# Patient Record
Sex: Female | Born: 1955 | Race: Black or African American | Hispanic: No | Smoking: Former smoker
Health system: Southern US, Community
[De-identification: ages and names within clinical notes are randomized; demographics above are authoritative.]

## PROBLEM LIST (undated history)

## (undated) DIAGNOSIS — G4733 Obstructive sleep apnea (adult) (pediatric): Secondary | ICD-10-CM

## (undated) DIAGNOSIS — R0600 Dyspnea, unspecified: Secondary | ICD-10-CM

## (undated) DIAGNOSIS — I5022 Chronic systolic (congestive) heart failure: Secondary | ICD-10-CM

## (undated) DIAGNOSIS — E079 Disorder of thyroid, unspecified: Secondary | ICD-10-CM

## (undated) DIAGNOSIS — E119 Type 2 diabetes mellitus without complications: Secondary | ICD-10-CM

## (undated) DIAGNOSIS — I10 Essential (primary) hypertension: Secondary | ICD-10-CM

## (undated) DIAGNOSIS — K1379 Other lesions of oral mucosa: Secondary | ICD-10-CM

## (undated) DIAGNOSIS — M1990 Unspecified osteoarthritis, unspecified site: Secondary | ICD-10-CM

## (undated) DIAGNOSIS — I447 Left bundle-branch block, unspecified: Secondary | ICD-10-CM

## (undated) DIAGNOSIS — I2510 Atherosclerotic heart disease of native coronary artery without angina pectoris: Secondary | ICD-10-CM

## (undated) HISTORY — DX: Type 2 diabetes mellitus without complications: E11.9

## (undated) HISTORY — PX: THYROID SURGERY: SHX805

## (undated) HISTORY — PX: ABDOMINAL HYSTERECTOMY: SHX81

---

## 1999-08-06 ENCOUNTER — Emergency Department (HOSPITAL_COMMUNITY): Admission: EM | Admit: 1999-08-06 | Discharge: 1999-08-06 | Payer: Self-pay | Admitting: Emergency Medicine

## 1999-08-21 ENCOUNTER — Encounter: Payer: Self-pay | Admitting: Emergency Medicine

## 1999-08-21 ENCOUNTER — Emergency Department (HOSPITAL_COMMUNITY): Admission: EM | Admit: 1999-08-21 | Discharge: 1999-08-21 | Payer: Self-pay | Admitting: Emergency Medicine

## 1999-10-07 ENCOUNTER — Emergency Department (HOSPITAL_COMMUNITY): Admission: EM | Admit: 1999-10-07 | Discharge: 1999-10-07 | Payer: Self-pay | Admitting: *Deleted

## 1999-10-27 ENCOUNTER — Encounter: Admission: RE | Admit: 1999-10-27 | Discharge: 1999-10-27 | Payer: Self-pay | Admitting: Obstetrics

## 1999-10-27 ENCOUNTER — Other Ambulatory Visit: Admission: RE | Admit: 1999-10-27 | Discharge: 1999-10-27 | Payer: Self-pay | Admitting: *Deleted

## 1999-10-27 ENCOUNTER — Encounter (INDEPENDENT_AMBULATORY_CARE_PROVIDER_SITE_OTHER): Payer: Self-pay | Admitting: Infectious Diseases

## 1999-11-09 ENCOUNTER — Encounter (INDEPENDENT_AMBULATORY_CARE_PROVIDER_SITE_OTHER): Payer: Self-pay | Admitting: Infectious Diseases

## 1999-11-09 LAB — CONVERTED CEMR LAB: Pap Smear: NORMAL

## 2000-01-10 ENCOUNTER — Encounter: Payer: Self-pay | Admitting: Emergency Medicine

## 2000-01-10 ENCOUNTER — Emergency Department (HOSPITAL_COMMUNITY): Admission: EM | Admit: 2000-01-10 | Discharge: 2000-01-10 | Payer: Self-pay | Admitting: Emergency Medicine

## 2000-04-12 ENCOUNTER — Encounter: Admission: RE | Admit: 2000-04-12 | Discharge: 2000-04-12 | Payer: Self-pay | Admitting: Internal Medicine

## 2000-04-15 ENCOUNTER — Emergency Department (HOSPITAL_COMMUNITY): Admission: EM | Admit: 2000-04-15 | Discharge: 2000-04-15 | Payer: Self-pay | Admitting: Emergency Medicine

## 2000-05-17 ENCOUNTER — Encounter: Admission: RE | Admit: 2000-05-17 | Discharge: 2000-05-17 | Payer: Self-pay | Admitting: Internal Medicine

## 2000-06-22 ENCOUNTER — Encounter: Admission: RE | Admit: 2000-06-22 | Discharge: 2000-06-22 | Payer: Self-pay | Admitting: Internal Medicine

## 2000-07-10 ENCOUNTER — Encounter: Admission: RE | Admit: 2000-07-10 | Discharge: 2000-07-10 | Payer: Self-pay | Admitting: *Deleted

## 2000-08-03 ENCOUNTER — Emergency Department (HOSPITAL_COMMUNITY): Admission: EM | Admit: 2000-08-03 | Discharge: 2000-08-03 | Payer: Self-pay | Admitting: *Deleted

## 2000-08-04 ENCOUNTER — Encounter: Payer: Self-pay | Admitting: Emergency Medicine

## 2000-08-09 ENCOUNTER — Encounter: Admission: RE | Admit: 2000-08-09 | Discharge: 2000-08-09 | Payer: Self-pay | Admitting: Hematology and Oncology

## 2000-10-25 ENCOUNTER — Encounter: Payer: Self-pay | Admitting: Emergency Medicine

## 2000-10-25 ENCOUNTER — Emergency Department (HOSPITAL_COMMUNITY): Admission: EM | Admit: 2000-10-25 | Discharge: 2000-10-25 | Payer: Self-pay

## 2000-11-23 ENCOUNTER — Encounter: Admission: RE | Admit: 2000-11-23 | Discharge: 2000-11-23 | Payer: Self-pay | Admitting: Internal Medicine

## 2001-02-15 ENCOUNTER — Encounter: Admission: RE | Admit: 2001-02-15 | Discharge: 2001-02-15 | Payer: Self-pay | Admitting: Internal Medicine

## 2001-04-03 ENCOUNTER — Encounter: Payer: Self-pay | Admitting: Emergency Medicine

## 2001-04-03 ENCOUNTER — Emergency Department (HOSPITAL_COMMUNITY): Admission: EM | Admit: 2001-04-03 | Discharge: 2001-04-03 | Payer: Self-pay | Admitting: Emergency Medicine

## 2001-05-01 ENCOUNTER — Encounter: Admission: RE | Admit: 2001-05-01 | Discharge: 2001-05-01 | Payer: Self-pay | Admitting: Internal Medicine

## 2001-05-20 ENCOUNTER — Encounter: Admission: RE | Admit: 2001-05-20 | Discharge: 2001-05-20 | Payer: Self-pay | Admitting: Internal Medicine

## 2001-05-23 ENCOUNTER — Encounter: Admission: RE | Admit: 2001-05-23 | Discharge: 2001-05-23 | Payer: Self-pay | Admitting: Internal Medicine

## 2001-05-30 ENCOUNTER — Ambulatory Visit (HOSPITAL_COMMUNITY): Admission: RE | Admit: 2001-05-30 | Discharge: 2001-05-30 | Payer: Self-pay | Admitting: *Deleted

## 2001-06-05 ENCOUNTER — Encounter: Admission: RE | Admit: 2001-06-05 | Discharge: 2001-06-05 | Payer: Self-pay | Admitting: Internal Medicine

## 2001-06-25 ENCOUNTER — Encounter: Admission: RE | Admit: 2001-06-25 | Discharge: 2001-06-25 | Payer: Self-pay | Admitting: Internal Medicine

## 2001-08-09 ENCOUNTER — Encounter: Payer: Self-pay | Admitting: Emergency Medicine

## 2001-08-09 ENCOUNTER — Emergency Department (HOSPITAL_COMMUNITY): Admission: EM | Admit: 2001-08-09 | Discharge: 2001-08-09 | Payer: Self-pay | Admitting: Emergency Medicine

## 2001-09-06 ENCOUNTER — Encounter: Admission: RE | Admit: 2001-09-06 | Discharge: 2001-09-06 | Payer: Self-pay | Admitting: Internal Medicine

## 2001-11-30 ENCOUNTER — Emergency Department (HOSPITAL_COMMUNITY): Admission: EM | Admit: 2001-11-30 | Discharge: 2001-12-01 | Payer: Self-pay | Admitting: Emergency Medicine

## 2001-12-05 ENCOUNTER — Encounter: Admission: RE | Admit: 2001-12-05 | Discharge: 2001-12-05 | Payer: Self-pay

## 2002-01-07 ENCOUNTER — Encounter: Admission: RE | Admit: 2002-01-07 | Discharge: 2002-01-07 | Payer: Self-pay | Admitting: Internal Medicine

## 2002-01-15 ENCOUNTER — Encounter: Payer: Self-pay | Admitting: General Surgery

## 2002-01-15 ENCOUNTER — Ambulatory Visit (HOSPITAL_COMMUNITY): Admission: RE | Admit: 2002-01-15 | Discharge: 2002-01-15 | Payer: Self-pay | Admitting: General Surgery

## 2002-02-14 ENCOUNTER — Encounter: Admission: RE | Admit: 2002-02-14 | Discharge: 2002-02-14 | Payer: Self-pay

## 2002-03-19 ENCOUNTER — Ambulatory Visit (HOSPITAL_COMMUNITY): Admission: RE | Admit: 2002-03-19 | Discharge: 2002-03-21 | Payer: Self-pay | Admitting: General Surgery

## 2002-03-19 ENCOUNTER — Encounter (INDEPENDENT_AMBULATORY_CARE_PROVIDER_SITE_OTHER): Payer: Self-pay | Admitting: Specialist

## 2002-05-14 ENCOUNTER — Encounter: Admission: RE | Admit: 2002-05-14 | Discharge: 2002-05-14 | Payer: Self-pay | Admitting: Internal Medicine

## 2002-06-24 ENCOUNTER — Encounter: Admission: RE | Admit: 2002-06-24 | Discharge: 2002-06-24 | Payer: Self-pay | Admitting: Internal Medicine

## 2002-07-04 ENCOUNTER — Ambulatory Visit (HOSPITAL_COMMUNITY): Admission: RE | Admit: 2002-07-04 | Discharge: 2002-07-04 | Payer: Self-pay | Admitting: Internal Medicine

## 2002-09-02 ENCOUNTER — Encounter: Admission: RE | Admit: 2002-09-02 | Discharge: 2002-09-02 | Payer: Self-pay | Admitting: Internal Medicine

## 2002-11-02 ENCOUNTER — Encounter: Payer: Self-pay | Admitting: Emergency Medicine

## 2002-11-02 ENCOUNTER — Emergency Department (HOSPITAL_COMMUNITY): Admission: EM | Admit: 2002-11-02 | Discharge: 2002-11-02 | Payer: Self-pay | Admitting: Emergency Medicine

## 2002-11-03 ENCOUNTER — Encounter: Payer: Self-pay | Admitting: Emergency Medicine

## 2002-11-03 ENCOUNTER — Inpatient Hospital Stay (HOSPITAL_COMMUNITY): Admission: EM | Admit: 2002-11-03 | Discharge: 2002-11-05 | Payer: Self-pay | Admitting: Emergency Medicine

## 2002-11-04 ENCOUNTER — Encounter: Payer: Self-pay | Admitting: Infectious Diseases

## 2002-11-05 ENCOUNTER — Encounter: Payer: Self-pay | Admitting: Infectious Diseases

## 2002-11-12 ENCOUNTER — Encounter: Admission: RE | Admit: 2002-11-12 | Discharge: 2002-11-12 | Payer: Self-pay | Admitting: Internal Medicine

## 2003-02-02 ENCOUNTER — Encounter: Admission: RE | Admit: 2003-02-02 | Discharge: 2003-02-02 | Payer: Self-pay | Admitting: Internal Medicine

## 2003-07-16 ENCOUNTER — Encounter: Admission: RE | Admit: 2003-07-16 | Discharge: 2003-07-16 | Payer: Self-pay | Admitting: Internal Medicine

## 2003-07-22 ENCOUNTER — Encounter: Admission: RE | Admit: 2003-07-22 | Discharge: 2003-07-22 | Payer: Self-pay | Admitting: Infectious Diseases

## 2003-07-31 ENCOUNTER — Ambulatory Visit (HOSPITAL_COMMUNITY): Admission: RE | Admit: 2003-07-31 | Discharge: 2003-07-31 | Payer: Self-pay | Admitting: Internal Medicine

## 2003-08-03 ENCOUNTER — Ambulatory Visit (HOSPITAL_BASED_OUTPATIENT_CLINIC_OR_DEPARTMENT_OTHER): Admission: RE | Admit: 2003-08-03 | Discharge: 2003-08-03 | Payer: Self-pay | Admitting: Internal Medicine

## 2003-08-04 ENCOUNTER — Emergency Department (HOSPITAL_COMMUNITY): Admission: EM | Admit: 2003-08-04 | Discharge: 2003-08-04 | Payer: Self-pay | Admitting: Emergency Medicine

## 2003-09-04 ENCOUNTER — Encounter: Admission: RE | Admit: 2003-09-04 | Discharge: 2003-09-04 | Payer: Self-pay | Admitting: Internal Medicine

## 2003-09-04 ENCOUNTER — Encounter: Payer: Self-pay | Admitting: Internal Medicine

## 2003-09-04 ENCOUNTER — Ambulatory Visit (HOSPITAL_COMMUNITY): Admission: RE | Admit: 2003-09-04 | Discharge: 2003-09-04 | Payer: Self-pay | Admitting: Internal Medicine

## 2003-11-23 ENCOUNTER — Encounter: Admission: RE | Admit: 2003-11-23 | Discharge: 2003-11-23 | Payer: Self-pay | Admitting: Internal Medicine

## 2003-11-28 ENCOUNTER — Ambulatory Visit (HOSPITAL_BASED_OUTPATIENT_CLINIC_OR_DEPARTMENT_OTHER): Admission: RE | Admit: 2003-11-28 | Discharge: 2003-11-28 | Payer: Self-pay | Admitting: Internal Medicine

## 2003-12-16 ENCOUNTER — Encounter: Admission: RE | Admit: 2003-12-16 | Discharge: 2003-12-16 | Payer: Self-pay | Admitting: Internal Medicine

## 2003-12-16 ENCOUNTER — Ambulatory Visit (HOSPITAL_COMMUNITY): Admission: RE | Admit: 2003-12-16 | Discharge: 2003-12-16 | Payer: Self-pay | Admitting: Internal Medicine

## 2004-01-06 ENCOUNTER — Emergency Department (HOSPITAL_COMMUNITY): Admission: EM | Admit: 2004-01-06 | Discharge: 2004-01-07 | Payer: Self-pay | Admitting: Emergency Medicine

## 2004-03-10 ENCOUNTER — Encounter: Admission: RE | Admit: 2004-03-10 | Discharge: 2004-03-10 | Payer: Self-pay | Admitting: Internal Medicine

## 2004-03-17 ENCOUNTER — Encounter: Admission: RE | Admit: 2004-03-17 | Discharge: 2004-06-15 | Payer: Self-pay | Admitting: Internal Medicine

## 2004-04-21 ENCOUNTER — Encounter: Admission: RE | Admit: 2004-04-21 | Discharge: 2004-04-21 | Payer: Self-pay | Admitting: Internal Medicine

## 2004-08-08 ENCOUNTER — Encounter: Admission: RE | Admit: 2004-08-08 | Discharge: 2004-08-08 | Payer: Self-pay | Admitting: Internal Medicine

## 2004-08-08 ENCOUNTER — Ambulatory Visit (HOSPITAL_COMMUNITY): Admission: RE | Admit: 2004-08-08 | Discharge: 2004-08-08 | Payer: Self-pay | Admitting: Internal Medicine

## 2004-09-01 ENCOUNTER — Emergency Department (HOSPITAL_COMMUNITY): Admission: EM | Admit: 2004-09-01 | Discharge: 2004-09-02 | Payer: Self-pay | Admitting: Emergency Medicine

## 2004-12-06 ENCOUNTER — Ambulatory Visit: Payer: Self-pay | Admitting: Internal Medicine

## 2004-12-10 ENCOUNTER — Emergency Department (HOSPITAL_COMMUNITY): Admission: EM | Admit: 2004-12-10 | Discharge: 2004-12-10 | Payer: Self-pay | Admitting: Emergency Medicine

## 2004-12-20 ENCOUNTER — Ambulatory Visit: Payer: Self-pay | Admitting: Internal Medicine

## 2005-04-14 ENCOUNTER — Ambulatory Visit: Payer: Self-pay | Admitting: Internal Medicine

## 2005-04-20 ENCOUNTER — Encounter: Admission: RE | Admit: 2005-04-20 | Discharge: 2005-05-05 | Payer: Self-pay | Admitting: Internal Medicine

## 2005-04-20 ENCOUNTER — Ambulatory Visit (HOSPITAL_COMMUNITY): Admission: RE | Admit: 2005-04-20 | Discharge: 2005-04-20 | Payer: Self-pay | Admitting: Internal Medicine

## 2005-05-31 ENCOUNTER — Ambulatory Visit: Payer: Self-pay | Admitting: Internal Medicine

## 2005-06-02 ENCOUNTER — Encounter: Admission: RE | Admit: 2005-06-02 | Discharge: 2005-06-02 | Payer: Self-pay | Admitting: Internal Medicine

## 2005-06-02 ENCOUNTER — Encounter (INDEPENDENT_AMBULATORY_CARE_PROVIDER_SITE_OTHER): Payer: Self-pay | Admitting: Infectious Diseases

## 2005-06-07 ENCOUNTER — Encounter: Admission: RE | Admit: 2005-06-07 | Discharge: 2005-06-07 | Payer: Self-pay | Admitting: Gastroenterology

## 2005-06-11 ENCOUNTER — Emergency Department (HOSPITAL_COMMUNITY): Admission: EM | Admit: 2005-06-11 | Discharge: 2005-06-11 | Payer: Self-pay | Admitting: Emergency Medicine

## 2005-06-13 ENCOUNTER — Emergency Department (HOSPITAL_COMMUNITY): Admission: EM | Admit: 2005-06-13 | Discharge: 2005-06-13 | Payer: Self-pay | Admitting: Emergency Medicine

## 2005-06-14 ENCOUNTER — Ambulatory Visit: Payer: Self-pay | Admitting: Internal Medicine

## 2005-07-02 ENCOUNTER — Emergency Department (HOSPITAL_COMMUNITY): Admission: EM | Admit: 2005-07-02 | Discharge: 2005-07-02 | Payer: Self-pay | Admitting: Emergency Medicine

## 2005-08-15 ENCOUNTER — Ambulatory Visit (HOSPITAL_COMMUNITY): Admission: RE | Admit: 2005-08-15 | Discharge: 2005-08-15 | Payer: Self-pay | Admitting: Gastroenterology

## 2005-09-02 ENCOUNTER — Emergency Department (HOSPITAL_COMMUNITY): Admission: EM | Admit: 2005-09-02 | Discharge: 2005-09-03 | Payer: Self-pay | Admitting: Emergency Medicine

## 2005-09-26 ENCOUNTER — Ambulatory Visit: Payer: Self-pay | Admitting: Internal Medicine

## 2005-09-26 ENCOUNTER — Encounter (INDEPENDENT_AMBULATORY_CARE_PROVIDER_SITE_OTHER): Payer: Self-pay | Admitting: Infectious Diseases

## 2005-10-25 ENCOUNTER — Encounter (INDEPENDENT_AMBULATORY_CARE_PROVIDER_SITE_OTHER): Payer: Self-pay | Admitting: Specialist

## 2005-10-25 ENCOUNTER — Ambulatory Visit (HOSPITAL_COMMUNITY): Admission: RE | Admit: 2005-10-25 | Discharge: 2005-10-25 | Payer: Self-pay | Admitting: Gastroenterology

## 2005-12-06 ENCOUNTER — Ambulatory Visit (HOSPITAL_COMMUNITY): Admission: RE | Admit: 2005-12-06 | Discharge: 2005-12-06 | Payer: Self-pay | Admitting: Internal Medicine

## 2005-12-06 ENCOUNTER — Ambulatory Visit: Payer: Self-pay | Admitting: Internal Medicine

## 2006-01-09 ENCOUNTER — Ambulatory Visit: Payer: Self-pay | Admitting: Hospitalist

## 2006-01-13 ENCOUNTER — Inpatient Hospital Stay (HOSPITAL_COMMUNITY): Admission: AD | Admit: 2006-01-13 | Discharge: 2006-01-13 | Payer: Self-pay | Admitting: Obstetrics & Gynecology

## 2006-01-19 ENCOUNTER — Ambulatory Visit: Payer: Self-pay | Admitting: Internal Medicine

## 2006-02-02 ENCOUNTER — Emergency Department (HOSPITAL_COMMUNITY): Admission: EM | Admit: 2006-02-02 | Discharge: 2006-02-02 | Payer: Self-pay | Admitting: Emergency Medicine

## 2006-02-05 ENCOUNTER — Ambulatory Visit: Payer: Self-pay | Admitting: Internal Medicine

## 2006-05-17 ENCOUNTER — Emergency Department (HOSPITAL_COMMUNITY): Admission: EM | Admit: 2006-05-17 | Discharge: 2006-05-17 | Payer: Self-pay | Admitting: *Deleted

## 2006-05-19 ENCOUNTER — Emergency Department (HOSPITAL_COMMUNITY): Admission: EM | Admit: 2006-05-19 | Discharge: 2006-05-19 | Payer: Self-pay | Admitting: Family Medicine

## 2006-10-04 ENCOUNTER — Encounter (INDEPENDENT_AMBULATORY_CARE_PROVIDER_SITE_OTHER): Payer: Self-pay | Admitting: Infectious Diseases

## 2006-10-04 DIAGNOSIS — N951 Menopausal and female climacteric states: Secondary | ICD-10-CM | POA: Insufficient documentation

## 2006-10-04 DIAGNOSIS — I10 Essential (primary) hypertension: Secondary | ICD-10-CM | POA: Insufficient documentation

## 2006-10-04 DIAGNOSIS — E039 Hypothyroidism, unspecified: Secondary | ICD-10-CM | POA: Insufficient documentation

## 2006-10-04 DIAGNOSIS — G5600 Carpal tunnel syndrome, unspecified upper limb: Secondary | ICD-10-CM | POA: Insufficient documentation

## 2006-10-04 DIAGNOSIS — G4730 Sleep apnea, unspecified: Secondary | ICD-10-CM | POA: Insufficient documentation

## 2006-10-04 DIAGNOSIS — L919 Hypertrophic disorder of the skin, unspecified: Secondary | ICD-10-CM

## 2006-10-04 DIAGNOSIS — F17200 Nicotine dependence, unspecified, uncomplicated: Secondary | ICD-10-CM | POA: Insufficient documentation

## 2006-10-04 DIAGNOSIS — M25569 Pain in unspecified knee: Secondary | ICD-10-CM | POA: Insufficient documentation

## 2006-10-04 DIAGNOSIS — E669 Obesity, unspecified: Secondary | ICD-10-CM | POA: Insufficient documentation

## 2006-10-04 DIAGNOSIS — K219 Gastro-esophageal reflux disease without esophagitis: Secondary | ICD-10-CM | POA: Insufficient documentation

## 2006-10-04 DIAGNOSIS — R609 Edema, unspecified: Secondary | ICD-10-CM | POA: Insufficient documentation

## 2006-10-04 DIAGNOSIS — L909 Atrophic disorder of skin, unspecified: Secondary | ICD-10-CM | POA: Insufficient documentation

## 2006-12-09 ENCOUNTER — Emergency Department (HOSPITAL_COMMUNITY): Admission: EM | Admit: 2006-12-09 | Discharge: 2006-12-09 | Payer: Self-pay | Admitting: Family Medicine

## 2006-12-13 DIAGNOSIS — Z9089 Acquired absence of other organs: Secondary | ICD-10-CM | POA: Insufficient documentation

## 2006-12-13 DIAGNOSIS — Z9889 Other specified postprocedural states: Secondary | ICD-10-CM | POA: Insufficient documentation

## 2006-12-13 DIAGNOSIS — K2990 Gastroduodenitis, unspecified, without bleeding: Secondary | ICD-10-CM

## 2006-12-13 DIAGNOSIS — Z8719 Personal history of other diseases of the digestive system: Secondary | ICD-10-CM | POA: Insufficient documentation

## 2006-12-13 DIAGNOSIS — K2970 Gastritis, unspecified, without bleeding: Secondary | ICD-10-CM | POA: Insufficient documentation

## 2007-03-05 ENCOUNTER — Emergency Department (HOSPITAL_COMMUNITY): Admission: EM | Admit: 2007-03-05 | Discharge: 2007-03-05 | Payer: Self-pay | Admitting: Emergency Medicine

## 2007-03-15 ENCOUNTER — Ambulatory Visit: Payer: Self-pay | Admitting: Cardiology

## 2008-03-22 ENCOUNTER — Emergency Department (HOSPITAL_COMMUNITY): Admission: EM | Admit: 2008-03-22 | Discharge: 2008-03-22 | Payer: Self-pay | Admitting: Emergency Medicine

## 2008-09-26 ENCOUNTER — Emergency Department (HOSPITAL_COMMUNITY): Admission: EM | Admit: 2008-09-26 | Discharge: 2008-09-26 | Payer: Self-pay | Admitting: Emergency Medicine

## 2008-12-17 ENCOUNTER — Emergency Department (HOSPITAL_COMMUNITY): Admission: EM | Admit: 2008-12-17 | Discharge: 2008-12-17 | Payer: Self-pay | Admitting: Family Medicine

## 2009-01-17 ENCOUNTER — Emergency Department (HOSPITAL_COMMUNITY): Admission: EM | Admit: 2009-01-17 | Discharge: 2009-01-17 | Payer: Self-pay | Admitting: Emergency Medicine

## 2009-05-04 ENCOUNTER — Emergency Department (HOSPITAL_COMMUNITY): Admission: EM | Admit: 2009-05-04 | Discharge: 2009-05-04 | Payer: Self-pay | Admitting: Family Medicine

## 2009-05-07 ENCOUNTER — Emergency Department (HOSPITAL_COMMUNITY): Admission: EM | Admit: 2009-05-07 | Discharge: 2009-05-07 | Payer: Self-pay | Admitting: Emergency Medicine

## 2009-10-06 ENCOUNTER — Emergency Department (HOSPITAL_COMMUNITY): Admission: EM | Admit: 2009-10-06 | Discharge: 2009-10-06 | Payer: Self-pay | Admitting: Family Medicine

## 2009-11-30 ENCOUNTER — Encounter: Admission: RE | Admit: 2009-11-30 | Discharge: 2009-11-30 | Payer: Self-pay | Admitting: Family Medicine

## 2010-03-08 ENCOUNTER — Encounter: Admission: RE | Admit: 2010-03-08 | Discharge: 2010-03-08 | Payer: Self-pay | Admitting: Family Medicine

## 2011-01-01 ENCOUNTER — Encounter: Payer: Self-pay | Admitting: Gastroenterology

## 2011-01-01 ENCOUNTER — Encounter: Payer: Self-pay | Admitting: Family Medicine

## 2011-02-28 ENCOUNTER — Emergency Department (HOSPITAL_COMMUNITY)
Admission: EM | Admit: 2011-02-28 | Discharge: 2011-02-28 | Disposition: A | Discharge status: Home / Self Care | Payer: Medicare Other | Attending: Emergency Medicine | Admitting: Emergency Medicine

## 2011-02-28 DIAGNOSIS — R7309 Other abnormal glucose: Secondary | ICD-10-CM | POA: Insufficient documentation

## 2011-02-28 DIAGNOSIS — R319 Hematuria, unspecified: Secondary | ICD-10-CM | POA: Insufficient documentation

## 2011-02-28 DIAGNOSIS — R350 Frequency of micturition: Secondary | ICD-10-CM | POA: Insufficient documentation

## 2011-02-28 DIAGNOSIS — K219 Gastro-esophageal reflux disease without esophagitis: Secondary | ICD-10-CM | POA: Insufficient documentation

## 2011-02-28 DIAGNOSIS — R300 Dysuria: Secondary | ICD-10-CM | POA: Insufficient documentation

## 2011-02-28 DIAGNOSIS — R3915 Urgency of urination: Secondary | ICD-10-CM | POA: Insufficient documentation

## 2011-02-28 DIAGNOSIS — I10 Essential (primary) hypertension: Secondary | ICD-10-CM | POA: Insufficient documentation

## 2011-02-28 DIAGNOSIS — Z79899 Other long term (current) drug therapy: Secondary | ICD-10-CM | POA: Insufficient documentation

## 2011-02-28 DIAGNOSIS — E039 Hypothyroidism, unspecified: Secondary | ICD-10-CM | POA: Insufficient documentation

## 2011-02-28 LAB — URINALYSIS, ROUTINE W REFLEX MICROSCOPIC
Glucose, UA: 500 mg/dL — AB
Hgb urine dipstick: NEGATIVE
Nitrite: NEGATIVE
Specific Gravity, Urine: 1.02 (ref 1.005–1.030)
pH: 6.5 (ref 5.0–8.0)

## 2011-03-01 LAB — URINE CULTURE: Culture  Setup Time: 201203202147

## 2011-03-21 LAB — URINALYSIS, ROUTINE W REFLEX MICROSCOPIC
Bilirubin Urine: NEGATIVE
Ketones, ur: NEGATIVE mg/dL
pH: 6 (ref 5.0–8.0)

## 2011-04-28 NOTE — Op Note (Signed)
NAMEEVOLEHT, KEILLOR NO.:  [id number]   MEDICAL RECORD NO.:  [id number]          PATIENT TYPE:  AMB   LOCATION:  ENDO                         FACILITY:  [facility]   PHYSICIAN:  Wonda Horner, M.D.   DATE OF BIRTH:  1956/10/31   DATE OF PROCEDURE:  10/25/2005  DATE OF DISCHARGE:                                 OPERATIVE REPORT   PROCEDURE:  Esophagogastroduodenoscopy with biopsy.   INDICATION:  The patient complains of symptoms of reflux.  A prior barium  swallow showed a small outpouching of contrast along the posterior margin of  the distal esophagus which might represent an esophageal ulcer.  Endoscopy  is being done to further evaluate.   PREMEDICATION:  Fentanyl 70 mcg IV, Versed 5 mg IV.   PROCEDURE:  With the patient in the left lateral decubitus position, the  Olympus gastroscope was inserted into the oropharynx and passed into the  esophagus.  It was advanced down the esophagus then into the stomach and  into the duodenum.  The second portion and bulb of the duodenum looked  normal.  The stomach showed a diffuse erythematous appearance to the mucosa  compatible with gastritis, and biopsies were obtained for histology and to  look for evidence of Helicobacter pylori.  The fundus and cardia was  unremarkable except for gastritis.  There was a small hiatal hernia.  The  esophagogastric junction was at 38 cm.  The esophagus looked normal.  I saw  no evidence of ulcer or other abnormality.  She tolerated the procedure well  without complications.   IMPRESSION:  1.  Gastritis.  2.  Hiatal hernia.   PLAN:  The biopsies will be checked.  The patient will remain on a proton  pump inhibitor.  I think that her gastroesophageal reflux might improve if  she loses weight.  She is 305 pounds.  The patient will follow-up with Dr.  Merton Border.           ______________________________  Wonda Horner, M.D.     SFG/MEDQ  D:  10/25/2005  T:  10/25/2005  Job:   [id number]   cc:   Logan Bores, M.D.  Fax: [phone]

## 2011-04-28 NOTE — Discharge Summary (Signed)
NAMECELESE, UNDERKOFFLER NO.:  [id number]   MEDICAL RECORD NO.:  [id number]                   PATIENT TYPE:  INP   LOCATION:  5702                                 FACILITY:  [facility]   PHYSICIAN:  Sharlet Salina, M.D.                DATE OF BIRTH:  03/03/1956   DATE OF ADMISSION:  11/03/2002  DATE OF DISCHARGE:  11/05/2002                                 DISCHARGE SUMMARY   PRIMARY CARE PHYSICIAN:  Outpatient [facility] at [facility], Dr.  Theodis Sato   DISCHARGE DIAGNOSES:  1. Febrile illness.  2. Right lung pneumonia.  3. Hypothyroidism.  4. On-going tobacco abuse.  5. Postmenopausal, status post total abdominal hysterectomy in 1996.  6. Obesity.   DISCHARGE MEDICATIONS:  1. Synthroid 150 mcg p.o. q.d.  2. Albuterol MDI 2 puffs q.4h. p.r.n.  3. Doxycycline 100 mg p.o. b.i.d. x10d.  4. Tylenol as needed for fever.  5. Ibuprofen as needed for aches.  6. Tussionex 5 mL  p.o. b.i.d. p.r.n.   DISPOSITION:  The patient was discharged home with condition improved.   DISCHARGE INSTRUCTIONS:  The patient was instructed to use medications as  prescribed above.   FOLLOW UP:  The patient was instructed to follow up the outpatient clinic on  Wednesday, December 4.   REASON FOR ADMISSION:  The patient is a 55 year old African-American woman  who was in a normal state of health until one day prior to admission.  She  complained of aching all over in all of her muscles, headache, dyspnea,  fever, chills, and anorexia.  She was seen in the ED one day prior to  admission and diagnosed with bronchitis and discharged on albuterol.  In the  emergency department on the day of admission her temperature was 103.9.  She  was sating 91% on 2 liters of oxygen.  Heart rate was 137.  Lungs showed  poor air movement but were without rhonchi or wheeze.  The rest of the exam  was unremarkable.  Given the patient's persistent fever and complaint of  dyspnea, she was  admitted to the hospital for further workup and management  of her febrile illness.   HOSPITAL COURSE:  The patient's hospital course was unremarkable.  She  continued to spike fevers for the first 24 hours after admission.  Cultures  were sent and nasal washings sent for influenza A and B as well as the RSV  virus.  The patient stated that she had not received influenza virus nor had  she received a pneumococcal virus.  Both of these tests came back negative.  It is presumed that patient had a viral illness and we continued to treat  her symptomatically with Tylenol for pain, Tussionex for cough, albuterol  and Atrovent inhalers p.r.n. for dyspnea.  The patient was placed on  respiratory isolation for duration of her inpatient stay on the presumption  that her infection was likely spread by respiratory means.  On the day of  discharge the patient stated that she felt better.  Exam was unremarkable  except for a few scattered wheezes in the right lung field.  A chest x-ray  at this time showed the possibility of a developing infiltrate in the right  lobe.  The patient had been afebrile for 24 hours prior to discharge.  The  patient was discharged after being afebrile for 24 hours, reporting feeling  better, sating greater than 94% consistently on room air, tolerating p.o.  intake, and ambulating without desaturation.  She was sent home on a 10-day  course of Doxycycline in the case that she was developing a right lobe  pneumonia.  She was instructed to follow up in the outpatient clinic in one  week.   CONDITION UPON DISCHARGE:  Improved, stable.   ATTENDING PHYSICIAN:  Alison Murray, M.D.   RESIDENT PHYSICIAN:  Sharlet Salina, M.D.   ACTING INTERN:  Burney Gauze, MS4     Burney Gauze, MS4, [facility]          Sharlet Salina, M.D.    AS/MEDQ  D:  11/07/2002  T:  11/07/2002  Job:  [id number]   cc:   Idamae Schuller, M.D.  [facility] Resident - Internal Med.  [place],  [place] [phone]  Fax: [phone]

## 2011-04-28 NOTE — Op Note (Signed)
[place]. [facility]  Patient:    DARSHELL, ORIO Visit Number: [id number] MRN: [id number]          Service Type: DSU Location: [facility] 2899 21 Attending Physician:  Marlan Palau Dictated by:   Darene Lamer. Hoxworth, M.D. Proc. Date: 03/19/02 Admit Date:  03/19/2002                             Operative Report  PREOPERATIVE DIAGNOSIS:  Multinodular goiter.  POSTOPERATIVE DIAGNOSIS:  Multinodular goiter.  OPERATION PERFORMED:  Total thyroidectomy.  SURGEON:  Darene Lamer. Hoxworth, M.D.  ASSISTANT:  Autumn Messing, M.D.  ANESTHESIA:  General.  INDICATIONS FOR PROCEDURE:  The patient is a 55 year old black female who presents with a large symptomatic goiter with complaints of neck pressure and difficulty swallowing.  She has been adequate thyroid suppression with follow-up ultrasound showing enlargement of the goiter and she is increasingly symptomatic.  Total thyroidectomy has been recommended and accepted.  The nature of the procedure, its indications and risks of bleeding, infection, permanent hypocalcemia and recurrent laryngeal nerve injury with permanent hoarseness have been discussed and understood preoperatively.  She is now brought to the operating room for this procedure.  DESCRIPTION OF PROCEDURE:  The patient was brought to the operating room and placed in supine position on the operating table and general endotracheal anesthesia was induced.  She was carefully positioned with her neck extended and the neck and chest were sterilely prepped and draped.  A curvilinear incision was made in a skin crease halfway between the sternal notch and thyroid cartilage.  Dissection was carried down through the subcutaneous tissues on the platysma using cautery.  The subplatysmal flaps were raised superiorly to the thyroid cartilage, inferior to the sternal notch and laterally out to the sternocleidomastoid muscles.  The Horn retractor was placed.   The strap muscles were then separated in the midline with cautery. Beginning with the left lobe, the anterior surface of the thyroid was exposed dissecting the strap muscles up off of the gland with blunt dissection and cautery.  The gland was firm with multiple small nodules and markedly enlarged.  The gland was mobilized with blunt dissection.  The inferior pole was very large.  Areolar attachments along the  inferior pole were divided with cautery and with careful blunt dissection was further mobilized up out of the neck.  Medially well away from the laryngeal nerve, small vascular attachments here were divided between clips.  After mobilizing the anterior pole, dissection progressed superiorly in the middle thyroid vein to identify, isolate it and divide between clips.  The superior pole was isolated with blunt dissection and the superior thyroid vessels skeletonized and isolated and 2-0 silk tie encircled around the vessels and they were divided between clamps and additionally tied with 2-0 silk ties.  The superior pole was further mobilized medially with cautery.  The inferior thyroid artery pedicle was identified and the gland mobilized around this.  Blunt dissection was used in the tracheoesophageal groove and the recurrent laryngeal nerve here was felt to be identified posterior to the plane of dissection.  Also identified clear superior and inferior parathyroid glands which were protected. Staying very close to the gland, individual small branches of the inferior thyroid artery were divided between clips.  As dissection progressed posteriorly near the recurrent laryngeal nerve, dissection was taken inside the capsule of the thyroid and a small amount of the thyroid was left behind  in the tracheoesophageal groove.  However, it was unable to be mobilized up medially and the suspensory ligament of Berry directly over the trachea was divided with the cautery fully mobilizing the lobe.   Dissection was then shifted to the right side.  Findings were identical and dissection progressed essentially identically here.  I was really not able to identify a definite parathyroid tissue on the right although the dissection was carried out on the capsule of the gland.  The recurrent laryngeal nerve was well identified and dissection again carried anterior to this and within the capsule near the nerve leaving a small amount of thyroid tissue here.  There was a pyramidal lobe that was mobilized up off the midline of the trachea with cautery and taken with the specimen and finally attachments along the trachea anteriorly were divided with the cautery and the specimen removed.  The neck was irrigated and inspected for hemostasis which appeared relatively complete.  Surgicel patches were placed bilaterally and dry gauze pressure applied and again inspected and there was complete hemostasis.  Following this, the strap muscles were closed in the midline with interrupted 3-0 Vicryl.  The platysma was closed with interrupted Vicryl.  The skin was closed with alternating staples and half inch Steri-Strips.  With the patient breathing spontaneously, the endotracheal tube was removed with laryngoscopic visualization the cords and appeared to be moving bilaterally.  Dry sterile dressing applied.  The patient was transferred to the recovery room in good condition having tolerated the procedure well. Dictated by:   Darene Lamer. Hoxworth, M.D. Attending Physician:  Marlan Palau DD:  03/19/02 TD:  03/19/02 Job: [id number]

## 2011-05-18 ENCOUNTER — Inpatient Hospital Stay (HOSPITAL_COMMUNITY)
Admission: AD | Admit: 2011-05-18 | Discharge: 2011-05-18 | Disposition: A | Discharge status: Home / Self Care | Payer: Medicare Other | Source: Ambulatory Visit | Attending: Obstetrics & Gynecology | Admitting: Obstetrics & Gynecology

## 2011-05-18 DIAGNOSIS — L0501 Pilonidal cyst with abscess: Secondary | ICD-10-CM | POA: Insufficient documentation

## 2011-05-18 DIAGNOSIS — K625 Hemorrhage of anus and rectum: Secondary | ICD-10-CM | POA: Insufficient documentation

## 2011-05-18 LAB — URINALYSIS, ROUTINE W REFLEX MICROSCOPIC
Glucose, UA: NEGATIVE mg/dL
Leukocytes, UA: NEGATIVE
Nitrite: NEGATIVE
Protein, ur: 30 mg/dL — AB
Urobilinogen, UA: 0.2 mg/dL (ref 0.0–1.0)
pH: 5 (ref 5.0–8.0)

## 2011-05-18 LAB — URINE MICROSCOPIC-ADD ON

## 2011-09-12 LAB — POCT URINALYSIS DIP (DEVICE)
Operator id: 235561
Specific Gravity, Urine: 1.02
Urobilinogen, UA: 0.2
pH: 5.5

## 2012-01-20 ENCOUNTER — Encounter (HOSPITAL_COMMUNITY): Payer: Self-pay

## 2012-01-20 ENCOUNTER — Emergency Department (HOSPITAL_COMMUNITY)
Admission: EM | Admit: 2012-01-20 | Discharge: 2012-01-20 | Disposition: A | Discharge status: Home / Self Care | Payer: Medicare Other | Attending: Emergency Medicine | Admitting: Emergency Medicine

## 2012-01-20 DIAGNOSIS — R05 Cough: Secondary | ICD-10-CM | POA: Insufficient documentation

## 2012-01-20 DIAGNOSIS — E119 Type 2 diabetes mellitus without complications: Secondary | ICD-10-CM | POA: Insufficient documentation

## 2012-01-20 DIAGNOSIS — R059 Cough, unspecified: Secondary | ICD-10-CM | POA: Insufficient documentation

## 2012-01-20 DIAGNOSIS — Z760 Encounter for issue of repeat prescription: Secondary | ICD-10-CM | POA: Diagnosis not present

## 2012-01-20 DIAGNOSIS — J069 Acute upper respiratory infection, unspecified: Secondary | ICD-10-CM

## 2012-01-20 DIAGNOSIS — Z79899 Other long term (current) drug therapy: Secondary | ICD-10-CM | POA: Insufficient documentation

## 2012-01-20 DIAGNOSIS — R220 Localized swelling, mass and lump, head: Secondary | ICD-10-CM | POA: Insufficient documentation

## 2012-01-20 DIAGNOSIS — I10 Essential (primary) hypertension: Secondary | ICD-10-CM | POA: Diagnosis not present

## 2012-01-20 DIAGNOSIS — J3489 Other specified disorders of nose and nasal sinuses: Secondary | ICD-10-CM | POA: Insufficient documentation

## 2012-01-20 DIAGNOSIS — E079 Disorder of thyroid, unspecified: Secondary | ICD-10-CM | POA: Insufficient documentation

## 2012-01-20 HISTORY — DX: Disorder of thyroid, unspecified: E07.9

## 2012-01-20 HISTORY — DX: Essential (primary) hypertension: I10

## 2012-01-20 MED ORDER — METOPROLOL TARTRATE 50 MG PO TABS: 50.0000 mg | ORAL_TABLET | Freq: Two times a day (BID) | ORAL | Status: DC

## 2012-01-20 MED ORDER — CETIRIZINE HCL 10 MG PO TABS: 10.0000 mg | ORAL_TABLET | Freq: Every day | ORAL | Status: DC

## 2012-01-20 NOTE — ED Provider Notes (Signed)
History     CSN: [id number]  Arrival date & time 01/20/12  [id number]   First MD Initiated Contact with Patient 01/20/12 [phone]      Chief Complaint  Patient presents with  . URI    (Consider location/radiation/quality/duration/timing/severity/associated sxs/prior treatment) HPI Comments: Patient presents with URI type symptoms including a nonproductive cough, pressure in her bilateral ears along with sore throat nasal congestion and clear nasal discharge. Symptoms began 3 days ago. She denies fevers and chills, no shortness of breath or chest pain.she also expresses concern over not currently have been a primary physician and is almost out of her metoprolol and would like a refill.  Patient is a 56 y.o. female presenting with URI. The history is provided by the patient.  URI The primary symptoms include sore throat and cough. Primary symptoms do not include fever, headaches, ear pain, abdominal pain, nausea, arthralgias or rash. This is a new problem. The problem has been gradually worsening.  Symptoms associated with the illness include congestion and rhinorrhea. The illness is not associated with facial pain or sinus pressure.    Past Medical History  Diagnosis Date  . Hypertension   . Glaucoma   . Thyroid disease   . Diabetes mellitus     Past Surgical History  Procedure Date  . Abdominal hysterectomy   . Thyroid surgery     No family history on file.  History  Substance Use Topics  . Smoking status: Never Smoker   . Smokeless tobacco: Not on file  . Alcohol Use: No    OB History    Grav Para Term Preterm Abortions TAB SAB Ect Mult Living                  Review of Systems  Constitutional: Negative for fever.  HENT: Positive for congestion, sore throat and rhinorrhea. Negative for ear pain, neck pain and sinus pressure.   Eyes: Negative.   Respiratory: Positive for cough. Negative for chest tightness and shortness of breath.   Cardiovascular: Negative for chest  pain.  Gastrointestinal: Negative for nausea and abdominal pain.  Genitourinary: Negative.   Musculoskeletal: Negative for joint swelling and arthralgias.  Skin: Negative.  Negative for rash and wound.  Neurological: Negative for dizziness, weakness, light-headedness, numbness and headaches.  Hematological: Negative.   Psychiatric/Behavioral: Negative.     Allergies  Aspirin  Home Medications   Current Outpatient Rx  Name Route Sig Dispense Refill  . LATANOPROST 0.005 % OP SOLN Both Eyes Place 1 drop into both eyes 2 (two) times daily.    Marland Kitchen LEVOTHYROXINE SODIUM 150 MCG PO TABS Oral Take 150 mcg by mouth daily.    Marland Kitchen METFORMIN HCL 500 MG PO TABS Oral Take 500 mg by mouth 2 (two) times daily with a meal.    . METOPROLOL TARTRATE 50 MG PO TABS Oral Take 50 mg by mouth daily.    Marland Kitchen TIMOLOL HEMIHYDRATE 0.5 % OP SOLN Both Eyes Place 1 drop into both eyes 2 (two) times daily.    Marland Kitchen CETIRIZINE HCL 10 MG PO TABS Oral Take 1 tablet (10 mg total) by mouth daily. For nasal drainage,  Itchy and watery eyes. 20 tablet 0  . METOPROLOL TARTRATE 50 MG PO TABS Oral Take 1 tablet (50 mg total) by mouth 2 (two) times daily. 60 tablet 0    BP 172/89  Pulse 86  Temp(Src) 98.5 F (36.9 C) (Oral)  Resp 19  Ht 5\' 5"  (1.651 m)  Wt 320  lb (145.151 kg)  BMI 53.25 kg/m2  SpO2 96%  Physical Exam  Nursing note and vitals reviewed. Constitutional: She is oriented to person, place, and time. She appears well-developed and well-nourished.  HENT:  Head: Normocephalic and atraumatic.  Right Ear: External ear normal.  Left Ear: External ear normal.  Nose: Mucosal edema and rhinorrhea present. Right sinus exhibits no maxillary sinus tenderness and no frontal sinus tenderness. Left sinus exhibits no maxillary sinus tenderness and no frontal sinus tenderness.  Mouth/Throat: Uvula is midline, oropharynx is clear and moist and mucous membranes are normal. Mucous membranes are not dry. No oropharyngeal exudate,  posterior oropharyngeal edema or posterior oropharyngeal erythema.  Eyes: Conjunctivae are normal.  Neck: Normal range of motion.  Cardiovascular: Normal rate, regular rhythm, normal heart sounds and intact distal pulses.   Pulmonary/Chest: Effort normal and breath sounds normal. She has no wheezes.  Abdominal: Soft. Bowel sounds are normal. There is no tenderness.  Musculoskeletal: Normal range of motion.  Neurological: She is alert and oriented to person, place, and time.  Skin: Skin is warm and dry.  Psychiatric: She has a normal mood and affect.    ED Course  Procedures (including critical care time)  Labs Reviewed - No data to display No results found.   1. Upper respiratory tract infection   2. Medication refill   3. Hypertension       MDM  Patient first describes Zyrtec for nasal congestion and discharge. Refill of metoprolol. Referrals given to establish local primary care.        Fulton Reek, [place] 01/20/12 1657

## 2012-01-20 NOTE — ED Notes (Signed)
Pt c/o nonproductive cough, bilateral ear ache, scratchy throat, headache, and runny nose x 2 or 3 days.

## 2012-01-20 NOTE — ED Notes (Signed)
Patient with no complaints at this time. Respirations even and unlabored. Skin warm/dry. Discharge instructions reviewed with patient at this time. Patient given opportunity to voice concerns/ask questions. Patient discharged at this time and left Emergency Department with steady gait.   

## 2012-01-20 NOTE — ED Notes (Signed)
Evalee Jefferson, PA at bedside for patient evaluation.

## 2012-01-21 NOTE — ED Provider Notes (Signed)
Medical screening examination/treatment/procedure(s) were performed by non-physician practitioner and as supervising physician I was immediately available for consultation/collaboration.   Maudry Diego, MD 01/21/12 [phone]

## 2012-05-04 ENCOUNTER — Encounter (HOSPITAL_COMMUNITY): Payer: Self-pay | Admitting: *Deleted

## 2012-05-04 ENCOUNTER — Emergency Department (HOSPITAL_COMMUNITY)
Admission: EM | Admit: 2012-05-04 | Discharge: 2012-05-04 | Disposition: A | Discharge status: Home / Self Care | Payer: Medicare Other | Attending: Emergency Medicine | Admitting: Emergency Medicine

## 2012-05-04 DIAGNOSIS — Z760 Encounter for issue of repeat prescription: Secondary | ICD-10-CM | POA: Diagnosis not present

## 2012-05-04 DIAGNOSIS — Z79899 Other long term (current) drug therapy: Secondary | ICD-10-CM | POA: Insufficient documentation

## 2012-05-04 DIAGNOSIS — B3749 Other urogenital candidiasis: Secondary | ICD-10-CM | POA: Diagnosis not present

## 2012-05-04 DIAGNOSIS — N390 Urinary tract infection, site not specified: Secondary | ICD-10-CM | POA: Diagnosis not present

## 2012-05-04 DIAGNOSIS — E079 Disorder of thyroid, unspecified: Secondary | ICD-10-CM | POA: Insufficient documentation

## 2012-05-04 DIAGNOSIS — I10 Essential (primary) hypertension: Secondary | ICD-10-CM | POA: Insufficient documentation

## 2012-05-04 DIAGNOSIS — N898 Other specified noninflammatory disorders of vagina: Secondary | ICD-10-CM | POA: Insufficient documentation

## 2012-05-04 DIAGNOSIS — B373 Candidiasis of vulva and vagina: Secondary | ICD-10-CM

## 2012-05-04 DIAGNOSIS — E119 Type 2 diabetes mellitus without complications: Secondary | ICD-10-CM | POA: Insufficient documentation

## 2012-05-04 LAB — GLUCOSE, CAPILLARY: Glucose-Capillary: 327 mg/dL — ABNORMAL HIGH (ref 70–99)

## 2012-05-04 LAB — WET PREP, GENITAL: Clue Cells Wet Prep HPF POC: NONE SEEN

## 2012-05-04 LAB — URINALYSIS, ROUTINE W REFLEX MICROSCOPIC
Ketones, ur: NEGATIVE mg/dL
Nitrite: NEGATIVE
Protein, ur: 30 mg/dL — AB
Urobilinogen, UA: 0.2 mg/dL (ref 0.0–1.0)
pH: 6 (ref 5.0–8.0)

## 2012-05-04 MED ORDER — LEVOTHYROXINE SODIUM 150 MCG PO TABS: ORAL_TABLET | ORAL | Status: DC

## 2012-05-04 MED ORDER — FLUCONAZOLE 100 MG PO TABS
150.0000 mg | ORAL_TABLET | Freq: Once | ORAL | Status: AC
  Administered 2012-05-04: 150 mg via ORAL
  Filled 2012-05-04: qty 2

## 2012-05-04 MED ORDER — NITROFURANTOIN MONOHYD MACRO 100 MG PO CAPS: 100.0000 mg | ORAL_CAPSULE | Freq: Two times a day (BID) | ORAL | Status: AC

## 2012-05-04 MED ORDER — FLUCONAZOLE 150 MG PO TABS: ORAL_TABLET | ORAL | Status: DC

## 2012-05-04 MED ORDER — METOPROLOL TARTRATE 50 MG PO TABS: 50.0000 mg | ORAL_TABLET | Freq: Two times a day (BID) | ORAL | Status: DC

## 2012-05-04 NOTE — ED Notes (Signed)
Patient states she does not need anything at this time. 

## 2012-05-04 NOTE — ED Provider Notes (Signed)
History     CSN: [id number]  Arrival date & time 05/04/12  0909   First MD Initiated Contact with Patient 05/04/12 1016      Chief Complaint  Patient presents with  . Vaginal Discharge    (Consider location/radiation/quality/duration/timing/severity/associated sxs/prior treatment) HPI Comments: Patient c/o white, thick vaginal discharge for several days.  Also c/o itching and urinary frequency.  She denies fever, abd pain, vaginal bleeding or back pain.  Patient also states that she is out of her thyroid and blood pressure medications and has not been able to see her PMD because she owes them money.    Patient is a 56 y.o. female presenting with vaginal discharge. The history is provided by the patient.  Vaginal Discharge This is a new problem. The current episode started in the past 7 days. The problem occurs constantly. The problem has been unchanged. Associated symptoms include urinary symptoms. Pertinent negatives include no abdominal pain, chest pain, chills, fever, nausea, rash, swollen glands or vomiting. Exacerbated by: urination. She has tried nothing for the symptoms. The treatment provided mild relief.    Past Medical History  Diagnosis Date  . Hypertension   . Glaucoma   . Thyroid disease   . Diabetes mellitus     Past Surgical History  Procedure Date  . Abdominal hysterectomy   . Thyroid surgery     History reviewed. No pertinent family history.  History  Substance Use Topics  . Smoking status: Never Smoker   . Smokeless tobacco: Not on file  . Alcohol Use: No    OB History    Grav Para Term Preterm Abortions TAB SAB Ect Mult Living                  Review of Systems  Constitutional: Negative for fever and chills.  Respiratory: Negative for shortness of breath.   Cardiovascular: Negative for chest pain.  Gastrointestinal: Negative for nausea, vomiting, abdominal pain and abdominal distention.  Genitourinary: Positive for frequency and vaginal  discharge. Negative for hematuria, flank pain, vaginal bleeding, difficulty urinating, vaginal pain and pelvic pain.  Musculoskeletal: Negative for back pain.  Skin: Negative for rash.  All other systems reviewed and are negative.    Allergies  Aspirin  Home Medications   Current Outpatient Rx  Name Route Sig Dispense Refill  . CETIRIZINE HCL 10 MG PO TABS Oral Take 1 tablet (10 mg total) by mouth daily. For nasal drainage,  Itchy and watery eyes. 20 tablet 0  . LATANOPROST 0.005 % OP SOLN Both Eyes Place 1 drop into both eyes 2 (two) times daily.    Marland Kitchen LEVOTHYROXINE SODIUM 150 MCG PO TABS Oral Take 150 mcg by mouth daily.    Marland Kitchen METFORMIN HCL 500 MG PO TABS Oral Take 500 mg by mouth 2 (two) times daily with a meal.    . METOPROLOL TARTRATE 50 MG PO TABS Oral Take 50 mg by mouth daily.    Marland Kitchen METOPROLOL TARTRATE 50 MG PO TABS Oral Take 1 tablet (50 mg total) by mouth 2 (two) times daily. 60 tablet 0  . TIMOLOL HEMIHYDRATE 0.5 % OP SOLN Both Eyes Place 1 drop into both eyes 2 (two) times daily.      BP 159/87  Pulse 78  Temp(Src) 97.6 F (36.4 C) (Oral)  Resp 22  Ht 5\' 5"  (1.651 m)  Wt 320 lb (145.151 kg)  BMI 53.25 kg/m2  SpO2 93%  Physical Exam  Nursing note and vitals reviewed. Constitutional: She  is oriented to person, place, and time. She appears well-developed and well-nourished. No distress.       Morbidly obese  HENT:  Head: Normocephalic and atraumatic.  Mouth/Throat: Oropharynx is clear and moist.  Cardiovascular: Normal rate, regular rhythm and normal heart sounds.   Pulmonary/Chest: Effort normal and breath sounds normal. No respiratory distress.  Abdominal: Soft. She exhibits no distension and no mass. There is no tenderness. There is no rebound.  Genitourinary: No tenderness or bleeding around the vagina. No foreign body around the vagina. No signs of injury around the vagina. Vaginal discharge found.       Thick, white discharge is present in the vaginal vault.   Patient is s/p hysterectomy.    Musculoskeletal: Normal range of motion. She exhibits no tenderness.  Neurological: She is alert and oriented to person, place, and time. She exhibits normal muscle tone. Coordination normal.  Skin: Skin is warm and dry.    ED Course  Procedures (including critical care time)  Labs Reviewed  GLUCOSE, CAPILLARY - Abnormal; Notable for the following:    Glucose-Capillary 327 (*)    All other components within normal limits  URINALYSIS, ROUTINE W REFLEX MICROSCOPIC - Abnormal; Notable for the following:    APPearance CLOUDY (*)    Specific Gravity, Urine >1.030 (*)    Glucose, UA 100 (*)    Hgb urine dipstick TRACE (*)    Protein, ur 30 (*)    Leukocytes, UA MODERATE (*)    All other components within normal limits  URINE MICROSCOPIC-ADD ON - Abnormal; Notable for the following:    Squamous Epithelial / LPF FEW (*)    Bacteria, UA MANY (*)    All other components within normal limits  WET PREP, GENITAL - Abnormal; Notable for the following:    Yeast Wet Prep HPF POC MODERATE (*)    WBC, Wet Prep HPF POC FEW (*)    All other components within normal limits  URINE CULTURE    Urine culture is pending.   MDM    Patient is alert. Vital signs are stable. She is nontoxic appearing. Abdomen remains soft and nontender. Patient's symptoms today are likely related to vaginal candidiasis and  UTI. I will treat her with Diflucan and Macrobid. She agrees to close followup with her primary care physician. She is also out of her thyroid medication and her metoprolol so I will write refills for those medications. She verbalized understanding to me that she will need close followup with her primary care physician.  The patient appears reasonably screened and/or stabilized for discharge and I doubt any other medical condition or other [facility] requiring further screening, evaluation, or treatment in the ED at this time prior to discharge.       Trestan Vahle L. [place],  [place] 05/05/12 [phone]

## 2012-05-04 NOTE — Discharge Instructions (Signed)
Candidal Vulvovaginitis Candidal vulvovaginitis is an infection of the vagina and vulva. The vulva is the skin around the opening of the vagina. This may cause itching and discomfort in and around the vagina.  HOME CARE  Only take medicine as told by your doctor.   Do not have sex (intercourse) until the infection is healed or as told by your doctor.   Practice safe sex.   Tell your sex partner about your infection.   Do not douche or use tampons.   Wear cotton underwear. Do not wear tight pants or panty hose.   Eat yogurt. This may help treat and prevent yeast infections.  GET HELP RIGHT AWAY IF:   You have a fever.   Your problems get worse during treatment or do not get better in 3 days.   You have discomfort, irritation, or itching in your vagina or vulva area.   You have pain after sex.   You start to get belly (abdominal) pain.  MAKE SURE YOU:  Understand these instructions.   Will watch your condition.   Will get help right away if you are not doing well or get worse.  Document Released: 02/23/2009 Document Revised: 11/16/2011 Document Reviewed: 02/23/2009 [facility] Patient Information 2012 [place].Medication Refill, Emergency Department We have refilled your medication today as a courtesy to you. It is best for your medical care, however, to take care of getting refills done through your primary caregiver's office. They have your records and can do a better job of follow-up than we can in the emergency department. On maintenance medications, we often only prescribe enough medications to get you by until you are able to see your regular caregiver. This is a more expensive way to refill medications. In the future, please plan for refills so that you will not have to use the emergency department for this. Thank you for your help. Your help allows [place] to better take care of the daily emergencies that enter our department. Document Released: 03/15/2004 Document  Revised: 11/16/2011 Document Reviewed: 11/27/2005 [facility] Patient Information 2012 [place].Urinary Tract Infection A urinary tract infection (UTI) is often caused by a germ (bacteria). A UTI is usually helped with medicine (antibiotics) that kills germs. Take all the medicine until it is gone. Do this even if you are feeling better. You are usually better in 7 to 10 days. HOME CARE   Drink enough water and fluids to keep your pee (urine) clear or pale yellow. Drink:   Cranberry juice.   Water.   Avoid:   Caffeine.   Tea.   Bubbly (carbonated) drinks.   Alcohol.   Only take medicine as told by your doctor.   To prevent further infections:   Pee often.   After pooping (bowel movement), women should wipe from front to back. Use each tissue only once.   Pee before and after having sex (intercourse).  Ask your doctor when your test results will be ready. Make sure you follow up and get your test results.  GET HELP RIGHT AWAY IF:   There is very bad back pain or lower belly (abdominal) pain.   You get the chills.   You have a fever.   Your baby is older than 3 months with a rectal temperature of 102 F (38.9 C) or higher.   Your baby is 78 months old or younger with a rectal temperature of 100.4 F (38 C) or higher.   You feel sick to your stomach (nauseous) or throw  up (vomit).   There is continued burning with peeing.   Your problems are not better in 3 days. Return sooner if you are getting worse.  MAKE SURE YOU:   Understand these instructions.   Will watch your condition.   Will get help right away if you are not doing well or get worse.  Document Released: 05/15/2008 Document Revised: 11/16/2011 Document Reviewed: 05/15/2008 [facility] Patient Information 2012 [place].

## 2012-05-04 NOTE — ED Notes (Signed)
Pt a/ox4. Resp even and unlabored. NAD at this time. D/ C instructions reviewed with pt. Pt verbalized understanding. Pt ambulated to d/c desk with steady gate.  

## 2012-05-04 NOTE — ED Notes (Signed)
Pt c/o vaginal itching and white discharge since Monday. Denies urinary symptoms.

## 2012-05-04 NOTE — ED Notes (Signed)
Patient is comfortable sitting up in a chair at this time. Patient is just waiting on her results at this time.

## 2012-05-04 NOTE — ED Notes (Signed)
Pt medicated per orders. NAD at this time. Pt sitting up in chair and waiting on D/C paperwork.

## 2012-05-05 NOTE — ED Provider Notes (Signed)
Medical screening examination/treatment/procedure(s) were performed by non-physician practitioner and as supervising physician I was immediately available for consultation/collaboration.   Maudry Diego, MD 05/05/12 [phone]

## 2012-05-06 LAB — URINE CULTURE: Culture  Setup Time: 201305252140

## 2012-11-26 ENCOUNTER — Emergency Department (HOSPITAL_COMMUNITY)
Admission: EM | Admit: 2012-11-26 | Discharge: 2012-11-26 | Disposition: A | Discharge status: Home / Self Care | Payer: Medicare Other | Attending: Emergency Medicine | Admitting: Emergency Medicine

## 2012-11-26 ENCOUNTER — Encounter (HOSPITAL_COMMUNITY): Payer: Self-pay

## 2012-11-26 DIAGNOSIS — E119 Type 2 diabetes mellitus without complications: Secondary | ICD-10-CM | POA: Insufficient documentation

## 2012-11-26 DIAGNOSIS — Z79899 Other long term (current) drug therapy: Secondary | ICD-10-CM | POA: Insufficient documentation

## 2012-11-26 DIAGNOSIS — E079 Disorder of thyroid, unspecified: Secondary | ICD-10-CM | POA: Diagnosis not present

## 2012-11-26 DIAGNOSIS — Z760 Encounter for issue of repeat prescription: Secondary | ICD-10-CM

## 2012-11-26 DIAGNOSIS — L0231 Cutaneous abscess of buttock: Secondary | ICD-10-CM | POA: Insufficient documentation

## 2012-11-26 DIAGNOSIS — I10 Essential (primary) hypertension: Secondary | ICD-10-CM | POA: Diagnosis not present

## 2012-11-26 DIAGNOSIS — L989 Disorder of the skin and subcutaneous tissue, unspecified: Secondary | ICD-10-CM | POA: Diagnosis not present

## 2012-11-26 DIAGNOSIS — H409 Unspecified glaucoma: Secondary | ICD-10-CM | POA: Diagnosis not present

## 2012-11-26 MED ORDER — TRIAMCINOLONE ACETONIDE 0.1 % EX CREA: TOPICAL_CREAM | Freq: Three times a day (TID) | CUTANEOUS | Status: DC

## 2012-11-26 MED ORDER — DOXYCYCLINE HYCLATE 100 MG PO CAPS: 100.0000 mg | ORAL_CAPSULE | Freq: Two times a day (BID) | ORAL | Status: DC

## 2012-11-26 MED ORDER — HYDROCORTISONE 1 % EX CREA
TOPICAL_CREAM | Freq: Once | CUTANEOUS | Status: AC
  Administered 2012-11-26: 1 via TOPICAL
  Filled 2012-11-26: qty 1.5

## 2012-11-26 MED ORDER — DOXYCYCLINE HYCLATE 100 MG PO TABS
100.0000 mg | ORAL_TABLET | Freq: Once | ORAL | Status: AC
  Administered 2012-11-26: 100 mg via ORAL
  Filled 2012-11-26: qty 1

## 2012-11-26 MED ORDER — LEVOTHYROXINE SODIUM 150 MCG PO TABS: 150.0000 ug | ORAL_TABLET | Freq: Every day | ORAL | Status: DC

## 2012-11-26 MED ORDER — METOPROLOL TARTRATE 50 MG PO TABS: 100.0000 mg | ORAL_TABLET | Freq: Two times a day (BID) | ORAL | Status: DC

## 2012-11-26 MED ORDER — METOPROLOL TARTRATE 50 MG PO TABS
50.0000 mg | ORAL_TABLET | Freq: Once | ORAL | Status: AC
  Administered 2012-11-26: 50 mg via ORAL
  Filled 2012-11-26: qty 1

## 2012-11-26 NOTE — ED Notes (Signed)
Abscess on buttocks...

## 2012-11-26 NOTE — ED Provider Notes (Signed)
History     CSN: [id number]  Arrival date & time 11/26/12  [id number]   First MD Initiated Contact with Patient 11/26/12 [phone]      Chief Complaint  Patient presents with  . Abscess    (Consider location/radiation/quality/duration/timing/severity/associated sxs/prior treatment) HPI Comments: Patient c/o pain, itching and drainage of two small abscess to the upper buttocks for several days.  States she has hx of same but symptoms reoccur.  She also states that she has noticed intermittent bleeding from the same areas.  She denies fever, vomiting or chills or pain with defecation.  Patient also requests refills of her BP medication and her thyroid medication.  States she is in process of switching doctors and has ran out of her medications.  She denies symptoms at this time  Patient is a 56 y.o. female presenting with abscess. The history is provided by the patient.  Abscess  This is a recurrent problem. The current episode started less than one week ago. The onset was gradual. The problem occurs continuously. The problem has been unchanged. Affected Location: upper buttocks. The problem is mild. The abscess is characterized by itchiness, painfulness and draining (bleeding). It is unknown what she was exposed to. The abscess first occurred at home. Pertinent negatives include no decrease in physical activity, no fever, no diarrhea, no vomiting and no decreased responsiveness. Past medical history comments: hx of same. There were no sick contacts. She has received no recent medical care.    Past Medical History  Diagnosis Date  . Hypertension   . Glaucoma(365)   . Thyroid disease   . Diabetes mellitus     Past Surgical History  Procedure Date  . Abdominal hysterectomy   . Thyroid surgery     No family history on file.  History  Substance Use Topics  . Smoking status: Never Smoker   . Smokeless tobacco: Not on file  . Alcohol Use: No    OB History    Grav Para Term Preterm  Abortions TAB SAB Ect Mult Living                  Review of Systems  Constitutional: Negative for fever, chills and decreased responsiveness.  Gastrointestinal: Negative for nausea, vomiting and diarrhea.  Musculoskeletal: Negative for back pain, joint swelling and arthralgias.  Skin: Positive for color change and wound.       Abscess   Hematological: Negative for adenopathy.  All other systems reviewed and are negative.    Allergies  Aspirin  Home Medications   Current Outpatient Rx  Name  Route  Sig  Dispense  Refill  . CETIRIZINE HCL 10 MG PO TABS   Oral   Take 1 tablet (10 mg total) by mouth daily. For nasal drainage,  Itchy and watery eyes.   20 tablet   0   . FLUCONAZOLE 150 MG PO TABS      Take tablet as a single dose   1 tablet   0   . LATANOPROST 0.005 % OP SOLN   Both Eyes   Place 1 drop into both eyes 2 (two) times daily.         Marland Kitchen LEVOTHYROXINE SODIUM 150 MCG PO TABS   Oral   Take 150 mcg by mouth daily.         Marland Kitchen LEVOTHYROXINE SODIUM 150 MCG PO TABS      Take one tablet po qd   15 tablet   0   . METFORMIN HCL  500 MG PO TABS   Oral   Take 500 mg by mouth 2 (two) times daily with a meal.         . METOPROLOL TARTRATE 50 MG PO TABS   Oral   Take 50 mg by mouth daily.         Marland Kitchen METOPROLOL TARTRATE 50 MG PO TABS   Oral   Take 1 tablet (50 mg total) by mouth 2 (two) times daily.   60 tablet   0   . METOPROLOL TARTRATE 50 MG PO TABS   Oral   Take 1 tablet (50 mg total) by mouth 2 (two) times daily.   30 tablet   0   . TIMOLOL HEMIHYDRATE 0.5 % OP SOLN   Both Eyes   Place 1 drop into both eyes 2 (two) times daily.           BP 206/106  Pulse 88  Temp 97.5 F (36.4 C) (Oral)  Resp 18  Ht 5\' 5"  (1.651 m)  Wt 330 lb (149.687 kg)  BMI 54.91 kg/m2  SpO2 98%  Physical Exam  Nursing note and vitals reviewed. Constitutional: She is oriented to person, place, and time. She appears well-developed and well-nourished. No  distress.  HENT:  Head: Normocephalic and atraumatic.  Cardiovascular: Normal rate, regular rhythm and normal heart sounds.   Pulmonary/Chest: Effort normal and breath sounds normal.  Neurological: She is alert and oriented to person, place, and time. She exhibits normal muscle tone. Coordination normal.  Skin: Skin is warm. There is erythema.          Two < 1 cm slightly raised pustules to the left upper gluteal fold. Mild yellow drainage present. No erythema or induration. 1 cm skin tag also noted to same area with area that appears abraded.  No active bleeding    ED Course  Procedures (including critical care time)  Labs Reviewed - No data to display No results found.     MDM    Patient has two small pustules that are currently draining.  I&D not indicated at this time.  Pt is hypertensive, but denies headaches, dizziness, CP or dyspnea.  Has not take her BP medication for several days.    Will refill her metoprolol and synthroid and prescribe doxy and triamcinolone cream.  Given referral for PMD and general surgery      Reymond Maynez L. [place], [place] 11/27/12 2129

## 2012-11-28 NOTE — ED Provider Notes (Signed)
Medical screening examination/treatment/procedure(s) were performed by non-physician practitioner and as supervising physician I was immediately available for consultation/collaboration.  Nat Christen, MD 11/28/12 [phone]

## 2013-01-28 ENCOUNTER — Emergency Department (HOSPITAL_COMMUNITY)
Admission: EM | Admit: 2013-01-28 | Discharge: 2013-01-28 | Disposition: A | Discharge status: Home / Self Care | Payer: Medicare Other | Attending: Emergency Medicine | Admitting: Emergency Medicine

## 2013-01-28 ENCOUNTER — Encounter (HOSPITAL_COMMUNITY): Payer: Self-pay | Admitting: *Deleted

## 2013-01-28 DIAGNOSIS — J40 Bronchitis, not specified as acute or chronic: Secondary | ICD-10-CM

## 2013-01-28 DIAGNOSIS — R059 Cough, unspecified: Secondary | ICD-10-CM | POA: Diagnosis not present

## 2013-01-28 DIAGNOSIS — R51 Headache: Secondary | ICD-10-CM | POA: Diagnosis not present

## 2013-01-28 DIAGNOSIS — R062 Wheezing: Secondary | ICD-10-CM | POA: Diagnosis not present

## 2013-01-28 DIAGNOSIS — E079 Disorder of thyroid, unspecified: Secondary | ICD-10-CM | POA: Diagnosis not present

## 2013-01-28 DIAGNOSIS — Z79899 Other long term (current) drug therapy: Secondary | ICD-10-CM | POA: Diagnosis not present

## 2013-01-28 DIAGNOSIS — E119 Type 2 diabetes mellitus without complications: Secondary | ICD-10-CM | POA: Insufficient documentation

## 2013-01-28 DIAGNOSIS — H409 Unspecified glaucoma: Secondary | ICD-10-CM | POA: Insufficient documentation

## 2013-01-28 DIAGNOSIS — J3489 Other specified disorders of nose and nasal sinuses: Secondary | ICD-10-CM | POA: Insufficient documentation

## 2013-01-28 DIAGNOSIS — J209 Acute bronchitis, unspecified: Secondary | ICD-10-CM | POA: Insufficient documentation

## 2013-01-28 DIAGNOSIS — R05 Cough: Secondary | ICD-10-CM | POA: Insufficient documentation

## 2013-01-28 DIAGNOSIS — R519 Headache, unspecified: Secondary | ICD-10-CM

## 2013-01-28 DIAGNOSIS — I10 Essential (primary) hypertension: Secondary | ICD-10-CM | POA: Diagnosis not present

## 2013-01-28 MED ORDER — LEVOTHYROXINE SODIUM 150 MCG PO TABS: 150.0000 ug | ORAL_TABLET | Freq: Every day | ORAL | Status: DC

## 2013-01-28 MED ORDER — GUAIFENESIN-CODEINE 100-10 MG/5ML PO SYRP: 5.0000 mL | ORAL_SOLUTION | Freq: Three times a day (TID) | ORAL | Status: DC | PRN

## 2013-01-28 MED ORDER — AZITHROMYCIN 250 MG PO TABS: ORAL_TABLET | ORAL | Status: DC

## 2013-01-28 MED ORDER — METOPROLOL TARTRATE 50 MG PO TABS: 50.0000 mg | ORAL_TABLET | Freq: Two times a day (BID) | ORAL | Status: DC

## 2013-01-28 NOTE — ED Notes (Signed)
Patient does not need anything at this time. 

## 2013-01-28 NOTE — ED Notes (Signed)
Pt states that her last dose of blood pressure medication was yesterday am, is suppose to take it bid,

## 2013-01-28 NOTE — ED Provider Notes (Signed)
History     CSN: [id number]  Arrival date & time 01/28/13  0903   First MD Initiated Contact with Patient 01/28/13 [phone]      Chief Complaint  Patient presents with  . Headache  . Cough   HPI  Maureen Jackson is a 57 y.o. female who presents to the ED with cough. The cough started yesterday. She describes it as productive with clear sputum. Associated symptoms include sinus pressure, headache and congestion. Thinks she has a sinus infection. She also reports being out of her BP medication and thyroid medicine. Took last dose yesterday. She denies nausea, vomiting, fever or chills.    Past Medical History  Diagnosis Date  . Hypertension   . Glaucoma(365)   . Thyroid disease   . Diabetes mellitus     Past Surgical History  Procedure Laterality Date  . Abdominal hysterectomy    . Thyroid surgery      No family history on file.  History  Substance Use Topics  . Smoking status: Never Smoker   . Smokeless tobacco: Not on file  . Alcohol Use: No    OB History   Grav Para Term Preterm Abortions TAB SAB Ect Mult Living                  Review of Systems  Constitutional: Negative for fever, chills, diaphoresis and fatigue.  HENT: Positive for sinus pressure. Negative for ear pain, congestion, sore throat, facial swelling, neck pain, neck stiffness and dental problem.   Eyes: Negative for photophobia, pain and discharge.  Respiratory: Positive for cough and wheezing. Negative for chest tightness.   Cardiovascular: Negative for chest pain and palpitations.  Gastrointestinal: Negative for nausea, vomiting, abdominal pain, diarrhea, constipation and abdominal distention.  Genitourinary: Negative for dysuria, frequency, flank pain and difficulty urinating.  Musculoskeletal: Negative for myalgias, back pain and gait problem.  Skin: Negative for color change and rash.  Neurological: Positive for headaches. Negative for dizziness, speech difficulty, weakness, light-headedness and  numbness.  Psychiatric/Behavioral: Negative for confusion and agitation. The patient is not nervous/anxious.     Allergies  Aspirin  Home Medications   Current Outpatient Rx  Name  Route  Sig  Dispense  Refill  . latanoprost (XALATAN) 0.005 % ophthalmic solution   Both Eyes   Place 1 drop into both eyes 2 (two) times daily.         Marland Kitchen levothyroxine (SYNTHROID, LEVOTHROID) 150 MCG tablet   Oral   Take 1 tablet (150 mcg total) by mouth daily.   30 tablet   0   . metFORMIN (GLUCOPHAGE) 500 MG tablet   Oral   Take 500 mg by mouth 2 (two) times daily with a meal.         . metoprolol (LOPRESSOR) 50 MG tablet   Oral   Take 2 tablets (100 mg total) by mouth 2 (two) times daily.   30 tablet   0   . timolol (BETIMOL) 0.5 % ophthalmic solution   Both Eyes   Place 1 drop into both eyes 2 (two) times daily.           BP 204/120  Pulse 87  Temp(Src) 98.2 F (36.8 C)  Resp 18  Ht 5\' 5"  (1.651 m)  Wt 330 lb (149.687 kg)  BMI 54.91 kg/m2  SpO2 98%  Physical Exam  Nursing note and vitals reviewed. Constitutional: She is oriented to person, place, and time. She appears well-developed and well-nourished. No distress.  HENT:  Head: Normocephalic and atraumatic.  Right Ear: Tympanic membrane normal.  Left Ear: Tympanic membrane normal.  Nose: Mucosal edema present. Right sinus exhibits frontal sinus tenderness. Left sinus exhibits frontal sinus tenderness.  Mouth/Throat: Uvula is midline, oropharynx is clear and moist and mucous membranes are normal.  Eyes: EOM are normal. Pupils are equal, round, and reactive to light.  Neck: Neck supple.  Cardiovascular: Normal rate and regular rhythm.   Pulmonary/Chest: Effort normal. No respiratory distress. She has no wheezes. She has no rales.  Abdominal: Soft. There is no tenderness.  Musculoskeletal: Normal range of motion. She exhibits no edema.  Neurological: She is alert and oriented to person, place, and time. No cranial  nerve deficit.  Skin: Skin is warm and dry.  Psychiatric: She has a normal mood and affect. Her behavior is normal. Judgment and thought content normal.   Procedures  RECHECK BP 175/84 10:15 am  Assessment: 57 y.o. female with cough and congestion   Sinus headache   Bronchitis   Medication refill  Plan:  Treat symptoms  I have given the patient Rx for her Blood pressure and thyroid medication until she can follow up with her PCP for her April appointment.  Discussed with the patient and all questioned fully answered. She will return if any problems arise.    Medication List    TAKE these medications       azithromycin 250 MG tablet  Commonly known as:  ZITHROMAX Z-PAK  Take 2 tablets today with food and then one tablet daily     guaiFENesin-codeine 100-10 MG/5ML syrup  Commonly known as:  ROBITUSSIN AC  Take 5 mLs by mouth 3 (three) times daily as needed for cough.     levothyroxine 150 MCG tablet  Commonly known as:  SYNTHROID  Take 1 tablet (150 mcg total) by mouth daily.     metoprolol 50 MG tablet  Commonly known as:  LOPRESSOR  Take 1 tablet (50 mg total) by mouth 2 (two) times daily.      ASK your doctor about these medications       latanoprost 0.005 % ophthalmic solution  Commonly known as:  XALATAN  Place 1 drop into both eyes 2 (two) times daily.     levothyroxine 150 MCG tablet  Commonly known as:  SYNTHROID, LEVOTHROID  Take 1 tablet (150 mcg total) by mouth daily.     metFORMIN 500 MG tablet  Commonly known as:  GLUCOPHAGE  Take 500 mg by mouth 2 (two) times daily with a meal.     metoprolol 50 MG tablet  Commonly known as:  LOPRESSOR  Take 2 tablets (100 mg total) by mouth 2 (two) times daily.     timolol 0.5 % ophthalmic solution  Commonly known as:  BETIMOL  Place 1 drop into both eyes 2 (two) times daily.          I  Have    Ashley Murrain, NP 01/28/13 1035

## 2013-01-28 NOTE — ED Notes (Signed)
Pt c/o headache, sinus pain, cough that is productive with clear sputum that started Sunday, pt also concerned because she is out of her blood pressure medication and thyroid medication. Last dose was yesterday,  does not have appointment with pcp until April,

## 2013-01-29 NOTE — ED Provider Notes (Signed)
Medical screening examination/treatment/procedure(s) were performed by non-physician practitioner and as supervising physician I was immediately available for consultation/collaboration.  Nat Christen, MD 01/29/13 [phone]

## 2013-01-30 ENCOUNTER — Encounter (HOSPITAL_COMMUNITY): Payer: Self-pay | Admitting: Emergency Medicine

## 2013-01-30 ENCOUNTER — Emergency Department (HOSPITAL_COMMUNITY)
Admission: EM | Admit: 2013-01-30 | Discharge: 2013-01-31 | Disposition: A | Discharge status: Home / Self Care | Payer: Medicare Other | Attending: Emergency Medicine | Admitting: Emergency Medicine

## 2013-01-30 DIAGNOSIS — Z79899 Other long term (current) drug therapy: Secondary | ICD-10-CM | POA: Insufficient documentation

## 2013-01-30 DIAGNOSIS — I10 Essential (primary) hypertension: Secondary | ICD-10-CM | POA: Diagnosis not present

## 2013-01-30 DIAGNOSIS — H409 Unspecified glaucoma: Secondary | ICD-10-CM | POA: Diagnosis not present

## 2013-01-30 DIAGNOSIS — E079 Disorder of thyroid, unspecified: Secondary | ICD-10-CM | POA: Insufficient documentation

## 2013-01-30 DIAGNOSIS — R000 Tachycardia, unspecified: Secondary | ICD-10-CM | POA: Insufficient documentation

## 2013-01-30 DIAGNOSIS — R739 Hyperglycemia, unspecified: Secondary | ICD-10-CM

## 2013-01-30 DIAGNOSIS — E119 Type 2 diabetes mellitus without complications: Secondary | ICD-10-CM | POA: Insufficient documentation

## 2013-01-30 DIAGNOSIS — R7309 Other abnormal glucose: Secondary | ICD-10-CM | POA: Insufficient documentation

## 2013-01-30 DIAGNOSIS — F411 Generalized anxiety disorder: Secondary | ICD-10-CM | POA: Insufficient documentation

## 2013-01-30 DIAGNOSIS — R002 Palpitations: Secondary | ICD-10-CM | POA: Diagnosis not present

## 2013-01-30 DIAGNOSIS — F419 Anxiety disorder, unspecified: Secondary | ICD-10-CM

## 2013-01-30 LAB — CBC WITH DIFFERENTIAL/PLATELET
Eosinophils Relative: 8 % — ABNORMAL HIGH (ref 0–5)
HCT: 38.7 % (ref 36.0–46.0)
Lymphocytes Relative: 45 % (ref 12–46)
MCHC: 33.6 g/dL (ref 30.0–36.0)
MCV: 87.2 fL (ref 78.0–100.0)
Monocytes Absolute: 0.3 10*3/uL (ref 0.1–1.0)
Monocytes Relative: 4 % (ref 3–12)
RDW: 13.4 % (ref 11.5–15.5)

## 2013-01-30 LAB — URINALYSIS, ROUTINE W REFLEX MICROSCOPIC
Nitrite: NEGATIVE
Specific Gravity, Urine: 1.025 (ref 1.005–1.030)
Urobilinogen, UA: 0.2 mg/dL (ref 0.0–1.0)
pH: 7 (ref 5.0–8.0)

## 2013-01-30 LAB — GLUCOSE, CAPILLARY: Glucose-Capillary: 350 mg/dL — ABNORMAL HIGH (ref 70–99)

## 2013-01-30 MED ORDER — SODIUM CHLORIDE 0.9 % IV BOLUS (SEPSIS)
1000.0000 mL | Freq: Once | INTRAVENOUS | Status: AC
  Administered 2013-01-30: 1000 mL via INTRAVENOUS

## 2013-01-30 MED ORDER — LORAZEPAM 2 MG/ML IJ SOLN
1.0000 mg | Freq: Once | INTRAMUSCULAR | Status: AC
  Administered 2013-01-30: 1 mg via INTRAVENOUS
  Filled 2013-01-30: qty 1

## 2013-01-30 NOTE — ED Provider Notes (Signed)
History     CSN: [id number]  Arrival date & time 01/30/13  2257   First MD Initiated Contact with Patient 01/30/13 2310      Chief Complaint  Patient presents with  . Tachycardia  . Chest Pain    (Consider location/radiation/quality/duration/timing/severity/associated sxs/prior treatment) HPI Maureen Jackson is a 57 y.o. female who presents to the Emergency Department complaining of feeling hot, nervous and her heart was racing. Felt badly.   Past Medical History  Diagnosis Date  . Hypertension   . Glaucoma(365)   . Thyroid disease   . Diabetes mellitus     Past Surgical History  Procedure Laterality Date  . Abdominal hysterectomy    . Thyroid surgery      History reviewed. No pertinent family history.  History  Substance Use Topics  . Smoking status: Never Smoker   . Smokeless tobacco: Not on file  . Alcohol Use: No    OB History   Grav Para Term Preterm Abortions TAB SAB Ect Mult Living                  Review of Systems  Constitutional: Negative for fever.       10 Systems reviewed and are negative for acute change except as noted in the HPI.  HENT: Negative for congestion.   Eyes: Negative for discharge and redness.  Respiratory: Negative for cough and shortness of breath.   Cardiovascular: Positive for palpitations. Negative for chest pain.  Gastrointestinal: Negative for vomiting and abdominal pain.  Musculoskeletal: Negative for back pain.  Skin: Negative for rash.  Neurological: Negative for syncope, numbness and headaches.  Psychiatric/Behavioral:       No behavior change.    Allergies  Aspirin  Home Medications   Current Outpatient Rx  Name  Route  Sig  Dispense  Refill  . azithromycin (ZITHROMAX Z-PAK) 250 MG tablet      Take 2 tablets today with food and then one tablet daily   6 tablet   0   . guaiFENesin-codeine (ROBITUSSIN AC) 100-10 MG/5ML syrup   Oral   Take 5 mLs by mouth 3 (three) times daily as needed for cough.   120  mL   0   . latanoprost (XALATAN) 0.005 % ophthalmic solution   Both Eyes   Place 1 drop into both eyes 2 (two) times daily.         Marland Kitchen levothyroxine (SYNTHROID) 150 MCG tablet   Oral   Take 1 tablet (150 mcg total) by mouth daily.   30 tablet   1   . levothyroxine (SYNTHROID, LEVOTHROID) 150 MCG tablet   Oral   Take 1 tablet (150 mcg total) by mouth daily.   30 tablet   0   . metFORMIN (GLUCOPHAGE) 500 MG tablet   Oral   Take 500 mg by mouth 2 (two) times daily with a meal.         . metoprolol (LOPRESSOR) 50 MG tablet   Oral   Take 2 tablets (100 mg total) by mouth 2 (two) times daily.   30 tablet   0   . metoprolol (LOPRESSOR) 50 MG tablet   Oral   Take 1 tablet (50 mg total) by mouth 2 (two) times daily.   60 tablet   1   . timolol (BETIMOL) 0.5 % ophthalmic solution   Both Eyes   Place 1 drop into both eyes 2 (two) times daily.  BP 229/119  Pulse 87  Temp(Src) 98 F (36.7 C) (Oral)  Resp 20  Ht 5\' 5"  (1.651 m)  Wt 320 lb (145.151 kg)  BMI 53.25 kg/m2  SpO2 100%  Physical Exam  Nursing note and vitals reviewed. Constitutional:  Awake, alert, nontoxic appearance.  HENT:  Head: Atraumatic.  Eyes: Right eye exhibits no discharge. Left eye exhibits no discharge.  Neck: Neck supple.  Cardiovascular: Normal rate and normal heart sounds.   Pulmonary/Chest: Effort normal and breath sounds normal. She exhibits no tenderness.  Abdominal: Soft. Bowel sounds are normal. There is no tenderness. There is no rebound.  Musculoskeletal: She exhibits no tenderness.  Baseline ROM, no obvious new focal weakness.  Neurological:  Mental status and motor strength appears baseline for patient and situation.  Skin: No rash noted.  Psychiatric: She has a normal mood and affect.    ED Course  Procedures (including critical care time) Results for orders placed during the hospital encounter of 01/30/13  CBC WITH DIFFERENTIAL      Result Value Range   WBC  8.0  4.0 - 10.5 K/uL   RBC 4.44  3.87 - 5.11 MIL/uL   Hemoglobin 13.0  12.0 - 15.0 g/dL   HCT 38.7  36.0 - 46.0 %   MCV 87.2  78.0 - 100.0 fL   MCH 29.3  26.0 - 34.0 pg   MCHC 33.6  30.0 - 36.0 g/dL   RDW 13.4  11.5 - 15.5 %   Platelets 284  150 - 400 K/uL   Neutrophils Relative 43  43 - 77 %   Neutro Abs 3.5  1.7 - 7.7 K/uL   Lymphocytes Relative 45  12 - 46 %   Lymphs Abs 3.6  0.7 - 4.0 K/uL   Monocytes Relative 4  3 - 12 %   Monocytes Absolute 0.3  0.1 - 1.0 K/uL   Eosinophils Relative 8 (*) 0 - 5 %   Eosinophils Absolute 0.7  0.0 - 0.7 K/uL   Basophils Relative 0  0 - 1 %   Basophils Absolute 0.0  0.0 - 0.1 K/uL  BASIC METABOLIC PANEL      Result Value Range   Sodium 136  135 - 145 mEq/L   Potassium 3.8  3.5 - 5.1 mEq/L   Chloride 98  96 - 112 mEq/L   CO2 27  19 - 32 mEq/L   Glucose, Bld 402 (*) 70 - 99 mg/dL   BUN 12  6 - 23 mg/dL   Creatinine, Ser 0.76  0.50 - 1.10 mg/dL   Calcium 9.2  8.4 - 10.5 mg/dL   GFR calc non Af Amer >90  >90 mL/min   GFR calc Af Amer >90  >90 mL/min  URINALYSIS, ROUTINE W REFLEX MICROSCOPIC      Result Value Range   Color, Urine YELLOW  YELLOW   APPearance CLEAR  CLEAR   Specific Gravity, Urine 1.025  1.005 - 1.030   pH 7.0  5.0 - 8.0   Glucose, UA 500 (*) NEGATIVE mg/dL   Hgb urine dipstick TRACE (*) NEGATIVE   Bilirubin Urine NEGATIVE  NEGATIVE   Ketones, ur NEGATIVE  NEGATIVE mg/dL   Protein, ur 100 (*) NEGATIVE mg/dL   Urobilinogen, UA 0.2  0.0 - 1.0 mg/dL   Nitrite NEGATIVE  NEGATIVE   Leukocytes, UA NEGATIVE  NEGATIVE  TROPONIN I      Result Value Range   Troponin I <0.30  <0.30 ng/mL  GLUCOSE, CAPILLARY  Result Value Range   Glucose-Capillary 350 (*) 70 - 99 mg/dL  URINE MICROSCOPIC-ADD ON      Result Value Range   WBC, UA 0-2  <3 WBC/hpf   RBC / HPF 0-2  <3 RBC/hpf  GLUCOSE, CAPILLARY      Result Value Range   Glucose-Capillary 335 (*) 70 - 99 mg/dL  GLUCOSE, CAPILLARY      Result Value Range   Glucose-Capillary  278 (*) 70 - 99 mg/dL     Date: 01/30/2013    2312  Rate: 83  Rhythm: normal sinus rhythm  QRS Axis: normal  Intervals: normal  ST/T Wave abnormalities: normal  Conduction Disutrbances:none  Narrative Interpretation: septal infarct, lateral infarct, ages undetermined  Old EKG Reviewed: unchanged c/w 09/02/2005   MDM  Patient here with anxiety, palpitations, feeling hot. Sugar was high. Given IVF and insulin. Labs are unremarkable. Given ativan and zofran. Reviewed information with patient. Pt stable in ED with no significant deterioration in condition.The patient appears reasonably screened and/or stabilized for discharge and I doubt any other medical condition or other [facility] requiring further screening, evaluation, or treatment in the ED at this time prior to discharge.  MDM Reviewed: nursing note and vitals Interpretation: labs and ECG           Gypsy Balsam. Olin Hauser, MD 01/31/13 [phone]

## 2013-01-30 NOTE — ED Notes (Signed)
Pt c/o "hurting all over" and "my heart is racing. States the "racing heart" is off and on. Pt also c/o feeling "hot all over".

## 2013-01-30 NOTE — ED Notes (Signed)
Patient states "My heart is racing and I feel hot all over."

## 2013-01-31 LAB — BASIC METABOLIC PANEL
Chloride: 98 mEq/L (ref 96–112)
GFR calc Af Amer: >90 mL/min (ref >90)
GFR calc non Af Amer: >90 mL/min (ref >90)
Glucose, Bld: 402 mg/dL — ABNORMAL HIGH (ref 70–99)
Potassium: 3.8 mEq/L (ref 3.5–5.1)
Sodium: 136 mEq/L (ref 135–145)

## 2013-01-31 LAB — TROPONIN I: Troponin I: <0.3 ng/mL (ref <0.30)

## 2013-01-31 MED ORDER — ONDANSETRON HCL 4 MG/2ML IJ SOLN
4.0000 mg | Freq: Once | INTRAMUSCULAR | Status: AC
  Administered 2013-01-31: 4 mg via INTRAVENOUS
  Filled 2013-01-31: qty 2

## 2013-01-31 MED ORDER — INSULIN REGULAR HUMAN 100 UNIT/ML IJ SOLN
12.0000 [IU] | Freq: Once | INTRAMUSCULAR | Status: AC
  Administered 2013-01-31: 12 [IU] via INTRAVENOUS
  Filled 2013-01-31: qty 0.12

## 2013-01-31 NOTE — ED Notes (Signed)
CBG=278 

## 2013-01-31 NOTE — ED Notes (Signed)
CBG 335  

## 2013-01-31 NOTE — ED Notes (Signed)
Pt c/o nausea. Dr. Olin Hauser notified.

## 2013-03-04 ENCOUNTER — Observation Stay (HOSPITAL_COMMUNITY)
Admission: EM | Admit: 2013-03-04 | Discharge: 2013-03-05 | Disposition: A | Discharge status: Home / Self Care | Payer: Medicare Other | Attending: Internal Medicine | Admitting: Internal Medicine

## 2013-03-04 ENCOUNTER — Encounter (HOSPITAL_COMMUNITY): Payer: Self-pay | Admitting: *Deleted

## 2013-03-04 DIAGNOSIS — Z9889 Other specified postprocedural states: Secondary | ICD-10-CM

## 2013-03-04 DIAGNOSIS — R5381 Other malaise: Secondary | ICD-10-CM | POA: Diagnosis not present

## 2013-03-04 DIAGNOSIS — E039 Hypothyroidism, unspecified: Secondary | ICD-10-CM | POA: Diagnosis not present

## 2013-03-04 DIAGNOSIS — R42 Dizziness and giddiness: Secondary | ICD-10-CM | POA: Insufficient documentation

## 2013-03-04 DIAGNOSIS — I10 Essential (primary) hypertension: Secondary | ICD-10-CM | POA: Diagnosis present

## 2013-03-04 DIAGNOSIS — IMO0001 Reserved for inherently not codable concepts without codable children: Secondary | ICD-10-CM

## 2013-03-04 DIAGNOSIS — R404 Transient alteration of awareness: Secondary | ICD-10-CM | POA: Diagnosis not present

## 2013-03-04 DIAGNOSIS — IMO0002 Reserved for concepts with insufficient information to code with codable children: Secondary | ICD-10-CM

## 2013-03-04 DIAGNOSIS — K219 Gastro-esophageal reflux disease without esophagitis: Secondary | ICD-10-CM | POA: Diagnosis present

## 2013-03-04 DIAGNOSIS — F17200 Nicotine dependence, unspecified, uncomplicated: Secondary | ICD-10-CM | POA: Diagnosis not present

## 2013-03-04 DIAGNOSIS — G4730 Sleep apnea, unspecified: Secondary | ICD-10-CM | POA: Diagnosis not present

## 2013-03-04 DIAGNOSIS — E1165 Type 2 diabetes mellitus with hyperglycemia: Secondary | ICD-10-CM | POA: Diagnosis present

## 2013-03-04 DIAGNOSIS — H8309 Labyrinthitis, unspecified ear: Secondary | ICD-10-CM | POA: Diagnosis not present

## 2013-03-04 DIAGNOSIS — E118 Type 2 diabetes mellitus with unspecified complications: Secondary | ICD-10-CM | POA: Diagnosis present

## 2013-03-04 DIAGNOSIS — E1159 Type 2 diabetes mellitus with other circulatory complications: Secondary | ICD-10-CM | POA: Diagnosis present

## 2013-03-04 DIAGNOSIS — R51 Headache: Secondary | ICD-10-CM | POA: Diagnosis not present

## 2013-03-04 DIAGNOSIS — R5383 Other fatigue: Secondary | ICD-10-CM | POA: Diagnosis not present

## 2013-03-04 DIAGNOSIS — Z9089 Acquired absence of other organs: Secondary | ICD-10-CM

## 2013-03-04 DIAGNOSIS — R110 Nausea: Secondary | ICD-10-CM | POA: Diagnosis not present

## 2013-03-04 HISTORY — DX: Obstructive sleep apnea (adult) (pediatric): G47.33

## 2013-03-04 HISTORY — DX: Morbid (severe) obesity due to excess calories: E66.01

## 2013-03-04 LAB — CBC WITH DIFFERENTIAL/PLATELET
Basophils Relative: 0 % (ref 0–1)
Eosinophils Absolute: 0.4 10*3/uL (ref 0.0–0.7)
Eosinophils Relative: 7 % — ABNORMAL HIGH (ref 0–5)
HCT: 39.7 % (ref 36.0–46.0)
Hemoglobin: 12.9 g/dL (ref 12.0–15.0)
MCH: 27.9 pg (ref 26.0–34.0)
MCHC: 32.5 g/dL (ref 30.0–36.0)
Monocytes Absolute: 0.3 10*3/uL (ref 0.1–1.0)
Monocytes Relative: 5 % (ref 3–12)

## 2013-03-04 LAB — URINALYSIS, ROUTINE W REFLEX MICROSCOPIC
Glucose, UA: NEGATIVE mg/dL
Leukocytes, UA: NEGATIVE
Protein, ur: NEGATIVE mg/dL
Specific Gravity, Urine: 1.025 (ref 1.005–1.030)
Urobilinogen, UA: 0.2 mg/dL (ref 0.0–1.0)

## 2013-03-04 LAB — BASIC METABOLIC PANEL
BUN: 9 mg/dL (ref 6–23)
Creatinine, Ser: 0.77 mg/dL (ref 0.50–1.10)
GFR calc Af Amer: >90 mL/min (ref >90)
GFR calc non Af Amer: >90 mL/min (ref >90)

## 2013-03-04 MED ORDER — FLEET ENEMA 7-19 GM/118ML RE ENEM: 1.0000 | ENEMA | Freq: Once | RECTAL | Status: AC | PRN

## 2013-03-04 MED ORDER — LORAZEPAM 1 MG PO TABS
1.0000 mg | ORAL_TABLET | Freq: Once | ORAL | Status: AC
  Administered 2013-03-04: 1 mg via ORAL
  Filled 2013-03-04: qty 1

## 2013-03-04 MED ORDER — ONDANSETRON 8 MG PO TBDP
8.0000 mg | ORAL_TABLET | Freq: Once | ORAL | Status: AC
  Administered 2013-03-04: 8 mg via ORAL
  Filled 2013-03-04: qty 1

## 2013-03-04 MED ORDER — LEVOTHYROXINE SODIUM 75 MCG PO TABS
150.0000 ug | ORAL_TABLET | Freq: Every day | ORAL | Status: DC
  Administered 2013-03-05: 150 ug via ORAL
  Filled 2013-03-04: qty 2

## 2013-03-04 MED ORDER — ACETAMINOPHEN 325 MG PO TABS
650.0000 mg | ORAL_TABLET | ORAL | Status: DC | PRN
  Administered 2013-03-05 (×2): 650 mg via ORAL
  Filled 2013-03-04 (×2): qty 2

## 2013-03-04 MED ORDER — ENOXAPARIN SODIUM 40 MG/0.4ML ~~LOC~~ SOLN
40.0000 mg | SUBCUTANEOUS | Status: DC
  Administered 2013-03-05: 40 mg via SUBCUTANEOUS
  Filled 2013-03-04: qty 0.4

## 2013-03-04 MED ORDER — METOPROLOL TARTRATE 50 MG PO TABS
50.0000 mg | ORAL_TABLET | Freq: Two times a day (BID) | ORAL | Status: DC
Start: 2013-03-04 — End: 2013-03-05
  Administered 2013-03-05 (×2): 50 mg via ORAL
  Filled 2013-03-04 (×2): qty 1

## 2013-03-04 MED ORDER — METFORMIN HCL 500 MG PO TABS
500.0000 mg | ORAL_TABLET | Freq: Two times a day (BID) | ORAL | Status: DC
  Filled 2013-03-04: qty 1

## 2013-03-04 MED ORDER — CHLORHEXIDINE GLUCONATE 0.12 % MT SOLN
15.0000 mL | Freq: Two times a day (BID) | OROMUCOSAL | Status: DC
  Administered 2013-03-05: 15 mL via OROMUCOSAL
  Filled 2013-03-04: qty 15

## 2013-03-04 MED ORDER — SORBITOL 70 % SOLN: 30.0000 mL | Freq: Every day | Status: DC | PRN

## 2013-03-04 MED ORDER — POTASSIUM CHLORIDE IN NACL 20-0.9 MEQ/L-% IV SOLN
INTRAVENOUS | Status: DC
  Administered 2013-03-05: via INTRAVENOUS

## 2013-03-04 MED ORDER — LORAZEPAM 2 MG/ML IJ SOLN: 1.0000 mg | INTRAMUSCULAR | Status: DC | PRN

## 2013-03-04 MED ORDER — INSULIN ASPART 100 UNIT/ML ~~LOC~~ SOLN
0.0000 [IU] | Freq: Three times a day (TID) | SUBCUTANEOUS | Status: DC
  Administered 2013-03-05: 8 [IU] via SUBCUTANEOUS
  Administered 2013-03-05: 3 [IU] via SUBCUTANEOUS

## 2013-03-04 MED ORDER — ONDANSETRON HCL 4 MG/2ML IJ SOLN: 4.0000 mg | Freq: Three times a day (TID) | INTRAMUSCULAR | Status: DC | PRN

## 2013-03-04 MED ORDER — SODIUM CHLORIDE 0.9 % IV SOLN: INTRAVENOUS | Status: DC

## 2013-03-04 MED ORDER — TIMOLOL MALEATE 0.5 % OP SOLN
1.0000 [drp] | Freq: Two times a day (BID) | OPHTHALMIC | Status: DC
  Administered 2013-03-05 (×2): 1 [drp] via OPHTHALMIC
  Filled 2013-03-04: qty 5

## 2013-03-04 MED ORDER — ONDANSETRON HCL 4 MG/2ML IJ SOLN: 4.0000 mg | INTRAMUSCULAR | Status: DC | PRN

## 2013-03-04 MED ORDER — LATANOPROST 0.005 % OP SOLN
1.0000 [drp] | Freq: Two times a day (BID) | OPHTHALMIC | Status: DC
  Filled 2013-03-04 (×2): qty 2.5

## 2013-03-04 MED ORDER — INSULIN ASPART 100 UNIT/ML ~~LOC~~ SOLN
0.0000 [IU] | Freq: Every day | SUBCUTANEOUS | Status: DC
  Administered 2013-03-05: 2 [IU] via SUBCUTANEOUS

## 2013-03-04 MED ORDER — BIOTENE DRY MOUTH MT LIQD
15.0000 mL | Freq: Two times a day (BID) | OROMUCOSAL | Status: DC
  Administered 2013-03-05: 15 mL via OROMUCOSAL

## 2013-03-04 MED ORDER — ONDANSETRON HCL 4 MG/2ML IJ SOLN
4.0000 mg | Freq: Once | INTRAMUSCULAR | Status: AC
  Administered 2013-03-04: 4 mg via INTRAVENOUS
  Filled 2013-03-04: qty 2

## 2013-03-04 NOTE — ED Provider Notes (Signed)
History  This chart was scribed for Richarda Blade, MD by Jenne Campus, ED Scribe. This patient was seen in room APA11/APA11 and the patient's care was started at 2:23 PM.  CSN: [id number]  Arrival date & time 03/04/13  1332   First MD Initiated Contact with Patient 03/04/13 1423      Chief Complaint  Patient presents with  . Dizziness    The history is provided by the patient. No language interpreter was used.    Maureen Jackson is a 57 y.o. female brought in by ambulance, who presents to the Emergency Department complaining of sudden onset, non-changing, constant dizziness described as the room spinning with associated HA and nausea. The symptoms are worse with sitting up and improved with laying down. She states that she has decreased ambulation due to fear of falling. She reports that she ate prior to the symptoms starting and denies being hungry currently. She reports prior episodes of the same with hyperglycemia. Per EMS, CBG was 210. She denies emesis, diarrhea and CP as associated symptoms. She has daily medications for HTN, DM and glaucoma and reports that she had her medications filled in the ED.  Pt denies having a PCP currently  Past Medical History  Diagnosis Date  . Hypertension   . Glaucoma(365)   . Thyroid disease   . Diabetes mellitus     Past Surgical History  Procedure Laterality Date  . Abdominal hysterectomy    . Thyroid surgery      No family history on file.  History  Substance Use Topics  . Smoking status: Never Smoker   . Smokeless tobacco: Not on file  . Alcohol Use: No  lives with daughter Is currently unemployed   No OB history provided.  Review of Systems  Cardiovascular: Negative for chest pain.  Gastrointestinal: Positive for nausea. Negative for vomiting, abdominal pain and diarrhea.  Neurological: Positive for dizziness and headaches. Negative for weakness.  All other systems reviewed and are negative.    Allergies   Aspirin  Home Medications   Current Outpatient Rx  Name  Route  Sig  Dispense  Refill  . latanoprost (XALATAN) 0.005 % ophthalmic solution   Both Eyes   Place 1 drop into both eyes 2 (two) times daily.         Marland Kitchen levothyroxine (SYNTHROID) 150 MCG tablet   Oral   Take 1 tablet (150 mcg total) by mouth daily.   30 tablet   1   . metFORMIN (GLUCOPHAGE) 500 MG tablet   Oral   Take 500 mg by mouth 2 (two) times daily with a meal.         . metoprolol (LOPRESSOR) 50 MG tablet   Oral   Take 1 tablet (50 mg total) by mouth 2 (two) times daily.   60 tablet   1   . timolol (BETIMOL) 0.5 % ophthalmic solution   Both Eyes   Place 1 drop into both eyes 2 (two) times daily.         Marland Kitchen triamcinolone cream (KENALOG) 0.1 %   Topical   Apply 1 application topically daily as needed (Boil).           Triage Vitals: BP 190/96  Pulse 70  Temp(Src) 98.1 F (36.7 C) (Oral)  Ht 5\' 4"  (1.626 m)  Wt 325 lb (147.419 kg)  BMI 55.76 kg/m2  SpO2 99%  Physical Exam  Nursing note and vitals reviewed. Constitutional: She is oriented to person, place,  and time. She appears well-developed and well-nourished.  HENT:  Head: Normocephalic and atraumatic.  TMs are normal bilaterally  Eyes: Conjunctivae and EOM are normal. Pupils are equal, round, and reactive to light.  Neck: Normal range of motion and phonation normal. Neck supple.  Cardiovascular: Normal rate, regular rhythm and intact distal pulses.   Pulmonary/Chest: Effort normal and breath sounds normal. She exhibits no tenderness.  Abdominal: Soft. She exhibits no distension. There is no tenderness. There is no guarding.  Musculoskeletal: Normal range of motion.  Neurological: She is alert and oriented to person, place, and time. She has normal strength. She exhibits normal muscle tone.  horizontal nystagmus with fast twitch to the right, positive head impulse testing, negative test of skew, no pronator drift, normal heel to shin   Skin: Skin is warm and dry.  Psychiatric: She has a normal mood and affect. Her behavior is normal. Judgment and thought content normal.    ED Course  Procedures (including critical care time)  DIAGNOSTIC STUDIES: Oxygen Saturation is 99% on room air, normal by my interpretation.    COORDINATION OF CARE: 2:37 PM-Advised pt that her symptoms are most likely an inner ear problem. Discussed treatment plan which includes medications with pt at bedside and pt agreed to plan. Will contact social work.  2:45 PM-Ordered 1 mg Ativan tablet and 8 mg Zofran tablet   Medications  LORazepam (ATIVAN) tablet 1 mg (1 mg Oral Given 03/04/13 1458)  ondansetron (ZOFRAN-ODT) disintegrating tablet 8 mg (8 mg Oral Given 03/04/13 1457)  ondansetron (ZOFRAN) injection 4 mg (4 mg Intravenous Given 03/04/13 1643)    Reevaluation: 18:30- she states she has had no relief. She does not feel like she can manage himself, at home.  Labs Reviewed  CBC WITH DIFFERENTIAL - Abnormal; Notable for the following:    Eosinophils Relative 7 (*)    All other components within normal limits  BASIC METABOLIC PANEL - Abnormal; Notable for the following:    Glucose, Bld 213 (*)    All other components within normal limits   No results found.   1. Labyrinthitis, unspecified laterality       MDM  Vertigo, consistent with labyrinthitis. HINTS exam is negative. She did not improve with usual treatment. She'll be admitted for observation and stabilization. Doubt CEA, ACS, meningitis, sinusitis.    [facility] to [place] the patient for further treatment  I personally performed the services described in this documentation, which was scribed in my presence. The recorded information has been reviewed and is accurate.         Richarda Blade, MD 03/04/13 2027

## 2013-03-04 NOTE — ED Notes (Signed)
Patient had sudden onset dizziness, nausea.  States she has had this before when blood glucose high.  CBG in route 210.

## 2013-03-04 NOTE — H&P (Signed)
[facility] History and Physical  Maureen Jackson  [id number]  DOB: 1956/10/20   DOA: 03/04/2013   PCP:   Neale Burly, MD   Chief Complaint:  Spinning of the rooms since midday  HPI: Maureen Jackson is an 57 y.o. female.  Morbidly obese African American lady, history of obstructive sleep apnea, diabetes, no primary care followup, has been getting her care through the emergency room, presents with sudden onset of severe vertigo, feeling the room spinning around her, associated with nausea and vomiting, since 12 noon today. She had a similar episode not so violent about a month ago, and at that time her blood sugar was found to be 350, and felt to be the cause. Today her blood sugar was around 200 per EMS, and she remains vertiginous antiemetics and Ativan in the emergency room. He was noted to have horizontal nystagmus. After 6 hours of failing to settle in the emergency room the hospitalist service was called to assist with management.  Emergency room physician reports that the nystagmus and vertigo is aggravated by movements of her head.  She denies tinnitus, but reports that sometimes she seems to lose her hearing during these episodes.  She reports severe claustrophobia and is resistant to an MRI.  She gives an unclear history of having been given a machine to sleep with at bedtime for her CPAP, but that the machine needs to be replaced, and she cannot replace it because of insurance issues.  Her insurance issues seem to be related to her being unable to afford the deductible, both low insurance kicks in.  Rewiew of Systems:   All systems negative except as marked bold or noted in the HPI;  Constitutional:    malaise, fever and chills. ;  Eyes:   eye pain, redness and discharge. ;  ENMT:   ear pain, hoarseness, nasal congestion, sinus pressure and sore throat. ;  Cardiovascular:    chest pain, palpitations, diaphoresis, dyspnea and peripheral edema.  Respiratory:   cough,  hemoptysis, occasional wheezing and stridor. ;  Gastrointestinal:  nausea, vomiting, diarrhea, constipation, abdominal pain, melena, blood in stool, hematemesis, jaundice and rectal bleeding. unusual weight loss..   Genitourinary:    frequency, dysuria, incontinence,flank pain and hematuria; Musculoskeletal:   back pain and neck pain.  swelling and trauma.;  Skin: .  pruritus, rash, abrasions, bruising and skin lesion.; ulcerations Neuro:    headache, lightheadedness and neck stiffness.  weakness, altered level of consciousness, altered mental status, extremity weakness, burning feet, involuntary movement, seizure and syncope.  Psych:    anxiety, depression, insomnia, tearfulness, panic attacks, hallucinations, paranoia, suicidal or homicidal ideation    Past Medical History  Diagnosis Date  . Hypertension   . Glaucoma(365)   . Thyroid disease   . Diabetes mellitus   . Morbid obesity   . Obstructive sleep apnea     Past Surgical History  Procedure Laterality Date  . Abdominal hysterectomy    . Thyroid surgery      Medications:  HOME MEDS: Prior to Admission medications   Medication Sig Start Date End Date Taking? Authorizing Provider  latanoprost (XALATAN) 0.005 % ophthalmic solution Place 1 drop into both eyes 2 (two) times daily.   Yes Historical Provider, MD  levothyroxine (SYNTHROID) 150 MCG tablet Take 1 tablet (150 mcg total) by mouth daily. 01/28/13  Yes Hope Bunnie Pion, NP  metFORMIN (GLUCOPHAGE) 500 MG tablet Take 500 mg by mouth 2 (two) times daily with a meal.  Yes Historical Provider, MD  metoprolol (LOPRESSOR) 50 MG tablet Take 1 tablet (50 mg total) by mouth 2 (two) times daily. 01/28/13  Yes Hope Bunnie Pion, NP  timolol (BETIMOL) 0.5 % ophthalmic solution Place 1 drop into both eyes 2 (two) times daily.   Yes Historical Provider, MD     Allergies:  Allergies  Allergen Reactions  . Aspirin Nausea Only    Social History:   reports that she has never smoked. She does  not have any smokeless tobacco history on file. She reports that she does not drink alcohol or use illicit drugs.  Family History: Family History  Problem Relation Age of Onset  . Diabetes Mother   . Hypertension Mother   . Cancer Mother     pancreas  . Hypertension Sister      Physical Exam: Filed Vitals:   03/04/13 1815 03/04/13 2002 03/04/13 2142 03/04/13 2226  BP: 154/90 162/79  148/89  Pulse: 69 64  68  Temp:    98.2 F (36.8 C)  TempSrc:    Oral  Resp: 18 18  20   Height:    5\' 5"  (1.651 m)  Weight:   147.4 kg (324 lb 15.3 oz) 147.4 kg (324 lb 15.3 oz)  SpO2: 98% 98%  93%   Blood pressure 148/89, pulse 68, temperature 98.2 F (36.8 C), temperature source Oral, resp. rate 20, height 5\' 5"  (1.651 m), weight 147.4 kg (324 lb 15.3 oz), SpO2 93.00%.  GEN:  Pleasant obese African American lady lying bed; seems quite apprehensive and concerned about her condition; reports her symptoms are now quite mild; cooperative with exam; able to move around for the exam without getting dizzy PSYCH:  alert and oriented x4; both anxious or depressed; affect is appropriate. HEENT: Mucous membranes pink and anicteric; PERRLA; EOM intact; no cervical lymphadenopathy nor thyromegaly or carotid bruit; no JVD; marked hirsutism Breasts:: Not examined CHEST WALL: No tenderness CHEST: Normal respiration, clear to auscultation bilaterally HEART: Regular rate and rhythm; no murmurs rubs or gallops BACK: No kyphosis no scoliosis; no CVA tenderness ABDOMEN: Obese, soft non-tender; no masses, no organomegaly, normal abdominal bowel sounds; moderate pannus; no intertriginous candida. Rectal Exam: Not done EXTREMITIES: ; age-appropriate arthropathy of the hands and knees; hairy extremities no edema; no ulcerations. Genitalia: not examined PULSES: 2+ and symmetric SKIN: Normal hydration; acantholsis nigricans, neck and axilla no  ulceration CNS: Cranial nerves 2-12 grossly intact no focal lateralizing  neurologic deficit   Labs on Admission:  Basic Metabolic Panel:  Recent Labs Lab 03/04/13 1720  NA 139  K 4.1  CL 98  CO2 32  GLUCOSE 213*  BUN 9  CREATININE 0.77  CALCIUM 9.3   Liver Function Tests: No results found for this basename: AST, ALT, ALKPHOS, BILITOT, PROT, ALBUMIN,  in the last 168 hours No results found for this basename: LIPASE, AMYLASE,  in the last 168 hours No results found for this basename: AMMONIA,  in the last 168 hours CBC:  Recent Labs Lab 03/04/13 1720  WBC 6.6  NEUTROABS 3.7  HGB 12.9  HCT 39.7  MCV 85.7  PLT 275   Cardiac Enzymes: No results found for this basename: CKTOTAL, CKMB, CKMBINDEX, TROPONINI,  in the last 168 hours BNP: No components found with this basename: POCBNP,  D-dimer: No components found with this basename: D-DIMER,  CBG:  Recent Labs Lab 03/04/13 [place]*    Radiological Exams on Admission: No results found.    Assessment/Plan Present on  Admission:   vertigo probably due to inner ear disorder/ labyrinthitis; seems to have been helped by intravenous Ativan. Possibility also of TIA . Diabetes type 2, uncontrolled . SLEEP APNEA,  Noncompliant with CPAP . Morbid obesity . HYPOTHYROIDISM . HYPERTENSION . GERD   PLAN: Observation admission Continue bed rest and when necessary Ativan, when necessary antiemetic; DVT prophylaxis Continue metformin and sliding scale insulin Social work consult to sort out and explain insurance issues  Other plans as per orders.  Code Status:FULL CODE  Family Communication: Explained to patient and daughter at bedside, outline of plans Disposition Plan: Home in 24 hours if stable; needs primary care physician; possible outpatient open MRI.   Analea Muller Nocturnist [facility] Pager [phone]   03/04/2013, 11:41 PM

## 2013-03-04 NOTE — ED Notes (Addendum)
Pt c/o continued dizziness and nausea. Pt vomited 430ml. edp notified.

## 2013-03-04 NOTE — Progress Notes (Signed)
Spoke with patient about wearing a BIPAP unit tonight;she refused based on the fact she doesn't like the mask on her face and she was very fearful;she felt the mask smothers her in sleep. Patient is currently on 2 LPM [place] and will be montiored through the night for any changes in her status.

## 2013-03-05 DIAGNOSIS — I10 Essential (primary) hypertension: Secondary | ICD-10-CM | POA: Diagnosis not present

## 2013-03-05 DIAGNOSIS — IMO0001 Reserved for inherently not codable concepts without codable children: Secondary | ICD-10-CM | POA: Diagnosis not present

## 2013-03-05 DIAGNOSIS — H8309 Labyrinthitis, unspecified ear: Secondary | ICD-10-CM | POA: Diagnosis not present

## 2013-03-05 DIAGNOSIS — E039 Hypothyroidism, unspecified: Secondary | ICD-10-CM

## 2013-03-05 LAB — HEPATIC FUNCTION PANEL
ALT: 25 U/L (ref 0–35)
AST: 20 U/L (ref 0–37)
Albumin: 3.6 g/dL (ref 3.5–5.2)
Total Bilirubin: 0.7 mg/dL (ref 0.3–1.2)

## 2013-03-05 LAB — BASIC METABOLIC PANEL
Calcium: 8.7 mg/dL (ref 8.4–10.5)
GFR calc non Af Amer: 82 mL/min — ABNORMAL LOW (ref >90)
Potassium: 3.5 mEq/L (ref 3.5–5.1)
Sodium: 139 mEq/L (ref 135–145)

## 2013-03-05 LAB — GLUCOSE, CAPILLARY
Glucose-Capillary: 189 mg/dL — ABNORMAL HIGH (ref 70–99)
Glucose-Capillary: 256 mg/dL — ABNORMAL HIGH (ref 70–99)

## 2013-03-05 LAB — CBC
Platelets: 248 10*3/uL (ref 150–400)
RBC: 4.28 MIL/uL (ref 3.87–5.11)
WBC: 6.7 10*3/uL (ref 4.0–10.5)

## 2013-03-05 LAB — HEMOGLOBIN A1C: Hgb A1c MFr Bld: 10.1 % — ABNORMAL HIGH (ref <5.7)

## 2013-03-05 LAB — TSH: TSH: 16.106 u[IU]/mL — ABNORMAL HIGH (ref 0.350–4.500)

## 2013-03-05 MED ORDER — ACETAMINOPHEN 325 MG PO TABS: 650.0000 mg | ORAL_TABLET | ORAL | Status: DC | PRN

## 2013-03-05 MED ORDER — METFORMIN HCL 1000 MG PO TABS: 1000.0000 mg | ORAL_TABLET | Freq: Two times a day (BID) | ORAL | Status: DC

## 2013-03-05 MED ORDER — LEVOTHYROXINE SODIUM 150 MCG PO TABS: 150.0000 ug | ORAL_TABLET | Freq: Every day | ORAL | Status: DC

## 2013-03-05 MED ORDER — MECLIZINE HCL 32 MG PO TABS: 32.0000 mg | ORAL_TABLET | Freq: Three times a day (TID) | ORAL | Status: DC | PRN

## 2013-03-05 MED ORDER — METOPROLOL TARTRATE 50 MG PO TABS: 50.0000 mg | ORAL_TABLET | Freq: Two times a day (BID) | ORAL | Status: DC

## 2013-03-05 NOTE — Progress Notes (Signed)
Saline lock removed. No complaints of any distress. Discharge instructions given. Prescriptions given. Pt verbalized understanding of instructions. Left floor via wheelchair with family member and nursing staff. Discharged home.

## 2013-03-05 NOTE — Evaluation (Signed)
Physical Therapy Evaluation Patient Details Name: Maureen Jackson MRN: [id number] DOB: 03-12-1956 Today's Date: 03/05/2013 Time: 0930-1010 PT Time Calculation (min): 40 min  PT Assessment / Plan / Recommendation Clinical Impression  Pt was seen for evaluation.  She reports her dizziness to be very mild and it had no relationship to the position of her head.  I saw no nystagmus with position of gaze.  Her major c/o was of knee pain, R>L, wheich limits her mobility at home.  She also c/o DOE.  Her quadriceps strength on the R was ony 4-/5 and combined with her obesity puts her knee at risk of buckling.  I instructed her in gait with a walker and this enabled her to walk more easily.  She did have DOE, but O2 sat was 95% after gait (on RA).  At the end of my visit, she had no dizziness.  I do not see any need for further PT.  (She does also c/o hearing loss...does she have any wax buildup in her ears?).          PT Assessment  Patent does not need any further PT services    Follow Up Recommendations       Does the patient have the potential to tolerate intense rehabilitation      Barriers to Discharge        Equipment Recommendations  Rolling walker with 5" wheels    Recommendations for Other Services     Frequency      Precautions / Restrictions Precautions Precautions: Fall Restrictions Weight Bearing Restrictions: No   Pertinent Vitals/Pain       Mobility  Bed Mobility Bed Mobility: Supine to Sit;Sit to Supine Supine to Sit: 6: Modified independent (Device/Increase time);HOB flat Sit to Supine: 6: Modified independent (Device/Increase time);HOB flat Transfers Transfers: Sit to Stand;Stand to Sit Sit to Stand: 6: Modified independent (Device/Increase time);From bed;From toilet;With upper extremity assist Stand to Sit: 6: Modified independent (Device/Increase time);With upper extremity assist;To toilet;To bed Details for Transfer Assistance: pt was initially very afraid to  stand for fear of dizziness leading to a fall...she discovered that she did not have any significant dizziness with stance and thereafter her transfers became much easier Ambulation/Gait Ambulation/Gait Assistance: 5: Supervision Ambulation Distance (Feet): 80 Feet Assistive device: Rolling walker (walker given to ease knee stress) Gait Pattern: Within Functional Limits General Gait Details: instruction given to keep head erect using teach back method Stairs: No Wheelchair Mobility Wheelchair Mobility: No    Exercises     PT Diagnosis:    PT Problem List:   PT Treatment Interventions:     PT Goals    Visit Information  Last PT Received On: 03/05/13    Subjective Data  Subjective: dizziness is almost gone...knees have been hurting and giving way Patient Stated Goal: none stated   Prior Functioning  Home Living Lives With: Alone (daughter with her 50% of the time) Available Help at Discharge: Available PRN/intermittently Type of Home: Apartment Home Access: Level entry Home Layout: One level Bathroom Shower/Tub: Chiropodist: Standard Home Adaptive Equipment: None Prior Function Level of Independence: Independent Able to Take Stairs?: No Driving: No Vocation: On disability Communication Communication: No difficulties    Cognition  Cognition Overall Cognitive Status: Appears within functional limits for tasks assessed/performed Arousal/Alertness: Awake/alert Orientation Level: Appears intact for tasks assessed Behavior During Session: [facility] for tasks performed    Extremity/Trunk Assessment Right Lower Extremity Assessment RLE ROM/Strength/Tone: [facility] for tasks assessed;Deficits RLE ROM/Strength/Tone  Deficits: quadriceps strength is 4-/5 RLE Sensation: WFL - Light Touch RLE Coordination: WFL - gross motor Left Lower Extremity Assessment LLE ROM/Strength/Tone: WFL for tasks assessed LLE Sensation: WFL - Light Touch LLE Coordination: WFL - gross  motor Trunk Assessment Trunk Assessment: Normal   Balance Balance Balance Assessed: Yes (WNL by functional observation)  End of Session PT - End of Session Equipment Utilized During Treatment: Gait belt Activity Tolerance: Patient tolerated treatment well Patient left: in bed;with call bell/phone within reach;with bed alarm set Nurse Communication: Mobility status  GP Functional Assessment Tool Used: clinical judgement Functional Limitation: Mobility: Walking and moving around Mobility: Walking and Moving Around Current Status [id number]): At least 1 percent but less than 20 percent impaired, limited or restricted Mobility: Walking and Moving Around Goal Status [phone]): At least 1 percent but less than 20 percent impaired, limited or restricted Mobility: Walking and Moving Around Discharge Status [id number]): At least 1 percent but less than 20 percent impaired, limited or restricted   Maureen Jackson 03/05/2013, 10:24 AM

## 2013-03-05 NOTE — Progress Notes (Signed)
UR Chart Review Completed  

## 2013-03-05 NOTE — Discharge Summary (Signed)
Physician Discharge Summary  Patient ID: Maureen Jackson MRN: [id number] DOB/AGE: May 01, 1956 57 y.o.  Admit date: 03/04/2013 Discharge date: 03/05/2013  Discharge Diagnoses:  Principal Problem:   Labyrinthitis Active Problems:   HYPERTENSION   Diabetes type 2, uncontrolled   HYPOTHYROIDISM   GERD   SLEEP APNEA   THYROIDECTOMY, HX OF   Morbid obesity     Medication List    STOP taking these medications       triamcinolone cream 0.1 %  Commonly known as:  KENALOG      TAKE these medications       acetaminophen 325 MG tablet  Commonly known as:  TYLENOL  Take 2 tablets (650 mg total) by mouth every 4 (four) hours as needed.     latanoprost 0.005 % ophthalmic solution  Commonly known as:  XALATAN  Place 1 drop into both eyes 2 (two) times daily.     levothyroxine 150 MCG tablet  Commonly known as:  SYNTHROID  Take 1 tablet (150 mcg total) by mouth daily.     meclizine 32 MG tablet  Commonly known as:  ANTIVERT  Take 1 tablet (32 mg total) by mouth 3 (three) times daily as needed for dizziness or nausea.     metFORMIN 1000 MG tablet  Commonly known as:  GLUCOPHAGE  Take 1 tablet (1,000 mg total) by mouth 2 (two) times daily with a meal.     metoprolol 50 MG tablet  Commonly known as:  LOPRESSOR  Take 1 tablet (50 mg total) by mouth 2 (two) times daily.     timolol 0.5 % ophthalmic solution  Commonly known as:  BETIMOL  Place 1 drop into both eyes 2 (two) times daily.            Discharge Orders   Future Orders Complete By Expires     Activity as tolerated - No restrictions  As directed     Diet - low sodium heart healthy  As directed     Diet Carb Modified  As directed        Follow-up Information   Follow up with HASANAJ,XAJE A, MD In 2 weeks. (or alternate primary care physician)       Disposition: 01-Home or Self Care  Discharged Condition: stable  Consults:  none  Labs:   Results for orders placed during the hospital encounter of  03/04/13 (from the past 48 hour(s))  CBC WITH DIFFERENTIAL     Status: Abnormal   Collection Time    03/04/13  5:20 PM      Result Value Range   WBC 6.6  4.0 - 10.5 K/uL   RBC 4.63  3.87 - 5.11 MIL/uL   Hemoglobin 12.9  12.0 - 15.0 g/dL   HCT 39.7  36.0 - 46.0 %   MCV 85.7  78.0 - 100.0 fL   MCH 27.9  26.0 - 34.0 pg   MCHC 32.5  30.0 - 36.0 g/dL   RDW 13.2  11.5 - 15.5 %   Platelets 275  150 - 400 K/uL   Neutrophils Relative 57  43 - 77 %   Neutro Abs 3.7  1.7 - 7.7 K/uL   Lymphocytes Relative 32  12 - 46 %   Lymphs Abs 2.1  0.7 - 4.0 K/uL   Monocytes Relative 5  3 - 12 %   Monocytes Absolute 0.3  0.1 - 1.0 K/uL   Eosinophils Relative 7 (*) 0 - 5 %   Eosinophils Absolute 0.4  0.0 - 0.7 K/uL   Basophils Relative 0  0 - 1 %   Basophils Absolute 0.0  0.0 - 0.1 K/uL  BASIC METABOLIC PANEL     Status: Abnormal   Collection Time    03/04/13  5:20 PM      Result Value Range   Sodium 139  135 - 145 mEq/L   Potassium 4.1  3.5 - 5.1 mEq/L   Chloride 98  96 - 112 mEq/L   CO2 32  19 - 32 mEq/L   Glucose, Bld 213 (*) 70 - 99 mg/dL   BUN 9  6 - 23 mg/dL   Creatinine, Ser 0.77  0.50 - 1.10 mg/dL   Calcium 9.3  8.4 - 10.5 mg/dL   GFR calc non Af Amer >90  >90 mL/min   GFR calc Af Amer >90  >90 mL/min   Comment:            The eGFR has been calculated     using the CKD EPI equation.     This calculation has not been     validated in all clinical     situations.     eGFR's persistently     <90 mL/min signify     possible Chronic Kidney Disease.  GLUCOSE, CAPILLARY     Status: Abnormal   Collection Time    03/04/13 10:25 PM      Result Value Range   Glucose-Capillary 205 (*) 70 - 99 mg/dL   Comment 1 Notify RN     Comment 2 Documented in Chart    URINALYSIS, ROUTINE W REFLEX MICROSCOPIC     Status: None   Collection Time    03/04/13 11:40 PM      Result Value Range   Color, Urine YELLOW  YELLOW   APPearance CLEAR  CLEAR   Specific Gravity, Urine 1.025  1.005 - 1.030   pH 6.0   5.0 - 8.0   Glucose, UA NEGATIVE  NEGATIVE mg/dL   Hgb urine dipstick NEGATIVE  NEGATIVE   Bilirubin Urine NEGATIVE  NEGATIVE   Ketones, ur NEGATIVE  NEGATIVE mg/dL   Protein, ur NEGATIVE  NEGATIVE mg/dL   Urobilinogen, UA 0.2  0.0 - 1.0 mg/dL   Nitrite NEGATIVE  NEGATIVE   Leukocytes, UA NEGATIVE  NEGATIVE   Comment: MICROSCOPIC NOT DONE ON URINES WITH NEGATIVE PROTEIN, BLOOD, LEUKOCYTES, NITRITE, OR GLUCOSE <1000 mg/dL.  HEPATIC FUNCTION PANEL     Status: Abnormal   Collection Time    03/04/13 11:49 PM      Result Value Range   Total Protein 7.9  6.0 - 8.3 g/dL   Albumin 3.6  3.5 - 5.2 g/dL   AST 20  0 - 37 U/L   ALT 25  0 - 35 U/L   Alkaline Phosphatase 182 (*) 39 - 117 U/L   Total Bilirubin 0.7  0.3 - 1.2 mg/dL   Bilirubin, Direct 0.1  0.0 - 0.3 mg/dL   Indirect Bilirubin 0.6  0.3 - 0.9 mg/dL  MAGNESIUM     Status: None   Collection Time    03/04/13 11:49 PM      Result Value Range   Magnesium 2.0  1.5 - 2.5 mg/dL  BASIC METABOLIC PANEL     Status: Abnormal   Collection Time    03/05/13  5:34 AM      Result Value Range   Sodium 139  135 - 145 mEq/L   Potassium 3.5  3.5 - 5.1  mEq/L   Chloride 103  96 - 112 mEq/L   CO2 30  19 - 32 mEq/L   Glucose, Bld 222 (*) 70 - 99 mg/dL   BUN 10  6 - 23 mg/dL   Creatinine, Ser 0.80  0.50 - 1.10 mg/dL   Calcium 8.7  8.4 - 10.5 mg/dL   GFR calc non Af Amer 82 (*) >90 mL/min   GFR calc Af Amer >90  >90 mL/min   Comment:            The eGFR has been calculated     using the CKD EPI equation.     This calculation has not been     validated in all clinical     situations.     eGFR's persistently     <90 mL/min signify     possible Chronic Kidney Disease.  CBC     Status: None   Collection Time    03/05/13  5:34 AM      Result Value Range   WBC 6.7  4.0 - 10.5 K/uL   RBC 4.28  3.87 - 5.11 MIL/uL   Hemoglobin 12.2  12.0 - 15.0 g/dL   HCT 37.1  36.0 - 46.0 %   MCV 86.7  78.0 - 100.0 fL   MCH 28.5  26.0 - 34.0 pg   MCHC 32.9   30.0 - 36.0 g/dL   RDW 13.4  11.5 - 15.5 %   Platelets 248  150 - 400 K/uL  GLUCOSE, CAPILLARY     Status: Abnormal   Collection Time    03/05/13  8:06 AM      Result Value Range   Glucose-Capillary 189 (*) 70 - 99 mg/dL   Comment 1 Notify RN    GLUCOSE, CAPILLARY     Status: Abnormal   Collection Time    03/05/13 11:15 AM      Result Value Range   Glucose-Capillary 256 (*) 70 - 99 mg/dL   Comment 1 Notify RN     Comment 2 Documented in Chart      Diagnostics:  No results found.  Full Code   Hospital Course: See H&P for complete admission details. The patient is a 57 year old black female who presented with vertigo. She was resistant to the MRI do to claustrophobia. She improved with Ativan in the emergency room but continued to have difficulty ambulating and was therefore placed on observation. She was given supportive care, Ativan as needed. She is currently feeling much better, able to ambulate to the bathroom. No evidence of stroke symptoms. Stable for discharge.  Discharge Exam:  Blood pressure 110/64, pulse 63, temperature 98.4 F (36.9 C), temperature source Oral, resp. rate 20, height 5\' 5"  (1.651 m), weight 147.3 kg (324 lb 11.8 oz), SpO2 98.00%.  General: Comfortable. Eating lunch. Lungs clear to auscultation bilaterally without wheeze rhonchi or rales Cardiovascular regular rate rhythm without murmurs gallops or rubs Abdomen soft nontender Extremities no clubbing cyanosis or edema Neurologic cranial nerves sensory motor exam intact.  SignedDelfina Jackson 03/05/2013, 11:22 AM

## 2013-10-15 ENCOUNTER — Emergency Department (HOSPITAL_COMMUNITY)
Admission: EM | Admit: 2013-10-15 | Discharge: 2013-10-15 | Disposition: A | Discharge status: Home / Self Care | Payer: Medicare Other | Attending: Emergency Medicine | Admitting: Emergency Medicine

## 2013-10-15 ENCOUNTER — Encounter (HOSPITAL_COMMUNITY): Payer: Self-pay | Admitting: Emergency Medicine

## 2013-10-15 DIAGNOSIS — Z79899 Other long term (current) drug therapy: Secondary | ICD-10-CM | POA: Insufficient documentation

## 2013-10-15 DIAGNOSIS — Z760 Encounter for issue of repeat prescription: Secondary | ICD-10-CM | POA: Diagnosis not present

## 2013-10-15 DIAGNOSIS — E119 Type 2 diabetes mellitus without complications: Secondary | ICD-10-CM | POA: Diagnosis not present

## 2013-10-15 DIAGNOSIS — E039 Hypothyroidism, unspecified: Secondary | ICD-10-CM | POA: Diagnosis not present

## 2013-10-15 DIAGNOSIS — R5381 Other malaise: Secondary | ICD-10-CM | POA: Diagnosis not present

## 2013-10-15 DIAGNOSIS — G43909 Migraine, unspecified, not intractable, without status migrainosus: Secondary | ICD-10-CM | POA: Insufficient documentation

## 2013-10-15 DIAGNOSIS — I10 Essential (primary) hypertension: Secondary | ICD-10-CM | POA: Diagnosis not present

## 2013-10-15 DIAGNOSIS — R05 Cough: Secondary | ICD-10-CM | POA: Insufficient documentation

## 2013-10-15 DIAGNOSIS — H409 Unspecified glaucoma: Secondary | ICD-10-CM | POA: Insufficient documentation

## 2013-10-15 DIAGNOSIS — R059 Cough, unspecified: Secondary | ICD-10-CM | POA: Diagnosis not present

## 2013-10-15 MED ORDER — METOPROLOL TARTRATE 50 MG PO TABS: 50.0000 mg | ORAL_TABLET | Freq: Two times a day (BID) | ORAL | Status: DC

## 2013-10-15 MED ORDER — LEVOTHYROXINE SODIUM 150 MCG PO TABS: 150.0000 ug | ORAL_TABLET | Freq: Two times a day (BID) | ORAL | Status: DC

## 2013-10-15 MED ORDER — METOPROLOL TARTRATE 50 MG PO TABS
50.0000 mg | ORAL_TABLET | Freq: Once | ORAL | Status: AC
  Administered 2013-10-15: 50 mg via ORAL
  Filled 2013-10-15: qty 1

## 2013-10-15 MED ORDER — LEVOTHYROXINE SODIUM 150 MCG PO TABS
150.0000 ug | ORAL_TABLET | Freq: Every day | ORAL | Status: DC
  Filled 2013-10-15 (×2): qty 1

## 2013-10-15 MED ORDER — LEVOTHYROXINE SODIUM 150 MCG PO TABS
ORAL_TABLET | ORAL | Status: AC
  Filled 2013-10-15: qty 1

## 2013-10-15 NOTE — ED Notes (Signed)
Pt alert & oriented x4, stable gait. Patient given discharge instructions, paperwork & prescription(s). Patient  instructed to stop at the registration desk to finish any additional paperwork. Patient verbalized understanding. Pt left department w/ no further questions. 

## 2013-10-15 NOTE — ED Notes (Addendum)
Pt reports being sick w/ body aches, weakness and cough that started today. Hard to catch her breath at times. Sob when she walks and her bs has been high for several days.  Out of her bp and thyroid meds.

## 2013-10-15 NOTE — ED Provider Notes (Signed)
CSN: [id number]     Arrival date & time 10/15/13  1621 History   First MD Initiated Contact with Patient 10/15/13 1703     Chief Complaint  Patient presents with  . Generalized Body Aches  . Weakness  . Nausea  . Cough  . Medication Refill  . Hyperglycemia   (Consider location/radiation/quality/duration/timing/severity/associated sxs/prior Treatment) HPI.... generalized weakness, achiness, migraine headache for several days.  Patient has been not taking her thyroid medication blood pressure medication. She is morbidly obese. Severity is mild to moderate. Nothing makes symptoms better or worse. No chest pain, dyspnea neurological deficits, dysuria, stiff neck  Past Medical History  Diagnosis Date  . Hypertension   . Glaucoma   . Thyroid disease   . Diabetes mellitus   . Morbid obesity   . Obstructive sleep apnea    Past Surgical History  Procedure Laterality Date  . Abdominal hysterectomy    . Thyroid surgery     Family History  Problem Relation Age of Onset  . Diabetes Mother   . Hypertension Mother   . Cancer Mother     pancreas  . Hypertension Sister    History  Substance Use Topics  . Smoking status: Never Smoker   . Smokeless tobacco: Never Used  . Alcohol Use: No   OB History   Grav Para Term Preterm Abortions TAB SAB Ect Mult Living                 Review of Systems  All other systems reviewed and are negative.    Allergies  Aspirin  Home Medications   Current Outpatient Rx  Name  Route  Sig  Dispense  Refill  . acetaminophen (TYLENOL) 325 MG tablet   Oral   Take 650 mg by mouth every 4 (four) hours as needed for mild pain.         Marland Kitchen latanoprost (XALATAN) 0.005 % ophthalmic solution   Both Eyes   Place 1 drop into both eyes 2 (two) times daily.         Marland Kitchen levothyroxine (SYNTHROID) 150 MCG tablet   Oral   Take 1 tablet (150 mcg total) by mouth daily.   30 tablet   0   . metFORMIN (GLUCOPHAGE) 1000 MG tablet   Oral   Take 1 tablet  (1,000 mg total) by mouth 2 (two) times daily with a meal.   60 tablet   0   . metoprolol (LOPRESSOR) 50 MG tablet   Oral   Take 1 tablet (50 mg total) by mouth 2 (two) times daily.   60 tablet   0   . timolol (BETIMOL) 0.5 % ophthalmic solution   Both Eyes   Place 1 drop into both eyes 2 (two) times daily.         Marland Kitchen levothyroxine (SYNTHROID) 150 MCG tablet   Oral   Take 1 tablet (150 mcg total) by mouth 2 (two) times daily.   60 tablet   1   . meclizine (ANTIVERT) 32 MG tablet   Oral   Take 1 tablet (32 mg total) by mouth 3 (three) times daily as needed for dizziness or nausea.   20 tablet   0   . metoprolol (LOPRESSOR) 50 MG tablet   Oral   Take 1 tablet (50 mg total) by mouth 2 (two) times daily.   60 tablet   1    BP 169/108  Pulse 85  Temp(Src) 98.1 F (36.7 C) (Oral)  Resp 24  Ht 5\' 5"  (1.651 m)  Wt 330 lb (149.687 kg)  BMI 54.91 kg/m2  SpO2 96% Physical Exam  Nursing note and vitals reviewed. Constitutional: She is oriented to person, place, and time. She appears well-developed and well-nourished.  Morbidly obese  HENT:  Head: Normocephalic and atraumatic.  Eyes: Conjunctivae and EOM are normal. Pupils are equal, round, and reactive to light.  Neck: Normal range of motion. Neck supple.  Cardiovascular: Normal rate, regular rhythm and normal heart sounds.   Pulmonary/Chest: Effort normal and breath sounds normal.  Abdominal: Soft. Bowel sounds are normal.  Musculoskeletal: Normal range of motion.  Neurological: She is alert and oriented to person, place, and time.  Skin: Skin is warm and dry.  Psychiatric: She has a normal mood and affect.    ED Course  Procedures (including critical care time) Labs Review Labs Reviewed  GLUCOSE, CAPILLARY - Abnormal; Notable for the following:    Glucose-Capillary 174 (*)    All other components within normal limits   Imaging Review No results found.  EKG Interpretation   None       MDM   1.  Hypothyroidism   2. Hypertension   3. Morbid obesity    Patient has been noncompliant with her medications.   Will refill her Synthroid and atenolol.  She has primary care followup soon.  Patient has comfortable with this plan   Nat Christen, MD 10/15/13 [phone]

## 2014-04-20 ENCOUNTER — Encounter (HOSPITAL_COMMUNITY): Payer: Self-pay | Admitting: Emergency Medicine

## 2014-04-20 ENCOUNTER — Emergency Department (INDEPENDENT_AMBULATORY_CARE_PROVIDER_SITE_OTHER)
Admission: EM | Admit: 2014-04-20 | Discharge: 2014-04-20 | Disposition: A | Discharge status: Home / Self Care | Payer: Medicare Other | Source: Home / Self Care | Attending: Family Medicine | Admitting: Family Medicine

## 2014-04-20 DIAGNOSIS — L0390 Cellulitis, unspecified: Secondary | ICD-10-CM | POA: Diagnosis not present

## 2014-04-20 DIAGNOSIS — L0291 Cutaneous abscess, unspecified: Secondary | ICD-10-CM | POA: Diagnosis not present

## 2014-04-20 DIAGNOSIS — IMO0002 Reserved for concepts with insufficient information to code with codable children: Secondary | ICD-10-CM

## 2014-04-20 MED ORDER — LEVOTHYROXINE SODIUM 150 MCG PO TABS: 150.0000 ug | ORAL_TABLET | Freq: Every day | ORAL | Status: DC

## 2014-04-20 MED ORDER — KETOROLAC TROMETHAMINE 60 MG/2ML IM SOLN: 60.0000 mg | Freq: Once | INTRAMUSCULAR | Status: DC

## 2014-04-20 MED ORDER — METHYLPREDNISOLONE ACETATE 40 MG/ML IJ SUSP: 80.0000 mg | Freq: Once | INTRAMUSCULAR | Status: DC

## 2014-04-20 MED ORDER — METOPROLOL TARTRATE 50 MG PO TABS: 50.0000 mg | ORAL_TABLET | Freq: Two times a day (BID) | ORAL | Status: DC

## 2014-04-20 MED ORDER — SULFAMETHOXAZOLE-TMP DS 800-160 MG PO TABS: 2.0000 | ORAL_TABLET | Freq: Two times a day (BID) | ORAL | Status: DC

## 2014-04-20 MED ORDER — TRIAMCINOLONE ACETONIDE 40 MG/ML IJ SUSP: 40.0000 mg | Freq: Once | INTRAMUSCULAR | Status: DC

## 2014-04-20 NOTE — Discharge Instructions (Signed)
Paronychia Paronychia is an inflammatory reaction involving the folds of the skin surrounding the fingernail. This is commonly caused by an infection in the skin around a nail. The most common cause of paronychia is frequent wetting of the hands (as seen with bartenders, food servers, nurses or others who wet their hands). This makes the skin around the fingernail susceptible to infection by bacteria (germs) or fungus. Other predisposing factors are:  Aggressive manicuring.  Nail biting.  Thumb sucking. The most common cause is a staphylococcal (a type of germ) infection, or a fungal (Candida) infection. When caused by a germ, it usually comes on suddenly with redness, swelling, pus and is often painful. It may get under the nail and form an abscess (collection of pus), or form an abscess around the nail. If the nail itself is infected with a fungus, the treatment is usually prolonged and may require oral medicine for up to one year. Your caregiver will determine the length of time treatment is required. The paronychia caused by bacteria (germs) may largely be avoided by not pulling on hangnails or picking at cuticles. When the infection occurs at the tips of the finger it is called felon. When the cause of paronychia is from the herpes simplex virus (HSV) it is called herpetic whitlow. TREATMENT  When an abscess is present treatment is often incision and drainage. This means that the abscess must be cut open so the pus can get out. When this is done, the following home care instructions should be followed. HOME CARE INSTRUCTIONS   It is important to keep the affected fingers very dry. Rubber or plastic gloves over cotton gloves should be used whenever the hand must be placed in water.  Keep wound clean, dry and dressed as suggested by your caregiver between warm soaks or warm compresses.  Soak in warm water for fifteen to twenty minutes three to four times per day for bacterial infections. Fungal  infections are very difficult to treat, so often require treatment for long periods of time.  For bacterial (germ) infections take antibiotics (medicine which kill germs) as directed and finish the prescription, even if the problem appears to be solved before the medicine is gone.  Only take over-the-counter or prescription medicines for pain, discomfort, or fever as directed by your caregiver. SEEK IMMEDIATE MEDICAL CARE IF:  You have redness, swelling, or increasing pain in the wound.  You notice pus coming from the wound.  You have a fever.  You notice a bad smell coming from the wound or dressing. Document Released: 05/23/2001 Document Revised: 02/19/2012 Document Reviewed: 01/22/2009 [facility] Patient Information 2014 [place].  Cellulitis Cellulitis is an infection of the skin and the tissue beneath it. The infected area is usually red and tender. Cellulitis occurs most often in the arms and lower legs.  CAUSES  Cellulitis is caused by bacteria that enter the skin through cracks or cuts in the skin. The most common types of bacteria that cause cellulitis are Staphylococcus and Streptococcus. SYMPTOMS   Redness and warmth.  Swelling.  Tenderness or pain.  Fever. DIAGNOSIS  Your caregiver can usually determine what is wrong based on a physical exam. Blood tests may also be done. TREATMENT  Treatment usually involves taking an antibiotic medicine. HOME CARE INSTRUCTIONS   Take your antibiotics as directed. Finish them even if you start to feel better.  Keep the infected arm or leg elevated to reduce swelling.  Apply a warm cloth to the affected area up to  4 times per day to relieve pain.  Only take over-the-counter or prescription medicines for pain, discomfort, or fever as directed by your caregiver.  Keep all follow-up appointments as directed by your caregiver. SEEK MEDICAL CARE IF:   You notice red streaks coming from the infected area.  Your red area  gets larger or turns dark in color.  Your bone or joint underneath the infected area becomes painful after the skin has healed.  Your infection returns in the same area or another area.  You notice a swollen bump in the infected area.  You develop new symptoms. SEEK IMMEDIATE MEDICAL CARE IF:   You have a fever.  You feel very sleepy.  You develop vomiting or diarrhea.  You have a general ill feeling (malaise) with muscle aches and pains. MAKE SURE YOU:   Understand these instructions.  Will watch your condition.  Will get help right away if you are not doing well or get worse. Document Released: 09/06/2005 Document Revised: 05/28/2012 Document Reviewed: 02/12/2012 [facility] Patient Information 2014 [place].

## 2014-04-20 NOTE — ED Notes (Signed)
Reports pain and swelling of right middle finger.  Noticed yesterday.  Having drainage that started about 10 min. Ago.  Took tylenol for pain with no relief.  Pt also needing refill on medication. Been with out meds since Saturday.

## 2014-04-20 NOTE — ED Provider Notes (Signed)
CSN: [id number]     Arrival date & time 04/20/14  1846 History   First MD Initiated Contact with Patient 04/20/14 2049     Chief Complaint  Patient presents with  . Hand Problem  . Medication Refill   (Consider location/radiation/quality/duration/timing/severity/associated sxs/prior Treatment) HPI Comments: 58 year old female presents complaining of pain in her right middle finger with redness, swelling, and some purulent drainage. This started as pain and swelling in the lateral nail fold, and since then her entire of her finger has become extremely and painful and swollen. It is constantly throbbing. The pain kept her awake last night because it was so bad. She denies any systemic symptoms at this time. She also needs a refill of her antihypertensive and thyroid medications. She takes metoprolol 50 mg twice a day, and she says she takes Synthroid 150 mcg twice daily.   Past Medical History  Diagnosis Date  . Hypertension   . Glaucoma   . Thyroid disease   . Diabetes mellitus   . Morbid obesity   . Obstructive sleep apnea    Past Surgical History  Procedure Laterality Date  . Abdominal hysterectomy    . Thyroid surgery     Family History  Problem Relation Age of Onset  . Diabetes Mother   . Hypertension Mother   . Cancer Mother     pancreas  . Hypertension Sister    History  Substance Use Topics  . Smoking status: Never Smoker   . Smokeless tobacco: Never Used  . Alcohol Use: No   OB History   Grav Para Term Preterm Abortions TAB SAB Ect Mult Living                 Review of Systems  Musculoskeletal:       See hpi  Skin: Positive for wound.  All other systems reviewed and are negative.   Allergies  Aspirin  Home Medications   Prior to Admission medications   Medication Sig Start Date End Date Taking? Authorizing Provider  levothyroxine (SYNTHROID) 150 MCG tablet Take 1 tablet (150 mcg total) by mouth daily. 03/05/13  Yes Delfina Redwood, MD  metFORMIN  (GLUCOPHAGE) 1000 MG tablet Take 1 tablet (1,000 mg total) by mouth 2 (two) times daily with a meal. 03/05/13  Yes Delfina Redwood, MD  metoprolol (LOPRESSOR) 50 MG tablet Take 1 tablet (50 mg total) by mouth 2 (two) times daily. 03/05/13  Yes Delfina Redwood, MD  acetaminophen (TYLENOL) 325 MG tablet Take 650 mg by mouth every 4 (four) hours as needed for mild pain.    Historical Provider, MD  latanoprost (XALATAN) 0.005 % ophthalmic solution Place 1 drop into both eyes 2 (two) times daily.    Historical Provider, MD  levothyroxine (SYNTHROID) 150 MCG tablet Take 1 tablet (150 mcg total) by mouth 2 (two) times daily. 10/15/13   Nat Christen, MD  meclizine (ANTIVERT) 32 MG tablet Take 1 tablet (32 mg total) by mouth 3 (three) times daily as needed for dizziness or nausea. 03/05/13   Delfina Redwood, MD  metoprolol (LOPRESSOR) 50 MG tablet Take 1 tablet (50 mg total) by mouth 2 (two) times daily. 10/15/13   Nat Christen, MD  timolol (BETIMOL) 0.5 % ophthalmic solution Place 1 drop into both eyes 2 (two) times daily.    Historical Provider, MD   BP 208/96  Pulse 73  Temp(Src) 98.3 F (36.8 C) (Oral)  Resp 16  SpO2 100% Physical Exam  Nursing note and  vitals reviewed. Constitutional: She is oriented to person, place, and time. Vital signs are normal. She appears well-developed and well-nourished. No distress.  HENT:  Head: Normocephalic and atraumatic.  Pulmonary/Chest: Effort normal. No respiratory distress.  Musculoskeletal:       Right hand: She exhibits tenderness. She exhibits normal capillary refill. Normal sensation noted.  Paronychia of the lateral nail fold of the right middle finger. Swelling, erythema, tenderness of the pad of the distal right middle finger  Neurological: She is alert and oriented to person, place, and time. She has normal strength. Coordination normal.  Skin: Skin is warm and dry. No rash noted. She is not diaphoretic.  Psychiatric: She has a normal mood and  affect. Judgment normal.    ED Course  Drain paronychia Date/Time: 04/21/2014 8:19 AM Performed by: Allena Katz, H Authorized by: Allena Katz, H Consent: Verbal consent obtained. Risks and benefits: risks, benefits and alternatives were discussed Consent given by: patient Patient identity confirmed: verbally with patient Time out: Immediately prior to procedure a "time out" was called to verify the correct patient, procedure, equipment, support staff and site/side marked as required. Preparation: Patient was prepped and draped in the usual sterile fashion. Local anesthesia used: yes Anesthesia: digital block Local anesthetic: lidocaine 2% without epinephrine Anesthetic total: 4 ml Patient sedated: no Patient tolerance: Patient tolerated the procedure well with no immediate complications. Comments: The incision into the paronychia was extended into the fat pad of the finger and a small pulp abscess was also expressed. Culture was sent   (including critical care time) Labs Review Labs Reviewed  CULTURE, ROUTINE-ABSCESS    Imaging Review No results found.   MDM   1. Paronychia   2. Cellulitis    Paronychia with cellulitis and a minimal pulp abscess. Incised and drained and placed on antibiotics. Cultures sent. Followup when necessary, if worsening go to ED  Discharge Medication List as of 04/20/2014  9:55 PM    START taking these medications   Details  !! levothyroxine (SYNTHROID) 150 MCG tablet Take 1 tablet (150 mcg total) by mouth daily before breakfast., Starting 04/20/2014, Until Discontinued, Print    !! metoprolol (LOPRESSOR) 50 MG tablet Take 1 tablet (50 mg total) by mouth 2 (two) times daily., Starting 04/20/2014, Until Discontinued, Print    sulfamethoxazole-trimethoprim (BACTRIM DS) 800-160 MG per tablet Take 2 tablets by mouth 2 (two) times daily., Starting 04/20/2014, Until Discontinued, Print     !! - Potential duplicate medications found. Please discuss  with provider.        Liam Graham, PA-C 04/21/14 [phone]

## 2014-04-21 NOTE — ED Provider Notes (Signed)
Medical screening examination/treatment/procedure(s) were performed by resident physician or non-physician practitioner and as supervising physician I was immediately available for consultation/collaboration.   Pauline Good MD.   Billy Fischer, MD 04/21/14 [phone]

## 2014-04-23 LAB — CULTURE, ROUTINE-ABSCESS: Gram Stain: NONE SEEN

## 2014-04-23 NOTE — ED Notes (Signed)
Abscess culture:  Mod. Proteus Mirabilis.  Pt. adequately treated with I and D and Bactrim DS. Hanley Seamen [facility] 04/23/2014

## 2014-08-21 ENCOUNTER — Encounter (HOSPITAL_COMMUNITY): Payer: Self-pay | Admitting: Emergency Medicine

## 2014-08-21 ENCOUNTER — Emergency Department (HOSPITAL_COMMUNITY)
Admission: EM | Admit: 2014-08-21 | Discharge: 2014-08-21 | Disposition: A | Discharge status: Home / Self Care | Payer: Medicare Other | Attending: Emergency Medicine | Admitting: Emergency Medicine

## 2014-08-21 DIAGNOSIS — Z8669 Personal history of other diseases of the nervous system and sense organs: Secondary | ICD-10-CM | POA: Diagnosis not present

## 2014-08-21 DIAGNOSIS — I10 Essential (primary) hypertension: Secondary | ICD-10-CM | POA: Diagnosis not present

## 2014-08-21 DIAGNOSIS — Z9889 Other specified postprocedural states: Secondary | ICD-10-CM | POA: Diagnosis not present

## 2014-08-21 DIAGNOSIS — Z79899 Other long term (current) drug therapy: Secondary | ICD-10-CM | POA: Diagnosis not present

## 2014-08-21 DIAGNOSIS — H409 Unspecified glaucoma: Secondary | ICD-10-CM | POA: Insufficient documentation

## 2014-08-21 DIAGNOSIS — R51 Headache: Secondary | ICD-10-CM | POA: Diagnosis not present

## 2014-08-21 DIAGNOSIS — E119 Type 2 diabetes mellitus without complications: Secondary | ICD-10-CM | POA: Insufficient documentation

## 2014-08-21 DIAGNOSIS — J3489 Other specified disorders of nose and nasal sinuses: Secondary | ICD-10-CM | POA: Insufficient documentation

## 2014-08-21 DIAGNOSIS — Z760 Encounter for issue of repeat prescription: Secondary | ICD-10-CM

## 2014-08-21 DIAGNOSIS — E079 Disorder of thyroid, unspecified: Secondary | ICD-10-CM | POA: Diagnosis not present

## 2014-08-21 DIAGNOSIS — R519 Headache, unspecified: Secondary | ICD-10-CM

## 2014-08-21 MED ORDER — CETIRIZINE-PSEUDOEPHEDRINE ER 5-120 MG PO TB12: 1.0000 | ORAL_TABLET | Freq: Every day | ORAL | Status: DC

## 2014-08-21 MED ORDER — METOPROLOL TARTRATE 50 MG PO TABS: 50.0000 mg | ORAL_TABLET | Freq: Two times a day (BID) | ORAL | Status: DC

## 2014-08-21 MED ORDER — LEVOTHYROXINE SODIUM 150 MCG PO TABS: 150.0000 ug | ORAL_TABLET | Freq: Every day | ORAL | Status: DC

## 2014-08-21 NOTE — ED Provider Notes (Signed)
CSN: [id number]     Arrival date & time 08/21/14  0946 History   First MD Initiated Contact with Patient 08/21/14 1132    This chart was scribed for Fredia Sorrow, MD by Edison Simon, ED Scribe. This patient was seen in room APA11/APA11 and the patient's care was started at 12:00 PM.    Chief Complaint  Patient presents with  . Headache   Patient is a 58 y.o. female presenting with headaches. The history is provided by the patient. No language interpreter was used.  Headache Location: forehead and nose area. Radiates to:  Does not radiate Duration:  2 days Timing:  Constant Chronicity:  New Similar to prior headaches: yes   Relieved by:  Nothing Worsened by:  Nothing tried Ineffective treatments:  Acetaminophen Associated symptoms: congestion   Associated symptoms: no abdominal pain, no back pain, no cough, no diarrhea, no fever, no nausea, no neck pain, no sore throat and no vomiting     HPI Comments: ADRIEANNA Jackson is a 58 y.o. female who presents to the Emergency Department for med refill and complaining of headache to her forehead and nose area with onset 2 days ago. She reports associated congestion, sneezing, and rhinorrhea. She report previous sinus problems. She denies back pain. She states she has been out of her thyroid and blood pressure medications for the past 2 days. She states her PCP will not see her until she pays her deductible. She states she does have her Metformin. She reports taking Tylenol only without remission of symptoms. She reports history of allergies but states she has not had symptoms recently.  Past Medical History  Diagnosis Date  . Hypertension   . Glaucoma   . Thyroid disease   . Diabetes mellitus   . Morbid obesity   . Obstructive sleep apnea    Past Surgical History  Procedure Laterality Date  . Abdominal hysterectomy    . Thyroid surgery     Family History  Problem Relation Age of Onset  . Diabetes Mother   . Hypertension Mother   .  Cancer Mother     pancreas  . Hypertension Sister    History  Substance Use Topics  . Smoking status: Never Smoker   . Smokeless tobacco: Never Used  . Alcohol Use: No   OB History   Grav Para Term Preterm Abortions TAB SAB Ect Mult Living                 Review of Systems  Constitutional: Negative for fever and chills.  HENT: Positive for congestion, rhinorrhea and sneezing. Negative for sore throat.   Eyes: Negative for visual disturbance.  Respiratory: Negative for cough and shortness of breath.   Cardiovascular: Negative for chest pain and leg swelling.  Gastrointestinal: Negative for nausea, vomiting, abdominal pain and diarrhea.  Genitourinary: Negative for dysuria.  Musculoskeletal: Negative for back pain and neck pain.  Skin: Negative for rash.  Neurological: Positive for headaches.  Hematological: Does not bruise/bleed easily.  Psychiatric/Behavioral: Negative for confusion.      Allergies  Aspirin  Home Medications   Prior to Admission medications   Medication Sig Start Date End Date Taking? Authorizing Provider  acetaminophen (TYLENOL) 325 MG tablet Take 650 mg by mouth every 4 (four) hours as needed for mild pain.   Yes Historical Provider, MD  latanoprost (XALATAN) 0.005 % ophthalmic solution Place 1 drop into both eyes 2 (two) times daily.   Yes Historical Provider, MD  metFORMIN (GLUCOPHAGE)  1000 MG tablet Take 1 tablet (1,000 mg total) by mouth 2 (two) times daily with a meal. 03/05/13  Yes Delfina Redwood, MD  metoprolol (LOPRESSOR) 50 MG tablet Take 1 tablet (50 mg total) by mouth 2 (two) times daily. 04/20/14  Yes Freeman Caldron Baker, PA-C  timolol (BETIMOL) 0.5 % ophthalmic solution Place 1 drop into both eyes 2 (two) times daily.   Yes Historical Provider, MD  cetirizine-pseudoephedrine (ZYRTEC-D) 5-120 MG per tablet Take 1 tablet by mouth daily. 08/21/14   Fredia Sorrow, MD  levothyroxine (SYNTHROID) 150 MCG tablet Take 1 tablet (150 mcg total) by  mouth daily before breakfast. 04/20/14   Liam Graham, PA-C  levothyroxine (SYNTHROID, LEVOTHROID) 150 MCG tablet Take 1 tablet (150 mcg total) by mouth daily before breakfast. 08/21/14   Fredia Sorrow, MD  metoprolol (LOPRESSOR) 50 MG tablet Take 1 tablet (50 mg total) by mouth 2 (two) times daily. 08/21/14   Fredia Sorrow, MD   BP 197/114  Pulse 82  Temp(Src) 98.8 F (37.1 C) (Oral)  Resp 18  SpO2 97% Physical Exam  Nursing note and vitals reviewed. Constitutional: She is oriented to person, place, and time. She appears well-developed and well-nourished.  HENT:  Head: Normocephalic and atraumatic.  Eyes: Conjunctivae and EOM are normal.  Neck: Normal range of motion. Neck supple.  Cardiovascular: Normal rate, regular rhythm and normal heart sounds.   No murmur heard. Pulmonary/Chest: Effort normal and breath sounds normal. No respiratory distress. She has no wheezes. She has no rales.  Abdominal: Soft. Bowel sounds are normal. There is no tenderness.  Musculoskeletal: Normal range of motion. She exhibits no edema.  Neurological: She is alert and oriented to person, place, and time. No cranial nerve deficit. She exhibits normal muscle tone. Coordination normal.  Skin: Skin is warm and dry.  Psychiatric: She has a normal mood and affect.    ED Course  Procedures (including critical care time) Labs Review Labs Reviewed - No data to display  Imaging Review No results found.   EKG Interpretation None     DIAGNOSTIC STUDIES: Oxygen Saturation is 99% on room air, normal by my interpretation.    COORDINATION OF CARE:    MDM   Final diagnoses:  Sinus headache  Encounter for medication refill    Patient nontoxic no acute distress. Clinically has some sinusitis. No palpable tenderness to the frontal sinus or maxillary sinuses though. Patient also needs renewal of her Synthroid and her hypertensive meds both have been renewed. Patient restarted on Zyrtec. Symptoms have  been for the last few weeks do sound as if there is an allergic component patient has history of seasonal allergies. Patient has been out of her blood pressure medicine for the past 2 days. Patient without severe headache stroke symptoms chest pain or shortness of breath.   I personally performed the services described in this documentation, which was scribed in my presence. The recorded information has been reviewed and is accurate.     Fredia Sorrow, MD 08/21/14 1245

## 2014-08-21 NOTE — ED Notes (Signed)
Patient c/o frontal HA, pressure in character.  Denies fevers, sore throat.  Some mild rhinorrhea and R ear pains.  TMs intact, w/out bulging,  + light reflex bilaterally. Slight tenderness on palpation of frontal sinuses.

## 2014-08-21 NOTE — ED Notes (Addendum)
Pt states has had headache x 2 days, also co head congestion, denies n/v. BP elevated in triage, pt has not taken BP meds "in awhile because my doctor won't see me to fill my prescriptions unless i pay my deductible".

## 2014-08-21 NOTE — ED Notes (Signed)
MD at bedside. 

## 2014-08-21 NOTE — ED Notes (Signed)
Having headache since Sunday.  Having right ear ache, runny nose, sneezing, watery and itching eyes.  Rates pain 5.  Took tylenol with mild relief.

## 2014-08-21 NOTE — ED Notes (Signed)
Patient with no complaints at this time. Respirations even and unlabored. Skin warm/dry. Discharge instructions reviewed with patient at this time. Patient given opportunity to voice concerns/ask questions. Patient discharged at this time and left Emergency Department with steady gait.   

## 2014-08-21 NOTE — Discharge Instructions (Signed)
Take the Zyrtec as needed for the sinus problems. We have renewed her Synthroid and your Lopressor. Resource guide provided below to help you find [facility] care Dr. Otherwise continue seeing your Dr. In [place].    Emergency Department Resource Guide 1) Find a Doctor and Pay Out of Pocket Although you won't have to find out who is covered by your insurance plan, it is a good idea to ask around and get recommendations. You will then need to call the office and see if the doctor you have chosen will accept you as a new patient and what types of options they offer for patients who are self-pay. Some doctors offer discounts or will set up payment plans for their patients who do not have insurance, but you will need to ask so you aren't surprised when you get to your appointment.  2) Contact Your Local Health Department Not all health departments have doctors that can see patients for sick visits, but many do, so it is worth a call to see if yours does. If you don't know where your local health department is, you can check in your phone book. The CDC also has a tool to help you locate your state's health department, and many state websites also have listings of all of their local health departments.  3) Find a [facility] If your illness is not likely to be very severe or complicated, you may want to try a walk in clinic. These are popping up all over the country in pharmacies, drugstores, and shopping centers. They're usually staffed by nurse practitioners or physician assistants that have been trained to treat common illnesses and complaints. They're usually fairly quick and inexpensive. However, if you have serious medical issues or chronic medical problems, these are probably not your best option.  No Primary Care Doctor: - Call Health Connect at  [phone] - they can help you locate a primary care doctor that  accepts your insurance, provides certain services, etc. - Physician Referral Service-  [phone]  Chronic Pain Problems: Organization         Address  Phone   Notes  [facility]  [phone] Patients need to be referred by their primary care doctor.   Medication Assistance: Organization         Address  Phone   Notes  [facility] Medication [facility] [address] [place], [place] [address] [phone] --Must be a resident of [facility] -- Must have NO insurance coverage whatsoever (no Medicaid/ Medicare, etc.) -- The pt. MUST have a primary care doctor that directs their care regularly and follows them in the community   MedAssist  [phone]   [employer]  [phone]    Agencies that provide inexpensive medical care: Organization         Address  Phone   Notes  [place]  [phone]   [facility]    [phone]   [facility] [address] [place], [place] [address] [phone]   [place] [address], [place] [phone]   Planned Parenthood    [phone]   [facility]    [phone]   [place] and [place] [address], [place] Phone:  [phone], Fax:  [phone] Hours of Operation:  9 am - 6 pm, M-F.  Also accepts Medicaid/Medicare and self-pay.  [place]  Center for [place], [address], [place] Phone: [phone], Fax: [phone]. Hours of Operation:  8:30 am - 5:30 pm, M-F.  Also accepts Medicaid and self-pay.  [facility] [place] [address] [place] Phone: [phone]   [place], [place], [place] [phone], Ext. [phone] Mondays & Thursdays: 7-9 AM.  First 15 patients are seen on a first come, first serve basis.    [place] Providers:  Organization         Address  Phone   Notes  [facility] [address],  [place] [phone] Also accepts self-pay patients.  [facility] [id number] [place], [place]  [phone]   [place], [address], [place] [phone]   [facility] [address] [place] [phone]   Lucianne Lei [address], [place]   [phone] Only accepts [place] Access [place] patients after they have their name applied to their card.   Self-Pay (no insurance) in [facility]:  Organization         Address  Phone   Notes  Sickle Cell Patients, [facility] [place] [phone]   [facility] Urgent Care [place] [phone]   [facility] [place]  [place], [place], [place] [phone]   [facility]/Dr. Osei-Bonsu  [address], [place] or [address], [place] [phone] Phone number for both [place] and [place] locations is the same.  [facility] [address] [place] [phone]   [facility] [address], [place] or [address] [phone]   [facility] [address], [place] [phone], phone; [phone], fax Sees patients 1st and 3rd Saturday of every month.  Must not qualify for public or private insurance (i.e. Medicaid, Medicare, [facility] Health Choice, Veterans' Benefits)  Household income should be no more than 200% of the poverty level The clinic cannot treat you if you are pregnant or think you are pregnant  Sexually transmitted diseases are not treated at the clinic.    Dental Care: Organization         Address  Phone  Notes  [facility] Department of [facility] [place] [phone] Accepts children up to age 102 who are enrolled in  [place] or [place]; pregnant women with a Medicaid card; and children who have applied for Medicaid or [place] Health Choice, but were declined, whose parents can pay a reduced fee at time of service.  [facility] Department of [facility]  [address], [place] [phone] Accepts children up to age 74 who are enrolled in [place] or [place]; pregnant women with a Medicaid card; and children who have applied for Medicaid or [place] Health Choice, but were declined, whose parents can pay a reduced fee at time of service.  [facility] Adult [facility] Access PRO[facility]  [place] [phone] Patients are seen by appointment only. Walk-ins are not accepted. [place] will see patients 40 years of age and older. Monday - Tuesday (8am-5pm) Most Wednesdays (8:30-5pm) $30 per visit, cash  only  [facility] Adult [facility] Access PROGRAM  [address], [facility] [phone] Patients are seen by appointment only. Walk-ins are not accepted. [place] will see patients 14 years of age and older. One Wednesday Evening (Monthly: Volunteer Based).  $30 per visit, cash only  [place]  [phone] for adults; Children under age 48, call Graduate Pediatric Dentistry at [phone]. Children aged 39-14, please call [phone] to request a pediatric application.  Dental services are provided in all areas of dental care including fillings, crowns and bridges, complete and partial dentures, implants, gum treatment, root canals, and extractions. Preventive care is also provided. Treatment is provided to both adults and children. Patients are selected via a lottery and there is often a waiting list.   [facility] [address] [place]  [phone] [email or web address]   [facility] [address] [place], [place] [phone], Ext. [phone] Second and Fourth Thursday of each month, opens at 6:30  AM; Clinic ends at 9 AM.  Patients are seen on a first-come first-served basis, and a limited number are seen during each clinic.   [facility]  [address] Hillard Danker [place], [place] [phone]   Eligibility Requirements You must have lived in [place], [place], or [place] counties for at least the last three months.   You cannot be eligible for state or federal sponsored [employer], including [employer], [place], or [employer].   You generally cannot be eligible for healthcare insurance through your employer.    How to apply: Eligibility screenings are held every Tuesday and Wednesday afternoon from 1:00 pm until 4:00 pm. You do not need an appointment for the interview!  [facility] [address], [place], [place]   [place]  [place] Department  [place]  [phone]    Behavioral Health Resources in the Community: Intensive Outpatient Programs Organization         Address  Phone  Notes  [place]. [address], [place], [place] [phone]   [facility] Outpatient [address] [place], [place]   ADS: Alcohol & Drug Svcs [address], [place], [place]   [place] [address],  [place], [place] or [phone]   Substance Abuse Resources Organization         Address  Phone  Notes  Alcohol and Drug Services  [phone]   [place]  [phone]   The [place]   Chinita Pester  [phone]   Residential & Outpatient Substance Abuse Program  [phone]   Psychological Services Organization         Address  Phone  Notes  [facility] [place]  [place]  [phone]   [place] [address], [place] or  [phone]    Mobile Crisis Teams Organization         Address  Phone  Notes  [facility], Mobile Crisis Care Unit  [phone]   [facility]ive Psychotherapeutic Services  [address] [place], [place]   [place] Levels [address] [place] [phone]    Self-Help/Support Groups Organization         Address  Phone  Notes  Mental Health Assoc. of [place] - variety of support groups  [place] Call for more information  Narcotics Anonymous (NA), [facility] [address], [employer] [place]  2 meetings at this location   Special educational needs teacher         Address  Phone  Notes  ASAP Residential Treatment [place],    [place]  [phone]   [facility]  [address], [place] [id number], [place], [place]   [place], [place] [phone] Admissions: 8am-3pm M-F  Incentives Substance [place] [address],    [place], [place] [phone]   [facility] [address] [place], [place], [place]   [facility] [address]  [place], [place]   [facility] Programs - Intensive Outpatient [address], [place], [place]   [facility] ([place].,  [place], [place] [phone] or [phone]   [facility]idential Treatment Services (RTS) [address], [place], [place] Accepts Medicaid  [place] [address],  [place] [phone] Substance Abuse/Addiction Treatment   [facility] Organization         Address  Phone  Notes  [facility]  [phone]   Domenic Schwab, PhD [address] Arlis Port[place], [place]   [phone] or [phone]   [place], [place] [phone]   [facility] [address],  [place], [place] [phone] Insurance/Medicaid/sponsorship through [facility] and Families [address], [address]                                    [place], [place] [phone] [place] [address], [place] [phone]    Dr. Adele Schilder  [phone]   [facility] Dept. 1) [address] [place] 2) [place] 3)  [place] [address], [place] [phone]  [phone]   [place] [phone] or [phone] (After Hours)

## 2015-08-05 ENCOUNTER — Emergency Department (HOSPITAL_COMMUNITY): Payer: Medicare Other

## 2015-08-05 ENCOUNTER — Emergency Department (HOSPITAL_COMMUNITY)
Admission: EM | Admit: 2015-08-05 | Discharge: 2015-08-05 | Disposition: A | Discharge status: Home / Self Care | Payer: Medicare Other | Attending: Emergency Medicine | Admitting: Emergency Medicine

## 2015-08-05 ENCOUNTER — Encounter (HOSPITAL_COMMUNITY): Payer: Self-pay | Admitting: Emergency Medicine

## 2015-08-05 DIAGNOSIS — Z760 Encounter for issue of repeat prescription: Secondary | ICD-10-CM | POA: Insufficient documentation

## 2015-08-05 DIAGNOSIS — M179 Osteoarthritis of knee, unspecified: Secondary | ICD-10-CM | POA: Diagnosis not present

## 2015-08-05 DIAGNOSIS — Z79899 Other long term (current) drug therapy: Secondary | ICD-10-CM | POA: Diagnosis not present

## 2015-08-05 DIAGNOSIS — E1165 Type 2 diabetes mellitus with hyperglycemia: Secondary | ICD-10-CM | POA: Insufficient documentation

## 2015-08-05 DIAGNOSIS — Z8669 Personal history of other diseases of the nervous system and sense organs: Secondary | ICD-10-CM | POA: Insufficient documentation

## 2015-08-05 DIAGNOSIS — H409 Unspecified glaucoma: Secondary | ICD-10-CM | POA: Insufficient documentation

## 2015-08-05 DIAGNOSIS — E038 Other specified hypothyroidism: Secondary | ICD-10-CM | POA: Insufficient documentation

## 2015-08-05 DIAGNOSIS — R739 Hyperglycemia, unspecified: Secondary | ICD-10-CM

## 2015-08-05 DIAGNOSIS — M1711 Unilateral primary osteoarthritis, right knee: Secondary | ICD-10-CM | POA: Diagnosis not present

## 2015-08-05 DIAGNOSIS — I10 Essential (primary) hypertension: Secondary | ICD-10-CM | POA: Diagnosis not present

## 2015-08-05 DIAGNOSIS — M25561 Pain in right knee: Secondary | ICD-10-CM | POA: Diagnosis present

## 2015-08-05 LAB — CBG MONITORING, ED: GLUCOSE-CAPILLARY: 352 mg/dL — AB (ref 65–99)

## 2015-08-05 LAB — I-STAT CHEM 8, ED
BUN: 13 mg/dL (ref 6–20)
CREATININE: 0.9 mg/dL (ref 0.44–1.00)
Calcium, Ion: 1.09 mmol/L — ABNORMAL LOW (ref 1.12–1.23)
Chloride: 98 mmol/L — ABNORMAL LOW (ref 101–111)
Glucose, Bld: 421 mg/dL — ABNORMAL HIGH (ref 65–99)
HEMATOCRIT: 43 % (ref 36.0–46.0)
Hemoglobin: 14.6 g/dL (ref 12.0–15.0)
POTASSIUM: 4.2 mmol/L (ref 3.5–5.1)
SODIUM: 139 mmol/L (ref 135–145)
TCO2: 29 mmol/L (ref 0–100)

## 2015-08-05 LAB — TSH: TSH: 84.024 u[IU]/mL — ABNORMAL HIGH (ref 0.350–4.500)

## 2015-08-05 MED ORDER — LEVOTHYROXINE SODIUM 150 MCG PO TABS: 150.0000 ug | ORAL_TABLET | Freq: Every day | ORAL | Status: DC

## 2015-08-05 MED ORDER — ONDANSETRON HCL 4 MG/2ML IJ SOLN
4.0000 mg | Freq: Once | INTRAMUSCULAR | Status: AC
  Administered 2015-08-05: 4 mg via INTRAVENOUS
  Filled 2015-08-05: qty 2

## 2015-08-05 MED ORDER — SODIUM CHLORIDE 0.9 % IV BOLUS (SEPSIS)
1000.0000 mL | Freq: Once | INTRAVENOUS | Status: AC
  Administered 2015-08-05: 1000 mL via INTRAVENOUS

## 2015-08-05 MED ORDER — METOPROLOL TARTRATE 50 MG PO TABS: 50.0000 mg | ORAL_TABLET | Freq: Two times a day (BID) | ORAL | Status: DC

## 2015-08-05 NOTE — ED Notes (Signed)
PA at bedside.

## 2015-08-05 NOTE — ED Provider Notes (Signed)
CSN: [id number]     Arrival date & time 08/05/15  0845 History   First MD Initiated Contact with Patient 08/05/15 [phone]     Chief Complaint  Patient presents with  . Knee Pain    right     (Consider location/radiation/quality/duration/timing/severity/associated sxs/prior Treatment) HPI Comments: Pt comes in with c/o right knee pain that has worsened in the last week. She feel getting off the bus in December and the pain has been worsening. Denies numbness or weakness. She states that she is also out of her blood pressure and thyroid medication for the last 2 weeks.   The history is provided by the patient.    Past Medical History  Diagnosis Date  . Hypertension   . Glaucoma   . Thyroid disease   . Diabetes mellitus   . Morbid obesity   . Obstructive sleep apnea    Past Surgical History  Procedure Laterality Date  . Abdominal hysterectomy    . Thyroid surgery     Family History  Problem Relation Age of Onset  . Diabetes Mother   . Hypertension Mother   . Cancer Mother     pancreas  . Hypertension Sister    Social History  Substance Use Topics  . Smoking status: Never Smoker   . Smokeless tobacco: Never Used  . Alcohol Use: No   OB History    No data available     Review of Systems  All other systems reviewed and are negative.     Allergies  Aspirin  Home Medications   Prior to Admission medications   Medication Sig Start Date End Date Taking? Authorizing Provider  acetaminophen (TYLENOL) 325 MG tablet Take 650 mg by mouth every 4 (four) hours as needed for mild pain.    Historical Provider, MD  cetirizine-pseudoephedrine (ZYRTEC-D) 5-120 MG per tablet Take 1 tablet by mouth daily. 08/21/14   Fredia Sorrow, MD  latanoprost (XALATAN) 0.005 % ophthalmic solution Place 1 drop into both eyes 2 (two) times daily.    Historical Provider, MD  levothyroxine (SYNTHROID) 150 MCG tablet Take 1 tablet (150 mcg total) by mouth daily before breakfast. 04/20/14   Liam Graham, PA-C  levothyroxine (SYNTHROID, LEVOTHROID) 150 MCG tablet Take 1 tablet (150 mcg total) by mouth daily before breakfast. 08/21/14   Fredia Sorrow, MD  metFORMIN (GLUCOPHAGE) 1000 MG tablet Take 1 tablet (1,000 mg total) by mouth 2 (two) times daily with a meal. 03/05/13   Delfina Redwood, MD  metoprolol (LOPRESSOR) 50 MG tablet Take 1 tablet (50 mg total) by mouth 2 (two) times daily. 04/20/14   Liam Graham, PA-C  metoprolol (LOPRESSOR) 50 MG tablet Take 1 tablet (50 mg total) by mouth 2 (two) times daily. 08/21/14   Fredia Sorrow, MD  timolol (BETIMOL) 0.5 % ophthalmic solution Place 1 drop into both eyes 2 (two) times daily.    Historical Provider, MD   BP 180/106 mmHg  Pulse 73  Temp(Src) 97.9 F (36.6 C) (Oral)  Resp 14  Ht 5\' 7"  (1.702 m)  Wt 350 lb (158.759 kg)  BMI 54.80 kg/m2  SpO2 96% Physical Exam  Constitutional: She is oriented to person, place, and time. She appears well-developed and well-nourished.  Cardiovascular: Normal rate and regular rhythm.   Pulmonary/Chest: Effort normal and breath sounds normal.  Musculoskeletal: Normal range of motion.  No gross deformity or swelling noted to the right knee. Pt has full rom. No redness noted. Pulses intact  Neurological: She  is alert and oriented to person, place, and time.  Skin: Skin is dry.  Psychiatric: She has a normal mood and affect.  Nursing note and vitals reviewed.   ED Course  Procedures (including critical care time) Labs Review Labs Reviewed  TSH - Abnormal; Notable for the following:    TSH 84.024 (*)    All other components within normal limits  I-STAT CHEM 8, ED - Abnormal; Notable for the following:    Chloride 98 (*)    Glucose, Bld 421 (*)    Calcium, Ion 1.09 (*)    All other components within normal limits  CBG MONITORING, ED - Abnormal; Notable for the following:    Glucose-Capillary 352 (*)    All other components within normal limits    Imaging Review Dg Knee Complete 4  Views Right  08/05/2015   CLINICAL DATA:  Chronic right knee pain, increasing over the last week.  EXAM: RIGHT KNEE - COMPLETE 4+ VIEW  COMPARISON:  None.  FINDINGS: There is no evidence of fracture, subluxation or dislocation.  Degenerative changes are noted, moderate in the patellofemoral compartment and very mild mild in the medial compartment.  No focal bony lesions or joint effusion identified.  IMPRESSION: No evidence of acute abnormality.  Degenerative changes, moderate in the patellofemoral compartment and very mild in the medial compartment.   Electronically Signed   By: Margarette Canada M.D.   On: 08/05/2015 10:03   I have personally reviewed and evaluated these images and lab results as part of my medical decision-making.   EKG Interpretation None      MDM   Final diagnoses:  Essential hypertension  Medication refill  Osteoarthritis of right knee, unspecified osteoarthritis type  Hyperglycemia  Other specified hypothyroidism    Discussed with pt the importance of medication compliance. Given resources. Refilled bp and thyroid medication. Pt states that she has her metformin. Discussed the elevated thyroid although with her not taking unsure of accuracy    Glendell Docker, NP 08/05/15 1312  Davonna Belling, MD 08/05/15 [phone]

## 2015-08-05 NOTE — Discharge Instructions (Signed)
You need to have your thyroid checked again in 1 month. You need to try and find a primary care doctor as you need to be having labs checked more frequently. You can take over the counter arthritis medications for your knee Hypertension Hypertension is another name for high blood pressure. High blood pressure forces your heart to work harder to pump blood. A blood pressure reading has two numbers, which includes a higher number over a lower number (example: 110/72). HOME CARE   Have your blood pressure rechecked by your doctor.  Only take medicine as told by your doctor. Follow the directions carefully. The medicine does not work as well if you skip doses. Skipping doses also puts you at risk for problems.  Do not smoke.  Monitor your blood pressure at home as told by your doctor. GET HELP IF:  You think you are having a reaction to the medicine you are taking.  You have repeat headaches or feel dizzy.  You have puffiness (swelling) in your ankles.  You have trouble with your vision. GET HELP RIGHT AWAY IF:   You get a very bad headache and are confused.  You feel weak, numb, or faint.  You get chest or belly (abdominal) pain.  You throw up (vomit).  You cannot breathe very well. MAKE SURE YOU:   Understand these instructions.  Will watch your condition.  Will get help right away if you are not doing well or get worse. Document Released: 05/15/2008 Document Revised: 12/02/2013 Document Reviewed: 09/19/2013 [facility] Patient Information 2015 [place], [place]. This information is not intended to replace advice given to you by your health care provider. Make sure you discuss any questions you have with your health care provider.  Hypothyroidism The thyroid is a large gland located in the lower front of your neck. The thyroid gland helps control metabolism. Metabolism is how your body handles food. It controls metabolism with the hormone thyroxine. When this gland is underactive  (hypothyroid), it produces too little hormone.  CAUSES These include:   Absence or destruction of thyroid tissue.  Goiter due to iodine deficiency.  Goiter due to medications.  Congenital defects (since birth).  Problems with the pituitary. This causes a lack of TSH (thyroid stimulating hormone). This hormone tells the thyroid to turn out more hormone. SYMPTOMS  Lethargy (feeling as though you have no energy)  Cold intolerance  Weight gain (in spite of normal food intake)  Dry skin  Coarse hair  Menstrual irregularity (if severe, may lead to infertility)  Slowing of thought processes Cardiac problems are also caused by insufficient amounts of thyroid hormone. Hypothyroidism in the newborn is cretinism, and is an extreme form. It is important that this form be treated adequately and immediately or it will lead rapidly to retarded physical and mental development. DIAGNOSIS  To prove hypothyroidism, your caregiver may do blood tests and ultrasound tests. Sometimes the signs are hidden. It may be necessary for your caregiver to watch this illness with blood tests either before or after diagnosis and treatment. TREATMENT  Low levels of thyroid hormone are increased by using synthetic thyroid hormone. This is a safe, effective treatment. It usually takes about four weeks to gain the full effects of the medication. After you have the full effect of the medication, it will generally take another four weeks for problems to leave. Your caregiver may start you on low doses. If you have had heart problems the dose may be gradually increased. It is generally not  an emergency to get rapidly to normal. [place]   Take your medications as your caregiver suggests. Let your caregiver know of any medications you are taking or start taking. Your caregiver will help you with dosage schedules.  As your condition improves, your dosage needs may increase. It will be necessary to have  continuing blood tests as suggested by your caregiver.  Report all suspected medication side effects to your caregiver. SEEK MEDICAL CARE IF: Seek medical care if you develop:  Sweating.  Tremulousness (tremors).  Anxiety.  Rapid weight loss.  Heat intolerance.  Emotional swings.  Diarrhea.  Weakness. SEEK IMMEDIATE MEDICAL CARE IF:  You develop chest pain, an irregular heart beat (palpitations), or a rapid heart beat. MAKE SURE YOU:   Understand these instructions.  Will watch your condition.  Will get help right away if you are not doing well or get worse. Document Released: 11/27/2005 Document Revised: 02/19/2012 Document Reviewed: 07/17/2008 [facility] Patient Information 2015 [place], [place]. This information is not intended to replace advice given to you by your health care provider. Make sure you discuss any questions you have with your health care provider.

## 2015-08-05 NOTE — ED Notes (Addendum)
Pt reports falling back in Dec 2015. Pt has been having right knee pain since then but the pain has been progressively getting worse in the last week. Pt also reports being out of her blood pressure and thyroid medications x 2 weeks. Pt is in the process of finding new PCP due to her insurance.
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
## 2015-10-02 ENCOUNTER — Emergency Department (HOSPITAL_COMMUNITY)
Admission: EM | Admit: 2015-10-02 | Discharge: 2015-10-03 | Disposition: A | Discharge status: Home / Self Care | Payer: Medicare Other | Attending: Emergency Medicine | Admitting: Emergency Medicine

## 2015-10-02 ENCOUNTER — Encounter (HOSPITAL_COMMUNITY): Payer: Self-pay | Admitting: *Deleted

## 2015-10-02 ENCOUNTER — Other Ambulatory Visit: Payer: Self-pay

## 2015-10-02 DIAGNOSIS — E1165 Type 2 diabetes mellitus with hyperglycemia: Secondary | ICD-10-CM | POA: Insufficient documentation

## 2015-10-02 DIAGNOSIS — R61 Generalized hyperhidrosis: Secondary | ICD-10-CM | POA: Diagnosis not present

## 2015-10-02 DIAGNOSIS — H409 Unspecified glaucoma: Secondary | ICD-10-CM | POA: Insufficient documentation

## 2015-10-02 DIAGNOSIS — R6883 Chills (without fever): Secondary | ICD-10-CM | POA: Insufficient documentation

## 2015-10-02 DIAGNOSIS — I10 Essential (primary) hypertension: Secondary | ICD-10-CM | POA: Insufficient documentation

## 2015-10-02 DIAGNOSIS — R358 Other polyuria: Secondary | ICD-10-CM | POA: Insufficient documentation

## 2015-10-02 DIAGNOSIS — Z8669 Personal history of other diseases of the nervous system and sense organs: Secondary | ICD-10-CM | POA: Insufficient documentation

## 2015-10-02 DIAGNOSIS — R404 Transient alteration of awareness: Secondary | ICD-10-CM | POA: Diagnosis not present

## 2015-10-02 DIAGNOSIS — R531 Weakness: Secondary | ICD-10-CM | POA: Diagnosis not present

## 2015-10-02 DIAGNOSIS — R110 Nausea: Secondary | ICD-10-CM | POA: Insufficient documentation

## 2015-10-02 DIAGNOSIS — E079 Disorder of thyroid, unspecified: Secondary | ICD-10-CM | POA: Insufficient documentation

## 2015-10-02 DIAGNOSIS — R739 Hyperglycemia, unspecified: Secondary | ICD-10-CM

## 2015-10-02 DIAGNOSIS — Z79899 Other long term (current) drug therapy: Secondary | ICD-10-CM | POA: Diagnosis not present

## 2015-10-02 LAB — URINALYSIS, ROUTINE W REFLEX MICROSCOPIC
Bilirubin Urine: NEGATIVE
GLUCOSE, UA: 100 mg/dL — AB
HGB URINE DIPSTICK: NEGATIVE
Ketones, ur: NEGATIVE mg/dL
LEUKOCYTES UA: NEGATIVE
Nitrite: NEGATIVE
PROTEIN: NEGATIVE mg/dL
SPECIFIC GRAVITY, URINE: 1.015 (ref 1.005–1.030)
Urobilinogen, UA: 0.2 mg/dL (ref 0.0–1.0)
pH: 6.5 (ref 5.0–8.0)

## 2015-10-02 LAB — CBG MONITORING, ED: Glucose-Capillary: 253 mg/dL — ABNORMAL HIGH (ref 65–99)

## 2015-10-02 NOTE — ED Notes (Signed)
Pt c/o elevated blood sugar, elevated blood pressure, pt state that she was in [employer] earlier today when she started feeling bad, blood sugar tonight was 343 with ems, pt reports that she has taken her last doses of all her medications today,

## 2015-10-02 NOTE — ED Provider Notes (Signed)
TIME SEEN: 11:05 PM   CHIEF COMPLAINT: Hyperglycemia   HPI:  Maureen Jackson is a 59 y.o. female with a PMH of HTN and an insulin-dependent DM who presents to the Emergency Department complaining of hyperglycemia, nausea, diaphoresis, and chills while at [employer] 4:30 pm this afternnoon. She reports associated polyuria. She denies any fever, cough, vomiting, pain or discomfort, CP, or SOB. She reports that she is currently on Metformin and checks her blood sugar daily. She adds that it usually runs around 200s. She states that she took her last medication today and will not be able to see Dr. Sherrie Sport in [place] until the first week of November and will need refills until then. Patient reports she feels better now is back to her baseline. Blood glucose in the ED is 253.  No PCP  ROS: See HPI Constitutional: no fever  Eyes: no drainage  ENT: no runny nose   Cardiovascular:  no chest pain  Resp: no SOB  GI: no vomiting GU: no dysuria Integumentary: no rash  Allergy: no hives  Musculoskeletal: no leg swelling  Neurological: no slurred speech ROS otherwise negative  PAST MEDICAL HISTORY/PAST SURGICAL HISTORY:  Past Medical History  Diagnosis Date  . Hypertension   . Glaucoma   . Thyroid disease   . Diabetes mellitus   . Morbid obesity ([place])   . Obstructive sleep apnea     MEDICATIONS:  Prior to Admission medications   Medication Sig Start Date End Date Taking? Authorizing Provider  levothyroxine (SYNTHROID, LEVOTHROID) 150 MCG tablet Take 1 tablet (150 mcg total) by mouth daily before breakfast. 08/05/15  Yes Glendell Docker, NP  metFORMIN (GLUCOPHAGE) 1000 MG tablet Take 1 tablet (1,000 mg total) by mouth 2 (two) times daily with a meal. 03/05/13  Yes Delfina Redwood, MD  metoprolol (LOPRESSOR) 50 MG tablet Take 1 tablet (50 mg total) by mouth 2 (two) times daily. 08/05/15  Yes Glendell Docker, NP  timolol (BETIMOL) 0.5 % ophthalmic solution Place 1 drop into both eyes 2 (two) times  daily.   Yes Historical Provider, MD  levothyroxine (SYNTHROID) 150 MCG tablet Take 1 tablet (150 mcg total) by mouth daily before breakfast. 04/20/14   Liam Graham, PA-C  metoprolol (LOPRESSOR) 50 MG tablet Take 1 tablet (50 mg total) by mouth 2 (two) times daily. 04/20/14   Liam Graham, PA-C    ALLERGIES:  Allergies  Allergen Reactions  . Aspirin Nausea Only    SOCIAL HISTORY:  Social History  Substance Use Topics  . Smoking status: Never Smoker   . Smokeless tobacco: Never Used  . Alcohol Use: No    FAMILY HISTORY: Family History  Problem Relation Age of Onset  . Diabetes Mother   . Hypertension Mother   . Cancer Mother     pancreas  . Hypertension Sister     EXAM: BP 185/96 mmHg  Pulse 62  Temp(Src) 98.2 F (36.8 C) (Oral)  Resp 20  Ht 5\' 6"  (1.676 m)  Wt 350 lb (158.759 kg)  BMI 56.52 kg/m2  SpO2 96% CONSTITUTIONAL: Alert and oriented and responds appropriately to questions. Well-appearing; well-nourished, obese, in no distress HEAD: Normocephalic EYES: Conjunctivae clear, PERRL ENT: normal nose; no rhinorrhea; moist mucous membranes; pharynx without lesions noted NECK: Supple, no meningismus, no LAD  CARD: RRR; S1 and S2 appreciated; no murmurs, no clicks, no rubs, no gallops RESP: Normal chest excursion without splinting or tachypnea; breath sounds clear and equal bilaterally; no wheezes, no rhonchi, no  rales, no hypoxia or respiratory distress, speaking full sentences ABD/GI: Normal bowel sounds; non-distended; soft, non-tender, no rebound, no guarding, no peritoneal signs BACK:  The back appears normal and is non-tender to palpation, there is no CVA tenderness EXT: Normal ROM in all joints; non-tender to palpation; no edema; normal capillary refill; no cyanosis, no calf tenderness or swelling    SKIN: Normal color for age and race; warm NEURO: Moves all extremities equally, sensation to light touch intact diffusely, cranial nerves II through XII  intact PSYCH: The patient's mood and manner are appropriate. Grooming and personal hygiene are appropriate.  MEDICAL DECISION MAKING: Patient here with episode of nausea, diaphoresis at 4:30 today. He states later that afternoon she had chills but no fever. Is now back to baseline with no complaints. Denies any episodes of chest pain or shortness of breath today. Blood glucose was elevated with EMS and 300s but is now 253. Discussed with patient that she may be coming down with a viral illness. Doubt ACS given no chest pain or shortness of breath but will check troponin, EKG. We'll check urine to see if there is a UTI even her complaints of urinary frequency. We'll check basic labs to evaluate for other causes such as anemia, electrolyte abnormality.  ED PROGRESS: Patient's labs are unremarkable including negative troponin. Given episode occurred more than 6 hours ago I do not feel she needs a second troponin. She is still asymptomatic. Urine shows no sign of infection. I feel she is safe to be discharged home. She is medically stable. Discussed return precautions. She has follow-up scheduled with her PCP first week of November. She verbalizes understanding and is comfortable with this plan.      Date: 10/02/2015 23:28  Rate: 63  Rhythm: normal sinus rhythm  QRS Axis: normal  Intervals: normal  ST/T Wave abnormalities: normal  Conduction Disutrbances: none  Narrative Interpretation: Q waves in anterior leads which are old, no significant changes compared to prior EKGs      I personally performed the services described in this documentation, which was scribed in my presence. The recorded information has been reviewed and is accurate.   [place], DO 10/03/15 0430

## 2015-10-03 LAB — TROPONIN I: Troponin I: <0.03 ng/mL (ref <0.031)

## 2015-10-03 LAB — COMPREHENSIVE METABOLIC PANEL
ALBUMIN: 3.4 g/dL — AB (ref 3.5–5.0)
ALT: 35 U/L (ref 14–54)
ANION GAP: 10 (ref 5–15)
AST: 38 U/L (ref 15–41)
Alkaline Phosphatase: 189 U/L — ABNORMAL HIGH (ref 38–126)
BUN: 13 mg/dL (ref 6–20)
CO2: 28 mmol/L (ref 22–32)
Calcium: 9.1 mg/dL (ref 8.9–10.3)
Chloride: 103 mmol/L (ref 101–111)
Creatinine, Ser: 0.91 mg/dL (ref 0.44–1.00)
GFR calc Af Amer: >60 mL/min (ref >60)
GFR calc non Af Amer: >60 mL/min (ref >60)
GLUCOSE: 263 mg/dL — AB (ref 65–99)
POTASSIUM: 3.6 mmol/L (ref 3.5–5.1)
SODIUM: 141 mmol/L (ref 135–145)
TOTAL PROTEIN: 7.7 g/dL (ref 6.5–8.1)
Total Bilirubin: 0.9 mg/dL (ref 0.3–1.2)

## 2015-10-03 LAB — CBC WITH DIFFERENTIAL/PLATELET
BASOS PCT: 0 %
Basophils Absolute: 0 10*3/uL (ref 0.0–0.1)
EOS ABS: 0.7 10*3/uL (ref 0.0–0.7)
Eosinophils Relative: 9 %
HCT: 37.8 % (ref 36.0–46.0)
Hemoglobin: 12.7 g/dL (ref 12.0–15.0)
LYMPHS PCT: 42 %
Lymphs Abs: 3.3 10*3/uL (ref 0.7–4.0)
MCH: 29 pg (ref 26.0–34.0)
MCHC: 33.6 g/dL (ref 30.0–36.0)
MCV: 86.3 fL (ref 78.0–100.0)
MONO ABS: 0.3 10*3/uL (ref 0.1–1.0)
MONOS PCT: 4 %
NEUTROS ABS: 3.5 10*3/uL (ref 1.7–7.7)
Neutrophils Relative %: 45 %
PLATELETS: 224 10*3/uL (ref 150–400)
RBC: 4.38 MIL/uL (ref 3.87–5.11)
RDW: 13.1 % (ref 11.5–15.5)
WBC: 7.7 10*3/uL (ref 4.0–10.5)

## 2015-10-03 MED ORDER — METFORMIN HCL 1000 MG PO TABS: 1000.0000 mg | ORAL_TABLET | Freq: Two times a day (BID) | ORAL | Status: DC

## 2015-10-03 MED ORDER — LEVOTHYROXINE SODIUM 150 MCG PO TABS: 150.0000 ug | ORAL_TABLET | Freq: Every day | ORAL | Status: DC

## 2015-10-03 MED ORDER — TIMOLOL HEMIHYDRATE 0.5 % OP SOLN: 1.0000 [drp] | Freq: Two times a day (BID) | OPHTHALMIC | Status: DC

## 2015-10-03 MED ORDER — METOPROLOL TARTRATE 50 MG PO TABS
50.0000 mg | ORAL_TABLET | Freq: Two times a day (BID) | ORAL | Status: DC
Start: 2015-10-03 — End: 2017-09-05

## 2015-10-03 NOTE — Discharge Instructions (Signed)
Hyperglycemia °Hyperglycemia occurs when the glucose (sugar) in your blood is too high. Hyperglycemia can happen for many reasons, but it most often happens to people who do not know they have diabetes or are not managing their diabetes properly.  °CAUSES  °Whether you have diabetes or not, there are other causes of hyperglycemia. Hyperglycemia can occur when you have diabetes, but it can also occur in other situations that you might not be as aware of, such as: °Diabetes °· If you have diabetes and are having problems controlling your blood glucose, hyperglycemia could occur because of some of the following reasons: °¨ Not following your meal plan. °¨ Not taking your diabetes medications or not taking it properly. °¨ Exercising less or doing less activity than you normally do. °¨ Being sick. °Pre-diabetes °· This cannot be ignored. Before people develop Type 2 diabetes, they almost always have "pre-diabetes." This is when your blood glucose levels are higher than normal, but not yet high enough to be diagnosed as diabetes. Research has shown that some long-term damage to the body, especially the heart and circulatory system, may already be occurring during pre-diabetes. If you take action to manage your blood glucose when you have pre-diabetes, you may delay or prevent Type 2 diabetes from developing. °Stress °· If you have diabetes, you may be "diet" controlled or on oral medications or insulin to control your diabetes. However, you may find that your blood glucose is higher than usual in the hospital whether you have diabetes or not. This is often referred to as "stress hyperglycemia." Stress can elevate your blood glucose. This happens because of hormones put out by the body during times of stress. If stress has been the cause of your high blood glucose, it can be followed regularly by your caregiver. That way he/she can make sure your hyperglycemia does not continue to get worse or progress to  diabetes. °Steroids °· Steroids are medications that act on the infection fighting system (immune system) to block inflammation or infection. One side effect can be a rise in blood glucose. Most people can produce enough extra insulin to allow for this rise, but for those who cannot, steroids make blood glucose levels go even higher. It is not unusual for steroid treatments to "uncover" diabetes that is developing. It is not always possible to determine if the hyperglycemia will go away after the steroids are stopped. A special blood test called an A1c is sometimes done to determine if your blood glucose was elevated before the steroids were started. °SYMPTOMS °· Thirsty. °· Frequent urination. °· Dry mouth. °· Blurred vision. °· Tired or fatigue. °· Weakness. °· Sleepy. °· Tingling in feet or leg. °DIAGNOSIS  °Diagnosis is made by monitoring blood glucose in one or all of the following ways: °· A1c test. This is a chemical found in your blood. °· Fingerstick blood glucose monitoring. °· Laboratory results. °TREATMENT  °First, knowing the cause of the hyperglycemia is important before the hyperglycemia can be treated. Treatment may include, but is not be limited to: °· Education. °· Change or adjustment in medications. °· Change or adjustment in meal plan. °· Treatment for an illness, infection, etc. °· More frequent blood glucose monitoring. °· Change in exercise plan. °· Decreasing or stopping steroids. °· Lifestyle changes. °HOME CARE INSTRUCTIONS  °· Test your blood glucose as directed. °· Exercise regularly. Your caregiver will give you instructions about exercise. Pre-diabetes or diabetes which comes on with stress is helped by exercising. °· Eat wholesome,   balanced meals. Eat often and at regular, fixed times. Your caregiver or nutritionist will give you a meal plan to guide your sugar intake. °· Being at an ideal weight is important. If needed, losing as little as 10 to 15 pounds may help improve blood  glucose levels. °SEEK MEDICAL CARE IF:  °· You have questions about medicine, activity, or diet. °· You continue to have symptoms (problems such as increased thirst, urination, or weight gain). °SEEK IMMEDIATE MEDICAL CARE IF:  °· You are vomiting or have diarrhea. °· Your breath smells fruity. °· You are breathing faster or slower. °· You are very sleepy or incoherent. °· You have numbness, tingling, or pain in your feet or hands. °· You have chest pain. °· Your symptoms get worse even though you have been following your caregiver's orders. °· If you have any other questions or concerns. °  °This information is not intended to replace advice given to you by your health care provider. Make sure you discuss any questions you have with your health care provider. °  °Document Released: 05/23/2001 Document Revised: 02/19/2012 Document Reviewed: 08/03/2015 °Elsevier Interactive Patient Education ©2016 Elsevier Inc. ° °

## 2015-10-03 NOTE — ED Notes (Signed)
Pt states understanding of care given and follow up instructions.  Pt ambulated from ED on cell phone calling family for a ride

## 2015-10-03 NOTE — ED Notes (Signed)
Patient ambulated to restroom.

## 2015-10-05 DIAGNOSIS — M25561 Pain in right knee: Secondary | ICD-10-CM | POA: Diagnosis not present

## 2015-10-05 DIAGNOSIS — E1165 Type 2 diabetes mellitus with hyperglycemia: Secondary | ICD-10-CM | POA: Diagnosis not present

## 2015-10-05 DIAGNOSIS — I10 Essential (primary) hypertension: Secondary | ICD-10-CM | POA: Diagnosis not present

## 2015-10-05 DIAGNOSIS — E038 Other specified hypothyroidism: Secondary | ICD-10-CM | POA: Diagnosis not present

## 2016-02-14 ENCOUNTER — Emergency Department (INDEPENDENT_AMBULATORY_CARE_PROVIDER_SITE_OTHER)
Admission: EM | Admit: 2016-02-14 | Discharge: 2016-02-14 | Disposition: A | Discharge status: Home / Self Care | Payer: Medicare Other | Source: Home / Self Care | Attending: Family Medicine | Admitting: Family Medicine

## 2016-02-14 ENCOUNTER — Encounter (HOSPITAL_COMMUNITY): Payer: Self-pay | Admitting: Emergency Medicine

## 2016-02-14 ENCOUNTER — Emergency Department (INDEPENDENT_AMBULATORY_CARE_PROVIDER_SITE_OTHER): Payer: Medicare Other

## 2016-02-14 DIAGNOSIS — R05 Cough: Secondary | ICD-10-CM | POA: Diagnosis not present

## 2016-02-14 DIAGNOSIS — M25561 Pain in right knee: Secondary | ICD-10-CM

## 2016-02-14 DIAGNOSIS — R059 Cough, unspecified: Secondary | ICD-10-CM

## 2016-02-14 MED ORDER — AMOXICILLIN 500 MG PO CAPS: 500.0000 mg | ORAL_CAPSULE | Freq: Three times a day (TID) | ORAL | Status: DC
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
## 2016-02-14 NOTE — Discharge Instructions (Signed)
Cough, Adult A cough helps to clear your throat and lungs. A cough may last only 2-3 weeks (acute), or it may last longer than 8 weeks (chronic). Many different things can cause a cough. A cough may be a sign of an illness or another medical condition. HOME CARE  Pay attention to any changes in your cough.  Take medicines only as told by your doctor.  If you were prescribed an antibiotic medicine, take it as told by your doctor. Do not stop taking it even if you start to feel better.  Talk with your doctor before you try using a cough medicine.  Drink enough fluid to keep your pee (urine) clear or pale yellow.  If the air is dry, use a cold steam vaporizer or humidifier in your home.  Stay away from things that make you cough at work or at home.  If your cough is worse at night, try using extra pillows to raise your head up higher while you sleep.  Do not smoke, and try not to be around smoke. If you need help quitting, ask your doctor.  Do not have caffeine.  Do not drink alcohol.  Rest as needed. GET HELP IF:  You have new problems (symptoms).  You cough up yellow fluid (pus).  Your cough does not get better after 2-3 weeks, or your cough gets worse.  Medicine does not help your cough and you are not sleeping well.  You have pain that gets worse or pain that is not helped with medicine.  You have a fever.  You are losing weight and you do not know why.  You have night sweats. GET HELP RIGHT AWAY IF:  You cough up blood.  You have trouble breathing.  Your heartbeat is very fast.   This information is not intended to replace advice given to you by your health care provider. Make sure you discuss any questions you have with your health care provider.   Document Released: 08/10/2011 Document Revised: 08/18/2015 Document Reviewed: 02/03/2015 Elsevier Interactive Patient Education 2016 [place] therapy can help ease sore, stiff, injured,  and tight muscles and joints. Heat relaxes your muscles, which may help ease your pain. Heat therapy should only be used on old, pre-existing, or long-lasting (chronic) injuries. Do not use heat therapy unless told by your doctor. HOW TO USE HEAT THERAPY There are several different kinds of heat therapy, including:  Moist heat pack.  Warm water bath.  Hot water bottle.  Electric heating pad.  Heated gel pack.  Heated wrap.  Electric heating pad. GENERAL HEAT THERAPY RECOMMENDATIONS   Do not sleep while using heat therapy. Only use heat therapy while you are awake.  Your skin may turn pink while using heat therapy. Do not use heat therapy if your skin turns red.  Do not use heat therapy if you have new pain.  High heat or long exposure to heat can cause burns. Be careful when using heat therapy to avoid burning your skin.  Do not use heat therapy on areas of your skin that are already irritated, such as with a rash or sunburn. GET HELP IF:   You have blisters, redness, swelling (puffiness), or numbness.  You have new pain.  Your pain is worse. MAKE SURE YOU:  Understand these instructions.  Will watch your condition.  Will get help right away if you are not doing well or get worse.   This information is not intended to replace advice  given to you by your health care provider. Make sure you discuss any questions you have with your health care provider.   Document Released: 02/19/2012 Document Revised: 12/18/2014 Document Reviewed: 01/20/2014 Elsevier Interactive Patient Education [employer].

## 2016-04-06 DIAGNOSIS — M25562 Pain in left knee: Secondary | ICD-10-CM | POA: Diagnosis not present

## 2016-04-06 DIAGNOSIS — I10 Essential (primary) hypertension: Secondary | ICD-10-CM | POA: Diagnosis not present

## 2016-04-06 DIAGNOSIS — E1165 Type 2 diabetes mellitus with hyperglycemia: Secondary | ICD-10-CM | POA: Diagnosis not present

## 2016-04-06 DIAGNOSIS — M25561 Pain in right knee: Secondary | ICD-10-CM | POA: Diagnosis not present

## 2016-04-06 DIAGNOSIS — E038 Other specified hypothyroidism: Secondary | ICD-10-CM | POA: Diagnosis not present

## 2017-05-11 DIAGNOSIS — R42 Dizziness and giddiness: Secondary | ICD-10-CM | POA: Diagnosis not present

## 2017-05-11 DIAGNOSIS — Z6841 Body Mass Index (BMI) 40.0 and over, adult: Secondary | ICD-10-CM | POA: Diagnosis not present

## 2017-05-11 DIAGNOSIS — E119 Type 2 diabetes mellitus without complications: Secondary | ICD-10-CM | POA: Diagnosis not present

## 2017-05-11 DIAGNOSIS — E784 Other hyperlipidemia: Secondary | ICD-10-CM | POA: Diagnosis not present

## 2017-05-11 DIAGNOSIS — J9811 Atelectasis: Secondary | ICD-10-CM | POA: Diagnosis not present

## 2017-05-11 DIAGNOSIS — E039 Hypothyroidism, unspecified: Secondary | ICD-10-CM | POA: Diagnosis not present

## 2017-05-11 DIAGNOSIS — E038 Other specified hypothyroidism: Secondary | ICD-10-CM | POA: Diagnosis not present

## 2017-05-11 DIAGNOSIS — R531 Weakness: Secondary | ICD-10-CM | POA: Diagnosis not present

## 2017-05-11 DIAGNOSIS — M170 Bilateral primary osteoarthritis of knee: Secondary | ICD-10-CM | POA: Diagnosis not present

## 2017-05-11 DIAGNOSIS — E785 Hyperlipidemia, unspecified: Secondary | ICD-10-CM | POA: Diagnosis not present

## 2017-05-11 DIAGNOSIS — I160 Hypertensive urgency: Secondary | ICD-10-CM | POA: Diagnosis not present

## 2017-05-11 DIAGNOSIS — R51 Headache: Secondary | ICD-10-CM | POA: Diagnosis not present

## 2017-05-12 DIAGNOSIS — M170 Bilateral primary osteoarthritis of knee: Secondary | ICD-10-CM | POA: Diagnosis not present

## 2017-05-12 DIAGNOSIS — E119 Type 2 diabetes mellitus without complications: Secondary | ICD-10-CM | POA: Diagnosis not present

## 2017-05-12 DIAGNOSIS — E784 Other hyperlipidemia: Secondary | ICD-10-CM | POA: Diagnosis not present

## 2017-05-12 DIAGNOSIS — E038 Other specified hypothyroidism: Secondary | ICD-10-CM | POA: Diagnosis not present

## 2017-05-12 DIAGNOSIS — I160 Hypertensive urgency: Secondary | ICD-10-CM | POA: Diagnosis not present

## 2017-05-13 DIAGNOSIS — E119 Type 2 diabetes mellitus without complications: Secondary | ICD-10-CM | POA: Diagnosis not present

## 2017-05-13 DIAGNOSIS — E784 Other hyperlipidemia: Secondary | ICD-10-CM | POA: Diagnosis not present

## 2017-05-13 DIAGNOSIS — E038 Other specified hypothyroidism: Secondary | ICD-10-CM | POA: Diagnosis not present

## 2017-05-13 DIAGNOSIS — M170 Bilateral primary osteoarthritis of knee: Secondary | ICD-10-CM | POA: Diagnosis not present

## 2017-05-13 DIAGNOSIS — I160 Hypertensive urgency: Secondary | ICD-10-CM | POA: Diagnosis not present

## 2017-05-16 DIAGNOSIS — R69 Illness, unspecified: Secondary | ICD-10-CM | POA: Diagnosis not present

## 2017-05-17 DIAGNOSIS — R69 Illness, unspecified: Secondary | ICD-10-CM | POA: Diagnosis not present

## 2017-05-22 DIAGNOSIS — E1165 Type 2 diabetes mellitus with hyperglycemia: Secondary | ICD-10-CM | POA: Diagnosis not present

## 2017-05-22 DIAGNOSIS — E038 Other specified hypothyroidism: Secondary | ICD-10-CM | POA: Diagnosis not present

## 2017-05-22 DIAGNOSIS — H35033 Hypertensive retinopathy, bilateral: Secondary | ICD-10-CM | POA: Diagnosis not present

## 2017-09-05 ENCOUNTER — Encounter (HOSPITAL_COMMUNITY): Payer: Self-pay | Admitting: Emergency Medicine

## 2017-09-05 ENCOUNTER — Inpatient Hospital Stay (HOSPITAL_COMMUNITY)
Admission: EM | Admit: 2017-09-05 | Discharge: 2017-09-14 | DRG: 247 | Disposition: A | Discharge status: Home / Self Care | Payer: Medicare HMO | Attending: Internal Medicine | Admitting: Internal Medicine

## 2017-09-05 ENCOUNTER — Emergency Department (HOSPITAL_COMMUNITY): Payer: Medicare HMO

## 2017-09-05 DIAGNOSIS — I25110 Atherosclerotic heart disease of native coronary artery with unstable angina pectoris: Secondary | ICD-10-CM | POA: Diagnosis not present

## 2017-09-05 DIAGNOSIS — E038 Other specified hypothyroidism: Secondary | ICD-10-CM | POA: Diagnosis not present

## 2017-09-05 DIAGNOSIS — I5041 Acute combined systolic (congestive) and diastolic (congestive) heart failure: Secondary | ICD-10-CM | POA: Diagnosis present

## 2017-09-05 DIAGNOSIS — E119 Type 2 diabetes mellitus without complications: Secondary | ICD-10-CM | POA: Diagnosis present

## 2017-09-05 DIAGNOSIS — E785 Hyperlipidemia, unspecified: Secondary | ICD-10-CM | POA: Diagnosis present

## 2017-09-05 DIAGNOSIS — R7989 Other specified abnormal findings of blood chemistry: Secondary | ICD-10-CM | POA: Diagnosis present

## 2017-09-05 DIAGNOSIS — Z833 Family history of diabetes mellitus: Secondary | ICD-10-CM

## 2017-09-05 DIAGNOSIS — E118 Type 2 diabetes mellitus with unspecified complications: Secondary | ICD-10-CM

## 2017-09-05 DIAGNOSIS — Z9119 Patient's noncompliance with other medical treatment and regimen: Secondary | ICD-10-CM

## 2017-09-05 DIAGNOSIS — J449 Chronic obstructive pulmonary disease, unspecified: Secondary | ICD-10-CM | POA: Diagnosis present

## 2017-09-05 DIAGNOSIS — G4733 Obstructive sleep apnea (adult) (pediatric): Secondary | ICD-10-CM | POA: Diagnosis present

## 2017-09-05 DIAGNOSIS — Z7984 Long term (current) use of oral hypoglycemic drugs: Secondary | ICD-10-CM

## 2017-09-05 DIAGNOSIS — R05 Cough: Secondary | ICD-10-CM | POA: Diagnosis not present

## 2017-09-05 DIAGNOSIS — E1165 Type 2 diabetes mellitus with hyperglycemia: Secondary | ICD-10-CM | POA: Diagnosis present

## 2017-09-05 DIAGNOSIS — I313 Pericardial effusion (noninflammatory): Secondary | ICD-10-CM | POA: Diagnosis present

## 2017-09-05 DIAGNOSIS — R748 Abnormal levels of other serum enzymes: Secondary | ICD-10-CM | POA: Diagnosis not present

## 2017-09-05 DIAGNOSIS — I248 Other forms of acute ischemic heart disease: Secondary | ICD-10-CM | POA: Diagnosis present

## 2017-09-05 DIAGNOSIS — E1159 Type 2 diabetes mellitus with other circulatory complications: Secondary | ICD-10-CM | POA: Diagnosis present

## 2017-09-05 DIAGNOSIS — I3139 Other pericardial effusion (noninflammatory): Secondary | ICD-10-CM | POA: Diagnosis present

## 2017-09-05 DIAGNOSIS — E039 Hypothyroidism, unspecified: Secondary | ICD-10-CM | POA: Diagnosis not present

## 2017-09-05 DIAGNOSIS — Z886 Allergy status to analgesic agent status: Secondary | ICD-10-CM

## 2017-09-05 DIAGNOSIS — I517 Cardiomegaly: Secondary | ICD-10-CM | POA: Insufficient documentation

## 2017-09-05 DIAGNOSIS — I5043 Acute on chronic combined systolic (congestive) and diastolic (congestive) heart failure: Secondary | ICD-10-CM | POA: Diagnosis present

## 2017-09-05 DIAGNOSIS — Z8249 Family history of ischemic heart disease and other diseases of the circulatory system: Secondary | ICD-10-CM

## 2017-09-05 DIAGNOSIS — I447 Left bundle-branch block, unspecified: Secondary | ICD-10-CM | POA: Diagnosis present

## 2017-09-05 DIAGNOSIS — Z955 Presence of coronary angioplasty implant and graft: Secondary | ICD-10-CM | POA: Diagnosis not present

## 2017-09-05 DIAGNOSIS — Z6841 Body Mass Index (BMI) 40.0 and over, adult: Secondary | ICD-10-CM

## 2017-09-05 DIAGNOSIS — N179 Acute kidney failure, unspecified: Secondary | ICD-10-CM | POA: Diagnosis not present

## 2017-09-05 DIAGNOSIS — I255 Ischemic cardiomyopathy: Secondary | ICD-10-CM | POA: Diagnosis present

## 2017-09-05 DIAGNOSIS — H409 Unspecified glaucoma: Secondary | ICD-10-CM | POA: Diagnosis present

## 2017-09-05 DIAGNOSIS — E890 Postprocedural hypothyroidism: Secondary | ICD-10-CM | POA: Diagnosis present

## 2017-09-05 DIAGNOSIS — I110 Hypertensive heart disease with heart failure: Principal | ICD-10-CM | POA: Diagnosis present

## 2017-09-05 DIAGNOSIS — Z90710 Acquired absence of both cervix and uterus: Secondary | ICD-10-CM

## 2017-09-05 DIAGNOSIS — I2510 Atherosclerotic heart disease of native coronary artery without angina pectoris: Secondary | ICD-10-CM

## 2017-09-05 DIAGNOSIS — I10 Essential (primary) hypertension: Secondary | ICD-10-CM | POA: Diagnosis not present

## 2017-09-05 DIAGNOSIS — G4730 Sleep apnea, unspecified: Secondary | ICD-10-CM | POA: Diagnosis present

## 2017-09-05 DIAGNOSIS — I509 Heart failure, unspecified: Secondary | ICD-10-CM

## 2017-09-05 DIAGNOSIS — Z800 Family history of malignant neoplasm of digestive organs: Secondary | ICD-10-CM

## 2017-09-05 DIAGNOSIS — K5900 Constipation, unspecified: Secondary | ICD-10-CM | POA: Diagnosis not present

## 2017-09-05 DIAGNOSIS — IMO0002 Reserved for concepts with insufficient information to code with codable children: Secondary | ICD-10-CM | POA: Diagnosis present

## 2017-09-05 DIAGNOSIS — K219 Gastro-esophageal reflux disease without esophagitis: Secondary | ICD-10-CM | POA: Diagnosis present

## 2017-09-05 DIAGNOSIS — R778 Other specified abnormalities of plasma proteins: Secondary | ICD-10-CM | POA: Diagnosis present

## 2017-09-05 DIAGNOSIS — I5021 Acute systolic (congestive) heart failure: Secondary | ICD-10-CM | POA: Diagnosis not present

## 2017-09-05 HISTORY — DX: Atherosclerotic heart disease of native coronary artery without angina pectoris: I25.10

## 2017-09-05 HISTORY — DX: Dyspnea, unspecified: R06.00

## 2017-09-05 HISTORY — DX: Unspecified osteoarthritis, unspecified site: M19.90

## 2017-09-05 LAB — COMPREHENSIVE METABOLIC PANEL
ALBUMIN: 3.7 g/dL (ref 3.5–5.0)
ALT: 34 U/L (ref 14–54)
ANION GAP: 10 (ref 5–15)
AST: 36 U/L (ref 15–41)
Alkaline Phosphatase: 151 U/L — ABNORMAL HIGH (ref 38–126)
BILIRUBIN TOTAL: 0.9 mg/dL (ref 0.3–1.2)
BUN: 16 mg/dL (ref 6–20)
CO2: 25 mmol/L (ref 22–32)
Calcium: 8.8 mg/dL — ABNORMAL LOW (ref 8.9–10.3)
Chloride: 103 mmol/L (ref 101–111)
Creatinine, Ser: 1.09 mg/dL — ABNORMAL HIGH (ref 0.44–1.00)
GFR calc Af Amer: >60 mL/min (ref >60)
GFR calc non Af Amer: 54 mL/min — ABNORMAL LOW (ref >60)
GLUCOSE: 154 mg/dL — AB (ref 65–99)
POTASSIUM: 3.9 mmol/L (ref 3.5–5.1)
SODIUM: 138 mmol/L (ref 135–145)
TOTAL PROTEIN: 8.3 g/dL — AB (ref 6.5–8.1)

## 2017-09-05 LAB — CBC WITH DIFFERENTIAL/PLATELET
BASOS ABS: 0 10*3/uL (ref 0.0–0.1)
BASOS PCT: 0 %
EOS ABS: 0.8 10*3/uL — AB (ref 0.0–0.7)
Eosinophils Relative: 11 %
HEMATOCRIT: 38.7 % (ref 36.0–46.0)
HEMOGLOBIN: 12.3 g/dL (ref 12.0–15.0)
Lymphocytes Relative: 34 %
Lymphs Abs: 2.4 10*3/uL (ref 0.7–4.0)
MCH: 26.1 pg (ref 26.0–34.0)
MCHC: 31.8 g/dL (ref 30.0–36.0)
MCV: 82 fL (ref 78.0–100.0)
Monocytes Absolute: 0.3 10*3/uL (ref 0.1–1.0)
Monocytes Relative: 4 %
NEUTROS ABS: 3.5 10*3/uL (ref 1.7–7.7)
Neutrophils Relative %: 51 %
Platelets: 229 10*3/uL (ref 150–400)
RBC: 4.72 MIL/uL (ref 3.87–5.11)
RDW: 14 % (ref 11.5–15.5)
WBC: 7.1 10*3/uL (ref 4.0–10.5)

## 2017-09-05 LAB — D-DIMER, QUANTITATIVE: D-Dimer, Quant: 0.37 ug/mL-FEU (ref 0.00–0.50)

## 2017-09-05 LAB — GLUCOSE, CAPILLARY: GLUCOSE-CAPILLARY: 184 mg/dL — AB (ref 65–99)

## 2017-09-05 LAB — TROPONIN I: Troponin I: 0.05 ng/mL (ref <0.03)

## 2017-09-05 LAB — TSH: TSH: 14.361 u[IU]/mL — AB (ref 0.350–4.500)

## 2017-09-05 LAB — BRAIN NATRIURETIC PEPTIDE: B Natriuretic Peptide: 386 pg/mL — ABNORMAL HIGH (ref 0.0–100.0)

## 2017-09-05 MED ORDER — INSULIN ASPART 100 UNIT/ML ~~LOC~~ SOLN
0.0000 [IU] | Freq: Three times a day (TID) | SUBCUTANEOUS | Status: DC
  Administered 2017-09-06: 4 [IU] via SUBCUTANEOUS
  Administered 2017-09-06 (×2): 3 [IU] via SUBCUTANEOUS
  Administered 2017-09-07: 4 [IU] via SUBCUTANEOUS
  Administered 2017-09-07: 7 [IU] via SUBCUTANEOUS
  Administered 2017-09-07: 3 [IU] via SUBCUTANEOUS
  Administered 2017-09-08: 4 [IU] via SUBCUTANEOUS
  Administered 2017-09-08 (×2): 3 [IU] via SUBCUTANEOUS
  Administered 2017-09-09 (×2): 4 [IU] via SUBCUTANEOUS
  Administered 2017-09-10: 3 [IU] via SUBCUTANEOUS
  Administered 2017-09-10 – 2017-09-11 (×3): 4 [IU] via SUBCUTANEOUS
  Administered 2017-09-12 – 2017-09-13 (×2): 3 [IU] via SUBCUTANEOUS
  Administered 2017-09-13 (×2): 4 [IU] via SUBCUTANEOUS
  Administered 2017-09-14: 7 [IU] via SUBCUTANEOUS

## 2017-09-05 MED ORDER — SODIUM CHLORIDE 0.9% FLUSH: 3.0000 mL | INTRAVENOUS | Status: DC | PRN

## 2017-09-05 MED ORDER — FUROSEMIDE 10 MG/ML IJ SOLN
40.0000 mg | Freq: Once | INTRAMUSCULAR | Status: AC
  Administered 2017-09-05: 40 mg via INTRAVENOUS
  Filled 2017-09-05: qty 4

## 2017-09-05 MED ORDER — INSULIN ASPART 100 UNIT/ML ~~LOC~~ SOLN: 0.0000 [IU] | Freq: Every day | SUBCUTANEOUS | Status: DC

## 2017-09-05 MED ORDER — SODIUM CHLORIDE 0.9 % IV SOLN: 250.0000 mL | INTRAVENOUS | Status: DC | PRN

## 2017-09-05 MED ORDER — HYDROCHLOROTHIAZIDE 25 MG PO TABS
25.0000 mg | ORAL_TABLET | Freq: Every day | ORAL | Status: DC
  Administered 2017-09-06: 25 mg via ORAL
  Filled 2017-09-05: qty 1

## 2017-09-05 MED ORDER — LEVOTHYROXINE SODIUM 100 MCG PO TABS
200.0000 ug | ORAL_TABLET | Freq: Every day | ORAL | Status: DC
  Administered 2017-09-06 – 2017-09-07 (×2): 200 ug via ORAL
  Filled 2017-09-05 (×2): qty 2

## 2017-09-05 MED ORDER — ONDANSETRON HCL 4 MG/2ML IJ SOLN
4.0000 mg | Freq: Four times a day (QID) | INTRAMUSCULAR | Status: DC | PRN
  Administered 2017-09-06 – 2017-09-11 (×4): 4 mg via INTRAVENOUS
  Filled 2017-09-05 (×5): qty 2

## 2017-09-05 MED ORDER — LOSARTAN POTASSIUM 50 MG PO TABS
100.0000 mg | ORAL_TABLET | Freq: Every day | ORAL | Status: DC
  Administered 2017-09-06 – 2017-09-14 (×9): 100 mg via ORAL
  Filled 2017-09-05 (×9): qty 2

## 2017-09-05 MED ORDER — HYDRALAZINE HCL 20 MG/ML IJ SOLN
5.0000 mg | INTRAMUSCULAR | Status: DC | PRN
  Administered 2017-09-05 – 2017-09-10 (×2): 5 mg via INTRAVENOUS
  Filled 2017-09-05 (×2): qty 1

## 2017-09-05 MED ORDER — LABETALOL HCL 200 MG PO TABS
300.0000 mg | ORAL_TABLET | Freq: Two times a day (BID) | ORAL | Status: DC
  Administered 2017-09-05 – 2017-09-06 (×3): 300 mg via ORAL
  Filled 2017-09-05 (×4): qty 2

## 2017-09-05 MED ORDER — IPRATROPIUM-ALBUTEROL 0.5-2.5 (3) MG/3ML IN SOLN
3.0000 mL | Freq: Once | RESPIRATORY_TRACT | Status: AC
  Administered 2017-09-05: 3 mL via RESPIRATORY_TRACT
  Filled 2017-09-05: qty 3

## 2017-09-05 MED ORDER — FUROSEMIDE 10 MG/ML IJ SOLN
40.0000 mg | Freq: Two times a day (BID) | INTRAMUSCULAR | Status: DC
  Administered 2017-09-06 – 2017-09-07 (×3): 40 mg via INTRAVENOUS
  Filled 2017-09-05 (×3): qty 4

## 2017-09-05 MED ORDER — SODIUM CHLORIDE 0.9% FLUSH
3.0000 mL | Freq: Two times a day (BID) | INTRAVENOUS | Status: DC
  Administered 2017-09-06 – 2017-09-11 (×11): 3 mL via INTRAVENOUS

## 2017-09-05 MED ORDER — ENOXAPARIN SODIUM 40 MG/0.4ML ~~LOC~~ SOLN
40.0000 mg | SUBCUTANEOUS | Status: DC
  Administered 2017-09-05 – 2017-09-09 (×5): 40 mg via SUBCUTANEOUS
  Filled 2017-09-05 (×5): qty 0.4

## 2017-09-05 MED ORDER — ACETAMINOPHEN 325 MG PO TABS
650.0000 mg | ORAL_TABLET | ORAL | Status: DC | PRN
  Administered 2017-09-06 – 2017-09-13 (×6): 650 mg via ORAL
  Filled 2017-09-05 (×6): qty 2

## 2017-09-05 MED ORDER — SIMVASTATIN 10 MG PO TABS
10.0000 mg | ORAL_TABLET | Freq: Every evening | ORAL | Status: DC
  Administered 2017-09-06 – 2017-09-09 (×4): 10 mg via ORAL
  Filled 2017-09-05 (×4): qty 1

## 2017-09-05 MED ORDER — LOSARTAN POTASSIUM-HCTZ 100-25 MG PO TABS: 1.0000 | ORAL_TABLET | Freq: Every day | ORAL | Status: DC

## 2017-09-05 NOTE — ED Triage Notes (Signed)
Pt c/o increased sob with chills that started this evening. Pt states she does not feel good.

## 2017-09-05 NOTE — ED Notes (Signed)
Date and time results received: 09/05/17 2130 (use smartphrase ".now" to insert current time)  Test: troponin Critical Value: 0.05  Name of Provider Notified: Dr. Lacinda Axon  Orders Received? Or Actions Taken?: no/na

## 2017-09-05 NOTE — H&P (Signed)
History and Physical    EREKA BRAU Jackson:[id number] DOB: 08-May-1956 DOA: 09/05/2017  PCP: Sherrie Sport Consultants:  None Patient coming from: Home - lives alone; NOK: Daughter, [phone]  Chief Complaint: SOB  HPI: Maureen Jackson is a 61 y.o. female with medical history significant of morbid obesity, hypothyroidism, OSA not on CPAP, HTN, DM, and glaucoma presenting with SOB.  Patient was in [employer] today.  She walked into the store and got short-winded and broke into hot sweats.  She had to sit down, panting for breath.  She has been wheezing since last week.  +/- unilateral LE edema.  She has been short-winded - would go to the bathroom and by the time she got back to her chair she would have to sit down to catch her breath.  She noticed it last week but it got bad today.  No CP.  Slight dry cough.  No fevers.   ED Course: CXR with pulmonary edema and cardiomegaly.  Minimal troponin elevation.  Lasix ordered.  Review of Systems: As per HPI; otherwise review of systems reviewed and negative.   Ambulatory Status:   Ambulates without assistance but thinks she needs a cane when trying to step up onto a curb  Past Medical History:  Diagnosis Date  . Diabetes mellitus   . Glaucoma   . Hypertension   . Morbid obesity ([place])   . Obstructive sleep apnea    does not wear CPAP  . Thyroid disease     Past Surgical History:  Procedure Laterality Date  . ABDOMINAL HYSTERECTOMY    . THYROID SURGERY      Social History   Social History  . Marital status: Widowed    Spouse name: N/A  . Number of children: N/A  . Years of education: N/A   Occupational History  . 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 Smoking status: Never Smoker  . Smokeless tobacco: Never Used  . Alcohol use No  . Drug use: No  . Sexual activity: No   Other Topics Concern  . Not on file   Social History Narrative  . No narrative on file    Allergies  Allergen  Reactions  . Aspirin Nausea Only    Family History  Problem Relation Age of Onset  . Diabetes Mother   . Hypertension Mother   . Cancer Mother        pancreas  . Hypertension Sister     Prior to Admission medications   Medication Sig Start Date End Date Taking? Authorizing Provider  amoxicillin (AMOXIL) 500 MG capsule Take 1 capsule (500 mg total) by mouth 3 (three) times daily. 02/14/16   Konrad Felix, PA  levothyroxine (SYNTHROID, LEVOTHROID) 150 MCG tablet Take 1 tablet (150 mcg total) by mouth daily before breakfast. 10/03/15   Ward, Delice Bison, DO  metFORMIN (GLUCOPHAGE) 1000 MG tablet Take 1 tablet (1,000 mg total) by mouth 2 (two) times daily with a meal. 10/03/15   Ward, Delice Bison, DO  metoprolol (LOPRESSOR) 50 MG tablet Take 1 tablet (50 mg total) by mouth 2 (two) times daily. 10/03/15   Ward, Delice Bison, DO  timolol (BETIMOL) 0.5 % ophthalmic solution Place 1 drop into both eyes 2 (two) times daily. 10/03/15   Ward, Delice Bison, DO    Physical Exam: Vitals:   09/05/17 2024 09/05/17 2130 09/05/17 2215 09/05/17 2216  BP:  (!) 194/95 (!) 225/99 (!) 226/104  Pulse:  Marland Kitchen)  101 (!) 104 (!) 101  Resp:  (!) 22 20 (!) 21  Temp:      TempSrc:      SpO2: 96% 96% 96% 97%  Weight:      Height:         General:  Appears calm and comfortable and is NAD.   She is very obese and appears very sedentary. Eyes:  PERRL, EOMI, normal lids, iris ENT:  grossly normal hearing, lips & tongue, mmm Neck:  no LAD, masses or thyromegaly; no carotid bruits Cardiovascular:  RRR, no m/r/g. No LE edema.  Respiratory:   CTA bilaterally with no wheezes/rales/rhonchi.  Mildly increased respiratory effort. Abdomen:  soft, NT, ND, NABS, morbidly obese Back:   normal alignment, no CVAT Skin:  no rash or induration seen on limited exam; she has many skin tags Musculoskeletal:  grossly normal tone BUE/BLE, good ROM, no bony abnormality Psychiatric:  grossly normal mood and affect, speech fluent and  appropriate, AOx3 Neurologic:  CN 2-12 grossly intact, moves all extremities in coordinated fashion, sensation intact    Radiological Exams on Admission: Dg Chest Port 1 View  Result Date: 09/05/2017 CLINICAL DATA:  One-week history of cough. Acute onset of shortness of breath and chills that began this evening. Current history of diabetes and hypertension. EXAM: PORTABLE CHEST 1 VIEW COMPARISON:  03/08/2010, 05/07/2009 and earlier. FINDINGS: Massive cardiac enlargement, significantly increased in size since 2011. Pulmonary venous hypertension and mild diffuse interstitial pulmonary edema. No visible pleural effusions. IMPRESSION: Mild CHF, with massive cardiomegaly and mild diffuse interstitial pulmonary edema. The heart has significantly increased in size since 2011. Electronically Signed   By: Evangeline Dakin M.D.   On: 09/05/2017 20:31    EKG: Independently reviewed.  Sinus tachycardia with rate 97; nonspecific ST changes that are more pronounced from prior study in 2016  Labs on Admission: I have personally reviewed the available labs and imaging studies at the time of the admission.  Pertinent labs:   Glucose 154 BUN 16/Creatinine 1.09/GFR >60 Troponin 0.05 D-dimer 0.37  Assessment/Plan Principal Problem:   CHF (congestive heart failure) (HCC) Active Problems:   Hypothyroidism   Essential hypertension   Sleep apnea   Diabetes type 2, uncontrolled ([place])   Cardiomegaly   CHF with "massive" cardiomegaly -Patient without prior h/o CHF presenting with worsening SOB  -Patient without smoking history or prior h/o respiratory failure  -CXR with "massive" cardiomegaly which has worsened significantly since the last study and pulmonary edema -Normal WBC count, no fever; will not give antibiotics at this time -BNP pending -Will place in observation status with telemetry -Will request echocardiogram -Will request cardiology consult -Cardiomegaly is likely related to morbid obesity  as well as long-standing poorly controlled HTN -Will not start ASA due to reported nausea as side effect -Will continue Hyzaar for now - but with addition of Lasix, the HCTZ component may need to be discontinued -Continue Labetalol - although with her very high BP and tachycardia, compliance may also be in question here -CHF order set utilized; may need CHF team consult but will hold until Echo results are available -Was given Lasix 40 mg x 1 in ER and will repeat with 40 mg IV BID -prn [place] O2 for now -Normal kidney function at this time, will follow -Repeat EKG in AM -Will r/o with serial troponins although doubt ACS based on symptoms; suspect that mild elevation is due to demand ischemia since the patient denies chest pain.  HTN -Poor control in the  ER -Will give today's home medications PO and also cover with prn IV hydralazine.  DM -Poor control with last A1c of 10.1 in 2014 -Recheck A1c -Hold PO meds and cover with resistant-scale SSI  Hypothyroidism -Last recorded TSH prior to today was 84.024 -Today's is much improved from that, at 14.361 -However, this is still very poor control -Will order free T4 -She is reportedly taking 200 mcg daily of Synthroid now -Will continue this for now as we await free T4 result and also query the patient carefully about compliance -Based on current TSH, it appears that her dose will need to be increased with close outpatient f/u  Morbid obesity -Desperately needs weight loss but also medication compliance in order to have a chance at improving her health  OSA -Does not wear CPAP  DVT prophylaxis: Lovenox  Code Status:  Full - confirmed with patient Family Communication: None present Disposition Plan:  Home once clinically improved Consults called: Cardiology  Admission status: It is my clinical opinion that referral for OBSERVATION is reasonable and necessary in this patient based on the above information provided. The aforementioned taken  together are felt to place the patient at high risk for further clinical deterioration. However it is anticipated that the patient may be medically stable for discharge from the hospital within 24 to 48 hours.    Karmen Bongo MD [facility]  If note is complete, please contact covering daytime or nighttime physician. [email or web address] Password [facility]  09/05/2017, 10:26 PM

## 2017-09-05 NOTE — ED Provider Notes (Signed)
[facility] DEPT Provider Note   CSN: [id number] Arrival date & time: 09/05/17  1945     History   Chief Complaint Chief Complaint  Patient presents with  . Shortness of Breath    HPI Maureen Jackson is a 61 y.o. female.  Abrupt onset of dyspnea this evening with associated minimal chest pain. No diaphoresis or nausea. Past medical history includes obesity, diabetes, sleep apnea,hypothyroidism, many others. Severity is moderate. Exertion makes symptoms worse.      Past Medical History:  Diagnosis Date  . Diabetes mellitus   . Glaucoma   . Hypertension   . Morbid obesity ([place])   . Obstructive sleep apnea   . Thyroid disease     Patient Active Problem List   Diagnosis Date Noted  . Morbid obesity ([place]) 03/04/2013  . Labyrinthitis 03/04/2013  . Diabetes type 2, uncontrolled ([place]) 03/04/2013  . GASTRITIS 12/13/2006  . HIATAL HERNIA, HX OF 12/13/2006  . THYROIDECTOMY, HX OF 12/13/2006  . HYPOTHYROIDISM 10/04/2006  . OBESITY 10/04/2006  . TOBACCO ABUSE 10/04/2006  . CARPAL TUNNEL SYNDROME 10/04/2006  . HYPERTENSION 10/04/2006  . GERD 10/04/2006  . POSTMENOPAUSAL STATUS 10/04/2006  . SKIN TAG 10/04/2006  . KNEE PAIN, LEFT 10/04/2006  . SLEEP APNEA 10/04/2006  . LEG EDEMA 10/04/2006    Past Surgical History:  Procedure Laterality Date  . ABDOMINAL HYSTERECTOMY    . THYROID SURGERY      OB History    No data available       Home Medications    Prior to Admission medications   Medication Sig Start Date End Date Taking? Authorizing Provider  amoxicillin (AMOXIL) 500 MG capsule Take 1 capsule (500 mg total) by mouth 3 (three) times daily. 02/14/16   Konrad Felix, PA  levothyroxine (SYNTHROID, LEVOTHROID) 150 MCG tablet Take 1 tablet (150 mcg total) by mouth daily before breakfast. 10/03/15   Ward, Delice Bison, DO  metFORMIN (GLUCOPHAGE) 1000 MG tablet Take 1 tablet (1,000 mg total) by mouth 2 (two) times daily with a meal. 10/03/15   Ward, Delice Bison, DO   metoprolol (LOPRESSOR) 50 MG tablet Take 1 tablet (50 mg total) by mouth 2 (two) times daily. 10/03/15   Ward, Delice Bison, DO  timolol (BETIMOL) 0.5 % ophthalmic solution Place 1 drop into both eyes 2 (two) times daily. 10/03/15   Ward, Delice Bison, DO    Family History Family History  Problem Relation Age of Onset  . Diabetes Mother   . Hypertension Mother   . Cancer Mother        pancreas  . Hypertension Sister     Social History Social History  Substance Use Topics  . Smoking status: Never Smoker  . Smokeless tobacco: Never Used  . Alcohol use No     Allergies   Aspirin   Review of Systems Review of Systems  All other systems reviewed and are negative.    Physical Exam Updated Vital Signs BP (!) 194/95   Pulse (!) 101   Temp 98.3 F (36.8 C) (Oral)   Resp (!) 22   Ht 5\' 5"  (1.651 m)   Wt (!) 141.5 kg (312 lb)   SpO2 96%   BMI 51.92 kg/m   Physical Exam  Constitutional: She is oriented to person, place, and time.  Obese, dyspneic  HENT:  Head: Normocephalic and atraumatic.  Eyes: Conjunctivae are normal.  Neck: Neck supple.  Cardiovascular: Normal rate and regular rhythm.   Pulmonary/Chest: Effort normal and breath  sounds normal.  Abdominal: Soft. Bowel sounds are normal.  Musculoskeletal: Normal range of motion.  Neurological: She is alert and oriented to person, place, and time.  Skin: Skin is warm and dry.  Psychiatric: She has a normal mood and affect. Her behavior is normal.  Nursing note and vitals reviewed.    ED Treatments / Results  Labs (all labs ordered are listed, but only abnormal results are displayed) Labs Reviewed  CBC WITH DIFFERENTIAL/PLATELET - Abnormal; Notable for the following:       Result Value   Eosinophils Absolute 0.8 (*)    All other components within normal limits  COMPREHENSIVE METABOLIC PANEL - Abnormal; Notable for the following:    Glucose, Bld 154 (*)    Creatinine, Ser 1.09 (*)    Calcium 8.8 (*)    Total  Protein 8.3 (*)    Alkaline Phosphatase 151 (*)    GFR calc non Af Amer 54 (*)    All other components within normal limits  TROPONIN I - Abnormal; Notable for the following:    Troponin I 0.05 (*)    All other components within normal limits  D-DIMER, QUANTITATIVE (NOT AT [facility])    EKG  EKG Interpretation None       Radiology Dg Chest Port 1 View  Result Date: 09/05/2017 CLINICAL DATA:  One-week history of cough. Acute onset of shortness of breath and chills that began this evening. Current history of diabetes and hypertension. EXAM: PORTABLE CHEST 1 VIEW COMPARISON:  03/08/2010, 05/07/2009 and earlier. FINDINGS: Massive cardiac enlargement, significantly increased in size since 2011. Pulmonary venous hypertension and mild diffuse interstitial pulmonary edema. No visible pleural effusions. IMPRESSION: Mild CHF, with massive cardiomegaly and mild diffuse interstitial pulmonary edema. The heart has significantly increased in size since 2011. Electronically Signed   By: Evangeline Dakin M.D.   On: 09/05/2017 20:31    Procedures Procedures (including critical care time)  Medications Ordered in ED Medications  furosemide (LASIX) injection 40 mg (not administered)  ipratropium-albuterol (DUONEB) 0.5-2.5 (3) MG/3ML nebulizer solution 3 mL (3 mLs Nebulization Given 09/05/17 2024)     Initial Impression / Assessment and Plan / ED Course  I have reviewed the triage vital signs and the nursing notes.  Pertinent labs & imaging results that were available during my care of the patient were reviewed by me and considered in my medical decision making (see chart for details).     Patient presents with abrupt onset of dyspnea.Chest x-ray suggests pulmonary edema with cardiomegaly.  Troponin minimally elevated. IV Lasix ordered. She is hemodynamically stable. Will admit to general medicine.  Final Clinical Impressions(s) / ED Diagnoses   Final diagnoses:  Acute congestive heart failure,  unspecified heart failure type [facility])  Cardiomegaly    New Prescriptions New Prescriptions   No medications on file     Nat Christen, MD 09/05/17 2153

## 2017-09-06 ENCOUNTER — Observation Stay (HOSPITAL_BASED_OUTPATIENT_CLINIC_OR_DEPARTMENT_OTHER): Payer: Medicare HMO

## 2017-09-06 DIAGNOSIS — E039 Hypothyroidism, unspecified: Secondary | ICD-10-CM

## 2017-09-06 DIAGNOSIS — I509 Heart failure, unspecified: Secondary | ICD-10-CM | POA: Diagnosis not present

## 2017-09-06 DIAGNOSIS — I5043 Acute on chronic combined systolic (congestive) and diastolic (congestive) heart failure: Secondary | ICD-10-CM | POA: Diagnosis not present

## 2017-09-06 DIAGNOSIS — H409 Unspecified glaucoma: Secondary | ICD-10-CM | POA: Diagnosis present

## 2017-09-06 DIAGNOSIS — I447 Left bundle-branch block, unspecified: Secondary | ICD-10-CM | POA: Diagnosis present

## 2017-09-06 DIAGNOSIS — I2510 Atherosclerotic heart disease of native coronary artery without angina pectoris: Secondary | ICD-10-CM | POA: Diagnosis not present

## 2017-09-06 DIAGNOSIS — E118 Type 2 diabetes mellitus with unspecified complications: Secondary | ICD-10-CM | POA: Diagnosis not present

## 2017-09-06 DIAGNOSIS — Z800 Family history of malignant neoplasm of digestive organs: Secondary | ICD-10-CM | POA: Diagnosis not present

## 2017-09-06 DIAGNOSIS — I5033 Acute on chronic diastolic (congestive) heart failure: Secondary | ICD-10-CM | POA: Diagnosis not present

## 2017-09-06 DIAGNOSIS — Z8249 Family history of ischemic heart disease and other diseases of the circulatory system: Secondary | ICD-10-CM | POA: Diagnosis not present

## 2017-09-06 DIAGNOSIS — I10 Essential (primary) hypertension: Secondary | ICD-10-CM

## 2017-09-06 DIAGNOSIS — I248 Other forms of acute ischemic heart disease: Secondary | ICD-10-CM | POA: Diagnosis not present

## 2017-09-06 DIAGNOSIS — Z886 Allergy status to analgesic agent status: Secondary | ICD-10-CM | POA: Diagnosis not present

## 2017-09-06 DIAGNOSIS — E038 Other specified hypothyroidism: Secondary | ICD-10-CM | POA: Diagnosis not present

## 2017-09-06 DIAGNOSIS — N179 Acute kidney failure, unspecified: Secondary | ICD-10-CM | POA: Diagnosis not present

## 2017-09-06 DIAGNOSIS — I25110 Atherosclerotic heart disease of native coronary artery with unstable angina pectoris: Secondary | ICD-10-CM | POA: Diagnosis not present

## 2017-09-06 DIAGNOSIS — Z90710 Acquired absence of both cervix and uterus: Secondary | ICD-10-CM | POA: Diagnosis not present

## 2017-09-06 DIAGNOSIS — I5041 Acute combined systolic (congestive) and diastolic (congestive) heart failure: Secondary | ICD-10-CM

## 2017-09-06 DIAGNOSIS — Z833 Family history of diabetes mellitus: Secondary | ICD-10-CM | POA: Diagnosis not present

## 2017-09-06 DIAGNOSIS — I517 Cardiomegaly: Secondary | ICD-10-CM | POA: Diagnosis not present

## 2017-09-06 DIAGNOSIS — I5042 Chronic combined systolic (congestive) and diastolic (congestive) heart failure: Secondary | ICD-10-CM | POA: Diagnosis not present

## 2017-09-06 DIAGNOSIS — Z9119 Patient's noncompliance with other medical treatment and regimen: Secondary | ICD-10-CM | POA: Diagnosis not present

## 2017-09-06 DIAGNOSIS — Z6841 Body Mass Index (BMI) 40.0 and over, adult: Secondary | ICD-10-CM | POA: Diagnosis not present

## 2017-09-06 DIAGNOSIS — K5900 Constipation, unspecified: Secondary | ICD-10-CM | POA: Diagnosis not present

## 2017-09-06 DIAGNOSIS — E890 Postprocedural hypothyroidism: Secondary | ICD-10-CM | POA: Diagnosis not present

## 2017-09-06 DIAGNOSIS — I313 Pericardial effusion (noninflammatory): Secondary | ICD-10-CM | POA: Diagnosis not present

## 2017-09-06 DIAGNOSIS — J449 Chronic obstructive pulmonary disease, unspecified: Secondary | ICD-10-CM | POA: Diagnosis present

## 2017-09-06 DIAGNOSIS — I110 Hypertensive heart disease with heart failure: Secondary | ICD-10-CM | POA: Diagnosis not present

## 2017-09-06 DIAGNOSIS — E119 Type 2 diabetes mellitus without complications: Secondary | ICD-10-CM | POA: Diagnosis present

## 2017-09-06 DIAGNOSIS — I5021 Acute systolic (congestive) heart failure: Secondary | ICD-10-CM | POA: Diagnosis not present

## 2017-09-06 DIAGNOSIS — Z7984 Long term (current) use of oral hypoglycemic drugs: Secondary | ICD-10-CM | POA: Diagnosis not present

## 2017-09-06 DIAGNOSIS — R748 Abnormal levels of other serum enzymes: Secondary | ICD-10-CM | POA: Diagnosis not present

## 2017-09-06 DIAGNOSIS — G4733 Obstructive sleep apnea (adult) (pediatric): Secondary | ICD-10-CM | POA: Diagnosis not present

## 2017-09-06 DIAGNOSIS — Z955 Presence of coronary angioplasty implant and graft: Secondary | ICD-10-CM | POA: Diagnosis not present

## 2017-09-06 DIAGNOSIS — E1165 Type 2 diabetes mellitus with hyperglycemia: Secondary | ICD-10-CM | POA: Diagnosis not present

## 2017-09-06 DIAGNOSIS — K219 Gastro-esophageal reflux disease without esophagitis: Secondary | ICD-10-CM | POA: Diagnosis not present

## 2017-09-06 DIAGNOSIS — I255 Ischemic cardiomyopathy: Secondary | ICD-10-CM | POA: Diagnosis present

## 2017-09-06 LAB — ECHOCARDIOGRAM COMPLETE
AVLVOTPG: 2 mmHg
CHL CUP MV DEC (S): 211
CHL CUP STROKE VOLUME: 44 mL
EWDT: 211 ms
FS: 20 % — AB (ref 28–44)
HEIGHTINCHES: 65 in
IV/PV OW: 1.18
LA ID, A-P, ES: 45 mm
LA diam end sys: 45 mm
LA diam index: 1.72 cm/m2
LAVOL: 79.2 mL
LAVOLA4C: 74.5 mL
LAVOLIN: 30.2 mL/m2
LV dias vol index: 49 mL/m2
LVDIAVOL: 128 mL — AB (ref 46–106)
LVOT SV: 41 mL
LVOT VTI: 14.5 cm
LVOT area: 2.84 cm2
LVOT diameter: 19 mm
LVOT peak vel: 72.1 cm/s
LVSYSVOL: 84 mL — AB (ref 14–42)
LVSYSVOLIN: 32 mL/m2
MV Peak grad: 3 mmHg
MV pk A vel: 85 m/s
MV pk E vel: 82.3 m/s
PW: 11.9 mm — AB (ref 0.6–1.1)
RV LATERAL S' VELOCITY: 11.6 cm/s
Simpson's disk: 35
TAPSE: 23.2 mm
Weight: 4932.8 oz

## 2017-09-06 LAB — TROPONIN I
TROPONIN I: 0.06 ng/mL — AB (ref <0.03)
Troponin I: 0.08 ng/mL (ref <0.03)
Troponin I: 0.07 ng/mL (ref <0.03)

## 2017-09-06 LAB — BASIC METABOLIC PANEL
ANION GAP: 10 (ref 5–15)
BUN: 17 mg/dL (ref 6–20)
CO2: 26 mmol/L (ref 22–32)
Calcium: 8.5 mg/dL — ABNORMAL LOW (ref 8.9–10.3)
Chloride: 102 mmol/L (ref 101–111)
Creatinine, Ser: 1.15 mg/dL — ABNORMAL HIGH (ref 0.44–1.00)
GFR calc Af Amer: 59 mL/min — ABNORMAL LOW (ref >60)
GFR, EST NON AFRICAN AMERICAN: 51 mL/min — AB (ref >60)
GLUCOSE: 192 mg/dL — AB (ref 65–99)
POTASSIUM: 3.5 mmol/L (ref 3.5–5.1)
Sodium: 138 mmol/L (ref 135–145)

## 2017-09-06 LAB — GLUCOSE, CAPILLARY
GLUCOSE-CAPILLARY: 137 mg/dL — AB (ref 65–99)
Glucose-Capillary: 148 mg/dL — ABNORMAL HIGH (ref 65–99)
Glucose-Capillary: 168 mg/dL — ABNORMAL HIGH (ref 65–99)
Glucose-Capillary: 129 mg/dL — ABNORMAL HIGH (ref 65–99)

## 2017-09-06 LAB — CBC WITH DIFFERENTIAL/PLATELET
Basophils Absolute: 0 10*3/uL (ref 0.0–0.1)
Basophils Relative: 0 %
Eosinophils Absolute: 0.8 10*3/uL — ABNORMAL HIGH (ref 0.0–0.7)
Eosinophils Relative: 10 %
HEMATOCRIT: 36 % (ref 36.0–46.0)
HEMOGLOBIN: 11.8 g/dL — AB (ref 12.0–15.0)
LYMPHS ABS: 2.6 10*3/uL (ref 0.7–4.0)
LYMPHS PCT: 34 %
MCH: 26.9 pg (ref 26.0–34.0)
MCHC: 32.8 g/dL (ref 30.0–36.0)
MCV: 82.2 fL (ref 78.0–100.0)
MONOS PCT: 3 %
Monocytes Absolute: 0.3 10*3/uL (ref 0.1–1.0)
NEUTROS ABS: 4 10*3/uL (ref 1.7–7.7)
NEUTROS PCT: 53 %
Platelets: 223 10*3/uL (ref 150–400)
RBC: 4.38 MIL/uL (ref 3.87–5.11)
RDW: 14 % (ref 11.5–15.5)
WBC: 7.6 10*3/uL (ref 4.0–10.5)

## 2017-09-06 LAB — HEMOGLOBIN A1C
Hgb A1c MFr Bld: 7.1 % — ABNORMAL HIGH (ref 4.8–5.6)
Mean Plasma Glucose: 157.07 mg/dL

## 2017-09-06 LAB — T4, FREE: Free T4: 0.82 ng/dL (ref 0.61–1.12)

## 2017-09-06 MED ORDER — ORAL CARE MOUTH RINSE
15.0000 mL | Freq: Two times a day (BID) | OROMUCOSAL | Status: DC
  Administered 2017-09-08 – 2017-09-11 (×8): 15 mL via OROMUCOSAL

## 2017-09-06 MED ORDER — LORAZEPAM 1 MG PO TABS
1.0000 mg | ORAL_TABLET | Freq: Once | ORAL | Status: AC
  Administered 2017-09-06: 1 mg via ORAL
  Filled 2017-09-06: qty 1

## 2017-09-06 MED ORDER — POLYETHYLENE GLYCOL 3350 17 G PO PACK
17.0000 g | PACK | Freq: Every day | ORAL | Status: DC
  Administered 2017-09-08 – 2017-09-13 (×3): 17 g via ORAL
  Filled 2017-09-06 (×6): qty 1

## 2017-09-06 MED ORDER — MAGNESIUM HYDROXIDE 400 MG/5ML PO SUSP
30.0000 mL | Freq: Every day | ORAL | Status: DC | PRN
  Administered 2017-09-06: 30 mL via ORAL
  Filled 2017-09-06 (×2): qty 30

## 2017-09-06 MED ORDER — PERFLUTREN LIPID MICROSPHERE
1.0000 mL | INTRAVENOUS | Status: AC | PRN
  Administered 2017-09-06: 2 mL via INTRAVENOUS
  Administered 2017-09-06: 1 mL via INTRAVENOUS
  Filled 2017-09-06: qty 10

## 2017-09-06 NOTE — Progress Notes (Signed)
Ciritical troponin 0.08. MD made aware. No new orders at this time. Will continue to monitor patient.

## 2017-09-06 NOTE — Progress Notes (Signed)
PT Cancellation Note  Patient Details Name: Maureen Jackson MRN: [id number] DOB: September 02, 1956   Cancelled Treatment:    Reason Eval/Treat Not Completed: Patient at procedure or test/unavailable (Treatment attempted. Echo in room for procedure. Will attempt again at later date/time as available. )  10:29 AM, 09/06/17 Etta Grandchild, PT, DPT Physical Therapist - [place] [phone] (Office)    Massiel Stipp C 09/06/2017, 10:29 AM

## 2017-09-06 NOTE — Plan of Care (Signed)
Problem: Food- and Nutrition-Related Knowledge Deficit (NB-1.1) Goal: Nutrition education Formal process to instruct or train a patient/client in a skill or to impart knowledge to help patients/clients voluntarily manage or modify food choices and eating behavior to maintain or improve health. Outcome: Adequate for Discharge Nutrition Education Note  RD consulted for nutrition education regarding acute onset CHF. The patient is surprised that RD is here to educate for heart failure. She says this is news to her.   RD provided "Heart Failure Nutrition Therapy" handout from the Academy of [employer].   Reviewed patient's dietary recall.   Breakfast: instant oatmeal (1 pk) 8 oz pepsi (pt says I know I shouldn't be drinking those)  Lunch: likes to cook chicken and noodles (homemade). Emphasized limiting added salt and encouraged her to try some herbs to add flavor instead of using the salt shaker. 8 oz pepsi  Evening: is usually a snack such as cheese or peanut butter and townhouse crackers. She also likes to eat Fritos or other chips + water. Encouraged her to limit or avoid cheese, chips and purchase low sodium crackers.   Provided examples on ways to decrease sodium intake in diet. Discouraged intake of processed foods and use of salt shaker. Encouraged fresh fruits and vegetables as well as whole grain sources of carbohydrates to maximize fiber intake.   RD discussed why it is important for patient to adhere to diet recommendations, and emphasized the role of fluids, foods to avoid, and importance of weighing self daily. Teach back method used.   Expect fair compliance. Patient unable to state daily sodium intake goal or what qualifies a food as high sodium. Reviewed again with her a second time.  Body mass index is 51.3 kg/m. Pt meets criteria for obesity class III based on current BMI.  Current diet order is Heart healthy/ CHO modified diet, patient is consuming  approximately >75% of meals at this time. Labs and medications reviewed. No further nutrition interventions warranted at this time.  Recommend she follow up with outpatient with RD for additional nutrition counseling for wt loss and to assist with making significant dietary pattern changes that will help her manage her heart failure and potentially reduce readmission risk.   Colman Cater MS,RD,CSG,LDN Office: [phone] Pager: [phone]

## 2017-09-06 NOTE — Progress Notes (Signed)
PROGRESS NOTE  Maureen Jackson:[id number] DOB: 22-Feb-1956 DOA: 09/05/2017 PCP: Patient, No Pcp Per  HPI/Recap of past 28 hours:  61 year old female with past medical history of morbid obesity, hypertension and hypothyroidism admitted on 9/26 for shortness of breath with wheezing and found to be in acute congestive heart failure. BNP only mildly elevated at 386. New diagnosis for patient. Patient started on IV Lasix, cardiology consulted and echocardiogram ordered.  This morning, patient feeling a little bit better. Still some wheezing although she says her breathing is easier. Denies any chest pain. Became tearful when asked about stressful situations at home-some family stress with her daughter. No other complaints.  Assessment/Plan: Principal Problem:   Acute systolic/diastolic CHF (congestive heart failure) ([place]): BNP falsely low secondary to morbid obesity. Echocardiogram done this morning notes grade 1 diastolic dysfunction, findings consistent with left bundle branch block, moderate size pericardial effusion and EF of 30-35%. On IV Lasix. His so far has diuresed over 2 L. Given echo findings, suspect she will likely need ischemic workup and possible cardiac catheterization. Suspect underlying cause is poorly controlled hypertension, hypothyroidism and sleep apnea. See below. Active Problems:   Hypothyroidism: On Synthroid. Patient was unable to afford many of her medications and only consistently got back on them for months ago. Her previous TSH was at 84 2 years ago. Her current TSH is at 14. She has been on her current dose of Synthroid, 200 g, for the last 4 months consistently. We'll wait for free T4 to come back. If it is within normal levels, would not change Synthroid dose. Elevated TSH may be strictly from heart failure. If it is low, would slightly increase her Synthroid dose.    Essential hypertension: Home meds resumed. Expect her blood pressure to improve when she is better  diuresed. Already on ARB and beta blocker and diuretic.    Sleep apnea: Patient would certainly benefit from being on CPAP. We'll try to encourage utilization here in the hospital.   Diabetes type 2, without long-term use of insulin, controlled with complication: N5A at 7.1. Suspect she's has some acute elevated blood sugars in the setting of acute heart failure. However, she should likely discontinue her metformin now with confirmed history of heart failure.  Morbid obesity: Patient meets criteria with BMI greater than 40 Elevated troponin: Enzymes on admission only high-end of normal. Suspect the setting of heart failure and not acute MI  Code Status: Full code   Family Communication: Multiple family members at bedside including sister and cousin's   Disposition Plan: Continue inpatient. If she needs cardiac catheterization, she will need transfer [facility]    Consultants:  Cardiology   Procedures:  Echocardiogram noting ejection fraction 30-35 percent, grade 1 diastolic dysfunction, dyssynergy in the septum consistent with left bundle branch block   Antimicrobials:  None   DVT prophylaxis:  Lovenox   Objective: Vitals:   09/05/17 2321 09/06/17 0630 09/06/17 0924 09/06/17 1047  BP: (!) 183/89 123/72 (!) 146/78 (!) 160/84  Pulse: 90 70 79 79  Resp: 20 20 (!) 21 20  Temp: 98.5 F (36.9 C) 98.4 F (36.9 C) 98.4 F (36.9 C)   TempSrc: Oral Oral Oral   SpO2: 95% 97% 97% 99%  Weight: (!) 140.2 kg (309 lb 1.6 oz) (!) 139.8 kg (308 lb 4.8 oz)    Height: 5\' 5"  (1.651 m)       Intake/Output Summary (Last 24 hours) at 09/06/17 1402 Last data filed at 09/06/17 [phone]  Gross per 24 hour  Intake                0 ml  Output             2200 ml  Net            -2200 ml   Filed Weights   09/05/17 2216 09/05/17 2321 09/06/17 0630  Weight: (!) 140.2 kg (309 lb 1.6 oz) (!) 140.2 kg (309 lb 1.6 oz) (!) 139.8 kg (308 lb 4.8 oz)    Exam:   General:  Alert and oriented 3,  mild distress and tearful when she started talking about her daughter who causes her stress  HEENT: Normocephalic/atraumatic, narrow airway  Neck: Thick, no visible JVD  Cardiovascular: Soft, regular rate and rhythm, T6-R4, 2/6 systolic ejection murmur   Respiratory: Decreased breath sounds in part to body habitus, more so at bases   Abdomen: Soft, obese, nontender, positive bowel sounds   Musculoskeletal: No clubbing or cyanosis, 1+ pitting edema from the knees down  Neuro: No focal deficits   Skin: No skin breaks, tears or lesions  Psychiatry: Patient is appropriate, no evidence of psychoses    Data Reviewed: CBC:  Recent Labs Lab 09/05/17 2012 09/06/17 0201  WBC 7.1 7.6  NEUTROABS 3.5 4.0  HGB 12.3 11.8*  HCT 38.7 36.0  MCV 82.0 82.2  PLT 229 431   Basic Metabolic Panel:  Recent Labs Lab 09/05/17 2012 09/06/17 0201  NA 138 138  K 3.9 3.5  CL 103 102  CO2 25 26  GLUCOSE 154* 192*  BUN 16 17  CREATININE 1.09* 1.15*  CALCIUM 8.8* 8.5*   GFR: Estimated Creatinine Clearance: 74.9 mL/min (A) (by C-G formula based on SCr of 1.15 mg/dL (H)). Liver Function Tests:  Recent Labs Lab 09/05/17 2012  AST 36  ALT 34  ALKPHOS 151*  BILITOT 0.9  PROT 8.3*  ALBUMIN 3.7   No results for input(s): LIPASE, AMYLASE in the last 168 hours. No results for input(s): AMMONIA in the last 168 hours. Coagulation Profile: No results for input(s): INR, PROTIME in the last 168 hours. Cardiac Enzymes:  Recent Labs Lab 09/05/17 2012 09/06/17 0201 09/06/17 0841 09/06/17 1116  TROPONINI 0.05* 0.08* 0.07* 0.06*   BNP (last 3 results) No results for input(s): PROBNP in the last 8760 hours. HbA1C:  Recent Labs  09/06/17 0201  HGBA1C 7.1*   CBG:  Recent Labs Lab 09/05/17 2327 09/06/17 0739 09/06/17 1211  GLUCAP 184* 137* 148*   Lipid Profile: No results for input(s): CHOL, HDL, LDLCALC, TRIG, CHOLHDL, LDLDIRECT in the last 72 hours. Thyroid Function  Tests:  Recent Labs  09/05/17 2043  TSH 14.361*   Anemia Panel: No results for input(s): VITAMINB12, FOLATE, FERRITIN, TIBC, IRON, RETICCTPCT in the last 72 hours. Urine analysis:    Component Value Date/Time   COLORURINE YELLOW 10/02/2015 [place] 10/02/2015 2320   LABSPEC 1.015 10/02/2015 2320   PHURINE 6.5 10/02/2015 2320   GLUCOSEU 100 (A) 10/02/2015 2320   HGBUR NEGATIVE 10/02/2015 2320   BILIRUBINUR NEGATIVE 10/02/2015 2320   KETONESUR NEGATIVE 10/02/2015 2320   PROTEINUR NEGATIVE 10/02/2015 2320   UROBILINOGEN 0.2 10/02/2015 2320   NITRITE NEGATIVE 10/02/2015 2320   LEUKOCYTESUR NEGATIVE 10/02/2015 2320   Sepsis Labs: @LABRCNTIP (procalcitonin:4,lacticidven:4)  )No results found for this or any previous visit (from the past 240 hour(s)).    Studies: Dg Chest Port 1 View  Result Date: 09/05/2017 CLINICAL DATA:  One-week history  of cough. Acute onset of shortness of breath and chills that began this evening. Current history of diabetes and hypertension. EXAM: PORTABLE CHEST 1 VIEW COMPARISON:  03/08/2010, 05/07/2009 and earlier. FINDINGS: Massive cardiac enlargement, significantly increased in size since 2011. Pulmonary venous hypertension and mild diffuse interstitial pulmonary edema. No visible pleural effusions. IMPRESSION: Mild CHF, with massive cardiomegaly and mild diffuse interstitial pulmonary edema. The heart has significantly increased in size since 2011. Electronically Signed   By: Evangeline Dakin M.D.   On: 09/05/2017 20:31    Scheduled Meds: . enoxaparin (LOVENOX) injection  40 mg Subcutaneous Q24H  . furosemide  40 mg Intravenous Q12H  . insulin aspart  0-20 Units Subcutaneous TID WC  . insulin aspart  0-5 Units Subcutaneous QHS  . labetalol  300 mg Oral BID  . levothyroxine  200 mcg Oral QAC breakfast  . losartan  100 mg Oral Daily  . mouth rinse  15 mL Mouth Rinse BID  . simvastatin  10 mg Oral QPM  . sodium chloride flush  3 mL  Intravenous Q12H    Continuous Infusions: . sodium chloride       LOS: 0 days     Annita Brod, MD [facility] Pager [phone]  If 7PM-7AM, please contact night-coverage [email or web address] Password [email or web address] 09/06/2017, 2:02 PM

## 2017-09-06 NOTE — Progress Notes (Signed)
*  PRELIMINARY RESULTS* Echocardiogram 2D Echocardiogram has been performed with Definity.  Samuel Germany 09/06/2017, 11:51 AM

## 2017-09-06 NOTE — Care Management Note (Signed)
Case Management Note  Patient Details  Name: Maureen Jackson MRN: [id number] Date of Birth: Apr 02, 1956  Subjective/Objective:   Adm with new CHF. From home alone. No DME or HH PTA. She does not have weighing scales, CM will provide if scales are available on unit. Dietician consulted for diet teaching. Patient has PCP, Dr. Sherrie Sport in [place]. She would like a cane, PT eval pending.                 Action/Plan: CM following for needs.   Expected Discharge Date:       09/08/2017           Expected Discharge Plan:  [place]  In-House Referral:     Discharge planning Services  CM Consult  Post Acute Care Choice:  Durable Medical Equipment, Home Health Choice offered to:  Patient  DME Arranged:    DME Agency:     HH Arranged:    [place] Agency:     Status of Service:  In process, will continue to follow  If discussed at Long Length of Stay Meetings, dates discussed:    Additional Comments:  Jonisha Kindig, Chauncey [place], RN 09/06/2017, 9:46 AM

## 2017-09-06 NOTE — Care Management Obs Status (Signed)
[place] NOTIFICATION   Patient Details  Name: Maureen Jackson MRN: [id number] Date of Birth: [birth year]   Medicare Observation Status Notification Given:  Yes    Shawan Tosh, Chauncey Reading, RN 09/06/2017, 9:02 AM

## 2017-09-06 NOTE — Consult Note (Signed)
Primary cardiologist: n/a Consulting cardiologist: Dr Maureen Jackson Requesting physician: Dr Maureen Jackson Indication: SOB  Clinical Summary Maureen Jackson is a 61 y.o.female history of OSA not on cpap, obestiry, HTN, DM2, hypothyroidism admitted with SOB. Reports progressing symptoms over the last week. +orthopnea. + LE edema. +palpitaitons. She denies any chest pain. Reports severe DOE just walking to her bathroom at home.    WBC 7.1 Hgb 12.3 Plt 229 K 3.9 Cr 1.09, D-dimer 0.37, TSH 14, BNP 386,  Trop 0.05-->0.08--> CXR mild pulm edema, cardiomegaly EKG SR, LBBB (new since 09/2015) Echo pending   Allergies  Allergen Reactions  . Aspirin Nausea Only    Medications Scheduled Medications: . enoxaparin (LOVENOX) injection  40 mg Subcutaneous Q24H  . furosemide  40 mg Intravenous Q12H  . losartan  100 mg Oral Daily   And  . hydrochlorothiazide  25 mg Oral Daily  . insulin aspart  0-20 Units Subcutaneous TID WC  . insulin aspart  0-5 Units Subcutaneous QHS  . labetalol  300 mg Oral BID  . levothyroxine  200 mcg Oral QAC breakfast  . mouth rinse  15 mL Mouth Rinse BID  . simvastatin  10 mg Oral QPM  . sodium chloride flush  3 mL Intravenous Q12H     Infusions: . sodium chloride       PRN Medications:  sodium chloride, acetaminophen, hydrALAZINE, ondansetron (ZOFRAN) IV, sodium chloride flush   Past Medical History:  Diagnosis Date  . Diabetes mellitus   . Glaucoma   . Hypertension   . Morbid obesity ([place])   . Obstructive sleep apnea    does not wear CPAP  . Thyroid disease     Past Surgical History:  Procedure Laterality Date  . ABDOMINAL HYSTERECTOMY    . THYROID SURGERY      Family History  Problem Relation Age of Onset  . Diabetes Maureen Jackson   . Hypertension Maureen Jackson   . Cancer Maureen Jackson        pancreas  . Hypertension Maureen Jackson     Social History Maureen Jackson reports that she has never smoked. She has never used smokeless tobacco. Maureen Jackson reports  that she does not drink alcohol.  Review of Systems CONSTITUTIONAL: +fatigue HEENT: Eyes: No visual loss, blurred vision, double vision or yellow sclerae. No hearing loss, sneezing, congestion, runny nose or sore throat.  SKIN: No rash or itching.  CARDIOVASCULAR: per hpi RESPIRATORY: per HPI  GASTROINTESTINAL: No anorexia, nausea, vomiting or diarrhea. No abdominal pain or blood.  GENITOURINARY: no polyuria, no dysuria NEUROLOGICAL: No headache, dizziness, syncope, paralysis, ataxia, numbness or tingling in the extremities. No change in bowel or bladder control.  MUSCULOSKELETAL: No muscle, back pain, joint pain or stiffness.  HEMATOLOGIC: No anemia, bleeding or bruising.  LYMPHATICS: No enlarged nodes. No history of splenectomy.  PSYCHIATRIC: No history of depression or anxiety.      Physical Examination Blood pressure 123/72, pulse 70, temperature 98.4 F (36.9 C), temperature source Oral, resp. rate 20, height 5\' 5"  (1.651 m), weight (!) 308 lb 4.8 oz (139.8 kg), SpO2 97 %.  Intake/Output Summary (Last 24 hours) at 09/06/17 0848 Last data filed at 09/06/17 0000  Gross per 24 hour  Intake                0 ml  Output             1300 ml  Net            -1300 ml  HEENT: sclera clear, throat clear  Cardiovascular: RRR, no m/r,g no jvd  Respiratory: mild crackles bilateral  GI: abdomen soft, NT, nD  MSK: trace bilateral edema  Neuro: no focal deficits  Psych: appropriate affect   Lab Results  Basic Metabolic Panel:  Recent Labs Lab 09/05/17 2012 09/06/17 0201  NA 138 138  K 3.9 3.5  CL 103 102  CO2 25 26  GLUCOSE 154* 192*  BUN 16 17  CREATININE 1.09* 1.15*  CALCIUM 8.8* 8.5*    Liver Function Tests:  Recent Labs Lab 09/05/17 2012  AST 36  ALT 34  ALKPHOS 151*  BILITOT 0.9  PROT 8.3*  ALBUMIN 3.7    CBC:  Recent Labs Lab 09/05/17 2012 09/06/17 0201  WBC 7.1 7.6  NEUTROABS 3.5 4.0  HGB 12.3 11.8*  HCT 38.7 36.0  MCV 82.0 82.2    PLT 229 223    Cardiac Enzymes:  Recent Labs Lab 09/05/17 2012 09/06/17 0201  TROPONINI 0.05* 0.08*    BNP: Invalid input(s): POCBNP    Impression/Recommendations 1. Acute CHF - clinically presents with acute CHF, exact type is unknown at this time, echo is pending. With DM2, HTN, and LBBB possible ICM is high on the differential. Appears to be significantly hypothyroid which could also play a role(TSH as high as 84 2 years ago) - negative 1.3 liters since admit, mild uptrend in Cr. She is on lasix 40mg  IV bid. Will d/c HCTZ since on IV lasix - pending LVEF eval may need adjustments in her oral medications  2. Elevated troponin - fairly mild and flat thus far in setting of CHF, fairly nonspecific pattern thus far. Additional trop pending this AM - EKG shows new LBBB since 09/2015, do not suspect acute LBBB/AMI at this time given her presentation - f/u echo, pending LV function and wall motion may warrant ischemic testing tomorrow.    Maureen Jackson, M.D.

## 2017-09-06 NOTE — Evaluation (Signed)
Occupational Therapy Evaluation Patient Details Name: Maureen Jackson MRN: [id number] DOB: 03/15/1956 Today's Date: 09/06/2017    History of Present Illness Maureen Jackson is a 61 y.o. female with medical history significant of morbid obesity, hypothyroidism, OSA not on CPAP, HTN, DM, and glaucoma presenting with SOB.  Patient was in [place] today.  She walked into the store and got short-winded and broke into hot sweats.  She had to sit down, panting for breath.  She has been wheezing since last week.  +/- unilateral LE edema.  She has been short-winded - would go to the bathroom and by the time she got back to her chair she would have to sit down to catch her breath.  She noticed it last week but it got bad today.  No CP.  Slight dry cough.  No fevers.   Clinical Impression   Pt received supine in bed, agreeable to OT evaluation. Pt reports her heart has been racing occasionally this am, no pain or SOB, reports it races for a short time then is "normal." PTA pt independent in ADL completion, has been getting up overnight to use the [facility], PureWick placed overnight due to increased frequency of urination. Pt is at baseline with ADL completion, education completed on energy conservation strategies and information packet provided. Pt verbalizes understanding of strategies. Pt would benefit from tub/shower seat on discharge as she is easily fatigued and experiences SOB when showering. No further OT services required at this time.     Follow Up Recommendations  No OT follow up    Equipment Recommendations  Tub/shower seat       Precautions / Restrictions Precautions Precautions: None Restrictions Weight Bearing Restrictions: No      Mobility Bed Mobility Overal bed mobility: Modified Independent                Transfers                 General transfer comment: Not tested         ADL either performed or assessed with clinical judgement   ADL Overall ADL's : Modified  independent;At baseline Eating/Feeding: Modified independent;Bed level   Grooming: Modified independent;Sitting;Bed level               Lower Body Dressing: Modified independent;Sitting/lateral leans   Toilet Transfer: Modified Independent;BSC                   Vision Baseline Vision/History: No visual deficits Patient Visual Report: No change from baseline Vision Assessment?: No apparent visual deficits            Pertinent Vitals/Pain Pain Assessment: No/denies pain     Hand Dominance Right   Extremity/Trunk Assessment Upper Extremity Assessment Upper Extremity Assessment: Overall WFL for tasks assessed   Lower Extremity Assessment Lower Extremity Assessment: Defer to PT evaluation   Cervical / Trunk Assessment Cervical / Trunk Assessment: Normal   Communication Communication Communication: No difficulties   Cognition Arousal/Alertness: Awake/alert Behavior During Therapy: WFL for tasks assessed/performed Overall Cognitive Status: Within Functional Limits for tasks assessed                                                Home Living Family/patient expects to be discharged to:: Private residence Living Arrangements: Alone Available Help at Discharge: Family;Friend(s);Available PRN/intermittently Type of Home:  Apartment Home Access: Level entry     Home Layout: One level     Bathroom Shower/Tub: Teacher, early years/pre: Standard     Home Equipment: Grab bars - tub/shower;Grab bars - toilet          Prior Functioning/Environment Level of Independence: Independent        Comments: Pt is independent in B/ADL completion, meal preparation, medication management; does not drive.         OT Problem List: Cardiopulmonary status limiting activity    AM-PAC PT "6 Clicks" Daily Activity     Outcome Measure Help from another person eating meals?: None Help from another person taking care of personal grooming?:  None Help from another person toileting, which includes using toliet, bedpan, or urinal?: None Help from another person bathing (including washing, rinsing, drying)?: None Help from another person to put on and taking off regular upper body clothing?: None Help from another person to put on and taking off regular lower body clothing?: None 6 Click Score: 24   End of Session Equipment Utilized During Treatment: Oxygen  Activity Tolerance: Patient tolerated treatment well Patient left: in bed;with call bell/phone within reach  OT Visit Diagnosis: Muscle weakness (generalized) (M62.81)                Time: 2376-2831 OT Time Calculation (min): 29 min Charges:  OT General Charges $OT Visit: 1 Visit OT Evaluation $OT Eval Low Complexity: 1 Low OT Treatments $Self Care/Home Management : 8-22 mins G-Codes: OT G-codes **NOT FOR INPATIENT CLASS** Functional Assessment Tool Used: AM-PAC 6 Clicks Daily Activity Functional Limitation: Self care Self Care Current Status (D1761): 0 percent impaired, limited or restricted Self Care Goal Status (Y0737): 0 percent impaired, limited or restricted Self Care Discharge Status (T0626): 0 percent impaired, limited or restricted    Guadelupe Sabin, OTR/L  [phone] 09/06/2017, 8:12 AM

## 2017-09-07 DIAGNOSIS — I3139 Other pericardial effusion (noninflammatory): Secondary | ICD-10-CM | POA: Diagnosis present

## 2017-09-07 DIAGNOSIS — E118 Type 2 diabetes mellitus with unspecified complications: Secondary | ICD-10-CM

## 2017-09-07 DIAGNOSIS — N179 Acute kidney failure, unspecified: Secondary | ICD-10-CM | POA: Diagnosis not present

## 2017-09-07 DIAGNOSIS — R778 Other specified abnormalities of plasma proteins: Secondary | ICD-10-CM | POA: Diagnosis present

## 2017-09-07 DIAGNOSIS — R748 Abnormal levels of other serum enzymes: Secondary | ICD-10-CM

## 2017-09-07 DIAGNOSIS — E1165 Type 2 diabetes mellitus with hyperglycemia: Secondary | ICD-10-CM

## 2017-09-07 DIAGNOSIS — E038 Other specified hypothyroidism: Secondary | ICD-10-CM

## 2017-09-07 DIAGNOSIS — I313 Pericardial effusion (noninflammatory): Secondary | ICD-10-CM

## 2017-09-07 DIAGNOSIS — R7989 Other specified abnormal findings of blood chemistry: Secondary | ICD-10-CM | POA: Diagnosis present

## 2017-09-07 LAB — BASIC METABOLIC PANEL
ANION GAP: 11 (ref 5–15)
BUN: 34 mg/dL — ABNORMAL HIGH (ref 6–20)
CALCIUM: 8.6 mg/dL — AB (ref 8.9–10.3)
CO2: 29 mmol/L (ref 22–32)
CREATININE: 1.57 mg/dL — AB (ref 0.44–1.00)
Chloride: 101 mmol/L (ref 101–111)
GFR, EST AFRICAN AMERICAN: 41 mL/min — AB (ref >60)
GFR, EST NON AFRICAN AMERICAN: 35 mL/min — AB (ref >60)
Glucose, Bld: 154 mg/dL — ABNORMAL HIGH (ref 65–99)
Potassium: 4.1 mmol/L (ref 3.5–5.1)
Sodium: 141 mmol/L (ref 135–145)

## 2017-09-07 LAB — GLUCOSE, CAPILLARY
GLUCOSE-CAPILLARY: 151 mg/dL — AB (ref 65–99)
Glucose-Capillary: 171 mg/dL — ABNORMAL HIGH (ref 65–99)
Glucose-Capillary: 210 mg/dL — ABNORMAL HIGH (ref 65–99)
Glucose-Capillary: 130 mg/dL — ABNORMAL HIGH (ref 65–99)

## 2017-09-07 LAB — SEDIMENTATION RATE: SED RATE: 35 mm/h — AB (ref 0–22)

## 2017-09-07 LAB — HIV ANTIBODY (ROUTINE TESTING W REFLEX): HIV Screen 4th Generation wRfx: NONREACTIVE

## 2017-09-07 MED ORDER — SENNOSIDES-DOCUSATE SODIUM 8.6-50 MG PO TABS
1.0000 | ORAL_TABLET | Freq: Every day | ORAL | Status: DC
  Administered 2017-09-07 – 2017-09-12 (×5): 1 via ORAL
  Filled 2017-09-07 (×7): qty 1

## 2017-09-07 MED ORDER — LEVOTHYROXINE SODIUM 75 MCG PO TABS
225.0000 ug | ORAL_TABLET | Freq: Every day | ORAL | Status: DC
  Administered 2017-09-08 – 2017-09-14 (×6): 225 ug via ORAL
  Filled 2017-09-07 (×7): qty 3

## 2017-09-07 MED ORDER — CARVEDILOL 6.25 MG PO TABS
6.2500 mg | ORAL_TABLET | Freq: Two times a day (BID) | ORAL | Status: DC
  Administered 2017-09-07 – 2017-09-09 (×5): 6.25 mg via ORAL
  Filled 2017-09-07: qty 2
  Filled 2017-09-07 (×3): qty 1

## 2017-09-07 NOTE — Progress Notes (Signed)
Progress Note  Patient Name: Maureen Jackson Date of Encounter: 09/07/2017   Subjective   SOB improving but not resolved  Inpatient Medications    Scheduled Meds: . enoxaparin (LOVENOX) injection  40 mg Subcutaneous Q24H  . furosemide  40 mg Intravenous Q12H  . insulin aspart  0-20 Units Subcutaneous TID WC  . insulin aspart  0-5 Units Subcutaneous QHS  . labetalol  300 mg Oral BID  . levothyroxine  200 mcg Oral QAC breakfast  . losartan  100 mg Oral Daily  . mouth rinse  15 mL Mouth Rinse BID  . polyethylene glycol  17 g Oral Daily  . simvastatin  10 mg Oral QPM  . sodium chloride flush  3 mL Intravenous Q12H   Continuous Infusions: . sodium chloride     PRN Meds: sodium chloride, acetaminophen, hydrALAZINE, magnesium hydroxide, ondansetron (ZOFRAN) IV, sodium chloride flush   Vital Signs    Vitals:   09/06/17 1047 09/06/17 1548 09/06/17 2100 09/07/17 0531  BP: (!) 160/84 123/70 102/67 121/64  Pulse: 79 75 78 73  Resp: _0 Temp:  98.7 F (37.1 C) 98.8 F (37.1 C) 97.7 F (36.5 C)  TempSrc:  Oral Oral Oral  SpO2: 99% 98% 97% 100%  Weight:    (!) 310 lb 11.2 oz (140.9 kg)  Height:        Intake/Output Summary (Last 24 hours) at 09/07/17 0839 Last data filed at 09/06/17 2133  Gross per 24 hour  Intake              240 ml  Output             1600 ml  Net            -1360 ml   Filed Weights   09/05/17 2321 09/06/17 0630 09/07/17 0531  Weight: (!) 309 lb 1.6 oz (140.2 kg) (!) 308 lb 4.8 oz (139.8 kg) (!) 310 lb 11.2 oz (140.9 kg)    Telemetry    SR LBBB - Personally Reviewed  ECG     Physical Exam   GEN: No acute distress.   Neck: No JVD Cardiac: RRR, no murmurs, rubs, or gallops.  Respiratory: Clear to auscultation bilaterally. GI: Soft, nontender, non-distended  MS: No edema; No deformity. Neuro:  Nonfocal  Psych: Normal affect   Labs    Chemistry Recent Labs Lab 09/05/17 2012 09/06/17 0201 09/07/17 0633  NA 138 138 141    K 3.9 3.5 4.1  CL 103 102 101  CO2 _1 GLUCOSE 154* 192* 154*  BUN 16 17 34*  CREATININE 1.09* 1.15* 1.57*  CALCIUM 8.8* 8.5* 8.6*  PROT 8.3*  --   --   ALBUMIN 3.7  --   --   AST 36  --   --   ALT 34  --   --   ALKPHOS 151*  --   --   BILITOT 0.9  --   --   GFRNONAA 54* 51* 35*  GFRAA >60 59* 41*  ANIONGAP _2 Hematology Recent Labs Lab 09/05/17 2012 09/06/17 0201  WBC 7.1 7.6  RBC 4.72 4.38  HGB 12.3 11.8*  HCT 38.7 36.0  MCV 82.0 82.2  MCH 26.1 26.9  MCHC 31.8 32.8  RDW 14.0 14.0  PLT 229 223    Cardiac Enzymes Recent Labs Lab 09/05/17 2012 09/06/17 0201 09/06/17 0841 09/06/17 1116  TROPONINI 0.05* 0.08* 0.07* 0.06*  No results for input(s): TROPIPOC in the last 168 hours.   BNP Recent Labs Lab 09/05/17 2225  BNP 386.0*     DDimer  Recent Labs Lab 09/05/17 2012  DDIMER 0.37     Radiology    Dg Chest Port 1 View  Result Date: 09/05/2017 CLINICAL DATA:  One-week history of cough. Acute onset of shortness of breath and chills that began this evening. Current history of diabetes and hypertension. EXAM: PORTABLE CHEST 1 VIEW COMPARISON:  03/08/2010, 05/07/2009 and earlier. FINDINGS: Massive cardiac enlargement, significantly increased in size since 2011. Pulmonary venous hypertension and mild diffuse interstitial pulmonary edema. No visible pleural effusions. IMPRESSION: Mild CHF, with massive cardiomegaly and mild diffuse interstitial pulmonary edema. The heart has significantly increased in size since 2011. Electronically Signed   By: Evangeline Dakin M.D.   On: 09/05/2017 20:31    Cardiac Studies     Patient Profile     Maureen Jackson is a 61 y.o.female history of OSA not on cpap, obestiry, HTN, DM2, hypothyroidism admitted with SOB. Reports progressing symptoms over the last week. +orthopnea. + LE edema. +palpitaitons. She denies any chest pain. Reports severe DOE just walking to her bathroom at home.   Assessment & Plan     1. Acute systolic HF - new diagnosis of systolic HF this admission, LVEF 30-35%. Presented with fluid overload - negative 1.3 liters yesterday, negative 2.6 liters since admission. Significant uptrend in Cr, hold diuretics today - In setting of systolic dysfunction change labetalol to coreg 6.31m bid. Continue losartan.  - she will need RHC/LHC once renal function improves. With CAD risk factors and LBBB possible ICM is considered. Her hypothyroid may also be playing some role  2. Moderate pericardial effusion - will need f/u limited study.  - could be related to her hypothyroid. We will check ANA, ESR as well   3. OSA - noncompliant with home cpap   4. LBBB - new since 09/2015. Clinical setting not consistent with AMI/acute LBBB. Does raise concern for possible chronic CAD and ICM.   5. Hypothyroidism - per primary team   [facility], cardiology will follow and round over the weekend. Plan for RHC/LHC on Monday pending renal function.   For questions or updates, please contact CR[place]inePlease consult [email or web address] for contact info under Cardiology/STEMI.      SMerrily Pew MD  09/07/2017, 8:39 AM

## 2017-09-07 NOTE — Progress Notes (Signed)
PT Cancellation Note  Patient Details Name: Maureen Jackson MRN: [id number] DOB: [birth year]   Cancelled Treatment:    Reason Eval/Treat Not Completed: Medical issues which prohibited therapy.  [facility] transferring [facility] secondary to medical complications.   3:25 PM, 09/07/17 Lonell Grandchild, MPT Physical Therapist with [facility] [phone] office [phone] mobile phone

## 2017-09-07 NOTE — Progress Notes (Signed)
PROGRESS NOTE    Maureen Jackson  MWU:[id number] DOB: Jan 15, 1956 DOA: 09/05/2017 PCP: Patient, No Pcp Per    Brief Narrative:  Patient is a 60 year old woman with a history of OSA-noncompliant to CPAP, obesity, type 2 DM, and hypertension, who presented to the ED on 09/05/2017 with a complaint of shortness of breath. In the ED, she was afebrile and hemodynamically stable but hypertensive and oxygenating 90% on room air. Her lab data were significant for troponin I of 0.05, BNP of 386, and a chest x-ray that showed CHF with massive cardiomegaly and mild diffuse interstitial pulmonary edema. She was admitted for further evaluation and management.  Patient was found to have an EF of 30-35% and a moderate pericardial effusion. Cardiology recommended transfer to [facility] for a definitive diagnosis with RHC/LHC which will be tentatively scheduled for 09/10/17. Patient will be transferred to the hospitalist service at [facility] where cardiology will consult.    Assessment & Plan:   Principal Problem:   Acute combined systolic (congestive) and diastolic (congestive) heart failure (HCC) Active Problems:   Pericardial effusion   Elevated troponin I level   Hypothyroidism   Essential hypertension   Sleep apnea   Morbid obesity ([place])   Diabetes mellitus type 2 with complications, uncontrolled ([place])   AKI (acute kidney injury) ([place])   1. Newly diagnosed acute systolic CHF. Patient was started on IV Lasix. Hyzaar and labetalol were continued for treatment of hypertension and mild tachycardia. -2-D echocardiogram was ordered for evaluation as her EF was unknown. -The echo revealed an EF of 72-53%; grade 1 diastolic dysfunction; septal motion with dyssynergy, and LBBB; and a moderate sized pericardial effusion. -Cardiology was consulted. Dr. Harl Bowie changed to labetalol to Coreg and discontinued HCTZ as the patient's creatinine was trending up. -Due to the findings on the echo, Dr. Harl Bowie recommended transfer to  [facility] for definitive diagnosis- RHC/LHC once her kidney function improves. He went on to say that with her CAD risk factors and LBBB, possible ICM should be considered. -Patient is diuresed -2.6 L so far on IV Lasix 40 mg twice a day. -Due to her worsening renal function, Dr. Harl Bowie has ordered a hold on IV Lasix for today.  Elevated troponin I. The the patient's troponin I was modestly elevated on admission. It has remained flat. -The elevation does not appear to be consistent with ACS.  Moderate pericardial effusion. -This was noted on the 2-D echocardiogram. -Etiology is unclear, but could be related to her chronic hypothyroidism. -ANA and ESR ordered and is pending. -No rubs heard on exam.  Hypothyroidism:  Patient is treated chronically with Synthroid. She was unable to afford many of her medications and only consistently got back on them for months ago. Her previous TSH was at 84 two years ago. She has been on her current dose of Synthroid 200 g for the past 4 months consistently. -Her current TSH is at 14. Free T4 is 0.82. -There is room to increase her Synthroid, so we'll increase it to 225 g daily. Recommend follow-up TSH and free T4 in 3 months.  Hypertension. Patient is treated chronically with losartan/HCTZ, furosemide, and labetalol. -HCTZ was discontinued in the setting of IV Lasix and increase in creatinine. -Losartan and labetalol were continued.  AKI The patient's renal function/creatinine was 1.09 on admission. It has steadily increased to 1.57 today. -This is likely from prerenal azotemia from IV diuretics. -Per cardiology, Lasix will be held today. Continue to monitor.  Type 2 diabetes mellitus. Patient is  treated chronically with Amaryl and metformin. Both were withheld. -Sliding-scale NovoLog ordered. Her CBGs have been reasonable. Her hemoglobin A1c is 7.1. -Her blood pressure which have been elevated on admission has normalized. Continue to  monitor.  OSA. Patient is noncompliant with CPAP.  Constipation. -We'll order soapsuds enema and continue MiraLAX. Will add Senokot-S daily at bedtime.    DVT prophylaxis: Lovenox Code Status: Full code Family Communication: Discussed with cousin Disposition Plan: Transferred to [facility] hospitalist service with cardiology to consult; plan cardiac cath 09/10/17   Consultants:   Cardiology  Procedures:  2-D echo 09/06/17:  Study Conclusions - Procedure narrative: Transthoracic echocardiography. Image   quality was fair. The study was technically difficult, as a   result of poor sound wave transmission. Intravenous contrast   (Definity) was administered. - Left ventricle: The cavity size was mildly dilated. Systolic   function was moderately to severely reduced. The estimated   ejection fraction was in the range of 30% to 35%. Diffuse   hypokinesis. Doppler parameters are consistent with abnormal left   ventricular relaxation (grade 1 diastolic dysfunction). Mild   concentric and moderate focal basal septal hypertrophy. - Ventricular septum: Septal motion showed abnormal function and   dyssynergy. These changes are consistent with a left bundle   branch block. - Aortic valve: Mildly calcified annulus. - Systemic veins: IVC is dilated with normal respiratory variation.   It was suboptimally visualized. Estimated right atrial pressure:   8 mmHg. - Pericardium, extracardiac: Moderate size pericardial effusion.   There is some right atrial inversion noted, but no frank    tamponade physiology.  Antimicrobials:  None    Subjective: Patient says "I feel miserable" as she reports not having a bowel movement for more than one week. She denies abdominal pain nausea/vomiting. Nursing all for her an enema yesterday, but she refused. She denies chest pain or shortness of breath.  Objective: Vitals:   09/06/17 1047 09/06/17 1548 09/06/17 2100 09/07/17 0531  BP: (!) 160/84 123/70  102/67 121/64  Pulse: 79 75 78 73  Resp: _0 Temp:  98.7 F (37.1 C) 98.8 F (37.1 C) 97.7 F (36.5 C)  TempSrc:  Oral Oral Oral  SpO2: 99% 98% 97% 100%  Weight:    (!) 140.9 kg (310 lb 11.2 oz)  Height:        Intake/Output Summary (Last 24 hours) at 09/07/17 8676 Last data filed at 09/07/17 0900  Gross per 24 hour  Intake              480 ml  Output              700 ml  Net             -220 ml   Filed Weights   09/05/17 2321 09/06/17 0630 09/07/17 0531  Weight: (!) 140.2 kg (309 lb 1.6 oz) (!) 139.8 kg (308 lb 4.8 oz) (!) 140.9 kg (310 lb 11.2 oz)    Examination:  General exam: Obese African-American woman who appears uncomfortable (she reports constipation 1 week).  Respiratory system: Clear to auscultation. Respiratory effort normal. Cardiovascular system: S1 & S2 with a soft systolic murmur. No rub auscultated. Trace bilateral lower extremity pedal edema. Gastrointestinal system: Abdomen is obese, nondistended, soft and nontender. No organomegaly or masses felt. Normal bowel sounds heard. Central nervous system: Alert and oriented. No focal neurological deficits. Extremities: Symmetric 5 x 5 power. Skin: No rashes, lesions or ulcers Psychiatry: Judgement and insight  appear normal. Mood & affect appropriate.     Data Reviewed: I have personally reviewed following labs and imaging studies  CBC:  Recent Labs Lab 09/05/17 2012 09/06/17 0201  WBC 7.1 7.6  NEUTROABS 3.5 4.0  HGB 12.3 11.8*  HCT 38.7 36.0  MCV 82.0 82.2  PLT 229 696   Basic Metabolic Panel:  Recent Labs Lab 09/05/17 2012 09/06/17 0201 09/07/17 0633  NA 138 138 141  K 3.9 3.5 4.1  CL 103 102 101  CO2 _0 GLUCOSE 154* 192* 154*  BUN 16 17 34*  CREATININE 1.09* 1.15* 1.57*  CALCIUM 8.8* 8.5* 8.6*   GFR: Estimated Creatinine Clearance: 55.2 mL/min (A) (by C-G formula based on SCr of 1.57 mg/dL (H)). Liver Function Tests:  Recent Labs Lab 09/05/17 2012  AST 36  ALT  34  ALKPHOS 151*  BILITOT 0.9  PROT 8.3*  ALBUMIN 3.7   No results for input(s): LIPASE, AMYLASE in the last 168 hours. No results for input(s): AMMONIA in the last 168 hours. Coagulation Profile: No results for input(s): INR, PROTIME in the last 168 hours. Cardiac Enzymes:  Recent Labs Lab 09/05/17 2012 09/06/17 0201 09/06/17 0841 09/06/17 1116  TROPONINI 0.05* 0.08* 0.07* 0.06*   BNP (last 3 results) No results for input(s): PROBNP in the last 8760 hours. HbA1C:  Recent Labs  09/06/17 0201  HGBA1C 7.1*   CBG:  Recent Labs Lab 09/06/17 0739 09/06/17 1211 09/06/17 1658 09/06/17 2054 09/07/17 0733  GLUCAP 137* 148* 168* 129* 171*   Lipid Profile: No results for input(s): CHOL, HDL, LDLCALC, TRIG, CHOLHDL, LDLDIRECT in the last 72 hours. Thyroid Function Tests:  Recent Labs  09/05/17 2043 09/06/17 0201  TSH 14.361*  --   FREET4  --  0.82   Anemia Panel: No results for input(s): VITAMINB12, FOLATE, FERRITIN, TIBC, IRON, RETICCTPCT in the last 72 hours. Sepsis Labs: No results for input(s): PROCALCITON, LATICACIDVEN in the last 168 hours.  No results found for this or any previous visit (from the past 240 hour(s)).       Radiology Studies: Dg Chest Port 1 View  Result Date: 09/05/2017 CLINICAL DATA:  One-week history of cough. Acute onset of shortness of breath and chills that began this evening. Current history of diabetes and hypertension. EXAM: PORTABLE CHEST 1 VIEW COMPARISON:  03/08/2010, 05/07/2009 and earlier. FINDINGS: Massive cardiac enlargement, significantly increased in size since 2011. Pulmonary venous hypertension and mild diffuse interstitial pulmonary edema. No visible pleural effusions. IMPRESSION: Mild CHF, with massive cardiomegaly and mild diffuse interstitial pulmonary edema. The heart has significantly increased in size since 2011. Electronically Signed   By: Evangeline Dakin M.D.   On: 09/05/2017 20:31        Scheduled  Meds: . carvedilol  6.25 mg Oral BID WC  . enoxaparin (LOVENOX) injection  40 mg Subcutaneous Q24H  . insulin aspart  0-20 Units Subcutaneous TID WC  . insulin aspart  0-5 Units Subcutaneous QHS  . levothyroxine  200 mcg Oral QAC breakfast  . losartan  100 mg Oral Daily  . mouth rinse  15 mL Mouth Rinse BID  . polyethylene glycol  17 g Oral Daily  . simvastatin  10 mg Oral QPM  . sodium chloride flush  3 mL Intravenous Q12H   Continuous Infusions: . sodium chloride       LOS: 1 day    Time spent: 35 minutes    Rexene Alberts, MD [facility] Pager [phone]  If 7PM-7AM, please  contact night-coverage [email or web address] Password [facility] 09/07/2017, 9:28 AM

## 2017-09-08 DIAGNOSIS — I5033 Acute on chronic diastolic (congestive) heart failure: Secondary | ICD-10-CM

## 2017-09-08 LAB — GLUCOSE, CAPILLARY
GLUCOSE-CAPILLARY: 167 mg/dL — AB (ref 65–99)
Glucose-Capillary: 150 mg/dL — ABNORMAL HIGH (ref 65–99)
Glucose-Capillary: 149 mg/dL — ABNORMAL HIGH (ref 65–99)
Glucose-Capillary: 172 mg/dL — ABNORMAL HIGH (ref 65–99)

## 2017-09-08 LAB — ANA W/REFLEX IF POSITIVE: Anti Nuclear Antibody(ANA): NEGATIVE

## 2017-09-08 MED ORDER — ALUM & MAG HYDROXIDE-SIMETH 200-200-20 MG/5ML PO SUSP
30.0000 mL | Freq: Four times a day (QID) | ORAL | Status: DC | PRN
  Administered 2017-09-08 – 2017-09-09 (×4): 30 mL via ORAL
  Filled 2017-09-08 (×4): qty 30

## 2017-09-08 NOTE — Progress Notes (Signed)
Progress Note  Patient Name: Maureen Jackson Date of Encounter: 09/08/2017   Subjective   SOB improving.   Inpatient Medications    Scheduled Meds: . carvedilol  6.25 mg Oral BID WC  . enoxaparin (LOVENOX) injection  40 mg Subcutaneous Q24H  . insulin aspart  0-20 Units Subcutaneous TID WC  . insulin aspart  0-5 Units Subcutaneous QHS  . levothyroxine  225 mcg Oral QAC breakfast  . losartan  100 mg Oral Daily  . mouth rinse  15 mL Mouth Rinse BID  . polyethylene glycol  17 g Oral Daily  . senna-docusate  1 tablet Oral QHS  . simvastatin  10 mg Oral QPM  . sodium chloride flush  3 mL Intravenous Q12H   Continuous Infusions: . sodium chloride     PRN Meds: sodium chloride, acetaminophen, alum & mag hydroxide-simeth, hydrALAZINE, magnesium hydroxide, ondansetron (ZOFRAN) IV, sodium chloride flush   Vital Signs    Vitals:   09/07/17 1500 09/07/17 2022 09/08/17 0104 09/08/17 0657  BP: 137/75 116/62 (!) 142/69 131/83  Pulse: 75 83 79 81  Resp: 18 18 18 18   Temp: 98.7 F (37.1 C) 98.4 F (36.9 C) 98.1 F (36.7 C) 98.2 F (36.8 C)  TempSrc: Oral Oral Oral Oral  SpO2: 100% 95% 95% 94%  Weight:   (!) 311 lb 9.6 oz (141.3 kg)   Height:   5' 5"  (1.651 m)     Intake/Output Summary (Last 24 hours) at 09/08/17 1010 Last data filed at 09/08/17 0932  Gross per 24 hour  Intake              720 ml  Output                0 ml  Net              720 ml   Filed Weights   09/06/17 0630 09/07/17 0531 09/08/17 0104  Weight: (!) 308 lb 4.8 oz (139.8 kg) (!) 310 lb 11.2 oz (140.9 kg) (!) 311 lb 9.6 oz (141.3 kg)    Telemetry    SR LBBB - Personally Reviewed  ECG    n/a  Physical Exam   GEN: No acute distress.   Neck: No JVD Cardiac: RRR, no murmurs, rubs, or gallops.  Respiratory: Clear to auscultation bilaterally. GI: Soft, nontender, non-distended  MS: No edema; No deformity. Neuro:  Nonfocal  Psych: Normal affect   Labs    Chemistry Recent Labs Lab  09/05/17 2012 09/06/17 0201 09/07/17 0633  NA 138 138 141  K 3.9 3.5 4.1  CL 103 102 101  CO2 25 26 29   GLUCOSE 154* 192* 154*  BUN 16 17 34*  CREATININE 1.09* 1.15* 1.57*  CALCIUM 8.8* 8.5* 8.6*  PROT 8.3*  --   --   ALBUMIN 3.7  --   --   AST 36  --   --   ALT 34  --   --   ALKPHOS 151*  --   --   BILITOT 0.9  --   --   GFRNONAA 54* 51* 35*  GFRAA >60 59* 41*  ANIONGAP 10 10 11      Hematology Recent Labs Lab 09/05/17 2012 09/06/17 0201  WBC 7.1 7.6  RBC 4.72 4.38  HGB 12.3 11.8*  HCT 38.7 36.0  MCV 82.0 82.2  MCH 26.1 26.9  MCHC 31.8 32.8  RDW 14.0 14.0  PLT 229 223    Cardiac Enzymes Recent Labs Lab 09/05/17  2012 09/06/17 0201 09/06/17 0841 09/06/17 1116  TROPONINI 0.05* 0.08* 0.07* 0.06*   No results for input(s): TROPIPOC in the last 168 hours.   BNP Recent Labs Lab 09/05/17 2225  BNP 386.0*     DDimer  Recent Labs Lab 09/05/17 2012  DDIMER 0.37     Radiology    No results found.  Cardiac Studies     Patient Profile     Maureen Jackson a 61 y.o.femalehistory of OSA not on cpap, obestiry, HTN, DM2, hypothyroidism admitted with SOB. Reports progressing symptoms over the last week. +orthopnea. + LE edema. +palpitaitons. She denies any chest pain. Reports severe DOE just walking to her bathroom at home.   Assessment & Plan    1. Acute systolic HF - new diagnosis of systolic HF this admission, LVEF 30-35%. Presented with fluid overload -I/Os are incomplete from yesterday. She received lasix 6m IV x 1 yesterday, further diuretics held due to elevation of Cr. Labs are pending this AM, continue to hold diuretics until resulted. Limited exam due to body habitus, her edema has significantly improved. Filling pressures by RHC will help clarify volume status - medical therapy with coreg 6.260mbid and losartan 10013maily currently. Just changed to coreg, likely titrate tomorrow pending vitals.  - plan for RHC/LHC on Monday pending renal  function. - With CAD risk factors and LBBB possible ICM is considered. Her hypothyroid may also be playing some role  2. Moderate pericardial effusion - repeat limited echo Monday - may be related to hypothyroid - ANA is pending, ESR is elevated  3. OSA - will need to be setup for repeat evaluation as outpatient. She reports prior diagnosis but does not have cpap  4. LBBB - new since 09/2015. Clinical setting not consistent with AMI/acute LBBB. Does raise concern for possible chronic CAD and ICM.   5. Hypothyroidism - per primary team, synthroid was increased to 225 this admit   For questions or updates, please contact [place] consult [email or web address] for contact info under Cardiology/STEMI.      SigMerrily PewD  09/08/2017, 10:10 AM

## 2017-09-08 NOTE — Progress Notes (Signed)
PROGRESS NOTE    Maureen Jackson  [id number]:[id number] DOB: Aug 08, 1956 DOA: 09/05/2017 PCP: Patient, No Pcp Per    Brief Narrative:  61 year old female who presented with dyspnea. Patient does have a significant past medical history of morbid obesity, hypothyroidism, ulcers, hypertension, and diabetes mellitus type 2. Patient presents with worsening dyspnea over last 7 days, dyspneic with minimal efforts, associated with lower extremity edema. On initial physical examination blood pressure 194/95, 226/104, heart rate 104, respiratory 22, oxygen saturation 96%. Lungs with decreased breath sounds bilaterally, increased respiratory effort, heart S1-S2 present and rhythmic, no gallops, rubs or murmurs, the abdomen, protuberant, nontender, nondistended, lower extremities with no edema. Sodium 138, potassium 3.9, chloride 103, bicarbonate 25, glucose 154, BUN 16, creatinine 1.09, troponin 0.05, white count 7.1, hemoglobin 12.3, hematocrit 30.7, platelets 229. Chest x-ray with increased lung markings bilaterally, cardiomegaly. EKG sinus rhythm, left bundle branch block, septal Q waves, V1, V2 and V3.   Patient was admitted with a working diagnosis of decompensated heart failure. Further workup with echocardiography showed ejection fraction of 30-35% with a moderate pericardial effusion.   Assessment & Plan:   Principal Problem:   Acute combined systolic (congestive) and diastolic (congestive) heart failure (HCC) Active Problems:   Hypothyroidism   Essential hypertension   Sleep apnea   Morbid obesity ([place])   Diabetes mellitus type 2 with complications, uncontrolled (HCC)   Pericardial effusion   AKI (acute kidney injury) ([place])   Elevated troponin I level   1. Decompensated heart failure, systolic, acute on chronic. Continue heart failure regimen with carvedilol at 25 mg po bid, 100 mg of losartan and statin therapy. Patient off diuretics at this point, with plans for cardiac catheterization on 10/01.  Out of bed as tolerated and physical therapy evaluation.   2. Hypothyroidism. Continue levothyroxine, per home regimen  3. Hypertension. Continue blood pressure control with carvedilol  4. Acute kidney injury. Renal function with serum creatinine up to 1,57 with serum K at 4,1 and bicarbonate at 29, suspected contraction alkalosis. Will continue to hold on diuretics for now. Avoid hypotension, and will follow renal panel in a m.   5. Type 2 diabetes mellitus. Will continue glucose cover and monitoring with sliding scale, patient tolerating po well. Capillary glucose 210, 130, 151, 150, 167.   6. Sleep apnea and morbid obesity. Will need sleep study and program of weight reduction as outpatient.   DVT prophylaxis: enoxaparin  Code Status: full code Family Communication:  Disposition Plan:    Consultants:   Cardiology  Procedures:     Antimicrobials:       Subjective: Patient complains of wheezing and generalized weakness, no chest pain or frank dyspnea, improved lower extremity edema.  Objective: Vitals:   09/07/17 1500 09/07/17 2022 09/08/17 0104 09/08/17 0657  BP: 137/75 116/62 (!) 142/69 131/83  Pulse: 75 83 79 81  Resp: 18 18 18 18   Temp: 98.7 F (37.1 C) 98.4 F (36.9 C) 98.1 F (36.7 C) 98.2 F (36.8 C)  TempSrc: Oral Oral Oral Oral  SpO2: 100% 95% 95% 94%  Weight:   (!) 141.3 kg (311 lb 9.6 oz)   Height:   5\' 5"  (1.651 m)     Intake/Output Summary (Last 24 hours) at 09/08/17 0923 Last data filed at 09/08/17 0533  Gross per 24 hour  Intake              480 ml  Output  0 ml  Net              480 ml   Filed Weights   09/06/17 0630 09/07/17 0531 09/08/17 0104  Weight: (!) 139.8 kg (308 lb 4.8 oz) (!) 140.9 kg (310 lb 11.2 oz) (!) 141.3 kg (311 lb 9.6 oz)    Examination:  General: Not in pain or dyspnea, deconditioned Neurology: Awake and alert, non focal  E ENT: no pallor, no icterus, oral mucosa moist Cardiovascular: No JVD. S1-S2  present, rhythmic, no gallops, rubs, or murmurs. Trace lower extremity edema. Pulmonary: vesicular breath sounds bilaterally at bases, adequate air movement, no wheezing, rhonchi or rales. Gastrointestinal. Abdomen protuberant, no organomegaly, non tender, no rebound or guarding Skin. No rashes Musculoskeletal: no joint deformities     Data Reviewed: I have personally reviewed following labs and imaging studies  CBC:  Recent Labs Lab 09/05/17 2012 09/06/17 0201  WBC 7.1 7.6  NEUTROABS 3.5 4.0  HGB 12.3 11.8*  HCT 38.7 36.0  MCV 82.0 82.2  PLT 229 712   Basic Metabolic Panel:  Recent Labs Lab 09/05/17 2012 09/06/17 0201 09/07/17 0633  NA 138 138 141  K 3.9 3.5 4.1  CL 103 102 101  CO2 25 26 29   GLUCOSE 154* 192* 154*  BUN 16 17 34*  CREATININE 1.09* 1.15* 1.57*  CALCIUM 8.8* 8.5* 8.6*   GFR: Estimated Creatinine Clearance: 55.2 mL/min (A) (by C-G formula based on SCr of 1.57 mg/dL (H)). Liver Function Tests:  Recent Labs Lab 09/05/17 2012  AST 36  ALT 34  ALKPHOS 151*  BILITOT 0.9  PROT 8.3*  ALBUMIN 3.7   No results for input(s): LIPASE, AMYLASE in the last 168 hours. No results for input(s): AMMONIA in the last 168 hours. Coagulation Profile: No results for input(s): INR, PROTIME in the last 168 hours. Cardiac Enzymes:  Recent Labs Lab 09/05/17 2012 09/06/17 0201 09/06/17 0841 09/06/17 1116  TROPONINI 0.05* 0.08* 0.07* 0.06*   BNP (last 3 results) No results for input(s): PROBNP in the last 8760 hours. HbA1C:  Recent Labs  09/06/17 0201  HGBA1C 7.1*   CBG:  Recent Labs Lab 09/07/17 0733 09/07/17 1119 09/07/17 1625 09/07/17 2021 09/08/17 0733  GLUCAP 171* 210* 130* 151* 150*   Lipid Profile: No results for input(s): CHOL, HDL, LDLCALC, TRIG, CHOLHDL, LDLDIRECT in the last 72 hours. Thyroid Function Tests:  Recent Labs  09/05/17 2043 09/06/17 0201  TSH 14.361*  --   FREET4  --  0.82   Anemia Panel: No results for  input(s): VITAMINB12, FOLATE, FERRITIN, TIBC, IRON, RETICCTPCT in the last 72 hours.    Radiology Studies: I have reviewed all of the imaging during this hospital visit personally     Scheduled Meds: . carvedilol  6.25 mg Oral BID WC  . enoxaparin (LOVENOX) injection  40 mg Subcutaneous Q24H  . insulin aspart  0-20 Units Subcutaneous TID WC  . insulin aspart  0-5 Units Subcutaneous QHS  . levothyroxine  225 mcg Oral QAC breakfast  . losartan  100 mg Oral Daily  . mouth rinse  15 mL Mouth Rinse BID  . polyethylene glycol  17 g Oral Daily  . senna-docusate  1 tablet Oral QHS  . simvastatin  10 mg Oral QPM  . sodium chloride flush  3 mL Intravenous Q12H   Continuous Infusions: . sodium chloride       LOS: 2 days        [place] Gerome Apley, MD [facility]  Pager [phone]

## 2017-09-09 DIAGNOSIS — I509 Heart failure, unspecified: Secondary | ICD-10-CM

## 2017-09-09 LAB — GLUCOSE, CAPILLARY
GLUCOSE-CAPILLARY: 128 mg/dL — AB (ref 65–99)
Glucose-Capillary: 188 mg/dL — ABNORMAL HIGH (ref 65–99)
Glucose-Capillary: 197 mg/dL — ABNORMAL HIGH (ref 65–99)
Glucose-Capillary: 120 mg/dL — ABNORMAL HIGH (ref 65–99)

## 2017-09-09 LAB — LIPID PANEL
CHOL/HDL RATIO: 3.9 ratio
CHOLESTEROL: 124 mg/dL (ref 0–200)
HDL: 32 mg/dL — AB (ref >40)
LDL CALC: 57 mg/dL (ref 0–99)
TRIGLYCERIDES: 177 mg/dL — AB (ref <150)
VLDL: 35 mg/dL (ref 0–40)

## 2017-09-09 LAB — BASIC METABOLIC PANEL
ANION GAP: 6 (ref 5–15)
BUN: 34 mg/dL — ABNORMAL HIGH (ref 6–20)
CHLORIDE: 100 mmol/L — AB (ref 101–111)
CO2: 31 mmol/L (ref 22–32)
CREATININE: 1.49 mg/dL — AB (ref 0.44–1.00)
Calcium: 8.6 mg/dL — ABNORMAL LOW (ref 8.9–10.3)
GFR calc non Af Amer: 37 mL/min — ABNORMAL LOW (ref >60)
GFR, EST AFRICAN AMERICAN: 43 mL/min — AB (ref >60)
Glucose, Bld: 161 mg/dL — ABNORMAL HIGH (ref 65–99)
POTASSIUM: 4.1 mmol/L (ref 3.5–5.1)
SODIUM: 137 mmol/L (ref 135–145)

## 2017-09-09 MED ORDER — CARVEDILOL 6.25 MG PO TABS
6.2500 mg | ORAL_TABLET | Freq: Two times a day (BID) | ORAL | Status: DC
  Administered 2017-09-09 – 2017-09-14 (×10): 6.25 mg via ORAL
  Filled 2017-09-09 (×10): qty 1

## 2017-09-09 MED ORDER — CARVEDILOL 12.5 MG PO TABS: 12.5000 mg | ORAL_TABLET | Freq: Two times a day (BID) | ORAL | Status: DC

## 2017-09-09 NOTE — Progress Notes (Signed)
Progress Note  Patient Name: Maureen Jackson Date of Encounter: 09/09/2017   Subjective   Generalized fatigue this morning.   Inpatient Medications    Scheduled Meds: . carvedilol  6.25 mg Oral BID WC  . enoxaparin (LOVENOX) injection  40 mg Subcutaneous Q24H  . insulin aspart  0-20 Units Subcutaneous TID WC  . insulin aspart  0-5 Units Subcutaneous QHS  . levothyroxine  225 mcg Oral QAC breakfast  . losartan  100 mg Oral Daily  . mouth rinse  15 mL Mouth Rinse BID  . polyethylene glycol  17 g Oral Daily  . senna-docusate  1 tablet Oral QHS  . simvastatin  10 mg Oral QPM  . sodium chloride flush  3 mL Intravenous Q12H   Continuous Infusions: . sodium chloride     PRN Meds: sodium chloride, acetaminophen, alum & mag hydroxide-simeth, hydrALAZINE, magnesium hydroxide, ondansetron (ZOFRAN) IV, sodium chloride flush   Vital Signs    Vitals:   09/08/17 1626 09/08/17 2048 09/09/17 0035 09/09/17 0606  BP: (!) 129/57 (!) 143/83 (!) 122/53 132/81  Pulse: 77 87 78 80  Resp: 18 18  18   Temp: 98 F (36.7 C) 99.2 F (37.3 C)  98.2 F (36.8 C)  TempSrc: Oral Oral  Oral  SpO2: 94% 93% 97% 98%  Weight:    (!) 313 lb (142 kg)  Height:        Intake/Output Summary (Last 24 hours) at 09/09/17 1115 Last data filed at 09/09/17 0607  Gross per 24 hour  Intake              480 ml  Output             2000 ml  Net            -1520 ml   Filed Weights   09/07/17 0531 09/08/17 0104 09/09/17 0606  Weight: (!) 310 lb 11.2 oz (140.9 kg) (!) 311 lb 9.6 oz (141.3 kg) (!) 313 lb (142 kg)    Telemetry    SR - Personally Reviewed  ECG     Physical Exam   GEN: No acute distress.   Neck: No JVD Cardiac: RRR, no murmurs, rubs, or gallops.  Respiratory: Clear to auscultation bilaterally. GI: Soft, nontender, non-distended  MS: No edema; No deformity. Neuro:  Nonfocal  Psych: Normal affect   Labs    Chemistry Recent Labs Lab 09/05/17 2012 09/06/17 0201 09/07/17 0633  09/09/17 0239  NA 138 138 141 137  K 3.9 3.5 4.1 4.1  CL 103 102 101 100*  CO2 25 26 29 31   GLUCOSE 154* 192* 154* 161*  BUN 16 17 34* 34*  CREATININE 1.09* 1.15* 1.57* 1.49*  CALCIUM 8.8* 8.5* 8.6* 8.6*  PROT 8.3*  --   --   --   ALBUMIN 3.7  --   --   --   AST 36  --   --   --   ALT 34  --   --   --   ALKPHOS 151*  --   --   --   BILITOT 0.9  --   --   --   GFRNONAA 54* 51* 35* 37*  GFRAA >60 59* 41* 43*  ANIONGAP 10 10 11 6      Hematology Recent Labs Lab 09/05/17 2012 09/06/17 0201  WBC 7.1 7.6  RBC 4.72 4.38  HGB 12.3 11.8*  HCT 38.7 36.0  MCV 82.0 82.2  MCH 26.1 26.9  MCHC 31.8  32.8  RDW 14.0 14.0  PLT 229 223    Cardiac Enzymes Recent Labs Lab 09/05/17 2012 09/06/17 0201 09/06/17 0841 09/06/17 1116  TROPONINI 0.05* 0.08* 0.07* 0.06*   No results for input(s): TROPIPOC in the last 168 hours.   BNP Recent Labs Lab 09/05/17 2225  BNP 386.0*     DDimer  Recent Labs Lab 09/05/17 2012  DDIMER 0.37     Radiology    No results found.  Cardiac Studies    Patient Profile  [name] a 61 y.o.femalehistory of OSA not on cpap, obestiry, HTN, DM2, hypothyroidism admitted with SOB. Reports progressing symptoms over the last week. +orthopnea. + LE edema. +palpitaitons. She denies any chest pain. Reports severe DOE just walking to her bathroom at home.   Assessment & Plan    1. Acute systolic HF - new diagnosis of systolic HF this admission, LVEF 30-35%. Presented with fluid overload - diuretics on hold due to Cr elvatin, continue to hold today. Filling pressures by RHC tomorrow will help clarify volume status - medical therapy with coreg 6.42m bid and losartan 1054mdaily currently. Some nonspecific fatigue today, will not titrate beta blocker, follow symptoms may need to lower dose.  - plan for RHC/LHC on Monday  - With CAD risk factors and LBBB possible ICM is considered. Her hypothyroid may also be playing some role  2. Moderate  pericardial effusion - repeat limited echo today - may be related to hypothyroid - ANA is neg, ESR is elevated  3. OSA - will need to be setup for repeat evaluation as outpatient. She reports prior diagnosis but does not have cpap  4. LBBB - new since 09/2015. Clinical setting not consistent with AMI/acute LBBB. Does raise concern for possible chronic CAD and ICM.   5. Hypothyroidism - per primary team, synthroid was increased to 225 this admit   I have reviewed the risks, indications, and alternatives to cardiac catheterization, possible angioplasty, and stenting with the patient. Risks include but are not limited to bleeding, infection, vascular injury, stroke, myocardial infection, arrhythmia, kidney injury, radiation-related injury in the case of prolonged fluoroscopy use, emergency cardiac surgery, and death. The patient understands the risks of serious complication is 1-2 in 105927ith diagnostic cardiac cath and 1-2% or less with angioplasty/stenting.     For questions or updates, please contact [facility] consult [email or web address] for contact info under Cardiology/STEMI.      SiMerrily PewMD  09/09/2017, 11:15 AM

## 2017-09-09 NOTE — Progress Notes (Signed)
PROGRESS NOTE    Maureen Jackson  GQQ:[id number] DOB: February 13, 1956 DOA: 09/05/2017 PCP: Patient, No Pcp Per    Brief Narrative:  61 year old female who presented with dyspnea. Patient does have a significant past medical history of morbid obesity, hypothyroidism, ulcers, hypertension, and diabetes mellitus type 2. Patient presents with worsening dyspnea over last 7 days, dyspneic with minimal efforts, associated with lower extremity edema. On initial physical examination blood pressure 194/95, 226/104, heart rate 104, respiratory 22, oxygen saturation 96%. Lungs with decreased breath sounds bilaterally, increased respiratory effort, heart S1-S2 present and rhythmic, no gallops, rubs or murmurs, the abdomen, protuberant, nontender, nondistended, lower extremities with no edema. Sodium 138, potassium 3.9, chloride 103, bicarbonate 25, glucose 154, BUN 16, creatinine 1.09, troponin 0.05, white count 7.1, hemoglobin 12.3, hematocrit 30.7, platelets 229. Chest x-ray with increased lung markings bilaterally, cardiomegaly. EKG sinus rhythm, left bundle branch block, septal Q waves, V1, V2 and V3.   Patient was admitted with a working diagnosis of decompensated heart failure. Further workup with echocardiography showed ejection fraction of 30-35% with a moderate pericardial effusion.    Assessment & Plan:   Principal Problem:   Acute combined systolic (congestive) and diastolic (congestive) heart failure (HCC) Active Problems:   Hypothyroidism   Essential hypertension   Sleep apnea   Morbid obesity ([place])   Diabetes mellitus type 2 with complications, uncontrolled (HCC)   Pericardial effusion   AKI (acute kidney injury) ([place])   Elevated troponin I level  1. Decompensated heart failure, systolic, acute on chronic. Heart failure regimen with carvedilol at 25 mg po bid, plus 100 mg of losartan and statin therapy. Plan for cardiac catheterization on 10/01. Out of bed as tolerated and physical therapy  evaluation. Volume status improved, will continue to hold on diuretic therapy for now.   2. Hypothyroidism. On levothyroxine, per home regimen  3. Hypertension. Continue  Carvedilol, for blood pressure control, systolic 671 to 245 mmHg.   4. Acute kidney injury. Renal function with serum creatinine down to 1,49 from 1,57 with serum K at 4,1 and bicarbonate at 31, cw contraction alkalosis. Continue to avoid nephrotoxic medications or hypotension.   5. Type 2 diabetes mellitus. Glucose cover and monitoring with sliding scale, capillary glucose 167, 149, 172, 188, 197.   6. Sleep apnea and morbid obesity. Continue supplemental -02 per [place] at night. Out of bed as tolerated, physical therapy.   DVT prophylaxis: enoxaparin  Code Status: full code Family Communication:  Disposition Plan:    Consultants:   Cardiology  Procedures:     Antimicrobials:   Subjective: No chest pain, dyspnea continue to improve, no nausea or vomiting, no abdominal pain. Reports urine with odor. Feeling very weak and deconditioned.   Objective: Vitals:   09/08/17 1626 09/08/17 2048 09/09/17 0035 09/09/17 0606  BP: (!) 129/57 (!) 143/83 (!) 122/53 132/81  Pulse: 77 87 78 80  Resp: 18 18  18   Temp: 98 F (36.7 C) 99.2 F (37.3 C)  98.2 F (36.8 C)  TempSrc: Oral Oral  Oral  SpO2: 94% 93% 97% 98%  Weight:    (!) 142 kg (313 lb)  Height:        Intake/Output Summary (Last 24 hours) at 09/09/17 0833 Last data filed at 09/09/17 8099  Gross per 24 hour  Intake              942 ml  Output             2000 ml  Net            -1058 ml   Filed Weights   09/07/17 0531 09/08/17 0104 09/09/17 0606  Weight: (!) 140.9 kg (310 lb 11.2 oz) (!) 141.3 kg (311 lb 9.6 oz) (!) 142 kg (313 lb)    Examination:  General: Not in pain or dyspnea, deconditioned Neurology: Awake and alert, non focal  E ENT: mild pallor, no icterus, oral mucosa moist Cardiovascular: No JVD. S1-S2 present, rhythmic, no  gallops, rubs, or murmurs. lower extremity edema +/++ non pitting. Pulmonary: vesicular breath sounds bilaterally, adequate air movement, no wheezing, rhonchi or rales/ limited due to body habitus.  Gastrointestinal. Abdomen protuberant, no organomegaly, non tender, no rebound or guarding Skin. No rashes Musculoskeletal: no joint deformities     Data Reviewed: I have personally reviewed following labs and imaging studies  CBC:  Recent Labs Lab 09/05/17 2012 09/06/17 0201  WBC 7.1 7.6  NEUTROABS 3.5 4.0  HGB 12.3 11.8*  HCT 38.7 36.0  MCV 82.0 82.2  PLT 229 527   Basic Metabolic Panel:  Recent Labs Lab 09/05/17 2012 09/06/17 0201 09/07/17 0633 09/09/17 0239  NA 138 138 141 137  K 3.9 3.5 4.1 4.1  CL 103 102 101 100*  CO2 25 26 29 31   GLUCOSE 154* 192* 154* 161*  BUN 16 17 34* 34*  CREATININE 1.09* 1.15* 1.57* 1.49*  CALCIUM 8.8* 8.5* 8.6* 8.6*   GFR: Estimated Creatinine Clearance: 58.4 mL/min (A) (by C-G formula based on SCr of 1.49 mg/dL (H)). Liver Function Tests:  Recent Labs Lab 09/05/17 2012  AST 36  ALT 34  ALKPHOS 151*  BILITOT 0.9  PROT 8.3*  ALBUMIN 3.7   No results for input(s): LIPASE, AMYLASE in the last 168 hours. No results for input(s): AMMONIA in the last 168 hours. Coagulation Profile: No results for input(s): INR, PROTIME in the last 168 hours. Cardiac Enzymes:  Recent Labs Lab 09/05/17 2012 09/06/17 0201 09/06/17 0841 09/06/17 1116  TROPONINI 0.05* 0.08* 0.07* 0.06*   BNP (last 3 results) No results for input(s): PROBNP in the last 8760 hours. HbA1C: No results for input(s): HGBA1C in the last 72 hours. CBG:  Recent Labs Lab 09/08/17 0733 09/08/17 1221 09/08/17 1626 09/08/17 2122 09/09/17 0721  GLUCAP 150* 167* 149* 172* 188*   Lipid Profile:  Recent Labs  09/09/17 0239  CHOL 124  HDL 32*  LDLCALC 57  TRIG 177*  CHOLHDL 3.9   Thyroid Function Tests: No results for input(s): TSH, T4TOTAL, FREET4, T3FREE,  THYROIDAB in the last 72 hours. Anemia Panel: No results for input(s): VITAMINB12, FOLATE, FERRITIN, TIBC, IRON, RETICCTPCT in the last 72 hours.    Radiology Studies: I have reviewed all of the imaging during this hospital visit personally     Scheduled Meds: . carvedilol  6.25 mg Oral BID WC  . enoxaparin (LOVENOX) injection  40 mg Subcutaneous Q24H  . insulin aspart  0-20 Units Subcutaneous TID WC  . insulin aspart  0-5 Units Subcutaneous QHS  . levothyroxine  225 mcg Oral QAC breakfast  . losartan  100 mg Oral Daily  . mouth rinse  15 mL Mouth Rinse BID  . polyethylene glycol  17 g Oral Daily  . senna-docusate  1 tablet Oral QHS  . simvastatin  10 mg Oral QPM  . sodium chloride flush  3 mL Intravenous Q12H   Continuous Infusions: . sodium chloride       LOS: 3 days  Maur[place] Gerome Apley, MD [facility] Pager [phone]

## 2017-09-09 NOTE — Evaluation (Signed)
Physical Therapy Evaluation Patient Details Name: Maureen Jackson MRN: [id number] DOB: 1956-10-04 Today's Date: 09/09/2017   History of Present Illness  Pt is a 61 y.o. female with history of OSA not on cpap, obestiry, HTN, DM2, and hypothyroidism. She was admitted with SOB and acute systolic heart failure.  Clinical Impression  Pt admitted with above diagnosis. Pt currently with functional limitations due to the deficits listed below (see PT Problem List). On eval, pt demo modified independence with bed mobility. Supervision provided for transfers and ambulation in room. Pt declining hallway ambulation this session. Pt education on importance of mobility and need to remain active during hospitalization. Pt agreeable to sitting in recliner. Pt will benefit from skilled PT to increase their independence and safety with mobility to allow discharge to the venue listed below.  PT to follow acutely. No follow up services indicated.     Follow Up Recommendations No PT follow up;Supervision - Intermittent    Equipment Recommendations  None recommended by PT    Recommendations for Other Services       Precautions / Restrictions Precautions Precautions: None Restrictions Weight Bearing Restrictions: No      Mobility  Bed Mobility Overal bed mobility: Modified Independent             General bed mobility comments: +rail, increased time and effort  Transfers Overall transfer level: Needs assistance Equipment used: None Transfers: Sit to/from Stand;Stand Pivot Transfers Sit to Stand: Supervision Stand pivot transfers: Supervision       General transfer comment: supervision for safety, no physical assist  Ambulation/Gait Ambulation/Gait assistance: Supervision Ambulation Distance (Feet): 50 Feet Assistive device: None Gait Pattern/deviations: Step-through pattern;Wide base of support;Decreased stride length Gait velocity: decreased   General Gait Details: 2/4 DOE with  ambulation in room  Stairs            Wheelchair Mobility    Modified Rankin (Stroke Patients Only)       Balance Overall balance assessment: No apparent balance deficits (not formally assessed)                                           Pertinent Vitals/Pain Pain Assessment: No/denies pain    Home Living Family/patient expects to be discharged to:: Private residence Living Arrangements: Alone Available Help at Discharge: Family;Friend(s);Available PRN/intermittently Type of Home: Apartment Home Access: Level entry     Home Layout: One level Home Equipment: Grab bars - tub/shower;Grab bars - toilet      Prior Function Level of Independence: Independent         Comments: Pt is independent in B/ADL completion, meal preparation, medication management; does not drive.      Hand Dominance   Dominant Hand: Right    Extremity/Trunk Assessment   Upper Extremity Assessment Upper Extremity Assessment: Overall WFL for tasks assessed    Lower Extremity Assessment Lower Extremity Assessment: Overall WFL for tasks assessed    Cervical / Trunk Assessment Cervical / Trunk Assessment: Normal  Communication   Communication: No difficulties  Cognition Arousal/Alertness: Awake/alert Behavior During Therapy: WFL for tasks assessed/performed Overall Cognitive Status: Within Functional Limits for tasks assessed                                        General Comments  Exercises     Assessment/Plan    PT Assessment Patient needs continued PT services  PT Problem List Decreased activity tolerance;Decreased mobility;Cardiopulmonary status limiting activity       PT Treatment Interventions Gait training;Stair training;Functional mobility training;Therapeutic activities;Therapeutic exercise;Patient/family education    PT Goals (Current goals can be found in the Care Plan section)  Acute Rehab PT Goals Patient Stated Goal:  home PT Goal Formulation: With patient Time For Goal Achievement: 09/23/17 Potential to Achieve Goals: Good    Frequency Min 3X/week   Barriers to discharge        Co-evaluation               AM-PAC PT "6 Clicks" Daily Activity  Outcome Measure Difficulty turning over in bed (including adjusting bedclothes, sheets and blankets)?: A Little Difficulty moving from lying on back to sitting on the side of the bed? : A Little Difficulty sitting down on and standing up from a chair with arms (e.g., wheelchair, bedside commode, etc,.)?: None Help needed moving to and from a bed to chair (including a wheelchair)?: None Help needed walking in hospital room?: None Help needed climbing 3-5 steps with a railing? : A Little 6 Click Score: 21    End of Session   Activity Tolerance: Patient tolerated treatment well Patient left: in chair;with call bell/phone within reach Nurse Communication: Mobility status PT Visit Diagnosis: Difficulty in walking, not elsewhere classified (R26.2)    Time: 3614-4315 PT Time Calculation (min) (ACUTE ONLY): 14 min   Charges:   PT Evaluation $PT Eval Low Complexity: 1 Low     PT G Codes:        Maureen Jackson, PT  Office # [phone] Pager [phone]   Maureen Jackson 09/09/2017, 2:22 PM

## 2017-09-10 ENCOUNTER — Inpatient Hospital Stay (HOSPITAL_COMMUNITY): Payer: Medicare HMO

## 2017-09-10 ENCOUNTER — Encounter (HOSPITAL_COMMUNITY)
Admission: EM | Disposition: A | Discharge status: Home / Self Care | Payer: Self-pay | Source: Home / Self Care | Attending: Internal Medicine

## 2017-09-10 DIAGNOSIS — I2510 Atherosclerotic heart disease of native coronary artery without angina pectoris: Secondary | ICD-10-CM

## 2017-09-10 DIAGNOSIS — I313 Pericardial effusion (noninflammatory): Secondary | ICD-10-CM

## 2017-09-10 DIAGNOSIS — I517 Cardiomegaly: Secondary | ICD-10-CM

## 2017-09-10 DIAGNOSIS — I25110 Atherosclerotic heart disease of native coronary artery with unstable angina pectoris: Secondary | ICD-10-CM

## 2017-09-10 HISTORY — DX: Atherosclerotic heart disease of native coronary artery without angina pectoris: I25.10

## 2017-09-10 HISTORY — PX: RIGHT/LEFT HEART CATH AND CORONARY ANGIOGRAPHY: CATH118266

## 2017-09-10 LAB — BASIC METABOLIC PANEL
Anion gap: 10 (ref 5–15)
BUN: 35 mg/dL — AB (ref 6–20)
CO2: 25 mmol/L (ref 22–32)
CREATININE: 1.24 mg/dL — AB (ref 0.44–1.00)
Calcium: 8.5 mg/dL — ABNORMAL LOW (ref 8.9–10.3)
Chloride: 100 mmol/L — ABNORMAL LOW (ref 101–111)
GFR calc Af Amer: 54 mL/min — ABNORMAL LOW (ref >60)
GFR, EST NON AFRICAN AMERICAN: 47 mL/min — AB (ref >60)
GLUCOSE: 180 mg/dL — AB (ref 65–99)
Potassium: 4.3 mmol/L (ref 3.5–5.1)
SODIUM: 135 mmol/L (ref 135–145)

## 2017-09-10 LAB — CBC
HCT: 34.3 % — ABNORMAL LOW (ref 36.0–46.0)
HEMATOCRIT: 35.4 % — AB (ref 36.0–46.0)
Hemoglobin: 10.6 g/dL — ABNORMAL LOW (ref 12.0–15.0)
Hemoglobin: 10.8 g/dL — ABNORMAL LOW (ref 12.0–15.0)
MCH: 26 pg (ref 26.0–34.0)
MCH: 25.5 pg — AB (ref 26.0–34.0)
MCHC: 30.9 g/dL (ref 30.0–36.0)
MCHC: 30.5 g/dL (ref 30.0–36.0)
MCV: 84.1 fL (ref 78.0–100.0)
MCV: 83.7 fL (ref 78.0–100.0)
PLATELETS: 218 10*3/uL (ref 150–400)
PLATELETS: 226 10*3/uL (ref 150–400)
RBC: 4.08 MIL/uL (ref 3.87–5.11)
RBC: 4.23 MIL/uL (ref 3.87–5.11)
RDW: 14.6 % (ref 11.5–15.5)
RDW: 14.1 % (ref 11.5–15.5)
WBC: 6.7 10*3/uL (ref 4.0–10.5)
WBC: 6.6 10*3/uL (ref 4.0–10.5)

## 2017-09-10 LAB — GLUCOSE, CAPILLARY
GLUCOSE-CAPILLARY: 180 mg/dL — AB (ref 65–99)
GLUCOSE-CAPILLARY: 142 mg/dL — AB (ref 65–99)
Glucose-Capillary: 195 mg/dL — ABNORMAL HIGH (ref 65–99)
Glucose-Capillary: 164 mg/dL — ABNORMAL HIGH (ref 65–99)

## 2017-09-10 LAB — CREATININE, SERUM
Creatinine, Ser: 1.25 mg/dL — ABNORMAL HIGH (ref 0.44–1.00)
GFR calc non Af Amer: 46 mL/min — ABNORMAL LOW (ref >60)
GFR, EST AFRICAN AMERICAN: 53 mL/min — AB (ref >60)

## 2017-09-10 LAB — POCT I-STAT 3, VENOUS BLOOD GAS (G3P V)
Acid-Base Excess: 5 mmol/L — ABNORMAL HIGH (ref 0.0–2.0)
Bicarbonate: 32 mmol/L — ABNORMAL HIGH (ref 20.0–28.0)
O2 Saturation: 65 %
PCO2 VEN: 57.6 mmHg (ref 44.0–60.0)
PO2 VEN: 37 mmHg (ref 32.0–45.0)
TCO2: 34 mmol/L — AB (ref 22–32)
pH, Ven: 7.352 (ref 7.250–7.430)

## 2017-09-10 LAB — POCT I-STAT 3, ART BLOOD GAS (G3+)
Acid-Base Excess: 5 mmol/L — ABNORMAL HIGH (ref 0.0–2.0)
BICARBONATE: 31 mmol/L — AB (ref 20.0–28.0)
O2 Saturation: 99 %
PCO2 ART: 52 mmHg — AB (ref 32.0–48.0)
PH ART: 7.384 (ref 7.350–7.450)
TCO2: 33 mmol/L — ABNORMAL HIGH (ref 22–32)
pO2, Arterial: 122 mmHg — ABNORMAL HIGH (ref 83.0–108.0)

## 2017-09-10 LAB — ECHOCARDIOGRAM LIMITED
Height: 65 in
WEIGHTICAEL: 5025.6 [oz_av]

## 2017-09-10 LAB — PROTIME-INR
INR: 0.98
INR: 0.99
Prothrombin Time: 12.9 seconds (ref 11.4–15.2)
Prothrombin Time: 13 seconds (ref 11.4–15.2)

## 2017-09-10 SURGERY — RIGHT/LEFT HEART CATH AND CORONARY ANGIOGRAPHY
Anesthesia: LOCAL

## 2017-09-10 MED ORDER — MIDAZOLAM HCL 2 MG/2ML IJ SOLN
INTRAMUSCULAR | Status: AC
  Filled 2017-09-10: qty 2

## 2017-09-10 MED ORDER — ISOSORBIDE MONONITRATE ER 30 MG PO TB24
30.0000 mg | ORAL_TABLET | Freq: Every day | ORAL | Status: DC
  Administered 2017-09-10 – 2017-09-14 (×5): 30 mg via ORAL
  Filled 2017-09-10 (×5): qty 1

## 2017-09-10 MED ORDER — SODIUM CHLORIDE 0.9% FLUSH: 3.0000 mL | Freq: Two times a day (BID) | INTRAVENOUS | Status: DC

## 2017-09-10 MED ORDER — SODIUM CHLORIDE 0.9 % IV SOLN: 250.0000 mL | INTRAVENOUS | Status: DC | PRN

## 2017-09-10 MED ORDER — SODIUM CHLORIDE 0.9 % IV SOLN
INTRAVENOUS | Status: AC
  Administered 2017-09-10: 16:00:00 via INTRAVENOUS

## 2017-09-10 MED ORDER — ATORVASTATIN CALCIUM 80 MG PO TABS
80.0000 mg | ORAL_TABLET | Freq: Every day | ORAL | Status: DC
  Administered 2017-09-10 – 2017-09-13 (×3): 80 mg via ORAL
  Filled 2017-09-10 (×5): qty 1

## 2017-09-10 MED ORDER — HEPARIN (PORCINE) IN NACL 2-0.9 UNIT/ML-% IJ SOLN
INTRAMUSCULAR | Status: AC
  Filled 2017-09-10: qty 500

## 2017-09-10 MED ORDER — HEPARIN SODIUM (PORCINE) 1000 UNIT/ML IJ SOLN
INTRAMUSCULAR | Status: AC
  Filled 2017-09-10: qty 1

## 2017-09-10 MED ORDER — HEPARIN SODIUM (PORCINE) 1000 UNIT/ML IJ SOLN
INTRAMUSCULAR | Status: DC | PRN
  Administered 2017-09-10: 7000 [IU] via INTRAVENOUS

## 2017-09-10 MED ORDER — FENTANYL CITRATE (PF) 100 MCG/2ML IJ SOLN
INTRAMUSCULAR | Status: DC | PRN
  Administered 2017-09-10: 25 ug via INTRAVENOUS

## 2017-09-10 MED ORDER — ACETAMINOPHEN 325 MG PO TABS: 650.0000 mg | ORAL_TABLET | ORAL | Status: DC | PRN

## 2017-09-10 MED ORDER — SODIUM CHLORIDE 0.9 % IV SOLN
INTRAVENOUS | Status: DC
  Administered 2017-09-10: 07:00:00 via INTRAVENOUS

## 2017-09-10 MED ORDER — SODIUM CHLORIDE 0.9 % IV SOLN
INTRAVENOUS | Status: DC
  Administered 2017-09-10: 12:00:00 via INTRAVENOUS

## 2017-09-10 MED ORDER — LIDOCAINE HCL 2 % IJ SOLN
INTRAMUSCULAR | Status: AC
  Filled 2017-09-10: qty 10

## 2017-09-10 MED ORDER — HEPARIN (PORCINE) IN NACL 2-0.9 UNIT/ML-% IJ SOLN
INTRAMUSCULAR | Status: AC
  Filled 2017-09-10: qty 1000

## 2017-09-10 MED ORDER — FENTANYL CITRATE (PF) 100 MCG/2ML IJ SOLN
INTRAMUSCULAR | Status: AC
  Filled 2017-09-10: qty 2

## 2017-09-10 MED ORDER — SODIUM CHLORIDE 0.9% FLUSH
3.0000 mL | Freq: Two times a day (BID) | INTRAVENOUS | Status: DC
  Administered 2017-09-10 – 2017-09-12 (×4): 3 mL via INTRAVENOUS

## 2017-09-10 MED ORDER — SODIUM CHLORIDE 0.9% FLUSH: 3.0000 mL | INTRAVENOUS | Status: DC | PRN

## 2017-09-10 MED ORDER — ENOXAPARIN SODIUM 40 MG/0.4ML ~~LOC~~ SOLN
40.0000 mg | SUBCUTANEOUS | Status: DC
  Administered 2017-09-11 – 2017-09-14 (×3): 40 mg via SUBCUTANEOUS
  Filled 2017-09-10 (×3): qty 0.4

## 2017-09-10 MED ORDER — ASPIRIN 81 MG PO CHEW
81.0000 mg | CHEWABLE_TABLET | ORAL | Status: AC
  Administered 2017-09-10: 81 mg via ORAL
  Filled 2017-09-10: qty 1

## 2017-09-10 MED ORDER — IOPAMIDOL (ISOVUE-370) INJECTION 76%
INTRAVENOUS | Status: AC
  Filled 2017-09-10: qty 100

## 2017-09-10 MED ORDER — VERAPAMIL HCL 2.5 MG/ML IV SOLN
INTRAVENOUS | Status: DC | PRN
  Administered 2017-09-10: 10 mL via INTRA_ARTERIAL

## 2017-09-10 MED ORDER — VERAPAMIL HCL 2.5 MG/ML IV SOLN
INTRAVENOUS | Status: AC
  Filled 2017-09-10: qty 2

## 2017-09-10 MED ORDER — ONDANSETRON HCL 4 MG/2ML IJ SOLN: 4.0000 mg | Freq: Four times a day (QID) | INTRAMUSCULAR | Status: DC | PRN

## 2017-09-10 MED ORDER — ASPIRIN 81 MG PO CHEW
81.0000 mg | CHEWABLE_TABLET | Freq: Every day | ORAL | Status: DC
  Administered 2017-09-11 – 2017-09-14 (×3): 81 mg via ORAL
  Filled 2017-09-10 (×4): qty 1

## 2017-09-10 MED ORDER — HEPARIN (PORCINE) IN NACL 2-0.9 UNIT/ML-% IJ SOLN
INTRAMUSCULAR | Status: AC | PRN
  Administered 2017-09-10: 1000 mL

## 2017-09-10 MED ORDER — IOPAMIDOL (ISOVUE-370) INJECTION 76%
INTRAVENOUS | Status: DC | PRN
  Administered 2017-09-10: 70 mL via INTRAVENOUS

## 2017-09-10 MED ORDER — MIDAZOLAM HCL 2 MG/2ML IJ SOLN
INTRAMUSCULAR | Status: DC | PRN
  Administered 2017-09-10: 1 mg via INTRAVENOUS

## 2017-09-10 MED ORDER — LIDOCAINE HCL (PF) 1 % IJ SOLN
INTRAMUSCULAR | Status: DC | PRN
  Administered 2017-09-10 (×2): 2 mL

## 2017-09-10 MED ORDER — AMLODIPINE BESYLATE 5 MG PO TABS
5.0000 mg | ORAL_TABLET | Freq: Every day | ORAL | Status: DC
  Administered 2017-09-10 – 2017-09-14 (×5): 5 mg via ORAL
  Filled 2017-09-10 (×5): qty 1

## 2017-09-10 MED ORDER — SODIUM CHLORIDE 0.9 % IV SOLN: INTRAVENOUS | Status: DC

## 2017-09-10 MED ORDER — HYDRALAZINE HCL 20 MG/ML IJ SOLN
INTRAMUSCULAR | Status: DC | PRN
  Administered 2017-09-10: 10 mg via INTRAVENOUS

## 2017-09-10 SURGICAL SUPPLY — 15 items
CATH BALLN WEDGE 5F 110CM (CATHETERS) ×1 IMPLANT
CATH INFINITI 5FR ANG PIGTAIL (CATHETERS) ×1 IMPLANT
CATH OPTITORQUE TIG 4.0 5F (CATHETERS) ×1 IMPLANT
COVER PRB 48X5XTLSCP FOLD TPE (BAG) IMPLANT
COVER PROBE 5X48 (BAG) ×2
DEVICE RAD COMP TR BAND LRG (VASCULAR PRODUCTS) ×1 IMPLANT
GLIDESHEATH SLEND SS 6F .021 (SHEATH) ×1 IMPLANT
GUIDEWIRE INQWIRE 1.5J.035X260 (WIRE) IMPLANT
HOVERMATT SINGLE USE (MISCELLANEOUS) ×1 IMPLANT
INQWIRE 1.5J .035X260CM (WIRE) ×2
KIT HEART LEFT (KITS) ×2 IMPLANT
PACK CARDIAC CATHETERIZATION (CUSTOM PROCEDURE TRAY) ×2 IMPLANT
SHEATH GLIDE SLENDER 4/5FR (SHEATH) ×1 IMPLANT
TRANSDUCER W/STOPCOCK (MISCELLANEOUS) ×2 IMPLANT
WIRE MICROINTRODUCER 60CM (WIRE) ×1 IMPLANT

## 2017-09-10 NOTE — Progress Notes (Signed)
Progress Note  Patient Name: Maureen Jackson Date of Encounter: 09/10/2017  Primary Cardiologist: Dr. Harl Bowie  Subjective   Pt denies chest pain and SOB. Resting on side of bed.  Inpatient Medications    Scheduled Meds: . carvedilol  6.25 mg Oral BID WC  . enoxaparin (LOVENOX) injection  40 mg Subcutaneous Q24H  . insulin aspart  0-20 Units Subcutaneous TID WC  . insulin aspart  0-5 Units Subcutaneous QHS  . levothyroxine  225 mcg Oral QAC breakfast  . losartan  100 mg Oral Daily  . mouth rinse  15 mL Mouth Rinse BID  . polyethylene glycol  17 g Oral Daily  . senna-docusate  1 tablet Oral QHS  . simvastatin  10 mg Oral QPM  . sodium chloride flush  3 mL Intravenous Q12H  . sodium chloride flush  3 mL Intravenous Q12H   Continuous Infusions: . sodium chloride    . sodium chloride    . sodium chloride 10 mL/hr at 09/10/17 4098  . sodium chloride     PRN Meds: sodium chloride, sodium chloride, acetaminophen, alum & mag hydroxide-simeth, hydrALAZINE, magnesium hydroxide, ondansetron (ZOFRAN) IV, sodium chloride flush, sodium chloride flush   Vital Signs    Vitals:   09/09/17 1330 09/09/17 2033 09/10/17 0613 09/10/17 0815  BP: (!) 111/44 (!) 141/87 114/63 114/61  Pulse: 74 77 78 78  Resp: 18 18 18    Temp: 98.7 F (37.1 C) 99 F (37.2 C) 98.3 F (36.8 C) 98.4 F (36.9 C)  TempSrc: Oral Oral Oral Oral  SpO2: 96% 98% 94% 98%  Weight:   (!) 314 lb 1.6 oz (142.5 kg)   Height:        Intake/Output Summary (Last 24 hours) at 09/10/17 0932 Last data filed at 09/10/17 1191  Gross per 24 hour  Intake              480 ml  Output             3000 ml  Net            -2520 ml   Filed Weights   09/08/17 0104 09/09/17 0606 09/10/17 0613  Weight: (!) 311 lb 9.6 oz (141.3 kg) (!) 313 lb (142 kg) (!) 314 lb 1.6 oz (142.5 kg)     Physical Exam   General: Well developed, morbidly obese female appearing in no acute distress. Head: Normocephalic, atraumatic.  Neck: Supple  without bruits, no JVD Lungs:  Resp regular and unlabored, CTA but diminished throughout Heart: RRR, S1, S2, no S3, S4, or murmur; no rub. Heart sounds distant Abdomen: Soft, non-tender, non-distended with normoactive bowel sounds. No hepatomegaly. No rebound/guarding. No obvious abdominal masses. Extremities: No clubbing, cyanosis, 1+ B LE edema. Distal pedal pulses are 2+ bilaterally. Neuro: Alert and oriented X 3. Moves all extremities spontaneously. Psych: Normal affect.  Labs    Chemistry Recent Labs Lab 09/05/17 2012  09/07/17 4782 09/09/17 0239 09/10/17 0516  NA 138  < > 141 137 135  K 3.9  < > 4.1 4.1 4.3  CL 103  < > 101 100* 100*  CO2 25  < > 29 31 25   GLUCOSE 154*  < > 154* 161* 180*  BUN 16  < > 34* 34* 35*  CREATININE 1.09*  < > 1.57* 1.49* 1.24*  CALCIUM 8.8*  < > 8.6* 8.6* 8.5*  PROT 8.3*  --   --   --   --   ALBUMIN 3.7  --   --   --   --  AST 36  --   --   --   --   ALT 34  --   --   --   --   ALKPHOS 151*  --   --   --   --   BILITOT 0.9  --   --   --   --   GFRNONAA 54*  < > 35* 37* 47*  GFRAA >60  < > 41* 43* 54*  ANIONGAP 10  < > 11 6 10   < > = values in this interval not displayed.   Hematology Recent Labs Lab 09/05/17 2012 09/06/17 0201 09/10/17 0516  WBC 7.1 7.6 6.7  RBC 4.72 4.38 4.08  HGB 12.3 11.8* 10.6*  HCT 38.7 36.0 34.3*  MCV 82.0 82.2 84.1  MCH 26.1 26.9 26.0  MCHC 31.8 32.8 30.9  RDW 14.0 14.0 14.6  PLT 229 223 218    Cardiac Enzymes Recent Labs Lab 09/05/17 2012 09/06/17 0201 09/06/17 0841 09/06/17 1116  TROPONINI 0.05* 0.08* 0.07* 0.06*   No results for input(s): TROPIPOC in the last 168 hours.   BNP Recent Labs Lab 09/05/17 2225  BNP 386.0*     DDimer  Recent Labs Lab 09/05/17 2012  DDIMER 0.37     Radiology    No results found.   Telemetry    Sinus  - Personally Reviewed  ECG    No new tracings - Personally Reviewed   Cardiac Studies   Echo limited 09/09/17: pending read  R/L heart cath  09/10/17: pending  Echocardiogram 09/06/17 Study Conclusions - Procedure narrative: Transthoracic echocardiography. Image   quality was fair. The study was technically difficult, as a   result of poor sound wave transmission. Intravenous contrast   (Definity) was administered. - Left ventricle: The cavity size was mildly dilated. Systolic   function was moderately to severely reduced. The estimated   ejection fraction was in the range of 30% to 35%. Diffuse   hypokinesis. Doppler parameters are consistent with abnormal left   ventricular relaxation (grade 1 diastolic dysfunction). Mild   concentric and moderate focal basal septal hypertrophy. - Ventricular septum: Septal motion showed abnormal function and   dyssynergy. These changes are consistent with a left bundle   branch block. - Aortic valve: Mildly calcified annulus. - Systemic veins: IVC is dilated with normal respiratory variation.   It was suboptimally visualized. Estimated right atrial pressure:   8 mmHg. - Pericardium, extracardiac: Moderate size pericardial effusion.   There is some right atrial inversion noted, but no frank   tamponade physiology.   Patient Profile     61 y.o. female history of OSA not on cpap, obesity, HTN, DM2, hypothyroidism admitted with SOB. She reports progressing symptoms over the last week. +orthopnea. + LE edema. +palpitaitons. She denies any chest pain. Reports severe DOE just walking to her bathroom at home.   Assessment & Plan    1. Acute systolic and diastolic heart failure - new diagnosis of systolic HF this admission, LVEF 30-35%, grade 1 DD - she presented to [facility] with hypervolemia - diuretics held over the weekend for increasing creatinine - continue ASA, coreg, losartan, and zocor - Plan for right and left heart catheterization today  2. AKI - sCr 1.24 (1.49) - hold lasix today - plan to restart tomorrow, pending labs  3. Moderate pericardial effusion - repeat limited echo  09/09/17  - read pending - Hb has drifted downward: 12.3 --> 11.8 --> 10.6 - HDS - will discuss with  attending, ? Hold ASA, PLT WNL  4. LBBB - new since 09/2015 - question chronic CAD and ICM  5. Hypothyroidism - per primary team - synthroid increased this admission   Signed, Ledora Bottcher , PA-C 9:32 AM 09/10/2017 Pager: [phone]

## 2017-09-10 NOTE — Progress Notes (Signed)
Day of Surgery Procedure(s) (LRB): RIGHT/LEFT HEART CATH AND CORONARY ANGIOGRAPHY (N/A) Subjective: Patient examined, coronary angiograms and echocardiogram images personally reviewed and discussed with patient and family.  61 year old morbidly obese diabetic female smoker  BMI  54 with sleep apnea COPD history of DVT and pulmonary emboli recently admitted with heart failure symptoms. Echocardiogram shows EF of 30%. Cardiac catheterization performed by Dr. Georgina Peer demonstrated severe three-vessel coronary artery disease in a diabetic pattern with diffuse and distal high-grade stenoses with targets not adequate for CABG. PA pressures are 45/25 with LVEDP of 33, CVP 15. There is a small pericardial effusion but no evidence of tamponade by echo or by right heart cath. Because of her severe three-vessel CAD a CT surgical evaluation was requested. The patient is currently comfortable lying supine in her telemetry room.  Objective: Vital signs in last 24 hours: Temp:  [98.3 F (36.8 C)-99 F (37.2 C)] 98.6 F (37 C) (10/01 1225) Pulse Rate:  [0-83] 0 (10/01 1410) Cardiac Rhythm: Normal sinus rhythm;Bundle branch block (10/01 1021) Resp:  [14-25] 14 (10/01 1410) BP: (114-182)/(61-107) 128/65 (10/01 1840) SpO2:  [0 %-99 %] 0 % (10/01 1410) Weight:  [314 lb 1.6 oz (142.5 kg)] 314 lb 1.6 oz (142.5 kg) (10/01 6712)  Hemodynamic parameters for last 24 hours:    Intake/Output from previous day: 09/30 0701 - 10/01 0700 In: 480 [P.O.:480] Out: 2400 [Urine:2400] Intake/Output this shift: No intake/output data recorded.       Exam    General- alert and comfortable, morbidly obese   Lungs-distant breath sounds few scattered rhonchi   Cor- regular rate and rhythm, no murmur , gallop   Abdomen- soft, obese, non-tender, positive bowel sounds   Extremities - warm, non-tender, minimal edema, distal pulses 2+   Neuro- oriented, appropriate, no focal weakness, moves all extremities   Lab  Results:  Recent Labs  09/10/17 0516 09/10/17 0757  WBC 6.7 6.6  HGB 10.6* 10.8*  HCT 34.3* 35.4*  PLT 218 226   BMET:  Recent Labs  09/09/17 0239 09/10/17 0516 09/10/17 0757  NA 137 135  --   K 4.1 4.3  --   CL 100* 100*  --   CO2 31 25  --   GLUCOSE 161* 180*  --   BUN 34* 35*  --   CREATININE 1.49* 1.24* 1.25*  CALCIUM 8.6* 8.5*  --     PT/INR:  Recent Labs  09/10/17 0757  LABPROT 13.0  INR 0.99   ABG    Component Value Date/Time   PHART 7.384 09/10/2017 1345   HCO3 31.0 (H) 09/10/2017 1345   TCO2 33 (H) 09/10/2017 1345   O2SAT 99.0 09/10/2017 1345   CBG (last 3)   Recent Labs  09/10/17 0735 09/10/17 1200 09/10/17 1726  GLUCAP 180* 195* 142*    Assessment/Plan: Acute systolic and diastolic heart failure Moderate to severe LV dysfunction with LVH and cardiomegaly Severe three-vessel coronary disease in a diabetic pattern with significant distal stenoses not amenable to CABG Sleep apnea, COPD, and pulmonary hypertension  Recommend consideration of PCI of LAD versus medical therapy for her CAD.  LOS: 4 days    Tharon Aquas Trigt III 09/10/2017

## 2017-09-10 NOTE — Interval H&P Note (Signed)
Cath Lab Visit (complete for each Cath Lab visit)  Clinical Evaluation Leading to the Procedure:   ACS: No.  Non-ACS:    Anginal Classification: CCS III  Anti-ischemic medical therapy: Minimal Therapy (1 class of medications)  Non-Invasive Test Results: No non-invasive testing performed  Prior CABG: No previous CABG      History and Physical Interval Note:  09/10/2017 1:03 PM  Maureen Jackson  has presented today for surgery, with the diagnosis of cp  The various methods of treatment have been discussed with the patient and family. After consideration of risks, benefits and other options for treatment, the patient has consented to  Procedure(s): RIGHT/LEFT HEART CATH AND CORONARY ANGIOGRAPHY (N/A) as a surgical intervention .  The patient's history has been reviewed, patient examined, no change in status, stable for surgery.  I have reviewed the patient's chart and labs.  Questions were answered to the patient's satisfaction.     Shelva Majestic

## 2017-09-10 NOTE — Progress Notes (Signed)
Gabriel Cirri RN will attempt the 2nd IV, if unsuccessful the IV team will be contacted. Catalina Pizza

## 2017-09-10 NOTE — H&P (View-Only) (Signed)
Progress Note  Patient Name: Maureen Jackson Date of Encounter: 09/10/2017  Primary Cardiologist: Dr. Harl Bowie  Subjective   Pt denies chest pain and SOB. Resting on side of bed.  Inpatient Medications    Scheduled Meds: . carvedilol  6.25 mg Oral BID WC  . enoxaparin (LOVENOX) injection  40 mg Subcutaneous Q24H  . insulin aspart  0-20 Units Subcutaneous TID WC  . insulin aspart  0-5 Units Subcutaneous QHS  . levothyroxine  225 mcg Oral QAC breakfast  . losartan  100 mg Oral Daily  . mouth rinse  15 mL Mouth Rinse BID  . polyethylene glycol  17 g Oral Daily  . senna-docusate  1 tablet Oral QHS  . simvastatin  10 mg Oral QPM  . sodium chloride flush  3 mL Intravenous Q12H  . sodium chloride flush  3 mL Intravenous Q12H   Continuous Infusions: . sodium chloride    . sodium chloride    . sodium chloride 10 mL/hr at 09/10/17 6269  . sodium chloride     PRN Meds: sodium chloride, sodium chloride, acetaminophen, alum & mag hydroxide-simeth, hydrALAZINE, magnesium hydroxide, ondansetron (ZOFRAN) IV, sodium chloride flush, sodium chloride flush   Vital Signs    Vitals:   09/09/17 1330 09/09/17 2033 09/10/17 0613 09/10/17 0815  BP: (!) 111/44 (!) 141/87 114/63 114/61  Pulse: 74 77 78 78  Resp: 18 18 18    Temp: 98.7 F (37.1 C) 99 F (37.2 C) 98.3 F (36.8 C) 98.4 F (36.9 C)  TempSrc: Oral Oral Oral Oral  SpO2: 96% 98% 94% 98%  Weight:   (!) 314 lb 1.6 oz (142.5 kg)   Height:        Intake/Output Summary (Last 24 hours) at 09/10/17 0932 Last data filed at 09/10/17 4854  Gross per 24 hour  Intake              480 ml  Output             3000 ml  Net            -2520 ml   Filed Weights   09/08/17 0104 09/09/17 0606 09/10/17 0613  Weight: (!) 311 lb 9.6 oz (141.3 kg) (!) 313 lb (142 kg) (!) 314 lb 1.6 oz (142.5 kg)     Physical Exam   General: Well developed, morbidly obese female appearing in no acute distress. Head: Normocephalic, atraumatic.  Neck: Supple  without bruits, no JVD Lungs:  Resp regular and unlabored, CTA but diminished throughout Heart: RRR, S1, S2, no S3, S4, or murmur; no rub. Heart sounds distant Abdomen: Soft, non-tender, non-distended with normoactive bowel sounds. No hepatomegaly. No rebound/guarding. No obvious abdominal masses. Extremities: No clubbing, cyanosis, 1+ B LE edema. Distal pedal pulses are 2+ bilaterally. Neuro: Alert and oriented X 3. Moves all extremities spontaneously. Psych: Normal affect.  Labs    Chemistry Recent Labs Lab 09/05/17 2012  09/07/17 6270 09/09/17 0239 09/10/17 0516  NA 138  < > 141 137 135  K 3.9  < > 4.1 4.1 4.3  CL 103  < > 101 100* 100*  CO2 25  < > 29 31 25   GLUCOSE 154*  < > 154* 161* 180*  BUN 16  < > 34* 34* 35*  CREATININE 1.09*  < > 1.57* 1.49* 1.24*  CALCIUM 8.8*  < > 8.6* 8.6* 8.5*  PROT 8.3*  --   --   --   --   ALBUMIN 3.7  --   --   --   --  AST 36  --   --   --   --   ALT 34  --   --   --   --   ALKPHOS 151*  --   --   --   --   BILITOT 0.9  --   --   --   --   GFRNONAA 54*  < > 35* 37* 47*  GFRAA >60  < > 41* 43* 54*  ANIONGAP 10  < > 11 6 10   < > = values in this interval not displayed.   Hematology Recent Labs Lab 09/05/17 2012 09/06/17 0201 09/10/17 0516  WBC 7.1 7.6 6.7  RBC 4.72 4.38 4.08  HGB 12.3 11.8* 10.6*  HCT 38.7 36.0 34.3*  MCV 82.0 82.2 84.1  MCH 26.1 26.9 26.0  MCHC 31.8 32.8 30.9  RDW 14.0 14.0 14.6  PLT 229 223 218    Cardiac Enzymes Recent Labs Lab 09/05/17 2012 09/06/17 0201 09/06/17 0841 09/06/17 1116  TROPONINI 0.05* 0.08* 0.07* 0.06*   No results for input(s): TROPIPOC in the last 168 hours.   BNP Recent Labs Lab 09/05/17 2225  BNP 386.0*     DDimer  Recent Labs Lab 09/05/17 2012  DDIMER 0.37     Radiology    No results found.   Telemetry    Sinus  - Personally Reviewed  ECG    No new tracings - Personally Reviewed   Cardiac Studies   Echo limited 09/09/17: pending read  R/L heart cath  09/10/17: pending  Echocardiogram 09/06/17 Study Conclusions - Procedure narrative: Transthoracic echocardiography. Image   quality was fair. The study was technically difficult, as a   result of poor sound wave transmission. Intravenous contrast   (Definity) was administered. - Left ventricle: The cavity size was mildly dilated. Systolic   function was moderately to severely reduced. The estimated   ejection fraction was in the range of 30% to 35%. Diffuse   hypokinesis. Doppler parameters are consistent with abnormal left   ventricular relaxation (grade 1 diastolic dysfunction). Mild   concentric and moderate focal basal septal hypertrophy. - Ventricular septum: Septal motion showed abnormal function and   dyssynergy. These changes are consistent with a left bundle   branch block. - Aortic valve: Mildly calcified annulus. - Systemic veins: IVC is dilated with normal respiratory variation.   It was suboptimally visualized. Estimated right atrial pressure:   8 mmHg. - Pericardium, extracardiac: Moderate size pericardial effusion.   There is some right atrial inversion noted, but no frank   tamponade physiology.   Patient Profile     61 y.o. female history of OSA not on cpap, obesity, HTN, DM2, hypothyroidism admitted with SOB. She reports progressing symptoms over the last week. +orthopnea. + LE edema. +palpitaitons. She denies any chest pain. Reports severe DOE just walking to her bathroom at home.   Assessment & Plan    1. Acute systolic and diastolic heart failure - new diagnosis of systolic HF this admission, LVEF 30-35%, grade 1 DD - she presented to [facility] [place] with hypervolemia - diuretics held over the weekend for increasing creatinine - continue ASA, coreg, losartan, and zocor - Plan for right and left heart catheterization today  2. AKI - sCr 1.24 (1.49) - hold lasix today - plan to restart tomorrow, pending labs  3. Moderate pericardial effusion - repeat limited echo  09/09/17  - read pending - Hb has drifted downward: 12.3 --> 11.8 --> 10.6 - HDS - will discuss with  attending, ? Hold ASA, PLT WNL  4. LBBB - new since 09/2015 - question chronic CAD and ICM  5. Hypothyroidism - per primary team - synthroid increased this admission   Signed, Ledora Bottcher , PA-C 9:32 AM 09/10/2017 Pager: [phone]

## 2017-09-10 NOTE — Progress Notes (Signed)
PROGRESS NOTE    Maureen Jackson  EYC:[id number] DOB: 03-22-1956 DOA: 09/05/2017 PCP: Patient, No Pcp Per    Brief Narrative:  61 year old female who presented with dyspnea. Patient does have a significant past medical history of morbid obesity, hypothyroidism, ulcers, hypertension, and diabetes mellitus type 2. Patient presents with worsening dyspnea over last 7 days, dyspneic with minimal efforts, associated with lower extremity edema. On initial physical examination blood pressure 194/95, 226/104, heart rate 104, respiratory 22, oxygen saturation 96%. Lungs with decreased breath sounds bilaterally, increased respiratory effort, heart S1-S2 present and rhythmic, no gallops, rubs or murmurs, the abdomen, protuberant, nontender, nondistended, lower extremities with no edema. Sodium 138, potassium 3.9, chloride 103, bicarbonate 25, glucose 154, BUN 16, creatinine 1.09, troponin 0.05, white count 7.1, hemoglobin 12.3, hematocrit 30.7, platelets 229. Chest x-ray with increased lung markings bilaterally, cardiomegaly. EKG sinus rhythm, left bundle branch block, septal Q waves, V1, V2 and V3.   Patient was admitted with a working diagnosis of decompensated heart failure. Further workup with echocardiography showed ejection fraction of 30-35% with a moderate pericardial effusion.    Assessment & Plan:   Principal Problem:   Acute combined systolic (congestive) and diastolic (congestive) heart failure (HCC) Active Problems:   Hypothyroidism   Essential hypertension   Sleep apnea   Morbid obesity ([place])   Diabetes mellitus type 2 with complications, uncontrolled (HCC)   Pericardial effusion   AKI (acute kidney injury) ([place])   Elevated troponin I level  1. Decompensated heart failure, systolic, acute on chronic. Continue with heart failure regimen with carvedilol at 25 mg po bid, plus 100 mg of losartan, isosorbide and statin therapy. Improved exertion capacity, for cardiac catheterization today.  Holding on ace inh due to risk of worsening renal function.   2. Hypothyroidism. Continue levothyroxine.  3. Hypertension. Blood pressure controlled with Carvedilol and amlodipine/ isosorbide.   4. Acute kidney injury. Renal function with serum creatinine down to 1,2 from 1,4, will add slow rate of saline before contrast exposure to prevent contrast induced cardiomyopathy, will use slower rate than recommended to prevent volume overload in the setting of heart failure, will follow on renal panel in am.     5. Type 2 diabetes mellitus. Continue glucose cover and monitoring with sliding scale, capillary glucose 197, 120, 128, 180, 195.   6. Sleep apnea and morbid obesity. Supplemental -02 per [place] at night. Will need close follow up as outpatient.   DVT prophylaxis:enoxaparin Code Status:full code Family Communication: Disposition Plan:   Consultants:  Cardiology  Procedures:    Antimicrobials:   Subjective: Dyspnea has improved, able to ambulate in her room, no nausea or vomiting, no chest pain or palpitations.   Objective: Vitals:   09/09/17 1330 09/09/17 2033 09/10/17 0613 09/10/17 0815  BP: (!) 111/44 (!) 141/87 114/63 114/61  Pulse: 74 77 78 78  Resp: 18 18 18    Temp: 98.7 F (37.1 C) 99 F (37.2 C) 98.3 F (36.8 C) 98.4 F (36.9 C)  TempSrc: Oral Oral Oral Oral  SpO2: 96% 98% 94% 98%  Weight:   (!) 142.5 kg (314 lb 1.6 oz)   Height:        Intake/Output Summary (Last 24 hours) at 09/10/17 0949 Last data filed at 09/10/17 0906  Gross per 24 hour  Intake              480 ml  Output             3000 ml  Net            -  2520 ml   Filed Weights   09/08/17 0104 09/09/17 0606 09/10/17 0613  Weight: (!) 141.3 kg (311 lb 9.6 oz) (!) 142 kg (313 lb) (!) 142.5 kg (314 lb 1.6 oz)    Examination:  General: Not in pain or dyspnea, deconditioned Neurology: Awake and alert, non focal  E ENT: no pallor, no icterus, oral mucosa moist Cardiovascular:  No JVD. S1-S2 present, rhythmic, no gallops, rubs, or murmurs. Trace lower extremity edema. Pulmonary: vesicular breath sounds bilaterally, adequate air movement, no wheezing, rhonchi or rales. Limited exam.  Gastrointestinal. Abdomen protuberant, no organomegaly, non tender, no rebound or guarding Skin. No rashes Musculoskeletal: no joint deformities     Data Reviewed: I have personally reviewed following labs and imaging studies  CBC:  Recent Labs Lab 09/05/17 2012 09/06/17 0201 09/10/17 0516  WBC 7.1 7.6 6.7  NEUTROABS 3.5 4.0  --   HGB 12.3 11.8* 10.6*  HCT 38.7 36.0 34.3*  MCV 82.0 82.2 84.1  PLT 229 223 119   Basic Metabolic Panel:  Recent Labs Lab 09/05/17 2012 09/06/17 0201 09/07/17 0633 09/09/17 0239 09/10/17 0516  NA 138 138 141 137 135  K 3.9 3.5 4.1 4.1 4.3  CL 103 102 101 100* 100*  CO2 25 26 29 31 25   GLUCOSE 154* 192* 154* 161* 180*  BUN 16 17 34* 34* 35*  CREATININE 1.09* 1.15* 1.57* 1.49* 1.24*  CALCIUM 8.8* 8.5* 8.6* 8.6* 8.5*   GFR: Estimated Creatinine Clearance: 70.3 mL/min (A) (by C-G formula based on SCr of 1.24 mg/dL (H)). Liver Function Tests:  Recent Labs Lab 09/05/17 2012  AST 36  ALT 34  ALKPHOS 151*  BILITOT 0.9  PROT 8.3*  ALBUMIN 3.7   No results for input(s): LIPASE, AMYLASE in the last 168 hours. No results for input(s): AMMONIA in the last 168 hours. Coagulation Profile:  Recent Labs Lab 09/10/17 0516 09/10/17 0757  INR 0.98 0.99   Cardiac Enzymes:  Recent Labs Lab 09/05/17 2012 09/06/17 0201 09/06/17 0841 09/06/17 1116  TROPONINI 0.05* 0.08* 0.07* 0.06*   BNP (last 3 results) No results for input(s): PROBNP in the last 8760 hours. HbA1C: No results for input(s): HGBA1C in the last 72 hours. CBG:  Recent Labs Lab 09/09/17 0721 09/09/17 1108 09/09/17 1637 09/09/17 2101 09/10/17 0735  GLUCAP 188* 197* 120* 128* 180*   Lipid Profile:  Recent Labs  09/09/17 0239  CHOL 124  HDL 32*  LDLCALC  57  TRIG 177*  CHOLHDL 3.9   Thyroid Function Tests: No results for input(s): TSH, T4TOTAL, FREET4, T3FREE, THYROIDAB in the last 72 hours. Anemia Panel: No results for input(s): VITAMINB12, FOLATE, FERRITIN, TIBC, IRON, RETICCTPCT in the last 72 hours.    Radiology Studies: I have reviewed all of the imaging during this hospital visit personally     Scheduled Meds: . carvedilol  6.25 mg Oral BID WC  . enoxaparin (LOVENOX) injection  40 mg Subcutaneous Q24H  . insulin aspart  0-20 Units Subcutaneous TID WC  . insulin aspart  0-5 Units Subcutaneous QHS  . levothyroxine  225 mcg Oral QAC breakfast  . losartan  100 mg Oral Daily  . mouth rinse  15 mL Mouth Rinse BID  . polyethylene glycol  17 g Oral Daily  . senna-docusate  1 tablet Oral QHS  . simvastatin  10 mg Oral QPM  . sodium chloride flush  3 mL Intravenous Q12H  . sodium chloride flush  3 mL Intravenous Q12H   Continuous Infusions: .  sodium chloride    . sodium chloride    . sodium chloride 10 mL/hr at 09/10/17 5992  . sodium chloride       LOS: 4 days        Tawni Millers, MD [facility] Pager [phone]

## 2017-09-10 NOTE — Care Management Important Message (Signed)
Important Message  Patient Details  Name: [name] MRN: [id number] Date of Birth: 03-Jul-1956   Medicare Important Message Given:  Yes    Ameris Akamine 09/10/2017, 1:17 PM

## 2017-09-10 NOTE — Progress Notes (Signed)
  Echocardiogram 2D Echocardiogram has been performed.  Jennette Dubin 09/10/2017, 11:58 AM

## 2017-09-11 ENCOUNTER — Encounter (HOSPITAL_COMMUNITY): Payer: Self-pay | Admitting: Cardiovascular Disease

## 2017-09-11 DIAGNOSIS — I2510 Atherosclerotic heart disease of native coronary artery without angina pectoris: Secondary | ICD-10-CM

## 2017-09-11 DIAGNOSIS — I5021 Acute systolic (congestive) heart failure: Secondary | ICD-10-CM

## 2017-09-11 LAB — CBC
HEMATOCRIT: 33.7 % — AB (ref 36.0–46.0)
Hemoglobin: 10.5 g/dL — ABNORMAL LOW (ref 12.0–15.0)
MCH: 25.9 pg — AB (ref 26.0–34.0)
MCHC: 31.2 g/dL (ref 30.0–36.0)
MCV: 83.2 fL (ref 78.0–100.0)
PLATELETS: 214 10*3/uL (ref 150–400)
RBC: 4.05 MIL/uL (ref 3.87–5.11)
RDW: 14.3 % (ref 11.5–15.5)
WBC: 7.3 10*3/uL (ref 4.0–10.5)

## 2017-09-11 LAB — GLUCOSE, CAPILLARY
GLUCOSE-CAPILLARY: 118 mg/dL — AB (ref 65–99)
GLUCOSE-CAPILLARY: 165 mg/dL — AB (ref 65–99)
Glucose-Capillary: 199 mg/dL — ABNORMAL HIGH (ref 65–99)
Glucose-Capillary: 138 mg/dL — ABNORMAL HIGH (ref 65–99)

## 2017-09-11 LAB — BASIC METABOLIC PANEL
Anion gap: 7 (ref 5–15)
BUN: 30 mg/dL — AB (ref 6–20)
CHLORIDE: 101 mmol/L (ref 101–111)
CO2: 28 mmol/L (ref 22–32)
CREATININE: 1.28 mg/dL — AB (ref 0.44–1.00)
Calcium: 8.5 mg/dL — ABNORMAL LOW (ref 8.9–10.3)
GFR calc Af Amer: 52 mL/min — ABNORMAL LOW (ref >60)
GFR calc non Af Amer: 45 mL/min — ABNORMAL LOW (ref >60)
Glucose, Bld: 114 mg/dL — ABNORMAL HIGH (ref 65–99)
POTASSIUM: 4.8 mmol/L (ref 3.5–5.1)
Sodium: 136 mmol/L (ref 135–145)

## 2017-09-11 MED ORDER — SODIUM CHLORIDE 0.9 % IV SOLN
INTRAVENOUS | Status: DC
  Administered 2017-09-12: 07:00:00 via INTRAVENOUS

## 2017-09-11 MED ORDER — SODIUM CHLORIDE 0.9 % IV SOLN
INTRAVENOUS | Status: AC
  Administered 2017-09-11: 14:00:00 via INTRAVENOUS

## 2017-09-11 MED ORDER — SODIUM CHLORIDE 0.9 % IV SOLN
250.0000 mL | INTRAVENOUS | Status: DC | PRN
Start: 2017-09-11 — End: 2017-09-12

## 2017-09-11 MED ORDER — SODIUM CHLORIDE 0.9% FLUSH: 3.0000 mL | INTRAVENOUS | Status: DC | PRN

## 2017-09-11 MED ORDER — CLOPIDOGREL BISULFATE 75 MG PO TABS
300.0000 mg | ORAL_TABLET | Freq: Once | ORAL | Status: AC
  Administered 2017-09-12: 300 mg via ORAL
  Filled 2017-09-11: qty 4

## 2017-09-11 MED ORDER — SODIUM CHLORIDE 0.9% FLUSH
3.0000 mL | Freq: Two times a day (BID) | INTRAVENOUS | Status: DC
  Administered 2017-09-11 (×2): 3 mL via INTRAVENOUS

## 2017-09-11 MED ORDER — ASPIRIN 81 MG PO CHEW
81.0000 mg | CHEWABLE_TABLET | ORAL | Status: AC
  Administered 2017-09-12: 81 mg via ORAL
  Filled 2017-09-11: qty 1

## 2017-09-11 MED FILL — Lidocaine HCl Local Inj 2%: INTRAMUSCULAR | Qty: 10 | Status: AC

## 2017-09-11 NOTE — Progress Notes (Signed)
Progress Note  Patient Name: Maureen Jackson Date of Encounter: 09/11/2017  Primary Cardiologist: Dr. Harl Bowie  Subjective   Pt denies further chest pain. Pt states she needs another day in the hospital.  Inpatient Medications    Scheduled Meds: . amLODipine  5 mg Oral Daily  . aspirin  81 mg Oral Daily  . atorvastatin  80 mg Oral q1800  . carvedilol  6.25 mg Oral BID WC  . enoxaparin (LOVENOX) injection  40 mg Subcutaneous Q24H  . insulin aspart  0-20 Units Subcutaneous TID WC  . insulin aspart  0-5 Units Subcutaneous QHS  . isosorbide mononitrate  30 mg Oral Daily  . levothyroxine  225 mcg Oral QAC breakfast  . losartan  100 mg Oral Daily  . mouth rinse  15 mL Mouth Rinse BID  . polyethylene glycol  17 g Oral Daily  . senna-docusate  1 tablet Oral QHS  . sodium chloride flush  3 mL Intravenous Q12H  . sodium chloride flush  3 mL Intravenous Q12H   Continuous Infusions: . sodium chloride    . sodium chloride    . sodium chloride     PRN Meds: sodium chloride, sodium chloride, acetaminophen, alum & mag hydroxide-simeth, hydrALAZINE, magnesium hydroxide, ondansetron (ZOFRAN) IV, sodium chloride flush, sodium chloride flush   Vital Signs    Vitals:   09/10/17 2031 09/11/17 0000 09/11/17 0200 09/11/17 0325  BP: 120/60 120/70 120/60 (!) 128/59  Pulse: 82 82 82 88  Resp:    18  Temp:    99.4 F (37.4 C)  TempSrc:    Oral  SpO2: 98% 98% 98% 98%  Weight:    (!) 312 lb 4.8 oz (141.7 kg)  Height:        Intake/Output Summary (Last 24 hours) at 09/11/17 0916 Last data filed at 09/11/17 0747  Gross per 24 hour  Intake              700 ml  Output             2700 ml  Net            -2000 ml   Filed Weights   09/09/17 0606 09/10/17 0613 09/11/17 0325  Weight: (!) 313 lb (142 kg) (!) 314 lb 1.6 oz (142.5 kg) (!) 312 lb 4.8 oz (141.7 kg)     Physical Exam   General: Well developed, morbidly obese female appearing in no acute distress. Head: Normocephalic,  atraumatic.  Neck: Supple without bruits, no JVD, exam difficult Lungs:  Resp regular and unlabored, CTA. Heart: RRR, S1, S2, no murmur Abdomen: Soft, non-tender, non-distended with normoactive bowel sounds. No hepatomegaly. No rebound/guarding. No obvious abdominal masses. Extremities: No clubbing, cyanosis, trace edema. Distal pedal pulses are 1+ bilaterally. Neuro: Alert and oriented X 3. Moves all extremities spontaneously. Psych: Normal affect.  Labs    Chemistry Recent Labs Lab 09/05/17 2012  09/09/17 0239 09/10/17 0516 09/10/17 0757 09/11/17 0549  NA 138  < > 137 135  --  136  K 3.9  < > 4.1 4.3  --  4.8  CL 103  < > 100* 100*  --  101  CO2 25  < > 31 25  --  28  GLUCOSE 154*  < > 161* 180*  --  114*  BUN 16  < > 34* 35*  --  30*  CREATININE 1.09*  < > 1.49* 1.24* 1.25* 1.28*  CALCIUM 8.8*  < > 8.6* 8.5*  --  8.5*  PROT 8.3*  --   --   --   --   --   ALBUMIN 3.7  --   --   --   --   --   AST 36  --   --   --   --   --   ALT 34  --   --   --   --   --   ALKPHOS 151*  --   --   --   --   --   BILITOT 0.9  --   --   --   --   --   GFRNONAA 54*  < > 37* 47* 46* 45*  GFRAA >60  < > 43* 54* 53* 52*  ANIONGAP 10  < > 6 10  --  7  < > = values in this interval not displayed.   Hematology Recent Labs Lab 09/10/17 0516 09/10/17 0757 09/11/17 0549  WBC 6.7 6.6 7.3  RBC 4.08 4.23 4.05  HGB 10.6* 10.8* 10.5*  HCT 34.3* 35.4* 33.7*  MCV 84.1 83.7 83.2  MCH 26.0 25.5* 25.9*  MCHC 30.9 30.5 31.2  RDW 14.6 14.1 14.3  PLT 218 226 214    Cardiac Enzymes Recent Labs Lab 09/05/17 2012 09/06/17 0201 09/06/17 0841 09/06/17 1116  TROPONINI 0.05* 0.08* 0.07* 0.06*   No results for input(s): TROPIPOC in the last 168 hours.   BNP Recent Labs Lab 09/05/17 2225  BNP 386.0*     DDimer  Recent Labs Lab 09/05/17 2012  DDIMER 0.37     Radiology    No results found.   Telemetry    Sinus with PVCs - Personally Reviewed  ECG    No new tracings - Personally  Reviewed   Cardiac Studies   Right and left heart cath 09/10/17:  Prox RCA lesion, 30 %stenosed.  Prox RCA to Mid RCA lesion, 70 %stenosed.  Mid RCA lesion, 90 %stenosed.  RPDA lesion, 80 %stenosed.  Prox Cx lesion, 20 %stenosed.  Ost 1st Mrg lesion, 80 %stenosed.  1st Mrg-2 lesion, 80 %stenosed.  1st Mrg-1 lesion, 90 %stenosed.  Mid Cx-2 lesion, 50 %stenosed.  Mid Cx-1 lesion, 60 %stenosed.  Prox LAD lesion, 60 %stenosed.  Mid LAD lesion, 85 %stenosed.   Severe multivessel CAD with 60% proximal LAD stenosis and 85% mid LAD stenosis; 80, 90 and 80%  OM1 stenosis of the left circumflex with 60% mid AV groove and 50% mid distal AV groove stenosis; dominant RCA with 30% proximal followed by 70% proximal stenosis, 90% mid stenosis, and diffuse mid distal PDA stenosis of at least 80%.  LVEDP 33 mmHg;  EF by previous echo 30-35%; left ventriculography not performed.  Moderate right heart pressure elevation with mild/moderate pulmonary hypertension.  RECOMMENDATION: In this diabetic female with significant multivessel CAD, recommend surgical consultation for consideration for CABG revascularization.    Echocardiogram 09/10/17: Impressions: - This was a limited study for pericardial effusion evaluation.   When compared to the prior study from 09/06/2017 there is   unchanged amount of moderate circumferential pericardial effusion   - 2 cm around the right ventricle and posteriorly.   There is right atrial invagination and but complete RV filling in   diastole. IVC is collapsing.   No signs of tamponade.   A follow up study in 1 week is recommended.   Echocardiogram 09/06/17: Study Conclusions - Procedure narrative: Transthoracic echocardiography. Image   quality was fair. The study was technically difficult, as a  result of poor sound wave transmission. Intravenous contrast   (Definity) was administered. - Left ventricle: The cavity size was mildly dilated.  Systolic   function was moderately to severely reduced. The estimated   ejection fraction was in the range of 30% to 35%. Diffuse   hypokinesis. Doppler parameters are consistent with abnormal left   ventricular relaxation (grade 1 diastolic dysfunction). Mild   concentric and moderate focal basal septal hypertrophy. - Ventricular septum: Septal motion showed abnormal function and   dyssynergy. These changes are consistent with a left bundle   branch block. - Aortic valve: Mildly calcified annulus. - Systemic veins: IVC is dilated with normal respiratory variation.   It was suboptimally visualized. Estimated right atrial pressure:   8 mmHg. - Pericardium, extracardiac: Moderate size pericardial effusion.   There is some right atrial inversion noted, but no frank   tamponade physiology.   Patient Profile     61 y.o. female with a history of OSA not on cpap, obesity, HTN, DM2, hypothyroidism admitted with SOB. She reports progressing symptoms over the last week. +orthopnea. + LE edema. +palpitaitons. She denies any chest pain. Reports severe DOE just walking to her bathroom at home. She underwent heart cath with multi-vessel disease. She was evaluated by [facility] and was not a candidate for CABG.  Assessment & Plan    1. Chest pain, multi-vessel disease by heart cath - pt was evaluated by [facility] yesterday and has poor targets not amenable to CABG - will discuss medical management +/- utility of staged PCI with attending and interventionalist - pt denies further chest pain, can consider starting ranexa - continue ASA, lipitor  2. AKI - sCr stable 1.28 (1.25) - making adequate urine - per primary team  3. Moderate pericardial effusion - limited echo yesterday with moderate circumferential pericardial effusion, without tamponade physiology - right ventricle is filling in diastole and vena cava is collapsing - HDS  4. Chronic systolic and diastolic heart failure - LVEF 30-35% and grade 1  DD - continue norvasc, coreg, imdur, losartan - will hold off on lasix today to give her kidneys one more day, consider starting tomorrow - this may also help her pericardial effusion  5. HTN - pressures well-controlled on current regimen   Signed, Ledora Bottcher , PA-C 9:16 AM 09/11/2017 Pager: [phone]

## 2017-09-11 NOTE — Progress Notes (Signed)
PROGRESS NOTE    Maureen Jackson  WUX:[id number] DOB: October 26, 1956 DOA: 09/05/2017 PCP: Patient, No Pcp Per    Brief Narrative:  61 year old female who presented with dyspnea. Patient does have a significant past medical history of morbid obesity, hypothyroidism, ulcers, hypertension, and diabetes mellitus type 2. Patient presents with worsening dyspnea over last 7 days, dyspneic with minimal efforts, associated with lower extremity edema. On initial physical examination blood pressure 194/95, 226/104, heart rate 104, respiratory 22, oxygen saturation 96%. Lungs with decreased breath sounds bilaterally, increased respiratory effort, heart S1-S2 present and rhythmic, no gallops, rubs or murmurs, the abdomen, protuberant, nontender, nondistended, lower extremities with no edema. Sodium 138, potassium 3.9, chloride 103, bicarbonate 25, glucose 154, BUN 16, creatinine 1.09, troponin 0.05, white count 7.1, hemoglobin 12.3, hematocrit 30.7, platelets 229. Chest x-ray with increased lung markings bilaterally, cardiomegaly. EKG sinus rhythm, left bundle branch block, septal Q waves, V1, V2 and V3.   Patient was admitted with a working diagnosis of decompensated heart failure. Further workup with echocardiography showed ejection fraction of 30-35% with a moderate pericardial effusion.     Assessment & Plan:   Principal Problem:   Acute combined systolic (congestive) and diastolic (congestive) heart failure (HCC) Active Problems:   Hypothyroidism   Essential hypertension   Sleep apnea   Morbid obesity (Maureen Jackson)   Diabetes mellitus type 2 with complications, uncontrolled (HCC)   Pericardial effusion   AKI (acute kidney injury) ([place])   Elevated troponin I level   CAD, multiple vessel   1. Decompensated heart failure, systolic ef 30 to 25%, acute on chronic. On carvedilol at 25 mg po bid, 100 mg of losartan, isosorbide and statin therapy for heart failure management, clinically euvolemic.   2. Coronary  artery disease.  Cardiac catheterization showed severe multivessel cad with 60% proximal LAD stenosis and 85% mid LAD stenosis, not candidate for coronary artery bypass surgery, will follow recommendations from interventional cardiology. Patient remains chest pain free. Will continue b blockade, asa and clopidogrel. High potency high does statin plus imdur.   2. Hypothyroidism. On levothyroxine.  3. Hypertension. Continue blood pressure control with carvedilol, amlodipine, losartan and  isosorbide.   4. Acute kidney injury. Stable renal function with serum cr at 1,28, patient received isotonic saline peri procedure. K at 4,8 and serum bicarbonate at 28, will follow on renal function in am, will continue to hold on furosemide for now.   5. Type 2 diabetes mellitus. Glucose cover and monitoring with sliding scale, capillary glucose 164, 118, 165  6. Sleep apnea and morbid obesity BMI 51. Continue supplemental -02 per Uncertain at night. Will need close follow up as outpatient. Life style modification.   DVT prophylaxis:enoxaparin Code Status:full code Family Communication: Disposition Plan:   Consultants:  Cardiology  Procedures:    Antimicrobials   Subjective: Patient feeling better, no nausea or vomiting, no chest pain, positive dyspnea on exertion that has been improving.   Objective: Vitals:   09/10/17 2031 09/11/17 0000 09/11/17 0200 09/11/17 0325  BP: 120/60 120/70 120/60 (!) 128/59  Pulse: 82 82 82 88  Resp:    18  Temp:    99.4 F (37.4 C)  TempSrc:    Oral  SpO2: 98% 98% 98% 98%  Weight:    (!) 141.7 kg (312 lb 4.8 oz)  Height:        Intake/Output Summary (Last 24 hours) at 09/11/17 1234 Last data filed at 09/11/17 0900  Gross per 24 hour  Intake  940 ml  Output             2700 ml  Net            -1760 ml   Filed Weights   09/09/17 0606 09/10/17 0613 09/11/17 0325  Weight: (!) 142 kg (313 lb) (!) 142.5 kg (314 lb 1.6 oz) (!)  141.7 kg (312 lb 4.8 oz)    Examination:  General: Not in pain or dyspnea, deconditioned Neurology: Awake and alert, non focal  E ENT: mild pallor, no icterus, oral mucosa moist Cardiovascular: No JVD. S1-S2 present, rhythmic, no gallops, rubs, or murmurs. Trace lower extremity edema. Pulmonary: vesicular breath sounds bilaterally, adequate air movement, no wheezing, rhonchi or rales. Gastrointestinal. Abdomen flat, no organomegaly, non tender, no rebound or guarding Skin. No rashes Musculoskeletal: no joint deformities     Data Reviewed: I have personally reviewed following labs and imaging studies  CBC:  Recent Labs Lab 09/05/17 2012 09/06/17 0201 09/10/17 0516 09/10/17 0757 09/11/17 0549  WBC 7.1 7.6 6.7 6.6 7.3  NEUTROABS 3.5 4.0  --   --   --   HGB 12.3 11.8* 10.6* 10.8* 10.5*  HCT 38.7 36.0 34.3* 35.4* 33.7*  MCV 82.0 82.2 84.1 83.7 83.2  PLT 229 223 218 226 782   Basic Metabolic Panel:  Recent Labs Lab 09/06/17 0201 09/07/17 0633 09/09/17 0239 09/10/17 0516 09/10/17 0757 09/11/17 0549  NA 138 141 137 135  --  136  K 3.5 4.1 4.1 4.3  --  4.8  CL 102 101 100* 100*  --  101  CO2 26 29 31 25   --  28  GLUCOSE 192* 154* 161* 180*  --  114*  BUN 17 34* 34* 35*  --  30*  CREATININE 1.15* 1.57* 1.49* 1.24* 1.25* 1.28*  CALCIUM 8.5* 8.6* 8.6* 8.5*  --  8.5*   GFR: Estimated Creatinine Clearance: 67.9 mL/min (A) (by C-G formula based on SCr of 1.28 mg/dL (H)). Liver Function Tests:  Recent Labs Lab 09/05/17 2012  AST 36  ALT 34  ALKPHOS 151*  BILITOT 0.9  PROT 8.3*  ALBUMIN 3.7   No results for input(s): LIPASE, AMYLASE in the last 168 hours. No results for input(s): AMMONIA in the last 168 hours. Coagulation Profile:  Recent Labs Lab 09/10/17 0516 09/10/17 0757  INR 0.98 0.99   Cardiac Enzymes:  Recent Labs Lab 09/05/17 2012 09/06/17 0201 09/06/17 0841 09/06/17 1116  TROPONINI 0.05* 0.08* 0.07* 0.06*   BNP (last 3 results) No  results for input(s): PROBNP in the last 8760 hours. HbA1C: No results for input(s): HGBA1C in the last 72 hours. CBG:  Recent Labs Lab 09/10/17 1200 09/10/17 1726 09/10/17 2112 09/11/17 0750 09/11/17 1143  GLUCAP 195* 142* 164* 118* 165*   Lipid Profile:  Recent Labs  09/09/17 0239  CHOL 124  HDL 32*  LDLCALC 57  TRIG 177*  CHOLHDL 3.9   Thyroid Function Tests: No results for input(s): TSH, T4TOTAL, FREET4, T3FREE, THYROIDAB in the last 72 hours. Anemia Panel: No results for input(s): VITAMINB12, FOLATE, FERRITIN, TIBC, IRON, RETICCTPCT in the last 72 hours.    Radiology Studies: I have reviewed all of the imaging during this hospital visit personally     Scheduled Meds: . amLODipine  5 mg Oral Daily  . aspirin  81 mg Oral Daily  . atorvastatin  80 mg Oral q1800  . carvedilol  6.25 mg Oral BID WC  . enoxaparin (LOVENOX) injection  40 mg Subcutaneous Q24H  .  insulin aspart  0-20 Units Subcutaneous TID WC  . insulin aspart  0-5 Units Subcutaneous QHS  . isosorbide mononitrate  30 mg Oral Daily  . levothyroxine  225 mcg Oral QAC breakfast  . losartan  100 mg Oral Daily  . mouth rinse  15 mL Mouth Rinse BID  . polyethylene glycol  17 g Oral Daily  . senna-docusate  1 tablet Oral QHS  . sodium chloride flush  3 mL Intravenous Q12H  . sodium chloride flush  3 mL Intravenous Q12H   Continuous Infusions: . sodium chloride    . sodium chloride    . sodium chloride       LOS: 5 days        Tawni Millers, MD [facility] Pager [phone]

## 2017-09-11 NOTE — Progress Notes (Addendum)
Vitals stable, pt did not complain any pain but this am pt felt like nauseous, so Inj Zofran IV given, Morning EKG done, Pt's right radial site and right brachial site is clean, 0 level, no any bleeding and complications noted.will continue to monitor

## 2017-09-11 NOTE — Progress Notes (Signed)
Physical Therapy Treatment Patient Details Name: Maureen Jackson MRN: [id number] DOB: 04/18/1956 Today's Date: 09/11/2017    History of Present Illness Pt is a 61 y.o. female with history of OSA not on cpap, obestiry, HTN, DM2, and hypothyroidism. She was admitted with SOB and acute systolic heart failure.    PT Comments    Patient tolerated an increase in gait distance this session. Pt with 2/4 DOE and burning sensation in feet with ambulation. Pt will continue to benefit from further skilled PT while in acute setting to maximize independence and safety with mobility. Try rollator next session for energy conservation.     Follow Up Recommendations  No PT follow up;Supervision - Intermittent     Equipment Recommendations  None recommended by PT    Recommendations for Other Services       Precautions / Restrictions Precautions Precautions: None Restrictions Weight Bearing Restrictions: No    Mobility  Bed Mobility               General bed mobility comments: pt OOB in chair upon arrival  Transfers Overall transfer level: Modified independent Equipment used: None Transfers: Sit to/from [employer] transfer comment: increased effort  Ambulation/Gait Ambulation/Gait assistance: Supervision Ambulation Distance (Feet):  (26ft X2) Assistive device: None Gait Pattern/deviations: Step-through pattern;Wide base of support;Decreased stride length Gait velocity: decreased   General Gait Details: 2/4 DOE; one seated rest break due to fatigue and burning in feet   Stairs            Wheelchair Mobility    Modified Rankin (Stroke Patients Only)       Balance Overall balance assessment: No apparent balance deficits (not formally assessed)                                          Cognition Arousal/Alertness: Awake/alert Behavior During Therapy: WFL for tasks assessed/performed Overall Cognitive Status:  Within Functional Limits for tasks assessed                                        Exercises      General Comments        Pertinent Vitals/Pain Pain Assessment: Faces Faces Pain Scale: Hurts little more Pain Location: feet (L > R) Pain Descriptors / Indicators: Burning Pain Intervention(s): Limited activity within patient's tolerance;Monitored during session;Repositioned    Home Living                      Prior Function            PT Goals (current goals can now be found in the care plan section) Acute Rehab PT Goals Patient Stated Goal: home PT Goal Formulation: With patient Time For Goal Achievement: 09/23/17 Potential to Achieve Goals: Good Progress towards PT goals: Progressing toward goals    Frequency    Min 3X/week      PT Plan Current plan remains appropriate    Co-evaluation              AM-PAC PT "6 Clicks" Daily Activity  Outcome Measure  Difficulty turning over in bed (including adjusting bedclothes, sheets and blankets)?: A Little Difficulty moving from lying on back to sitting on the  side of the bed? : A Little Difficulty sitting down on and standing up from a chair with arms (e.g., wheelchair, bedside commode, etc,.)?: None Help needed moving to and from a bed to chair (including a wheelchair)?: None Help needed walking in hospital room?: None Help needed climbing 3-5 steps with a railing? : A Little 6 Click Score: 21    End of Session Equipment Utilized During Treatment: Gait belt Activity Tolerance: Patient tolerated treatment well Patient left: in chair;with call bell/phone within reach Nurse Communication: Mobility status PT Visit Diagnosis: Difficulty in walking, not elsewhere classified (R26.2)     Time: 6060-0459 PT Time Calculation (min) (ACUTE ONLY): 21 min  Charges:  $Gait Training: 8-22 mins                    G Codes:       Maureen Jackson, Maureen Jackson Pager: [phone]     Maureen Jackson 09/11/2017, 11:50 AM

## 2017-09-12 ENCOUNTER — Encounter (HOSPITAL_COMMUNITY)
Admission: EM | Disposition: A | Discharge status: Home / Self Care | Payer: Self-pay | Source: Home / Self Care | Attending: Internal Medicine

## 2017-09-12 ENCOUNTER — Encounter (HOSPITAL_COMMUNITY): Payer: Self-pay | Admitting: General Practice

## 2017-09-12 DIAGNOSIS — I2510 Atherosclerotic heart disease of native coronary artery without angina pectoris: Secondary | ICD-10-CM

## 2017-09-12 HISTORY — PX: CORONARY STENT INTERVENTION: CATH118234

## 2017-09-12 HISTORY — PX: LEFT HEART CATH AND CORONARY ANGIOGRAPHY: CATH118249

## 2017-09-12 HISTORY — PX: CARDIAC CATHETERIZATION: SHX172

## 2017-09-12 LAB — URINALYSIS, ROUTINE W REFLEX MICROSCOPIC
BILIRUBIN URINE: NEGATIVE
Glucose, UA: NEGATIVE mg/dL
Hgb urine dipstick: NEGATIVE
KETONES UR: NEGATIVE mg/dL
LEUKOCYTES UA: NEGATIVE
NITRITE: NEGATIVE
PH: 6 (ref 5.0–8.0)
PROTEIN: NEGATIVE mg/dL
Specific Gravity, Urine: 1.006 (ref 1.005–1.030)

## 2017-09-12 LAB — GLUCOSE, CAPILLARY
GLUCOSE-CAPILLARY: 160 mg/dL — AB (ref 65–99)
GLUCOSE-CAPILLARY: 137 mg/dL — AB (ref 65–99)
Glucose-Capillary: 165 mg/dL — ABNORMAL HIGH (ref 65–99)

## 2017-09-12 LAB — CBC
HCT: 32.1 % — ABNORMAL LOW (ref 36.0–46.0)
Hemoglobin: 10.1 g/dL — ABNORMAL LOW (ref 12.0–15.0)
MCH: 26.2 pg (ref 26.0–34.0)
MCHC: 31.5 g/dL (ref 30.0–36.0)
MCV: 83.4 fL (ref 78.0–100.0)
PLATELETS: 203 10*3/uL (ref 150–400)
RBC: 3.85 MIL/uL — AB (ref 3.87–5.11)
RDW: 14.6 % (ref 11.5–15.5)
WBC: 8.1 10*3/uL (ref 4.0–10.5)

## 2017-09-12 LAB — BASIC METABOLIC PANEL
Anion gap: 8 (ref 5–15)
BUN: 30 mg/dL — AB (ref 6–20)
CHLORIDE: 99 mmol/L — AB (ref 101–111)
CO2: 28 mmol/L (ref 22–32)
Calcium: 8.5 mg/dL — ABNORMAL LOW (ref 8.9–10.3)
Creatinine, Ser: 1.28 mg/dL — ABNORMAL HIGH (ref 0.44–1.00)
GFR calc Af Amer: 52 mL/min — ABNORMAL LOW (ref >60)
GFR calc non Af Amer: 45 mL/min — ABNORMAL LOW (ref >60)
GLUCOSE: 138 mg/dL — AB (ref 65–99)
POTASSIUM: 4.9 mmol/L (ref 3.5–5.1)
Sodium: 135 mmol/L (ref 135–145)

## 2017-09-12 LAB — PROTIME-INR
INR: 1.08
PROTHROMBIN TIME: 13.9 s (ref 11.4–15.2)

## 2017-09-12 LAB — POCT ACTIVATED CLOTTING TIME
ACTIVATED CLOTTING TIME: 246 s
ACTIVATED CLOTTING TIME: 224 s
ACTIVATED CLOTTING TIME: 307 s
ACTIVATED CLOTTING TIME: 307 s
Activated Clotting Time: 224 seconds
Activated Clotting Time: 208 seconds

## 2017-09-12 SURGERY — CORONARY STENT INTERVENTION
Anesthesia: LOCAL

## 2017-09-12 MED ORDER — SODIUM CHLORIDE 0.9 % IV SOLN: 250.0000 mL | INTRAVENOUS | Status: DC | PRN

## 2017-09-12 MED ORDER — MIDAZOLAM HCL 2 MG/2ML IJ SOLN
INTRAMUSCULAR | Status: AC
  Filled 2017-09-12: qty 2

## 2017-09-12 MED ORDER — VERAPAMIL HCL 2.5 MG/ML IV SOLN
INTRAVENOUS | Status: DC | PRN
  Administered 2017-09-12: 10 mL via INTRA_ARTERIAL

## 2017-09-12 MED ORDER — IOPAMIDOL (ISOVUE-370) INJECTION 76%
INTRAVENOUS | Status: AC
  Filled 2017-09-12: qty 100

## 2017-09-12 MED ORDER — HEPARIN SODIUM (PORCINE) 1000 UNIT/ML IJ SOLN
INTRAMUSCULAR | Status: AC
  Filled 2017-09-12: qty 1

## 2017-09-12 MED ORDER — MIDAZOLAM HCL 2 MG/2ML IJ SOLN
INTRAMUSCULAR | Status: DC | PRN
  Administered 2017-09-12 (×3): 1 mg via INTRAVENOUS

## 2017-09-12 MED ORDER — VERAPAMIL HCL 2.5 MG/ML IV SOLN
INTRAVENOUS | Status: AC
  Filled 2017-09-12: qty 2

## 2017-09-12 MED ORDER — SODIUM CHLORIDE 0.9% FLUSH
3.0000 mL | Freq: Two times a day (BID) | INTRAVENOUS | Status: DC
  Administered 2017-09-12 – 2017-09-13 (×2): 3 mL via INTRAVENOUS

## 2017-09-12 MED ORDER — HEPARIN (PORCINE) IN NACL 2-0.9 UNIT/ML-% IJ SOLN
INTRAMUSCULAR | Status: DC | PRN
  Administered 2017-09-12: 1500 mL

## 2017-09-12 MED ORDER — HEPARIN (PORCINE) IN NACL 2-0.9 UNIT/ML-% IJ SOLN
INTRAMUSCULAR | Status: AC
  Filled 2017-09-12: qty 500

## 2017-09-12 MED ORDER — LABETALOL HCL 5 MG/ML IV SOLN: 10.0000 mg | INTRAVENOUS | Status: AC | PRN

## 2017-09-12 MED ORDER — LIDOCAINE HCL (PF) 1 % IJ SOLN
INTRAMUSCULAR | Status: DC | PRN
  Administered 2017-09-12: 2 mL via SUBCUTANEOUS

## 2017-09-12 MED ORDER — HEPARIN SODIUM (PORCINE) 1000 UNIT/ML IJ SOLN
INTRAMUSCULAR | Status: DC | PRN
  Administered 2017-09-12: 4000 [IU] via INTRAVENOUS
  Administered 2017-09-12: 5000 [IU] via INTRAVENOUS
  Administered 2017-09-12: 4000 [IU] via INTRAVENOUS
  Administered 2017-09-12: 10000 [IU] via INTRAVENOUS

## 2017-09-12 MED ORDER — FENTANYL CITRATE (PF) 100 MCG/2ML IJ SOLN
INTRAMUSCULAR | Status: DC | PRN
  Administered 2017-09-12 (×3): 25 ug via INTRAVENOUS

## 2017-09-12 MED ORDER — HEPARIN (PORCINE) IN NACL 2-0.9 UNIT/ML-% IJ SOLN
INTRAMUSCULAR | Status: AC
  Filled 2017-09-12: qty 1000

## 2017-09-12 MED ORDER — IOPAMIDOL (ISOVUE-370) INJECTION 76%
INTRAVENOUS | Status: AC
  Filled 2017-09-12: qty 125

## 2017-09-12 MED ORDER — IOPAMIDOL (ISOVUE-370) INJECTION 76%
INTRAVENOUS | Status: DC | PRN
  Administered 2017-09-12: 185 mL via INTRAVENOUS

## 2017-09-12 MED ORDER — CLOPIDOGREL BISULFATE 75 MG PO TABS
75.0000 mg | ORAL_TABLET | Freq: Every day | ORAL | Status: DC
  Administered 2017-09-13 – 2017-09-14 (×2): 75 mg via ORAL
  Filled 2017-09-12 (×2): qty 1

## 2017-09-12 MED ORDER — NITROGLYCERIN 1 MG/10 ML FOR IR/CATH LAB
INTRA_ARTERIAL | Status: AC
  Filled 2017-09-12: qty 10

## 2017-09-12 MED ORDER — SODIUM CHLORIDE 0.9 % IV SOLN: INTRAVENOUS | Status: AC

## 2017-09-12 MED ORDER — SODIUM CHLORIDE 0.9% FLUSH: 3.0000 mL | INTRAVENOUS | Status: DC | PRN

## 2017-09-12 MED ORDER — FENTANYL CITRATE (PF) 100 MCG/2ML IJ SOLN
INTRAMUSCULAR | Status: AC
  Filled 2017-09-12: qty 2

## 2017-09-12 SURGICAL SUPPLY — 28 items
BALLN EUPHORA RX 2.5X20 (BALLOONS) ×2
BALLN ~~LOC~~ EUPHORA RX 3.0X15 (BALLOONS) ×2
BALLN ~~LOC~~ EUPHORA RX 3.25X27 (BALLOONS) ×2
BALLN ~~LOC~~ EUPHORA RX 3.5X15 (BALLOONS) ×4
BALLOON EUPHORA RX 2.5X20 (BALLOONS) IMPLANT
BALLOON ~~LOC~~ EUPHORA RX 3.0X15 (BALLOONS) IMPLANT
BALLOON ~~LOC~~ EUPHORA RX 3.25X27 (BALLOONS) IMPLANT
BALLOON ~~LOC~~ EUPHORA RX 3.5X15 (BALLOONS) IMPLANT
CATH HEARTRAIL 6F IR1.5 (CATHETERS) ×1 IMPLANT
CATH INFINITI JR4 5F (CATHETERS) ×1 IMPLANT
CATH VISTA GUIDE 6FR XBLAD3.5 (CATHETERS) ×1 IMPLANT
COVER PRB 48X5XTLSCP FOLD TPE (BAG) IMPLANT
COVER PROBE 5X48 (BAG) ×2
DEVICE RAD COMP TR BAND LRG (VASCULAR PRODUCTS) ×1 IMPLANT
GLIDESHEATH SLEND A-KIT 6F 22G (SHEATH) ×1 IMPLANT
GLIDESHEATH SLEND SS 6F .021 (SHEATH) ×1 IMPLANT
GUIDEWIRE INQWIRE 1.5J.035X260 (WIRE) IMPLANT
INQWIRE 1.5J .035X260CM (WIRE) ×2
KIT ENCORE 26 ADVANTAGE (KITS) ×3 IMPLANT
KIT HEART LEFT (KITS) ×2 IMPLANT
PACK CARDIAC CATHETERIZATION (CUSTOM PROCEDURE TRAY) ×2 IMPLANT
STENT RESOLUTE ONYX 2.75X26 (Permanent Stent) ×1 IMPLANT
STENT RESOLUTE ONYX 2.75X38 (Permanent Stent) ×2 IMPLANT
TRANSDUCER W/STOPCOCK (MISCELLANEOUS) ×2 IMPLANT
TUBING CIL FLEX 10 FLL-RA (TUBING) ×2 IMPLANT
VALVE GUARDIAN II ~~LOC~~ HEMO (MISCELLANEOUS) ×1 IMPLANT
WIRE ASAHI PROWATER 180CM (WIRE) ×1 IMPLANT
WIRE MARVEL STR TIP 190CM (WIRE) ×1 IMPLANT

## 2017-09-12 NOTE — Progress Notes (Signed)
TR BAND REMOVAL  LOCATION:    right radial  DEFLATED PER PROTOCOL:    Yes.    TIME BAND OFF / DRESSING APPLIED:    1630   SITE UPON ARRIVAL:    Level 0  SITE AFTER BAND REMOVAL:    Level 0  CIRCULATION SENSATION AND MOVEMENT:    Within Normal Limits   Yes.    COMMENTS:   TOLERATED PROCEDURE WELL

## 2017-09-12 NOTE — Care Management Note (Addendum)
Case Management Note  Patient Details  Name: Maureen Jackson MRN: [id number] Date of Birth: 14-Dec-1955  Subjective/Objective:   Transfer from [facility] for heart cath, s/p coronary stent intervention, will be on plavix and asa.  Patient is form home alone , has no DME,per previous NCM note from [facility], patient would like a cane and she does not have a scale to weigh on. Patient may benefit from pt/ot eval.   The heart failure clinic will order a scale for patient and call her at home to have someone pick it up when it comes in.  RN will get order for cane for patient.                 Action/Plan: NCM will follow for dc needs.   Expected Discharge Date:                  Expected Discharge Plan:  Maureen Jackson  In-House Referral:     Discharge planning Services  CM Consult  Post Acute Care Choice:  Durable Medical Equipment, Home Health Choice offered to:  Patient  DME Arranged:    DME Agency:     HH Arranged:    [place] Agency:     Status of Service:  In process, will continue to follow  If discussed at Long Length of Stay Meetings, dates discussed:    Additional Comments:  Zenon Mayo, RN 09/12/2017, 3:03 PM

## 2017-09-12 NOTE — Progress Notes (Signed)
Patient with no complaints or concerns during 7pm - 7am shift. Slept during the night. Patient has been NPO after midnight for procedure today.  Will continue to monitor.  Sya Nestler, RN

## 2017-09-12 NOTE — Progress Notes (Addendum)
PROGRESS NOTE        PATIENT DETAILS Name: Maureen Jackson Age: 61 y.o. Sex: female Date of Birth: 1956/10/02 Admit Date: 09/05/2017 Admitting Physician Karmen Bongo, MD MEB:[id number], No Pcp Per  Brief Narrative: Patient is a 61 y.o. female with history of hypertension, DM-2, hypothyroidism, admitted with new onset systolic heart failure, further evaluation revealed multi vessel CAD, evaluated by cardiothoracic surgery, and deemed not to be a candidate for CABG, underwent PCI on 10/3. Please see below for further details  Subjective: Eating breakfast-no complaints this morning.  Assessment/Plan: Acute systolic and diastolic heart failure: Volume status stable, diuretics held with increasing creatinine and anticipation of PCI-reassess volume status tomorrow, and resume accordingly. Continue Coreg and losartan for now.  Multivessel CAD: Assessed by cardiothoracic surgery-not deemed to be a candidate for CABG, subsequently underwent PCI to LAD and RCA lesion, per cardiology-circumflex has diffuse disease-and plans are to treat this medically. Continue aspirin, Plavix, Coreg, losartan and statin.  Mild acute kidney injury: Creatinine appears stable, and reassess tomorrow  Pericardial effusion: Seen on echo on 10/1-no signs of tamponade, will require a repeat echocardiogram short-term follow-up.  Type II DM: CBGs stable, continue SSI-resume glimepiride and metformin on discharge.  Dyslipidemia: Previously on Zocor-switched to high intensity Lipitor post PCI  Hypothyroidism: Continue with  Morbid obesity: Counseled regarding importance of weight loss  OSA: Noncompliant to CPAP in the outpatient setting  Morning labs/Imaging ordered: yes  DVT Prophylaxis: Prophylactic Lovenox   Code Status: Full code  Family Communication: Family member at bedside  Disposition Plan: Remain inpatient-home soon-once cleared by cards  Antimicrobial  agents: Anti-infectives    None      Procedures: 10/3>>PCI to LAD and RCA  09/10/17>>Right and left heart cath   Prox RCA lesion, 30 %stenosed.  Prox RCA to Mid RCA lesion, 70 %stenosed.  Mid RCA lesion, 90 %stenosed.  RPDA lesion, 80 %stenosed.  Prox Cx lesion, 20 %stenosed.  Ost 1st Mrg lesion, 80 %stenosed.  1st Mrg-2 lesion, 80 %stenosed.  1st Mrg-1 lesion, 90 %stenosed.  Mid Cx-2 lesion, 50 %stenosed.  Mid Cx-1 lesion, 60 %stenosed.  Prox LAD lesion, 60 %stenosed.  10/1>>TEE:   -This was a limited study for pericardial effusion evaluation.   When compared to the prior study from 09/06/2017 there is   unchanged amount of moderate circumferential pericardial effusion   - 2 cm around the right ventricle and posteriorly.   There is right atrial invagination and but complete RV filling in   diastole. IVC is collapsing.   No signs of tamponade.   A follow up study in 1 week is recommended  CONSULTS:  cardiology and CTVS  Time spent: 25- minutes-Greater than 50% of this time was spent in counseling, explanation of diagnosis, planning of further management, and coordination of care.  MEDICATIONS: Scheduled Meds: . amLODipine  5 mg Oral Daily  . aspirin  81 mg Oral Daily  . atorvastatin  80 mg Oral q1800  . carvedilol  6.25 mg Oral BID WC  . [START ON 09/13/2017] clopidogrel  75 mg Oral Q breakfast  . enoxaparin (LOVENOX) injection  40 mg Subcutaneous Q24H  . insulin aspart  0-20 Units Subcutaneous TID WC  . insulin aspart  0-5 Units Subcutaneous QHS  . isosorbide mononitrate  30 mg Oral Daily  . levothyroxine  225 mcg Oral QAC breakfast  .  losartan  100 mg Oral Daily  . mouth rinse  15 mL Mouth Rinse BID  . polyethylene glycol  17 g Oral Daily  . senna-docusate  1 tablet Oral QHS  . sodium chloride flush  3 mL Intravenous Q12H  . sodium chloride flush  3 mL Intravenous Q12H  . sodium chloride flush  3 mL Intravenous Q12H   Continuous Infusions: . sodium  chloride    . sodium chloride    . sodium chloride    . sodium chloride 125 mL/hr at 09/12/17 1315  . sodium chloride     PRN Meds:.sodium chloride, sodium chloride, sodium chloride, acetaminophen, alum & mag hydroxide-simeth, hydrALAZINE, labetalol, magnesium hydroxide, ondansetron (ZOFRAN) IV, sodium chloride flush, sodium chloride flush, sodium chloride flush   PHYSICAL EXAM: Vital signs: Vitals:   09/12/17 1235 09/12/17 1239 09/12/17 1318 09/12/17 1500  BP: (!) 158/85  115/73 107/61  Pulse: 83 (!) 0 79 81  Resp: (!) 22 (!) 0 18 18  Temp:   98.6 F (37 C) 98.7 F (37.1 C)  TempSrc:   Oral Oral  SpO2: 96% (!) 0% 90% 95%  Weight:      Height:       Filed Weights   09/10/17 0613 09/11/17 0325 09/12/17 0555  Weight: (!) 142.5 kg (314 lb 1.6 oz) (!) 141.7 kg (312 lb 4.8 oz) (!) 141.8 kg (312 lb 11.2 oz)   Body mass index is 52.04 kg/m.   General appearance :Awake, alert, not in any distress.  Eyes:, pupils equally reactive to light and accomodation,no scleral icterus.Pink conjunctiva HEENT: Atraumatic and Normocephalic Neck: supple, no JVD. No cervical lymphadenopathy. No thyromegaly Resp:Good air entry bilaterally, no added sounds  CVS: S1 S2 regular, no murmurs.  GI: Bowel sounds present, Non tender and not distended with no gaurding, rigidity or rebound. Extremities: B/L Lower Ext shows no edema, both legs are warm to touch Neurology:  speech clear,Non focal, sensation is grossly intact. Psychiatric: Normal judgment and insight. Alert and oriented x 3.  Musculoskeletal:No digital cyanosis Skin:No Rash, warm and dry Wounds:N/A  I have personally reviewed following labs and imaging studies  LABORATORY DATA: CBC:  Recent Labs Lab 09/05/17 2012 09/06/17 0201 09/10/17 0516 09/10/17 0757 09/11/17 0549 09/12/17 0401  WBC 7.1 7.6 6.7 6.6 7.3 8.1  NEUTROABS 3.5 4.0  --   --   --   --   HGB 12.3 11.8* 10.6* 10.8* 10.5* 10.1*  HCT 38.7 36.0 34.3* 35.4* 33.7* 32.1*    MCV 82.0 82.2 84.1 83.7 83.2 83.4  PLT 229 223 218 226 214 546    Basic Metabolic Panel:  Recent Labs Lab 09/07/17 0633 09/09/17 0239 09/10/17 0516 09/10/17 0757 09/11/17 0549 09/12/17 0401  NA 141 137 135  --  136 135  K 4.1 4.1 4.3  --  4.8 4.9  CL 101 100* 100*  --  101 99*  CO2 29 31 25   --  28 28  GLUCOSE 154* 161* 180*  --  114* 138*  BUN 34* 34* 35*  --  30* 30*  CREATININE 1.57* 1.49* 1.24* 1.25* 1.28* 1.28*  CALCIUM 8.6* 8.6* 8.5*  --  8.5* 8.5*    GFR: Estimated Creatinine Clearance: 67.9 mL/min (A) (by C-G formula based on SCr of 1.28 mg/dL (H)).  Liver Function Tests:  Recent Labs Lab 09/05/17 2012  AST 36  ALT 34  ALKPHOS 151*  BILITOT 0.9  PROT 8.3*  ALBUMIN 3.7   No results for input(s): LIPASE, AMYLASE  in the last 168 hours. No results for input(s): AMMONIA in the last 168 hours.  Coagulation Profile:  Recent Labs Lab 09/10/17 0516 09/10/17 0757 09/12/17 0401  INR 0.98 0.99 1.08    Cardiac Enzymes:  Recent Labs Lab 09/05/17 2012 09/06/17 0201 09/06/17 0841 09/06/17 1116  TROPONINI 0.05* 0.08* 0.07* 0.06*    BNP (last 3 results) No results for input(s): PROBNP in the last 8760 hours.  HbA1C: No results for input(s): HGBA1C in the last 72 hours.  CBG:  Recent Labs Lab 09/11/17 1143 09/11/17 1555 09/11/17 2141 09/12/17 0753 09/12/17 1653  GLUCAP 165* 199* 138* 160* 137*    Lipid Profile: No results for input(s): CHOL, HDL, LDLCALC, TRIG, CHOLHDL, LDLDIRECT in the last 72 hours.  Thyroid Function Tests: No results for input(s): TSH, T4TOTAL, FREET4, T3FREE, THYROIDAB in the last 72 hours.  Anemia Panel: No results for input(s): VITAMINB12, FOLATE, FERRITIN, TIBC, IRON, RETICCTPCT in the last 72 hours.  Urine analysis:    Component Value Date/Time   COLORURINE STRAW (A) 09/12/2017 0959   APPEARANCEUR CLEAR 09/12/2017 0959   LABSPEC 1.006 09/12/2017 0959   PHURINE 6.0 09/12/2017 0959   GLUCOSEU NEGATIVE  09/12/2017 0959   HGBUR NEGATIVE 09/12/2017 0959   BILIRUBINUR NEGATIVE 09/12/2017 0959   KETONESUR NEGATIVE 09/12/2017 0959   PROTEINUR NEGATIVE 09/12/2017 0959   UROBILINOGEN 0.2 10/02/2015 2320   NITRITE NEGATIVE 09/12/2017 0959   LEUKOCYTESUR NEGATIVE 09/12/2017 0959    Sepsis Labs: Lactic Acid, Venous No results found for: LATICACIDVEN  MICROBIOLOGY: No results found for this or any previous visit (from the past 240 hour(s)).  RADIOLOGY STUDIES/RESULTS: Dg Chest Port 1 View  Result Date: 09/05/2017 CLINICAL DATA:  One-week history of cough. Acute onset of shortness of breath and chills that began this evening. Current history of diabetes and hypertension. EXAM: PORTABLE CHEST 1 VIEW COMPARISON:  03/08/2010, 05/07/2009 and earlier. FINDINGS: Massive cardiac enlargement, significantly increased in size since 2011. Pulmonary venous hypertension and mild diffuse interstitial pulmonary edema. No visible pleural effusions. IMPRESSION: Mild CHF, with massive cardiomegaly and mild diffuse interstitial pulmonary edema. The heart has significantly increased in size since 2011. Electronically Signed   By: Evangeline Dakin M.D.   On: 09/05/2017 20:31     LOS: 6 days   Oren Binet, MD  [facility] Pager:[phone]  If 7PM-7AM, please contact night-coverage [email or web address] Password [email or web address] 09/12/2017, 5:16 PM

## 2017-09-12 NOTE — Progress Notes (Signed)
PT Cancellation Note  Patient Details Name: Maureen Jackson MRN: [id number] DOB: 10-14-1956   Cancelled Treatment:    Reason Eval/Treat Not Completed: Patient at procedure or test/unavailable.  Patient in cath lab.   Despina Pole 09/12/2017, 10:17 AM Carita Pian. Sanjuana Kava, [place] Pager [phone]

## 2017-09-12 NOTE — Progress Notes (Signed)
DAILY PROGRESS NOTE   Patient Name: Maureen Jackson Date of Encounter: 09/12/2017  Hospital Problem List   Principal Problem:   Acute combined systolic (congestive) and diastolic (congestive) heart failure (HCC) Active Problems:   Hypothyroidism   Essential hypertension   Sleep apnea   Morbid obesity ([place])   Diabetes mellitus type 2 with complications, uncontrolled (HCC)   Pericardial effusion   AKI (acute kidney injury) ([place])   Elevated troponin I level   CAD, multiple vessel    Chief Complaint   Nausea, fatigue  Subjective   No events overnight. Discussed case with Dr. Ellyn Hack yesterday and we both reviewed her cath films personally. Since she is not a CABG candidate, plan is to pursue PCI today to the RCA and LAD - there is diffuse LCx disease, which will probably be treated medically.  Objective   Vitals:   09/11/17 1234 09/11/17 2144 09/12/17 0555 09/12/17 0758  BP: 138/79 127/66 (!) 141/72 138/71  Pulse: 86 82 82 83  Resp: 18 17 18 20   Temp: 98.4 F (36.9 C) 99 F (37.2 C) 98.7 F (37.1 C) 99.2 F (37.3 C)  TempSrc: Oral Oral Oral Oral  SpO2: 96% 99% 100% 98%  Weight:   (!) 312 lb 11.2 oz (141.8 kg)   Height:        Intake/Output Summary (Last 24 hours) at 09/12/17 0839 Last data filed at 09/12/17 3299  Gross per 24 hour  Intake           745.83 ml  Output             1051 ml  Net          -305.17 ml   Filed Weights   09/10/17 0613 09/11/17 0325 09/12/17 0555  Weight: (!) 314 lb 1.6 oz (142.5 kg) (!) 312 lb 4.8 oz (141.7 kg) (!) 312 lb 11.2 oz (141.8 kg)    Physical Exam   General appearance: alert, no distress and morbidly obese Neck: no carotid bruit, no JVD and thyroid not enlarged, symmetric, no tenderness/mass/nodules Lungs: diminished breath sounds bilaterally Heart: regular rate and rhythm Abdomen: soft, non-tender; bowel sounds normal; no masses,  no organomegaly Extremities: extremities normal, atraumatic, no cyanosis or  edema Pulses: 2+ and symmetric Skin: Skin color, texture, turgor normal. No rashes or lesions Neurologic: Grossly normal Psych: Pleasant  Inpatient Medications    Scheduled Meds: . amLODipine  5 mg Oral Daily  . aspirin  81 mg Oral Daily  . atorvastatin  80 mg Oral q1800  . carvedilol  6.25 mg Oral BID WC  . clopidogrel  300 mg Oral Once  . enoxaparin (LOVENOX) injection  40 mg Subcutaneous Q24H  . insulin aspart  0-20 Units Subcutaneous TID WC  . insulin aspart  0-5 Units Subcutaneous QHS  . isosorbide mononitrate  30 mg Oral Daily  . levothyroxine  225 mcg Oral QAC breakfast  . losartan  100 mg Oral Daily  . mouth rinse  15 mL Mouth Rinse BID  . polyethylene glycol  17 g Oral Daily  . senna-docusate  1 tablet Oral QHS  . sodium chloride flush  3 mL Intravenous Q12H  . sodium chloride flush  3 mL Intravenous Q12H  . sodium chloride flush  3 mL Intravenous Q12H    Continuous Infusions: . sodium chloride    . sodium chloride    . sodium chloride    . sodium chloride    . sodium chloride 10 mL/hr at 09/12/17  0645    PRN Meds: sodium chloride, sodium chloride, sodium chloride, acetaminophen, alum & mag hydroxide-simeth, hydrALAZINE, magnesium hydroxide, ondansetron (ZOFRAN) IV, sodium chloride flush, sodium chloride flush, sodium chloride flush   Labs   Results for orders placed or performed during the hospital encounter of 09/05/17 (from the past 48 hour(s))  Glucose, capillary     Status: Abnormal   Collection Time: 09/10/17 12:00 PM  Result Value Ref Range   Glucose-Capillary 195 (H) 65 - 99 mg/dL  I-STAT 3, venous blood gas (G3P V)     Status: Abnormal   Collection Time: 09/10/17  1:28 PM  Result Value Ref Range   pH, Ven 7.352 7.250 - 7.430   pCO2, Ven 57.6 44.0 - 60.0 mmHg   pO2, Ven 37.0 32.0 - 45.0 mmHg   Bicarbonate 32.0 (H) 20.0 - 28.0 mmol/L   TCO2 34 (H) 22 - 32 mmol/L   O2 Saturation 65.0 %   Acid-Base Excess 5.0 (H) 0.0 - 2.0 mmol/L   Patient  temperature HIDE    Sample type VENOUS    Comment NOTIFIED PHYSICIAN   I-STAT 3, arterial blood gas (G3+)     Status: Abnormal   Collection Time: 09/10/17  1:45 PM  Result Value Ref Range   pH, Arterial 7.384 7.350 - 7.450   pCO2 arterial 52.0 (H) 32.0 - 48.0 mmHg   pO2, Arterial 122.0 (H) 83.0 - 108.0 mmHg   Bicarbonate 31.0 (H) 20.0 - 28.0 mmol/L   TCO2 33 (H) 22 - 32 mmol/L   O2 Saturation 99.0 %   Acid-Base Excess 5.0 (H) 0.0 - 2.0 mmol/L   Patient temperature HIDE    Sample type ARTERIAL   Glucose, capillary     Status: Abnormal   Collection Time: 09/10/17  5:26 PM  Result Value Ref Range   Glucose-Capillary 142 (H) 65 - 99 mg/dL  Glucose, capillary     Status: Abnormal   Collection Time: 09/10/17  9:12 PM  Result Value Ref Range   Glucose-Capillary 164 (H) 65 - 99 mg/dL  Basic metabolic panel     Status: Abnormal   Collection Time: 09/11/17  5:49 AM  Result Value Ref Range   Sodium 136 135 - 145 mmol/L   Potassium 4.8 3.5 - 5.1 mmol/L   Chloride 101 101 - 111 mmol/L   CO2 28 22 - 32 mmol/L   Glucose, Bld 114 (H) 65 - 99 mg/dL   BUN 30 (H) 6 - 20 mg/dL   Creatinine, Ser 1.28 (H) 0.44 - 1.00 mg/dL   Calcium 8.5 (L) 8.9 - 10.3 mg/dL   GFR calc non Af Amer 45 (L) >60 mL/min   GFR calc Af Amer 52 (L) >60 mL/min    Comment: (NOTE) The eGFR has been calculated using the CKD EPI equation. This calculation has not been validated in all clinical situations. eGFR's persistently <60 mL/min signify possible Chronic Kidney Disease.    Anion gap 7 5 - 15  CBC     Status: Abnormal   Collection Time: 09/11/17  5:49 AM  Result Value Ref Range   WBC 7.3 4.0 - 10.5 K/uL   RBC 4.05 3.87 - 5.11 MIL/uL   Hemoglobin 10.5 (L) 12.0 - 15.0 g/dL   HCT 33.7 (L) 36.0 - 46.0 %   MCV 83.2 78.0 - 100.0 fL   MCH 25.9 (L) 26.0 - 34.0 pg   MCHC 31.2 30.0 - 36.0 g/dL   RDW 14.3 11.5 - 15.5 %   Platelets  214 150 - 400 K/uL  Glucose, capillary     Status: Abnormal   Collection Time: 09/11/17   7:50 AM  Result Value Ref Range   Glucose-Capillary 118 (H) 65 - 99 mg/dL   Comment 1 Notify RN    Comment 2 Document in Chart   Glucose, capillary     Status: Abnormal   Collection Time: 09/11/17 11:43 AM  Result Value Ref Range   Glucose-Capillary 165 (H) 65 - 99 mg/dL   Comment 1 Notify RN    Comment 2 Document in Chart   Glucose, capillary     Status: Abnormal   Collection Time: 09/11/17  3:55 PM  Result Value Ref Range   Glucose-Capillary 199 (H) 65 - 99 mg/dL   Comment 1 Notify RN   Glucose, capillary     Status: Abnormal   Collection Time: 09/11/17  9:41 PM  Result Value Ref Range   Glucose-Capillary 138 (H) 65 - 99 mg/dL   Comment 1 Notify RN    Comment 2 Document in Chart   Basic metabolic panel     Status: Abnormal   Collection Time: 09/12/17  4:01 AM  Result Value Ref Range   Sodium 135 135 - 145 mmol/L   Potassium 4.9 3.5 - 5.1 mmol/L   Chloride 99 (L) 101 - 111 mmol/L   CO2 28 22 - 32 mmol/L   Glucose, Bld 138 (H) 65 - 99 mg/dL   BUN 30 (H) 6 - 20 mg/dL   Creatinine, Ser 1.28 (H) 0.44 - 1.00 mg/dL   Calcium 8.5 (L) 8.9 - 10.3 mg/dL   GFR calc non Af Amer 45 (L) >60 mL/min   GFR calc Af Amer 52 (L) >60 mL/min    Comment: (NOTE) The eGFR has been calculated using the CKD EPI equation. This calculation has not been validated in all clinical situations. eGFR's persistently <60 mL/min signify possible Chronic Kidney Disease.    Anion gap 8 5 - 15  Protime-INR     Status: None   Collection Time: 09/12/17  4:01 AM  Result Value Ref Range   Prothrombin Time 13.9 11.4 - 15.2 seconds   INR 1.08   CBC     Status: Abnormal   Collection Time: 09/12/17  4:01 AM  Result Value Ref Range   WBC 8.1 4.0 - 10.5 K/uL   RBC 3.85 (L) 3.87 - 5.11 MIL/uL   Hemoglobin 10.1 (L) 12.0 - 15.0 g/dL   HCT 32.1 (L) 36.0 - 46.0 %   MCV 83.4 78.0 - 100.0 fL   MCH 26.2 26.0 - 34.0 pg   MCHC 31.5 30.0 - 36.0 g/dL   RDW 14.6 11.5 - 15.5 %   Platelets 203 150 - 400 K/uL  Glucose,  capillary     Status: Abnormal   Collection Time: 09/12/17  7:53 AM  Result Value Ref Range   Glucose-Capillary 160 (H) 65 - 99 mg/dL    ECG   N/A  Telemetry   NSR with LBBB - Personally Reviewed  Radiology    No results found.  Cardiac Studies   N/A  Assessment   1. Principal Problem: 2.   Acute combined systolic (congestive) and diastolic (congestive) heart failure ([place]) 3. Active Problems: 4.   Hypothyroidism 5.   Essential hypertension 6.   Sleep apnea 7.   Morbid obesity ([place]) 8.   Diabetes mellitus type 2 with complications, uncontrolled ([place]) 9.   Pericardial effusion 10.   AKI (acute kidney injury) ([place])  11.   Elevated troponin I level 12.   CAD, multiple vessel 13.   Plan   1. Plan for multivessel PCI Today with Dr. Ellyn Hack. No questions about the procedure. Creatinine has been stable.  Time Spent Directly with Patient:  I have spent a total of 15 minutes with the patient reviewing hospital notes, telemetry, EKGs, labs and examining the patient as well as establishing an assessment and plan that was discussed personally with the patient. > 50% of time was spent in direct patient care.  Length of Stay:  LOS: 6 days   Pixie Casino, MD, [place]  Attending Cardiologist  Direct Dial: [phone]  Fax: [phone]  Website:  [email or web address] [email or web address] Dyke Weible 09/12/2017, 8:39 AM

## 2017-09-12 NOTE — Interval H&P Note (Signed)
History and Physical Interval Note:  09/12/2017 10:11 AM  Maureen Jackson  has presented today for surgery, with the diagnosis of Multivessel CAD & Ischemic Cardiomyopathy- turned down for CABG.   The various methods of treatment have been discussed with the patient and family. After consideration of risks, benefits and other options for treatment, the patient has consented to  Procedure(s): CORONARY STENT INTERVENTION (N/A) as a surgical intervention .  The patient's history has been reviewed, patient examined, no change in status, stable for surgery.  I have reviewed the patient's chart and labs.  Questions were answered to the patient's satisfaction.    Cath Lab Visit (complete for each Cath Lab visit)  Clinical Evaluation Leading to the Procedure:   ACS: Yes.   - with acute CHF.  Non-ACS:    Anginal Classification: CCS III  Anti-ischemic medical therapy: Maximal Therapy (2 or more classes of medications)  Non-Invasive Test Results: No non-invasive testing performed  Prior CABG: No previous CABG - turned down for CABG.   Glenetta Hew

## 2017-09-12 NOTE — H&P (View-Only) (Signed)
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 Discussed case with Dr. Ellyn Hack yesterday and we both reviewed her cath films personally. Since she is not a CABG candidate, plan is to pursue PCI today to the RCA and LAD - there is diffuse LCx disease, which will probably be treated medically.  Objective   Vitals:   09/11/17 1234 09/11/17 2144 09/12/17 0555 09/12/17 0758  BP: 138/79 127/66 (!) 141/72 138/71  Pulse: 86 82 82 83  Resp: 18 17 18 20   Temp: 98.4 F (36.9 C) 99 F (37.2 C) 98.7 F (37.1 C) 99.2 F (37.3 C)  TempSrc: Oral Oral Oral Oral  SpO2: 96% 99% 100% 98%  Weight:   (!) 312 lb 11.2 oz (141.8 kg)   Height:        Intake/Output Summary (Last 24 hours) at 09/12/17 0839 Last data filed at 09/12/17 1103  Gross per 24 hour  Intake           745.83 ml  Output             1051 ml  Net          -305.17 ml   Filed Weights   09/10/17 0613 09/11/17 0325 09/12/17 0555  Weight: (!) 314 lb 1.6 oz (142.5 kg) (!) 312 lb 4.8 oz (141.7 kg) (!) 312 lb 11.2 oz (141.8 kg)    Physical Exam   General appearance: alert, no distress and morbidly obese Neck: no carotid bruit, no JVD and thyroid not enlarged, symmetric, no tenderness/mass/nodules Lungs: diminished breath sounds bilaterally Heart: regular rate and rhythm Abdomen: soft, non-tender; bowel sounds normal; no masses,  no organomegaly Extremities: extremities normal, atraumatic, no cyanosis or  edema Pulses: 2+ and symmetric Skin: Skin color, texture, turgor normal. No rashes or lesions Neurologic: Grossly normal Psych: Pleasant  Inpatient Medications    Scheduled Meds: . amLODipine  5 mg Oral Daily  . aspirin  81 mg Oral Daily  . atorvastatin  80 mg Oral q1800  . carvedilol  6.25 mg Oral BID WC  . clopidogrel  300 mg Oral Once  . enoxaparin (LOVENOX) injection  40 mg Subcutaneous Q24H  . insulin aspart  0-20 Units Subcutaneous TID WC  . insulin aspart  0-5 Units Subcutaneous QHS  . isosorbide mononitrate  30 mg Oral Daily  . levothyroxine  225 mcg Oral QAC breakfast  . losartan  100 mg Oral Daily  . mouth rinse  15 mL Mouth Rinse BID  . polyethylene glycol  17 g Oral Daily  . senna-docusate  1 tablet Oral QHS  . sodium chloride flush  3 mL Intravenous Q12H  . sodium chloride flush  3 mL Intravenous Q12H  . sodium chloride flush  3 mL Intravenous Q12H    Continuous Infusions: . sodium chloride    . sodium chloride    . sodium chloride    . sodium chloride    . sodium chloride 10 mL/hr at 09/12/17  0645    PRN Meds: sodium chloride, sodium chloride, sodium chloride, acetaminophen, alum & mag hydroxide-simeth, hydrALAZINE, magnesium hydroxide, ondansetron (ZOFRAN) IV, sodium chloride flush, sodium chloride flush, sodium chloride flush   Labs   Results for orders placed or performed during the hospital encounter of 09/05/17 (from the past 48 hour(s))  Glucose, capillary     Status: Abnormal   Collection Time: 09/10/17 12:00 PM  Result Value Ref Range   Glucose-Capillary 195 (H) 65 - 99 mg/dL  I-STAT 3, venous blood gas (G3P V)     Status: Abnormal   Collection Time: 09/10/17  1:28 PM  Result Value Ref Range   pH, Ven 7.352 7.250 - 7.430   pCO2, Ven 57.6 44.0 - 60.0 mmHg   pO2, Ven 37.0 32.0 - 45.0 mmHg   Bicarbonate 32.0 (H) 20.0 - 28.0 mmol/L   TCO2 34 (H) 22 - 32 mmol/L   O2 Saturation 65.0 %   Acid-Base Excess 5.0 (H) 0.0 - 2.0 mmol/L   Patient  temperature HIDE    Sample type VENOUS    Comment NOTIFIED PHYSICIAN   I-STAT 3, arterial blood gas (G3+)     Status: Abnormal   Collection Time: 09/10/17  1:45 PM  Result Value Ref Range   pH, Arterial 7.384 7.350 - 7.450   pCO2 arterial 52.0 (H) 32.0 - 48.0 mmHg   pO2, Arterial 122.0 (H) 83.0 - 108.0 mmHg   Bicarbonate 31.0 (H) 20.0 - 28.0 mmol/L   TCO2 33 (H) 22 - 32 mmol/L   O2 Saturation 99.0 %   Acid-Base Excess 5.0 (H) 0.0 - 2.0 mmol/L   Patient temperature HIDE    Sample type ARTERIAL   Glucose, capillary     Status: Abnormal   Collection Time: 09/10/17  5:26 PM  Result Value Ref Range   Glucose-Capillary 142 (H) 65 - 99 mg/dL  Glucose, capillary     Status: Abnormal   Collection Time: 09/10/17  9:12 PM  Result Value Ref Range   Glucose-Capillary 164 (H) 65 - 99 mg/dL  Basic metabolic panel     Status: Abnormal   Collection Time: 09/11/17  5:49 AM  Result Value Ref Range   Sodium 136 135 - 145 mmol/L   Potassium 4.8 3.5 - 5.1 mmol/L   Chloride 101 101 - 111 mmol/L   CO2 28 22 - 32 mmol/L   Glucose, Bld 114 (H) 65 - 99 mg/dL   BUN 30 (H) 6 - 20 mg/dL   Creatinine, Ser 1.28 (H) 0.44 - 1.00 mg/dL   Calcium 8.5 (L) 8.9 - 10.3 mg/dL   GFR calc non Af Amer 45 (L) >60 mL/min   GFR calc Af Amer 52 (L) >60 mL/min    Comment: (NOTE) The eGFR has been calculated using the CKD EPI equation. This calculation has not been validated in all clinical situations. eGFR's persistently <60 mL/min signify possible Chronic Kidney Disease.    Anion gap 7 5 - 15  CBC     Status: Abnormal   Collection Time: 09/11/17  5:49 AM  Result Value Ref Range   WBC 7.3 4.0 - 10.5 K/uL   RBC 4.05 3.87 - 5.11 MIL/uL   Hemoglobin 10.5 (L) 12.0 - 15.0 g/dL   HCT 33.7 (L) 36.0 - 46.0 %   MCV 83.2 78.0 - 100.0 fL   MCH 25.9 (L) 26.0 - 34.0 pg   MCHC 31.2 30.0 - 36.0 g/dL   RDW 14.3 11.5 - 15.5 %   Platelets  214 150 - 400 K/uL  Glucose, capillary     Status: Abnormal   Collection Time: 09/11/17   7:50 AM  Result Value Ref Range   Glucose-Capillary 118 (H) 65 - 99 mg/dL   Comment 1 Notify RN    Comment 2 Document in Chart   Glucose, capillary     Status: Abnormal   Collection Time: 09/11/17 11:43 AM  Result Value Ref Range   Glucose-Capillary 165 (H) 65 - 99 mg/dL   Comment 1 Notify RN    Comment 2 Document in Chart   Glucose, capillary     Status: Abnormal   Collection Time: 09/11/17  3:55 PM  Result Value Ref Range   Glucose-Capillary 199 (H) 65 - 99 mg/dL   Comment 1 Notify RN   Glucose, capillary     Status: Abnormal   Collection Time: 09/11/17  9:41 PM  Result Value Ref Range   Glucose-Capillary 138 (H) 65 - 99 mg/dL   Comment 1 Notify RN    Comment 2 Document in Chart   Basic metabolic panel     Status: Abnormal   Collection Time: 09/12/17  4:01 AM  Result Value Ref Range   Sodium 135 135 - 145 mmol/L   Potassium 4.9 3.5 - 5.1 mmol/L   Chloride 99 (L) 101 - 111 mmol/L   CO2 28 22 - 32 mmol/L   Glucose, Bld 138 (H) 65 - 99 mg/dL   BUN 30 (H) 6 - 20 mg/dL   Creatinine, Ser 1.28 (H) 0.44 - 1.00 mg/dL   Calcium 8.5 (L) 8.9 - 10.3 mg/dL   GFR calc non Af Amer 45 (L) >60 mL/min   GFR calc Af Amer 52 (L) >60 mL/min    Comment: (NOTE) The eGFR has been calculated using the CKD EPI equation. This calculation has not been validated in all clinical situations. eGFR's persistently <60 mL/min signify possible Chronic Kidney Disease.    Anion gap 8 5 - 15  Protime-INR     Status: None   Collection Time: 09/12/17  4:01 AM  Result Value Ref Range   Prothrombin Time 13.9 11.4 - 15.2 seconds   INR 1.08   CBC     Status: Abnormal   Collection Time: 09/12/17  4:01 AM  Result Value Ref Range   WBC 8.1 4.0 - 10.5 K/uL   RBC 3.85 (L) 3.87 - 5.11 MIL/uL   Hemoglobin 10.1 (L) 12.0 - 15.0 g/dL   HCT 32.1 (L) 36.0 - 46.0 %   MCV 83.4 78.0 - 100.0 fL   MCH 26.2 26.0 - 34.0 pg   MCHC 31.5 30.0 - 36.0 g/dL   RDW 14.6 11.5 - 15.5 %   Platelets 203 150 - 400 K/uL  Glucose,  capillary     Status: Abnormal   Collection Time: 09/12/17  7:53 AM  Result Value Ref Range   Glucose-Capillary 160 (H) 65 - 99 mg/dL    ECG   N/A  Telemetry   NSR with LBBB - Personally Reviewed  Radiology    No results found.  Cardiac Studies   N/A  Assessment   1. Principal Problem: 2.   Acute combined systolic (congestive) and diastolic (congestive) heart failure ([place]) 3. Active Problems: 4.   Hypothyroidism 5.   Essential hypertension 6.   Sleep apnea 7.   Morbid obesity ([place]) 8.   Diabetes mellitus type 2 with complications, uncontrolled ([place]) 9.   Pericardial effusion 10.   AKI (acute kidney injury) ([place])  11.   Elevated troponin I level 12.   CAD, multiple vessel 13.   Plan   1. Plan for multivessel PCI Today with Dr. Ellyn Hack. No questions about the procedure. Creatinine has been stable.  Time Spent Directly with Patient:  I have spent a total of 15 minutes with the patient reviewing hospital notes, telemetry, EKGs, labs and examining the patient as well as establishing an assessment and plan that was discussed personally with the patient. > 50% of time was spent in direct patient care.  Length of Stay:  LOS: 6 days   Pixie Casino, MD, [place]  Attending Cardiologist  Direct Dial: [phone]  Fax: [phone]  Website:  [email or web address] 09/12/2017, 8:39 AM

## 2017-09-13 ENCOUNTER — Other Ambulatory Visit: Payer: Self-pay | Admitting: Physician Assistant

## 2017-09-13 DIAGNOSIS — I313 Pericardial effusion (noninflammatory): Secondary | ICD-10-CM

## 2017-09-13 DIAGNOSIS — I3139 Other pericardial effusion (noninflammatory): Secondary | ICD-10-CM

## 2017-09-13 DIAGNOSIS — Z955 Presence of coronary angioplasty implant and graft: Secondary | ICD-10-CM

## 2017-09-13 LAB — BASIC METABOLIC PANEL
Anion gap: 8 (ref 5–15)
BUN: 30 mg/dL — AB (ref 6–20)
CHLORIDE: 100 mmol/L — AB (ref 101–111)
CO2: 27 mmol/L (ref 22–32)
Calcium: 8.1 mg/dL — ABNORMAL LOW (ref 8.9–10.3)
Creatinine, Ser: 1.39 mg/dL — ABNORMAL HIGH (ref 0.44–1.00)
GFR calc Af Amer: 47 mL/min — ABNORMAL LOW (ref >60)
GFR calc non Af Amer: 41 mL/min — ABNORMAL LOW (ref >60)
GLUCOSE: 208 mg/dL — AB (ref 65–99)
POTASSIUM: 4.7 mmol/L (ref 3.5–5.1)
SODIUM: 135 mmol/L (ref 135–145)

## 2017-09-13 LAB — GLUCOSE, CAPILLARY
GLUCOSE-CAPILLARY: 173 mg/dL — AB (ref 65–99)
Glucose-Capillary: 173 mg/dL — ABNORMAL HIGH (ref 65–99)
Glucose-Capillary: 121 mg/dL — ABNORMAL HIGH (ref 65–99)
Glucose-Capillary: 160 mg/dL — ABNORMAL HIGH (ref 65–99)

## 2017-09-13 LAB — CBC
HEMATOCRIT: 31.4 % — AB (ref 36.0–46.0)
HEMOGLOBIN: 9.6 g/dL — AB (ref 12.0–15.0)
MCH: 25.5 pg — ABNORMAL LOW (ref 26.0–34.0)
MCHC: 30.6 g/dL (ref 30.0–36.0)
MCV: 83.3 fL (ref 78.0–100.0)
Platelets: 184 10*3/uL (ref 150–400)
RBC: 3.77 MIL/uL — ABNORMAL LOW (ref 3.87–5.11)
RDW: 14.2 % (ref 11.5–15.5)
WBC: 7.5 10*3/uL (ref 4.0–10.5)

## 2017-09-13 MED ORDER — METFORMIN HCL 1000 MG PO TABS: 1000.0000 mg | ORAL_TABLET | Freq: Two times a day (BID) | ORAL | 0 refills | Status: DC

## 2017-09-13 MED ORDER — ANGIOPLASTY BOOK
Freq: Once | Status: DC
  Filled 2017-09-13: qty 1

## 2017-09-13 MED ORDER — LOSARTAN POTASSIUM 100 MG PO TABS
100.0000 mg | ORAL_TABLET | Freq: Every day | ORAL | 0 refills | Status: DC
Start: 2017-09-14 — End: 2017-11-07

## 2017-09-13 MED ORDER — AMLODIPINE BESYLATE 5 MG PO TABS: 5.0000 mg | ORAL_TABLET | Freq: Every day | ORAL | 0 refills | Status: DC

## 2017-09-13 MED ORDER — ASPIRIN 81 MG PO CHEW: 81.0000 mg | CHEWABLE_TABLET | Freq: Every day | ORAL | 0 refills | Status: DC

## 2017-09-13 MED ORDER — CLOPIDOGREL BISULFATE 75 MG PO TABS: 75.0000 mg | ORAL_TABLET | Freq: Every day | ORAL | 0 refills | Status: DC

## 2017-09-13 MED ORDER — ATORVASTATIN CALCIUM 80 MG PO TABS: 80.0000 mg | ORAL_TABLET | Freq: Every day | ORAL | 0 refills | Status: DC

## 2017-09-13 MED ORDER — ISOSORBIDE MONONITRATE ER 30 MG PO TB24: 30.0000 mg | ORAL_TABLET | Freq: Every day | ORAL | 0 refills | Status: DC

## 2017-09-13 MED ORDER — CARVEDILOL 6.25 MG PO TABS: 6.2500 mg | ORAL_TABLET | Freq: Two times a day (BID) | ORAL | 0 refills | Status: DC

## 2017-09-13 NOTE — Progress Notes (Signed)
Pt declined ambulation, I could not talk her into it. She sts that she is "just so tired" from lack of sleep. Advised her that PT would be coming later and highly recommended that she walks with them. She agreed.   Discussed stent, restrictions, Plavix, HF, low sodium diet, daily wts, DM, walking daily and CRPII. Will refer to [place]. Pt voiced understanding. She has been drinking regular sodas but sts she wants to make change. Gave her HF video to watch. I will bring back HF book as this unit does not have any. [place] CES, ACSM 9:49 AM 09/13/2017

## 2017-09-13 NOTE — Discharge Summary (Addendum)
PATIENT DETAILS Name: Maureen Jackson Age: 61 y.o. Sex: female Date of Birth: 06-12-1956 MRN: [id number]. Admitting Physician: Karmen Bongo, MD SNK:[id number], No Pcp Per  Admit Date: 09/05/2017 Discharge date: 09/13/2017  Recommendations for Outpatient Follow-up:  1. Follow up with PCP in 1-2 weeks 2. Please obtain BMP/CBC in one week 3. Please ensure follow up with cardiology in 1 week. 4. Repeat Echo in 1 week-re assess pericardial effusion  Admitted From:  Home  Disposition: [place]: No  Equipment/Devices: None  Discharge Condition: Stable  CODE STATUS: FULL CODE  Diet recommendation:  Heart Healthy / Carb Modified   Brief Summary: See H&P, Labs, Consult and Test reports for all details in brief,Patient is a 61 y.o. female with history of hypertension, DM-2, hypothyroidism, admitted with new onset systolic heart failure, further evaluation revealed multi vessel CAD, evaluated by cardiothoracic surgery, and deemed not to be a candidate for CABG, underwent PCI on 10/3. Please see below for further details  Brief Hospital Course: Acute systolic and diastolic heart failure: Volume status stable, diuretics held with increasing creatinine and anticipation of PCI-continues to be stable-will resume usual dosing of Lasix on discharge. Continue Coreg and losartan.  Multivessel CAD: Assessed by cardiothoracic surgery-not deemed to be a candidate for CABG, subsequently underwent PCI to LAD and RCA lesion ON 10/3, per cardiology-circumflex has diffuse disease-and plans are to treat this medically. Continue aspirin, Plavix, Coreg, losartan and statin.Spoke with cards-Dr Hilty-ok to d/c today, cardiology will arrange for outpatient follow up in 1 week. Ambulated with PT in the hallway-no recommendations for outpatient PT.   Mild acute kidney injury: Creatinine appears stable and close to usual baseline. Please follow lytes in the outpatient setting-suggest repeat in 1  week.  Pericardial effusion: Seen on echo on 10/1-no signs of tamponade, will require a repeat echocardiogram short-term follow-up.Spoke with Dr Gerri Lins will arrange at next follow up. BP currently stable.   Type II DM: CBGs stable, continue SSI-resume glimepiride and metformin on discharge.  Dyslipidemia: Previously on Zocor-switched to high intensity Lipitor post PCI  Hypothyroidism: Continue with synthroid  Morbid obesity: Counseled regarding importance of weight loss  OSA: Noncompliant to CPAP in the outpatient setting-suggest re-assess in the outpatient setting.   Procedures/Studies: 10/3>>PCI to LAD and RCA  09/10/17>>Right and left heart cath   Prox RCA lesion, 30 %stenosed.  Prox RCA to Mid RCA lesion, 70 %stenosed.  Mid RCA lesion, 90 %stenosed.  RPDA lesion, 80 %stenosed.  Prox Cx lesion, 20 %stenosed.  Ost 1st Mrg lesion, 80 %stenosed.  1st Mrg-2 lesion, 80 %stenosed.  1st Mrg-1 lesion, 90 %stenosed.  Mid Cx-2 lesion, 50 %stenosed.  Mid Cx-1 lesion, 60 %stenosed.  Prox LAD lesion, 60 %stenosed.  10/1>>TEE:   -This was a limited study for pericardial effusion evaluation. When compared to the prior study from 09/06/2017 there is unchanged amount of moderate circumferential pericardial effusion - 2 cm around the right ventricle and posteriorly. There is right atrial invagination and but complete RV filling in diastole. IVC is collapsing. No signs of tamponade. A follow up study in 1 week is recommended   Discharge Diagnoses:  Principal Problem:   Acute combined systolic (congestive) and diastolic (congestive) heart failure (HCC) Active Problems:   Hypothyroidism   Essential hypertension   Sleep apnea   Morbid obesity ([place])   Diabetes mellitus type 2 with complications, uncontrolled (HCC)   Pericardial effusion   AKI (acute kidney injury) ([place])   Elevated troponin I level  CAD, multiple vessel   Discharge  Instructions:  Activity:  As tolerated with Full fall precautions use walker/cane & assistance as needed  Discharge Instructions    (HEART FAILURE PATIENTS) Call MD:  Anytime you have any of the following symptoms: 1) 3 pound weight gain in 24 hours or 5 pounds in 1 week 2) shortness of breath, with or without a dry hacking cough 3) swelling in the hands, feet or stomach 4) if you have to sleep on extra pillows at night in order to breathe.    Complete by:  As directed    Amb Referral to Cardiac Rehabilitation    Complete by:  As directed    Diagnosis:   Heart Failure (see criteria below if ordering Phase II) Coronary Stents PTCA     Heart Failure Type:  Chronic Systolic & Diastolic   Diet - low sodium heart healthy    Complete by:  As directed    Diet Carb Modified    Complete by:  As directed    Increase activity slowly    Complete by:  As directed      Allergies as of 09/13/2017      Reactions   Aspirin Nausea Only      Medication List    STOP taking these medications   labetalol 300 MG tablet Commonly known as:  NORMODYNE   losartan-hydrochlorothiazide 100-25 MG tablet Commonly known as:  HYZAAR   simvastatin 10 MG tablet Commonly known as:  ZOCOR     TAKE these medications   amLODipine 5 MG tablet Commonly known as:  NORVASC Take 1 tablet (5 mg total) by mouth daily.   aspirin 81 MG chewable tablet Chew 1 tablet (81 mg total) by mouth daily.   atorvastatin 80 MG tablet Commonly known as:  LIPITOR Take 1 tablet (80 mg total) by mouth daily at 6 PM.   carvedilol 6.25 MG tablet Commonly known as:  COREG Take 1 tablet (6.25 mg total) by mouth 2 (two) times daily with a meal.   clopidogrel 75 MG tablet Commonly known as:  PLAVIX Take 1 tablet (75 mg total) by mouth daily with breakfast.   furosemide 40 MG tablet Commonly known as:  LASIX Take 40 mg by mouth daily.   glimepiride 2 MG tablet Commonly known as:  AMARYL Take 2 mg by mouth daily.     isosorbide mononitrate 30 MG 24 hr tablet Commonly known as:  IMDUR Take 1 tablet (30 mg total) by mouth daily.   levothyroxine 200 MCG tablet Commonly known as:  SYNTHROID, LEVOTHROID Take 200 mcg by mouth daily.   losartan 100 MG tablet Commonly known as:  COZAAR Take 1 tablet (100 mg total) by mouth daily.   metFORMIN 1000 MG tablet Commonly known as:  GLUCOPHAGE Take 1 tablet (1,000 mg total) by mouth 2 (two) times daily with a meal.   VENTOLIN HFA 108 (90 Base) MCG/ACT inhaler Generic drug:  albuterol Inhale 1-2 puffs into the lungs every 6 (six) hours as needed for wheezing or shortness of breath.            Durable Medical Equipment        Start     Ordered   09/13/17 1408  For home use only DME Cane  Once     09/13/17 1407   09/13/17 1331  For home use only DME Walker rolling  Once    Question:  Patient needs a walker to treat with the following condition  Answer:  Physical deconditioning   09/13/17 1331     Follow-up Information    Neale Burly, MD. Schedule an appointment as soon as possible for a visit in 1 week(s).   Specialty:  Internal Medicine Contact information: [place] [phone]        [name], [place] Follow up on 09/19/2017.   Specialty:  Cardiology Why:  11:30 am for hospital follow up Contact information: [place] [id number] [phone]        [facility] Follow up on 09/17/2017.   Why:  11:15 for limited echocardiogram Contact information: [address] [phone]         Allergies  Allergen Reactions  . Aspirin Nausea Only    Consultations:   cardiology  CTVS   Other Procedures/Studies: Dg Chest Port 1 View  Result Date: 09/05/2017 CLINICAL DATA:  One-week history of cough. Acute onset of shortness of breath and chills that began this evening. Current history of diabetes and hypertension. EXAM: PORTABLE  CHEST 1 VIEW COMPARISON:  03/08/2010, 05/07/2009 and earlier. FINDINGS: Massive cardiac enlargement, significantly increased in size since 2011. Pulmonary venous hypertension and mild diffuse interstitial pulmonary edema. No visible pleural effusions. IMPRESSION: Mild CHF, with massive cardiomegaly and mild diffuse interstitial pulmonary edema. The heart has significantly increased in size since 2011. Electronically Signed   By: Evangeline Dakin M.D.   On: 09/05/2017 20:31     TODAY-DAY OF DISCHARGE:  Subjective:   Adabella Petre today has no headache,no chest abdominal pain,no new weakness tingling or numbness, feels much better wants to go home today.   Objective:   Blood pressure 137/65, pulse 72, temperature 98.6 F (37 C), temperature source Oral, resp. rate 14, height 5\' 5"  (1.651 m), weight (!) 141.8 kg (312 lb 11.2 oz), SpO2 93 %.  Intake/Output Summary (Last 24 hours) at 09/13/17 1640 Last data filed at 09/13/17 1100  Gross per 24 hour  Intake              243 ml  Output             1350 ml  Net            -1107 ml   Filed Weights   09/10/17 0613 09/11/17 0325 09/12/17 0555  Weight: (!) 142.5 kg (314 lb 1.6 oz) (!) 141.7 kg (312 lb 4.8 oz) (!) 141.8 kg (312 lb 11.2 oz)    Exam: Awake Alert, Oriented *3, No new F.N deficits, Normal affect [place].AT,PERRAL Supple Neck,No JVD, No cervical lymphadenopathy appriciated.  Symmetrical Chest wall movement, Good air movement bilaterally, CTAB RRR,No Gallops,Rubs or new Murmurs, No Parasternal Heave +ve B.Sounds, Abd Soft, Non tender, No organomegaly appriciated, No rebound -guarding or rigidity. No Cyanosis, Clubbing or edema, No new Rash or bruise   PERTINENT RADIOLOGIC STUDIES: Dg Chest Port 1 View  Result Date: 09/05/2017 CLINICAL DATA:  One-week history of cough. Acute onset of shortness of breath and chills that began this evening. Current history of diabetes and hypertension. EXAM: PORTABLE CHEST 1 VIEW COMPARISON:   03/08/2010, 05/07/2009 and earlier. FINDINGS: Massive cardiac enlargement, significantly increased in size since 2011. Pulmonary venous hypertension and mild diffuse interstitial pulmonary edema. No visible pleural effusions. IMPRESSION: Mild CHF, with massive cardiomegaly and mild diffuse interstitial pulmonary edema. The heart has significantly increased in size since 2011. Electronically Signed   By: Evangeline Dakin M.D.   On:  09/05/2017 20:31     PERTINENT LAB RESULTS: CBC:  Recent Labs  09/12/17 0401 09/13/17 0321  WBC 8.1 7.5  HGB 10.1* 9.6*  HCT 32.1* 31.4*  PLT 203 184   CMET CMP     Component Value Date/Time   NA 135 09/13/2017 0321   K 4.7 09/13/2017 0321   CL 100 (L) 09/13/2017 0321   CO2 27 09/13/2017 0321   GLUCOSE 208 (H) 09/13/2017 0321   BUN 30 (H) 09/13/2017 0321   CREATININE 1.39 (H) 09/13/2017 0321   CALCIUM 8.1 (L) 09/13/2017 0321   PROT 8.3 (H) 09/05/2017 2012   ALBUMIN 3.7 09/05/2017 2012   AST 36 09/05/2017 2012   ALT 34 09/05/2017 2012   ALKPHOS 151 (H) 09/05/2017 2012   BILITOT 0.9 09/05/2017 2012   GFRNONAA 41 (L) 09/13/2017 0321   GFRAA 47 (L) 09/13/2017 0321    GFR Estimated Creatinine Clearance: 62.5 mL/min (A) (by C-G formula based on SCr of 1.39 mg/dL (H)). No results for input(s): LIPASE, AMYLASE in the last 72 hours. No results for input(s): CKTOTAL, CKMB, CKMBINDEX, TROPONINI in the last 72 hours. Invalid input(s): POCBNP No results for input(s): DDIMER in the last 72 hours. No results for input(s): HGBA1C in the last 72 hours. No results for input(s): CHOL, HDL, LDLCALC, TRIG, CHOLHDL, LDLDIRECT in the last 72 hours. No results for input(s): TSH, T4TOTAL, T3FREE, THYROIDAB in the last 72 hours.  Invalid input(s): FREET3 No results for input(s): VITAMINB12, FOLATE, FERRITIN, TIBC, IRON, RETICCTPCT in the last 72 hours. Coags:  Recent Labs  09/12/17 0401  INR 1.08   Microbiology: No results found for this or any previous  visit (from the past 240 hour(s)).  FURTHER DISCHARGE INSTRUCTIONS:  Get Medicines reviewed and adjusted: Please take all your medications with you for your next visit with your Primary MD  Laboratory/radiological data: Please request your Primary MD to go over all hospital tests and procedure/radiological results at the follow up, please ask your Primary MD to get all Hospital records sent to his/her office.  In some cases, they will be blood work, cultures and biopsy results pending at the time of your discharge. Please request that your primary care M.D. goes through all the records of your hospital data and follows up on these results.  Also Note the following: If you experience worsening of your admission symptoms, develop shortness of breath, life threatening emergency, suicidal or homicidal thoughts you must seek medical attention immediately by calling 911 or calling your MD immediately  if symptoms less severe.  You must read complete instructions/literature along with all the possible adverse reactions/side effects for all the Medicines you take and that have been prescribed to you. Take any new Medicines after you have completely understood and accpet all the possible adverse reactions/side effects.   Do not drive when taking Pain medications or sleeping medications (Benzodaizepines)  Do not take more than prescribed Pain, Sleep and Anxiety Medications. It is not advisable to combine anxiety,sleep and pain medications without talking with your primary care practitioner  Special Instructions: If you have smoked or chewed Tobacco  in the last 2 yrs please stop smoking, stop any regular Alcohol  and or any Recreational drug use.  Wear Seat belts while driving.  Please note: You were cared for by a hospitalist during your hospital stay. Once you are discharged, your primary care physician will handle any further medical issues. Please note that NO REFILLS for any discharge  medications will be authorized  once you are discharged, as it is imperative that you return to your primary care physician (or establish a relationship with a primary care physician if you do not have one) for your post hospital discharge needs so that they can reassess your need for medications and monitor your lab values.  Total Time spent coordinating discharge including counseling, education and face to face time equals 45 minutes.  Signed: Ahnika Hannibal 09/13/2017 4:40 PM

## 2017-09-13 NOTE — Progress Notes (Signed)
Patient seen  by Dr Sloan Leiter with discharge order in place. .Patient 's sister at bedside, upset, & strongly disagree  with discharge plan..   Stated ,as  per conversation  with MD yesterday , patient is staying today and made  All  her  arrangement for  transportation and home assistance for tomorrow. AC  Ron notified , seen patient and family.Patient to stay and dischage inAM per latter [facility].

## 2017-09-13 NOTE — Progress Notes (Signed)
Physical Therapy Treatment Patient Details Name: Maureen Jackson MRN: [id number] DOB: 1956-11-22 Today's Date: 09/13/2017    History of Present Illness Pt is a 61 y.o. female with history of OSA not on cpap, obesity, HTN, DM2, and hypothyroidism. She was admitted with SOB and acute systolic heart failure with CAD s/p cath 10/3 with PCI     PT Comments    Pt pleasant and willing to ambulate with encouragement. Pt lives alone and would like to return to shopping on her own and educated for need to increase ambulation and exercise acutely to make that happen. Pt verbalized understanding. Will continue to follow and encouraged further ambulation with nursing.   HR 76-85 SPO2 93% on Ra  Follow Up Recommendations  No PT follow up;Supervision - Intermittent     Equipment Recommendations  Rolling walker with 5" wheels    Recommendations for Other Services       Precautions / Restrictions Precautions Precautions: None    Mobility  Bed Mobility               General bed mobility comments: pt OOB in chair upon arrival  Transfers Overall transfer level: Modified independent                  Ambulation/Gait Ambulation/Gait assistance: Supervision Ambulation Distance (Feet): 200 Feet Assistive device: Rolling walker (2 wheeled) Gait Pattern/deviations: Step-through pattern;Decreased stride length   Gait velocity interpretation: Below normal speed for age/gender General Gait Details: cues for position in RW and encouragement to maximize distance. x 2 trials with seated rest between   Stairs            Wheelchair Mobility    Modified Rankin (Stroke Patients Only)       Balance Overall balance assessment: No apparent balance deficits (not formally assessed)                                          Cognition Arousal/Alertness: Awake/alert Behavior During Therapy: Flat affect Overall Cognitive Status: Within Functional Limits for tasks  assessed                                        Exercises General Exercises - Lower Extremity Long Arc Quad: AROM;Both;Seated;15 reps Hip Flexion/Marching: AROM;Both;Seated;15 reps Toe Raises: AROM;Both;Seated;15 reps Heel Raises: AROM;15 reps;Both;Seated    General Comments        Pertinent Vitals/Pain Pain Assessment: No/denies pain    Home Living                      Prior Function            PT Goals (current goals can now be found in the care plan section) Progress towards PT goals: Progressing toward goals    Frequency    Min 3X/week      PT Plan Current plan remains appropriate    Co-evaluation              AM-PAC PT "6 Clicks" Daily Activity  Outcome Measure  Difficulty turning over in bed (including adjusting bedclothes, sheets and blankets)?: A Little Difficulty moving from lying on back to sitting on the side of the bed? : A Little Difficulty sitting down on and standing up from a chair with arms (e.g.,  wheelchair, bedside commode, etc,.)?: None Help needed moving to and from a bed to chair (including a wheelchair)?: None Help needed walking in hospital room?: A Little Help needed climbing 3-5 steps with a railing? : A Little 6 Click Score: 20    End of Session Equipment Utilized During Treatment: Gait belt Activity Tolerance: Patient tolerated treatment well Patient left: in chair;with call bell/phone within reach Nurse Communication: Mobility status PT Visit Diagnosis: Difficulty in walking, not elsewhere classified (R26.2)     Time: 5830-9407 PT Time Calculation (min) (ACUTE ONLY): 26 min  Charges:  $Gait Training: 8-22 mins $Therapeutic Exercise: 8-22 mins                    G Codes:       Elwyn Reach, [place]   [place] 09/13/2017, 12:44 PM

## 2017-09-13 NOTE — Progress Notes (Signed)
DAILY PROGRESS NOTE   Patient Name: Maureen Jackson Date of Encounter: 09/13/2017  Hospital Problem List   Principal Problem:   Acute combined systolic (congestive) and diastolic (congestive) heart failure (HCC) Active Problems:   Hypothyroidism   Essential hypertension   Sleep apnea   Morbid obesity ([place])   Diabetes mellitus type 2 with complications, uncontrolled (HCC)   Pericardial effusion   AKI (acute kidney injury) ([place])   Elevated troponin I level   CAD, multiple vessel    Chief Complaint   Tired  Subjective   No events overnight. Successful PCI to the RCA and LAD with 38 mm DES stents. Excellent result. No complaints of chest pain today. Says she is very fatigued. Not sleeping well. Declined participation in cardiac rehab today. Still has a presumed moderate pericardial effusion. Will need follow-up early next week with a repeat echo.  Objective   Vitals:   09/12/17 2144 09/13/17 0100 09/13/17 0410 09/13/17 0805  BP: 98/62 (!) 105/58 116/60 (!) 147/95  Pulse: 77 73 74 72  Resp: (!) 22 18 15 14   Temp:  98.7 F (37.1 C) 98.5 F (36.9 C) 99.1 F (37.3 C)  TempSrc:  Oral Oral Oral  SpO2: 94% 95% 95% 96%  Weight:      Height:        Intake/Output Summary (Last 24 hours) at 09/13/17 1148 Last data filed at 09/13/17 0355  Gross per 24 hour  Intake              243 ml  Output             1050 ml  Net             -807 ml   Filed Weights   09/10/17 0613 09/11/17 0325 09/12/17 0555  Weight: (!) 314 lb 1.6 oz (142.5 kg) (!) 312 lb 4.8 oz (141.7 kg) (!) 312 lb 11.2 oz (141.8 kg)    Physical Exam   General appearance: alert, no distress and morbidly obese Neck: no carotid bruit, no JVD and thyroid not enlarged, symmetric, no tenderness/mass/nodules Lungs: diminished breath sounds bilaterally Heart: regular rate and rhythm Abdomen: soft, non-tender; bowel sounds normal; no masses,  no organomegaly Extremities: extremities normal, atraumatic, no cyanosis or  edema Pulses: 2+ and symmetric Skin: Skin color, texture, turgor normal. No rashes or lesions or no ecchymosis or bruit at the right radial cath site Neurologic: Grossly normal Psych: Pleasant  Inpatient Medications    Scheduled Meds: . amLODipine  5 mg Oral Daily  . angioplasty book   Does not apply Once  . aspirin  81 mg Oral Daily  . atorvastatin  80 mg Oral q1800  . carvedilol  6.25 mg Oral BID WC  . clopidogrel  75 mg Oral Q breakfast  . enoxaparin (LOVENOX) injection  40 mg Subcutaneous Q24H  . insulin aspart  0-20 Units Subcutaneous TID WC  . insulin aspart  0-5 Units Subcutaneous QHS  . isosorbide mononitrate  30 mg Oral Daily  . levothyroxine  225 mcg Oral QAC breakfast  . losartan  100 mg Oral Daily  . mouth rinse  15 mL Mouth Rinse BID  . polyethylene glycol  17 g Oral Daily  . senna-docusate  1 tablet Oral QHS  . sodium chloride flush  3 mL Intravenous Q12H  . sodium chloride flush  3 mL Intravenous Q12H  . sodium chloride flush  3 mL Intravenous Q12H    Continuous Infusions: . sodium chloride    .  sodium chloride    . sodium chloride    . sodium chloride      PRN Meds: sodium chloride, sodium chloride, sodium chloride, acetaminophen, alum & mag hydroxide-simeth, hydrALAZINE, magnesium hydroxide, ondansetron (ZOFRAN) IV, sodium chloride flush, sodium chloride flush, sodium chloride flush   Labs   Results for orders placed or performed during the hospital encounter of 09/05/17 (from the past 48 hour(s))  Glucose, capillary     Status: Abnormal   Collection Time: 09/11/17  3:55 PM  Result Value Ref Range   Glucose-Capillary 199 (H) 65 - 99 mg/dL   Comment 1 Notify RN   Glucose, capillary     Status: Abnormal   Collection Time: 09/11/17  9:41 PM  Result Value Ref Range   Glucose-Capillary 138 (H) 65 - 99 mg/dL   Comment 1 Notify RN    Comment 2 Document in Chart   Basic metabolic panel     Status: Abnormal   Collection Time: 09/12/17  4:01 AM  Result  Value Ref Range   Sodium 135 135 - 145 mmol/L   Potassium 4.9 3.5 - 5.1 mmol/L   Chloride 99 (L) 101 - 111 mmol/L   CO2 28 22 - 32 mmol/L   Glucose, Bld 138 (H) 65 - 99 mg/dL   BUN 30 (H) 6 - 20 mg/dL   Creatinine, Ser 1.28 (H) 0.44 - 1.00 mg/dL   Calcium 8.5 (L) 8.9 - 10.3 mg/dL   GFR calc non Af Amer 45 (L) >60 mL/min   GFR calc Af Amer 52 (L) >60 mL/min    Comment: (NOTE) The eGFR has been calculated using the CKD EPI equation. This calculation has not been validated in all clinical situations. eGFR's persistently <60 mL/min signify possible Chronic Kidney Disease.    Anion gap 8 5 - 15  Protime-INR     Status: None   Collection Time: 09/12/17  4:01 AM  Result Value Ref Range   Prothrombin Time 13.9 11.4 - 15.2 seconds   INR 1.08   CBC     Status: Abnormal   Collection Time: 09/12/17  4:01 AM  Result Value Ref Range   WBC 8.1 4.0 - 10.5 K/uL   RBC 3.85 (L) 3.87 - 5.11 MIL/uL   Hemoglobin 10.1 (L) 12.0 - 15.0 g/dL   HCT 32.1 (L) 36.0 - 46.0 %   MCV 83.4 78.0 - 100.0 fL   MCH 26.2 26.0 - 34.0 pg   MCHC 31.5 30.0 - 36.0 g/dL   RDW 14.6 11.5 - 15.5 %   Platelets 203 150 - 400 K/uL  Glucose, capillary     Status: Abnormal   Collection Time: 09/12/17  7:53 AM  Result Value Ref Range   Glucose-Capillary 160 (H) 65 - 99 mg/dL  Urinalysis, Routine w reflex microscopic     Status: Abnormal   Collection Time: 09/12/17  9:59 AM  Result Value Ref Range   Color, Urine STRAW (A) YELLOW   APPearance CLEAR CLEAR   Specific Gravity, Urine 1.006 1.005 - 1.030   pH 6.0 5.0 - 8.0   Glucose, UA NEGATIVE NEGATIVE mg/dL   Hgb urine dipstick NEGATIVE NEGATIVE   Bilirubin Urine NEGATIVE NEGATIVE   Ketones, ur NEGATIVE NEGATIVE mg/dL   Protein, ur NEGATIVE NEGATIVE mg/dL   Nitrite NEGATIVE NEGATIVE   Leukocytes, UA NEGATIVE NEGATIVE  POCT Activated clotting time     Status: None   Collection Time: 09/12/17 11:01 AM  Result Value Ref Range   Activated Clotting Time  224 seconds  POCT  Activated clotting time     Status: None   Collection Time: 09/12/17 11:13 AM  Result Value Ref Range   Activated Clotting Time 246 seconds  POCT Activated clotting time     Status: None   Collection Time: 09/12/17 11:32 AM  Result Value Ref Range   Activated Clotting Time 307 seconds  POCT Activated clotting time     Status: None   Collection Time: 09/12/17 12:06 PM  Result Value Ref Range   Activated Clotting Time 224 seconds  POCT Activated clotting time     Status: None   Collection Time: 09/12/17 12:12 PM  Result Value Ref Range   Activated Clotting Time 307 seconds  POCT Activated clotting time     Status: None   Collection Time: 09/12/17 12:32 PM  Result Value Ref Range   Activated Clotting Time 208 seconds  Glucose, capillary     Status: Abnormal   Collection Time: 09/12/17  4:53 PM  Result Value Ref Range   Glucose-Capillary 137 (H) 65 - 99 mg/dL   Comment 1 Notify RN    Comment 2 Document in Chart   Glucose, capillary     Status: Abnormal   Collection Time: 09/12/17  9:26 PM  Result Value Ref Range   Glucose-Capillary 165 (H) 65 - 99 mg/dL   Comment 1 Notify RN    Comment 2 Document in Chart   Basic metabolic panel     Status: Abnormal   Collection Time: 09/13/17  3:21 AM  Result Value Ref Range   Sodium 135 135 - 145 mmol/L   Potassium 4.7 3.5 - 5.1 mmol/L   Chloride 100 (L) 101 - 111 mmol/L   CO2 27 22 - 32 mmol/L   Glucose, Bld 208 (H) 65 - 99 mg/dL   BUN 30 (H) 6 - 20 mg/dL   Creatinine, Ser 1.39 (H) 0.44 - 1.00 mg/dL   Calcium 8.1 (L) 8.9 - 10.3 mg/dL   GFR calc non Af Amer 41 (L) >60 mL/min   GFR calc Af Amer 47 (L) >60 mL/min    Comment: (NOTE) The eGFR has been calculated using the CKD EPI equation. This calculation has not been validated in all clinical situations. eGFR's persistently <60 mL/min signify possible Chronic Kidney Disease.    Anion gap 8 5 - 15  CBC     Status: Abnormal   Collection Time: 09/13/17  3:21 AM  Result Value Ref Range     WBC 7.5 4.0 - 10.5 K/uL   RBC 3.77 (L) 3.87 - 5.11 MIL/uL   Hemoglobin 9.6 (L) 12.0 - 15.0 g/dL   HCT 31.4 (L) 36.0 - 46.0 %   MCV 83.3 78.0 - 100.0 fL   MCH 25.5 (L) 26.0 - 34.0 pg   MCHC 30.6 30.0 - 36.0 g/dL   RDW 14.2 11.5 - 15.5 %   Platelets 184 150 - 400 K/uL  Glucose, capillary     Status: Abnormal   Collection Time: 09/13/17  6:26 AM  Result Value Ref Range   Glucose-Capillary 173 (H) 65 - 99 mg/dL  Glucose, capillary     Status: Abnormal   Collection Time: 09/13/17 11:37 AM  Result Value Ref Range   Glucose-Capillary 173 (H) 65 - 99 mg/dL   Comment 1 Notify RN    Comment 2 Document in Chart     ECG   N/A  Telemetry   NSR with LBBB - Personally Reviewed  Radiology  No results found.  Cardiac Studies   N/A  Assessment   Principal Problem:   Acute combined systolic (congestive) and diastolic (congestive) heart failure (HCC) Active Problems:   Hypothyroidism   Essential hypertension   Sleep apnea   Morbid obesity ([place])   Diabetes mellitus type 2 with complications, uncontrolled (HCC)   Pericardial effusion   AKI (acute kidney injury) ([place])   Elevated troponin I level   CAD, multiple vessel   Plan   1. Successful 2 vessel PCI yesterday - she still c/o fatigue and weakness. Did not walk with rehab today. May need CIR evaluation. She remains negative 1.4L yesterday without diuretics. Will probably need maintenance diuretic at discharge.   Time Spent Directly with Patient:  I have spent a total of 15 minutes with the patient reviewing hospital notes, telemetry, EKGs, labs and examining the patient as well as establishing an assessment and plan that was discussed personally with the patient. > 50% of time was spent in direct patient care.  Length of Stay:  LOS: 7 days   Pixie Casino, MD, [place]  Attending Cardiologist  Direct Dial: [phone]  Fax: [phone]  Website:  [email or web address]  Queenie Aufiero 09/13/2017, 11:48 AM

## 2017-09-14 LAB — GLUCOSE, CAPILLARY: GLUCOSE-CAPILLARY: 222 mg/dL — AB (ref 65–99)

## 2017-09-14 MED ORDER — VENTOLIN HFA 108 (90 BASE) MCG/ACT IN AERS: 1.0000 | INHALATION_SPRAY | Freq: Four times a day (QID) | RESPIRATORY_TRACT | 0 refills | Status: DC | PRN

## 2017-09-14 MED ORDER — FUROSEMIDE 40 MG PO TABS: 40.0000 mg | ORAL_TABLET | Freq: Every day | ORAL | 0 refills | Status: DC

## 2017-09-14 NOTE — Progress Notes (Addendum)
Seen and examined Vital signs remain stable She denies any chest pain or shortness of breath  Lungs: Clear to auscultation CVS: S1-S2 regular Abdomen: Soft and nontender Extremities: No edema  Impression: Acute systolic and diastolic heart failure: Stable Multivessel CAD-status post PCI  Plan: She was discharged yesterday-however patient is family refused to take her home-she appears to be stable for discharge today. See discharge summary done on 10/4. She is aware that she needs to follow-up with cardiology as instructed.  Total time spent 15 minutes.

## 2017-09-14 NOTE — Progress Notes (Signed)
Pt declined ambulation. Sts she is so tired and awaiting a bath. Encouraged her to walk at home. Discussed HF booklet with pt. She was able to remember 2000 mg restriction of sodium that we discussed yesterday. I reinforced daily wts, walking daily, CRPII, and Plavix. Pt voiced understanding. She seems to struggle with math however.  [place] CES, ACSM 9:12 AM 09/14/2017

## 2017-09-14 NOTE — Progress Notes (Signed)
PT Cancellation Note  Patient Details Name: Maureen Jackson MRN: [id number] DOB: 1956-06-11   Cancelled Treatment:    Reason Eval/Treat Not Completed: Other (comment) (pt in the middle of D/c instructions and declined gait or participation at this time)   Hidaya Daniel B Max Nuno 09/14/2017, 10:21 AM  Elwyn Reach, [place]

## 2017-09-15 DIAGNOSIS — R69 Illness, unspecified: Secondary | ICD-10-CM | POA: Diagnosis not present

## 2017-09-17 ENCOUNTER — Ambulatory Visit (HOSPITAL_COMMUNITY): Payer: Medicare Other

## 2017-09-18 ENCOUNTER — Ambulatory Visit (HOSPITAL_COMMUNITY)
Admission: RE | Admit: 2017-09-18 | Discharge: 2017-09-18 | Disposition: A | Discharge status: Home / Self Care | Payer: Medicare HMO | Source: Ambulatory Visit | Attending: Physician Assistant | Admitting: Physician Assistant

## 2017-09-18 DIAGNOSIS — I313 Pericardial effusion (noninflammatory): Secondary | ICD-10-CM | POA: Insufficient documentation

## 2017-09-18 DIAGNOSIS — I3139 Other pericardial effusion (noninflammatory): Secondary | ICD-10-CM

## 2017-09-18 NOTE — Progress Notes (Signed)
*  PRELIMINARY RESULTS* Echocardiogram Limited 2-D Echocardiogram has been performed.  Maureen Jackson 09/18/2017, 3:29 PM

## 2017-09-18 NOTE — Progress Notes (Deleted)
Cardiology Office Note    Date:  09/18/2017   ID:  Maureen Jackson, DOB [birth year], MRN [id number]  PCP:  Patient, No Pcp Per  Cardiologist:   No chief complaint on file.   History of Present Illness:  Maureen Jackson is a 61 y.o. female with CAD not felt to be candidate for CABG status post successful PCI/DES to RCA and LAD 09/12/17. Also had acute on chronic combined systolic and diastolic CHF, hypertension, morbid obesity, DM, pericardial effusion on echo 09/10/17 without signs of tamponade not..  Past Medical History:  Diagnosis Date  . 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 Coronary artery disease   . Diabetes mellitus   . Dyspnea   . Glaucoma   . Hypertension   . Morbid obesity ([place])   . Obstructive sleep apnea    does not wear CPAP  . Thyroid disease     Past Surgical History:  Procedure Laterality Date  . ABDOMINAL HYSTERECTOMY    . CARDIAC CATHETERIZATION  09/12/2017  . CORONARY STENT INTERVENTION  09/12/2017   STENT RESOLUTE ONYX 2.70J50 drug eluting stent was successfully placed  . CORONARY STENT INTERVENTION N/A 09/12/2017   Procedure: CORONARY STENT INTERVENTION;  Surgeon: Leonie Man, MD;  Location: [facility];  Service: Cardiovascular;  Laterality: N/A;  . LEFT HEART CATH AND CORONARY ANGIOGRAPHY N/A 09/12/2017   Procedure: LEFT HEART CATH AND CORONARY ANGIOGRAPHY;  Surgeon: Leonie Man, MD;  Location: [facility];  Service: Cardiovascular;  Laterality: N/A;  . RIGHT/LEFT HEART CATH AND CORONARY ANGIOGRAPHY N/A 09/10/2017   Procedure: RIGHT/LEFT HEART CATH AND CORONARY ANGIOGRAPHY;  Surgeon: Troy Sine, MD;  Location: [facility];  Service: Cardiovascular;  Laterality: N/A;  . THYROID SURGERY      Current Medications: No outpatient prescriptions have been marked as taking for the 09/19/17 encounter (Appointment) with [name], PA-C.     Allergies:   Aspirin   Social History   Social History  . Marital status:  Widowed    Spouse name: N/A  . Number of children: N/A  . Years of education: N/A   Occupational History  . 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 Smoking status: Former Research scientist (life sciences)  . Smokeless tobacco: Never Used     Comment: QUIT IN 2005  . Alcohol use No  . Drug use: No  . Sexual activity: No   Other Topics Concern  . Not on file   Social History Narrative  . No narrative on file     Family History:  The patient's ***family history includes Cancer in her mother; Diabetes in her mother; Hypertension in her mother and sister.   ROS:   Please see the history of present illness.    ROS All other systems reviewed and are negative.   PHYSICAL EXAM:   VS:  There were no vitals taken for this visit.  Physical Exam  GEN: Well nourished, well developed, in no acute distress  HEENT: normal  Neck: no JVD, carotid bruits, or masses Cardiac:RRR; no murmurs, rubs, or gallops  Respiratory:  clear to auscultation bilaterally, normal work of breathing GI: soft, nontender, nondistended, + BS Ext: without cyanosis, clubbing, or edema, Good distal pulses bilaterally MS: no deformity or atrophy  Skin: warm and dry, no rash Neuro:  Alert and Oriented x 3, Strength and sensation are intact Psych: euthymic mood, full affect  Wt Readings from Last 3 Encounters:  09/12/17 (!) 312 lb 11.2 oz (141.8 kg)  10/02/15 (!) 350 lb (158.8 kg)  08/05/15 (!) 350 lb (158.8 kg)      Studies/Labs Reviewed:   EKG:  EKG is*** ordered today.  The ekg ordered today demonstrates ***  Recent Labs: 09/05/2017: ALT 34; B Natriuretic Peptide 386.0; TSH 14.361 09/13/2017: BUN 30; Creatinine, Ser 1.39; Hemoglobin 9.6; Platelets 184; Potassium 4.7; Sodium 135   Lipid Panel    Component Value Date/Time   CHOL 124 09/09/2017 0239   TRIG 177 (H) 09/09/2017 0239   HDL 32 (L) 09/09/2017 0239   CHOLHDL 3.9 09/09/2017 0239   VLDL 35 09/09/2017 0239   LDLCALC 57 09/09/2017  0239    Additional studies/ records that were reviewed today include:  Procedures/Studies: 10/3>>PCI to LAD and RCA   09/10/17>>Right and left heart cath   Prox RCA lesion, 30 %stenosed.  Prox RCA to Mid RCA lesion, 70 %stenosed.  Mid RCA lesion, 90 %stenosed.  RPDA lesion, 80 %stenosed.  Prox Cx lesion, 20 %stenosed.  Ost 1st Mrg lesion, 80 %stenosed.  1st Mrg-2 lesion, 80 %stenosed.  1st Mrg-1 lesion, 90 %stenosed.  Mid Cx-2 lesion, 50 %stenosed.  Mid Cx-1 lesion, 60 %stenosed.  Prox LAD lesion, 60 %stenosed.   10/1>>TEE:   -This was a limited study for pericardial effusion evaluation.   When compared to the prior study from 09/06/2017 there is   unchanged amount of moderate circumferential pericardial effusion   - 2 cm around the right ventricle and posteriorly.   There is right atrial invagination and but complete RV filling in   diastole. IVC is collapsing.   No signs of tamponade.   A follow up study in 1 week is recommended         ASSESSMENT:    1. CAD, multiple vessel   2. Pericardial effusion   3. Acute combined systolic (congestive) and diastolic (congestive) heart failure ([place])   4. Essential hypertension   5. Morbid obesity (HCC)      PLAN:  In order of problems listed above:  CAD multivessel not a candidate for CABG and underwent DES to the RCA and LAD  Pericardial effusion  Combined systolic and diastolic CHF  Essential hypertension  Morbid obesity    Medication Adjustments/Labs and Tests Ordered: Current medicines are reviewed at length with the patient today.  Concerns regarding medicines are outlined above.  Medication changes, Labs and Tests ordered today are listed in the Patient Instructions below. There are no Patient Instructions on file for this visit.   Signed, Ermalinda Barrios, PA-C  09/18/2017 3:24 PM    [facility] [place], [place],   [address] Phone: [phone]; Fax: [phone]

## 2017-09-19 ENCOUNTER — Ambulatory Visit: Payer: Medicare Other | Admitting: Physician Assistant

## 2017-09-25 NOTE — Progress Notes (Signed)
Cardiology Office Note    Date:  09/26/2017   ID:  Maureen Jackson, DOB 1956-10-07, MRN [id number]  PCP:  Neale Burly, MD  Cardiologist: Dr. Harl Bowie   Chief Complaint  Patient presents with  . Follow-up    History of Present Illness:  Maureen Jackson is a 61 y.o. female who was in the hospital with chest pain and found to have multivessel disease by heart cath and felt to have poor targets not amendable to CABG. She therefore underwent successful PCI/DES to the RCA and LAD with excellent results 09/12/17. 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 Patient also has history of hypertension, type 2 diabetes mellitus, morbid obesity, OSA noncompliant with CPAP,mild acute kidney injury creatinine 1.39 at discharge.  Patient says she has a lot of energy since her stents. Able to walk to bus stop etc without trouble.Denies chest pain or dyspnea. Complains of a dry cough.  Past Medical History:  Diagnosis Date  . 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 Coronary artery disease   . Diabetes mellitus   . Dyspnea   . Glaucoma   . Hypertension   . Morbid obesity ([place])   . Obstructive sleep apnea    does not wear CPAP  . Thyroid disease     Past Surgical History:  Procedure Laterality Date  . ABDOMINAL HYSTERECTOMY    . CARDIAC CATHETERIZATION  09/12/2017  . CORONARY STENT INTERVENTION  09/12/2017   STENT RESOLUTE ONYX 1.85U31 drug eluting stent was successfully placed  . CORONARY STENT INTERVENTION N/A 09/12/2017   Procedure: CORONARY STENT INTERVENTION;  Surgeon: Leonie Man, MD;  Location: [facility];  Service: Cardiovascular;  Laterality: N/A;  . LEFT HEART CATH AND CORONARY ANGIOGRAPHY N/A 09/12/2017   Procedure: LEFT HEART CATH AND CORONARY ANGIOGRAPHY;   Surgeon: Leonie Man, MD;  Location: [facility];  Service: Cardiovascular;  Laterality: N/A;  . RIGHT/LEFT HEART CATH AND CORONARY ANGIOGRAPHY N/A 09/10/2017   Procedure: RIGHT/LEFT HEART CATH AND CORONARY ANGIOGRAPHY;  Surgeon: Troy Sine, MD;  Location: [facility];  Service: Cardiovascular;  Laterality: N/A;  . THYROID SURGERY      Current Medications: Current Meds  Medication Sig  . amLODipine (NORVASC) 5 MG tablet Take 1 tablet (5 mg total) by mouth daily.  Marland Kitchen aspirin 81 MG chewable tablet Chew 1 tablet (81 mg total) by mouth daily.  Marland Kitchen atorvastatin (LIPITOR) 80 MG tablet Take 1 tablet (80 mg total) by mouth daily at 6 PM.  . clopidogrel (PLAVIX) 75 MG tablet Take 1 tablet (75 mg total) by mouth daily with breakfast.  . furosemide (LASIX) 40 MG tablet Take 1 tablet (40 mg total) by mouth daily.  . isosorbide mononitrate (IMDUR) 30 MG 24 hr tablet Take 1 tablet (30 mg total) by mouth daily.  Marland Kitchen levothyroxine (SYNTHROID, LEVOTHROID) 200 MCG tablet Take 200 mcg by mouth daily.  Marland Kitchen losartan (COZAAR) 100 MG tablet Take 1 tablet (100 mg total) by mouth daily.  . metFORMIN (GLUCOPHAGE) 1000 MG tablet Take 1 tablet (1,000 mg total) by mouth 2 (two) times daily with a meal.  . VENTOLIN HFA 108 (90 Base) MCG/ACT inhaler Inhale 1-2 puffs into the lungs every 6 (six) hours as needed for wheezing or shortness  of breath.  . [DISCONTINUED] carvedilol (COREG) 6.25 MG tablet Take 1 tablet (6.25 mg total) by mouth 2 (two) times daily with a meal.     Allergies:   Aspirin   Social History   Social History  . Marital status: Widowed    Spouse name: N/A  . Number of children: N/A  . Years of education: N/A   Occupational History  . 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 Smoking status: Former Research scientist (life sciences)  . Smokeless tobacco: Never Used     Comment: QUIT IN 2005  . Alcohol use No  . Drug use: No  . Sexual activity: No   Other Topics  Concern  . None   Social History Narrative  . None     Family History:  The patient's family history includes Cancer in her mother; Diabetes in her mother; Hypertension in her mother and sister.   ROS:   Please see the history of present illness.    Review of Systems  HENT: Negative.   Eyes: Negative.   Cardiovascular: Positive for dyspnea on exertion.  Respiratory: Negative.   Hematologic/Lymphatic: Negative.   Musculoskeletal: Negative.  Negative for joint pain.  Gastrointestinal: Negative.   Genitourinary: Negative.    All other systems reviewed and are negative.   PHYSICAL EXAM:   VS:  BP 140/88   Pulse 93   Ht 5\' 5"  (1.651 m)   Wt (!) 311 lb (141.1 kg)   SpO2 96%   BMI 51.75 kg/m   Physical Exam  GEN: obese, in no acute distress  Neck: no JVD, carotid bruits, or masses Cardiac:RRR; no murmurs, rubs, or gallops  Respiratory:  clear to auscultation bilaterally, normal work of breathing GI: soft, nontender, nondistended, + BS Ext: right arm at cath site without hematoma or hemorrhage, good radial brachial pulseswithout cyanosis, clubbing, or edema, Good distal pulses bilaterally Neuro:  Alert and Oriented x 3 Psych: euthymic mood, full affect  Wt Readings from Last 3 Encounters:  09/26/17 (!) 311 lb (141.1 kg)  09/12/17 (!) 312 lb 11.2 oz (141.8 kg)  10/02/15 (!) 350 lb (158.8 kg)      Studies/Labs Reviewed:   EKG:  EKG is not ordered today.    Recent Labs: 09/05/2017: ALT 34; B Natriuretic Peptide 386.0; TSH 14.361 09/13/2017: BUN 30; Creatinine, Ser 1.39; Hemoglobin 9.6; Platelets 184; Potassium 4.7; Sodium 135   Lipid Panel    Component Value Date/Time   CHOL 124 09/09/2017 0239   TRIG 177 (H) 09/09/2017 0239   HDL 32 (L) 09/09/2017 0239   CHOLHDL 3.9 09/09/2017 0239   VLDL 35 09/09/2017 0239   LDLCALC 57 09/09/2017 0239    Additional studies/ records that were reviewed today include:  Limited echo 10/9/18Study Conclusions   - Impressions:  Compared to echo 09/10/17   Limited with no color flow and limited Doppler for respiratory   variation   Moderate LVE with diffuse hypokinesis worse in inferior wall EF   30-35%   Moderate pericardial effusion unchanged with no signs of   tamponade   Impressions:   - Compared to echo 09/10/17   Limited with no color flow and limited Doppler for respiratory   variation   Moderate LVE with diffuse hypokinesis worse in inferior wall EF   30-35%   Moderate pericardial effusion unchanged with no signs of   tamponade      2-D echo 9/27/18Study Conclusions   - Procedure narrative: Transthoracic  echocardiography. Image   quality was fair. The study was technically difficult, as a   result of poor sound wave transmission. Intravenous contrast   (Definity) was administered. - Left ventricle: The cavity size was mildly dilated. Systolic   function was moderately to severely reduced. The estimated   ejection fraction was in the range of 30% to 35%. Diffuse   hypokinesis. Doppler parameters are consistent with abnormal left   ventricular relaxation (grade 1 diastolic dysfunction). Mild   concentric and moderate focal basal septal hypertrophy. - Ventricular septum: Septal motion showed abnormal function and   dyssynergy. These changes are consistent with a left bundle   branch block. - Aortic valve: Mildly calcified annulus. - Systemic veins: IVC is dilated with normal respiratory variation.   It was suboptimally visualized. Estimated right atrial pressure:   8 mmHg. - Pericardium, extracardiac: Moderate size pericardial effusion.   There is some right atrial inversion noted, but no frank   tamponade physiology.   Cardiac catheterization 10/1/18Conclusion     Prox RCA lesion, 30 %stenosed.  Prox RCA to Mid RCA lesion, 70 %stenosed.  Mid RCA lesion, 90 %stenosed.  RPDA lesion, 80 %stenosed.  Prox Cx lesion, 20 %stenosed.  Ost 1st Mrg lesion, 80 %stenosed.  1st Mrg-2 lesion,  80 %stenosed.  1st Mrg-1 lesion, 90 %stenosed.  Mid Cx-2 lesion, 50 %stenosed.  Mid Cx-1 lesion, 60 %stenosed.  Prox LAD lesion, 60 %stenosed.  Mid LAD lesion, 85 %stenosed.   Severe multivessel CAD with 60% proximal LAD stenosis and 85% mid LAD stenosis; 80, 90 and 80%  OM1 stenosis of the left circumflex with 60% mid AV groove and 50% mid distal AV groove stenosis; dominant RCA with 30% proximal followed by 70% proximal stenosis, 90% mid stenosis, and diffuse mid distal PDA stenosis of at least 80%.   LVEDP 33 mmHg;  EF by previous echo 30-35%; left ventriculography not performed.   Moderate right heart pressure elevation with mild/moderate pulmonary hypertension.   RECOMMENDATION: In this diabetic female with significant multivessel CAD, recommend surgical consultation for consideration for CABG revascularization.     PCI/DES 09/12/17 CORONARY ANGIOGRAPHY  Conclusion     LESION #1  Prox LAD lesion, 60 %stenosed. Prox LAD to Mid LAD lesion, 65 %stenosed. Mid LAD lesion, 85 %stenosed. Tandem Lesions.  A STENT RESOLUTE ONYX [id number] drug eluting stent was successfully placed. Post-dilated in tapered fashion: 3.6 - 2.9 mm  Post intervention, there is a 0% residual stenosis.  LESION $2  Prox RCA lesion, 45 %stenosed. Prox RCA to Mid RCA lesion, 70 %stenosed. Mid RCA lesion, 90 %stenosed. Tandem Lesions  A STENT RESOLUTE ONYX [id number] drug eluting stent and STENT RESOLUTE ONYX 2.75X26 drug-eluting stent were successfully placed in overlapping fashion.  Post intervention, there is a 0% residual stenosis. Post-dilated in tapered fashion: 3.6 - 3.1 mm  RPDA lesion, 80 %stenosed. Plan Medical management.   Successful 2 vessel PCI with stable circumflex disease. Plan is to treat circumflex flex disease medically for now.     Plan: Return to nursing unit for post PCI care TR band removal. Continue to optimize medical management, but from a post Standpoint she will be able to be  discharged tomorrow. Continue aspirin plus Plavix for minimum one year, but would continue Plavix lifelong         ASSESSMENT:    1. CAD, multiple vessel   2. Pericardial effusion   3. Acute combined systolic (congestive) and diastolic (congestive) heart failure ([place])   4. Essential  hypertension   5. AKI (acute kidney injury) ([place])      PLAN:  In order of problems listed above:  CAD multivessel status post stenting to the RCA and LAD with residual stable circumflex disease 09/12/17. Vessels not amendable to CABG.patient feels much better with less dyspnea on exertion and walking further distances. BP up and heart rate 93 so will increase Coreg to 12.5 mg twice a day. Have her see Dr. Harl Bowie back in 2-3 weeks.  Moderate pericardial effusion without evidence of tamponade, stable on follow-up echo 45/9/97  Chronic Systolic and diastolic CHF EF 74% well compensated  Essential hypertension BP up a little bit today will increase Coreg.  Acute kidney injury in the hospital needs follow-up bmet    Medication Adjustments/Labs and Tests Ordered: Current medicines are reviewed at length with the patient today.  Concerns regarding medicines are outlined above.  Medication changes, Labs and Tests ordered today are listed in the Patient Instructions below. Patient Instructions  Medication Instructions:  Your physician has recommended you make the following change in your medication:  Increase Coreg to 12.5 mg Two Times Daily    Labwork: Your physician recommends that you return for lab work Today    Testing/Procedures: NONE   Follow-Up: Your physician recommends that you schedule a follow-up appointment in: 2-3 Weeks with Dr. Harl Bowie.    Any Other Special Instructions Will Be Listed Below (If Applicable).     If you need a refill on your cardiac medications before your next appointment, please call your pharmacy.  Thank you for choosing [facility]!        Sumner Boast, PA-C  09/26/2017 11:42 AM    [facility] [address], [place], [place]  [address] Phone: [phone]; Fax: [phone]

## 2017-09-26 ENCOUNTER — Encounter: Payer: Self-pay | Admitting: Physician Assistant

## 2017-09-26 ENCOUNTER — Ambulatory Visit (INDEPENDENT_AMBULATORY_CARE_PROVIDER_SITE_OTHER): Payer: Medicare HMO | Admitting: Physician Assistant

## 2017-09-26 ENCOUNTER — Other Ambulatory Visit (HOSPITAL_COMMUNITY)
Admission: RE | Admit: 2017-09-26 | Discharge: 2017-09-26 | Disposition: A | Discharge status: Home / Self Care | Payer: Medicare HMO | Source: Ambulatory Visit | Attending: Physician Assistant | Admitting: Physician Assistant

## 2017-09-26 VITALS — BP 140/88 | HR 93 | Ht 65.0 in | Wt 311.0 lb

## 2017-09-26 DIAGNOSIS — N179 Acute kidney failure, unspecified: Secondary | ICD-10-CM

## 2017-09-26 DIAGNOSIS — I313 Pericardial effusion (noninflammatory): Secondary | ICD-10-CM | POA: Diagnosis not present

## 2017-09-26 DIAGNOSIS — I5041 Acute combined systolic (congestive) and diastolic (congestive) heart failure: Secondary | ICD-10-CM

## 2017-09-26 DIAGNOSIS — I3139 Other pericardial effusion (noninflammatory): Secondary | ICD-10-CM

## 2017-09-26 DIAGNOSIS — I10 Essential (primary) hypertension: Secondary | ICD-10-CM | POA: Diagnosis not present

## 2017-09-26 DIAGNOSIS — I2510 Atherosclerotic heart disease of native coronary artery without angina pectoris: Secondary | ICD-10-CM

## 2017-09-26 LAB — BASIC METABOLIC PANEL
ANION GAP: 9 (ref 5–15)
BUN: 25 mg/dL — ABNORMAL HIGH (ref 6–20)
CO2: 27 mmol/L (ref 22–32)
Calcium: 9 mg/dL (ref 8.9–10.3)
Chloride: 104 mmol/L (ref 101–111)
Creatinine, Ser: 1.04 mg/dL — ABNORMAL HIGH (ref 0.44–1.00)
GFR calc non Af Amer: 58 mL/min — ABNORMAL LOW (ref >60)
Glucose, Bld: 166 mg/dL — ABNORMAL HIGH (ref 65–99)
POTASSIUM: 4.5 mmol/L (ref 3.5–5.1)
SODIUM: 140 mmol/L (ref 135–145)

## 2017-09-26 MED ORDER — CARVEDILOL 12.5 MG PO TABS: 12.5000 mg | ORAL_TABLET | Freq: Two times a day (BID) | ORAL | 3 refills | Status: DC

## 2017-09-26 NOTE — Patient Instructions (Signed)
Medication Instructions:  Your physician has recommended you make the following change in your medication:  Increase Coreg to 12.5 mg Two Times Daily    Labwork: Your physician recommends that you return for lab work Today    Testing/Procedures: NONE   Follow-Up: Your physician recommends that you schedule a follow-up appointment in: 2-3 Weeks with Dr. Harl Bowie.    Any Other Special Instructions Will Be Listed Below (If Applicable).     If you need a refill on your cardiac medications before your next appointment, please call your pharmacy.  Thank you for choosing [facility]!

## 2017-10-09 DIAGNOSIS — I10 Essential (primary) hypertension: Secondary | ICD-10-CM | POA: Diagnosis not present

## 2017-10-09 DIAGNOSIS — Z0000 Encounter for general adult medical examination without abnormal findings: Secondary | ICD-10-CM | POA: Diagnosis not present

## 2017-10-09 DIAGNOSIS — Z79899 Other long term (current) drug therapy: Secondary | ICD-10-CM | POA: Diagnosis not present

## 2017-10-09 DIAGNOSIS — E1165 Type 2 diabetes mellitus with hyperglycemia: Secondary | ICD-10-CM | POA: Diagnosis not present

## 2017-10-09 DIAGNOSIS — E038 Other specified hypothyroidism: Secondary | ICD-10-CM | POA: Diagnosis not present

## 2017-10-09 DIAGNOSIS — H35033 Hypertensive retinopathy, bilateral: Secondary | ICD-10-CM | POA: Diagnosis not present

## 2017-10-09 DIAGNOSIS — Z6841 Body Mass Index (BMI) 40.0 and over, adult: Secondary | ICD-10-CM | POA: Diagnosis not present

## 2017-10-19 ENCOUNTER — Ambulatory Visit (INDEPENDENT_AMBULATORY_CARE_PROVIDER_SITE_OTHER): Payer: Medicare HMO | Admitting: Cardiology

## 2017-10-19 ENCOUNTER — Encounter: Payer: Self-pay | Admitting: Cardiology

## 2017-10-19 VITALS — BP 142/82 | HR 95 | Ht 65.0 in | Wt 308.0 lb

## 2017-10-19 DIAGNOSIS — R002 Palpitations: Secondary | ICD-10-CM | POA: Diagnosis not present

## 2017-10-19 DIAGNOSIS — I2510 Atherosclerotic heart disease of native coronary artery without angina pectoris: Secondary | ICD-10-CM | POA: Diagnosis not present

## 2017-10-19 DIAGNOSIS — I5022 Chronic systolic (congestive) heart failure: Secondary | ICD-10-CM

## 2017-10-19 DIAGNOSIS — I3139 Other pericardial effusion (noninflammatory): Secondary | ICD-10-CM

## 2017-10-19 DIAGNOSIS — I313 Pericardial effusion (noninflammatory): Secondary | ICD-10-CM

## 2017-10-19 MED ORDER — CARVEDILOL 25 MG PO TABS: 25.0000 mg | ORAL_TABLET | Freq: Two times a day (BID) | ORAL | 3 refills | Status: DC

## 2017-10-19 NOTE — Patient Instructions (Signed)
Medication Instructions:  INCREASE COREG TO 25 MG TWO TIMES DAILY  Labwork: BMET MAGNESIUM   Testing/Procedures: NONE  Follow-Up: Your physician recommends that you schedule a follow-up appointment in: 3 WEEKS    Any Other Special Instructions Will Be Listed Below (If Applicable).  DR. Meda Coffee [phone]   If you need a refill on your cardiac medications before your next appointment, please call your pharmacy.

## 2017-10-19 NOTE — Progress Notes (Signed)
Clinical Summary Ms. Werntz is a 61 y.o.female seen today for follow up of the following medical problems.   1. Chronic systolic HF/ICM/CAD - patient admitted with acute onset CHF 08/2017.  - echo 08/2017 LVEF 30-35%, grade I diastoilc dysfunction - 09/2017 cath as reported below, found to have severe multivessel disease of LAD, LCX, and RCA. Mean PA 33, PCWP 25, CI 2.7 - seen by CT surgery, recs were for PCI as opposed to CABG. Received DES to LAD and DES x 2 to RCA, LCX disease managed medically. Recs for lifelong plavix per interventional cards - discharge weight 09/13/17 312 lbs  - denies any significant SOB/DOE since discharge. Home weights trending down - she is compliant with meds, limiting sodium intake.    2. Hypothyroidism - TSH 14 during recent admission - replacement therapy per pcp   3. Pericardial effusion - 09/2017 echo moderate pericardial effusion, stable by serial imaging without evidence of tamponade - patient with severe hypothyroidism, probable cause.     Past Medical History:  Diagnosis Date  . 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 Coronary artery disease   . Diabetes mellitus   . Dyspnea   . Glaucoma   . Hypertension   . Morbid obesity ([place])   . Obstructive sleep apnea    does not wear CPAP  . Thyroid disease      Allergies  Allergen Reactions  . Aspirin Nausea Only     Current Outpatient Medications  Medication Sig Dispense Refill  . amLODipine (NORVASC) 5 MG tablet Take 1 tablet (5 mg total) by mouth daily. 60 tablet 0  . aspirin 81 MG chewable tablet Chew 1 tablet (81 mg total) by mouth daily. 60 tablet 0  . atorvastatin (LIPITOR) 80 MG tablet Take 1 tablet (80 mg total) by mouth daily at 6 PM. 60 tablet 0  . carvedilol (COREG) 12.5 MG tablet Take 1 tablet (12.5 mg total) by mouth 2 (two) times daily. 180 tablet 3  . clopidogrel (PLAVIX) 75 MG tablet Take 1 tablet (75 mg total) by mouth daily with breakfast. 60 tablet 0  . furosemide  (LASIX) 40 MG tablet Take 1 tablet (40 mg total) by mouth daily. 30 tablet 0  . isosorbide mononitrate (IMDUR) 30 MG 24 hr tablet Take 1 tablet (30 mg total) by mouth daily. 60 tablet 0  . levothyroxine (SYNTHROID, LEVOTHROID) 200 MCG tablet Take 200 mcg by mouth daily.    Marland Kitchen losartan (COZAAR) 100 MG tablet Take 1 tablet (100 mg total) by mouth daily. 60 tablet 0  . metFORMIN (GLUCOPHAGE) 1000 MG tablet Take 1 tablet (1,000 mg total) by mouth 2 (two) times daily with a meal. 60 tablet 0  . VENTOLIN HFA 108 (90 Base) MCG/ACT inhaler Inhale 1-2 puffs into the lungs every 6 (six) hours as needed for wheezing or shortness of breath. 1 Inhaler 0   No current facility-administered medications for this visit.      Past Surgical History:  Procedure Laterality Date  . ABDOMINAL HYSTERECTOMY    . CARDIAC CATHETERIZATION  09/12/2017  . CORONARY STENT INTERVENTION  09/12/2017   STENT RESOLUTE ONYX 8.14G81 drug eluting stent was successfully placed  . THYROID SURGERY       Allergies  Allergen Reactions  . Aspirin Nausea Only      Family History  Problem Relation Age of Onset  . Diabetes Mother   . Hypertension Mother   . Cancer Mother  pancreas  . Hypertension Sister      Social History Ms. Couillard reports that she has quit smoking. she has never used smokeless tobacco. Ms. Alcantar reports that she does not drink alcohol.   Review of Systems CONSTITUTIONAL: No weight loss, fever, chills, weakness or fatigue.  HEENT: Eyes: No visual loss, blurred vision, double vision or yellow sclerae.No hearing loss, sneezing, congestion, runny nose or sore throat.  SKIN: No rash or itching.  CARDIOVASCULAR: per hpi RESPIRATORY: No shortness of breath, cough or sputum.  GASTROINTESTINAL: No anorexia, nausea, vomiting or diarrhea. No abdominal pain or blood.  GENITOURINARY: No burning on urination, no polyuria NEUROLOGICAL: No headache, dizziness, syncope, paralysis, ataxia, numbness or  tingling in the extremities. No change in bowel or bladder control.  MUSCULOSKELETAL: No muscle, back pain, joint pain or stiffness.  LYMPHATICS: No enlarged nodes. No history of splenectomy.  PSYCHIATRIC: No history of depression or anxiety.  ENDOCRINOLOGIC: No reports of sweating, cold or heat intolerance. No polyuria or polydipsia.  Marland Kitchen   Physical Examination Vitals:   10/19/17 1045  BP: (!) 142/82  Pulse: 95  SpO2: 94%   Vitals:   10/19/17 1045  Weight: (!) 308 lb (139.7 kg)  Height: 5\' 5"  (1.651 m)    Gen: resting comfortably, no acute distress HEENT: no scleral icterus, pupils equal round and reactive, no palptable cervical adenopathy,  CV: RRR, no m/r/g, no jvd Resp: Clear to auscultation bilaterally GI: abdomen is soft, non-tender, non-distended, normal bowel sounds, no hepatosplenomegaly MSK: extremities are warm, no edema.  Skin: warm, no rash Neuro:  no focal deficits Psych: appropriate affect   Diagnostic Studies 09/2017 diagnostic cath  Prox RCA lesion, 30 %stenosed.  Prox RCA to Mid RCA lesion, 70 %stenosed.  Mid RCA lesion, 90 %stenosed.  RPDA lesion, 80 %stenosed.  Prox Cx lesion, 20 %stenosed.  Ost 1st Mrg lesion, 80 %stenosed.  1st Mrg-2 lesion, 80 %stenosed.  1st Mrg-1 lesion, 90 %stenosed.  Mid Cx-2 lesion, 50 %stenosed.  Mid Cx-1 lesion, 60 %stenosed.  Prox LAD lesion, 60 %stenosed.  Mid LAD lesion, 85 %stenosed.   Severe multivessel CAD with 60% proximal LAD stenosis and 85% mid LAD stenosis; 80, 90 and 80%  OM1 stenosis of the left circumflex with 60% mid AV groove and 50% mid distal AV groove stenosis; dominant RCA with 30% proximal followed by 70% proximal stenosis, 90% mid stenosis, and diffuse mid distal PDA stenosis of at least 80%.  LVEDP 33 mmHg;  EF by previous echo 30-35%; left ventriculography not performed.  Moderate right heart pressure elevation with mild/moderate pulmonary hypertension.  RECOMMENDATION: In this  diabetic female with significant multivessel CAD, recommend surgical consultation for consideration for CABG revascularization.  09/2017 PCI  LESION #1  Prox LAD lesion, 60 %stenosed. Prox LAD to Mid LAD lesion, 65 %stenosed. Mid LAD lesion, 85 %stenosed. Tandem Lesions.  A STENT RESOLUTE ONYX [id number] drug eluting stent was successfully placed. Post-dilated in tapered fashion: 3.6 - 2.9 mm  Post intervention, there is a 0% residual stenosis.  LESION $2  Prox RCA lesion, 45 %stenosed. Prox RCA to Mid RCA lesion, 70 %stenosed. Mid RCA lesion, 90 %stenosed. Tandem Lesions  A STENT RESOLUTE ONYX [id number] drug eluting stent and STENT RESOLUTE ONYX 2.75X26 drug-eluting stent were successfully placed in overlapping fashion.  Post intervention, there is a 0% residual stenosis. Post-dilated in tapered fashion: 3.6 - 3.1 mm  RPDA lesion, 80 %stenosed. Plan Medical management.   Successful 2 vessel PCI with stable circumflex disease.  Plan is to treat circumflex flex disease medically for now.   Plan: Return to nursing unit for post PCI care TR band removal. Continue to optimize medical management, but from a post Standpoint she will be able to be discharged tomorrow. Continue aspirin plus Plavix for minimum one year, but would continue Plavix lifelong     08/2017 echo Study Conclusions  - Procedure narrative: Transthoracic echocardiography. Image   quality was fair. The study was technically difficult, as a   result of poor sound wave transmission. Intravenous contrast   (Definity) was administered. - Left ventricle: The cavity size was mildly dilated. Systolic   function was moderately to severely reduced. The estimated   ejection fraction was in the range of 30% to 35%. Diffuse   hypokinesis. Doppler parameters are consistent with abnormal left   ventricular relaxation (grade 1 diastolic dysfunction). Mild   concentric and moderate focal basal septal hypertrophy. - Ventricular  septum: Septal motion showed abnormal function and   dyssynergy. These changes are consistent with a left bundle   Nuriyah Hanline block. - Aortic valve: Mildly calcified annulus. - Systemic veins: IVC is dilated with normal respiratory variation.   It was suboptimally visualized. Estimated right atrial pressure:   8 mmHg. - Pericardium, extracardiac: Moderate size pericardial effusion.   There is some right atrial inversion noted, but no frank   tamponade physiology.   Assessment and Plan  1. Chronic systolic HF - new diagnosis during recent 08/2017 admission - cath with multivessel disease, s/p stents to LAD and RCA.  - no current symptoms.  - we will increase coreg to 25mg  bid today - recheck BMET/Mg on home diuretics  2. CAD - cath with multivessel disease, s/p stents to LAD and RCA.  - no recent symptoms, continue current meds including DAPT at least 1 year  3. Pericardial effusion - stable by serial imaging, continue to monitor. Potentially related to her severe hypthyroidism   F/u 3 weeks. Would add aldactone next visit pending renal function, K, and bp's. If tolerates, in near future consider change to entresto. Can d/c norvasc if room with bp is needed.     Arnoldo Lenis, M.D.

## 2017-10-24 ENCOUNTER — Encounter: Payer: Self-pay | Admitting: Cardiology

## 2017-10-30 ENCOUNTER — Telehealth: Payer: Self-pay

## 2017-10-30 ENCOUNTER — Other Ambulatory Visit (HOSPITAL_COMMUNITY)
Admission: RE | Admit: 2017-10-30 | Discharge: 2017-10-30 | Disposition: A | Discharge status: Home / Self Care | Payer: Medicare HMO | Source: Ambulatory Visit | Attending: Cardiology | Admitting: Cardiology

## 2017-10-30 DIAGNOSIS — R002 Palpitations: Secondary | ICD-10-CM | POA: Diagnosis not present

## 2017-10-30 LAB — BASIC METABOLIC PANEL
Anion gap: 8 (ref 5–15)
BUN: 25 mg/dL — AB (ref 6–20)
CALCIUM: 8.9 mg/dL (ref 8.9–10.3)
CO2: 26 mmol/L (ref 22–32)
CREATININE: 0.9 mg/dL (ref 0.44–1.00)
Chloride: 105 mmol/L (ref 101–111)
GFR calc Af Amer: >60 mL/min (ref >60)
GFR calc non Af Amer: >60 mL/min (ref >60)
GLUCOSE: 176 mg/dL — AB (ref 65–99)
Potassium: 4.1 mmol/L (ref 3.5–5.1)
SODIUM: 139 mmol/L (ref 135–145)

## 2017-10-30 LAB — MAGNESIUM: MAGNESIUM: 1.8 mg/dL (ref 1.7–2.4)

## 2017-10-30 NOTE — Telephone Encounter (Signed)
-----   Message from Arnoldo Lenis, MD sent at 10/30/2017  1:20 PM EST ----- Labs look good  Zandra Abts MD

## 2017-10-30 NOTE — Telephone Encounter (Signed)
Called pt., no answer. Left message for pt to return call.  

## 2017-10-31 NOTE — Telephone Encounter (Signed)
Returning call for results--can be reached @ [phone]

## 2017-10-31 NOTE — Telephone Encounter (Signed)
Attempted to call back, go answering machine,[email or web address]

## 2017-11-06 ENCOUNTER — Telehealth: Payer: Self-pay | Admitting: Cardiology

## 2017-11-06 DIAGNOSIS — I255 Ischemic cardiomyopathy: Secondary | ICD-10-CM | POA: Insufficient documentation

## 2017-11-06 NOTE — Telephone Encounter (Signed)
[facility] is telling the pt that they're waiting for an approval from [place] to fill her medication, pt lvm did not say which medication

## 2017-11-06 NOTE — Progress Notes (Signed)
Cardiology Office Note    Date:  11/07/2017   ID:  Maureen Jackson, DOB 06/14/1956, MRN [id number]  PCP:  Neale Burly, MD  Cardiologist: Dr. Harl Bowie  Chief Complaint  Patient presents with  . Follow-up    History of Present Illness:  Maureen Jackson is a 61 y.o. female with history of CAD status post DES to the LAD and DES to the RCA x2, circumflex disease managed medically, lifelong Plavix recommended on cath 09/2017, ischemic cardiomyopathy ejection fraction 30-35% with grade 1 DD echo 08/2017, moderate pericardial effusion on echo 09/2017 stable by serial imaging without evidence of tamponade.  Severe hypothyroidism probably the cause.  TSH was 14 being replaced by PCP.  Last saw Dr. Harl Bowie 10/19/17 who increased his Coreg to 25 mg twice daily and recommended adding Aldactone next visit pending renal function and potassium and blood pressures.  In future consider Entresto.  Labs reviewed from 10/30/17 creatinine stable at 0.90, potassium 4.1.  Patient comes in today accompanied by her daughter and sister.  She was not feeling well last night so just went to bed.  This morning she was feeling fine but did not take her medications before she came here.  Her blood pressure is elevated.  She has not eaten anything and wanted to wait till she ate.  She is not taking the proper carvedilol dose.  She is only taken 12.5 mg twice daily rather than 25 mg twice daily.  No significant shortness of breath or edema.  No chest pain.    Past Medical History:  Diagnosis Date  . 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 Coronary artery disease   . Diabetes mellitus   . Dyspnea   . Glaucoma   . Hypertension   . Morbid obesity ([place])   . Obstructive sleep apnea    does not wear CPAP  . Thyroid disease     Past Surgical History:  Procedure Laterality Date  . ABDOMINAL HYSTERECTOMY    . CARDIAC CATHETERIZATION  09/12/2017  . CORONARY STENT INTERVENTION  09/12/2017   STENT RESOLUTE ONYX 4.43X54 drug  eluting stent was successfully placed  . CORONARY STENT INTERVENTION N/A 09/12/2017   Procedure: CORONARY STENT INTERVENTION;  Surgeon: Leonie Man, MD;  Location: [facility];  Service: Cardiovascular;  Laterality: N/A;  . LEFT HEART CATH AND CORONARY ANGIOGRAPHY N/A 09/12/2017   Procedure: LEFT HEART CATH AND CORONARY ANGIOGRAPHY;  Surgeon: Leonie Man, MD;  Location: [facility];  Service: Cardiovascular;  Laterality: N/A;  . RIGHT/LEFT HEART CATH AND CORONARY ANGIOGRAPHY N/A 09/10/2017   Procedure: RIGHT/LEFT HEART CATH AND CORONARY ANGIOGRAPHY;  Surgeon: Troy Sine, MD;  Location: [facility];  Service: Cardiovascular;  Laterality: N/A;  . THYROID SURGERY      Current Medications: Current Meds  Medication Sig  . amLODipine (NORVASC) 5 MG tablet Take 1 tablet (5 mg total) by mouth daily.  Marland Kitchen aspirin 81 MG chewable tablet Chew 1 tablet (81 mg total) by mouth daily.  Marland Kitchen atorvastatin (LIPITOR) 80 MG tablet Take 1 tablet (80 mg total) by mouth daily at 6 PM.  . carvedilol (COREG) 25 MG tablet Take 1 tablet (25 mg total) by mouth 2 (two) times daily.  . clopidogrel (PLAVIX) 75 MG tablet Take 1 tablet (75 mg total) by mouth daily with breakfast.  . furosemide (LASIX) 40 MG tablet Take 1 tablet (40 mg total) by mouth daily.  . isosorbide mononitrate (  IMDUR) 30 MG 24 hr tablet Take 1 tablet (30 mg total) by mouth daily.  Marland Kitchen levothyroxine (SYNTHROID, LEVOTHROID) 200 MCG tablet TK 1 T PO QD  . levothyroxine (SYNTHROID, LEVOTHROID) 50 MCG tablet TK 1 T PO D WITH 200MCG TABLET FOR A TOTAL DAILY DOSE OF 250MCG  . losartan (COZAAR) 100 MG tablet Take 1 tablet (100 mg total) by mouth daily.  . metFORMIN (GLUCOPHAGE) 1000 MG tablet Take 1 tablet (1,000 mg total) by mouth 2 (two) times daily with a meal.  . VENTOLIN HFA 108 (90 Base) MCG/ACT inhaler Inhale 1-2 puffs into the lungs every 6 (six) hours as needed for wheezing or shortness of breath.     Allergies:   Aspirin     Social History   Socioeconomic History  . Marital status: Widowed    Spouse name: None  . Number of children: None  . Years of education: None  . Highest education level: None  Social Needs  . Financial resource strain: None  . Food insecurity - worry: None  . Food insecurity - inability: None  . Transportation needs - medical: None  . Transportation needs - non-medical: None  Occupational History  . 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 Smoking status: Former Research scientist (life sciences)  . Smokeless tobacco: Never Used  . Tobacco comment: QUIT IN 2005  Substance and Sexual Activity  . Alcohol use: No  . Drug use: No  . Sexual activity: No  Other Topics Concern  . None  Social History Narrative  . None     Family History:  The patient's family history includes Cancer in her mother; Diabetes in her mother; Hypertension in her mother and sister.   ROS:   Please see the history of present illness.    Review of Systems  Constitution: Positive for weakness and malaise/fatigue.  HENT: Negative.   Eyes: Negative.   Cardiovascular: Negative.   Respiratory: Negative.   Hematologic/Lymphatic: Negative.   Musculoskeletal: Positive for back pain. Negative for joint pain.  Gastrointestinal: Positive for nausea.  Genitourinary: Negative.    All other systems reviewed and are negative.   PHYSICAL EXAM:   VS:  BP (!) 196/104   Pulse 82   Ht 5\' 5"  (1.651 m)   Wt (!) 304 lb (137.9 kg)   SpO2 97%   BMI 50.59 kg/m   Physical Exam  GEN: Well nourished, well developed, in no acute distress  Neck: no JVD, carotid bruits, or masses Cardiac:RRR; distant heart sounds no murmurs, rubs, or gallops  Respiratory:  clear to auscultation bilaterally, normal work of breathing GI: soft, nontender, nondistended, + BS Ext: without cyanosis, clubbing, or edema, Good distal pulses bilaterally Neuro:  Alert and Oriented x 3 Psych: euthymic mood, full affect  Wt Readings from  Last 3 Encounters:  11/07/17 (!) 304 lb (137.9 kg)  10/19/17 (!) 308 lb (139.7 kg)  09/26/17 (!) 311 lb (141.1 kg)      Studies/Labs Reviewed:   EKG:  EKG is not  ordered today.    Recent Labs: 09/05/2017: ALT 34; B Natriuretic Peptide 386.0; TSH 14.361 09/13/2017: Hemoglobin 9.6; Platelets 184 10/30/2017: BUN 25; Creatinine, Ser 0.90; Magnesium 1.8; Potassium 4.1; Sodium 139   Lipid Panel    Component Value Date/Time   CHOL 124 09/09/2017 0239   TRIG 177 (H) 09/09/2017 0239   HDL 32 (L) 09/09/2017 0239   CHOLHDL 3.9 09/09/2017 0239   VLDL 35 09/09/2017 0239   LDLCALC 57  09/09/2017 0239    Additional studies/ records that were reviewed today include:   09/2017 diagnostic cath  Prox RCA lesion, 30 %stenosed.  Prox RCA to Mid RCA lesion, 70 %stenosed.  Mid RCA lesion, 90 %stenosed.  RPDA lesion, 80 %stenosed.  Prox Cx lesion, 20 %stenosed.  Ost 1st Mrg lesion, 80 %stenosed.  1st Mrg-2 lesion, 80 %stenosed.  1st Mrg-1 lesion, 90 %stenosed.  Mid Cx-2 lesion, 50 %stenosed.  Mid Cx-1 lesion, 60 %stenosed.  Prox LAD lesion, 60 %stenosed.  Mid LAD lesion, 85 %stenosed.   Severe multivessel CAD with 60% proximal LAD stenosis and 85% mid LAD stenosis; 80, 90 and 80%  OM1 stenosis of the left circumflex with 60% mid AV groove and 50% mid distal AV groove stenosis; dominant RCA with 30% proximal followed by 70% proximal stenosis, 90% mid stenosis, and diffuse mid distal PDA stenosis of at least 80%.   LVEDP 33 mmHg;  EF by previous echo 30-35%; left ventriculography not performed.   Moderate right heart pressure elevation with mild/moderate pulmonary hypertension.   RECOMMENDATION: In this diabetic female with significant multivessel CAD, recommend surgical consultation for consideration for CABG revascularization.   09/2017 PCI  LESION #1  Prox LAD lesion, 60 %stenosed. Prox LAD to Mid LAD lesion, 65 %stenosed. Mid LAD lesion, 85 %stenosed. Tandem Lesions.  A  STENT RESOLUTE ONYX [id number] drug eluting stent was successfully placed. Post-dilated in tapered fashion: 3.6 - 2.9 mm  Post intervention, there is a 0% residual stenosis.  LESION $2  Prox RCA lesion, 45 %stenosed. Prox RCA to Mid RCA lesion, 70 %stenosed. Mid RCA lesion, 90 %stenosed. Tandem Lesions  A STENT RESOLUTE ONYX [id number] drug eluting stent and STENT RESOLUTE ONYX 2.75X26 drug-eluting stent were successfully placed in overlapping fashion.  Post intervention, there is a 0% residual stenosis. Post-dilated in tapered fashion: 3.6 - 3.1 mm  RPDA lesion, 80 %stenosed. Plan Medical management.   Successful 2 vessel PCI with stable circumflex disease. Plan is to treat circumflex flex disease medically for now.     Plan: Return to nursing unit for post PCI care TR band removal. Continue to optimize medical management, but from a post Standpoint she will be able to be discharged tomorrow. Continue aspirin plus Plavix for minimum one year, but would continue Plavix lifelong         08/2017 echo Study Conclusions   - Procedure narrative: Transthoracic echocardiography. Image   quality was fair. The study was technically difficult, as a   result of poor sound wave transmission. Intravenous contrast   (Definity) was administered. - Left ventricle: The cavity size was mildly dilated. Systolic   function was moderately to severely reduced. The estimated   ejection fraction was in the range of 30% to 35%. Diffuse   hypokinesis. Doppler parameters are consistent with abnormal left   ventricular relaxation (grade 1 diastolic dysfunction). Mild   concentric and moderate focal basal septal hypertrophy. - Ventricular septum: Septal motion showed abnormal function and   dyssynergy. These changes are consistent with a left bundle   branch block. - Aortic valve: Mildly calcified annulus. - Systemic veins: IVC is dilated with normal respiratory variation.   It was suboptimally visualized.  Estimated right atrial pressure:   8 mmHg. - Pericardium, extracardiac: Moderate size pericardial effusion.   There is some right atrial inversion noted, but no frank   tamponade physiology.     09/2017 diagnostic cath  Prox RCA lesion, 30 %stenosed.  Prox RCA to Mid RCA  lesion, 70 %stenosed.  Mid RCA lesion, 90 %stenosed.  RPDA lesion, 80 %stenosed.  Prox Cx lesion, 20 %stenosed.  Ost 1st Mrg lesion, 80 %stenosed.  1st Mrg-2 lesion, 80 %stenosed.  1st Mrg-1 lesion, 90 %stenosed.  Mid Cx-2 lesion, 50 %stenosed.  Mid Cx-1 lesion, 60 %stenosed.  Prox LAD lesion, 60 %stenosed.  Mid LAD lesion, 85 %stenosed.   Severe multivessel CAD with 60% proximal LAD stenosis and 85% mid LAD stenosis; 80, 90 and 80%  OM1 stenosis of the left circumflex with 60% mid AV groove and 50% mid distal AV groove stenosis; dominant RCA with 30% proximal followed by 70% proximal stenosis, 90% mid stenosis, and diffuse mid distal PDA stenosis of at least 80%.   LVEDP 33 mmHg;  EF by previous echo 30-35%; left ventriculography not performed.   Moderate right heart pressure elevation with mild/moderate pulmonary hypertension.   RECOMMENDATION: In this diabetic female with significant multivessel CAD, recommend surgical consultation for consideration for CABG revascularization.   09/2017 PCI  LESION #1  Prox LAD lesion, 60 %stenosed. Prox LAD to Mid LAD lesion, 65 %stenosed. Mid LAD lesion, 85 %stenosed. Tandem Lesions.  A STENT RESOLUTE ONYX [id number] drug eluting stent was successfully placed. Post-dilated in tapered fashion: 3.6 - 2.9 mm  Post intervention, there is a 0% residual stenosis.  LESION $2  Prox RCA lesion, 45 %stenosed. Prox RCA to Mid RCA lesion, 70 %stenosed. Mid RCA lesion, 90 %stenosed. Tandem Lesions  A STENT RESOLUTE ONYX [id number] drug eluting stent and STENT RESOLUTE ONYX 2.75X26 drug-eluting stent were successfully placed in overlapping fashion.  Post intervention, there  is a 0% residual stenosis. Post-dilated in tapered fashion: 3.6 - 3.1 mm  RPDA lesion, 80 %stenosed. Plan Medical management.   Successful 2 vessel PCI with stable circumflex disease. Plan is to treat circumflex flex disease medically for now.     Plan: Return to nursing unit for post PCI care TR band removal. Continue to optimize medical management, but from a post Standpoint she will be able to be discharged tomorrow. Continue aspirin plus Plavix for minimum one year, but would continue Plavix lifelong         08/2017 echo Study Conclusions   - Procedure narrative: Transthoracic echocardiography. Image   quality was fair. The study was technically difficult, as a   result of poor sound wave transmission. Intravenous contrast   (Definity) was administered. - Left ventricle: The cavity size was mildly dilated. Systolic   function was moderately to severely reduced. The estimated   ejection fraction was in the range of 30% to 35%. Diffuse   hypokinesis. Doppler parameters are consistent with abnormal left   ventricular relaxation (grade 1 diastolic dysfunction). Mild   concentric and moderate focal basal septal hypertrophy. - Ventricular septum: Septal motion showed abnormal function and   dyssynergy. These changes are consistent with a left bundle   branch block. - Aortic valve: Mildly calcified annulus. - Systemic veins: IVC is dilated with normal respiratory variation.   It was suboptimally visualized. Estimated right atrial pressure:   8 mmHg. - Pericardium, extracardiac: Moderate size pericardial effusion.   There is some right atrial inversion noted, but no frank   tamponade physiology.        ASSESSMENT:    1. CAD, multiple vessel   2. Chronic combined systolic and diastolic CHF (congestive heart failure) ([place])   3. Essential hypertension   4. Ischemic cardiomyopathy   5. Pericardial effusion      PLAN:  In order of problems listed above:  CAD status post  DES to the LAD and RCA x2 with residual circumflex disease treated medically.  Continue Plavix lifelong.  No further chest pain.  Chronic combined systolic and diastolic CHF patient's carvedilol was increased by Dr. Harl Bowie but she misunderstood how to take it so we will increase to 25 mg twice daily.  I will see her back next week to see if we can add Aldactone.  She did not take her medications prior to coming here and her blood pressure is quite high.  She says is been running about 130/80 at home.  Heart failure compensated today.  Essential hypertension BP high but she has not taken her medications yet today.  Ischemic cardiomyopathy ejection fraction 30-35% on echo 08/2017.  Renal function normal at 0.90 10/30/17. Dr. Harl Bowie recommends adding Aldactone and consider changing to [facility] in the future.  I am increasing her carvedilol to 25 mg twice daily today.  I will see her back next week to see if we can add Aldactone.    Medication Adjustments/Labs and Tests Ordered: Current medicines are reviewed at length with the patient today.  Concerns regarding medicines are outlined above.  Medication changes, Labs and Tests ordered today are listed in the Patient Instructions below. There are no Patient Instructions on file for this visit.   Sumner Boast, PA-C  11/07/2017 11:58 AM    [facility] [address], [place], [place]  [address] Phone: [phone]; Fax: [phone]

## 2017-11-07 ENCOUNTER — Ambulatory Visit (INDEPENDENT_AMBULATORY_CARE_PROVIDER_SITE_OTHER): Payer: Medicare HMO | Admitting: Physician Assistant

## 2017-11-07 ENCOUNTER — Other Ambulatory Visit: Payer: Self-pay

## 2017-11-07 ENCOUNTER — Encounter: Payer: Self-pay | Admitting: Physician Assistant

## 2017-11-07 VITALS — BP 196/104 | HR 82 | Ht 65.0 in | Wt 304.0 lb

## 2017-11-07 DIAGNOSIS — I255 Ischemic cardiomyopathy: Secondary | ICD-10-CM

## 2017-11-07 DIAGNOSIS — I5042 Chronic combined systolic (congestive) and diastolic (congestive) heart failure: Secondary | ICD-10-CM | POA: Diagnosis not present

## 2017-11-07 DIAGNOSIS — I3139 Other pericardial effusion (noninflammatory): Secondary | ICD-10-CM

## 2017-11-07 DIAGNOSIS — I10 Essential (primary) hypertension: Secondary | ICD-10-CM

## 2017-11-07 DIAGNOSIS — I2510 Atherosclerotic heart disease of native coronary artery without angina pectoris: Secondary | ICD-10-CM

## 2017-11-07 DIAGNOSIS — I313 Pericardial effusion (noninflammatory): Secondary | ICD-10-CM

## 2017-11-07 MED ORDER — CARVEDILOL 25 MG PO TABS: 25.0000 mg | ORAL_TABLET | Freq: Two times a day (BID) | ORAL | 3 refills | Status: DC

## 2017-11-07 MED ORDER — ISOSORBIDE MONONITRATE ER 30 MG PO TB24: 30.0000 mg | ORAL_TABLET | Freq: Every day | ORAL | 3 refills | Status: DC

## 2017-11-07 MED ORDER — CLOPIDOGREL BISULFATE 75 MG PO TABS: 75.0000 mg | ORAL_TABLET | Freq: Every day | ORAL | 3 refills | Status: DC

## 2017-11-07 MED ORDER — AMLODIPINE BESYLATE 5 MG PO TABS: 5.0000 mg | ORAL_TABLET | Freq: Every day | ORAL | 3 refills | Status: DC

## 2017-11-07 MED ORDER — LOSARTAN POTASSIUM 100 MG PO TABS: 100.0000 mg | ORAL_TABLET | Freq: Every day | ORAL | 3 refills | Status: DC

## 2017-11-07 NOTE — Telephone Encounter (Signed)
Called pharmacy. They had faxed the approval to the wrong physician office. I sent new RX for increased dose.

## 2017-11-07 NOTE — Patient Instructions (Signed)
Medication Instructions:  Your physician recommends that you continue on your current medications as directed. Please refer to the Current Medication list given to you today.   Labwork: NONE   Testing/Procedures: NONE   Follow-Up: Your physician recommends that you schedule a follow-up appointment in: 1 Week    Any Other Special Instructions Will Be Listed Below (If Applicable).     If you need a refill on your cardiac medications before your next appointment, please call your pharmacy. Thank you for choosing [facility]!

## 2017-11-08 DIAGNOSIS — R69 Illness, unspecified: Secondary | ICD-10-CM | POA: Diagnosis not present

## 2017-11-09 ENCOUNTER — Telehealth: Payer: Self-pay | Admitting: Physician Assistant

## 2017-11-09 NOTE — Telephone Encounter (Signed)
Patient states that since medication change at [facility] on 11/28 she has been feeling tired and dizzy. / tg

## 2017-11-12 NOTE — Telephone Encounter (Signed)
[id number] 

## 2017-11-13 NOTE — Telephone Encounter (Signed)
Pt states that she is no longer feeling dizzy. She has now adjusted to the [place]. She reports BP yesterday of 153/79. Patient confirmed OV tomorrow.

## 2017-11-13 NOTE — Telephone Encounter (Signed)
[id number] 

## 2017-11-14 ENCOUNTER — Other Ambulatory Visit (HOSPITAL_COMMUNITY)
Admission: RE | Admit: 2017-11-14 | Discharge: 2017-11-14 | Disposition: A | Discharge status: Home / Self Care | Payer: Medicare HMO | Source: Ambulatory Visit | Attending: Physician Assistant | Admitting: Physician Assistant

## 2017-11-14 ENCOUNTER — Ambulatory Visit (INDEPENDENT_AMBULATORY_CARE_PROVIDER_SITE_OTHER): Payer: Medicare HMO | Admitting: Physician Assistant

## 2017-11-14 ENCOUNTER — Encounter: Payer: Self-pay | Admitting: Physician Assistant

## 2017-11-14 VITALS — BP 126/80 | HR 76 | Ht 65.0 in | Wt 306.6 lb

## 2017-11-14 DIAGNOSIS — I10 Essential (primary) hypertension: Secondary | ICD-10-CM | POA: Diagnosis not present

## 2017-11-14 DIAGNOSIS — I5042 Chronic combined systolic (congestive) and diastolic (congestive) heart failure: Secondary | ICD-10-CM | POA: Insufficient documentation

## 2017-11-14 DIAGNOSIS — I255 Ischemic cardiomyopathy: Secondary | ICD-10-CM

## 2017-11-14 DIAGNOSIS — I2510 Atherosclerotic heart disease of native coronary artery without angina pectoris: Secondary | ICD-10-CM | POA: Diagnosis not present

## 2017-11-14 LAB — BASIC METABOLIC PANEL
Anion gap: 8 (ref 5–15)
BUN: 33 mg/dL — AB (ref 6–20)
CALCIUM: 8.9 mg/dL (ref 8.9–10.3)
CHLORIDE: 102 mmol/L (ref 101–111)
CO2: 28 mmol/L (ref 22–32)
Creatinine, Ser: 1.17 mg/dL — ABNORMAL HIGH (ref 0.44–1.00)
GFR calc Af Amer: 58 mL/min — ABNORMAL LOW (ref >60)
GFR, EST NON AFRICAN AMERICAN: 50 mL/min — AB (ref >60)
GLUCOSE: 154 mg/dL — AB (ref 65–99)
POTASSIUM: 4.2 mmol/L (ref 3.5–5.1)
SODIUM: 138 mmol/L (ref 135–145)

## 2017-11-14 MED ORDER — ATORVASTATIN CALCIUM 80 MG PO TABS: 80.0000 mg | ORAL_TABLET | Freq: Every day | ORAL | 3 refills | Status: DC

## 2017-11-14 NOTE — Progress Notes (Signed)
Cardiology Office Note    Date:  11/14/2017   ID:  Maureen Jackson, DOB [birth year], MRN [id number]  PCP:  Neale Burly, MD  Cardiologist: Dr. Harl Bowie  No chief complaint on file.   History of Present Illness:  Maureen Jackson is a 61 y.o. female with history of CAD status post DES to the LAD and DES to the RCA x2, circumflex disease managed medically, lifelong Plavix recommended on cath 09/2017, ischemic cardiomyopathy ejection fraction 30-35% with grade 1 DD echo 08/2017, moderate pericardial effusion on echo 09/2017 stable by serial imaging without evidence of tamponade.  Severe hypothyroidism probably the cause.  TSH was 14 being replaced by PCP.  Last saw Dr. Harl Bowie 10/19/17 who increased his Coreg to 25 mg twice daily and recommended adding Aldactone next visit pending renal function and potassium and blood pressures.  In future consider Entresto.   I saw the patient 11/07/17 and she had not taken any medications that day.  Her blood pressure was elevated.  She was taking carvedilol 12.5 mg twice daily rather than 25 mg twice daily.  She had no chest pain or shortness of breath.  I increased her carvedilol to 25 mg twice daily.  Patient comes in today for follow-up.  She has taken all her medications today.  Her blood pressure is much better today.  Her cuff at home does not calibrate with ours and is reading much higher.  She bought it off of [employer]'s clearing house.  She is not taking atorvastatin but is still on her Zocor 10 mg daily.  She said she was unaware of this change.  She denies any chest pain or shortness of breath.  She is trying to work hard on doing what we have asked her to do.    Past Medical History:  Diagnosis Date  . 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 Coronary artery disease   . Diabetes mellitus   . Dyspnea   . Glaucoma   . Hypertension   . Morbid obesity ([place])   . Obstructive sleep apnea    does not wear CPAP  . Thyroid disease     Past Surgical  History:  Procedure Laterality Date  . ABDOMINAL HYSTERECTOMY    . CARDIAC CATHETERIZATION  09/12/2017  . CORONARY STENT INTERVENTION  09/12/2017   STENT RESOLUTE ONYX 6.96E95 drug eluting stent was successfully placed  . CORONARY STENT INTERVENTION N/A 09/12/2017   Procedure: CORONARY STENT INTERVENTION;  Surgeon: Leonie Man, MD;  Location: [facility];  Service: Cardiovascular;  Laterality: N/A;  . LEFT HEART CATH AND CORONARY ANGIOGRAPHY N/A 09/12/2017   Procedure: LEFT HEART CATH AND CORONARY ANGIOGRAPHY;  Surgeon: Leonie Man, MD;  Location: [facility];  Service: Cardiovascular;  Laterality: N/A;  . RIGHT/LEFT HEART CATH AND CORONARY ANGIOGRAPHY N/A 09/10/2017   Procedure: RIGHT/LEFT HEART CATH AND CORONARY ANGIOGRAPHY;  Surgeon: Troy Sine, MD;  Location: [facility];  Service: Cardiovascular;  Laterality: N/A;  . THYROID SURGERY      Current Medications: Current Meds  Medication Sig  . amLODipine (NORVASC) 5 MG tablet Take 1 tablet (5 mg total) by mouth daily.  Marland Kitchen aspirin 81 MG chewable tablet Chew 1 tablet (81 mg total) by mouth daily.  . carvedilol (COREG) 25 MG tablet Take 1 tablet (25 mg total) by mouth 2 (two) times daily.  . clopidogrel (PLAVIX) 75 MG tablet Take 1 tablet (75 mg total) by mouth  daily with breakfast.  . furosemide (LASIX) 40 MG tablet Take 1 tablet (40 mg total) by mouth daily.  Marland Kitchen glimepiride (AMARYL) 2 MG tablet Take 1 tablet by mouth daily at 12 noon.  . isosorbide mononitrate (IMDUR) 30 MG 24 hr tablet Take 1 tablet (30 mg total) by mouth daily.  Marland Kitchen levothyroxine (SYNTHROID, LEVOTHROID) 200 MCG tablet TK 1 T PO QD  . levothyroxine (SYNTHROID, LEVOTHROID) 50 MCG tablet TK 1 T PO D WITH 200MCG TABLET FOR A TOTAL DAILY DOSE OF 250MCG  . losartan (COZAAR) 100 MG tablet Take 1 tablet (100 mg total) by mouth daily.  . metFORMIN (GLUCOPHAGE) 1000 MG tablet Take 1 tablet (1,000 mg total) by mouth 2 (two) times daily with a meal.  .  simvastatin (ZOCOR) 10 MG tablet Take 1 tablet by mouth daily.  . VENTOLIN HFA 108 (90 Base) MCG/ACT inhaler Inhale 1-2 puffs into the lungs every 6 (six) hours as needed for wheezing or shortness of breath.     Allergies:   Aspirin   Social History   Socioeconomic History  . Marital status: Widowed    Spouse name: None  . Number of children: None  . Years of education: None  . Highest education level: None  Social Needs  . Financial resource strain: None  . Food insecurity - worry: None  . Food insecurity - inability: None  . Transportation needs - medical: None  . Transportation needs - non-medical: None  Occupational History  . 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 Smoking status: Former Research scientist (life sciences)  . Smokeless tobacco: Never Used  . Tobacco comment: QUIT IN 2005  Substance and Sexual Activity  . Alcohol use: No  . Drug use: No  . Sexual activity: No  Other Topics Concern  . None  Social History Narrative  . None     Family History:  The patient's family history includes Cancer in her mother; Diabetes in her mother; Hypertension in her mother and sister.   ROS:   Please see the history of present illness.    Review of Systems  Constitution: Negative.  HENT: Negative.   Eyes: Negative.   Cardiovascular: Negative.   Respiratory: Negative.   Hematologic/Lymphatic: Negative.   Musculoskeletal: Negative.  Negative for joint pain.  Gastrointestinal: Negative.   Genitourinary: Negative.   Neurological: Negative.    All other systems reviewed and are negative.   PHYSICAL EXAM:   VS:  BP 126/80   Pulse 76   Ht 5\' 5"  (1.651 m)   Wt (!) 306 lb 9.6 oz (139.1 kg)   SpO2 95% Comment: on room air  BMI 51.02 kg/m   Physical Exam  GEN: Obese, in no acute distress  Neck: no JVD, carotid bruits, or masses Cardiac:RRR; no murmurs, rubs, or gallops  Respiratory:  clear to auscultation bilaterally, normal work of breathing GI: soft,  nontender, nondistended, + BS Ext: without cyanosis, clubbing, or edema, Good distal pulses bilaterally Neuro:  Alert and Oriented x 3 Psych: euthymic mood, full affect  Wt Readings from Last 3 Encounters:  11/14/17 (!) 306 lb 9.6 oz (139.1 kg)  11/07/17 (!) 304 lb (137.9 kg)  10/19/17 (!) 308 lb (139.7 kg)      Studies/Labs Reviewed:   EKG:  EKG is not ordered today.     Recent Labs: 09/05/2017: ALT 34; B Natriuretic Peptide 386.0; TSH 14.361 09/13/2017: Hemoglobin 9.6; Platelets 184 10/30/2017: BUN 25; Creatinine, Ser 0.90; Magnesium 1.8; Potassium 4.1;  Sodium 139   Lipid Panel    Component Value Date/Time   CHOL 124 09/09/2017 0239   TRIG 177 (H) 09/09/2017 0239   HDL 32 (L) 09/09/2017 0239   CHOLHDL 3.9 09/09/2017 0239   VLDL 35 09/09/2017 0239   LDLCALC 57 09/09/2017 0239    Additional studies/ records that were reviewed today include:  09/2017 diagnostic cath  Prox RCA lesion, 30 %stenosed.  Prox RCA to Mid RCA lesion, 70 %stenosed.  Mid RCA lesion, 90 %stenosed.  RPDA lesion, 80 %stenosed.  Prox Cx lesion, 20 %stenosed.  Ost 1st Mrg lesion, 80 %stenosed.  1st Mrg-2 lesion, 80 %stenosed.  1st Mrg-1 lesion, 90 %stenosed.  Mid Cx-2 lesion, 50 %stenosed.  Mid Cx-1 lesion, 60 %stenosed.  Prox LAD lesion, 60 %stenosed.  Mid LAD lesion, 85 %stenosed.   Severe multivessel CAD with 60% proximal LAD stenosis and 85% mid LAD stenosis; 80, 90 and 80%  OM1 stenosis of the left circumflex with 60% mid AV groove and 50% mid distal AV groove stenosis; dominant RCA with 30% proximal followed by 70% proximal stenosis, 90% mid stenosis, and diffuse mid distal PDA stenosis of at least 80%.   LVEDP 33 mmHg;  EF by previous echo 30-35%; left ventriculography not performed.   Moderate right heart pressure elevation with mild/moderate pulmonary hypertension.   RECOMMENDATION: In this diabetic female with significant multivessel CAD, recommend surgical consultation for  consideration for CABG revascularization.   09/2017 PCI  LESION #1  Prox LAD lesion, 60 %stenosed. Prox LAD to Mid LAD lesion, 65 %stenosed. Mid LAD lesion, 85 %stenosed. Tandem Lesions.  A STENT RESOLUTE ONYX [id number] drug eluting stent was successfully placed. Post-dilated in tapered fashion: 3.6 - 2.9 mm  Post intervention, there is a 0% residual stenosis.  LESION $2  Prox RCA lesion, 45 %stenosed. Prox RCA to Mid RCA lesion, 70 %stenosed. Mid RCA lesion, 90 %stenosed. Tandem Lesions  A STENT RESOLUTE ONYX [id number] drug eluting stent and STENT RESOLUTE ONYX 2.75X26 drug-eluting stent were successfully placed in overlapping fashion.  Post intervention, there is a 0% residual stenosis. Post-dilated in tapered fashion: 3.6 - 3.1 mm  RPDA lesion, 80 %stenosed. Plan Medical management.   Successful 2 vessel PCI with stable circumflex disease. Plan is to treat circumflex flex disease medically for now.     Plan: Return to nursing unit for post PCI care TR band removal. Continue to optimize medical management, but from a post Standpoint she will be able to be discharged tomorrow. Continue aspirin plus Plavix for minimum one year, but would continue Plavix lifelong         08/2017 echo Study Conclusions   - Procedure narrative: Transthoracic echocardiography. Image   quality was fair. The study was technically difficult, as a   result of poor sound wave transmission. Intravenous contrast   (Definity) was administered. - Left ventricle: The cavity size was mildly dilated. Systolic   function was moderately to severely reduced. The estimated   ejection fraction was in the range of 30% to 35%. Diffuse   hypokinesis. Doppler parameters are consistent with abnormal left   ventricular relaxation (grade 1 diastolic dysfunction). Mild   concentric and moderate focal basal septal hypertrophy. - Ventricular septum: Septal motion showed abnormal function and   dyssynergy. These changes  are consistent with a left bundle   branch block. - Aortic valve: Mildly calcified annulus. - Systemic veins: IVC is dilated with normal respiratory variation.   It was suboptimally visualized. Estimated right atrial  pressure:   8 mmHg. - Pericardium, extracardiac: Moderate size pericardial effusion.   There is some right atrial inversion noted, but no frank   tamponade physiology.         ASSESSMENT:    1. Chronic combined systolic and diastolic CHF (congestive heart failure) ([place])   2. Ischemic cardiomyopathy   3. Essential hypertension   4. CAD, multiple vessel      PLAN:  In order of problems listed above:  Chronic combined systolic and diastolic CHF.  I increased her carvedilol to 25 mg twice daily last week.  Dr. Harl Bowie want to add Aldactone and hopefully Entresto in the future.  Patient's blood pressure is much better and she is taken most of her medicines properly except for atorvastatin.  We will check renal function today and if it stable we will add Aldactone 25 mg once daily.  Follow-up with Dr. Harl Bowie February 1.  Ischemic cardiomyopathy ejection fraction 30-35% on echo 08/2017 well compensated today without heart failure on exam.  Essential hypertension blood pressure much better controlled.  Her cuff that she brought in does not coincide with what we get.  Her cuff is reading much higher.  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  I advised her to try to obtain a new cuff from the pharmacy.  Hopefully insurance will pay for this.  CADstatus post DES to the LAD and DES to the RCA x2, circumflex disease managed medically, lifelong Plavix recommended on cath 09/2017, no angina.  Hyperlipidemia start atorvastatin 80 mg once daily in the Zocor will need LFTs and lipid profile in 6 weeks.   Medication Adjustments/Labs and Tests Ordered: Current medicines are reviewed at length with the patient today.  Concerns regarding medicines are outlined above.   Medication changes, Labs and Tests ordered today are listed in the Patient Instructions below. There are no Patient Instructions on file for this visit.   Signed, Ermalinda Barrios, PA-C  11/14/2017 3:02 PM    [facility] [place], [place],   [address] Phone: [phone]; Fax: [phone]

## 2017-11-14 NOTE — Patient Instructions (Signed)
Medication Instructions:  Your physician recommends that you continue on your current medications as directed. Please refer to the Current Medication list given to you today.  Stop taking Simvastatin   Labwork: Your physician recommends that you return for lab work in: Today    Testing/Procedures: NONE  Follow-Up: Your physician recommends that you schedule a follow-up appointment in: February with Dr. Harl Bowie    Any Other Special Instructions Will Be Listed Below (If Applicable).     If you need a refill on your cardiac medications before your next appointment, please call your pharmacy. Thank you for choosing [facility]!

## 2017-11-21 ENCOUNTER — Telehealth: Payer: Self-pay | Admitting: Physician Assistant

## 2017-11-21 NOTE — Telephone Encounter (Signed)
Please call patient with medication instruction clarification. / tg

## 2017-11-21 NOTE — Telephone Encounter (Signed)
Returned pt call. She was confused as to what medications were changed at [facility], I clarified it with her and she voiced understanding.

## 2017-12-24 DIAGNOSIS — R69 Illness, unspecified: Secondary | ICD-10-CM | POA: Diagnosis not present

## 2018-01-05 DIAGNOSIS — R69 Illness, unspecified: Secondary | ICD-10-CM | POA: Diagnosis not present

## 2018-01-15 ENCOUNTER — Ambulatory Visit: Payer: Medicare HMO | Admitting: Cardiology

## 2018-02-03 DIAGNOSIS — Z87891 Personal history of nicotine dependence: Secondary | ICD-10-CM | POA: Diagnosis not present

## 2018-02-03 DIAGNOSIS — M1990 Unspecified osteoarthritis, unspecified site: Secondary | ICD-10-CM | POA: Diagnosis not present

## 2018-02-03 DIAGNOSIS — I10 Essential (primary) hypertension: Secondary | ICD-10-CM | POA: Diagnosis not present

## 2018-02-03 DIAGNOSIS — L299 Pruritus, unspecified: Secondary | ICD-10-CM | POA: Diagnosis not present

## 2018-02-03 DIAGNOSIS — E119 Type 2 diabetes mellitus without complications: Secondary | ICD-10-CM | POA: Diagnosis not present

## 2018-02-03 DIAGNOSIS — R0981 Nasal congestion: Secondary | ICD-10-CM | POA: Diagnosis not present

## 2018-02-03 DIAGNOSIS — R61 Generalized hyperhidrosis: Secondary | ICD-10-CM | POA: Diagnosis not present

## 2018-02-11 ENCOUNTER — Ambulatory Visit (INDEPENDENT_AMBULATORY_CARE_PROVIDER_SITE_OTHER): Payer: Medicare HMO | Admitting: Cardiology

## 2018-02-11 ENCOUNTER — Encounter: Payer: Self-pay | Admitting: Cardiology

## 2018-02-11 VITALS — BP 164/84 | HR 91 | Ht 66.0 in | Wt 302.0 lb

## 2018-02-11 DIAGNOSIS — I313 Pericardial effusion (noninflammatory): Secondary | ICD-10-CM

## 2018-02-11 DIAGNOSIS — I10 Essential (primary) hypertension: Secondary | ICD-10-CM

## 2018-02-11 DIAGNOSIS — I2510 Atherosclerotic heart disease of native coronary artery without angina pectoris: Secondary | ICD-10-CM | POA: Diagnosis not present

## 2018-02-11 DIAGNOSIS — I5022 Chronic systolic (congestive) heart failure: Secondary | ICD-10-CM

## 2018-02-11 DIAGNOSIS — I3139 Other pericardial effusion (noninflammatory): Secondary | ICD-10-CM

## 2018-02-11 MED ORDER — SACUBITRIL-VALSARTAN 49-51 MG PO TABS: 1.0000 | ORAL_TABLET | Freq: Two times a day (BID) | ORAL | 3 refills | Status: DC

## 2018-02-11 NOTE — Progress Notes (Signed)
Clinical Summary Maureen Jackson is a 62 y.o.female seen today for follow up of the following medical problems.   1. Chronic systolic HF/ICM/CAD - patient admitted with acute onset CHF 08/2017.  - echo 08/2017 LVEF 30-35%, grade I diastoilc dysfunction - 09/2017 cath as reported below, found to have severe multivessel disease of LAD, LCX, and RCA. Mean PA 33, PCWP 25, CI 2.7 - seen by CT surgery, recs were for PCI as opposed to CABG. Received DES to LAD and DES x 2 to RCA, LCX disease managed medically. Recs for lifelong plavix per interventional cards - discharge weight 09/13/17 312 lbs   - no recent SOB/DOE. No recent edema. Nop recent chest pain.  - compliant with meds    2. Hypothyroidism - TSH 14 during recent admission - replacement therapy per pcp   3. Pericardial effusion - 09/2017 echo moderate pericardial effusion, stable by serial imaging without evidence of tamponade - patient with severe hypothyroidism, probable cause.   - no recent symptoms   Past Medical History:  Diagnosis Date  . 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 Coronary artery disease   . Diabetes mellitus   . Dyspnea   . Glaucoma   . Hypertension   . Morbid obesity ([place])   . Obstructive sleep apnea    does not wear CPAP  . Thyroid disease      Allergies  Allergen Reactions  . Aspirin Nausea Only     Current Outpatient Medications  Medication Sig Dispense Refill  . amLODipine (NORVASC) 5 MG tablet Take 1 tablet (5 mg total) by mouth daily. 90 tablet 3  . aspirin 81 MG chewable tablet Chew 1 tablet (81 mg total) by mouth daily. 60 tablet 0  . atorvastatin (LIPITOR) 80 MG tablet Take 1 tablet (80 mg total) by mouth daily at 6 PM. 90 tablet 3  . carvedilol (COREG) 25 MG tablet Take 1 tablet (25 mg total) by mouth 2 (two) times daily. 180 tablet 3  . clopidogrel (PLAVIX) 75 MG tablet Take 1 tablet (75 mg total) by mouth daily with breakfast. 90 tablet 3  . furosemide (LASIX) 40 MG tablet Take  1 tablet (40 mg total) by mouth daily. 30 tablet 0  . glimepiride (AMARYL) 2 MG tablet Take 1 tablet by mouth daily at 12 noon.    . isosorbide mononitrate (IMDUR) 30 MG 24 hr tablet Take 1 tablet (30 mg total) by mouth daily. 90 tablet 3  . levothyroxine (SYNTHROID, LEVOTHROID) 200 MCG tablet TK 1 T PO QD  0  . levothyroxine (SYNTHROID, LEVOTHROID) 50 MCG tablet TK 1 T PO D WITH 200MCG TABLET FOR A TOTAL DAILY DOSE OF 250MCG  0  . losartan (COZAAR) 100 MG tablet Take 1 tablet (100 mg total) by mouth daily. 90 tablet 3  . metFORMIN (GLUCOPHAGE) 1000 MG tablet Take 1 tablet (1,000 mg total) by mouth 2 (two) times daily with a meal. 60 tablet 0  . VENTOLIN HFA 108 (90 Base) MCG/ACT inhaler Inhale 1-2 puffs into the lungs every 6 (six) hours as needed for wheezing or shortness of breath. 1 Inhaler 0   No current facility-administered medications for this visit.      Past Surgical History:  Procedure Laterality Date  . ABDOMINAL HYSTERECTOMY    . CARDIAC CATHETERIZATION  09/12/2017  . CORONARY STENT INTERVENTION  09/12/2017   STENT RESOLUTE ONYX 0.48G89 drug eluting stent was successfully placed  . CORONARY STENT INTERVENTION N/A 09/12/2017  Procedure: CORONARY STENT INTERVENTION;  Surgeon: Leonie Man, MD;  Location: [facility];  Service: Cardiovascular;  Laterality: N/A;  . LEFT HEART CATH AND CORONARY ANGIOGRAPHY N/A 09/12/2017   Procedure: LEFT HEART CATH AND CORONARY ANGIOGRAPHY;  Surgeon: Leonie Man, MD;  Location: [facility];  Service: Cardiovascular;  Laterality: N/A;  . RIGHT/LEFT HEART CATH AND CORONARY ANGIOGRAPHY N/A 09/10/2017   Procedure: RIGHT/LEFT HEART CATH AND CORONARY ANGIOGRAPHY;  Surgeon: Troy Sine, MD;  Location: [facility];  Service: Cardiovascular;  Laterality: N/A;  . THYROID SURGERY       Allergies  Allergen Reactions  . Aspirin Nausea Only      Family History  Problem Relation Age of Onset  . Diabetes Mother   .  Hypertension Mother   . Cancer Mother        pancreas  . Hypertension Sister      Social History Ms. Boldin reports that she has quit smoking. she has never used smokeless tobacco. Ms. Shatz reports that she does not drink alcohol.   Review of Systems CONSTITUTIONAL: No weight loss, fever, chills, weakness or fatigue.  HEENT: Eyes: No visual loss, blurred vision, double vision or yellow sclerae.No hearing loss, sneezing, congestion, runny nose or sore throat.  SKIN: No rash or itching.  CARDIOVASCULAR: per hpi RESPIRATORY: No shortness of breath, cough or sputum.  GASTROINTESTINAL: No anorexia, nausea, vomiting or diarrhea. No abdominal pain or blood.  GENITOURINARY: No burning on urination, no polyuria NEUROLOGICAL: No headache, dizziness, syncope, paralysis, ataxia, numbness or tingling in the extremities. No change in bowel or bladder control.  MUSCULOSKELETAL: No muscle, back pain, joint pain or stiffness.  LYMPHATICS: No enlarged nodes. No history of splenectomy.  PSYCHIATRIC: No history of depression or anxiety.  ENDOCRINOLOGIC: No reports of sweating, cold or heat intolerance. No polyuria or polydipsia.  Marland Kitchen   Physical Examination Vitals:   02/11/18 1345  BP: (!) 164/84  Pulse: 91  SpO2: 97%   Vitals:   02/11/18 1345  Weight: (!) 302 lb (137 kg)  Height: 5\' 6"  (1.676 m)    Gen: resting comfortably, no acute distress HEENT: no scleral icterus, pupils equal round and reactive, no palptable cervical adenopathy,  CV: RRR, no mr/g ,no jvd Resp: Clear to auscultation bilaterally GI: abdomen is soft, non-tender, non-distended, normal bowel sounds, no hepatosplenomegaly MSK: extremities are warm, no edema.  Skin: warm, no rash Neuro:  no focal deficits Psych: appropriate affect   Diagnostic Studies 09/2017 diagnostic cath  Prox RCA lesion, 30 %stenosed.  Prox RCA to Mid RCA lesion, 70 %stenosed.  Mid RCA lesion, 90 %stenosed.  RPDA lesion, 80  %stenosed.  Prox Cx lesion, 20 %stenosed.  Ost 1st Mrg lesion, 80 %stenosed.  1st Mrg-2 lesion, 80 %stenosed.  1st Mrg-1 lesion, 90 %stenosed.  Mid Cx-2 lesion, 50 %stenosed.  Mid Cx-1 lesion, 60 %stenosed.  Prox LAD lesion, 60 %stenosed.  Mid LAD lesion, 85 %stenosed.  Severe multivessel CAD with 60% proximal LAD stenosis and 85% mid LAD stenosis; 80, 90 and 80% OM1 stenosis of the left circumflex with 60% mid AV groove and 50% mid distal AV groove stenosis; dominant RCA with 30% proximal followed by 70% proximal stenosis, 90% mid stenosis, and diffuse mid distal PDA stenosis of at least 80%.  LVEDP 33 mmHg; EF by previous echo 30-35%; left ventriculography not performed.  Moderate right heart pressure elevation with mild/moderate pulmonary hypertension.  RECOMMENDATION: In this diabetic female with significant multivessel  CAD, recommend surgical consultation for consideration for CABG revascularization.  09/2017 PCI  LESION #1  Prox LAD lesion, 60 %stenosed. Prox LAD to Mid LAD lesion, 65 %stenosed. Mid LAD lesion, 85 %stenosed. Tandem Lesions.  A STENT RESOLUTE ONYX [id number] drug eluting stent was successfully placed. Post-dilated in tapered fashion: 3.6 - 2.9 mm  Post intervention, there is a 0% residual stenosis.  LESION $2  Prox RCA lesion, 45 %stenosed. Prox RCA to Mid RCA lesion, 70 %stenosed. Mid RCA lesion, 90 %stenosed. Tandem Lesions  A STENT RESOLUTE ONYX [id number] drug eluting stent and STENT RESOLUTE ONYX 2.75X26 drug-eluting stent were successfully placed in overlapping fashion.  Post intervention, there is a 0% residual stenosis. Post-dilated in tapered fashion: 3.6 - 3.1 mm  RPDA lesion, 80 %stenosed. Plan Medical management.  Successful 2 vessel PCI with stable circumflex disease. Plan is to treat circumflex flex disease medically for now.   Plan: Return to nursing unit for post PCI care TR band removal. Continue to optimize medical  management, but from a post Standpoint she will be able to be discharged tomorrow. Continue aspirin plus Plavix for minimum one year, but would continue Plavix lifelong     08/2017 echo Study Conclusions  - Procedure narrative: Transthoracic echocardiography. Image quality was fair. The study was technically difficult, as a result of poor sound wave transmission. Intravenous contrast (Definity) was administered. - Left ventricle: The cavity size was mildly dilated. Systolic function was moderately to severely reduced. The estimated ejection fraction was in the range of 30% to 35%. Diffuse hypokinesis. Doppler parameters are consistent with abnormal left ventricular relaxation (grade 1 diastolic dysfunction). Mild concentric and moderate focal basal septal hypertrophy. - Ventricular septum: Septal motion showed abnormal function and dyssynergy. These changes are consistent with a left bundle Valkyrie Guardiola block. - Aortic valve: Mildly calcified annulus. - Systemic veins: IVC is dilated with normal respiratory variation. It was suboptimally visualized. Estimated right atrial pressure: 8 mmHg. - Pericardium, extracardiac: Moderate size pericardial effusion. There is some right atrial inversion noted, but no frank tamponade physiology.    Assessment and Plan   1. Chronic systolic HF - new diagnosis during recent 08/2017 admission - cath with multivessel disease, s/p stents to LAD and RCA.  - no recent symptmos. Change losartan to entresto  2. CAD - cath with multivessel disease, s/p stents to LAD and RCA.  - continue current meds including DAPT  3. Pericardial effusion - stable by serial imaging. Potentially related to her severe hypthyroidism - continue to monitor at this time  4. HTN - elevated in clinic, follow with change to rentersto.    F/u 2 weeks for nursing visit. Pending vitals increase entresto further. F/u with me 1  monthn    Arnoldo Lenis, M.D.

## 2018-02-11 NOTE — Patient Instructions (Addendum)
Medication Instructions:  STOP LOSARTAN  START ENTRESTO 49/51 - TWO TIMES DAILY   Labwork: NONE  Testing/Procedures: NONE  Follow-Up: Your physician recommends that you schedule a follow-up appointment in: [place] physician recommends that you schedule a follow-up appointment in: 1 MONTH     Any Other Special Instructions Will Be Listed Below (If Applicable). ENTRESTO 49/51 SAMPLES GIVEN LOT # [id number]  EXP MAY 2020    If you need a refill on your cardiac medications before your next appointment, please call your pharmacy.

## 2018-02-14 ENCOUNTER — Ambulatory Visit: Payer: Medicare HMO | Admitting: Cardiology

## 2018-02-18 ENCOUNTER — Encounter: Payer: Self-pay | Admitting: Cardiology

## 2018-02-25 ENCOUNTER — Ambulatory Visit: Payer: Medicare HMO

## 2018-03-11 ENCOUNTER — Telehealth: Payer: Self-pay | Admitting: Cardiovascular Disease

## 2018-03-12 DIAGNOSIS — I5022 Chronic systolic (congestive) heart failure: Secondary | ICD-10-CM | POA: Diagnosis not present

## 2018-03-12 DIAGNOSIS — H35033 Hypertensive retinopathy, bilateral: Secondary | ICD-10-CM | POA: Diagnosis not present

## 2018-03-12 DIAGNOSIS — Z6841 Body Mass Index (BMI) 40.0 and over, adult: Secondary | ICD-10-CM | POA: Diagnosis not present

## 2018-03-12 DIAGNOSIS — Z0000 Encounter for general adult medical examination without abnormal findings: Secondary | ICD-10-CM | POA: Diagnosis not present

## 2018-03-12 DIAGNOSIS — I10 Essential (primary) hypertension: Secondary | ICD-10-CM | POA: Diagnosis not present

## 2018-03-12 DIAGNOSIS — E1165 Type 2 diabetes mellitus with hyperglycemia: Secondary | ICD-10-CM | POA: Diagnosis not present

## 2018-03-12 DIAGNOSIS — E038 Other specified hypothyroidism: Secondary | ICD-10-CM | POA: Diagnosis not present

## 2018-03-14 ENCOUNTER — Emergency Department (HOSPITAL_COMMUNITY): Payer: Medicare HMO

## 2018-03-14 ENCOUNTER — Ambulatory Visit (INDEPENDENT_AMBULATORY_CARE_PROVIDER_SITE_OTHER): Payer: Medicare HMO | Admitting: Cardiology

## 2018-03-14 ENCOUNTER — Observation Stay (HOSPITAL_COMMUNITY)
Admission: EM | Admit: 2018-03-14 | Discharge: 2018-03-15 | Disposition: A | Discharge status: Home / Self Care | Payer: Medicare HMO | Attending: Internal Medicine | Admitting: Internal Medicine

## 2018-03-14 ENCOUNTER — Encounter: Payer: Self-pay | Admitting: Cardiology

## 2018-03-14 ENCOUNTER — Other Ambulatory Visit: Payer: Self-pay

## 2018-03-14 ENCOUNTER — Observation Stay (HOSPITAL_COMMUNITY): Payer: Medicare HMO

## 2018-03-14 ENCOUNTER — Encounter (HOSPITAL_COMMUNITY): Payer: Self-pay

## 2018-03-14 VITALS — BP 160/80 | HR 88 | Ht 67.0 in | Wt 305.0 lb

## 2018-03-14 DIAGNOSIS — I3139 Other pericardial effusion (noninflammatory): Secondary | ICD-10-CM

## 2018-03-14 DIAGNOSIS — G459 Transient cerebral ischemic attack, unspecified: Secondary | ICD-10-CM | POA: Insufficient documentation

## 2018-03-14 DIAGNOSIS — E119 Type 2 diabetes mellitus without complications: Secondary | ICD-10-CM | POA: Diagnosis not present

## 2018-03-14 DIAGNOSIS — I313 Pericardial effusion (noninflammatory): Secondary | ICD-10-CM

## 2018-03-14 DIAGNOSIS — I10 Essential (primary) hypertension: Secondary | ICD-10-CM

## 2018-03-14 DIAGNOSIS — R42 Dizziness and giddiness: Principal | ICD-10-CM

## 2018-03-14 DIAGNOSIS — Z79899 Other long term (current) drug therapy: Secondary | ICD-10-CM | POA: Insufficient documentation

## 2018-03-14 DIAGNOSIS — IMO0002 Reserved for concepts with insufficient information to code with codable children: Secondary | ICD-10-CM | POA: Diagnosis present

## 2018-03-14 DIAGNOSIS — Z87891 Personal history of nicotine dependence: Secondary | ICD-10-CM | POA: Insufficient documentation

## 2018-03-14 DIAGNOSIS — I2510 Atherosclerotic heart disease of native coronary artery without angina pectoris: Secondary | ICD-10-CM | POA: Diagnosis not present

## 2018-03-14 DIAGNOSIS — E039 Hypothyroidism, unspecified: Secondary | ICD-10-CM | POA: Diagnosis present

## 2018-03-14 DIAGNOSIS — I5022 Chronic systolic (congestive) heart failure: Secondary | ICD-10-CM

## 2018-03-14 DIAGNOSIS — G4730 Sleep apnea, unspecified: Secondary | ICD-10-CM | POA: Diagnosis present

## 2018-03-14 DIAGNOSIS — Z7984 Long term (current) use of oral hypoglycemic drugs: Secondary | ICD-10-CM | POA: Diagnosis not present

## 2018-03-14 DIAGNOSIS — I110 Hypertensive heart disease with heart failure: Secondary | ICD-10-CM | POA: Diagnosis not present

## 2018-03-14 DIAGNOSIS — Z7902 Long term (current) use of antithrombotics/antiplatelets: Secondary | ICD-10-CM | POA: Insufficient documentation

## 2018-03-14 DIAGNOSIS — R531 Weakness: Secondary | ICD-10-CM | POA: Diagnosis not present

## 2018-03-14 DIAGNOSIS — R200 Anesthesia of skin: Secondary | ICD-10-CM | POA: Diagnosis not present

## 2018-03-14 DIAGNOSIS — Z7982 Long term (current) use of aspirin: Secondary | ICD-10-CM | POA: Insufficient documentation

## 2018-03-14 DIAGNOSIS — E1165 Type 2 diabetes mellitus with hyperglycemia: Secondary | ICD-10-CM | POA: Diagnosis present

## 2018-03-14 DIAGNOSIS — I5042 Chronic combined systolic (congestive) and diastolic (congestive) heart failure: Secondary | ICD-10-CM | POA: Diagnosis not present

## 2018-03-14 DIAGNOSIS — E118 Type 2 diabetes mellitus with unspecified complications: Secondary | ICD-10-CM

## 2018-03-14 DIAGNOSIS — E1159 Type 2 diabetes mellitus with other circulatory complications: Secondary | ICD-10-CM | POA: Diagnosis present

## 2018-03-14 DIAGNOSIS — I6789 Other cerebrovascular disease: Secondary | ICD-10-CM | POA: Diagnosis not present

## 2018-03-14 DIAGNOSIS — R29818 Other symptoms and signs involving the nervous system: Secondary | ICD-10-CM | POA: Diagnosis not present

## 2018-03-14 LAB — CBC
HCT: 39 % (ref 36.0–46.0)
Hemoglobin: 12.6 g/dL (ref 12.0–15.0)
MCH: 25.8 pg — AB (ref 26.0–34.0)
MCHC: 32.3 g/dL (ref 30.0–36.0)
MCV: 79.8 fL (ref 78.0–100.0)
PLATELETS: 257 10*3/uL (ref 150–400)
RBC: 4.89 MIL/uL (ref 3.87–5.11)
RDW: 13.5 % (ref 11.5–15.5)
WBC: 7.7 10*3/uL (ref 4.0–10.5)

## 2018-03-14 LAB — COMPREHENSIVE METABOLIC PANEL
ALK PHOS: 215 U/L — AB (ref 38–126)
ALT: 33 U/L (ref 14–54)
AST: 33 U/L (ref 15–41)
Albumin: 3.3 g/dL — ABNORMAL LOW (ref 3.5–5.0)
Anion gap: 11 (ref 5–15)
BUN: 24 mg/dL — AB (ref 6–20)
CALCIUM: 9.4 mg/dL (ref 8.9–10.3)
CHLORIDE: 104 mmol/L (ref 101–111)
CO2: 23 mmol/L (ref 22–32)
CREATININE: 0.89 mg/dL (ref 0.44–1.00)
Glucose, Bld: 267 mg/dL — ABNORMAL HIGH (ref 65–99)
Potassium: 4.1 mmol/L (ref 3.5–5.1)
Sodium: 138 mmol/L (ref 135–145)
Total Bilirubin: 0.8 mg/dL (ref 0.3–1.2)
Total Protein: 8.6 g/dL — ABNORMAL HIGH (ref 6.5–8.1)

## 2018-03-14 LAB — I-STAT CHEM 8, ED
BUN: 22 mg/dL — ABNORMAL HIGH (ref 6–20)
Calcium, Ion: 1.14 mmol/L — ABNORMAL LOW (ref 1.15–1.40)
Chloride: 102 mmol/L (ref 101–111)
Creatinine, Ser: 0.8 mg/dL (ref 0.44–1.00)
Glucose, Bld: 258 mg/dL — ABNORMAL HIGH (ref 65–99)
HEMATOCRIT: 38 % (ref 36.0–46.0)
HEMOGLOBIN: 12.9 g/dL (ref 12.0–15.0)
Potassium: 3.9 mmol/L (ref 3.5–5.1)
Sodium: 139 mmol/L (ref 135–145)
TCO2: 24 mmol/L (ref 22–32)

## 2018-03-14 LAB — RAPID URINE DRUG SCREEN, HOSP PERFORMED
AMPHETAMINES: NOT DETECTED
BARBITURATES: NOT DETECTED
BENZODIAZEPINES: NOT DETECTED
Cocaine: NOT DETECTED
Opiates: NOT DETECTED
Tetrahydrocannabinol: NOT DETECTED

## 2018-03-14 LAB — PROTIME-INR
INR: 0.95
PROTHROMBIN TIME: 12.5 s (ref 11.4–15.2)

## 2018-03-14 LAB — ETHANOL: Alcohol, Ethyl (B): <10 mg/dL (ref <10)

## 2018-03-14 LAB — DIFFERENTIAL
BASOS ABS: 0 10*3/uL (ref 0.0–0.1)
Basophils Relative: 0 %
Eosinophils Absolute: 0.6 10*3/uL (ref 0.0–0.7)
Eosinophils Relative: 8 %
LYMPHS ABS: 2.6 10*3/uL (ref 0.7–4.0)
LYMPHS PCT: 34 %
MONO ABS: 0.4 10*3/uL (ref 0.1–1.0)
MONOS PCT: 5 %
NEUTROS ABS: 4 10*3/uL (ref 1.7–7.7)
NEUTROS PCT: 53 %

## 2018-03-14 LAB — APTT: aPTT: 26 seconds (ref 24–36)

## 2018-03-14 LAB — URINALYSIS, ROUTINE W REFLEX MICROSCOPIC
BILIRUBIN URINE: NEGATIVE
Bacteria, UA: NONE SEEN
Ketones, ur: NEGATIVE mg/dL
Leukocytes, UA: NEGATIVE
NITRITE: NEGATIVE
Protein, ur: 30 mg/dL — AB
SPECIFIC GRAVITY, URINE: 1.006 (ref 1.005–1.030)
pH: 8 (ref 5.0–8.0)

## 2018-03-14 LAB — I-STAT TROPONIN, ED: TROPONIN I, POC: 0.01 ng/mL (ref 0.00–0.08)

## 2018-03-14 MED ORDER — CLOPIDOGREL BISULFATE 75 MG PO TABS
75.0000 mg | ORAL_TABLET | Freq: Every day | ORAL | Status: DC
  Administered 2018-03-15: 75 mg via ORAL
  Filled 2018-03-14: qty 1

## 2018-03-14 MED ORDER — ACETAMINOPHEN 650 MG RE SUPP: 650.0000 mg | RECTAL | Status: DC | PRN

## 2018-03-14 MED ORDER — LEVOTHYROXINE SODIUM 100 MCG PO TABS
200.0000 ug | ORAL_TABLET | Freq: Every day | ORAL | Status: DC
  Administered 2018-03-15: 200 ug via ORAL
  Filled 2018-03-14: qty 2

## 2018-03-14 MED ORDER — MECLIZINE HCL 12.5 MG PO TABS
25.0000 mg | ORAL_TABLET | Freq: Once | ORAL | Status: AC
  Administered 2018-03-14: 25 mg via ORAL
  Filled 2018-03-14: qty 2

## 2018-03-14 MED ORDER — ENOXAPARIN SODIUM 40 MG/0.4ML ~~LOC~~ SOLN
40.0000 mg | SUBCUTANEOUS | Status: DC
  Administered 2018-03-14: 40 mg via SUBCUTANEOUS
  Filled 2018-03-14: qty 0.4

## 2018-03-14 MED ORDER — ACETAMINOPHEN 160 MG/5ML PO SOLN: 650.0000 mg | ORAL | Status: DC | PRN

## 2018-03-14 MED ORDER — ACETAMINOPHEN 325 MG PO TABS: 650.0000 mg | ORAL_TABLET | ORAL | Status: DC | PRN

## 2018-03-14 MED ORDER — FUROSEMIDE 40 MG PO TABS
40.0000 mg | ORAL_TABLET | Freq: Every day | ORAL | Status: DC
  Administered 2018-03-15: 40 mg via ORAL
  Filled 2018-03-14: qty 1

## 2018-03-14 MED ORDER — ISOSORBIDE MONONITRATE ER 60 MG PO TB24
30.0000 mg | ORAL_TABLET | Freq: Every day | ORAL | Status: DC
  Administered 2018-03-15: 30 mg via ORAL
  Filled 2018-03-14: qty 1

## 2018-03-14 MED ORDER — MECLIZINE HCL 25 MG PO TABS: 25.0000 mg | ORAL_TABLET | Freq: Three times a day (TID) | ORAL | 6 refills | Status: DC | PRN

## 2018-03-14 MED ORDER — ALBUTEROL SULFATE (2.5 MG/3ML) 0.083% IN NEBU: 3.0000 mL | INHALATION_SOLUTION | RESPIRATORY_TRACT | Status: DC | PRN

## 2018-03-14 MED ORDER — ATORVASTATIN CALCIUM 40 MG PO TABS: 80.0000 mg | ORAL_TABLET | Freq: Every day | ORAL | Status: DC

## 2018-03-14 MED ORDER — MECLIZINE HCL 12.5 MG PO TABS
25.0000 mg | ORAL_TABLET | Freq: Three times a day (TID) | ORAL | Status: DC | PRN
Start: 2018-03-14 — End: 2018-03-15
  Administered 2018-03-15: 25 mg via ORAL
  Filled 2018-03-14: qty 2

## 2018-03-14 MED ORDER — ONDANSETRON HCL 4 MG/2ML IJ SOLN
4.0000 mg | Freq: Four times a day (QID) | INTRAMUSCULAR | Status: DC | PRN
  Administered 2018-03-14 – 2018-03-15 (×2): 4 mg via INTRAVENOUS
  Filled 2018-03-14 (×2): qty 2

## 2018-03-14 MED ORDER — IOPAMIDOL (ISOVUE-370) INJECTION 76%
100.0000 mL | Freq: Once | INTRAVENOUS | Status: AC | PRN
  Administered 2018-03-14: 100 mL via INTRAVENOUS

## 2018-03-14 MED ORDER — INSULIN ASPART 100 UNIT/ML ~~LOC~~ SOLN
0.0000 [IU] | Freq: Three times a day (TID) | SUBCUTANEOUS | Status: DC
  Administered 2018-03-15 (×2): 7 [IU] via SUBCUTANEOUS

## 2018-03-14 MED ORDER — DIAZEPAM 5 MG PO TABS
5.0000 mg | ORAL_TABLET | Freq: Once | ORAL | Status: AC
  Administered 2018-03-14: 5 mg via ORAL
  Filled 2018-03-14: qty 1

## 2018-03-14 MED ORDER — INSULIN ASPART 100 UNIT/ML ~~LOC~~ SOLN: 0.0000 [IU] | Freq: Every day | SUBCUTANEOUS | Status: DC

## 2018-03-14 MED ORDER — SACUBITRIL-VALSARTAN 97-103 MG PO TABS: 1.0000 | ORAL_TABLET | Freq: Two times a day (BID) | ORAL | 6 refills | Status: DC

## 2018-03-14 MED ORDER — STROKE: EARLY STAGES OF RECOVERY BOOK
Freq: Once | Status: AC
  Administered 2018-03-15: 02:00:00

## 2018-03-14 MED ORDER — SODIUM CHLORIDE 0.9 % IV SOLN
INTRAVENOUS | Status: DC
  Administered 2018-03-14: 22:00:00 via INTRAVENOUS

## 2018-03-14 MED ORDER — ASPIRIN 81 MG PO CHEW
81.0000 mg | CHEWABLE_TABLET | Freq: Every day | ORAL | Status: DC
  Administered 2018-03-15: 81 mg via ORAL
  Filled 2018-03-14: qty 1

## 2018-03-14 MED ORDER — SENNOSIDES-DOCUSATE SODIUM 8.6-50 MG PO TABS: 1.0000 | ORAL_TABLET | Freq: Every evening | ORAL | Status: DC | PRN

## 2018-03-14 MED ORDER — ONDANSETRON HCL 4 MG/2ML IJ SOLN
4.0000 mg | INTRAMUSCULAR | Status: DC | PRN
  Administered 2018-03-14: 4 mg via INTRAVENOUS
  Filled 2018-03-14: qty 2

## 2018-03-14 NOTE — ED Provider Notes (Signed)
[facility] EMERGENCY DEPARTMENT Provider Note   CSN: [id number] Arrival date & time: 03/14/18  1635     History   Chief Complaint Chief Complaint  Patient presents with  . Code Stroke    HPI Maureen Jackson is a 62 y.o. female.  HPI  Pt was seen at 1640. Per pt, c/o sudden onset and persistence of constant "dizziness" that began at 1500 today PTA. Has been associated with entire left hand "numbness." Describes the dizziness as "everything is spinning." Pt states she had a similar episode several days ago which resolved. Pt states she was evaluated by her Cards MD today, rx antivert. Pt has not filled the rx yet. Denies CP/palpitations, no SOB/cough, no abd pain, no N/V/D, no visual changes, no focal motor weakness, no ataxia, no slurred speech, no facial droop.    Past Medical History:  Diagnosis Date  . 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 Coronary artery disease   . Diabetes mellitus   . Dyspnea   . Glaucoma   . Hypertension   . Morbid obesity ([place])   . Obstructive sleep apnea    does not wear CPAP  . Thyroid disease     Patient Active Problem List   Diagnosis Date Noted  . Ischemic cardiomyopathy 11/06/2017  . CAD, multiple vessel   . Pericardial effusion 09/07/2017  . AKI (acute kidney injury) ([place]) 09/07/2017  . Elevated troponin I level 09/07/2017  . Acute combined systolic (congestive) and diastolic (congestive) heart failure ([place]) 09/06/2017  . Cardiomegaly 09/05/2017  . Morbid obesity ([place]) 03/04/2013  . Labyrinthitis 03/04/2013  . Diabetes mellitus type 2 with complications, uncontrolled ([place]) 03/04/2013  . GASTRITIS 12/13/2006  . HIATAL HERNIA, HX OF 12/13/2006  . THYROIDECTOMY, HX OF 12/13/2006  . Hypothyroidism 10/04/2006  . OBESITY 10/04/2006  . TOBACCO ABUSE 10/04/2006  . CARPAL TUNNEL SYNDROME 10/04/2006  . Essential hypertension 10/04/2006  . GERD 10/04/2006  . POSTMENOPAUSAL STATUS 10/04/2006  . SKIN TAG 10/04/2006  . KNEE PAIN, LEFT 10/04/2006   . Sleep apnea 10/04/2006  . LEG EDEMA 10/04/2006    Past Surgical History:  Procedure Laterality Date  . ABDOMINAL HYSTERECTOMY    . CARDIAC CATHETERIZATION  09/12/2017  . CORONARY STENT INTERVENTION  09/12/2017   STENT RESOLUTE ONYX 5.36U44 drug eluting stent was successfully placed  . CORONARY STENT INTERVENTION N/A 09/12/2017   Procedure: CORONARY STENT INTERVENTION;  Surgeon: Leonie Man, MD;  Location: [place] [facility];  Service: Cardiovascular;  Laterality: N/A;  . LEFT HEART CATH AND CORONARY ANGIOGRAPHY N/A 09/12/2017   Procedure: LEFT HEART CATH AND CORONARY ANGIOGRAPHY;  Surgeon: Leonie Man, MD;  Location: [place] [facility];  Service: Cardiovascular;  Laterality: N/A;  . RIGHT/LEFT HEART CATH AND CORONARY ANGIOGRAPHY N/A 09/10/2017   Procedure: RIGHT/LEFT HEART CATH AND CORONARY ANGIOGRAPHY;  Surgeon: Troy Sine, MD;  Location: [place] [facility];  Service: Cardiovascular;  Laterality: N/A;  . THYROID SURGERY       OB History   None      Home Medications    Prior to Admission medications   Medication Sig Start Date End Date Taking? Authorizing Provider  amLODipine (NORVASC) 5 MG tablet Take 1 tablet (5 mg total) by mouth daily. 11/07/17   [name], PA-C  aspirin 81 MG chewable tablet Chew 1 tablet (81 mg total) by mouth daily. 09/14/17   Ghimire, Henreitta Leber, MD  atorvastatin (LIPITOR) 80 MG tablet Take 1 tablet (80  mg total) by mouth daily at 6 PM. 11/14/17   [name], PA-C  carvedilol (COREG) 25 MG tablet Take 1 tablet (25 mg total) by mouth 2 (two) times daily. 11/07/17 02/11/18  [name], PA-C  clopidogrel (PLAVIX) 75 MG tablet Take 1 tablet (75 mg total) by mouth daily with breakfast. 11/07/17   [name], PA-C  furosemide (LASIX) 40 MG tablet Take 1 tablet (40 mg total) by mouth daily. 09/14/17   Ghimire, Henreitta Leber, MD  glimepiride (AMARYL) 2 MG tablet Take 1 tablet by mouth daily at 12 noon. 11/05/17   [provider]  isosorbide mononitrate (IMDUR) 30 MG 24 hr tablet Take 1 tablet (30 mg total) by mouth daily. 11/07/17   [name], PA-C  levothyroxine (SYNTHROID, LEVOTHROID) 200 MCG tablet TK 1 T PO QD 10/15/17   [provider]  levothyroxine (SYNTHROID, LEVOTHROID) 50 MCG tablet TK 1 T PO D WITH 200MCG TABLET FOR A TOTAL DAILY DOSE OF 250MCG 10/12/17   [provider]  meclizine (ANTIVERT) 25 MG tablet Take 1 tablet (25 mg total) by mouth 3 (three) times daily as needed for dizziness. 03/14/18   Arnoldo Lenis, MD  metFORMIN (GLUCOPHAGE) 1000 MG tablet Take 1 tablet (1,000 mg total) by mouth 2 (two) times daily with a meal. 09/14/17   Ghimire, Henreitta Leber, MD  sacubitril-valsartan (ENTRESTO) 97-103 MG Take 1 tablet by mouth 2 (two) times daily. 03/14/18   Arnoldo Lenis, MD  VENTOLIN HFA 108 [phone] Base) MCG/ACT inhaler Inhale 1-2 puffs into the lungs every 6 (six) hours as needed for wheezing or shortness of breath. 09/14/17   Ghimire, Henreitta Leber, MD    Family History Family History  Problem Relation Age of Onset  . Diabetes Mother   . Hypertension Mother   . Cancer Mother        pancreas  . Hypertension Sister     Social History Social History   Tobacco Use  . Smoking status: Former Smoker    Last attempt to quit: 12/14/2005    Years since quitting: 12.2  . Smokeless tobacco: Never Used  . Tobacco comment: QUIT IN 2005  Substance Use Topics  . Alcohol use: No  . Drug use: No     Allergies   Aspirin   Review of Systems Review of Systems ROS: Statement: All systems negative except as marked or noted in the HPI; Constitutional: Negative for fever and chills. ; ; Eyes: Negative for eye pain, redness and discharge. ; ; ENMT: Negative for ear pain, hoarseness, nasal congestion, sinus pressure and sore throat. ; ; Cardiovascular: Negative for chest pain, palpitations, diaphoresis, dyspnea and peripheral edema. ; ; Respiratory: Negative for cough, wheezing and stridor. ; ;  Gastrointestinal: Negative for nausea, vomiting, diarrhea, abdominal pain, blood in stool, hematemesis, jaundice and rectal bleeding. . ; ; Genitourinary: Negative for dysuria, flank pain and hematuria. ; ; Musculoskeletal: Negative for back pain and neck pain. Negative for swelling and trauma.; ; Skin: Negative for pruritus, rash, abrasions, blisters, bruising and skin lesion.; ; Neuro: +"spinning," right hand numbness. Negative for headache, lightheadedness and neck stiffness. Negative for weakness, altered level of consciousness, altered mental status, extremity weakness, involuntary movement, seizure and syncope.       Physical Exam Updated Vital Signs BP (!) 146/79   Pulse 86   Temp 97.6 F (36.4 C)   Resp 11   Ht 5\' 7"  (1.702 m)   Wt (!) 138.3 kg (  305 lb)   SpO2 98%   BMI 47.77 kg/m    Patient Vitals for the past 24 hrs:  BP Temp Temp src Pulse Resp SpO2 Height Weight  03/14/18 1800 137/73 - - 85 17 99 % - -  03/14/18 1730 (!) 146/79 - - 86 11 98 % - -  03/14/18 1722 - 97.6 F (36.4 C) - - - - - -  03/14/18 1710 (!) 177/77 97.6 F (36.4 C) Oral 66 (!) 22 100 % - -  03/14/18 1657 (!) 172/84 98 F (36.7 C) Oral - (!) 22 99 % - -  03/14/18 1635 - - - - - - 5\' 7"  (1.702 m) (!) 138.3 kg (305 lb)     Physical Exam 1645: Physical examination:  Nursing notes reviewed; Vital signs and O2 SAT reviewed;  Constitutional: Well developed, Well nourished, Well hydrated, In no acute distress; Head:  Normocephalic, atraumatic; Eyes: EOMI, PERRL, No scleral icterus; ENMT: Mouth and pharynx normal, Mucous membranes moist; Neck: Supple, Full range of motion, No lymphadenopathy; Cardiovascular: Regular rate and rhythm, No gallop; Respiratory: Breath sounds clear & equal bilaterally, No wheezes.  Speaking full sentences with ease, Normal respiratory effort/excursion; Chest: Nontender, Movement normal; Abdomen: Soft, Nontender, Nondistended, Normal bowel sounds; Genitourinary: No CVA tenderness;  Extremities: Peripheral pulses normal, No tenderness, No edema, No calf edema or asymmetry.; Neuro: AA&Ox3, Major CN grossly intact. Speech clear.  No facial droop. +righth horizontal end gaze fatigable nystagmus which reproduces pt's "spinning." Grips equal. Strength 5/5 equal bilat UE's and LE's.  DTR 2/4 equal bilat UE's and LE's.  No gross sensory deficits.  Normal cerebellar testing bilat UE's (finger-nose) and LE's (heel-shin). .; Skin: Color normal, Warm, Dry.   ED Treatments / Results  Labs (all labs ordered are listed, but only abnormal results are displayed)   EKG EKG Interpretation  Date/Time:  Thursday March 14 2018 17:07:34 EDT Ventricular Rate:  89 PR Interval:    QRS Duration: 165 QT Interval:  430 QTC Calculation: 524 R Axis:   39 Text Interpretation:  Sinus rhythm Left bundle branch block When compared with ECG of 09/12/2017 No significant change was found Confirmed by Francine Graven [phone]) on 03/14/2018 5:10:33 PM   Radiology   Procedures Procedures (including critical care time)  Medications Ordered in ED Medications  meclizine (ANTIVERT) tablet 25 mg (has no administration in time range)  ondansetron (ZOFRAN) injection 4 mg (has no administration in time range)     Initial Impression / Assessment and Plan / ED Course  I have reviewed the triage vital signs and the nursing notes.  Pertinent labs & imaging results that were available during my care of the patient were reviewed by me and considered in my medical decision making (see chart for details).  MDM Reviewed: previous chart, nursing note and vitals Reviewed previous: labs and ECG Interpretation: labs, ECG and CT scan Total time providing critical care: 30-74 minutes. This excludes time spent performing separately reportable procedures and services. Consults: neurology and admitting MD   CRITICAL CARE Performed by: Alfonzo Feller Total critical care time: 35 minutes Critical care time was  exclusive of separately billable procedures and treating other patients. Critical care was necessary to treat or prevent imminent or life-threatening deterioration. Critical care was time spent personally by me on the following activities: development of treatment plan with patient and/or surrogate as well as nursing, discussions with consultants, evaluation of patient's response to treatment, examination of patient, obtaining history from patient or surrogate, ordering  and performing treatments and interventions, ordering and review of laboratory studies, ordering and review of radiographic studies, pulse oximetry and re-evaluation of patient's condition.   Results for orders placed or performed during the hospital encounter of 03/14/18  Ethanol  Result Value Ref Range   Alcohol, Ethyl (B) <10 <10 mg/dL  Protime-INR  Result Value Ref Range   Prothrombin Time 12.5 11.4 - 15.2 seconds   INR 0.95   APTT  Result Value Ref Range   aPTT 26 24 - 36 seconds  CBC  Result Value Ref Range   WBC 7.7 4.0 - 10.5 K/uL   RBC 4.89 3.87 - 5.11 MIL/uL   Hemoglobin 12.6 12.0 - 15.0 g/dL   HCT 39.0 36.0 - 46.0 %   MCV 79.8 78.0 - 100.0 fL   MCH 25.8 (L) 26.0 - 34.0 pg   MCHC 32.3 30.0 - 36.0 g/dL   RDW 13.5 11.5 - 15.5 %   Platelets 257 150 - 400 K/uL  Differential  Result Value Ref Range   Neutrophils Relative % 53 %   Neutro Abs 4.0 1.7 - 7.7 K/uL   Lymphocytes Relative 34 %   Lymphs Abs 2.6 0.7 - 4.0 K/uL   Monocytes Relative 5 %   Monocytes Absolute 0.4 0.1 - 1.0 K/uL   Eosinophils Relative 8 %   Eosinophils Absolute 0.6 0.0 - 0.7 K/uL   Basophils Relative 0 %   Basophils Absolute 0.0 0.0 - 0.1 K/uL  Comprehensive metabolic panel  Result Value Ref Range   Sodium 138 135 - 145 mmol/L   Potassium 4.1 3.5 - 5.1 mmol/L   Chloride 104 101 - 111 mmol/L   CO2 23 22 - 32 mmol/L   Glucose, Bld 267 (H) 65 - 99 mg/dL   BUN 24 (H) 6 - 20 mg/dL   Creatinine, Ser 0.89 0.44 - 1.00 mg/dL   Calcium  9.4 8.9 - 10.3 mg/dL   Total Protein 8.6 (H) 6.5 - 8.1 g/dL   Albumin 3.3 (L) 3.5 - 5.0 g/dL   AST 33 15 - 41 U/L   ALT 33 14 - 54 U/L   Alkaline Phosphatase 215 (H) 38 - 126 U/L   Total Bilirubin 0.8 0.3 - 1.2 mg/dL   GFR calc non Af Amer >60 >60 mL/min   GFR calc Af Amer >60 >60 mL/min   Anion gap 11 5 - 15  Urinalysis, Routine w reflex microscopic  Result Value Ref Range   Color, Urine COLORLESS (A) YELLOW   APPearance CLEAR CLEAR   Specific Gravity, Urine 1.006 1.005 - 1.030   pH 8.0 5.0 - 8.0   Glucose, UA >=500 (A) NEGATIVE mg/dL   Hgb urine dipstick SMALL (A) NEGATIVE   Bilirubin Urine NEGATIVE NEGATIVE   Ketones, ur NEGATIVE NEGATIVE mg/dL   Protein, ur 30 (A) NEGATIVE mg/dL   Nitrite NEGATIVE NEGATIVE   Leukocytes, UA NEGATIVE NEGATIVE   RBC / HPF 0-5 0 - 5 RBC/hpf   WBC, UA 0-5 0 - 5 WBC/hpf   Bacteria, UA NONE SEEN NONE SEEN   Squamous Epithelial / LPF 0-5 (A) NONE SEEN  I-Stat Chem 8, ED  (not at [facility])  Result Value Ref Range   Sodium 139 135 - 145 mmol/L   Potassium 3.9 3.5 - 5.1 mmol/L   Chloride 102 101 - 111 mmol/L   BUN 22 (H) 6 - 20 mg/dL   Creatinine, Ser 0.80 0.44 - 1.00 mg/dL   Glucose, Bld 258 (H) 65 -  99 mg/dL   Calcium, Ion 1.14 (L) 1.15 - 1.40 mmol/L   TCO2 24 22 - 32 mmol/L   Hemoglobin 12.9 12.0 - 15.0 g/dL   HCT 38.0 36.0 - 46.0 %   Ct Head Code Stroke Wo Contrast Result Date: 03/14/2018 CLINICAL DATA:  Code stroke. 62 year old female with dizziness and left hand numbness beginning at 1500 hours. EXAM: CT HEAD WITHOUT CONTRAST TECHNIQUE: Contiguous axial images were obtained from the base of the skull through the vertex without intravenous contrast. COMPARISON:  None. FINDINGS: Brain: Cerebral volume is within normal limits for age. No midline shift, ventriculomegaly, mass effect, evidence of mass lesion, intracranial hemorrhage or evidence of cortically based acute infarction. No cortical encephalomalacia. Mild for age cerebral white matter  hypodensity, primarily limited to the left periatrial white matter. Partially empty sella. Vascular: Calcified atherosclerosis at the skull base. No suspicious intracranial vascular hyperdensity. Skull: Negative. Sinuses/Orbits: Clear aside from mucous retention cysts in the right sphenoid sinus. Other: Right forehead benign scalp lipoma incidentally noted (series 3, image 12). No acute orbit or scalp soft tissue findings. ASPECTS [facility] Stroke Program Early CT Score) - Ganglionic level infarction (caudate, lentiform nuclei, internal capsule, insula, M1-M3 cortex): 7 - Supraganglionic infarction (M4-M6 cortex): 3 Total score (0-10 with 10 being normal): 10 IMPRESSION: 1. No acute cortically based infarct or acute intracranial hemorrhage identified. ASPECTS is 10. 2. Mild for age nonspecific white matter disease, most commonly due to chronic small vessel disease. 3. Study discussed by telephone with Dr. Francine Graven on 03/14/2018 at 16:58 . Electronically Signed   By: Genevie Ann M.D.   On: 03/14/2018 16:58    1700:  Pt with sudden onset spinning with left hand numbness: Code Stroke called on pt's arrival. TeleNeuro Dr. Gaye Pollack has evaluated pt: states pt's symptoms are resolving, pt with multiple risk factors for TIA/stroke and this is pt's 2nd episode this week, admit pt for TIA workup.   1800:  T/C returned from [facility] Dr. Lorin Mercy, case discussed, including:  HPI, pertinent PM/SHx, VS/PE, dx testing, ED course and treatment:  Agreeable to admit.     Final Clinical Impressions(s) / ED Diagnoses   Final diagnoses:  None    ED Discharge Orders    None       Francine Graven, DO 03/18/18 0805

## 2018-03-14 NOTE — ED Triage Notes (Addendum)
Patient brought in via EMS from home. Patient c/o dizziness and left hand numbness. Per patient "room feels like it's spinning." Patient states dizziness and numbness started at 3pm. Per EMS patient seen earlier today by cardiologist and placed on meclizine. EDP ion room assessing patient. Code stroke called.

## 2018-03-14 NOTE — Patient Instructions (Signed)
Medication Instructions:  Your physician has recommended you make the following change in your medication:  Start Meclizine 25 mg by Mouth every 8 hours as needed for dizziness  Increase Entresto to 97-103 mg Two Times Daily    Labwork: Your physician recommends that you return for lab work in: 2 Weeks    Testing/Procedures: NONE   Follow-Up: Your physician recommends that you schedule a follow-up appointment in: 2 Weeks for a Blood Pressure Check.   Your physician recommends that you schedule a follow-up appointment in: 1 Month    Any Other Special Instructions Will Be Listed Below (If Applicable).     If you need a refill on your cardiac medications before your next appointment, please call your pharmacy.  Thank you for choosing [facility]!

## 2018-03-14 NOTE — ED Notes (Signed)
Code stroke called out at 1642

## 2018-03-14 NOTE — ED Triage Notes (Signed)
Patient taken to CT at 1645.

## 2018-03-14 NOTE — H&P (Signed)
History and Physical    Maureen Jackson ENI:[id number] DOB: [birth year] DOA: 03/14/2018  PCP: Neale Burly, MD Consultants:  [place] - cardiology Patient coming from:  Home - lives alone; NOK: Daughter, [phone]  Chief Complaint:  Dizziness  HPI: Maureen Jackson is a 62 y.o. female with medical history significant of morbid obesity, hypothyroidism, chronic combined CHF (EF 30-35% with grade 1 diastolic dysfunction in 8/67), OSA not on CPAP, HTN, DM, and glaucoma presenting with dizziness.   Tuesday, she developed dizziness for about an hour.  She saw her heart doctor today and told him about it.  He gave her an rx for meclizine.  When she got home, the room started spinning.  She was scared to stand up to get the pills.  She describes the dizziness as the room going fast, feeling sick on her stomach, and her hands itching.  Dr. Harl Bowie thought it was vertigo and she does have a h/o this about 2 years ago.  She was given Meclizine in the ER and "it ain't fast as it was."  However, "it looks like the hand sanitizer is going up into the ceiling".  She has been feeling well other than the dizziness.      ED Course:  TIA evaluation.  Head CT negative for stroke.  Teleneuro recommends TIA r/o.  Given meclizine.  Review of Systems: As per HPI; otherwise review of systems reviewed and negative.   Ambulatory Status:   Ambulates with a cane or walker at times  Past Medical History:  Diagnosis Date  . 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 Coronary artery disease 09/2017   stents placed  . Diabetes mellitus   . Dyspnea   . Glaucoma   . Hypertension   . Morbid obesity ([place])   . Obstructive sleep apnea    does not wear CPAP  . Thyroid disease     Past Surgical History:  Procedure Laterality Date  . ABDOMINAL HYSTERECTOMY    . CARDIAC CATHETERIZATION  09/12/2017  . CORONARY STENT INTERVENTION  09/12/2017   STENT RESOLUTE ONYX 6.19J09 drug eluting stent was successfully placed  . CORONARY STENT  INTERVENTION N/A 09/12/2017   Procedure: CORONARY STENT INTERVENTION;  Surgeon: Leonie Man, MD;  Location: [facility];  Service: Cardiovascular;  Laterality: N/A;  . LEFT HEART CATH AND CORONARY ANGIOGRAPHY N/A 09/12/2017   Procedure: LEFT HEART CATH AND CORONARY ANGIOGRAPHY;  Surgeon: Leonie Man, MD;  Location: [facility];  Service: Cardiovascular;  Laterality: N/A;  . RIGHT/LEFT HEART CATH AND CORONARY ANGIOGRAPHY N/A 09/10/2017   Procedure: RIGHT/LEFT HEART CATH AND CORONARY ANGIOGRAPHY;  Surgeon: Troy Sine, MD;  Location: [facility];  Service: Cardiovascular;  Laterality: N/A;  . THYROID SURGERY      Social History   Socioeconomic History  . Marital status: Widowed    Spouse name: Not on file  . Number of children: Not on file  . Years of education: Not on file  . Highest education level: Not on file  Occupational History  . 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 Financial resource strain: Not on file  . Food insecurity:    Worry: Not on file    Inability: Not on file  . Transportation needs:    Medical: Not on file    Non-medical: Not on file  Tobacco Use  . Smoking status: Former Smoker    Last attempt  to quit: 12/14/2005    Years since quitting: 12.2  . Smokeless tobacco: Never Used  . Tobacco comment: QUIT IN 2005  Substance and Sexual Activity  . Alcohol use: No  . Drug use: No  . Sexual activity: Never  Lifestyle  . Physical activity:    Days per week: Not on file    Minutes per session: Not on file  . Stress: Not on file  Relationships  . Social connections:    Talks on phone: Not on file    Gets together: Not on file    Attends religious service: Not on file    Active member of club or organization: Not on file    Attends meetings of clubs or organizations: Not on file    Relationship status: Not on file  . Intimate partner violence:    Fear of current or ex partner: Not on file     Emotionally abused: Not on file    Physically abused: Not on file    Forced sexual activity: Not on file  Other Topics Concern  . Not on file  Social History Narrative  . Not on file    Allergies  Allergen Reactions  . Aspirin Nausea Only    Family History  Problem Relation Age of Onset  . Diabetes Mother   . Hypertension Mother   . Cancer Mother        pancreas  . Hypertension Sister     Prior to Admission medications   Medication Sig Start Date End Date Taking? Authorizing Provider  amLODipine (NORVASC) 5 MG tablet Take 1 tablet (5 mg total) by mouth daily. 11/07/17  Yes [name], PA-C  aspirin 81 MG chewable tablet Chew 1 tablet (81 mg total) by mouth daily. 09/14/17  Yes Ghimire, Henreitta Leber, MD  atorvastatin (LIPITOR) 80 MG tablet Take 1 tablet (80 mg total) by mouth daily at 6 PM. 11/14/17  Yes [name], PA-C  clopidogrel (PLAVIX) 75 MG tablet Take 1 tablet (75 mg total) by mouth daily with breakfast. 11/07/17  Yes [name], PA-C  furosemide (LASIX) 40 MG tablet Take 1 tablet (40 mg total) by mouth daily. 09/14/17  Yes Ghimire, Henreitta Leber, MD  isosorbide mononitrate (IMDUR) 30 MG 24 hr tablet Take 1 tablet (30 mg total) by mouth daily. 11/07/17  Yes [name], PA-C  levothyroxine (SYNTHROID, LEVOTHROID) 200 MCG tablet TK 1 T PO QD 10/15/17  Yes [provider]  levothyroxine (SYNTHROID, LEVOTHROID) 50 MCG tablet TK 1 T PO D WITH 200MCG TABLET FOR A TOTAL DAILY DOSE OF 250MCG 10/12/17  Yes [provider]  meclizine (ANTIVERT) 25 MG tablet Take 1 tablet (25 mg total) by mouth 3 (three) times daily as needed for dizziness. 03/14/18  Yes Branch, Alphonse Guild, MD  metFORMIN (GLUCOPHAGE) 1000 MG tablet Take 1 tablet (1,000 mg total) by mouth 2 (two) times daily with a meal. 09/14/17  Yes Ghimire, Henreitta Leber, MD  sacubitril-valsartan (ENTRESTO) 97-103 MG Take 1 tablet by mouth 2 (two) times daily. 03/14/18  Yes Branch, Alphonse Guild, MD  carvedilol  (COREG) 25 MG tablet Take 1 tablet (25 mg total) by mouth 2 (two) times daily. 11/07/17 02/11/18  [name], PA-C  ONETOUCH VERIO test strip USE TO TEST BLOOD SUGAR BID 01/05/18   [provider]  VENTOLIN HFA 108 (90 Base) MCG/ACT inhaler Inhale 1-2 puffs into the lungs every 6 (six) hours as needed for wheezing or shortness of breath. Patient  taking differently: Inhale 1-2 puffs into the lungs as needed for wheezing or shortness of breath.  09/14/17   Jonetta Osgood, MD    Physical Exam: Vitals:   03/14/18 1730 03/14/18 1800 03/14/18 1830 03/14/18 1900  BP: (!) 146/79 137/73 (!) 136/95 (!) 151/80  Pulse: 86 85 86 81  Resp: 11 17 16 19   Temp:      TempSrc:      SpO2: 98% 99% 100% 99%  Weight:      Height:         General:  Appears calm and comfortable and is NAD Eyes:  PERRL, EOMI, normal lids, iris ENT:  grossly normal hearing, lips & tongue, mmm Neck:  no LAD, masses or thyromegaly; no carotid bruits Cardiovascular:  RRR, no m/r/g. No LE edema.  Respiratory:   CTA bilaterally with no wheezes/rales/rhonchi.  Normal respiratory effort. Abdomen:  soft, NT, ND, NABS Skin:  no rash or induration seen on limited exam Musculoskeletal:  grossly normal tone BUE/BLE, good ROM, no bony abnormality Lower extremity:  No LE edema.  Limited foot exam with no ulcerations.  2+ distal pulses. Psychiatric:  grossly normal mood and affect, speech fluent and appropriate, AOx3 Neurologic:  CN 2-12 grossly intact, moves all extremities in coordinated fashion, sensation intact    Radiological Exams on Admission: Ct Head Code Stroke Wo Contrast  Result Date: 03/14/2018 CLINICAL DATA:  Code stroke. 62 year old female with dizziness and left hand numbness beginning at 1500 hours. EXAM: CT HEAD WITHOUT CONTRAST TECHNIQUE: Contiguous axial images were obtained from the base of the skull through the vertex without intravenous contrast. COMPARISON:  None. FINDINGS: Brain: Cerebral volume is  within normal limits for age. No midline shift, ventriculomegaly, mass effect, evidence of mass lesion, intracranial hemorrhage or evidence of cortically based acute infarction. No cortical encephalomalacia. Mild for age cerebral white matter hypodensity, primarily limited to the left periatrial white matter. Partially empty sella. Vascular: Calcified atherosclerosis at the skull base. No suspicious intracranial vascular hyperdensity. Skull: Negative. Sinuses/Orbits: Clear aside from mucous retention cysts in the right sphenoid sinus. Other: Right forehead benign scalp lipoma incidentally noted (series 3, image 12). No acute orbit or scalp soft tissue findings. ASPECTS [facility] Stroke Program Early CT Score) - Ganglionic level infarction (caudate, lentiform nuclei, internal capsule, insula, M1-M3 cortex): 7 - Supraganglionic infarction (M4-M6 cortex): 3 Total score (0-10 with 10 being normal): 10 IMPRESSION: 1. No acute cortically based infarct or acute intracranial hemorrhage identified. ASPECTS is 10. 2. Mild for age nonspecific white matter disease, most commonly due to chronic small vessel disease. 3. Study discussed by telephone with Dr. Francine Graven on 03/14/2018 at 16:58 . Electronically Signed   By: Genevie Ann M.D.   On: 03/14/2018 16:58    EKG: Independently reviewed.  NSR with rate 89; LBBB; NSCSLT   Labs on Admission: I have personally reviewed the available labs and imaging studies at the time of the admission.  Pertinent labs:   Glucose 267 AP 215 Albumin 3.3 Essentially normal CBC ETOH negative  Assessment/Plan Principal Problem:   Dizziness Active Problems:   Hypothyroidism   Essential hypertension   Sleep apnea   Morbid obesity ([place])   Diabetes mellitus type 2 with complications, uncontrolled (H[facility])   Chronic combined systolic and diastolic CHF (congestive heart failure) ([facility])   Dizziness -Patient with transient episode of dizziness 2 days ago, today with persistent  dizziness -Description is most c/w vertigo -Mild improvement with meclizine -Teleneurology was consulted for possible stroke  and recommends observation -Will place in observation status for CVA/TIA evaluation -Telemetry monitoring -Patient refuses MRI; will obtain CTA head/neck -Risk stratification with FLP, A1c -Continue ASA and Plavix daily -PT/OT/ST/Nutrition Consults -Continue Meclizine -Will give Valium 5 mg PO overnight x 1 -Neurology consult in AM -If patient is not improved tomorrow, consider PT vestibular evaluation and/or repeat Echo  HTN -Allow permissive HTN -Treat BP only if >220/120, and then with goal of 15% reduction -Hold CCB, Coreg, and Entresto and plan to restart in 48-72 hours  HLD -Check FLP -Continue Lipitor 80 mg daily  Chronic combined CHF -09/18/17 echo with EF 30-35% and moderate pericardial effusion, stable -multivessel CAD s/p stents -She was seen today by Dr. Harl Bowie, no need for further cardiology inpatient consultation unless new issues arise  DM -A1c was 7.1 in 9/18, will repeat -Suboptimal control today -Hold Metformin -Cover with resistant scale SSI  Morbid obesity -Her weight appears to be 7 pounds less than on 09/13/17 when she was discharged for new CHF diagnosis -Ongoing efforts at weight loss should be encouraged -She appears to be quite sedentary and encouraging mobility is also important  Hypothyroidism -Patient has been taking 250 mcg synthroid but reports that thyroid testing was done by her PCP (not available at this time) and that Synthroid was decreased to 200 mcg daily -Will continue at 200 mcg for now -Can request records from PCP if there are concerns here, or she can f/u as an outpatient for this issue  OSA -She reports not having been prescribed CPAP -Outpatient f/u    DVT prophylaxis: Lovenox  Code Status:  Full - confirmed with patient/friends Family Communication: Friends present throughout  evaluation Disposition Plan:  Home once clinically improved Consults called: Neurology  Admission status: It is my clinical opinion that referral for OBSERVATION is reasonable and necessary in this patient based on the above information provided. The aforementioned taken together are felt to place the patient at high risk for further clinical deterioration. However it is anticipated that the patient may be medically stable for discharge from the hospital within 24 to 48 hours.      Karmen Bongo MD [facility]  If note is complete, please contact covering daytime or nighttime physician. [email or web address] Password [facility]  03/14/2018, 7:32 PM

## 2018-03-14 NOTE — Progress Notes (Signed)
Clinical Summary Maureen Jackson is a 62 y.o.female seen today for follow up of the following medical problems.  1. Chronic systolic HF/ICM/CAD - patient admitted with acute onset CHF 08/2017.  - echo 08/2017 LVEF 30-35%, grade I diastoilc dysfunction - 09/2017 cath as reported below, found to have severe multivessel disease of LAD, LCX, and RCA. Mean PA 33, PCWP 25, CI 2.7 - seen by CT surgery, recs were for PCI as opposed to CABG. Received DES to LAD and DES x 2 to RCA, LCX disease managed medically. Recs for lifelong plavix per interventional cards -discharge weight 09/13/17 312 lbs    - tolerating entersto without troubles.   - no recent SOB/DOE/LE edema     2. Hypothyroidism - TSH 14 during recent admission - replacement therapy per pcp   3. Pericardial effusion - 09/2017 echo moderate pericardial effusion, stable by serial imaging without evidence of tamponade - patient with severe hypothyroidism, probable cause.   4. Vertigo - followed by pcp Past Medical History:  Diagnosis Date  . 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 Coronary artery disease   . Diabetes mellitus   . Dyspnea   . Glaucoma   . Hypertension   . Morbid obesity ([place])   . Obstructive sleep apnea    does not wear CPAP  . Thyroid disease      Allergies  Allergen Reactions  . Aspirin Nausea Only     Current Outpatient Medications  Medication Sig Dispense Refill  . amLODipine (NORVASC) 5 MG tablet Take 1 tablet (5 mg total) by mouth daily. 90 tablet 3  . aspirin 81 MG chewable tablet Chew 1 tablet (81 mg total) by mouth daily. 60 tablet 0  . atorvastatin (LIPITOR) 80 MG tablet Take 1 tablet (80 mg total) by mouth daily at 6 PM. 90 tablet 3  . carvedilol (COREG) 25 MG tablet Take 1 tablet (25 mg total) by mouth 2 (two) times daily. 180 tablet 3  . clopidogrel (PLAVIX) 75 MG tablet Take 1 tablet (75 mg total) by mouth daily with breakfast. 90 tablet 3  . furosemide (LASIX) 40 MG tablet  Take 1 tablet (40 mg total) by mouth daily. 30 tablet 0  . glimepiride (AMARYL) 2 MG tablet Take 1 tablet by mouth daily at 12 noon.    . isosorbide mononitrate (IMDUR) 30 MG 24 hr tablet Take 1 tablet (30 mg total) by mouth daily. 90 tablet 3  . levothyroxine (SYNTHROID, LEVOTHROID) 200 MCG tablet TK 1 T PO QD  0  . levothyroxine (SYNTHROID, LEVOTHROID) 50 MCG tablet TK 1 T PO D WITH 200MCG TABLET FOR A TOTAL DAILY DOSE OF 250MCG  0  . metFORMIN (GLUCOPHAGE) 1000 MG tablet Take 1 tablet (1,000 mg total) by mouth 2 (two) times daily with a meal. 60 tablet 0  . sacubitril-valsartan (ENTRESTO) 49-51 MG Take 1 tablet by mouth 2 (two) times daily. 60 tablet 3  . VENTOLIN HFA 108 (90 Base) MCG/ACT inhaler Inhale 1-2 puffs into the lungs every 6 (six) hours as needed for wheezing or shortness of breath. 1 Inhaler 0   No current facility-administered medications for this visit.      Past Surgical History:  Procedure Laterality Date  . ABDOMINAL HYSTERECTOMY    . CARDIAC CATHETERIZATION  09/12/2017  . CORONARY STENT INTERVENTION  09/12/2017   STENT RESOLUTE ONYX 7.82U23 drug eluting stent was successfully placed  . CORONARY STENT INTERVENTION N/A 09/12/2017   Procedure: CORONARY STENT  INTERVENTION;  Surgeon: Leonie Man, MD;  Location: [facility];  Service: Cardiovascular;  Laterality: N/A;  . LEFT HEART CATH AND CORONARY ANGIOGRAPHY N/A 09/12/2017   Procedure: LEFT HEART CATH AND CORONARY ANGIOGRAPHY;  Surgeon: Leonie Man, MD;  Location: [place] [facility];  Service: Cardiovascular;  Laterality: N/A;  . RIGHT/LEFT HEART CATH AND CORONARY ANGIOGRAPHY N/A 09/10/2017   Procedure: RIGHT/LEFT HEART CATH AND CORONARY ANGIOGRAPHY;  Surgeon: Troy Sine, MD;  Location: [place] [facility];  Service: Cardiovascular;  Laterality: N/A;  . THYROID SURGERY       Allergies  Allergen Reactions  . Aspirin Nausea Only      Family History  Problem Relation Age of Onset  . Diabetes  Mother   . Hypertension Mother   . Cancer Mother        pancreas  . Hypertension Sister      Social History Maureen Jackson reports that she has quit smoking. She has never used smokeless tobacco. Maureen Jackson reports that she does not drink alcohol.   Review of Systems CONSTITUTIONAL: No weight loss, fever, chills, weakness or fatigue.  HEENT: Eyes: No visual loss, blurred vision, double vision or yellow sclerae.No hearing loss, sneezing, congestion, runny nose or sore throat.  SKIN: No rash or itching.  CARDIOVASCULAR: per hpi RESPIRATORY: No shortness of breath, cough or sputum.  GASTROINTESTINAL: No anorexia, nausea, vomiting or diarrhea. No abdominal pain or blood.  GENITOURINARY: No burning on urination, no polyuria NEUROLOGICAL: No headache, dizziness, syncope, paralysis, ataxia, numbness or tingling in the extremities. No change in bowel or bladder control.  MUSCULOSKELETAL: No muscle, back pain, joint pain or stiffness.  LYMPHATICS: No enlarged nodes. No history of splenectomy.  PSYCHIATRIC: No history of depression or anxiety.  ENDOCRINOLOGIC: No reports of sweating, cold or heat intolerance. No polyuria or polydipsia.  Marland Kitchen   Physical Examination   Vitals:   03/14/18 1303  BP: (!) 160/80  Pulse: 88  SpO2: 94%   Vitals:   03/14/18 1303  Weight: (!) 305 lb (138.3 kg)  Height: 5\' 7"  (1.702 m)    Gen: resting comfortably, no acute distress HEENT: no scleral icterus, pupils equal round and reactive, no palptable cervical adenopathy,  CV: RRR, no m/r/g, no jvd Resp: Clear to auscultation bilaterally GI: abdomen is soft, non-tender, non-distended, normal bowel sounds, no hepatosplenomegaly MSK: extremities are warm, no edema.  Skin: warm, no rash Neuro:  no focal deficits Psych: appropriate affect   Diagnostic Studies 10/2018diagnosticcath  Prox RCA lesion, 30 %stenosed.  Prox RCA to Mid RCA lesion, 70 %stenosed.  Mid RCA lesion, 90 %stenosed.  RPDA  lesion, 80 %stenosed.  Prox Cx lesion, 20 %stenosed.  Ost 1st Mrg lesion, 80 %stenosed.  1st Mrg-2 lesion, 80 %stenosed.  1st Mrg-1 lesion, 90 %stenosed.  Mid Cx-2 lesion, 50 %stenosed.  Mid Cx-1 lesion, 60 %stenosed.  Prox LAD lesion, 60 %stenosed.  Mid LAD lesion, 85 %stenosed.  Severe multivessel CAD with 60% proximal LAD stenosis and 85% mid LAD stenosis; 80, 90 and 80% OM1 stenosis of the left circumflex with 60% mid AV groove and 50% mid distal AV groove stenosis; dominant RCA with 30% proximal followed by 70% proximal stenosis, 90% mid stenosis, and diffuse mid distal PDA stenosis of at least 80%.  LVEDP 33 mmHg; EF by previous echo 30-35%; left ventriculography not performed.  Moderate right heart pressure elevation with mild/moderate pulmonary hypertension.  RECOMMENDATION: In this diabetic female with significant multivessel CAD, recommend surgical  consultation for consideration for CABG revascularization.  09/2017 PCI  LESION #1  Prox LAD lesion, 60 %stenosed. Prox LAD to Mid LAD lesion, 65 %stenosed. Mid LAD lesion, 85 %stenosed. Tandem Lesions.  A STENT RESOLUTE ONYX [id number] drug eluting stent was successfully placed. Post-dilated in tapered fashion: 3.6 - 2.9 mm  Post intervention, there is a 0% residual stenosis.  LESION $2  Prox RCA lesion, 45 %stenosed. Prox RCA to Mid RCA lesion, 70 %stenosed. Mid RCA lesion, 90 %stenosed. Tandem Lesions  A STENT RESOLUTE ONYX [id number] drug eluting stent and STENT RESOLUTE ONYX 2.75X26 drug-eluting stent were successfully placed in overlapping fashion.  Post intervention, there is a 0% residual stenosis. Post-dilated in tapered fashion: 3.6 - 3.1 mm  RPDA lesion, 80 %stenosed. Plan Medical management.  Successful 2 vessel PCI with stable circumflex disease. Plan is to treat circumflex flex disease medically for now.   Plan: Return to nursing unit for post PCI care TR band removal. Continue to optimize  medical management, but from a post Standpoint she will be able to be discharged tomorrow. Continue aspirin plus Plavix for minimum one year, but would continue Plavix lifelong     08/2017 echo Study Conclusions  - Procedure narrative: Transthoracic echocardiography. Image quality was fair. The study was technically difficult, as a result of poor sound wave transmission. Intravenous contrast (Definity) was administered. - Left ventricle: The cavity size was mildly dilated. Systolic function was moderately to severely reduced. The estimated ejection fraction was in the range of 30% to 35%. Diffuse hypokinesis. Doppler parameters are consistent with abnormal left ventricular relaxation (grade 1 diastolic dysfunction). Mild concentric and moderate focal basal septal hypertrophy. - Ventricular septum: Septal motion showed abnormal function and dyssynergy. 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 - Aortic valve: Mildly calcified annulus. - Systemic veins: IVC is dilated with normal respiratory variation. It was suboptimally visualized. Estimated right atrial pressure: 8 mmHg. - Pericardium, extracardiac: Moderate size pericardial effusion. There is some right atrial inversion noted, but no frank tamponade physiology.     Assessment and Plan  1. Chronic systolic HF - new diagnosis during recent 08/2017 admission - cath with multivessel disease, s/p stents to LAD and RCA.  - no recent symptoms, we will increase entresto to 97/103mg  bid, repeat BMET in 2 weeks  2. CAD - cath with multivessel disease, s/p stents to LAD and RCA.  - no symptoms, continue current meds  3. Pericardial effusion - stable by serial imaging. Potentially related to her severe hypthyroidism - we will continue to monitor at this time  4. HTN - elevated today in clinic, increasing entresto dose as reported above.    - f/u 2 weeks nursing visit.  Pending vitals and labs likely start aldactone. Due to BMI could consider slightly higher dose coreg as well over time.    F/u 1 month       Arnoldo Lenis, M.D.

## 2018-03-14 NOTE — ED Notes (Signed)
Patient reports numbness in left hand now resolved. EDP aware.

## 2018-03-14 NOTE — Consult Note (Addendum)
TeleNeurology Consult Note  Maureen Jackson     Consulting Physician: Clabe Seal Code Status: Prior Primary Care Physician:  Neale Burly, MD  DOB:  March 03, 1956   Age: 62 y.o.  Date of Service: March 14, 2018 Admit Date:  03/14/2018   Admitting Diagnosis: <principal problem not specified>  Subjective:  Reason for Consultation:STROKE  Chief Complaint: dizziness  History of present illness: 62 y/o woman with h/o DM, HTN, CAD, CHF who presented with dizziness.  Patient states she had a previous episode last week. She states had vertigo and nausea and left hand numbness at 1500.  Symptoms have resolved. Emergent telestroke consult requested. CT head reviewed and case discussed with ED staff. On ASA and Plavix.   Review of Systems  Patient Active Problem List   Diagnosis Date Noted  . Ischemic cardiomyopathy 11/06/2017  . CAD, multiple vessel   . Pericardial effusion 09/07/2017  . AKI (acute kidney injury) ([place]) 09/07/2017  . Elevated troponin I level 09/07/2017  . Acute combined systolic (congestive) and diastolic (congestive) heart failure ([place]) 09/06/2017  . Cardiomegaly 09/05/2017  . Morbid obesity ([place]) 03/04/2013  . Labyrinthitis 03/04/2013  . Diabetes mellitus type 2 with complications, uncontrolled ([place]) 03/04/2013  . GASTRITIS 12/13/2006  . HIATAL HERNIA, HX OF 12/13/2006  . THYROIDECTOMY, HX OF 12/13/2006  . Hypothyroidism 10/04/2006  . OBESITY 10/04/2006  . TOBACCO ABUSE 10/04/2006  . CARPAL TUNNEL SYNDROME 10/04/2006  . Essential hypertension 10/04/2006  . GERD 10/04/2006  . POSTMENOPAUSAL STATUS 10/04/2006  . SKIN TAG 10/04/2006  . KNEE PAIN, LEFT 10/04/2006  . Sleep apnea 10/04/2006  . LEG EDEMA 10/04/2006   Past Medical History:  Diagnosis Date  . 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 Coronary artery disease   . Diabetes mellitus   . Dyspnea   . Glaucoma   . Hypertension   . Morbid obesity ([place])   . Obstructive sleep apnea    does not wear CPAP  .  Thyroid disease    Past Surgical History:  Procedure Laterality Date  . ABDOMINAL HYSTERECTOMY    . CARDIAC CATHETERIZATION  09/12/2017  . CORONARY STENT INTERVENTION  09/12/2017   STENT RESOLUTE ONYX 0.03B04 drug eluting stent was successfully placed  . CORONARY STENT INTERVENTION N/A 09/12/2017   Procedure: CORONARY STENT INTERVENTION;  Surgeon: Leonie Man, MD;  Location: [facility];  Service: Cardiovascular;  Laterality: N/A;  . LEFT HEART CATH AND CORONARY ANGIOGRAPHY N/A 09/12/2017   Procedure: LEFT HEART CATH AND CORONARY ANGIOGRAPHY;  Surgeon: Leonie Man, MD;  Location: [facility];  Service: Cardiovascular;  Laterality: N/A;  . RIGHT/LEFT HEART CATH AND CORONARY ANGIOGRAPHY N/A 09/10/2017   Procedure: RIGHT/LEFT HEART CATH AND CORONARY ANGIOGRAPHY;  Surgeon: Troy Sine, MD;  Location: [facility];  Service: Cardiovascular;  Laterality: N/A;  . THYROID SURGERY     Allergies  Allergen Reactions  . Aspirin Nausea Only    Social History   Socioeconomic History  . Marital status: Widowed    Spouse name: Not on file  . Number of children: Not on file  . Years of education: Not on file  . Highest education level: Not on file  Occupational History  . 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 Financial resource strain: Not on file  . Food insecurity:    Worry: Not on file    Inability: Not on file  . Transportation needs:  Medical: Not on file    Non-medical: Not on file  Tobacco Use  . Smoking status: Former Smoker    Last attempt to quit: 12/14/2005    Years since quitting: 12.2  . Smokeless tobacco: Never Used  . Tobacco comment: QUIT IN 2005  Substance and Sexual Activity  . Alcohol use: No  . Drug use: No  . Sexual activity: Never  Lifestyle  . Physical activity:    Days per week: Not on file    Minutes per session: Not on file  . Stress: Not on file  Relationships  . Social connections:     Talks on phone: Not on file    Gets together: Not on file    Attends religious service: Not on file    Active member of club or organization: Not on file    Attends meetings of clubs or organizations: Not on file    Relationship status: Not on file  . Intimate partner violence:    Fear of current or ex partner: Not on file    Emotionally abused: Not on file    Physically abused: Not on file    Forced sexual activity: Not on file  Other Topics Concern  . Not on file  Social History Narrative  . Not on file   Family History Family History  Problem Relation Age of Onset  . Diabetes Mother   . Hypertension Mother   . Cancer Mother        pancreas  . Hypertension Sister       Objective:  Vital signs in last 24 hours: Pulse Rate:  [88] 88 (04/04 1303) BP: (160)/(80) 160/80 (04/04 1303) SpO2:  [94 %] 94 % (04/04 1303) Weight:  [138.3 kg (305 lb)] 138.3 kg (305 lb) (04/04 1635) No data recorded.    Intake/Output last 3 shifts: No intake/output data recorded.  Intake/Output this shift: No intake/output data recorded.  Physical Exam 1a- LOC: Keenly responsive - 0      1b- LOC questions: Answers both questions correctly - 0     1c- LOC commands- Performs both tasks correctly- 0     2- Gaze: Normal; no gaze paresis or gaze deviation - 0     3- Visual Fields: normal, no Visual field deficit - 0     4- Facial movements: no facial palsy - 0     5a- Right Upper limb motor - no drift -0     5b -Left Upper limb motor - no drift -0     6a- Right Lower limb motor - no drift - 0      6b- Left Lower limb motor - no drift - 0 7- Limb Coordination: absent ataxia - 0      8- Sensory : no sensory loss - 0      9- Language - No aphasia - 0      10- Speech - No dysarthria -0     11- Neglect / Extinction - none found -0     NIHSS score   0  Diagnostic Findings:  Pertinent Labs: No results for input(s): WBC, MCV in the last 168 hours.  Invalid input(s): HEMATOCRIT, HEMOGLOBIN,  PLATELETS No results for input(s): POTASSIUM, CHLORIDE, CALCIUM, BUN, GLUCOSE, CREATININE, CO2 in the last 168 hours.  Invalid input(s): SODIUM, ANION GAP2, GFR MDRD AF AMER No results for input(s): AST, ALT, ALBUMIN in the last 168 hours.  Invalid input(s): ALK PHOS, BILIRUBIN TOTAL, BILIRUBIN DIRECT, PROTEIN TOTAL, GLOBULIN No results  for input(s): TRIG in the last 168 hours.  Invalid input(s): CHLPL, HDL PML, VLDL CALC, LDL CALC, CHOL/HDL RATIO, LDL/HDL RATIO, NON-HDL CHOLESTEROL Invalid input(s): CK TOTAL, TROPONIN I, CK MB No results for input(s): APTT in the last 168 hours. No results for input(s): INR in the last 168 hours.  Invalid input(s): PROTHROMBIN TIME  Extensive review of previous medical records completed.    Assessment: Patient Active Problem List   Diagnosis Date Noted  . Ischemic cardiomyopathy 11/06/2017  . CAD, multiple vessel   . Pericardial effusion 09/07/2017  . AKI (acute kidney injury) ([place]) 09/07/2017  . Elevated troponin I level 09/07/2017  . Acute combined systolic (congestive) and diastolic (congestive) heart failure ([place]) 09/06/2017  . Cardiomegaly 09/05/2017  . Morbid obesity ([place]) 03/04/2013  . Labyrinthitis 03/04/2013  . Diabetes mellitus type 2 with complications, uncontrolled ([place]) 03/04/2013  . GASTRITIS 12/13/2006  . HIATAL HERNIA, HX OF 12/13/2006  . THYROIDECTOMY, HX OF 12/13/2006  . Hypothyroidism 10/04/2006  . OBESITY 10/04/2006  . TOBACCO ABUSE 10/04/2006  . CARPAL TUNNEL SYNDROME 10/04/2006  . Essential hypertension 10/04/2006  . GERD 10/04/2006  . POSTMENOPAUSAL STATUS 10/04/2006  . SKIN TAG 10/04/2006  . KNEE PAIN, LEFT 10/04/2006  . Sleep apnea 10/04/2006  . LEG EDEMA 10/04/2006     Plan: [facility] Tele[facility]urology Consult Services  Impression: TIA     Differential Diagnosis:  1. Cardioembolic stroke  2. Small vessel disease/lacune    3. Thromboembolic, artery-to-artery mechanism 4. Hypercoagulable  state-related infarct  5. Thrombotic mechanism, large artery disease 6. Transient ischemic attack 7. Peripheral vertigo  Comments: Last known well:   1500 Door time:1635 [facility] contacted:   1834 [facility] at bedside:   1649 NIHSS assessment time:1653 CT head reviewed at 1653 Needle time:TPA not given since NIHSS 0 Thrombectomy not considered since large vessel occlusion is not suspected     Discussion:     Our recommendations are outlined below.  Recommendations: ASA, Plavix IV fluids and permissive hypertension (Keep SBP < 220/110) Neurochecks DVT prophylaxis    Follow up with Neurology for further testing and evaluation  Medical Decision Making:  - Extensive number of diagnosis or management options are considered above. - Extensive amount of complex data reviewed.  - High risk of complication and/or morbidity or mortality are associated with differential diagnostic considerations above. - There may be uncertain outcome and increased probability of prolonged functional impairment or high probability of severe prolonged functional impairment associated with some of these differential diagnosis.  Medical Data Reviewed: 1.Data reviewed include clinical labs, radiology, Medical Tests; 2.Tests results discussed w/performing or interpreting physician;    3.Obtaining/reviewing old medical records; 4.Obtaining case history from another source;    5.Independent review of image, tracing or specimen.  Signed: @MECRED @   03/14/2018  4:51 PM

## 2018-03-14 NOTE — ED Notes (Signed)
Patient delayed in CT due to anxiousness-patient now being assessed by tele-neurologist.

## 2018-03-14 NOTE — ED Notes (Addendum)
Patient back from CT. Clarification made in regards to patient's dizziness and symptom onset: patient had intermittent dizziness on Tuesday, seen cardiologist today for routine follow-up and told him about dizziness in which she was given prescription for meclizine. Patient started having dizziness and numbness in left hand when she returned home.

## 2018-03-15 ENCOUNTER — Other Ambulatory Visit: Payer: Self-pay

## 2018-03-15 DIAGNOSIS — I5042 Chronic combined systolic (congestive) and diastolic (congestive) heart failure: Secondary | ICD-10-CM | POA: Diagnosis not present

## 2018-03-15 DIAGNOSIS — E1165 Type 2 diabetes mellitus with hyperglycemia: Secondary | ICD-10-CM | POA: Diagnosis not present

## 2018-03-15 DIAGNOSIS — R42 Dizziness and giddiness: Secondary | ICD-10-CM | POA: Diagnosis not present

## 2018-03-15 DIAGNOSIS — E038 Other specified hypothyroidism: Secondary | ICD-10-CM

## 2018-03-15 DIAGNOSIS — I10 Essential (primary) hypertension: Secondary | ICD-10-CM

## 2018-03-15 DIAGNOSIS — E118 Type 2 diabetes mellitus with unspecified complications: Secondary | ICD-10-CM

## 2018-03-15 DIAGNOSIS — G463 Brain stem stroke syndrome: Secondary | ICD-10-CM | POA: Diagnosis not present

## 2018-03-15 DIAGNOSIS — R270 Ataxia, unspecified: Secondary | ICD-10-CM | POA: Diagnosis not present

## 2018-03-15 LAB — GLUCOSE, CAPILLARY
GLUCOSE-CAPILLARY: 225 mg/dL — AB (ref 65–99)
Glucose-Capillary: 214 mg/dL — ABNORMAL HIGH (ref 65–99)

## 2018-03-15 LAB — LIPID PANEL
CHOLESTEROL: 144 mg/dL (ref 0–200)
HDL: 32 mg/dL — AB (ref >40)
LDL CALC: 80 mg/dL (ref 0–99)
TRIGLYCERIDES: 160 mg/dL — AB (ref <150)
Total CHOL/HDL Ratio: 4.5 RATIO
VLDL: 32 mg/dL (ref 0–40)

## 2018-03-15 LAB — HEMOGLOBIN A1C
HEMOGLOBIN A1C: 9.3 % — AB (ref 4.8–5.6)
MEAN PLASMA GLUCOSE: 220.21 mg/dL

## 2018-03-15 MED ORDER — ENOXAPARIN SODIUM 80 MG/0.8ML ~~LOC~~ SOLN: 70.0000 mg | SUBCUTANEOUS | Status: DC

## 2018-03-15 MED ORDER — ASPIRIN 325 MG PO TABS: 325.0000 mg | ORAL_TABLET | Freq: Every day | ORAL | Status: DC

## 2018-03-15 MED ORDER — ASPIRIN EC 325 MG PO TBEC: 325.0000 mg | DELAYED_RELEASE_TABLET | Freq: Every day | ORAL | 1 refills | Status: DC

## 2018-03-15 NOTE — Plan of Care (Signed)
  Problem: Acute Rehab PT Goals(only PT should resolve) Goal: Patient Will Transfer Sit To/From Stand Outcome: Progressing Flowsheets (Taken 03/15/2018 1133) Patient will transfer sit to/from stand: with supervision Goal: Pt Will Transfer Bed To Chair/Chair To Bed Outcome: Progressing Flowsheets (Taken 03/15/2018 1133) Pt will Transfer Bed to Chair/Chair to Bed: with supervision Goal: Pt Will Ambulate Outcome: Progressing Flowsheets (Taken 03/15/2018 1133) Pt will Ambulate: with supervision;50 feet;with rolling walker   11:34 AM, 03/15/18 Lonell Grandchild, MPT Physical Therapist with [facility] [phone] office [phone] mobile phone

## 2018-03-15 NOTE — Consult Note (Signed)
Alpha A. Merlene Laughter, MD     [email or web address]          Maureen Jackson is an 62 y.o. female.   ASSESSMENT/PLAN: 1. Acute vertiginous symptoms in a female with multiple risk factors for ischemic stroke. I suspect that the patient most likely has had a small vessel ischemic event/stroke not seen on CT. We did suggest MRI imaging but she categorically refuses. Her symptoms have improved and therefore it is okay for her to be discharged. I will recommend that she be placed on a full dose aspirin along with Plavix dual antiplatelet agent.Other risk factor reductions include use of CPAP machine, weight reduction, diabetes control and control her blood pressure.     Patient is a 62 year old female who presents with the acute onset of vertiginous symptoms started on Tuesday of this week. She's had at least 2 episodes lasting for an hour associated with this spinning like sensation. She reports significant nausea but no vomiting. She reported gait instability. She does not report diplopia, dysarthria, dysphagia hemiparesis or hemisensory symptoms. She does not have chest pain or shortness of breath with this current symptoms. She has a long-standing history of coronary artery disease and has had stents placed in October 2018. She has been maintained on dual antiplatelet agents and tells me she has been compliant with this. She was given meclizine and observing the hospital. She reports her symptoms have improved although she still has persistent nausea and a little unsteady when she walks around. Review systems otherwise negative.   GENERAL: This is a pleasant morbidly obese female in no acute distress.  HEENT: She has a large neck and a large tongue with crowded posterior space. There is a lump right frontal region with some bony changes. Seems most consistent with a lipoma. She tells me she's had this for many years.  ABDOMEN: soft  EXTREMITIES: No edema   BACK:  normal  SKIN: Normal by inspection.    MENTAL STATUS: Alert and oriented. Speech, language and cognition are generally intact. Judgment and insight normal.   CRANIAL NERVES: Pupils are equal, round and reactive to light and accomodation; extra ocular movements are full, there is no significant nystagmus; visual fields are full; upper and lower facial muscles are normal in strength and symmetric, there is no flattening of the nasolabial folds; tongue is midline; uvula is midline; shoulder elevation is normal.  MOTOR: Normal tone, bulk and strength; no pronator drift. No upper or lower extremity drift is appreciated.  COORDINATION: Left finger to nose is normal, right finger to nose is normal, No rest tremor; no intention tremor; no postural tremor; no bradykinesia.  REFLEXES: Deep tendon reflexes are symmetrical and normal. Plantar reflexes are flexor bilaterally.   SENSATION: Normal to light touch, temperature, and pain. No extinction appreciated.    NIH stroke scale 0.   Blood pressure 128/61, pulse 75, temperature 98.8 F (37.1 C), temperature source Oral, resp. rate 18, height 5' 7" (1.702 m), weight (!) 303 lb 2.1 oz (137.5 kg), SpO2 97 %.  Past Medical History:  Diagnosis Date  . 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 Coronary artery disease 09/2017   stents placed  . Diabetes mellitus   . Dyspnea   . Glaucoma   . Hypertension   . Morbid obesity ([place])   . Obstructive sleep apnea    does not wear CPAP  . Thyroid disease     Past Surgical History:  Procedure Laterality Date  .  ABDOMINAL HYSTERECTOMY    . CARDIAC CATHETERIZATION  09/12/2017  . CORONARY STENT INTERVENTION  09/12/2017   STENT RESOLUTE ONYX 0.63K16 drug eluting stent was successfully placed  . CORONARY STENT INTERVENTION N/A 09/12/2017   Procedure: CORONARY STENT INTERVENTION;  Surgeon: Leonie Man, MD;  Location: [facility];  Service: Cardiovascular;  Laterality: N/A;  . LEFT HEART CATH AND  CORONARY ANGIOGRAPHY N/A 09/12/2017   Procedure: LEFT HEART CATH AND CORONARY ANGIOGRAPHY;  Surgeon: Leonie Man, MD;  Location: [facility];  Service: Cardiovascular;  Laterality: N/A;  . RIGHT/LEFT HEART CATH AND CORONARY ANGIOGRAPHY N/A 09/10/2017   Procedure: RIGHT/LEFT HEART CATH AND CORONARY ANGIOGRAPHY;  Surgeon: Troy Sine, MD;  Location: [facility];  Service: Cardiovascular;  Laterality: N/A;  . THYROID SURGERY      Family History  Problem Relation Age of Onset  . Diabetes Mother   . Hypertension Mother   . Cancer Mother        pancreas  . Hypertension Sister     Social History:  reports that she quit smoking about 12 years ago. She has never used smokeless tobacco. She reports that she does not drink alcohol or use drugs.  Allergies:  Allergies  Allergen Reactions  . Aspirin Nausea Only    Medications: Prior to Admission medications   Medication Sig Start Date End Date Taking? Authorizing Provider  amLODipine (NORVASC) 5 MG tablet Take 1 tablet (5 mg total) by mouth daily. 11/07/17  Yes [name], PA-C  aspirin 81 MG chewable tablet Chew 1 tablet (81 mg total) by mouth daily. 09/14/17  Yes Ghimire, Henreitta Leber, MD  atorvastatin (LIPITOR) 80 MG tablet Take 1 tablet (80 mg total) by mouth daily at 6 PM. 11/14/17  Yes [name], PA-C  clopidogrel (PLAVIX) 75 MG tablet Take 1 tablet (75 mg total) by mouth daily with breakfast. 11/07/17  Yes [name], PA-C  furosemide (LASIX) 40 MG tablet Take 1 tablet (40 mg total) by mouth daily. 09/14/17  Yes Ghimire, Henreitta Leber, MD  isosorbide mononitrate (IMDUR) 30 MG 24 hr tablet Take 1 tablet (30 mg total) by mouth daily. 11/07/17  Yes [name], PA-C  levothyroxine (SYNTHROID, LEVOTHROID) 200 MCG tablet TK 1 T PO QD 10/15/17  Yes [provider]  meclizine (ANTIVERT) 25 MG tablet Take 1 tablet (25 mg total) by mouth 3 (three) times daily as needed for dizziness. 03/14/18  Yes Arnoldo Lenis, MD  metFORMIN (GLUCOPHAGE) 1000 MG tablet Take 1 tablet (1,000 mg total) by mouth 2 (two) times daily with a meal. 09/14/17  Yes Ghimire, Henreitta Leber, MD  [facility] VERIO test strip USE TO TEST BLOOD SUGAR BID 01/05/18  Yes [provider]  sacubitril-valsartan (ENTRESTO) 97-103 MG Take 1 tablet by mouth 2 (two) times daily. 03/14/18  Yes Branch, Alphonse Guild, MD  carvedilol (COREG) 25 MG tablet Take 1 tablet (25 mg total) by mouth 2 (two) times daily. 11/07/17 02/11/18  [name], PA-C  VENTOLIN HFA 108 (90 Base) MCG/ACT inhaler Inhale 1-2 puffs into the lungs every 6 (six) hours as needed for wheezing or shortness of breath. Patient taking differently: Inhale 1-2 puffs into the lungs as needed for wheezing or shortness of breath.  09/14/17   Ghimire, Henreitta Leber, MD    Scheduled Meds: . aspirin  81 mg Oral Daily  . atorvastatin  80 mg Oral q1800  . clopidogrel  75 mg Oral Q  breakfast  . enoxaparin (LOVENOX) injection  70 mg Subcutaneous Q24H  . furosemide  40 mg Oral Daily  . insulin aspart  0-20 Units Subcutaneous TID WC  . insulin aspart  0-5 Units Subcutaneous QHS  . isosorbide mononitrate  30 mg Oral Daily  . levothyroxine  200 mcg Oral QAC breakfast   Continuous Infusions: . sodium chloride 50 mL/hr at 03/14/18 2137   PRN Meds:.acetaminophen **OR** acetaminophen (TYLENOL) oral liquid 160 mg/5 mL **OR** acetaminophen, albuterol, meclizine, ondansetron (ZOFRAN) IV, senna-docusate     Results for orders placed or performed during the hospital encounter of 03/14/18 (from the past 48 hour(s))  Urine rapid drug screen (hosp performed)not at [facility]     Status: None   Collection Time: 03/14/18  4:45 PM  Result Value Ref Range   Opiates NONE DETECTED NONE DETECTED   Cocaine NONE DETECTED NONE DETECTED   Benzodiazepines NONE DETECTED NONE DETECTED   Amphetamines NONE DETECTED NONE DETECTED   Tetrahydrocannabinol NONE DETECTED NONE DETECTED   Barbiturates NONE DETECTED  NONE DETECTED    Comment: (NOTE) DRUG SCREEN FOR MEDICAL PURPOSES ONLY.  IF CONFIRMATION IS NEEDED FOR ANY PURPOSE, NOTIFY LAB WITHIN 5 DAYS. LOWEST DETECTABLE LIMITS FOR URINE DRUG SCREEN Drug Class                     Cutoff (ng/mL) Amphetamine and metabolites    1000 Barbiturate and metabolites    200 Benzodiazepine                 542 Tricyclics and metabolites     300 Opiates and metabolites        300 Cocaine and metabolites        300 THC                            50 Performed at [facility], [address], [place], [place] [address]   Urinalysis, Routine w reflex microscopic     Status: Abnormal   Collection Time: 03/14/18  4:45 PM  Result Value Ref Range   Color, Urine COLORLESS (A) YELLOW   APPearance CLEAR CLEAR   Specific Gravity, Urine 1.006 1.005 - 1.030   pH 8.0 5.0 - 8.0   Glucose, UA >=500 (A) NEGATIVE mg/dL   Hgb urine dipstick SMALL (A) NEGATIVE   Bilirubin Urine NEGATIVE NEGATIVE   Ketones, ur NEGATIVE NEGATIVE mg/dL   Protein, ur 30 (A) NEGATIVE mg/dL   Nitrite NEGATIVE NEGATIVE   Leukocytes, UA NEGATIVE NEGATIVE   RBC / HPF 0-5 0 - 5 RBC/hpf   WBC, UA 0-5 0 - 5 WBC/hpf   Bacteria, UA NONE SEEN NONE SEEN   Squamous Epithelial / LPF 0-5 (A) NONE SEEN    Comment: Performed at [facility], [address], [place], [place] [address]  Ethanol     Status: None   Collection Time: 03/14/18  5:05 PM  Result Value Ref Range   Alcohol, Ethyl (B) <10 <10 mg/dL    Comment:        LOWEST DETECTABLE LIMIT FOR SERUM ALCOHOL IS 10 mg/dL FOR MEDICAL PURPOSES ONLY Performed at [facility], [address], [place], [place] [address]   Protime-INR     Status: None   Collection Time: 03/14/18  5:05 PM  Result Value Ref Range   Prothrombin Time 12.5 11.4 - 15.2 seconds   INR 0.95     Comment: Performed at [facility],  [address], [place], [place] [address]  APTT     Status: None   Collection Time: 03/14/18  5:05 PM  Result Value Ref Range   aPTT 26 24 - 36  seconds    Comment: Performed at [facility], [address] [place], [place] [address]  CBC     Status: Abnormal   Collection Time: 03/14/18  5:05 PM  Result Value Ref Range   WBC 7.7 4.0 - 10.5 K/uL   RBC 4.89 3.87 - 5.11 MIL/uL   Hemoglobin 12.6 12.0 - 15.0 g/dL   HCT 39.0 36.0 - 46.0 %   MCV 79.8 78.0 - 100.0 fL   MCH 25.8 (L) 26.0 - 34.0 pg   MCHC 32.3 30.0 - 36.0 g/dL   RDW 13.5 11.5 - 15.5 %   Platelets 257 150 - 400 K/uL    Comment: Performed at [facility], [address], [place], [place] [address]  Differential     Status: None   Collection Time: 03/14/18  5:05 PM  Result Value Ref Range   Neutrophils Relative % 53 %   Neutro Abs 4.0 1.7 - 7.7 K/uL   Lymphocytes Relative 34 %   Lymphs Abs 2.6 0.7 - 4.0 K/uL   Monocytes Relative 5 %   Monocytes Absolute 0.4 0.1 - 1.0 K/uL   Eosinophils Relative 8 %   Eosinophils Absolute 0.6 0.0 - 0.7 K/uL   Basophils Relative 0 %   Basophils Absolute 0.0 0.0 - 0.1 K/uL    Comment: Performed at [facility], [address] [place], [place] [address]  Comprehensive metabolic panel     Status: Abnormal   Collection Time: 03/14/18  5:05 PM  Result Value Ref Range   Sodium 138 135 - 145 mmol/L   Potassium 4.1 3.5 - 5.1 mmol/L   Chloride 104 101 - 111 mmol/L   CO2 23 22 - 32 mmol/L   Glucose, Bld 267 (H) 65 - 99 mg/dL   BUN 24 (H) 6 - 20 mg/dL   Creatinine, Ser 0.89 0.44 - 1.00 mg/dL   Calcium 9.4 8.9 - 10.3 mg/dL   Total Protein 8.6 (H) 6.5 - 8.1 g/dL   Albumin 3.3 (L) 3.5 - 5.0 g/dL   AST 33 15 - 41 U/L   ALT 33 14 - 54 U/L   Alkaline Phosphatase 215 (H) 38 - 126 U/L   Total Bilirubin 0.8 0.3 - 1.2 mg/dL   GFR calc non Af Amer >60 >60 mL/min   GFR calc Af Amer >60 >60 mL/min    Comment: (NOTE) The eGFR has been calculated using the CKD EPI equation. This calculation has not been validated in all clinical situations. eGFR's persistently <60 mL/min signify possible Chronic Kidney Disease.    Anion gap 11 5 - 15    Comment:  Performed at [facility], [address], [place], [place] [address]  I-Stat Chem 8, ED  (not at [facility])     Status: Abnormal   Collection Time: 03/14/18  5:39 PM  Result Value Ref Range   Sodium 139 135 - 145 mmol/L   Potassium 3.9 3.5 - 5.1 mmol/L   Chloride 102 101 - 111 mmol/L   BUN 22 (H) 6 - 20 mg/dL   Creatinine, Ser 0.80 0.44 - 1.00 mg/dL   Glucose, Bld 258 (H) 65 - 99 mg/dL   Calcium, Ion 1.14 (L) 1.15 - 1.40 mmol/L   TCO2 24 22 - 32 mmol/L   Hemoglobin 12.9 12.0 -  15.0 g/dL   HCT 38.0 36.0 - 46.0 %  I-stat troponin, ED (not at [facility], [facility])     Status: None   Collection Time: 03/14/18  5:41 PM  Result Value Ref Range   Troponin i, poc 0.01 0.00 - 0.08 ng/mL   Comment 3            Comment: Due to the release kinetics of cTnI, a negative result within the first hours of the onset of symptoms does not rule out myocardial infarction with certainty. If myocardial infarction is still suspected, repeat the test at appropriate intervals.   Hemoglobin A1c     Status: Abnormal   Collection Time: 03/15/18  6:43 AM  Result Value Ref Range   Hgb A1c MFr Bld 9.3 (H) 4.8 - 5.6 %    Comment: (NOTE) Pre diabetes:          5.7%-6.4% Diabetes:              >6.4% Glycemic control for   <7.0% adults with diabetes    Mean Plasma Glucose 220.21 mg/dL    Comment: Performed at [facility] [address], [place], [place] [address]  Lipid panel     Status: Abnormal   Collection Time: 03/15/18  6:43 AM  Result Value Ref Range   Cholesterol 144 0 - 200 mg/dL   Triglycerides 160 (H) <150 mg/dL   HDL 32 (L) >40 mg/dL   Total CHOL/HDL Ratio 4.5 RATIO   VLDL 32 0 - 40 mg/dL   LDL Cholesterol 80 0 - 99 mg/dL    Comment:        Total Cholesterol/HDL:CHD Risk Coronary Heart Disease Risk Table                     Men   Women  1/2 Average Risk   3.4   3.3  Average Risk       5.0   4.4  2 X Average Risk   9.6   7.1  3 X Average Risk  23.4   11.0        Use the calculated Patient  Ratio above and the CHD Risk Table to determine the patient's CHD Risk.        ATP III CLASSIFICATION (LDL):  <100     mg/dL   Optimal  100-129  mg/dL   Near or Above                    Optimal  130-159  mg/dL   Borderline  160-189  mg/dL   High  >190     mg/dL   Very High Performed at [facility], [address], [place], [place] [address]   Glucose, capillary     Status: Abnormal   Collection Time: 03/15/18  7:27 AM  Result Value Ref Range   Glucose-Capillary 214 (H) 65 - 99 mg/dL   Comment 1 Notify RN    Comment 2 Document in Chart   Glucose, capillary     Status: Abnormal   Collection Time: 03/15/18 11:59 AM  Result Value Ref Range   Glucose-Capillary 225 (H) 65 - 99 mg/dL   Comment 1 Notify RN    Comment 2 Document in Chart     Studies/Results:  HEAD NECK CTA FINDINGS: CTA NECK FINDINGS  AORTIC ARCH: There is no calcific atherosclerosis of the aortic arch. There is no aneurysm, dissection or hemodynamically significant stenosis of the visualized ascending aorta and  aortic arch. Conventional 3 vessel aortic branching pattern. The visualized proximal subclavian arteries are widely patent.  RIGHT CAROTID SYSTEM:  --Common carotid artery: Widely patent origin without common carotid artery dissection or aneurysm.  --Internal carotid artery: No dissection, occlusion or aneurysm. No hemodynamically significant stenosis.  --External carotid artery: No acute abnormality.  LEFT CAROTID SYSTEM:  --Common carotid artery: Widely patent origin without common carotid artery dissection or aneurysm.  --Internal carotid artery:No dissection, occlusion or aneurysm. No hemodynamically significant stenosis.  --External carotid artery: No acute abnormality.  VERTEBRAL ARTERIES: Left dominant configuration. Both origins are normal. No dissection, occlusion or flow-limiting stenosis to the vertebrobasilar confluence.  SKELETON: There is no bony spinal canal  stenosis. No lytic or blastic lesion.  OTHER NECK: Normal pharynx, larynx and major salivary glands. No cervical lymphadenopathy. Unremarkable thyroid gland.  UPPER CHEST: No pneumothorax or pleural effusion. No nodules or masses.  CTA HEAD FINDINGS  ANTERIOR CIRCULATION:  --Intracranial internal carotid arteries: Normal.  --Anterior cerebral arteries: Normal. Both A1 segments are present. Patent anterior communicating artery.  --Middle cerebral arteries: Normal.  --Posterior communicating arteries: Present on the left, absent on the right.  POSTERIOR CIRCULATION:  --Basilar artery: Normal.  --Posterior cerebral arteries: Normal.  --Superior cerebellar arteries: Normal.  --Inferior cerebellar arteries: Normal anterior and posterior inferior cerebellar arteries.  VENOUS SINUSES: As permitted by contrast timing, patent.  ANATOMIC VARIANTS: None  DELAYED PHASE: No parenchymal contrast enhancement.  Review of the MIP images confirms the above findings.  IMPRESSION: No aneurysm, occlusion or stenosis of the arteries of the head and Neck.       HEAD CT FINDINGS: Brain: Cerebral volume is within normal limits for age. No midline shift, ventriculomegaly, mass effect, evidence of mass lesion, intracranial hemorrhage or evidence of cortically based acute infarction.  No cortical encephalomalacia. Mild for age cerebral white matter hypodensity, primarily limited to the left periatrial white matter. Partially empty sella.  Vascular: Calcified atherosclerosis at the skull base. No suspicious intracranial vascular hyperdensity.  Skull: Negative.  Sinuses/Orbits: Clear aside from mucous retention cysts in the right sphenoid sinus.  Other: Right forehead benign scalp lipoma incidentally noted (series 3, image 12). No acute orbit or scalp soft tissue findings.  ASPECTS [facility] Stroke Program Early CT Score)  - Ganglionic level  infarction (caudate, lentiform nuclei, internal capsule, insula, M1-M3 cortex): 7  - Supraganglionic infarction (M4-M6 cortex): 3  Total score (0-10 with 10 being normal): 10  IMPRESSION: 1. No acute cortically based infarct or acute intracranial hemorrhage identified. ASPECTS is 10. 2. Mild for age nonspecific white matter disease, most commonly due to chronic small vessel disease.          Kofi A. Merlene Laughter, M.D.  Diplomate, Tax adviser of Psychiatry and Neurology ( Neurology). 03/15/2018, 2:58 PM

## 2018-03-15 NOTE — Discharge Summary (Addendum)
Physician Discharge Summary  SHAYRA ANTON BJY:[id number] DOB: October 01, 1956 DOA: 03/14/2018  PCP: Neale Burly, MD  Admit date: 03/14/2018 Discharge date: 03/15/2018  Time spent: 35 minutes  Recommendations for Outpatient Follow-up:  1. Repeat BMET to follow electrolytes and renal function  2. Reassess resolution of dizziness and improvement of vertigo/nausea.   Discharge Diagnoses:  Principal Problem:   Vertigo Active Problems:   Hypothyroidism   Essential hypertension   Sleep apnea   Morbid obesity ([place])   Diabetes mellitus type 2 with complications, uncontrolled (H[facility])   Chronic combined systolic and diastolic CHF (congestive heart failure) ([place])   Discharge Condition: Stable and improved. No CP, no SOB; patient would go home with [facility] services for vestibular training and instructions to follow up with PCP in 10 days.   Diet recommendation: heart healthy, modified carbohydrates and low calorie diet.  Filed Weights   03/14/18 1635 03/14/18 2029  Weight: (!) 138.3 kg (305 lb) (!) 137.5 kg (303 lb 2.1 oz)    History of present illness:  As per H&P written by Dr. Lorin Mercy on 03/14/18 62 y.o. female with medical history significant of morbid obesity, hypothyroidism, chronic combined CHF (EF 30-35% with grade 1 diastolic dysfunction in 0/86), OSA not on CPAP, HTN, DM, and glaucoma presenting with dizziness.   Tuesday, she developed dizziness for about an hour.  She saw her heart doctor today and told him about it.  He gave her an rx for meclizine.  When she got home, the room started spinning.  She was scared to stand up to get the pills.  She describes the dizziness as the room going fast, feeling sick on her stomach, and her hands itching.  Dr. Harl Bowie thought it was vertigo and she does have a h/o this about 2 years ago.  She was given Meclizine in the ER and "it ain't fast as it was."  However, "it looks like the hand sanitizer is going up into the ceiling"  Hospital Course:   1-Dizziness/vertigo/nausea: -Images and studies demonstrated no acute stroke -Patient presentation and history compatible with medical -Physical therapy has examined the patient and recommended short-term rehabilitation in a skilled nursing facility, which is something the patient has declined. -Patient will be discharged with home health services for physical therapy and also PRN meclizine to assist control in her particular symptoms. -Patient discharged home with instruction to follow-up with PCP in 10 days. -following neurology recommendations, would discharge on full dose aspirin and continue plavix.  2-hypertension -Overall is stable to slightly elevated. -Resume home antihypertensive regimen -Patient advised to follow low-sodium diet.  3-hyperlipidemia -Continue statins.  4-chronic combined diastolic heart failure -Compensated -Advise to follow low-sodium diet and to check it weight on daily basis. -Continue current diuretics regimen and also the use of beta-blocker, Imdur and Entresto  -continue outpatient follow up with cardiology service   5-morbid obesity -Body mass index is 47.48 kg/m. -low calorie diet and increase physical exercise discussed with patient.  6-OSA -continue outpatient follow up -not currently on CPAP  7-type 2 diabetes -resume home hypoglycemic regimen and follow low carb diet.  8-hypothyroidism  -continue synthroid   Procedures:  See below for x-ray reports   Consultations:  Neurology   Discharge Exam: Vitals:   03/15/18 0830 03/15/18 1230  BP: 140/62 128/61  Pulse: 74 75  Resp: 18 18  Temp: 99 F (37.2 C) 98.8 F (37.1 C)  SpO2: 98% 97%    General: Afebrile, denies chest pain and SOB, no palpitations,  no vomiting. Patient reported still feeling slightly dizzy and with some nausea.  Cardiovascular: S1 and S2, no rubs, no gallops Respiratory: CTA bilaterally Abd: obese, NT, ND, positive BS Extremities: no cyanosis, no  clubbing, MS 5/5   Discharge Instructions   Discharge Instructions    (HEART FAILURE PATIENTS) Call MD:  Anytime you have any of the following symptoms: 1) 3 pound weight gain in 24 hours or 5 pounds in 1 week 2) shortness of breath, with or without a dry hacking cough 3) swelling in the hands, feet or stomach 4) if you have to sleep on extra pillows at night in order to breathe.   Complete by:  As directed    Diet - low sodium heart healthy   Complete by:  As directed    Discharge instructions   Complete by:  As directed    Low-sodium diet and check your weight on daily basis Keep yourself adequately hydrated Take medications as prescribed (especially the use of meclizine 25 mg every morning hour as needed for dizziness). Follow recommendations by home health physical therapy regarding vestibular training and conditioning. Arrange follow-up with PCP in 10 days.     Allergies as of 03/15/2018      Reactions   Aspirin Nausea Only      Medication List    STOP taking these medications   aspirin 81 MG chewable tablet Replaced by:  aspirin EC 325 MG tablet     TAKE these medications   amLODipine 5 MG tablet Commonly known as:  NORVASC Take 1 tablet (5 mg total) by mouth daily.   aspirin EC 325 MG tablet Take 1 tablet (325 mg total) by mouth daily. Replaces:  aspirin 81 MG chewable tablet   atorvastatin 80 MG tablet Commonly known as:  LIPITOR Take 1 tablet (80 mg total) by mouth daily at 6 PM.   carvedilol 25 MG tablet Commonly known as:  COREG Take 1 tablet (25 mg total) by mouth 2 (two) times daily.   clopidogrel 75 MG tablet Commonly known as:  PLAVIX Take 1 tablet (75 mg total) by mouth daily with breakfast.   furosemide 40 MG tablet Commonly known as:  LASIX Take 1 tablet (40 mg total) by mouth daily.   isosorbide mononitrate 30 MG 24 hr tablet Commonly known as:  IMDUR Take 1 tablet (30 mg total) by mouth daily.   levothyroxine 200 MCG tablet Commonly  known as:  SYNTHROID, LEVOTHROID TK 1 T PO QD   meclizine 25 MG tablet Commonly known as:  ANTIVERT Take 1 tablet (25 mg total) by mouth 3 (three) times daily as needed for dizziness.   metFORMIN 1000 MG tablet Commonly known as:  GLUCOPHAGE Take 1 tablet (1,000 mg total) by mouth 2 (two) times daily with a meal.   ONETOUCH VERIO test strip Generic drug:  glucose blood USE TO TEST BLOOD SUGAR BID   sacubitril-valsartan 97-103 MG Commonly known as:  ENTRESTO Take 1 tablet by mouth 2 (two) times daily.   VENTOLIN HFA 108 (90 Base) MCG/ACT inhaler Generic drug:  albuterol Inhale 1-2 puffs into the lungs every 6 (six) hours as needed for wheezing or shortness of breath. What changed:  when to take this      Allergies  Allergen Reactions  . Aspirin Nausea Only   Follow-up Information    [facility].   Specialty:  Home Health Services Contact information: [address] [place] [address] [phone]  Neale Burly, MD. Schedule an appointment as soon as possible for a visit in 10 day(s).   Specialty:  Internal Medicine Contact information: [place] [address] 191 [phone]        Arnoldo Lenis, MD .   Specialty:  Cardiology Contact information: [address] [place] [address] [phone]          The results of significant diagnostics from this hospitalization (including imaging, microbiology, ancillary and laboratory) are listed below for reference.    Significant Diagnostic Studies: Ct Angio Head W Or Wo Contrast  Result Date: 03/14/2018 CLINICAL DATA:  Dizziness EXAM: CT ANGIOGRAPHY HEAD AND NECK TECHNIQUE: Multidetector CT imaging of the head and neck was performed using the standard protocol during bolus administration of intravenous contrast. Multiplanar CT image reconstructions and MIPs were obtained to evaluate the vascular anatomy. Carotid stenosis measurements (when  applicable) are obtained utilizing NASCET criteria, using the distal internal carotid diameter as the denominator. CONTRAST:  164mL ISOVUE-370 IOPAMIDOL (ISOVUE-370) INJECTION 76% COMPARISON:  03/14/2018 FINDINGS: CTA NECK FINDINGS AORTIC ARCH: There is no calcific atherosclerosis of the aortic arch. There is no aneurysm, dissection or hemodynamically significant stenosis of the visualized ascending aorta and aortic arch. Conventional 3 vessel aortic branching pattern. The visualized proximal subclavian arteries are widely patent. RIGHT CAROTID SYSTEM: --Common carotid artery: Widely patent origin without common carotid artery dissection or aneurysm. --Internal carotid artery: No dissection, occlusion or aneurysm. No hemodynamically significant stenosis. --External carotid artery: No acute abnormality. LEFT CAROTID SYSTEM: --Common carotid artery: Widely patent origin without common carotid artery dissection or aneurysm. --Internal carotid artery:No dissection, occlusion or aneurysm. No hemodynamically significant stenosis. --External carotid artery: No acute abnormality. VERTEBRAL ARTERIES: Left dominant configuration. Both origins are normal. No dissection, occlusion or flow-limiting stenosis to the vertebrobasilar confluence. SKELETON: There is no bony spinal canal stenosis. No lytic or blastic lesion. OTHER NECK: Normal pharynx, larynx and major salivary glands. No cervical lymphadenopathy. Unremarkable thyroid gland. UPPER CHEST: No pneumothorax or pleural effusion. No nodules or masses. CTA HEAD FINDINGS ANTERIOR CIRCULATION: --Intracranial internal carotid arteries: Normal. --Anterior cerebral arteries: Normal. Both A1 segments are present. Patent anterior communicating artery. --Middle cerebral arteries: Normal. --Posterior communicating arteries: Present on the left, absent on the right. POSTERIOR CIRCULATION: --Basilar artery: Normal. --Posterior cerebral arteries: Normal. --Superior cerebellar arteries:  Normal. --Inferior cerebellar arteries: Normal anterior and posterior inferior cerebellar arteries. VENOUS SINUSES: As permitted by contrast timing, patent. ANATOMIC VARIANTS: None DELAYED PHASE: No parenchymal contrast enhancement. Review of the MIP images confirms the above findings. IMPRESSION: No aneurysm, occlusion or stenosis of the arteries of the head and neck. Electronically Signed   By: Ulyses Jarred M.D.   On: 03/14/2018 22:17   Ct Angio Neck W Or Wo Contrast  Result Date: 03/14/2018 CLINICAL DATA:  Dizziness EXAM: CT ANGIOGRAPHY HEAD AND NECK TECHNIQUE: Multidetector CT imaging of the head and neck was performed using the standard protocol during bolus administration of intravenous contrast. Multiplanar CT image reconstructions and MIPs were obtained to evaluate the vascular anatomy. Carotid stenosis measurements (when applicable) are obtained utilizing NASCET criteria, using the distal internal carotid diameter as the denominator. CONTRAST:  187mL ISOVUE-370 IOPAMIDOL (ISOVUE-370) INJECTION 76% COMPARISON:  03/14/2018 FINDINGS: CTA NECK FINDINGS AORTIC ARCH: There is no calcific atherosclerosis of the aortic arch. There is no aneurysm, dissection or hemodynamically significant stenosis of the visualized ascending aorta and aortic arch. Conventional 3 vessel aortic branching pattern. The visualized proximal subclavian arteries are widely  patent. RIGHT CAROTID SYSTEM: --Common carotid artery: Widely patent origin without common carotid artery dissection or aneurysm. --Internal carotid artery: No dissection, occlusion or aneurysm. No hemodynamically significant stenosis. --External carotid artery: No acute abnormality. LEFT CAROTID SYSTEM: --Common carotid artery: Widely patent origin without common carotid artery dissection or aneurysm. --Internal carotid artery:No dissection, occlusion or aneurysm. No hemodynamically significant stenosis. --External carotid artery: No acute abnormality. VERTEBRAL  ARTERIES: Left dominant configuration. Both origins are normal. No dissection, occlusion or flow-limiting stenosis to the vertebrobasilar confluence. SKELETON: There is no bony spinal canal stenosis. No lytic or blastic lesion. OTHER NECK: Normal pharynx, larynx and major salivary glands. No cervical lymphadenopathy. Unremarkable thyroid gland. UPPER CHEST: No pneumothorax or pleural effusion. No nodules or masses. CTA HEAD FINDINGS ANTERIOR CIRCULATION: --Intracranial internal carotid arteries: Normal. --Anterior cerebral arteries: Normal. Both A1 segments are present. Patent anterior communicating artery. --Middle cerebral arteries: Normal. --Posterior communicating arteries: Present on the left, absent on the right. POSTERIOR CIRCULATION: --Basilar artery: Normal. --Posterior cerebral arteries: Normal. --Superior cerebellar arteries: Normal. --Inferior cerebellar arteries: Normal anterior and posterior inferior cerebellar arteries. VENOUS SINUSES: As permitted by contrast timing, patent. ANATOMIC VARIANTS: None DELAYED PHASE: No parenchymal contrast enhancement. Review of the MIP images confirms the above findings. IMPRESSION: No aneurysm, occlusion or stenosis of the arteries of the head and neck. Electronically Signed   By: Ulyses Jarred M.D.   On: 03/14/2018 22:17   Ct Head Code Stroke Wo Contrast  Result Date: 03/14/2018 CLINICAL DATA:  Code stroke. 62 year old female with dizziness and left hand numbness beginning at 1500 hours. EXAM: CT HEAD WITHOUT CONTRAST TECHNIQUE: Contiguous axial images were obtained from the base of the skull through the vertex without intravenous contrast. COMPARISON:  None. FINDINGS: Brain: Cerebral volume is within normal limits for age. No midline shift, ventriculomegaly, mass effect, evidence of mass lesion, intracranial hemorrhage or evidence of cortically based acute infarction. No cortical encephalomalacia. Mild for age cerebral white matter hypodensity, primarily limited  to the left periatrial white matter. Partially empty sella. Vascular: Calcified atherosclerosis at the skull base. No suspicious intracranial vascular hyperdensity. Skull: Negative. Sinuses/Orbits: Clear aside from mucous retention cysts in the right sphenoid sinus. Other: Right forehead benign scalp lipoma incidentally noted (series 3, image 12). No acute orbit or scalp soft tissue findings. ASPECTS [facility] Stroke Program Early CT Score) - Ganglionic level infarction (caudate, lentiform nuclei, internal capsule, insula, M1-M3 cortex): 7 - Supraganglionic infarction (M4-M6 cortex): 3 Total score (0-10 with 10 being normal): 10 IMPRESSION: 1. No acute cortically based infarct or acute intracranial hemorrhage identified. ASPECTS is 10. 2. Mild for age nonspecific white matter disease, most commonly due to chronic small vessel disease. 3. Study discussed by telephone with Dr. Francine Graven on 03/14/2018 at 16:58 . Electronically Signed   By: Genevie Ann M.D.   On: 03/14/2018 16:58   Labs: Basic Metabolic Panel: Recent Labs  Lab 03/14/18 1705 03/14/18 1739  NA 138 139  K 4.1 3.9  CL 104 102  CO2 23  --   GLUCOSE 267* 258*  BUN 24* 22*  CREATININE 0.89 0.80  CALCIUM 9.4  --    Liver Function Tests: Recent Labs  Lab 03/14/18 1705  AST 33  ALT 33  ALKPHOS 215*  BILITOT 0.8  PROT 8.6*  ALBUMIN 3.3*   CBC: Recent Labs  Lab 03/14/18 1705 03/14/18 1739  WBC 7.7  --   NEUTROABS 4.0  --   HGB 12.6 12.9  HCT 39.0 38.0  MCV 79.8  --  PLT 257  --     BNP (last 3 results) Recent Labs    09/05/17 2225  BNP 386.0*   CBG: Recent Labs  Lab 03/15/18 0727 03/15/18 1159  GLUCAP 214* 225*     Signed:  Barton Dubois MD.  [facility] 03/15/2018, 3:20 PM

## 2018-03-15 NOTE — Evaluation (Signed)
Physical Therapy Evaluation Patient Details Name: Maureen Jackson MRN: [id number] DOB: [birth year] Today's Date: 03/15/2018   History of Present Illness  DARCIA LAMPI is a 62 y.o. female with medical history significant of morbid obesity, hypothyroidism, chronic combined CHF (EF 30-35% with grade 1 diastolic dysfunction in 4/00), OSA not on CPAP, HTN, DM, and glaucoma presenting with dizziness.   Tuesday, she developed dizziness for about an hour.  She saw her heart doctor today and told him about it.  He gave her an rx for meclizine.  When she got home, the room started spinning.  She was scared to stand up to get the pills.  She describes the dizziness as the room going fast, feeling sick on her stomach, and her hands itching.  Dr. Harl Bowie thought it was vertigo and she does have a h/o this about 2 years ago.  She was given Meclizine in the ER and "it ain't fast as it was."  However, "it looks like the hand sanitizer is going up into the ceiling".  She has been feeling well other than the dizziness.      Clinical Impression  Patient states dizziness/vertigo like symptoms slightly improved since in hospital, but continues to c/o dizziness when standing demonstrating unsteady gait with tendency to lean on nearby objects for support, had to use RW and limited to ambulation to doorway before requesting to go back to bedside, tolerated sitting up in chair after therapy and encouraged to stay out of bed as much as possible while in hospital and ask for help when needing to go to bathroom.  Patient will benefit from continued physical therapy in hospital and recommended venue below to increase strength, balance, endurance for safe ADLs and gait.    Follow Up Recommendations SNF;Supervision for mobility/OOB    Equipment Recommendations  None recommended by PT    Recommendations for Other Services       Precautions / Restrictions Precautions Precautions: Fall Precaution Comments: dizziness/vertigo like  symtoms Restrictions Weight Bearing Restrictions: No      Mobility  Bed Mobility Overal bed mobility: Modified Independent             General bed mobility comments: uses siderail to pull self up  Transfers Overall transfer level: Needs assistance Equipment used: None;Rolling walker (2 wheeled) Transfers: Sit to/from [facility] Sit to Stand: Min assist Stand pivot transfers: Min assist       General transfer comment: very unsteady with poor standing balance when not using AD, improved using RW  Ambulation/Gait Ambulation/Gait assistance: Min assist Ambulation Distance (Feet): 15 Feet Assistive device: Rolling walker (2 wheeled) Gait Pattern/deviations: Decreased step length - right;Decreased step length - left;Decreased stride length;Staggering right;Staggering left   Gait velocity interpretation: Below normal speed for age/gender General Gait Details: demonstrates slow labored steps, limited mostly due to c/o dizziness, mild vertigo like symptoms and nausea  Stairs            Wheelchair Mobility    Modified Rankin (Stroke Patients Only)       Balance Overall balance assessment: Needs assistance Sitting-balance support: Feet supported;No upper extremity supported Sitting balance-Leahy Scale: Good     Standing balance support: No upper extremity supported;During functional activity Standing balance-Leahy Scale: Poor Standing balance comment: fair using RW                             Pertinent Vitals/Pain Pain Assessment: No/denies pain  Home Living Family/patient expects to be discharged to:: Private residence Living Arrangements: Alone Available Help at Discharge: Family;Friend(s);Available PRN/intermittently Type of Home: Apartment Home Access: Level entry     Home Layout: One level Home Equipment: Grab bars - tub/shower;Grab bars - toilet;Cane - single point;Walker - 2 wheels;Hand held shower head      Prior  Function Level of Independence: Independent         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     Hand Dominance   Dominant Hand: Right    Extremity/Trunk Assessment   Upper Extremity Assessment Upper Extremity Assessment: Defer to OT evaluation    Lower Extremity Assessment Lower Extremity Assessment: Overall WFL for tasks assessed    Cervical / Trunk Assessment Cervical / Trunk Assessment: Normal  Communication   Communication: No difficulties  Cognition Arousal/Alertness: Awake/alert Behavior During Therapy: WFL for tasks assessed/performed Overall Cognitive Status: Within Functional Limits for tasks assessed                                        General Comments      Exercises     Assessment/Plan    PT Assessment Patient needs continued PT services  PT Problem List Decreased activity tolerance;Decreased balance;Decreased mobility;Other (comment)(dizziness/vertigo like symptoms, nauesa)       PT Treatment Interventions Gait training;Functional mobility training;Therapeutic activities;Therapeutic exercise;Patient/family education;Stair training;Balance training    PT Goals (Current goals can be found in the Care Plan section)  Acute Rehab PT Goals Patient Stated Goal: to return home PT Goal Formulation: With patient Time For Goal Achievement: 03/29/18 Potential to Achieve Goals: Good    Frequency Min 3X/week   Barriers to discharge Decreased caregiver support patient lives alone    Co-evaluation   Reason for Co-Treatment: Complexity of the patient's impairments (multi-system involvement);To address functional/ADL transfers   OT goals addressed during session: ADL's and self-care       AM-PAC PT "6 Clicks" Daily Activity  Outcome Measure Difficulty turning over in bed (including adjusting bedclothes, sheets and blankets)?: None Difficulty moving from lying on back to sitting on the side of the bed? : None Difficulty  sitting down on and standing up from a chair with arms (e.g., wheelchair, bedside commode, etc,.)?: A Jackson Help needed moving to and from a bed to chair (including a wheelchair)?: A Jackson Help needed walking in hospital room?: A Jackson Help needed climbing 3-5 steps with a railing? : A Lot 6 Click Score: 19    End of Session Equipment Utilized During Treatment: Gait belt Activity Tolerance: Patient limited by fatigue(Patient limited by dizziness, vertigo like symptoms) Patient left: in chair;with call bell/phone within reach Nurse Communication: Mobility status PT Visit Diagnosis: Unsteadiness on feet (R26.81);Other abnormalities of gait and mobility (R26.89);Difficulty in walking, not elsewhere classified (R26.2)    Time: 6222-9798 PT Time Calculation (min) (ACUTE ONLY): 28 min   Charges:   PT Evaluation $PT Eval Moderate Complexity: 1 Mod PT Treatments $Therapeutic Activity: 23-37 mins   PT G Codes:        11:31 AM, Mar 25, 2018 Lonell Grandchild, MPT Physical Therapist with [facility] 336 [phone] office [phone] mobile phone

## 2018-03-15 NOTE — Progress Notes (Signed)
Pt discharged home today per Dr. Dyann Kief. Pt's IV site D/C'd and WDL. Pt's VSS. Pt provided with home medication list, discharge instructions and prescriptions. Verbalized understanding. Pt left floor via WC in stable condition accompanied by NS.

## 2018-03-15 NOTE — Progress Notes (Signed)
SLP Cancellation Note  Patient Details Name: Maureen Jackson MRN: [id number] DOB: [birth year]   Cancelled treatment:       Reason Eval/Treat Not Completed: SLP screened, no needs identified, will sign off. Pt is at cognitive baseline  Dakin Madani H. Roddie Mc, CCC-SLP Speech Language Pathologist    Wende Bushy 03/15/2018, 3:10 PM

## 2018-03-15 NOTE — Clinical Social Work Note (Signed)
Patient declines STR at SNF.    LCSW signing off.     Aubriel Khanna, Clydene Pugh, LCSW

## 2018-03-15 NOTE — Progress Notes (Signed)
Initial Nutrition Assessment  DOCUMENTATION CODES:  Morbid Obesity    INTERVENTION:  -Recommend adding CHO modified restrictions in addition to Heart Healthy dietary limitations  -Provided 20 ways to enjoy more fruits and vegetables and Weight Loss Tips and 1500 kcal Sample Meal Plan Handouts    -Recommend one on one nutrition counseling for wt loss [facility] (if pt is ready to make lifestyle changes).   NUTRITION DIAGNOSIS:   Food and nutrition related knowledge deficit related to limited prior education as evidenced by per patient/family report.   GOAL: Pt will pursue further nutrition counseling to support implementation of long term dietary changes that will promote 5-10% wt loss.     MONITOR: Patient readiness to make changes     REASON FOR ASSESSMENT:   Consult    ASSESSMENT:  Obese 62 yo female with hx of poorly controlled diabetes type II, HF, HTN and CAD. Patient presents  with complaint of dizziness.  RD saw pt for HF education back last September. She affirms weighing herself daily and says "I don't mess with salt". Her blood glucose has been poorly controlled CBG 225 mg/dl during RD visit today and pt says her blood sugar is regularly 200 or more. Her PCP has been adjusting her meds per pt.  Provided pt with multiple handouts to assist with weight loss and promote improved dietary quality overall.  She reports usual wt at 297 lb - per hospital records she has been 302-312 lb the past 5-6 months.   Labs: BMP Latest Ref Rng & Units 03/14/2018 03/14/2018 11/14/2017  Glucose 65 - 99 mg/dL 258(H) 267(H) 154(H)  BUN 6 - 20 mg/dL 22(H) 24(H) 33(H)  Creatinine 0.44 - 1.00 mg/dL 0.80 0.89 1.17(H)  Sodium 135 - 145 mmol/L 139 138 138  Potassium 3.5 - 5.1 mmol/L 3.9 4.1 4.2  Chloride 101 - 111 mmol/L 102 104 102  CO2 22 - 32 mmol/L - 23 28  Calcium 8.9 - 10.3 mg/dL - 9.4 8.9     Meds: . aspirin  81 mg Oral Daily  . atorvastatin  80 mg Oral  q1800  . clopidogrel  75 mg Oral Q breakfast  . enoxaparin (LOVENOX) injection  70 mg Subcutaneous Q24H  . furosemide  40 mg Oral Daily  . insulin aspart  0-20 Units Subcutaneous TID WC  . insulin aspart  0-5 Units Subcutaneous QHS  . isosorbide mononitrate  30 mg Oral Daily  . levothyroxine  200 mcg Oral QAC breakfast    NUTRITION - FOCUSED PHYSICAL EXAM:    Most Recent Value  Orbital Region  No depletion  Upper Arm Region  No depletion  Thoracic and Lumbar Region  No depletion  Buccal Region  No depletion  Temple Region  No depletion  Clavicle Bone Region  No depletion  Clavicle and Acromion Bone Region  No depletion  Scapular Bone Region  Unable to assess  Dorsal Hand  No depletion  Patellar Region  No depletion  Anterior Thigh Region  No depletion  Posterior Calf Region  No depletion  Edema (RD Assessment)  None  Hair  Unable to assess  Eyes  Reviewed  Mouth  Reviewed  Skin  Reviewed       Diet Order:  Fall precautions Diet Heart Room service appropriate? Yes; Fluid consistency: Thin  EDUCATION NEEDS:   Education needs have been addressed(see intervention)   Skin:   intact  Last BM:   4/2   Height:   Ht Readings from  Last 1 Encounters:  03/14/18 5\' 7"  (1.702 m)    Weight:   Wt Readings from Last 1 Encounters:  03/14/18 (!) 303 lb 2.1 oz (137.5 kg)    Ideal Body Weight:  61 kg  BMI:  Body mass index is 47.48 kg/m.  Estimated Nutritional Needs:   Kcal:  1800-2000 (~15 kcal/kg)  Protein:  115-128 gr  Fluid:  >2000 ml daily   Colman Cater MS,RD,CSG,LDN Office: [phone] Pager: [phone]

## 2018-03-15 NOTE — Care Management Note (Signed)
Case Management Note  Patient Details  Name: Maureen Jackson MRN: [id number] Date of Birth: Sep 01, 1956  Subjective/Objective:        Admitted with dizziness. Pt from home, lives alone has family support if needed, sister at bedside. She has been struggling with vertigo pta.             Action/Plan: PT recommends SNF. Pt does not want SNF. Wants to return home with [facility] services. Prefers [facility] for vestibular rehab. She is aware they have 48 hrs to make first visit. [facility] rep, aware of referral and will pull pt info   Expected Discharge Date:     03/15/2018             Expected Discharge Plan:  [place]  In-House Referral:  NA  Discharge planning Services  CM Consult  Post Acute Care Choice:  Home Health Choice offered to:  Patient  HH Arranged:  PT [place]:  [place]  Status of Service:  Completed, signed off  If discussed at [place] of Stay Meetings, dates discussed:    Additional Comments:  Sherald Barge, RN 03/15/2018, 12:35 PM

## 2018-03-15 NOTE — Progress Notes (Signed)
q 2 hour Stroke VS

## 2018-03-15 NOTE — Evaluation (Signed)
Occupational Therapy Evaluation Patient Details Name: CORTEZ STEELMAN MRN: [id number] DOB: 02-13-1956 Today's Date: 03/15/2018    History of Present Illness ANVITA HIRATA is a 62 y.o. 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   Tuesday, she developed dizziness for about an hour.  She saw her heart doctor today and told him about it.  He gave her an rx for meclizine.  When she got home, the room started spinning.  She was scared to stand up to get the pills.  She describes the dizziness as the room going fast, feeling sick on her stomach, and her hands itching.  Dr. Harl Bowie thought it was vertigo and she does have a h/o this about 2 years ago.  She was given Meclizine in the ER and "it ain't fast as it was."  However, "it looks like the hand sanitizer is going up into the ceiling".  She has been feeling well other than the dizziness.     Clinical Impression   Pt received sitting up in bed, agreeable to OT evaluation. PT arrived shortly after beginning session and joined evaluation. Pt reports left hand numbness as resolved, dizziness has improved however continues to become lightheaded occasionally. PTA pt independent in all ADLs, does not drive. During session pt is able to complete ADLs, unable to donn right sock due to difference in height of sitting surface in hospital compared to home. Upon sitting at EOB pt reports her head feels "heavy", attempted functional mobility with PT, was able to go outside of doorway before requesting to go sit down. Pt is at her baseline functionally with ADLs, limited due to dizziness/lightheadedness. No further OT needs at this time, recommend supervision for safety on discharge, if pt unable to obtain assistance/supervision may need to look at STR/SNF for safety.     Follow Up Recommendations  No  OT follow up;Supervision/Assistance - 24 hour;Supervision - Intermittent    Equipment Recommendations  Tub/shower seat       Precautions / Restrictions Precautions Precautions: Fall Precaution Comments: dizziness/vertigo Restrictions Weight Bearing Restrictions: No      Mobility Bed Mobility               General bed mobility comments: Defer to PT note  Transfers                 General transfer comment: Defer to PT note        ADL either performed or assessed with clinical judgement   ADL Overall ADL's : Needs assistance/impaired Eating/Feeding: Modified independent;Sitting   Grooming: Modified independent;Sitting Grooming Details (indicate cue type and reason): Pt unable to maintain standing for ADLs due to dizziness. Seated is able to peform with modified independence         Upper Body Dressing : Minimal assistance Upper Body Dressing Details (indicate cue type and reason): Assist for donning gown on back and managing buttons/ties Lower Body Dressing: Moderate assistance;Sitting/lateral leans Lower Body Dressing Details (indicate cue type and reason): Pt able to donn sock on left foot, unable to donn sock on right foot. Pt unable to give explanation other than she can do it at home, thinks her chair is lower than the hospital bed             Functional mobility during ADLs: Min guard;Rolling walker       Vision  Baseline Vision/History: Wears glasses Wears Glasses: At all times Patient Visual Report: No change from baseline Vision Assessment?: No apparent visual deficits            Pertinent Vitals/Pain Pain Assessment: No/denies pain     Hand Dominance Right   Extremity/Trunk Assessment Upper Extremity Assessment Upper Extremity Assessment: Overall WFL for tasks assessed   Lower Extremity Assessment Lower Extremity Assessment: Defer to PT evaluation   Cervical / Trunk Assessment Cervical / Trunk Assessment: Normal    Communication Communication Communication: No difficulties   Cognition Arousal/Alertness: Awake/alert Behavior During Therapy: WFL for tasks assessed/performed Overall Cognitive Status: Within Functional Limits for tasks assessed                                                Home Living Family/patient expects to be discharged to:: Private residence Living Arrangements: Alone Available Help at Discharge: Family;Friend(s);Available PRN/intermittently Type of Home: Apartment Home Access: Level entry     Home Layout: One level     Bathroom Shower/Tub: Teacher, early years/pre: Standard     Home Equipment: Grab bars - tub/shower;Grab bars - toilet;Cane - single point;Walker - 2 wheels;Hand held shower head          Prior Functioning/Environment Level of Independence: Independent        Comments: Pt is independent in B/ADL completion, meal preparation, medication management; does not drive.                  OT Goals(Current goals can be found in the care plan section) Acute Rehab OT Goals Patient Stated Goal: to return home  OT Frequency:             Co-evaluation PT/OT/SLP Co-Evaluation/Treatment: Yes Reason for Co-Treatment: Complexity of the patient's impairments (multi-system involvement);To address functional/ADL transfers   OT goals addressed during session: ADL's and self-care      AM-PAC PT "6 Clicks" Daily Activity     Outcome Measure Help from another person eating meals?: None Help from another person taking care of personal grooming?: None Help from another person toileting, which includes using toliet, bedpan, or urinal?: None Help from another person bathing (including washing, rinsing, drying)?: A Little Help from another person to put on and taking off regular upper body clothing?: A Little Help from another person to put on and taking off regular lower body clothing?: A Little 6 Click Score: 21   End of  Session Equipment Utilized During Treatment: Gait belt;Rolling walker  Activity Tolerance: Patient tolerated treatment well Patient left: in chair;with call bell/phone within reach;with nursing/sitter in room  OT Visit Diagnosis: Dizziness and giddiness (R42)                Time: 0539-7673 OT Time Calculation (min): 21 min Charges:  OT General Charges $OT Visit: 1 Visit OT Evaluation $OT Eval Low Complexity: 1 Low    Guadelupe Sabin, OTR/L  [phone] 03/15/2018, 8:45 AM

## 2018-03-18 ENCOUNTER — Encounter: Payer: Self-pay | Admitting: Cardiology

## 2018-03-18 NOTE — Progress Notes (Signed)
02/11/2018 CODE STROKE  1639 CALL TIME 1642 BEEPER TIME 1646 EXAM STARTED 1647 EXAM FINISHED 1647 IMAGES SENT TO SOC 1650 EXAM COMPLETED IN EPIC 1650 [place] [facility] CALLED

## 2018-03-28 ENCOUNTER — Ambulatory Visit: Payer: Medicare HMO

## 2018-04-01 ENCOUNTER — Ambulatory Visit (INDEPENDENT_AMBULATORY_CARE_PROVIDER_SITE_OTHER): Payer: Medicare HMO

## 2018-04-01 ENCOUNTER — Other Ambulatory Visit (HOSPITAL_COMMUNITY)
Admission: RE | Admit: 2018-04-01 | Discharge: 2018-04-01 | Disposition: A | Discharge status: Home / Self Care | Payer: Medicare HMO | Source: Ambulatory Visit | Attending: Cardiology | Admitting: Cardiology

## 2018-04-01 ENCOUNTER — Telehealth: Payer: Self-pay

## 2018-04-01 VITALS — BP 130/76 | HR 85 | Ht 65.0 in | Wt 301.0 lb

## 2018-04-01 DIAGNOSIS — Z0130 Encounter for examination of blood pressure without abnormal findings: Secondary | ICD-10-CM

## 2018-04-01 DIAGNOSIS — Z79899 Other long term (current) drug therapy: Secondary | ICD-10-CM | POA: Insufficient documentation

## 2018-04-01 LAB — BASIC METABOLIC PANEL
Anion gap: 10 (ref 5–15)
BUN: 22 mg/dL — ABNORMAL HIGH (ref 6–20)
CHLORIDE: 106 mmol/L (ref 101–111)
CO2: 24 mmol/L (ref 22–32)
CREATININE: 1.03 mg/dL — AB (ref 0.44–1.00)
Calcium: 8.8 mg/dL — ABNORMAL LOW (ref 8.9–10.3)
GFR calc non Af Amer: 58 mL/min — ABNORMAL LOW (ref >60)
Glucose, Bld: 244 mg/dL — ABNORMAL HIGH (ref 65–99)
Potassium: 4.2 mmol/L (ref 3.5–5.1)
SODIUM: 140 mmol/L (ref 135–145)

## 2018-04-01 NOTE — Telephone Encounter (Addendum)
-----   Message from Arnoldo Lenis, MD sent at 04/01/2018  4:40 PM EDT -----   ----- Message ----- From: Bernita Raisin, RN Sent: 04/01/2018   2:48 PM To: Arnoldo Lenis, MD  Editor: Arnoldo Lenis, MD (Physician)    Mild change in her kidney function since last check, we will continue current meds, I had considered starting aldactone but need to see where kidney function goes. Repeat BMET/Mg in in 2 week.         LM telling pt I will may her lab slips

## 2018-04-01 NOTE — Patient Instructions (Signed)
Continue medications as planned unless we call you.   We will call you about your lab results      Thank you for choosing [facility] !

## 2018-04-01 NOTE — Progress Notes (Signed)
Mild change in her kidney function since last check, we will continue current meds, I had considered starting aldactone but need to see where kidney function goes. Repeat BMET/Mg in in 2 week.    Carlyle Dolly MD

## 2018-04-01 NOTE — Progress Notes (Signed)
Patient has lost 4 lbs since last visit, feels great BP is 130/76, HR 85   Patient had echo in October and wonders when she needs another     I told her I would f/u with Dr Harl Bowie regarding this

## 2018-04-06 DIAGNOSIS — R69 Illness, unspecified: Secondary | ICD-10-CM | POA: Diagnosis not present

## 2018-04-10 DIAGNOSIS — E785 Hyperlipidemia, unspecified: Secondary | ICD-10-CM | POA: Insufficient documentation

## 2018-04-10 DIAGNOSIS — E782 Mixed hyperlipidemia: Secondary | ICD-10-CM | POA: Insufficient documentation

## 2018-04-10 NOTE — Progress Notes (Signed)
Cardiology Office Note    Date:  04/15/2018   ID:  CYNCERE Jackson, DOB 1956/01/22, MRN [id number]  PCP:  Neale Burly, MD  Cardiologist: Carlyle Dolly, MD  Chief Complaint  Patient presents with  . Follow-up    History of Present Illness:  Maureen Jackson is a 62 y.o. female  with history of CAD status post DES to the LAD and DES to the RCA x2, circumflex disease managed medically, lifelong Plavix recommended on cath 09/2017, ischemic cardiomyopathy ejection fraction 30-35% with grade 1 DD echo 08/2017, moderate pericardial effusion on echo 09/2017 stable by serial imaging without evidence of tamponade.  Severe hypothyroidism probably the cause.  TSH was 14 being replaced by PCP.   Patient last saw Dr. Harl Bowie 03/14/2018 and blood pressure was elevated so Entresto was increased.  Was wanting to start Aldactone but following renal function closely watching trends.  Also consider higher dose Coreg due to BMI in the future.  Patient was then hospitalized 03/15/2018 with vertigo.  Creatinine 04/01/2018 was 1.03.  No changes were made.  Patient comes in today for follow-up.  It is her 62th birthday today.  She says she has not had any problems with heart failure since starting on Entresto.  She is watching her sodium intake carefully.  She denies chest pain, palpitations, dyspnea, dyspnea on exertion, dizziness or presyncope.     Past Medical History:  Diagnosis Date  . 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 Coronary artery disease 09/2017   stents placed  . Diabetes mellitus   . Dyspnea   . Glaucoma   . Hypertension   . Morbid obesity ([place])   . Obstructive sleep apnea    does not wear CPAP  . Thyroid disease     Past Surgical History:  Procedure Laterality Date  . ABDOMINAL HYSTERECTOMY    . CARDIAC CATHETERIZATION  09/12/2017  . CORONARY STENT INTERVENTION  09/12/2017   STENT RESOLUTE ONYX 9.74B63 drug eluting stent was successfully placed  . CORONARY STENT INTERVENTION N/A  09/12/2017   Procedure: CORONARY STENT INTERVENTION;  Surgeon: Leonie Man, MD;  Location: [place];  Service: Cardiovascular;  Laterality: N/A;  . LEFT HEART CATH AND CORONARY ANGIOGRAPHY N/A 09/12/2017   Procedure: LEFT HEART CATH AND CORONARY ANGIOGRAPHY;  Surgeon: Leonie Man, MD;  Location: [place] [facility];  Service: Cardiovascular;  Laterality: N/A;  . RIGHT/LEFT HEART CATH AND CORONARY ANGIOGRAPHY N/A 09/10/2017   Procedure: RIGHT/LEFT HEART CATH AND CORONARY ANGIOGRAPHY;  Surgeon: Troy Sine, MD;  Location: [place] [facility];  Service: Cardiovascular;  Laterality: N/A;  . THYROID SURGERY      Current Medications: Current Meds  Medication Sig  . amLODipine (NORVASC) 5 MG tablet Take 1 tablet (5 mg total) by mouth daily.  Marland Kitchen aspirin EC 325 MG tablet Take 1 tablet (325 mg total) by mouth daily.  . carvedilol (COREG) 25 MG tablet Take 1 tablet (25 mg total) by mouth 2 (two) times daily.  . clopidogrel (PLAVIX) 75 MG tablet Take 1 tablet (75 mg total) by mouth daily with breakfast.  . furosemide (LASIX) 40 MG tablet Take 1 tablet (40 mg total) by mouth daily.  . isosorbide mononitrate (IMDUR) 30 MG 24 hr tablet Take 1 tablet (30 mg total) by mouth daily.  Marland Kitchen JARDIANCE 10 MG TABS tablet Take 10 mg by mouth daily.  Marland Kitchen levothyroxine (SYNTHROID, LEVOTHROID) 200 MCG tablet TK 1 T PO QD  .  meclizine (ANTIVERT) 25 MG tablet Take 1 tablet (25 mg total) by mouth 3 (three) times daily as needed for dizziness.  Glory Rosebush VERIO test strip USE TO TEST BLOOD SUGAR BID  . sacubitril-valsartan (ENTRESTO) 97-103 MG Take 1 tablet by mouth 2 (two) times daily.  . VENTOLIN HFA 108 (90 Base) MCG/ACT inhaler Inhale 1-2 puffs into the lungs every 6 (six) hours as needed for wheezing or shortness of breath. (Patient taking differently: Inhale 1-2 puffs into the lungs as needed for wheezing or shortness of breath. )     Allergies:   Aspirin   Social History   Socioeconomic History  .  Marital status: Widowed    Spouse name: Not on file  . Number of children: Not on file  . Years of education: Not on file  . Highest education level: Not on file  Occupational History  . 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 Financial resource strain: Not on file  . Food insecurity:    Worry: Not on file    Inability: Not on file  . Transportation needs:    Medical: Not on file    Non-medical: Not on file  Tobacco Use  . Smoking status: Former Smoker    Last attempt to quit: 12/14/2005    Years since quitting: 12.3  . Smokeless tobacco: Never Used  . Tobacco comment: QUIT IN 2005  Substance and Sexual Activity  . Alcohol use: No  . Drug use: No  . Sexual activity: Never  Lifestyle  . Physical activity:    Days per week: Not on file    Minutes per session: Not on file  . Stress: Not on file  Relationships  . Social connections:    Talks on phone: Not on file    Gets together: Not on file    Attends religious service: Not on file    Active member of club or organization: Not on file    Attends meetings of clubs or organizations: Not on file    Relationship status: Not on file  Other Topics Concern  . Not on file  Social History Narrative  . Not on file     Family History:  The patient's family history includes Cancer in her mother; Diabetes in her mother; Hypertension in her mother and sister.   ROS:   Please see the history of present illness.    Review of Systems  Constitution: Negative.  HENT: Negative.   Eyes: Negative.   Cardiovascular: Negative.   Respiratory: Negative.   Hematologic/Lymphatic: Negative.   Musculoskeletal: Negative.  Negative for joint pain.  Gastrointestinal: Negative.   Genitourinary: Negative.   Neurological: Negative.    All other systems reviewed and are negative.   PHYSICAL EXAM:   VS:  BP (!) 144/78   Pulse 66   Ht 5\' 5"  (1.651 m)   Wt (!) 302 lb (137 kg)   SpO2 97%   BMI 50.26 kg/m     Physical Exam  GEN: Obese, in no acute distress  Neck: no JVD, carotid bruits, or masses Cardiac:RRR; no murmurs, rubs, or gallops  Respiratory:  clear to auscultation bilaterally, normal work of breathing GI: soft, nontender, nondistended, + BS Ext: without cyanosis, clubbing, or edema, Good distal pulses bilaterally Neuro:  Alert and Oriented x 3 Psych: euthymic mood, full affect  Wt Readings from Last 3 Encounters:  04/15/18 (!) 302 lb (137 kg)  04/01/18 (!) 301 lb (136.5 kg)  03/14/18 Marland Kitchen)  303 lb 2.1 oz (137.5 kg)      Studies/Labs Reviewed:   EKG:  EKG is not ordered today.  Recent Labs: 09/05/2017: B Natriuretic Peptide 386.0; TSH 14.361 10/30/2017: Magnesium 1.8 03/14/2018: ALT 33; Hemoglobin 12.9; Platelets 257 04/01/2018: BUN 22; Creatinine, Ser 1.03; Potassium 4.2; Sodium 140   Lipid Panel    Component Value Date/Time   CHOL 144 03/15/2018 0643   TRIG 160 (H) 03/15/2018 0643   HDL 32 (L) 03/15/2018 0643   CHOLHDL 4.5 03/15/2018 0643   VLDL 32 03/15/2018 0643   LDLCALC 80 03/15/2018 0643    Additional studies/ records that were reviewed today include:  09/2017 diagnostic cath  Prox RCA lesion, 30 %stenosed.  Prox RCA to Mid RCA lesion, 70 %stenosed.  Mid RCA lesion, 90 %stenosed.  RPDA lesion, 80 %stenosed.  Prox Cx lesion, 20 %stenosed.  Ost 1st Mrg lesion, 80 %stenosed.  1st Mrg-2 lesion, 80 %stenosed.  1st Mrg-1 lesion, 90 %stenosed.  Mid Cx-2 lesion, 50 %stenosed.  Mid Cx-1 lesion, 60 %stenosed.  Prox LAD lesion, 60 %stenosed.  Mid LAD lesion, 85 %stenosed.   Severe multivessel CAD with 60% proximal LAD stenosis and 85% mid LAD stenosis; 80, 90 and 80%  OM1 stenosis of the left circumflex with 60% mid AV groove and 50% mid distal AV groove stenosis; dominant RCA with 30% proximal followed by 70% proximal stenosis, 90% mid stenosis, and diffuse mid distal PDA stenosis of at least 80%.   LVEDP 33 mmHg;  EF by previous echo 30-35%; left  ventriculography not performed.   Moderate right heart pressure elevation with mild/moderate pulmonary hypertension.   RECOMMENDATION: In this diabetic female with significant multivessel CAD, recommend surgical consultation for consideration for CABG revascularization.   09/2017 PCI  LESION #1  Prox LAD lesion, 60 %stenosed. Prox LAD to Mid LAD lesion, 65 %stenosed. Mid LAD lesion, 85 %stenosed. Tandem Lesions.  A STENT RESOLUTE ONYX [id number] drug eluting stent was successfully placed. Post-dilated in tapered fashion: 3.6 - 2.9 mm  Post intervention, there is a 0% residual stenosis.  LESION $2  Prox RCA lesion, 45 %stenosed. Prox RCA to Mid RCA lesion, 70 %stenosed. Mid RCA lesion, 90 %stenosed. Tandem Lesions  A STENT RESOLUTE ONYX [id number] drug eluting stent and STENT RESOLUTE ONYX 2.75X26 drug-eluting stent were successfully placed in overlapping fashion.  Post intervention, there is a 0% residual stenosis. Post-dilated in tapered fashion: 3.6 - 3.1 mm  RPDA lesion, 80 %stenosed. Plan Medical management.   Successful 2 vessel PCI with stable circumflex disease. Plan is to treat circumflex flex disease medically for now.     Plan: Return to nursing unit for post PCI care TR band removal. Continue to optimize medical management, but from a post Standpoint she will be able to be discharged tomorrow. Continue aspirin plus Plavix for minimum one year, but would continue Plavix lifelong         08/2017 echo Study Conclusions   - Procedure narrative: Transthoracic echocardiography. Image   quality was fair. The study was technically difficult, as a   result of poor sound wave transmission. Intravenous contrast   (Definity) was administered. - Left ventricle: The cavity size was mildly dilated. Systolic   function was moderately to severely reduced. The estimated   ejection fraction was in the range of 30% to 35%. Diffuse   hypokinesis. Doppler parameters are consistent with  abnormal left   ventricular relaxation (grade 1 diastolic dysfunction). Mild   concentric and moderate focal basal septal  hypertrophy. - Ventricular septum: Septal motion showed abnormal function and   dyssynergy. These changes are consistent with a left bundle   branch block. - Aortic valve: Mildly calcified annulus. - Systemic veins: IVC is dilated with normal respiratory variation.   It was suboptimally visualized. Estimated right atrial pressure:   8 mmHg. - Pericardium, extracardiac: Moderate size pericardial effusion.   There is some right atrial inversion noted, but no frank   tamponade physiology.              ASSESSMENT:    1. Chronic combined systolic and diastolic CHF (congestive heart failure) ([place])   2. Essential hypertension   3. CAD, multiple vessel   4. Ischemic cardiomyopathy   5. Mixed hyperlipidemia   6. Pericardial effusion      PLAN:  In order of problems listed above:  Chronic combined systolic and diastolic CHF.  Well compensated.  On highest dose to [facility].  Dr. Harl Bowie has been wanting to add Aldactone but watching renal trend closely.  Will check renal today.  Follow-up with Dr. Harl Bowie in 2 months.  Ischemic cardiomyopathy ejection fraction 30 to 35% on echo 08/2017.Consider repeat in the future to reevaluate  Essential hypertension blood pressure stable but room to move if need to add Aldactone.  CAD status post DES to the LAD and RCA x2, circumflex disease managed medically.  Lifelong Plavix recommended at cath 09/2017  Hyperlipidemia on atorvastatin LDL 80 lipid panel 03/14/2018  Pericardial effusion on echo 09/2017 felt to be moderate without tamponade.  Felt secondary to severe hypothyroidism.   Medication Adjustments/Labs and Tests Ordered: Current medicines are reviewed at length with the patient today.  Concerns regarding medicines are outlined above.  Medication changes, Labs and Tests ordered today are listed in the Patient  Instructions below. Patient Instructions  Medication Instructions:  Your physician recommends that you continue on your current medications as directed. Please refer to the Current Medication list given to you today.   Labwork: Your physician recommends that you return for lab work Today    Testing/Procedures: NONE   Follow-Up: Your physician recommends that you schedule a follow-up appointment in: August with Dr. Harl Bowie.     Any Other Special Instructions Will Be Listed Below (If Applicable).     If you need a refill on your cardiac medications before your next appointment, please call your pharmacy. Thank you for choosing [facility]!       Sumner Boast, PA-C  04/15/2018 11:47 AM    [facility] [address], [place], [place]  [address] Phone: [phone]; Fax: [phone]

## 2018-04-15 ENCOUNTER — Encounter: Payer: Self-pay | Admitting: Physician Assistant

## 2018-04-15 ENCOUNTER — Ambulatory Visit (INDEPENDENT_AMBULATORY_CARE_PROVIDER_SITE_OTHER): Payer: Medicare HMO | Admitting: Physician Assistant

## 2018-04-15 ENCOUNTER — Other Ambulatory Visit (HOSPITAL_COMMUNITY)
Admission: RE | Admit: 2018-04-15 | Discharge: 2018-04-15 | Disposition: A | Discharge status: Home / Self Care | Payer: Medicare HMO | Source: Ambulatory Visit | Attending: Cardiology | Admitting: Cardiology

## 2018-04-15 VITALS — BP 144/78 | HR 66 | Ht 65.0 in | Wt 302.0 lb

## 2018-04-15 DIAGNOSIS — Z79899 Other long term (current) drug therapy: Secondary | ICD-10-CM | POA: Insufficient documentation

## 2018-04-15 DIAGNOSIS — I5042 Chronic combined systolic (congestive) and diastolic (congestive) heart failure: Secondary | ICD-10-CM | POA: Diagnosis not present

## 2018-04-15 DIAGNOSIS — E782 Mixed hyperlipidemia: Secondary | ICD-10-CM

## 2018-04-15 DIAGNOSIS — I255 Ischemic cardiomyopathy: Secondary | ICD-10-CM | POA: Diagnosis not present

## 2018-04-15 DIAGNOSIS — I10 Essential (primary) hypertension: Secondary | ICD-10-CM

## 2018-04-15 DIAGNOSIS — I2510 Atherosclerotic heart disease of native coronary artery without angina pectoris: Secondary | ICD-10-CM

## 2018-04-15 DIAGNOSIS — I313 Pericardial effusion (noninflammatory): Secondary | ICD-10-CM

## 2018-04-15 DIAGNOSIS — I3139 Other pericardial effusion (noninflammatory): Secondary | ICD-10-CM

## 2018-04-15 LAB — BASIC METABOLIC PANEL
Anion gap: 7 (ref 5–15)
BUN: 20 mg/dL (ref 6–20)
CO2: 27 mmol/L (ref 22–32)
CREATININE: 0.93 mg/dL (ref 0.44–1.00)
Calcium: 8.7 mg/dL — ABNORMAL LOW (ref 8.9–10.3)
Chloride: 106 mmol/L (ref 101–111)
GFR calc Af Amer: >60 mL/min (ref >60)
GFR calc non Af Amer: >60 mL/min (ref >60)
Glucose, Bld: 222 mg/dL — ABNORMAL HIGH (ref 65–99)
Potassium: 4.1 mmol/L (ref 3.5–5.1)
SODIUM: 140 mmol/L (ref 135–145)

## 2018-04-15 LAB — MAGNESIUM: Magnesium: 2.1 mg/dL (ref 1.7–2.4)

## 2018-04-15 NOTE — Patient Instructions (Signed)
Medication Instructions:  Your physician recommends that you continue on your current medications as directed. Please refer to the Current Medication list given to you today.   Labwork: Your physician recommends that you return for lab work Today    Testing/Procedures: NONE   Follow-Up: Your physician recommends that you schedule a follow-up appointment in: August with Dr. Harl Bowie.     Any Other Special Instructions Will Be Listed Below (If Applicable).     If you need a refill on your cardiac medications before your next appointment, please call your pharmacy. Thank you for choosing [facility]!

## 2018-04-18 ENCOUNTER — Telehealth: Payer: Self-pay

## 2018-04-18 DIAGNOSIS — Z79899 Other long term (current) drug therapy: Secondary | ICD-10-CM

## 2018-04-18 MED ORDER — SPIRONOLACTONE 25 MG PO TABS: 12.5000 mg | ORAL_TABLET | Freq: Every day | ORAL | 3 refills | Status: DC

## 2018-04-18 NOTE — Telephone Encounter (Signed)
Called pt, no answer. Left message for pt to return call. Placed orders for aldactone & labs.

## 2018-04-18 NOTE — Telephone Encounter (Signed)
-----   Message from Arnoldo Lenis, MD sent at 04/18/2018 11:05 AM EDT ----- Labs look good, please start aldactone 12.5mg  daily, repeat BMET in 2 weeks   Zandra Abts MD

## 2018-05-21 DIAGNOSIS — R69 Illness, unspecified: Secondary | ICD-10-CM | POA: Diagnosis not present

## 2018-07-16 ENCOUNTER — Other Ambulatory Visit: Payer: Self-pay

## 2018-07-16 ENCOUNTER — Encounter (HOSPITAL_COMMUNITY): Payer: Self-pay

## 2018-07-16 ENCOUNTER — Emergency Department (HOSPITAL_COMMUNITY): Payer: Medicare HMO

## 2018-07-16 ENCOUNTER — Observation Stay (HOSPITAL_COMMUNITY)
Admission: EM | Admit: 2018-07-16 | Discharge: 2018-07-18 | Disposition: A | Discharge status: Home / Self Care | Payer: Medicare HMO | Attending: Internal Medicine | Admitting: Internal Medicine

## 2018-07-16 DIAGNOSIS — E118 Type 2 diabetes mellitus with unspecified complications: Secondary | ICD-10-CM

## 2018-07-16 DIAGNOSIS — I200 Unstable angina: Secondary | ICD-10-CM | POA: Diagnosis not present

## 2018-07-16 DIAGNOSIS — Z886 Allergy status to analgesic agent status: Secondary | ICD-10-CM | POA: Diagnosis not present

## 2018-07-16 DIAGNOSIS — Z833 Family history of diabetes mellitus: Secondary | ICD-10-CM | POA: Insufficient documentation

## 2018-07-16 DIAGNOSIS — G4733 Obstructive sleep apnea (adult) (pediatric): Secondary | ICD-10-CM | POA: Insufficient documentation

## 2018-07-16 DIAGNOSIS — E785 Hyperlipidemia, unspecified: Secondary | ICD-10-CM | POA: Diagnosis not present

## 2018-07-16 DIAGNOSIS — Z8249 Family history of ischemic heart disease and other diseases of the circulatory system: Secondary | ICD-10-CM | POA: Insufficient documentation

## 2018-07-16 DIAGNOSIS — Z7982 Long term (current) use of aspirin: Secondary | ICD-10-CM | POA: Insufficient documentation

## 2018-07-16 DIAGNOSIS — Z79899 Other long term (current) drug therapy: Secondary | ICD-10-CM | POA: Diagnosis not present

## 2018-07-16 DIAGNOSIS — E1165 Type 2 diabetes mellitus with hyperglycemia: Secondary | ICD-10-CM

## 2018-07-16 DIAGNOSIS — I5042 Chronic combined systolic (congestive) and diastolic (congestive) heart failure: Secondary | ICD-10-CM | POA: Diagnosis present

## 2018-07-16 DIAGNOSIS — IMO0002 Reserved for concepts with insufficient information to code with codable children: Secondary | ICD-10-CM

## 2018-07-16 DIAGNOSIS — R079 Chest pain, unspecified: Secondary | ICD-10-CM | POA: Diagnosis present

## 2018-07-16 DIAGNOSIS — I10 Essential (primary) hypertension: Secondary | ICD-10-CM | POA: Diagnosis present

## 2018-07-16 DIAGNOSIS — H409 Unspecified glaucoma: Secondary | ICD-10-CM | POA: Diagnosis not present

## 2018-07-16 DIAGNOSIS — M170 Bilateral primary osteoarthritis of knee: Secondary | ICD-10-CM | POA: Insufficient documentation

## 2018-07-16 DIAGNOSIS — Z955 Presence of coronary angioplasty implant and graft: Secondary | ICD-10-CM | POA: Diagnosis not present

## 2018-07-16 DIAGNOSIS — R0789 Other chest pain: Secondary | ICD-10-CM | POA: Diagnosis present

## 2018-07-16 DIAGNOSIS — I2510 Atherosclerotic heart disease of native coronary artery without angina pectoris: Secondary | ICD-10-CM | POA: Diagnosis not present

## 2018-07-16 DIAGNOSIS — I110 Hypertensive heart disease with heart failure: Secondary | ICD-10-CM | POA: Insufficient documentation

## 2018-07-16 DIAGNOSIS — Z90710 Acquired absence of both cervix and uterus: Secondary | ICD-10-CM | POA: Diagnosis not present

## 2018-07-16 DIAGNOSIS — Z87891 Personal history of nicotine dependence: Secondary | ICD-10-CM | POA: Diagnosis not present

## 2018-07-16 DIAGNOSIS — E1159 Type 2 diabetes mellitus with other circulatory complications: Secondary | ICD-10-CM | POA: Diagnosis present

## 2018-07-16 DIAGNOSIS — Z8673 Personal history of transient ischemic attack (TIA), and cerebral infarction without residual deficits: Secondary | ICD-10-CM | POA: Diagnosis not present

## 2018-07-16 DIAGNOSIS — E039 Hypothyroidism, unspecified: Secondary | ICD-10-CM | POA: Diagnosis not present

## 2018-07-16 DIAGNOSIS — Z7901 Long term (current) use of anticoagulants: Secondary | ICD-10-CM | POA: Insufficient documentation

## 2018-07-16 DIAGNOSIS — E782 Mixed hyperlipidemia: Secondary | ICD-10-CM | POA: Diagnosis present

## 2018-07-16 DIAGNOSIS — G459 Transient cerebral ischemic attack, unspecified: Secondary | ICD-10-CM | POA: Diagnosis present

## 2018-07-16 DIAGNOSIS — Z9889 Other specified postprocedural states: Secondary | ICD-10-CM | POA: Insufficient documentation

## 2018-07-16 LAB — BRAIN NATRIURETIC PEPTIDE: B NATRIURETIC PEPTIDE 5: 50.2 pg/mL (ref 0.0–100.0)

## 2018-07-16 LAB — BASIC METABOLIC PANEL
Anion gap: 10 (ref 5–15)
BUN: 18 mg/dL (ref 6–20)
CALCIUM: 8.5 mg/dL — AB (ref 8.9–10.3)
CO2: 21 mmol/L — ABNORMAL LOW (ref 22–32)
CREATININE: 0.96 mg/dL (ref 0.44–1.00)
Chloride: 108 mmol/L (ref 98–111)
GFR calc Af Amer: >60 mL/min (ref >60)
GFR calc non Af Amer: >60 mL/min (ref >60)
GLUCOSE: 218 mg/dL — AB (ref 70–99)
Potassium: 4.4 mmol/L (ref 3.5–5.1)
Sodium: 139 mmol/L (ref 135–145)

## 2018-07-16 LAB — CBC
HCT: 39.1 % (ref 36.0–46.0)
Hemoglobin: 12.3 g/dL (ref 12.0–15.0)
MCH: 26.7 pg (ref 26.0–34.0)
MCHC: 31.5 g/dL (ref 30.0–36.0)
MCV: 84.8 fL (ref 78.0–100.0)
PLATELETS: 242 10*3/uL (ref 150–400)
RBC: 4.61 MIL/uL (ref 3.87–5.11)
RDW: 14.2 % (ref 11.5–15.5)
WBC: 7.4 10*3/uL (ref 4.0–10.5)

## 2018-07-16 LAB — I-STAT BETA HCG BLOOD, ED (MC, WL, AP ONLY): I-stat hCG, quantitative: <5 m[IU]/mL (ref <5)

## 2018-07-16 LAB — I-STAT TROPONIN, ED
TROPONIN I, POC: 0.02 ng/mL (ref 0.00–0.08)
Troponin i, poc: 0 ng/mL (ref 0.00–0.08)

## 2018-07-16 LAB — TROPONIN I: Troponin I: <0.03 ng/mL (ref <0.03)

## 2018-07-16 LAB — CBG MONITORING, ED: Glucose-Capillary: 247 mg/dL — ABNORMAL HIGH (ref 70–99)

## 2018-07-16 MED ORDER — ATORVASTATIN CALCIUM 80 MG PO TABS
80.0000 mg | ORAL_TABLET | Freq: Every day | ORAL | Status: DC
  Administered 2018-07-17: 80 mg via ORAL
  Filled 2018-07-16: qty 1

## 2018-07-16 MED ORDER — NITROGLYCERIN 0.4 MG SL SUBL: 0.4000 mg | SUBLINGUAL_TABLET | SUBLINGUAL | Status: DC | PRN

## 2018-07-16 MED ORDER — ENOXAPARIN SODIUM 40 MG/0.4ML ~~LOC~~ SOLN
40.0000 mg | SUBCUTANEOUS | Status: DC
  Administered 2018-07-16 – 2018-07-17 (×2): 40 mg via SUBCUTANEOUS
  Filled 2018-07-16 (×2): qty 0.4

## 2018-07-16 MED ORDER — SPIRONOLACTONE 12.5 MG HALF TABLET
12.5000 mg | ORAL_TABLET | Freq: Every day | ORAL | Status: DC
  Administered 2018-07-16 – 2018-07-18 (×3): 12.5 mg via ORAL
  Filled 2018-07-16 (×3): qty 1

## 2018-07-16 MED ORDER — MORPHINE SULFATE (PF) 2 MG/ML IV SOLN
2.0000 mg | INTRAVENOUS | Status: DC | PRN
Start: 2018-07-16 — End: 2018-07-18

## 2018-07-16 MED ORDER — CLOPIDOGREL BISULFATE 75 MG PO TABS
75.0000 mg | ORAL_TABLET | Freq: Every day | ORAL | Status: DC
  Administered 2018-07-17 – 2018-07-18 (×2): 75 mg via ORAL
  Filled 2018-07-16 (×2): qty 1

## 2018-07-16 MED ORDER — HYDRALAZINE HCL 20 MG/ML IJ SOLN: 5.0000 mg | INTRAMUSCULAR | Status: DC | PRN

## 2018-07-16 MED ORDER — ISOSORBIDE MONONITRATE ER 30 MG PO TB24
30.0000 mg | ORAL_TABLET | Freq: Every day | ORAL | Status: DC
  Administered 2018-07-17: 30 mg via ORAL
  Filled 2018-07-16: qty 1

## 2018-07-16 MED ORDER — ZOLPIDEM TARTRATE 5 MG PO TABS: 5.0000 mg | ORAL_TABLET | Freq: Every evening | ORAL | Status: DC | PRN

## 2018-07-16 MED ORDER — LEVOTHYROXINE SODIUM 100 MCG PO TABS
200.0000 ug | ORAL_TABLET | Freq: Every day | ORAL | Status: DC
  Filled 2018-07-16: qty 1

## 2018-07-16 MED ORDER — INSULIN ASPART 100 UNIT/ML ~~LOC~~ SOLN
0.0000 [IU] | Freq: Three times a day (TID) | SUBCUTANEOUS | Status: DC
  Administered 2018-07-17: 3 [IU] via SUBCUTANEOUS
  Administered 2018-07-17: 2 [IU] via SUBCUTANEOUS

## 2018-07-16 MED ORDER — ALPRAZOLAM 0.25 MG PO TABS
0.2500 mg | ORAL_TABLET | Freq: Two times a day (BID) | ORAL | Status: DC | PRN
Start: 2018-07-16 — End: 2018-07-18
  Administered 2018-07-17: 0.25 mg via ORAL
  Filled 2018-07-16: qty 1

## 2018-07-16 MED ORDER — OXYCODONE HCL 5 MG PO TABS
10.0000 mg | ORAL_TABLET | Freq: Once | ORAL | Status: AC
  Administered 2018-07-16: 10 mg via ORAL
  Filled 2018-07-16: qty 2

## 2018-07-16 MED ORDER — ONDANSETRON HCL 4 MG/2ML IJ SOLN
4.0000 mg | Freq: Four times a day (QID) | INTRAMUSCULAR | Status: DC | PRN
  Administered 2018-07-17: 4 mg via INTRAVENOUS
  Filled 2018-07-16: qty 2

## 2018-07-16 MED ORDER — NITROGLYCERIN 2 % TD OINT
1.0000 [in_us] | TOPICAL_OINTMENT | Freq: Once | TRANSDERMAL | Status: AC
Start: 2018-07-16 — End: 2018-07-16
  Administered 2018-07-16: 1 [in_us] via TOPICAL
  Filled 2018-07-16: qty 1

## 2018-07-16 MED ORDER — ASPIRIN 81 MG PO CHEW
324.0000 mg | CHEWABLE_TABLET | Freq: Once | ORAL | Status: AC
  Administered 2018-07-16: 324 mg via ORAL
  Filled 2018-07-16: qty 4

## 2018-07-16 MED ORDER — ASPIRIN EC 325 MG PO TBEC
325.0000 mg | DELAYED_RELEASE_TABLET | Freq: Every day | ORAL | Status: DC
  Administered 2018-07-17 – 2018-07-18 (×2): 325 mg via ORAL
  Filled 2018-07-16 (×2): qty 1

## 2018-07-16 MED ORDER — INSULIN ASPART 100 UNIT/ML ~~LOC~~ SOLN
0.0000 [IU] | Freq: Every day | SUBCUTANEOUS | Status: DC
  Administered 2018-07-16: 2 [IU] via SUBCUTANEOUS
  Filled 2018-07-16: qty 1

## 2018-07-16 MED ORDER — AMLODIPINE BESYLATE 5 MG PO TABS
5.0000 mg | ORAL_TABLET | Freq: Every day | ORAL | Status: DC
  Administered 2018-07-17 – 2018-07-18 (×2): 5 mg via ORAL
  Filled 2018-07-16 (×2): qty 1

## 2018-07-16 MED ORDER — FUROSEMIDE 40 MG PO TABS
40.0000 mg | ORAL_TABLET | Freq: Every day | ORAL | Status: DC
  Administered 2018-07-16 – 2018-07-18 (×3): 40 mg via ORAL
  Filled 2018-07-16: qty 2
  Filled 2018-07-16 (×2): qty 1

## 2018-07-16 MED ORDER — CARVEDILOL 25 MG PO TABS
25.0000 mg | ORAL_TABLET | Freq: Two times a day (BID) | ORAL | Status: DC
  Administered 2018-07-16 – 2018-07-18 (×4): 25 mg via ORAL
  Filled 2018-07-16 (×3): qty 1
  Filled 2018-07-16: qty 2

## 2018-07-16 MED ORDER — SACUBITRIL-VALSARTAN 97-103 MG PO TABS
1.0000 | ORAL_TABLET | Freq: Two times a day (BID) | ORAL | Status: DC
Start: 2018-07-16 — End: 2018-07-18
  Administered 2018-07-16 – 2018-07-18 (×4): 1 via ORAL
  Filled 2018-07-16 (×5): qty 1

## 2018-07-16 MED ORDER — ACETAMINOPHEN 325 MG PO TABS
650.0000 mg | ORAL_TABLET | ORAL | Status: DC | PRN
  Administered 2018-07-18: 650 mg via ORAL
  Filled 2018-07-16: qty 2

## 2018-07-16 MED ORDER — ALBUTEROL SULFATE (2.5 MG/3ML) 0.083% IN NEBU: 3.0000 mL | INHALATION_SOLUTION | RESPIRATORY_TRACT | Status: DC | PRN

## 2018-07-16 NOTE — ED Provider Notes (Signed)
[facility] Emergency Department Provider Note MRN:  [id number]  Arrival date & time: 07/16/18     Chief Complaint   Chest Pain   History of Present Illness   Maureen Jackson is a 62 y.o. year-old female with a history of CAD presenting to the ED with chief complaint of chest pain.  At about 2 PM today, patient was sitting on the couch at rest when she experienced sudden onset lightheadedness, nausea, diaphoresis, followed shortly by pressure-like pain in the center of the chest.  The pain is nonradiating.  Also experienced some paresthesias of the right fingers.  The symptoms subsided after 30 to 45 minutes.  Currently symptomatic.  Denies fevers or cough, no abdominal pain, no dysuria.  Symptoms feel similar to prior cardiac events.  Review of Systems  A complete 10 system review of systems was obtained and all systems are negative except as noted in the HPI and PMH.   Patient's Health History    Past Medical History:  Diagnosis Date  . 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 Coronary artery disease 09/2017   stents placed  . Diabetes mellitus   . Dyspnea   . Glaucoma   . Hypertension   . Morbid obesity ([place])   . Obstructive sleep apnea    does not wear CPAP  . Thyroid disease     Past Surgical History:  Procedure Laterality Date  . ABDOMINAL HYSTERECTOMY    . CARDIAC CATHETERIZATION  09/12/2017  . CORONARY STENT INTERVENTION  09/12/2017   STENT RESOLUTE ONYX 1.02V25 drug eluting stent was successfully placed  . CORONARY STENT INTERVENTION N/A 09/12/2017   Procedure: CORONARY STENT INTERVENTION;  Surgeon: Leonie Man, MD;  Location: [place] [facility];  Service: Cardiovascular;  Laterality: N/A;  . LEFT HEART CATH AND CORONARY ANGIOGRAPHY N/A 09/12/2017   Procedure: LEFT HEART CATH AND CORONARY ANGIOGRAPHY;  Surgeon: Leonie Man, MD;  Location: [place] [facility];  Service: Cardiovascular;  Laterality: N/A;  . RIGHT/LEFT HEART CATH AND CORONARY  ANGIOGRAPHY N/A 09/10/2017   Procedure: RIGHT/LEFT HEART CATH AND CORONARY ANGIOGRAPHY;  Surgeon: Troy Sine, MD;  Location: [place] [facility];  Service: Cardiovascular;  Laterality: N/A;  . THYROID SURGERY      Family History  Problem Relation Age of Onset  . Diabetes Mother   . Hypertension Mother   . Cancer Mother        pancreas  . Hypertension Sister     Social History   Socioeconomic History  . Marital status: Widowed    Spouse name: Not on file  . Number of children: Not on file  . Years of education: Not on file  . Highest education level: Not on file  Occupational History  . 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 Financial resource strain: Not on file  . Food insecurity:    Worry: Not on file    Inability: Not on file  . Transportation needs:    Medical: Not on file    Non-medical: Not on file  Tobacco Use  . Smoking status: Former Smoker    Last attempt to quit: 12/14/2005    Years since quitting: 12.5  . Smokeless tobacco: Never Used  . Tobacco comment: QUIT IN 2005  Substance and Sexual Activity  . Alcohol use: No  . Drug use: No  . Sexual activity: Never  Lifestyle  . Physical activity:  Days per week: Not on file    Minutes per session: Not on file  . Stress: Not on file  Relationships  . Social connections:    Talks on phone: Not on file    Gets together: Not on file    Attends religious service: Not on file    Active member of club or organization: Not on file    Attends meetings of clubs or organizations: Not on file    Relationship status: Not on file  . Intimate partner violence:    Fear of current or ex partner: Not on file    Emotionally abused: Not on file    Physically abused: Not on file    Forced sexual activity: Not on file  Other Topics Concern  . Not on file  Social History Narrative  . Not on file     Physical Exam  Vital Signs and Nursing Notes reviewed Vitals:   07/16/18 2000  07/16/18 2209  BP: 138/88 129/72  Pulse: 62 62  Resp: 19 17  Temp:    SpO2: 98% 98%    CONSTITUTIONAL: Well-appearing, NAD NEURO:  Alert and oriented x 3, no focal deficits EYES:  eyes equal and reactive ENT/NECK:  no LAD, no JVD CARDIO: Regular rate, well-perfused, normal S1 and S2 PULM:  CTAB no wheezing or rhonchi GI/GU:  normal bowel sounds, non-distended, non-tender MSK/SPINE:  No gross deformities, no edema SKIN:  no rash, atraumatic PSYCH:  Appropriate speech and behavior  Diagnostic and Interventional Summary    EKG Interpretation  Date/Time:  Tuesday July 16 2018 17:45:13 EDT Ventricular Rate:  60 PR Interval:  140 QRS Duration: 156 QT Interval:  467 QTC Calculation: 467 R Axis:   76 Text Interpretation:  Sinus rhythm Nonspecific intraventricular conduction delay Probable anteroseptal infarct, recent Confirmed by Gerlene Fee [phone]) on 07/16/2018 5:48:15 PM      Labs Reviewed  BASIC METABOLIC PANEL - Abnormal; Notable for the following components:      Result Value   CO2 21 (*)    Glucose, Bld 218 (*)    Calcium 8.5 (*)    All other components within normal limits  CBG MONITORING, ED - Abnormal; Notable for the following components:   Glucose-Capillary 247 (*)    All other components within normal limits  CBC  RAPID URINE DRUG SCREEN, HOSP PERFORMED  HEMOGLOBIN A1C  LIPID PANEL  TROPONIN I  TROPONIN I  TROPONIN I  TSH  BRAIN NATRIURETIC PEPTIDE  I-STAT TROPONIN, ED  I-STAT BETA HCG BLOOD, ED ([facility], [facility], [facility] ONLY)  I-STAT TROPONIN, ED    DG Chest 2 View  Final Result      Medications  amLODipine (NORVASC) tablet 5 mg (has no administration in time range)  aspirin EC tablet 325 mg (has no administration in time range)  carvedilol (COREG) tablet 25 mg (25 mg Oral Given 07/16/18 2224)  clopidogrel (PLAVIX) tablet 75 mg (has no administration in time range)  furosemide (LASIX) tablet 40 mg (40 mg Oral Given 07/16/18 2054)  isosorbide mononitrate (IMDUR)  24 hr tablet 30 mg (has no administration in time range)  levothyroxine (SYNTHROID, LEVOTHROID) tablet 200 mcg (has no administration in time range)  sacubitril-valsartan (ENTRESTO) 97-103 mg per tablet (1 tablet Oral Given 07/16/18 2225)  spironolactone (ALDACTONE) tablet 12.5 mg (12.5 mg Oral Given 07/16/18 2226)  albuterol (PROVENTIL) (2.5 MG/3ML) 0.083% nebulizer solution 3 mL (has no administration in time range)  atorvastatin (LIPITOR) tablet 80 mg (has no administration in time  range)  nitroGLYCERIN (NITROSTAT) SL tablet 0.4 mg (has no administration in time range)  morphine 2 MG/ML injection 2 mg (has no administration in time range)  acetaminophen (TYLENOL) tablet 650 mg (has no administration in time range)  ondansetron (ZOFRAN) injection 4 mg (has no administration in time range)  enoxaparin (LOVENOX) injection 40 mg (40 mg Subcutaneous Given 07/16/18 2228)  zolpidem (AMBIEN) tablet 5 mg (has no administration in time range)  ALPRAZolam (XANAX) tablet 0.25 mg (has no administration in time range)  hydrALAZINE (APRESOLINE) injection 5 mg (has no administration in time range)  insulin aspart (novoLOG) injection 0-9 Units (has no administration in time range)  insulin aspart (novoLOG) injection 0-5 Units (2 Units Subcutaneous Given 07/16/18 2227)  aspirin chewable tablet 324 mg (324 mg Oral Given 07/16/18 1807)  nitroGLYCERIN (NITROGLYN) 2 % ointment 1 inch (1 inch Topical Given 07/16/18 1814)  oxyCODONE (Oxy IR/ROXICODONE) immediate release tablet 10 mg (10 mg Oral Given 07/16/18 1806)     Procedures Critical Care  ED Course and Medical Decision Making  I have reviewed the triage vital signs and the nursing notes.  Pertinent labs & imaging results that were available during my care of the patient were reviewed by me and considered in my medical decision making (see below for details).    62 year old female history of CAD and stents presenting with typical chest pain at rest.  Concern for  unstable angina.  Vital signs stable, pain was resolved but returned in the ED, repeat EKG with no ischemic changes.  Admitted to hospitalist service for further care and evaluation.  Barth Kirks. [place]onia Small, [place] [email or web address]  Final Clinical Impressions(s) / ED Diagnoses     ICD-10-CM   1. Unstable angina pectoris [facility]) I20.0     ED Discharge Orders    None         Maudie Flakes, MD 07/16/18 2241

## 2018-07-16 NOTE — ED Notes (Signed)
Pt started having chest pain again, repeat EKG done and given to EDP

## 2018-07-16 NOTE — H&P (Signed)
History and Physical    Maureen Jackson SPQ:[id number] DOB: 20-Aug-1956 DOA: 07/16/2018  Referring MD/NP/PA:   PCP: Neale Burly, MD   Patient coming from:  The patient is coming from home.  At baseline, pt is independent for most of ADL.   Chief Complaint: chest pain  HPI: Maureen Jackson is a 62 y.o. female with medical history significant of CHF with EF 30%, hypertension, hyperlipidemia, diabetes mellitus, TIA, hypothyroidism, CAD, stent placement, obesity, OSA not on CPAP, who presents with chest pain.  Patient states that she started having sudden onset of chest pain at about 10 AM.  It was located in the substernal area, initially 5 out of 10 in severity, then subsided after ~30 min, pressure-like, radiating to the right hand, associated with nausea. Also experienced some paresthesias of the right fingers. No shortness of breath.  No recent long distant traveling, no tenderness in the calf area.  Patient has mild dry cough, but no fever or chills.  Patient denies vomiting, diarrhea or abdominal pain pain no symptoms of UTI or unilateral weakness. She had another episode of similar chest pain in the late afternoon.  Currently patient is chest pain-free.  No shortness of breath.  Cardiac  Cath on 09/12/17:  LESION #1  Prox LAD lesion, 60 %stenosed. Prox LAD to Mid LAD lesion, 65 %stenosed. Mid LAD lesion, 85 %stenosed. Tandem Lesions.  A STENT RESOLUTE ONYX [id number] drug eluting stent was successfully placed. Post-dilated in tapered fashion: 3.6 - 2.9 mm  Post intervention, there is a 0% residual stenosis.  LESION $2  Prox RCA lesion, 45 %stenosed. Prox RCA to Mid RCA lesion, 70 %stenosed. Mid RCA lesion, 90 %stenosed. Tandem Lesions  A STENT RESOLUTE ONYX [id number] drug eluting stent and STENT RESOLUTE ONYX [id number] drug-eluting stent were successfully placed in overlapping fashion.  Post intervention, there is a 0% residual stenosis. Post-dilated in tapered fashion: 3.6 - 3.1  mm  RPDA lesion, 80 %stenosed. Plan Medical management.   ED Course: pt was found to have negative troponin, electrolytes renal function okay, temperature normal, no tachycardia, oxygen saturation 96% on room air, chest x-ray showed cardiomegaly and vascular congestion, no obvious pulmonary edema.  Patient is placed on telemetry bed of observation.  Review of Systems:   General: no fevers, chills, no body weight gain, has fatigue HEENT: no blurry vision, hearing changes or sore throat Respiratory: no dyspnea, has coughing, no wheezing CV: has chest pain, no palpitations GI: has nausea, no vomiting, abdominal pain, diarrhea, constipation GU: no dysuria, burning on urination, increased urinary frequency, hematuria  Ext: has mild leg edema Neuro: no unilateral weakness, numbness, or tingling, no vision change or hearing loss. Had tingling in right fingers. Skin: no rash, no skin tear. MSK: No muscle spasm, no deformity, no limitation of range of movement in spin Heme: No easy bruising.  Travel history: No recent long distant travel.  Allergy:  Allergies  Allergen Reactions  . Aspirin Nausea Only    Past Medical History:  Diagnosis Date  . 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 Coronary artery disease 09/2017   stents placed  . Diabetes mellitus   . Dyspnea   . Glaucoma   . Hypertension   . Morbid obesity ([place])   . Obstructive sleep apnea    does not wear CPAP  . Thyroid disease     Past Surgical History:  Procedure Laterality Date  . ABDOMINAL HYSTERECTOMY    . CARDIAC CATHETERIZATION  09/12/2017  . CORONARY STENT INTERVENTION  09/12/2017   STENT RESOLUTE ONYX [id number] drug eluting stent was successfully placed  . CORONARY STENT INTERVENTION N/A 09/12/2017   Procedure: CORONARY STENT INTERVENTION;  Surgeon: Leonie Man, MD;  Location: [facility];  Service: Cardiovascular;  Laterality: N/A;  . LEFT HEART CATH AND CORONARY ANGIOGRAPHY N/A 09/12/2017   Procedure: LEFT  HEART CATH AND CORONARY ANGIOGRAPHY;  Surgeon: Leonie Man, MD;  Location: [facility];  Service: Cardiovascular;  Laterality: N/A;  . RIGHT/LEFT HEART CATH AND CORONARY ANGIOGRAPHY N/A 09/10/2017   Procedure: RIGHT/LEFT HEART CATH AND CORONARY ANGIOGRAPHY;  Surgeon: Troy Sine, MD;  Location: [facility];  Service: Cardiovascular;  Laterality: N/A;  . THYROID SURGERY      Social History:  reports that she quit smoking about 12 years ago. She has never used smokeless tobacco. She reports that she does not drink alcohol or use drugs.  Family History:  Family History  Problem Relation Age of Onset  . Diabetes Mother   . Hypertension Mother   . Cancer Mother        pancreas  . Hypertension Sister      Prior to Admission medications   Medication Sig Start Date End Date Taking? Authorizing Provider  amLODipine (NORVASC) 5 MG tablet Take 1 tablet (5 mg total) by mouth daily. 11/07/17  Yes [name], PA-C  aspirin EC 325 MG tablet Take 1 tablet (325 mg total) by mouth daily. 03/15/18  Yes Barton Dubois, MD  carvedilol (COREG) 25 MG tablet Take 1 tablet (25 mg total) by mouth 2 (two) times daily. 11/07/17 07/16/18 Yes [name], PA-C  clopidogrel (PLAVIX) 75 MG tablet Take 1 tablet (75 mg total) by mouth daily with breakfast. 11/07/17  Yes [name], PA-C  furosemide (LASIX) 40 MG tablet Take 1 tablet (40 mg total) by mouth daily. 09/14/17  Yes Ghimire, Henreitta Leber, MD  isosorbide mononitrate (IMDUR) 30 MG 24 hr tablet Take 1 tablet (30 mg total) by mouth daily. 11/07/17  Yes [name], PA-C  JARDIANCE 10 MG TABS tablet Take 10 mg by mouth daily. 03/15/18  Yes [provider]  levothyroxine (SYNTHROID, LEVOTHROID) 200 MCG tablet TAKE 200MCG BY MOUTH DAILY 10/15/17  Yes [provider]  sacubitril-valsartan (ENTRESTO) 97-103 MG Take 1 tablet by mouth 2 (two) times daily. 03/14/18  Yes BranchAlphonse Guild, MD  spironolactone (ALDACTONE) 25  MG tablet Take 0.5 tablets (12.5 mg total) by mouth daily. 04/18/18  Yes Branch, Alphonse Guild, MD  VENTOLIN HFA 108 [phone] Base) MCG/ACT inhaler Inhale 1-2 puffs into the lungs every 6 (six) hours as needed for wheezing or shortness of breath. Patient taking differently: Inhale 1-2 puffs into the lungs as needed for wheezing or shortness of breath.  09/14/17  Yes Ghimire, Henreitta Leber, MD  atorvastatin (LIPITOR) 80 MG tablet Take 1 tablet (80 mg total) by mouth daily at 6 PM. Patient not taking: Reported on 04/15/2018 11/14/17   [name], PA-C  meclizine (ANTIVERT) 25 MG tablet Take 1 tablet (25 mg total) by mouth 3 (three) times daily as needed for dizziness. Patient not taking: Reported on 07/16/2018 03/14/18   Arnoldo Lenis, MD  [facility] VERIO test strip USE TO TEST BLOOD SUGAR BID 01/05/18   [provider]    Physical Exam: Vitals:   07/16/18 1700 07/16/18 1730 07/16/18 1800 07/16/18 1830  BP: (!) 147/57 136/65 (!) 155/75 (!) 150/83  Pulse: 69 60 69 61  Resp: 20 18 15 20   Temp:      TempSrc:      SpO2: 100% 100% 100% 100%   General: Not in acute distress HEENT:       Eyes: PERRL, EOMI, no scleral icterus.       ENT: No discharge from the ears and nose, no pharynx injection, no tonsillar enlargement.        Neck: No JVD, no bruit, no mass felt. Heme: No neck lymph node enlargement. Cardiac: S1/S2, RRR, No murmurs, No gallops or rubs. Respiratory: No rales, wheezing, rhonchi or rubs. GI: Soft, nondistended, nontender, no rebound pain, no organomegaly, BS present. GU: No hematuria Ext: has trace edema bilaterally. 2+DP/PT pulse bilaterally. Musculoskeletal: No joint deformities, No joint redness or warmth, no limitation of ROM in spin. Skin: No rashes.  Neuro: Alert, oriented X3, cranial nerves II-XII grossly intact, moves all extremities normally.  Psych: Patient is not psychotic, no suicidal or hemocidal ideation.  Labs on Admission: I have personally reviewed following  labs and imaging studies  CBC: Recent Labs  Lab 07/16/18 1442  WBC 7.4  HGB 12.3  HCT 39.1  MCV 84.8  PLT 269   Basic Metabolic Panel: Recent Labs  Lab 07/16/18 1442  NA 139  K 4.4  CL 108  CO2 21*  GLUCOSE 218*  BUN 18  CREATININE 0.96  CALCIUM 8.5*   GFR: CrCl cannot be calculated (Unknown ideal weight.). Liver Function Tests: No results for input(s): AST, ALT, ALKPHOS, BILITOT, PROT, ALBUMIN in the last 168 hours. No results for input(s): LIPASE, AMYLASE in the last 168 hours. No results for input(s): AMMONIA in the last 168 hours. Coagulation Profile: No results for input(s): INR, PROTIME in the last 168 hours. Cardiac Enzymes: No results for input(s): CKTOTAL, CKMB, CKMBINDEX, TROPONINI in the last 168 hours. BNP (last 3 results) No results for input(s): PROBNP in the last 8760 hours. HbA1C: No results for input(s): HGBA1C in the last 72 hours. CBG: No results for input(s): GLUCAP in the last 168 hours. Lipid Profile: No results for input(s): CHOL, HDL, LDLCALC, TRIG, CHOLHDL, LDLDIRECT in the last 72 hours. Thyroid Function Tests: No results for input(s): TSH, T4TOTAL, FREET4, T3FREE, THYROIDAB in the last 72 hours. Anemia Panel: No results for input(s): VITAMINB12, FOLATE, FERRITIN, TIBC, IRON, RETICCTPCT in the last 72 hours. Urine analysis:    Component Value Date/Time   COLORURINE COLORLESS (A) 03/14/2018 [place] 03/14/2018 1645   LABSPEC 1.006 03/14/2018 1645   PHURINE 8.0 03/14/2018 1645   GLUCOSEU >=500 (A) 03/14/2018 1645   HGBUR SMALL (A) 03/14/2018 [place] 03/14/2018 1645   KETONESUR NEGATIVE 03/14/2018 1645   PROTEINUR 30 (A) 03/14/2018 1645   UROBILINOGEN 0.2 10/02/2015 2320   NITRITE NEGATIVE 03/14/2018 1645   LEUKOCYTESUR NEGATIVE 03/14/2018 1645   Sepsis Labs: @LABRCNTIP (procalcitonin:4,lacticidven:4) )No results found for this or any previous visit (from the past 240 hour(s)).   Radiological  Exams on Admission: Dg Chest 2 View  Result Date: 07/16/2018 CLINICAL DATA:  Central chest pain with nausea for 1.5 hours EXAM: CHEST - 2 VIEW COMPARISON:  09/05/2017 FINDINGS: Cardiopericardial enlargement that appears less prominent than before. Generalized prominent vessels. No Kerley lines, effusion, or pneumothorax. Mild linear opacity in the left mid lung from atelectasis or scarring. No acute osseous finding. Thyroidectomy clips. IMPRESSION: Cardiomegaly with borderline vascular congestion. No pulmonary edema. Electronically Signed   By: Neva Seat.D.  On: 07/16/2018 14:59     EKG: Independently reviewed.  Sinus rhythm, QTC 467, low voltage, anteroseptal infarction pattern, ST depression V5-V6, widening QRS which is seen in previous EKG.   Assessment/Plan Principal Problem:   Chest pain Active Problems:   Hypothyroidism   Essential hypertension   Diabetes mellitus type 2 with complications, uncontrolled (HCC)   CAD, multiple vessel   TIA (transient ischemic attack)   Chronic combined systolic and diastolic CHF (congestive heart failure) (HCC)   Hyperlipidemia   Chest pain and hx of CAD: s/p of stent placement. Pt had 2 episodes of chest pain. Trop negative, EKG has no significant change compared with previous EKG.  Patient does not have signs of DVT, no recent long distant traveling, less likely to have PE.  - will place on Tele bed for obs - cycle CE q6 x3 and repeat EKG in the am  - prn Nitroglycerin, Morphine, and aspirin, coreg, imdure - restart lipitor (pt is not taking lipitor at home) - Risk factor stratification: will check FLP,UDS and A1C  - did not order 2d echo--> please reevaluate pt in AM to decide if pt needs 2D echo. - inpt card consult was requested via Epic  Hypothyroidism: Last TSH was 14.3 on 09/05/17, not at goal -Continue home Synthroid -Check TSH  HTN:  -Continue home medications: Entresto, amlodipine, Coreg -Patient is also Lasix,  spironolactone -IV hydralazine prn  Diabetes mellitus type 2 with complications, uncontrolled ([place]): Last A1c 9.3 on 03/15/18, poorly controled. Patient is taking Jardiance at home -SSI  Hx of TIA (transient ischemic attack): -Plavix, ASA and lipitor  Chronic combined systolic and diastolic CHF (congestive heart failure) ([place]): 2D echo on 09/18/2017 showed EF 30 to 35%.  Patient has trace leg edema, no JVD.  No respiratory distress.  No pulmonary edema on chest x-ray.  CHF seems to be compensated. -Continue home dose Lasix, Entresto, spironolactone -check BNP  Hyperlipidemia: pt is supposed to take lipitor, not taking it currently -resume lipitor   DVT ppx:  SQ Lovenox Code Status: Full code Family Communication:  Yes, patient's sister  at bed side Disposition Plan:  Anticipate discharge back to previous home environment Consults called:  none Admission status: Obs / tele    Date of Service 07/16/2018    Ivor Costa [facility] Pager [phone]  If 7PM-7AM, please contact night-coverage [email or web address] Password [facility] 07/16/2018, 7:38 PM

## 2018-07-16 NOTE — ED Notes (Signed)
Given graham crackers and water, ok by edp

## 2018-07-16 NOTE — ED Triage Notes (Signed)
Pt reports she has episodes of diaphoresis, nausea and her chest feels tight.

## 2018-07-16 NOTE — ED Notes (Signed)
ED Provider at bedside. 

## 2018-07-16 NOTE — ED Notes (Signed)
[name] (sister) [phone]

## 2018-07-17 ENCOUNTER — Other Ambulatory Visit: Payer: Self-pay

## 2018-07-17 ENCOUNTER — Ambulatory Visit: Payer: Medicare HMO | Admitting: Cardiology

## 2018-07-17 DIAGNOSIS — E039 Hypothyroidism, unspecified: Secondary | ICD-10-CM | POA: Diagnosis not present

## 2018-07-17 DIAGNOSIS — E785 Hyperlipidemia, unspecified: Secondary | ICD-10-CM | POA: Diagnosis not present

## 2018-07-17 DIAGNOSIS — I10 Essential (primary) hypertension: Secondary | ICD-10-CM | POA: Diagnosis not present

## 2018-07-17 DIAGNOSIS — I5042 Chronic combined systolic (congestive) and diastolic (congestive) heart failure: Secondary | ICD-10-CM | POA: Diagnosis not present

## 2018-07-17 DIAGNOSIS — I2510 Atherosclerotic heart disease of native coronary artery without angina pectoris: Secondary | ICD-10-CM | POA: Diagnosis not present

## 2018-07-17 DIAGNOSIS — E1165 Type 2 diabetes mellitus with hyperglycemia: Secondary | ICD-10-CM | POA: Diagnosis not present

## 2018-07-17 DIAGNOSIS — R079 Chest pain, unspecified: Secondary | ICD-10-CM | POA: Diagnosis not present

## 2018-07-17 DIAGNOSIS — E118 Type 2 diabetes mellitus with unspecified complications: Secondary | ICD-10-CM | POA: Diagnosis not present

## 2018-07-17 LAB — TSH: TSH: 0.181 u[IU]/mL — ABNORMAL LOW (ref 0.350–4.500)

## 2018-07-17 LAB — RAPID URINE DRUG SCREEN, HOSP PERFORMED
AMPHETAMINES: NOT DETECTED
Barbiturates: NOT DETECTED
Benzodiazepines: NOT DETECTED
Cocaine: NOT DETECTED
Opiates: NOT DETECTED
TETRAHYDROCANNABINOL: NOT DETECTED

## 2018-07-17 LAB — TROPONIN I: Troponin I: <0.03 ng/mL (ref <0.03)

## 2018-07-17 LAB — BASIC METABOLIC PANEL
Anion gap: 11 (ref 5–15)
BUN: 21 mg/dL — AB (ref 6–20)
CO2: 23 mmol/L (ref 22–32)
CREATININE: 1.26 mg/dL — AB (ref 0.44–1.00)
Calcium: 8.3 mg/dL — ABNORMAL LOW (ref 8.9–10.3)
Chloride: 106 mmol/L (ref 98–111)
GFR calc Af Amer: 53 mL/min — ABNORMAL LOW (ref >60)
GFR calc non Af Amer: 45 mL/min — ABNORMAL LOW (ref >60)
GLUCOSE: 241 mg/dL — AB (ref 70–99)
Potassium: 4 mmol/L (ref 3.5–5.1)
Sodium: 140 mmol/L (ref 135–145)

## 2018-07-17 LAB — LIPID PANEL
CHOLESTEROL: 147 mg/dL (ref 0–200)
HDL: 31 mg/dL — ABNORMAL LOW (ref >40)
LDL Cholesterol: 83 mg/dL (ref 0–99)
Total CHOL/HDL Ratio: 4.7 RATIO
Triglycerides: 167 mg/dL — ABNORMAL HIGH (ref <150)
VLDL: 33 mg/dL (ref 0–40)

## 2018-07-17 LAB — HEMOGLOBIN A1C
HEMOGLOBIN A1C: 10.4 % — AB (ref 4.8–5.6)
MEAN PLASMA GLUCOSE: 251.78 mg/dL

## 2018-07-17 LAB — GLUCOSE, CAPILLARY
GLUCOSE-CAPILLARY: 256 mg/dL — AB (ref 70–99)
Glucose-Capillary: 234 mg/dL — ABNORMAL HIGH (ref 70–99)
Glucose-Capillary: 156 mg/dL — ABNORMAL HIGH (ref 70–99)
Glucose-Capillary: 229 mg/dL — ABNORMAL HIGH (ref 70–99)

## 2018-07-17 MED ORDER — INSULIN ASPART 100 UNIT/ML ~~LOC~~ SOLN
0.0000 [IU] | Freq: Every day | SUBCUTANEOUS | Status: DC
  Administered 2018-07-17: 2 [IU] via SUBCUTANEOUS

## 2018-07-17 MED ORDER — LEVOTHYROXINE SODIUM 75 MCG PO TABS
175.0000 ug | ORAL_TABLET | Freq: Every day | ORAL | Status: DC
  Administered 2018-07-18: 175 ug via ORAL
  Filled 2018-07-17: qty 1

## 2018-07-17 MED ORDER — LEVOTHYROXINE SODIUM 100 MCG PO TABS
200.0000 ug | ORAL_TABLET | Freq: Every day | ORAL | Status: DC
  Administered 2018-07-17: 200 ug via ORAL
  Filled 2018-07-17: qty 2

## 2018-07-17 MED ORDER — INSULIN GLARGINE 100 UNIT/ML ~~LOC~~ SOLN
16.0000 [IU] | Freq: Every day | SUBCUTANEOUS | Status: DC
  Administered 2018-07-17 – 2018-07-18 (×2): 16 [IU] via SUBCUTANEOUS
  Filled 2018-07-17 (×2): qty 0.16

## 2018-07-17 MED ORDER — ISOSORBIDE MONONITRATE ER 60 MG PO TB24
60.0000 mg | ORAL_TABLET | Freq: Every day | ORAL | Status: DC
  Administered 2018-07-18: 60 mg via ORAL
  Filled 2018-07-17: qty 1

## 2018-07-17 MED ORDER — INSULIN ASPART 100 UNIT/ML ~~LOC~~ SOLN
0.0000 [IU] | Freq: Three times a day (TID) | SUBCUTANEOUS | Status: DC
  Administered 2018-07-17: 8 [IU] via SUBCUTANEOUS
  Administered 2018-07-18: 2 [IU] via SUBCUTANEOUS
  Administered 2018-07-18: 8 [IU] via SUBCUTANEOUS

## 2018-07-17 NOTE — Progress Notes (Signed)
patient food was cold Reordered meal for tonight

## 2018-07-17 NOTE — Progress Notes (Signed)
Pt around midnight, pt complained of some numbness in her left fingers. EKG and vital signs were completed. She also complained of nausea. Zofran was also given. Pt reported afterwards that she felt a general relief and was able to rest. NP on call was informed. Will continue to monitor.

## 2018-07-17 NOTE — Progress Notes (Addendum)
   07/17/18 2213  Vitals  BP (!) 100/44  MAP (mmHg) (!) 61  BP Location Left Arm  BP Method Automatic  Patient Position (if appropriate) Lying  Pulse Rate [place] notified. Awaiting order on  whether to hold or give entresto and coreg for tonight.  Care note received to page cardiology for further intructions Cardiology paged.  Verbal order received from Cardiologist on call to give dose of entresto and coreg. Will continue to monitor patient.

## 2018-07-17 NOTE — Progress Notes (Addendum)
Inpatient Diabetes Program Recommendations  AACE/ADA: New Consensus Statement on Inpatient Glycemic Control (2015)  Target Ranges:  Prepandial:   less than 140 mg/dL      Peak postprandial:   less than 180 mg/dL (1-2 hours)      Critically ill patients:  140 - 180 mg/dL   Lab Results  Component Value Date   GLUCAP 234 (H) 07/17/2018   HGBA1C 10.4 (H) 07/17/2018    Review of Glycemic Control Results for Maureen Jackson, Maureen Jackson (MRN [id number]) as of 07/17/2018 09:44  Ref. Range 03/15/2018 07:27 03/15/2018 11:59 07/16/2018 22:11 07/17/2018 07:46  Glucose-Capillary Latest Ref Range: 70 - 99 mg/dL 214 (H) 225 (H) 247 (H) 234 (H)   Diabetes history: Type 2 DM Outpatient Diabetes medications: Jardiance 18m QD Current orders for Inpatient glycemic control: Novolog 0-9 units TID, Novolog 0-5 units QHS  Inpatient Diabetes Program Recommendations:    Recommending Lantus 16 units QHS. Will plan to see patient today.  Thanks, LBronson Curb MSN, RNC-OB Diabetes Coordinator [phone](8a-5p)  Addendum _0 : Spoke with patient regarding diabetes management outpatient. Patient verified home medication of Jardiance, was taken off of Metformin due to GI side effects.   Reviewed patient's current A1c of 10.4%. Explained what a A1c is and what it measures. Also reviewed goal A1c with patient, importance of good glucose control @ home, and blood sugar goals. Explained patho of DM, need for insulin, vascular changes that occur from poor glycemic control and long term comorbidites.  Patient has a meter and was checking 2-3 times per day, but had "slacked off". Encouraged to begin checking 2-3 times per day and keeping a diary of dietary intake. Informed of ADA recommendations of increasing activity as appropriate. Patient discussed that she enjoys walking and getting out into the community. Feels that she could increase length of time spent walking everyday. Reports, "I feel better after increasing my steps."  Praise for previous work and encouraged to increase. Educated on hormonal benefits and how this will impact BS.  Patient admits to drinking multiple sodas everyday. Discussed modifications and how this would help blood sugar. Briefly, discussed carb counting and amount of carbs per meal. Will place dietitian consult.  Also, needs additional support at discharge. [facility].  Educated patient and sister on insulin pen use at home. Reviewed contents of insulin flexpen starter kit. Reviewed all steps if insulin pen including attachment of needle, 2-unit air shot, dialing up dose, giving injection, removing needle, disposal of sharps, storage of unused insulin, disposal of insulin etc. Patient was unable to provide successful return demonstration. Also reviewed troubleshooting with insulin pen. MD to give patient Rxs for insulin pens and insulin pen needles. Will need additional review in the AM.  Patient plans to follow up with PCP on 07/29/18 per patient. At this time patient will need reinforcement and work with CBGs and self injections.   _1 :Discussed plan of care with Dr VEliseo Squires Patient to be discharged on insulin per insulin pen. Will continue to work towards education, self administration, survival skills and interventions.

## 2018-07-17 NOTE — Consult Note (Addendum)
Cardiology Consultation:   Patient ID: Maureen Jackson; [id number]; 24-Apr-1956   Admit date: 07/16/2018 Date of Consult: 07/17/2018  Primary Care Provider: Neale Burly, MD Primary Cardiologist: Dr. Harl Bowie   Patient Profile:   Maureen Jackson is a 62 y.o. female with a hx of CAD status post DES to the LAD and DES to the RCA x2, circumflex disease managed medically with lifelong Plavix recommended on cath 09/2017, ischemic cardiomyopathy ejection fraction 30-35% with grade1 DD echo 08/2017, moderate pericardial effusion on echo 09/2017 stable by serial imaging andsevere hypothyroidism who is being seen today for the evaluation of chest pain at the request of Dr. Blaine Hamper.  History of Present Illness:   Maureen Jackson is a 62 year old female with a history stated above who presented to [facility] on 07/16/2018 with complaints of sudden onset of substernal pressure-like chest pain rated a 5/10 in severity with radiation to her right hand with paresthesias which started approximately 10 AM on day of admission. She reports that she had associated nausea and diaphoresis however, no vomiting.  She denies shortness of breath, dizziness, palpitations, orthopnea, fatigue, LE swelling or syncope.  She states that the symptoms are similar however to a lesser degree to that which she experienced with her last MI in 2018.  She reports significant recent stress.  She denies daily weights however, she reports medication compliance.  In the ED, troponin levels negative x3 (<0.03, <0.03, <0.03).  CXR with cardiomegaly and borderline vascular congestion with no evidence of pulmonary edema.  An HbA1c noted to be 10.4. TSH markedly low at 0.181 however, this is a chronic issue and she is followed by her PCP per chart review.  BNP normal at 50.2.   Of note, she was last seen in the office on 04/15/2018 for follow-up and doing well without anginal or heart failure symptoms. During her prior office visit on 03/14/2018, her blood pressure  was noted to be elevated therefore, her Delene Loll was increased with close follow up of her renal function.   Cardiology has been asked to evaluate given her prior history of CAD.  Past Medical History:  Diagnosis Date  . 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 Coronary artery disease 09/2017   stents placed  . Diabetes mellitus   . Dyspnea   . Glaucoma   . Hypertension   . Morbid obesity ([place])   . Obstructive sleep apnea    does not wear CPAP  . Thyroid disease     Past Surgical History:  Procedure Laterality Date  . ABDOMINAL HYSTERECTOMY    . CARDIAC CATHETERIZATION  09/12/2017  . CORONARY STENT INTERVENTION  09/12/2017   STENT RESOLUTE ONYX 9.37T02 drug eluting stent was successfully placed  . CORONARY STENT INTERVENTION N/A 09/12/2017   Procedure: CORONARY STENT INTERVENTION;  Surgeon: Leonie Man, MD;  Location: [facility];  Service: Cardiovascular;  Laterality: N/A;  . LEFT HEART CATH AND CORONARY ANGIOGRAPHY N/A 09/12/2017   Procedure: LEFT HEART CATH AND CORONARY ANGIOGRAPHY;  Surgeon: Leonie Man, MD;  Location: [place] [facility];  Service: Cardiovascular;  Laterality: N/A;  . RIGHT/LEFT HEART CATH AND CORONARY ANGIOGRAPHY N/A 09/10/2017   Procedure: RIGHT/LEFT HEART CATH AND CORONARY ANGIOGRAPHY;  Surgeon: Troy Sine, MD;  Location: [facility] [facility];  Service: Cardiovascular;  Laterality: N/A;  . THYROID SURGERY       Prior to Admission medications   Medication Sig Start Date End Date Taking? Authorizing Provider  amLODipine (NORVASC) 5 MG tablet Take 1 tablet (5 mg total) by mouth daily. 11/07/17  Yes [name], PA-C  aspirin EC 325 MG tablet Take 1 tablet (325 mg total) by mouth daily. 03/15/18  Yes Barton Dubois, MD  carvedilol (COREG) 25 MG tablet Take 1 tablet (25 mg total) by mouth 2 (two) times daily. 11/07/17 07/16/18 Yes [name], PA-C  clopidogrel (PLAVIX) 75 MG tablet Take 1 tablet (75 mg total) by mouth daily with breakfast.  11/07/17  Yes [name], PA-C  furosemide (LASIX) 40 MG tablet Take 1 tablet (40 mg total) by mouth daily. 09/14/17  Yes Ghimire, Henreitta Leber, MD  isosorbide mononitrate (IMDUR) 30 MG 24 hr tablet Take 1 tablet (30 mg total) by mouth daily. 11/07/17  Yes [name], PA-C  JARDIANCE 10 MG TABS tablet Take 10 mg by mouth daily. 03/15/18  Yes [provider]  levothyroxine (SYNTHROID, LEVOTHROID) 200 MCG tablet TAKE 200MCG BY MOUTH DAILY 10/15/17  Yes [provider]  sacubitril-valsartan (ENTRESTO) 97-103 MG Take 1 tablet by mouth 2 (two) times daily. 03/14/18  Yes BranchAlphonse Guild, MD  spironolactone (ALDACTONE) 25 MG tablet Take 0.5 tablets (12.5 mg total) by mouth daily. 04/18/18  Yes Branch, Alphonse Guild, MD  VENTOLIN HFA 108 [id number] Base) MCG/ACT inhaler Inhale 1-2 puffs into the lungs every 6 (six) hours as needed for wheezing or shortness of breath. Patient taking differently: Inhale 1-2 puffs into the lungs as needed for wheezing or shortness of breath.  09/14/17  Yes Ghimire, Henreitta Leber, MD  atorvastatin (LIPITOR) 80 MG tablet Take 1 tablet (80 mg total) by mouth daily at 6 PM. Patient not taking: Reported on 04/15/2018 11/14/17   [name], PA-C  meclizine (ANTIVERT) 25 MG tablet Take 1 tablet (25 mg total) by mouth 3 (three) times daily as needed for dizziness. Patient not taking: Reported on 07/16/2018 03/14/18   Arnoldo Lenis, MD  [facility] VERIO test strip USE TO TEST BLOOD SUGAR BID 01/05/18   [provider]    Inpatient Medications: Scheduled Meds: . amLODipine  5 mg Oral Daily  . aspirin EC  325 mg Oral Daily  . atorvastatin  80 mg Oral q1800  . carvedilol  25 mg Oral BID  . clopidogrel  75 mg Oral Q breakfast  . enoxaparin (LOVENOX) injection  40 mg Subcutaneous Q24H  . furosemide  40 mg Oral Daily  . insulin aspart  0-5 Units Subcutaneous QHS  . insulin aspart  0-9 Units Subcutaneous TID WC  . isosorbide mononitrate  30 mg Oral Daily  .  levothyroxine  200 mcg Oral QAC breakfast  . sacubitril-valsartan  1 tablet Oral BID  . spironolactone  12.5 mg Oral Daily   Continuous Infusions:  PRN Meds: acetaminophen, albuterol, ALPRAZolam, hydrALAZINE, morphine injection, nitroGLYCERIN, ondansetron (ZOFRAN) IV, zolpidem  Allergies:    Allergies  Allergen Reactions  . Aspirin Nausea Only    Social History:   Social History   Socioeconomic History  . Marital status: Widowed    Spouse name: Not on file  . Number of children: Not on file  . Years of education: Not on file  . Highest education level: Not on file  Occupational History  . 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 Financial resource strain: Not on file  . Food insecurity:    Worry: Not on file    Inability: Not on file  . Transportation needs:  Medical: Not on file    Non-medical: Not on file  Tobacco Use  . Smoking status: Former Smoker    Last attempt to quit: 12/14/2005    Years since quitting: 12.5  . Smokeless tobacco: Never Used  . Tobacco comment: QUIT IN 2005  Substance and Sexual Activity  . Alcohol use: No  . Drug use: No  . Sexual activity: Never  Lifestyle  . Physical activity:    Days per week: Not on file    Minutes per session: Not on file  . Stress: Not on file  Relationships  . Social connections:    Talks on phone: Not on file    Gets together: Not on file    Attends religious service: Not on file    Active member of club or organization: Not on file    Attends meetings of clubs or organizations: Not on file    Relationship status: Not on file  . Intimate partner violence:    Fear of current or ex partner: Not on file    Emotionally abused: Not on file    Physically abused: Not on file    Forced sexual activity: Not on file  Other Topics Concern  . Not on file  Social History Narrative  . Not on file    Family History:   Family History  Problem Relation Age of Onset  . Diabetes Mother    . Hypertension Mother   . Cancer Mother        pancreas  . Hypertension Sister    Family Status:  Family Status  Relation Name Status  . Mother  Deceased  . Sister  Alive  . Father  Deceased    ROS:  Please see the history of present illness.  All other ROS reviewed and negative.     Physical Exam/Data:   Vitals:   07/17/18 0406 07/17/18 0412 07/17/18 0747 07/17/18 1149  BP:  (!) 110/52 124/62 (!) 128/58  Pulse:  65 60 61  Resp:  18 18 16   Temp:  (!) 97.3 F (36.3 C) 98.2 F (36.8 C) 98 F (36.7 C)  TempSrc:  Oral Oral Oral  SpO2:  98% 99% 99%  Weight: (!) 308 lb 8 oz (139.9 kg)     Height:        Intake/Output Summary (Last 24 hours) at 07/17/2018 1216 Last data filed at 07/17/2018 1050 Gross per 24 hour  Intake 720 ml  Output 2200 ml  Net -1480 ml   Filed Weights   07/17/18 0106 07/17/18 0406  Weight: (!) 307 lb 14.4 oz (139.7 kg) (!) 308 lb 8 oz (139.9 kg)   Body mass index is 51.34 kg/m.   General: Obese, NAD Skin: Warm, dry, intact  Head: Normocephalic, atraumatic, clear, moist mucus membranes. Neck: Negative for carotid bruits. No JVD Lungs:Clear to ausculation bilaterally. No wheezes, rales, or rhonchi. Breathing is unlabored. Cardiovascular: RRR with S1 S2. No murmurs, rubs or gallops Abdomen: Soft, non-tender, non-distended with normoactive bowel sounds. No obvious abdominal masses. MSK: Strength and tone appear normal for age. 5/5 in all extremities Extremities: 1+ LE edema. No clubbing or cyanosis. DP/PT pulses 1+ bilaterally Neuro: Alert and oriented. No focal deficits. No facial asymmetry. MAE spontaneously. Psych: Responds to questions appropriately with normal affect.     EKG:  The EKG was personally reviewed and demonstrates: 07/17/18 NSR with LBBB (old) Telemetry:  Telemetry was personally reviewed and demonstrates:  07/17/18 NSR   Relevant CV Studies:  10/2018diagnosticcath  Prox RCA lesion, 30 %stenosed.  Prox RCA to Mid RCA  lesion, 70 %stenosed.  Mid RCA lesion, 90 %stenosed.  RPDA lesion, 80 %stenosed.  Prox Cx lesion, 20 %stenosed.  Ost 1st Mrg lesion, 80 %stenosed.  1st Mrg-2 lesion, 80 %stenosed.  1st Mrg-1 lesion, 90 %stenosed.  Mid Cx-2 lesion, 50 %stenosed.  Mid Cx-1 lesion, 60 %stenosed.  Prox LAD lesion, 60 %stenosed.  Mid LAD lesion, 85 %stenosed.  Severe multivessel CAD with 60% proximal LAD stenosis and 85% mid LAD stenosis; 80, 90 and 80% OM1 stenosis of the left circumflex with 60% mid AV groove and 50% mid distal AV groove stenosis; dominant RCA with 30% proximal followed by 70% proximal stenosis, 90% mid stenosis, and diffuse mid distal PDA stenosis of at least 80%.  LVEDP 33 mmHg; EF by previous echo 30-35%; left ventriculography not performed.  Moderate right heart pressure elevation with mild/moderate pulmonary hypertension.  RECOMMENDATION: In this diabetic female with significant multivessel CAD, recommend surgical consultation for consideration for CABG revascularization.  09/2017 PCI  LESION #1  Prox LAD lesion, 60 %stenosed. Prox LAD to Mid LAD lesion, 65 %stenosed. Mid LAD lesion, 85 %stenosed. Tandem Lesions.  A STENT RESOLUTE ONYX [id number] drug eluting stent was successfully placed. Post-dilated in tapered fashion: 3.6 - 2.9 mm  Post intervention, there is a 0% residual stenosis.  LESION $2  Prox RCA lesion, 45 %stenosed. Prox RCA to Mid RCA lesion, 70 %stenosed. Mid RCA lesion, 90 %stenosed. Tandem Lesions  A STENT RESOLUTE ONYX [id number] drug eluting stent and STENT RESOLUTE ONYX 2.75X26 drug-eluting stent were successfully placed in overlapping fashion.  Post intervention, there is a 0% residual stenosis. Post-dilated in tapered fashion: 3.6 - 3.1 mm  RPDA lesion, 80 %stenosed. Plan Medical management.  Successful 2 vessel PCI with stable circumflex disease. Plan is to treat circumflex flex disease medically for now.   Plan: Return to nursing  unit for post PCI care TR band removal. Continue to optimize medical management, but from a post Standpoint she will be able to be discharged tomorrow. Continue aspirin plus Plavix for minimum one year, but would continue Plavix lifelong   08/2017 Echocardiogram: Study Conclusions  - Procedure narrative: Transthoracic echocardiography. Image quality was fair. The study was technically difficult, as a result of poor sound wave transmission. Intravenous contrast (Definity) was administered. - Left ventricle: The cavity size was mildly dilated. Systolic function was moderately to severely reduced. The estimated ejection fraction was in the range of 30% to 35%. Diffuse hypokinesis. Doppler parameters are consistent with abnormal left ventricular relaxation (grade 1 diastolic dysfunction). Mild concentric and moderate focal basal septal hypertrophy. - Ventricular septum: Septal motion showed abnormal function and dyssynergy. These changes are consistent with a left bundle branch block. - Aortic valve: Mildly calcified annulus. - Systemic veins: IVC is dilated with normal respiratory variation. It was suboptimally visualized. Estimated right atrial pressure: 8 mmHg. - Pericardium, extracardiac: Moderate size pericardial effusion. There is some right atrial inversion noted, but no frank tamponade physiology.  Laboratory Data:  Chemistry Recent Labs  Lab 07/16/18 1442  NA 139  K 4.4  CL 108  CO2 21*  GLUCOSE 218*  BUN 18  CREATININE 0.96  CALCIUM 8.5*  GFRNONAA >60  GFRAA >60  ANIONGAP 10    Total Protein  Date Value Ref Range Status  03/14/2018 8.6 (H) 6.5 - 8.1 g/dL Final   Albumin  Date Value Ref Range Status  03/14/2018 3.3 (L) 3.5 - 5.0 g/dL Final  AST  Date Value Ref Range Status  03/14/2018 33 15 - 41 U/L Final   ALT  Date Value Ref Range Status  03/14/2018 33 14 - 54 U/L Final   Alkaline Phosphatase  Date Value Ref Range  Status  03/14/2018 215 (H) 38 - 126 U/L Final   Total Bilirubin  Date Value Ref Range Status  03/14/2018 0.8 0.3 - 1.2 mg/dL Final   Hematology Recent Labs  Lab 07/16/18 1442  WBC 7.4  RBC 4.61  HGB 12.3  HCT 39.1  MCV 84.8  MCH 26.7  MCHC 31.5  RDW 14.2  PLT 242   Cardiac Enzymes Recent Labs  Lab 07/16/18 2048 07/17/18 0152 07/17/18 0648  TROPONINI <0.03 <0.03 <0.03    Recent Labs  Lab 07/16/18 1451 07/16/18 1831  TROPIPOC 0.02 0.00    BNP Recent Labs  Lab 07/16/18 2048  BNP 50.2    DDimer No results for input(s): DDIMER in the last 168 hours. TSH:  Lab Results  Component Value Date   TSH 0.181 (L) 07/17/2018   Lipids: Lab Results  Component Value Date   CHOL 147 07/17/2018   HDL 31 (L) 07/17/2018   LDLCALC 83 07/17/2018   TRIG 167 (H) 07/17/2018   CHOLHDL 4.7 07/17/2018   HgbA1c: Lab Results  Component Value Date   HGBA1C 10.4 (H) 07/17/2018    Radiology/Studies:  Dg Chest 2 View  Result Date: 07/16/2018 CLINICAL DATA:  Central chest pain with nausea for 1.5 hours EXAM: CHEST - 2 VIEW COMPARISON:  09/05/2017 FINDINGS: Cardiopericardial enlargement that appears less prominent than before. Generalized prominent vessels. No Kerley lines, effusion, or pneumothorax. Mild linear opacity in the left mid lung from atelectasis or scarring. No acute osseous finding. Thyroidectomy clips. IMPRESSION: Cardiomegaly with borderline vascular congestion. No pulmonary edema. Electronically Signed   By: Monte Fantasia M.D.   On: 07/16/2018 14:59   Assessment and Plan:   1. Chest pain with hx of multivessel CAD s/p DES to the LAD and RCA x2, circumflex medically managed: -Initial cardiac cath on 09/10/2017 with severe multivessel CAD with recommendations for surgical consultation however, not found to be a candidate.  Therefore, patient was taken back to the cath lab on 09/12/2017 for PCI of the LAD and RCA x2.  Recommendations were for lifelong Plavix therapy and  aggressive risk reduction. Also noted to have ischemic cardiomyopathy with an LVEF of 30 to 35% confirmed per echocardiogram. -Trop, <0.03 x3 -EKG with no significant changes from prior tracing with evidence of old LBBB -CXR without pulmonary vascular congestion  -Will obtain echocardiogram for comparison>>if changed can consider further ischemic workup  -Would lean toward aggressive risk reduction strategies with DM2 control and weight reduction and up-titrate medications as tolerated    2. Chronic combined systolic diastolic CHF: -Last echocardiogram with LVEF of 30-35% with diffuse hypokinesis and moiderate pericardial effusion (unchnaged form prior echo) -CXR without pulmonary edema -BNP, within normal limits -Weight, 308lb today, 307lb on admission  -I&O, net negative 1.8L -Continue home dose Lasix, Entresto, spironolactone  3.  Essential hypertension: -Stable, 28/15, 124/62, 110/52 -Continue amlodipine, Coreg, Entresto  4.  Hyperlipidemia: -LDL 80 per lipid panel 03/14/2018 -Continue atorvastatin  5.  Hypothyroidism: -TSH noted to be markedly low at 0.181 -Continue Synthroid 200 mcg  6.  DM2: -Uncontrolled, hemoglobin A1c 10.4 -Would benefit from diabetes coordinator recommendations -Primary team  For questions or updates, please contact [place] Please consult [email or web address] for contact info under Cardiology/STEMI.   Signed, Maureen Drown  NP-C [facility] Pager: [phone] 07/17/2018 12:16 PM  Attending Note:   The patient was seen and examined.  Agree with assessment and plan as noted above.  Changes made to the above note as needed.  Patient seen and independently examined with Maureen Jackson. NP .   We discussed all aspects of the encounter. I agree with the assessment and plan as stated above.  1.  Chest discomfort: I personally reviewed the angiograms from last year.  She has a residual tight circumflex marginal stenosis.  I suspect that this tight OM is  the cause of her current chest pain.  There are no good revascularization options for this obtuse marginal artery.  She is been declined for surgery and the vessel is not a candidate for PCI. Dr. Ellyn Hack was able to revascularize her right coronary artery and left anterior descending artery and got a very good result.  Her troponin levels remain negative.  EKG reviewed reveals no changes.  At this point we need to continue with aggressive medical therapy.  She needs to improve her diabetic control and also continue with current medications.  Hemoglobin A1c is greater than 10.  She is morbidly obese. Stressed to the patient and also her sister who was present in the room with her today the importance of diabetic control and weight loss.  Reviewed the fact that percutaneous intervention of this obtuse marginal artery would likely result in decline in her overall health instead of a benefit.  We will increase the isosorbide to 60 mg a day.  Consider Ranexa 1000 mg twice a day.  She needs significant improvement in her diabetic management.  If she were to have refractory angina or angina associated with EKG changes or positive troponin levels we could consider cath and possible PCI but I certainly would not go in there electively because the risk of acute closure is high.  2.  Chronic combined systolic and diastolic congestive heart failure: Continue current medications.  Continue Entresto, Lasix, spironolactone.  3.  Essential hypertension: Blood pressure is well controlled.   I have spent a total of 40 minutes with patient reviewing hospital  notes , telemetry, EKGs, labs and examining patient as well as establishing an assessment and plan that was discussed with the patient. > 50% of time was spent in direct patient care.   Maureen Jackson, Maureen Jackson., MD, [facility] 07/17/2018, 2:26 PM [address],  [place] Pager [phone]

## 2018-07-17 NOTE — Plan of Care (Signed)
  Problem: Nutrition: Goal: Adequate nutrition will be maintained Outcome: Completed/Met Note:  Diabetes coordinator provided teaching materials to patient

## 2018-07-17 NOTE — Progress Notes (Signed)
Progress Note    MATTELYN IMHOFF  JIR:[id number] DOB: 1956-07-24  DOA: 07/16/2018 PCP: Neale Burly, MD    Brief Narrative:     Medical records reviewed and are as summarized below:  Maureen Jackson is an 62 y.o. female with medical history significant of CHF with EF 30%, hypertension, hyperlipidemia, diabetes mellitus, TIA, hypothyroidism, CAD, stent placement, obesity, OSA not on CPAP, who presents with chest pain.  Patient states that she started having sudden onset of chest pain at about 10 AM.  It was located in the substernal area, initially 5 out of 10 in severity, then subsided after ~30 min, pressure-like, radiating to the right hand, associated with nausea. Also experienced some paresthesias of the right fingers. No shortness of breath.  No recent long distant traveling, no tenderness in the calf area.  Patient has mild dry cough, but no fever or chills.  Patient denies vomiting, diarrhea or abdominal pain pain no symptoms of UTI or unilateral weakness. She had another episode of similar chest pain in the late afternoon.  Currently patient is chest pain-free.  No shortness of breath.    Assessment/Plan:   Principal Problem:   Chest pain Active Problems:   Hypothyroidism   Essential hypertension   Diabetes mellitus type 2 with complications, uncontrolled (HCC)   CAD, multiple vessel   TIA (transient ischemic attack)   Chronic combined systolic and diastolic CHF (congestive heart failure) ([place])   Hyperlipidemia  Diabetes mellitus type 2 with complications, uncontrolled ([place]):  -I suspect this is the main issues -poorly controlled -patient does not follow diet nor check her blood sugar on a regular basis -start long acting insulin-- plan to do teaching as well as outpatient classes -plan to d/c on PEN for easier use -use SSI here for now  Chest pain and hx of CAD: Per cards: no good re-vascularization options for obtuse marginal artery Not a candidate for  CABG -CE negative -EKG unchanged Plan to: increase the isosorbide to 60 mg a day.  Consider Ranexa 1000 mg twice a day   Hypothyroidism: Last TSH was 14.3 on 09/05/17, not at goal -reduce Synthroid -TSH: low  HTN:  -continue home meds   Hx of TIA (transient ischemic attack): -Plavix, ASA and lipitor  Chronic combined systolic and diastolic CHF (congestive heart failure) ([place]): 2D echo on 09/18/2017 showed EF 30 to 35%.  Patient has trace leg edema, no JVD.  No respiratory distress.  No pulmonary edema on chest x-ray.  CHF seems to be compensated. -Continue home dose Lasix, Entresto, spironolactone  Hyperlipidemia:  -resume lipitor (will be sure to send to pharmacy as this has been a barrier in the past)  Morbid obesity Body mass index is 51.34 kg/m.   Family Communication/Anticipated D/C date and plan/Code Status   DVT prophylaxis: Lovenox ordered. Code Status: Full Code.  Family Communication: sister Disposition Plan: home in AM after extensive diabetic teaching   Medical Consultants:    cards.    Subjective:   No further chest pain hungry  Objective:    Vitals:   07/17/18 0406 07/17/18 0412 07/17/18 0747 07/17/18 1149  BP:  (!) 110/52 124/62 (!) 128/58  Pulse:  65 60 61  Resp:  18 18 16   Temp:  (!) 97.3 F (36.3 C) 98.2 F (36.8 C) 98 F (36.7 C)  TempSrc:  Oral Oral Oral  SpO2:  98% 99% 99%  Weight: (!) 139.9 kg (308 lb 8 oz)     Height:  Intake/Output Summary (Last 24 hours) at 07/17/2018 1434 Last data filed at 07/17/2018 1247 Gross per 24 hour  Intake 720 ml  Output 2600 ml  Net -1880 ml   Filed Weights   07/17/18 0106 07/17/18 0406  Weight: (!) 139.7 kg (307 lb 14.4 oz) (!) 139.9 kg (308 lb 8 oz)    Exam: Morbidly obese female No increased work of breathing +BS, soft Alert and oriented, speech some time difficult to follow  Data Reviewed:   I have personally reviewed following labs and imaging studies:  Labs: Labs  show the following:   Basic Metabolic Panel: Recent Labs  Lab 07/16/18 1442  NA 139  K 4.4  CL 108  CO2 21*  GLUCOSE 218*  BUN 18  CREATININE 0.96  CALCIUM 8.5*   GFR Estimated Creatinine Clearance: 88.7 mL/min (by C-G formula based on SCr of 0.96 mg/dL). Liver Function Tests: No results for input(s): AST, ALT, ALKPHOS, BILITOT, PROT, ALBUMIN in the last 168 hours. No results for input(s): LIPASE, AMYLASE in the last 168 hours. No results for input(s): AMMONIA in the last 168 hours. Coagulation profile No results for input(s): INR, PROTIME in the last 168 hours.  CBC: Recent Labs  Lab 07/16/18 1442  WBC 7.4  HGB 12.3  HCT 39.1  MCV 84.8  PLT 242   Cardiac Enzymes: Recent Labs  Lab 07/16/18 2048 07/17/18 0152 07/17/18 0648  TROPONINI <0.03 <0.03 <0.03   BNP (last 3 results) No results for input(s): PROBNP in the last 8760 hours. CBG: Recent Labs  Lab 07/16/18 2211 07/17/18 0746 07/17/18 1148  GLUCAP 247* 234* 156*   D-Dimer: No results for input(s): DDIMER in the last 72 hours. Hgb A1c: Recent Labs    07/17/18 0152  HGBA1C 10.4*   Lipid Profile: Recent Labs    07/17/18 0152  CHOL 147  HDL 31*  LDLCALC 83  TRIG 167*  CHOLHDL 4.7   Thyroid function studies: Recent Labs    07/17/18 0152  TSH 0.181*   Anemia work up: No results for input(s): VITAMINB12, FOLATE, FERRITIN, TIBC, IRON, RETICCTPCT in the last 72 hours. Sepsis Labs: Recent Labs  Lab 07/16/18 1442  WBC 7.4    Microbiology No results found for this or any previous visit (from the past 240 hour(s)).  Procedures and diagnostic studies:  Dg Chest 2 View  Result Date: 07/16/2018 CLINICAL DATA:  Central chest pain with nausea for 1.5 hours EXAM: CHEST - 2 VIEW COMPARISON:  09/05/2017 FINDINGS: Cardiopericardial enlargement that appears less prominent than before. Generalized prominent vessels. No Kerley lines, effusion, or pneumothorax. Mild linear opacity in the left mid lung  from atelectasis or scarring. No acute osseous finding. Thyroidectomy clips. IMPRESSION: Cardiomegaly with borderline vascular congestion. No pulmonary edema. Electronically Signed   By: Monte Fantasia M.D.   On: 07/16/2018 14:59    Medications:   . amLODipine  5 mg Oral Daily  . aspirin EC  325 mg Oral Daily  . atorvastatin  80 mg Oral q1800  . carvedilol  25 mg Oral BID  . clopidogrel  75 mg Oral Q breakfast  . enoxaparin (LOVENOX) injection  40 mg Subcutaneous Q24H  . furosemide  40 mg Oral Daily  . insulin aspart  0-15 Units Subcutaneous TID WC  . insulin aspart  0-5 Units Subcutaneous QHS  . insulin glargine  16 Units Subcutaneous Daily  . [START ON 07/18/2018] isosorbide mononitrate  60 mg Oral Daily  . [START ON 07/18/2018] levothyroxine  175 mcg  Oral QAC breakfast  . sacubitril-valsartan  1 tablet Oral BID  . spironolactone  12.5 mg Oral Daily   Continuous Infusions:   LOS: 0 days   Geradine Girt  [facility]   *Please refer to [email or web address], password [email or web address] to get updated schedule on who will round on this patient, as hospitalists switch teams weekly. If 7PM-7AM, please contact night-coverage at [email or web address], password [email or web address] for any overnight needs.  07/17/2018, 2:34 PM

## 2018-07-18 DIAGNOSIS — E118 Type 2 diabetes mellitus with unspecified complications: Secondary | ICD-10-CM | POA: Diagnosis not present

## 2018-07-18 DIAGNOSIS — I2510 Atherosclerotic heart disease of native coronary artery without angina pectoris: Secondary | ICD-10-CM | POA: Diagnosis not present

## 2018-07-18 DIAGNOSIS — I5042 Chronic combined systolic (congestive) and diastolic (congestive) heart failure: Secondary | ICD-10-CM | POA: Diagnosis not present

## 2018-07-18 DIAGNOSIS — E785 Hyperlipidemia, unspecified: Secondary | ICD-10-CM | POA: Diagnosis not present

## 2018-07-18 DIAGNOSIS — R079 Chest pain, unspecified: Secondary | ICD-10-CM | POA: Diagnosis not present

## 2018-07-18 DIAGNOSIS — E1165 Type 2 diabetes mellitus with hyperglycemia: Secondary | ICD-10-CM | POA: Diagnosis not present

## 2018-07-18 DIAGNOSIS — E039 Hypothyroidism, unspecified: Secondary | ICD-10-CM | POA: Diagnosis not present

## 2018-07-18 DIAGNOSIS — I10 Essential (primary) hypertension: Secondary | ICD-10-CM | POA: Diagnosis not present

## 2018-07-18 LAB — BASIC METABOLIC PANEL
ANION GAP: 9 (ref 5–15)
BUN: 29 mg/dL — ABNORMAL HIGH (ref 6–20)
CALCIUM: 8.4 mg/dL — AB (ref 8.9–10.3)
CO2: 27 mmol/L (ref 22–32)
CREATININE: 1.29 mg/dL — AB (ref 0.44–1.00)
Chloride: 103 mmol/L (ref 98–111)
GFR calc non Af Amer: 44 mL/min — ABNORMAL LOW (ref >60)
GFR, EST AFRICAN AMERICAN: 51 mL/min — AB (ref >60)
Glucose, Bld: 249 mg/dL — ABNORMAL HIGH (ref 70–99)
Potassium: 4.1 mmol/L (ref 3.5–5.1)
SODIUM: 139 mmol/L (ref 135–145)

## 2018-07-18 LAB — CBC
HCT: 36.5 % (ref 36.0–46.0)
HEMOGLOBIN: 11.7 g/dL — AB (ref 12.0–15.0)
MCH: 27.3 pg (ref 26.0–34.0)
MCHC: 32.1 g/dL (ref 30.0–36.0)
MCV: 85.1 fL (ref 78.0–100.0)
Platelets: 210 10*3/uL (ref 150–400)
RBC: 4.29 MIL/uL (ref 3.87–5.11)
RDW: 14.3 % (ref 11.5–15.5)
WBC: 7.1 10*3/uL (ref 4.0–10.5)

## 2018-07-18 LAB — GLUCOSE, CAPILLARY
GLUCOSE-CAPILLARY: 274 mg/dL — AB (ref 70–99)
Glucose-Capillary: 227 mg/dL — ABNORMAL HIGH (ref 70–99)

## 2018-07-18 MED ORDER — LEVOTHYROXINE SODIUM 175 MCG PO TABS: 175.0000 ug | ORAL_TABLET | Freq: Every day | ORAL | 0 refills | Status: DC

## 2018-07-18 MED ORDER — INSULIN GLARGINE 100 UNIT/ML SOLOSTAR PEN: 20.0000 [IU] | PEN_INJECTOR | Freq: Every day | SUBCUTANEOUS | 0 refills | Status: DC

## 2018-07-18 MED ORDER — INSULIN GLARGINE 100 UNIT/ML ~~LOC~~ SOLN: 20.0000 [IU] | Freq: Every day | SUBCUTANEOUS | Status: DC

## 2018-07-18 MED ORDER — INSULIN PEN NEEDLE 32G X 4 MM MISC: 0 refills | Status: DC

## 2018-07-18 MED ORDER — INSULIN DEGLUDEC 100 UNIT/ML ~~LOC~~ SOPN: 16.0000 [IU] | PEN_INJECTOR | Freq: Every day | SUBCUTANEOUS | 0 refills | Status: DC

## 2018-07-18 MED ORDER — ISOSORBIDE MONONITRATE ER 60 MG PO TB24: 60.0000 mg | ORAL_TABLET | Freq: Every day | ORAL | 0 refills | Status: DC

## 2018-07-18 NOTE — Care Management (Signed)
07-18-18  BENEFITS CHECK:   #  7.  S/W SELISA  @ [employer] # [phone] OPT- 2  JARDIANCE  10 MG DAILY COVER- YES CO-PAY- $ 8.50 TIER- NO PRIOR APPROVAL- NO  2. NOVALOG  FLEX PEN  ( no dosage ) COVER- YES CO-PAY- $ 8.50 TIER- NO PRIOR APPROVAL- NO  3. LANTUS  SOLORSTAR PEN ( no dosage ) COVER- NOT COVER PRIOR APPROVAL -YES # [phone]  ALTERNATIVE: 1 TRESIBA  FLEX PEN  COVER- YES CO-PAY- $ 8.50 TIER- NO PRIOR APPROVAL- NO  PREFERRED PHARMACY : YES  [facility]

## 2018-07-18 NOTE — Progress Notes (Signed)
Inpatient Diabetes Program Recommendations  AACE/ADA: New Consensus Statement on Inpatient Glycemic Control (2015)  Target Ranges:  Prepandial:   less than 140 mg/dL      Peak postprandial:   less than 180 mg/dL (1-2 hours)      Critically ill patients:  140 - 180 mg/dL   Lab Results  Component Value Date   GLUCAP 227 (H) 07/18/2018   HGBA1C 10.4 (H) 07/17/2018    Review of Glycemic Control Results for CYNDY, BRAVER (MRN [id number]) as of 07/18/2018 11:17  Ref. Range 07/17/2018 11:48 07/17/2018 16:02 07/17/2018 21:40 07/18/2018 07:43  Glucose-Capillary Latest Ref Range: 70 - 99 mg/dL 156 (H) 256 (H) 229 (H) 227 (H)   Diabetes history: Type 2 DM Outpatient Diabetes medications: Jardiance 13m QD Current orders for Inpatient glycemic control: Novolog 0-9 units TID, Novolog 0-5 units QHS, Lantus 16 units QD  Inpatient Diabetes Program Recommendations:    Spoke with patient again regarding DM. Patient expresses hesitancy with starting insulin because she doesn't want to stick herself.  Reviewed survival skills, interventions, frequency of BS checks, and patho of need for insulin. Encouragement provided on self adminsitration as a means to improve glycemic control. Patient complies.   Educated patient on insulin pen use at home. Reviewed contents of insulin flexpen starter kit. Reviewed all steps if insulin pen including attachment of needle, 2-unit air shot, dialing up dose, giving injection, removing needle, disposal of sharps, storage of unused insulin, disposal of insulin etc. Patient able to provide successful return demonstration. Also, reviewed troubleshooting with insulin pen. MD to give patient Rxs for insulin pens and insulin pen needles.  Spoke with RN to ensure that they are working with patient to self inject insulin while inpatient. Also, encouraged RN and NT to help with CBGs. Patient appears overwhelmed and lacks confidence; will need reinforcement.   Thanks, LBronson Curb MSN,  [facility] Diabetes Coordinator [phone](8a-5p)

## 2018-07-18 NOTE — Care Management Note (Signed)
Case Management Note  Patient Details  Name: Maureen Jackson MRN: [id number] Date of Birth: [birth year]  Subjective/Objective: Chest Pain                  Action/Plan: Patient lives at home; PCP is Dr Rueben Bash; has private insurance with [employer] with prescription drug coverage; [facility]. Her family/ friends takes her to apt. Patient needs ongoing education regarding DM; [place] choice offered, pt chose [facility]; Dan with [facility] called for arrangements.   Expected Discharge Date:  07/18/18               Expected Discharge Plan:  [place]  Discharge planning Services  CM Consult  Choice offered to:  Patient    HH Arranged:  RN, Disease Management [place] Agency:  [place]  Status of Service:  In process, will continue to follow  Sherrilyn Rist [phone] 07/18/2018, 4:28 PM

## 2018-07-18 NOTE — Progress Notes (Addendum)
Progress Note  Patient Name: Maureen Jackson Date of Encounter: 07/18/2018  Primary Cardiologist:  Carlyle Dolly, MD  Subjective   No CP overnight. DOE is chronic. Feels tired and weak all the time.  Inpatient Medications    Scheduled Meds: . amLODipine  5 mg Oral Daily  . aspirin EC  325 mg Oral Daily  . atorvastatin  80 mg Oral q1800  . carvedilol  25 mg Oral BID  . clopidogrel  75 mg Oral Q breakfast  . enoxaparin (LOVENOX) injection  40 mg Subcutaneous Q24H  . furosemide  40 mg Oral Daily  . insulin aspart  0-15 Units Subcutaneous TID WC  . insulin aspart  0-5 Units Subcutaneous QHS  . insulin glargine  16 Units Subcutaneous Daily  . isosorbide mononitrate  60 mg Oral Daily  . levothyroxine  175 mcg Oral QAC breakfast  . sacubitril-valsartan  1 tablet Oral BID  . spironolactone  12.5 mg Oral Daily   Continuous Infusions:  PRN Meds: acetaminophen, albuterol, ALPRAZolam, hydrALAZINE, morphine injection, nitroGLYCERIN, ondansetron (ZOFRAN) IV, zolpidem   Vital Signs    Vitals:   07/17/18 2213 07/18/18 0132 07/18/18 0559 07/18/18 0924  BP: (!) 100/44 131/65 126/68 132/61  Pulse: 62  66 71  Resp:   18 16  Temp:   98.3 F (36.8 C)   TempSrc:   Oral   SpO2:   98% 96%  Weight:   (!) 137.9 kg   Height:        Intake/Output Summary (Last 24 hours) at 07/18/2018 1045 Last data filed at 07/18/2018 1044 Gross per 24 hour  Intake 600 ml  Output 3400 ml  Net -2800 ml   Filed Weights   07/17/18 0106 07/17/18 0406 07/18/18 0559  Weight: (!) 139.7 kg (!) 139.9 kg (!) 137.9 kg   Telemetry    SR - Personally Reviewed  ECG    08/06, SR, LBBB is old - Personally Reviewed  Physical Exam   General:  Morbidly obese middle aged female,   NAD  Head: Normocephalic, atraumatic.  Neck: Supple without bruits, JVD not seen elevated, difficult to assess 2nd body habitus. Lungs:  Resp regular and unlabored, CTA. Heart: RRR, S1, S2, no S3, S4, soft murmur; no  rub. Abdomen: Soft, non-tender,  Obese.   No hepatomegaly. No rebound/guarding. No obvious abdominal masses. Extremities: No clubbing, cyanosis, no edema. Distal pedal pulses are 2+ bilaterally. Neuro: Alert and oriented X 3. Moves all extremities spontaneously. Psych: Normal affect.  Labs    Hematology Recent Labs  Lab 07/16/18 1442 07/18/18 0859  WBC 7.4 7.1  RBC 4.61 4.29  HGB 12.3 11.7*  HCT 39.1 36.5  MCV 84.8 85.1  MCH 26.7 27.3  MCHC 31.5 32.1  RDW 14.2 14.3  PLT 242 210    Chemistry Recent Labs  Lab 07/16/18 1442 07/17/18 0648 07/18/18 0859  NA 139 140 139  K 4.4 4.0 4.1  CL 108 106 103  CO2 21* 23 27  GLUCOSE 218* 241* 249*  BUN 18 21* 29*  CREATININE 0.96 1.26* 1.29*  CALCIUM 8.5* 8.3* 8.4*  GFRNONAA >60 45* 44*  GFRAA >60 53* 51*  ANIONGAP 10 11 9      Cardiac Enzymes Recent Labs  Lab 07/16/18 2048 07/17/18 0152 07/17/18 0648  TROPONINI <0.03 <0.03 <0.03    Recent Labs  Lab 07/16/18 1451 07/16/18 1831  TROPIPOC 0.02 0.00     BNP Recent Labs  Lab 07/16/18 2048  BNP 50.2  Lipid Panel     Component Value Date/Time   CHOL 147 07/17/2018 0152   TRIG 167 (H) 07/17/2018 0152   HDL 31 (L) 07/17/2018 0152   CHOLHDL 4.7 07/17/2018 0152   VLDL 33 07/17/2018 0152   LDLCALC 83 07/17/2018 0152    Radiology    Dg Chest 2 View  Result Date: 07/16/2018 CLINICAL DATA:  Central chest pain with nausea for 1.5 hours EXAM: CHEST - 2 VIEW COMPARISON:  09/05/2017 FINDINGS: Cardiopericardial enlargement that appears less prominent than before. Generalized prominent vessels. No Kerley lines, effusion, or pneumothorax. Mild linear opacity in the left mid lung from atelectasis or scarring. No acute osseous finding. Thyroidectomy clips. IMPRESSION: Cardiomegaly with borderline vascular congestion. No pulmonary edema. Electronically Signed   By: Monte Fantasia M.D.   On: 07/16/2018 14:59     Cardiac Studies   None this admit  Patient Profile      62 y.o. female w/ hx Maureen Jackson is a 62 y.o. female with a hx of CAD status post DES to the LAD and DES to the RCA x2, circumflex disease managed medically with lifelong Plavix recommended on cath 09/2017, ischemic cardiomyopathy ejection fraction 30-35% with grade1 DD echo 08/2017, moderate pericardial effusion on echo 09/2017 stable by serial imaging andsevere hypothyroidism, was admitted 08/06 with chest pain, cards saw 08/07.   Assessment & Plan     Principal Problem: 1.  Chest pain - ez have remained negative, ECG unchanged - Dr Acie Fredrickson reviewed the films, OM may be culprit lesion, but med rx is only option - Imdur increased to 60 mg qd 08/07, seems to be helping. - can add Ranexa 1000 mg bid if sx not controlled.  - consider cath only for elevated ez or ECG changes  Active Problems: 2.  Chronic combined systolic and diastolic CHF (congestive heart failure) (HCC) - volume management ok, continue rx - ambulate pt to make sure O2  sats stay good w/ ambulation  3. Hyperlipidemia - LDL goal < 70 - per pt, [facility] not [facility] rx, she has not been taking anything - resume Lipitor 80 mg - contacted [facility] to figure this out, they did not have rx, called it in.  Otherwise, per IM   Hypothyroidism   Essential hypertension   Diabetes mellitus type 2 with complications, uncontrolled ([place])   CAD, multiple vessel   TIA (transient ischemic attack)   Hyperlipidemia    Signed, Rosaria Ferries , PA-C 10:45 AM 07/18/2018 Pager: [phone]  Attending Note:   The patient was seen and examined.  Agree with assessment and plan as noted above.  Changes made to the above note as needed.  Patient seen and independently examined with Rosaria Ferries, PA .   We discussed all aspects of the encounter. I agree with the assessment and plan as stated above.  1.  Chest discomfort: The patient has known coronary artery disease involving the left circumflex artery.   She is had her RCA and LAD stented. There are no revascularization options for her left circumflex artery.  We have increased her isosorbide and she seems to be feeling better.  I stressed importance of good diet, exercise, weight loss program.  She needs better diabetic control. Can consider Ranexa if she continues to have episodes of chest pain.  He discharged home tonight in satisfactory condition.   I have spent a total of 40 minutes with patient reviewing hospital  notes , telemetry, EKGs, labs and examining  patient as well as establishing an assessment and plan that was discussed with the patient. > 50% of time was spent in direct patient care.    Thayer Headings, Brooke Bonito., MD, [facility] 07/18/2018, 5:15 PM [address]  [place] Pager [phone]

## 2018-07-18 NOTE — Discharge Summary (Signed)
Physician Discharge Summary  Maureen Jackson ZCH:[id number] DOB: 10-Apr-1956 DOA: 07/16/2018  PCP: Neale Burly, MD  Admit date: 07/16/2018 Discharge date: 07/18/2018  Admitted From: home Discharge disposition: home   Recommendations for Outpatient Follow-Up:   1. Started on injectable insulin for HgbA1c> 10-- will need titration and continued educations-- arranged for H/H RN and outpatient diabetic teaching 2. Does not follow a diabetic diet-- drinks soda 3. TSH 4-6 weeks 4. BMP 1 week re Cr   Discharge Diagnosis:   Principal Problem:   Chest pain Active Problems:   Hypothyroidism   Essential hypertension   Diabetes mellitus type 2 with complications, uncontrolled (HCC)   CAD, multiple vessel   TIA (transient ischemic attack)   Chronic combined systolic and diastolic CHF (congestive heart failure) ([place])   Hyperlipidemia    Discharge Condition: Improved.  Diet recommendation: Low sodium, heart healthy.  Carbohydrate-modified. .  Wound care: None.  Code status: Full.   History of Present Illness:   Maureen Jackson is a 62 y.o. female with medical history significant of CHF with EF 30%, hypertension, hyperlipidemia, diabetes mellitus, TIA, hypothyroidism, CAD, stent placement, obesity, OSA not on CPAP, who presents with chest pain.  Patient states that she started having sudden onset of chest pain at about 10 AM.  It was located in the substernal area, initially 5 out of 10 in severity, then subsided after ~30 min, pressure-like, radiating to the right hand, associated with nausea. Also experienced some paresthesias of the right fingers. No shortness of breath.  No recent long distant traveling, no tenderness in the calf area.  Patient has mild dry cough, but no fever or chills.  Patient denies vomiting, diarrhea or abdominal pain pain no symptoms of UTI or unilateral weakness. She had another episode of similar chest pain in the late afternoon.  Currently patient is  chest pain-free.  No shortness of breath.   Hospital Course by Problem:   Diabetes mellitus type 2 with complications, uncontrolled ([place]): -I suspect this is the main issues -poorly controlled -patient does not follow diet nor check her blood sugar on a regular basis -start long acting insulin-- plan to do teaching as well as outpatient classes -plan to d/c on PEN for easier use -insurance covers [place] flex pen the best  Chest painand hx of CAD: Per cards: no good re-vascularization options for obtuse marginal artery Not a candidate for CABG -CE negative -EKG unchanged Plan OI:[facility] the isosorbide to 60 mg a day. Consider Ranexa 1000 mg twice a day as an outpatient   Hypothyroidism: Last TSH was14.3 on 09/05/17, not at goal but this hospitalization shows a low TSH -reduce Synthroid -outpt follow up  HTN:  -continue home meds  Hx ofTIA (transient ischemic attack): -Plavix, ASA and lipitor  Chronic combined systolic and diastolic CHF (congestive heart failure) (HCC):2D echo on 09/18/2017 showed EF 30 to 35%. Patient has trace leg edema, no JVD. No respiratory distress. No pulmonary edema on chest x-ray. CHF seems to be compensated. -Continue home dose Lasix, Entresto, spironolactone  Hyperlipidemia:  -resume lipitor (sent to pharmacy as this has been a barrier in the past)  Morbid obesity Body mass index is 51.34 kg/m.    Medical Consultants:   Cards Diabetic coordinator   Discharge Exam:   Vitals:   07/18/18 0924 07/18/18 1136  BP: 132/61 (!) 121/53  Pulse: 71 65  Resp: 16   Temp:  98.1 F (36.7 C)  SpO2: 96% 98%  Vitals:   07/18/18 0132 07/18/18 0559 07/18/18 0924 07/18/18 1136  BP: 131/65 126/68 132/61 (!) 121/53  Pulse:  66 71 65  Resp:  18 16   Temp:  98.3 F (36.8 C)  98.1 F (36.7 C)  TempSrc:  Oral  Oral  SpO2:  98% 96% 98%  Weight:  (!) 137.9 kg    Height:        General exam: Appears calm and comfortable.      The results of significant diagnostics from this hospitalization (including imaging, microbiology, ancillary and laboratory) are listed below for reference.     Procedures and Diagnostic Studies:   Dg Chest 2 View  Result Date: 07/16/2018 CLINICAL DATA:  Central chest pain with nausea for 1.5 hours EXAM: CHEST - 2 VIEW COMPARISON:  09/05/2017 FINDINGS: Cardiopericardial enlargement that appears less prominent than before. Generalized prominent vessels. No Kerley lines, effusion, or pneumothorax. Mild linear opacity in the left mid lung from atelectasis or scarring. No acute osseous finding. Thyroidectomy clips. IMPRESSION: Cardiomegaly with borderline vascular congestion. No pulmonary edema. Electronically Signed   By: Monte Fantasia M.D.   On: 07/16/2018 14:59     Labs:   Basic Metabolic Panel: Recent Labs  Lab 07/16/18 1442 07/17/18 0648 07/18/18 0859  NA 139 140 139  K 4.4 4.0 4.1  CL 108 106 103  CO2 21* 23 27  GLUCOSE 218* 241* 249*  BUN 18 21* 29*  CREATININE 0.96 1.26* 1.29*  CALCIUM 8.5* 8.3* 8.4*   GFR Estimated Creatinine Clearance: 65.5 mL/min (A) (by C-G formula based on SCr of 1.29 mg/dL (H)). Liver Function Tests: No results for input(s): AST, ALT, ALKPHOS, BILITOT, PROT, ALBUMIN in the last 168 hours. No results for input(s): LIPASE, AMYLASE in the last 168 hours. No results for input(s): AMMONIA in the last 168 hours. Coagulation profile No results for input(s): INR, PROTIME in the last 168 hours.  CBC: Recent Labs  Lab 07/16/18 1442 07/18/18 0859  WBC 7.4 7.1  HGB 12.3 11.7*  HCT 39.1 36.5  MCV 84.8 85.1  PLT 242 210   Cardiac Enzymes: Recent Labs  Lab 07/16/18 2048 07/17/18 0152 07/17/18 0648  TROPONINI <0.03 <0.03 <0.03   BNP: Invalid input(s): POCBNP CBG: Recent Labs  Lab 07/17/18 1148 07/17/18 1602 07/17/18 2140 07/18/18 0743 07/18/18 1133  GLUCAP 156* 256* 229* 227* 274*   D-Dimer No results for input(s): DDIMER in the  last 72 hours. Hgb A1c Recent Labs    07/17/18 0152  HGBA1C 10.4*   Lipid Profile Recent Labs    07/17/18 0152  CHOL 147  HDL 31*  LDLCALC 83  TRIG 167*  CHOLHDL 4.7   Thyroid function studies Recent Labs    07/17/18 0152  TSH 0.181*   Anemia work up No results for input(s): VITAMINB12, FOLATE, FERRITIN, TIBC, IRON, RETICCTPCT in the last 72 hours. Microbiology No results found for this or any previous visit (from the past 240 hour(s)).   Discharge Instructions:   Discharge Instructions    Ambulatory referral to Nutrition and Diabetic Education   Complete by:  As directed    Diet - low sodium heart healthy   Complete by:  As directed    Diet Carb Modified   Complete by:  As directed    Discharge instructions   Complete by:  As directed    Bring log of blood sugar readings to PCP to adjust your insulin   Increase activity slowly   Complete by:  As directed  Allergies as of 07/18/2018      Reactions   Aspirin Nausea Only      Medication List    STOP taking these medications   JARDIANCE 10 MG Tabs tablet Generic drug:  empagliflozin   meclizine 25 MG tablet Commonly known as:  ANTIVERT     TAKE these medications   amLODipine 5 MG tablet Commonly known as:  NORVASC Take 1 tablet (5 mg total) by mouth daily.   aspirin EC 325 MG tablet Take 1 tablet (325 mg total) by mouth daily.   atorvastatin 80 MG tablet Commonly known as:  LIPITOR Take 1 tablet (80 mg total) by mouth daily at 6 PM.   carvedilol 25 MG tablet Commonly known as:  COREG Take 1 tablet (25 mg total) by mouth 2 (two) times daily.   clopidogrel 75 MG tablet Commonly known as:  PLAVIX Take 1 tablet (75 mg total) by mouth daily with breakfast.   furosemide 40 MG tablet Commonly known as:  LASIX Take 1 tablet (40 mg total) by mouth daily.   insulin degludec 100 UNIT/ML Sopn FlexTouch Pen Commonly known as:  TRESIBA Inject 0.16 mLs (16 Units total) into the skin daily. Please  include needles as well for injection   Insulin Pen Needle 32G X 4 MM Misc For use with lantus solostar-- change substitute if needed   isosorbide mononitrate 60 MG 24 hr tablet Commonly known as:  IMDUR Take 1 tablet (60 mg total) by mouth daily. Start taking on:  07/19/2018 What changed:    medication strength  how much to take   levothyroxine 175 MCG tablet Commonly known as:  SYNTHROID, LEVOTHROID Take 1 tablet (175 mcg total) by mouth daily before breakfast. Start taking on:  07/19/2018 What changed:    medication strength  See the new instructions.   ONETOUCH VERIO test strip Generic drug:  glucose blood USE TO TEST BLOOD SUGAR BID   sacubitril-valsartan 97-103 MG Commonly known as:  ENTRESTO Take 1 tablet by mouth 2 (two) times daily.   spironolactone 25 MG tablet Commonly known as:  ALDACTONE Take 0.5 tablets (12.5 mg total) by mouth daily.   VENTOLIN HFA 108 (90 Base) MCG/ACT inhaler Generic drug:  albuterol Inhale 1-2 puffs into the lungs every 6 (six) hours as needed for wheezing or shortness of breath. What changed:  when to take this      Follow-up Information    Neale Burly, MD. Go on 07/29/2018.   Specialty:  Internal Medicine Why:  @2pm  for BMP and bring log of your blood sugars so adjustments can be made Contact information: [place] [address] [phone]        Arnoldo Lenis, MD. Go on 08/20/2018.   Specialty:  Cardiology Why:  @3pm  Contact information: [place] [address] [place] Follow up.   Why:  A home health care nurse will go to your home Contact information: [address] [place] [address] [phone]            Time coordinating discharge: 35 min  Signed:  Geradine Girt  [facility] 07/18/2018, 4:36 PM

## 2018-07-18 NOTE — Plan of Care (Signed)
  RD consulted for nutrition education regarding diabetes.   Lab Results  Component Value Date   HGBA1C 10.4 (H) 07/17/2018   PTA DM medications are 10 mg empagliflozin daily.   Labs reviewed: 227-274 (inpatient orders for glycemic control are 0-9 units insulin aspart TID with meals, 0-5 units insulin aspart q HS, and 20 units insulin glargine daily).    Spoke with pt at bedside; detailed nutrition and DM hx obtained from pt. Pt reports she was diagnosed with DM approximately 1-2 years ago. She was initially started on Metformin, however, transitioned to jardiance due to poor DM management. Pt reports she checks her CBGS daily, however, admits "they are always in the 200's".   Pt was able to recall DM coordinator visits and able to teach back basic DM self-management principles. Pt will likely discharge home on insulin, however, pt is apprehensive about giving herself injections. RD provided encouragement and reinforced importance of rotating injection sites.   Pt states "I know I eat the wrong things". She consumes 3 meals per day (Breakfast: oatmeal and boiled egg OR sausage biscuit; Lunch: sausage biscuit; Dinner: hamburger steak, potatoes, and pinto beans). Pt consumes one 20 ounce Pepsi daily, as well as 3-4 bottles of water. Pt reports she has been thinking about consuming more water and is amenable to substituting Pepsi for diet gingerale (which she has been drinking in the hospital). Discussed ways that pt could choose healthier items at fast food restaurants as well as ways to meal prep to decrease need of picking up food outside of the home. Pt expressed fear of being able to afford healthy foods on limited food stamp budget; RD provided specific examples on budget friendly fruits, vegetables, high fiber foods, and lean proteins.   RD had long discussion with pt regarding the importance of self-management for DM and CHF (eating consistent times per day, going to regular PCP appointments,  checking CBGS, taking medications as prescribed, diet compliance, sodium restriction, and monitoring weights daily) to prevent further complications. Also discussed outpatient DM education, which DM coordinator has already ordered. Encouraged pt to attend education classes to assist with further management. Pt reports "I'll do whatever you tell me to  doso I can get this right".  RD provided "Carbohydrate Counting for People with Diabetes" handout from the Academy of Nutrition and Dietetics. Discussed different food groups and their effects on blood sugar, emphasizing carbohydrate-containing foods. Provided list of carbohydrates and recommended serving sizes of common foods.  Discussed importance of controlled and consistent carbohydrate intake throughout the day. Provided examples of ways to balance meals/snacks and encouraged intake of high-fiber, whole grain complex carbohydrates. Teach back method used.  Expect fair to good compliance.  Body mass index is 50.59 kg/m. Pt meets criteria for extreme obesity, class II based on current BMI.  Current diet order is Heart Healthy/ Carb Modified, patient is consuming approximately 100% of meals at this time. Labs and medications reviewed. No further nutrition interventions warranted at this time. RD contact information provided. If additional nutrition issues arise, please re-consult RD.  Gautham Hewins A. Jimmye Norman, RD, LDN, CDE Pager: [phone] After hours Pager: [phone]

## 2018-07-18 NOTE — Progress Notes (Addendum)
Patient ambulated in hallway for 337ft. Oxygen saturations >92% while ambulating. No SOB or complaints. No assisted device needed.

## 2018-07-18 NOTE — Progress Notes (Signed)
Pt has orders to be discharged. Discharge instructions given and pt has no additional questions at this time. Medication regimen reviewed and pt educated. Pt aware of new medication, tresiba. Pt verbalized understanding and has no additional questions. Telemetry box removed. IV removed and site in good condition. Pt stable and waiting for transportation.

## 2018-07-18 NOTE — Discharge Instructions (Signed)
Bring log of blood sugars to PCP so adjustments can be made to your insulin

## 2018-07-18 NOTE — Progress Notes (Signed)
Benefit check in progress for Jardiance 10mg  NW:MGEEATV 0-9 units TID, Novolog 0-5 units QHS, Lantus 16 units QD; patient has private insurance with [employer] with prescription drug coverage; Aneta Mins [phone]

## 2018-07-22 DIAGNOSIS — R69 Illness, unspecified: Secondary | ICD-10-CM | POA: Diagnosis not present

## 2018-07-23 DIAGNOSIS — I2510 Atherosclerotic heart disease of native coronary artery without angina pectoris: Secondary | ICD-10-CM | POA: Diagnosis not present

## 2018-07-23 DIAGNOSIS — I110 Hypertensive heart disease with heart failure: Secondary | ICD-10-CM | POA: Diagnosis not present

## 2018-07-23 DIAGNOSIS — I5042 Chronic combined systolic (congestive) and diastolic (congestive) heart failure: Secondary | ICD-10-CM | POA: Diagnosis not present

## 2018-07-23 DIAGNOSIS — Z8673 Personal history of transient ischemic attack (TIA), and cerebral infarction without residual deficits: Secondary | ICD-10-CM | POA: Diagnosis not present

## 2018-07-23 DIAGNOSIS — E785 Hyperlipidemia, unspecified: Secondary | ICD-10-CM | POA: Diagnosis not present

## 2018-07-23 DIAGNOSIS — E039 Hypothyroidism, unspecified: Secondary | ICD-10-CM | POA: Diagnosis not present

## 2018-07-23 DIAGNOSIS — Z6841 Body Mass Index (BMI) 40.0 and over, adult: Secondary | ICD-10-CM | POA: Diagnosis not present

## 2018-07-23 DIAGNOSIS — Z87891 Personal history of nicotine dependence: Secondary | ICD-10-CM | POA: Diagnosis not present

## 2018-07-23 DIAGNOSIS — E119 Type 2 diabetes mellitus without complications: Secondary | ICD-10-CM | POA: Diagnosis not present

## 2018-07-26 DIAGNOSIS — E039 Hypothyroidism, unspecified: Secondary | ICD-10-CM | POA: Diagnosis not present

## 2018-07-26 DIAGNOSIS — I5042 Chronic combined systolic (congestive) and diastolic (congestive) heart failure: Secondary | ICD-10-CM | POA: Diagnosis not present

## 2018-07-26 DIAGNOSIS — Z87891 Personal history of nicotine dependence: Secondary | ICD-10-CM | POA: Diagnosis not present

## 2018-07-26 DIAGNOSIS — Z6841 Body Mass Index (BMI) 40.0 and over, adult: Secondary | ICD-10-CM | POA: Diagnosis not present

## 2018-07-26 DIAGNOSIS — I110 Hypertensive heart disease with heart failure: Secondary | ICD-10-CM | POA: Diagnosis not present

## 2018-07-26 DIAGNOSIS — E119 Type 2 diabetes mellitus without complications: Secondary | ICD-10-CM | POA: Diagnosis not present

## 2018-07-26 DIAGNOSIS — I2510 Atherosclerotic heart disease of native coronary artery without angina pectoris: Secondary | ICD-10-CM | POA: Diagnosis not present

## 2018-07-26 DIAGNOSIS — E785 Hyperlipidemia, unspecified: Secondary | ICD-10-CM | POA: Diagnosis not present

## 2018-07-26 DIAGNOSIS — Z8673 Personal history of transient ischemic attack (TIA), and cerebral infarction without residual deficits: Secondary | ICD-10-CM | POA: Diagnosis not present

## 2018-07-30 DIAGNOSIS — Z87891 Personal history of nicotine dependence: Secondary | ICD-10-CM | POA: Diagnosis not present

## 2018-07-30 DIAGNOSIS — E785 Hyperlipidemia, unspecified: Secondary | ICD-10-CM | POA: Diagnosis not present

## 2018-07-30 DIAGNOSIS — I5042 Chronic combined systolic (congestive) and diastolic (congestive) heart failure: Secondary | ICD-10-CM | POA: Diagnosis not present

## 2018-07-30 DIAGNOSIS — Z6841 Body Mass Index (BMI) 40.0 and over, adult: Secondary | ICD-10-CM | POA: Diagnosis not present

## 2018-07-30 DIAGNOSIS — E039 Hypothyroidism, unspecified: Secondary | ICD-10-CM | POA: Diagnosis not present

## 2018-07-30 DIAGNOSIS — I2510 Atherosclerotic heart disease of native coronary artery without angina pectoris: Secondary | ICD-10-CM | POA: Diagnosis not present

## 2018-07-30 DIAGNOSIS — Z8673 Personal history of transient ischemic attack (TIA), and cerebral infarction without residual deficits: Secondary | ICD-10-CM | POA: Diagnosis not present

## 2018-07-30 DIAGNOSIS — I110 Hypertensive heart disease with heart failure: Secondary | ICD-10-CM | POA: Diagnosis not present

## 2018-07-30 DIAGNOSIS — E119 Type 2 diabetes mellitus without complications: Secondary | ICD-10-CM | POA: Diagnosis not present

## 2018-08-14 DIAGNOSIS — Z6841 Body Mass Index (BMI) 40.0 and over, adult: Secondary | ICD-10-CM | POA: Diagnosis not present

## 2018-08-14 DIAGNOSIS — E1165 Type 2 diabetes mellitus with hyperglycemia: Secondary | ICD-10-CM | POA: Diagnosis not present

## 2018-08-14 DIAGNOSIS — E038 Other specified hypothyroidism: Secondary | ICD-10-CM | POA: Diagnosis not present

## 2018-08-14 DIAGNOSIS — I10 Essential (primary) hypertension: Secondary | ICD-10-CM | POA: Diagnosis not present

## 2018-08-14 DIAGNOSIS — I5022 Chronic systolic (congestive) heart failure: Secondary | ICD-10-CM | POA: Diagnosis not present

## 2018-08-14 DIAGNOSIS — H35033 Hypertensive retinopathy, bilateral: Secondary | ICD-10-CM | POA: Diagnosis not present

## 2018-08-19 ENCOUNTER — Other Ambulatory Visit: Payer: Self-pay | Admitting: Student

## 2018-08-19 DIAGNOSIS — Z79899 Other long term (current) drug therapy: Secondary | ICD-10-CM

## 2018-08-19 NOTE — Telephone Encounter (Signed)
Returned pt call. Advised her that we sent in Imdur 60 mg- once daily. She is going to go to pharmacy to discuss.

## 2018-08-19 NOTE — Telephone Encounter (Signed)
Patient states she is supposed to be taking 60mg  of Isosorbide but RX was sent in for 30mg . Wants to know which she is supposed to be taking./ tg

## 2018-08-19 NOTE — Progress Notes (Signed)
Cardiology Office Note    Date:  08/20/2018   ID:  ZAHRAA BHARGAVA, DOB 05/05/1956, MRN [id number]  PCP:  Neale Burly, MD  Cardiologist: Carlyle Dolly, MD    Chief Complaint  Patient presents with  . Hospitalization Follow-up    History of Present Illness:    Maureen Jackson is a 62 y.o. female with past medical history of CAD (s/p DES to LAD and RCA in 09/2017), ischemic cardiomyopathy (EF 30-35% by echo in 09/2017), HTN, HLD, Type 2 DM OSA (intolerant to CPAP), and morbid obesity who presents to the office today for hospital follow-up.  He was recently admitted to [facility] from 07/16/2018 to 07/18/2018 for evaluation of new onset chest discomfort which radiated to her right arm with associated paresthesias. Cyclic troponin values were negative and her EKG showed no acute ischemic changes. Cardiology was consulted during admission and it was thought that her OM might be the culprit based off of her prior catheterization in 09/2017 but medical management had been recommended at that time. Imdur was increased to 60 mg daily during admission and she denied any recurrent chest discomfort after this adjustment.  In talking with the patient today, she reports overall doing well since her recent hospitalization. She denies any repeat episodes of chest pain. Has baseline dyspnea on exertion but denies any acute changes in this. No recent orthopnea, PND, lower extremity edema, or palpitations. Reports weight has overall been stable on her home scales. She has followed up with her PCP since hospital discharge and reports Synthroid dosing was adjusted at that time.  She does follow her blood pressure at home and says this has been well-controlled in the 120's-140's/ 70's-80's within the past few weeks. Reports good compliance with her current medication regimen.    Past Medical History:  Diagnosis Date  . 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 Coronary artery disease 09/2017   a. multivessel CAD by  cath in 09/2017 and not felt to be a CABG candidate --> underwent two-vessel PCI with DES to the LAD and DES to the RCA  . Diabetes mellitus   . Dyspnea   . Glaucoma   . Hypertension   . Morbid obesity ([place])   . Obstructive sleep apnea    does not wear CPAP  . Thyroid disease     Past Surgical History:  Procedure Laterality Date  . ABDOMINAL HYSTERECTOMY    . CARDIAC CATHETERIZATION  09/12/2017  . CORONARY STENT INTERVENTION  09/12/2017   STENT RESOLUTE ONYX 6.27O35 drug eluting stent was successfully placed  . CORONARY STENT INTERVENTION N/A 09/12/2017   Procedure: CORONARY STENT INTERVENTION;  Surgeon: Leonie Man, MD;  Location: [place] [facility];  Service: Cardiovascular;  Laterality: N/A;  . LEFT HEART CATH AND CORONARY ANGIOGRAPHY N/A 09/12/2017   Procedure: LEFT HEART CATH AND CORONARY ANGIOGRAPHY;  Surgeon: Leonie Man, MD;  Location: [place] [facility];  Service: Cardiovascular;  Laterality: N/A;  . RIGHT/LEFT HEART CATH AND CORONARY ANGIOGRAPHY N/A 09/10/2017   Procedure: RIGHT/LEFT HEART CATH AND CORONARY ANGIOGRAPHY;  Surgeon: Troy Sine, MD;  Location: [place] [facility];  Service: Cardiovascular;  Laterality: N/A;  . THYROID SURGERY      Current Medications: Outpatient Medications Prior to Visit  Medication Sig Dispense Refill  . amLODipine (NORVASC) 5 MG tablet Take 1 tablet (5 mg total) by mouth daily. 90 tablet 3  . aspirin EC 325 MG tablet Take 1 tablet (325  mg total) by mouth daily. 30 tablet 1  . furosemide (LASIX) 40 MG tablet Take 1 tablet (40 mg total) by mouth daily. 30 tablet 0  . insulin degludec (TRESIBA) 100 UNIT/ML SOPN FlexTouch Pen Inject 0.16 mLs (16 Units total) into the skin daily. Please include needles as well for injection 2 pen 0  . Insulin Pen Needle 32G X 4 MM MISC For use with lantus solostar-- change substitute if needed 100 each 0  . ONETOUCH VERIO test strip USE TO TEST BLOOD SUGAR BID  2  . VENTOLIN HFA 108 (90 Base) MCG/ACT  inhaler Inhale 1-2 puffs into the lungs every 6 (six) hours as needed for wheezing or shortness of breath. (Patient taking differently: Inhale 1-2 puffs into the lungs as needed for wheezing or shortness of breath. ) 1 Inhaler 0  . atorvastatin (LIPITOR) 80 MG tablet Take 1 tablet (80 mg total) by mouth daily at 6 PM. 90 tablet 3  . carvedilol (COREG) 25 MG tablet Take 1 tablet (25 mg total) by mouth 2 (two) times daily. 180 tablet 3  . clopidogrel (PLAVIX) 75 MG tablet Take 1 tablet (75 mg total) by mouth daily with breakfast. 90 tablet 3  . isosorbide mononitrate (IMDUR) 60 MG 24 hr tablet Take 1 tablet (60 mg total) by mouth daily. 30 tablet 0  . levothyroxine (SYNTHROID, LEVOTHROID) 175 MCG tablet Take 1 tablet (175 mcg total) by mouth daily before breakfast. 30 tablet 0  . sacubitril-valsartan (ENTRESTO) 97-103 MG Take 1 tablet by mouth 2 (two) times daily. 60 tablet 6  . spironolactone (ALDACTONE) 25 MG tablet Take 0.5 tablets (12.5 mg total) by mouth daily. 90 tablet 3  . levothyroxine (SYNTHROID, LEVOTHROID) 125 MCG tablet Take 125 mcg by mouth daily before breakfast.   0   No facility-administered medications prior to visit.      Allergies:   Aspirin   Social History   Socioeconomic History  . Marital status: Widowed    Spouse name: Not on file  . Number of children: Not on file  . Years of education: Not on file  . Highest education level: Not on file  Occupational History  . 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 Financial resource strain: Not on file  . Food insecurity:    Worry: Not on file    Inability: Not on file  . Transportation needs:    Medical: Not on file    Non-medical: Not on file  Tobacco Use  . Smoking status: Former Smoker    Last attempt to quit: 12/14/2005    Years since quitting: 12.6  . Smokeless tobacco: Never Used  . Tobacco comment: QUIT IN 2005  Substance and Sexual Activity  . Alcohol use: No  . Drug use: No    . Sexual activity: Never  Lifestyle  . Physical activity:    Days per week: Not on file    Minutes per session: Not on file  . Stress: Not on file  Relationships  . Social connections:    Talks on phone: Not on file    Gets together: Not on file    Attends religious service: Not on file    Active member of club or organization: Not on file    Attends meetings of clubs or organizations: Not on file    Relationship status: Not on file  Other Topics Concern  . Not on file  Social History Narrative  . Not on file  Family History:  The patient's family history includes Cancer in her mother; Diabetes in her mother; Hypertension in her mother and sister.   Review of Systems:   Please see the history of present illness.     General:  No chills, fever, night sweats or weight changes.  Cardiovascular:  No chest pain, edema, orthopnea, palpitations, paroxysmal nocturnal dyspnea. Positive for dyspnea on exertion (at baseline).  Dermatological: No rash, lesions/masses Respiratory: No cough, dyspnea Urologic: No hematuria, dysuria Abdominal:   No nausea, vomiting, diarrhea, bright red blood per rectum, melena, or hematemesis Neurologic:  No visual changes, wkns, changes in mental status. All other systems reviewed and are otherwise negative except as noted above.   Physical Exam:    VS:  BP 138/80   Pulse 70   Ht 5\' 5"  (1.651 m)   Wt (!) 310 lb (140.6 kg)   SpO2 97%   BMI 51.59 kg/m    General: Well developed, obese African American female appearing in no acute distress. Head: Normocephalic, atraumatic, sclera non-icteric, no xanthomas, nares are without discharge.  Neck: No carotid bruits. JVD difficult to assess secondary to body habitus. Lungs: Respirations regular and unlabored, without wheezes or rales.  Heart: Regular rate and rhythm. No S3 or S4.  No murmur, no rubs, or gallops appreciated. Abdomen: Soft, non-tender, non-distended with normoactive bowel sounds. No  hepatomegaly. No rebound/guarding. No obvious abdominal masses. Msk:  Strength and tone appear normal for age. No joint deformities or effusions. Extremities: No clubbing or cyanosis. No lower extremity edema.  Distal pedal pulses are 2+ bilaterally. Neuro: Alert and oriented X 3. Moves all extremities spontaneously. No focal deficits noted. Psych:  Responds to questions appropriately with a normal affect. Skin: No rashes or lesions noted  Wt Readings from Last 3 Encounters:  08/20/18 (!) 310 lb (140.6 kg)  07/18/18 (!) 304 lb (137.9 kg)  04/15/18 (!) 302 lb (137 kg)     Studies/Labs Reviewed:   EKG:  EKG is not ordered today.   Recent Labs: 03/14/2018: ALT 33 04/15/2018: Magnesium 2.1 07/16/2018: B Natriuretic Peptide 50.2 07/17/2018: TSH 0.181 07/18/2018: BUN 29; Creatinine, Ser 1.29; Hemoglobin 11.7; Platelets 210; Potassium 4.1; Sodium 139   Lipid Panel    Component Value Date/Time   CHOL 147 07/17/2018 0152   TRIG 167 (H) 07/17/2018 0152   HDL 31 (L) 07/17/2018 0152   CHOLHDL 4.7 07/17/2018 0152   VLDL 33 07/17/2018 0152   LDLCALC 83 07/17/2018 0152    Additional studies/ records that were reviewed today include:   Cardiac Catheterization: 09/2017  LESION #1  Prox LAD lesion, 60 %stenosed. Prox LAD to Mid LAD lesion, 65 %stenosed. Mid LAD lesion, 85 %stenosed. Tandem Lesions.  A STENT RESOLUTE ONYX [id number] drug eluting stent was successfully placed. Post-dilated in tapered fashion: 3.6 - 2.9 mm  Post intervention, there is a 0% residual stenosis.  LESION $2  Prox RCA lesion, 45 %stenosed. Prox RCA to Mid RCA lesion, 70 %stenosed. Mid RCA lesion, 90 %stenosed. Tandem Lesions  A STENT RESOLUTE ONYX [id number] drug eluting stent and STENT RESOLUTE ONYX 2.75X26 drug-eluting stent were successfully placed in overlapping fashion.  Post intervention, there is a 0% residual stenosis. Post-dilated in tapered fashion: 3.6 - 3.1 mm  RPDA lesion, 80 %stenosed. Plan Medical  management.   Successful 2 vessel PCI with stable circumflex disease. Plan is to treat circumflex flex disease medically for now.   Plan: Return to nursing unit for post PCI care TR band removal. Continue  to optimize medical management, but from a post Standpoint she will be able to be discharged tomorrow. Continue aspirin plus Plavix for minimum one year, but would continue Plavix lifelong   Limited Echo: 09/2017 Study Conclusions  - Impressions: Compared to echo 09/10/17   Limited with no color flow and limited Doppler for respiratory   variation   Moderate LVE with diffuse hypokinesis worse in inferior wall EF   30-35%   Moderate pericardial effusion unchanged with no signs of   tamponade  Impressions:  - Compared to echo 09/10/17   Limited with no color flow and limited Doppler for respiratory   variation   Moderate LVE with diffuse hypokinesis worse in inferior wall EF   30-35%   Moderate pericardial effusion unchanged with no signs of   tamponade  Assessment:    1. Coronary artery disease involving native coronary artery of native heart without angina pectoris   2. Chronic combined systolic and diastolic CHF (congestive heart failure) ([place])   3. Ischemic cardiomyopathy   4. Essential hypertension   5. Hyperlipidemia LDL goal <70      Plan:   In order of problems listed above:  1. CAD -The patient has known severe multivessel CAD by cardiac catheterization in 09/2017 at which time she was not felt to be a candidate for CABG and underwent two-vessel PCI with DES to the LAD and DES to the RCA with medical management recommended of the obtuse marginal and RPDA.  - She denies any recurrent chest discomfort since her recent hospitalization and the titration of Imdur. - Continue ASA (on 325mg  dosing per Neurology by review of notes in 03/2018), Plavix 75mg  daily, Coreg 25mg  BID, and Imdur 60mg  daily along with statin therapy.   2. Chronic Combined Systolic and  Diastolic CHF/ Ischemic Cardiomyopathy - The patient has a known reduced EF of 30 to 35% by her most recent echocardiogram in 09/2017. It does not appear that she has had repeat imaging since. Will plan for a repeat study to assess EF as she is on appropriate medical therapy. - Continue Carvedilol, Entresto, and Spironolactone. Can further titrate Spironolactone to 25 mg daily or Coreg to 37.5mg  BID (as weight is greater than 85 kg) if EF remains reduced.  3. HTN - BP is well controlled at 138/80 during today's visit. - Continue Amlodipine 5 mg daily, Carvedilol 25 mg twice daily, Imdur 60 mg daily, Entresto 97-103mg  BID, and Spironolactone 12.5mg  daily.   4. HLD - FLP in 07/2018 showed total cholesterol 147, HDL 31, and LDL 83.  She reported at the time of her recent hospitalization that she had not been taking her Lipitor due to the Rx not being sent in. This was restarted at the time of hospital discharge. - continue Lipitor 80mg  daily. Will need repeat FLP and LFT's at the time of her next office visit if not obtained by her PCP in the interim.     Medication Adjustments/Labs and Tests Ordered: Current medicines are reviewed at length with the patient today.  Concerns regarding medicines are outlined above.  Medication changes, Labs and Tests ordered today are listed in the Patient Instructions below. Patient Instructions  Medication Instructions:   REFILLS WERE SENT IN   YOU WERE GIVEN A PRESCRIPTION FOR COMPRESSION STOCKINGS   Labwork: NONE  Testing/Procedures: Your physician has requested that you have an echocardiogram. Echocardiography is a painless test that uses sound waves to create images of your heart. It provides your doctor with information about  the size and shape of your heart and how well your heart's chambers and valves are working. This procedure takes approximately one hour. There are no restrictions for this procedure.  Follow-Up: Your physician recommends that you  schedule a follow-up appointment in: 3-4 MONTHS   Any Other Special Instructions Will Be Listed Below (If Applicable).  If you need a refill on your cardiac medications before your next appointment, please call your pharmacy.    Signed, Erma Heritage, PA-C  08/20/2018 4:25 PM    [facility] [address] [place], [place] [address] Phone: [phone]

## 2018-08-20 ENCOUNTER — Ambulatory Visit (INDEPENDENT_AMBULATORY_CARE_PROVIDER_SITE_OTHER): Payer: Medicare HMO | Admitting: Student

## 2018-08-20 ENCOUNTER — Encounter: Payer: Self-pay | Admitting: Student

## 2018-08-20 VITALS — BP 138/80 | HR 70 | Ht 65.0 in | Wt 310.0 lb

## 2018-08-20 DIAGNOSIS — I255 Ischemic cardiomyopathy: Secondary | ICD-10-CM | POA: Diagnosis not present

## 2018-08-20 DIAGNOSIS — I2510 Atherosclerotic heart disease of native coronary artery without angina pectoris: Secondary | ICD-10-CM | POA: Diagnosis not present

## 2018-08-20 DIAGNOSIS — I5042 Chronic combined systolic (congestive) and diastolic (congestive) heart failure: Secondary | ICD-10-CM

## 2018-08-20 DIAGNOSIS — I10 Essential (primary) hypertension: Secondary | ICD-10-CM | POA: Diagnosis not present

## 2018-08-20 DIAGNOSIS — E785 Hyperlipidemia, unspecified: Secondary | ICD-10-CM

## 2018-08-20 MED ORDER — SPIRONOLACTONE 25 MG PO TABS: 12.5000 mg | ORAL_TABLET | Freq: Every day | ORAL | 3 refills | Status: DC

## 2018-08-20 MED ORDER — SACUBITRIL-VALSARTAN 97-103 MG PO TABS: 1.0000 | ORAL_TABLET | Freq: Two times a day (BID) | ORAL | 3 refills | Status: DC

## 2018-08-20 MED ORDER — CLOPIDOGREL BISULFATE 75 MG PO TABS: 75.0000 mg | ORAL_TABLET | Freq: Every day | ORAL | 3 refills | Status: DC

## 2018-08-20 MED ORDER — CARVEDILOL 25 MG PO TABS: 25.0000 mg | ORAL_TABLET | Freq: Two times a day (BID) | ORAL | 3 refills | Status: DC

## 2018-08-20 MED ORDER — ATORVASTATIN CALCIUM 80 MG PO TABS: 80.0000 mg | ORAL_TABLET | Freq: Every day | ORAL | 3 refills | Status: DC

## 2018-08-20 MED ORDER — ISOSORBIDE MONONITRATE ER 60 MG PO TB24: 60.0000 mg | ORAL_TABLET | Freq: Every day | ORAL | 3 refills | Status: DC

## 2018-08-20 NOTE — Patient Instructions (Signed)
Medication Instructions:   REFILLS WERE SENT IN   YOU WERE GIVEN A PRESCRIPTION FOR COMPRESSION STOCKINGS   Labwork: NONE  Testing/Procedures: Your physician has requested that you have an echocardiogram. Echocardiography is a painless test that uses sound waves to create images of your heart. It provides your doctor with information about the size and shape of your heart and how well your heart's chambers and valves are working. This procedure takes approximately one hour. There are no restrictions for this procedure.    Follow-Up: Your physician recommends that you schedule a follow-up appointment in: 3-4 MONTHS    Any Other Special Instructions Will Be Listed Below (If Applicable).     If you need a refill on your cardiac medications before your next appointment, please call your pharmacy.

## 2018-08-24 ENCOUNTER — Other Ambulatory Visit: Payer: Self-pay

## 2018-08-24 ENCOUNTER — Encounter (HOSPITAL_COMMUNITY): Payer: Self-pay | Admitting: Emergency Medicine

## 2018-08-24 ENCOUNTER — Emergency Department (HOSPITAL_COMMUNITY)
Admission: EM | Admit: 2018-08-24 | Discharge: 2018-08-24 | Disposition: A | Discharge status: Home / Self Care | Payer: Medicare HMO | Attending: Emergency Medicine | Admitting: Emergency Medicine

## 2018-08-24 DIAGNOSIS — Z794 Long term (current) use of insulin: Secondary | ICD-10-CM | POA: Insufficient documentation

## 2018-08-24 DIAGNOSIS — E039 Hypothyroidism, unspecified: Secondary | ICD-10-CM | POA: Insufficient documentation

## 2018-08-24 DIAGNOSIS — I5042 Chronic combined systolic (congestive) and diastolic (congestive) heart failure: Secondary | ICD-10-CM | POA: Diagnosis not present

## 2018-08-24 DIAGNOSIS — Z87891 Personal history of nicotine dependence: Secondary | ICD-10-CM | POA: Insufficient documentation

## 2018-08-24 DIAGNOSIS — Z7982 Long term (current) use of aspirin: Secondary | ICD-10-CM | POA: Insufficient documentation

## 2018-08-24 DIAGNOSIS — K1370 Unspecified lesions of oral mucosa: Secondary | ICD-10-CM | POA: Diagnosis present

## 2018-08-24 DIAGNOSIS — E119 Type 2 diabetes mellitus without complications: Secondary | ICD-10-CM | POA: Insufficient documentation

## 2018-08-24 DIAGNOSIS — K148 Other diseases of tongue: Secondary | ICD-10-CM

## 2018-08-24 DIAGNOSIS — I110 Hypertensive heart disease with heart failure: Secondary | ICD-10-CM | POA: Insufficient documentation

## 2018-08-24 DIAGNOSIS — Z79899 Other long term (current) drug therapy: Secondary | ICD-10-CM | POA: Diagnosis not present

## 2018-08-24 MED ORDER — LIDOCAINE-EPINEPHRINE (PF) 2 %-1:200000 IJ SOLN
5.0000 mL | Freq: Once | INTRAMUSCULAR | Status: AC
  Administered 2018-08-24: 5 mL
  Filled 2018-08-24: qty 20

## 2018-08-24 NOTE — ED Provider Notes (Signed)
[facility] EMERGENCY DEPARTMENT Provider Note   CSN: [id number] Arrival date & time: 08/24/18  1235     History   Chief Complaint Chief Complaint  Patient presents with  . Coagulation Disorder    HPI Maureen Jackson is a 62 y.o. female.  HPI   62 year old female with bleeding from a lesion near the tip of her tongue.  She states that she was eating ice cream a little bit before arrival and noticed blood in it as she was eating it.  It was coming from "a bump" that she first noticed a couple weeks ago.  She denies biting her tongue or other trauma.  This "bump" has not been painful.  No bleeding from it prior to today.  She is on Plavix.  Past Medical History:  Diagnosis Date  . 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 Coronary artery disease 09/2017   a. multivessel CAD by cath in 09/2017 and not felt to be a CABG candidate --> underwent two-vessel PCI with DES to the LAD and DES to the RCA  . Diabetes mellitus   . Dyspnea   . Glaucoma   . Hypertension   . Morbid obesity ([place])   . Obstructive sleep apnea    does not wear CPAP  . Thyroid disease     Patient Active Problem List   Diagnosis Date Noted  . Chest pain 07/16/2018  . Hyperlipidemia 04/10/2018  . TIA (transient ischemic attack) 03/14/2018  . Vertigo 03/14/2018  . Chronic combined systolic and diastolic CHF (congestive heart failure) ([place]) 03/14/2018  . Ischemic cardiomyopathy 11/06/2017  . CAD, multiple vessel   . Pericardial effusion 09/07/2017  . AKI (acute kidney injury) ([place]) 09/07/2017  . Elevated troponin I level 09/07/2017  . Acute combined systolic (congestive) and diastolic (congestive) heart failure ([place]) 09/06/2017  . Cardiomegaly 09/05/2017  . Morbid obesity ([place]) 03/04/2013  . Labyrinthitis 03/04/2013  . Diabetes mellitus type 2 with complications, uncontrolled ([place]) 03/04/2013  . GASTRITIS 12/13/2006  . HIATAL HERNIA, HX OF 12/13/2006  . THYROIDECTOMY, HX OF 12/13/2006  . Hypothyroidism  10/04/2006  . OBESITY 10/04/2006  . TOBACCO ABUSE 10/04/2006  . CARPAL TUNNEL SYNDROME 10/04/2006  . Essential hypertension 10/04/2006  . GERD 10/04/2006  . POSTMENOPAUSAL STATUS 10/04/2006  . SKIN TAG 10/04/2006  . KNEE PAIN, LEFT 10/04/2006  . Sleep apnea 10/04/2006  . LEG EDEMA 10/04/2006    Past Surgical History:  Procedure Laterality Date  . ABDOMINAL HYSTERECTOMY    . CARDIAC CATHETERIZATION  09/12/2017  . CORONARY STENT INTERVENTION  09/12/2017   STENT RESOLUTE ONYX 9.93T70 drug eluting stent was successfully placed  . CORONARY STENT INTERVENTION N/A 09/12/2017   Procedure: CORONARY STENT INTERVENTION;  Surgeon: Leonie Man, MD;  Location: [place] [facility];  Service: Cardiovascular;  Laterality: N/A;  . LEFT HEART CATH AND CORONARY ANGIOGRAPHY N/A 09/12/2017   Procedure: LEFT HEART CATH AND CORONARY ANGIOGRAPHY;  Surgeon: Leonie Man, MD;  Location: [place] [facility];  Service: Cardiovascular;  Laterality: N/A;  . RIGHT/LEFT HEART CATH AND CORONARY ANGIOGRAPHY N/A 09/10/2017   Procedure: RIGHT/LEFT HEART CATH AND CORONARY ANGIOGRAPHY;  Surgeon: Troy Sine, MD;  Location: [place] [facility];  Service: Cardiovascular;  Laterality: N/A;  . THYROID SURGERY       OB History   None      Home Medications    Prior to Admission medications   Medication Sig Start Date End Date Taking? Authorizing Provider  amLODipine (NORVASC) 5 MG tablet Take 1 tablet (5 mg total) by mouth daily. 11/07/17   [name], PA-C  aspirin EC 325 MG tablet Take 1 tablet (325 mg total) by mouth daily. 03/15/18   Barton Dubois, MD  atorvastatin (LIPITOR) 80 MG tablet Take 1 tablet (80 mg total) by mouth daily at 6 PM. 08/20/18   Strader, [place] M, PA-C  carvedilol (COREG) 25 MG tablet Take 1 tablet (25 mg total) by mouth 2 (two) times daily. 08/20/18 11/18/18  Erma Heritage, PA-C  clopidogrel (PLAVIX) 75 MG tablet Take 1 tablet (75 mg total) by mouth daily with breakfast.  08/20/18   Strader, Fransisco Hertz, PA-C  furosemide (LASIX) 40 MG tablet Take 1 tablet (40 mg total) by mouth daily. 09/14/17   Ghimire, Henreitta Leber, MD  insulin degludec (TRESIBA) 100 UNIT/ML SOPN FlexTouch Pen Inject 0.16 mLs (16 Units total) into the skin daily. Please include needles as well for injection 07/18/18   Eulogio Bear U, DO  Insulin Pen Needle 32G X 4 MM MISC For use with lantus solostar-- change substitute if needed 07/18/18   Geradine Girt, DO  isosorbide mononitrate (IMDUR) 60 MG 24 hr tablet Take 1 tablet (60 mg total) by mouth daily. 08/20/18   Strader, Fransisco Hertz, PA-C  levothyroxine (SYNTHROID, LEVOTHROID) 125 MCG tablet Take 125 mcg by mouth daily before breakfast.  08/16/18   [provider]  Glory Rosebush VERIO test strip USE TO TEST BLOOD SUGAR BID 01/05/18   [provider]  sacubitril-valsartan (ENTRESTO) 97-103 MG Take 1 tablet by mouth 2 (two) times daily. 08/20/18   Strader, Fransisco Hertz, PA-C  spironolactone (ALDACTONE) 25 MG tablet Take 0.5 tablets (12.5 mg total) by mouth daily. 08/20/18   Strader, Fransisco Hertz, PA-C  VENTOLIN HFA 108 (90 Base) MCG/ACT inhaler Inhale 1-2 puffs into the lungs every 6 (six) hours as needed for wheezing or shortness of breath. Patient taking differently: Inhale 1-2 puffs into the lungs as needed for wheezing or shortness of breath.  09/14/17   Ghimire, Henreitta Leber, MD    Family History Family History  Problem Relation Age of Onset  . Diabetes Mother   . Hypertension Mother   . Cancer Mother        pancreas  . Hypertension Sister     Social History Social History   Tobacco Use  . Smoking status: Former Smoker    Last attempt to quit: 12/14/2005    Years since quitting: 12.7  . Smokeless tobacco: Never Used  . Tobacco comment: QUIT IN 2005  Substance Use Topics  . Alcohol use: No  . Drug use: No     Allergies   Aspirin   Review of Systems Review of Systems  All systems reviewed and negative, other than as noted in  HPI.  Physical Exam Updated Vital Signs BP (!) 119/102 (BP Location: Right Arm)   Pulse 84   Temp 98 F (36.7 C) (Oral)   Resp 20   Ht 5\' 5"  (1.651 m)   Wt (!) 140.2 kg   SpO2 97%   BMI 51.42 kg/m   Physical Exam  Constitutional: She appears well-developed and well-nourished. No distress.  HENT:  Head: Normocephalic.  Mouth/Throat:    Small area near tip of tongue L of midline. Minimal bleeding and is superficial. Appears like may simply be from mild trauma.   Eyes: Conjunctivae are normal. Right eye exhibits no discharge. Left eye exhibits no discharge.  Neck: Neck supple.  Cardiovascular: Normal rate, regular rhythm and normal heart sounds. Exam reveals no gallop and no friction rub.  No murmur heard. Pulmonary/Chest: Effort normal and breath sounds normal. No respiratory distress.  Abdominal: Soft. She exhibits no distension. There is no tenderness.  Musculoskeletal: She exhibits no edema or tenderness.  Neurological: She is alert.  Skin: Skin is warm and dry.  Psychiatric: She has a normal mood and affect. Her behavior is normal. Thought content normal.  Nursing note and vitals reviewed.    ED Treatments / Results  Labs (all labs ordered are listed, but only abnormal results are displayed) Labs Reviewed - No data to display  EKG None  Radiology No results found.  Procedures Procedures (including critical care time)  Medications Ordered in ED Medications  lidocaine-EPINEPHrine (XYLOCAINE W/EPI) 2 %-1:200000 (PF) injection 5 mL (has no administration in time range)     Initial Impression / Assessment and Plan / ED Course  I have reviewed the triage vital signs and the nursing notes.  Pertinent labs & imaging results that were available during my care of the patient were reviewed by me and considered in my medical decision making (see chart for details).     62 year old female with small amount of bleeding from a tongue. This was controlled.  It's  appearance is that of mild trauma and appears superficial. She denies trauma though and says it has been persistent for a couple weeks.  She had a distant smoking history.  She states she never used chewing tobacco.  Advised ENT follow-up for further evaluation if it persists.   Final Clinical Impressions(s) / ED Diagnoses   Final diagnoses:  Lesion of tongue    ED Discharge Orders    None       Virgel Manifold, MD 08/25/18 1247

## 2018-08-24 NOTE — ED Triage Notes (Signed)
Patient bleeding from tongue. Per patient had a "bump" on tongue that started bleeding while eating. Patient states bleeding x15 minutes with some clots. Patient is on an anticoagulant but unsure of the name. Prior chart indicates Plavix.

## 2018-08-24 NOTE — Discharge Instructions (Signed)
If this area starts bleeding again then apply direct continuous pressure for 15 minutes. Try sucking on ice if the bleeding persists. If it is still bleeding significantly after this then come to the ER. I would like you to follow-up with ENT so they can have a look at it.

## 2018-08-27 ENCOUNTER — Other Ambulatory Visit (HOSPITAL_COMMUNITY): Payer: Medicare HMO

## 2018-09-02 ENCOUNTER — Ambulatory Visit (HOSPITAL_COMMUNITY)
Admission: RE | Admit: 2018-09-02 | Discharge: 2018-09-02 | Disposition: A | Discharge status: Home / Self Care | Payer: Medicare HMO | Source: Ambulatory Visit | Attending: Student | Admitting: Student

## 2018-09-02 DIAGNOSIS — E119 Type 2 diabetes mellitus without complications: Secondary | ICD-10-CM | POA: Diagnosis not present

## 2018-09-02 DIAGNOSIS — I255 Ischemic cardiomyopathy: Secondary | ICD-10-CM | POA: Insufficient documentation

## 2018-09-02 DIAGNOSIS — I5042 Chronic combined systolic (congestive) and diastolic (congestive) heart failure: Secondary | ICD-10-CM | POA: Diagnosis not present

## 2018-09-02 DIAGNOSIS — Z8673 Personal history of transient ischemic attack (TIA), and cerebral infarction without residual deficits: Secondary | ICD-10-CM | POA: Insufficient documentation

## 2018-09-02 DIAGNOSIS — E785 Hyperlipidemia, unspecified: Secondary | ICD-10-CM | POA: Diagnosis not present

## 2018-09-02 DIAGNOSIS — I110 Hypertensive heart disease with heart failure: Secondary | ICD-10-CM | POA: Insufficient documentation

## 2018-09-02 DIAGNOSIS — I313 Pericardial effusion (noninflammatory): Secondary | ICD-10-CM | POA: Insufficient documentation

## 2018-09-02 DIAGNOSIS — I25119 Atherosclerotic heart disease of native coronary artery with unspecified angina pectoris: Secondary | ICD-10-CM | POA: Insufficient documentation

## 2018-09-02 NOTE — Progress Notes (Signed)
*  PRELIMINARY RESULTS* Echocardiogram 2D Echocardiogram has been performed.    I explained to the patient that she needed definity, that her EF may not be accurately assessed and that she had had definity before. She refused and said she didn't remember having it. I clarified with her she may have to come back per the doctor and have a limited with definity. She said that was fine, she didn't want the medicine.     Leavy Cella 09/02/2018, 11:46 AM

## 2018-09-10 ENCOUNTER — Telehealth: Payer: Self-pay | Admitting: *Deleted

## 2018-09-10 MED ORDER — SPIRONOLACTONE 25 MG PO TABS: 25.0000 mg | ORAL_TABLET | Freq: Every day | ORAL | 3 refills | Status: DC

## 2018-09-10 NOTE — Telephone Encounter (Signed)
-----   Message from Erma Heritage, [place] sent at 09/10/2018  7:13 AM EDT ----- Please let the patient know that her most recent echocardiogram shows that EF remains reduced in a range of 30 to 40%. This is similar to echocardiogram imaging in 09/2017 as EF was 30 to 35% at that time. Given that this remains reduced, would recommend increasing Spironolactone to 25mg  daily. Would need a repeat BMET in 2 weeks to reassess kidney function and electrolytes with the dose change. Keep scheduled follow-up with Dr. Harl Bowie. Please forward a copy to Neale Burly, MD.

## 2018-09-10 NOTE — Telephone Encounter (Signed)
-----   Message from Erma Heritage, [place] sent at 09/03/2018 10:55 AM EDT ----- Please let the patient know that her most recent echocardiogram shows that EF remains reduced in a range of 30 to 40%. This is similar to echocardiogram imaging in 09/2017 as EF was 30 to 35% at that time. Given that this remains reduced, would recommend increasing Spironolactone to 25mg  daily. Would need a repeat BMET in 2 weeks to reassess kidney function and electrolytes with the dose change. Keep scheduled follow-up with Dr. Harl Bowie. Please forward a copy to Neale Burly, MD.

## 2018-09-10 NOTE — Telephone Encounter (Signed)
Spoke with pt. Went over results. Made her aware of medication change and to have labs done in 2 weeks. She voiced understanding.

## 2018-09-10 NOTE — Telephone Encounter (Signed)
Called patient with test results. No answer. Left message to call back.  

## 2018-11-11 ENCOUNTER — Ambulatory Visit (INDEPENDENT_AMBULATORY_CARE_PROVIDER_SITE_OTHER): Payer: Medicare HMO | Admitting: Otolaryngology

## 2018-11-11 DIAGNOSIS — D3709 Neoplasm of uncertain behavior of other specified sites of the oral cavity: Secondary | ICD-10-CM

## 2018-11-12 ENCOUNTER — Other Ambulatory Visit: Payer: Self-pay | Admitting: Physician Assistant

## 2018-11-20 ENCOUNTER — Other Ambulatory Visit: Payer: Self-pay | Admitting: Otolaryngology

## 2018-11-21 ENCOUNTER — Other Ambulatory Visit: Payer: Self-pay

## 2018-11-21 MED ORDER — CARVEDILOL 25 MG PO TABS: 25.0000 mg | ORAL_TABLET | Freq: Two times a day (BID) | ORAL | 3 refills | Status: DC

## 2018-11-29 ENCOUNTER — Ambulatory Visit (INDEPENDENT_AMBULATORY_CARE_PROVIDER_SITE_OTHER): Payer: Medicare HMO | Admitting: Cardiology

## 2018-11-29 ENCOUNTER — Encounter: Payer: Self-pay | Admitting: Cardiology

## 2018-11-29 VITALS — BP 174/88 | HR 75 | Ht 65.0 in | Wt 326.0 lb

## 2018-11-29 DIAGNOSIS — I10 Essential (primary) hypertension: Secondary | ICD-10-CM | POA: Diagnosis not present

## 2018-11-29 DIAGNOSIS — I2510 Atherosclerotic heart disease of native coronary artery without angina pectoris: Secondary | ICD-10-CM

## 2018-11-29 DIAGNOSIS — I5022 Chronic systolic (congestive) heart failure: Secondary | ICD-10-CM | POA: Diagnosis not present

## 2018-11-29 NOTE — Progress Notes (Signed)
Clinical Summary Maureen Jackson is a 62 y.o.female seen today for follow up of the following medical problems.     1. Chronic systolic HF/ICM/CAD - patient admitted with acute onset CHF 08/2017.  - echo 08/2017 LVEF 30-35%, grade I diastoilc dysfunction - 09/2017 cath as reported below, found to have severe multivessel disease of LAD, LCX, and RCA. Mean PA 33, PCWP 25, CI 2.7 - seen by CT surgery, recs were for PCI as opposed to CABG. Received DES to LAD and DES x 2 to RCA, LCX disease managed medically. Recs for lifelong plavix per interventional cards. Has been on high dose ASA per neurology.  -discharge weight 09/13/17 312 lbs    08/2018 echo limited visually, LVEF 30-40% - home weights 316 lbs. She is unsure of trend.  - no recent edema. No significant SOB/DOE - compliant with meds.  - no recent chest pain    2. Hypothyroidism - replacement therapy per pcp    Past Medical History:  Diagnosis Date  . 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 Coronary artery disease 09/2017   a. multivessel CAD by cath in 09/2017 and not felt to be a CABG candidate --> underwent two-vessel PCI with DES to the LAD and DES to the RCA  . Diabetes mellitus   . Dyspnea   . Glaucoma   . Hypertension   . Morbid obesity ([place])   . Obstructive sleep apnea    does not wear CPAP  . Thyroid disease      Allergies  Allergen Reactions  . Aspirin Nausea Only     Current Outpatient Medications  Medication Sig Dispense Refill  . amLODipine (NORVASC) 5 MG tablet TAKE 1 TABLET BY MOUTH EVERY DAY 90 tablet 3  . aspirin EC 325 MG tablet Take 1 tablet (325 mg total) by mouth daily. 30 tablet 1  . atorvastatin (LIPITOR) 80 MG tablet Take 1 tablet (80 mg total) by mouth daily at 6 PM. 90 tablet 3  . carvedilol (COREG) 25 MG tablet Take 1 tablet (25 mg total) by mouth 2 (two) times daily. 180 tablet 3  . clopidogrel (PLAVIX) 75 MG tablet Take 1 tablet (75 mg total) by mouth daily with breakfast. 90  tablet 3  . furosemide (LASIX) 40 MG tablet Take 1 tablet (40 mg total) by mouth daily. 30 tablet 0  . insulin aspart (NOVOLOG) 100 UNIT/ML injection Inject 5 Units into the skin 3 (three) times daily before meals.    . insulin degludec (TRESIBA) 100 UNIT/ML SOPN FlexTouch Pen Inject 0.16 mLs (16 Units total) into the skin daily. Please include needles as well for injection 2 pen 0  . Insulin Pen Needle 32G X 4 MM MISC For use with lantus solostar-- change substitute if needed 100 each 0  . isosorbide mononitrate (IMDUR) 60 MG 24 hr tablet Take 1 tablet (60 mg total) by mouth daily. 90 tablet 3  . levothyroxine (SYNTHROID, LEVOTHROID) 125 MCG tablet Take 125 mcg by mouth daily before breakfast.   0  . ONETOUCH VERIO test strip USE TO TEST BLOOD SUGAR BID  2  . sacubitril-valsartan (ENTRESTO) 97-103 MG Take 1 tablet by mouth 2 (two) times daily. 180 tablet 3  . spironolactone (ALDACTONE) 25 MG tablet Take 1 tablet (25 mg total) by mouth daily. 90 tablet 3  . VENTOLIN HFA 108 (90 Base) MCG/ACT inhaler Inhale 1-2 puffs into the lungs every 6 (six) hours as needed for wheezing or shortness of  breath. (Patient taking differently: Inhale 1-2 puffs into the lungs as needed for wheezing or shortness of breath. ) 1 Inhaler 0   No current facility-administered medications for this visit.      Past Surgical History:  Procedure Laterality Date  . ABDOMINAL HYSTERECTOMY    . CARDIAC CATHETERIZATION  09/12/2017  . CORONARY STENT INTERVENTION  09/12/2017   STENT RESOLUTE ONYX [id number] drug eluting stent was successfully placed  . CORONARY STENT INTERVENTION N/A 09/12/2017   Procedure: CORONARY STENT INTERVENTION;  Surgeon: Leonie Man, MD;  Location: [facility];  Service: Cardiovascular;  Laterality: N/A;  . LEFT HEART CATH AND CORONARY ANGIOGRAPHY N/A 09/12/2017   Procedure: LEFT HEART CATH AND CORONARY ANGIOGRAPHY;  Surgeon: Leonie Man, MD;  Location: [facility];  Service:  Cardiovascular;  Laterality: N/A;  . RIGHT/LEFT HEART CATH AND CORONARY ANGIOGRAPHY N/A 09/10/2017   Procedure: RIGHT/LEFT HEART CATH AND CORONARY ANGIOGRAPHY;  Surgeon: Troy Sine, MD;  Location: [facility];  Service: Cardiovascular;  Laterality: N/A;  . THYROID SURGERY       Allergies  Allergen Reactions  . Aspirin Nausea Only      Family History  Problem Relation Age of Onset  . Diabetes Mother   . Hypertension Mother   . Cancer Mother        pancreas  . Hypertension Sister      Social History Maureen Jackson reports that she quit smoking about 12 years ago. She has never used smokeless tobacco. Maureen Jackson reports no history of alcohol use.   Review of Systems CONSTITUTIONAL: No weight loss, fever, chills, weakness or fatigue.  HEENT: Eyes: No visual loss, blurred vision, double vision or yellow sclerae.No hearing loss, sneezing, congestion, runny nose or sore throat.  SKIN: No rash or itching.  CARDIOVASCULAR: per hpi RESPIRATORY: No shortness of breath, cough or sputum.  GASTROINTESTINAL: No anorexia, nausea, vomiting or diarrhea. No abdominal pain or blood.  GENITOURINARY: No burning on urination, no polyuria NEUROLOGICAL: No headache, dizziness, syncope, paralysis, ataxia, numbness or tingling in the extremities. No change in bowel or bladder control.  MUSCULOSKELETAL: No muscle, back pain, joint pain or stiffness.  LYMPHATICS: No enlarged nodes. No history of splenectomy.  PSYCHIATRIC: No history of depression or anxiety.  ENDOCRINOLOGIC: No reports of sweating, cold or heat intolerance. No polyuria or polydipsia.  Marland Kitchen   Physical Examination Vitals:   11/29/18 1255  BP: (!) 174/88  Pulse: 75  SpO2: 98%   Vitals:   11/29/18 1255  Weight: (!) 326 lb (147.9 kg)  Height: 5\' 5"  (1.651 m)    Gen: resting comfortably, no acute distress HEENT: no scleral icterus, pupils equal round and reactive, no palptable cervical adenopathy,  CV: RRR, no m/r/g,  no jvd Resp: Clear to auscultation bilaterally GI: abdomen is soft, non-tender, non-distended, normal bowel sounds, no hepatosplenomegaly MSK: extremities are warm, no edema.  Skin: warm, no rash Neuro:  no focal deficits Psych: appropriate affect   Diagnostic Studies 10/2018diagnosticcath  Prox RCA lesion, 30 %stenosed.  Prox RCA to Mid RCA lesion, 70 %stenosed.  Mid RCA lesion, 90 %stenosed.  RPDA lesion, 80 %stenosed.  Prox Cx lesion, 20 %stenosed.  Ost 1st Mrg lesion, 80 %stenosed.  1st Mrg-2 lesion, 80 %stenosed.  1st Mrg-1 lesion, 90 %stenosed.  Mid Cx-2 lesion, 50 %stenosed.  Mid Cx-1 lesion, 60 %stenosed.  Prox LAD lesion, 60 %stenosed.  Mid LAD lesion, 85 %stenosed.  Severe multivessel CAD with 60% proximal LAD  stenosis and 85% mid LAD stenosis; 80, 90 and 80% OM1 stenosis of the left circumflex with 60% mid AV groove and 50% mid distal AV groove stenosis; dominant RCA with 30% proximal followed by 70% proximal stenosis, 90% mid stenosis, and diffuse mid distal PDA stenosis of at least 80%.  LVEDP 33 mmHg; EF by previous echo 30-35%; left ventriculography not performed.  Moderate right heart pressure elevation with mild/moderate pulmonary hypertension.  RECOMMENDATION: In this diabetic female with significant multivessel CAD, recommend surgical consultation for consideration for CABG revascularization.  09/2017 PCI  LESION #1  Prox LAD lesion, 60 %stenosed. Prox LAD to Mid LAD lesion, 65 %stenosed. Mid LAD lesion, 85 %stenosed. Tandem Lesions.  A STENT RESOLUTE ONYX [id number] drug eluting stent was successfully placed. Post-dilated in tapered fashion: 3.6 - 2.9 mm  Post intervention, there is a 0% residual stenosis.  LESION $2  Prox RCA lesion, 45 %stenosed. Prox RCA to Mid RCA lesion, 70 %stenosed. Mid RCA lesion, 90 %stenosed. Tandem Lesions  A STENT RESOLUTE ONYX [id number] drug eluting stent and STENT RESOLUTE ONYX [id number] drug-eluting stent  were successfully placed in overlapping fashion.  Post intervention, there is a 0% residual stenosis. Post-dilated in tapered fashion: 3.6 - 3.1 mm  RPDA lesion, 80 %stenosed. Plan Medical management.  Successful 2 vessel PCI with stable circumflex disease. Plan is to treat circumflex flex disease medically for now.   Plan: Return to nursing unit for post PCI care TR band removal. Continue to optimize medical management, but from a post Standpoint she will be able to be discharged tomorrow. Continue aspirin plus Plavix for minimum one year, but would continue Plavix lifelong     08/2017 echo Study Conclusions  - Procedure narrative: Transthoracic echocardiography. Image quality was fair. The study was technically difficult, as a result of poor sound wave transmission. Intravenous contrast (Definity) was administered. - Left ventricle: The cavity size was mildly dilated. Systolic function was moderately to severely reduced. The estimated ejection fraction was in the range of 30% to 35%. Diffuse hypokinesis. Doppler parameters are consistent with abnormal left ventricular relaxation (grade 1 diastolic dysfunction). Mild concentric and moderate focal basal septal hypertrophy. - Ventricular septum: Septal motion showed abnormal function and dyssynergy. 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 - Aortic valve: Mildly calcified annulus. - Systemic veins: IVC is dilated with normal respiratory variation. It was suboptimally visualized. Estimated right atrial pressure: 8 mmHg. - Pericardium, extracardiac: Moderate size pericardial effusion. There is some right atrial inversion noted, but no frank tamponade physiology.   08/2018 echo Study Conclusions  - Left ventricle: The cavity size was normal. There was moderate   concentric hypertrophy. Systolic function was reduced. Diffuse   hypokinesis. Doppler parameters are  consistent with abnormal left   ventricular relaxation (grade 1 diastolic dysfunction). Doppler   parameters are consistent with high ventricular filling pressure. - Regional wall motion abnormality: Severe hypokinesis of the basal   inferolateral myocardium. - Pericardium, extracardiac: There was no pericardial effusion. - Impressions: Poor endocardial definition. Left ventricular   systolic function is reduced but I am unable to accurately   comment on LVEF (somewhere in the 30-40% range). I would   recommend a repeat limited study with contrast enhancement.  Impressions:  - Poor endocardial definition. Left ventricular systolic function   is reduced but I am unable to accurately comment on LVEF   (somewhere in the 30-40% range). I would recommend a repeat   limited study with contrast enhancement.  Assessment and Plan   1. Chronic systolic HF - despite her apparent weight gain by our scales, her home weights are overall stable and appears euvolemic by exam - continue current meds - repeat limited echo with contrast, if LVEF <35% will refer to EP to consider ICD  2. CAD - cath with multivessel disease, s/p stents to LAD and RCA. - no recent symptoms. Interventional cardiology had recommended indefinite plavix.    3. HTN - above goal, increase norvasc to 10mg  daily  4. 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 Ok to proceed from cardiac standpoint. May hold plavix starting 5 days prior, if need to hold ASA then hold starting 7 days prior to surgery.    F/u 2 months   Arnoldo Lenis, M.D

## 2018-11-29 NOTE — Patient Instructions (Signed)
Medication Instructions:  Your physician recommends that you continue on your current medications as directed. Please refer to the Current Medication list given to you today.   Labwork: none  Testing/Procedures: Your physician has requested that you have an echocardiogram. Echocardiography is a painless test that uses sound waves to create images of your heart. It provides your doctor with information about the size and shape of your heart and how well your heart's chambers and valves are working. This procedure takes approximately one hour. There are no restrictions for this procedure.    Follow-Up: Your physician recommends that you schedule a follow-up appointment in: 2 months    Any Other Special Instructions Will Be Listed Below (If Applicable).     If you need a refill on your cardiac medications before your next appointment, please call your pharmacy.

## 2018-12-05 ENCOUNTER — Inpatient Hospital Stay (HOSPITAL_COMMUNITY): Admission: RE | Admit: 2018-12-05 | Payer: Medicare HMO | Source: Ambulatory Visit

## 2018-12-10 ENCOUNTER — Encounter (HOSPITAL_COMMUNITY): Payer: Self-pay

## 2018-12-10 NOTE — Progress Notes (Addendum)
Anesthesia PAT Evaluation:  Case:  [id number] Date/Time:  12/18/18 0945   Procedure:  EXCISION TONGUE MASS (N/A )   Anesthesia type:  General   Pre-op diagnosis:  tongue mass   Location:  [facility] OR ROOM 09 / [place]   Surgeon:  Leta Baptist, MD      DISCUSSION: Patient is a 62 year old female scheduled for the above procedure. She noticed a small lesion on her tongue ~ 08/2018, but was not having any issue from it until 08/24/18 when she must have irritated it while chewing. She could not get it to stop bleeding, so she went to the [facility] ED. Bleeding stopped and she was advised to see ENT if lesion did not clear. It has since increased in size.  History includes former smoker (quit '07), HTN, CAD (not felt amendable to CABG, s/p DES LAD, DES RCA, medical therapy for LCX and RPDA 09/12/17, interventional cardiology recommended lifelong Plavix), chronic systolic CHF, left BBB, morbid obesity, DM2, dyspnea, thyroid goiter (s/p total thyroidectomy 03/19/02), post-surgical hypothyroidism, OSA (intolerant to CPAP).   She saw her cardiologist Dr. Harl Bowie on 11/29/18 for follow-up and preoperative evaluation. She had no recent symptoms. Although she had weight gain she appeared euvolemic by exam. He ordered an echo with two month follow-up to re-evaluate her EF and if < 35% would consider EP referral. In regarding to preoperative evaluation, he wrote,  "- plans for surgery to remove tongue mass. Ok to proceed from cardiac standpoint. May hold plavix starting 5 days prior, if need to hold ASA then hold starting 7 days prior to surgery." (Of note, she did not show for her 12/05/18 echo because of transportation issues. Dr. Nelly Laurence note does not specify timing, but appears to want it at least done by her 01/30/19 follow-up visit. She is planning to call and reschedule.)  She denied any chest pain or new/worsening SOB. Last Plavix dose 12/12/18. She was advised to clarify with Dr. Benjamine Mola about ASA although Dr. Harl Bowie gave permission to  hold as well if needed. Her tongue lesion in located at the tip of her tongue--roughly 1 x 2 cm. No bleeding noted. Mallampati III.   Preoperative labs show A1c 11.2 which is up from 10.4 on 07/17/18. She says she was transitioned to insulin ~ September, but isn't noticing any improvement with her home readings--mainly staying in the 200's but typically < 250 in the morning. She thinks her tongue lesion has increased in size since starting insulin. Amber at Dr. Sherrie Sport and Nira Conn at Dr. Deeann Saint office notified of patient's A1c. Patient is being seen by her PCP on 12/13/18 and will hopefully get adjustments in her DM regimen. We discussed that a significantly elevated BP, particularly if > 250 could result in case cancellation.  Reviewed with anesthesiologist Lillia Abed, MD. Timing of follow-up echo not specified, but already given clearance from Dr. Harl Bowie and patient without acute CV/HF symptoms so Dr. Conrad  did not feel echo would necessarily have to be done prior to surgery (known EF ~ 30-40% since 2018). Her DM is not well controlled and even worse since starting insulin. (I believe she may contribute the lesion's growth to her insulin, so I'm not sure if she is being completely compliant with her regimen. Fortunately, she is seeing her PCP on 12/13/18 who can make additional recommendations. Even at her current A1c, she says fasting CBGs are < 250. Her tongue lesion is on the tip of her tongue, so would not anticipate it interfering  with her airway, although she is a potentially difficult airway just based on her size, short neck, low palate. Anesthesiologist to evaluate on the day of surgery.      ADDENDUM 12/17/18 11:31 AM: Follow-up echo done 12/16/18 and LVEF grossly 35-40%. She also saw her PCP as scheduled on 12/13/18 and Humolog increased to 10 units (from 5) before meals and Tresiba to 25 Units (from 16) daily. She will get a fasting CBG on arrival.     VS: BP (!) 182/89   Pulse 67   Temp 36.8 C    Ht 5\' 5"  (1.651 m)   Wt (!) 147.1 kg   SpO2 97%   BMI 53.97 kg/m  Patient is a pleasant black female in NAD. No conversational dyspnea. She denied chest pain and SOB at rest. Reports she is able to clean her home without significant SOB. Denied edema. Reports she can lie flat for short periods of time without difficulty. Heart RRR, no murmur noted. Lungs clear. Airway/tongue lesion as above.   PROVIDERS: Neale Burly, MD is PCP - Carlyle Dolly, MD is cardiologist. Last visit 11/29/18.   - Ivin Poot, MD is CT surgeon. He saw patient on 09/10/17 for evaluation of severe 3V CAD in a diabetic pattern with significant distal stenoses not amenable to CABG. He recommended PCI of LAD versus medical therapy.   LABS: Preoperative labs noted. A1c as discussed above. (all labs ordered are listed, but only abnormal results are displayed)  Labs Reviewed  GLUCOSE, CAPILLARY - Abnormal; Notable for the following components:      Result Value   Glucose-Capillary 250 (*)    All other components within normal limits  BASIC METABOLIC PANEL - Abnormal; Notable for the following components:   Glucose, Bld 278 (*)    Calcium 8.7 (*)    All other components within normal limits  HEMOGLOBIN A1C - Abnormal; Notable for the following components:   Hgb A1c MFr Bld 11.2 (*)    All other components within normal limits  CBC     IMAGES: CXR 07/16/18: IMPRESSION: Cardiomegaly with borderline vascular congestion. No pulmonary edema.  CTA head/neck 03/14/18: IMPRESSION: No aneurysm, occlusion or stenosis of the arteries of the head and neck.   EKG: 07/16/18: NSR, left BBB.    CV: Echo 12/16/18: Study Conclusions - Limited study to evaluate LV function. - Technically difficult study, poor visualization. Technical issues   with attempts at echo contrast injections, inadequate contrast   effect on imaging despite multiple doses. - LVEF appears grossly in the 35-40% range.  Echo 09/02/18: Study  Conclusions - Left ventricle: The cavity size was normal. There was moderate   concentric hypertrophy. Systolic function was reduced. Diffuse   hypokinesis. Doppler parameters are consistent with abnormal left   ventricular relaxation (grade 1 diastolic dysfunction). Doppler   parameters are consistent with high ventricular filling pressure. - Regional wall motion abnormality: Severe hypokinesis of the basal   inferolateral myocardium. - Pericardium, extracardiac: There was no pericardial effusion. - Impressions: Poor endocardial definition. Left ventricular   systolic function is reduced but I am unable to accurately   comment on LVEF (somewhere in the 30-40% range). I would   recommend a repeat limited study with contrast enhancement. Impressions: - Poor endocardial definition. Left ventricular systolic function   is reduced but I am unable to accurately comment on LVEF   (somewhere in the 30-40% range). I would recommend a repeat   limited study with contrast enhancement. (Comparison:  LVEF 30-35% with diffuse inferior hypokinesis, moderate pericardial effusion without tamponade 09/18/17.)  Cardiac cath 09/10/17:  Prox RCA lesion, 30 %stenosed.  Prox RCA to Mid RCA lesion, 70 %stenosed.  Mid RCA lesion, 90 %stenosed.  RPDA lesion, 80 %stenosed.  Prox Cx lesion, 20 %stenosed.  Ost 1st Mrg lesion, 80 %stenosed.  1st Mrg-2 lesion, 80 %stenosed.  1st Mrg-1 lesion, 90 %stenosed.  Mid Cx-2 lesion, 50 %stenosed.  Mid Cx-1 lesion, 60 %stenosed.  Prox LAD lesion, 60 %stenosed.  Mid LAD lesion, 85 %stenosed. - Severe multivessel CAD with 60% proximal LAD stenosis and 85% mid LAD stenosis; 80, 90 and 80%  OM1 stenosis of the left circumflex with 60% mid AV groove and 50% mid distal AV groove stenosis; dominant RCA with 30% proximal followed by 70% proximal stenosis, 90% mid stenosis, and diffuse mid distal PDA stenosis of at least 80%. - LVEDP 33 mmHg;  EF by previous echo 30-35%;  left ventriculography not performed. - Moderate right heart pressure elevation with mild/moderate pulmonary hypertension. RECOMMENDATION: In this diabetic female with significant multivessel CAD, recommend surgical consultation for consideration for CABG revascularization. (CAD not felt amendable to CABG per CT surgery.) PCI 09/12/17:  - Prox LAD lesion, 60%. Prox LAD to Mid LAD lesion, 65%. Mid LAD lesion, 85%. Tandem lesion. S/p DES (Resolute Onyx). Post intervention 0% residual stenosis. - Prox RCA lesion, 45%. Prox RCA to Mid RCA lesion, 70%. Mid RCA lesion, 90%. Tandem lesions. S/p DES (Resolute Onyx) x2 in overlapping fashion. Post intervention 0% residual stenosis. - RPDA lesion, 80%. Plan medical management. IMPRESSION: Successful 2 vessel PCI with stable circumflex disease. Plan is to treat circumflex flex disease medically for now. PLAN: Continue to optimize medical management, but from a post Standpoint she will be able to be discharged tomorrow. Continue aspirin plus Plavix for minimum one year, but would continue Plavix lifelong.   Past Medical History:  Diagnosis Date  . 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 Coronary artery disease 09/2017   a. multivessel CAD by cath in 09/2017 and not felt to be a CABG candidate --> underwent two-vessel PCI with DES to the LAD and DES to the RCA  . Diabetes mellitus   . Dyspnea   . Glaucoma   . Hypertension   . Morbid obesity ([place])   . Obstructive sleep apnea    does not wear CPAP  . Thyroid disease     Past Surgical History:  Procedure Laterality Date  . ABDOMINAL HYSTERECTOMY    . CARDIAC CATHETERIZATION  09/12/2017  . CORONARY STENT INTERVENTION  09/12/2017   STENT RESOLUTE ONYX 1.44R15 drug eluting stent was successfully placed  . CORONARY STENT INTERVENTION N/A 09/12/2017   Procedure: CORONARY STENT INTERVENTION;  Surgeon: Leonie Man, MD;  Location: [facility];  Service: Cardiovascular;  Laterality: N/A;  . LEFT HEART  CATH AND CORONARY ANGIOGRAPHY N/A 09/12/2017   Procedure: LEFT HEART CATH AND CORONARY ANGIOGRAPHY;  Surgeon: Leonie Man, MD;  Location: [facility];  Service: Cardiovascular;  Laterality: N/A;  . RIGHT/LEFT HEART CATH AND CORONARY ANGIOGRAPHY N/A 09/10/2017   Procedure: RIGHT/LEFT HEART CATH AND CORONARY ANGIOGRAPHY;  Surgeon: Troy Sine, MD;  Location: [facility];  Service: Cardiovascular;  Laterality: N/A;  . THYROID SURGERY      MEDICATIONS: . amLODipine (NORVASC) 5 MG tablet  . aspirin EC 325 MG tablet  . atorvastatin (LIPITOR) 80 MG tablet  . carvedilol (COREG) 25 MG tablet  .  clopidogrel (PLAVIX) 75 MG tablet  . furosemide (LASIX) 40 MG tablet  . insulin aspart (NOVOLOG) 100 UNIT/ML injection  . insulin degludec (TRESIBA) 100 UNIT/ML SOPN FlexTouch Pen  . Insulin Pen Needle 32G X 4 MM MISC  . isosorbide mononitrate (IMDUR) 60 MG 24 hr tablet  . levothyroxine (SYNTHROID, LEVOTHROID) 125 MCG tablet  . ONETOUCH VERIO test strip  . sacubitril-valsartan (ENTRESTO) 97-103 MG  . spironolactone (ALDACTONE) 25 MG tablet  . VENTOLIN HFA 108 (90 Base) MCG/ACT inhaler   No current facility-administered medications for this encounter.     Myra Gianotti, PA-C Surgical Short Stay/Anesthesiology [facility] Phone [phone] [facility] Phone [phone] 12/12/2018 5:58 PM

## 2018-12-10 NOTE — Pre-Procedure Instructions (Signed)
Maureen Jackson  12/10/2018      [id number] - [place], [place] [address] Phone: [phone] Fax: [phone]  [facility] [place], [place] [address] Phone: [phone] Fax: [phone]    Your procedure is scheduled on 12/18/18.  Report to [facility] Admitting at 8 A.M.  Call this number if you have problems the morning of surgery:  [phone]   Remember:  Do not eat or drink after midnight.      Take these medicines the morning of surgery with A SIP OF WATER ---norvasc,carvedilol,imdur,synthroid,all inhales    Do not wear jewelry, make-up or nail polish.  Do not wear lotions, powders, or perfumes, or deodorant.  Do not shave 48 hours prior to surgery.  Men may shave face and neck.  Do not bring valuables to the hospital.  [facility] is not responsible for any belongings or valuables.  Contacts, dentures or bridgework may not be worn into surgery.  Leave your suitcase in the car.  After surgery it may be brought to your room.  For patients admitted to the hospital, discharge time will be determined by your treatment team.  Patients discharged the day of surgery will not be allowed to drive home.   Name and phone number of your driver:   Do not take any aspirin,anti-inflammatories,vitamins,or herbal supplements 5-7 days prior to surgery. Special instructions:  [facility] - Preparing for Surgery  Before surgery, you can play an important role.  Because skin is not sterile, your skin needs to be as free of germs as possible.  You can reduce the number of germs on you skin by washing with CHG (chlorahexidine gluconate) soap before surgery.  CHG is an antiseptic cleaner which kills germs and bonds with the skin to continue killing germs even after  washing.  Oral Hygiene is also important in reducing the risk of infection.  Remember to brush your teeth with your regular toothpaste the morning of surgery.  Please DO NOT use if you have an allergy to CHG or antibacterial soaps.  If your skin becomes reddened/irritated stop using the CHG and inform your nurse when you arrive at Short Stay.  Do not shave (including legs and underarms) for at least 48 hours prior to the first CHG shower.  You may shave your face.  Please follow these instructions carefully:   1.  Shower with CHG Soap the night before surgery and the morning of Surgery.  2.  If you choose to wash your hair, wash your hair first as usual with your normal shampoo.  3.  After you shampoo, rinse your hair and body thoroughly to remove the shampoo. 4.  Use CHG as you would any other liquid soap.  You can apply chg directly to the skin and wash gently with a      scrungie or washcloth.           5.  Apply the CHG Soap to your body ONLY FROM THE NECK DOWN.   Do not use on open wounds or open sores. Avoid contact with your eyes, ears, mouth and genitals (private parts).  Wash genitals (private parts) with your normal soap.  6.  Wash thoroughly, paying special attention to the area where  your surgery will be performed.  7.  Thoroughly rinse your body with warm water from the neck down.  8.  DO NOT shower/wash with your normal soap after using and rinsing off the CHG Soap.  9.  Pat yourself dry with a clean towel.            10.  Wear clean pajamas.            11.  Place clean sheets on your bed the night of your first shower and do not sleep with pets.  Day of Surgery  Do not apply any lotions/deoderants the morning of surgery.   Please wear clean clothes to the hospital/surgery center. Remember to brush your teeth with toothpaste.     How to Manage Your Diabetes Before and After Surgery  Why is it important to control my blood sugar before and after surgery? . Improving  blood sugar levels before and after surgery helps healing and can limit problems. . A way of improving blood sugar control is eating a healthy diet by: o  Eating less sugar and carbohydrates o  Increasing activity/exercise o  Talking with your doctor about reaching your blood sugar goals . High blood sugars (greater than 180 mg/dL) can raise your risk of infections and slow your recovery, so you will need to focus on controlling your diabetes during the weeks before surgery. . Make sure that the doctor who takes care of your diabetes knows about your planned surgery including the date and location.  How do I manage my blood sugar before surgery? . Check your blood sugar at least 4 times a day, starting 2 days before surgery, to make sure that the level is not too high or low. o Check your blood sugar the morning of your surgery when you wake up and every 2 hours until you get to the Short Stay unit. . If your blood sugar is less than 70 mg/dL, you will need to treat for low blood sugar: o Do not take insulin. o Treat a low blood sugar (less than 70 mg/dL) with  cup of clear juice (cranberry or apple), 4 glucose tablets, OR glucose gel. Recheck blood sugar in 15 minutes after treatment (to make sure it is greater than 70 mg/dL). If your blood sugar is not greater than 70 mg/dL on recheck, call [phone] o  for further instructions. . Report your blood sugar to the short stay nurse when you get to Short Stay.  . If you are admitted to the hospital after surgery: o Your blood sugar will be checked by the staff and you will probably be given insulin after surgery (instead of oral diabetes medicines) to make sure you have good blood sugar levels. o The goal for blood sugar control after surgery is 80-180 mg/dL.              WHAT DO I DO ABOUT MY DIABETES MEDICATION?   Marland Kitchen Do not take oral diabetes medicines (pills) the morning of surgery.  . THE NIGHT BEFORE SURGERY, take  ___________ units of ___________insulin.       . THE MORNING OF SURGERY, take _____________ units of __________insulin.  . The day of surgery, do not take other diabetes injectables, including Byetta (exenatide), Bydureon (exenatide ER), Victoza (liraglutide), or Trulicity (dulaglutide).  . If your CBG is greater than 220 mg/dL, you may take  of your sliding scale (correction) dose of insulin.  Other Instructions:  Patient Signature:  Date:   Nurse Signature:  Date:   Reviewed and Endorsed by [facility] Patient Education Committee, August 2015  Please read over the following fact sheets that you were given.

## 2018-12-12 ENCOUNTER — Encounter (HOSPITAL_COMMUNITY): Payer: Self-pay

## 2018-12-12 ENCOUNTER — Encounter (HOSPITAL_COMMUNITY)
Admission: RE | Admit: 2018-12-12 | Discharge: 2018-12-12 | Disposition: A | Discharge status: Home / Self Care | Payer: Medicare HMO | Source: Ambulatory Visit | Attending: Otolaryngology | Admitting: Otolaryngology

## 2018-12-12 DIAGNOSIS — Z87891 Personal history of nicotine dependence: Secondary | ICD-10-CM | POA: Diagnosis not present

## 2018-12-12 DIAGNOSIS — M1711 Unilateral primary osteoarthritis, right knee: Secondary | ICD-10-CM | POA: Diagnosis not present

## 2018-12-12 DIAGNOSIS — Z794 Long term (current) use of insulin: Secondary | ICD-10-CM | POA: Insufficient documentation

## 2018-12-12 DIAGNOSIS — Z7982 Long term (current) use of aspirin: Secondary | ICD-10-CM | POA: Insufficient documentation

## 2018-12-12 DIAGNOSIS — Z79890 Hormone replacement therapy (postmenopausal): Secondary | ICD-10-CM | POA: Insufficient documentation

## 2018-12-12 DIAGNOSIS — I5022 Chronic systolic (congestive) heart failure: Secondary | ICD-10-CM | POA: Insufficient documentation

## 2018-12-12 DIAGNOSIS — Z79899 Other long term (current) drug therapy: Secondary | ICD-10-CM | POA: Diagnosis not present

## 2018-12-12 DIAGNOSIS — Z955 Presence of coronary angioplasty implant and graft: Secondary | ICD-10-CM | POA: Diagnosis not present

## 2018-12-12 DIAGNOSIS — G4733 Obstructive sleep apnea (adult) (pediatric): Secondary | ICD-10-CM | POA: Insufficient documentation

## 2018-12-12 DIAGNOSIS — E079 Disorder of thyroid, unspecified: Secondary | ICD-10-CM | POA: Insufficient documentation

## 2018-12-12 DIAGNOSIS — I2510 Atherosclerotic heart disease of native coronary artery without angina pectoris: Secondary | ICD-10-CM | POA: Diagnosis not present

## 2018-12-12 DIAGNOSIS — Z6841 Body Mass Index (BMI) 40.0 and over, adult: Secondary | ICD-10-CM | POA: Insufficient documentation

## 2018-12-12 DIAGNOSIS — E119 Type 2 diabetes mellitus without complications: Secondary | ICD-10-CM | POA: Insufficient documentation

## 2018-12-12 DIAGNOSIS — I110 Hypertensive heart disease with heart failure: Secondary | ICD-10-CM | POA: Insufficient documentation

## 2018-12-12 DIAGNOSIS — Z7902 Long term (current) use of antithrombotics/antiplatelets: Secondary | ICD-10-CM | POA: Insufficient documentation

## 2018-12-12 DIAGNOSIS — Z90710 Acquired absence of both cervix and uterus: Secondary | ICD-10-CM | POA: Diagnosis not present

## 2018-12-12 DIAGNOSIS — Z01812 Encounter for preprocedural laboratory examination: Secondary | ICD-10-CM | POA: Diagnosis not present

## 2018-12-12 HISTORY — DX: Other lesions of oral mucosa: K13.79

## 2018-12-12 HISTORY — DX: Chronic systolic (congestive) heart failure: I50.22

## 2018-12-12 HISTORY — DX: Left bundle-branch block, unspecified: I44.7

## 2018-12-12 LAB — CBC
HCT: 38.5 % (ref 36.0–46.0)
HEMOGLOBIN: 12.1 g/dL (ref 12.0–15.0)
MCH: 26.5 pg (ref 26.0–34.0)
MCHC: 31.4 g/dL (ref 30.0–36.0)
MCV: 84.4 fL (ref 80.0–100.0)
NRBC: 0 % (ref 0.0–0.2)
PLATELETS: 238 10*3/uL (ref 150–400)
RBC: 4.56 MIL/uL (ref 3.87–5.11)
RDW: 13.3 % (ref 11.5–15.5)
WBC: 7.3 10*3/uL (ref 4.0–10.5)

## 2018-12-12 LAB — BASIC METABOLIC PANEL
Anion gap: 7 (ref 5–15)
BUN: 14 mg/dL (ref 6–20)
CHLORIDE: 103 mmol/L (ref 98–111)
CO2: 27 mmol/L (ref 22–32)
CREATININE: 0.9 mg/dL (ref 0.44–1.00)
Calcium: 8.7 mg/dL — ABNORMAL LOW (ref 8.9–10.3)
Glucose, Bld: 278 mg/dL — ABNORMAL HIGH (ref 70–99)
POTASSIUM: 4.3 mmol/L (ref 3.5–5.1)
SODIUM: 137 mmol/L (ref 135–145)

## 2018-12-12 LAB — HEMOGLOBIN A1C
HEMOGLOBIN A1C: 11.2 % — AB (ref 4.8–5.6)
MEAN PLASMA GLUCOSE: 274.74 mg/dL

## 2018-12-12 LAB — GLUCOSE, CAPILLARY: GLUCOSE-CAPILLARY: 250 mg/dL — AB (ref 70–99)

## 2018-12-12 NOTE — Anesthesia Preprocedure Evaluation (Addendum)
Anesthesia Evaluation  Patient identified by MRN, date of birth, ID band Patient awake    Reviewed: Allergy & Precautions, NPO status , Patient's Chart, lab work & pertinent test results  History of Anesthesia Complications Negative for: history of anesthetic complications  Airway Mallampati: III  TM Distance: >3 FB Neck ROM: Full    Dental  (+) Edentulous Upper, Poor Dentition, Dental Advisory Given   Pulmonary sleep apnea , former smoker,    Pulmonary exam normal        Cardiovascular hypertension, Pt. on home beta blockers and Pt. on medications + CAD, + Cardiac Stents and +CHF  Normal cardiovascular exam  Echo 12/16/18: Study Conclusions - Limited study to evaluate LV function. - Technically difficult study, poor visualization. Technical issues with attempts at echo contrast injections, inadequate contrast effect on imaging despite multiple doses. - LVEF appears grossly in the 35-40% range.    Neuro/Psych TIA   GI/Hepatic Neg liver ROS, GERD  ,  Endo/Other  diabetesHypothyroidism Morbid obesity  Renal/GU negative Renal ROS     Musculoskeletal negative musculoskeletal ROS (+)   Abdominal   Peds  Hematology negative hematology ROS (+)   Anesthesia Other Findings Day of surgery medications reviewed with the patient.  Reproductive/Obstetrics                           Anesthesia Physical Anesthesia Plan  ASA: III  Anesthesia Plan: General   Post-op Pain Management:    Induction: Intravenous  PONV Risk Score and Plan: 4 or greater and Ondansetron and Diphenhydramine  Airway Management Planned: LMA  Additional Equipment:   Intra-op Plan:   Post-operative Plan: Post-operative intubation/ventilation  Informed Consent: I have reviewed the patients History and Physical, chart, labs and discussed the procedure including the risks, benefits and alternatives for the proposed  anesthesia with the patient or authorized representative who has indicated his/her understanding and acceptance.   Dental advisory given  Plan Discussed with: CRNA, Anesthesiologist and Surgeon  Anesthesia Plan Comments: (See PAT note written 12/12/2018 by Myra Gianotti, PA-C. )      Anesthesia Quick Evaluation

## 2018-12-12 NOTE — Progress Notes (Signed)
Pt to hold plavix x5days per dr Harl Bowie.She did complain of bleeding of gums and teeth when she ate or brushed teeth. Insulin discussed with pt,she expressed understanding. Will f/u with Ebony Hail PA

## 2018-12-13 ENCOUNTER — Other Ambulatory Visit: Payer: Self-pay

## 2018-12-13 DIAGNOSIS — E038 Other specified hypothyroidism: Secondary | ICD-10-CM | POA: Diagnosis not present

## 2018-12-13 DIAGNOSIS — Z6841 Body Mass Index (BMI) 40.0 and over, adult: Secondary | ICD-10-CM | POA: Diagnosis not present

## 2018-12-13 DIAGNOSIS — I5022 Chronic systolic (congestive) heart failure: Secondary | ICD-10-CM

## 2018-12-13 DIAGNOSIS — H35033 Hypertensive retinopathy, bilateral: Secondary | ICD-10-CM | POA: Diagnosis not present

## 2018-12-13 DIAGNOSIS — I10 Essential (primary) hypertension: Secondary | ICD-10-CM | POA: Diagnosis not present

## 2018-12-13 DIAGNOSIS — E1165 Type 2 diabetes mellitus with hyperglycemia: Secondary | ICD-10-CM | POA: Diagnosis not present

## 2018-12-16 ENCOUNTER — Telehealth: Payer: Self-pay | Admitting: Cardiology

## 2018-12-16 ENCOUNTER — Ambulatory Visit (HOSPITAL_COMMUNITY)
Admission: RE | Admit: 2018-12-16 | Discharge: 2018-12-16 | Disposition: A | Discharge status: Home / Self Care | Payer: Medicare HMO | Source: Ambulatory Visit | Attending: Internal Medicine | Admitting: Internal Medicine

## 2018-12-16 DIAGNOSIS — I5022 Chronic systolic (congestive) heart failure: Secondary | ICD-10-CM | POA: Diagnosis not present

## 2018-12-16 MED ORDER — PERFLUTREN LIPID MICROSPHERE
1.0000 mL | INTRAVENOUS | Status: AC | PRN
  Administered 2018-12-16 (×2): 2 mL via INTRAVENOUS
  Filled 2018-12-16: qty 10

## 2018-12-16 NOTE — Progress Notes (Signed)
I.V. removed at 1650. Site was clean, dry and intact. 2 X 2 and Coflex used for dressing the site.  Alvino Chapel, RCS

## 2018-12-16 NOTE — Telephone Encounter (Signed)
IV # 22 angio left inner wrist started for Definity contrast in echo. Flushed with 10 ml NSS easily, anchored with tegaderm   Unsuccessful IV attempt right hand earlier  Alvino Chapel, echo tech to d/c IV after contrast

## 2018-12-16 NOTE — Progress Notes (Signed)
*  PRELIMINARY RESULTS* Echocardiogram Limited 2-D Echocardiogram has been performed and attempted with Definity. After a total of 4 ml of definity no adequate images could be obtained showing the contrast agent. I.V. checked and flushed with saline several different times without any obvious infiltration of the vein.   Maureen Jackson 12/16/2018, 5:07 PM

## 2018-12-18 ENCOUNTER — Encounter (HOSPITAL_COMMUNITY)
Admission: RE | Disposition: A | Discharge status: Home / Self Care | Payer: Self-pay | Source: Home / Self Care | Attending: Otolaryngology

## 2018-12-18 ENCOUNTER — Ambulatory Visit (HOSPITAL_COMMUNITY)
Admission: RE | Admit: 2018-12-18 | Discharge: 2018-12-18 | Disposition: A | Discharge status: Home / Self Care | Payer: Medicare HMO | Attending: Otolaryngology | Admitting: Otolaryngology

## 2018-12-18 ENCOUNTER — Other Ambulatory Visit: Payer: Self-pay

## 2018-12-18 ENCOUNTER — Ambulatory Visit (HOSPITAL_COMMUNITY): Payer: Medicare HMO | Admitting: Certified Registered Nurse Anesthetist

## 2018-12-18 ENCOUNTER — Ambulatory Visit (HOSPITAL_COMMUNITY): Payer: Medicare HMO | Admitting: Vascular Surgery

## 2018-12-18 ENCOUNTER — Encounter (HOSPITAL_COMMUNITY): Payer: Self-pay

## 2018-12-18 DIAGNOSIS — Z955 Presence of coronary angioplasty implant and graft: Secondary | ICD-10-CM | POA: Insufficient documentation

## 2018-12-18 DIAGNOSIS — Z79890 Hormone replacement therapy (postmenopausal): Secondary | ICD-10-CM | POA: Diagnosis not present

## 2018-12-18 DIAGNOSIS — K134 Granuloma and granuloma-like lesions of oral mucosa: Secondary | ICD-10-CM | POA: Insufficient documentation

## 2018-12-18 DIAGNOSIS — Z6841 Body Mass Index (BMI) 40.0 and over, adult: Secondary | ICD-10-CM | POA: Insufficient documentation

## 2018-12-18 DIAGNOSIS — Z7902 Long term (current) use of antithrombotics/antiplatelets: Secondary | ICD-10-CM | POA: Insufficient documentation

## 2018-12-18 DIAGNOSIS — K148 Other diseases of tongue: Secondary | ICD-10-CM | POA: Diagnosis not present

## 2018-12-18 DIAGNOSIS — K140 Glossitis: Secondary | ICD-10-CM | POA: Diagnosis not present

## 2018-12-18 DIAGNOSIS — Z79899 Other long term (current) drug therapy: Secondary | ICD-10-CM | POA: Diagnosis not present

## 2018-12-18 DIAGNOSIS — E079 Disorder of thyroid, unspecified: Secondary | ICD-10-CM | POA: Diagnosis not present

## 2018-12-18 DIAGNOSIS — E119 Type 2 diabetes mellitus without complications: Secondary | ICD-10-CM | POA: Insufficient documentation

## 2018-12-18 DIAGNOSIS — D3702 Neoplasm of uncertain behavior of tongue: Secondary | ICD-10-CM | POA: Diagnosis not present

## 2018-12-18 DIAGNOSIS — K149 Disease of tongue, unspecified: Secondary | ICD-10-CM | POA: Diagnosis present

## 2018-12-18 DIAGNOSIS — G4733 Obstructive sleep apnea (adult) (pediatric): Secondary | ICD-10-CM | POA: Diagnosis not present

## 2018-12-18 DIAGNOSIS — I110 Hypertensive heart disease with heart failure: Secondary | ICD-10-CM | POA: Insufficient documentation

## 2018-12-18 DIAGNOSIS — I2510 Atherosclerotic heart disease of native coronary artery without angina pectoris: Secondary | ICD-10-CM | POA: Insufficient documentation

## 2018-12-18 DIAGNOSIS — Z794 Long term (current) use of insulin: Secondary | ICD-10-CM | POA: Insufficient documentation

## 2018-12-18 DIAGNOSIS — I509 Heart failure, unspecified: Secondary | ICD-10-CM | POA: Insufficient documentation

## 2018-12-18 DIAGNOSIS — I5043 Acute on chronic combined systolic (congestive) and diastolic (congestive) heart failure: Secondary | ICD-10-CM | POA: Diagnosis not present

## 2018-12-18 HISTORY — PX: MASS EXCISION: SHX2000

## 2018-12-18 LAB — GLUCOSE, CAPILLARY
Glucose-Capillary: 290 mg/dL — ABNORMAL HIGH (ref 70–99)
Glucose-Capillary: 300 mg/dL — ABNORMAL HIGH (ref 70–99)

## 2018-12-18 SURGERY — EXCISION MASS
Anesthesia: General

## 2018-12-18 MED ORDER — FENTANYL CITRATE (PF) 100 MCG/2ML IJ SOLN
INTRAMUSCULAR | Status: AC
  Filled 2018-12-18: qty 2

## 2018-12-18 MED ORDER — MIDAZOLAM HCL 2 MG/2ML IJ SOLN
INTRAMUSCULAR | Status: AC
  Filled 2018-12-18: qty 2

## 2018-12-18 MED ORDER — FENTANYL CITRATE (PF) 100 MCG/2ML IJ SOLN
INTRAMUSCULAR | Status: DC | PRN
  Administered 2018-12-18: 100 ug via INTRAVENOUS

## 2018-12-18 MED ORDER — INSULIN ASPART 100 UNIT/ML ~~LOC~~ SOLN
5.0000 [IU] | Freq: Once | SUBCUTANEOUS | Status: AC
  Administered 2018-12-18: 5 [IU] via SUBCUTANEOUS

## 2018-12-18 MED ORDER — 0.9 % SODIUM CHLORIDE (POUR BTL) OPTIME
TOPICAL | Status: DC | PRN
  Administered 2018-12-18: 500 mL

## 2018-12-18 MED ORDER — FENTANYL CITRATE (PF) 100 MCG/2ML IJ SOLN
25.0000 ug | INTRAMUSCULAR | Status: DC | PRN
  Administered 2018-12-18: 50 ug via INTRAVENOUS

## 2018-12-18 MED ORDER — PROPOFOL 10 MG/ML IV BOLUS
INTRAVENOUS | Status: AC
  Filled 2018-12-18: qty 40

## 2018-12-18 MED ORDER — SODIUM CHLORIDE 0.9 % IV SOLN
INTRAVENOUS | Status: DC | PRN
  Administered 2018-12-18: 50 ug/min via INTRAVENOUS

## 2018-12-18 MED ORDER — LIDOCAINE 2% (20 MG/ML) 5 ML SYRINGE
INTRAMUSCULAR | Status: DC | PRN
  Administered 2018-12-18: 80 mg via INTRAVENOUS

## 2018-12-18 MED ORDER — LIDOCAINE 2% (20 MG/ML) 5 ML SYRINGE
INTRAMUSCULAR | Status: AC
  Filled 2018-12-18: qty 5

## 2018-12-18 MED ORDER — ROCURONIUM BROMIDE 50 MG/5ML IV SOSY
PREFILLED_SYRINGE | INTRAVENOUS | Status: AC
  Filled 2018-12-18: qty 5

## 2018-12-18 MED ORDER — INSULIN ASPART 100 UNIT/ML ~~LOC~~ SOLN
SUBCUTANEOUS | Status: AC
  Filled 2018-12-18: qty 1

## 2018-12-18 MED ORDER — LIDOCAINE-EPINEPHRINE 1 %-1:100000 IJ SOLN
INTRAMUSCULAR | Status: DC | PRN
  Administered 2018-12-18: 3 mL

## 2018-12-18 MED ORDER — FENTANYL CITRATE (PF) 250 MCG/5ML IJ SOLN
INTRAMUSCULAR | Status: AC
  Filled 2018-12-18: qty 5

## 2018-12-18 MED ORDER — PROPOFOL 10 MG/ML IV BOLUS
INTRAVENOUS | Status: DC | PRN
  Administered 2018-12-18: 350 mg via INTRAVENOUS

## 2018-12-18 MED ORDER — DEXAMETHASONE SODIUM PHOSPHATE 10 MG/ML IJ SOLN
INTRAMUSCULAR | Status: AC
  Filled 2018-12-18: qty 1

## 2018-12-18 MED ORDER — PROMETHAZINE HCL 25 MG/ML IJ SOLN: 6.2500 mg | INTRAMUSCULAR | Status: DC | PRN

## 2018-12-18 MED ORDER — MIDAZOLAM HCL 2 MG/2ML IJ SOLN
INTRAMUSCULAR | Status: DC | PRN
  Administered 2018-12-18: 1 mg via INTRAVENOUS

## 2018-12-18 MED ORDER — LACTATED RINGERS IV SOLN
INTRAVENOUS | Status: DC
  Administered 2018-12-18 (×2): via INTRAVENOUS

## 2018-12-18 MED ORDER — BACITRACIN ZINC 500 UNIT/GM EX OINT
TOPICAL_OINTMENT | CUTANEOUS | Status: AC
  Filled 2018-12-18: qty 28.35

## 2018-12-18 MED ORDER — ONDANSETRON HCL 4 MG/2ML IJ SOLN
INTRAMUSCULAR | Status: AC
  Filled 2018-12-18: qty 2

## 2018-12-18 MED ORDER — DEXTROSE 5 % IV SOLN
INTRAVENOUS | Status: DC | PRN
  Administered 2018-12-18: 3 g via INTRAVENOUS

## 2018-12-18 MED ORDER — OXYCODONE-ACETAMINOPHEN 5-325 MG PO TABS: 1.0000 | ORAL_TABLET | ORAL | 0 refills | Status: DC | PRN

## 2018-12-18 MED ORDER — ONDANSETRON HCL 4 MG/2ML IJ SOLN
INTRAMUSCULAR | Status: DC | PRN
  Administered 2018-12-18: 4 mg via INTRAVENOUS

## 2018-12-18 SURGICAL SUPPLY — 61 items
ADH SKN CLS APL DERMABOND .7 (GAUZE/BANDAGES/DRESSINGS)
AIRSTRIP 4 3/4X3 1/4 7185 (GAUZE/BANDAGES/DRESSINGS) IMPLANT
ATTRACTOMAT 16X20 MAGNETIC DRP (DRAPES) IMPLANT
BLADE 10 SAFETY STRL DISP (BLADE) ×1 IMPLANT
BLADE SURG 15 STRL LF DISP TIS (BLADE) IMPLANT
BLADE SURG 15 STRL SS (BLADE) ×2
BNDG CONFORM 2 STRL LF (GAUZE/BANDAGES/DRESSINGS) IMPLANT
BNDG GAUZE ELAST 4 BULKY (GAUZE/BANDAGES/DRESSINGS) IMPLANT
CANISTER SUCT 3000ML PPV (MISCELLANEOUS) ×1 IMPLANT
CATH ROBINSON RED A/P 16FR (CATHETERS) IMPLANT
CLEANER TIP ELECTROSURG 2X2 (MISCELLANEOUS) ×2 IMPLANT
CONT SPEC 4OZ CLIKSEAL STRL BL (MISCELLANEOUS) ×1 IMPLANT
COVER SURGICAL LIGHT HANDLE (MISCELLANEOUS) ×2 IMPLANT
COVER WAND RF STERILE (DRAPES) ×1 IMPLANT
CRADLE DONUT ADULT HEAD (MISCELLANEOUS) ×1 IMPLANT
DECANTER SPIKE VIAL GLASS SM (MISCELLANEOUS) ×1 IMPLANT
DERMABOND ADVANCED (GAUZE/BANDAGES/DRESSINGS)
DERMABOND ADVANCED .7 DNX12 (GAUZE/BANDAGES/DRESSINGS) ×1 IMPLANT
DRAIN PENROSE 1/4X12 LTX STRL (WOUND CARE) IMPLANT
DRAIN SNY 10 ROU (WOUND CARE) IMPLANT
DRAIN SNY 7 FPER (WOUND CARE) IMPLANT
DRAPE HALF SHEET 40X57 (DRAPES) IMPLANT
DRSG EMULSION OIL 3X3 NADH (GAUZE/BANDAGES/DRESSINGS) IMPLANT
ELECT COATED BLADE 2.86 ST (ELECTRODE) ×2 IMPLANT
ELECT NDL TIP 2.8 STRL (NEEDLE) IMPLANT
ELECT NEEDLE TIP 2.8 STRL (NEEDLE) IMPLANT
ELECT REM PT RETURN 9FT ADLT (ELECTROSURGICAL) ×2
ELECTRODE REM PT RTRN 9FT ADLT (ELECTROSURGICAL) ×1 IMPLANT
GAUZE 4X4 16PLY RFD (DISPOSABLE) IMPLANT
GAUZE SPONGE 4X4 12PLY STRL (GAUZE/BANDAGES/DRESSINGS) IMPLANT
GLOVE ECLIPSE 7.5 STRL STRAW (GLOVE) ×2 IMPLANT
GOWN STRL REUS W/ TWL LRG LVL3 (GOWN DISPOSABLE) ×2 IMPLANT
GOWN STRL REUS W/ TWL XL LVL3 (GOWN DISPOSABLE) ×1 IMPLANT
GOWN STRL REUS W/TWL LRG LVL3 (GOWN DISPOSABLE) ×2
GOWN STRL REUS W/TWL XL LVL3 (GOWN DISPOSABLE) ×2
KIT BASIN OR (CUSTOM PROCEDURE TRAY) ×2 IMPLANT
KIT TURNOVER KIT B (KITS) ×2 IMPLANT
NDL 25GX 5/8IN NON SAFETY (NEEDLE) IMPLANT
NDL HYPO 25GX1X1/2 BEV (NEEDLE) IMPLANT
NEEDLE 25GX 5/8IN NON SAFETY (NEEDLE) ×2 IMPLANT
NEEDLE HYPO 25GX1X1/2 BEV (NEEDLE) ×2 IMPLANT
NS IRRIG 1000ML POUR BTL (IV SOLUTION) ×2 IMPLANT
PAD ARMBOARD 7.5X6 YLW CONV (MISCELLANEOUS) ×3 IMPLANT
PENCIL FOOT CONTROL (ELECTRODE) ×1 IMPLANT
POUCH STERILIZING 3 X22 (STERILIZATION PRODUCTS) IMPLANT
SUT CHROMIC 4 0 P 3 18 (SUTURE) ×1 IMPLANT
SUT ETHILON 4 0 PS 2 18 (SUTURE) ×1 IMPLANT
SUT ETHILON 5 0 P 3 18 (SUTURE)
SUT NYLON ETHILON 5-0 P-3 1X18 (SUTURE) ×1 IMPLANT
SUT SILK 4 0 (SUTURE)
SUT SILK 4-0 18XBRD TIE 12 (SUTURE) ×1 IMPLANT
SUT VIC AB 4-0 SH 27 (SUTURE) ×2
SUT VIC AB 4-0 SH 27XBRD (SUTURE) IMPLANT
SWAB COLLECTION DEVICE MRSA (MISCELLANEOUS) IMPLANT
SWAB CULTURE ESWAB REG 1ML (MISCELLANEOUS) IMPLANT
SYR BULB IRRIGATION 50ML (SYRINGE) IMPLANT
SYR TB 1ML LUER SLIP (SYRINGE) IMPLANT
TOWEL OR 17X24 6PK STRL BLUE (TOWEL DISPOSABLE) ×2 IMPLANT
TRAY ENT MC OR (CUSTOM PROCEDURE TRAY) ×2 IMPLANT
WATER STERILE IRR 1000ML POUR (IV SOLUTION) ×2 IMPLANT
YANKAUER SUCT BULB TIP NO VENT (SUCTIONS) ×1 IMPLANT

## 2018-12-18 NOTE — Anesthesia Postprocedure Evaluation (Signed)
Anesthesia Post Note  Patient: Maureen Jackson  Procedure(s) Performed: EXCISION TONGUE MASS (N/A )     Patient location during evaluation: PACU Anesthesia Type: General Level of consciousness: sedated Pain management: pain level controlled Vital Signs Assessment: post-procedure vital signs reviewed and stable Respiratory status: spontaneous breathing and respiratory function stable Cardiovascular status: stable Postop Assessment: no apparent nausea or vomiting Anesthetic complications: no    Last Vitals:  Vitals:   12/18/18 1040 12/18/18 1054  BP: (!) 110/58 125/61  Pulse: 64 64  Resp: 17 20  Temp:  36.6 C  SpO2: 98% 96%    Last Pain:  Vitals:   12/18/18 1110  TempSrc:   PainSc: 5                  Cherine Drumgoole DANIEL

## 2018-12-18 NOTE — Anesthesia Procedure Notes (Signed)
Procedure Name: LMA Insertion Date/Time: 12/18/2018 10:00 AM Performed by: Leonor Liv, CRNA Pre-anesthesia Checklist: Patient identified, Emergency Drugs available, Suction available and Patient being monitored Patient Re-evaluated:Patient Re-evaluated prior to induction Oxygen Delivery Method: Circle System Utilized Preoxygenation: Pre-oxygenation with 100% oxygen Induction Type: IV induction Ventilation: Mask ventilation without difficulty LMA: LMA inserted LMA Size: 4.0 Number of attempts: 1 Airway Equipment and Method: Bite block Placement Confirmation: positive ETCO2 Tube secured with: Tape Dental Injury: Teeth and Oropharynx as per pre-operative assessment

## 2018-12-18 NOTE — Transfer of Care (Signed)
Immediate Anesthesia Transfer of Care Note  Patient: Maureen Jackson  Procedure(s) Performed: EXCISION TONGUE MASS (N/A )  Patient Location: PACU  Anesthesia Type:General  Level of Consciousness: awake, alert  and oriented  Airway & Oxygen Therapy: Patient Spontanous Breathing and Patient connected to nasal cannula oxygen  Post-op Assessment: Report given to RN, Post -op Vital signs reviewed and stable and Patient moving all extremities  Post vital signs: Reviewed and stable  Last Vitals:  Vitals Value Taken Time  BP 110/66 12/18/2018 10:25 AM  Temp 36.4 C 12/18/2018 10:25 AM  Pulse 65 12/18/2018 10:31 AM  Resp 19 12/18/2018 10:31 AM  SpO2 97 % 12/18/2018 10:31 AM  Vitals shown include unvalidated device data.  Last Pain:  Vitals:   12/18/18 1025  PainSc: 0-No pain         Complications: No apparent anesthesia complications

## 2018-12-18 NOTE — Discharge Instructions (Addendum)
Patient may resume all her previous activities and diet. She may resume her Plavix tomorrow. She will follow-up in my office in one week.   Tongue Biopsy, Care After Refer to this sheet in the next few weeks. These instructions provide you with information about caring for yourself after your procedure. Your health care provider may also give you more specific instructions. Your treatment has been planned according to current medical practices, but problems sometimes occur. Call your health care provider if you have any problems or questions after your procedure. What can I expect after the procedure? After the procedure, it is common to have:  Temporary tongue numbness.  Pain and soreness.  Bloody spit.  Swelling. Follow these instructions at home: Mouth Care  Follow instructions from your health care provider about how to take care of your biopsy site.  Rinse your mouth with a cool salt and soda water mixture 3-4 times per day, or as many times as needed. To make a salt and soda water mixture, completely dissolve -1 tsp of salt and 1 tsp of baking soda in 1 cup of warm water. If this causes discomfort, rinse your mouth with water only and keep your mouth clean.  Check your biopsy site every day for signs of infection. Check for: ? More redness, swelling, or pain. ? More fluid or blood. ? Pus or a bad smell. Driving  Do not drive or use heavy machinery while taking prescription pain medicine.  Do not drive for 24 hours if you received a medicine to help you relax (sedative) during your procedure. Activity  Return to your normal activities as told by your health care provider. Ask your health care provider what activities are safe for you.  Raise (elevate) your head above the level of your heart while you are sitting or lying down. General instructions   Do not chew food until your mouth stops being numb. You may need to avoid hot and spicy foods or foods that are difficult to  chew and swallow. Avoid these foods until your health care provider says that you can return to your normal diet.  Follow instructions from your health care provider about other eating or drinking restrictions.  Do not use any tobacco products, such as cigarettes, chewing tobacco, and e-cigarettes. If you need help quitting, ask your health care provider.  Take over-the-counter and prescription medicines only as told by your health care provider.  Keep all follow-up visits as told by your health care provider. This is important. Contact a health care provider if:  You have a fever or chills.  You have more redness, swelling, or pain around your biopsy site.  You have more fluid or blood coming from your biopsy site.  You have pus or a bad smell coming from your biopsy site.  You have pain that does not get better with medicine.  You have tongue pain or swelling that causes you to have trouble eating or drinking. Get help right away if:  You have new bleeding that does not stop after you apply pressure with a gauze pad.  You are unable to swallow.  You have difficulty breathing. This information is not intended to replace advice given to you by your health care provider. Make sure you discuss any questions you have with your health care provider. Document Released: 12/12/2015 Document Revised: 07/31/2016 Document Reviewed: 12/12/2015 Elsevier Interactive Patient Education  2019 [facility] Instructions  Activity: Get plenty of rest  for the remainder of the day. A responsible individual must stay with you for 24 hours following the procedure.  For the next 24 hours, DO NOT: -Drive a car -Paediatric nurse -Drink alcoholic beverages -Take any medication unless instructed by your physician -Make any legal decisions or sign important papers.  Meals: Start with liquid foods such as gelatin or soup. Progress to regular foods as tolerated. Avoid  greasy, spicy, heavy foods. If nausea and/or vomiting occur, drink only clear liquids until the nausea and/or vomiting subsides. Call your physician if vomiting continues.  Special Instructions/Symptoms: Your throat may feel dry or sore from the anesthesia or the breathing tube placed in your throat during surgery. If this causes discomfort, gargle with warm salt water. The discomfort should disappear within 24 hours.  If you had a scopolamine patch placed behind your ear for the management of post- operative nausea and/or vomiting:  1. The medication in the patch is effective for 72 hours, after which it should be removed.  Wrap patch in a tissue and discard in the trash. Wash hands thoroughly with soap and water. 2. You may remove the patch earlier than 72 hours if you experience unpleasant side effects which may include dry mouth, dizziness or visual disturbances. 3. Avoid touching the patch. Wash your hands with soap and water after contact with the patch.

## 2018-12-18 NOTE — H&P (Signed)
Cc: Tongue mass  HPI: The patient is a 63 year old female who presents today for evaluation of her tongue mass and recurrent bleeding.  According to the patient, she has been symptomatic for the past 2 months.  Her symptoms started with intermittent bleeding from her inferior tongue.  She was seen at the [facility].  At that time, she was noted to have a superficial cut on her anterior tongue.  It has since grown into a soft tissue mass.  The patient continues to have 1 to 2 episodes of bleeding from her tongue.  She is currently anticoagulated with Plavix due to her congestive heart failure.  She also has a history of insulin dependent diabetes.  She has no previous history of tobacco use.  She has no dysphagia or odynophagia.  She has no previous ENT surgery.    The patient's review of systems (constitutional, eyes, ENT, cardiovascular, respiratory, GI, musculoskeletal, skin, neurologic, psychiatric, endocrine, hematologic, allergic) is noted in the ROS questionnaire.  It is reviewed with the patient.  Family health history: Diabetes, heart disease, cancer.  Major events: Hysterectomy, thyroid surgery, coronary angioplasty.  Ongoing medical problems: OSA, thyroid disease, hypertension, diabetes.  Social history: The patient is a widow. She denies the use of alcohol or illegal drugs. She is a former smoker.    Exam General: Communicates without difficulty, well nourished, no acute distress. Head: Normocephalic, no evidence injury, no tenderness, facial buttresses intact without stepoff. Face/sinus: No tenderness to palpation and percussion. Facial movement is normal and symmetric. Eyes: PERRL, EOMI. No scleral icterus, conjunctivae clear. Neuro: CN II exam reveals vision grossly intact.  No nystagmus at any point of gaze. Ears: Auricles well formed without lesions.  Ear canals are intact without mass or lesion.  No erythema or edema is appreciated.  The TMs are intact without fluid. Nose: External  evaluation reveals normal support and skin without lesions.  Dorsum is intact.  Anterior rhinoscopy reveals healthy pink mucosa over anterior aspect of inferior turbinates and intact septum.  No purulence noted. Oral:  Oral cavity and oropharynx are intact, symmetric, without erythema or edema.  The patient is noted to have a 1 cm soft tissue mass at the anterior tip of her tongue.  Neck: Full range of motion without pain.  There is no significant lymphadenopathy.  No masses palpable.  Thyroid bed within normal limits to palpation.  Parotid glands and submandibular glands equal bilaterally without mass.  Trachea is midline. Neuro:  CN 2-12 grossly intact. Gait normal.   Assessment 1.  The patient is noted to have a 1 cm soft tissue mass at the anterior tip of her tongue. The appearance is suggestive of a pyogenic granuloma.  The patient has been experiencing intermittent bleeding from the lesion.   Plan 1.  The physical exam findings are reviewed with the patient.   2.  Based on the above findings, the patient will likely need to undergo surgical excision of the mass.  The risks, benefits, and details of the procedure are reviewed with the patient.  3.  The patient will need preoperative cardiac clearance.  The patient will also need to stop her Plavix prior to her surgery.

## 2018-12-18 NOTE — Op Note (Signed)
DATE OF PROCEDURE:  12/18/2018                              OPERATIVE REPORT  SURGEON:  Leta Baptist, MD  PREOPERATIVE DIAGNOSES: 1. Anterior tongue mass  POSTOPERATIVE DIAGNOSES: 1. Anterior tongue mass  PROCEDURE PERFORMED:  Excision of anterior tongue mass with closure  ANESTHESIA:  General endotracheal tube anesthesia.  COMPLICATIONS:  None.  ESTIMATED BLOOD LOSS:  Minimal.  INDICATION FOR PROCEDURE:  Maureen Jackson is a 63 y.o. female with a history of an enlarging anterior tongue mass.  The mass started after she had a cut on her tongue. She has noted intermittent bleeding from the mass. Based on the above findings, the decision was made for the patient to undergo the above-stated procedure. Likelihood of success in reducing symptoms was also discussed.  The risks, benefits, alternatives, and details of the procedure were discussed with the patient.  Questions were invited and answered.  Informed consent was obtained.  DESCRIPTION:  The patient was taken to the operating room and placed supine on the operating table.  General laryngeal mask anesthesia was administered by the anesthesiologist.  The patient was positioned and prepped and draped in a standard fashion for oral surgery.  The patient's anterior tongue mass was visualized. A 2 cm soft tissue mass was noted on the tip of her tongue. 1% lidocaine with 1-100,000 epinephrine was infiltrated at the planned site of incision. An elliptical incision was made around the base of the mass. The entire mass was removed and sent to the pathology department for permanent histologic identification. Hemostasis was achieved with a Bovie electrocautery device. The incision was closed in layers with interrupted 4-0 Vicryl sutures.  The care of the patient was turned over to the anesthesiologist.  The patient was awakened from anesthesia without difficulty.  The patient was extubated and transferred to the recovery room in good condition.  OPERATIVE  FINDINGS:  An anterior tongue mass.  SPECIMEN:  Tongue mass.  FOLLOWUP CARE:  The patient will be discharged home once awake and alert.  The patient will follow up in my office in approximately 1 week.  [name] W [name] 12/18/2018 10:15 AM

## 2018-12-19 ENCOUNTER — Encounter (HOSPITAL_COMMUNITY): Payer: Self-pay | Admitting: Otolaryngology

## 2018-12-20 ENCOUNTER — Telehealth: Payer: Self-pay

## 2018-12-20 NOTE — Telephone Encounter (Signed)
-----   Message from Arnoldo Lenis, MD sent at 12/20/2018  3:00 PM EST ----- Heart function overall stable, just above the range to require a defibrillator at this time   J BrancH MD

## 2018-12-20 NOTE — Telephone Encounter (Signed)
Called pt. No answer. Left message for pt to return call.  

## 2018-12-20 NOTE — Telephone Encounter (Signed)
Called pt. No answer, left message for pt to return call.  

## 2018-12-30 ENCOUNTER — Ambulatory Visit (INDEPENDENT_AMBULATORY_CARE_PROVIDER_SITE_OTHER): Payer: Managed Care, Other (non HMO) | Admitting: Otolaryngology

## 2019-01-30 ENCOUNTER — Ambulatory Visit: Payer: Medicare HMO | Admitting: Cardiology

## 2019-02-22 DIAGNOSIS — R69 Illness, unspecified: Secondary | ICD-10-CM | POA: Diagnosis not present

## 2019-03-10 ENCOUNTER — Ambulatory Visit: Payer: Medicare HMO | Admitting: Cardiology

## 2019-04-23 DIAGNOSIS — I10 Essential (primary) hypertension: Secondary | ICD-10-CM | POA: Diagnosis not present

## 2019-04-23 DIAGNOSIS — Z6841 Body Mass Index (BMI) 40.0 and over, adult: Secondary | ICD-10-CM | POA: Diagnosis not present

## 2019-04-23 DIAGNOSIS — I5022 Chronic systolic (congestive) heart failure: Secondary | ICD-10-CM | POA: Diagnosis not present

## 2019-04-23 DIAGNOSIS — Z1389 Encounter for screening for other disorder: Secondary | ICD-10-CM | POA: Diagnosis not present

## 2019-04-23 DIAGNOSIS — Z0000 Encounter for general adult medical examination without abnormal findings: Secondary | ICD-10-CM | POA: Diagnosis not present

## 2019-04-23 DIAGNOSIS — E038 Other specified hypothyroidism: Secondary | ICD-10-CM | POA: Diagnosis not present

## 2019-04-23 DIAGNOSIS — E1165 Type 2 diabetes mellitus with hyperglycemia: Secondary | ICD-10-CM | POA: Diagnosis not present

## 2019-05-16 ENCOUNTER — Ambulatory Visit: Payer: Medicare HMO | Admitting: Cardiology

## 2019-05-16 DIAGNOSIS — R69 Illness, unspecified: Secondary | ICD-10-CM | POA: Diagnosis not present

## 2019-05-16 DIAGNOSIS — Z0100 Encounter for examination of eyes and vision without abnormal findings: Secondary | ICD-10-CM | POA: Diagnosis not present

## 2019-05-22 ENCOUNTER — Other Ambulatory Visit: Payer: Self-pay

## 2019-05-22 ENCOUNTER — Emergency Department (HOSPITAL_COMMUNITY)
Admission: EM | Admit: 2019-05-22 | Discharge: 2019-05-22 | Disposition: A | Discharge status: Home / Self Care | Payer: Medicare HMO | Attending: Emergency Medicine | Admitting: Emergency Medicine

## 2019-05-22 ENCOUNTER — Encounter (HOSPITAL_COMMUNITY): Payer: Self-pay | Admitting: Emergency Medicine

## 2019-05-22 DIAGNOSIS — G8929 Other chronic pain: Secondary | ICD-10-CM | POA: Diagnosis not present

## 2019-05-22 DIAGNOSIS — Z7982 Long term (current) use of aspirin: Secondary | ICD-10-CM | POA: Diagnosis not present

## 2019-05-22 DIAGNOSIS — I509 Heart failure, unspecified: Secondary | ICD-10-CM | POA: Insufficient documentation

## 2019-05-22 DIAGNOSIS — Z87891 Personal history of nicotine dependence: Secondary | ICD-10-CM | POA: Insufficient documentation

## 2019-05-22 DIAGNOSIS — M5442 Lumbago with sciatica, left side: Secondary | ICD-10-CM | POA: Insufficient documentation

## 2019-05-22 DIAGNOSIS — E785 Hyperlipidemia, unspecified: Secondary | ICD-10-CM | POA: Diagnosis not present

## 2019-05-22 DIAGNOSIS — E119 Type 2 diabetes mellitus without complications: Secondary | ICD-10-CM | POA: Diagnosis not present

## 2019-05-22 DIAGNOSIS — I110 Hypertensive heart disease with heart failure: Secondary | ICD-10-CM | POA: Insufficient documentation

## 2019-05-22 DIAGNOSIS — I2510 Atherosclerotic heart disease of native coronary artery without angina pectoris: Secondary | ICD-10-CM | POA: Insufficient documentation

## 2019-05-22 DIAGNOSIS — Z79899 Other long term (current) drug therapy: Secondary | ICD-10-CM | POA: Insufficient documentation

## 2019-05-22 DIAGNOSIS — M545 Low back pain: Secondary | ICD-10-CM | POA: Diagnosis present

## 2019-05-22 DIAGNOSIS — R609 Edema, unspecified: Secondary | ICD-10-CM | POA: Diagnosis not present

## 2019-05-22 LAB — URINALYSIS, ROUTINE W REFLEX MICROSCOPIC
Bacteria, UA: NONE SEEN
Bilirubin Urine: NEGATIVE
Glucose, UA: 150 mg/dL — AB
Hgb urine dipstick: NEGATIVE
Ketones, ur: NEGATIVE mg/dL
Leukocytes,Ua: NEGATIVE
Nitrite: NEGATIVE
Protein, ur: 30 mg/dL — AB
Specific Gravity, Urine: 1.029 (ref 1.005–1.030)
pH: 5 (ref 5.0–8.0)

## 2019-05-22 MED ORDER — OXYCODONE-ACETAMINOPHEN 5-325 MG PO TABS: 1.0000 | ORAL_TABLET | ORAL | 0 refills | Status: DC | PRN

## 2019-05-22 MED ORDER — OXYCODONE-ACETAMINOPHEN 5-325 MG PO TABS: 2.0000 | ORAL_TABLET | ORAL | 0 refills | Status: DC | PRN

## 2019-05-22 MED ORDER — METHOCARBAMOL 500 MG PO TABS: 500.0000 mg | ORAL_TABLET | Freq: Three times a day (TID) | ORAL | 0 refills | Status: DC

## 2019-05-22 MED ORDER — CYCLOBENZAPRINE HCL 10 MG PO TABS
10.0000 mg | ORAL_TABLET | Freq: Once | ORAL | Status: AC
  Administered 2019-05-22: 10 mg via ORAL
  Filled 2019-05-22: qty 1

## 2019-05-22 NOTE — ED Provider Notes (Signed)
[facility] EMERGENCY DEPARTMENT Provider Note   CSN: [id number] Arrival date & time: 05/22/19  1854     History   Chief Complaint Chief Complaint  Patient presents with  . Back Pain    HPI Maureen Jackson is a 63 y.o. female.     HPI   Maureen Jackson is a 63 y.o. female with hx of peripheral edema, CHF, DM, who presents to the Emergency Department complaining of left lower back pain and swelling of both lower legs and feet.  Symptoms began 4 days ago.  She describes an aching pain to her left lower back  That radiates into her buttocks and hip area.  Pain worse with movement and improves at rest.  She denies known injury, abdominal pain, fever, urine or bowel changes, pain or numbness of the lower extremities.  She also states that she has noticed an increase in her baseline edema of her feet and lower legs.  She takes lasix, but admits that she has not been taking it daily stating that she doesn't like taking it when she has activities outside her home, because of the increased urination it causes.  She denies chest pain, cough, fever, and shortness of breath   Past Medical History:  Diagnosis Date  . 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 Bleeding from mouth    when she brushed teeth or ate  . Chronic systolic CHF (congestive heart failure) ([place])   . Coronary artery disease 09/2017   a. multivessel CAD by cath in 09/2017 and not felt to be a CABG candidate --> underwent two-vessel PCI with DES to the LAD and DES to the RCA  . Diabetes mellitus   . Dyspnea   . Glaucoma   . Hypertension   . Left bundle branch block   . Morbid obesity ([place])   . Obstructive sleep apnea    does not wear CPAP  . Thyroid disease     Patient Active Problem List   Diagnosis Date Noted  . Chest pain 07/16/2018  . Hyperlipidemia 04/10/2018  . TIA (transient ischemic attack) 03/14/2018  . Vertigo 03/14/2018  . Chronic combined systolic and diastolic CHF (congestive heart failure) ([place]) 03/14/2018   . Ischemic cardiomyopathy 11/06/2017  . CAD, multiple vessel   . Pericardial effusion 09/07/2017  . AKI (acute kidney injury) ([place]) 09/07/2017  . Elevated troponin I level 09/07/2017  . Acute combined systolic (congestive) and diastolic (congestive) heart failure ([place]) 09/06/2017  . Cardiomegaly 09/05/2017  . Morbid obesity ([place]) 03/04/2013  . Labyrinthitis 03/04/2013  . Diabetes mellitus type 2 with complications, uncontrolled ([place]) 03/04/2013  . GASTRITIS 12/13/2006  . HIATAL HERNIA, HX OF 12/13/2006  . THYROIDECTOMY, HX OF 12/13/2006  . Hypothyroidism 10/04/2006  . OBESITY 10/04/2006  . TOBACCO ABUSE 10/04/2006  . CARPAL TUNNEL SYNDROME 10/04/2006  . Essential hypertension 10/04/2006  . GERD 10/04/2006  . POSTMENOPAUSAL STATUS 10/04/2006  . SKIN TAG 10/04/2006  . KNEE PAIN, LEFT 10/04/2006  . Sleep apnea 10/04/2006  . LEG EDEMA 10/04/2006    Past Surgical History:  Procedure Laterality Date  . ABDOMINAL HYSTERECTOMY    . CARDIAC CATHETERIZATION  09/12/2017  . CORONARY STENT INTERVENTION  09/12/2017   STENT RESOLUTE ONYX 7.20N47 drug eluting stent was successfully placed  . CORONARY STENT INTERVENTION N/A 09/12/2017   Procedure: CORONARY STENT INTERVENTION;  Surgeon: Leonie Man, MD;  Location: [facility];  Service: Cardiovascular;  Laterality: N/A;  . LEFT HEART CATH AND  CORONARY ANGIOGRAPHY N/A 09/12/2017   Procedure: LEFT HEART CATH AND CORONARY ANGIOGRAPHY;  Surgeon: Leonie Man, MD;  Location: [facility];  Service: Cardiovascular;  Laterality: N/A;  . MASS EXCISION N/A 12/18/2018   Procedure: EXCISION TONGUE MASS;  Surgeon: Leta Baptist, MD;  Location: [facility];  Service: ENT;  Laterality: N/A;  . RIGHT/LEFT HEART CATH AND CORONARY ANGIOGRAPHY N/A 09/10/2017   Procedure: RIGHT/LEFT HEART CATH AND CORONARY ANGIOGRAPHY;  Surgeon: Troy Sine, MD;  Location: [place];  Service: Cardiovascular;  Laterality: N/A;  . THYROID SURGERY       OB  History   No obstetric history on file.      Home Medications    Prior to Admission medications   Medication Sig Start Date End Date Taking? Authorizing Provider  amLODipine (NORVASC) 5 MG tablet TAKE 1 TABLET BY MOUTH EVERY DAY Patient taking differently: Take 5 mg by mouth daily.  11/12/18  Yes Strader, Fransisco Hertz, PA-C  aspirin EC 325 MG tablet Take 1 tablet (325 mg total) by mouth daily. 03/15/18  Yes Barton Dubois, MD  atorvastatin (LIPITOR) 80 MG tablet Take 1 tablet (80 mg total) by mouth daily at 6 PM. 08/20/18  Yes Strader, [place] M, PA-C  carvedilol (COREG) 25 MG tablet Take 1 tablet (25 mg total) by mouth 2 (two) times daily. 11/21/18 05/22/19 Yes BranchAlphonse Guild, MD  cetirizine (ZYRTEC) 10 MG tablet Take 10 mg by mouth as needed. 09/12/18  Yes [provider]  clopidogrel (PLAVIX) 75 MG tablet Take 1 tablet (75 mg total) by mouth daily with breakfast. 08/20/18  Yes Strader, [place] M, PA-C  furosemide (LASIX) 40 MG tablet Take 1 tablet (40 mg total) by mouth daily. 09/14/17  Yes Ghimire, Henreitta Leber, MD  insulin aspart (NOVOLOG) 100 UNIT/ML injection Inject 20 Units into the skin 3 (three) times daily before meals.    Yes [provider]  insulin degludec (TRESIBA) 100 UNIT/ML SOPN FlexTouch Pen Inject 0.16 mLs (16 Units total) into the skin daily. Please include needles as well for injection Patient taking differently: Inject 50 Units into the skin daily. Please include needles as well for injection 07/18/18  Yes Vann, Jessica U, DO  Insulin Pen Needle 32G X 4 MM MISC For use with lantus solostar-- change substitute if needed 07/18/18  Yes Vann, Jessica U, DO  isosorbide mononitrate (IMDUR) 60 MG 24 hr tablet Take 1 tablet (60 mg total) by mouth daily. 08/20/18  Yes Strader, [place] M, PA-C  JARDIANCE 10 MG TABS tablet Take 1 tablet by mouth daily. 05/01/19  Yes [provider]  levothyroxine (SYNTHROID, LEVOTHROID) 125 MCG tablet Take 125 mcg by mouth daily  before breakfast.  08/16/18  Yes [provider]  ONETOUCH VERIO test strip USE TO TEST BLOOD SUGAR BID 01/05/18  Yes [provider]  sacubitril-valsartan (ENTRESTO) 97-103 MG Take 1 tablet by mouth 2 (two) times daily. 08/20/18  Yes Strader, Fransisco Hertz, PA-C  spironolactone (ALDACTONE) 25 MG tablet Take 1 tablet (25 mg total) by mouth daily. 09/10/18 05/22/19 Yes Strader, Fransisco Hertz, PA-C  timolol (TIMOPTIC) 0.5 % ophthalmic solution Place 1 drop into both eyes 2 (two) times daily. 10/07/18  Yes [provider]  VENTOLIN HFA 108 (90 Base) MCG/ACT inhaler Inhale 1-2 puffs into the lungs every 6 (six) hours as needed for wheezing or shortness of breath. Patient taking differently: Inhale 1-2 puffs into the lungs as needed for wheezing or shortness of breath.  09/14/17  Yes Ghimire, Henreitta Leber, MD  oxyCODONE-acetaminophen (PERCOCET) 5-325 MG tablet Take 1 tablet by mouth every 4 (four) hours as needed for severe pain. Patient not taking: Reported on 05/22/2019 12/18/18   Leta Baptist, MD    Family History Family History  Problem Relation Age of Onset  . Diabetes Mother   . Hypertension Mother   . Cancer Mother        pancreas  . Hypertension Sister     Social History Social History   Tobacco Use  . Smoking status: Former Smoker    Quit date: 12/14/2005    Years since quitting: 13.4  . Smokeless tobacco: Never Used  . Tobacco comment: QUIT IN 2005  Substance Use Topics  . Alcohol use: No  . Drug use: No     Allergies   Aspirin   Review of Systems Review of Systems  Constitutional: Negative for fever.  Respiratory: Negative for chest tightness and shortness of breath.   Cardiovascular: Positive for leg swelling. Negative for chest pain.  Gastrointestinal: Negative for abdominal pain, constipation and vomiting.  Genitourinary: Negative for decreased urine volume, difficulty urinating, dysuria, flank pain and hematuria.  Musculoskeletal: Positive for back pain.  Negative for joint swelling.  Skin: Negative for rash.  Neurological: Negative for weakness and numbness.     Physical Exam Updated Vital Signs BP 136/74 (BP Location: Right Arm)   Pulse 73   Temp 98.3 F (36.8 C) (Oral)   Resp 20   Ht 5\' 5"  (1.651 m)   Wt (!) 152 kg   SpO2 94%   BMI 55.75 kg/m   Physical Exam Vitals signs and nursing note reviewed.  Constitutional:      General: She is not in acute distress.    Appearance: Normal appearance. She is well-developed. She is not ill-appearing.  HENT:     Head: Normocephalic.  Neck:     Musculoskeletal: Normal range of motion and neck supple.  Cardiovascular:     Rate and Rhythm: Normal rate and regular rhythm.     Pulses: Normal pulses.     Comments: DP pulses are strong and palpable bilaterally Pulmonary:     Effort: Pulmonary effort is normal. No respiratory distress.     Breath sounds: Normal breath sounds.  Chest:     Chest wall: No tenderness.  Abdominal:     General: There is no distension.     Palpations: Abdomen is soft.     Tenderness: There is no abdominal tenderness.  Musculoskeletal:        General: Tenderness present.     Lumbar back: She exhibits tenderness and pain. She exhibits normal range of motion, no swelling, no deformity, no laceration and normal pulse.     Right lower leg: Edema present.     Left lower leg: Edema present.     Comments: ttp of the left lower lumbar paraspinal muscles extending to left SI joint.  No spinal tenderness.  Pt has 5/5 strength against resistance of bilateral lower extremities.  1+ pitting edema bilaterally.  No excessive warmth or erythema.  Compartments are soft, no calf tenderness   Skin:    General: Skin is warm.     Capillary Refill: Capillary refill takes less than 2 seconds.     Findings: No erythema or rash.  Neurological:     General: No focal deficit present.     Mental Status: She is alert.     Sensory: Sensation is intact. No sensory deficit.  Motor:  Motor function is intact. No weakness or abnormal muscle tone.     Coordination: Coordination is intact. Coordination normal.     Gait: Gait normal.     Deep Tendon Reflexes:     Reflex Scores:      Patellar reflexes are 2+ on the right side and 2+ on the left side.      Achilles reflexes are 2+ on the right side and 2+ on the left side.    Comments: CN II-XII grossly intact      ED Treatments / Results  Labs (all labs ordered are listed, but only abnormal results are displayed) Labs Reviewed  URINE CULTURE - Abnormal; Notable for the following components:      Result Value   Culture   (*)    Value: <10,000 COLONIES/mL INSIGNIFICANT GROWTH Performed at [facility], [address], [place], [place] [address]    All other components within normal limits  URINALYSIS, ROUTINE W REFLEX MICROSCOPIC - Abnormal; Notable for the following components:   APPearance HAZY (*)    Glucose, UA 150 (*)    Protein, ur 30 (*)    All other components within normal limits    EKG    Radiology No results found.  Procedures Procedures (including critical care time)  Medications Ordered in ED Medications  cyclobenzaprine (FLEXERIL) tablet 10 mg (10 mg Oral Given 05/22/19 2024)     Initial Impression / Assessment and Plan / ED Course  I have reviewed the triage vital signs and the nursing notes.  Pertinent labs & imaging results that were available during my care of the patient were reviewed by me and considered in my medical decision making (see chart for details).        2145 Multiple unsuccessful  attempts by nursing to get blood work.  Pt now refusing any additional attempts.   Pt has peripheral edema at baseline, non compliance with her Lasix.  Lungs are clear.  No concerning sx's for CHF excerbation, vitals reassuring, no tachypnea or hypoxia.  Pt denies chest pain or dyspnea.  I have aggressively counseled pt about importance of taking her diuretic.  Back pain is w/o  midline tenderness and felt to be musculoskeletal.  I feel she is appropriate for d/c home, and she verbalized understanding of care instructions and taking her medication.  Strict return precautions given.   Final Clinical Impressions(s) / ED Diagnoses   Final diagnoses:  Chronic left-sided low back pain with left-sided sciatica  Peripheral edema    ED Discharge Orders    None       Kem Parkinson, PA-C 05/25/19 1326    Milton Ferguson, MD 05/26/19 2058

## 2019-05-22 NOTE — Discharge Instructions (Signed)
Its important to take your fluid medications every day to help reduce the swelling in your legs and feet.  Elevate your legs when possible will also help.  Continue taking your potassium daily.  Follow-up with your primary doctor on Monday for recheck

## 2019-05-22 NOTE — ED Triage Notes (Signed)
Pt c/o lower back pain when walking and swelling in lower extremities despite taking her "fluid pill". States all this started Sunday.

## 2019-05-22 NOTE — ED Notes (Signed)
Pt refused being in stuck in the hand for blood collection. This RN informed pt that she was going to have to get an [place] IV by another RN and the pt stated that she didn't want to be stuck. PA notified

## 2019-05-23 MED FILL — Oxycodone w/ Acetaminophen Tab 5-325 MG: ORAL | Qty: 6 | Status: AC

## 2019-05-24 LAB — URINE CULTURE: Culture: <10000 — AB

## 2019-06-06 ENCOUNTER — Ambulatory Visit: Payer: Medicare HMO | Admitting: Cardiology

## 2019-06-10 ENCOUNTER — Encounter (INDEPENDENT_AMBULATORY_CARE_PROVIDER_SITE_OTHER): Payer: Self-pay

## 2019-06-10 ENCOUNTER — Other Ambulatory Visit: Payer: Self-pay

## 2019-06-10 ENCOUNTER — Ambulatory Visit (INDEPENDENT_AMBULATORY_CARE_PROVIDER_SITE_OTHER): Payer: Medicare HMO | Admitting: Cardiology

## 2019-06-10 ENCOUNTER — Encounter: Payer: Self-pay | Admitting: Cardiology

## 2019-06-10 VITALS — BP 116/64 | HR 70 | Ht 65.0 in | Wt 343.2 lb

## 2019-06-10 DIAGNOSIS — I10 Essential (primary) hypertension: Secondary | ICD-10-CM

## 2019-06-10 DIAGNOSIS — I5022 Chronic systolic (congestive) heart failure: Secondary | ICD-10-CM

## 2019-06-10 DIAGNOSIS — I2510 Atherosclerotic heart disease of native coronary artery without angina pectoris: Secondary | ICD-10-CM

## 2019-06-10 NOTE — Patient Instructions (Signed)
Your physician recommends that you schedule a follow-up appointment in: 3 MONTHS WITH DR BRANCH  Your physician recommends that you continue on your current medications as directed. Please refer to the Current Medication list given to you today.  Thank you for choosing [facility]!!    

## 2019-06-10 NOTE — Progress Notes (Signed)
Clinical Summary Maureen Jackson is a 63 y.o.female seen today for follow up of the following medical problems.     1. Chronic systolic HF/ICM/CAD - patient admitted with acute onset CHF 08/2017.  - echo 08/2017 LVEF 30-35%, grade I diastoilc dysfunction - 09/2017 cath as reported below, found to have severe multivessel disease of LAD, LCX, and RCA. Mean PA 33, PCWP 25, CI 2.7 - seen by CT surgery, recs were for PCI as opposed to CABG. -  Received DES to LAD and DES x 2 to RCA, LCX disease managed medically. Recs for lifelong plavix per interventional cards. Has been on high dose ASA per neurology.  -discharge weight 09/13/17 312 lbs  08/2018 echo limited visually, LVEF 30-40% Jan 2020 echo: LVEF 35-40%, though difficult study even with contrast  - weight up from 326 in 11/2018 to 343 lbs.  - she reports increased appetite. No recent SOB/DOE, no orthopnea - compliant with meds      Past Medical History:  Diagnosis Date  . 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 Bleeding from mouth    when she brushed teeth or ate  . Chronic systolic CHF (congestive heart failure) ([place])   . Coronary artery disease 09/2017   a. multivessel CAD by cath in 09/2017 and not felt to be a CABG candidate --> underwent two-vessel PCI with DES to the LAD and DES to the RCA  . Diabetes mellitus   . Dyspnea   . Glaucoma   . Hypertension   . Left bundle [place] block   . Morbid obesity ([place])   . Obstructive sleep apnea    does not wear CPAP  . Thyroid disease      Allergies  Allergen Reactions  . Aspirin Nausea Only     Current Outpatient Medications  Medication Sig Dispense Refill  . amLODipine (NORVASC) 5 MG tablet TAKE 1 TABLET BY MOUTH EVERY DAY (Patient taking differently: Take 5 mg by mouth daily. ) 90 tablet 3  . aspirin EC 325 MG tablet Take 1 tablet (325 mg total) by mouth daily. 30 tablet 1  . atorvastatin (LIPITOR) 80 MG tablet Take 1 tablet (80 mg total) by mouth daily at 6 PM. 90  tablet 3  . carvedilol (COREG) 25 MG tablet Take 1 tablet (25 mg total) by mouth 2 (two) times daily. 180 tablet 3  . cetirizine (ZYRTEC) 10 MG tablet Take 10 mg by mouth as needed.  0  . clopidogrel (PLAVIX) 75 MG tablet Take 1 tablet (75 mg total) by mouth daily with breakfast. 90 tablet 3  . furosemide (LASIX) 40 MG tablet Take 1 tablet (40 mg total) by mouth daily. 30 tablet 0  . insulin aspart (NOVOLOG) 100 UNIT/ML injection Inject 20 Units into the skin 3 (three) times daily before meals.     . insulin degludec (TRESIBA) 100 UNIT/ML SOPN FlexTouch Pen Inject 0.16 mLs (16 Units total) into the skin daily. Please include needles as well for injection (Patient taking differently: Inject 50 Units into the skin daily. Please include needles as well for injection) 2 pen 0  . Insulin Pen Needle 32G X 4 MM MISC For use with lantus solostar-- change substitute if needed 100 each 0  . isosorbide mononitrate (IMDUR) 60 MG 24 hr tablet Take 1 tablet (60 mg total) by mouth daily. 90 tablet 3  . JARDIANCE 10 MG TABS tablet Take 1 tablet by mouth daily.    Marland Kitchen levothyroxine (SYNTHROID, LEVOTHROID)  125 MCG tablet Take 125 mcg by mouth daily before breakfast.   0  . methocarbamol (ROBAXIN) 500 MG tablet Take 1 tablet (500 mg total) by mouth 3 (three) times daily. 21 tablet 0  . ONETOUCH VERIO test strip USE TO TEST BLOOD SUGAR BID  2  . oxyCODONE-acetaminophen (PERCOCET/ROXICET) 5-325 MG tablet Take 2 tablets by mouth every 4 (four) hours as needed for severe pain. 6 tablet 0  . oxyCODONE-acetaminophen (PERCOCET/ROXICET) 5-325 MG tablet Take 1 tablet by mouth every 4 (four) hours as needed. 8 tablet 0  . sacubitril-valsartan (ENTRESTO) 97-103 MG Take 1 tablet by mouth 2 (two) times daily. 180 tablet 3  . spironolactone (ALDACTONE) 25 MG tablet Take 1 tablet (25 mg total) by mouth daily. 90 tablet 3  . timolol (TIMOPTIC) 0.5 % ophthalmic solution Place 1 drop into both eyes 2 (two) times daily.  5  . VENTOLIN  HFA 108 (90 Base) MCG/ACT inhaler Inhale 1-2 puffs into the lungs every 6 (six) hours as needed for wheezing or shortness of breath. (Patient taking differently: Inhale 1-2 puffs into the lungs as needed for wheezing or shortness of breath. ) 1 Inhaler 0   No current facility-administered medications for this visit.      Past Surgical History:  Procedure Laterality Date  . ABDOMINAL HYSTERECTOMY    . CARDIAC CATHETERIZATION  09/12/2017  . CORONARY STENT INTERVENTION  09/12/2017   STENT RESOLUTE ONYX 0.63K16 drug eluting stent was successfully placed  . CORONARY STENT INTERVENTION N/A 09/12/2017   Procedure: CORONARY STENT INTERVENTION;  Surgeon: Leonie Man, MD;  Location: [facility];  Service: Cardiovascular;  Laterality: N/A;  . LEFT HEART CATH AND CORONARY ANGIOGRAPHY N/A 09/12/2017   Procedure: LEFT HEART CATH AND CORONARY ANGIOGRAPHY;  Surgeon: Leonie Man, MD;  Location: [facility];  Service: Cardiovascular;  Laterality: N/A;  . MASS EXCISION N/A 12/18/2018   Procedure: EXCISION TONGUE MASS;  Surgeon: Leta Baptist, MD;  Location: [facility];  Service: ENT;  Laterality: N/A;  . RIGHT/LEFT HEART CATH AND CORONARY ANGIOGRAPHY N/A 09/10/2017   Procedure: RIGHT/LEFT HEART CATH AND CORONARY ANGIOGRAPHY;  Surgeon: Troy Sine, MD;  Location: [facility];  Service: Cardiovascular;  Laterality: N/A;  . THYROID SURGERY       Allergies  Allergen Reactions  . Aspirin Nausea Only      Family History  Problem Relation Age of Onset  . Diabetes Mother   . Hypertension Mother   . Cancer Mother        pancreas  . Hypertension Sister      Social History Maureen Jackson reports that she quit smoking about 13 years ago. She has never used smokeless tobacco. Maureen Jackson reports no history of alcohol use.   Review of Systems CONSTITUTIONAL: No weight loss, fever, chills, weakness or fatigue.  HEENT: Eyes: No visual loss, blurred vision, double vision or yellow  sclerae.No hearing loss, sneezing, congestion, runny nose or sore throat.  SKIN: No rash or itching.  CARDIOVASCULAR: per hpi RESPIRATORY: No shortness of breath, cough or sputum.  GASTROINTESTINAL: No anorexia, nausea, vomiting or diarrhea. No abdominal pain or blood.  GENITOURINARY: No burning on urination, no polyuria NEUROLOGICAL: No headache, dizziness, syncope, paralysis, ataxia, numbness or tingling in the extremities. No change in bowel or bladder control.  MUSCULOSKELETAL: No muscle, back pain, joint pain or stiffness.  LYMPHATICS: No enlarged nodes. No history of splenectomy.  PSYCHIATRIC: No history of depression or anxiety.  ENDOCRINOLOGIC:  No reports of sweating, cold or heat intolerance. No polyuria or polydipsia.  Marland Kitchen   Physical Examination   Today's Vitals   06/10/19 1247  BP: 116/64  Pulse: 70  SpO2: 97%  Weight: (!) 343 lb 3.2 oz (155.7 kg)  Height: 5\' 5"  (1.651 m)   Body mass index is 57.11 kg/m.  Gen: resting comfortably, no acute distress HEENT: no scleral icterus, pupils equal round and reactive, no palptable cervical adenopathy,  CV: RRR, no m/r/g, no jvd Resp: Clear to auscultation bilaterally GI: abdomen is soft, non-tender, non-distended, normal bowel sounds, no hepatosplenomegaly MSK: extremities are warm, no edema.  Skin: warm, no rash Neuro:  no focal deficits Psych: appropriate affect   Diagnostic Studies  10/2018diagnosticcath  Prox RCA lesion, 30 %stenosed.  Prox RCA to Mid RCA lesion, 70 %stenosed.  Mid RCA lesion, 90 %stenosed.  RPDA lesion, 80 %stenosed.  Prox Cx lesion, 20 %stenosed.  Ost 1st Mrg lesion, 80 %stenosed.  1st Mrg-2 lesion, 80 %stenosed.  1st Mrg-1 lesion, 90 %stenosed.  Mid Cx-2 lesion, 50 %stenosed.  Mid Cx-1 lesion, 60 %stenosed.  Prox LAD lesion, 60 %stenosed.  Mid LAD lesion, 85 %stenosed.  Severe multivessel CAD with 60% proximal LAD stenosis and 85% mid LAD stenosis; 80, 90 and 80% OM1  stenosis of the left circumflex with 60% mid AV groove and 50% mid distal AV groove stenosis; dominant RCA with 30% proximal followed by 70% proximal stenosis, 90% mid stenosis, and diffuse mid distal PDA stenosis of at least 80%.  LVEDP 33 mmHg; EF by previous echo 30-35%; left ventriculography not performed.  Moderate right heart pressure elevation with mild/moderate pulmonary hypertension.  RECOMMENDATION: In this diabetic female with significant multivessel CAD, recommend surgical consultation for consideration for CABG revascularization.  09/2017 PCI  LESION #1  Prox LAD lesion, 60 %stenosed. Prox LAD to Mid LAD lesion, 65 %stenosed. Mid LAD lesion, 85 %stenosed. Tandem Lesions.  A STENT RESOLUTE ONYX [id number] drug eluting stent was successfully placed. Post-dilated in tapered fashion: 3.6 - 2.9 mm  Post intervention, there is a 0% residual stenosis.  LESION $2  Prox RCA lesion, 45 %stenosed. Prox RCA to Mid RCA lesion, 70 %stenosed. Mid RCA lesion, 90 %stenosed. Tandem Lesions  A STENT RESOLUTE ONYX [id number] drug eluting stent and STENT RESOLUTE ONYX [id number] drug-eluting stent were successfully placed in overlapping fashion.  Post intervention, there is a 0% residual stenosis. Post-dilated in tapered fashion: 3.6 - 3.1 mm  RPDA lesion, 80 %stenosed. Plan Medical management.  Successful 2 vessel PCI with stable circumflex disease. Plan is to treat circumflex flex disease medically for now.   Plan: Return to nursing unit for post PCI care TR band removal. Continue to optimize medical management, but from a post Standpoint she will be able to be discharged tomorrow. Continue aspirin plus Plavix for minimum one year, but would continue Plavix lifelong     08/2017 echo Study Conclusions  - Procedure narrative: Transthoracic echocardiography. Image quality was fair. The study was technically difficult, as a result of poor sound wave transmission. Intravenous  contrast (Definity) was administered. - Left ventricle: The cavity size was mildly dilated. Systolic function was moderately to severely reduced. The estimated ejection fraction was in the range of 30% to 35%. Diffuse hypokinesis. Doppler parameters are consistent with abnormal left ventricular relaxation (grade 1 diastolic dysfunction). Mild concentric and moderate focal basal septal hypertrophy. - Ventricular septum: Septal motion showed abnormal function and dyssynergy. These changes are consistent with a left bundle Dresean Beckel  block. - Aortic valve: Mildly calcified annulus. - Systemic veins: IVC is dilated with normal respiratory variation. It was suboptimally visualized. Estimated right atrial pressure: 8 mmHg. - Pericardium, extracardiac: Moderate size pericardial effusion. There is some right atrial inversion noted, but no frank tamponade physiology.   08/2018 echo Study Conclusions  - Left ventricle: The cavity size was normal. There was moderate concentric hypertrophy. Systolic function was reduced. Diffuse hypokinesis. Doppler parameters are consistent with abnormal left ventricular relaxation (grade 1 diastolic dysfunction). Doppler parameters are consistent with high ventricular filling pressure. - Regional wall motion abnormality: Severe hypokinesis of the basal inferolateral myocardium. - Pericardium, extracardiac: There was no pericardial effusion. - Impressions: Poor endocardial definition. Left ventricular systolic function is reduced but I am unable to accurately comment on LVEF (somewhere in the 30-40% range). I would recommend a repeat limited study with contrast enhancement.  Impressions:  - Poor endocardial definition. Left ventricular systolic function is reduced but I am unable to accurately comment on LVEF (somewhere in the 30-40% range). I would recommend a repeat limited study with contrast enhancement.    Jan 2020 echo Study Conclusions  - Limited study to evaluate LV function. - Technically difficult study, poor visualization. Technical issues   with attempts at echo contrast injections, inadequate contrast   effect on imaging despite multiple doses. - LVEF appears grossly in the 35-40% range.  Assessment and Plan   1. Chronic systolic HF - significant weight gain since last visit however no new symptoms and no signs of fluid overload by exam. She reports poor dietary habitats, at this time it appears weight gain is from increased caloric intake - continue to monitor. Asked if any CHF symptoms of swelling to let us know and we would titrate her diuretic - on ideal doses of CHF regimen. LVEF above cut off for ICD by last check. If ever required or if clinical deterioration would be a candidate for CRT given her LBBB  2. CAD - cath with multivessel disease, s/p stents to LAD and RCA. -  Interventional cardiology had recommended indefinite plavix.  - no symptoms continue current meds  EKG today shows SR, LBBB  3. HTN -she is at goal, continue current meds   F/u 3 months        Arnoldo Lenis, M.D.

## 2019-06-11 ENCOUNTER — Encounter: Payer: Self-pay | Admitting: *Deleted

## 2019-06-26 DIAGNOSIS — R69 Illness, unspecified: Secondary | ICD-10-CM | POA: Diagnosis not present

## 2019-07-10 ENCOUNTER — Other Ambulatory Visit: Payer: Self-pay | Admitting: Cardiology

## 2019-07-16 ENCOUNTER — Other Ambulatory Visit: Payer: Self-pay | Admitting: Cardiology

## 2019-07-16 MED ORDER — ATORVASTATIN CALCIUM 80 MG PO TABS: 80.0000 mg | ORAL_TABLET | Freq: Every evening | ORAL | 3 refills | Status: DC

## 2019-07-16 NOTE — Telephone Encounter (Signed)
Pt would like to know why her atorvastatin (LIPITOR) 80 MG tablet [[id number]] was only written for 30 days instead of 90 day supply, she was JB on 6/30 in [place] and has a f/u w/ him on 10/07/2019

## 2019-07-16 NOTE — Telephone Encounter (Signed)
Sent in 90 day supply

## 2019-08-04 DIAGNOSIS — R69 Illness, unspecified: Secondary | ICD-10-CM | POA: Diagnosis not present

## 2019-08-27 NOTE — Telephone Encounter (Signed)
Error

## 2019-09-02 ENCOUNTER — Other Ambulatory Visit: Payer: Self-pay | Admitting: Student

## 2019-09-06 ENCOUNTER — Other Ambulatory Visit: Payer: Self-pay

## 2019-09-06 ENCOUNTER — Encounter (HOSPITAL_COMMUNITY): Payer: Self-pay

## 2019-09-06 ENCOUNTER — Emergency Department (HOSPITAL_COMMUNITY)
Admission: EM | Admit: 2019-09-06 | Discharge: 2019-09-06 | Disposition: A | Discharge status: Home / Self Care | Payer: Medicare HMO | Source: Home / Self Care | Attending: Emergency Medicine | Admitting: Emergency Medicine

## 2019-09-06 DIAGNOSIS — R509 Fever, unspecified: Secondary | ICD-10-CM | POA: Diagnosis not present

## 2019-09-06 DIAGNOSIS — Z79899 Other long term (current) drug therapy: Secondary | ICD-10-CM | POA: Insufficient documentation

## 2019-09-06 DIAGNOSIS — I5022 Chronic systolic (congestive) heart failure: Secondary | ICD-10-CM | POA: Insufficient documentation

## 2019-09-06 DIAGNOSIS — I2510 Atherosclerotic heart disease of native coronary artery without angina pectoris: Secondary | ICD-10-CM | POA: Insufficient documentation

## 2019-09-06 DIAGNOSIS — Z87891 Personal history of nicotine dependence: Secondary | ICD-10-CM | POA: Insufficient documentation

## 2019-09-06 DIAGNOSIS — L0291 Cutaneous abscess, unspecified: Secondary | ICD-10-CM | POA: Diagnosis not present

## 2019-09-06 DIAGNOSIS — L02416 Cutaneous abscess of left lower limb: Secondary | ICD-10-CM | POA: Insufficient documentation

## 2019-09-06 DIAGNOSIS — A419 Sepsis, unspecified organism: Secondary | ICD-10-CM | POA: Diagnosis not present

## 2019-09-06 DIAGNOSIS — Z794 Long term (current) use of insulin: Secondary | ICD-10-CM | POA: Insufficient documentation

## 2019-09-06 DIAGNOSIS — I110 Hypertensive heart disease with heart failure: Secondary | ICD-10-CM | POA: Insufficient documentation

## 2019-09-06 DIAGNOSIS — Z7982 Long term (current) use of aspirin: Secondary | ICD-10-CM | POA: Insufficient documentation

## 2019-09-06 DIAGNOSIS — R110 Nausea: Secondary | ICD-10-CM | POA: Diagnosis not present

## 2019-09-06 DIAGNOSIS — N764 Abscess of vulva: Secondary | ICD-10-CM | POA: Diagnosis not present

## 2019-09-06 DIAGNOSIS — Z7902 Long term (current) use of antithrombotics/antiplatelets: Secondary | ICD-10-CM | POA: Insufficient documentation

## 2019-09-06 DIAGNOSIS — E119 Type 2 diabetes mellitus without complications: Secondary | ICD-10-CM | POA: Insufficient documentation

## 2019-09-06 DIAGNOSIS — Z20828 Contact with and (suspected) exposure to other viral communicable diseases: Secondary | ICD-10-CM | POA: Diagnosis not present

## 2019-09-06 DIAGNOSIS — J189 Pneumonia, unspecified organism: Secondary | ICD-10-CM | POA: Diagnosis not present

## 2019-09-06 LAB — CBG MONITORING, ED: Glucose-Capillary: 250 mg/dL — ABNORMAL HIGH (ref 70–99)

## 2019-09-06 MED ORDER — ONDANSETRON 4 MG PO TBDP: 4.0000 mg | ORAL_TABLET | Freq: Three times a day (TID) | ORAL | 1 refills | Status: DC | PRN

## 2019-09-06 MED ORDER — HYDROCODONE-ACETAMINOPHEN 5-325 MG PO TABS
1.0000 | ORAL_TABLET | Freq: Once | ORAL | Status: AC
  Administered 2019-09-06: 09:00:00 1 via ORAL
  Filled 2019-09-06: qty 1

## 2019-09-06 MED ORDER — HYDROCODONE-ACETAMINOPHEN 5-325 MG PO TABS: 1.0000 | ORAL_TABLET | Freq: Four times a day (QID) | ORAL | 0 refills | Status: DC | PRN

## 2019-09-06 MED ORDER — DOXYCYCLINE HYCLATE 100 MG PO CAPS: 100.0000 mg | ORAL_CAPSULE | Freq: Two times a day (BID) | ORAL | 0 refills | Status: DC

## 2019-09-06 MED ORDER — DOXYCYCLINE HYCLATE 100 MG PO TABS
100.0000 mg | ORAL_TABLET | Freq: Once | ORAL | Status: AC
  Administered 2019-09-06: 100 mg via ORAL
  Filled 2019-09-06: qty 1

## 2019-09-06 MED ORDER — ONDANSETRON 4 MG PO TBDP
4.0000 mg | ORAL_TABLET | Freq: Once | ORAL | Status: AC
  Administered 2019-09-06: 4 mg via ORAL
  Filled 2019-09-06: qty 1

## 2019-09-06 NOTE — Discharge Instructions (Addendum)
Take antibiotics as directed.  Take the pain medication as directed.  Soak your bottom area in warm water for 20 minutes once a day.  If he can do it twice a day that would be better.  Return for any new or worse symptoms.  As we discussed this abscess area that is now draining may get worse.  Or perhaps the antibiotics will clear it up.  But difficult to predict at this point in time.  Nothing to drain abscess wise today.

## 2019-09-06 NOTE — ED Triage Notes (Signed)
Pt reports abscess to left side of labia x 2 days.  Reports nausea, no vomiting.

## 2019-09-06 NOTE — ED Provider Notes (Signed)
[facility] ([facility]) EMERGENCY DEPARTMENT Provider Note   CSN: [id number] Arrival date & time: 09/06/19  5916     History   Chief Complaint Chief Complaint  Patient presents with  . Abscess    HPI Maureen Jackson is a 63 y.o. female.     Patient with a complaint of a draining abscess in the left labial upper thigh area.  Patient states that the abscess started 2 days ago.  Is been draining for 2 days.  Reports some nausea no vomiting no fevers.  Patient states it is very painful.  Past medical history significant for hypertension diabetes morbid obesity chronic to systolic congestive heart failure.     Past Medical History:  Diagnosis Date  . 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 Bleeding from mouth    when she brushed teeth or ate  . Chronic systolic CHF (congestive heart failure) ([place])   . Coronary artery disease 09/2017   a. multivessel CAD by cath in 09/2017 and not felt to be a CABG candidate --> underwent two-vessel PCI with DES to the LAD and DES to the RCA  . Diabetes mellitus   . Dyspnea   . Glaucoma   . Hypertension   . Left bundle branch block   . Morbid obesity ([place])   . Obstructive sleep apnea    does not wear CPAP  . Thyroid disease     Patient Active Problem List   Diagnosis Date Noted  . Chest pain 07/16/2018  . Hyperlipidemia 04/10/2018  . TIA (transient ischemic attack) 03/14/2018  . Vertigo 03/14/2018  . Chronic combined systolic and diastolic CHF (congestive heart failure) ([place]) 03/14/2018  . Ischemic cardiomyopathy 11/06/2017  . CAD, multiple vessel   . Pericardial effusion 09/07/2017  . AKI (acute kidney injury) ([place]) 09/07/2017  . Elevated troponin I level 09/07/2017  . Acute combined systolic (congestive) and diastolic (congestive) heart failure ([place]) 09/06/2017  . Cardiomegaly 09/05/2017  . Morbid obesity ([place]) 03/04/2013  . Labyrinthitis 03/04/2013  . Diabetes mellitus type 2 with complications, uncontrolled ([place]) 03/04/2013  . GASTRITIS  12/13/2006  . HIATAL HERNIA, HX OF 12/13/2006  . THYROIDECTOMY, HX OF 12/13/2006  . Hypothyroidism 10/04/2006  . OBESITY 10/04/2006  . TOBACCO ABUSE 10/04/2006  . CARPAL TUNNEL SYNDROME 10/04/2006  . Essential hypertension 10/04/2006  . GERD 10/04/2006  . POSTMENOPAUSAL STATUS 10/04/2006  . SKIN TAG 10/04/2006  . KNEE PAIN, LEFT 10/04/2006  . Sleep apnea 10/04/2006  . LEG EDEMA 10/04/2006    Past Surgical History:  Procedure Laterality Date  . ABDOMINAL HYSTERECTOMY    . CARDIAC CATHETERIZATION  09/12/2017  . CORONARY STENT INTERVENTION  09/12/2017   STENT RESOLUTE ONYX 3.84Y65 drug eluting stent was successfully placed  . CORONARY STENT INTERVENTION N/A 09/12/2017   Procedure: CORONARY STENT INTERVENTION;  Surgeon: Leonie Man, MD;  Location: [place] [facility];  Service: Cardiovascular;  Laterality: N/A;  . LEFT HEART CATH AND CORONARY ANGIOGRAPHY N/A 09/12/2017   Procedure: LEFT HEART CATH AND CORONARY ANGIOGRAPHY;  Surgeon: Leonie Man, MD;  Location: [place] [facility];  Service: Cardiovascular;  Laterality: N/A;  . MASS EXCISION N/A 12/18/2018   Procedure: EXCISION TONGUE MASS;  Surgeon: Leta Baptist, MD;  Location: [place] OR;  Service: ENT;  Laterality: N/A;  . RIGHT/LEFT HEART CATH AND CORONARY ANGIOGRAPHY N/A 09/10/2017   Procedure: RIGHT/LEFT HEART CATH AND CORONARY ANGIOGRAPHY;  Surgeon: Troy Sine, MD;  Location: [place] [facility];  Service: Cardiovascular;  Laterality:  N/A;  . THYROID SURGERY       OB History   No obstetric history on file.      Home Medications    Prior to Admission medications   Medication Sig Start Date End Date Taking? Authorizing Provider  amLODipine (NORVASC) 5 MG tablet TAKE 1 TABLET BY MOUTH EVERY DAY Patient taking differently: Take 5 mg by mouth daily.  11/12/18   Strader, Fransisco Hertz, PA-C  aspirin EC 81 MG tablet Take 81 mg by mouth daily.    [provider]  atorvastatin (LIPITOR) 80 MG tablet Take 1 tablet (80 mg  total) by mouth every evening. 07/16/19   Arnoldo Lenis, MD  carvedilol (COREG) 25 MG tablet Take 1 tablet (25 mg total) by mouth 2 (two) times daily. 11/21/18 06/10/19  Arnoldo Lenis, MD  cetirizine (ZYRTEC) 10 MG tablet Take 10 mg by mouth as needed. 09/12/18   [provider]  clopidogrel (PLAVIX) 75 MG tablet TAKE 1 TABLET(75 MG) BY MOUTH DAILY WITH BREAKFAST 09/02/19   Arnoldo Lenis, MD  doxycycline (VIBRAMYCIN) 100 MG capsule Take 1 capsule (100 mg total) by mouth 2 (two) times daily. 09/06/19   Fredia Sorrow, MD  ENTRESTO 97-103 MG TAKE 1 TABLET BY MOUTH TWICE DAILY 09/02/19   Arnoldo Lenis, MD  furosemide (LASIX) 40 MG tablet Take 1 tablet (40 mg total) by mouth daily. 09/14/17   Ghimire, Henreitta Leber, MD  HYDROcodone-acetaminophen (NORCO/VICODIN) 5-325 MG tablet Take 1 tablet by mouth every 6 (six) hours as needed for moderate pain. 09/06/19   Fredia Sorrow, MD  insulin aspart (NOVOLOG) 100 UNIT/ML injection Inject 20 Units into the skin 3 (three) times daily before meals.     [provider]  insulin degludec (TRESIBA) 100 UNIT/ML SOPN FlexTouch Pen Inject 0.16 mLs (16 Units total) into the skin daily. Please include needles as well for injection Patient taking differently: Inject 50 Units into the skin daily. Please include needles as well for injection 07/18/18   Eulogio Bear U, DO  Insulin Pen Needle 32G X 4 MM MISC For use with lantus solostar-- change substitute if needed 07/18/18   Geradine Girt, DO  isosorbide mononitrate (IMDUR) 60 MG 24 hr tablet Take 1 tablet (60 mg total) by mouth daily. 08/20/18   Strader, Fransisco Hertz, PA-C  JARDIANCE 10 MG TABS tablet Take 1 tablet by mouth daily. 05/01/19   [provider]  levothyroxine (SYNTHROID, LEVOTHROID) 125 MCG tablet Take 125 mcg by mouth daily before breakfast.  08/16/18   [provider]  methocarbamol (ROBAXIN) 500 MG tablet Take 1 tablet (500 mg total) by mouth 3 (three) times daily.  05/22/19   Triplett, Tammy, PA-C  ONETOUCH VERIO test strip USE TO TEST BLOOD SUGAR BID 01/05/18   [provider]  oxyCODONE-acetaminophen (PERCOCET/ROXICET) 5-325 MG tablet Take 2 tablets by mouth every 4 (four) hours as needed for severe pain. 05/22/19   Triplett, Tammy, PA-C  oxyCODONE-acetaminophen (PERCOCET/ROXICET) 5-325 MG tablet Take 1 tablet by mouth every 4 (four) hours as needed. 05/22/19   Triplett, Tammy, PA-C  spironolactone (ALDACTONE) 25 MG tablet TAKE 1 TABLET(25 MG) BY MOUTH DAILY 09/02/19   Arnoldo Lenis, MD  timolol (TIMOPTIC) 0.5 % ophthalmic solution Place 1 drop into both eyes 2 (two) times daily. 10/07/18   [provider]  VENTOLIN HFA 108 (90 Base) MCG/ACT inhaler Inhale 1-2 puffs into the lungs every 6 (six) hours as needed for wheezing or shortness of breath. Patient  taking differently: Inhale 1-2 puffs into the lungs as needed for wheezing or shortness of breath.  09/14/17   Ghimire, Henreitta Leber, MD    Family History Family History  Problem Relation Age of Onset  . Diabetes Mother   . Hypertension Mother   . Cancer Mother        pancreas  . Hypertension Sister     Social History Social History   Tobacco Use  . Smoking status: Former Smoker    Quit date: 12/14/2005    Years since quitting: 13.7  . Smokeless tobacco: Never Used  . Tobacco comment: QUIT IN 2005  Substance Use Topics  . Alcohol use: No  . Drug use: No     Allergies   Aspirin   Review of Systems Review of Systems  Constitutional: Negative for chills and fever.  HENT: Negative for congestion, rhinorrhea and sore throat.   Eyes: Negative for visual disturbance.  Respiratory: Negative for cough and shortness of breath.   Cardiovascular: Negative for chest pain and leg swelling.  Gastrointestinal: Positive for nausea. Negative for abdominal pain, diarrhea and vomiting.  Genitourinary: Negative for dysuria.  Musculoskeletal: Negative for back pain and neck pain.  Skin:  Positive for wound. Negative for rash.  Neurological: Negative for dizziness, light-headedness and headaches.  Hematological: Does not bruise/bleed easily.  Psychiatric/Behavioral: Negative for confusion.     Physical Exam Updated Vital Signs BP 115/69 (BP Location: Left Arm)   Pulse 91   Temp 99.4 F (37.4 C) (Oral)   Ht 1.651 m (5\' 5" )   Wt (!) 152 kg   SpO2 96%   BMI 55.75 kg/m   Physical Exam Vitals signs and nursing note reviewed.  Constitutional:      General: She is not in acute distress.    Appearance: Normal appearance. She is well-developed. She is obese. She is not ill-appearing or toxic-appearing.  HENT:     Head: Normocephalic and atraumatic.  Eyes:     Extraocular Movements: Extraocular movements intact.     Conjunctiva/sclera: Conjunctivae normal.     Pupils: Pupils are equal, round, and reactive to light.  Neck:     Musculoskeletal: Normal range of motion and neck supple.  Cardiovascular:     Rate and Rhythm: Normal rate and regular rhythm.     Heart sounds: No murmur.  Pulmonary:     Effort: Pulmonary effort is normal. No respiratory distress.     Breath sounds: Normal breath sounds.  Abdominal:     Palpations: Abdomen is soft.     Tenderness: There is no abdominal tenderness.  Genitourinary:    Comments: Left labial area outside not on the inner side with an area of induration that is measuring about 10 cm.  A draining abscess measuring about 2 cm.  No fluctuance. Musculoskeletal: Normal range of motion.  Skin:    General: Skin is warm and dry.  Neurological:     General: No focal deficit present.     Mental Status: She is alert and oriented to person, place, and time.      ED Treatments / Results  Labs (all labs ordered are listed, but only abnormal results are displayed) Labs Reviewed  CBG MONITORING, ED - Abnormal; Notable for the following components:      Result Value   Glucose-Capillary 250 (*)    All other components within normal  limits    EKG None  Radiology No results found.  Procedures Procedures (including critical care time)  Medications  Ordered in ED Medications  ondansetron (ZOFRAN-ODT) disintegrating tablet 4 mg (has no administration in time range)  HYDROcodone-acetaminophen (NORCO/VICODIN) 5-325 MG per tablet 1 tablet (has no administration in time range)  doxycycline (VIBRA-TABS) tablet 100 mg (has no administration in time range)     Initial Impression / Assessment and Plan / ED Course  I have reviewed the triage vital signs and the nursing notes.  Pertinent labs & imaging results that were available during my care of the patient were reviewed by me and considered in my medical decision making (see chart for details).        Patient currently nontoxic.  But location of the abscess that is draining is concerning there is marked induration involving most of the left labia.  No fluctuance nothing to open now secondary to the current draining that is already occurring.  Large amount of pus coming out.  Patient has follow-up with primary care doctor already scheduled for Tuesday.  Blood sugar here reasonable.  We will treat with doxycycline antibiotic and oral pain medicine.  First dose provided here.  We will also treat with antinausea medicine.  Close observation required.  Will recommend sitting in a warm tub water to help keep the area clean to help the abscess drain for 20 minutes each day.  Patient will return for any new or worse symptoms.  Did discuss that this location can turn into serious infection sometimes.  Otherwise patient will follow-up with primary care doctor on Tuesday.  If patient thinks anything is getting worse she will return here for follow-up before Tuesday.  Final Clinical Impressions(s) / ED Diagnoses   Final diagnoses:  Abscess    ED Discharge Orders         Ordered    doxycycline (VIBRAMYCIN) 100 MG capsule  2 times daily     09/06/19 0840     HYDROcodone-acetaminophen (NORCO/VICODIN) 5-325 MG tablet  Every 6 hours PRN     09/06/19 0840           Fredia Sorrow, MD 09/06/19 [phone]

## 2019-09-08 ENCOUNTER — Other Ambulatory Visit: Payer: Self-pay

## 2019-09-08 ENCOUNTER — Encounter (HOSPITAL_COMMUNITY): Payer: Self-pay

## 2019-09-08 ENCOUNTER — Inpatient Hospital Stay (HOSPITAL_COMMUNITY)
Admission: EM | Admit: 2019-09-08 | Discharge: 2019-10-02 | DRG: 853 | Disposition: A | Discharge status: Skilled Nursing Facility | Payer: Medicare HMO | Attending: Internal Medicine | Admitting: Internal Medicine

## 2019-09-08 ENCOUNTER — Emergency Department (HOSPITAL_COMMUNITY): Payer: Medicare HMO

## 2019-09-08 DIAGNOSIS — R400 Somnolence: Secondary | ICD-10-CM | POA: Diagnosis not present

## 2019-09-08 DIAGNOSIS — L02215 Cutaneous abscess of perineum: Secondary | ICD-10-CM | POA: Diagnosis present

## 2019-09-08 DIAGNOSIS — E662 Morbid (severe) obesity with alveolar hypoventilation: Secondary | ICD-10-CM | POA: Diagnosis present

## 2019-09-08 DIAGNOSIS — Z87891 Personal history of nicotine dependence: Secondary | ICD-10-CM

## 2019-09-08 DIAGNOSIS — G9341 Metabolic encephalopathy: Secondary | ICD-10-CM | POA: Diagnosis present

## 2019-09-08 DIAGNOSIS — E1122 Type 2 diabetes mellitus with diabetic chronic kidney disease: Secondary | ICD-10-CM | POA: Diagnosis present

## 2019-09-08 DIAGNOSIS — E1129 Type 2 diabetes mellitus with other diabetic kidney complication: Secondary | ICD-10-CM | POA: Diagnosis not present

## 2019-09-08 DIAGNOSIS — R590 Localized enlarged lymph nodes: Secondary | ICD-10-CM | POA: Diagnosis not present

## 2019-09-08 DIAGNOSIS — J189 Pneumonia, unspecified organism: Secondary | ICD-10-CM | POA: Diagnosis not present

## 2019-09-08 DIAGNOSIS — R6520 Severe sepsis without septic shock: Secondary | ICD-10-CM | POA: Diagnosis present

## 2019-09-08 DIAGNOSIS — N170 Acute kidney failure with tubular necrosis: Secondary | ICD-10-CM | POA: Diagnosis not present

## 2019-09-08 DIAGNOSIS — N7689 Other specified inflammation of vagina and vulva: Secondary | ICD-10-CM | POA: Diagnosis not present

## 2019-09-08 DIAGNOSIS — Z7401 Bed confinement status: Secondary | ICD-10-CM | POA: Diagnosis not present

## 2019-09-08 DIAGNOSIS — A419 Sepsis, unspecified organism: Principal | ICD-10-CM | POA: Diagnosis present

## 2019-09-08 DIAGNOSIS — R7989 Other specified abnormal findings of blood chemistry: Secondary | ICD-10-CM

## 2019-09-08 DIAGNOSIS — L270 Generalized skin eruption due to drugs and medicaments taken internally: Secondary | ICD-10-CM

## 2019-09-08 DIAGNOSIS — M2550 Pain in unspecified joint: Secondary | ICD-10-CM | POA: Diagnosis not present

## 2019-09-08 DIAGNOSIS — R52 Pain, unspecified: Secondary | ICD-10-CM | POA: Diagnosis not present

## 2019-09-08 DIAGNOSIS — Z7902 Long term (current) use of antithrombotics/antiplatelets: Secondary | ICD-10-CM | POA: Diagnosis not present

## 2019-09-08 DIAGNOSIS — N189 Chronic kidney disease, unspecified: Secondary | ICD-10-CM | POA: Diagnosis present

## 2019-09-08 DIAGNOSIS — D6489 Other specified anemias: Secondary | ICD-10-CM | POA: Diagnosis present

## 2019-09-08 DIAGNOSIS — N764 Abscess of vulva: Secondary | ICD-10-CM

## 2019-09-08 DIAGNOSIS — R32 Unspecified urinary incontinence: Secondary | ICD-10-CM | POA: Diagnosis not present

## 2019-09-08 DIAGNOSIS — L02214 Cutaneous abscess of groin: Secondary | ICD-10-CM | POA: Diagnosis not present

## 2019-09-08 DIAGNOSIS — L89322 Pressure ulcer of left buttock, stage 2: Secondary | ICD-10-CM | POA: Diagnosis present

## 2019-09-08 DIAGNOSIS — T368X5A Adverse effect of other systemic antibiotics, initial encounter: Secondary | ICD-10-CM | POA: Diagnosis not present

## 2019-09-08 DIAGNOSIS — Z741 Need for assistance with personal care: Secondary | ICD-10-CM | POA: Diagnosis not present

## 2019-09-08 DIAGNOSIS — J984 Other disorders of lung: Secondary | ICD-10-CM | POA: Diagnosis not present

## 2019-09-08 DIAGNOSIS — L509 Urticaria, unspecified: Secondary | ICD-10-CM | POA: Diagnosis not present

## 2019-09-08 DIAGNOSIS — R21 Rash and other nonspecific skin eruption: Secondary | ICD-10-CM | POA: Diagnosis not present

## 2019-09-08 DIAGNOSIS — Z8249 Family history of ischemic heart disease and other diseases of the circulatory system: Secondary | ICD-10-CM

## 2019-09-08 DIAGNOSIS — Z8673 Personal history of transient ischemic attack (TIA), and cerebral infarction without residual deficits: Secondary | ICD-10-CM

## 2019-09-08 DIAGNOSIS — N898 Other specified noninflammatory disorders of vagina: Secondary | ICD-10-CM | POA: Diagnosis not present

## 2019-09-08 DIAGNOSIS — Z20828 Contact with and (suspected) exposure to other viral communicable diseases: Secondary | ICD-10-CM | POA: Diagnosis not present

## 2019-09-08 DIAGNOSIS — I5023 Acute on chronic systolic (congestive) heart failure: Secondary | ICD-10-CM | POA: Diagnosis not present

## 2019-09-08 DIAGNOSIS — Z6841 Body Mass Index (BMI) 40.0 and over, adult: Secondary | ICD-10-CM

## 2019-09-08 DIAGNOSIS — N179 Acute kidney failure, unspecified: Secondary | ICD-10-CM

## 2019-09-08 DIAGNOSIS — I10 Essential (primary) hypertension: Secondary | ICD-10-CM | POA: Diagnosis present

## 2019-09-08 DIAGNOSIS — Z4901 Encounter for fitting and adjustment of extracorporeal dialysis catheter: Secondary | ICD-10-CM | POA: Diagnosis not present

## 2019-09-08 DIAGNOSIS — G4733 Obstructive sleep apnea (adult) (pediatric): Secondary | ICD-10-CM | POA: Diagnosis not present

## 2019-09-08 DIAGNOSIS — I5042 Chronic combined systolic (congestive) and diastolic (congestive) heart failure: Secondary | ICD-10-CM | POA: Diagnosis present

## 2019-09-08 DIAGNOSIS — I2510 Atherosclerotic heart disease of native coronary artery without angina pectoris: Secondary | ICD-10-CM | POA: Diagnosis present

## 2019-09-08 DIAGNOSIS — D649 Anemia, unspecified: Secondary | ICD-10-CM | POA: Diagnosis not present

## 2019-09-08 DIAGNOSIS — Z886 Allergy status to analgesic agent status: Secondary | ICD-10-CM | POA: Diagnosis not present

## 2019-09-08 DIAGNOSIS — Z79890 Hormone replacement therapy (postmenopausal): Secondary | ICD-10-CM

## 2019-09-08 DIAGNOSIS — I130 Hypertensive heart and chronic kidney disease with heart failure and stage 1 through stage 4 chronic kidney disease, or unspecified chronic kidney disease: Secondary | ICD-10-CM | POA: Diagnosis present

## 2019-09-08 DIAGNOSIS — E872 Acidosis: Secondary | ICD-10-CM | POA: Diagnosis present

## 2019-09-08 DIAGNOSIS — E875 Hyperkalemia: Secondary | ICD-10-CM | POA: Diagnosis not present

## 2019-09-08 DIAGNOSIS — L988 Other specified disorders of the skin and subcutaneous tissue: Secondary | ICD-10-CM

## 2019-09-08 DIAGNOSIS — E1159 Type 2 diabetes mellitus with other circulatory complications: Secondary | ICD-10-CM | POA: Diagnosis present

## 2019-09-08 DIAGNOSIS — M6281 Muscle weakness (generalized): Secondary | ICD-10-CM | POA: Diagnosis not present

## 2019-09-08 DIAGNOSIS — I255 Ischemic cardiomyopathy: Secondary | ICD-10-CM | POA: Diagnosis not present

## 2019-09-08 DIAGNOSIS — E039 Hypothyroidism, unspecified: Secondary | ICD-10-CM | POA: Diagnosis not present

## 2019-09-08 DIAGNOSIS — R0600 Dyspnea, unspecified: Secondary | ICD-10-CM | POA: Diagnosis not present

## 2019-09-08 DIAGNOSIS — E785 Hyperlipidemia, unspecified: Secondary | ICD-10-CM | POA: Diagnosis present

## 2019-09-08 DIAGNOSIS — Z960 Presence of urogenital implants: Secondary | ICD-10-CM | POA: Diagnosis not present

## 2019-09-08 DIAGNOSIS — N7682 Fournier disease of vagina and vulva: Secondary | ICD-10-CM | POA: Diagnosis present

## 2019-09-08 DIAGNOSIS — I110 Hypertensive heart disease with heart failure: Secondary | ICD-10-CM | POA: Diagnosis not present

## 2019-09-08 DIAGNOSIS — Z955 Presence of coronary angioplasty implant and graft: Secondary | ICD-10-CM

## 2019-09-08 DIAGNOSIS — K651 Peritoneal abscess: Secondary | ICD-10-CM | POA: Diagnosis not present

## 2019-09-08 DIAGNOSIS — R4182 Altered mental status, unspecified: Secondary | ICD-10-CM | POA: Diagnosis not present

## 2019-09-08 DIAGNOSIS — G4700 Insomnia, unspecified: Secondary | ICD-10-CM | POA: Diagnosis present

## 2019-09-08 DIAGNOSIS — E1152 Type 2 diabetes mellitus with diabetic peripheral angiopathy with gangrene: Secondary | ICD-10-CM | POA: Diagnosis present

## 2019-09-08 DIAGNOSIS — Z90710 Acquired absence of both cervix and uterus: Secondary | ICD-10-CM | POA: Diagnosis not present

## 2019-09-08 DIAGNOSIS — E1165 Type 2 diabetes mellitus with hyperglycemia: Secondary | ICD-10-CM | POA: Diagnosis present

## 2019-09-08 DIAGNOSIS — Z7982 Long term (current) use of aspirin: Secondary | ICD-10-CM

## 2019-09-08 DIAGNOSIS — Z794 Long term (current) use of insulin: Secondary | ICD-10-CM | POA: Diagnosis not present

## 2019-09-08 DIAGNOSIS — R262 Difficulty in walking, not elsewhere classified: Secondary | ICD-10-CM | POA: Diagnosis not present

## 2019-09-08 DIAGNOSIS — E871 Hypo-osmolality and hyponatremia: Secondary | ICD-10-CM | POA: Diagnosis present

## 2019-09-08 DIAGNOSIS — L8990 Pressure ulcer of unspecified site, unspecified stage: Secondary | ICD-10-CM | POA: Insufficient documentation

## 2019-09-08 DIAGNOSIS — N1832 Chronic kidney disease, stage 3b: Secondary | ICD-10-CM

## 2019-09-08 DIAGNOSIS — IMO0002 Reserved for concepts with insufficient information to code with codable children: Secondary | ICD-10-CM | POA: Diagnosis present

## 2019-09-08 DIAGNOSIS — R509 Fever, unspecified: Secondary | ICD-10-CM | POA: Diagnosis not present

## 2019-09-08 DIAGNOSIS — R918 Other nonspecific abnormal finding of lung field: Secondary | ICD-10-CM | POA: Diagnosis not present

## 2019-09-08 DIAGNOSIS — I5043 Acute on chronic combined systolic (congestive) and diastolic (congestive) heart failure: Secondary | ICD-10-CM | POA: Diagnosis not present

## 2019-09-08 DIAGNOSIS — L0291 Cutaneous abscess, unspecified: Secondary | ICD-10-CM | POA: Diagnosis present

## 2019-09-08 DIAGNOSIS — Z79899 Other long term (current) drug therapy: Secondary | ICD-10-CM | POA: Diagnosis not present

## 2019-09-08 DIAGNOSIS — E119 Type 2 diabetes mellitus without complications: Secondary | ICD-10-CM | POA: Diagnosis not present

## 2019-09-08 DIAGNOSIS — E118 Type 2 diabetes mellitus with unspecified complications: Secondary | ICD-10-CM | POA: Diagnosis not present

## 2019-09-08 LAB — PROTIME-INR
INR: 1.2 (ref 0.8–1.2)
Prothrombin Time: 15.5 seconds — ABNORMAL HIGH (ref 11.4–15.2)

## 2019-09-08 LAB — CBC WITH DIFFERENTIAL/PLATELET
Abs Immature Granulocytes: 0.21 10*3/uL — ABNORMAL HIGH (ref 0.00–0.07)
Basophils Absolute: 0 10*3/uL (ref 0.0–0.1)
Basophils Relative: 0 %
Eosinophils Absolute: 0.1 10*3/uL (ref 0.0–0.5)
Eosinophils Relative: 0 %
HCT: 31 % — ABNORMAL LOW (ref 36.0–46.0)
Hemoglobin: 9.7 g/dL — ABNORMAL LOW (ref 12.0–15.0)
Immature Granulocytes: 1 %
Lymphocytes Relative: 11 %
Lymphs Abs: 2.4 10*3/uL (ref 0.7–4.0)
MCH: 27.5 pg (ref 26.0–34.0)
MCHC: 31.3 g/dL (ref 30.0–36.0)
MCV: 87.8 fL (ref 80.0–100.0)
Monocytes Absolute: 1.5 10*3/uL — ABNORMAL HIGH (ref 0.1–1.0)
Monocytes Relative: 7 %
Neutro Abs: 16.8 10*3/uL — ABNORMAL HIGH (ref 1.7–7.7)
Neutrophils Relative %: 81 %
Platelets: 222 10*3/uL (ref 150–400)
RBC: 3.53 MIL/uL — ABNORMAL LOW (ref 3.87–5.11)
RDW: 13.3 % (ref 11.5–15.5)
WBC: 21 10*3/uL — ABNORMAL HIGH (ref 4.0–10.5)
nRBC: 0 % (ref 0.0–0.2)

## 2019-09-08 LAB — GLUCOSE, CAPILLARY: Glucose-Capillary: 228 mg/dL — ABNORMAL HIGH (ref 70–99)

## 2019-09-08 LAB — URINALYSIS, ROUTINE W REFLEX MICROSCOPIC
Bilirubin Urine: NEGATIVE
Glucose, UA: NEGATIVE mg/dL
Ketones, ur: NEGATIVE mg/dL
Leukocytes,Ua: NEGATIVE
Nitrite: NEGATIVE
Protein, ur: 100 mg/dL — AB
Specific Gravity, Urine: 1.024 (ref 1.005–1.030)
pH: 5 (ref 5.0–8.0)

## 2019-09-08 LAB — COMPREHENSIVE METABOLIC PANEL
ALT: 32 U/L (ref 0–44)
AST: 33 U/L (ref 15–41)
Albumin: 2.6 g/dL — ABNORMAL LOW (ref 3.5–5.0)
Alkaline Phosphatase: 183 U/L — ABNORMAL HIGH (ref 38–126)
Anion gap: 10 (ref 5–15)
BUN: 30 mg/dL — ABNORMAL HIGH (ref 8–23)
CO2: 23 mmol/L (ref 22–32)
Calcium: 7.3 mg/dL — ABNORMAL LOW (ref 8.9–10.3)
Chloride: 100 mmol/L (ref 98–111)
Creatinine, Ser: 1.88 mg/dL — ABNORMAL HIGH (ref 0.44–1.00)
GFR calc Af Amer: 33 mL/min — ABNORMAL LOW (ref >60)
GFR calc non Af Amer: 28 mL/min — ABNORMAL LOW (ref >60)
Glucose, Bld: 268 mg/dL — ABNORMAL HIGH (ref 70–99)
Potassium: 3.6 mmol/L (ref 3.5–5.1)
Sodium: 133 mmol/L — ABNORMAL LOW (ref 135–145)
Total Bilirubin: 1.7 mg/dL — ABNORMAL HIGH (ref 0.3–1.2)
Total Protein: 7.2 g/dL (ref 6.5–8.1)

## 2019-09-08 LAB — APTT: aPTT: 30 seconds (ref 24–36)

## 2019-09-08 LAB — SARS CORONAVIRUS 2 BY RT PCR (HOSPITAL ORDER, PERFORMED IN ~~LOC~~ HOSPITAL LAB): SARS Coronavirus 2: NEGATIVE

## 2019-09-08 LAB — LACTIC ACID, PLASMA
Lactic Acid, Venous: 1.9 mmol/L (ref 0.5–1.9)
Lactic Acid, Venous: 1.5 mmol/L (ref 0.5–1.9)

## 2019-09-08 MED ORDER — VANCOMYCIN HCL 10 G IV SOLR
1500.0000 mg | Freq: Once | INTRAVENOUS | Status: AC
  Administered 2019-09-09: 1500 mg via INTRAVENOUS
  Filled 2019-09-08: qty 1500

## 2019-09-08 MED ORDER — ATORVASTATIN CALCIUM 80 MG PO TABS
80.0000 mg | ORAL_TABLET | Freq: Every evening | ORAL | Status: DC
  Administered 2019-09-09 – 2019-10-01 (×23): 80 mg via ORAL
  Filled 2019-09-08 (×13): qty 1
  Filled 2019-09-08: qty 2
  Filled 2019-09-08 (×6): qty 1
  Filled 2019-09-08: qty 2
  Filled 2019-09-08: qty 1

## 2019-09-08 MED ORDER — SODIUM CHLORIDE 0.9 % IV SOLN
2.0000 g | INTRAVENOUS | Status: DC
  Administered 2019-09-09 – 2019-09-11 (×4): 2 g via INTRAVENOUS
  Filled 2019-09-08 (×2): qty 20
  Filled 2019-09-08: qty 2
  Filled 2019-09-08: qty 20

## 2019-09-08 MED ORDER — INSULIN ASPART 100 UNIT/ML ~~LOC~~ SOLN
0.0000 [IU] | Freq: Three times a day (TID) | SUBCUTANEOUS | Status: DC
  Administered 2019-09-09: 08:00:00 5 [IU] via SUBCUTANEOUS
  Administered 2019-09-09 (×2): 3 [IU] via SUBCUTANEOUS
  Administered 2019-09-10 (×3): 2 [IU] via SUBCUTANEOUS

## 2019-09-08 MED ORDER — SODIUM CHLORIDE 0.9 % IV BOLUS: 2000.0000 mL | Freq: Once | INTRAVENOUS | Status: DC

## 2019-09-08 MED ORDER — LEVOTHYROXINE SODIUM 25 MCG PO TABS
125.0000 ug | ORAL_TABLET | Freq: Every day | ORAL | Status: DC
  Administered 2019-09-09 – 2019-10-02 (×23): 125 ug via ORAL
  Filled 2019-09-08 (×24): qty 1

## 2019-09-08 MED ORDER — CLOPIDOGREL BISULFATE 75 MG PO TABS
75.0000 mg | ORAL_TABLET | Freq: Every morning | ORAL | Status: DC
  Administered 2019-09-10 – 2019-09-12 (×3): 75 mg via ORAL
  Filled 2019-09-08 (×5): qty 1

## 2019-09-08 MED ORDER — NYSTATIN 100000 UNIT/GM EX POWD
Freq: Three times a day (TID) | CUTANEOUS | Status: DC
  Administered 2019-09-09 – 2019-10-02 (×66): via TOPICAL
  Filled 2019-09-08 (×8): qty 15

## 2019-09-08 MED ORDER — ACETAMINOPHEN 325 MG PO TABS
650.0000 mg | ORAL_TABLET | Freq: Once | ORAL | Status: AC
  Administered 2019-09-08: 650 mg via ORAL
  Filled 2019-09-08: qty 2

## 2019-09-08 MED ORDER — PIPERACILLIN-TAZOBACTAM 3.375 G IVPB 30 MIN
3.3750 g | Freq: Once | INTRAVENOUS | Status: AC
  Administered 2019-09-08: 16:00:00 3.375 g via INTRAVENOUS
  Filled 2019-09-08: qty 50

## 2019-09-08 MED ORDER — INSULIN GLARGINE 100 UNIT/ML ~~LOC~~ SOLN
25.0000 [IU] | Freq: Every day | SUBCUTANEOUS | Status: DC
  Administered 2019-09-09 – 2019-09-12 (×5): 25 [IU] via SUBCUTANEOUS
  Filled 2019-09-08 (×6): qty 0.25

## 2019-09-08 MED ORDER — ONDANSETRON HCL 4 MG/2ML IJ SOLN
INTRAMUSCULAR | Status: AC
  Filled 2019-09-08: qty 2

## 2019-09-08 MED ORDER — IPRATROPIUM-ALBUTEROL 0.5-2.5 (3) MG/3ML IN SOLN: 3.0000 mL | RESPIRATORY_TRACT | Status: DC | PRN

## 2019-09-08 MED ORDER — SODIUM CHLORIDE 0.9 % IV BOLUS
500.0000 mL | Freq: Once | INTRAVENOUS | Status: AC
  Administered 2019-09-08: 18:00:00 500 mL via INTRAVENOUS

## 2019-09-08 MED ORDER — ONDANSETRON HCL 4 MG/2ML IJ SOLN
4.0000 mg | Freq: Once | INTRAMUSCULAR | Status: AC
  Administered 2019-09-08: 4 mg via INTRAVENOUS

## 2019-09-08 MED ORDER — VANCOMYCIN HCL IN DEXTROSE 1-5 GM/200ML-% IV SOLN
1000.0000 mg | Freq: Once | INTRAVENOUS | Status: AC
  Administered 2019-09-08: 18:00:00 1000 mg via INTRAVENOUS
  Filled 2019-09-08: qty 200

## 2019-09-08 MED ORDER — ASPIRIN EC 81 MG PO TBEC
81.0000 mg | DELAYED_RELEASE_TABLET | Freq: Every day | ORAL | Status: DC
  Administered 2019-09-09 – 2019-09-12 (×4): 81 mg via ORAL
  Filled 2019-09-08 (×7): qty 1

## 2019-09-08 MED ORDER — SODIUM CHLORIDE 0.9 % IV BOLUS
500.0000 mL | Freq: Once | INTRAVENOUS | Status: AC
  Administered 2019-09-08: 16:00:00 500 mL via INTRAVENOUS

## 2019-09-08 MED ORDER — SODIUM CHLORIDE 0.9 % IV SOLN
500.0000 mg | INTRAVENOUS | Status: DC
  Administered 2019-09-09 – 2019-09-11 (×4): 500 mg via INTRAVENOUS
  Filled 2019-09-08 (×5): qty 500

## 2019-09-08 MED ORDER — GUAIFENESIN-DM 100-10 MG/5ML PO SYRP
10.0000 mL | ORAL_SOLUTION | Freq: Four times a day (QID) | ORAL | Status: AC
  Administered 2019-09-09 – 2019-09-10 (×6): 10 mL via ORAL
  Filled 2019-09-08 (×6): qty 10

## 2019-09-08 MED ORDER — INSULIN DEGLUDEC 100 UNIT/ML ~~LOC~~ SOPN: 25.0000 [IU] | PEN_INJECTOR | Freq: Every day | SUBCUTANEOUS | Status: DC

## 2019-09-08 MED ORDER — INSULIN ASPART 100 UNIT/ML ~~LOC~~ SOLN
0.0000 [IU] | Freq: Every day | SUBCUTANEOUS | Status: DC
  Administered 2019-09-09: 2 [IU] via SUBCUTANEOUS

## 2019-09-08 NOTE — H&P (Signed)
History and Physical    Maureen Jackson QMV:[id number] DOB: Jan 12, 1956 DOA: 09/08/2019  PCP: Neale Burly, MD   Patient coming from: Home  I have personally briefly reviewed patient's old medical records in [place]  Chief Complaint: Weakness, cough  HPI: Maureen Jackson is a 63 y.o. female with medical history significant for morbid obesity, diastolic CHF, hypothyroidism, hypertension, obesity, OSA, coronary artery disease, presented to the ED with complaints of generalized weakness, drowsiness, difficulty voiding, poor appetite and difficulty sleeping over the past 3 days.  Patient reports a severe dry cough that started yesterday, she denies difficulty breathing, no chest pain.  No lower extremity swelling redness or pain. No vomiting no loose stools, no abdominal pain, but she was in the ED 2 days ago 09/06/2019 for abscess in her left labia/upper thigh area that started 2 days prior.   Abscess had been draining.  Fluctuance is appreciated, but large amount of pus was draining from area.  Patient was discharged home on doxycycline antibiotic and pain medication.  ED Course: 102.2.  Blood pressure systolic 28-413, O2 sats greater than 92% on room air.  COVID-19 test negative.  WBC 21.  With normal lactic acid 1.9.  UA shows few bacteria with small hemoglobin.  Creatinine elevated 1.8 from baseline 0.9-1.2.  Portable chest x-ray shows multifocal pneumonia underlying pulmonary vascular congestion.  Spectrum antibiotics IV vancomycin and Zosyn given.  2.5 L bolus normal saline given.  Hospitalist to admit for pneumonia with sepsis.  Review of Systems: As per HPI all other systems reviewed and negative.  Past Medical History:  Diagnosis Date  . 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 Bleeding from mouth    when she brushed teeth or ate  . Chronic systolic CHF (congestive heart failure) ([place])   . Coronary artery disease 09/2017   a. multivessel CAD by cath in 09/2017 and not felt to be a  CABG candidate --> underwent two-vessel PCI with DES to the LAD and DES to the RCA  . Diabetes mellitus   . Dyspnea   . Glaucoma   . Hypertension   . Left bundle branch block   . Morbid obesity ([place])   . Obstructive sleep apnea    does not wear CPAP  . Thyroid disease     Past Surgical History:  Procedure Laterality Date  . ABDOMINAL HYSTERECTOMY    . CARDIAC CATHETERIZATION  09/12/2017  . CORONARY STENT INTERVENTION  09/12/2017   STENT RESOLUTE ONYX 2.44W10 drug eluting stent was successfully placed  . CORONARY STENT INTERVENTION N/A 09/12/2017   Procedure: CORONARY STENT INTERVENTION;  Surgeon: Leonie Man, MD;  Location: [facility];  Service: Cardiovascular;  Laterality: N/A;  . LEFT HEART CATH AND CORONARY ANGIOGRAPHY N/A 09/12/2017   Procedure: LEFT HEART CATH AND CORONARY ANGIOGRAPHY;  Surgeon: Leonie Man, MD;  Location: [facility];  Service: Cardiovascular;  Laterality: N/A;  . MASS EXCISION N/A 12/18/2018   Procedure: EXCISION TONGUE MASS;  Surgeon: Leta Baptist, MD;  Location: [facility];  Service: ENT;  Laterality: N/A;  . RIGHT/LEFT HEART CATH AND CORONARY ANGIOGRAPHY N/A 09/10/2017   Procedure: RIGHT/LEFT HEART CATH AND CORONARY ANGIOGRAPHY;  Surgeon: Troy Sine, MD;  Location: [facility];  Service: Cardiovascular;  Laterality: N/A;  . THYROID SURGERY       reports that she quit smoking about 13 years ago. She has never used smokeless tobacco. She reports that she does  not drink alcohol or use drugs.  Allergies  Allergen Reactions  . Aspirin Nausea Only    Family History  Problem Relation Age of Onset  . Diabetes Mother   . Hypertension Mother   . Cancer Mother        pancreas  . Hypertension Sister     Prior to Admission medications   Medication Sig Start Date End Date Taking? Authorizing Provider  amLODipine (NORVASC) 5 MG tablet TAKE 1 TABLET BY MOUTH EVERY DAY Patient taking differently: Take 5 mg by mouth daily.  11/12/18  Yes  Strader, Fransisco Hertz, PA-C  aspirin EC 81 MG tablet Take 81 mg by mouth daily.   Yes [provider]  atorvastatin (LIPITOR) 80 MG tablet Take 1 tablet (80 mg total) by mouth every evening. 07/16/19  Yes BranchAlphonse Guild, MD  carvedilol (COREG) 25 MG tablet Take 1 tablet (25 mg total) by mouth 2 (two) times daily. 11/21/18 09/08/19 Yes BranchAlphonse Guild, MD  cetirizine (ZYRTEC) 10 MG tablet Take 10 mg by mouth daily as needed for allergies.  09/12/18  Yes [provider]  clopidogrel (PLAVIX) 75 MG tablet TAKE 1 TABLET(75 MG) BY MOUTH DAILY WITH BREAKFAST Patient taking differently: Take 75 mg by mouth every morning.  09/02/19  Yes Branch, Alphonse Guild, MD  doxycycline (VIBRAMYCIN) 100 MG capsule Take 1 capsule (100 mg total) by mouth 2 (two) times daily. 09/06/19  Yes Fredia Sorrow, MD  ENTRESTO 97-103 MG TAKE 1 TABLET BY MOUTH TWICE DAILY Patient taking differently: Take 1 tablet by mouth 2 (two) times daily.  09/02/19  Yes Branch, Alphonse Guild, MD  furosemide (LASIX) 40 MG tablet Take 1 tablet (40 mg total) by mouth daily. 09/14/17  Yes Ghimire, Henreitta Leber, MD  insulin degludec (TRESIBA) 100 UNIT/ML SOPN FlexTouch Pen Inject 0.16 mLs (16 Units total) into the skin daily. Please include needles as well for injection Patient taking differently: Inject 50 Units into the skin daily.  07/18/18  Yes Vann, Jessica U, DO  JARDIANCE 10 MG TABS tablet Take 1 tablet by mouth daily. 05/01/19  Yes [provider]  levothyroxine (SYNTHROID, LEVOTHROID) 125 MCG tablet Take 125 mcg by mouth daily before breakfast.  08/16/18  Yes [provider]  metFORMIN (GLUCOPHAGE) 1000 MG tablet Take 1,000 mg by mouth 2 (two) times daily. 07/14/19  Yes [provider]  NOVOLOG FLEXPEN 100 UNIT/ML FlexPen Inject 15 Units into the skin 3 (three) times daily with meals.  09/02/19  Yes [provider]  ondansetron (ZOFRAN ODT) 4 MG disintegrating tablet Take 1 tablet (4 mg total) by mouth  every 8 (eight) hours as needed. 09/06/19  Yes Fredia Sorrow, MD  spironolactone (ALDACTONE) 25 MG tablet TAKE 1 TABLET(25 MG) BY MOUTH DAILY Patient taking differently: Take 25 mg by mouth daily.  09/02/19  Yes Branch, Alphonse Guild, MD  timolol (TIMOPTIC) 0.5 % ophthalmic solution Place 1 drop into both eyes 2 (two) times daily. 10/07/18  Yes [provider]  VENTOLIN HFA 108 (90 Base) MCG/ACT inhaler Inhale 1-2 puffs into the lungs every 6 (six) hours as needed for wheezing or shortness of breath. 09/14/17  Yes Ghimire, Henreitta Leber, MD  HYDROcodone-acetaminophen (NORCO/VICODIN) 5-325 MG tablet Take 1 tablet by mouth every 6 (six) hours as needed for moderate pain. Patient not taking: Reported on 09/08/2019 09/06/19   Fredia Sorrow, MD  Insulin Pen Needle 32G X 4 MM MISC For use with lantus solostar-- change substitute if needed Patient not taking:  Reported on 09/08/2019 07/18/18   Geradine Girt, DO  oxyCODONE-acetaminophen (PERCOCET/ROXICET) 5-325 MG tablet Take 2 tablets by mouth every 4 (four) hours as needed for severe pain. Patient not taking: Reported on 09/08/2019 05/22/19   Triplett, Lynelle Smoke, PA-C  oxyCODONE-acetaminophen (PERCOCET/ROXICET) 5-325 MG tablet Take 1 tablet by mouth every 4 (four) hours as needed. Patient not taking: Reported on 09/08/2019 05/22/19   Kem Parkinson, PA-C    Physical Exam: Vitals:   09/08/19 1800 09/08/19 1916 09/08/19 1931 09/08/19 2130  BP: (!) 117/38 (!) 96/46  (!) 115/53  Pulse: 85     Resp: (!) 25 16  (!) 23  Temp:   98.9 F (37.2 C)   TempSrc:      SpO2: 92%     Weight:      Height:        Constitutional: NAD, calm, comfortable Vitals:   09/08/19 1800 09/08/19 1916 09/08/19 1931 09/08/19 2130  BP: (!) 117/38 (!) 96/46  (!) 115/53  Pulse: 85     Resp: (!) 25 16  (!) 23  Temp:   98.9 F (37.2 C)   TempSrc:      SpO2: 92%     Weight:      Height:       Eyes: PERRL, lids and conjunctivae normal ENMT: Mucous membranes are moist.  Posterior pharynx clear of any exudate or lesions.  Neck: normal, supple, no masses, no thyromegaly Respiratory: clear to auscultation bilaterally, no wheezing, no crackles. Normal respiratory effort. No accessory muscle use.  Cardiovascular: Regular rate and rhythm, no murmurs / rubs / gallops. No extremity edema. 2+ pedal pulses.  Abdomen: Morbidly obese, no tenderness, no masses palpated. No hepatosplenomegaly. Bowel sounds positive. Groin area has unpleasant smell likely from intertrigo, with mildly erythematous and raw areas underneath abdominal pannus and in groin area between vulva and left thigh. ~ 3 by ~3 cm indurated area with mild tenderness, involving left lower pubic/vulva area, with ~1 by ~1 cm healing wound where purulent material likely drained. Musculoskeletal: no clubbing / cyanosis. No joint deformity upper and lower extremities. Good ROM, no contractures. Normal muscle tone.  Skin: no rashes, lesions, ulcers. No induration Neurologic: CN 2-12 grossly intact.  Strength 5/5 in all 4.  Psychiatric: Normal judgment and insight. Alert and oriented x 3. Normal mood.   Labs on Admission: I have personally reviewed following labs and imaging studies  CBC: Recent Labs  Lab 09/08/19 1607  WBC 21.0*  NEUTROABS 16.8*  HGB 9.7*  HCT 31.0*  MCV 87.8  PLT 696   Basic Metabolic Panel: Recent Labs  Lab 09/08/19 1607  NA 133*  K 3.6  CL 100  CO2 23  GLUCOSE 268*  BUN 30*  CREATININE 1.88*  CALCIUM 7.3*   Liver Function Tests: Recent Labs  Lab 09/08/19 1607  AST 33  ALT 32  ALKPHOS 183*  BILITOT 1.7*  PROT 7.2  ALBUMIN 2.6*   Coagulation Profile: Recent Labs  Lab 09/08/19 1607  INR 1.2   CBG: Recent Labs  Lab 09/06/19 0820  GLUCAP 250*   Urine analysis:    Component Value Date/Time   COLORURINE AMBER (A) 09/08/2019 1558   APPEARANCEUR CLOUDY (A) 09/08/2019 1558   LABSPEC 1.024 09/08/2019 1558   PHURINE 5.0 09/08/2019 1558   GLUCOSEU NEGATIVE  09/08/2019 1558   HGBUR SMALL (A) 09/08/2019 1558   BILIRUBINUR NEGATIVE 09/08/2019 [place] 09/08/2019 1558   PROTEINUR 100 (A) 09/08/2019 1558   UROBILINOGEN  0.2 10/02/2015 2320   NITRITE NEGATIVE 09/08/2019 1558   LEUKOCYTESUR NEGATIVE 09/08/2019 1558    Radiological Exams on Admission: Dg Chest Port 1 View  Result Date: 09/08/2019 CLINICAL DATA:  Fever EXAM: PORTABLE CHEST 1 VIEW COMPARISON:  July 16, 2018 FINDINGS: There is airspace consolidation in each lower lung zone. There is cardiomegaly with pulmonary venous hypertension. No adenopathy evident. No bone lesions. There is postoperative change in the cervicothoracic junction region. IMPRESSION: Lower lung zone consolidation bilaterally, likely multifocal pneumonia. Underlying pulmonary vascular congestion. No adenopathy evident. Electronically Signed   By: Lowella Grip III M.D.   On: 09/08/2019 17:08    EKG: Pending.  Assessment/Plan Active Problems:   CAP (community acquired pneumonia)   Community-acquired pneumonia-cough without dyspnea or hypoxia.  More of constitutional symptoms.  Recent for sepsis with fever 102, no significant leukocytosis of 21.  Chest x-ray shows multifocal pneumonia.  Pulmonary vascular congestion also suggested.  COVID-19 test negative.  IV vancomycin and Zosyn given in the ED.  2.5 L bolus given in ED -Antibiotic coverage with IV ceftriaxone and azithromycin, will continue IV vancomycin for now with groin abscess. -Urine Legionella -BMP, CBC a.m. -Follow-up blood and urine cultures -Cautious hydration.  Left groin abscess-purulent material drained 2 days ago, still some induration present.  Her habitus makes it hard to tell exactly extent of abscess. -As her symptoms are more constitutional, will continue IV vancomycin for now -Left groin ultrasound -BMP, CBC a.m.  Hypertension-blood pressure soft. -Hold home Norvasc, carvedilol, Lasix, spironolactone in the setting of  sepsis.  Morbid obesity, obstructive sleep apnea-complicates overall care -CPAP nightly  Diastolic and systolic CHF-stable and compensated. No peripheral edema, but weights over the past 6 months.  Chest x-ray also suggests pulmonary vascular congestion, but currently patient is septic.  Last echo 12/2018, LVEF grossly in the range of 35 to 40%, history of grade 1 diastolic dysfunction.  Follows with Dr. Harl Bowie. -Hold home Lasix, Coreg, Entresto, spironolactone for now.  Coronary artery disease-.  Stable.  No chest pain.  DES to LAD and DES x 2 to RCA, LCX disease managed medically. Recs for lifelong plavix per interventional cards. Has been on high dose ASA per neurology. -Resume home aspirin, Plavix.  Diabetes mellitus-random glucose 268. - SSI -Resume home Tresiba at reduced dose 25 nightly, -Hold home metformin and Jardiance.  Hypothyroidism-  -Resume home Synthroid.  DVT prophylaxis: Lovenox Code Status: Full Family Communication: Daughter at bedside Disposition Plan: Per rounding team Consults called: None Admission status: Inpt, tele   Bethena Roys MD [facility]  09/08/2019, 10:36 PM

## 2019-09-08 NOTE — ED Provider Notes (Signed)
[facility] EMERGENCY DEPARTMENT Provider Note   CSN: [id number] Arrival date & time: 09/08/19  1459     History   Chief Complaint Chief Complaint  Patient presents with  . Weakness    HPI Maureen Jackson is a 63 y.o. female.     Patient complains of fever and weakness  The history is provided by the patient. No language interpreter was used.  Weakness Severity:  Moderate Onset quality:  Sudden Timing:  Constant Progression:  Worsening Chronicity:  New Context: not alcohol use   Relieved by:  Nothing Worsened by:  Nothing Ineffective treatments:  None tried Associated symptoms: no abdominal pain, no chest pain, no cough, no diarrhea, no frequency, no headaches and no seizures     Past Medical History:  Diagnosis Date  . 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 Bleeding from mouth    when she brushed teeth or ate  . Chronic systolic CHF (congestive heart failure) ([place])   . Coronary artery disease 09/2017   a. multivessel CAD by cath in 09/2017 and not felt to be a CABG candidate --> underwent two-vessel PCI with DES to the LAD and DES to the RCA  . Diabetes mellitus   . Dyspnea   . Glaucoma   . Hypertension   . Left bundle branch block   . Morbid obesity ([place])   . Obstructive sleep apnea    does not wear CPAP  . Thyroid disease     Patient Active Problem List   Diagnosis Date Noted  . Chest pain 07/16/2018  . Hyperlipidemia 04/10/2018  . TIA (transient ischemic attack) 03/14/2018  . Vertigo 03/14/2018  . Chronic combined systolic and diastolic CHF (congestive heart failure) ([place]) 03/14/2018  . Ischemic cardiomyopathy 11/06/2017  . CAD, multiple vessel   . Pericardial effusion 09/07/2017  . AKI (acute kidney injury) ([place]) 09/07/2017  . Elevated troponin I level 09/07/2017  . Acute combined systolic (congestive) and diastolic (congestive) heart failure ([place]) 09/06/2017  . Cardiomegaly 09/05/2017  . Morbid obesity ([place]) 03/04/2013  . Labyrinthitis 03/04/2013   . Diabetes mellitus type 2 with complications, uncontrolled ([place]) 03/04/2013  . GASTRITIS 12/13/2006  . HIATAL HERNIA, HX OF 12/13/2006  . THYROIDECTOMY, HX OF 12/13/2006  . Hypothyroidism 10/04/2006  . OBESITY 10/04/2006  . TOBACCO ABUSE 10/04/2006  . CARPAL TUNNEL SYNDROME 10/04/2006  . Essential hypertension 10/04/2006  . GERD 10/04/2006  . POSTMENOPAUSAL STATUS 10/04/2006  . SKIN TAG 10/04/2006  . KNEE PAIN, LEFT 10/04/2006  . Sleep apnea 10/04/2006  . LEG EDEMA 10/04/2006    Past Surgical History:  Procedure Laterality Date  . ABDOMINAL HYSTERECTOMY    . CARDIAC CATHETERIZATION  09/12/2017  . CORONARY STENT INTERVENTION  09/12/2017   STENT RESOLUTE ONYX 4.09W11 drug eluting stent was successfully placed  . CORONARY STENT INTERVENTION N/A 09/12/2017   Procedure: CORONARY STENT INTERVENTION;  Surgeon: Leonie Man, MD;  Location: [place] [facility];  Service: Cardiovascular;  Laterality: N/A;  . LEFT HEART CATH AND CORONARY ANGIOGRAPHY N/A 09/12/2017   Procedure: LEFT HEART CATH AND CORONARY ANGIOGRAPHY;  Surgeon: Leonie Man, MD;  Location: [place] [facility];  Service: Cardiovascular;  Laterality: N/A;  . MASS EXCISION N/A 12/18/2018   Procedure: EXCISION TONGUE MASS;  Surgeon: Leta Baptist, MD;  Location: [place] OR;  Service: ENT;  Laterality: N/A;  . RIGHT/LEFT HEART CATH AND CORONARY ANGIOGRAPHY N/A 09/10/2017   Procedure: RIGHT/LEFT HEART CATH AND CORONARY ANGIOGRAPHY;  Surgeon: Troy Sine,  MD;  Location: [place] [facility];  Service: Cardiovascular;  Laterality: N/A;  . THYROID SURGERY       OB History   No obstetric history on file.      Home Medications    Prior to Admission medications   Medication Sig Start Date End Date Taking? Authorizing Provider  amLODipine (NORVASC) 5 MG tablet TAKE 1 TABLET BY MOUTH EVERY DAY Patient taking differently: Take 5 mg by mouth daily.  11/12/18   Strader, Fransisco Hertz, PA-C  aspirin EC 81 MG tablet Take 81 mg by mouth daily.     [provider]  atorvastatin (LIPITOR) 80 MG tablet Take 1 tablet (80 mg total) by mouth every evening. 07/16/19   Arnoldo Lenis, MD  carvedilol (COREG) 25 MG tablet Take 1 tablet (25 mg total) by mouth 2 (two) times daily. 11/21/18 06/10/19  Arnoldo Lenis, MD  cetirizine (ZYRTEC) 10 MG tablet Take 10 mg by mouth as needed. 09/12/18   [provider]  clopidogrel (PLAVIX) 75 MG tablet TAKE 1 TABLET(75 MG) BY MOUTH DAILY WITH BREAKFAST 09/02/19   Arnoldo Lenis, MD  doxycycline (VIBRAMYCIN) 100 MG capsule Take 1 capsule (100 mg total) by mouth 2 (two) times daily. 09/06/19   Fredia Sorrow, MD  ENTRESTO 97-103 MG TAKE 1 TABLET BY MOUTH TWICE DAILY 09/02/19   Arnoldo Lenis, MD  furosemide (LASIX) 40 MG tablet Take 1 tablet (40 mg total) by mouth daily. 09/14/17   Ghimire, Henreitta Leber, MD  HYDROcodone-acetaminophen (NORCO/VICODIN) 5-325 MG tablet Take 1 tablet by mouth every 6 (six) hours as needed for moderate pain. 09/06/19   Fredia Sorrow, MD  insulin aspart (NOVOLOG) 100 UNIT/ML injection Inject 20 Units into the skin 3 (three) times daily before meals.     [provider]  insulin degludec (TRESIBA) 100 UNIT/ML SOPN FlexTouch Pen Inject 0.16 mLs (16 Units total) into the skin daily. Please include needles as well for injection Patient taking differently: Inject 50 Units into the skin daily. Please include needles as well for injection 07/18/18   Eulogio Bear U, DO  Insulin Pen Needle 32G X 4 MM MISC For use with lantus solostar-- change substitute if needed 07/18/18   Geradine Girt, DO  isosorbide mononitrate (IMDUR) 60 MG 24 hr tablet Take 1 tablet (60 mg total) by mouth daily. 08/20/18   Strader, Fransisco Hertz, PA-C  JARDIANCE 10 MG TABS tablet Take 1 tablet by mouth daily. 05/01/19   [provider]  levothyroxine (SYNTHROID, LEVOTHROID) 125 MCG tablet Take 125 mcg by mouth daily before breakfast.  08/16/18   [provider]  methocarbamol  (ROBAXIN) 500 MG tablet Take 1 tablet (500 mg total) by mouth 3 (three) times daily. 05/22/19   Triplett, Tammy, PA-C  ondansetron (ZOFRAN ODT) 4 MG disintegrating tablet Take 1 tablet (4 mg total) by mouth every 8 (eight) hours as needed. 09/06/19   Fredia Sorrow, MD  [facility] VERIO test strip USE TO TEST BLOOD SUGAR BID 01/05/18   [provider]  oxyCODONE-acetaminophen (PERCOCET/ROXICET) 5-325 MG tablet Take 2 tablets by mouth every 4 (four) hours as needed for severe pain. 05/22/19   Triplett, Tammy, PA-C  oxyCODONE-acetaminophen (PERCOCET/ROXICET) 5-325 MG tablet Take 1 tablet by mouth every 4 (four) hours as needed. 05/22/19   Triplett, Tammy, PA-C  spironolactone (ALDACTONE) 25 MG tablet TAKE 1 TABLET(25 MG) BY MOUTH DAILY 09/02/19   Arnoldo Lenis, MD  timolol (TIMOPTIC) 0.5 % ophthalmic solution Place 1 drop  into both eyes 2 (two) times daily. 10/07/18   [provider]  VENTOLIN HFA 108 (90 Base) MCG/ACT inhaler Inhale 1-2 puffs into the lungs every 6 (six) hours as needed for wheezing or shortness of breath. Patient taking differently: Inhale 1-2 puffs into the lungs as needed for wheezing or shortness of breath.  09/14/17   Ghimire, Henreitta Leber, MD    Family History Family History  Problem Relation Age of Onset  . Diabetes Mother   . Hypertension Mother   . Cancer Mother        pancreas  . Hypertension Sister     Social History Social History   Tobacco Use  . Smoking status: Former Smoker    Quit date: 12/14/2005    Years since quitting: 13.7  . Smokeless tobacco: Never Used  . Tobacco comment: QUIT IN 2005  Substance Use Topics  . Alcohol use: No  . Drug use: No     Allergies   Aspirin   Review of Systems Review of Systems  Constitutional: Negative for appetite change and fatigue.  HENT: Negative for congestion, ear discharge and sinus pressure.   Eyes: Negative for discharge.  Respiratory: Negative for cough.   Cardiovascular: Negative for  chest pain.  Gastrointestinal: Negative for abdominal pain and diarrhea.  Genitourinary: Negative for frequency and hematuria.  Musculoskeletal: Negative for back pain.  Skin: Negative for rash.  Neurological: Positive for weakness. Negative for seizures and headaches.  Psychiatric/Behavioral: Negative for hallucinations.  Patient complains of fever weakness.   Physical Exam Updated Vital Signs BP (!) 117/50   Pulse 83   Temp (!) 102.2 F (39 C) (Oral)   Resp (!) 23   Ht 5\' 5"  (1.651 m)   Wt (!) 152 kg   SpO2 94%   BMI 55.75 kg/m   Physical Exam Vitals signs and nursing note reviewed.  Constitutional:      Appearance: She is well-developed.  HENT:     Head: Normocephalic.     Nose: Nose normal.     Mouth/Throat:     Mouth: Mucous membranes are moist.  Eyes:     General: No scleral icterus.    Conjunctiva/sclera: Conjunctivae normal.  Neck:     Musculoskeletal: Neck supple.     Thyroid: No thyromegaly.  Cardiovascular:     Rate and Rhythm: Normal rate and regular rhythm.     Heart sounds: No murmur. No friction rub. No gallop.   Pulmonary:     Breath sounds: No stridor. No wheezing or rales.  Chest:     Chest wall: No tenderness.  Abdominal:     General: There is no distension.     Tenderness: There is no abdominal tenderness. There is no rebound.  Musculoskeletal: Normal range of motion.  Lymphadenopathy:     Cervical: No cervical adenopathy.  Skin:    Findings: No erythema or rash.  Neurological:     Mental Status: She is oriented to person, place, and time.     Motor: No abnormal muscle tone.     Coordination: Coordination normal.  Psychiatric:        Behavior: Behavior normal.      ED Treatments / Results  Labs (all labs ordered are listed, but only abnormal results are displayed) Labs Reviewed  COMPREHENSIVE METABOLIC PANEL - Abnormal; Notable for the following components:      Result Value   Sodium 133 (*)    Glucose, Bld 268 (*)    BUN 30  (*)  Creatinine, Ser 1.88 (*)    Calcium 7.3 (*)    Albumin 2.6 (*)    Alkaline Phosphatase 183 (*)    Total Bilirubin 1.7 (*)    GFR calc non Af Amer 28 (*)    GFR calc Af Amer 33 (*)    All other components within normal limits  CBC WITH DIFFERENTIAL/PLATELET - Abnormal; Notable for the following components:   WBC 21.0 (*)    RBC 3.53 (*)    Hemoglobin 9.7 (*)    HCT 31.0 (*)    Neutro Abs 16.8 (*)    Monocytes Absolute 1.5 (*)    Abs Immature Granulocytes 0.21 (*)    All other components within normal limits  PROTIME-INR - Abnormal; Notable for the following components:   Prothrombin Time 15.5 (*)    All other components within normal limits  URINALYSIS, ROUTINE W REFLEX MICROSCOPIC - Abnormal; Notable for the following components:   Color, Urine AMBER (*)    APPearance CLOUDY (*)    Hgb urine dipstick SMALL (*)    Protein, ur 100 (*)    Bacteria, UA FEW (*)    All other components within normal limits  CULTURE, BLOOD (ROUTINE X 2)  CULTURE, BLOOD (ROUTINE X 2)  URINE CULTURE  SARS CORONAVIRUS 2 (HOSPITAL ORDER, [place] [facility])  LACTIC ACID, PLASMA  LACTIC ACID, PLASMA  APTT    EKG None  Radiology Dg Chest Port 1 View  Result Date: 09/08/2019 CLINICAL DATA:  Fever EXAM: PORTABLE CHEST 1 VIEW COMPARISON:  July 16, 2018 FINDINGS: There is airspace consolidation in each lower lung zone. There is cardiomegaly with pulmonary venous hypertension. No adenopathy evident. No bone lesions. There is postoperative change in the cervicothoracic junction region. IMPRESSION: Lower lung zone consolidation bilaterally, likely multifocal pneumonia. Underlying pulmonary vascular congestion. No adenopathy evident. Electronically Signed   By: Lowella Grip III M.D.   On: 09/08/2019 17:08    Procedures Procedures (including critical care time)  Medications Ordered in ED Medications  sodium chloride 0.9 % bolus 2,000 mL ( Intravenous Canceled Entry 09/08/19  1751)  vancomycin (VANCOCIN) IVPB 1000 mg/200 mL premix (1,000 mg Intravenous New Bag/Given 09/08/19 1733)  piperacillin-tazobactam (ZOSYN) IVPB 3.375 g (0 g Intravenous Stopped 09/08/19 1655)  acetaminophen (TYLENOL) tablet 650 mg (650 mg Oral Given 09/08/19 1729)  sodium chloride 0.9 % bolus 500 mL (0 mLs Intravenous Stopped 09/08/19 1751)  ondansetron (ZOFRAN) injection 4 mg (4 mg Intravenous Given 09/08/19 1710)  sodium chloride 0.9 % bolus 500 mL (500 mLs Intravenous New Bag/Given 09/08/19 1735)     Initial Impression / Assessment and Plan / ED Course  I have reviewed the triage vital signs and the nursing notes.  Pertinent labs & imaging results that were available during my care of the patient were reviewed by me and considered in my medical decision making (see chart for details).    CRITICAL CARE Performed by: Milton Ferguson Total critical care time: 40 minutes Critical care time was exclusive of separately billable procedures and treating other patients. Critical care was necessary to treat or prevent imminent or life-threatening deterioration. Critical care was time spent personally by me on the following activities: development of treatment plan with patient and/or surrogate as well as nursing, discussions with consultants, evaluation of patient's response to treatment, examination of patient, obtaining history from patient or surrogate, ordering and performing treatments and interventions, ordering and review of laboratory studies, ordering and review of radiographic studies, pulse oximetry  and re-evaluation of patient's condition.     Patient with bilateral pneumonia.  She will be admitted to medicine  Final Clinical Impressions(s) / ED Diagnoses   Final diagnoses:  Community acquired pneumonia of left lower lobe of lung [facility])    ED Discharge Orders    None       Milton Ferguson, MD 09/08/19 1816

## 2019-09-08 NOTE — ED Notes (Signed)
ED TO INPATIENT HANDOFF REPORT  ED Nurse Name and Phone #: [phone]  S Name/Age/Gender Maureen Jackson 63 y.o. female Room/Bed: APA16A/APA16A  Code Status   Code Status: Prior  Home/SNF/Other Home Patient oriented to: situation Is this baseline? Yes   Triage Complete: Triage complete  Chief Complaint Weakness; Urinary Retention  Triage Note Pt presents to ED with complaints of generalized weakness since yesterday. Pt drowsy in triage. Pt also c/o unable to void. No void since yesterday.    Allergies Allergies  Allergen Reactions  . Aspirin Nausea Only    Level of Care/Admitting Diagnosis ED Disposition    ED Disposition Condition [facility] [[id number]]  Level of Care: Telemetry [5]  Covid Evaluation: Asymptomatic Screening Protocol (No Symptoms)  Diagnosis: CAP (community acquired pneumonia) [497026]  Admitting Physician: Bethena Roys [phone]  Attending Physician: Bethena Roys [phone]  Estimated length of stay: past midnight tomorrow  Certification:: I certify this patient will need inpatient services for at least 2 midnights  PT Class (Do Not Modify): Inpatient [101]  PT Acc Code (Do Not Modify): Private [1]       B Medical/Surgery History Past Medical History:  Diagnosis Date  . 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 Bleeding from mouth    when she brushed teeth or ate  . Chronic systolic CHF (congestive heart failure) ([place])   . Coronary artery disease 09/2017   a. multivessel CAD by cath in 09/2017 and not felt to be a CABG candidate --> underwent two-vessel PCI with DES to the LAD and DES to the RCA  . Diabetes mellitus   . Dyspnea   . Glaucoma   . Hypertension   . Left bundle branch block   . Morbid obesity ([place])   . Obstructive sleep apnea    does not wear CPAP  . Thyroid disease    Past Surgical History:  Procedure Laterality Date  . ABDOMINAL HYSTERECTOMY    . CARDIAC CATHETERIZATION   09/12/2017  . CORONARY STENT INTERVENTION  09/12/2017   STENT RESOLUTE ONYX 2.77A12 drug eluting stent was successfully placed  . CORONARY STENT INTERVENTION N/A 09/12/2017   Procedure: CORONARY STENT INTERVENTION;  Surgeon: Leonie Man, MD;  Location: [facility];  Service: Cardiovascular;  Laterality: N/A;  . LEFT HEART CATH AND CORONARY ANGIOGRAPHY N/A 09/12/2017   Procedure: LEFT HEART CATH AND CORONARY ANGIOGRAPHY;  Surgeon: Leonie Man, MD;  Location: [facility];  Service: Cardiovascular;  Laterality: N/A;  . MASS EXCISION N/A 12/18/2018   Procedure: EXCISION TONGUE MASS;  Surgeon: Leta Baptist, MD;  Location: [facility];  Service: ENT;  Laterality: N/A;  . RIGHT/LEFT HEART CATH AND CORONARY ANGIOGRAPHY N/A 09/10/2017   Procedure: RIGHT/LEFT HEART CATH AND CORONARY ANGIOGRAPHY;  Surgeon: Troy Sine, MD;  Location: [facility];  Service: Cardiovascular;  Laterality: N/A;  . THYROID SURGERY       A IV Location/Drains/Wounds Patient Lines/Drains/Airways Status   Active Line/Drains/Airways    Name:   Placement date:   Placement time:   Site:   Days:   Peripheral IV 09/08/19 Right Forearm   09/08/19    1625    Forearm   less than 1   Peripheral IV 09/08/19 Right Antecubital   09/08/19    1929    Antecubital   less than 1   External Urinary Catheter   09/08/19    1755    -  less than 1   Incision (Closed) 12/18/18 Lip Other (Comment)   12/18/18    1015     264          Intake/Output Last 24 hours  Intake/Output Summary (Last 24 hours) at 09/08/2019 2017 Last data filed at 09/08/2019 1751 Gross per 24 hour  Intake 550 ml  Output -  Net 550 ml    Labs/Imaging Results for orders placed or performed during the hospital encounter of 09/08/19 (from the past 48 hour(s))  Urinalysis, Routine w reflex microscopic     Status: Abnormal   Collection Time: 09/08/19  3:58 PM  Result Value Ref Range   Color, Urine AMBER (A) YELLOW    Comment: BIOCHEMICALS MAY BE  AFFECTED BY COLOR   APPearance CLOUDY (A) CLEAR   Specific Gravity, Urine 1.024 1.005 - 1.030   pH 5.0 5.0 - 8.0   Glucose, UA NEGATIVE NEGATIVE mg/dL   Hgb urine dipstick SMALL (A) NEGATIVE   Bilirubin Urine NEGATIVE NEGATIVE   Ketones, ur NEGATIVE NEGATIVE mg/dL   Protein, ur 100 (A) NEGATIVE mg/dL   Nitrite NEGATIVE NEGATIVE   Leukocytes,Ua NEGATIVE NEGATIVE   RBC / HPF 6-10 0 - 5 RBC/hpf   WBC, UA 6-10 0 - 5 WBC/hpf   Bacteria, UA FEW (A) NONE SEEN   Squamous Epithelial / LPF 0-5 0 - 5   Budding Yeast PRESENT    Granular Casts, UA PRESENT    Amorphous Crystal PRESENT     Comment: Performed at [facility], [address] [place], [place] [address]  SARS Coronavirus 2 [facility] order, Performed in [facility] hospital lab) Nasopharyngeal Nasopharyngeal Swab     Status: None   Collection Time: 09/08/19  3:59 PM   Specimen: Nasopharyngeal Swab  Result Value Ref Range   SARS Coronavirus 2 NEGATIVE NEGATIVE    Comment: (NOTE) If result is NEGATIVE SARS-CoV-2 target nucleic acids are NOT DETECTED. The SARS-CoV-2 RNA is generally detectable in upper and lower  respiratory specimens during the acute phase of infection. The lowest  concentration of SARS-CoV-2 viral copies this assay can detect is 250  copies / mL. A negative result does not preclude SARS-CoV-2 infection  and should not be used as the sole basis for treatment or other  patient management decisions.  A negative result may occur with  improper specimen collection / handling, submission of specimen other  than nasopharyngeal swab, presence of viral mutation(s) within the  areas targeted by this assay, and inadequate number of viral copies  (<250 copies / mL). A negative result must be combined with clinical  observations, patient history, and epidemiological information. If result is POSITIVE SARS-CoV-2 target nucleic acids are DETECTED. The SARS-CoV-2 RNA is generally detectable in upper and lower  respiratory  specimens dur ing the acute phase of infection.  Positive  results are indicative of active infection with SARS-CoV-2.  Clinical  correlation with patient history and other diagnostic information is  necessary to determine patient infection status.  Positive results do  not rule out bacterial infection or co-infection with other viruses. If result is PRESUMPTIVE POSTIVE SARS-CoV-2 nucleic acids MAY BE PRESENT.   A presumptive positive result was obtained on the submitted specimen  and confirmed on repeat testing.  While 2019 novel coronavirus  (SARS-CoV-2) nucleic acids may be present in the submitted sample  additional confirmatory testing may be necessary for epidemiological  and / or clinical management purposes  to differentiate between  SARS-CoV-2 and other Sarbecovirus currently  known to infect humans.  If clinically indicated additional testing with an alternate test  methodology [phone]) is advised. The SARS-CoV-2 RNA is generally  detectable in upper and lower respiratory sp ecimens during the acute  phase of infection. The expected result is Negative. Fact Sheet for Patients:  StrictlyIdeas.no Fact Sheet for Healthcare Providers: BankingDealers.co.za This test is not yet approved or cleared by the [place] FDA and has been authorized for detection and/or diagnosis of SARS-CoV-2 by FDA under an Emergency Use Authorization (EUA).  This EUA will remain in effect (meaning this test can be used) for the duration of the COVID-19 declaration under Section 564(b)(1) of the Act, 21 U.S.C. section 360bbb-3(b)(1), unless the authorization is terminated or revoked sooner. Performed at [facility], [address], [place], [place] [address]   Lactic acid, plasma     Status: None   Collection Time: 09/08/19  4:07 PM  Result Value Ref Range   Lactic Acid, Venous 1.9 0.5 - 1.9 mmol/L    Comment: Performed at [facility], [address], [place], [place] [address]  Comprehensive metabolic panel     Status: Abnormal   Collection Time: 09/08/19  4:07 PM  Result Value Ref Range   Sodium 133 (L) 135 - 145 mmol/L   Potassium 3.6 3.5 - 5.1 mmol/L   Chloride 100 98 - 111 mmol/L   CO2 23 22 - 32 mmol/L   Glucose, Bld 268 (H) 70 - 99 mg/dL   BUN 30 (H) 8 - 23 mg/dL   Creatinine, Ser 1.88 (H) 0.44 - 1.00 mg/dL   Calcium 7.3 (L) 8.9 - 10.3 mg/dL   Total Protein 7.2 6.5 - 8.1 g/dL   Albumin 2.6 (L) 3.5 - 5.0 g/dL   AST 33 15 - 41 U/L   ALT 32 0 - 44 U/L   Alkaline Phosphatase 183 (H) 38 - 126 U/L   Total Bilirubin 1.7 (H) 0.3 - 1.2 mg/dL   GFR calc non Af Amer 28 (L) >60 mL/min   GFR calc Af Amer 33 (L) >60 mL/min   Anion gap 10 5 - 15    Comment: Performed at [facility], [address], [place], [place] [address]  CBC WITH DIFFERENTIAL     Status: Abnormal   Collection Time: 09/08/19  4:07 PM  Result Value Ref Range   WBC 21.0 (H) 4.0 - 10.5 K/uL   RBC 3.53 (L) 3.87 - 5.11 MIL/uL   Hemoglobin 9.7 (L) 12.0 - 15.0 g/dL   HCT 31.0 (L) 36.0 - 46.0 %   MCV 87.8 80.0 - 100.0 fL   MCH 27.5 26.0 - 34.0 pg   MCHC 31.3 30.0 - 36.0 g/dL   RDW 13.3 11.5 - 15.5 %   Platelets 222 150 - 400 K/uL   nRBC 0.0 0.0 - 0.2 %   Neutrophils Relative % 81 %   Neutro Abs 16.8 (H) 1.7 - 7.7 K/uL   Lymphocytes Relative 11 %   Lymphs Abs 2.4 0.7 - 4.0 K/uL   Monocytes Relative 7 %   Monocytes Absolute 1.5 (H) 0.1 - 1.0 K/uL   Eosinophils Relative 0 %   Eosinophils Absolute 0.1 0.0 - 0.5 K/uL   Basophils Relative 0 %   Basophils Absolute 0.0 0.0 - 0.1 K/uL   WBC Morphology DOHLE BODIES     Comment: MILD LEFT SHIFT (1-5% METAS, OCC MYELO, OCC BANDS)   Immature Granulocytes 1 %   Abs Immature Granulocytes 0.21 (H) 0.00 - 0.07  K/uL    Comment: Performed at [facility], [address] [place], [place] [address]  APTT     Status: None   Collection Time: 09/08/19  4:07 PM  Result Value Ref Range   aPTT 30 24 - 36 seconds    Comment:  Performed at [facility], [address], [place], [place] [address]  Protime-INR     Status: Abnormal   Collection Time: 09/08/19  4:07 PM  Result Value Ref Range   Prothrombin Time 15.5 (H) 11.4 - 15.2 seconds   INR 1.2 0.8 - 1.2    Comment: (NOTE) INR goal varies based on device and disease states. Performed at [facility], [address] [place], [place] [address]   Lactic acid, plasma     Status: None   Collection Time: 09/08/19  5:25 PM  Result Value Ref Range   Lactic Acid, Venous 1.5 0.5 - 1.9 mmol/L    Comment: Performed at [facility], [address], [place], [place] [address]   Dg Chest Port 1 View  Result Date: 09/08/2019 CLINICAL DATA:  Fever EXAM: PORTABLE CHEST 1 VIEW COMPARISON:  July 16, 2018 FINDINGS: There is airspace consolidation in each lower lung zone. There is cardiomegaly with pulmonary venous hypertension. No adenopathy evident. No bone lesions. There is postoperative change in the cervicothoracic junction region. IMPRESSION: Lower lung zone consolidation bilaterally, likely multifocal pneumonia. Underlying pulmonary vascular congestion. No adenopathy evident. Electronically Signed   By: Lowella Grip III M.D.   On: 09/08/2019 17:08    Pending Labs Unresulted Labs (From admission, onward)    Start     Ordered   09/08/19 1558  Blood Culture (routine x 2)  BLOOD CULTURE X 2,   STAT     09/08/19 1557   09/08/19 1558  Urine culture  ONCE - STAT,   STAT     09/08/19 1557          Vitals/Pain Today's Vitals   09/08/19 1730 09/08/19 1800 09/08/19 1916 09/08/19 1931  BP:  (!) 117/38 (!) 96/46   Pulse: 83 85    Resp: (!) 23 (!) 25 16   Temp:    98.9 F (37.2 C)  TempSrc:      SpO2: 94% 92%    Weight:      Height:      PainSc:        Isolation Precautions No active isolations  Medications Medications  sodium chloride 0.9 % bolus 2,000 mL (2,000 mLs Intravenous Not Given 09/08/19 2011)  vancomycin (VANCOCIN) IVPB 1000 mg/200 mL premix (0 mg  Intravenous Stopped 09/08/19 1929)  piperacillin-tazobactam (ZOSYN) IVPB 3.375 g (0 g Intravenous Stopped 09/08/19 1655)  acetaminophen (TYLENOL) tablet 650 mg (650 mg Oral Given 09/08/19 1729)  sodium chloride 0.9 % bolus 500 mL (0 mLs Intravenous Stopped 09/08/19 1751)  ondansetron (ZOFRAN) injection 4 mg (4 mg Intravenous Given 09/08/19 1710)  sodium chloride 0.9 % bolus 500 mL (0 mLs Intravenous Stopped 09/08/19 2011)    Mobility walks with device Low fall risk   Focused Assessments Pulmonary Assessment Handoff:  Lung sounds: Bilateral Breath Sounds: Diminished, Expiratory wheezes, Inspiratory wheezes L Breath Sounds: Diminished, Expiratory wheezes, Inspiratory wheezes R Breath Sounds: Diminished, Expiratory wheezes, Inspiratory wheezes O2 Device: Room Air        R Recommendations: See Admitting Provider Note  Report given to:   Additional Notes: Pt has external catheter.

## 2019-09-08 NOTE — Progress Notes (Signed)
Code sepsis was called on this patient and 3 hour bundle complete with normal Vital signs and normal lactic acid. Patient has received broad spectrum antibiotics. Called Rn in the ED to verify that the patient looked stable enough for a tele bed and to be sure that the ER MD was aware and assessed the labial abscess that the patient was seen for on 9/26

## 2019-09-08 NOTE — ED Triage Notes (Addendum)
Pt presents to ED with complaints of generalized weakness since yesterday. Pt drowsy in triage. Pt also c/o unable to void. No void since yesterday.

## 2019-09-08 NOTE — Progress Notes (Signed)
Pharmacy Antibiotic Note  Maureen Jackson is a 63 y.o. female admitted on 09/08/2019 with groin abscess with sepsis.  Pharmacy has been consulted for vancomycin dosing. Vancomycin 1000 mg given @1733   Plan: Vancomycin 1500 mg IV x 1 dose to be given in additon to the 1 gram to complete a loading dose of 2500 mg. AP pharmacist to follow up additional dosing based on renal function. Monitor clinical progress, cultures/sensitivities, renal function, abx plan, Vancomycin levels as indicated    Height: 5\' 5"  (165.1 cm) Weight: (!) 335 lb (152 kg) IBW/kg (Calculated) : 57  Temp (24hrs), Avg:101.2 F (38.4 C), Min:98.9 F (37.2 C), Max:102.4 F (39.1 C)  Recent Labs  Lab 09/08/19 1607 09/08/19 1725  WBC 21.0*  --   CREATININE 1.88*  --   LATICACIDVEN 1.9 1.5    Estimated Creatinine Clearance: 47.1 mL/min (A) (by C-G formula based on SCr of 1.88 mg/dL (H)).    Allergies  Allergen Reactions  . Aspirin Nausea Only    Thank you for allowing pharmacy to be a part of this patient's care.  Jens Som, PharmD 09/08/2019 11:23 PM

## 2019-09-09 ENCOUNTER — Inpatient Hospital Stay (HOSPITAL_COMMUNITY): Payer: Medicare HMO

## 2019-09-09 ENCOUNTER — Other Ambulatory Visit: Payer: Self-pay

## 2019-09-09 LAB — GLUCOSE, CAPILLARY
Glucose-Capillary: 231 mg/dL — ABNORMAL HIGH (ref 70–99)
Glucose-Capillary: 202 mg/dL — ABNORMAL HIGH (ref 70–99)
Glucose-Capillary: 189 mg/dL — ABNORMAL HIGH (ref 70–99)
Glucose-Capillary: 179 mg/dL — ABNORMAL HIGH (ref 70–99)

## 2019-09-09 LAB — HEMOGLOBIN A1C
Hgb A1c MFr Bld: 10.2 % — ABNORMAL HIGH (ref 4.8–5.6)
Mean Plasma Glucose: 246.04 mg/dL

## 2019-09-09 MED ORDER — ENSURE ENLIVE PO LIQD
237.0000 mL | Freq: Two times a day (BID) | ORAL | Status: DC
  Administered 2019-09-10 – 2019-09-12 (×3): 237 mL via ORAL

## 2019-09-09 MED ORDER — VANCOMYCIN HCL 10 G IV SOLR
1750.0000 mg | INTRAVENOUS | Status: DC
  Filled 2019-09-09: qty 1750

## 2019-09-09 MED ORDER — ADULT MULTIVITAMIN W/MINERALS CH
1.0000 | ORAL_TABLET | Freq: Every day | ORAL | Status: DC
  Administered 2019-09-10 – 2019-09-17 (×5): 1 via ORAL
  Filled 2019-09-09 (×6): qty 1

## 2019-09-09 MED ORDER — VANCOMYCIN HCL IN DEXTROSE 750-5 MG/150ML-% IV SOLN: 750.0000 mg | Freq: Once | INTRAVENOUS | Status: DC

## 2019-09-09 MED ORDER — OXYCODONE HCL 5 MG PO TABS
10.0000 mg | ORAL_TABLET | Freq: Once | ORAL | Status: AC
  Administered 2019-09-09: 10 mg via ORAL
  Filled 2019-09-09: qty 2

## 2019-09-09 MED ORDER — OXYCODONE HCL 5 MG PO TABS
10.0000 mg | ORAL_TABLET | Freq: Four times a day (QID) | ORAL | Status: DC | PRN
  Administered 2019-09-10 – 2019-09-11 (×2): 10 mg via ORAL
  Filled 2019-09-09 (×4): qty 2

## 2019-09-09 MED ORDER — ACETAMINOPHEN 325 MG PO TABS
650.0000 mg | ORAL_TABLET | Freq: Four times a day (QID) | ORAL | Status: DC | PRN
  Administered 2019-09-09 – 2019-09-26 (×7): 650 mg via ORAL
  Filled 2019-09-09 (×9): qty 2

## 2019-09-09 MED ORDER — VANCOMYCIN HCL 10 G IV SOLR
1750.0000 mg | INTRAVENOUS | Status: DC
  Administered 2019-09-10: 1750 mg via INTRAVENOUS
  Filled 2019-09-09 (×2): qty 1750

## 2019-09-09 MED ORDER — HEPARIN SODIUM (PORCINE) 5000 UNIT/ML IJ SOLN
5000.0000 [IU] | Freq: Three times a day (TID) | INTRAMUSCULAR | Status: DC
  Administered 2019-09-09 – 2019-10-02 (×64): 5000 [IU] via SUBCUTANEOUS
  Filled 2019-09-09 (×65): qty 1

## 2019-09-09 MED ORDER — MORPHINE SULFATE (PF) 2 MG/ML IV SOLN
2.0000 mg | INTRAVENOUS | Status: DC | PRN
  Administered 2019-09-09 (×2): 2 mg via INTRAVENOUS
  Administered 2019-09-10 (×2): 4 mg via INTRAVENOUS
  Filled 2019-09-09 (×2): qty 2
  Filled 2019-09-09 (×2): qty 1
  Filled 2019-09-09: qty 2
  Filled 2019-09-09: qty 1

## 2019-09-09 MED ORDER — VANCOMYCIN HCL IN DEXTROSE 1-5 GM/200ML-% IV SOLN: 1000.0000 mg | Freq: Once | INTRAVENOUS | Status: DC

## 2019-09-09 NOTE — Progress Notes (Signed)
Pharmacy Antibiotic Note  Maureen Jackson is a 63 y.o. female admitted on 09/08/2019 with CAP and groin abscess with sepsis.   Pharmacy has been consulted for Vancomycin dosing.  Plan: Vancomycin 2500 mg IV x 1 dose Vancomycin 1750 mg IV every 24 hours.  Goal trough 15-20 mcg/mL.  Ceftriaxone 2000 mg IV every 24 hours. Azithromycin 500 mg IV every 24 hours. Monitor labs, c/s, and vanco level as indicated.  Height: 5\' 5"  (165.1 cm) Weight: (!) 335 lb (152 kg) IBW/kg (Calculated) : 57  Temp (24hrs), Avg:102 F (38.9 C), Min:98.9 F (37.2 C), Max:103.1 F (39.5 C)  Recent Labs  Lab 09/08/19 1607 09/08/19 1725  WBC 21.0*  --   CREATININE 1.88*  --   LATICACIDVEN 1.9 1.5    Estimated Creatinine Clearance: 47.1 mL/min (A) (by C-G formula based on SCr of 1.88 mg/dL (H)).    Allergies  Allergen Reactions  . Aspirin Nausea Only    Antimicrobials this admission: Azith 9-29 >>  CTX 9/29 >>  Vanco 9/29 >>  Dose adjustments this admission: [place]  Microbiology results: 9/29 BCx: ngtd 9/28 UCx: pending    Thank you for allowing pharmacy to be a part of this patient's care.  Ramond Craver 09/09/2019 9:07 AM

## 2019-09-09 NOTE — Progress Notes (Signed)
Patient Demographics:    Maureen Jackson, is a 63 y.o. female, DOB - 08-13-1956, UJW:[id number]  Admit date - 09/08/2019   Admitting Physician Ejiroghene Arlyce Dice, MD  Outpatient Primary MD for the patient is Neale Burly, MD  LOS - 1   Chief Complaint  Patient presents with  . Weakness        Subjective:    Maureen Jackson today has no fevers, no emesis,  No chest pain, patient continues with some cough and some dyspnea  Assessment  & Plan :    Active Problems:   CAP (community acquired pneumonia)  Brief summary  63 y.o. female with medical history significant for morbid obesity, diastolic CHF, hypothyroidism, hypertension, obesity, OSA, coronary artery disease, prior TIA admitted on 09/08/2019 with sepsis secondary to pneumonia and left groin abscess   A/p 1)Sepsis secondary to pneumonia and Lt groin abscess--patient with persistent fevers --T-max 103.1 (T-current is 102.5), significant leukocytosis noted with white count of  21K, respiratory rate up to 26, hypoxia with O2 sats around 88% on room air as well as AKI with creatinine up to 1.88 from a baseline of 0.9-- -lactic acid is not elevated -Continue IV azithromycin, Rocephin and vancomycin  2)Pneumonia----cough and shortness of breath persist, -Fevers resolving -WBC was 21K - continue azithromycin Rocephin along with bronchodilators and mucolytics  3)Lt labia/perineal abscess--- continue IV vancomycin, surgical consult requested  4)AKI----acute kidney injury due to sepsis secondary to pneumonia and groin abscess,  creatinine on admission= 1.88 ,   baseline creatinine =  0.9  ,renally adjust medications, avoid nephrotoxic agents / dehydration / hypotension  5)DM2- A1C was 10.2 , reflecting uncontrolled diabetes- -Lantus insulin 25 units nightly Use Novolog/Humalog Sliding scale insulin with Accu-Cheks/Fingersticks as ordered   6)  Morbid obesity and OSA--- continue CPAP nightly  7)h/o CAD--prior angioplasty and stenting, cardiology previously recommended lifelong antiplatelet therapy-stable, continue aspirin and Plavix, continue Lipitor,  8)HFpEF--history of combined diastolic and systolic dysfunction CHF----- Coreg, amlodipine Lasix and Aldactone on hold due to soft BP   Disposition/Need for in-Hospital Stay- patient unable to be discharged at this time due to sepsis secondary to pneumonia and groin abscess requiring IV antibiotics*  Code Status : Full   Family Communication:   NA (patient is alert, awake and coherent)   Disposition Plan  : TBD  Consults  :  gen surgery  DVT Prophylaxis  :   - Heparin - SCDs   Lab Results  Component Value Date   PLT 222 09/08/2019    Inpatient Medications  Scheduled Meds: . aspirin EC  81 mg Oral Daily  . atorvastatin  80 mg Oral QPM  . clopidogrel  75 mg Oral q morning - 10a  . guaiFENesin-dextromethorphan  10 mL Oral Q6H  . insulin aspart  0-15 Units Subcutaneous TID WC  . insulin aspart  0-5 Units Subcutaneous QHS  . insulin glargine  25 Units Subcutaneous QHS  . levothyroxine  125 mcg Oral Q0600  . nystatin   Topical TID   Continuous Infusions: . azithromycin 500 mg (09/09/19 0144)  . cefTRIAXone (ROCEPHIN)  IV Stopped (09/09/19 0141)  . sodium chloride     PRN Meds:.acetaminophen, ipratropium-albuterol, morphine injection    Anti-infectives (From admission, onward)  Start     Dose/Rate Route Frequency Ordered Stop   09/08/19 2300  azithromycin (ZITHROMAX) 500 mg in sodium chloride 0.9 % 250 mL IVPB     500 mg 250 mL/hr over 60 Minutes Intravenous Every 24 hours 09/08/19 2230     09/08/19 2300  cefTRIAXone (ROCEPHIN) 2 g in sodium chloride 0.9 % 100 mL IVPB     2 g 200 mL/hr over 30 Minutes Intravenous Every 24 hours 09/08/19 2230     09/08/19 2300  vancomycin (VANCOCIN) 1,500 mg in sodium chloride 0.9 % 500 mL IVPB     1,500 mg 250 mL/hr over 120  Minutes Intravenous  Once 09/08/19 2254 09/09/19 0434   09/08/19 1600  vancomycin (VANCOCIN) IVPB 1000 mg/200 mL premix     1,000 mg 200 mL/hr over 60 Minutes Intravenous  Once 09/08/19 1558 09/08/19 1929   09/08/19 1600  piperacillin-tazobactam (ZOSYN) IVPB 3.375 g     3.375 g 100 mL/hr over 30 Minutes Intravenous  Once 09/08/19 1558 09/08/19 1655        Objective:   Vitals:   09/09/19 0337 09/09/19 0441 09/09/19 0540 09/09/19 0628  BP: (!) 92/48 (!) 156/76 (!) 134/113 (!) 105/50  Pulse: 86 97 (!) 171 83  Resp: (!) 22 (!) 22 (!) 22   Temp: (!) 102.9 F (39.4 C) (!) 102.1 F (38.9 C) (!) 103.1 F (39.5 C) (!) 102.5 F (39.2 C)  TempSrc: Oral Oral Oral Oral  SpO2:   97% (!) 88%  Weight:      Height:       Temp:  [98.9 F (37.2 C)-103.1 F (39.5 C)] 102.5 F (39.2 C) (09/29 0628) Pulse Rate:  [83-171] 83 (09/29 0628) Resp:  [16-26] 22 (09/29 0540) BP: (92-156)/(38-113) 105/50 (09/29 0628) SpO2:  [88 %-98 %] 88 % (09/29 0628) Weight:  [152 kg] 152 kg (09/28 1517)   Wt Readings from Last 3 Encounters:  09/08/19 (!) 152 kg  09/06/19 (!) 152 kg  06/10/19 (!) 155.7 kg    Intake/Output Summary (Last 24 hours) at 09/09/2019 0826 Last data filed at 09/09/2019 0109 Gross per 24 hour  Intake 654.33 ml  Output 1 ml  Net 653.33 ml     Physical Exam  Gen:- Awake Alert, morbidly obese HEENT:- [place].AT, No sclera icterus Neck-Supple Neck,No JVD,.  Lungs-diminished in bases, no wheezing CV- S1, S2 normal, regular  Abd-  +ve B.Sounds, Abd Soft, No tenderness, increased truncal adiposity Extremity:-  pedal pulses present  Psych-affect is appropriate, oriented x3 Neuro-no new focal deficits, no tremors SKIN--- intertrigo in skin folds,  -left lower pubic/vulva area-with purulent drainage swelling erythema and tenderness   Data Review:   Micro Results Recent Results (from the past 240 hour(s))  SARS Coronavirus 2 [facility] order, Performed in [facility] hospital lab)  Nasopharyngeal Nasopharyngeal Swab     Status: None   Collection Time: 09/08/19  3:59 PM   Specimen: Nasopharyngeal Swab  Result Value Ref Range Status   SARS Coronavirus 2 NEGATIVE NEGATIVE Final    Comment: (NOTE) If result is NEGATIVE SARS-CoV-2 target nucleic acids are NOT DETECTED. The SARS-CoV-2 RNA is generally detectable in upper and lower  respiratory specimens during the acute phase of infection. The lowest  concentration of SARS-CoV-2 viral copies this assay can detect is 250  copies / mL. A negative result does not preclude SARS-CoV-2 infection  and should not be used as the sole basis for treatment or other  patient management decisions.  A negative  result may occur with  improper specimen collection / handling, submission of specimen other  than nasopharyngeal swab, presence of viral mutation(s) within the  areas targeted by this assay, and inadequate number of viral copies  (<250 copies / mL). A negative result must be combined with clinical  observations, patient history, and epidemiological information. If result is POSITIVE SARS-CoV-2 target nucleic acids are DETECTED. The SARS-CoV-2 RNA is generally detectable in upper and lower  respiratory specimens dur ing the acute phase of infection.  Positive  results are indicative of active infection with SARS-CoV-2.  Clinical  correlation with patient history and other diagnostic information is  necessary to determine patient infection status.  Positive results do  not rule out bacterial infection or co-infection with other viruses. If result is PRESUMPTIVE POSTIVE SARS-CoV-2 nucleic acids MAY BE PRESENT.   A presumptive positive result was obtained on the submitted specimen  and confirmed on repeat testing.  While 2019 novel coronavirus  (SARS-CoV-2) nucleic acids may be present in the submitted sample  additional confirmatory testing may be necessary for epidemiological  and / or clinical management purposes  to  differentiate between  SARS-CoV-2 and other Sarbecovirus currently known to infect humans.  If clinically indicated additional testing with an alternate test  methodology [phone]) is advised. The SARS-CoV-2 RNA is generally  detectable in upper and lower respiratory sp ecimens during the acute  phase of infection. The expected result is Negative. Fact Sheet for Patients:  StrictlyIdeas.no Fact Sheet for Healthcare Providers: BankingDealers.co.za This test is not yet approved or cleared by the [place] FDA and has been authorized for detection and/or diagnosis of SARS-CoV-2 by FDA under an Emergency Use Authorization (EUA).  This EUA will remain in effect (meaning this test can be used) for the duration of the COVID-19 declaration under Section 564(b)(1) of the Act, 21 U.S.C. section 360bbb-3(b)(1), unless the authorization is terminated or revoked sooner. Performed at [facility], [address], [place], [place] [address]   Blood Culture (routine x 2)     Status: None (Preliminary result)   Collection Time: 09/08/19  4:07 PM   Specimen: BLOOD RIGHT WRIST  Result Value Ref Range Status   Specimen Description BLOOD RIGHT WRIST  Final   Special Requests   Final    BOTTLES DRAWN AEROBIC AND ANAEROBIC Blood Culture adequate volume   Culture   Final    NO GROWTH < 24 HOURS Performed at [facility], [address], [place], [place] [address]    Report Status PENDING  Incomplete  Blood Culture (routine x 2)     Status: None (Preliminary result)   Collection Time: 09/08/19  4:17 PM   Specimen: BLOOD LEFT HAND  Result Value Ref Range Status   Specimen Description BLOOD LEFT HAND  Final   Special Requests   Final    BOTTLES DRAWN AEROBIC AND ANAEROBIC Blood Culture adequate volume   Culture   Final    NO GROWTH < 24 HOURS Performed at [facility], [address] [place], [place] [address]    Report Status PENDING  Incomplete     Radiology Reports Dg Chest Port 1 View  Result Date: 09/08/2019 CLINICAL DATA:  Fever EXAM: PORTABLE CHEST 1 VIEW COMPARISON:  July 16, 2018 FINDINGS: There is airspace consolidation in each lower lung zone. There is cardiomegaly with pulmonary venous hypertension. No adenopathy evident. No bone lesions. There is postoperative change in the cervicothoracic junction region. IMPRESSION: Lower lung zone consolidation bilaterally, likely multifocal  pneumonia. Underlying pulmonary vascular congestion. No adenopathy evident. Electronically Signed   By: Lowella Grip III M.D.   On: 09/08/2019 17:08     CBC Recent Labs  Lab 09/08/19 1607  WBC 21.0*  HGB 9.7*  HCT 31.0*  PLT 222  MCV 87.8  MCH 27.5  MCHC 31.3  RDW 13.3  LYMPHSABS 2.4  MONOABS 1.5*  EOSABS 0.1  BASOSABS 0.0    Chemistries  Recent Labs  Lab 09/08/19 1607  NA 133*  K 3.6  CL 100  CO2 23  GLUCOSE 268*  BUN 30*  CREATININE 1.88*  CALCIUM 7.3*  AST 33  ALT 32  ALKPHOS 183*  BILITOT 1.7*   ------------------------------------------------------------------------------------------------------------------ No results for input(s): CHOL, HDL, LDLCALC, TRIG, CHOLHDL, LDLDIRECT in the last 72 hours.  Lab Results  Component Value Date   HGBA1C 11.2 (H) 12/12/2018   ------------------------------------------------------------------------------------------------------------------ No results for input(s): TSH, T4TOTAL, T3FREE, THYROIDAB in the last 72 hours.  Invalid input(s): FREET3 ------------------------------------------------------------------------------------------------------------------ No results for input(s): VITAMINB12, FOLATE, FERRITIN, TIBC, IRON, RETICCTPCT in the last 72 hours.  Coagulation profile Recent Labs  Lab 09/08/19 1607  INR 1.2    No results for input(s): DDIMER in the last 72 hours.  Cardiac Enzymes No results for input(s): CKMB, TROPONINI, MYOGLOBIN in the last 168 hours.   Invalid input(s): CK ------------------------------------------------------------------------------------------------------------------    Component Value Date/Time   BNP 50.2 07/16/2018 2048     Roxan Hockey M.D on 09/09/2019 at 8:26 AM  Go to [email or web address] - for contact info  [facility] - Office  [phone]

## 2019-09-09 NOTE — Progress Notes (Signed)
Patient refusing CPAP at this time. She says she does not wear at home and she will not wear one here. RT will continue to monitor.

## 2019-09-09 NOTE — Progress Notes (Signed)
Initial Nutrition Assessment  DOCUMENTATION CODES:   Morbid obesity  INTERVENTION:  -Ensure Enlive po BID, each supplement provides 350 kcal and 20 grams of protein -MVI with minerals daily  NUTRITION DIAGNOSIS:   Inadequate oral intake related to acute illness(pneumonia; abscess in left labia/upper thigh area) as evidenced by meal completion < 25%.  GOAL:   Patient will meet greater than or equal to 90% of their needs   MONITOR:   PO intake, Labs, I & O's, Weight trends, Supplement acceptance, Skin  REASON FOR ASSESSMENT:   Malnutrition Screening Tool    ASSESSMENT:  63 year old female with medical history significant for morbid obesity, CHF, hypothyroidism, HTN, OSA, CAD, T2DM, and arthritis who presented to ED with complaints of generalized weakness, severe dry cough, difficulty voiding, poor appetite, and difficulty sleeping for the past 3 days. Patient seen in ED 9/26 for abscess in left labia/upper thigh area that has been draining large amounts of pus; patient discharged on doxycyline and pain medication. Portable CXR showed multifocal pneumonia underlying pulmonary vascular congestion  Patient admitted for pneumonia with sepsis  Unable to obtain nutrition history at this time; abdominal ultra sound at time of visit. Per chart review, patient provided multiple handouts to assist with weight loss and promote improved overall dietary quality in April of 2019. Patient received Heart Failure nutrition education in August of 2019. Patient with 0% of last 2 meals since [facility] diet advancement. Will continue to monitor for oral intake as well as order Ensure to assist with calorie and protein needs.  I/Os: +653 ml since admit Current wt 152 kg (334 lb) UBW: 147.1 kg  - 155.7 kg over the past year  Medications reviewed and include: SSI, lantus, zithromax, rocephin  Labs: CBGS 189-231 9/28 Na 133 9/28 WBC 21  NUTRITION - FOCUSED PHYSICAL EXAM: Unable to complete at this  time   Diet Order:   Diet Order            Diet Heart Room service appropriate? Yes; Fluid consistency: Thin  Diet effective now              EDUCATION NEEDS:   Not appropriate for education at this time  Skin:  Skin Assessment: Reviewed RN Assessment(abscess; left;pelvis)  Last BM:  9/23  Height:   Ht Readings from Last 1 Encounters:  09/08/19 5\' 5"  (1.651 m)    Weight:   Wt Readings from Last 1 Encounters:  09/08/19 (!) 152 kg    Ideal Body Weight:  56.8 kg  BMI:  Body mass index is 55.75 kg/m.  Estimated Nutritional Needs:   Kcal:  1900-2100  Protein:  85-97  Fluid:  >/= 1.9 L/day   Lajuan Lines, RD, LDN [facility] Telephone [phone] After Hours/Weekend Pager: [phone]

## 2019-09-10 ENCOUNTER — Inpatient Hospital Stay (HOSPITAL_COMMUNITY): Payer: Medicare HMO

## 2019-09-10 DIAGNOSIS — N7689 Other specified inflammation of vagina and vulva: Secondary | ICD-10-CM | POA: Diagnosis present

## 2019-09-10 DIAGNOSIS — N7682 Fournier disease of vagina and vulva: Secondary | ICD-10-CM | POA: Diagnosis present

## 2019-09-10 DIAGNOSIS — N764 Abscess of vulva: Secondary | ICD-10-CM

## 2019-09-10 LAB — URINE CULTURE: Culture: >=100000 — AB

## 2019-09-10 LAB — COMPREHENSIVE METABOLIC PANEL
ALT: 32 U/L (ref 0–44)
AST: 52 U/L — ABNORMAL HIGH (ref 15–41)
Albumin: 2.1 g/dL — ABNORMAL LOW (ref 3.5–5.0)
Alkaline Phosphatase: 164 U/L — ABNORMAL HIGH (ref 38–126)
Anion gap: 13 (ref 5–15)
BUN: 53 mg/dL — ABNORMAL HIGH (ref 8–23)
CO2: 22 mmol/L (ref 22–32)
Calcium: 7 mg/dL — ABNORMAL LOW (ref 8.9–10.3)
Chloride: 100 mmol/L (ref 98–111)
Creatinine, Ser: 4.13 mg/dL — ABNORMAL HIGH (ref 0.44–1.00)
GFR calc Af Amer: 13 mL/min — ABNORMAL LOW (ref >60)
GFR calc non Af Amer: 11 mL/min — ABNORMAL LOW (ref >60)
Glucose, Bld: 170 mg/dL — ABNORMAL HIGH (ref 70–99)
Potassium: 4 mmol/L (ref 3.5–5.1)
Sodium: 135 mmol/L (ref 135–145)
Total Bilirubin: 1 mg/dL (ref 0.3–1.2)
Total Protein: 6.7 g/dL (ref 6.5–8.1)

## 2019-09-10 LAB — GLUCOSE, CAPILLARY
Glucose-Capillary: 148 mg/dL — ABNORMAL HIGH (ref 70–99)
Glucose-Capillary: 144 mg/dL — ABNORMAL HIGH (ref 70–99)
Glucose-Capillary: 140 mg/dL — ABNORMAL HIGH (ref 70–99)
Glucose-Capillary: 141 mg/dL — ABNORMAL HIGH (ref 70–99)

## 2019-09-10 LAB — CBC
HCT: 30.8 % — ABNORMAL LOW (ref 36.0–46.0)
Hemoglobin: 9.4 g/dL — ABNORMAL LOW (ref 12.0–15.0)
MCH: 27.3 pg (ref 26.0–34.0)
MCHC: 30.5 g/dL (ref 30.0–36.0)
MCV: 89.5 fL (ref 80.0–100.0)
Platelets: 211 10*3/uL (ref 150–400)
RBC: 3.44 MIL/uL — ABNORMAL LOW (ref 3.87–5.11)
RDW: 13.8 % (ref 11.5–15.5)
WBC: 17.6 10*3/uL — ABNORMAL HIGH (ref 4.0–10.5)
nRBC: 0 % (ref 0.0–0.2)

## 2019-09-10 LAB — VANCOMYCIN, RANDOM: Vancomycin Rm: 43

## 2019-09-10 LAB — CK: Total CK: 732 U/L — ABNORMAL HIGH (ref 38–234)

## 2019-09-10 MED ORDER — VANCOMYCIN VARIABLE DOSE PER UNSTABLE RENAL FUNCTION (PHARMACIST DOSING): Status: DC

## 2019-09-10 MED ORDER — CLINDAMYCIN PHOSPHATE 600 MG/50ML IV SOLN
600.0000 mg | Freq: Four times a day (QID) | INTRAVENOUS | Status: DC
  Administered 2019-09-10 – 2019-09-12 (×7): 600 mg via INTRAVENOUS
  Filled 2019-09-10 (×8): qty 50

## 2019-09-10 NOTE — Progress Notes (Signed)
Pt doesn't use CPAP at home and doesn't want to use here.  RT will continue to monitor.

## 2019-09-10 NOTE — Care Management Important Message (Signed)
Important Message  Patient Details  Name: Maureen Jackson MRN: [id number] Date of Birth: Jun 16, 1956   Medicare Important Message Given:  Yes     Tommy Medal 09/10/2019, 3:27 PM

## 2019-09-10 NOTE — Progress Notes (Addendum)
Patient Demographics:    Maureen Jackson, is a 63 y.o. female, DOB - 1956/04/10, FSE:[id number]  Admit date - 09/08/2019   Admitting Physician Ejiroghene Arlyce Dice, MD  Outpatient Primary MD for the patient is Neale Burly, MD  LOS - 2   Chief Complaint  Patient presents with   Weakness        Subjective:    Maureen Jackson today has no fevers, no emesis,  No chest pain, patient continues with some cough and some dyspnea, no high fevers, no chills  Assessment  & Plan :    Principal Problem:   Fournier's gangrene in female Active Problems:   CAP (community acquired pneumonia)   Left genital labial abscess   Hypothyroidism   Essential hypertension   Morbid obesity/BMI > 55   Diabetes mellitus type 2 with complications, uncontrolled ([place])   CAD, multiple vessel   Ischemic cardiomyopathy   Chronic combined systolic and diastolic CHF (congestive heart failure) (EF 30 to 35 %)  Brief Summary  63 y.o. female with medical history significant for morbid obesity (BMI > 55), ischemic and myopathy with combined systolic and diastolic diastolic CHF, hypothyroidism, hypertension,  OSA, coronary artery disease, prior TIA admitted on 09/08/2019 with sepsis secondary to pneumonia and left groin abscess, patient has developed significant AKI with oliguria, also there are concerns about possible Fournier's gangrene of the perineum -Transfer from [facility] to [facility] -Dr Brantley Stage (general surgery to see)   A/p -1)Suspected Fournier's gangrene-with involvement of mons pubis, labia, perineum and suprapubic regions with extensive gas formation. Findings worrisome for Fournier's gangrene- Dr Brantley Stage (general surgery to see) -Patient has open draining areas along her left labia and perineum area -Please see full CT abdomen and pelvis report dated 09/10/2019 -Treat empirically with IV Rocephin and IV clindamycin PTA patient  was on Jardiance, SGLT2-inhibitor have been associated with Fournier's gangrene   2)Sepsis secondary to Pneumonia and Perineal Abscess/Fournier's gangrene--patient met sepsis criteria on admission --Fever curve is trending down was (T-max 103.1 to 102.5 on 09/09/19, as of 09/10/2019 patient is largely afebrile, -WBC is down to 17.6 from 21.0  --Continue IV azithromycin, Rocephin and clindamycin, - IV vancomycin discontinued on 09/10/2019 due to worsening renal function -Random vancomycin level was 43  3)AKI----acute kidney injury due to sepsis . Creatinine on admission= 1.88 ,   baseline creatinine =  0.9 , creatinine is now = 4.13 (peak) renally adjust medications, avoid nephrotoxic agents / dehydration / hypotension -- IV vancomycin discontinued on 09/10/2019 due to worsening renal function -Random vancomycin level was 43 -Patient is oliguric -Renal ultrasound and CT abdomen and pelvis without obstructive uropathy PTA patient was on Lasix, Aldactone, Entresto and metformin patient had transient hypotension all of which may have contributed to AKI -Nephrology consult from Dr. Marval Regal appreciated -Defer decision on IV fluids to nephrology team  4)HFrEF/ischemic cardiomyopathy- history of combined diastolic and systolic dysfunction CHF----- c/n  Coreg,   hold Lasix and Aldactone on hold due to soft BP and worsening renal function -stable and asymptomatic at this time -Hold Entresto given worsening renal function -- echo 12/2018  LVEF 30-35%, grade I diastoilc dysfunction -  5)CAD--asymptomatic at this time, chest pain-free -LHC- 09/2017 found to have severe multivessel disease  -Received  DES to LAD and DES x 2 to RCA, LCX disease managed medically. Recs for lifelong plavix per interventional cards. Has been on high dose ASA per neurology (h/o TIA) -Seen by CT surgery, recs were for PCI as opposed to CABG. continue aspirin and Plavix, continue Lipitor,  6)DM2- A1C was 10.2 , reflecting  uncontrolled diabetes- -Lantus insulin 25 units nightly, Use Novolog/Humalog Sliding scale insulin with Accu-Cheks/Fingersticks as ordered -PTA patient was on Jardiance, SGLT2-inhibitor have been associated with Fournier's gangrene   7)Morbid Obesity and OSA--- BMI > 55, This complicates overall care- continue CPAP nightly  Disposition/Need for in-Hospital Stay- patient unable to be discharged at this time due to sepsis secondary to pneumonia and  Fournier's gangrene requiring IV antibiotics, require surgical I&D  Code Status : Full   Family Communication:    (patient is alert, awake and coherent) Discussed with daughter at [phone]  Disposition Plan  : - Transfer from [facility] to [facility] -Dr Brantley Stage (general surgery to see)  Consults  :  gen surgery (Dr Constance Haw and Dr Brantley Stage)  DVT Prophylaxis  :   - Heparin - SCDs   Lab Results  Component Value Date   PLT 211 09/10/2019    Inpatient Medications  Scheduled Meds:  aspirin EC  81 mg Oral Daily   atorvastatin  80 mg Oral QPM   clopidogrel  75 mg Oral q morning - 10a   feeding supplement (ENSURE ENLIVE)  237 mL Oral BID BM   heparin injection (subcutaneous)  5,000 Units Subcutaneous Q8H   insulin aspart  0-15 Units Subcutaneous TID WC   insulin aspart  0-5 Units Subcutaneous QHS   insulin glargine  25 Units Subcutaneous QHS   levothyroxine  125 mcg Oral Q0600   multivitamin with minerals  1 tablet Oral Daily   nystatin   Topical TID   Continuous Infusions:  azithromycin 500 mg (09/09/19 2211)   cefTRIAXone (ROCEPHIN)  IV 2 g (09/09/19 2130)   clindamycin (CLEOCIN) IV     sodium chloride     PRN Meds:.acetaminophen, ipratropium-albuterol, morphine injection, oxyCODONE    Anti-infectives (From admission, onward)   Start     Dose/Rate Route Frequency Ordered Stop   09/10/19 1430  clindamycin (CLEOCIN) IVPB 600 mg     600 mg 100 mL/hr over 30 Minutes Intravenous Every 6 hours 09/10/19 1410     09/10/19  0742  vancomycin variable dose per unstable renal function (pharmacist dosing)  Status:  Discontinued      Does not apply See admin instructions 09/10/19 0742 09/10/19 1413   09/10/19 0000  vancomycin (VANCOCIN) 1,750 mg in sodium chloride 0.9 % 500 mL IVPB  Status:  Discontinued     1,750 mg 250 mL/hr over 120 Minutes Intravenous Every 24 hours 09/09/19 0912 09/09/19 0928   09/10/19 0000  vancomycin (VANCOCIN) 1,750 mg in sodium chloride 0.9 % 500 mL IVPB  Status:  Discontinued     1,750 mg 250 mL/hr over 120 Minutes Intravenous Every 24 hours 09/09/19 0940 09/10/19 0751   09/09/19 1030  vancomycin (VANCOCIN) IVPB 750 mg/150 ml premix  Status:  Discontinued     750 mg 150 mL/hr over 60 Minutes Intravenous  Once 09/09/19 0928 09/09/19 0938   09/09/19 0930  vancomycin (VANCOCIN) IVPB 1000 mg/200 mL premix  Status:  Discontinued     1,000 mg 200 mL/hr over 60 Minutes Intravenous  Once 09/09/19 0928 09/09/19 0938   09/08/19 2300  azithromycin (ZITHROMAX) 500 mg in sodium chloride 0.9 %  250 mL IVPB     500 mg 250 mL/hr over 60 Minutes Intravenous Every 24 hours 09/08/19 2230     09/08/19 2300  cefTRIAXone (ROCEPHIN) 2 g in sodium chloride 0.9 % 100 mL IVPB     2 g 200 mL/hr over 30 Minutes Intravenous Every 24 hours 09/08/19 2230     09/08/19 2300  vancomycin (VANCOCIN) 1,500 mg in sodium chloride 0.9 % 500 mL IVPB     1,500 mg 250 mL/hr over 120 Minutes Intravenous  Once 09/08/19 2254 09/09/19 0434   09/08/19 1600  vancomycin (VANCOCIN) IVPB 1000 mg/200 mL premix     1,000 mg 200 mL/hr over 60 Minutes Intravenous  Once 09/08/19 1558 09/08/19 1929   09/08/19 1600  piperacillin-tazobactam (ZOSYN) IVPB 3.375 g     3.375 g 100 mL/hr over 30 Minutes Intravenous  Once 09/08/19 1558 09/08/19 1655        Objective:   Vitals:   09/09/19 2119 09/10/19 0503 09/10/19 0513 09/10/19 1431  BP: 115/77 111/68  (!) 126/59  Pulse: 78 81  78  Resp:  (!) 22  18  Temp: 99.4 F (37.4 C) 99.8 F (37.7  C)  100.2 F (37.9 C)  TempSrc: Oral Oral  Oral  SpO2: 93% (!) 88% 94% 90%  Weight:      Height:       Temp:  [99.4 F (37.4 C)-100.2 F (37.9 C)] 100.2 F (37.9 C) (09/30 1431) Pulse Rate:  [78-81] 78 (09/30 1431) Resp:  [18-22] 18 (09/30 1431) BP: (111-126)/(59-77) 126/59 (09/30 1431) SpO2:  [88 %-94 %] 90 % (09/30 1431)   Wt Readings from Last 3 Encounters:  09/08/19 (!) 152 kg  09/06/19 (!) 152 kg  06/10/19 (!) 155.7 kg    Intake/Output Summary (Last 24 hours) at 09/10/2019 1451 Last data filed at 09/10/2019 0535 Gross per 24 hour  Intake 1340 ml  Output --  Net 1340 ml     Physical Exam  Gen:- Awake Alert, morbidly obese HEENT:- [place].AT, No sclera icterus Neck-Supple Neck,No JVD,.  Lungs-diminished in bases, no wheezing  CV- S1, S2 normal, regular  Abd-  +ve B.Sounds, Abd Soft, No tenderness, increased truncal adiposity Extremity:-  pedal pulses present  Psych-affect is appropriate, oriented x3 Neuro-generalized weakness,no new focal deficits, no tremors SKIN--- -left lower pubic/vulva/Perineal area-with purulent drainage, swelling, erythema and tenderness GU- Foley to be placed for accurate input/output   Data Review:   Micro Results Recent Results (from the past 240 hour(s))  Urine culture     Status: Abnormal   Collection Time: 09/08/19  3:58 PM   Specimen: In/Out Cath Urine  Result Value Ref Range Status   Specimen Description   Final    IN/OUT CATH URINE Performed at [facility], [address] [place], [place] [address]    Special Requests   Final    NONE Performed at [facility], [address] [place], [place] [address]    Culture >=100,000 COLONIES/mL YEAST (A)  Final   Report Status 09/10/2019 FINAL  Final  SARS Coronavirus 2 [facility] order, Performed in [facility] hospital lab) Nasopharyngeal Nasopharyngeal Swab     Status: None   Collection Time: 09/08/19  3:59 PM   Specimen: Nasopharyngeal Swab  Result Value Ref Range Status    SARS Coronavirus 2 NEGATIVE NEGATIVE Final    Comment: (NOTE) If result is NEGATIVE SARS-CoV-2 target nucleic acids are NOT DETECTED. The SARS-CoV-2 RNA is generally detectable in upper and lower  respiratory specimens  during the acute phase of infection. The lowest  concentration of SARS-CoV-2 viral copies this assay can detect is 250  copies / mL. A negative result does not preclude SARS-CoV-2 infection  and should not be used as the sole basis for treatment or other  patient management decisions.  A negative result may occur with  improper specimen collection / handling, submission of specimen other  than nasopharyngeal swab, presence of viral mutation(s) within the  areas targeted by this assay, and inadequate number of viral copies  (<250 copies / mL). A negative result must be combined with clinical  observations, patient history, and epidemiological information. If result is POSITIVE SARS-CoV-2 target nucleic acids are DETECTED. The SARS-CoV-2 RNA is generally detectable in upper and lower  respiratory specimens dur ing the acute phase of infection.  Positive  results are indicative of active infection with SARS-CoV-2.  Clinical  correlation with patient history and other diagnostic information is  necessary to determine patient infection status.  Positive results do  not rule out bacterial infection or co-infection with other viruses. If result is PRESUMPTIVE POSTIVE SARS-CoV-2 nucleic acids MAY BE PRESENT.   A presumptive positive result was obtained on the submitted specimen  and confirmed on repeat testing.  While 2019 novel coronavirus  (SARS-CoV-2) nucleic acids may be present in the submitted sample  additional confirmatory testing may be necessary for epidemiological  and / or clinical management purposes  to differentiate between  SARS-CoV-2 and other Sarbecovirus currently known to infect humans.  If clinically indicated additional testing with an alternate test   methodology [phone]) is advised. The SARS-CoV-2 RNA is generally  detectable in upper and lower respiratory sp ecimens during the acute  phase of infection. The expected result is Negative. Fact Sheet for Patients:  StrictlyIdeas.no Fact Sheet for Healthcare Providers: BankingDealers.co.za This test is not yet approved or cleared by the [place] FDA and has been authorized for detection and/or diagnosis of SARS-CoV-2 by FDA under an Emergency Use Authorization (EUA).  This EUA will remain in effect (meaning this test can be used) for the duration of the COVID-19 declaration under Section 564(b)(1) of the Act, 21 U.S.C. section 360bbb-3(b)(1), unless the authorization is terminated or revoked sooner. Performed at [facility], [address], [place], [place] [address]   Blood Culture (routine x 2)     Status: None (Preliminary result)   Collection Time: 09/08/19  4:07 PM   Specimen: BLOOD RIGHT WRIST  Result Value Ref Range Status   Specimen Description BLOOD RIGHT WRIST  Final   Special Requests   Final    BOTTLES DRAWN AEROBIC AND ANAEROBIC Blood Culture adequate volume   Culture   Final    NO GROWTH 2 DAYS Performed at [facility], [address], [place], [place] [address]    Report Status PENDING  Incomplete  Blood Culture (routine x 2)     Status: None (Preliminary result)   Collection Time: 09/08/19  4:17 PM   Specimen: BLOOD LEFT HAND  Result Value Ref Range Status   Specimen Description BLOOD LEFT HAND  Final   Special Requests   Final    BOTTLES DRAWN AEROBIC AND ANAEROBIC Blood Culture adequate volume   Culture   Final    NO GROWTH 2 DAYS Performed at [facility], [address], [place], [place] [address]    Report Status PENDING  Incomplete    Radiology Reports Ct Abdomen Pelvis Wo Contrast  Result Date: 09/10/2019 CLINICAL DATA:  Left-sided  labial swelling for 3-4 days. Possible abscess. EXAM: CT ABDOMEN  AND PELVIS WITHOUT CONTRAST TECHNIQUE: Multidetector CT imaging of the abdomen and pelvis was performed following the standard protocol without IV contrast. COMPARISON:  None. FINDINGS: Lower chest: The heart is enlarged and there is a small pericardial effusion. Age advanced coronary artery calcifications are noted. Small bilateral pleural effusions with overlying atelectasis are noted. Hepatobiliary: No focal hepatic lesions are identified without contrast. Moderate respiratory motion to the liver limits evaluation. The gallbladder is grossly normal. No obvious common bile duct dilatation. Pancreas: Limited examination due to motion artifact but no gross abnormality. Spleen: Normal size.  No focal lesions. Adrenals/Urinary Tract: The adrenal glands are grossly normal. No obvious renal calculi, hydronephrosis or mass. The bladder is grossly normal. Stomach/Bowel: Limited evaluation due to motion artifact. No gross bowel abnormality or obstructive findings. The terminal ileum and appendix appear normal. Vascular/Lymphatic: Moderate atherosclerotic calcifications involving the aorta and iliac arteries but no aneurysm. Scattered mesenteric and retroperitoneal lymph nodes without mass or overt adenopathy. Reproductive: Surgically absent. Other: Marked diffuse subcutaneous soft tissue swelling/edema/fluid and gas in the region of the mons pubis, left labia, perineum and suprapubic region. I do not see a discrete drainable fluid collection or fistula. No intrapelvic component is identified. Findings worrisome for Fournier gangrene. Adjacent inflamed/enlarged inguinal lymph nodes are noted. Musculoskeletal: No significant bony findings. I do not see any evidence of septic arthritis or osteomyelitis. IMPRESSION: 1. Extensive inflammatory/infectious process involving the mons pubis, labia, perineum and suprapubic regions with extensive gas formation. Findings worrisome for Fournier's gangrene. No discrete drainable soft  tissue abscess. 2. No intra-abdominal/intrapelvic abscess or obvious inflammatory process. 3. Small bilateral pleural effusions with overlying atelectasis. These results will be called to the ordering clinician or representative by the Radiologist Assistant, and communication documented in the PACS or zVision Dashboard. Electronically Signed   By: Marijo Sanes M.D.   On: 09/10/2019 12:05   US Renal  Result Date: 09/10/2019 CLINICAL DATA:  Acute kidney injury. EXAM: RENAL / URINARY TRACT ULTRASOUND COMPLETE COMPARISON:  November 30, 2009 FINDINGS: Right Kidney: Renal measurements: 13.1 x 5.2 x 5.3 cm = volume: 194 mL . Echogenicity within normal limits. No mass or hydronephrosis visualized. Left Kidney: Renal measurements: 13.5 x 5 x 5.3 cm = volume: 188 mL. Echogenicity within normal limits. No mass or hydronephrosis visualized. Bladder: Not visualized. IMPRESSION: 1. Body habitus limited study. 2. Given the above limitation, no acute abnormality was detected. No hydronephrosis. Electronically Signed   By: Constance Holster M.D.   On: 09/10/2019 10:09   US Abdomen Limited  Result Date: 09/09/2019 CLINICAL DATA:  Left labia swelling. EXAM: LIMITED ULTRASOUND OF PELVIS TECHNIQUE: Limited transabdominal ultrasound examination of the pelvis was performed. COMPARISON:  None. FINDINGS: Ultrasound performed in the area of concern in the left labia. No fluid collection or other soft tissue abnormality noted. IMPRESSION: No focal fluid collection or other abnormality noted. Electronically Signed   By: Rolm Baptise M.D.   On: 09/09/2019 10:27   Dg Chest Port 1 View  Result Date: 09/08/2019 CLINICAL DATA:  Fever EXAM: PORTABLE CHEST 1 VIEW COMPARISON:  July 16, 2018 FINDINGS: There is airspace consolidation in each lower lung zone. There is cardiomegaly with pulmonary venous hypertension. No adenopathy evident. No bone lesions. There is postoperative change in the cervicothoracic junction region. IMPRESSION:  Lower lung zone consolidation bilaterally, likely multifocal pneumonia. Underlying pulmonary vascular congestion. No adenopathy evident. Electronically Signed   By: Lowella Grip III M.D.  On: 09/08/2019 17:08     CBC Recent Labs  Lab 09/08/19 1607 09/10/19 0559  WBC 21.0* 17.6*  HGB 9.7* 9.4*  HCT 31.0* 30.8*  PLT 222 211  MCV 87.8 89.5  MCH 27.5 27.3  MCHC 31.3 30.5  RDW 13.3 13.8  LYMPHSABS 2.4  --   MONOABS 1.5*  --   EOSABS 0.1  --   BASOSABS 0.0  --     Chemistries  Recent Labs  Lab 09/08/19 1607 09/10/19 0559  NA 133* 135  K 3.6 4.0  CL 100 100  CO2 23 22  GLUCOSE 268* 170*  BUN 30* 53*  CREATININE 1.88* 4.13*  CALCIUM 7.3* 7.0*  AST 33 52*  ALT 32 32  ALKPHOS 183* 164*  BILITOT 1.7* 1.0   ------------------------------------------------------------------------------------------------------------------ No results for input(s): CHOL, HDL, LDLCALC, TRIG, CHOLHDL, LDLDIRECT in the last 72 hours.  Lab Results  Component Value Date   HGBA1C 10.2 (H) 09/08/2019   ------------------------------------------------------------------------------------------------------------------ No results for input(s): TSH, T4TOTAL, T3FREE, THYROIDAB in the last 72 hours.  Invalid input(s): FREET3 ------------------------------------------------------------------------------------------------------------------ No results for input(s): VITAMINB12, FOLATE, FERRITIN, TIBC, IRON, RETICCTPCT in the last 72 hours.  Coagulation profile Recent Labs  Lab 09/08/19 1607  INR 1.2    No results for input(s): DDIMER in the last 72 hours.  Cardiac Enzymes No results for input(s): CKMB, TROPONINI, MYOGLOBIN in the last 168 hours.  Invalid input(s): CK ------------------------------------------------------------------------------------------------------------------    Component Value Date/Time   BNP 50.2 07/16/2018 2048     Fantasy Donald M.D on 09/10/2019 at 2:51 PM  Go  to [email or web address] - for contact info  [facility] - Office  [phone]

## 2019-09-10 NOTE — Progress Notes (Signed)
2250: [facility] here to transport patient to [facility]. Report given.  Questions answered.  Report called to 6N.  Questions answered.  Daughter updated. Patient in no acute distress, VSS.

## 2019-09-10 NOTE — Progress Notes (Signed)
Inpatient Diabetes Program Recommendations  AACE/ADA: New Consensus Statement on Inpatient Glycemic Control (2015)  Target Ranges:  Prepandial:   less than 140 mg/dL      Peak postprandial:   less than 180 mg/dL (1-2 hours)      Critically ill patients:  140 - 180 mg/dL   Lab Results  Component Value Date   GLUCAP 144 (H) 09/10/2019   HGBA1C 10.2 (H) 09/08/2019    Review of Glycemic Control  Diabetes history: DM 2 Outpatient Diabetes medications: Tresiba 50 units, Novolog 15 units tid, Jardiance 10 mg Daily, Metformin 1000 mg bid Current orders for Inpatient glycemic control:  Lantus 25 units qhs Novolog 0-15 units tid + hs   Inpatient Diabetes Program Recommendations:    Note pt is on Jardiance at home, an SGLT-2 inhibitor known to cause fornier's gangrene and other genitourinary infections.   May consider discontinuing medication at time of d/c and have her follow up with prescriber.  Current Glucose trends in the 140's. Will follow during transfer and hospitalization.  Thanks,  Tama Headings RN, MSN, BC-ADM Inpatient Diabetes Coordinator Team Pager [phone] (8a-5p)

## 2019-09-10 NOTE — Consult Note (Addendum)
[place]  Reason for Consult: Labial abscess, left Referring Physician:  Dr. Denton Brick   Notified of consult this Am.   Chief Complaint    Weakness      HPI: Maureen Jackson is a 63 y.o. female with severe obesity, CHF with EF 35% (12/2018), DM, who came to the hospital with complaints of weakness and cough. She weas admitted on 09/08/2019 and was found to have an abscess of her left labia that had been seen in the ED 2 days prior on 9/26. The area was draining and the patient was sent home on antibiotics. She represented with the above complaints and was found to have WBC to 21 and was slightly hyponatremic with AKI and pneumonia. The hospital team ordered an US of the labia yesterday without any obvious fluid collections and placed a consult yesterday evening which I was notified about this AM.  The patient continues to have a leukocytosis to 17 and a worsening AKI to 4.8.   The pubic / labia region continue to drain per the patient and her RN. She is on the floor and has been stable without any signs of shock.  This AM I discussed the case with Dr. Denton Brick, and we discussed CT to rule out any drainable collections. I could not see any collection on bedside ultrasound.   Past Medical History:  Diagnosis Date  . 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 Bleeding from mouth    when she brushed teeth or ate  . Chronic systolic CHF (congestive heart failure) ([place])   . Coronary artery disease 09/2017   a. multivessel CAD by cath in 09/2017 and not felt to be a CABG candidate --> underwent two-vessel PCI with DES to the LAD and DES to the RCA  . Diabetes mellitus   . Dyspnea   . Glaucoma   . Hypertension   . Left bundle branch block   . Morbid obesity ([place])   . Obstructive sleep apnea    does not wear CPAP  . Thyroid disease     Past Surgical History:  Procedure Laterality Date  . ABDOMINAL HYSTERECTOMY    . CARDIAC CATHETERIZATION  09/12/2017  . CORONARY STENT  INTERVENTION  09/12/2017   STENT RESOLUTE ONYX 2.20U54 drug eluting stent was successfully placed  . CORONARY STENT INTERVENTION N/A 09/12/2017   Procedure: CORONARY STENT INTERVENTION;  Surgeon: Leonie Man, MD;  Location: [facility] [facility];  Service: Cardiovascular;  Laterality: N/A;  . LEFT HEART CATH AND CORONARY ANGIOGRAPHY N/A 09/12/2017   Procedure: LEFT HEART CATH AND CORONARY ANGIOGRAPHY;  Surgeon: Leonie Man, MD;  Location: [facility] [facility];  Service: Cardiovascular;  Laterality: N/A;  . MASS EXCISION N/A 12/18/2018   Procedure: EXCISION TONGUE MASS;  Surgeon: Leta Baptist, MD;  Location: [facility];  Service: ENT;  Laterality: N/A;  . RIGHT/LEFT HEART CATH AND CORONARY ANGIOGRAPHY N/A 09/10/2017   Procedure: RIGHT/LEFT HEART CATH AND CORONARY ANGIOGRAPHY;  Surgeon: Troy Sine, MD;  Location: [facility];  Service: Cardiovascular;  Laterality: N/A;  . THYROID SURGERY      Family History  Problem Relation Age of Onset  . Diabetes Mother   . Hypertension Mother   . Cancer Mother        pancreas  . Hypertension Sister     Social History   Tobacco Use  . Smoking status: Former Smoker    Quit date: 12/14/2005    Years since  quitting: 13.7  . Smokeless tobacco: Never Used  . Tobacco comment: QUIT IN 2005  Substance Use Topics  . Alcohol use: No  . Drug use: No    Medications:  I have reviewed the patient's current medications. Prior to Admission:  Medications Prior to Admission  Medication Sig Dispense Refill Last Dose  . amLODipine (NORVASC) 5 MG tablet TAKE 1 TABLET BY MOUTH EVERY DAY (Patient taking differently: Take 5 mg by mouth daily. ) 90 tablet 3 09/08/2019 at Unknown time  . aspirin EC 81 MG tablet Take 81 mg by mouth daily.   09/07/2019 at Unknown time  . atorvastatin (LIPITOR) 80 MG tablet Take 1 tablet (80 mg total) by mouth every evening. 90 tablet 3 09/07/2019 at Unknown time  . carvedilol (COREG) 25 MG tablet Take 1 tablet (25 mg total) by mouth 2  (two) times daily. 180 tablet 3 09/08/2019 at 1000  . cetirizine (ZYRTEC) 10 MG tablet Take 10 mg by mouth daily as needed for allergies.   0 unknown  . clopidogrel (PLAVIX) 75 MG tablet TAKE 1 TABLET(75 MG) BY MOUTH DAILY WITH BREAKFAST (Patient taking differently: Take 75 mg by mouth every morning. ) 90 tablet 3 09/08/2019 at Unknown time  . doxycycline (VIBRAMYCIN) 100 MG capsule Take 1 capsule (100 mg total) by mouth 2 (two) times daily. 14 capsule 0 09/08/2019 at Unknown time  . ENTRESTO 97-103 MG TAKE 1 TABLET BY MOUTH TWICE DAILY (Patient taking differently: Take 1 tablet by mouth 2 (two) times daily. ) 180 tablet 3 09/08/2019 at Unknown time  . furosemide (LASIX) 40 MG tablet Take 1 tablet (40 mg total) by mouth daily. 30 tablet 0 09/08/2019 at Unknown time  . insulin degludec (TRESIBA) 100 UNIT/ML SOPN FlexTouch Pen Inject 0.16 mLs (16 Units total) into the skin daily. Please include needles as well for injection (Patient taking differently: Inject 50 Units into the skin daily. ) 2 pen 0 09/07/2019 at Unknown time  . JARDIANCE 10 MG TABS tablet Take 1 tablet by mouth daily.   unknown  . levothyroxine (SYNTHROID, LEVOTHROID) 125 MCG tablet Take 125 mcg by mouth daily before breakfast.   0 09/08/2019 at Unknown time  . metFORMIN (GLUCOPHAGE) 1000 MG tablet Take 1,000 mg by mouth 2 (two) times daily.   09/08/2019 at Unknown time  . NOVOLOG FLEXPEN 100 UNIT/ML FlexPen Inject 15 Units into the skin 3 (three) times daily with meals.    09/08/2019 at Unknown time  . ondansetron (ZOFRAN ODT) 4 MG disintegrating tablet Take 1 tablet (4 mg total) by mouth every 8 (eight) hours as needed. 10 tablet 1 Past Week at Unknown time  . spironolactone (ALDACTONE) 25 MG tablet TAKE 1 TABLET(25 MG) BY MOUTH DAILY (Patient taking differently: Take 25 mg by mouth daily. ) 90 tablet 3 09/08/2019 at Unknown time  . timolol (TIMOPTIC) 0.5 % ophthalmic solution Place 1 drop into both eyes 2 (two) times daily.  5 09/07/2019 at  Unknown time  . VENTOLIN HFA 108 (90 Base) MCG/ACT inhaler Inhale 1-2 puffs into the lungs every 6 (six) hours as needed for wheezing or shortness of breath. 1 Inhaler 0 unknown  . HYDROcodone-acetaminophen (NORCO/VICODIN) 5-325 MG tablet Take 1 tablet by mouth every 6 (six) hours as needed for moderate pain. (Patient not taking: Reported on 09/08/2019) 14 tablet 0 Not Taking at Unknown time  . Insulin Pen Needle 32G X 4 MM MISC For use with lantus solostar-- change substitute if needed (Patient not taking: Reported  on 09/08/2019) 100 each 0 Not Taking at Unknown time  . oxyCODONE-acetaminophen (PERCOCET/ROXICET) 5-325 MG tablet Take 2 tablets by mouth every 4 (four) hours as needed for severe pain. (Patient not taking: Reported on 09/08/2019) 6 tablet 0 Not Taking at Unknown time  . oxyCODONE-acetaminophen (PERCOCET/ROXICET) 5-325 MG tablet Take 1 tablet by mouth every 4 (four) hours as needed. (Patient not taking: Reported on 09/08/2019) 8 tablet 0 Not Taking at Unknown time   Scheduled: . aspirin EC  81 mg Oral Daily  . atorvastatin  80 mg Oral QPM  . clopidogrel  75 mg Oral q morning - 10a  . feeding supplement (ENSURE ENLIVE)  237 mL Oral BID BM  . heparin injection (subcutaneous)  5,000 Units Subcutaneous Q8H  . insulin aspart  0-15 Units Subcutaneous TID WC  . insulin aspart  0-5 Units Subcutaneous QHS  . insulin glargine  25 Units Subcutaneous QHS  . levothyroxine  125 mcg Oral Q0600  . multivitamin with minerals  1 tablet Oral Daily  . nystatin   Topical TID  . vancomycin variable dose per unstable renal function (pharmacist dosing)   Does not apply See admin instructions   Continuous: . azithromycin 500 mg (09/09/19 2211)  . cefTRIAXone (ROCEPHIN)  IV 2 g (09/09/19 2130)  . sodium chloride     SHF:WYOVZCHYIFOYD, ipratropium-albuterol, morphine injection, oxyCODONE  Allergies  Allergen Reactions  . Aspirin Nausea Only     ROS:  A comprehensive review of systems was negative  except for: Respiratory: positive for Pneumonia Genitourinary: positive for purulent drainage, tenderness  Blood pressure 111/68, pulse 81, temperature 99.8 F (37.7 C), temperature source Oral, resp. rate (!) 22, height 5\' 5"  (1.651 m), weight (!) 152 kg, SpO2 94 %. Physical Exam Vitals signs reviewed.  Constitutional:      Appearance: She is obese.  HENT:     Head: Normocephalic.     Nose: Nose normal.     Mouth/Throat:     Mouth: Mucous membranes are moist.  Eyes:     Extraocular Movements: Extraocular movements intact.     Pupils: Pupils are equal, round, and reactive to light.  Cardiovascular:     Rate and Rhythm: Normal rate and regular rhythm.  Pulmonary:     Effort: Pulmonary effort is normal.  Abdominal:     General: There is no distension.     Palpations: Abdomen is soft.     Tenderness: There is no abdominal tenderness.  Genitourinary:    Comments: Suprapubic mons very edematous and tender, no collection no bedside US, labia on left with open wound about .5cm with purulent drainage, No bullae, no erythema no skin changes, no crepitus appreciated but the area is very indurated and firm, tenderness in the mons mostly and labia is not tender Musculoskeletal: Normal range of motion.        General: No swelling.  Skin:    General: Skin is warm and dry.  Neurological:     General: No focal deficit present.     Mental Status: She is alert and oriented to person, place, and time.  Psychiatric:        Mood and Affect: Mood normal.        Behavior: Behavior normal.        Thought Content: Thought content normal.        Judgment: Judgment normal.     Results: Results for orders placed or performed during the hospital encounter of 09/08/19 (from the past 48 hour(s))  Urinalysis, Routine w reflex microscopic     Status: Abnormal   Collection Time: 09/08/19  3:58 PM  Result Value Ref Range   Color, Urine AMBER (A) YELLOW    Comment: BIOCHEMICALS MAY BE AFFECTED BY COLOR    APPearance CLOUDY (A) CLEAR   Specific Gravity, Urine 1.024 1.005 - 1.030   pH 5.0 5.0 - 8.0   Glucose, UA NEGATIVE NEGATIVE mg/dL   Hgb urine dipstick SMALL (A) NEGATIVE   Bilirubin Urine NEGATIVE NEGATIVE   Ketones, ur NEGATIVE NEGATIVE mg/dL   Protein, ur 100 (A) NEGATIVE mg/dL   Nitrite NEGATIVE NEGATIVE   Leukocytes,Ua NEGATIVE NEGATIVE   RBC / HPF 6-10 0 - 5 RBC/hpf   WBC, UA 6-10 0 - 5 WBC/hpf   Bacteria, UA FEW (A) NONE SEEN   Squamous Epithelial / LPF 0-5 0 - 5   Budding Yeast PRESENT    Granular Casts, UA PRESENT    Amorphous Crystal PRESENT     Comment: Performed at [facility], [address], [place], [place] [address]  Urine culture     Status: Abnormal   Collection Time: 09/08/19  3:58 PM   Specimen: In/Out Cath Urine  Result Value Ref Range   Specimen Description      IN/OUT CATH URINE Performed at [facility], [address] [place], [place] [address]    Special Requests      NONE Performed at [facility], [address], [place],  [address]    Culture >=100,000 COLONIES/mL YEAST (A)    Report Status 09/10/2019 FINAL   SARS Coronavirus 2 [facility] order, Performed in [facility] hospital lab) Nasopharyngeal Nasopharyngeal Swab     Status: None   Collection Time: 09/08/19  3:59 PM   Specimen: Nasopharyngeal Swab  Result Value Ref Range   SARS Coronavirus 2 NEGATIVE NEGATIVE    Comment: (NOTE) If result is NEGATIVE SARS-CoV-2 target nucleic acids are NOT DETECTED. The SARS-CoV-2 RNA is generally detectable in upper and lower  respiratory specimens during the acute phase of infection. The lowest  concentration of SARS-CoV-2 viral copies this assay can detect is 250  copies / mL. A negative result does not preclude SARS-CoV-2 infection  and should not be used as the sole basis for treatment or other  patient management decisions.  A negative result may occur with  improper specimen collection / handling, submission of specimen other  than  nasopharyngeal swab, presence of viral mutation(s) within the  areas targeted by this assay, and inadequate number of viral copies  (<250 copies / mL). A negative result must be combined with clinical  observations, patient history, and epidemiological information. If result is POSITIVE SARS-CoV-2 target nucleic acids are DETECTED. The SARS-CoV-2 RNA is generally detectable in upper and lower  respiratory specimens dur ing the acute phase of infection.  Positive  results are indicative of active infection with SARS-CoV-2.  Clinical  correlation with patient history and other diagnostic information is  necessary to determine patient infection status.  Positive results do  not rule out bacterial infection or co-infection with other viruses. If result is PRESUMPTIVE POSTIVE SARS-CoV-2 nucleic acids MAY BE PRESENT.   A presumptive positive result was obtained on the submitted specimen  and confirmed on repeat testing.  While 2019 novel coronavirus  (SARS-CoV-2) nucleic acids may be present in the submitted sample  additional confirmatory testing may be necessary for epidemiological  and / or clinical management purposes  to differentiate between  SARS-CoV-2 and other Sarbecovirus  currently known to infect humans.  If clinically indicated additional testing with an alternate test  methodology [phone]) is advised. The SARS-CoV-2 RNA is generally  detectable in upper and lower respiratory sp ecimens during the acute  phase of infection. The expected result is Negative. Fact Sheet for Patients:  StrictlyIdeas.no Fact Sheet for Healthcare Providers: BankingDealers.co.za This test is not yet approved or cleared by the [place] FDA and has been authorized for detection and/or diagnosis of SARS-CoV-2 by FDA under an Emergency Use Authorization (EUA).  This EUA will remain in effect (meaning this test can be used) for the duration of  the COVID-19 declaration under Section 564(b)(1) of the Act, 21 U.S.C. section 360bbb-3(b)(1), unless the authorization is terminated or revoked sooner. Performed at [facility], [address] [place], [place] [address]   Lactic acid, plasma     Status: None   Collection Time: 09/08/19  4:07 PM  Result Value Ref Range   Lactic Acid, Venous 1.9 0.5 - 1.9 mmol/L    Comment: Performed at [facility], [address], [place], [place] [address]  Comprehensive metabolic panel     Status: Abnormal   Collection Time: 09/08/19  4:07 PM  Result Value Ref Range   Sodium 133 (L) 135 - 145 mmol/L   Potassium 3.6 3.5 - 5.1 mmol/L   Chloride 100 98 - 111 mmol/L   CO2 23 22 - 32 mmol/L   Glucose, Bld 268 (H) 70 - 99 mg/dL   BUN 30 (H) 8 - 23 mg/dL   Creatinine, Ser 1.88 (H) 0.44 - 1.00 mg/dL   Calcium 7.3 (L) 8.9 - 10.3 mg/dL   Total Protein 7.2 6.5 - 8.1 g/dL   Albumin 2.6 (L) 3.5 - 5.0 g/dL   AST 33 15 - 41 U/L   ALT 32 0 - 44 U/L   Alkaline Phosphatase 183 (H) 38 - 126 U/L   Total Bilirubin 1.7 (H) 0.3 - 1.2 mg/dL   GFR calc non Af Amer 28 (L) >60 mL/min   GFR calc Af Amer 33 (L) >60 mL/min   Anion gap 10 5 - 15    Comment: Performed at [facility], [address] [place], [place] [address]  CBC WITH DIFFERENTIAL     Status: Abnormal   Collection Time: 09/08/19  4:07 PM  Result Value Ref Range   WBC 21.0 (H) 4.0 - 10.5 K/uL   RBC 3.53 (L) 3.87 - 5.11 MIL/uL   Hemoglobin 9.7 (L) 12.0 - 15.0 g/dL   HCT 31.0 (L) 36.0 - 46.0 %   MCV 87.8 80.0 - 100.0 fL   MCH 27.5 26.0 - 34.0 pg   MCHC 31.3 30.0 - 36.0 g/dL   RDW 13.3 11.5 - 15.5 %   Platelets 222 150 - 400 K/uL   nRBC 0.0 0.0 - 0.2 %   Neutrophils Relative % 81 %   Neutro Abs 16.8 (H) 1.7 - 7.7 K/uL   Lymphocytes Relative 11 %   Lymphs Abs 2.4 0.7 - 4.0 K/uL   Monocytes Relative 7 %   Monocytes Absolute 1.5 (H) 0.1 - 1.0 K/uL   Eosinophils Relative 0 %   Eosinophils Absolute 0.1 0.0 - 0.5 K/uL   Basophils Relative 0 %    Basophils Absolute 0.0 0.0 - 0.1 K/uL   WBC Morphology DOHLE BODIES     Comment: MILD LEFT SHIFT (1-5% METAS, OCC MYELO, OCC BANDS)   Immature Granulocytes 1 %   Abs Immature Granulocytes 0.21 (H) 0.00 -  0.07 K/uL    Comment: Performed at [facility], [address], [place], [place] [address]  APTT     Status: None   Collection Time: 09/08/19  4:07 PM  Result Value Ref Range   aPTT 30 24 - 36 seconds    Comment: Performed at [facility], [address], [place], [place] [address]  Protime-INR     Status: Abnormal   Collection Time: 09/08/19  4:07 PM  Result Value Ref Range   Prothrombin Time 15.5 (H) 11.4 - 15.2 seconds   INR 1.2 0.8 - 1.2    Comment: (NOTE) INR goal varies based on device and disease states. Performed at [facility], [address], [place], [place] [address]   Blood Culture (routine x 2)     Status: None (Preliminary result)   Collection Time: 09/08/19  4:07 PM   Specimen: BLOOD RIGHT WRIST  Result Value Ref Range   Specimen Description BLOOD RIGHT WRIST    Special Requests      BOTTLES DRAWN AEROBIC AND ANAEROBIC Blood Culture adequate volume   Culture      NO GROWTH 2 DAYS Performed at [facility], [address], [place], [place] [address]    Report Status PENDING   Blood Culture (routine x 2)     Status: None (Preliminary result)   Collection Time: 09/08/19  4:17 PM   Specimen: BLOOD LEFT HAND  Result Value Ref Range   Specimen Description BLOOD LEFT HAND    Special Requests      BOTTLES DRAWN AEROBIC AND ANAEROBIC Blood Culture adequate volume   Culture      NO GROWTH 2 DAYS Performed at [facility], [address], [place], [place] [address]    Report Status PENDING   Lactic acid, plasma     Status: None   Collection Time: 09/08/19  5:25 PM  Result Value Ref Range   Lactic Acid, Venous 1.5 0.5 - 1.9 mmol/L    Comment: Performed at [facility], [address], [place], [place] [address]  Hemoglobin A1c     Status: Abnormal   Collection Time:  09/08/19  5:25 PM  Result Value Ref Range   Hgb A1c MFr Bld 10.2 (H) 4.8 - 5.6 %    Comment: (NOTE) Pre diabetes:          5.7%-6.4% Diabetes:              >6.4% Glycemic control for   <7.0% adults with diabetes    Mean Plasma Glucose 246.04 mg/dL    Comment: Performed at [facility], [address] [place], [place] [address]  Glucose, capillary     Status: Abnormal   Collection Time: 09/08/19 11:22 PM  Result Value Ref Range   Glucose-Capillary 228 (H) 70 - 99 mg/dL   Comment 1 Notify RN    Comment 2 Document in Chart   Glucose, capillary     Status: Abnormal   Collection Time: 09/09/19  7:37 AM  Result Value Ref Range   Glucose-Capillary 231 (H) 70 - 99 mg/dL  Glucose, capillary     Status: Abnormal   Collection Time: 09/09/19 11:18 AM  Result Value Ref Range   Glucose-Capillary 202 (H) 70 - 99 mg/dL  Glucose, capillary     Status: Abnormal   Collection Time: 09/09/19  4:53 PM  Result Value Ref Range   Glucose-Capillary 189 (H) 70 - 99 mg/dL  Glucose, capillary     Status: Abnormal  Collection Time: 09/09/19  9:20 PM  Result Value Ref Range   Glucose-Capillary 179 (H) 70 - 99 mg/dL   Comment 1 Notify RN    Comment 2 Document in Chart   CBC     Status: Abnormal   Collection Time: 09/10/19  5:59 AM  Result Value Ref Range   WBC 17.6 (H) 4.0 - 10.5 K/uL   RBC 3.44 (L) 3.87 - 5.11 MIL/uL   Hemoglobin 9.4 (L) 12.0 - 15.0 g/dL   HCT 30.8 (L) 36.0 - 46.0 %   MCV 89.5 80.0 - 100.0 fL   MCH 27.3 26.0 - 34.0 pg   MCHC 30.5 30.0 - 36.0 g/dL   RDW 13.8 11.5 - 15.5 %   Platelets 211 150 - 400 K/uL   nRBC 0.0 0.0 - 0.2 %    Comment: Performed at [facility], [address] [place], [place] [address]  Comprehensive metabolic panel     Status: Abnormal   Collection Time: 09/10/19  5:59 AM  Result Value Ref Range   Sodium 135 135 - 145 mmol/L   Potassium 4.0 3.5 - 5.1 mmol/L   Chloride 100 98 - 111 mmol/L   CO2 22 22 - 32 mmol/L   Glucose, Bld 170 (H) 70 - 99 mg/dL    BUN 53 (H) 8 - 23 mg/dL   Creatinine, Ser 4.13 (H) 0.44 - 1.00 mg/dL   Calcium 7.0 (L) 8.9 - 10.3 mg/dL   Total Protein 6.7 6.5 - 8.1 g/dL   Albumin 2.1 (L) 3.5 - 5.0 g/dL   AST 52 (H) 15 - 41 U/L   ALT 32 0 - 44 U/L   Alkaline Phosphatase 164 (H) 38 - 126 U/L   Total Bilirubin 1.0 0.3 - 1.2 mg/dL   GFR calc non Af Amer 11 (L) >60 mL/min   GFR calc Af Amer 13 (L) >60 mL/min   Anion gap 13 5 - 15    Comment: Performed at [facility], [address] [place], [place] [address]  Glucose, capillary     Status: Abnormal   Collection Time: 09/10/19  7:33 AM  Result Value Ref Range   Glucose-Capillary 148 (H) 70 - 99 mg/dL   Comment 1 Notify RN    Comment 2 Document in Chart   Vancomycin, random     Status: None   Collection Time: 09/10/19  8:45 AM  Result Value Ref Range   Vancomycin Rm 43     Comment:        Random Vancomycin therapeutic range is dependent on dosage and time of specimen collection. A peak range is 20.0-40.0 ug/mL A trough range is 5.0-15.0 ug/mL        Performed at [facility], [address] [place], [place] [address]   CK     Status: Abnormal   Collection Time: 09/10/19 10:54 AM  Result Value Ref Range   Total CK 732 (H) 38 - 234 U/L    Comment: Performed at [facility], [address], [place], [place] [address]  Glucose, capillary     Status: Abnormal   Collection Time: 09/10/19 11:57 AM  Result Value Ref Range   Glucose-Capillary 144 (H) 70 - 99 mg/dL   Comment 1 Notify RN    Comment 2 Document in Chart    Reviewed imaging personally and with Dr. Thornton Papas- CT with gas tracking along the mons and down into the left groin/ labia, induration and inflammation surrounding, no drainable collection; definitely concerning for developing  necrotizing infection   Ct Abdomen Pelvis Wo Contrast  Result Date: 09/10/2019 CLINICAL DATA:  Left-sided labial swelling for 3-4 days. Possible abscess. EXAM: CT ABDOMEN AND PELVIS WITHOUT CONTRAST TECHNIQUE: Multidetector CT  imaging of the abdomen and pelvis was performed following the standard protocol without IV contrast. COMPARISON:  None. FINDINGS: Lower chest: The heart is enlarged and there is a small pericardial effusion. Age advanced coronary artery calcifications are noted. Small bilateral pleural effusions with overlying atelectasis are noted. Hepatobiliary: No focal hepatic lesions are identified without contrast. Moderate respiratory motion to the liver limits evaluation. The gallbladder is grossly normal. No obvious common bile duct dilatation. Pancreas: Limited examination due to motion artifact but no gross abnormality. Spleen: Normal size.  No focal lesions. Adrenals/Urinary Tract: The adrenal glands are grossly normal. No obvious renal calculi, hydronephrosis or mass. The bladder is grossly normal. Stomach/Bowel: Limited evaluation due to motion artifact. No gross bowel abnormality or obstructive findings. The terminal ileum and appendix appear normal. Vascular/Lymphatic: Moderate atherosclerotic calcifications involving the aorta and iliac arteries but no aneurysm. Scattered mesenteric and retroperitoneal lymph nodes without mass or overt adenopathy. Reproductive: Surgically absent. Other: Marked diffuse subcutaneous soft tissue swelling/edema/fluid and gas in the region of the mons pubis, left labia, perineum and suprapubic region. I do not see a discrete drainable fluid collection or fistula. No intrapelvic component is identified. Findings worrisome for Fournier gangrene. Adjacent inflamed/enlarged inguinal lymph nodes are noted. Musculoskeletal: No significant bony findings. I do not see any evidence of septic arthritis or osteomyelitis. IMPRESSION: 1. Extensive inflammatory/infectious process involving the mons pubis, labia, perineum and suprapubic regions with extensive gas formation. Findings worrisome for Fournier's gangrene. No discrete drainable soft tissue abscess. 2. No intra-abdominal/intrapelvic abscess  or obvious inflammatory process. 3. Small bilateral pleural effusions with overlying atelectasis. These results will be called to the ordering clinician or representative by the Radiologist Assistant, and communication documented in the PACS or zVision Dashboard. Electronically Signed   By: Marijo Sanes M.D.   On: 09/10/2019 12:05   US Renal  Result Date: 09/10/2019 CLINICAL DATA:  Acute kidney injury. EXAM: RENAL / URINARY TRACT ULTRASOUND COMPLETE COMPARISON:  November 30, 2009 FINDINGS: Right Kidney: Renal measurements: 13.1 x 5.2 x 5.3 cm = volume: 194 mL . Echogenicity within normal limits. No mass or hydronephrosis visualized. Left Kidney: Renal measurements: 13.5 x 5 x 5.3 cm = volume: 188 mL. Echogenicity within normal limits. No mass or hydronephrosis visualized. Bladder: Not visualized. IMPRESSION: 1. Body habitus limited study. 2. Given the above limitation, no acute abnormality was detected. No hydronephrosis. Electronically Signed   By: Constance Holster M.D.   On: 09/10/2019 10:09   US Abdomen Limited  Result Date: 09/09/2019 CLINICAL DATA:  Left labia swelling. EXAM: LIMITED ULTRASOUND OF PELVIS TECHNIQUE: Limited transabdominal ultrasound examination of the pelvis was performed. COMPARISON:  None. FINDINGS: Ultrasound performed in the area of concern in the left labia. No fluid collection or other soft tissue abnormality noted. IMPRESSION: No focal fluid collection or other abnormality noted. Electronically Signed   By: Rolm Baptise M.D.   On: 09/09/2019 10:27   Dg Chest Port 1 View  Result Date: 09/08/2019 CLINICAL DATA:  Fever EXAM: PORTABLE CHEST 1 VIEW COMPARISON:  July 16, 2018 FINDINGS: There is airspace consolidation in each lower lung zone. There is cardiomegaly with pulmonary venous hypertension. No adenopathy evident. No bone lesions. There is postoperative change in the cervicothoracic junction region. IMPRESSION: Lower lung zone consolidation bilaterally, likely multifocal  pneumonia.  Underlying pulmonary vascular congestion. No adenopathy evident. Electronically Signed   By: Lowella Grip III M.D.   On: 09/08/2019 17:08    Assessment & Plan:  Maureen Jackson is a 63 y.o. female with a what was thought to be a left labial abscess that has been present since at least 9/26 when she came to the ED with this draining.  The area continues to drain, and I was consulted this AM to look at the patient.  She has extreme tenderness but no skin changes or bullae which made me less concerned about necrotizing infection/ fournier's but the CT scan shows extensive gas in the area.  It is difficult to know if she is starting to evolve this type of infection now or if this is just gas from the opened abscess.    -The patient is obese with BMI 55 and EF 35%. She is not a good operative candidate for [facility], and is not showing any signs of septic shock at this time, so transfer is possible. -Transfer to [facility] for further monitoring of the wound, skin checks and consideration of surgical exploration if exam worsens or changes given the CT concern and pain on exam.  -I have called and talked to [facility], [place] and have discussed with her my concerns. I have attempted to contact Dr. Brantley Stage who is on call at [facility], but he is in the OR at this time.   -Would make NPO -Continue IV antibiotic and wound add clindamycin to be safe until nec fas completely ruled out  Updated Dr. Denton Brick.  Greater than 50% of the 70 minute visit was spent in counseling/ coordination of care regarding the patient's possible fournier's gangrene. I did a bedside US, reviewed imaging and records, and reviewed CT with radiology Dr, Thornton Papas, in addition to coordinating care with [facility] as described above.   Virl Cagey 09/10/2019, 1:14 PM

## 2019-09-10 NOTE — Consult Note (Signed)
Reason for Consult: AKI Referring Physician:  Denton Brick, MD  Maureen Jackson is an 63 y.o. female.  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  SHe was in the ED 2 days prior with an abscess in her left labia and started on doxycycline and pain medications but discharged home.  In the ED she was noted to have a fever of 102.2, low BP's, and hypoxia.  She was also noted to have AKI with Scr of 1.88.  CXR was suspicious for pneumonia and she was admitted for IV antibiotics due to pneumonia and sepsis.  We were consulted due to progressive AKI with Scr rising from 1.88 on 09/08/19 to 4.13 today.  She reports no UOP for the past 3 days.  She denies any N/V/D, dysuria, pyuria, hematuria, urgency, frequency, or retention.   She was started on IV zosyn, vancomycin, azithromycin, and rocephin.  She denies any rashes, NSAIDs, or Cox-II I's.  The trend in Scr is seen below.  Trend in Creatinine: Creatinine, Ser  Date/Time Value Ref Range Status  09/10/2019 05:59 AM 4.13 (H) 0.44 - 1.00 mg/dL Final  09/08/2019 04:07 PM 1.88 (H) 0.44 - 1.00 mg/dL Final  12/12/2018 01:24 PM 0.90 0.44 - 1.00 mg/dL Final  07/18/2018 08:59 AM 1.29 (H) 0.44 - 1.00 mg/dL Final  07/17/2018 06:48 AM 1.26 (H) 0.44 - 1.00 mg/dL Final  07/16/2018 02:42 PM 0.96 0.44 - 1.00 mg/dL Final  04/15/2018 12:02 PM 0.93 0.44 - 1.00 mg/dL Final  04/01/2018 01:59 PM 1.03 (H) 0.44 - 1.00 mg/dL Final  03/14/2018 05:39 PM 0.80 0.44 - 1.00 mg/dL Final  03/14/2018 05:05 PM 0.89 0.44 - 1.00 mg/dL Final  11/14/2017 03:25 PM 1.17 (H) 0.44 - 1.00 mg/dL Final  10/30/2017 11:22 AM 0.90 0.44 - 1.00 mg/dL Final  09/26/2017 11:52 AM 1.04 (H) 0.44 - 1.00 mg/dL Final  09/13/2017 03:21 AM 1.39 (H) 0.44 - 1.00 mg/dL Final  09/12/2017 04:01 AM 1.28 (H) 0.44 - 1.00 mg/dL Final  09/11/2017  05:49 AM 1.28 (H) 0.44 - 1.00 mg/dL Final  09/10/2017 07:57 AM 1.25 (H) 0.44 - 1.00 mg/dL Final  09/10/2017 05:16 AM 1.24 (H) 0.44 - 1.00 mg/dL Final  09/09/2017 02:39 AM 1.49 (H) 0.44 - 1.00 mg/dL Final  09/07/2017 06:33 AM 1.57 (H) 0.44 - 1.00 mg/dL Final  09/06/2017 02:01 AM 1.15 (H) 0.44 - 1.00 mg/dL Final  09/05/2017 08:12 PM 1.09 (H) 0.44 - 1.00 mg/dL Final  10/02/2015 11:39 PM 0.91 0.44 - 1.00 mg/dL Final  08/05/2015 09:56 AM 0.90 0.44 - 1.00 mg/dL Final  03/05/2013 05:34 AM 0.80 0.50 - 1.10 mg/dL Final  03/04/2013 05:20 PM 0.77 0.50 - 1.10 mg/dL Final  01/30/2013 11:19 PM 0.76 0.50 - 1.10 mg/dL Final    PMH:   Past Medical History:  Diagnosis Date  . 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 Bleeding from mouth    when she brushed teeth or ate  . Chronic systolic CHF (congestive heart failure) ([place])   . Coronary artery disease 09/2017   a. multivessel CAD by cath in 09/2017 and not felt to be a CABG candidate --> underwent two-vessel PCI with DES to the LAD and DES to the RCA  . Diabetes mellitus   . Dyspnea   . Glaucoma   . Hypertension   .  Left bundle branch block   . Morbid obesity ([place])   . Obstructive sleep apnea    does not wear CPAP  . Thyroid disease     PSH:   Past Surgical History:  Procedure Laterality Date  . ABDOMINAL HYSTERECTOMY    . CARDIAC CATHETERIZATION  09/12/2017  . CORONARY STENT INTERVENTION  09/12/2017   STENT RESOLUTE ONYX 6.96V89 drug eluting stent was successfully placed  . CORONARY STENT INTERVENTION N/A 09/12/2017   Procedure: CORONARY STENT INTERVENTION;  Surgeon: Leonie Man, MD;  Location: [facility];  Service: Cardiovascular;  Laterality: N/A;  . LEFT HEART CATH AND CORONARY ANGIOGRAPHY N/A 09/12/2017   Procedure: LEFT HEART CATH AND CORONARY ANGIOGRAPHY;  Surgeon: Leonie Man, MD;  Location: [facility];  Service: Cardiovascular;  Laterality: N/A;  . MASS EXCISION N/A 12/18/2018   Procedure: EXCISION TONGUE MASS;   Surgeon: Leta Baptist, MD;  Location: [facility];  Service: ENT;  Laterality: N/A;  . RIGHT/LEFT HEART CATH AND CORONARY ANGIOGRAPHY N/A 09/10/2017   Procedure: RIGHT/LEFT HEART CATH AND CORONARY ANGIOGRAPHY;  Surgeon: Troy Sine, MD;  Location: [facility];  Service: Cardiovascular;  Laterality: N/A;  . THYROID SURGERY      Allergies:  Allergies  Allergen Reactions  . Aspirin Nausea Only    Medications:   Prior to Admission medications   Medication Sig Start Date End Date Taking? Authorizing Provider  amLODipine (NORVASC) 5 MG tablet TAKE 1 TABLET BY MOUTH EVERY DAY Patient taking differently: Take 5 mg by mouth daily.  11/12/18  Yes Strader, Fransisco Hertz, PA-C  aspirin EC 81 MG tablet Take 81 mg by mouth daily.   Yes [provider]  atorvastatin (LIPITOR) 80 MG tablet Take 1 tablet (80 mg total) by mouth every evening. 07/16/19  Yes BranchAlphonse Guild, MD  carvedilol (COREG) 25 MG tablet Take 1 tablet (25 mg total) by mouth 2 (two) times daily. 11/21/18 09/08/19 Yes BranchAlphonse Guild, MD  cetirizine (ZYRTEC) 10 MG tablet Take 10 mg by mouth daily as needed for allergies.  09/12/18  Yes [provider]  clopidogrel (PLAVIX) 75 MG tablet TAKE 1 TABLET(75 MG) BY MOUTH DAILY WITH BREAKFAST Patient taking differently: Take 75 mg by mouth every morning.  09/02/19  Yes Branch, Alphonse Guild, MD  doxycycline (VIBRAMYCIN) 100 MG capsule Take 1 capsule (100 mg total) by mouth 2 (two) times daily. 09/06/19  Yes Fredia Sorrow, MD  ENTRESTO 97-103 MG TAKE 1 TABLET BY MOUTH TWICE DAILY Patient taking differently: Take 1 tablet by mouth 2 (two) times daily.  09/02/19  Yes Branch, Alphonse Guild, MD  furosemide (LASIX) 40 MG tablet Take 1 tablet (40 mg total) by mouth daily. 09/14/17  Yes Ghimire, Henreitta Leber, MD  insulin degludec (TRESIBA) 100 UNIT/ML SOPN FlexTouch Pen Inject 0.16 mLs (16 Units total) into the skin daily. Please include needles as well for injection Patient taking differently:  Inject 50 Units into the skin daily.  07/18/18  Yes Vann, Jessica U, DO  JARDIANCE 10 MG TABS tablet Take 1 tablet by mouth daily. 05/01/19  Yes [provider]  levothyroxine (SYNTHROID, LEVOTHROID) 125 MCG tablet Take 125 mcg by mouth daily before breakfast.  08/16/18  Yes [provider]  metFORMIN (GLUCOPHAGE) 1000 MG tablet Take 1,000 mg by mouth 2 (two) times daily. 07/14/19  Yes [provider]  NOVOLOG FLEXPEN 100 UNIT/ML FlexPen Inject 15 Units into the skin 3 (three) times daily with meals.  09/02/19  Yes [provider]  ondansetron (ZOFRAN ODT) 4 MG disintegrating tablet Take 1 tablet (4 mg total) by mouth every 8 (eight) hours as needed. 09/06/19  Yes Fredia Sorrow, MD  spironolactone (ALDACTONE) 25 MG tablet TAKE 1 TABLET(25 MG) BY MOUTH DAILY Patient taking differently: Take 25 mg by mouth daily.  09/02/19  Yes Branch, Alphonse Guild, MD  timolol (TIMOPTIC) 0.5 % ophthalmic solution Place 1 drop into both eyes 2 (two) times daily. 10/07/18  Yes [provider]  VENTOLIN HFA 108 (90 Base) MCG/ACT inhaler Inhale 1-2 puffs into the lungs every 6 (six) hours as needed for wheezing or shortness of breath. 09/14/17  Yes Ghimire, Henreitta Leber, MD  HYDROcodone-acetaminophen (NORCO/VICODIN) 5-325 MG tablet Take 1 tablet by mouth every 6 (six) hours as needed for moderate pain. Patient not taking: Reported on 09/08/2019 09/06/19   Fredia Sorrow, MD  Insulin Pen Needle 32G X 4 MM MISC For use with lantus solostar-- change substitute if needed Patient not taking: Reported on 09/08/2019 07/18/18   Geradine Girt, DO  oxyCODONE-acetaminophen (PERCOCET/ROXICET) 5-325 MG tablet Take 2 tablets by mouth every 4 (four) hours as needed for severe pain. Patient not taking: Reported on 09/08/2019 05/22/19   Triplett, Lynelle Smoke, PA-C  oxyCODONE-acetaminophen (PERCOCET/ROXICET) 5-325 MG tablet Take 1 tablet by mouth every 4 (four) hours as needed. Patient not taking: Reported on  09/08/2019 05/22/19   Kem Parkinson, PA-C    Inpatient medications: . aspirin EC  81 mg Oral Daily  . atorvastatin  80 mg Oral QPM  . clopidogrel  75 mg Oral q morning - 10a  . feeding supplement (ENSURE ENLIVE)  237 mL Oral BID BM  . heparin injection (subcutaneous)  5,000 Units Subcutaneous Q8H  . insulin aspart  0-15 Units Subcutaneous TID WC  . insulin aspart  0-5 Units Subcutaneous QHS  . insulin glargine  25 Units Subcutaneous QHS  . levothyroxine  125 mcg Oral Q0600  . multivitamin with minerals  1 tablet Oral Daily  . nystatin   Topical TID  . vancomycin variable dose per unstable renal function (pharmacist dosing)   Does not apply See admin instructions    Discontinued Meds:   Medications Discontinued During This Encounter  Medication Reason  . ONETOUCH VERIO test strip Inpatient Standard  . methocarbamol (ROBAXIN) 500 MG tablet Completed Course  . isosorbide mononitrate (IMDUR) 60 MG 24 hr tablet Discontinued by provider  . insulin aspart (NOVOLOG) 100 UNIT/ML injection Duplicate  . insulin degludec (TRESIBA) 100 UNIT/ML FlexTouch Pen 25 Units   . vancomycin (VANCOCIN) 1,750 mg in sodium chloride 0.9 % 500 mL IVPB Inpatient Standard  . vancomycin (VANCOCIN) IVPB 1000 mg/200 mL premix   . vancomycin (VANCOCIN) IVPB 750 mg/150 ml premix   . vancomycin (VANCOCIN) 1,750 mg in sodium chloride 0.9 % 500 mL IVPB     Social History:  reports that she quit smoking about 13 years ago. She has never used smokeless tobacco. She reports that she does not drink alcohol or use drugs.  Family History:   Family History  Problem Relation Age of Onset  . Diabetes Mother   . Hypertension Mother   . Cancer Mother        pancreas  . Hypertension Sister     Pertinent items are noted in HPI. Weight change:   Intake/Output Summary (Last 24 hours) at 09/10/2019 1018 Last data filed at 09/10/2019 0535 Gross per 24 hour  Intake 1340 ml  Output -  Net  1340 ml   BP 111/68 (BP  Location: Right Arm)   Pulse 81   Temp 99.8 F (37.7 C) (Oral)   Resp (!) 22   Ht 5\' 5"  (1.651 m)   Wt (!) 152 kg   SpO2 94%   BMI 55.75 kg/m  Vitals:   09/09/19 1112 09/09/19 2119 09/10/19 0503 09/10/19 0513  BP: (!) 115/58 115/77 111/68   Pulse: 81 78 81   Resp: 18  (!) 22   Temp: 99.6 F (37.6 C) 99.4 F (37.4 C) 99.8 F (37.7 C)   TempSrc: Oral Oral Oral   SpO2: 94% 93% (!) 88% 94%  Weight:      Height:         General appearance: no distress and morbidly obese Head: atraumatic Resp: diminished breath sounds bilaterally Cardio: no rub and faint heart sounds GI: obese, +BS, tender to palpation especially in lower portion Extremities: extremities normal, atraumatic, no cyanosis or edema  Labs: Basic Metabolic Panel: Recent Labs  Lab 09/08/19 1607 09/10/19 0559  NA 133* 135  K 3.6 4.0  CL 100 100  CO2 23 22  GLUCOSE 268* 170*  BUN 30* 53*  CREATININE 1.88* 4.13*  ALBUMIN 2.6* 2.1*  CALCIUM 7.3* 7.0*   Liver Function Tests: Recent Labs  Lab 09/08/19 1607 09/10/19 0559  AST 33 52*  ALT 32 32  ALKPHOS 183* 164*  BILITOT 1.7* 1.0  PROT 7.2 6.7  ALBUMIN 2.6* 2.1*   No results for input(s): LIPASE, AMYLASE in the last 168 hours. No results for input(s): AMMONIA in the last 168 hours. CBC: Recent Labs  Lab 09/08/19 1607 09/10/19 0559  WBC 21.0* 17.6*  NEUTROABS 16.8*  --   HGB 9.7* 9.4*  HCT 31.0* 30.8*  MCV 87.8 89.5  PLT 222 211   PT/INR: @LABRCNTIP (inr:5) Cardiac Enzymes: )No results for input(s): CKTOTAL, CKMB, CKMBINDEX, TROPONINI in the last 168 hours. CBG: Recent Labs  Lab 09/09/19 0737 09/09/19 1118 09/09/19 1653 09/09/19 2120 09/10/19 0733  GLUCAP 231* 202* 189* 179* 148*    Iron Studies: No results for input(s): IRON, TIBC, TRANSFERRIN, FERRITIN in the last 168 hours.  Xrays/Other Studies: US Renal  Result Date: 09/10/2019 CLINICAL DATA:  Acute kidney injury. EXAM: RENAL / URINARY TRACT ULTRASOUND COMPLETE COMPARISON:   November 30, 2009 FINDINGS: Right Kidney: Renal measurements: 13.1 x 5.2 x 5.3 cm = volume: 194 mL . Echogenicity within normal limits. No mass or hydronephrosis visualized. Left Kidney: Renal measurements: 13.5 x 5 x 5.3 cm = volume: 188 mL. Echogenicity within normal limits. No mass or hydronephrosis visualized. Bladder: Not visualized. IMPRESSION: 1. Body habitus limited study. 2. Given the above limitation, no acute abnormality was detected. No hydronephrosis. Electronically Signed   By: Constance Holster M.D.   On: 09/10/2019 10:09   US Abdomen Limited  Result Date: 09/09/2019 CLINICAL DATA:  Left labia swelling. EXAM: LIMITED ULTRASOUND OF PELVIS TECHNIQUE: Limited transabdominal ultrasound examination of the pelvis was performed. COMPARISON:  None. FINDINGS: Ultrasound performed in the area of concern in the left labia. No fluid collection or other soft tissue abnormality noted. IMPRESSION: No focal fluid collection or other abnormality noted. Electronically Signed   By: Rolm Baptise M.D.   On: 09/09/2019 10:27   Dg Chest Port 1 View  Result Date: 09/08/2019 CLINICAL DATA:  Fever EXAM: PORTABLE CHEST 1 VIEW COMPARISON:  July 16, 2018 FINDINGS: There is airspace consolidation in each lower lung zone. There is cardiomegaly with pulmonary venous hypertension. No adenopathy  evident. No bone lesions. There is postoperative change in the cervicothoracic junction region. IMPRESSION: Lower lung zone consolidation bilaterally, likely multifocal pneumonia. Underlying pulmonary vascular congestion. No adenopathy evident. Electronically Signed   By: Lowella Grip III M.D.   On: 09/08/2019 17:08     Assessment/Plan: 1.  AKI- significant rise in Scr over the past 48 hours.  No evidence of obstruction with renal US but limited exam due to body habitus.  DDx includes ischemic ATN in setting of sepsis, AIN from abx, vanc toxicity (trough level is 43 today), or acute GN.  Will initiate serologic workup and  follow UOP and renal function.  No urgent indication for dialysis at this time 1. D/c vancomycin 2. Renal dose meds 3. Avoid IV contrast 4. Serologic workup underway 2. Labial abscess- continues to have purulent drainage and agree with surgery consult.  Agree with CT scan without iv contrast of pelvis to further evaluate 3. Sepsis- possibly due to pneumonia vs groin abscess (she was on jardiance) and may need urologic evaluation if this is possibly fournier's gangrene. 4. DM- poorly controlled.  Per primary svc 5. Morbid obesity- with OSA  6. CAD s/p PTA and DES on lifelong plavix 7. Chronic, combined systolic and diastolic CHF- stable   Governor Rooks Luree Palla 09/10/2019, 10:18 AM

## 2019-09-10 NOTE — Progress Notes (Signed)
Patient refusing bath this AM.  Patient checked to make sure she was dry and then adjusted in the bed.  Will pass to day shift to offer bath later in the morning.

## 2019-09-11 DIAGNOSIS — E1165 Type 2 diabetes mellitus with hyperglycemia: Secondary | ICD-10-CM

## 2019-09-11 DIAGNOSIS — I255 Ischemic cardiomyopathy: Secondary | ICD-10-CM

## 2019-09-11 DIAGNOSIS — I5042 Chronic combined systolic (congestive) and diastolic (congestive) heart failure: Secondary | ICD-10-CM

## 2019-09-11 DIAGNOSIS — I10 Essential (primary) hypertension: Secondary | ICD-10-CM

## 2019-09-11 DIAGNOSIS — I2510 Atherosclerotic heart disease of native coronary artery without angina pectoris: Secondary | ICD-10-CM

## 2019-09-11 LAB — CBC WITH DIFFERENTIAL/PLATELET
Abs Immature Granulocytes: 0.41 10*3/uL — ABNORMAL HIGH (ref 0.00–0.07)
Basophils Absolute: 0 10*3/uL (ref 0.0–0.1)
Basophils Relative: 0 %
Eosinophils Absolute: 0.4 10*3/uL (ref 0.0–0.5)
Eosinophils Relative: 2 %
HCT: 27.4 % — ABNORMAL LOW (ref 36.0–46.0)
Hemoglobin: 8.8 g/dL — ABNORMAL LOW (ref 12.0–15.0)
Immature Granulocytes: 2 %
Lymphocytes Relative: 16 %
Lymphs Abs: 2.9 10*3/uL (ref 0.7–4.0)
MCH: 27.8 pg (ref 26.0–34.0)
MCHC: 32.1 g/dL (ref 30.0–36.0)
MCV: 86.7 fL (ref 80.0–100.0)
Monocytes Absolute: 1.5 10*3/uL — ABNORMAL HIGH (ref 0.1–1.0)
Monocytes Relative: 8 %
Neutro Abs: 12.9 10*3/uL — ABNORMAL HIGH (ref 1.7–7.7)
Neutrophils Relative %: 72 %
Platelets: 244 10*3/uL (ref 150–400)
RBC: 3.16 MIL/uL — ABNORMAL LOW (ref 3.87–5.11)
RDW: 14 % (ref 11.5–15.5)
WBC: 18.1 10*3/uL — ABNORMAL HIGH (ref 4.0–10.5)
nRBC: 0 % (ref 0.0–0.2)

## 2019-09-11 LAB — PROTEIN ELECTROPHORESIS, SERUM
A/G Ratio: 0.5 — ABNORMAL LOW (ref 0.7–1.7)
Albumin ELP: 1.8 g/dL — ABNORMAL LOW (ref 2.9–4.4)
Alpha-1-Globulin: 0.5 g/dL — ABNORMAL HIGH (ref 0.0–0.4)
Alpha-2-Globulin: 1.1 g/dL — ABNORMAL HIGH (ref 0.4–1.0)
Beta Globulin: 0.9 g/dL (ref 0.7–1.3)
Gamma Globulin: 1.5 g/dL (ref 0.4–1.8)
Globulin, Total: 3.9 g/dL (ref 2.2–3.9)
M-Spike, %: 0.5 g/dL — ABNORMAL HIGH
Total Protein ELP: 5.7 g/dL — ABNORMAL LOW (ref 6.0–8.5)

## 2019-09-11 LAB — COMPREHENSIVE METABOLIC PANEL
ALT: 42 U/L (ref 0–44)
AST: 73 U/L — ABNORMAL HIGH (ref 15–41)
Albumin: 1.6 g/dL — ABNORMAL LOW (ref 3.5–5.0)
Alkaline Phosphatase: 166 U/L — ABNORMAL HIGH (ref 38–126)
Anion gap: 15 (ref 5–15)
BUN: 64 mg/dL — ABNORMAL HIGH (ref 8–23)
CO2: 17 mmol/L — ABNORMAL LOW (ref 22–32)
Calcium: 6.7 mg/dL — ABNORMAL LOW (ref 8.9–10.3)
Chloride: 100 mmol/L (ref 98–111)
Creatinine, Ser: 5.91 mg/dL — ABNORMAL HIGH (ref 0.44–1.00)
GFR calc Af Amer: 8 mL/min — ABNORMAL LOW (ref >60)
GFR calc non Af Amer: 7 mL/min — ABNORMAL LOW (ref >60)
Glucose, Bld: 104 mg/dL — ABNORMAL HIGH (ref 70–99)
Potassium: 4.4 mmol/L (ref 3.5–5.1)
Sodium: 132 mmol/L — ABNORMAL LOW (ref 135–145)
Total Bilirubin: 1 mg/dL (ref 0.3–1.2)
Total Protein: 6.2 g/dL — ABNORMAL LOW (ref 6.5–8.1)

## 2019-09-11 LAB — GLUCOSE, CAPILLARY
Glucose-Capillary: 102 mg/dL — ABNORMAL HIGH (ref 70–99)
Glucose-Capillary: 96 mg/dL (ref 70–99)
Glucose-Capillary: 91 mg/dL (ref 70–99)
Glucose-Capillary: 83 mg/dL (ref 70–99)

## 2019-09-11 LAB — ANTISTREPTOLYSIN O TITER: ASO: <20 IU/mL (ref 0.0–200.0)

## 2019-09-11 LAB — KAPPA/LAMBDA LIGHT CHAINS
Kappa free light chain: 131.5 mg/L — ABNORMAL HIGH (ref 3.3–19.4)
Kappa, lambda light chain ratio: 0.46 (ref 0.26–1.65)
Lambda free light chains: 287.1 mg/L — ABNORMAL HIGH (ref 5.7–26.3)

## 2019-09-11 LAB — C3 COMPLEMENT: C3 Complement: 187 mg/dL — ABNORMAL HIGH (ref 82–167)

## 2019-09-11 LAB — ANTI-DNA ANTIBODY, DOUBLE-STRANDED: ds DNA Ab: <1 IU/mL (ref 0–9)

## 2019-09-11 LAB — COMPLEMENT, TOTAL: Compl, Total (CH50): >60 U/mL (ref >41)

## 2019-09-11 LAB — C4 COMPLEMENT: Complement C4, Body Fluid: 35 mg/dL (ref 14–44)

## 2019-09-11 MED ORDER — SODIUM BICARBONATE 650 MG PO TABS
650.0000 mg | ORAL_TABLET | Freq: Three times a day (TID) | ORAL | Status: DC
  Administered 2019-09-11 – 2019-09-15 (×12): 650 mg via ORAL
  Filled 2019-09-11 (×16): qty 1

## 2019-09-11 MED ORDER — CHLORHEXIDINE GLUCONATE CLOTH 2 % EX PADS
6.0000 | MEDICATED_PAD | Freq: Every day | CUTANEOUS | Status: DC
  Administered 2019-09-11 – 2019-09-20 (×10): 6 via TOPICAL

## 2019-09-11 NOTE — Progress Notes (Signed)
Pt transfer from [facility] to [phone] via stretcher with [facility]. Pt is a/o x4, reported that pt is having some confusion. Satting 85% in RA, placed on 2 L [place] [place] sat went up to 97%. Bed on lowest position. Call bell with in reach. Will continue to monitor.

## 2019-09-11 NOTE — Consult Note (Signed)
[facility] Surgery Consult Note  Maureen Jackson 1956-01-26  [id number].    Requesting MD: Nicolette Bang Chief Complaint/Reason for Consult: labial abscess  HPI:  Maureen Jackson is a 63yo female PMH MO, DM, CAD with cardiac stents in place on plavix, hypothyroidism, HLD, and HF with EF 35-40%, who was transferred from [facility] to [facility] yesterday with concerns of labial abscess and possible necrotizing fascitis. Patient was admitted to [facility] 9/28 with sepsis and found to have pneumonia, UTI, and left labial abscess abscess. She was started on IV azithromycin, rocephin, and clindamycin. Vancomycin stopped due to worsening renal failure. Blood cultures NGTD. CT scan was obtained yesterday and reports extensive inflammatory/infectious process involving the mons pubis, labia, perineum and suprapubic regions with extensive gas formation worrisome for Fournier's gangrene; no discrete drainable soft tissue abscess. WBC is trending down 17.6, TMAX 100.2.  Patient states that the abscess appeared about 1 week ago. It started draining 2 days ago. Patient was transferred to [facility] for evaluation by general surgery. She was felt to be a poor surgical candidate at [facility] and was transferred here in case surgical management is required.  Lives at home with her daughter Ambulates some with a cane   Review of Systems  Constitutional: Positive for malaise/fatigue. Negative for chills and fever.  HENT: Negative.   Eyes: Negative.   Respiratory: Positive for cough and shortness of breath. Negative for wheezing.   Cardiovascular: Negative.   Gastrointestinal: Negative.  Negative for abdominal pain, constipation, diarrhea, nausea and vomiting.  Genitourinary: Negative.   Musculoskeletal: Positive for joint pain.  Skin: Negative.        Labial abscess  Neurological: Negative.     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 multivessel CAD by cath in 09/2017 and not felt to be a CABG candidate --> underwent two-vessel PCI with DES to the LAD and DES to the RCA   Diabetes mellitus    Dyspnea    Glaucoma    Hypertension    Left bundle branch block    Morbid obesity ([place])    Obstructive sleep apnea    does not wear CPAP   Thyroid disease     Past Surgical History:  Procedure Laterality Date   ABDOMINAL HYSTERECTOMY     CARDIAC CATHETERIZATION  09/12/2017   CORONARY STENT INTERVENTION  09/12/2017   STENT RESOLUTE ONYX 5.42H06 drug eluting stent was successfully placed   CORONARY STENT INTERVENTION N/A 09/12/2017   Procedure: CORONARY STENT INTERVENTION;  Surgeon: Leonie Man, MD;  Location: [facility];  Service: Cardiovascular;  Laterality: N/A;   LEFT HEART CATH AND CORONARY ANGIOGRAPHY N/A 09/12/2017   Procedure: LEFT HEART CATH AND CORONARY ANGIOGRAPHY;  Surgeon: Leonie Man, MD;  Location: [place] [facility];  Service: Cardiovascular;  Laterality: N/A;   MASS EXCISION N/A 12/18/2018   Procedure: EXCISION TONGUE MASS;  Surgeon: Leta Baptist, MD;  Location: [place];  Service:  ENT;  Laterality: N/A;   RIGHT/LEFT HEART CATH AND CORONARY ANGIOGRAPHY N/A 09/10/2017   Procedure: RIGHT/LEFT HEART CATH AND CORONARY ANGIOGRAPHY;  Surgeon: Troy Sine, MD;  Location: [place] [facility];  Service: Cardiovascular;  Laterality: N/A;   THYROID SURGERY      Social History:  reports that she quit smoking about 13 years ago. She has never used smokeless tobacco. She reports that she does not drink alcohol or use drugs.  Allergies:    Allergies  Allergen Reactions   Aspirin Nausea Only    Medications Prior to Admission  Medication Sig Dispense Refill   amLODipine (NORVASC) 5 MG tablet TAKE 1 TABLET BY MOUTH EVERY DAY (Patient taking differently: Take 5 mg by mouth daily. ) 90 tablet 3   aspirin EC 81 MG tablet Take 81 mg by mouth daily.     atorvastatin (LIPITOR) 80 MG tablet Take 1 tablet (80 mg total) by mouth every evening. 90 tablet 3   carvedilol (COREG) 25 MG tablet Take 1 tablet (25 mg total) by mouth 2 (two) times daily. 180 tablet 3   cetirizine (ZYRTEC) 10 MG tablet Take 10 mg by mouth daily as needed for allergies.   0   clopidogrel (PLAVIX) 75 MG tablet TAKE 1 TABLET(75 MG) BY MOUTH DAILY WITH BREAKFAST (Patient taking differently: Take 75 mg by mouth every morning. ) 90 tablet 3   doxycycline (VIBRAMYCIN) 100 MG capsule Take 1 capsule (100 mg total) by mouth 2 (two) times daily. 14 capsule 0   ENTRESTO 97-103 MG TAKE 1 TABLET BY MOUTH TWICE DAILY (Patient taking differently: Take 1 tablet by mouth 2 (two) times daily. ) 180 tablet 3   furosemide (LASIX) 40 MG tablet Take 1 tablet (40 mg total) by mouth daily. 30 tablet 0   insulin degludec (TRESIBA) 100 UNIT/ML SOPN FlexTouch Pen Inject 0.16 mLs (16 Units total) into the skin daily. Please include needles as well for injection (Patient taking differently: Inject 50 Units into the skin daily. ) 2 pen 0   JARDIANCE 10 MG TABS tablet Take 1 tablet by mouth daily.     levothyroxine (SYNTHROID, LEVOTHROID) 125 MCG tablet Take 125 mcg by mouth daily before breakfast.   0   metFORMIN (GLUCOPHAGE) 1000 MG tablet Take 1,000 mg by mouth 2 (two) times daily.     NOVOLOG FLEXPEN 100 UNIT/ML FlexPen Inject 15 Units into the skin 3 (three) times daily with meals.      ondansetron (ZOFRAN ODT) 4 MG disintegrating tablet Take 1 tablet (4 mg total) by mouth every 8 (eight) hours as needed. 10 tablet 1   spironolactone (ALDACTONE) 25 MG tablet TAKE 1 TABLET(25  MG) BY MOUTH DAILY (Patient taking differently: Take 25 mg by mouth daily. ) 90 tablet 3   timolol (TIMOPTIC) 0.5 % ophthalmic solution Place 1 drop into both eyes 2 (two) times daily.  5   VENTOLIN HFA 108 (90 Base) MCG/ACT inhaler Inhale 1-2 puffs into the lungs every 6 (six) hours as needed for wheezing or shortness of breath. 1 Inhaler 0   HYDROcodone-acetaminophen (NORCO/VICODIN) 5-325 MG tablet Take 1 tablet by mouth every 6 (six) hours as needed for moderate pain. (Patient not taking: Reported on 09/08/2019) 14 tablet 0   Insulin Pen Needle 32G X 4 MM MISC For use with lantus solostar-- change substitute if needed (Patient not taking: Reported on 09/08/2019) 100 each 0   oxyCODONE-acetaminophen (PERCOCET/ROXICET) 5-325 MG tablet Take 2 tablets by mouth every 4 (four)  hours as needed for severe pain. (Patient not taking: Reported on 09/08/2019) 6 tablet 0   oxyCODONE-acetaminophen (PERCOCET/ROXICET) 5-325 MG tablet Take 1 tablet by mouth every 4 (four) hours as needed. (Patient not taking: Reported on 09/08/2019) 8 tablet 0    Prior to Admission medications   Medication Sig Start Date End Date Taking? Authorizing Provider  amLODipine (NORVASC) 5 MG tablet TAKE 1 TABLET BY MOUTH EVERY DAY Patient taking differently: Take 5 mg by mouth daily.  11/12/18  Yes Strader, Fransisco Hertz, PA-C  aspirin EC 81 MG tablet Take 81 mg by mouth daily.   Yes [provider]  atorvastatin (LIPITOR) 80 MG tablet Take 1 tablet (80 mg total) by mouth every evening. 07/16/19  Yes BranchAlphonse Guild, MD  carvedilol (COREG) 25 MG tablet Take 1 tablet (25 mg total) by mouth 2 (two) times daily. 11/21/18 09/08/19 Yes BranchAlphonse Guild, MD  cetirizine (ZYRTEC) 10 MG tablet Take 10 mg by mouth daily as needed for allergies.  09/12/18  Yes [provider]  clopidogrel (PLAVIX) 75 MG tablet TAKE 1 TABLET(75 MG) BY MOUTH DAILY WITH BREAKFAST Patient taking differently: Take 75 mg by mouth every morning.   09/02/19  Yes Branch, Alphonse Guild, MD  doxycycline (VIBRAMYCIN) 100 MG capsule Take 1 capsule (100 mg total) by mouth 2 (two) times daily. 09/06/19  Yes Fredia Sorrow, MD  ENTRESTO 97-103 MG TAKE 1 TABLET BY MOUTH TWICE DAILY Patient taking differently: Take 1 tablet by mouth 2 (two) times daily.  09/02/19  Yes Branch, Alphonse Guild, MD  furosemide (LASIX) 40 MG tablet Take 1 tablet (40 mg total) by mouth daily. 09/14/17  Yes Ghimire, Henreitta Leber, MD  insulin degludec (TRESIBA) 100 UNIT/ML SOPN FlexTouch Pen Inject 0.16 mLs (16 Units total) into the skin daily. Please include needles as well for injection Patient taking differently: Inject 50 Units into the skin daily.  07/18/18  Yes Vann, Jessica U, DO  JARDIANCE 10 MG TABS tablet Take 1 tablet by mouth daily. 05/01/19  Yes [provider]  levothyroxine (SYNTHROID, LEVOTHROID) 125 MCG tablet Take 125 mcg by mouth daily before breakfast.  08/16/18  Yes [provider]  metFORMIN (GLUCOPHAGE) 1000 MG tablet Take 1,000 mg by mouth 2 (two) times daily. 07/14/19  Yes [provider]  NOVOLOG FLEXPEN 100 UNIT/ML FlexPen Inject 15 Units into the skin 3 (three) times daily with meals.  09/02/19  Yes [provider]  ondansetron (ZOFRAN ODT) 4 MG disintegrating tablet Take 1 tablet (4 mg total) by mouth every 8 (eight) hours as needed. 09/06/19  Yes Fredia Sorrow, MD  spironolactone (ALDACTONE) 25 MG tablet TAKE 1 TABLET(25 MG) BY MOUTH DAILY Patient taking differently: Take 25 mg by mouth daily.  09/02/19  Yes Branch, Alphonse Guild, MD  timolol (TIMOPTIC) 0.5 % ophthalmic solution Place 1 drop into both eyes 2 (two) times daily. 10/07/18  Yes [provider]  VENTOLIN HFA 108 (90 Base) MCG/ACT inhaler Inhale 1-2 puffs into the lungs every 6 (six) hours as needed for wheezing or shortness of breath. 09/14/17  Yes Ghimire, Henreitta Leber, MD  HYDROcodone-acetaminophen (NORCO/VICODIN) 5-325 MG tablet Take 1 tablet by mouth every 6 (six)  hours as needed for moderate pain. Patient not taking: Reported on 09/08/2019 09/06/19   Fredia Sorrow, MD  Insulin Pen Needle 32G X 4 MM MISC For use with lantus solostar-- change substitute if needed Patient not taking: Reported on 09/08/2019 07/18/18   Geradine Girt, DO  oxyCODONE-acetaminophen (  PERCOCET/ROXICET) 5-325 MG tablet Take 2 tablets by mouth every 4 (four) hours as needed for severe pain. Patient not taking: Reported on 09/08/2019 05/22/19   Triplett, Lynelle Smoke, PA-C  oxyCODONE-acetaminophen (PERCOCET/ROXICET) 5-325 MG tablet Take 1 tablet by mouth every 4 (four) hours as needed. Patient not taking: Reported on 09/08/2019 05/22/19   Kem Parkinson, PA-C    Blood pressure (!) 98/58, pulse 75, temperature 99.3 F (37.4 C), temperature source Oral, resp. rate 17, height 5\' 5"  (1.651 m), weight (!) 152 kg, SpO2 91 %. Physical Exam: General: obese AA female who is laying in bed in NAD HEENT: head is normocephalic, atraumatic.  Sclera are noninjected.  Pupils equal and round.  Ears and nose without any masses or lesions.  Mouth is pink and moist. Dentition fair Heart: regular, rate, and rhythm.  No obvious murmurs, gallops, or rubs noted.  Palpable pedal pulses bilaterally Lungs: CTAB, no wheezes, rhonchi, or rales noted.  Respiratory effort nonlabored on 2L [place] Abd: obese, soft, NT/ND, +BS, no masses, hernias, or organomegaly MS: calves soft and nontender Skin: warm and dry  Psych: A&Ox3 with an appropriate affect. Neuro: moving all 4 extremities GU: left labial abscess with 1cm opening and active purulent drainage, mild surrounding erythema and tenderness around opening and up into mons pubis but the skin is soft and without crepitus     Results for orders placed or performed during the hospital encounter of 09/08/19 (from the past 48 hour(s))  Glucose, capillary     Status: Abnormal   Collection Time: 09/09/19 11:18 AM  Result Value Ref Range   Glucose-Capillary 202 (H) 70 - 99  mg/dL  Glucose, capillary     Status: Abnormal   Collection Time: 09/09/19  4:53 PM  Result Value Ref Range   Glucose-Capillary 189 (H) 70 - 99 mg/dL  Glucose, capillary     Status: Abnormal   Collection Time: 09/09/19  9:20 PM  Result Value Ref Range   Glucose-Capillary 179 (H) 70 - 99 mg/dL   Comment 1 Notify RN    Comment 2 Document in Chart   CBC     Status: Abnormal   Collection Time: 09/10/19  5:59 AM  Result Value Ref Range   WBC 17.6 (H) 4.0 - 10.5 K/uL   RBC 3.44 (L) 3.87 - 5.11 MIL/uL   Hemoglobin 9.4 (L) 12.0 - 15.0 g/dL   HCT 30.8 (L) 36.0 - 46.0 %   MCV 89.5 80.0 - 100.0 fL   MCH 27.3 26.0 - 34.0 pg   MCHC 30.5 30.0 - 36.0 g/dL   RDW 13.8 11.5 - 15.5 %   Platelets 211 150 - 400 K/uL   nRBC 0.0 0.0 - 0.2 %    Comment: Performed at [facility], [address] [place], [place] [address]  Comprehensive metabolic panel     Status: Abnormal   Collection Time: 09/10/19  5:59 AM  Result Value Ref Range   Sodium 135 135 - 145 mmol/L   Potassium 4.0 3.5 - 5.1 mmol/L   Chloride 100 98 - 111 mmol/L   CO2 22 22 - 32 mmol/L   Glucose, Bld 170 (H) 70 - 99 mg/dL   BUN 53 (H) 8 - 23 mg/dL   Creatinine, Ser 4.13 (H) 0.44 - 1.00 mg/dL   Calcium 7.0 (L) 8.9 - 10.3 mg/dL   Total Protein 6.7 6.5 - 8.1 g/dL   Albumin 2.1 (L) 3.5 - 5.0 g/dL   AST 52 (H) 15 - 41 U/L  ALT 32 0 - 44 U/L   Alkaline Phosphatase 164 (H) 38 - 126 U/L   Total Bilirubin 1.0 0.3 - 1.2 mg/dL   GFR calc non Af Amer 11 (L) >60 mL/min   GFR calc Af Amer 13 (L) >60 mL/min   Anion gap 13 5 - 15    Comment: Performed at [facility], [address], [place], [place] [address]  Glucose, capillary     Status: Abnormal   Collection Time: 09/10/19  7:33 AM  Result Value Ref Range   Glucose-Capillary 148 (H) 70 - 99 mg/dL   Comment 1 Notify RN    Comment 2 Document in Chart   Vancomycin, random     Status: None   Collection Time: 09/10/19  8:45 AM  Result Value Ref Range   Vancomycin Rm 43     Comment:          Random Vancomycin therapeutic range is dependent on dosage and time of specimen collection. A peak range is 20.0-40.0 ug/mL A trough range is 5.0-15.0 ug/mL        Performed at [facility], [address], [place], [place] [address]   CK     Status: Abnormal   Collection Time: 09/10/19 10:54 AM  Result Value Ref Range   Total CK 732 (H) 38 - 234 U/L    Comment: Performed at [facility], [address] [place], [place] [address]  C3 complement     Status: Abnormal   Collection Time: 09/10/19 10:54 AM  Result Value Ref Range   C3 Complement 187 (H) 82 - 167 mg/dL    Comment: (NOTE) Performed At: [facility] [place], [place] [id number] Rush Farmer MD AG:[id number]   C4 complement     Status: None   Collection Time: 09/10/19 10:54 AM  Result Value Ref Range   Complement C4, Body Fluid 35 14 - 44 mg/dL    Comment: (NOTE) Performed At: [facility] [place], [place] [id number] Rush Farmer MD [id number]   Complement, total     Status: None   Collection Time: 09/10/19 10:54 AM  Result Value Ref Range   Compl, Total (CH50) >60 >41 U/mL    Comment: (NOTE)             Age                Female          Female          1 - 30 days         Not Estab.     Not Estab.    31 days -  6 months          >32            >20   7 months - 17 years           >39            >39             >17 years           >41            >41 **NOTE: The adult (">17 years") reference interval**         range is used to flag abnormals on this         report. If the patient is 38 years old or         younger, use  the table above to determine         out of range values. Performed At: [facility] [place], [place] [id number] Rush Farmer MD PJ:[id number]   Antistreptolysin O titer     Status: None   Collection Time: 09/10/19 10:54 AM  Result Value Ref Range   ASO <20.0 0.0 - 200.0 IU/mL    Comment: (NOTE) Performed At: [facility] [place], [place] [id number] Rush Farmer MD IO:[id number]   Glucose, capillary     Status: Abnormal   Collection Time: 09/10/19 11:57 AM  Result Value Ref Range   Glucose-Capillary 144 (H) 70 - 99 mg/dL   Comment 1 Notify RN    Comment 2 Document in Chart   Glucose, capillary     Status: Abnormal   Collection Time: 09/10/19  4:41 PM  Result Value Ref Range   Glucose-Capillary 140 (H) 70 - 99 mg/dL   Comment 1 Notify RN    Comment 2 Document in Chart   Glucose, capillary     Status: Abnormal   Collection Time: 09/10/19  9:11 PM  Result Value Ref Range   Glucose-Capillary 141 (H) 70 - 99 mg/dL   Comment 1 Notify RN    Comment 2 Document in Chart    Ct Abdomen Pelvis Wo Contrast  Result Date: 09/10/2019 CLINICAL DATA:  Left-sided labial swelling for 3-4 days. Possible abscess. EXAM: CT ABDOMEN AND PELVIS WITHOUT CONTRAST TECHNIQUE: Multidetector CT imaging of the abdomen and pelvis was performed following the standard protocol without IV contrast. COMPARISON:  None. FINDINGS: Lower chest: The heart is enlarged and there is a small pericardial effusion. Age advanced coronary artery calcifications are noted. Small bilateral pleural effusions with overlying atelectasis are noted. Hepatobiliary: No focal hepatic lesions are identified without contrast. Moderate respiratory motion to the liver limits evaluation. The gallbladder is grossly normal. No obvious common bile duct dilatation. Pancreas: Limited examination due to motion artifact but no gross abnormality. Spleen: Normal size.  No focal lesions. Adrenals/Urinary Tract: The adrenal glands are grossly normal. No obvious renal calculi, hydronephrosis or mass. The bladder is grossly normal. Stomach/Bowel: Limited evaluation due to motion artifact. No gross bowel abnormality or obstructive findings. The terminal ileum and appendix appear normal. Vascular/Lymphatic: Moderate atherosclerotic calcifications  involving the aorta and iliac arteries but no aneurysm. Scattered mesenteric and retroperitoneal lymph nodes without mass or overt adenopathy. Reproductive: Surgically absent. Other: Marked diffuse subcutaneous soft tissue swelling/edema/fluid and gas in the region of the mons pubis, left labia, perineum and suprapubic region. I do not see a discrete drainable fluid collection or fistula. No intrapelvic component is identified. Findings worrisome for Fournier gangrene. Adjacent inflamed/enlarged inguinal lymph nodes are noted. Musculoskeletal: No significant bony findings. I do not see any evidence of septic arthritis or osteomyelitis. IMPRESSION: 1. Extensive inflammatory/infectious process involving the mons pubis, labia, perineum and suprapubic regions with extensive gas formation. Findings worrisome for Fournier's gangrene. No discrete drainable soft tissue abscess. 2. No intra-abdominal/intrapelvic abscess or obvious inflammatory process. 3. Small bilateral pleural effusions with overlying atelectasis. These results will be called to the ordering clinician or representative by the Radiologist Assistant, and communication documented in the PACS or zVision Dashboard. Electronically Signed   By: Marijo Sanes M.D.   On: 09/10/2019 12:05   US Renal  Result Date: 09/10/2019 CLINICAL DATA:  Acute kidney injury. EXAM: RENAL / URINARY TRACT ULTRASOUND COMPLETE COMPARISON:  November 30, 2009 FINDINGS: Right Kidney: Renal measurements: 13.1 x 5.2  x 5.3 cm = volume: 194 mL . Echogenicity within normal limits. No mass or hydronephrosis visualized. Left Kidney: Renal measurements: 13.5 x 5 x 5.3 cm = volume: 188 mL. Echogenicity within normal limits. No mass or hydronephrosis visualized. Bladder: Not visualized. IMPRESSION: 1. Body habitus limited study. 2. Given the above limitation, no acute abnormality was detected. No hydronephrosis. Electronically Signed   By: Constance Holster M.D.   On: 09/10/2019 10:09   US  Abdomen Limited  Result Date: 09/09/2019 CLINICAL DATA:  Left labia swelling. EXAM: LIMITED ULTRASOUND OF PELVIS TECHNIQUE: Limited transabdominal ultrasound examination of the pelvis was performed. COMPARISON:  None. FINDINGS: Ultrasound performed in the area of concern in the left labia. No fluid collection or other soft tissue abnormality noted. IMPRESSION: No focal fluid collection or other abnormality noted. Electronically Signed   By: Rolm Baptise M.D.   On: 09/09/2019 10:27    Anti-infectives (From admission, onward)   Start     Dose/Rate Route Frequency Ordered Stop   09/10/19 1430  clindamycin (CLEOCIN) IVPB 600 mg     600 mg 100 mL/hr over 30 Minutes Intravenous Every 6 hours 09/10/19 1410     09/10/19 0742  vancomycin variable dose per unstable renal function (pharmacist dosing)  Status:  Discontinued      Does not apply See admin instructions 09/10/19 0742 09/10/19 1413   09/10/19 0000  vancomycin (VANCOCIN) 1,750 mg in sodium chloride 0.9 % 500 mL IVPB  Status:  Discontinued     1,750 mg 250 mL/hr over 120 Minutes Intravenous Every 24 hours 09/09/19 0912 09/09/19 0928   09/10/19 0000  vancomycin (VANCOCIN) 1,750 mg in sodium chloride 0.9 % 500 mL IVPB  Status:  Discontinued     1,750 mg 250 mL/hr over 120 Minutes Intravenous Every 24 hours 09/09/19 0940 09/10/19 0751   09/09/19 1030  vancomycin (VANCOCIN) IVPB 750 mg/150 ml premix  Status:  Discontinued     750 mg 150 mL/hr over 60 Minutes Intravenous  Once 09/09/19 0928 09/09/19 0938   09/09/19 0930  vancomycin (VANCOCIN) IVPB 1000 mg/200 mL premix  Status:  Discontinued     1,000 mg 200 mL/hr over 60 Minutes Intravenous  Once 09/09/19 0928 09/09/19 0938   09/08/19 2300  azithromycin (ZITHROMAX) 500 mg in sodium chloride 0.9 % 250 mL IVPB     500 mg 250 mL/hr over 60 Minutes Intravenous Every 24 hours 09/08/19 2230     09/08/19 2300  cefTRIAXone (ROCEPHIN) 2 g in sodium chloride 0.9 % 100 mL IVPB     2 g 200 mL/hr over 30  Minutes Intravenous Every 24 hours 09/08/19 2230     09/08/19 2300  vancomycin (VANCOCIN) 1,500 mg in sodium chloride 0.9 % 500 mL IVPB     1,500 mg 250 mL/hr over 120 Minutes Intravenous  Once 09/08/19 2254 09/09/19 0434   09/08/19 1600  vancomycin (VANCOCIN) IVPB 1000 mg/200 mL premix     1,000 mg 200 mL/hr over 60 Minutes Intravenous  Once 09/08/19 1558 09/08/19 1929   09/08/19 1600  piperacillin-tazobactam (ZOSYN) IVPB 3.375 g     3.375 g 100 mL/hr over 30 Minutes Intravenous  Once 09/08/19 1558 09/08/19 1655       Assessment/Plan MO DM CAD with cardiac stents in place on plavix Hypothyroidism HLD HF with EF 35-40%  Left labial abscess - CT scan from 9/30 shows extensive inflammatory/infectious process involving the mons pubis, labia, perineum and suprapubic regions with extensive gas formation worrisome for Fournier's  gangrene; no discrete drainable soft tissue abscess - Currently the wound looks like it is open and draining. She is stable and her WBC is trending down. I think we can continue antibiotics and wound care and monitor. Will review with MD.  ID - currently rocephin 9/28>>, azithromycin 9/28>>, clindamycin 9/30>> VTE - SCDs, sq heparin FEN - IVF, NPO Foley - in place Follow up - TBD   Wellington Hampshire, [facility] Surgery 09/11/2019, 7:59 AM Pager: [phone] Mon-Thurs 7:00 am-4:30 pm Fri 7:00 am -11:30 AM Sat-Sun 7:00 am-11:30 am

## 2019-09-11 NOTE — Plan of Care (Signed)
  Problem: Education: Goal: Knowledge of General Education information will improve Description: Including pain rating scale, medication(s)/side effects and non-pharmacologic comfort measures Outcome: Progressing   Problem: Clinical Measurements: Goal: Will remain free from infection Outcome: Progressing   Problem: Elimination: Goal: Will not experience complications related to urinary retention Outcome: Progressing   Problem: Pain Managment: Goal: General experience of comfort will improve Outcome: Progressing   Problem: Safety: Goal: Ability to remain free from injury will improve Outcome: Progressing   Problem: Skin Integrity: Goal: Risk for impaired skin integrity will decrease Outcome: Progressing

## 2019-09-11 NOTE — Progress Notes (Signed)
PROGRESS NOTE    Maureen Jackson  WER:[id number] DOB: [birth year] DOA: 09/08/2019 PCP: Neale Burly, MD   Brief Narrative:  63 y.o.femalewith medical history significant formorbid obesity (BMI > 55), ischemic and myopathy with combined systolic and diastolic diastolic CHF, hypothyroidism, hypertension,  OSA, coronary artery disease, prior TIA admitted on 09/08/2019 with sepsis secondary to pneumonia and left groin abscess, patient has developed significant AKI with oliguria, also there are concerns about possible Fournier's gangrene of the perineum -Transfer from [facility] to [facility] -Dr Brantley Stage (general surgery to see)   Assessment & Plan:   Principal Problem:   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  -Seen by general surgery in consultation no acute intervention felt to be necessary, continue IV antibiotics wound is draining they will follow closely also -Treat empirically with IV Rocephin and IV clindamycin PTA patient was on Jardiance, SGLT2-inhibitor have been associated with Fournier's gangrene  2)Sepsis secondary to Pneumonia and Perineal Abscess/Fournier's gangrene--patient met sepsis criteria on admission --Fever curve is trending down, -WBC is 18.1 up from 17.6 yesterday  --Continue IV azithromycin, Rocephin and clindamycin, - IV vancomycin discontinued on 09/10/2019 due to worsening renal function -Random vancomycin level was 43  3)AKI----acute kidney injury due to sepsis/atn and possible  vancomycin toxicity -Random vancomycin level was 43 -Seen by nephrology in consultation appreciate their input continue to follow4)HFrEF/ischemic cardiomyopathy- history of combined diastolic and systolic dysfunction CHF----- c/n  Coreg,   hold Lasix and Aldactone on hold due to soft BP and worsening renal function -stable and asymptomatic at this time -Hold Entresto given worsening renal function -- echo 12/2018  LVEF 30-35%, grade I diastoilc dysfunction -  5)CAD--asymptomatic at this time, chest pain-free -LHC- 09/2017 found to have severe multivessel disease  -Received DES to LAD and DES x 2 to RCA, LCX disease managed medically. Recs for lifelong plavix per interventional cards. Has been on high dose ASA per neurology (h/o TIA) -Seen by CT surgery, recs were for PCI as opposed to CABG. continue aspirin and Plavix, continue Lipitor,  6)DM2- A1C was 10.2 , reflecting uncontrolled diabetes- -Lantus insulin 25 units nightly, Use Novolog/Humalog Sliding scale insulin with Accu-Cheks/Fingersticks as ordered -PTA patient was on Jardiance, SGLT2-inhibitor have been associated with Fournier's gangrene   7)Morbid Obesity and OSA--- BMI > 55, This complicates overall care- continue CPAP nightly  DVT prophylaxis: Heparin SQ  Code Status: full Code Status History    Date Active Date Inactive Code Status Order ID Comments User Context   07/16/2018 1928 07/18/2018 2019 Full Code [id number]  Ivor Costa, MD ED   03/14/2018 2016 03/15/2018 2202 Full Code [id number]  Karmen Bongo, MD Inpatient   09/05/2017 2316 09/14/2017 1354 Full Code [id number]  Karmen Bongo, MD Inpatient   03/04/2013 2340 03/05/2013 1615 Full Code [id number]  Karlyn Agee, MD Inpatient   Advance Care Planning Activity     Family Communication: Discussed with sister at bedside today Disposition Plan:   Patient remained inpatient for continued IV antibiotics, frequent monitoring of electrolytes and wound, expert subspecialty  consultation, and daily labs.  Without these treatments patient risk of severe life-threatening clinical deterioration Consults called: nephrology Admission status: Inpatient   Consultants:   nephrology and gen surg  Procedures:  Ct Abdomen Pelvis Wo Contrast  Result Date: 09/10/2019 CLINICAL DATA:  Left-sided labial swelling for 3-4 days. Possible abscess. EXAM: CT ABDOMEN AND PELVIS WITHOUT CONTRAST TECHNIQUE: Multidetector CT imaging of the abdomen and pelvis was performed following the standard protocol without IV contrast. COMPARISON:  None. FINDINGS: Lower chest: The heart is enlarged and there is a small pericardial effusion. Age advanced coronary artery calcifications are noted. Small bilateral pleural effusions with overlying atelectasis are noted. Hepatobiliary: No focal hepatic lesions are identified without contrast. Moderate respiratory motion to the liver limits evaluation. The gallbladder is grossly normal. No obvious common bile duct dilatation. Pancreas: Limited examination due to motion artifact but no gross abnormality. Spleen: Normal size.  No focal lesions. Adrenals/Urinary Tract: The adrenal glands are grossly normal. No obvious renal calculi, hydronephrosis or mass. The bladder is grossly normal. Stomach/Bowel: Limited evaluation due to motion artifact. No gross bowel abnormality or obstructive findings. The terminal ileum and appendix appear normal. Vascular/Lymphatic: Moderate atherosclerotic calcifications involving the aorta and iliac arteries but no aneurysm. Scattered mesenteric and retroperitoneal lymph nodes without mass or overt adenopathy. Reproductive: Surgically absent. Other: Marked diffuse subcutaneous soft tissue swelling/edema/fluid and gas in the region of the mons pubis, left labia, perineum and suprapubic region. I do not see a discrete drainable fluid collection or fistula. No intrapelvic component is identified. Findings worrisome for Fournier gangrene.  Adjacent inflamed/enlarged inguinal lymph nodes are noted. Musculoskeletal: No significant bony findings. I do not see any evidence of septic arthritis or osteomyelitis. IMPRESSION: 1. Extensive inflammatory/infectious process involving the mons pubis, labia, perineum and suprapubic regions with extensive gas formation. Findings worrisome for Fournier's gangrene. No discrete drainable soft tissue abscess. 2. No intra-abdominal/intrapelvic abscess or obvious inflammatory process. 3. Small bilateral pleural effusions with overlying atelectasis. These results will be called to the ordering clinician or representative by the Radiologist Assistant, and communication documented in the PACS or zVision Dashboard. Electronically Signed   By: Marijo Sanes M.D.   On: 09/10/2019 12:05   US Renal  Result Date: 09/10/2019 CLINICAL DATA:  Acute kidney injury. EXAM: RENAL / URINARY TRACT ULTRASOUND COMPLETE COMPARISON:  November 30, 2009 FINDINGS: Right Kidney: Renal measurements: 13.1 x 5.2 x 5.3 cm = volume: 194 mL . Echogenicity within normal limits. No mass or hydronephrosis visualized. Left Kidney: Renal measurements: 13.5 x 5 x 5.3 cm = volume: 188 mL. Echogenicity within normal limits. No mass or hydronephrosis visualized. Bladder: Not visualized. IMPRESSION: 1. Body habitus limited study. 2. Given the above limitation, no acute abnormality was detected. No hydronephrosis. Electronically Signed   By: Constance Holster M.D.   On: 09/10/2019 10:09   US Abdomen Limited  Result Date: 09/09/2019 CLINICAL DATA:  Left labia swelling. EXAM: LIMITED ULTRASOUND OF PELVIS TECHNIQUE: Limited transabdominal ultrasound examination of the pelvis was performed. COMPARISON:  None. FINDINGS: Ultrasound performed in the area of concern in the left labia. No fluid collection or other soft tissue abnormality noted. IMPRESSION: No focal fluid collection or other abnormality noted. Electronically Signed   By: Rolm Baptise M.D.   On:  09/09/2019 10:27   Dg Chest Port 1 View  Result Date: 09/08/2019 CLINICAL DATA:  Fever EXAM: PORTABLE CHEST 1 VIEW COMPARISON:  July 16, 2018 FINDINGS: There is airspace consolidation in each lower lung zone. There is cardiomegaly  with pulmonary venous hypertension. No adenopathy evident. No bone lesions. There is postoperative change in the cervicothoracic junction region. IMPRESSION: Lower lung zone consolidation bilaterally, likely multifocal pneumonia. Underlying pulmonary vascular congestion. No adenopathy evident. Electronically Signed   By: Lowella Grip III M.D.   On: 09/08/2019 17:08     Antimicrobials:   Azithro/ceftriaxone >9/28  Clindamycin >9/30    Subjective: No acute events overnight was transferred from [facility] Earlier reported some labial pain none currently  Objective: Vitals:   09/10/19 1431 09/10/19 2113 09/10/19 2352 09/11/19 0458  BP: (!) 126/59 (!) 118/54 119/61 (!) 98/58  Pulse: 78 79 76 75  Resp: _0 Temp: 100.2 F (37.9 C) 99.5 F (37.5 C) 99.8 F (37.7 C) 99.3 F (37.4 C)  TempSrc: Oral Oral Oral Oral  SpO2: 90% 91% 95% 91%  Weight:      Height:        Intake/Output Summary (Last 24 hours) at 09/11/2019 1204 Last data filed at 09/11/2019 0131 Gross per 24 hour  Intake 350 ml  Output 150 ml  Net 200 ml   Filed Weights   09/08/19 1517  Weight: (!) 152 kg    Examination:  General exam: Appears labile, tearful Respiratory system: Clear to auscultation. Respiratory effort normal. Cardiovascular system: S1 & S2 heard, RRR. No JVD, murmurs, rubs, gallops or clicks. No pedal edema. Gastrointestinal system: Abdomen is nondistended, soft and nontender. No organomegaly or masses felt. Normal bowel sounds heard. Central nervous system: Alert and oriented. No focal neurological deficits. Extremities: Warm well perfused, trace edema Skin: Draining labial ulceration, Psychiatry: Judgement and insight appear impaired, questionable  cognitive deficit. Mood & affect while    Data Reviewed: I have personally reviewed following labs and imaging studies  CBC: Recent Labs  Lab 09/08/19 1607 09/10/19 0559 09/11/19 0813  WBC 21.0* 17.6* 18.1*  NEUTROABS 16.8*  --  12.9*  HGB 9.7* 9.4* 8.8*  HCT 31.0* 30.8* 27.4*  MCV 87.8 89.5 86.7  PLT 222 211 423   Basic Metabolic Panel: Recent Labs  Lab 09/08/19 1607 09/10/19 0559 09/11/19 0813  NA 133* 135 132*  K 3.6 4.0 4.4  CL 100 100 100  CO2 23 22 17*  GLUCOSE 268* 170* 104*  BUN 30* 53* 64*  CREATININE 1.88* 4.13* 5.91*  CALCIUM 7.3* 7.0* 6.7*   GFR: Estimated Creatinine Clearance: 15 mL/min (A) (by C-G formula based on SCr of 5.91 mg/dL (H)). Liver Function Tests: Recent Labs  Lab 09/08/19 1607 09/10/19 0559 09/11/19 0813  AST 33 52* 73*  ALT 32 32 42  ALKPHOS 183* 164* 166*  BILITOT 1.7* 1.0 1.0  PROT 7.2 6.7 6.2*  ALBUMIN 2.6* 2.1* 1.6*   No results for input(s): LIPASE, AMYLASE in the last 168 hours. No results for input(s): AMMONIA in the last 168 hours. Coagulation Profile: Recent Labs  Lab 09/08/19 1607  INR 1.2   Cardiac Enzymes: Recent Labs  Lab 09/10/19 1054  CKTOTAL 732*   BNP (last 3 results) No results for input(s): PROBNP in the last 8760 hours. HbA1C: Recent Labs    09/08/19 1725  HGBA1C 10.2*   CBG: Recent Labs  Lab 09/10/19 0733 09/10/19 1157 09/10/19 1641 09/10/19 2111 09/11/19 0801  GLUCAP 148* 144* 140* 141* 102*   Lipid Profile: No results for input(s): CHOL, HDL, LDLCALC, TRIG, CHOLHDL, LDLDIRECT in the last 72 hours. Thyroid Function Tests: No results for input(s): TSH, T4TOTAL, FREET4, T3FREE, THYROIDAB in the last 72 hours. Anemia  Panel: No results for input(s): VITAMINB12, FOLATE, FERRITIN, TIBC, IRON, RETICCTPCT in the last 72 hours. Sepsis Labs: Recent Labs  Lab 09/08/19 1607 09/08/19 1725  LATICACIDVEN 1.9 1.5    Recent Results (from the past 240 hour(s))  Urine culture     Status:  Abnormal   Collection Time: 09/08/19  3:58 PM   Specimen: In/Out Cath Urine  Result Value Ref Range Status   Specimen Description   Final    IN/OUT CATH URINE Performed at [facility], [address], [place], [place] [address]    Special Requests   Final    NONE Performed at [facility], [address], [place],  [address]    Culture >=100,000 COLONIES/mL YEAST (A)  Final   Report Status 09/10/2019 FINAL  Final  SARS Coronavirus 2 [facility] order, Performed in [facility] hospital lab) Nasopharyngeal Nasopharyngeal Swab     Status: None   Collection Time: 09/08/19  3:59 PM   Specimen: Nasopharyngeal Swab  Result Value Ref Range Status   SARS Coronavirus 2 NEGATIVE NEGATIVE Final    Comment: (NOTE) If result is NEGATIVE SARS-CoV-2 target nucleic acids are NOT DETECTED. The SARS-CoV-2 RNA is generally detectable in upper and lower  respiratory specimens during the acute phase of infection. The lowest  concentration of SARS-CoV-2 viral copies this assay can detect is 250  copies / mL. A negative result does not preclude SARS-CoV-2 infection  and should not be used as the sole basis for treatment or other  patient management decisions.  A negative result may occur with  improper specimen collection / handling, submission of specimen other  than nasopharyngeal swab, presence of viral mutation(s) within the  areas targeted by this assay, and inadequate number of viral copies  (<250 copies / mL). A negative result must be combined with clinical  observations, patient history, and epidemiological information. If result is POSITIVE SARS-CoV-2 target nucleic acids are DETECTED. The SARS-CoV-2 RNA is generally detectable in upper and lower  respiratory specimens dur ing the acute phase of infection.  Positive  results are indicative of active infection with SARS-CoV-2.  Clinical  correlation with patient history and other diagnostic information is  necessary to determine patient  infection status.  Positive results do  not rule out bacterial infection or co-infection with other viruses. If result is PRESUMPTIVE POSTIVE SARS-CoV-2 nucleic acids MAY BE PRESENT.   A presumptive positive result was obtained on the submitted specimen  and confirmed on repeat testing.  While 2019 novel coronavirus  (SARS-CoV-2) nucleic acids may be present in the submitted sample  additional confirmatory testing may be necessary for epidemiological  and / or clinical management purposes  to differentiate between  SARS-CoV-2 and other Sarbecovirus currently known to infect humans.  If clinically indicated additional testing with an alternate test  methodology [phone]) is advised. The SARS-CoV-2 RNA is generally  detectable in upper and lower respiratory sp ecimens during the acute  phase of infection. The expected result is Negative. Fact Sheet for Patients:  StrictlyIdeas.no Fact Sheet for Healthcare Providers: BankingDealers.co.za This test is not yet approved or cleared by the [place] FDA and has been authorized for detection and/or diagnosis of SARS-CoV-2 by FDA under an Emergency Use Authorization (EUA).  This EUA will remain in effect (meaning this test can be used) for the duration of the COVID-19 declaration under Section 564(b)(1) of the Act, 21 U.S.C. section 360bbb-3(b)(1), unless the authorization is terminated or revoked sooner. Performed at [facility], [phone]  [address], [place], [place] [address]   Blood Culture (routine x 2)     Status: None (Preliminary result)   Collection Time: 09/08/19  4:07 PM   Specimen: BLOOD RIGHT WRIST  Result Value Ref Range Status   Specimen Description BLOOD RIGHT WRIST  Final   Special Requests   Final    BOTTLES DRAWN AEROBIC AND ANAEROBIC Blood Culture adequate volume   Culture   Final    NO GROWTH 3 DAYS Performed at [facility], [address], [place], [place] [address]     Report Status PENDING  Incomplete  Blood Culture (routine x 2)     Status: None (Preliminary result)   Collection Time: 09/08/19  4:17 PM   Specimen: BLOOD LEFT HAND  Result Value Ref Range Status   Specimen Description BLOOD LEFT HAND  Final   Special Requests   Final    BOTTLES DRAWN AEROBIC AND ANAEROBIC Blood Culture adequate volume   Culture   Final    NO GROWTH 3 DAYS Performed at [facility], [address], [place], [place] [address]    Report Status PENDING  Incomplete         Radiology Studies: Ct Abdomen Pelvis Wo Contrast  Result Date: 09/10/2019 CLINICAL DATA:  Left-sided labial swelling for 3-4 days. Possible abscess. EXAM: CT ABDOMEN AND PELVIS WITHOUT CONTRAST TECHNIQUE: Multidetector CT imaging of the abdomen and pelvis was performed following the standard protocol without IV contrast. COMPARISON:  None. FINDINGS: Lower chest: The heart is enlarged and there is a small pericardial effusion. Age advanced coronary artery calcifications are noted. Small bilateral pleural effusions with overlying atelectasis are noted. Hepatobiliary: No focal hepatic lesions are identified without contrast. Moderate respiratory motion to the liver limits evaluation. The gallbladder is grossly normal. No obvious common bile duct dilatation. Pancreas: Limited examination due to motion artifact but no gross abnormality. Spleen: Normal size.  No focal lesions. Adrenals/Urinary Tract: The adrenal glands are grossly normal. No obvious renal calculi, hydronephrosis or mass. The bladder is grossly normal. Stomach/Bowel: Limited evaluation due to motion artifact. No gross bowel abnormality or obstructive findings. The terminal ileum and appendix appear normal. Vascular/Lymphatic: Moderate atherosclerotic calcifications involving the aorta and iliac arteries but no aneurysm. Scattered mesenteric and retroperitoneal lymph nodes without mass or overt adenopathy. Reproductive: Surgically absent. Other: Marked  diffuse subcutaneous soft tissue swelling/edema/fluid and gas in the region of the mons pubis, left labia, perineum and suprapubic region. I do not see a discrete drainable fluid collection or fistula. No intrapelvic component is identified. Findings worrisome for Fournier gangrene. Adjacent inflamed/enlarged inguinal lymph nodes are noted. Musculoskeletal: No significant bony findings. I do not see any evidence of septic arthritis or osteomyelitis. IMPRESSION: 1. Extensive inflammatory/infectious process involving the mons pubis, labia, perineum and suprapubic regions with extensive gas formation. Findings worrisome for Fournier's gangrene. No discrete drainable soft tissue abscess. 2. No intra-abdominal/intrapelvic abscess or obvious inflammatory process. 3. Small bilateral pleural effusions with overlying atelectasis. These results will be called to the ordering clinician or representative by the Radiologist Assistant, and communication documented in the PACS or zVision Dashboard. Electronically Signed   By: Marijo Sanes M.D.   On: 09/10/2019 12:05   US Renal  Result Date: 09/10/2019 CLINICAL DATA:  Acute kidney injury. EXAM: RENAL / URINARY TRACT ULTRASOUND COMPLETE COMPARISON:  November 30, 2009 FINDINGS: Right Kidney: Renal measurements: 13.1 x 5.2 x 5.3 cm = volume: 194 mL . Echogenicity within normal limits. No mass or hydronephrosis visualized. Left Kidney:  Renal measurements: 13.5 x 5 x 5.3 cm = volume: 188 mL. Echogenicity within normal limits. No mass or hydronephrosis visualized. Bladder: Not visualized. IMPRESSION: 1. Body habitus limited study. 2. Given the above limitation, no acute abnormality was detected. No hydronephrosis. Electronically Signed   By: Constance Holster M.D.   On: 09/10/2019 10:09        Scheduled Meds:  aspirin EC  81 mg Oral Daily   atorvastatin  80 mg Oral QPM   Chlorhexidine Gluconate Cloth  6 each Topical Daily   clopidogrel  75 mg Oral q morning - 10a    feeding supplement (ENSURE ENLIVE)  237 mL Oral BID BM   heparin injection (subcutaneous)  5,000 Units Subcutaneous Q8H   insulin aspart  0-15 Units Subcutaneous TID WC   insulin aspart  0-5 Units Subcutaneous QHS   insulin glargine  25 Units Subcutaneous QHS   levothyroxine  125 mcg Oral Q0600   multivitamin with minerals  1 tablet Oral Daily   nystatin   Topical TID   sodium bicarbonate  650 mg Oral TID   Continuous Infusions:  azithromycin 500 mg (09/11/19 0131)   cefTRIAXone (ROCEPHIN)  IV 2 g (09/10/19 2223)   clindamycin (CLEOCIN) IV 600 mg (09/11/19 0525)   sodium chloride       LOS: 3 days    Time spent: 35 min    Nicolette Bang, MD [facility]  If 7PM-7AM, please contact night-coverage  09/11/2019, 12:04 PM

## 2019-09-11 NOTE — Progress Notes (Signed)
Dry dressing applied as ordered. Pt tolerated well. Will continue to monitor.

## 2019-09-11 NOTE — Progress Notes (Addendum)
Patient ID: Maureen Jackson, female   DOB: October 21, 1956, 63 y.o.   MRN: [id number] [facility] Progress Note   Assessment/ Plan:   1. Acute kidney Injury: Likely ATN from sepsis/vancomycin nephrotoxicity with differentials including AIN.  Low index of suspicion that this is an acute GN based on initial urinalysis/urine microscopy findings. Oliguric with worsening renal function noted overnight.  She appears to be euvolemic on physical exam (and possibly hypervolemic-difficult to verify with her habitus) and further fluids will be avoided at this time especially with regards to her history of diastolic heart failure.  She had some serologies including an antistreptolysin O titer that was negative and complement levels that are normal or elevated.  Renal ultrasound negative for obstruction.  She does not have any indications for dialysis at this time and will continue conservative/supportive management with avoidance of nephrotoxins, support of blood pressure and monitoring input/output. 2.  Hyponatremia: Secondary to acute kidney injury and impaired free water excretion.  Will restrict oral fluid intake at this time. 3.  Anion gap metabolic acidosis: Secondary to acute kidney injury/SIRS from infection, begin oral sodium bicarbonate. 4.  Left labial abscess versus Fournier's gangrene: Radiological evaluation showed extensive infection with subcutaneous gas formation worrisome for Fournier's gangrene however physical evaluation by surgery earlier today shows left labial abscess that is open and draining with plans for continued intravenous antibiotics and wound care unless becomes febrile again or has worsening leukocytosis. 5.  Anemia: Secondary to acute/critical illness, no overt loss.  Continue to monitor trend.  Subjective:   Reports to be feeling fair, earlier was having a lot of pain in her perineal area.   Objective:   BP (!) 98/58 (BP Location: Left Wrist)   Pulse 75   Temp 99.3 F  (37.4 C) (Oral)   Resp 17   Ht 5\' 5"  (1.651 m)   Wt (!) 152 kg   SpO2 91%   BMI 55.75 kg/m   Intake/Output Summary (Last 24 hours) at 09/11/2019 1114 Last data filed at 09/11/2019 0131 Gross per 24 hour  Intake 350 ml  Output 150 ml  Net 200 ml   Weight change:   Physical Exam: Gen: Comfortably resting in bed, morbidly obese woman CVS: Pulse regular rhythm, normal rate, S1 and S2 normal Resp: Clear to auscultation, no rales/rhonchi Abd: Soft, obese, nontender, bowel sounds normal Ext: No lower extremity edema  Imaging: Ct Abdomen Pelvis Wo Contrast  Result Date: 09/10/2019 CLINICAL DATA:  Left-sided labial swelling for 3-4 days. Possible abscess. EXAM: CT ABDOMEN AND PELVIS WITHOUT CONTRAST TECHNIQUE: Multidetector CT imaging of the abdomen and pelvis was performed following the standard protocol without IV contrast. COMPARISON:  None. FINDINGS: Lower chest: The heart is enlarged and there is a small pericardial effusion. Age advanced coronary artery calcifications are noted. Small bilateral pleural effusions with overlying atelectasis are noted. Hepatobiliary: No focal hepatic lesions are identified without contrast. Moderate respiratory motion to the liver limits evaluation. The gallbladder is grossly normal. No obvious common bile duct dilatation. Pancreas: Limited examination due to motion artifact but no gross abnormality. Spleen: Normal size.  No focal lesions. Adrenals/Urinary Tract: The adrenal glands are grossly normal. No obvious renal calculi, hydronephrosis or mass. The bladder is grossly normal. Stomach/Bowel: Limited evaluation due to motion artifact. No gross bowel abnormality or obstructive findings. The terminal ileum and appendix appear normal. Vascular/Lymphatic: Moderate atherosclerotic calcifications involving the aorta and iliac arteries but no aneurysm. Scattered mesenteric and retroperitoneal lymph nodes without mass or overt  adenopathy. Reproductive: Surgically  absent. Other: Marked diffuse subcutaneous soft tissue swelling/edema/fluid and gas in the region of the mons pubis, left labia, perineum and suprapubic region. I do not see a discrete drainable fluid collection or fistula. No intrapelvic component is identified. Findings worrisome for Fournier gangrene. Adjacent inflamed/enlarged inguinal lymph nodes are noted. Musculoskeletal: No significant bony findings. I do not see any evidence of septic arthritis or osteomyelitis. IMPRESSION: 1. Extensive inflammatory/infectious process involving the mons pubis, labia, perineum and suprapubic regions with extensive gas formation. Findings worrisome for Fournier's gangrene. No discrete drainable soft tissue abscess. 2. No intra-abdominal/intrapelvic abscess or obvious inflammatory process. 3. Small bilateral pleural effusions with overlying atelectasis. These results will be called to the ordering clinician or representative by the Radiologist Assistant, and communication documented in the PACS or zVision Dashboard. Electronically Signed   By: Marijo Sanes M.D.   On: 09/10/2019 12:05   US Renal  Result Date: 09/10/2019 CLINICAL DATA:  Acute kidney injury. EXAM: RENAL / URINARY TRACT ULTRASOUND COMPLETE COMPARISON:  November 30, 2009 FINDINGS: Right Kidney: Renal measurements: 13.1 x 5.2 x 5.3 cm = volume: 194 mL . Echogenicity within normal limits. No mass or hydronephrosis visualized. Left Kidney: Renal measurements: 13.5 x 5 x 5.3 cm = volume: 188 mL. Echogenicity within normal limits. No mass or hydronephrosis visualized. Bladder: Not visualized. IMPRESSION: 1. Body habitus limited study. 2. Given the above limitation, no acute abnormality was detected. No hydronephrosis. Electronically Signed   By: Constance Holster M.D.   On: 09/10/2019 10:09   Labs: BMET Recent Labs  Lab 09/08/19 1607 09/10/19 0559 09/11/19 0813  NA 133* 135 132*  K 3.6 4.0 4.4  CL 100 100 100  CO2 23 22 17*  GLUCOSE 268* 170* 104*   BUN 30* 53* 64*  CREATININE 1.88* 4.13* 5.91*  CALCIUM 7.3* 7.0* 6.7*   CBC Recent Labs  Lab 09/08/19 1607 09/10/19 0559 09/11/19 0813  WBC 21.0* 17.6* 18.1*  NEUTROABS 16.8*  --  12.9*  HGB 9.7* 9.4* 8.8*  HCT 31.0* 30.8* 27.4*  MCV 87.8 89.5 86.7  PLT 222 211 244    Medications:    . aspirin EC  81 mg Oral Daily  . atorvastatin  80 mg Oral QPM  . Chlorhexidine Gluconate Cloth  6 each Topical Daily  . clopidogrel  75 mg Oral q morning - 10a  . feeding supplement (ENSURE ENLIVE)  237 mL Oral BID BM  . heparin injection (subcutaneous)  5,000 Units Subcutaneous Q8H  . insulin aspart  0-15 Units Subcutaneous TID WC  . insulin aspart  0-5 Units Subcutaneous QHS  . insulin glargine  25 Units Subcutaneous QHS  . levothyroxine  125 mcg Oral Q0600  . multivitamin with minerals  1 tablet Oral Daily  . nystatin   Topical TID   Elmarie Shiley, MD 09/11/2019, 11:14 AM

## 2019-09-12 ENCOUNTER — Inpatient Hospital Stay (HOSPITAL_COMMUNITY): Payer: Medicare HMO

## 2019-09-12 DIAGNOSIS — Z886 Allergy status to analgesic agent status: Secondary | ICD-10-CM

## 2019-09-12 DIAGNOSIS — N179 Acute kidney failure, unspecified: Secondary | ICD-10-CM

## 2019-09-12 DIAGNOSIS — G4733 Obstructive sleep apnea (adult) (pediatric): Secondary | ICD-10-CM

## 2019-09-12 DIAGNOSIS — N764 Abscess of vulva: Secondary | ICD-10-CM | POA: Diagnosis not present

## 2019-09-12 DIAGNOSIS — E118 Type 2 diabetes mellitus with unspecified complications: Secondary | ICD-10-CM

## 2019-09-12 DIAGNOSIS — T368X5A Adverse effect of other systemic antibiotics, initial encounter: Secondary | ICD-10-CM

## 2019-09-12 DIAGNOSIS — E039 Hypothyroidism, unspecified: Secondary | ICD-10-CM

## 2019-09-12 DIAGNOSIS — Z87891 Personal history of nicotine dependence: Secondary | ICD-10-CM

## 2019-09-12 LAB — BLOOD GAS, ARTERIAL
Acid-base deficit: 6.2 mmol/L — ABNORMAL HIGH (ref 0.0–2.0)
Bicarbonate: 18.1 mmol/L — ABNORMAL LOW (ref 20.0–28.0)
Drawn by: 5260
O2 Saturation: 95.1 %
Patient temperature: 98.6
pCO2 arterial: 32 mmHg (ref 32.0–48.0)
pH, Arterial: 7.37 (ref 7.350–7.450)
pO2, Arterial: 80.9 mmHg — ABNORMAL LOW (ref 83.0–108.0)

## 2019-09-12 LAB — RENAL FUNCTION PANEL
Albumin: 1.4 g/dL — ABNORMAL LOW (ref 3.5–5.0)
Anion gap: 15 (ref 5–15)
BUN: 77 mg/dL — ABNORMAL HIGH (ref 8–23)
CO2: 18 mmol/L — ABNORMAL LOW (ref 22–32)
Calcium: 6.6 mg/dL — ABNORMAL LOW (ref 8.9–10.3)
Chloride: 99 mmol/L (ref 98–111)
Creatinine, Ser: 6.9 mg/dL — ABNORMAL HIGH (ref 0.44–1.00)
GFR calc Af Amer: 7 mL/min — ABNORMAL LOW (ref >60)
GFR calc non Af Amer: 6 mL/min — ABNORMAL LOW (ref >60)
Glucose, Bld: 75 mg/dL (ref 70–99)
Phosphorus: 8.1 mg/dL — ABNORMAL HIGH (ref 2.5–4.6)
Potassium: 4.3 mmol/L (ref 3.5–5.1)
Sodium: 132 mmol/L — ABNORMAL LOW (ref 135–145)

## 2019-09-12 LAB — FANA STAINING PATTERNS
Nuclear Dot Pattern: 1:1280 {titer} — ABNORMAL HIGH
Speckled Pattern: 1:320 {titer} — ABNORMAL HIGH

## 2019-09-12 LAB — MPO/PR-3 (ANCA) ANTIBODIES
ANCA Proteinase 3: <3.5 U/mL (ref 0.0–3.5)
Myeloperoxidase Abs: <9 U/mL (ref 0.0–9.0)

## 2019-09-12 LAB — CBC
HCT: 26.3 % — ABNORMAL LOW (ref 36.0–46.0)
Hemoglobin: 8.6 g/dL — ABNORMAL LOW (ref 12.0–15.0)
MCH: 28 pg (ref 26.0–34.0)
MCHC: 32.7 g/dL (ref 30.0–36.0)
MCV: 85.7 fL (ref 80.0–100.0)
Platelets: 245 10*3/uL (ref 150–400)
RBC: 3.07 MIL/uL — ABNORMAL LOW (ref 3.87–5.11)
RDW: 14.3 % (ref 11.5–15.5)
WBC: 20.9 10*3/uL — ABNORMAL HIGH (ref 4.0–10.5)
nRBC: 0 % (ref 0.0–0.2)

## 2019-09-12 LAB — GLUCOSE, CAPILLARY
Glucose-Capillary: 71 mg/dL (ref 70–99)
Glucose-Capillary: 75 mg/dL (ref 70–99)
Glucose-Capillary: 86 mg/dL (ref 70–99)
Glucose-Capillary: 94 mg/dL (ref 70–99)

## 2019-09-12 LAB — VANCOMYCIN, RANDOM: Vancomycin Rm: 35

## 2019-09-12 LAB — GLOMERULAR BASEMENT MEMBRANE ANTIBODIES: GBM Ab: 10 units (ref 0–20)

## 2019-09-12 LAB — ANTINUCLEAR ANTIBODIES, IFA: ANA Ab, IFA: POSITIVE — AB

## 2019-09-12 MED ORDER — LINEZOLID 600 MG/300ML IV SOLN
600.0000 mg | Freq: Two times a day (BID) | INTRAVENOUS | Status: DC
  Administered 2019-09-12 – 2019-09-15 (×8): 600 mg via INTRAVENOUS
  Filled 2019-09-12 (×11): qty 300

## 2019-09-12 MED ORDER — SODIUM CHLORIDE 0.9 % IV SOLN
3.0000 g | Freq: Two times a day (BID) | INTRAVENOUS | Status: DC
  Administered 2019-09-12 – 2019-09-17 (×10): 3 g via INTRAVENOUS
  Filled 2019-09-12: qty 8
  Filled 2019-09-12 (×6): qty 3
  Filled 2019-09-12: qty 8
  Filled 2019-09-12: qty 3
  Filled 2019-09-12 (×2): qty 8
  Filled 2019-09-12 (×3): qty 3

## 2019-09-12 MED ORDER — DEXTROSE-NACL 5-0.45 % IV SOLN
INTRAVENOUS | Status: DC
  Administered 2019-09-12 – 2019-09-13 (×2): via INTRAVENOUS

## 2019-09-12 MED ORDER — ENSURE MAX PROTEIN PO LIQD
11.0000 [oz_av] | Freq: Two times a day (BID) | ORAL | Status: DC
  Administered 2019-09-12 – 2019-09-17 (×7): 11 [oz_av] via ORAL
  Filled 2019-09-12 (×12): qty 330

## 2019-09-12 NOTE — Progress Notes (Signed)
Nutrition Follow-up  DOCUMENTATION CODES:   Morbid obesity  INTERVENTION:   -MVI with minerals daily -Ensure Max po BID, each supplement provides 150 kcal and 30 grams of protein.   NUTRITION DIAGNOSIS:   Inadequate oral intake related to acute illness(pneumonia; abscess in left labia/upper thigh area) as evidenced by meal completion < 25%.  Progressing   GOAL:   Patient will meet greater than or equal to 90% of their needs  Progressing   MONITOR:   PO intake, Labs, I & O's, Weight trends, Supplement acceptance, Skin  REASON FOR ASSESSMENT:   Consult Assessment of nutrition requirement/status  ASSESSMENT:   63 year old female with medical history significant for morbid obesity, CHF, hypothyroidism, HTN, OSA, CAD, T2DM, and arthritis who presented to ED with complaints of generalized weakness, severe dry cough, difficulty voiding, poor appetite, and difficulty sleeping for the past 3 days. Patient seen in ED 9/26 for abscess in left labia/upper thigh area that has been draining large amounts of pus; patient discharged on doxycyline and pain medication. Portable CXR showed multifocal pneumonia underlying pulmonary vascular congestion  10/1- [facility] to [facility] for surgery evaluation  Reviewed I/O's: +700 ml x 24 hours and +2.9 L since admission  UOP: 100 ml x 24 hours  Case discussed with RN, who reports pt is tolerating PO's well. Pt consumed some sips of Ensure during med pass this AM. Plan NPO after midnight for I&D tomorrow.   Pt sleeping soundly at time of visit and did not respond to voice. Pt with increased nutritional needs; would benefit from addition of oral nutrition supplements.   Lab Results  Component Value Date   HGBA1C 10.2 (H) 09/08/2019   PTA DM medications are 15 units insulin aspart TID with meals, 1000 mg metformin BID, 1 mg jardiance daily, and 16 units insulin degludec daily.   Labs reviewed: Na: 132, Phos: 8.1, CBGS: 83-91 (inpatient  orders for glycemic control are 0-15 units insulin aspart TID with meals, 0-5 units insulin aspart q HS, and 25 units isnulin glargine q HS).   NUTRITION - FOCUSED PHYSICAL EXAM:    Most Recent Value  Orbital Region  No depletion  Upper Arm Region  No depletion  Thoracic and Lumbar Region  No depletion  Buccal Region  No depletion  Temple Region  No depletion  Clavicle Bone Region  No depletion  Clavicle and Acromion Bone Region  No depletion  Scapular Bone Region  No depletion  Dorsal Hand  No depletion  Patellar Region  No depletion  Anterior Thigh Region  No depletion  Posterior Calf Region  No depletion  Edema (RD Assessment)  Moderate  Hair  Reviewed  Eyes  Reviewed  Mouth  Reviewed  Skin  Reviewed  Nails  Reviewed       Diet Order:   Diet Order            Diet NPO time specified  Diet effective midnight        Diet Carb Modified Fluid consistency: Thin; Room service appropriate? Yes  Diet effective now              EDUCATION NEEDS:   Not appropriate for education at this time  Skin:  Skin Assessment: Skin Integrity Issues: Skin Integrity Issues:: Other (Comment) Other: lt labial abscess concerning for Fournier's gangrene  Last BM:  09/03/19  Height:   Ht Readings from Last 1 Encounters:  09/08/19 5\' 5"  (1.651 m)    Weight:   Wt  Readings from Last 1 Encounters:  09/08/19 (!) 152 kg    Ideal Body Weight:  56.8 kg  BMI:  Body mass index is 55.75 kg/m.  Estimated Nutritional Needs:   Kcal:  1800-2000  Protein:  120-140 grams  Fluid:  > 1.8 L    Gavriel Holzhauer A. Jimmye Norman, RD, LDN, [place] Registered Dietitian II Certified Diabetes Care and Education Specialist Pager: [phone] After hours Pager: [phone]

## 2019-09-12 NOTE — Progress Notes (Signed)
Patient ID: Maureen Jackson, female   DOB: [birth year], 63 y.o.   MRN: [id number] [facility] Progress Note   Assessment/ Plan:   1. Acute kidney Injury: Likely ATN from sepsis/vancomycin nephrotoxicity with differentials including AIN.  Low index of suspicion that this is an acute GN based on initial urinalysis/urine microscopy findings.  She is anuric with worsening renal function/azotemia noted but without acute indications for dialysis at this time.  We will continue active monitoring. 2.  Hyponatremia: Secondary to acute kidney injury and impaired free water excretion.  Monitor daily labs. 3.  Anion gap metabolic acidosis: Secondary to acute kidney injury/SIRS from infection, continue sodium bicarbonate. 4.  Left labial abscess versus Fournier's gangrene: Earlier CT scan suggestive of Fournier's gangrene prompting transfer to [facility] for possible surgical intervention.  On examination found to be having local labial abscess with drainage-ongoing intravenous antibiotics with some rise of leukocytosis noted and possible plans for surgical debridement/exploration tomorrow if continues to worsen. 5.  Anemia: Secondary to acute/critical illness, no overt loss.  Continue to monitor trend.  Subjective:   Reports to be feeling fair, earlier was having a lot of pain in her perineal area.   Objective:   BP (!) 105/59 (BP Location: Left Arm)   Pulse 64   Temp 98.6 F (37 C) (Oral)   Resp 16   Ht 5\' 5"  (1.651 m)   Wt (!) 152 kg   SpO2 93%   BMI 55.75 kg/m   Intake/Output Summary (Last 24 hours) at 09/12/2019 0943 Last data filed at 09/12/2019 9379 Gross per 24 hour  Intake 800 ml  Output 100 ml  Net 700 ml   Weight change:   Physical Exam: Gen: Comfortably resting in bed, morbidly obese woman CVS: Pulse regular rhythm, normal rate, S1 and S2 normal Resp: Clear to auscultation, no rales/rhonchi Abd: Soft, obese, nontender, bowel sounds normal Ext: No lower extremity  edema  Imaging: Ct Abdomen Pelvis Wo Contrast  Result Date: 09/10/2019 CLINICAL DATA:  Left-sided labial swelling for 3-4 days. Possible abscess. EXAM: CT ABDOMEN AND PELVIS WITHOUT CONTRAST TECHNIQUE: Multidetector CT imaging of the abdomen and pelvis was performed following the standard protocol without IV contrast. COMPARISON:  None. FINDINGS: Lower chest: The heart is enlarged and there is a small pericardial effusion. Age advanced coronary artery calcifications are noted. Small bilateral pleural effusions with overlying atelectasis are noted. Hepatobiliary: No focal hepatic lesions are identified without contrast. Moderate respiratory motion to the liver limits evaluation. The gallbladder is grossly normal. No obvious common bile duct dilatation. Pancreas: Limited examination due to motion artifact but no gross abnormality. Spleen: Normal size.  No focal lesions. Adrenals/Urinary Tract: The adrenal glands are grossly normal. No obvious renal calculi, hydronephrosis or mass. The bladder is grossly normal. Stomach/Bowel: Limited evaluation due to motion artifact. No gross bowel abnormality or obstructive findings. The terminal ileum and appendix appear normal. Vascular/Lymphatic: Moderate atherosclerotic calcifications involving the aorta and iliac arteries but no aneurysm. Scattered mesenteric and retroperitoneal lymph nodes without mass or overt adenopathy. Reproductive: Surgically absent. Other: Marked diffuse subcutaneous soft tissue swelling/edema/fluid and gas in the region of the mons pubis, left labia, perineum and suprapubic region. I do not see a discrete drainable fluid collection or fistula. No intrapelvic component is identified. Findings worrisome for Fournier gangrene. Adjacent inflamed/enlarged inguinal lymph nodes are noted. Musculoskeletal: No significant bony findings. I do not see any evidence of septic arthritis or osteomyelitis. IMPRESSION: 1. Extensive inflammatory/infectious process  involving the mons  pubis, labia, perineum and suprapubic regions with extensive gas formation. Findings worrisome for Fournier's gangrene. No discrete drainable soft tissue abscess. 2. No intra-abdominal/intrapelvic abscess or obvious inflammatory process. 3. Small bilateral pleural effusions with overlying atelectasis. These results will be called to the ordering clinician or representative by the Radiologist Assistant, and communication documented in the PACS or zVision Dashboard. Electronically Signed   By: Marijo Sanes M.D.   On: 09/10/2019 12:05   US Renal  Result Date: 09/10/2019 CLINICAL DATA:  Acute kidney injury. EXAM: RENAL / URINARY TRACT ULTRASOUND COMPLETE COMPARISON:  November 30, 2009 FINDINGS: Right Kidney: Renal measurements: 13.1 x 5.2 x 5.3 cm = volume: 194 mL . Echogenicity within normal limits. No mass or hydronephrosis visualized. Left Kidney: Renal measurements: 13.5 x 5 x 5.3 cm = volume: 188 mL. Echogenicity within normal limits. No mass or hydronephrosis visualized. Bladder: Not visualized. IMPRESSION: 1. Body habitus limited study. 2. Given the above limitation, no acute abnormality was detected. No hydronephrosis. Electronically Signed   By: Constance Holster M.D.   On: 09/10/2019 10:09   Dg Chest Port 1 View  Result Date: 09/12/2019 CLINICAL DATA:  Reason for exam: PNA Patient non verbal throughout exam. Hx of LBBB, htn, diabetes, cad, chrf. Hx of coronary stent 2018. Ex smoker quit 2007. EXAM: PORTABLE CHEST 1 VIEW COMPARISON:  Chest radiograph 09/08/2019 FINDINGS: Stable cardiomegaly. Central venous congestion. There are persistent airspace opacities in the bilateral lower lungs concerning for infection. No pneumothorax or large pleural effusion. No acute finding in the visualized skeleton. IMPRESSION: Persistent bilateral lower lung airspace opacities suspicious for infection. Electronically Signed   By: Audie Pinto M.D.   On: 09/12/2019 08:43   Labs: BMET Recent  Labs  Lab 09/08/19 1607 09/10/19 0559 09/11/19 0813 09/12/19 0531  NA 133* 135 132* 132*  K 3.6 4.0 4.4 4.3  CL 100 100 100 99  CO2 23 22 17* 18*  GLUCOSE 268* 170* 104* 75  BUN 30* 53* 64* 77*  CREATININE 1.88* 4.13* 5.91* 6.90*  CALCIUM 7.3* 7.0* 6.7* 6.6*  PHOS  --   --   --  8.1*   CBC Recent Labs  Lab 09/08/19 1607 09/10/19 0559 09/11/19 0813 09/12/19 0531  WBC 21.0* 17.6* 18.1* 20.9*  NEUTROABS 16.8*  --  12.9*  --   HGB 9.7* 9.4* 8.8* 8.6*  HCT 31.0* 30.8* 27.4* 26.3*  MCV 87.8 89.5 86.7 85.7  PLT 222 211 244 245    Medications:    . aspirin EC  81 mg Oral Daily  . atorvastatin  80 mg Oral QPM  . Chlorhexidine Gluconate Cloth  6 each Topical Daily  . clopidogrel  75 mg Oral q morning - 10a  . feeding supplement (ENSURE ENLIVE)  237 mL Oral BID BM  . heparin injection (subcutaneous)  5,000 Units Subcutaneous Q8H  . insulin aspart  0-15 Units Subcutaneous TID WC  . insulin aspart  0-5 Units Subcutaneous QHS  . insulin glargine  25 Units Subcutaneous QHS  . levothyroxine  125 mcg Oral Q0600  . multivitamin with minerals  1 tablet Oral Daily  . nystatin   Topical TID  . sodium bicarbonate  650 mg Oral TID   Elmarie Shiley, MD 09/12/2019, 9:43 AM

## 2019-09-12 NOTE — Progress Notes (Signed)
Patient refused CPAP for the night. RT will monitor as needed

## 2019-09-12 NOTE — Progress Notes (Signed)
PROGRESS NOTE    Maureen Jackson  ZOX:[id number] DOB: 1956/01/08 DOA: 09/08/2019 PCP: Neale Burly, MD   Brief Narrative:  63 y.o.femalewith medical history significant formorbid obesity(BMI > 55),ischemic and myopathy with combined systolic and diastolicdiastolic CHF, hypothyroidism, hypertension, OSA, coronary artery disease, prior TIA admitted on 09/08/2019 with sepsis secondary to pneumonia and left groin abscess,patient has developed significant AKI with oliguria,also there are concerns about possibleFournier's gangreneof the perineum -Transfer from [facility] to [facility] -Dr Brantley Stage (general surgery to see)    Assessment & Plan:   Principal Problem:   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  -Seen by general surgery in consultation no acute intervention felt to be necessary, continue IV antibiotics wound is draining they will follow closely also -Treat empirically with IV Rocephin and IV clindamycin for now -discussed with ID who will see in consultation  2)Sepsis secondary toPneumonia andPerineal Abscess --patient met sepsis criteria on admission --Fever curve is trending down, -WBC is 20.9<18.1<17.6  --Continue IV azithromycin, Rocephin andclindamycin, for now.cussed with ID as above -IV vancomycin discontinued on 09/10/2019 due to worsening renal function -Random vancomycin level was 43 -lethargic this AM, cxr noted, checking ABG-Pending, note uremia also  3)AKI----acute kidney injury due  to sepsis/atn and possible vancomycin toxicity -Random vancomycin level was 43, repeat pending -Seen by nephrology in consultation appreciate their input continue to follow  4)HFrEF/ischemic cardiomyopathy-history of combined diastolic and systolic dysfunction CHF-----c/nCoreg,  - holdLasix and Aldactone on hold due to soft BPand worsening renal function -stable and asymptomatic at this time -Hold Entresto given worsening renal function -echo1/2020LVEF 30-35%, grade I diastoilc dysfunction -repeat cxr, ?infection - 5)CAD -asymptomatic at this time, chest pain-free -LHC-09/2017 found to have severe multivessel disease -Received DES to LAD and DES x 2 to RCA, LCX disease managed medically. Recs for lifelong plavix per interventional cards. Has been on high dose ASA per neurology(h/o TIA) -Seen by CT surgery, recs were for PCI as opposed to CABG. continue aspirin and Plavix, continue Lipitor,  6)DM2- A1C was 10.2 , reflecting uncontrolled diabetes- -Lantus insulin 25 units nightly,Use Novolog/Humalog Sliding scale insulin with Accu-Cheks/Fingersticks as ordered -PTApatient was on Jardiance, SGLT2-inhibitor have been associated withFournier's gangrene  7)MorbidObesity and OSA---BMI > 55, This complicates overall care-continue CPAP nightly  DVT prophylaxis: Heparin SQ  Code Status: full Code Status History    Date Active Date Inactive Code Status Order ID Comments User Context   07/16/2018 1928 07/18/2018 2019 Full Code [id number]  Ivor Costa, MD ED   03/14/2018 2016 03/15/2018 2202 Full Code [id number]  Karmen Bongo, MD Inpatient   09/05/2017 2316 09/14/2017 1354 Full Code [id number]  Karmen Bongo, MD Inpatient   03/04/2013 2340 03/05/2013 1615 Full Code [id number]  Karlyn Agee, MD Inpatient   Advance Care Planning Activity     Family Communication: none today  Disposition Plan:   Patient remained inpatient for continued IV antibiotics, monitor electrolytes, continue  surgical evaluation with possible surgery tomorrow.  Without these treatments patient at risk Consults called: None Admission status: Inpatient   Consultants:  Nephrology, general surgery, infectious disease  Procedures:  Ct Abdomen Pelvis Wo Contrast  Result Date: 09/10/2019 CLINICAL DATA:  Left-sided labial swelling for 3-4 days. Possible abscess. EXAM: CT ABDOMEN AND PELVIS WITHOUT CONTRAST TECHNIQUE: Multidetector CT imaging of the abdomen and pelvis was performed following the standard protocol without IV contrast. COMPARISON:  None. FINDINGS: Lower chest: The heart is enlarged and there is a small pericardial effusion. Age advanced coronary artery calcifications are noted. Small bilateral pleural effusions with overlying atelectasis are noted. Hepatobiliary: No focal hepatic lesions are identified without contrast. Moderate respiratory motion to the liver limits evaluation. The gallbladder is grossly normal. No obvious common bile duct dilatation. Pancreas: Limited examination due to motion artifact but no gross abnormality. Spleen: Normal size.  No focal lesions. Adrenals/Urinary Tract: The adrenal glands are grossly normal. No obvious renal calculi, hydronephrosis or mass. The bladder is grossly normal. Stomach/Bowel: Limited evaluation due to motion artifact. No gross bowel abnormality or obstructive findings. The terminal ileum and appendix appear normal. Vascular/Lymphatic: Moderate atherosclerotic calcifications involving the aorta and iliac arteries but no aneurysm. Scattered mesenteric and retroperitoneal lymph nodes without mass or overt adenopathy. Reproductive: Surgically absent. Other: Marked diffuse subcutaneous soft tissue swelling/edema/fluid and gas in the region of the mons pubis, left labia, perineum and suprapubic region. I do not see a discrete drainable fluid collection or fistula. No intrapelvic component is identified. Findings worrisome for Fournier gangrene. Adjacent  inflamed/enlarged inguinal lymph nodes are noted. Musculoskeletal: No significant bony findings. I do not see any evidence of septic arthritis or osteomyelitis. IMPRESSION: 1. Extensive inflammatory/infectious process involving the mons pubis, labia, perineum and suprapubic regions with extensive gas formation. Findings worrisome for Fournier's gangrene. No discrete drainable soft tissue abscess. 2. No intra-abdominal/intrapelvic abscess or obvious inflammatory process. 3. Small bilateral pleural effusions with overlying atelectasis. These results will be called to the ordering clinician or representative by the Radiologist Assistant, and communication documented in the PACS or zVision Dashboard. Electronically Signed   By: Marijo Sanes M.D.   On: 09/10/2019 12:05   US Renal  Result Date: 09/10/2019 CLINICAL DATA:  Acute kidney injury. EXAM: RENAL / URINARY TRACT ULTRASOUND COMPLETE COMPARISON:  November 30, 2009 FINDINGS: Right Kidney: Renal measurements: 13.1 x 5.2 x 5.3 cm = volume: 194 mL . Echogenicity within normal limits. No mass or hydronephrosis visualized. Left Kidney: Renal measurements: 13.5 x 5 x 5.3 cm = volume: 188 mL. Echogenicity within normal limits. No mass or hydronephrosis visualized. Bladder: Not visualized. IMPRESSION: 1. Body habitus limited study. 2. Given the above limitation, no acute abnormality was detected. No hydronephrosis. Electronically Signed   By: Constance Holster M.D.   On: 09/10/2019 10:09   US Abdomen Limited  Result Date: 09/09/2019 CLINICAL DATA:  Left labia swelling. EXAM: LIMITED ULTRASOUND OF PELVIS TECHNIQUE: Limited transabdominal ultrasound examination of the pelvis was performed. COMPARISON:  None. FINDINGS: Ultrasound performed in the area of concern in the left labia. No fluid collection or other soft tissue abnormality noted. IMPRESSION: No focal fluid collection or other abnormality noted. Electronically Signed   By: Rolm Baptise M.D.   On: 09/09/2019  10:27   Dg Chest Port 1 View  Result Date: 09/12/2019 CLINICAL DATA:  Reason for exam: PNA Patient non verbal throughout exam. Hx of LBBB, htn, diabetes, cad, chrf. Hx of coronary stent 2018. Ex smoker quit 2007. EXAM: PORTABLE CHEST 1 VIEW COMPARISON:  Chest radiograph 09/08/2019 FINDINGS: Stable cardiomegaly. Central venous congestion. There are persistent airspace opacities in the bilateral  lower lungs concerning for infection. No pneumothorax or large pleural effusion. No acute finding in the visualized skeleton. IMPRESSION: Persistent bilateral lower lung airspace opacities suspicious for infection. Electronically Signed   By: Audie Pinto M.D.   On: 09/12/2019 08:43   Dg Chest Port 1 View  Result Date: 09/08/2019 CLINICAL DATA:  Fever EXAM: PORTABLE CHEST 1 VIEW COMPARISON:  July 16, 2018 FINDINGS: There is airspace consolidation in each lower lung zone. There is cardiomegaly with pulmonary venous hypertension. No adenopathy evident. No bone lesions. There is postoperative change in the cervicothoracic junction region. IMPRESSION: Lower lung zone consolidation bilaterally, likely multifocal pneumonia. Underlying pulmonary vascular congestion. No adenopathy evident. Electronically Signed   By: Lowella Grip III M.D.   On: 09/08/2019 17:08     Antimicrobials:   Azithro/ceftriaxone >9/28  Clindamycin >9/30     Subjective: Acute events overnight Lethargic this morning ABG pending Is arousable does answer questions  Objective: Vitals:   09/11/19 0458 09/11/19 1545 09/11/19 2052 09/12/19 0512  BP: (!) 98/58 104/64 109/65 (!) 105/59  Pulse: 75 67 68 64  Resp: 17 16 16 16   Temp: 99.3 F (37.4 C) 98.1 F (36.7 C) 99.2 F (37.3 C) 98.6 F (37 C)  TempSrc: Oral Oral Oral Oral  SpO2: 91% 94% 91% 93%  Weight:      Height:        Intake/Output Summary (Last 24 hours) at 09/12/2019 1000 Last data filed at 09/12/2019 1696 Gross per 24 hour  Intake 800 ml  Output 100 ml    Net 700 ml   Filed Weights   09/08/19 1517  Weight: (!) 152 kg    Examination:  General exam: mild lethargy, does awaken and answer questions Respiratory system: Clear to auscultation. Respiratory effort normal. Cardiovascular system: S1 & S2 heard, RRR. No JVD, murmurs, rubs, gallops or clicks. No pedal edema. Gastrointestinal system: Abdomen is nondistended, soft and nontender. No organomegaly or masses felt. Normal bowel sounds heard. Central nervous system: arouasable, No focal neurological deficits. Extremities: Warm well perfused, trace edema Skin: Draining labial ulceration, Psychiatry: Judgement and insight appear impaired, questionable cognitive deficit. Mood & affect flat    Data Reviewed: I have personally reviewed following labs and imaging studies  CBC: Recent Labs  Lab 09/08/19 1607 09/10/19 0559 09/11/19 0813 09/12/19 0531  WBC 21.0* 17.6* 18.1* 20.9*  NEUTROABS 16.8*  --  12.9*  --   HGB 9.7* 9.4* 8.8* 8.6*  HCT 31.0* 30.8* 27.4* 26.3*  MCV 87.8 89.5 86.7 85.7  PLT 222 211 244 789   Basic Metabolic Panel: Recent Labs  Lab 09/08/19 1607 09/10/19 0559 09/11/19 0813 09/12/19 0531  NA 133* 135 132* 132*  K 3.6 4.0 4.4 4.3  CL 100 100 100 99  CO2 23 22 17* 18*  GLUCOSE 268* 170* 104* 75  BUN 30* 53* 64* 77*  CREATININE 1.88* 4.13* 5.91* 6.90*  CALCIUM 7.3* 7.0* 6.7* 6.6*  PHOS  --   --   --  8.1*   GFR: Estimated Creatinine Clearance: 12.8 mL/min (A) (by C-G formula based on SCr of 6.9 mg/dL (H)). Liver Function Tests: Recent Labs  Lab 09/08/19 1607 09/10/19 0559 09/11/19 0813 09/12/19 0531  AST 33 52* 73*  --   ALT 32 32 42  --   ALKPHOS 183* 164* 166*  --   BILITOT 1.7* 1.0 1.0  --   PROT 7.2 6.7 6.2*  --   ALBUMIN 2.6* 2.1* 1.6* 1.4*   No results for  input(s): LIPASE, AMYLASE in the last 168 hours. No results for input(s): AMMONIA in the last 168 hours. Coagulation Profile: Recent Labs  Lab 09/08/19 1607  INR 1.2   Cardiac  Enzymes: Recent Labs  Lab 09/10/19 1054  CKTOTAL 732*   BNP (last 3 results) No results for input(s): PROBNP in the last 8760 hours. HbA1C: No results for input(s): HGBA1C in the last 72 hours. CBG: Recent Labs  Lab 09/11/19 0801 09/11/19 1220 09/11/19 1635 09/11/19 2044 09/12/19 0813  GLUCAP 102* 96 91 83 71   Lipid Profile: No results for input(s): CHOL, HDL, LDLCALC, TRIG, CHOLHDL, LDLDIRECT in the last 72 hours. Thyroid Function Tests: No results for input(s): TSH, T4TOTAL, FREET4, T3FREE, THYROIDAB in the last 72 hours. Anemia Panel: No results for input(s): VITAMINB12, FOLATE, FERRITIN, TIBC, IRON, RETICCTPCT in the last 72 hours. Sepsis Labs: Recent Labs  Lab 09/08/19 1607 09/08/19 1725  LATICACIDVEN 1.9 1.5    Recent Results (from the past 240 hour(s))  Urine culture     Status: Abnormal   Collection Time: 09/08/19  3:58 PM   Specimen: In/Out Cath Urine  Result Value Ref Range Status   Specimen Description   Final    IN/OUT CATH URINE Performed at [facility], [address], [place], [place] [address]    Special Requests   Final    NONE Performed at [facility], [address], [place], [place] [address]    Culture >=100,000 COLONIES/mL YEAST (A)  Final   Report Status 09/10/2019 FINAL  Final  SARS Coronavirus 2 [facility] order, Performed in [facility] hospital lab) Nasopharyngeal Nasopharyngeal Swab     Status: None   Collection Time: 09/08/19  3:59 PM   Specimen: Nasopharyngeal Swab  Result Value Ref Range Status   SARS Coronavirus 2 NEGATIVE NEGATIVE Final    Comment: (NOTE) If result is NEGATIVE SARS-CoV-2 target nucleic acids are NOT DETECTED. The SARS-CoV-2 RNA is generally detectable in upper and lower  respiratory specimens during the acute phase of infection. The lowest  concentration of SARS-CoV-2 viral copies this assay can detect is 250  copies / mL. A negative result does not preclude SARS-CoV-2 infection  and should not be used as  the sole basis for treatment or other  patient management decisions.  A negative result may occur with  improper specimen collection / handling, submission of specimen other  than nasopharyngeal swab, presence of viral mutation(s) within the  areas targeted by this assay, and inadequate number of viral copies  (<250 copies / mL). A negative result must be combined with clinical  observations, patient history, and epidemiological information. If result is POSITIVE SARS-CoV-2 target nucleic acids are DETECTED. The SARS-CoV-2 RNA is generally detectable in upper and lower  respiratory specimens dur ing the acute phase of infection.  Positive  results are indicative of active infection with SARS-CoV-2.  Clinical  correlation with patient history and other diagnostic information is  necessary to determine patient infection status.  Positive results do  not rule out bacterial infection or co-infection with other viruses. If result is PRESUMPTIVE POSTIVE SARS-CoV-2 nucleic acids MAY BE PRESENT.   A presumptive positive result was obtained on the submitted specimen  and confirmed on repeat testing.  While 2019 novel coronavirus  (SARS-CoV-2) nucleic acids may be present in the submitted sample  additional confirmatory testing may be necessary for epidemiological  and / or clinical management purposes  to differentiate between  SARS-CoV-2 and other Sarbecovirus currently known to infect humans.  If clinically indicated additional testing with an alternate test  methodology [phone]) is advised. The SARS-CoV-2 RNA is generally  detectable in upper and lower respiratory sp ecimens during the acute  phase of infection. The expected result is Negative. Fact Sheet for Patients:  StrictlyIdeas.no Fact Sheet for Healthcare Providers: BankingDealers.co.za This test is not yet approved or cleared by the [place] FDA and has been authorized for  detection and/or diagnosis of SARS-CoV-2 by FDA under an Emergency Use Authorization (EUA).  This EUA will remain in effect (meaning this test can be used) for the duration of the COVID-19 declaration under Section 564(b)(1) of the Act, 21 U.S.C. section 360bbb-3(b)(1), unless the authorization is terminated or revoked sooner. Performed at [facility], [address], [place], [place] [address]   Blood Culture (routine x 2)     Status: None (Preliminary result)   Collection Time: 09/08/19  4:07 PM   Specimen: BLOOD RIGHT WRIST  Result Value Ref Range Status   Specimen Description BLOOD RIGHT WRIST  Final   Special Requests   Final    BOTTLES DRAWN AEROBIC AND ANAEROBIC Blood Culture adequate volume   Culture   Final    NO GROWTH 4 DAYS Performed at [facility], [address], [place], [place] [address]    Report Status PENDING  Incomplete  Blood Culture (routine x 2)     Status: None (Preliminary result)   Collection Time: 09/08/19  4:17 PM   Specimen: BLOOD LEFT HAND  Result Value Ref Range Status   Specimen Description BLOOD LEFT HAND  Final   Special Requests   Final    BOTTLES DRAWN AEROBIC AND ANAEROBIC Blood Culture adequate volume   Culture   Final    NO GROWTH 4 DAYS Performed at [facility], [address], [place], [place] [address]    Report Status PENDING  Incomplete         Radiology Studies: Ct Abdomen Pelvis Wo Contrast  Result Date: 09/10/2019 CLINICAL DATA:  Left-sided labial swelling for 3-4 days. Possible abscess. EXAM: CT ABDOMEN AND PELVIS WITHOUT CONTRAST TECHNIQUE: Multidetector CT imaging of the abdomen and pelvis was performed following the standard protocol without IV contrast. COMPARISON:  None. FINDINGS: Lower chest: The heart is enlarged and there is a small pericardial effusion. Age advanced coronary artery calcifications are noted. Small bilateral pleural effusions with overlying atelectasis are noted. Hepatobiliary: No focal hepatic lesions  are identified without contrast. Moderate respiratory motion to the liver limits evaluation. The gallbladder is grossly normal. No obvious common bile duct dilatation. Pancreas: Limited examination due to motion artifact but no gross abnormality. Spleen: Normal size.  No focal lesions. Adrenals/Urinary Tract: The adrenal glands are grossly normal. No obvious renal calculi, hydronephrosis or mass. The bladder is grossly normal. Stomach/Bowel: Limited evaluation due to motion artifact. No gross bowel abnormality or obstructive findings. The terminal ileum and appendix appear normal. Vascular/Lymphatic: Moderate atherosclerotic calcifications involving the aorta and iliac arteries but no aneurysm. Scattered mesenteric and retroperitoneal lymph nodes without mass or overt adenopathy. Reproductive: Surgically absent. Other: Marked diffuse subcutaneous soft tissue swelling/edema/fluid and gas in the region of the mons pubis, left labia, perineum and suprapubic region. I do not see a discrete drainable fluid collection or fistula. No intrapelvic component is identified. Findings worrisome for Fournier gangrene. Adjacent inflamed/enlarged inguinal lymph nodes are noted. Musculoskeletal: No significant bony findings. I do not see any evidence of septic arthritis or osteomyelitis. IMPRESSION: 1. Extensive inflammatory/infectious process involving the mons pubis,  labia, perineum and suprapubic regions with extensive gas formation. Findings worrisome for Fournier's gangrene. No discrete drainable soft tissue abscess. 2. No intra-abdominal/intrapelvic abscess or obvious inflammatory process. 3. Small bilateral pleural effusions with overlying atelectasis. These results will be called to the ordering clinician or representative by the Radiologist Assistant, and communication documented in the PACS or zVision Dashboard. Electronically Signed   By: Marijo Sanes M.D.   On: 09/10/2019 12:05   US Renal  Result Date:  09/10/2019 CLINICAL DATA:  Acute kidney injury. EXAM: RENAL / URINARY TRACT ULTRASOUND COMPLETE COMPARISON:  November 30, 2009 FINDINGS: Right Kidney: Renal measurements: 13.1 x 5.2 x 5.3 cm = volume: 194 mL . Echogenicity within normal limits. No mass or hydronephrosis visualized. Left Kidney: Renal measurements: 13.5 x 5 x 5.3 cm = volume: 188 mL. Echogenicity within normal limits. No mass or hydronephrosis visualized. Bladder: Not visualized. IMPRESSION: 1. Body habitus limited study. 2. Given the above limitation, no acute abnormality was detected. No hydronephrosis. Electronically Signed   By: Constance Holster M.D.   On: 09/10/2019 10:09   Dg Chest Port 1 View  Result Date: 09/12/2019 CLINICAL DATA:  Reason for exam: PNA Patient non verbal throughout exam. Hx of LBBB, htn, diabetes, cad, chrf. Hx of coronary stent 2018. Ex smoker quit 2007. EXAM: PORTABLE CHEST 1 VIEW COMPARISON:  Chest radiograph 09/08/2019 FINDINGS: Stable cardiomegaly. Central venous congestion. There are persistent airspace opacities in the bilateral lower lungs concerning for infection. No pneumothorax or large pleural effusion. No acute finding in the visualized skeleton. IMPRESSION: Persistent bilateral lower lung airspace opacities suspicious for infection. Electronically Signed   By: Audie Pinto M.D.   On: 09/12/2019 08:43        Scheduled Meds:  aspirin EC  81 mg Oral Daily   atorvastatin  80 mg Oral QPM   Chlorhexidine Gluconate Cloth  6 each Topical Daily   clopidogrel  75 mg Oral q morning - 10a   feeding supplement (ENSURE ENLIVE)  237 mL Oral BID BM   heparin injection (subcutaneous)  5,000 Units Subcutaneous Q8H   insulin aspart  0-15 Units Subcutaneous TID WC   insulin aspart  0-5 Units Subcutaneous QHS   insulin glargine  25 Units Subcutaneous QHS   levothyroxine  125 mcg Oral Q0600   multivitamin with minerals  1 tablet Oral Daily   nystatin   Topical TID   sodium bicarbonate  650  mg Oral TID   Continuous Infusions:  ampicillin-sulbactam (UNASYN) IV     linezolid (ZYVOX) IV     sodium chloride       LOS: 4 days    Time spent: 35 min    Nicolette Bang, MD [facility]  If 7PM-7AM, please contact night-coverage  09/12/2019, 10:00 AM

## 2019-09-12 NOTE — Progress Notes (Signed)
We need to cancel surgery for tomorrow because patient is on Plavix.  Plavix will need to be filled for at least 3 days before proceeding with surgery.  Jackson Latino, [facility] Pager [phone]

## 2019-09-12 NOTE — Progress Notes (Signed)
[facility] Progress Note     Subjective: CC-  Sleeping well. Continues to have pain in perineum, no worse. WBC slightly up 20.9, TMAX 99.2.  Objective: Vital signs in last 24 hours: Temp:  [98.1 F (36.7 C)-99.2 F (37.3 C)] 98.6 F (37 C) (10/02 0512) Pulse Rate:  [64-68] 64 (10/02 0512) Resp:  [16] 16 (10/02 0512) BP: (104-109)/(59-65) 105/59 (10/02 0512) SpO2:  [91 %-94 %] 93 % (10/02 0512) Last BM Date: 09/03/19  Intake/Output from previous day: 10/01 0701 - 10/02 0700 In: 800 [IV Piggyback:800] Out: 100 [Urine:100] Intake/Output this shift: No intake/output data recorded.  PE: Gen:  Drowsy but arousable and answers questions, NAD HEENT: EOM's intact, pupils equal and round Card:  RRR Pulm:  CTAB, no W/R/R, effort normal Abd: obese, soft, NT/ND, +BS, no masses, hernias, or organomegaly Ext:  calves soft and nontender Skin: warm and dry GU: left labial abscess with 1cm opening and active purulent drainage, skin is soft around the opening, induration into mons pubis is slightly more extensive today and noted 2x2cm area of purplish discoloration, no crepitus  Lab Results:  Recent Labs    09/11/19 0813 09/12/19 0531  WBC 18.1* 20.9*  HGB 8.8* 8.6*  HCT 27.4* 26.3*  PLT 244 245   BMET Recent Labs    09/11/19 0813 09/12/19 0531  NA 132* 132*  K 4.4 4.3  CL 100 99  CO2 17* 18*  GLUCOSE 104* 75  BUN 64* 77*  CREATININE 5.91* 6.90*  CALCIUM 6.7* 6.6*   PT/INR No results for input(s): LABPROT, INR in the last 72 hours. CMP     Component Value Date/Time   NA 132 (L) 09/12/2019 0531   K 4.3 09/12/2019 0531   CL 99 09/12/2019 0531   CO2 18 (L) 09/12/2019 0531   GLUCOSE 75 09/12/2019 0531   BUN 77 (H) 09/12/2019 0531   CREATININE 6.90 (H) 09/12/2019 0531   CALCIUM 6.6 (L) 09/12/2019 0531   PROT 6.2 (L) 09/11/2019 0813   ALBUMIN 1.4 (L) 09/12/2019 0531   AST 73 (H) 09/11/2019 0813   ALT 42 09/11/2019 0813   ALKPHOS 166 (H) 09/11/2019  0813   BILITOT 1.0 09/11/2019 0813   GFRNONAA 6 (L) 09/12/2019 0531   GFRAA 7 (L) 09/12/2019 0531   Lipase  No results found for: LIPASE     Studies/Results: Ct Abdomen Pelvis Wo Contrast  Result Date: 09/10/2019 CLINICAL DATA:  Left-sided labial swelling for 3-4 days. Possible abscess. EXAM: CT ABDOMEN AND PELVIS WITHOUT CONTRAST TECHNIQUE: Multidetector CT imaging of the abdomen and pelvis was performed following the standard protocol without IV contrast. COMPARISON:  None. FINDINGS: Lower chest: The heart is enlarged and there is a small pericardial effusion. Age advanced coronary artery calcifications are noted. Small bilateral pleural effusions with overlying atelectasis are noted. Hepatobiliary: No focal hepatic lesions are identified without contrast. Moderate respiratory motion to the liver limits evaluation. The gallbladder is grossly normal. No obvious common bile duct dilatation. Pancreas: Limited examination due to motion artifact but no gross abnormality. Spleen: Normal size.  No focal lesions. Adrenals/Urinary Tract: The adrenal glands are grossly normal. No obvious renal calculi, hydronephrosis or mass. The bladder is grossly normal. Stomach/Bowel: Limited evaluation due to motion artifact. No gross bowel abnormality or obstructive findings. The terminal ileum and appendix appear normal. Vascular/Lymphatic: Moderate atherosclerotic calcifications involving the aorta and iliac arteries but no aneurysm. Scattered mesenteric and retroperitoneal lymph nodes without mass or overt adenopathy. Reproductive: Surgically absent. Other: Marked  diffuse subcutaneous soft tissue swelling/edema/fluid and gas in the region of the mons pubis, left labia, perineum and suprapubic region. I do not see a discrete drainable fluid collection or fistula. No intrapelvic component is identified. Findings worrisome for Fournier gangrene. Adjacent inflamed/enlarged inguinal lymph nodes are noted. Musculoskeletal:  No significant bony findings. I do not see any evidence of septic arthritis or osteomyelitis. IMPRESSION: 1. Extensive inflammatory/infectious process involving the mons pubis, labia, perineum and suprapubic regions with extensive gas formation. Findings worrisome for Fournier's gangrene. No discrete drainable soft tissue abscess. 2. No intra-abdominal/intrapelvic abscess or obvious inflammatory process. 3. Small bilateral pleural effusions with overlying atelectasis. These results will be called to the ordering clinician or representative by the Radiologist Assistant, and communication documented in the PACS or zVision Dashboard. Electronically Signed   By: Marijo Sanes M.D.   On: 09/10/2019 12:05   US Renal  Result Date: 09/10/2019 CLINICAL DATA:  Acute kidney injury. EXAM: RENAL / URINARY TRACT ULTRASOUND COMPLETE COMPARISON:  November 30, 2009 FINDINGS: Right Kidney: Renal measurements: 13.1 x 5.2 x 5.3 cm = volume: 194 mL . Echogenicity within normal limits. No mass or hydronephrosis visualized. Left Kidney: Renal measurements: 13.5 x 5 x 5.3 cm = volume: 188 mL. Echogenicity within normal limits. No mass or hydronephrosis visualized. Bladder: Not visualized. IMPRESSION: 1. Body habitus limited study. 2. Given the above limitation, no acute abnormality was detected. No hydronephrosis. Electronically Signed   By: Constance Holster M.D.   On: 09/10/2019 10:09    Anti-infectives: Anti-infectives (From admission, onward)   Start     Dose/Rate Route Frequency Ordered Stop   09/10/19 1430  clindamycin (CLEOCIN) IVPB 600 mg     600 mg 100 mL/hr over 30 Minutes Intravenous Every 6 hours 09/10/19 1410     09/10/19 0742  vancomycin variable dose per unstable renal function (pharmacist dosing)  Status:  Discontinued      Does not apply See admin instructions 09/10/19 0742 09/10/19 1413   09/10/19 0000  vancomycin (VANCOCIN) 1,750 mg in sodium chloride 0.9 % 500 mL IVPB  Status:  Discontinued     1,750  mg 250 mL/hr over 120 Minutes Intravenous Every 24 hours 09/09/19 0912 09/09/19 0928   09/10/19 0000  vancomycin (VANCOCIN) 1,750 mg in sodium chloride 0.9 % 500 mL IVPB  Status:  Discontinued     1,750 mg 250 mL/hr over 120 Minutes Intravenous Every 24 hours 09/09/19 0940 09/10/19 0751   09/09/19 1030  vancomycin (VANCOCIN) IVPB 750 mg/150 ml premix  Status:  Discontinued     750 mg 150 mL/hr over 60 Minutes Intravenous  Once 09/09/19 0928 09/09/19 0938   09/09/19 0930  vancomycin (VANCOCIN) IVPB 1000 mg/200 mL premix  Status:  Discontinued     1,000 mg 200 mL/hr over 60 Minutes Intravenous  Once 09/09/19 0928 09/09/19 0938   09/08/19 2300  azithromycin (ZITHROMAX) 500 mg in sodium chloride 0.9 % 250 mL IVPB     500 mg 250 mL/hr over 60 Minutes Intravenous Every 24 hours 09/08/19 2230     09/08/19 2300  cefTRIAXone (ROCEPHIN) 2 g in sodium chloride 0.9 % 100 mL IVPB     2 g 200 mL/hr over 30 Minutes Intravenous Every 24 hours 09/08/19 2230     09/08/19 2300  vancomycin (VANCOCIN) 1,500 mg in sodium chloride 0.9 % 500 mL IVPB     1,500 mg 250 mL/hr over 120 Minutes Intravenous  Once 09/08/19 2254 09/09/19 0434   09/08/19 1600  vancomycin (VANCOCIN) IVPB 1000 mg/200 mL premix     1,000 mg 200 mL/hr over 60 Minutes Intravenous  Once 09/08/19 1558 09/08/19 1929   09/08/19 1600  piperacillin-tazobactam (ZOSYN) IVPB 3.375 g     3.375 g 100 mL/hr over 30 Minutes Intravenous  Once 09/08/19 1558 09/08/19 1655       Assessment/Plan MO DM CAD with cardiac stents in place on plavix Hypothyroidism HLD HF with EF 35-40%  Left labial abscess - CT scan from 9/30 shows extensive inflammatory/infectious process involving the mons pubis, labia, perineum and suprapubic regions with extensive gas formation worrisome for Fournier's gangrene; no discrete drainable soft tissue abscess - Wound is still open and draining, no crepitus of the skin but induration does extend slightly further up into her  mons pubis today. No overt skin changes. Leukocytosis slightly worse but her vital signs are stable. I still do not that that she needs surgical debridement today, but could come to needing I&D. Continue broad spectrum antibiotics and wound care. Will make NPO after midnight in case surgery is needed tomorrow. Please hold plavix if possible.  ID - currently rocephin 9/28>>, azithromycin 9/28>>, clindamycin 9/30>> VTE - SCDs, sq heparin FEN - IVF, ok for diet today, NPO after midnight Foley - in place Follow up - TBD   LOS: 4 days    Wellington Hampshire , [facility] 09/12/2019, 8:15 AM Pager: [phone] Mon-Thurs 7:00 am-4:30 pm Fri 7:00 am -11:30 AM Sat-Sun 7:00 am-11:30 am

## 2019-09-12 NOTE — Progress Notes (Signed)
Pt alert, arousable, but sleepy mostofthe time, edema+4,less urine output,her sister wants to come up here but allowed only 1visitor, even I told them the update she was cursing ,at me, I informed the attending MD, she currently talking to the MD, pt refused to eat at this time.

## 2019-09-12 NOTE — Consult Note (Signed)
Date of Admission:  09/08/2019          Reason for Consult: Labial abscess with     Referring Provider: Dr. Wyonia Hough   Assessment:  1. Labial abscess, concern for Fournier's w sepsis and end organ failure including ARF 2. Vancomycin toxicity and likely contributor to her renal failure 3. Morbid obesity with obstructive sleep apnea and likely obesity hypoventilation syndrome 4. Poorly controlled diabetes mellitus  Plan:  1. Change antibiotics to unasyn and zyvox (latter for toxin inhibition) 2. I am confident she WILL require I and D for source control 3. ABG was done due to somnolence  Dr. Megan Salon to check on the patient over the weekend.   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 aspirin EC  81 mg Oral Daily  . atorvastatin  80 mg Oral QPM  . Chlorhexidine Gluconate Cloth  6 each Topical Daily  . clopidogrel  75 mg Oral q morning - 10a  . heparin injection (subcutaneous)  5,000 Units Subcutaneous Q8H  . insulin aspart  0-15 Units Subcutaneous TID WC  . insulin aspart  0-5 Units Subcutaneous QHS  . insulin glargine  25 Units Subcutaneous QHS  . levothyroxine  125 mcg Oral Q0600  . multivitamin with minerals  1 tablet Oral Daily  . nystatin   Topical TID  . Ensure Max Protein  11 oz Oral BID  . sodium bicarbonate  650 mg Oral TID   Continuous Infusions: . ampicillin-sulbactam (UNASYN) IV    . linezolid (ZYVOX) IV    . sodium chloride     PRN Meds:.acetaminophen, ipratropium-albuterol, morphine injection, oxyCODONE  HPI: Maureen Jackson is a 63 y.o. female with past medical  history significant for morbid obesity, poorly controlled diabetes mellitus hypothyroidism and obstructive sleep apnea presented [facility] at [facility] with weakness drowsiness due to difficult appetite.  She had just been seen in the ER 2 days prior to admission for an abscess in her left labia upper thigh.  It was draining somewhat and it was not incised or drained in the ER.  Unfortunately fluctuance worsened and she presented with sepsis and endorgan failure with acute renal failure.  Imaging of her abscess with CT scan shows: Extensive infection involving the mons pubis labia perineum and to prepubic areas with gas formation.  Surgery at [facility] declined to operate.  In the interim the patient's condition is continue to worsen with worsening renal failure in the context of vancomycin.  She is been transferred to [facility] for further evaluation and work-up and has been seen by general surgery here.  I do not think that her condition is going to be solvable without surgery.  We will use Unasyn to cover streptococcal species and anaerobes and add Zyvox and for toxin inhibition.  Dr. Megan Salon to check on the patient tomorrow.     Review of Systems: Review of Systems  Unable to perform ROS: Acuity of condition    Past Medical History:  Diagnosis Date  . 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 Bleeding from  mouth    when she brushed teeth or ate  . Chronic systolic CHF (congestive heart failure) ([place])   . Coronary artery disease 09/2017   a. multivessel CAD by cath in 09/2017 and not felt to be a CABG candidate --> underwent two-vessel PCI with DES to the LAD and DES to the RCA  . Diabetes mellitus   . Dyspnea   . Glaucoma   . Hypertension   . Left bundle branch block   . Morbid obesity ([place])   . Obstructive sleep apnea    does not wear CPAP  . Thyroid disease     Social History   Tobacco Use  . Smoking status: Former Smoker    Quit date: 12/14/2005    Years  since quitting: 13.7  . Smokeless tobacco: Never Used  . Tobacco comment: QUIT IN 2005  Substance Use Topics  . Alcohol use: No  . Drug use: No    Family History  Problem Relation Age of Onset  . Diabetes Mother   . Hypertension Mother   . Cancer Mother        pancreas  . Hypertension Sister    Allergies  Allergen Reactions  . Aspirin Nausea Only    OBJECTIVE: Blood pressure (!) 105/59, pulse 64, temperature 98.6 F (37 C), temperature source Oral, resp. rate 16, height 5\' 5"  (1.651 m), weight (!) 152 kg, SpO2 93 %.  Physical Exam Constitutional:      Appearance: She is obese. She is ill-appearing.  HENT:     Head: Normocephalic and atraumatic.     Nose: Nose normal.  Cardiovascular:     Pulses: Normal pulses.     Heart sounds: Normal heart sounds. No murmur. No friction rub.  Pulmonary:     Effort: No respiratory distress.     Breath sounds: Normal breath sounds. No stridor. No wheezing or rhonchi.  Abdominal:     General: Abdomen is flat. There is no distension.     Tenderness: There is no abdominal tenderness.  Neurological:     Mental Status: She is disoriented.    When she was examined is quite somnolent and difficult to arouse but can answer questions it when I yelled at her.  Labial abscess seen below September 12, 2019:        Lab Results Lab Results  Component Value Date   WBC 20.9 (H) 09/12/2019   HGB 8.6 (L) 09/12/2019   HCT 26.3 (L) 09/12/2019   MCV 85.7 09/12/2019   PLT 245 09/12/2019    Lab Results  Component Value Date   CREATININE 6.90 (H) 09/12/2019   BUN 77 (H) 09/12/2019   NA 132 (L) 09/12/2019   K 4.3 09/12/2019   CL 99 09/12/2019   CO2 18 (L) 09/12/2019    Lab Results  Component Value Date   ALT 42 09/11/2019   AST 73 (H) 09/11/2019   ALKPHOS 166 (H) 09/11/2019   BILITOT 1.0 09/11/2019     Microbiology: Recent Results (from the past 240 hour(s))  Urine culture     Status: Abnormal   Collection Time: 09/08/19  3:58 PM    Specimen: In/Out Cath Urine  Result Value Ref Range Status   Specimen Description   Final    IN/OUT CATH URINE Performed at [facility], [address], [place], [place] [address]    Special Requests   Final    NONE Performed at [facility], [address], [place], [place] [address]    Culture >=  100,000 COLONIES/mL YEAST (A)  Final   Report Status 09/10/2019 FINAL  Final  SARS Coronavirus 2 [facility] order, Performed in [facility] hospital lab) Nasopharyngeal Nasopharyngeal Swab     Status: None   Collection Time: 09/08/19  3:59 PM   Specimen: Nasopharyngeal Swab  Result Value Ref Range Status   SARS Coronavirus 2 NEGATIVE NEGATIVE Final    Comment: (NOTE) If result is NEGATIVE SARS-CoV-2 target nucleic acids are NOT DETECTED. The SARS-CoV-2 RNA is generally detectable in upper and lower  respiratory specimens during the acute phase of infection. The lowest  concentration of SARS-CoV-2 viral copies this assay can detect is 250  copies / mL. A negative result does not preclude SARS-CoV-2 infection  and should not be used as the sole basis for treatment or other  patient management decisions.  A negative result may occur with  improper specimen collection / handling, submission of specimen other  than nasopharyngeal swab, presence of viral mutation(s) within the  areas targeted by this assay, and inadequate number of viral copies  (<250 copies / mL). A negative result must be combined with clinical  observations, patient history, and epidemiological information. If result is POSITIVE SARS-CoV-2 target nucleic acids are DETECTED. The SARS-CoV-2 RNA is generally detectable in upper and lower  respiratory specimens dur ing the acute phase of infection.  Positive  results are indicative of active infection with SARS-CoV-2.  Clinical  correlation with patient history and other diagnostic information is  necessary to determine patient infection status.  Positive results do  not  rule out bacterial infection or co-infection with other viruses. If result is PRESUMPTIVE POSTIVE SARS-CoV-2 nucleic acids MAY BE PRESENT.   A presumptive positive result was obtained on the submitted specimen  and confirmed on repeat testing.  While 2019 novel coronavirus  (SARS-CoV-2) nucleic acids may be present in the submitted sample  additional confirmatory testing may be necessary for epidemiological  and / or clinical management purposes  to differentiate between  SARS-CoV-2 and other Sarbecovirus currently known to infect humans.  If clinically indicated additional testing with an alternate test  methodology [phone]) is advised. The SARS-CoV-2 RNA is generally  detectable in upper and lower respiratory sp ecimens during the acute  phase of infection. The expected result is Negative. Fact Sheet for Patients:  StrictlyIdeas.no Fact Sheet for Healthcare Providers: BankingDealers.co.za This test is not yet approved or cleared by the [place] FDA and has been authorized for detection and/or diagnosis of SARS-CoV-2 by FDA under an Emergency Use Authorization (EUA).  This EUA will remain in effect (meaning this test can be used) for the duration of the COVID-19 declaration under Section 564(b)(1) of the Act, 21 U.S.C. section 360bbb-3(b)(1), unless the authorization is terminated or revoked sooner. Performed at [facility], [address] [place], [place] [address]   Blood Culture (routine x 2)     Status: None (Preliminary result)   Collection Time: 09/08/19  4:07 PM   Specimen: BLOOD RIGHT WRIST  Result Value Ref Range Status   Specimen Description BLOOD RIGHT WRIST  Final   Special Requests   Final    BOTTLES DRAWN AEROBIC AND ANAEROBIC Blood Culture adequate volume   Culture   Final    NO GROWTH 4 DAYS Performed at [facility], [address], [place], [place] [address]    Report Status PENDING  Incomplete  Blood  Culture (routine x 2)     Status: None (Preliminary result)   Collection Time: 09/08/19  4:17 PM   Specimen: BLOOD LEFT HAND  Result Value Ref Range Status   Specimen Description BLOOD LEFT HAND  Final   Special Requests   Final    BOTTLES DRAWN AEROBIC AND ANAEROBIC Blood Culture adequate volume   Culture   Final    NO GROWTH 4 DAYS Performed at [facility], [address], [place], [place] [address]    Report Status PENDING  Incomplete    Alcide Evener, MD [facility] [phone] pager  09/12/2019, 1:25 PM

## 2019-09-13 ENCOUNTER — Encounter (HOSPITAL_COMMUNITY)
Admission: EM | Disposition: A | Discharge status: Skilled Nursing Facility | Payer: Self-pay | Source: Home / Self Care | Attending: Internal Medicine

## 2019-09-13 ENCOUNTER — Inpatient Hospital Stay (HOSPITAL_COMMUNITY): Payer: Medicare HMO

## 2019-09-13 LAB — CBC
HCT: 27.6 % — ABNORMAL LOW (ref 36.0–46.0)
Hemoglobin: 9.3 g/dL — ABNORMAL LOW (ref 12.0–15.0)
MCH: 28.4 pg (ref 26.0–34.0)
MCHC: 33.7 g/dL (ref 30.0–36.0)
MCV: 84.1 fL (ref 80.0–100.0)
Platelets: 269 10*3/uL (ref 150–400)
RBC: 3.28 MIL/uL — ABNORMAL LOW (ref 3.87–5.11)
RDW: 14.5 % (ref 11.5–15.5)
WBC: 20.2 10*3/uL — ABNORMAL HIGH (ref 4.0–10.5)
nRBC: 0.1 % (ref 0.0–0.2)

## 2019-09-13 LAB — GLUCOSE, CAPILLARY
Glucose-Capillary: 116 mg/dL — ABNORMAL HIGH (ref 70–99)
Glucose-Capillary: 133 mg/dL — ABNORMAL HIGH (ref 70–99)
Glucose-Capillary: 111 mg/dL — ABNORMAL HIGH (ref 70–99)
Glucose-Capillary: 82 mg/dL (ref 70–99)
Glucose-Capillary: 96 mg/dL (ref 70–99)

## 2019-09-13 LAB — RENAL FUNCTION PANEL
Albumin: 1.4 g/dL — ABNORMAL LOW (ref 3.5–5.0)
Anion gap: 17 — ABNORMAL HIGH (ref 5–15)
BUN: 88 mg/dL — ABNORMAL HIGH (ref 8–23)
CO2: 17 mmol/L — ABNORMAL LOW (ref 22–32)
Calcium: 6.5 mg/dL — ABNORMAL LOW (ref 8.9–10.3)
Chloride: 98 mmol/L (ref 98–111)
Creatinine, Ser: 7.42 mg/dL — ABNORMAL HIGH (ref 0.44–1.00)
GFR calc Af Amer: 6 mL/min — ABNORMAL LOW (ref >60)
GFR calc non Af Amer: 5 mL/min — ABNORMAL LOW (ref >60)
Glucose, Bld: 118 mg/dL — ABNORMAL HIGH (ref 70–99)
Phosphorus: 8.2 mg/dL — ABNORMAL HIGH (ref 2.5–4.6)
Potassium: 4.2 mmol/L (ref 3.5–5.1)
Sodium: 132 mmol/L — ABNORMAL LOW (ref 135–145)

## 2019-09-13 LAB — CULTURE, BLOOD (ROUTINE X 2)
Culture: NO GROWTH
Culture: NO GROWTH
Special Requests: ADEQUATE
Special Requests: ADEQUATE

## 2019-09-13 SURGERY — INCISION AND DRAINAGE, ABSCESS
Anesthesia: General

## 2019-09-13 MED ORDER — FUROSEMIDE 10 MG/ML IJ SOLN
INTRAMUSCULAR | Status: AC
  Filled 2019-09-13: qty 4

## 2019-09-13 MED ORDER — INSULIN GLARGINE 100 UNIT/ML ~~LOC~~ SOLN
10.0000 [IU] | Freq: Every day | SUBCUTANEOUS | Status: DC
  Administered 2019-09-13: 10 [IU] via SUBCUTANEOUS
  Filled 2019-09-13 (×2): qty 0.1

## 2019-09-13 MED ORDER — FUROSEMIDE 10 MG/ML IJ SOLN
80.0000 mg | Freq: Once | INTRAMUSCULAR | Status: AC
  Administered 2019-09-13: 80 mg via INTRAVENOUS

## 2019-09-13 NOTE — Progress Notes (Signed)
Only had a total of 150 cc urine output after 80 mg of IV Lasix, MD notified

## 2019-09-13 NOTE — Progress Notes (Signed)
PROGRESS NOTE    Maureen Jackson  NKN:[id number] DOB: [birth year] DOA: 09/08/2019 PCP: Neale Burly, MD    Brief Narrative:  63 year old female who presented with cough and weakness.  She does have significant past medical history for morbid obesity, systolic heart failure, hypothyroidism, hypertension, coronary artery disease and obstructive sleep apnea.  Reported generalized weakness, difficulty voiding, poor appetite and insomnia for about 3 days.  September 26, 2 days prior to hospitalization she was seen in the emergency department and a left labia upper thigh abscess was drained.  On her shoulder physical examination her temperature was 102.2, blood pressure 96/46, heart rate 85, respiratory 25, oxygen saturation 92%, moist mucous membranes, lungs clear to auscultation bilaterally, heart S1-S2 present and rhythmic, abdomen protuberant, no lower extremity edema.  3 x 3 cm indurated area with mild tenderness involving the left lower pubic/ vulva area.  Sodium 133, potassium 3.6, chloride 100, bicarb 23, glucose 268, BUN 30, creatinine 1.8, white count 21, hemoglobin 9.7, hematocrit 31.0, platelets 222.  SARS COVID-19 was negative.  Her chest radiograph had left rotation, positive cardiomegaly, bilateral interstitial infiltrates, predominantly right lower lobe.  Patient was admitted to the hospital with a working diagnosis of multifocal pneumonia complicated by left groin abscess and sepsis, present on admission.  Patient had worsening renal function in the setting of concerns for Fournier's gangrene, patient was transferred from [facility] to [facility] for further management.   Her renal function continue to deteriorate per serum Cr, acute ATN.   Assessment & Plan:   Principal Problem:   Fournier's gangrene in female Active Problems:   Hypothyroidism   Essential hypertension   Morbid obesity/BMI > 55   Diabetes mellitus type 2 with complications, uncontrolled (HCC)   CAD, multiple vessel   Ischemic  cardiomyopathy   Chronic combined systolic and diastolic CHF (congestive heart failure) (EF 30 to 35 %)   CAP (community acquired pneumonia)   Left genital labial abscess   1. Mons/ labial abscess/ Fournier's gangrene/ complicated with sepsis. Patient continue to have local pain, persistent leukocytosis at 20,2, no fever. Patient will need surgical debridement, once off clopidogrel. Will continue antibiotic therapy with linezolid and unasyn. Follow with cell count and cultures.  2. AKI with volume overload/ non gap metabolic acidosis. Suspected acute tubular necrosis. Worsening renal function with serum cr at 7,42, BUN 118 with serum bicarbonate at 17. K at 4,2 with Na at 132. Urine output documented 250 ml over last 24 H. Will order 80 mg IV furosemide x1 and follow with renal function in am. Case discussed with Dr. Posey Pronto from nephrology. Continue oral bicarbonate.   3. Acute on chronic systolic heart failure exacerbation. EF 30 to 35%. Positive volume overload with pulmonary edema (acute), follow up chest film personally reviewed. Will target negative fluid balance with furosemide.   4. Ischemic cardiomyopathy. SP DES to LAD and RCA. Will hold on clopidogrel and asa for potential surgical intervention. Patient  is chest pain free.   5. T2DM. Continue glucose cover and monitoring with insulin sliding scale,. In the setting of rapid worsening GFR, will hold decrease dose of basal insulin to prevent hypoglycemia.   6. Morbid obesity. Will encourage use of Cpap. Calculated BMI is 55.7.   7. Hypothyroid. Continue with levothyroxine.   DVT prophylaxis: enoxaparin   Code Status: full Family Communication: no family at the bedside  Disposition Plan/ discharge barriers: pending clinical improvement.   Body mass index is 55.75 kg/m. Malnutrition Type:  Nutrition Problem: Inadequate  oral intake Etiology: acute illness(pneumonia; abscess in left labia/upper thigh area)   Malnutrition  Characteristics:  Signs/Symptoms: meal completion < 25%   Nutrition Interventions:  Interventions: Ensure Enlive (each supplement provides 350kcal and 20 grams of protein), MVI  RN Pressure Injury Documentation:     Consultants:   Nephrology   Surgery   Procedures:     Antimicrobials:   Linezolid   Unasyn.     Subjective: Patient is somnolent this am, able to follow commands and easy to arouse, no nausea or vomiting, no chest pain. Positive pelvica and back pain. No dyspnea. Positive confusion.   Objective: Vitals:   09/11/19 2052 09/12/19 0512 09/12/19 2039 09/13/19 0415  BP: 109/65 (!) 105/59 126/64 123/64  Pulse: 68 64 66 67  Resp: 16 16 18 18   Temp: 99.2 F (37.3 C) 98.6 F (37 C) 98.6 F (37 C) 98.9 F (37.2 C)  TempSrc: Oral Oral Oral Oral  SpO2: 91% 93% 95% 96%  Weight:      Height:        Intake/Output Summary (Last 24 hours) at 09/13/2019 1000 Last data filed at 09/13/2019 0419 Gross per 24 hour  Intake 60 ml  Output 250 ml  Net -190 ml   Filed Weights   09/08/19 1517  Weight: (!) 152 kg    Examination:   General: Not in pain or dyspnea, deconditioned and ill looking appearing. Neurology: very somnolent, easy to arouse, follows commands, positive confusion.  E ENT: positive pallor, no icterus, oral mucosa moist Cardiovascular: No JVD. S1-S2 present, rhythmic, no gallops, rubs, or murmurs. No lower extremity edema. Pulmonary: positive breath sounds bilaterally, decreased air movement, no wheezing, rhonchi or rales. Gastrointestinal. Abdomen protuberant with no organomegaly, non tender, no rebound or guarding.  Skin. Pelvic erythema. Positive edema and local tenderness.  Musculoskeletal: no joint deformities     Data Reviewed: I have personally reviewed following labs and imaging studies  CBC: Recent Labs  Lab 09/08/19 1607 09/10/19 0559 09/11/19 0813 09/12/19 0531 09/13/19 0311  WBC 21.0* 17.6* 18.1* 20.9* 20.2*  NEUTROABS  16.8*  --  12.9*  --   --   HGB 9.7* 9.4* 8.8* 8.6* 9.3*  HCT 31.0* 30.8* 27.4* 26.3* 27.6*  MCV 87.8 89.5 86.7 85.7 84.1  PLT 222 211 244 245 960   Basic Metabolic Panel: Recent Labs  Lab 09/08/19 1607 09/10/19 0559 09/11/19 0813 09/12/19 0531 09/13/19 0311  NA 133* 135 132* 132* 132*  K 3.6 4.0 4.4 4.3 4.2  CL 100 100 100 99 98  CO2 23 22 17* 18* 17*  GLUCOSE 268* 170* 104* 75 118*  BUN 30* 53* 64* 77* 88*  CREATININE 1.88* 4.13* 5.91* 6.90* 7.42*  CALCIUM 7.3* 7.0* 6.7* 6.6* 6.5*  PHOS  --   --   --  8.1* 8.2*   GFR: Estimated Creatinine Clearance: 11.9 mL/min (A) (by C-G formula based on SCr of 7.42 mg/dL (H)). Liver Function Tests: Recent Labs  Lab 09/08/19 1607 09/10/19 0559 09/11/19 0813 09/12/19 0531 09/13/19 0311  AST 33 52* 73*  --   --   ALT 32 32 42  --   --   ALKPHOS 183* 164* 166*  --   --   BILITOT 1.7* 1.0 1.0  --   --   PROT 7.2 6.7 6.2*  --   --   ALBUMIN 2.6* 2.1* 1.6* 1.4* 1.4*   No results for input(s): LIPASE, AMYLASE in the last 168 hours. No results for input(s): AMMONIA  in the last 168 hours. Coagulation Profile: Recent Labs  Lab 09/08/19 1607  INR 1.2   Cardiac Enzymes: Recent Labs  Lab 09/10/19 1054  CKTOTAL 732*   BNP (last 3 results) No results for input(s): PROBNP in the last 8760 hours. HbA1C: No results for input(s): HGBA1C in the last 72 hours. CBG: Recent Labs  Lab 09/12/19 0813 09/12/19 1209 09/12/19 1613 09/12/19 2035 09/13/19 0819  GLUCAP 71 75 86 94 116*   Lipid Profile: No results for input(s): CHOL, HDL, LDLCALC, TRIG, CHOLHDL, LDLDIRECT in the last 72 hours. Thyroid Function Tests: No results for input(s): TSH, T4TOTAL, FREET4, T3FREE, THYROIDAB in the last 72 hours. Anemia Panel: No results for input(s): VITAMINB12, FOLATE, FERRITIN, TIBC, IRON, RETICCTPCT in the last 72 hours.    Radiology Studies: I have reviewed all of the imaging during this hospital visit personally     Scheduled Meds:  . atorvastatin  80 mg Oral QPM  . Chlorhexidine Gluconate Cloth  6 each Topical Daily  . heparin injection (subcutaneous)  5,000 Units Subcutaneous Q8H  . insulin aspart  0-15 Units Subcutaneous TID WC  . insulin aspart  0-5 Units Subcutaneous QHS  . insulin glargine  25 Units Subcutaneous QHS  . levothyroxine  125 mcg Oral Q0600  . multivitamin with minerals  1 tablet Oral Daily  . nystatin   Topical TID  . Ensure Max Protein  11 oz Oral BID  . sodium bicarbonate  650 mg Oral TID   Continuous Infusions: . ampicillin-sulbactam (UNASYN) IV 3 g (09/13/19 0032)  . dextrose 5 % and 0.45% NaCl 75 mL/hr at 09/13/19 0551  . linezolid (ZYVOX) IV 600 mg (09/13/19 0946)  . sodium chloride       LOS: 5 days        Maureen Messler Gerome Apley, MD

## 2019-09-13 NOTE — Progress Notes (Signed)
Not a rapid response event-  I was called by [facility] RN regarding concern for pts lethargy in the setting of sepsis.  CBG 96. No narcotics since 10/1 at 1101 (oxy).  Upon my arrival, Ms. Maureen Jackson woke as I said her name, she was able to follow several simple commands and even asked who I was.  Pt is very weak however has no focal deficits. Pt is OSA with nightly CPAP, however has been refusing it recently. I would recommend and encourage CPAP use every night.

## 2019-09-13 NOTE — Progress Notes (Signed)
Patient ID: Maureen Jackson, female   DOB: 1956-01-10, 63 y.o.   MRN: [id number] [facility] Progress Note   Assessment/ Plan:   1. Acute kidney Injury: Likely ATN from sepsis/vancomycin nephrotoxicity with differentials including AIN.  Low index of suspicion that this is an acute GN based on initial urinalysis/urine microscopy findings.  Urine output appearing to pick up with a smaller rise of creatinine noted overnight-possibly indicative of an approach to the plateau phase of ATN.  No acute dialysis needs. 2.  Hyponatremia: Secondary to acute kidney injury and impaired free water excretion.  Monitor daily labs. 3.  Anion gap metabolic acidosis: Secondary to acute kidney injury/SIRS from infection, continue sodium bicarbonate. 4.  Left labial abscess versus Fournier's gangrene: Earlier CT scan suggestive of Fournier's gangrene prompting transfer to [facility] for possible surgical intervention.  On examination found to be having local labial abscess with drainage-ongoing intravenous antibiotics with persistent leukocytosis with wound care/antibiotic treatment for which debridement/washout planned on Monday or Tuesday of next week. 5.  Anemia: Secondary to acute/critical illness, no overt loss.  Continue to monitor trend.  Subjective:   Complains of occasional breakthrough pain in the perineal area.  Currently comfortable   Objective:   BP 123/64 (BP Location: Left Wrist)   Pulse 67   Temp 98.9 F (37.2 C) (Oral)   Resp 18   Ht 5\' 5"  (1.651 m)   Wt (!) 152 kg   SpO2 96%   BMI 55.75 kg/m   Intake/Output Summary (Last 24 hours) at 09/13/2019 1201 Last data filed at 09/13/2019 0419 Gross per 24 hour  Intake 60 ml  Output 250 ml  Net -190 ml   Weight change:   Physical Exam: Gen: Morbidly obese woman, somnolent but awakens easily to calling out her name and engages in conversation CVS: Pulse regular rhythm, normal rate, S1 and S2 normal Resp: Clear to auscultation, no  rales/rhonchi Abd: Soft, obese, nontender, bowel sounds normal Ext: No lower extremity edema  Imaging: Dg Chest Port 1 View  Result Date: 09/13/2019 CLINICAL DATA:  Dyspnea EXAM: PORTABLE CHEST 1 VIEW COMPARISON:  09/12/2019 FINDINGS: Bilateral interstitial and patchy alveolar airspace opacities particularly of the at the lung bases. No pleural effusion or pneumothorax. Stable cardiomegaly. No acute osseous abnormality. IMPRESSION: Patchy bilateral lower lobe airspace disease most concerning for multilobar pneumonia. Electronically Signed   By: Kathreen Devoid   On: 09/13/2019 11:08   Dg Chest Port 1 View  Result Date: 09/12/2019 CLINICAL DATA:  Reason for exam: PNA Patient non verbal throughout exam. Hx of LBBB, htn, diabetes, cad, chrf. Hx of coronary stent 2018. Ex smoker quit 2007. EXAM: PORTABLE CHEST 1 VIEW COMPARISON:  Chest radiograph 09/08/2019 FINDINGS: Stable cardiomegaly. Central venous congestion. There are persistent airspace opacities in the bilateral lower lungs concerning for infection. No pneumothorax or large pleural effusion. No acute finding in the visualized skeleton. IMPRESSION: Persistent bilateral lower lung airspace opacities suspicious for infection. Electronically Signed   By: Audie Pinto M.D.   On: 09/12/2019 08:43   Labs: BMET Recent Labs  Lab 09/08/19 1607 09/10/19 0559 09/11/19 0813 09/12/19 0531 09/13/19 0311  NA 133* 135 132* 132* 132*  K 3.6 4.0 4.4 4.3 4.2  CL 100 100 100 99 98  CO2 23 22 17* 18* 17*  GLUCOSE 268* 170* 104* 75 118*  BUN 30* 53* 64* 77* 88*  CREATININE 1.88* 4.13* 5.91* 6.90* 7.42*  CALCIUM 7.3* 7.0* 6.7* 6.6* 6.5*  PHOS  --   --   --  8.1* 8.2*   CBC Recent Labs  Lab 09/08/19 1607 09/10/19 0559 09/11/19 0813 09/12/19 0531 09/13/19 0311  WBC 21.0* 17.6* 18.1* 20.9* 20.2*  NEUTROABS 16.8*  --  12.9*  --   --   HGB 9.7* 9.4* 8.8* 8.6* 9.3*  HCT 31.0* 30.8* 27.4* 26.3* 27.6*  MCV 87.8 89.5 86.7 85.7 84.1  PLT 222 211 244  245 269    Medications:    . atorvastatin  80 mg Oral QPM  . Chlorhexidine Gluconate Cloth  6 each Topical Daily  . heparin injection (subcutaneous)  5,000 Units Subcutaneous Q8H  . insulin aspart  0-15 Units Subcutaneous TID WC  . insulin glargine  10 Units Subcutaneous QHS  . levothyroxine  125 mcg Oral Q0600  . multivitamin with minerals  1 tablet Oral Daily  . nystatin   Topical TID  . Ensure Max Protein  11 oz Oral BID  . sodium bicarbonate  650 mg Oral TID   Elmarie Shiley, MD 09/13/2019, 12:01 PM

## 2019-09-13 NOTE — Progress Notes (Signed)
Called by pt's RN regarding CPAP order.  CPAP machine placed at bedside for PRN and HS use.  At this time pt is arousable and able to ask/answer questions.  Will continue to monitor for CPAP need during the day and place CPAP on pt at HS.

## 2019-09-13 NOTE — Progress Notes (Signed)
Patient ID: Maureen Jackson, female   DOB: 1956-03-19, 63 y.o.   MRN: [id number]         [facility]  Date of Admission:  09/08/2019   Total days of antibiotics 6        Day 2 ampicillin sulbactam        Day 2 linezolid         ASSESSMENT: She has extensive perineal infection compatible with Fournier's gangrene complicated by acute kidney injury.  Vancomycin may have contributed to the AKI.  Surgery has now been postponed for several more days because she has been on Plavix.  She is now afebrile.  I will continue current antibiotics.  PLAN: 1. Continue current antibiotics 2. Dr. Tommy Medal will follow-up on Monday, 09/15/2019.  Please call me for any infectious disease questions this weekend.  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 atorvastatin  80 mg Oral QPM  . Chlorhexidine Gluconate Cloth  6 each Topical Daily  . heparin injection (subcutaneous)  5,000 Units Subcutaneous Q8H  . insulin glargine  10 Units Subcutaneous QHS  . levothyroxine  125 mcg Oral Q0600  . multivitamin with minerals  1 tablet Oral Daily  . nystatin   Topical TID  . Ensure Max Protein  11 oz Oral BID  . sodium bicarbonate  650 mg Oral TID   Continuous Infusions: . ampicillin-sulbactam (UNASYN) IV 3 g (09/13/19 1128)  . linezolid (ZYVOX) IV 600 mg (09/13/19 0946)  . sodium chloride     PRN Meds:.acetaminophen, ipratropium-albuterol, morphine injection, oxyCODONE   SUBJECTIVE: She is still having pain.  Review of Systems: Review of Systems  Constitutional: Negative for fever.   Gastrointestinal: Negative for abdominal pain, diarrhea, nausea and vomiting.       Perineal pain.    Allergies  Allergen Reactions  . Aspirin Nausea Only    OBJECTIVE: Vitals:   09/11/19 2052 09/12/19 0512 09/12/19 2039 09/13/19 0415  BP: 109/65 (!) 105/59 126/64 123/64  Pulse: 68 64 66 67  Resp: 16 16 18 18   Temp: 99.2 F (37.3 C) 98.6 F (37 C) 98.6 F (37 C) 98.9 F (37.2 C)  TempSrc: Oral Oral Oral Oral  SpO2: 91% 93% 95% 96%  Weight:      Height:       Body mass index is 55.75 kg/m.  Physical Exam Constitutional:      Comments: She is drowsy.  Genitourinary:    Comments: This morning, general surgery noted some swelling, discoloration and drainage from her left labia.  I did not repeat the exam.    Lab Results Lab Results  Component Value Date   WBC 20.2 (H) 09/13/2019   HGB 9.3 (L) 09/13/2019   HCT 27.6 (L) 09/13/2019   MCV 84.1 09/13/2019   PLT 269 09/13/2019    Lab Results  Component Value Date   CREATININE 7.42 (H) 09/13/2019   BUN 88 (H) 09/13/2019   NA 132 (L) 09/13/2019   K 4.2 09/13/2019   CL 98 09/13/2019   CO2 17 (L) 09/13/2019    Lab Results  Component Value Date   ALT 42 09/11/2019   AST 73 (H) 09/11/2019   ALKPHOS 166 (H) 09/11/2019   BILITOT 1.0 09/11/2019     Microbiology: Recent Results (from the past 240 hour(s))  Urine culture     Status: Abnormal   Collection Time: 09/08/19  3:58 PM   Specimen: In/Out Cath Urine  Result Value Ref Range Status   Specimen Description   Final    IN/OUT CATH URINE Performed at [facility], [address], [place], [place] [address]    Special Requests   Final    NONE Performed at [facility], [address] [place],  [address]    Culture >=100,000 COLONIES/mL YEAST (A)  Final   Report Status 09/10/2019 FINAL  Final  SARS Coronavirus 2 [facility] order, Performed in [facility] hospital lab) Nasopharyngeal Nasopharyngeal Swab     Status: None   Collection Time: 09/08/19  3:59  PM   Specimen: Nasopharyngeal Swab  Result Value Ref Range Status   SARS Coronavirus 2 NEGATIVE NEGATIVE Final    Comment: (NOTE) If result is NEGATIVE SARS-CoV-2 target nucleic acids are NOT DETECTED. The SARS-CoV-2 RNA is generally detectable in upper and lower  respiratory specimens during the acute phase of infection. The lowest  concentration of SARS-CoV-2 viral copies this assay can detect is 250  copies / mL. A negative result does not preclude SARS-CoV-2 infection  and should not be used as the sole basis for treatment or other  patient management decisions.  A negative result may occur with  improper specimen collection / handling, submission of specimen other  than nasopharyngeal swab, presence of viral mutation(s) within the  areas targeted by this assay, and inadequate number of viral copies  (<250 copies / mL). A negative result must be combined with clinical  observations, patient history, and epidemiological information. If result is POSITIVE SARS-CoV-2 target nucleic acids are DETECTED. The SARS-CoV-2 RNA is generally detectable in upper and lower  respiratory specimens dur ing the acute phase of infection.  Positive  results are indicative of active infection with SARS-CoV-2.  Clinical  correlation with patient history and other diagnostic information is  necessary to determine patient infection status.  Positive results do  not rule out bacterial infection or co-infection with other viruses. If result is PRESUMPTIVE POSTIVE SARS-CoV-2 nucleic acids MAY BE PRESENT.   A presumptive positive result was obtained on the submitted specimen  and confirmed on repeat testing.  While 2019 novel coronavirus  (SARS-CoV-2) nucleic acids may be present in the submitted sample  additional confirmatory testing may be necessary for epidemiological  and / or clinical management purposes  to differentiate between  SARS-CoV-2 and other Sarbecovirus currently known to infect humans.   If clinically indicated additional testing with an alternate test  methodology [phone]) is advised. The SARS-CoV-2 RNA is generally  detectable in upper and lower respiratory sp ecimens during the acute  phase of infection. The expected result is Negative. Fact Sheet for Patients:  StrictlyIdeas.no Fact Sheet for Healthcare Providers: BankingDealers.co.za This test is not yet approved or cleared by the [place] FDA and has been authorized for detection and/or diagnosis of SARS-CoV-2 by FDA under an Emergency Use Authorization (EUA).  This EUA will remain in effect (meaning this test can be used) for the duration of the COVID-19 declaration under Section 564(b)(1) of the Act, 21 U.S.C. section 360bbb-3(b)(1), unless the authorization is terminated or revoked sooner. Performed at [facility], [address], [place],  [place] [address]   Blood Culture (routine x 2)     Status: None   Collection Time: 09/08/19  4:07 PM   Specimen: BLOOD RIGHT WRIST  Result Value Ref Range Status   Specimen Description BLOOD RIGHT WRIST  Final   Special Requests   Final    BOTTLES DRAWN AEROBIC AND ANAEROBIC Blood Culture adequate volume   Culture   Final    NO GROWTH 5 DAYS Performed at [facility], [address], [place], [place] [address]    Report Status 09/13/2019 FINAL  Final  Blood Culture (routine x 2)     Status: None   Collection Time: 09/08/19  4:17 PM   Specimen: BLOOD LEFT HAND  Result Value Ref Range Status   Specimen Description BLOOD LEFT HAND  Final   Special Requests   Final    BOTTLES DRAWN AEROBIC AND ANAEROBIC Blood Culture adequate volume   Culture   Final    NO GROWTH 5 DAYS Performed at [facility], [address] [place], [place] [address]    Report Status 09/13/2019 FINAL  Final    Michel Bickers, MD [place] [place] [facility] [phone] pager   [phone] cell  09/13/2019, 12:48 PM

## 2019-09-13 NOTE — Progress Notes (Signed)
Subjective: CC: Continues to have pain in her perineum. T max 98.9. WBC stable at 20.2 from 20.9  Objective: Vital signs in last 24 hours: Temp:  [98.6 F (37 C)-98.9 F (37.2 C)] 98.9 F (37.2 C) (10/03 0415) Pulse Rate:  [66-67] 67 (10/03 0415) Resp:  [18] 18 (10/03 0415) BP: (123-126)/(64) 123/64 (10/03 0415) SpO2:  [95 %-96 %] 96 % (10/03 0415) Last BM Date: 09/03/19  Intake/Output from previous day: 10/02 0701 - 10/03 0700 In: 60 [P.O.:60] Out: 250 [Urine:250] Intake/Output this shift: No intake/output data recorded.  PE: Gen:  Drowsy but arousable and answers questions, NAD Lungs; Normal rate and effort  Abd: obese, soft, NT/ND, +BS, no masses, hernias, or organomegaly Ext:  calves soft and nontender. 1+ edema b/l Skin: warm and dry GU: left labial abscess with 1cm opening and active purulent drainage, skin is soft around the opening, induration into mons pubis with 2x2cm area of purplish discoloration, no crepitus.  Lab Results:   Recent Labs    09/12/19 0531 09/13/19 0311  WBC 20.9* 20.2*  HGB 8.6* 9.3*  HCT 26.3* 27.6*  PLT 245 269   BMET Recent Labs    09/12/19 0531 09/13/19 0311  NA 132* 132*  K 4.3 4.2  CL 99 98  CO2 18* 17*  GLUCOSE 75 118*  BUN 77* 88*  CREATININE 6.90* 7.42*  CALCIUM 6.6* 6.5*   PT/INR No results for input(s): LABPROT, INR in the last 72 hours. CMP     Component Value Date/Time   NA 132 (L) 09/13/2019 0311   K 4.2 09/13/2019 0311   CL 98 09/13/2019 0311   CO2 17 (L) 09/13/2019 0311   GLUCOSE 118 (H) 09/13/2019 0311   BUN 88 (H) 09/13/2019 0311   CREATININE 7.42 (H) 09/13/2019 0311   CALCIUM 6.5 (L) 09/13/2019 0311   PROT 6.2 (L) 09/11/2019 0813   ALBUMIN 1.4 (L) 09/13/2019 0311   AST 73 (H) 09/11/2019 0813   ALT 42 09/11/2019 0813   ALKPHOS 166 (H) 09/11/2019 0813   BILITOT 1.0 09/11/2019 0813   GFRNONAA 5 (L) 09/13/2019 0311   GFRAA 6 (L) 09/13/2019 0311   Lipase  No results found for: LIPASE     Studies/Results: Dg Chest Port 1 View  Result Date: 09/12/2019 CLINICAL DATA:  Reason for exam: PNA Patient non verbal throughout exam. Hx of LBBB, htn, diabetes, cad, chrf. Hx of coronary stent 2018. Ex smoker quit 2007. EXAM: PORTABLE CHEST 1 VIEW COMPARISON:  Chest radiograph 09/08/2019 FINDINGS: Stable cardiomegaly. Central venous congestion. There are persistent airspace opacities in the bilateral lower lungs concerning for infection. No pneumothorax or large pleural effusion. No acute finding in the visualized skeleton. IMPRESSION: Persistent bilateral lower lung airspace opacities suspicious for infection. Electronically Signed   By: Audie Pinto M.D.   On: 09/12/2019 08:43    Anti-infectives: Anti-infectives (From admission, onward)   Start     Dose/Rate Route Frequency Ordered Stop   09/12/19 1100  linezolid (ZYVOX) IVPB 600 mg     600 mg 300 mL/hr over 60 Minutes Intravenous Every 12 hours 09/12/19 0945     09/12/19 1100  Ampicillin-Sulbactam (UNASYN) 3 g in sodium chloride 0.9 % 100 mL IVPB     3 g 200 mL/hr over 30 Minutes Intravenous Every 12 hours 09/12/19 0945     09/10/19 1430  clindamycin (CLEOCIN) IVPB 600 mg  Status:  Discontinued     600 mg 100 mL/hr over 30 Minutes  Intravenous Every 6 hours 09/10/19 1410 09/12/19 0945   09/10/19 0742  vancomycin variable dose per unstable renal function (pharmacist dosing)  Status:  Discontinued      Does not apply See admin instructions 09/10/19 0742 09/10/19 1413   09/10/19 0000  vancomycin (VANCOCIN) 1,750 mg in sodium chloride 0.9 % 500 mL IVPB  Status:  Discontinued     1,750 mg 250 mL/hr over 120 Minutes Intravenous Every 24 hours 09/09/19 0912 09/09/19 0928   09/10/19 0000  vancomycin (VANCOCIN) 1,750 mg in sodium chloride 0.9 % 500 mL IVPB  Status:  Discontinued     1,750 mg 250 mL/hr over 120 Minutes Intravenous Every 24 hours 09/09/19 0940 09/10/19 0751   09/09/19 1030  vancomycin (VANCOCIN) IVPB 750 mg/150 ml premix   Status:  Discontinued     750 mg 150 mL/hr over 60 Minutes Intravenous  Once 09/09/19 0928 09/09/19 0938   09/09/19 0930  vancomycin (VANCOCIN) IVPB 1000 mg/200 mL premix  Status:  Discontinued     1,000 mg 200 mL/hr over 60 Minutes Intravenous  Once 09/09/19 0928 09/09/19 0938   09/08/19 2300  azithromycin (ZITHROMAX) 500 mg in sodium chloride 0.9 % 250 mL IVPB  Status:  Discontinued     500 mg 250 mL/hr over 60 Minutes Intravenous Every 24 hours 09/08/19 2230 09/12/19 0945   09/08/19 2300  cefTRIAXone (ROCEPHIN) 2 g in sodium chloride 0.9 % 100 mL IVPB  Status:  Discontinued     2 g 200 mL/hr over 30 Minutes Intravenous Every 24 hours 09/08/19 2230 09/12/19 0945   09/08/19 2300  vancomycin (VANCOCIN) 1,500 mg in sodium chloride 0.9 % 500 mL IVPB     1,500 mg 250 mL/hr over 120 Minutes Intravenous  Once 09/08/19 2254 09/09/19 0434   09/08/19 1600  vancomycin (VANCOCIN) IVPB 1000 mg/200 mL premix     1,000 mg 200 mL/hr over 60 Minutes Intravenous  Once 09/08/19 1558 09/08/19 1929   09/08/19 1600  piperacillin-tazobactam (ZOSYN) IVPB 3.375 g     3.375 g 100 mL/hr over 30 Minutes Intravenous  Once 09/08/19 1558 09/08/19 1655       Assessment/Plan MO DM CAD with cardiac stents in place on plavix Hypothyroidism HLD HF with EF 35-40% ARF   Left labial abscess - CT scan from 9/30 shows extensive inflammatory/infectious process involving the mons pubis, labia, perineum and suprapubic regions with extensive gas formation worrisome for Fournier's gangrene; no discrete drainable soft tissue abscess - Wound is still open and draining. Patient will need Plavix held prior to debridement int he OR. No change on exam today per note compared to yesterday. Her WBC is stable. Continue broad spectrum antibiotics per ID and wound care for now   ID Rocephin 9/28-10/2, azithromycin 9/28-10/2, clindamycin 9/30-10/2. Currently on Zyvox and Unasyn 10/2 >> VTE -SCDs, sq heparin, please hold plavix  for possibility of OR in the future  FEN -IVF, CM  Foley -in place Follow up -TBD.   LOS: 5 days    Jillyn Ledger , [facility] Surgery 09/13/2019, 9:24 AM Pager: [phone]

## 2019-09-14 ENCOUNTER — Encounter (HOSPITAL_COMMUNITY): Payer: Self-pay

## 2019-09-14 LAB — CBC WITH DIFFERENTIAL/PLATELET
Abs Immature Granulocytes: 1.22 10*3/uL — ABNORMAL HIGH (ref 0.00–0.07)
Basophils Absolute: 0.1 10*3/uL (ref 0.0–0.1)
Basophils Relative: 0 %
Eosinophils Absolute: 0.4 10*3/uL (ref 0.0–0.5)
Eosinophils Relative: 2 %
HCT: 25.1 % — ABNORMAL LOW (ref 36.0–46.0)
Hemoglobin: 8.1 g/dL — ABNORMAL LOW (ref 12.0–15.0)
Immature Granulocytes: 5 %
Lymphocytes Relative: 9 %
Lymphs Abs: 2.1 10*3/uL (ref 0.7–4.0)
MCH: 27.6 pg (ref 26.0–34.0)
MCHC: 32.3 g/dL (ref 30.0–36.0)
MCV: 85.4 fL (ref 80.0–100.0)
Monocytes Absolute: 0.9 10*3/uL (ref 0.1–1.0)
Monocytes Relative: 4 %
Neutro Abs: 18.1 10*3/uL — ABNORMAL HIGH (ref 1.7–7.7)
Neutrophils Relative %: 80 %
Platelets: 343 10*3/uL (ref 150–400)
RBC: 2.94 MIL/uL — ABNORMAL LOW (ref 3.87–5.11)
RDW: 14.8 % (ref 11.5–15.5)
WBC: 22.7 10*3/uL — ABNORMAL HIGH (ref 4.0–10.5)
nRBC: 0.1 % (ref 0.0–0.2)

## 2019-09-14 LAB — BLOOD GAS, ARTERIAL
Acid-base deficit: 7.5 mmol/L — ABNORMAL HIGH (ref 0.0–2.0)
Bicarbonate: 16.8 mmol/L — ABNORMAL LOW (ref 20.0–28.0)
Drawn by: 54887
O2 Content: 2 L/min
O2 Saturation: 96.2 %
Patient temperature: 98.6
pCO2 arterial: 30.3 mmHg — ABNORMAL LOW (ref 32.0–48.0)
pH, Arterial: 7.363 (ref 7.350–7.450)
pO2, Arterial: 89.9 mmHg (ref 83.0–108.0)

## 2019-09-14 LAB — RENAL FUNCTION PANEL
Albumin: 1.3 g/dL — ABNORMAL LOW (ref 3.5–5.0)
Anion gap: 18 — ABNORMAL HIGH (ref 5–15)
BUN: 96 mg/dL — ABNORMAL HIGH (ref 8–23)
CO2: 17 mmol/L — ABNORMAL LOW (ref 22–32)
Calcium: 5.9 mg/dL — CL (ref 8.9–10.3)
Chloride: 97 mmol/L — ABNORMAL LOW (ref 98–111)
Creatinine, Ser: 7.53 mg/dL — ABNORMAL HIGH (ref 0.44–1.00)
GFR calc Af Amer: 6 mL/min — ABNORMAL LOW (ref >60)
GFR calc non Af Amer: 5 mL/min — ABNORMAL LOW (ref >60)
Glucose, Bld: 83 mg/dL (ref 70–99)
Phosphorus: 9.3 mg/dL — ABNORMAL HIGH (ref 2.5–4.6)
Potassium: 4.9 mmol/L (ref 3.5–5.1)
Sodium: 132 mmol/L — ABNORMAL LOW (ref 135–145)

## 2019-09-14 LAB — GLUCOSE, CAPILLARY
Glucose-Capillary: 85 mg/dL (ref 70–99)
Glucose-Capillary: 97 mg/dL (ref 70–99)
Glucose-Capillary: 120 mg/dL — ABNORMAL HIGH (ref 70–99)
Glucose-Capillary: 68 mg/dL — ABNORMAL LOW (ref 70–99)
Glucose-Capillary: 93 mg/dL (ref 70–99)

## 2019-09-14 MED ORDER — ALBUMIN HUMAN 25 % IV SOLN
12.5000 g | Freq: Once | INTRAVENOUS | Status: AC
  Administered 2019-09-14: 12.5 g via INTRAVENOUS
  Filled 2019-09-14: qty 50

## 2019-09-14 MED ORDER — ALBUMIN HUMAN 25 % IV SOLN: 12.5000 g | Freq: Once | INTRAVENOUS | Status: DC

## 2019-09-14 MED ORDER — FUROSEMIDE 10 MG/ML IJ SOLN: 120.0000 mg | Freq: Once | INTRAVENOUS | Status: DC

## 2019-09-14 MED ORDER — DEXTROSE 50 % IV SOLN
INTRAVENOUS | Status: AC
  Administered 2019-09-14: 25 mL
  Filled 2019-09-14: qty 50

## 2019-09-14 MED ORDER — CALCIUM ACETATE (PHOS BINDER) 667 MG PO CAPS
1334.0000 mg | ORAL_CAPSULE | Freq: Three times a day (TID) | ORAL | Status: DC
  Administered 2019-09-15 – 2019-09-19 (×9): 1334 mg via ORAL
  Filled 2019-09-14 (×12): qty 2

## 2019-09-14 MED ORDER — FUROSEMIDE 10 MG/ML IJ SOLN
120.0000 mg | Freq: Once | INTRAVENOUS | Status: AC
  Administered 2019-09-14 (×2): 120 mg via INTRAVENOUS
  Filled 2019-09-14: qty 10

## 2019-09-14 MED ORDER — FUROSEMIDE 10 MG/ML IJ SOLN: 80.0000 mg | Freq: Four times a day (QID) | INTRAMUSCULAR | Status: DC

## 2019-09-14 MED ORDER — NALOXONE HCL 0.4 MG/ML IJ SOLN
INTRAMUSCULAR | Status: AC
  Administered 2019-09-14: 0.4 mg
  Filled 2019-09-14: qty 1

## 2019-09-14 MED ORDER — CALCIUM GLUCONATE-NACL 1-0.675 GM/50ML-% IV SOLN
1.0000 g | Freq: Once | INTRAVENOUS | Status: AC
  Administered 2019-09-14: 1000 mg via INTRAVENOUS
  Filled 2019-09-14: qty 50

## 2019-09-14 MED ORDER — NALOXONE HCL 0.4 MG/ML IJ SOLN: 0.4000 mg | INTRAMUSCULAR | Status: DC | PRN

## 2019-09-14 MED ORDER — FUROSEMIDE 10 MG/ML IJ SOLN
120.0000 mg | Freq: Once | INTRAVENOUS | Status: AC
  Filled 2019-09-14: qty 12

## 2019-09-14 MED ORDER — MORPHINE SULFATE (PF) 2 MG/ML IV SOLN: 1.0000 mg | INTRAVENOUS | Status: DC | PRN

## 2019-09-14 MED ORDER — OXYCODONE HCL 5 MG PO TABS: 5.0000 mg | ORAL_TABLET | Freq: Four times a day (QID) | ORAL | Status: DC | PRN

## 2019-09-14 NOTE — Progress Notes (Signed)
Dr. Cathlean Sauer is notified about pt's critical calcium level 5.9. New orders received. Continue to monitor the patient.

## 2019-09-14 NOTE — Progress Notes (Signed)
Pt's VS are stable but pt very drowsy and weak. This RN notified CN and MD. CN called RRT just to check on pt.  Pt was able to follow commands but very weak. Will continue to monitor.

## 2019-09-14 NOTE — Progress Notes (Signed)
Hypoglycemic Event  CBG: 68  Treatment: 67mL Dextrose50 admin  Symptoms: pt lethargic  Follow-up CBG: Time: 1718 CBG Result: 120  Possible Reasons for Event: unknown  Comments/MD notified: Sander Radon, MD paged   Will continue to monitor.    Racheal Patches, RN

## 2019-09-14 NOTE — Progress Notes (Signed)
PROGRESS NOTE    Maureen Jackson  FGH:[id number] DOB: Jun 07, 1956 DOA: 09/08/2019 PCP: Neale Burly, MD    Brief Narrative:  63 year old female who presented with cough and weakness.  She does have significant past medical history for morbid obesity, systolic heart failure, hypothyroidism, hypertension, coronary artery disease and obstructive sleep apnea.  Reported generalized weakness, difficulty voiding, poor appetite and insomnia for about 3 days.  September 26, 2 days prior to hospitalization she was seen in the emergency department and a left labia upper thigh abscess was drained.  On her shoulder physical examination her temperature was 102.2, blood pressure 96/46, heart rate 85, respiratory 25, oxygen saturation 92%, moist mucous membranes, lungs clear to auscultation bilaterally, heart S1-S2 present and rhythmic, abdomen protuberant, no lower extremity edema.  3 x 3 cm indurated area with mild tenderness involving the left lower pubic/ vulva area.  Sodium 133, potassium 3.6, chloride 100, bicarb 23, glucose 268, BUN 30, creatinine 1.8, white count 21, hemoglobin 9.7, hematocrit 31.0, platelets 222.  SARS COVID-19 was negative.  Her chest radiograph had left rotation, positive cardiomegaly, bilateral interstitial infiltrates, predominantly right lower lobe.  Patient was admitted to the hospital with a working diagnosis of multifocal pneumonia complicated by left groin abscess and sepsis, present on admission.  Patient had worsening renal function in the setting of concerns for Fournier's gangrene, patient was transferred from [facility] to [facility] for further management.   Her renal function continue to deteriorate per serum Cr, acute ATN. Oliguric renal failure with signs of volume overload.      Assessment & Plan:   Principal Problem:   Fournier's gangrene in female Active Problems:   Hypothyroidism   Essential hypertension   Morbid obesity/BMI > 55   Diabetes mellitus type 2 with  complications, uncontrolled (HCC)   CAD, multiple vessel   Ischemic cardiomyopathy   Chronic combined systolic and diastolic CHF (congestive heart failure) (EF 30 to 35 %)   CAP (community acquired pneumonia)   Left genital labial abscess     1. Mons/ labial abscess/ Fournier's gangrene/ complicated with sepsis/ acute metabolic encephalopathy. Worsening leukocytosis at 22.7. Patient has been somnolent, no recent use of analgesics. Continue with antibiotic linezolid and unasyn. ASA and clopidogrel have been held for surgical debridement.  Follow with surgery recommendations. Will decrease dose of opiate analgesics as needed.   2. AKI with volume overload/ non gap metabolic acidosis. Suspected acute tubular necrosis/ oliguria/ hypocalcemia/ hyperphosphatemia. Persistent worsening renal function with serum cr at 7,53, BUN 96 with serum bicarbonate at 17. K at 4,9 with Na at 132. P up to 18 and Ca down to 5,9.  Patient with signs of volume overload with edema and pulmonary edema, had 80 mg of IV furosemide with urine output 125 ml over last 24 H. Will order 80 mg IV q6 H x2 doses and continue to follow on urine output. Continue oral bicarbonate. Will add phosphate binders and calcium gluconate. Continue telemetry monitoring. Follow with nephrology recommendations.   Patient may need renal replacement therapy on this hospitalization. Volume and electrolytes will need to be optimize for potential surgical procedure.   3. Acute on chronic systolic heart failure exacerbation. EF 30 to 35%. Volume overload with pulmonary edema (acute), oxygenation is 99% on 2 LPM, patient with very poor urine output. Will continue furosemide trial.   4. Ischemic cardiomyopathy. SP DES to LAD and RCA 2018. Holding antiplatelet therapy for now, for surgical procedure.  5. T2DM. Fasting glucose this am is  83, insulin dose has been reduced to prevent hypoglycemia. On insulin sliding scale and basal insulin with 10 units  qhs.   6. Morbid obesity. Continue Cpap. Calculated BMI is 55.7.   7. Hypothyroid. On evothyroxine.   Considering patient's clinical presentation, with worsening renal failure and pulmonary edema, no cough or fever, with sepsis due to Fournier's gangrene, pneumonia has been ruled out.   DVT prophylaxis: enoxaparin   Code Status: full Family Communication: no family at the bedside  Disposition Plan/ discharge barriers: pending clinical improvement.    Body mass index is 55.75 kg/m. Malnutrition Type:  Nutrition Problem: Inadequate oral intake Etiology: acute illness(pneumonia; abscess in left labia/upper thigh area)   Malnutrition Characteristics:  Signs/Symptoms: meal completion < 25%   Nutrition Interventions:  Interventions: Ensure Enlive (each supplement provides 350kcal and 20 grams of protein), MVI  RN Pressure Injury Documentation:     Consultants:   Nephrology   Surgery   Procedures:     Antimicrobials:   linezolid  Ceftriaxone      Subjective: Patient continue to be very somnolent today, follows commands and responds to simple questions. Lethargic but not confused. Continue to have very poor urine output.   Objective: Vitals:   09/13/19 2124 09/13/19 2243 09/14/19 0442 09/14/19 0634  BP: 121/69 110/65 125/64 116/69  Pulse: 66 66 67 65  Resp: (!) 24 20 20 18   Temp: 98.6 F (37 C) 99 F (37.2 C) 98.3 F (36.8 C) 98.4 F (36.9 C)  TempSrc: Axillary Axillary Oral Oral  SpO2: 99% 98% 97% 99%  Weight:      Height:        Intake/Output Summary (Last 24 hours) at 09/14/2019 0936 Last data filed at 09/14/2019 0804 Gross per 24 hour  Intake 1600 ml  Output 375 ml  Net 1225 ml   Filed Weights   09/08/19 1517  Weight: (!) 152 kg    Examination:   General: Not in pain or dyspnea, deconditioned and ill looking appearing. Neurology: very somnolent, follows commands and answers to simple questions.  E ENT: no pallor, no icterus, oral  mucosa dry.  Cardiovascular: No JVD. S1-S2 present, rhythmic, no gallops, rubs, or murmurs. +++ non pitting lower extremity edema. Pulmonary: positive breath sounds bilaterally, no wheezing, rhonchi or rales. Decreased breaths sounds at the dependent zones.  Gastrointestinal. Abdomen protuberant with no organomegaly, non tender, no rebound or guarding/ edematous and tender pubic region.  Skin. No rashes Musculoskeletal: no joint deformities     Data Reviewed: I have personally reviewed following labs and imaging studies  CBC: Recent Labs  Lab 09/08/19 1607 09/10/19 0559 09/11/19 0813 09/12/19 0531 09/13/19 0311 09/14/19 0815  WBC 21.0* 17.6* 18.1* 20.9* 20.2* 22.7*  NEUTROABS 16.8*  --  12.9*  --   --  PENDING  HGB 9.7* 9.4* 8.8* 8.6* 9.3* 8.1*  HCT 31.0* 30.8* 27.4* 26.3* 27.6* 25.1*  MCV 87.8 89.5 86.7 85.7 84.1 85.4  PLT 222 211 244 245 269 812   Basic Metabolic Panel: Recent Labs  Lab 09/08/19 1607 09/10/19 0559 09/11/19 0813 09/12/19 0531 09/13/19 0311  NA 133* 135 132* 132* 132*  K 3.6 4.0 4.4 4.3 4.2  CL 100 100 100 99 98  CO2 23 22 17* 18* 17*  GLUCOSE 268* 170* 104* 75 118*  BUN 30* 53* 64* 77* 88*  CREATININE 1.88* 4.13* 5.91* 6.90* 7.42*  CALCIUM 7.3* 7.0* 6.7* 6.6* 6.5*  PHOS  --   --   --  8.1* 8.2*  GFR: Estimated Creatinine Clearance: 11.9 mL/min (A) (by C-G formula based on SCr of 7.42 mg/dL (H)). Liver Function Tests: Recent Labs  Lab 09/08/19 1607 09/10/19 0559 09/11/19 0813 09/12/19 0531 09/13/19 0311  AST 33 52* 73*  --   --   ALT 32 32 42  --   --   ALKPHOS 183* 164* 166*  --   --   BILITOT 1.7* 1.0 1.0  --   --   PROT 7.2 6.7 6.2*  --   --   ALBUMIN 2.6* 2.1* 1.6* 1.4* 1.4*   No results for input(s): LIPASE, AMYLASE in the last 168 hours. No results for input(s): AMMONIA in the last 168 hours. Coagulation Profile: Recent Labs  Lab 09/08/19 1607  INR 1.2   Cardiac Enzymes: Recent Labs  Lab 09/10/19 1054  CKTOTAL 732*    BNP (last 3 results) No results for input(s): PROBNP in the last 8760 hours. HbA1C: No results for input(s): HGBA1C in the last 72 hours. CBG: Recent Labs  Lab 09/13/19 1215 09/13/19 1634 09/13/19 2121 09/13/19 2239 09/14/19 0800  GLUCAP 133* 111* 82 96 85   Lipid Profile: No results for input(s): CHOL, HDL, LDLCALC, TRIG, CHOLHDL, LDLDIRECT in the last 72 hours. Thyroid Function Tests: No results for input(s): TSH, T4TOTAL, FREET4, T3FREE, THYROIDAB in the last 72 hours. Anemia Panel: No results for input(s): VITAMINB12, FOLATE, FERRITIN, TIBC, IRON, RETICCTPCT in the last 72 hours.    Radiology Studies: I have reviewed all of the imaging during this hospital visit personally     Scheduled Meds: . atorvastatin  80 mg Oral QPM  . Chlorhexidine Gluconate Cloth  6 each Topical Daily  . heparin injection (subcutaneous)  5,000 Units Subcutaneous Q8H  . insulin glargine  10 Units Subcutaneous QHS  . levothyroxine  125 mcg Oral Q0600  . multivitamin with minerals  1 tablet Oral Daily  . nystatin   Topical TID  . Ensure Max Protein  11 oz Oral BID  . sodium bicarbonate  650 mg Oral TID   Continuous Infusions: . ampicillin-sulbactam (UNASYN) IV 3 g (09/13/19 2242)  . linezolid (ZYVOX) IV Stopped (09/13/19 2216)  . sodium chloride       LOS: 6 days        Demita Tobia Gerome Apley, MD

## 2019-09-14 NOTE — Progress Notes (Signed)
Dr. Cathlean Sauer is notified that pt is too lethargic to take her PO medications.

## 2019-09-14 NOTE — Significant Event (Signed)
Rapid Response Event Note  Overview: Time Called: 2297 Arrival Time: 1652 Event Type: Neurologic  Initial Focused Assessment: Patient still somnolent this afternoon.  Opens eyes but little else. BP 123/54  HR 64  RR 19  O2 sat 99 Lung sounds clear.  Interventions: Narcan 0.4 given IV Patient more awake and easily interactive.  Complaining that her back itches.  She moves her extremities weakly   Dr Cathlean Sauer notified of patient status Order received for ABG 7.36/30/90/17  1900  Patient still awake and interactive.  RN to call if further assistance needed.  Plan of Care (if not transferred):  Event Summary: Name of Physician Notified: Arrien at 1715    at    Outcome: Stayed in room and stabalized  Event End Time: [place]  Raliegh Ip

## 2019-09-14 NOTE — Progress Notes (Signed)
Patient ID: Maureen Jackson, female   DOB: 03/15/1956, 63 y.o.   MRN: [id number] [facility] Progress Note   Assessment/ Plan:   1. Acute kidney Injury: Likely ATN from sepsis/vancomycin nephrotoxicity with differentials including AIN.  Low index of suspicion that this is an acute GN based on initial urinalysis/urine microscopy findings.  Labs indicate that she is likely at the plateau phase of ATN and with mild volume excess noted on chest x-ray/exam.  Will attempt to challenge her with furosemide again today.  No acute dialysis needs noted-I suspect her encephalopathy is primarily related to narcotic analgesics plus/minus hypercapnia from OSA/OHS. 2.  Hyponatremia: Secondary to acute kidney injury and impaired free water excretion.  Oral intake limited by mental status, will continue to follow daily labs and attempt diuretic challenge. 3.  Anion gap metabolic acidosis: Secondary to acute kidney injury/SIRS from infection, continue sodium bicarbonate. 4.  Left labial abscess versus Fournier's gangrene: Earlier CT scan suggestive of Fournier's gangrene prompting transfer to [facility] for possible surgical intervention.  On examination found to be having local labial abscess with drainage-ongoing intravenous antibiotics with persistent leukocytosis with wound care/antibiotic treatment for which debridement/washout planned for tomorrow/Tuesday. 5.  Anemia: Secondary to acute/critical illness, no overt loss.  Continue to monitor trend. 6.  Hyperphosphatemia: Secondary to acute kidney injury and started on calcium acetate by primary service.  We will continue to follow (oral intake inconsistent).  Subjective:   Somnolent/lethargic overnight and seen by rapid response team-recommendations for CPAP noted.   Objective:   BP (!) 104/59 (BP Location: Left Wrist)   Pulse 64   Temp 97.9 F (36.6 C) (Oral)   Resp 19   Ht 5\' 5"  (1.651 m)   Wt (!) 152 kg   SpO2 99%   BMI 55.75 kg/m    Intake/Output Summary (Last 24 hours) at 09/14/2019 1020 Last data filed at 09/14/2019 0804 Gross per 24 hour  Intake 1600 ml  Output 375 ml  Net 1225 ml   Weight change:   Physical Exam: Gen: Morbidly obese woman, somnolent, barely arousable with conversation CVS: Pulse regular rhythm, normal rate, S1 and S2 normal Resp: Distant breath sounds, no distinct rales or rhonchi Abd: Soft, obese, nontender, bowel sounds normal Ext: With lymphedema and trace pitting edema  Imaging: Dg Chest Port 1 View  Result Date: 09/13/2019 CLINICAL DATA:  Dyspnea EXAM: PORTABLE CHEST 1 VIEW COMPARISON:  09/12/2019 FINDINGS: Bilateral interstitial and patchy alveolar airspace opacities particularly of the at the lung bases. No pleural effusion or pneumothorax. Stable cardiomegaly. No acute osseous abnormality. IMPRESSION: Patchy bilateral lower lobe airspace disease most concerning for multilobar pneumonia. Electronically Signed   By: Kathreen Devoid   On: 09/13/2019 11:08   Labs: BMET Recent Labs  Lab 09/08/19 1607 09/10/19 0559 09/11/19 0813 09/12/19 0531 09/13/19 0311 09/14/19 0815  NA 133* 135 132* 132* 132* 132*  K 3.6 4.0 4.4 4.3 4.2 4.9  CL 100 100 100 99 98 97*  CO2 23 22 17* 18* 17* 17*  GLUCOSE 268* 170* 104* 75 118* 83  BUN 30* 53* 64* 77* 88* 96*  CREATININE 1.88* 4.13* 5.91* 6.90* 7.42* 7.53*  CALCIUM 7.3* 7.0* 6.7* 6.6* 6.5* 5.9*  PHOS  --   --   --  8.1* 8.2* 9.3*   CBC Recent Labs  Lab 09/08/19 1607  09/11/19 0813 09/12/19 0531 09/13/19 0311 09/14/19 0815  WBC 21.0*   < > 18.1* 20.9* 20.2* 22.7*  NEUTROABS 16.8*  --  12.9*  --   --  18.1*  HGB 9.7*   < > 8.8* 8.6* 9.3* 8.1*  HCT 31.0*   < > 27.4* 26.3* 27.6* 25.1*  MCV 87.8   < > 86.7 85.7 84.1 85.4  PLT 222   < > 244 245 269 343   < > = values in this interval not displayed.    Medications:    . atorvastatin  80 mg Oral QPM  . calcium acetate  1,334 mg Oral TID WC  . Chlorhexidine Gluconate Cloth  6 each  Topical Daily  . furosemide  80 mg Intravenous Q6H  . heparin injection (subcutaneous)  5,000 Units Subcutaneous Q8H  . insulin glargine  10 Units Subcutaneous QHS  . levothyroxine  125 mcg Oral Q0600  . multivitamin with minerals  1 tablet Oral Daily  . nystatin   Topical TID  . Ensure Max Protein  11 oz Oral BID  . sodium bicarbonate  650 mg Oral TID   Elmarie Shiley, MD 09/14/2019, 10:20 AM

## 2019-09-15 ENCOUNTER — Inpatient Hospital Stay (HOSPITAL_COMMUNITY): Payer: Medicare HMO

## 2019-09-15 DIAGNOSIS — R400 Somnolence: Secondary | ICD-10-CM

## 2019-09-15 DIAGNOSIS — Z960 Presence of urogenital implants: Secondary | ICD-10-CM

## 2019-09-15 DIAGNOSIS — L0291 Cutaneous abscess, unspecified: Secondary | ICD-10-CM

## 2019-09-15 DIAGNOSIS — N7689 Other specified inflammation of vagina and vulva: Secondary | ICD-10-CM

## 2019-09-15 DIAGNOSIS — L988 Other specified disorders of the skin and subcutaneous tissue: Secondary | ICD-10-CM

## 2019-09-15 LAB — CBC WITH DIFFERENTIAL/PLATELET
Abs Immature Granulocytes: 1.04 10*3/uL — ABNORMAL HIGH (ref 0.00–0.07)
Basophils Absolute: 0.1 10*3/uL (ref 0.0–0.1)
Basophils Relative: 0 %
Eosinophils Absolute: 0.4 10*3/uL (ref 0.0–0.5)
Eosinophils Relative: 2 %
HCT: 25.8 % — ABNORMAL LOW (ref 36.0–46.0)
Hemoglobin: 8.4 g/dL — ABNORMAL LOW (ref 12.0–15.0)
Immature Granulocytes: 5 %
Lymphocytes Relative: 10 %
Lymphs Abs: 2.1 10*3/uL (ref 0.7–4.0)
MCH: 27.7 pg (ref 26.0–34.0)
MCHC: 32.6 g/dL (ref 30.0–36.0)
MCV: 85.1 fL (ref 80.0–100.0)
Monocytes Absolute: 0.7 10*3/uL (ref 0.1–1.0)
Monocytes Relative: 3 %
Neutro Abs: 16.7 10*3/uL — ABNORMAL HIGH (ref 1.7–7.7)
Neutrophils Relative %: 80 %
Platelets: 283 10*3/uL (ref 150–400)
RBC: 3.03 MIL/uL — ABNORMAL LOW (ref 3.87–5.11)
RDW: 14.9 % (ref 11.5–15.5)
WBC: 21 10*3/uL — ABNORMAL HIGH (ref 4.0–10.5)
nRBC: 0 % (ref 0.0–0.2)

## 2019-09-15 LAB — BLOOD GAS, ARTERIAL
Acid-base deficit: 7.6 mmol/L — ABNORMAL HIGH (ref 0.0–2.0)
Bicarbonate: 17 mmol/L — ABNORMAL LOW (ref 20.0–28.0)
FIO2: 0.28
O2 Saturation: 96 %
Patient temperature: 98.6
pCO2 arterial: 32.1 mmHg (ref 32.0–48.0)
pH, Arterial: 7.344 — ABNORMAL LOW (ref 7.350–7.450)
pO2, Arterial: 89.9 mmHg (ref 83.0–108.0)

## 2019-09-15 LAB — IRON AND TIBC
Iron: 37 ug/dL (ref 28–170)
Saturation Ratios: 21 % (ref 10.4–31.8)
TIBC: 178 ug/dL — ABNORMAL LOW (ref 250–450)
UIBC: 141 ug/dL

## 2019-09-15 LAB — URINALYSIS, ROUTINE W REFLEX MICROSCOPIC
Bilirubin Urine: NEGATIVE
Glucose, UA: NEGATIVE mg/dL
Ketones, ur: NEGATIVE mg/dL
Nitrite: NEGATIVE
Protein, ur: 30 mg/dL — AB
Specific Gravity, Urine: 1.012 (ref 1.005–1.030)
WBC, UA: >50 WBC/hpf — ABNORMAL HIGH (ref 0–5)
pH: 5 (ref 5.0–8.0)

## 2019-09-15 LAB — RENAL FUNCTION PANEL
Albumin: 1.5 g/dL — ABNORMAL LOW (ref 3.5–5.0)
Anion gap: 17 — ABNORMAL HIGH (ref 5–15)
BUN: 102 mg/dL — ABNORMAL HIGH (ref 8–23)
CO2: 17 mmol/L — ABNORMAL LOW (ref 22–32)
Calcium: 5.9 mg/dL — CL (ref 8.9–10.3)
Chloride: 98 mmol/L (ref 98–111)
Creatinine, Ser: 7.3 mg/dL — ABNORMAL HIGH (ref 0.44–1.00)
GFR calc Af Amer: 6 mL/min — ABNORMAL LOW (ref >60)
GFR calc non Af Amer: 5 mL/min — ABNORMAL LOW (ref >60)
Glucose, Bld: 87 mg/dL (ref 70–99)
Phosphorus: 9.1 mg/dL — ABNORMAL HIGH (ref 2.5–4.6)
Potassium: 4.6 mmol/L (ref 3.5–5.1)
Sodium: 132 mmol/L — ABNORMAL LOW (ref 135–145)

## 2019-09-15 LAB — GLUCOSE, CAPILLARY
Glucose-Capillary: 78 mg/dL (ref 70–99)
Glucose-Capillary: 118 mg/dL — ABNORMAL HIGH (ref 70–99)
Glucose-Capillary: 78 mg/dL (ref 70–99)

## 2019-09-15 LAB — FERRITIN: Ferritin: 390 ng/mL — ABNORMAL HIGH (ref 11–307)

## 2019-09-15 LAB — LACTIC ACID, PLASMA: Lactic Acid, Venous: 0.9 mmol/L (ref 0.5–1.9)

## 2019-09-15 MED ORDER — CHLORHEXIDINE GLUCONATE CLOTH 2 % EX PADS
6.0000 | MEDICATED_PAD | Freq: Every day | CUTANEOUS | Status: DC
  Administered 2019-09-16 – 2019-09-19 (×4): 6 via TOPICAL

## 2019-09-15 MED ORDER — CALCIUM GLUCONATE-NACL 1-0.675 GM/50ML-% IV SOLN
1.0000 g | Freq: Once | INTRAVENOUS | Status: AC
  Administered 2019-09-15: 1000 mg via INTRAVENOUS
  Filled 2019-09-15: qty 50

## 2019-09-15 MED ORDER — MORPHINE SULFATE (PF) 2 MG/ML IV SOLN: 0.5000 mg | INTRAVENOUS | Status: DC | PRN

## 2019-09-15 MED ORDER — DARBEPOETIN ALFA 60 MCG/0.3ML IJ SOSY
60.0000 ug | PREFILLED_SYRINGE | Freq: Once | INTRAMUSCULAR | Status: AC
  Administered 2019-09-15: 60 ug via SUBCUTANEOUS
  Filled 2019-09-15: qty 0.3

## 2019-09-15 NOTE — Plan of Care (Signed)
  Problem: Education: Goal: Knowledge of General Education information will improve Description Including pain rating scale, medication(s)/side effects and non-pharmacologic comfort measures Outcome: Progressing   

## 2019-09-15 NOTE — Progress Notes (Signed)
Subjective: Patient somnolent again   Antibiotics:  Anti-infectives (From admission, onward)   Start     Dose/Rate Route Frequency Ordered Stop   09/12/19 1100  linezolid (ZYVOX) IVPB 600 mg     600 mg 300 mL/hr over 60 Minutes Intravenous Every 12 hours 09/12/19 0945     09/12/19 1100  Ampicillin-Sulbactam (UNASYN) 3 g in sodium chloride 0.9 % 100 mL IVPB     3 g 200 mL/hr over 30 Minutes Intravenous Every 12 hours 09/12/19 0945     09/10/19 1430  clindamycin (CLEOCIN) IVPB 600 mg  Status:  Discontinued     600 mg 100 mL/hr over 30 Minutes Intravenous Every 6 hours 09/10/19 1410 09/12/19 0945   09/10/19 0742  vancomycin variable dose per unstable renal function (pharmacist dosing)  Status:  Discontinued      Does not apply See admin instructions 09/10/19 0742 09/10/19 1413   09/10/19 0000  vancomycin (VANCOCIN) 1,750 mg in sodium chloride 0.9 % 500 mL IVPB  Status:  Discontinued     1,750 mg 250 mL/hr over 120 Minutes Intravenous Every 24 hours 09/09/19 0912 09/09/19 0928   09/10/19 0000  vancomycin (VANCOCIN) 1,750 mg in sodium chloride 0.9 % 500 mL IVPB  Status:  Discontinued     1,750 mg 250 mL/hr over 120 Minutes Intravenous Every 24 hours 09/09/19 0940 09/10/19 0751   09/09/19 1030  vancomycin (VANCOCIN) IVPB 750 mg/150 ml premix  Status:  Discontinued     750 mg 150 mL/hr over 60 Minutes Intravenous  Once 09/09/19 0928 09/09/19 0938   09/09/19 0930  vancomycin (VANCOCIN) IVPB 1000 mg/200 mL premix  Status:  Discontinued     1,000 mg 200 mL/hr over 60 Minutes Intravenous  Once 09/09/19 0928 09/09/19 0938   09/08/19 2300  azithromycin (ZITHROMAX) 500 mg in sodium chloride 0.9 % 250 mL IVPB  Status:  Discontinued     500 mg 250 mL/hr over 60 Minutes Intravenous Every 24 hours 09/08/19 2230 09/12/19 0945   09/08/19 2300  cefTRIAXone (ROCEPHIN) 2 g in sodium chloride 0.9 % 100 mL IVPB  Status:  Discontinued     2 g 200 mL/hr over 30 Minutes Intravenous Every 24  hours 09/08/19 2230 09/12/19 0945   09/08/19 2300  vancomycin (VANCOCIN) 1,500 mg in sodium chloride 0.9 % 500 mL IVPB     1,500 mg 250 mL/hr over 120 Minutes Intravenous  Once 09/08/19 2254 09/09/19 0434   09/08/19 1600  vancomycin (VANCOCIN) IVPB 1000 mg/200 mL premix     1,000 mg 200 mL/hr over 60 Minutes Intravenous  Once 09/08/19 1558 09/08/19 1929   09/08/19 1600  piperacillin-tazobactam (ZOSYN) IVPB 3.375 g     3.375 g 100 mL/hr over 30 Minutes Intravenous  Once 09/08/19 1558 09/08/19 1655      Medications: Scheduled Meds: . atorvastatin  80 mg Oral QPM  . calcium acetate  1,334 mg Oral TID WC  . Chlorhexidine Gluconate Cloth  6 each Topical Daily  . heparin injection (subcutaneous)  5,000 Units Subcutaneous Q8H  . levothyroxine  125 mcg Oral Q0600  . multivitamin with minerals  1 tablet Oral Daily  . nystatin   Topical TID  . Ensure Max Protein  11 oz Oral BID  . sodium bicarbonate  650 mg Oral TID   Continuous Infusions: . ampicillin-sulbactam (UNASYN) IV 3 g (09/15/19 1255)  . linezolid (ZYVOX) IV 600 mg (09/15/19 1024)  . sodium chloride  PRN Meds:.acetaminophen, ipratropium-albuterol, naloxone    Objective: Weight change:   Intake/Output Summary (Last 24 hours) at 09/15/2019 1317 Last data filed at 09/15/2019 1039 Gross per 24 hour  Intake 0 ml  Output 1650 ml  Net -1650 ml   Blood pressure (!) 125/49, pulse 66, temperature 98.2 F (36.8 C), temperature source Oral, resp. rate 20, height 5\' 5"  (1.651 m), weight (!) 152 kg, SpO2 99 %. Temp:  [98.1 F (36.7 C)-98.8 F (37.1 C)] 98.2 F (36.8 C) (10/05 1118) Pulse Rate:  [64-67] 66 (10/05 1118) Resp:  [14-20] 20 (10/05 1118) BP: (116-125)/(49-60) 125/49 (10/05 1118) SpO2:  [99 %-100 %] 99 % (10/05 1118)  Physical Exam: General somnolent and to arouse CVS regular rate, normal  Chest: , no wheezing, no respiratory distress Abdomen: soft non-distended,  She now has purulent material coming out of  Foley catheter near her abscess Neuro: nonfocal  CBC:    BMET Recent Labs    09/14/19 0815 09/15/19 0511  NA 132* 132*  K 4.9 4.6  CL 97* 98  CO2 17* 17*  GLUCOSE 83 87  BUN 96* 102*  CREATININE 7.53* 7.30*  CALCIUM 5.9* 5.9*     Liver Panel  Recent Labs    09/14/19 0815 09/15/19 0511  ALBUMIN 1.3* 1.5*       Sedimentation Rate No results for input(s): ESRSEDRATE in the last 72 hours. C-Reactive Protein No results for input(s): CRP in the last 72 hours.  Micro Results: Recent Results (from the past 720 hour(s))  Urine culture     Status: Abnormal   Collection Time: 09/08/19  3:58 PM   Specimen: In/Out Cath Urine  Result Value Ref Range Status   Specimen Description   Final    IN/OUT CATH URINE Performed at [facility], [address], [place], [place] [address]    Special Requests   Final    NONE Performed at [facility], [address], [place], [place] [address]    Culture >=100,000 COLONIES/mL YEAST (A)  Final   Report Status 09/10/2019 FINAL  Final  SARS Coronavirus 2 [facility] order, Performed in [facility] hospital lab) Nasopharyngeal Nasopharyngeal Swab     Status: None   Collection Time: 09/08/19  3:59 PM   Specimen: Nasopharyngeal Swab  Result Value Ref Range Status   SARS Coronavirus 2 NEGATIVE NEGATIVE Final    Comment: (NOTE) If result is NEGATIVE SARS-CoV-2 target nucleic acids are NOT DETECTED. The SARS-CoV-2 RNA is generally detectable in upper and lower  respiratory specimens during the acute phase of infection. The lowest  concentration of SARS-CoV-2 viral copies this assay can detect is 250  copies / mL. A negative result does not preclude SARS-CoV-2 infection  and should not be used as the sole basis for treatment or other  patient management decisions.  A negative result may occur with  improper specimen collection / handling, submission of specimen other  than nasopharyngeal swab, presence of viral mutation(s) within the   areas targeted by this assay, and inadequate number of viral copies  (<250 copies / mL). A negative result must be combined with clinical  observations, patient history, and epidemiological information. If result is POSITIVE SARS-CoV-2 target nucleic acids are DETECTED. The SARS-CoV-2 RNA is generally detectable in upper and lower  respiratory specimens dur ing the acute phase of infection.  Positive  results are indicative of active infection with SARS-CoV-2.  Clinical  correlation with patient history and other diagnostic information is  necessary to determine  patient infection status.  Positive results do  not rule out bacterial infection or co-infection with other viruses. If result is PRESUMPTIVE POSTIVE SARS-CoV-2 nucleic acids MAY BE PRESENT.   A presumptive positive result was obtained on the submitted specimen  and confirmed on repeat testing.  While 2019 novel coronavirus  (SARS-CoV-2) nucleic acids may be present in the submitted sample  additional confirmatory testing may be necessary for epidemiological  and / or clinical management purposes  to differentiate between  SARS-CoV-2 and other Sarbecovirus currently known to infect humans.  If clinically indicated additional testing with an alternate test  methodology [phone]) is advised. The SARS-CoV-2 RNA is generally  detectable in upper and lower respiratory sp ecimens during the acute  phase of infection. The expected result is Negative. Fact Sheet for Patients:  StrictlyIdeas.no Fact Sheet for Healthcare Providers: BankingDealers.co.za This test is not yet approved or cleared by the [place] FDA and has been authorized for detection and/or diagnosis of SARS-CoV-2 by FDA under an Emergency Use Authorization (EUA).  This EUA will remain in effect (meaning this test can be used) for the duration of the COVID-19 declaration under Section 564(b)(1) of the Act, 21 U.S.C.  section 360bbb-3(b)(1), unless the authorization is terminated or revoked sooner. Performed at [facility], [address] [place], [place] [address]   Blood Culture (routine x 2)     Status: None   Collection Time: 09/08/19  4:07 PM   Specimen: BLOOD RIGHT WRIST  Result Value Ref Range Status   Specimen Description BLOOD RIGHT WRIST  Final   Special Requests   Final    BOTTLES DRAWN AEROBIC AND ANAEROBIC Blood Culture adequate volume   Culture   Final    NO GROWTH 5 DAYS Performed at [facility], [address], [place], [place] [address]    Report Status 09/13/2019 FINAL  Final  Blood Culture (routine x 2)     Status: None   Collection Time: 09/08/19  4:17 PM   Specimen: BLOOD LEFT HAND  Result Value Ref Range Status   Specimen Description BLOOD LEFT HAND  Final   Special Requests   Final    BOTTLES DRAWN AEROBIC AND ANAEROBIC Blood Culture adequate volume   Culture   Final    NO GROWTH 5 DAYS Performed at [facility], [address], [place], [place] [address]    Report Status 09/13/2019 FINAL  Final    Studies/Results: No results found.    Assessment/Plan:  INTERVAL HISTORY: Surgery was delayed due to Plavix she now has purulent urine coming out of Foley catheter make me concern for possible fistula   Principal Problem:   Fournier's gangrene in female Active Problems:   Hypothyroidism   Essential hypertension   Morbid obesity/BMI > 55   Diabetes mellitus type 2 with complications, uncontrolled (HCC)   CAD, multiple vessel   Ischemic cardiomyopathy   Chronic combined systolic and diastolic CHF (congestive heart failure) (EF 30 to 35 %)   CAP (community acquired pneumonia)   Left genital labial abscess    ALLEN BASISTA is a 63 y.o. female with Dubiel abscess with Fournier's and sepsis and endorgan failure now with purulent material coming from her catheter make me concerned that she may have a fistula between her abscess and bladder.  She needs surgery  as soon as possible  Not know if she may need also urological assistance.  PLEASE OBTAIN CULTURES IN OR In sterile container  Would continue current antimicrobials  LOS: 7 days   Alcide Evener 09/15/2019, 1:17 PM

## 2019-09-15 NOTE — Progress Notes (Signed)
Patient ID: Maureen Jackson, female   DOB: Nov 11, 1956, 63 y.o.   MRN: [id number] [facility] Progress Note   Assessment/ Plan:   1. Acute kidney Injury: Likely ATN from sepsis/vancomycin nephrotoxicity with differentials including AIN.  Low index of suspicion that this is an acute GN based on initial urinalysis/urine microscopy findings.  Labs indicate that she is likely at the plateau phase of ATN and with mild volume excess noted on chest x-ray/exam.  - Recommend initiation of hemodialysis - Consult IR for tunneled catheter placement on 10/6 and initiation of HD afterward on 10/6. - NPO after midnight tonight  2. Encephalopathy -multifactorial with AKI, OSA, narcotics  3.  Hyponatremia: mild. Secondary to acute kidney injury and impaired free water excretion.  Oral intake limited by mental status  4.  Anion gap metabolic acidosis: Secondary to acute kidney injury/SIRS from infection, continue sodium bicarbonate.  5.  Left labial abscess versus Fournier's gangrene: Earlier CT scan suggestive of Fournier's gangrene prompting transfer to [facility] for possible surgical intervention.  On examination found to be having local labial abscess with drainage-ongoing intravenous antibiotics with persistent leukocytosis with wound care/antibiotic treatment for which and planned for debridement/washout   6.  Anemia: Secondary to acute/critical illness, no overt loss.  Continue to monitor trend. Check iron stores.  aranesp on 10/5.  7.  Hyperphosphatemia: Secondary to acute kidney injury and started on calcium acetate by primary service.  We will continue to follow (oral intake inconsistent).  Subjective:   Patient had 1.4 liters UOP Over 10/4 charted with diuretic.  Worsening azotemia.  Spoke with her daughter via phone and she would want her to have dialysis     Review of systems:  Limited secondary to AMS however denies shortness of breath   Objective:   BP (!) 125/49 (BP Location:  Left Arm)   Pulse 66   Temp 98.2 F (36.8 C) (Oral)   Resp 20   Ht 5\' 5"  (1.651 m)   Wt (!) 152 kg   SpO2 99%   BMI 55.75 kg/m   Intake/Output Summary (Last 24 hours) at 09/15/2019 1551 Last data filed at 09/15/2019 1039 Gross per 24 hour  Intake 0 ml  Output 1450 ml  Net -1450 ml   Weight change:   Physical Exam:  Gen: Morbidly obese female. Arouses to voice with exam but sleepy CVS: RRR no rub  Resp: Distant breath sounds, no distinct rales or rhonchi; unlabored at rest  Abd: Soft, obese, nontender, bowel sounds normal Ext: With lymphedema and trace edema Neuro - states year is 2014; oriented to person GU has a foley   Imaging: Dg Chest Port 1 View  Result Date: 09/15/2019 CLINICAL DATA:  Reason for exam: AMS Nurse tech reports patient has bilateral pneumonia. Hx of LBBB, htn, diabetes, cad, chf. Hx of coronary stent. Quit smoking 2007. EXAM: PORTABLE CHEST 1 VIEW COMPARISON:  Chest radiograph 09/13/2019 FINDINGS: Stable cardiomediastinal contours with enlarged heart size. There are persistent bilateral airspace opacities predominantly in the lower lungs. No pneumothorax or large pleural effusion. No acute finding in the visualized skeleton. IMPRESSION: Persistent predominantly lower lobe airspace opacities bilaterally most likely representing infection. Electronically Signed   By: Audie Pinto M.D.   On: 09/15/2019 13:50   Labs: BMET Recent Labs  Lab 09/08/19 1607 09/10/19 0559 09/11/19 0813 09/12/19 0531 09/13/19 0311 09/14/19 0815 09/15/19 0511  NA 133* 135 132* 132* 132* 132* 132*  K 3.6 4.0 4.4 4.3 4.2 4.9 4.6  CL 100 100 100 99 98 97* 98  CO2 23 22 17* 18* 17* 17* 17*  GLUCOSE 268* 170* 104* 75 118* 83 87  BUN 30* 53* 64* 77* 88* 96* 102*  CREATININE 1.88* 4.13* 5.91* 6.90* 7.42* 7.53* 7.30*  CALCIUM 7.3* 7.0* 6.7* 6.6* 6.5* 5.9* 5.9*  PHOS  --   --   --  8.1* 8.2* 9.3* 9.1*   CBC Recent Labs  Lab 09/08/19 1607  09/11/19 0813 09/12/19 0531  09/13/19 0311 09/14/19 0815 09/15/19 0511  WBC 21.0*   < > 18.1* 20.9* 20.2* 22.7* 21.0*  NEUTROABS 16.8*  --  12.9*  --   --  18.1* 16.7*  HGB 9.7*   < > 8.8* 8.6* 9.3* 8.1* 8.4*  HCT 31.0*   < > 27.4* 26.3* 27.6* 25.1* 25.8*  MCV 87.8   < > 86.7 85.7 84.1 85.4 85.1  PLT 222   < > 244 245 269 343 283   < > = values in this interval not displayed.    Medications:    . atorvastatin  80 mg Oral QPM  . calcium acetate  1,334 mg Oral TID WC  . Chlorhexidine Gluconate Cloth  6 each Topical Daily  . heparin injection (subcutaneous)  5,000 Units Subcutaneous Q8H  . levothyroxine  125 mcg Oral Q0600  . multivitamin with minerals  1 tablet Oral Daily  . nystatin   Topical TID  . Ensure Max Protein  11 oz Oral BID  . sodium bicarbonate  650 mg Oral TID   Claudia Desanctis 09/15/2019, 3:51 PM

## 2019-09-15 NOTE — TOC Progression Note (Addendum)
Transition of Care [facility]) - Progression Note    Patient Details  Name: Maureen Jackson MRN: [id number] Date of Birth: November 11, 1956  [facility]) CM/SW Contact  Zenon Mayo, RN Phone Number: 09/15/2019, 6:52 PM  Clinical Narrative:    left labial abcess, CAP, Lives at home with her daughter, Ambulates some with a cane, nephro consulted. Transfer from [place] on 9/30. Transferred to 3E Progressive on 10/5 from 6N- AKI, for tunneled HD cath on 10/6 and initiation of dialysis, lethargic.          Expected Discharge Plan and Services                                                 Social Determinants of Health (SDOH) Interventions    Readmission Risk Interventions No flowsheet data found.

## 2019-09-15 NOTE — Significant Event (Addendum)
Rapid Response Event Note  Overview: Follow Up  Initial Focused Assessment: I went up see Maureen Jackson as a follow-up, TRH MD was on 6N. We briefly discussed the patient. I went to see Maureen Jackson, she woke to my voice but very quickly fell asleep. Per nurse, VSS - patient has had ongoing decreased LOC/AMS, has remained hemodynamically stable overall with review of VS. Breathing comfortably. Ongoing AKI. Pending ongoing evaluation for washout per CCS.   Interventions: -- No RRT Interventions  Plan of Care: -- Repeat LA -- Repeat ABG -- Transfer to PCU - I called bed placement, will facilitate PCU bed as soon as it can be arranged.   Event Summary:  Start Time 0855 End Time 0915   Maureen Jackson R

## 2019-09-15 NOTE — Progress Notes (Signed)
[facility]/Trauma Progress Note      Assessment/Plan MO DM CAD with cardiac stents in place on plavix Hypothyroidism HLD HF with EF 35-40% ARF   Left labial abscess - CT scan from 9/30 shows extensive inflammatory/infectious process involving the mons pubis, labia, perineum and suprapubic regions with extensive gas formation worrisome for Fournier's gangrene; no discrete drainable soft tissue abscess - Wound is still open and draining. WBC is stable. Continue broad spectrum antibiotics per ID and wound care for now  - OR tomorrow 10/06  ID Rocephin 9/28-10/2, azithromycin 9/28-10/2, clindamycin 9/30-10/2. Currently on Zyvox and Unasyn 10/2 >> VTE -SCDs, sq heparin, last does of plavix 10/02  FEN -IVF,CM, NPO at midnight Foley -in place Follow up -TBD.   LOS: 7 days    Subjective: CC: somnolent   Nurse states rapid response called overnight. Pt was given narcan and improved. Today she will wake and open her eyes. She states she is ok today. Not complaining of pain.  Objective: Vital signs in last 24 hours: Temp:  [97.9 F (36.6 C)-98.8 F (37.1 C)] 98.8 F (37.1 C) (10/05 0545) Pulse Rate:  [64-67] 65 (10/05 0545) Resp:  [14-19] 16 (10/05 0545) BP: (104-123)/(54-60) 118/56 (10/05 0545) SpO2:  [99 %-100 %] 100 % (10/05 0545) Last BM Date: 09/03/19  Intake/Output from previous day: 10/04 0701 - 10/05 0700 In: 0  Out: 1350 [Urine:1350] Intake/Output this shift: No intake/output data recorded.  PE: UJW:[id number] but arousable and answers questions, NAD Lungs; Normal rate and effort  Skin: warm and dry GU: chaperone present, nurse, left labial abscess with 1cm opening and active purulent drainage, skin is soft around the opening, induration into mons pubis with 2x2cm area of purplish discoloration and scant purulent dischage, no crepitus, TTP   Anti-infectives: Anti-infectives (From admission, onward)   Start     Dose/Rate Route Frequency  Ordered Stop   09/12/19 1100  linezolid (ZYVOX) IVPB 600 mg     600 mg 300 mL/hr over 60 Minutes Intravenous Every 12 hours 09/12/19 0945     09/12/19 1100  Ampicillin-Sulbactam (UNASYN) 3 g in sodium chloride 0.9 % 100 mL IVPB     3 g 200 mL/hr over 30 Minutes Intravenous Every 12 hours 09/12/19 0945     09/10/19 1430  clindamycin (CLEOCIN) IVPB 600 mg  Status:  Discontinued     600 mg 100 mL/hr over 30 Minutes Intravenous Every 6 hours 09/10/19 1410 09/12/19 0945   09/10/19 0742  vancomycin variable dose per unstable renal function (pharmacist dosing)  Status:  Discontinued      Does not apply See admin instructions 09/10/19 0742 09/10/19 1413   09/10/19 0000  vancomycin (VANCOCIN) 1,750 mg in sodium chloride 0.9 % 500 mL IVPB  Status:  Discontinued     1,750 mg 250 mL/hr over 120 Minutes Intravenous Every 24 hours 09/09/19 0912 09/09/19 0928   09/10/19 0000  vancomycin (VANCOCIN) 1,750 mg in sodium chloride 0.9 % 500 mL IVPB  Status:  Discontinued     1,750 mg 250 mL/hr over 120 Minutes Intravenous Every 24 hours 09/09/19 0940 09/10/19 0751   09/09/19 1030  vancomycin (VANCOCIN) IVPB 750 mg/150 ml premix  Status:  Discontinued     750 mg 150 mL/hr over 60 Minutes Intravenous  Once 09/09/19 0928 09/09/19 0938   09/09/19 0930  vancomycin (VANCOCIN) IVPB 1000 mg/200 mL premix  Status:  Discontinued     1,000 mg 200 mL/hr over 60 Minutes Intravenous  Once 09/09/19 8295  09/09/19 0938   09/08/19 2300  azithromycin (ZITHROMAX) 500 mg in sodium chloride 0.9 % 250 mL IVPB  Status:  Discontinued     500 mg 250 mL/hr over 60 Minutes Intravenous Every 24 hours 09/08/19 2230 09/12/19 0945   09/08/19 2300  cefTRIAXone (ROCEPHIN) 2 g in sodium chloride 0.9 % 100 mL IVPB  Status:  Discontinued     2 g 200 mL/hr over 30 Minutes Intravenous Every 24 hours 09/08/19 2230 09/12/19 0945   09/08/19 2300  vancomycin (VANCOCIN) 1,500 mg in sodium chloride 0.9 % 500 mL IVPB     1,500 mg 250 mL/hr over 120  Minutes Intravenous  Once 09/08/19 2254 09/09/19 0434   09/08/19 1600  vancomycin (VANCOCIN) IVPB 1000 mg/200 mL premix     1,000 mg 200 mL/hr over 60 Minutes Intravenous  Once 09/08/19 1558 09/08/19 1929   09/08/19 1600  piperacillin-tazobactam (ZOSYN) IVPB 3.375 g     3.375 g 100 mL/hr over 30 Minutes Intravenous  Once 09/08/19 1558 09/08/19 1655      Lab Results:  Recent Labs    09/14/19 0815 09/15/19 0511  WBC 22.7* 21.0*  HGB 8.1* 8.4*  HCT 25.1* 25.8*  PLT 343 283   BMET Recent Labs    09/14/19 0815 09/15/19 0511  NA 132* 132*  K 4.9 4.6  CL 97* 98  CO2 17* 17*  GLUCOSE 83 87  BUN 96* 102*  CREATININE 7.53* 7.30*  CALCIUM 5.9* 5.9*   PT/INR No results for input(s): LABPROT, INR in the last 72 hours. CMP     Component Value Date/Time   NA 132 (L) 09/15/2019 0511   K 4.6 09/15/2019 0511   CL 98 09/15/2019 0511   CO2 17 (L) 09/15/2019 0511   GLUCOSE 87 09/15/2019 0511   BUN 102 (H) 09/15/2019 0511   CREATININE 7.30 (H) 09/15/2019 0511   CALCIUM 5.9 (LL) 09/15/2019 0511   PROT 6.2 (L) 09/11/2019 0813   ALBUMIN 1.5 (L) 09/15/2019 0511   AST 73 (H) 09/11/2019 0813   ALT 42 09/11/2019 0813   ALKPHOS 166 (H) 09/11/2019 0813   BILITOT 1.0 09/11/2019 0813   GFRNONAA 5 (L) 09/15/2019 0511   GFRAA 6 (L) 09/15/2019 0511   Lipase  No results found for: LIPASE  Studies/Results: Dg Chest Port 1 View  Result Date: 09/13/2019 CLINICAL DATA:  Dyspnea EXAM: PORTABLE CHEST 1 VIEW COMPARISON:  09/12/2019 FINDINGS: Bilateral interstitial and patchy alveolar airspace opacities particularly of the at the lung bases. No pleural effusion or pneumothorax. Stable cardiomegaly. No acute osseous abnormality. IMPRESSION: Patchy bilateral lower lobe airspace disease most concerning for multilobar pneumonia. Electronically Signed   By: Kathreen Devoid   On: 09/13/2019 11:08     Kalman Drape, [facility] Pager [phone] Myna Hidalgo, W, & Friday 7:00am -  4:30pm Thursdays 7:00am -11:30am

## 2019-09-15 NOTE — Progress Notes (Signed)
Patient started to become more alert after she transferred to our floor. She has been asking to reposition several times. No pressure ulcers to sacrum area. Red rashes to abdominal folds, groins and arms. Fournier's gangrene and Labial abscess present

## 2019-09-15 NOTE — Progress Notes (Signed)
Paged Md on call for  lab result Ca 5.9, waiting for reply.

## 2019-09-15 NOTE — Progress Notes (Signed)
PROGRESS NOTE    Maureen Jackson  DJS:[id number] DOB: Nov 22, 1956 DOA: 09/08/2019 PCP: Neale Burly, MD    Brief Narrative:  63 year old female who presented with cough and weakness.  She does have significant past medical history for morbid obesity, systolic heart failure, hypothyroidism, hypertension, coronary artery disease and obstructive sleep apnea.  Reported generalized weakness, difficulty voiding, poor appetite and insomnia for about 3 days.  September 26, 2 days prior to hospitalization she was seen in the emergency department and a left labia upper thigh abscess was drained. Patient had worsening renal function in the setting of concerns for Fournier's gangrene, patient was transferred from [facility] to [facility] for further management.     Assessment & Plan:   Principal Problem:   Fournier's gangrene in female Active Problems:   Hypothyroidism   Essential hypertension   Morbid obesity/BMI > 55   Diabetes mellitus type 2 with complications, uncontrolled (HCC)   CAD, multiple vessel   Ischemic cardiomyopathy   Chronic combined systolic and diastolic CHF (congestive heart failure) (EF 30 to 35 %)   CAP (community acquired pneumonia)   Left genital labial abscess     1. Mons/ labial abscess/ Fournier's gangrene/ complicated with sepsis/ acute metabolic encephalopathy.  -leukocytosis -- now with pus/sediment in foley-- ? Fistula-- to OR when able with general surgery - Patient has been somnolent, no recent use of analgesics -Continue with antibiotic linezolid and unasyn.  -ASA and clopidogrel have been held for surgical debridement. -due to clinical picture and 2 rapid response calls in 24 hours will change to progressive care  2. AKI with volume overload/ non gap metabolic acidosis. Suspected acute tubular necrosis/ oliguria/ hypocalcemia/ hyperphosphatemia.  -CR appears stable but BUN increasing -nephrology consult appreciated  3. Acute on chronic systolic heart failure  exacerbation. EF 30 to 35%.  -furosemide trial per nephrology (appears to have diuresed about 1.3L yesterday)  4. Ischemic cardiomyopathy.  -SP DES to LAD and RCA 2018. Holding antiplatelet therapy for now, for surgical procedure.  5. T2DM.  -check blood sugars but no coverage for now  6. Morbid obesity with OSA -Continue Cpap -Estimated body mass index is 55.75 kg/m as calculated from the following:   Height as of this encounter: 5\' 5"  (1.651 m).   Weight as of this encounter: 152 kg.   7. Hypothyroid. On levothyroxine.     DVT prophylaxis: enoxaparin   Code Status: full Family Communication: no family at the bedside  Disposition Plan/ discharge barriers: needs to go to OR   Malnutrition Type:  Nutrition Problem: Inadequate oral intake Etiology: acute illness(pneumonia; abscess in left labia/upper thigh area)   Malnutrition Characteristics:  Signs/Symptoms: meal completion < 25%   Nutrition Interventions:  Interventions: Ensure Enlive (each supplement provides 350kcal and 20 grams of protein), MVI    Consultants:   Nephrology   Surgery   ID  Antimicrobials:   linezolid  Ceftriaxone    Subjective: Continues to be lethargic   Objective: Vitals:   09/14/19 1632 09/14/19 2114 09/15/19 0545 09/15/19 1118  BP: (!) 123/54 116/60 (!) 118/56 (!) 125/49  Pulse: 64 67 65 66  Resp: 19 14 16 20   Temp: 98.1 F (36.7 C) 98.5 F (36.9 C) 98.8 F (37.1 C) 98.2 F (36.8 C)  TempSrc: Oral Oral Oral Oral  SpO2: 99% 100% 100% 99%  Weight:      Height:        Intake/Output Summary (Last 24 hours) at 09/15/2019 1216 Last data filed at 09/15/2019  1039 Gross per 24 hour  Intake 0 ml  Output 1650 ml  Net -1650 ml   Filed Weights   09/08/19 1517  Weight: (!) 152 kg    Examination:  Drowsy, will answer few questions rrr On O2, no increased work of breathing, diminished due to size Has foley in place with pus/sediment in tubing Has left labial  wound with drainage      Data Reviewed: I have personally reviewed following labs and imaging studies  CBC: Recent Labs  Lab 09/08/19 1607  09/11/19 0813 09/12/19 0531 09/13/19 0311 09/14/19 0815 09/15/19 0511  WBC 21.0*   < > 18.1* 20.9* 20.2* 22.7* 21.0*  NEUTROABS 16.8*  --  12.9*  --   --  18.1* 16.7*  HGB 9.7*   < > 8.8* 8.6* 9.3* 8.1* 8.4*  HCT 31.0*   < > 27.4* 26.3* 27.6* 25.1* 25.8*  MCV 87.8   < > 86.7 85.7 84.1 85.4 85.1  PLT 222   < > 244 245 269 343 283   < > = values in this interval not displayed.   Basic Metabolic Panel: Recent Labs  Lab 09/11/19 0813 09/12/19 0531 09/13/19 0311 09/14/19 0815 09/15/19 0511  NA 132* 132* 132* 132* 132*  K 4.4 4.3 4.2 4.9 4.6  CL 100 99 98 97* 98  CO2 17* 18* 17* 17* 17*  GLUCOSE 104* 75 118* 83 87  BUN 64* 77* 88* 96* 102*  CREATININE 5.91* 6.90* 7.42* 7.53* 7.30*  CALCIUM 6.7* 6.6* 6.5* 5.9* 5.9*  PHOS  --  8.1* 8.2* 9.3* 9.1*   GFR: Estimated Creatinine Clearance: 12.1 mL/min (A) (by C-G formula based on SCr of 7.3 mg/dL (H)). Liver Function Tests: Recent Labs  Lab 09/08/19 1607 09/10/19 0559 09/11/19 0813 09/12/19 0531 09/13/19 0311 09/14/19 0815 09/15/19 0511  AST 33 52* 73*  --   --   --   --   ALT 32 32 42  --   --   --   --   ALKPHOS 183* 164* 166*  --   --   --   --   BILITOT 1.7* 1.0 1.0  --   --   --   --   PROT 7.2 6.7 6.2*  --   --   --   --   ALBUMIN 2.6* 2.1* 1.6* 1.4* 1.4* 1.3* 1.5*   No results for input(s): LIPASE, AMYLASE in the last 168 hours. No results for input(s): AMMONIA in the last 168 hours. Coagulation Profile: Recent Labs  Lab 09/08/19 1607  INR 1.2   Cardiac Enzymes: Recent Labs  Lab 09/10/19 1054  CKTOTAL 732*   BNP (last 3 results) No results for input(s): PROBNP in the last 8760 hours. HbA1C: No results for input(s): HGBA1C in the last 72 hours. CBG: Recent Labs  Lab 09/14/19 1646 09/14/19 1718 09/14/19 2117 09/15/19 0906 09/15/19 1122  GLUCAP 68*  120* 93 78 118*   Lipid Profile: No results for input(s): CHOL, HDL, LDLCALC, TRIG, CHOLHDL, LDLDIRECT in the last 72 hours. Thyroid Function Tests: No results for input(s): TSH, T4TOTAL, FREET4, T3FREE, THYROIDAB in the last 72 hours. Anemia Panel: No results for input(s): VITAMINB12, FOLATE, FERRITIN, TIBC, IRON, RETICCTPCT in the last 72 hours.    Radiology Studies: I have reviewed all of the imaging during this hospital visit personally     Scheduled Meds: . atorvastatin  80 mg Oral QPM  . calcium acetate  1,334 mg Oral TID WC  . Chlorhexidine Gluconate  Cloth  6 each Topical Daily  . heparin injection (subcutaneous)  5,000 Units Subcutaneous Q8H  . levothyroxine  125 mcg Oral Q0600  . multivitamin with minerals  1 tablet Oral Daily  . nystatin   Topical TID  . Ensure Max Protein  11 oz Oral BID  . sodium bicarbonate  650 mg Oral TID   Continuous Infusions: . ampicillin-sulbactam (UNASYN) IV 3 g (09/14/19 2254)  . linezolid (ZYVOX) IV 600 mg (09/15/19 1024)  . sodium chloride       LOS: 7 days     Geradine Girt, DO

## 2019-09-16 ENCOUNTER — Encounter (HOSPITAL_COMMUNITY): Payer: Self-pay

## 2019-09-16 ENCOUNTER — Inpatient Hospital Stay (HOSPITAL_COMMUNITY): Payer: Medicare HMO | Admitting: Certified Registered"

## 2019-09-16 ENCOUNTER — Inpatient Hospital Stay (HOSPITAL_COMMUNITY): Payer: Medicare HMO

## 2019-09-16 ENCOUNTER — Encounter (HOSPITAL_COMMUNITY)
Admission: EM | Disposition: A | Discharge status: Skilled Nursing Facility | Payer: Self-pay | Source: Home / Self Care | Attending: Internal Medicine

## 2019-09-16 HISTORY — PX: IR US GUIDE VASC ACCESS RIGHT: IMG2390

## 2019-09-16 HISTORY — PX: IR FLUORO GUIDE CV LINE RIGHT: IMG2283

## 2019-09-16 HISTORY — PX: INCISION AND DRAINAGE PERIRECTAL ABSCESS: SHX1804

## 2019-09-16 LAB — RENAL FUNCTION PANEL
Albumin: 1.5 g/dL — ABNORMAL LOW (ref 3.5–5.0)
Anion gap: 21 — ABNORMAL HIGH (ref 5–15)
BUN: 104 mg/dL — ABNORMAL HIGH (ref 8–23)
CO2: 16 mmol/L — ABNORMAL LOW (ref 22–32)
Calcium: 6.2 mg/dL — CL (ref 8.9–10.3)
Chloride: 96 mmol/L — ABNORMAL LOW (ref 98–111)
Creatinine, Ser: 6.72 mg/dL — ABNORMAL HIGH (ref 0.44–1.00)
GFR calc Af Amer: 7 mL/min — ABNORMAL LOW (ref >60)
GFR calc non Af Amer: 6 mL/min — ABNORMAL LOW (ref >60)
Glucose, Bld: 94 mg/dL (ref 70–99)
Phosphorus: 8.3 mg/dL — ABNORMAL HIGH (ref 2.5–4.6)
Potassium: 4.3 mmol/L (ref 3.5–5.1)
Sodium: 133 mmol/L — ABNORMAL LOW (ref 135–145)

## 2019-09-16 LAB — CBC
HCT: 26.9 % — ABNORMAL LOW (ref 36.0–46.0)
Hemoglobin: 8.8 g/dL — ABNORMAL LOW (ref 12.0–15.0)
MCH: 27.7 pg (ref 26.0–34.0)
MCHC: 32.7 g/dL (ref 30.0–36.0)
MCV: 84.6 fL (ref 80.0–100.0)
Platelets: 281 10*3/uL (ref 150–400)
RBC: 3.18 MIL/uL — ABNORMAL LOW (ref 3.87–5.11)
RDW: 14.7 % (ref 11.5–15.5)
WBC: 22.9 10*3/uL — ABNORMAL HIGH (ref 4.0–10.5)
nRBC: 0 % (ref 0.0–0.2)

## 2019-09-16 LAB — GLUCOSE, CAPILLARY
Glucose-Capillary: 107 mg/dL — ABNORMAL HIGH (ref 70–99)
Glucose-Capillary: 84 mg/dL (ref 70–99)
Glucose-Capillary: 82 mg/dL (ref 70–99)
Glucose-Capillary: 85 mg/dL (ref 70–99)
Glucose-Capillary: 98 mg/dL (ref 70–99)
Glucose-Capillary: 93 mg/dL (ref 70–99)

## 2019-09-16 SURGERY — INCISION AND DRAINAGE, ABSCESS, PERIRECTAL
Anesthesia: General | Laterality: Left

## 2019-09-16 MED ORDER — LIDOCAINE HCL 1 % IJ SOLN
INTRAMUSCULAR | Status: DC | PRN
  Administered 2019-09-16: 5 mL

## 2019-09-16 MED ORDER — DEXAMETHASONE SODIUM PHOSPHATE 10 MG/ML IJ SOLN
INTRAMUSCULAR | Status: AC
  Filled 2019-09-16: qty 1

## 2019-09-16 MED ORDER — MEPERIDINE HCL 25 MG/ML IJ SOLN: 6.2500 mg | INTRAMUSCULAR | Status: DC | PRN

## 2019-09-16 MED ORDER — FENTANYL CITRATE (PF) 250 MCG/5ML IJ SOLN
INTRAMUSCULAR | Status: AC
  Filled 2019-09-16: qty 5

## 2019-09-16 MED ORDER — PROMETHAZINE HCL 25 MG/ML IJ SOLN: 6.2500 mg | INTRAMUSCULAR | Status: DC | PRN

## 2019-09-16 MED ORDER — LIDOCAINE 2% (20 MG/ML) 5 ML SYRINGE
INTRAMUSCULAR | Status: DC | PRN
  Administered 2019-09-16: 40 mg via INTRAVENOUS

## 2019-09-16 MED ORDER — LIDOCAINE HCL 1 % IJ SOLN
INTRAMUSCULAR | Status: AC
  Filled 2019-09-16: qty 20

## 2019-09-16 MED ORDER — ONDANSETRON HCL 4 MG/2ML IJ SOLN
INTRAMUSCULAR | Status: AC
  Filled 2019-09-16: qty 2

## 2019-09-16 MED ORDER — ONDANSETRON HCL 4 MG/2ML IJ SOLN
INTRAMUSCULAR | Status: DC | PRN
  Administered 2019-09-16: 4 mg via INTRAVENOUS

## 2019-09-16 MED ORDER — LACTATED RINGERS IV SOLN: INTRAVENOUS | Status: DC

## 2019-09-16 MED ORDER — LINEZOLID 600 MG/300ML IV SOLN
600.0000 mg | Freq: Two times a day (BID) | INTRAVENOUS | Status: DC
  Administered 2019-09-16 – 2019-09-17 (×2): 600 mg via INTRAVENOUS
  Filled 2019-09-16 (×2): qty 300

## 2019-09-16 MED ORDER — LACTATED RINGERS IV SOLN
INTRAVENOUS | Status: DC | PRN
  Administered 2019-09-16: 14:00:00 via INTRAVENOUS

## 2019-09-16 MED ORDER — SODIUM CHLORIDE 0.9% FLUSH
10.0000 mL | INTRAVENOUS | Status: DC | PRN
  Administered 2019-09-17 – 2019-09-21 (×3): 10 mL
  Filled 2019-09-16 (×3): qty 40

## 2019-09-16 MED ORDER — BUPIVACAINE-EPINEPHRINE (PF) 0.5% -1:200000 IJ SOLN
INTRAMUSCULAR | Status: AC
  Filled 2019-09-16: qty 30

## 2019-09-16 MED ORDER — DARBEPOETIN ALFA 60 MCG/0.3ML IJ SOSY
60.0000 ug | PREFILLED_SYRINGE | INTRAMUSCULAR | Status: DC
  Administered 2019-09-22: 17:00:00 60 ug via SUBCUTANEOUS
  Filled 2019-09-16: qty 0.3

## 2019-09-16 MED ORDER — CALCIUM GLUCONATE-NACL 1-0.675 GM/50ML-% IV SOLN
1.0000 g | Freq: Once | INTRAVENOUS | Status: AC
  Administered 2019-09-16: 1000 mg via INTRAVENOUS
  Filled 2019-09-16 (×2): qty 50

## 2019-09-16 MED ORDER — HEPARIN SODIUM (PORCINE) 1000 UNIT/ML IJ SOLN
INTRAMUSCULAR | Status: DC | PRN
  Administered 2019-09-16: 2.6 mL via INTRAVENOUS

## 2019-09-16 MED ORDER — 0.9 % SODIUM CHLORIDE (POUR BTL) OPTIME
TOPICAL | Status: DC | PRN
  Administered 2019-09-16: 1000 mL

## 2019-09-16 MED ORDER — SUCCINYLCHOLINE CHLORIDE 200 MG/10ML IV SOSY
PREFILLED_SYRINGE | INTRAVENOUS | Status: AC
  Filled 2019-09-16: qty 10

## 2019-09-16 MED ORDER — PROPOFOL 10 MG/ML IV BOLUS
INTRAVENOUS | Status: AC
  Filled 2019-09-16: qty 20

## 2019-09-16 MED ORDER — ACETAMINOPHEN 160 MG/5ML PO SOLN: 325.0000 mg | Freq: Once | ORAL | Status: DC | PRN

## 2019-09-16 MED ORDER — ACETAMINOPHEN 10 MG/ML IV SOLN: 1000.0000 mg | Freq: Once | INTRAVENOUS | Status: DC | PRN

## 2019-09-16 MED ORDER — SODIUM CHLORIDE 0.9% FLUSH
10.0000 mL | Freq: Two times a day (BID) | INTRAVENOUS | Status: DC
  Administered 2019-09-17 – 2019-09-24 (×10): 10 mL
  Administered 2019-09-24: 20 mL
  Administered 2019-09-25 – 2019-09-29 (×6): 10 mL

## 2019-09-16 MED ORDER — PROPOFOL 10 MG/ML IV BOLUS
INTRAVENOUS | Status: DC | PRN
  Administered 2019-09-16: 200 mg via INTRAVENOUS

## 2019-09-16 MED ORDER — SUCCINYLCHOLINE CHLORIDE 200 MG/10ML IV SOSY
PREFILLED_SYRINGE | INTRAVENOUS | Status: DC | PRN
  Administered 2019-09-16: 160 mg via INTRAVENOUS

## 2019-09-16 MED ORDER — ROCURONIUM BROMIDE 10 MG/ML (PF) SYRINGE
PREFILLED_SYRINGE | INTRAVENOUS | Status: AC
  Filled 2019-09-16: qty 10

## 2019-09-16 MED ORDER — CHLORHEXIDINE GLUCONATE CLOTH 2 % EX PADS
6.0000 | MEDICATED_PAD | Freq: Every day | CUTANEOUS | Status: DC
  Administered 2019-09-17 – 2019-09-18 (×2): 6 via TOPICAL

## 2019-09-16 MED ORDER — LIDOCAINE 2% (20 MG/ML) 5 ML SYRINGE
INTRAMUSCULAR | Status: AC
  Filled 2019-09-16: qty 5

## 2019-09-16 MED ORDER — ACETAMINOPHEN 325 MG PO TABS: 325.0000 mg | ORAL_TABLET | Freq: Once | ORAL | Status: DC | PRN

## 2019-09-16 MED ORDER — FENTANYL CITRATE (PF) 100 MCG/2ML IJ SOLN: 25.0000 ug | INTRAMUSCULAR | Status: DC | PRN

## 2019-09-16 MED ORDER — BUPIVACAINE-EPINEPHRINE 0.5% -1:200000 IJ SOLN
INTRAMUSCULAR | Status: DC | PRN
  Administered 2019-09-16: 20 mL

## 2019-09-16 MED ORDER — FENTANYL CITRATE (PF) 100 MCG/2ML IJ SOLN
INTRAMUSCULAR | Status: DC | PRN
  Administered 2019-09-16: 150 ug via INTRAVENOUS

## 2019-09-16 MED ORDER — MIDAZOLAM HCL 2 MG/2ML IJ SOLN
INTRAMUSCULAR | Status: AC
  Filled 2019-09-16: qty 2

## 2019-09-16 MED ORDER — HEPARIN SODIUM (PORCINE) 1000 UNIT/ML IJ SOLN
INTRAMUSCULAR | Status: AC
  Filled 2019-09-16: qty 1

## 2019-09-16 MED ORDER — SODIUM CHLORIDE 0.9 % IV SOLN
INTRAVENOUS | Status: DC
  Administered 2019-09-16: 14:00:00 via INTRAVENOUS

## 2019-09-16 SURGICAL SUPPLY — 31 items
BNDG GAUZE ELAST 4 BULKY (GAUZE/BANDAGES/DRESSINGS) ×1 IMPLANT
CANISTER SUCT 3000ML PPV (MISCELLANEOUS) ×2 IMPLANT
COVER SURGICAL LIGHT HANDLE (MISCELLANEOUS) ×2 IMPLANT
COVER WAND RF STERILE (DRAPES) ×2 IMPLANT
DRAPE UTILITY XL STRL (DRAPES) ×3 IMPLANT
DRSG PAD ABDOMINAL 8X10 ST (GAUZE/BANDAGES/DRESSINGS) ×2 IMPLANT
ELECT CAUTERY BLADE 6.4 (BLADE) ×2 IMPLANT
ELECT REM PT RETURN 9FT ADLT (ELECTROSURGICAL) ×2
ELECTRODE REM PT RTRN 9FT ADLT (ELECTROSURGICAL) ×1 IMPLANT
GAUZE PACKING IODOFORM 1X5 (MISCELLANEOUS) IMPLANT
GAUZE SPONGE 4X4 12PLY STRL (GAUZE/BANDAGES/DRESSINGS) ×2 IMPLANT
GLOVE SURG SIGNA 7.5 PF LTX (GLOVE) ×2 IMPLANT
GOWN STRL REUS W/ TWL LRG LVL3 (GOWN DISPOSABLE) ×1 IMPLANT
GOWN STRL REUS W/ TWL XL LVL3 (GOWN DISPOSABLE) ×1 IMPLANT
GOWN STRL REUS W/TWL LRG LVL3 (GOWN DISPOSABLE) ×2
GOWN STRL REUS W/TWL XL LVL3 (GOWN DISPOSABLE) ×2
KIT BASIN OR (CUSTOM PROCEDURE TRAY) ×2 IMPLANT
KIT TURNOVER KIT B (KITS) ×2 IMPLANT
NS IRRIG 1000ML POUR BTL (IV SOLUTION) ×2 IMPLANT
PACK LITHOTOMY IV (CUSTOM PROCEDURE TRAY) ×2 IMPLANT
PAD ARMBOARD 7.5X6 YLW CONV (MISCELLANEOUS) ×2 IMPLANT
PENCIL BUTTON HOLSTER BLD 10FT (ELECTRODE) ×2 IMPLANT
SPONGE LAP 18X18 RF (DISPOSABLE) ×1 IMPLANT
SWAB COLLECTION DEVICE MRSA (MISCELLANEOUS) IMPLANT
SWAB CULTURE ESWAB REG 1ML (MISCELLANEOUS) IMPLANT
SYR BULB 3OZ (MISCELLANEOUS) ×2 IMPLANT
TOWEL GREEN STERILE (TOWEL DISPOSABLE) ×2 IMPLANT
TOWEL GREEN STERILE FF (TOWEL DISPOSABLE) ×2 IMPLANT
TUBE CONNECTING 12X1/4 (SUCTIONS) ×2 IMPLANT
UNDERPAD 30X30 (UNDERPADS AND DIAPERS) ×2 IMPLANT
YANKAUER SUCT BULB TIP NO VENT (SUCTIONS) ×2 IMPLANT

## 2019-09-16 NOTE — Anesthesia Postprocedure Evaluation (Signed)
Anesthesia Post Note  Patient: Maureen Jackson  Procedure(s) Performed: IRRIGATION AND DEBRIDEMENT LABIA ABSCESS (Left )     Patient location during evaluation: PACU Anesthesia Type: General Level of consciousness: awake and alert Pain management: pain level controlled Vital Signs Assessment: post-procedure vital signs reviewed and stable Respiratory status: spontaneous breathing, nonlabored ventilation, respiratory function stable and patient connected to nasal cannula oxygen Cardiovascular status: blood pressure returned to baseline and stable Postop Assessment: no apparent nausea or vomiting Anesthetic complications: no    Last Vitals:  Vitals:   09/16/19 1524 09/16/19 1530  BP: 128/63   Pulse:  71  Resp:  (!) 22  Temp:  36.6 C  SpO2:  96%    Last Pain:  Vitals:   09/16/19 1530  TempSrc:   PainSc: Asleep                 Effie Berkshire

## 2019-09-16 NOTE — Progress Notes (Signed)
PROGRESS NOTE    Maureen Jackson  HER:[id number] DOB: 13-Apr-1956 DOA: 09/08/2019 PCP: Neale Burly, MD    Brief Narrative:  63 year old female who presented with cough and weakness.  She does have significant past medical history for morbid obesity, systolic heart failure, hypothyroidism, hypertension, coronary artery disease and obstructive sleep apnea.  Reported generalized weakness, difficulty voiding, poor appetite and insomnia for about 3 days.  September 26, 2 days prior to hospitalization she was seen in the emergency department and a left labia upper thigh abscess was drained. Patient had worsening renal function in the setting of concerns for Fournier's gangrene, patient was transferred from [facility] to [facility] for further management.     Assessment & Plan:   Principal Problem:   Fournier's gangrene in female Active Problems:   Hypothyroidism   Essential hypertension   Morbid obesity/BMI > 55   Diabetes mellitus type 2 with complications, uncontrolled (HCC)   CAD, multiple vessel   Ischemic cardiomyopathy   Chronic combined systolic and diastolic CHF (congestive heart failure) (EF 30 to 35 %)   CAP (community acquired pneumonia)   Labial abscess   Abscess   Fistula     1. Mons/ labial abscess/ Fournier's gangrene/ complicated with sepsis/ acute metabolic encephalopathy.  -leukocytosis -- now with pus/sediment in foley-- repeat CT Scan done 10/5:Findings worrisome for Fournier's gangrene:  to OR when able with general surgery - Patient has been somnolent, no recent use of analgesics -Continue with antibiotic linezolid and unasyn.  -ASA and clopidogrel have been held for surgical debridement  2. AKI with volume overload/ non gap metabolic acidosis. Suspected acute tubular necrosis/ oliguria/ hypocalcemia/ hyperphosphatemia.  -CR appears stable but BUN increasing -nephrology consult appreciated -? Initiation of HD today  3. Acute on chronic systolic heart failure  exacerbation. EF 30 to 35%.  -volume control with HD until renal function recovers  4. Ischemic cardiomyopathy.  -SP DES to LAD and RCA 2018. Holding antiplatelet therapy for now, for surgical procedure.  5. T2DM.  -check blood sugars but no coverage for now  6. Morbid obesity with OSA -Continue Cpap -Estimated body mass index is 60.13 kg/m as calculated from the following:   Height as of this encounter: 5\' 5"  (1.651 m).   Weight as of this encounter: 163.9 kg.   7. Hypothyroid. On levothyroxine.     DVT prophylaxis: enoxaparin   Code Status: full Family Communication: no family at the bedside  Disposition Plan/ discharge barriers: needs OR today and possible HD   Malnutrition Type:  Nutrition Problem: Inadequate oral intake Etiology: acute illness(pneumonia; abscess in left labia/upper thigh area)   Malnutrition Characteristics:  Signs/Symptoms: meal completion < 25%   Nutrition Interventions:  Interventions: Ensure Enlive (each supplement provides 350kcal and 20 grams of protein), MVI    Consultants:   Nephrology   Surgery   ID  Antimicrobials:   linezolid  Ceftriaxone    Subjective: Plan is for OR today Refused bipap last PM   Objective: Vitals:   09/15/19 2356 09/16/19 0300 09/16/19 0325 09/16/19 0809  BP: (!) 135/51  (!) 114/43 (!) 108/50  Pulse: 77  76 71  Resp: 17  17 18   Temp: 98.3 F (36.8 C)  98.6 F (37 C) 98.7 F (37.1 C)  TempSrc: Oral  Oral Oral  SpO2: 96%  97% 95%  Weight:  (!) 163.9 kg    Height:        Intake/Output Summary (Last 24 hours) at 09/16/2019 8563 Last data filed  at 09/16/2019 0736 Gross per 24 hour  Intake 977 ml  Output 1900 ml  Net -923 ml   Filed Weights   09/08/19 1517 09/16/19 0300  Weight: (!) 152 kg (!) 163.9 kg    Examination:  Continues to be lethargic rrr Diminished breath sounds +BS, obese, NT Wound on left labia      Data Reviewed: I have personally reviewed following  labs and imaging studies  CBC: Recent Labs  Lab 09/11/19 0813 09/12/19 0531 09/13/19 0311 09/14/19 0815 09/15/19 0511 09/16/19 0444  WBC 18.1* 20.9* 20.2* 22.7* 21.0* 22.9*  NEUTROABS 12.9*  --   --  18.1* 16.7*  --   HGB 8.8* 8.6* 9.3* 8.1* 8.4* 8.8*  HCT 27.4* 26.3* 27.6* 25.1* 25.8* 26.9*  MCV 86.7 85.7 84.1 85.4 85.1 84.6  PLT 244 245 269 343 283 161   Basic Metabolic Panel: Recent Labs  Lab 09/12/19 0531 09/13/19 0311 09/14/19 0815 09/15/19 0511 09/16/19 0444  NA 132* 132* 132* 132* 133*  K 4.3 4.2 4.9 4.6 4.3  CL 99 98 97* 98 96*  CO2 18* 17* 17* 17* 16*  GLUCOSE 75 118* 83 87 94  BUN 77* 88* 96* 102* 104*  CREATININE 6.90* 7.42* 7.53* 7.30* 6.72*  CALCIUM 6.6* 6.5* 5.9* 5.9* 6.2*  PHOS 8.1* 8.2* 9.3* 9.1* 8.3*   GFR: Estimated Creatinine Clearance: 13.9 mL/min (A) (by C-G formula based on SCr of 6.72 mg/dL (H)). Liver Function Tests: Recent Labs  Lab 09/10/19 0559 09/11/19 0813 09/12/19 0531 09/13/19 0311 09/14/19 0815 09/15/19 0511 09/16/19 0444  AST 52* 73*  --   --   --   --   --   ALT 32 42  --   --   --   --   --   ALKPHOS 164* 166*  --   --   --   --   --   BILITOT 1.0 1.0  --   --   --   --   --   PROT 6.7 6.2*  --   --   --   --   --   ALBUMIN 2.1* 1.6* 1.4* 1.4* 1.3* 1.5* 1.5*   No results for input(s): LIPASE, AMYLASE in the last 168 hours. No results for input(s): AMMONIA in the last 168 hours. Coagulation Profile: No results for input(s): INR, PROTIME in the last 168 hours. Cardiac Enzymes: Recent Labs  Lab 09/10/19 1054  CKTOTAL 732*   BNP (last 3 results) No results for input(s): PROBNP in the last 8760 hours. HbA1C: No results for input(s): HGBA1C in the last 72 hours. CBG: Recent Labs  Lab 09/15/19 0906 09/15/19 1122 09/15/19 1623 09/15/19 2144 09/16/19 0558  GLUCAP 78 118* 78 107* 84   Lipid Profile: No results for input(s): CHOL, HDL, LDLCALC, TRIG, CHOLHDL, LDLDIRECT in the last 72 hours. Thyroid Function  Tests: No results for input(s): TSH, T4TOTAL, FREET4, T3FREE, THYROIDAB in the last 72 hours. Anemia Panel: Recent Labs    09/15/19 1636  FERRITIN 390*  TIBC 178*  IRON 37      Radiology Studies: I have reviewed all of the imaging during this hospital visit personally     Scheduled Meds: . atorvastatin  80 mg Oral QPM  . calcium acetate  1,334 mg Oral TID WC  . Chlorhexidine Gluconate Cloth  6 each Topical Daily  . Chlorhexidine Gluconate Cloth  6 each Topical Q0600  . heparin injection (subcutaneous)  5,000 Units Subcutaneous Q8H  . levothyroxine  125  mcg Oral Q0600  . multivitamin with minerals  1 tablet Oral Daily  . nystatin   Topical TID  . Ensure Max Protein  11 oz Oral BID  . sodium bicarbonate  650 mg Oral TID   Continuous Infusions: . ampicillin-sulbactam (UNASYN) IV 3 g (09/15/19 2342)  . linezolid (ZYVOX) IV Stopped (09/15/19 2341)  . sodium chloride       LOS: 8 days     Geradine Girt, DO

## 2019-09-16 NOTE — Progress Notes (Signed)
CRITICAL VALUE ALERT  Critical Value:  Calcium level 6.2  Date & Time Notied:  09/16/2019 at [place]  Provider Notified: Provider on-call for [facility]  Orders Received/Actions taken: Waiting for provider's response

## 2019-09-16 NOTE — Transfer of Care (Signed)
Immediate Anesthesia Transfer of Care Note  Patient: Maureen Jackson  Procedure(s) Performed: IRRIGATION AND DEBRIDEMENT LABIA ABSCESS (Left )  Patient Location: PACU  Anesthesia Type:General  Level of Consciousness: drowsy and patient cooperative  Airway & Oxygen Therapy: Patient Spontanous Breathing and Patient connected to face mask oxygen  Post-op Assessment: Report given to RN and Post -op Vital signs reviewed and stable  Post vital signs: Reviewed and stable  Last Vitals:  Vitals Value Taken Time  BP 128/62 09/16/19 1509  Temp    Pulse 66 09/16/19 1511  Resp 19 09/16/19 1511  SpO2 99 % 09/16/19 1511  Vitals shown include unvalidated device data.  Last Pain:  Vitals:   09/16/19 0809  TempSrc: Oral  PainSc:       Patients Stated Pain Goal: 1 (15/04/13 6438)  Complications: No apparent anesthesia complications

## 2019-09-16 NOTE — Progress Notes (Signed)
Patient ID: Maureen Jackson, female   DOB: [birth year], 63 y.o.   MRN: [id number]   Pre Procedure note for inpatients:   Maureen Jackson has been scheduled for Procedure(s): IRRIGATION AND DEBRIDEMENT LABIA ABSCESS (Left) today. The various methods of treatment have been discussed with the patient. After consideration of the risks, benefits and treatment options the patient has consented to the planned procedure.   The patient has been seen and labs reviewed. There are no changes in the patient's condition to prevent proceeding with the planned procedure today.  Recent labs:  Lab Results  Component Value Date   WBC 22.9 (H) 09/16/2019   HGB 8.8 (L) 09/16/2019   HCT 26.9 (L) 09/16/2019   PLT 281 09/16/2019   GLUCOSE 94 09/16/2019   CHOL 147 07/17/2018   TRIG 167 (H) 07/17/2018   HDL 31 (L) 07/17/2018   LDLCALC 83 07/17/2018   ALT 42 09/11/2019   AST 73 (H) 09/11/2019   NA 133 (L) 09/16/2019   K 4.3 09/16/2019   CL 96 (L) 09/16/2019   CREATININE 6.72 (H) 09/16/2019   BUN 104 (H) 09/16/2019   CO2 16 (L) 09/16/2019   TSH 0.181 (L) 07/17/2018   INR 1.2 09/08/2019   HGBA1C 10.2 (H) 09/08/2019    Maureen Keens, MD 09/16/2019 1:47 PM

## 2019-09-16 NOTE — Anesthesia Preprocedure Evaluation (Signed)
Anesthesia Evaluation  Patient identified by MRN, date of birth, ID band Patient awake    Reviewed: Allergy & Precautions, NPO status , Patient's Chart, lab work & pertinent test results  Airway Mallampati: IV  TM Distance: <3 FB Neck ROM: Full    Dental  (+) Edentulous Upper   Pulmonary sleep apnea , former smoker,     + decreased breath sounds      Cardiovascular hypertension, Pt. on medications and Pt. on home beta blockers + CAD and +CHF  + dysrhythmias  Rhythm:Regular Rate:Normal     Neuro/Psych TIA Neuromuscular disease negative psych ROS   GI/Hepatic Neg liver ROS, GERD  ,  Endo/Other  diabetes, Type 2, Insulin Dependent, Oral Hypoglycemic AgentsHypothyroidism   Renal/GU      Musculoskeletal   Abdominal (+) + obese,   Peds  Hematology   Anesthesia Other Findings   Reproductive/Obstetrics                             Anesthesia Physical Anesthesia Plan  ASA: IV  Anesthesia Plan: General   Post-op Pain Management:    Induction: Intravenous, Rapid sequence and Cricoid pressure planned  PONV Risk Score and Plan: 4 or greater and Dexamethasone, Ondansetron and Treatment may vary due to age or medical condition  Airway Management Planned: Oral ETT and Video Laryngoscope Planned  Additional Equipment: None  Intra-op Plan:   Post-operative Plan: Extubation in OR  Informed Consent: I have reviewed the patients History and Physical, chart, labs and discussed the procedure including the risks, benefits and alternatives for the proposed anesthesia with the patient or authorized representative who has indicated his/her understanding and acceptance.     Dental advisory given  Plan Discussed with: CRNA  Anesthesia Plan Comments:         Anesthesia Quick Evaluation

## 2019-09-16 NOTE — Progress Notes (Signed)
CC: Weakness and cough, left labial abscess  Subjective: Patient admitted 10/1 with labial abscess/possible Fournier's gangrene from any pain hospital surgery postponed 10/2 secondary to Plavix with plans for washout this week.  Objective: Vital signs in last 24 hours: Temp:  [98.2 F (36.8 C)-98.7 F (37.1 C)] 98.7 F (37.1 C) (10/06 0809) Pulse Rate:  [65-80] 71 (10/06 0809) Resp:  [17-20] 18 (10/06 0809) BP: (108-135)/(43-81) 108/50 (10/06 0809) SpO2:  [95 %-99 %] 95 % (10/06 0809) Weight:  [163.9 kg] 163.9 kg (10/06 0300) Last BM Date: (pt unable to tell me) 177 p.o. 800 IV 1550 urine Afebrile, VSS NA 133 Creatinine 6.72 Potassium 4.3  albumin 1.5 Blood cultures -9/28 Urine culture greater than 100,000 colonies of yeast 9/30 CT of the pelvis 09/15/2019 without contrast: Persistent soft tissue gas and inflammatory changes at the mons pubis, raising concern for necrotizing infection.  Slight decrease gas collection left labia with what appears to be packing material.  No definitive focal collection left labia to suggest residual abscess.  Exam limited by habitus ongoing edema inflammatory changes soft tissue infection.  Mild left inguinal adenopathy.   Intake/Output from previous day: 10/05 0701 - 10/06 0700 In: 19 [P.O.:177; IV Piggyback:800] Out: 1550 [Urine:1550] Intake/Output this shift: Total I/O In: -  Out: 350 [Urine:350]  General appearance: pt is lethargic and disorieted, knows her name, not able to tell me where she is, year 2014.  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   Resp: clear anterior exam Skin: Skin color, texture, turgor normal. No rashes or lesions or see picture below.  09/15/09, 2 cm blister adjacent to the original site of drainage, new site is about 9 cm from the first site.    Lab Results:  Recent Labs    09/15/19 0511 09/16/19 0444  WBC 21.0* 22.9*  HGB 8.4* 8.8*  HCT 25.8* 26.9*  PLT 283 281    BMET  Recent Labs    09/15/19 0511 09/16/19 0444  NA 132* 133*  K 4.6 4.3  CL 98 96*  CO2 17* 16*  GLUCOSE 87 94  BUN 102* 104*  CREATININE 7.30* 6.72*  CALCIUM 5.9* 6.2*   PT/INR No results for input(s): LABPROT, INR in the last 72 hours.  Recent Labs  Lab 09/10/19 0559 09/11/19 0813 09/12/19 0531 09/13/19 0311 09/14/19 0815 09/15/19 0511 09/16/19 0444  AST 52* 73*  --   --   --   --   --   ALT 32 42  --   --   --   --   --   ALKPHOS 164* 166*  --   --   --   --   --   BILITOT 1.0 1.0  --   --   --   --   --   PROT 6.7 6.2*  --   --   --   --   --   ALBUMIN 2.1* 1.6* 1.4* 1.4* 1.3* 1.5* 1.5*     Lipase  No results found for: LIPASE   Medications: . atorvastatin  80 mg Oral QPM  . calcium acetate  1,334 mg Oral TID WC  . Chlorhexidine Gluconate Cloth  6 each Topical Daily  . Chlorhexidine Gluconate Cloth  6 each Topical Q0600  . heparin injection (subcutaneous)  5,000 Units Subcutaneous Q8H  . levothyroxine  125 mcg Oral Q0600  . multivitamin with minerals  1 tablet Oral Daily  . nystatin   Topical TID  .  Ensure Max Protein  11 oz Oral BID  . sodium bicarbonate  650 mg Oral TID    Assessment/Plan Morbid obesity BMI 60.13 DM CAD with cardiac stents in place on plavix Hx ischemic cardiomyopathy Acute on chronic systolic heart failure EF 30 -35% Anemia Multifactorial encephalopathy Hypothyroidism HLD AKI on CKD   - Creatinine 1.88(9/28)>>4.13(9/30)>>7.42(10/3)>>6.72(10/6)   Sepsis with left labial abscess - CT scan from 9/30 shows extensive inflammatory/infectious process involving the mons pubis, labia, perineum and suprapubic regions with extensive gas formation worrisome for Fournier's gangrene; no discrete drainable soft tissue abscess - Wound is still open and draining. WBC is stable.Continue broad spectrum antibiotics per IDand wound care for now - OR tomorrow 10/06  -Leukocytosis: WBC 21.0(9/28)>> 20.9(10/2)>> 22.9(10/6)  ID: Vancomycin 9/28 x  2 doses; Rocephin 9/28-10/2, azithromycin 9/28-10/2, clindamycin 9/30-10/2. Currently on Zyvox and Unasyn 10/2 >> day 5 VTE -SCDs, sq heparin, last does of plavix 10/02 FEN -IVF,CM, NPO at midnight Foley -in place Follow up -TBD. POCEzinne, Yogi Daughter [phone]  [phone]  Price,[name]   [phone]  [name] Sister [phone]    [name]   [phone]     Plan: Patient scheduled to get an IR dialysis catheter today and for dialysis.  Will discuss timing for the OR with Dr. Eliseo Squires and Dr. Ninfa Linden.  Last heparin dose at 0542, nest dose is scheduled for 1400 hrs.      LOS: 8 days    Fischer Halley 09/16/2019 [phone]

## 2019-09-16 NOTE — Progress Notes (Signed)
Visited patients room to place on CPAP pt refused RN made aware

## 2019-09-16 NOTE — Procedures (Signed)
Interventional Radiology Procedure Note  Procedure: Placement of a right IJ approach triple lumen temp HD catheter.  OK to use. Complications: None Recommendations:  - Ok to convert if needed. - Ok to use - Do not submerge - Routine line care   Signed,  Dulcy Fanny. Earleen Newport, DO

## 2019-09-16 NOTE — Progress Notes (Signed)
Subjective:   More alert today Antibiotics:  Anti-infectives (From admission, onward)   Start     Dose/Rate Route Frequency Ordered Stop   09/12/19 1100  linezolid (ZYVOX) IVPB 600 mg     600 mg 300 mL/hr over 60 Minutes Intravenous Every 12 hours 09/12/19 0945     09/12/19 1100  Ampicillin-Sulbactam (UNASYN) 3 g in sodium chloride 0.9 % 100 mL IVPB     3 g 200 mL/hr over 30 Minutes Intravenous Every 12 hours 09/12/19 0945     09/10/19 1430  clindamycin (CLEOCIN) IVPB 600 mg  Status:  Discontinued     600 mg 100 mL/hr over 30 Minutes Intravenous Every 6 hours 09/10/19 1410 09/12/19 0945   09/10/19 0742  vancomycin variable dose per unstable renal function (pharmacist dosing)  Status:  Discontinued      Does not apply See admin instructions 09/10/19 0742 09/10/19 1413   09/10/19 0000  vancomycin (VANCOCIN) 1,750 mg in sodium chloride 0.9 % 500 mL IVPB  Status:  Discontinued     1,750 mg 250 mL/hr over 120 Minutes Intravenous Every 24 hours 09/09/19 0912 09/09/19 0928   09/10/19 0000  vancomycin (VANCOCIN) 1,750 mg in sodium chloride 0.9 % 500 mL IVPB  Status:  Discontinued     1,750 mg 250 mL/hr over 120 Minutes Intravenous Every 24 hours 09/09/19 0940 09/10/19 0751   09/09/19 1030  vancomycin (VANCOCIN) IVPB 750 mg/150 ml premix  Status:  Discontinued     750 mg 150 mL/hr over 60 Minutes Intravenous  Once 09/09/19 0928 09/09/19 0938   09/09/19 0930  vancomycin (VANCOCIN) IVPB 1000 mg/200 mL premix  Status:  Discontinued     1,000 mg 200 mL/hr over 60 Minutes Intravenous  Once 09/09/19 0928 09/09/19 0938   09/08/19 2300  azithromycin (ZITHROMAX) 500 mg in sodium chloride 0.9 % 250 mL IVPB  Status:  Discontinued     500 mg 250 mL/hr over 60 Minutes Intravenous Every 24 hours 09/08/19 2230 09/12/19 0945   09/08/19 2300  cefTRIAXone (ROCEPHIN) 2 g in sodium chloride 0.9 % 100 mL IVPB  Status:  Discontinued     2 g 200 mL/hr over 30 Minutes Intravenous Every 24 hours  09/08/19 2230 09/12/19 0945   09/08/19 2300  vancomycin (VANCOCIN) 1,500 mg in sodium chloride 0.9 % 500 mL IVPB     1,500 mg 250 mL/hr over 120 Minutes Intravenous  Once 09/08/19 2254 09/09/19 0434   09/08/19 1600  vancomycin (VANCOCIN) IVPB 1000 mg/200 mL premix     1,000 mg 200 mL/hr over 60 Minutes Intravenous  Once 09/08/19 1558 09/08/19 1929   09/08/19 1600  piperacillin-tazobactam (ZOSYN) IVPB 3.375 g     3.375 g 100 mL/hr over 30 Minutes Intravenous  Once 09/08/19 1558 09/08/19 1655      Medications: Scheduled Meds: . atorvastatin  80 mg Oral QPM  . calcium acetate  1,334 mg Oral TID WC  . Chlorhexidine Gluconate Cloth  6 each Topical Daily  . Chlorhexidine Gluconate Cloth  6 each Topical Q0600  . heparin injection (subcutaneous)  5,000 Units Subcutaneous Q8H  . levothyroxine  125 mcg Oral Q0600  . multivitamin with minerals  1 tablet Oral Daily  . nystatin   Topical TID  . Ensure Max Protein  11 oz Oral BID  . sodium bicarbonate  650 mg Oral TID   Continuous Infusions: . ampicillin-sulbactam (UNASYN) IV 3 g (09/15/19 2342)  . calcium gluconate    .  linezolid (ZYVOX) IV Stopped (09/15/19 2341)  . sodium chloride     PRN Meds:.acetaminophen, ipratropium-albuterol, naloxone    Objective: Weight change:   Intake/Output Summary (Last 24 hours) at 09/16/2019 1104 Last data filed at 09/16/2019 0941 Gross per 24 hour  Intake 977 ml  Output 1350 ml  Net -373 ml   Blood pressure (!) 108/50, pulse 71, temperature 98.7 F (37.1 C), temperature source Oral, resp. rate 18, height 5\' 5"  (1.651 m), weight (!) 163.9 kg, SpO2 95 %. Temp:  [98.2 F (36.8 C)-98.7 F (37.1 C)] 98.7 F (37.1 C) (10/06 0809) Pulse Rate:  [65-80] 71 (10/06 0809) Resp:  [17-20] 18 (10/06 0809) BP: (108-135)/(43-81) 108/50 (10/06 0809) SpO2:  [95 %-99 %] 95 % (10/06 0809) Weight:  [163.9 kg] 163.9 kg (10/06 0300)  Physical Exam: General still fairly confused CVS regular rate, normal  Chest:  , no wheezing, no respiratory distress Abdomen: soft non-distended,  She pus draining from labial abscess, urine seems more clear today Neuro: nonfocal  CBC:    BMET Recent Labs    09/15/19 0511 09/16/19 0444  NA 132* 133*  K 4.6 4.3  CL 98 96*  CO2 17* 16*  GLUCOSE 87 94  BUN 102* 104*  CREATININE 7.30* 6.72*  CALCIUM 5.9* 6.2*     Liver Panel  Recent Labs    09/15/19 0511 09/16/19 0444  ALBUMIN 1.5* 1.5*       Sedimentation Rate No results for input(s): ESRSEDRATE in the last 72 hours. C-Reactive Protein No results for input(s): CRP in the last 72 hours.  Micro Results: Recent Results (from the past 720 hour(s))  Urine culture     Status: Abnormal   Collection Time: 09/08/19  3:58 PM   Specimen: In/Out Cath Urine  Result Value Ref Range Status   Specimen Description   Final    IN/OUT CATH URINE Performed at [facility], [address] [place], [place] [address]    Special Requests   Final    NONE Performed at [facility], [address], [place], [place] [address]    Culture >=100,000 COLONIES/mL YEAST (A)  Final   Report Status 09/10/2019 FINAL  Final  SARS Coronavirus 2 [facility] order, Performed in [facility] hospital lab) Nasopharyngeal Nasopharyngeal Swab     Status: None   Collection Time: 09/08/19  3:59 PM   Specimen: Nasopharyngeal Swab  Result Value Ref Range Status   SARS Coronavirus 2 NEGATIVE NEGATIVE Final    Comment: (NOTE) If result is NEGATIVE SARS-CoV-2 target nucleic acids are NOT DETECTED. The SARS-CoV-2 RNA is generally detectable in upper and lower  respiratory specimens during the acute phase of infection. The lowest  concentration of SARS-CoV-2 viral copies this assay can detect is 250  copies / mL. A negative result does not preclude SARS-CoV-2 infection  and should not be used as the sole basis for treatment or other  patient management decisions.  A negative result may occur with  improper specimen collection /  handling, submission of specimen other  than nasopharyngeal swab, presence of viral mutation(s) within the  areas targeted by this assay, and inadequate number of viral copies  (<250 copies / mL). A negative result must be combined with clinical  observations, patient history, and epidemiological information. If result is POSITIVE SARS-CoV-2 target nucleic acids are DETECTED. The SARS-CoV-2 RNA is generally detectable in upper and lower  respiratory specimens dur ing the acute phase of infection.  Positive  results are indicative of active  infection with SARS-CoV-2.  Clinical  correlation with patient history and other diagnostic information is  necessary to determine patient infection status.  Positive results do  not rule out bacterial infection or co-infection with other viruses. If result is PRESUMPTIVE POSTIVE SARS-CoV-2 nucleic acids MAY BE PRESENT.   A presumptive positive result was obtained on the submitted specimen  and confirmed on repeat testing.  While 2019 novel coronavirus  (SARS-CoV-2) nucleic acids may be present in the submitted sample  additional confirmatory testing may be necessary for epidemiological  and / or clinical management purposes  to differentiate between  SARS-CoV-2 and other Sarbecovirus currently known to infect humans.  If clinically indicated additional testing with an alternate test  methodology [id number]) is advised. The SARS-CoV-2 RNA is generally  detectable in upper and lower respiratory sp ecimens during the acute  phase of infection. The expected result is Negative. Fact Sheet for Patients:  StrictlyIdeas.no Fact Sheet for Healthcare Providers: BankingDealers.co.za This test is not yet approved or cleared by the [place] FDA and has been authorized for detection and/or diagnosis of SARS-CoV-2 by FDA under an Emergency Use Authorization (EUA).  This EUA will remain in effect (meaning this  test can be used) for the duration of the COVID-19 declaration under Section 564(b)(1) of the Act, 21 U.S.C. section 360bbb-3(b)(1), unless the authorization is terminated or revoked sooner. Performed at [facility], [address], [place], [place] [address]   Blood Culture (routine x 2)     Status: None   Collection Time: 09/08/19  4:07 PM   Specimen: BLOOD RIGHT WRIST  Result Value Ref Range Status   Specimen Description BLOOD RIGHT WRIST  Final   Special Requests   Final    BOTTLES DRAWN AEROBIC AND ANAEROBIC Blood Culture adequate volume   Culture   Final    NO GROWTH 5 DAYS Performed at [facility], [address], [place], [place] [address]    Report Status 09/13/2019 FINAL  Final  Blood Culture (routine x 2)     Status: None   Collection Time: 09/08/19  4:17 PM   Specimen: BLOOD LEFT HAND  Result Value Ref Range Status   Specimen Description BLOOD LEFT HAND  Final   Special Requests   Final    BOTTLES DRAWN AEROBIC AND ANAEROBIC Blood Culture adequate volume   Culture   Final    NO GROWTH 5 DAYS Performed at [facility], [address], [place], [place] [address]    Report Status 09/13/2019 FINAL  Final    Studies/Results: Ct Pelvis Wo Contrast  Result Date: 09/16/2019 CLINICAL DATA:  Groin and labial abscess EXAM: CT PELVIS WITHOUT CONTRAST TECHNIQUE: Multidetector CT imaging of the pelvis was performed following the standard protocol without intravenous contrast. COMPARISON:  CT 09/10/2019 FINDINGS: Urinary Tract: Large habitus results in overall grainy appearance of images and limits the exam. Foley catheter in the bladder. Bowel:  Unremarkable visualized pelvic bowel loops. Vascular/Lymphatic: Enlarged left inguinal lymph nodes up to 12 mm. Reproductive: Status post hysterectomy. No adnexal mass. Persistent gas and inflammatory change within the mons pubis with small gas in soft tissue thickening tracking to the left labia. Suspected packing material at the left labia.  No appreciable focal fluid collection though no intravenous contrast given. Other:  No pelvic effusion Musculoskeletal: No acute osseous abnormality IMPRESSION: 1. Persistent soft tissue gas and inflammatory change at the mons pubis, raising concern for necrotizing infection. Slight decreased gas collection at the left labia with what appears  to be packing material. No definitive focal fluid collection at the left labia to suggest residual abscess however examination limited by habitus and absence of intravenous contrast. There is ongoing edema and inflammatory change, compatible with soft tissue infection. 2. Mild left inguinal adenopathy, likely reactive Electronically Signed   By: Donavan Foil M.D.   On: 09/16/2019 00:17   Dg Chest Port 1 View  Result Date: 09/15/2019 CLINICAL DATA:  Reason for exam: AMS Nurse tech reports patient has bilateral pneumonia. Hx of LBBB, htn, diabetes, cad, chf. Hx of coronary stent. Quit smoking 2007. EXAM: PORTABLE CHEST 1 VIEW COMPARISON:  Chest radiograph 09/13/2019 FINDINGS: Stable cardiomediastinal contours with enlarged heart size. There are persistent bilateral airspace opacities predominantly in the lower lungs. No pneumothorax or large pleural effusion. No acute finding in the visualized skeleton. IMPRESSION: Persistent predominantly lower lobe airspace opacities bilaterally most likely representing infection. Electronically Signed   By: Audie Pinto M.D.   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 female with Dubiel abscess with Fournier's and sepsis and endorgan failure now with purulent material coming from her catheter yesterday that made me concerned that she may have a fistula between her abscess and bladder.   #1 Labial abscess with concern for Fournier's, fistula with sepsis and end organ failure including ARF  ---continue unasyn and zyvox  --she needs surgery  I have wondered about fistula  GET intraop cultures  #2 ARF: going onto HD      LOS: 8 days   Rhina Brackett Dam 09/16/2019, 11:04 AM

## 2019-09-16 NOTE — Anesthesia Procedure Notes (Signed)
Procedure Name: Intubation Date/Time: 09/16/2019 2:28 PM Performed by: Lance Coon, CRNA Pre-anesthesia Checklist: Patient identified, Emergency Drugs available, Suction available, Patient being monitored and Timeout performed Patient Re-evaluated:Patient Re-evaluated prior to induction Oxygen Delivery Method: Circle system utilized Preoxygenation: Pre-oxygenation with 100% oxygen Induction Type: IV induction and Rapid sequence Laryngoscope Size: Glidescope and 4 Grade View: Grade I Tube type: Oral Tube size: 7.0 mm Number of attempts: 1 Airway Equipment and Method: Stylet Placement Confirmation: ETT inserted through vocal cords under direct vision,  positive ETCO2 and breath sounds checked- equal and bilateral Secured at: 21 cm Tube secured with: Tape Dental Injury: Teeth and Oropharynx as per pre-operative assessment

## 2019-09-16 NOTE — Op Note (Signed)
IRRIGATION AND DEBRIDEMENT LABIA ABSCESS  Procedure Note  Maureen Jackson 09/16/2019   Pre-op Diagnosis: mons abscess     Post-op Diagnosis: same  Procedure(s): IRRIGATION AND DEBRIDEMENT MONS ABSCESS (6 CM X 6 CM X 8 CM)   Surgeon(s): Coralie Keens, MD  Anesthesia: General  Staff:  Circulator: Carlynn Purl, RN Scrub Person: Dollene Cleveland T Circulator Assistant: Quincy Carnes, RN  Estimated Blood Loss: Minimal               Findings: The patient had an abscess of her mons pubis with a 6 cm x 6 cm x 8 cm in depth area of necrosis of the fat.  This was sharply debrided to what appeared to be healthy tissue with a scalpel.  Procedure: The patient was brought to the operating room and identifies correct patient.  She is placed upon the operating table general anesthesia was induced.  Her pelvic area and mons were then prepped and draped in usual sterile fashion.  She already had a area of necrotic skin at the midline of the mons which was draining.  I excised this area completely with a scalpel including the underlying fat which was necrotic.  I excised a 6 cm x 6 cm x 8 cm in depth area of skin and fat.  This did communicate with a small open wound near the left labia.  Once the tissue was excised, healthy tissue appeared to be achieved.  I irrigated the wound with a liter of saline.  I achieved hemostasis with the cautery.  I anesthetized for the Marcaine.  I then packed with wet-to-dry Kerlix.  She appeared to tolerate procedure fairly well.  She was then extubated in the operating room and taken in a guarded condition to the recovery room.          Coralie Keens   Date: 09/16/2019  Time: 2:56 PM

## 2019-09-16 NOTE — Progress Notes (Signed)
Patient ID: Maureen Jackson, female   DOB: 10-31-1956, 63 y.o.   MRN: [id number] [facility] Progress Note   Assessment/ Plan:   1. Acute kidney Injury: Likely ATN from sepsis/vancomycin nephrotoxicity with differentials including AIN.  Low index of suspicion that this is an acute GN based on initial urinalysis/urine microscopy findings.   - Had nontunneled catheter (leukocytosis) on 10/6 and initiation of HD planned afterward for clearance - Plan for HD on 10/7 as well  - Continue to monitor for recovery - nonoliguric   2. Encephalopathy -multifactorial with AKI as well as OSA, narcotics  3.  Anion gap metabolic acidosis: Secondary to acute kidney injury/SIRS from infection.  Stopping bicarb and starting HD  4.  Left labial abscess versus Fournier's gangrene: Earlier CT scan suggestive of Fournier's gangrene prompting transfer to [facility] for possible surgical intervention.  On examination found to be having local labial abscess with drainage-ongoing intravenous antibiotics with persistent leukocytosis with wound care/antibiotic treatment; s/p I&D on 10/6   5.  Anemia: Secondary to acute/critical illness, no overt loss.  Continue to monitor trend. Check iron stores.  aranesp started on 10/5.    6.  Hyperphosphatemia: Secondary to acute kidney injury and started on calcium acetate.  She is initiating HD  Subjective:   She had a nontunneled dialysis catheter placed earlier today given leukocytosis.  Also had I&D earlier today.  She had 1.6 liters UOP over 10/5.  Spoke with her daughter at bedside.   Review of systems:  Denies shortness of breath  Daughter reprots confusion Urinating via foley  Denies n/v   Objective:   BP 128/63   Pulse 71   Temp 97.8 F (36.6 C)   Resp (!) 22   Ht 5' 5"  (1.651 m)   Wt (!) 163.9 kg   SpO2 96%   BMI 60.13 kg/m   Intake/Output Summary (Last 24 hours) at 09/16/2019 1752 Last data filed at 09/16/2019 1530 Gross per 24 hour  Intake  967 ml  Output 1875 ml  Net -908 ml   Weight change:   Physical Exam:  Gen: Morbidly obese female. Arouses to voice with exam CVS: RRR no rub  Resp: decreased breath sounds; transmitted upper airway sounds Abd: Soft, obese, nontender, bowel sounds normal Ext: With lymphedema and trace edema Neuro - states year is 2020; oriented to person, not to location "[place] parking lot" GU has a foley  Access RIJ nontunneled dialysis catheter  Imaging: Ct Pelvis Wo Contrast  Result Date: 09/16/2019 CLINICAL DATA:  Groin and labial abscess EXAM: CT PELVIS WITHOUT CONTRAST TECHNIQUE: Multidetector CT imaging of the pelvis was performed following the standard protocol without intravenous contrast. COMPARISON:  CT 09/10/2019 FINDINGS: Urinary Tract: Large habitus results in overall grainy appearance of images and limits the exam. Foley catheter in the bladder. Bowel:  Unremarkable visualized pelvic bowel loops. Vascular/Lymphatic: Enlarged left inguinal lymph nodes up to 12 mm. Reproductive: Status post hysterectomy. No adnexal mass. Persistent gas and inflammatory change within the mons pubis with small gas in soft tissue thickening tracking to the left labia. Suspected packing material at the left labia. No appreciable focal fluid collection though no intravenous contrast given. Other:  No pelvic effusion Musculoskeletal: No acute osseous abnormality IMPRESSION: 1. Persistent soft tissue gas and inflammatory change at the mons pubis, raising concern for necrotizing infection. Slight decreased gas collection at the left labia with what appears to be packing material. No definitive focal fluid collection at the left labia to  suggest residual abscess however examination limited by habitus and absence of intravenous contrast. There is ongoing edema and inflammatory change, compatible with soft tissue infection. 2. Mild left inguinal adenopathy, likely reactive Electronically Signed   By: Donavan Foil M.D.   On:  09/16/2019 00:17   Ir Fluoro Guide Cv Line Right  Result Date: 09/16/2019 INDICATION: 63 year old female with a history of renal failure and leukocytosis EXAM: IMAGE GUIDED PLACEMENT OF TEMPORARY HEMODIALYSIS CATHETER MEDICATIONS: NONE ANESTHESIA/SEDATION: NONE FLUOROSCOPY TIME:  Fluoroscopy Time: 0 minutes 6 seconds (2 mGy). COMPLICATIONS: None PROCEDURE: Informed written consent was obtained from the patient's family after a discussion of the risks, benefits, and alternatives to treatment. Questions regarding the procedure were encouraged and answered. The right neck was prepped with chlorhexidine in a sterile fashion, and a sterile drape was applied covering the operative field. Maximum barrier sterile technique with sterile gowns and gloves were used for the procedure. A timeout was performed prior to the initiation of the procedure. A micropuncture kit was utilized to access the right internal jugular vein under direct, real-time ultrasound guidance after the overlying soft tissues were anesthetized with 1% lidocaine with epinephrine. Ultrasound image documentation was performed. The microwire was kinked to measure appropriate catheter length. A stiff glidewire was advanced to the level of the IVC. A 16 cm hemodialysis catheter was then placed over the wire. Final catheter positioning was confirmed and documented with a spot radiographic image. The catheter aspirates and flushes normally. The catheter was flushed with appropriate volume heparin dwells. Dressings were applied. The patient tolerated the procedure well without immediate post procedural complication. IMPRESSION: Status post image guided placement of right IJ temporary hemodialysis catheter. Signed, Dulcy Fanny. Dellia Nims, RPVI [facility]iology [facility] Radiology Electronically Signed   By: Corrie Mckusick D.O.   On: 09/16/2019 14:35   Ir US Guide Vasc Access Right  Result Date: 09/16/2019 INDICATION:  63 year old female with a history of renal failure and leukocytosis EXAM: IMAGE GUIDED PLACEMENT OF TEMPORARY HEMODIALYSIS CATHETER MEDICATIONS: NONE ANESTHESIA/SEDATION: NONE FLUOROSCOPY TIME:  Fluoroscopy Time: 0 minutes 6 seconds (2 mGy). COMPLICATIONS: None PROCEDURE: Informed written consent was obtained from the patient's family after a discussion of the risks, benefits, and alternatives to treatment. Questions regarding the procedure were encouraged and answered. The right neck was prepped with chlorhexidine in a sterile fashion, and a sterile drape was applied covering the operative field. Maximum barrier sterile technique with sterile gowns and gloves were used for the procedure. A timeout was performed prior to the initiation of the procedure. A micropuncture kit was utilized to access the right internal jugular vein under direct, real-time ultrasound guidance after the overlying soft tissues were anesthetized with 1% lidocaine with epinephrine. Ultrasound image documentation was performed. The microwire was kinked to measure appropriate catheter length. A stiff glidewire was advanced to the level of the IVC. A 16 cm hemodialysis catheter was then placed over the wire. Final catheter positioning was confirmed and documented with a spot radiographic image. The catheter aspirates and flushes normally. The catheter was flushed with appropriate volume heparin dwells. Dressings were applied. The patient tolerated the procedure well without immediate post procedural complication. IMPRESSION: Status post image guided placement of right IJ temporary hemodialysis catheter. Signed, Dulcy Fanny. Dellia Nims, RPVI [facility] Inter[facility] Radiology Special[facility] Radiology Electronically Signed   By: Corrie Mckusick D.O.   On: 09/16/2019 14:35   Dg Chest Port 1 View  Result Date: 09/15/2019 CLINICAL DATA:  Reason for  exam: AMS Nurse tech reports patient has bilateral pneumonia. Hx of LBBB, htn, diabetes, cad,  chf. Hx of coronary stent. Quit smoking 2007. EXAM: PORTABLE CHEST 1 VIEW COMPARISON:  Chest radiograph 09/13/2019 FINDINGS: Stable cardiomediastinal contours with enlarged heart size. There are persistent bilateral airspace opacities predominantly in the lower lungs. No pneumothorax or large pleural effusion. No acute finding in the visualized skeleton. IMPRESSION: Persistent predominantly lower lobe airspace opacities bilaterally most likely representing infection. Electronically Signed   By: Audie Pinto M.D.   On: 09/15/2019 13:50   Labs: BMET Recent Labs  Lab 09/10/19 0559 09/11/19 0813 09/12/19 0531 09/13/19 0311 09/14/19 0815 09/15/19 0511 09/16/19 0444  NA 135 132* 132* 132* 132* 132* 133*  K 4.0 4.4 4.3 4.2 4.9 4.6 4.3  CL 100 100 99 98 97* 98 96*  CO2 22 17* 18* 17* 17* 17* 16*  GLUCOSE 170* 104* 75 118* 83 87 94  BUN 53* 64* 77* 88* 96* 102* 104*  CREATININE 4.13* 5.91* 6.90* 7.42* 7.53* 7.30* 6.72*  CALCIUM 7.0* 6.7* 6.6* 6.5* 5.9* 5.9* 6.2*  PHOS  --   --  8.1* 8.2* 9.3* 9.1* 8.3*   CBC Recent Labs  Lab 09/11/19 0813  09/13/19 0311 09/14/19 0815 09/15/19 0511 09/16/19 0444  WBC 18.1*   < > 20.2* 22.7* 21.0* 22.9*  NEUTROABS 12.9*  --   --  18.1* 16.7*  --   HGB 8.8*   < > 9.3* 8.1* 8.4* 8.8*  HCT 27.4*   < > 27.6* 25.1* 25.8* 26.9*  MCV 86.7   < > 84.1 85.4 85.1 84.6  PLT 244   < > 269 343 283 281   < > = values in this interval not displayed.    Medications:    . atorvastatin  80 mg Oral QPM  . calcium acetate  1,334 mg Oral TID WC  . Chlorhexidine Gluconate Cloth  6 each Topical Daily  . Chlorhexidine Gluconate Cloth  6 each Topical Q0600  . heparin      . heparin injection (subcutaneous)  5,000 Units Subcutaneous Q8H  . levothyroxine  125 mcg Oral Q0600  . lidocaine      . multivitamin with minerals  1 tablet Oral Daily  . nystatin   Topical TID  . Ensure Max Protein  11 oz Oral BID  . sodium bicarbonate  650 mg Oral TID   Claudia Desanctis 09/16/2019, 5:52 PM

## 2019-09-16 NOTE — Care Management Important Message (Signed)
Important Message  Patient Details  Name: Maureen Jackson MRN: [id number] Date of Birth: 1956-11-23   Medicare Important Message Given:  Yes     Shelda Altes 09/16/2019, 3:16 PM

## 2019-09-16 NOTE — Progress Notes (Signed)
   Patient Status: [facility] - In-pt  Assessment and Plan: Patient in need of venous access.   Temporary dialysis catheter placement  ______________________________________________________________________   History of Present Illness: Maureen Jackson is a 63 y.o. female   Acute renal injury Likely ATN from sepsis/vancomycin nephrotoxicity with differentials including AIN.   Nephrology recommending initiation of dialysis  But----- Pt with high wbc 22.9 today; 21 yesterday Not on steroid meds Afeb Admitted with Labial infection/abscess--- debridement and washout possibly today  Discussed with Dr Earleen Newport- can offer temporary catheter at this point When wbc trending down to wnl-- can exchange it for  tunneled catheter if needed  Allergies and medications reviewed.   Review of Systems: A 12 point ROS discussed and pertinent positives are indicated in the HPI above.  All other systems are negative.   Vital Signs: BP (!) 108/50 (BP Location: Right Arm)   Pulse 71   Temp 98.7 F (37.1 C) (Oral)   Resp 18   Ht 5\' 5"  (1.651 m)   Wt (!) 361 lb 5.3 oz (163.9 kg)   SpO2 95%   BMI 60.13 kg/m   Physical Exam Vitals signs reviewed.  Cardiovascular:     Rate and Rhythm: Normal rate and regular rhythm.     Heart sounds: Normal heart sounds.  Skin:    General: Skin is warm and dry.  Neurological:     Comments: Asleep-- difficult to arouse Not following commands  Psychiatric:     Comments: Spoke with Maureen Jackson- Daughter via phone Consented for temporary dialysis catheter placment      Imaging reviewed.   Labs:  COAGS: Recent Labs    09/08/19 1607  INR 1.2  APTT 30    BMP: Recent Labs    09/13/19 0311 09/14/19 0815 09/15/19 0511 09/16/19 0444  NA 132* 132* 132* 133*  K 4.2 4.9 4.6 4.3  CL 98 97* 98 96*  CO2 17* 17* 17* 16*  GLUCOSE 118* 83 87 94  BUN 88* 96* 102* 104*  CALCIUM 6.5* 5.9* 5.9* 6.2*  CREATININE 7.42* 7.53* 7.30* 6.72*  GFRNONAA 5* 5* 5* 6*   GFRAA 6* 6* 6* 7*   Scheduled for temporary dialysis catheter placement I have spoken to Dtr Maureen Jackson via phone She is aware of procedure benefits and risks including but not limited to Infection; vessel damage; bleeding Agreeable to proceed Consent signed in chart  Electronically Signed: Lavonia Drafts, PA-C 09/16/2019, 8:15 AM   I spent a total of 15 minutes in face to face in clinical consultation, greater than 50% of which was counseling/coordinating care for venous access/temp dialysis catheter placement

## 2019-09-17 ENCOUNTER — Encounter (HOSPITAL_COMMUNITY): Payer: Self-pay | Admitting: Surgery

## 2019-09-17 DIAGNOSIS — R4182 Altered mental status, unspecified: Secondary | ICD-10-CM

## 2019-09-17 DIAGNOSIS — R21 Rash and other nonspecific skin eruption: Secondary | ICD-10-CM

## 2019-09-17 DIAGNOSIS — L270 Generalized skin eruption due to drugs and medicaments taken internally: Secondary | ICD-10-CM

## 2019-09-17 LAB — CBC WITH DIFFERENTIAL/PLATELET
Abs Immature Granulocytes: 0.82 10*3/uL — ABNORMAL HIGH (ref 0.00–0.07)
Basophils Absolute: 0 10*3/uL (ref 0.0–0.1)
Basophils Relative: 0 %
Eosinophils Absolute: 0.6 10*3/uL — ABNORMAL HIGH (ref 0.0–0.5)
Eosinophils Relative: 3 %
HCT: 25.7 % — ABNORMAL LOW (ref 36.0–46.0)
Hemoglobin: 8.4 g/dL — ABNORMAL LOW (ref 12.0–15.0)
Immature Granulocytes: 3 %
Lymphocytes Relative: 9 %
Lymphs Abs: 2.2 10*3/uL (ref 0.7–4.0)
MCH: 28.3 pg (ref 26.0–34.0)
MCHC: 32.7 g/dL (ref 30.0–36.0)
MCV: 86.5 fL (ref 80.0–100.0)
Monocytes Absolute: 0.8 10*3/uL (ref 0.1–1.0)
Monocytes Relative: 3 %
Neutro Abs: 20.2 10*3/uL — ABNORMAL HIGH (ref 1.7–7.7)
Neutrophils Relative %: 82 %
Platelets: 263 10*3/uL (ref 150–400)
RBC: 2.97 MIL/uL — ABNORMAL LOW (ref 3.87–5.11)
RDW: 15 % (ref 11.5–15.5)
WBC: 24.7 10*3/uL — ABNORMAL HIGH (ref 4.0–10.5)
nRBC: 0 % (ref 0.0–0.2)

## 2019-09-17 LAB — RENAL FUNCTION PANEL
Albumin: 1.4 g/dL — ABNORMAL LOW (ref 3.5–5.0)
Anion gap: 18 — ABNORMAL HIGH (ref 5–15)
BUN: 96 mg/dL — ABNORMAL HIGH (ref 8–23)
CO2: 19 mmol/L — ABNORMAL LOW (ref 22–32)
Calcium: 6.9 mg/dL — ABNORMAL LOW (ref 8.9–10.3)
Chloride: 98 mmol/L (ref 98–111)
Creatinine, Ser: 5.47 mg/dL — ABNORMAL HIGH (ref 0.44–1.00)
GFR calc Af Amer: 9 mL/min — ABNORMAL LOW (ref >60)
GFR calc non Af Amer: 8 mL/min — ABNORMAL LOW (ref >60)
Glucose, Bld: 122 mg/dL — ABNORMAL HIGH (ref 70–99)
Phosphorus: 7 mg/dL — ABNORMAL HIGH (ref 2.5–4.6)
Potassium: 4.3 mmol/L (ref 3.5–5.1)
Sodium: 135 mmol/L (ref 135–145)

## 2019-09-17 LAB — CBC
HCT: 26.6 % — ABNORMAL LOW (ref 36.0–46.0)
Hemoglobin: 8.3 g/dL — ABNORMAL LOW (ref 12.0–15.0)
MCH: 27.3 pg (ref 26.0–34.0)
MCHC: 31.2 g/dL (ref 30.0–36.0)
MCV: 87.5 fL (ref 80.0–100.0)
Platelets: 285 10*3/uL (ref 150–400)
RBC: 3.04 MIL/uL — ABNORMAL LOW (ref 3.87–5.11)
RDW: 14.9 % (ref 11.5–15.5)
WBC: 25.5 10*3/uL — ABNORMAL HIGH (ref 4.0–10.5)
nRBC: 0 % (ref 0.0–0.2)

## 2019-09-17 LAB — GLUCOSE, CAPILLARY
Glucose-Capillary: 98 mg/dL (ref 70–99)
Glucose-Capillary: 126 mg/dL — ABNORMAL HIGH (ref 70–99)
Glucose-Capillary: 155 mg/dL — ABNORMAL HIGH (ref 70–99)
Glucose-Capillary: 129 mg/dL — ABNORMAL HIGH (ref 70–99)

## 2019-09-17 LAB — HEPATITIS B CORE ANTIBODY, TOTAL: Hep B Core Total Ab: NONREACTIVE

## 2019-09-17 LAB — HEPATITIS B SURFACE ANTIGEN: Hepatitis B Surface Ag: NONREACTIVE

## 2019-09-17 MED ORDER — DIPHENHYDRAMINE HCL 25 MG PO CAPS
25.0000 mg | ORAL_CAPSULE | Freq: Three times a day (TID) | ORAL | Status: DC | PRN
  Administered 2019-09-17 – 2019-09-24 (×7): 25 mg via ORAL
  Filled 2019-09-17 (×9): qty 1

## 2019-09-17 MED ORDER — DOXYCYCLINE HYCLATE 100 MG PO TABS
100.0000 mg | ORAL_TABLET | Freq: Two times a day (BID) | ORAL | Status: AC
  Administered 2019-09-17 – 2019-09-26 (×18): 100 mg via ORAL
  Filled 2019-09-17 (×20): qty 1

## 2019-09-17 MED ORDER — RENA-VITE PO TABS
1.0000 | ORAL_TABLET | Freq: Every day | ORAL | Status: DC
  Administered 2019-09-17 – 2019-10-01 (×14): 1 via ORAL
  Filled 2019-09-17 (×14): qty 1

## 2019-09-17 MED ORDER — DIPHENHYDRAMINE-ZINC ACETATE 2-0.1 % EX CREA
TOPICAL_CREAM | Freq: Two times a day (BID) | CUTANEOUS | Status: DC | PRN
  Administered 2019-09-17 (×2): via TOPICAL
  Administered 2019-09-20: 1 via TOPICAL
  Administered 2019-09-20 – 2019-09-25 (×6): via TOPICAL
  Filled 2019-09-17 (×5): qty 28

## 2019-09-17 MED ORDER — BOOST / RESOURCE BREEZE PO LIQD CUSTOM
1.0000 | Freq: Three times a day (TID) | ORAL | Status: DC
  Administered 2019-09-17 – 2019-09-19 (×4): 1 via ORAL

## 2019-09-17 MED ORDER — SODIUM CHLORIDE 0.9 % IV SOLN
510.0000 mg | Freq: Once | INTRAVENOUS | Status: AC
  Administered 2019-09-17: 510 mg via INTRAVENOUS
  Filled 2019-09-17: qty 17

## 2019-09-17 MED ORDER — PRO-STAT SUGAR FREE PO LIQD
30.0000 mL | Freq: Three times a day (TID) | ORAL | Status: DC
  Administered 2019-09-17 – 2019-09-18 (×2): 30 mL via ORAL
  Filled 2019-09-17 (×3): qty 30

## 2019-09-17 MED ORDER — LEVOFLOXACIN 750 MG PO TABS
750.0000 mg | ORAL_TABLET | ORAL | Status: DC
  Administered 2019-09-17: 750 mg via ORAL
  Filled 2019-09-17: qty 1

## 2019-09-17 NOTE — Progress Notes (Signed)
1 Day Post-Op    CC: Weakness, cough, left labial abscess  Subjective: On CPAP when I came in.  She is complaining of a lot of itching generalized this a.m.  Dressing taken down and site looks good see the picture below.  Objective: Vital signs in last 24 hours: Temp:  [97.8 F (36.6 C)-98.7 F (37.1 C)] 98.2 F (36.8 C) (10/07 0840) Pulse Rate:  [68-74] 70 (10/07 0840) Resp:  [16-22] 18 (10/07 0840) BP: (95-136)/(50-70) 132/58 (10/07 0840) SpO2:  [93 %-99 %] 96 % (10/07 0840) Weight:  [163.9 kg] 163.9 kg (10/07 0210) Last BM Date: (pt unable to tell me)  Intake/Output from previous day: 10/06 0701 - 10/07 0700 In: 350 [P.O.:60; I.V.:290] Out: 1075 [Urine:1050; Blood:25] Intake/Output this shift: No intake/output data recorded.  General appearance: alert, cooperative and no distress Site noted below.  There is some blood on the dressing but other than that it looks fairly good.    Lab Results:  Recent Labs    09/15/19 0511 09/16/19 0444  WBC 21.0* 22.9*  HGB 8.4* 8.8*  HCT 25.8* 26.9*  PLT 283 281    BMET Recent Labs    09/16/19 0444 09/17/19 0609  NA 133* 135  K 4.3 4.3  CL 96* 98  CO2 16* 19*  GLUCOSE 94 122*  BUN 104* 96*  CREATININE 6.72* 5.47*  CALCIUM 6.2* 6.9*   PT/INR No results for input(s): LABPROT, INR in the last 72 hours.  Recent Labs  Lab 09/11/19 0813  09/13/19 0311 09/14/19 0815 09/15/19 0511 09/16/19 0444 09/17/19 0609  AST 73*  --   --   --   --   --   --   ALT 42  --   --   --   --   --   --   ALKPHOS 166*  --   --   --   --   --   --   BILITOT 1.0  --   --   --   --   --   --   PROT 6.2*  --   --   --   --   --   --   ALBUMIN 1.6*   < > 1.4* 1.3* 1.5* 1.5* 1.4*   < > = values in this interval not displayed.     Lipase  No results found for: LIPASE   Medications: . atorvastatin  80 mg Oral QPM  . calcium acetate  1,334 mg Oral TID WC  . Chlorhexidine Gluconate Cloth  6 each Topical Daily  . Chlorhexidine Gluconate  Cloth  6 each Topical Q0600  . Chlorhexidine Gluconate Cloth  6 each Topical Q0600  . [START ON 09/22/2019] darbepoetin (ARANESP) injection - NON-DIALYSIS  60 mcg Subcutaneous Q Mon-1800  . heparin injection (subcutaneous)  5,000 Units Subcutaneous Q8H  . levothyroxine  125 mcg Oral Q0600  . multivitamin with minerals  1 tablet Oral Daily  . nystatin   Topical TID  . Ensure Max Protein  11 oz Oral BID  . sodium chloride flush  10-40 mL Intracatheter Q12H    Assessment/Plan Morbid obesity BMI 60.13 DM CAD with cardiac stents in place on plavix Hx ischemic cardiomyopathy Acute on chronic systolic heart failure EF 30 -35% Anemia Multifactorial encephalopathy Hypothyroidism HLD AKI on CKD   - Creatinine 1.88(9/28)>>4.13(9/30)>>7.42(10/3)>>6.72(10/6)   Sepsis with left labial abscess Irrigation and debridement of mons abscess 6 x 6 x 8 cm 09/16/2019 DR. Coralie Keens - CT scan from  9/30 shows extensive inflammatory/infectious process involving the mons pubis, labia, perineum and suprapubic regions with extensive gas formation worrisome for Fournier's gangrene; no discrete drainable soft tissue abscess - Wound is still open and draining. WBC is stable.Continue broad spectrum antibiotics per IDand wound care for now - OR tomorrow 10/06  -Leukocytosis: WBC 21.0(9/28)>> 20.9(10/2)>> 22.9(10/6)  ID: Vancomycin 9/28 x 2 doses; Rocephin 9/28-10/2, azithromycin 9/28-10/2, clindamycin 9/30-10/2. Currently on Zyvox and Unasyn 10/2 >> day 5 VTE -SCDs, sq heparin,last does of plavix 10/02 FEN -IVF,CM, NPO at midnight Foley -in place Follow up -TBD. POC: [facility] Daughter [phone]  [phone]  Price,[name]   [phone]  [name] Sister [phone]    [name]   [phone]    Plan: Wet-to-dry dressings twice daily.       LOS: 9 days    Audon Heymann 09/17/2019 [phone]

## 2019-09-17 NOTE — Progress Notes (Signed)
Tai Tijani, RN called and made aware that the patient's HD tx has been moved to 09/18/19.

## 2019-09-17 NOTE — Progress Notes (Signed)
Pt just returned to the unit from dialysis unit. Alert and responsive. No distress noted at the moment. Will monitor the patient closely

## 2019-09-17 NOTE — Progress Notes (Signed)
Nutrition Follow-up  RD working remotely.  DOCUMENTATION CODES:   Morbid obesity  INTERVENTION:   -D/c MVI with minerals daily -D/c Ensure Max -30 ml Prostat TID with meals, each supplement provies 100 kcals and 15 grams protein -Boost Breeze po TID, each supplement provides 250 kcal and 9 grams of protein -RD will follow for diet advancement and supplement as appropriate  NUTRITION DIAGNOSIS:   Inadequate oral intake related to acute illness(pneumonia; abscess in left labia/upper thigh area) as evidenced by meal completion < 25%.  Ongoing  GOAL:   Patient will meet greater than or equal to 90% of their needs  Progressing   MONITOR:   PO intake, Labs, I & O's, Weight trends, Supplement acceptance, Skin  REASON FOR ASSESSMENT:   Consult Assessment of nutrition requirement/status  ASSESSMENT:   63 year old female with medical history significant for morbid obesity, CHF, hypothyroidism, HTN, OSA, CAD, T2DM, and arthritis who presented to ED with complaints of generalized weakness, severe dry cough, difficulty voiding, poor appetite, and difficulty sleeping for the past 3 days. Patient seen in ED 9/26 for abscess in left labia/upper thigh area that has been draining large amounts of pus; patient discharged on doxycyline and pain medication. Portable CXR showed multifocal pneumonia underlying pulmonary vascular congestion  10/1- transferred from [facility] to [facility] for surgery evaluation 10/2- surgery cancelled 2/2 plavix 10/6- s/p tunnelled catheter placement and HD initiation ; s/p IRRIGATION AND DEBRIDEMENT MONS ABSCESS (6 CM X 6 CM X 8 CM)   Reviewed I/O's: -725 ml x 24 hours and +1.5 L since admission  UOP: 1.1 L x 24 hours  Per chart review, pt more alert today after HD treatment. Plan for HD on 09/19/19 and then will follow for renal recovery per nephrology.   Pt with variable intake, secondary to lethargy. Noted meal completion 0-75%. Pt is currently on a clear liquid  diet.   Labs reviewed: Phos: 7, CBGS: 98-126.   Diet Order:   Diet Order            Diet clear liquid Room service appropriate? Yes; Fluid consistency: Thin  Diet effective now              EDUCATION NEEDS:   Not appropriate for education at this time  Skin:  Skin Assessment: Skin Integrity Issues: Skin Integrity Issues:: Other (Comment) Other: lt labial abscess concerning for Fournier's gangrene  Last BM:  09/17/19  Height:   Ht Readings from Last 1 Encounters:  09/08/19 5\' 5"  (1.651 m)    Weight:   Wt Readings from Last 1 Encounters:  09/17/19 (!) 163.9 kg    Ideal Body Weight:  56.8 kg  BMI:  Body mass index is 60.13 kg/m.  Estimated Nutritional Needs:   Kcal:  1800-2000  Protein:  120-140 grams  Fluid:  > 1.8 L    Vedanshi Massaro A. Jimmye Norman, RD, LDN, [place] Registered Dietitian II Certified Diabetes Care and Education Specialist Pager: [phone] After hours Pager: [phone]

## 2019-09-17 NOTE — Progress Notes (Addendum)
PROGRESS NOTE    Maureen Jackson  VQQ:[id number] DOB: [birth year] DOA: 09/08/2019 PCP: Neale Burly, MD   Brief Narrative: 63 year old with past medical history significant for morbid obesity, systolic heart failure, hypothyroidism, hypertension, CAD and obstructive sleep apnea.  Reportedly generalized weakness difficulty voiding, poor appetite and insomnia for 3 days.  September 26 2 days prior to hospitalization she was seen in the emergency department for left labia upper thigh abscess which was drained.  Patient had worsening renal function in the setting of concern for Fournier gangrene, patient was transferred from [facility] to [facility] for further management.    Assessment & Plan:   Principal Problem:   Fournier's gangrene in female Active Problems:   Hypothyroidism   Essential hypertension   Morbid obesity/BMI > 55   Diabetes mellitus type 2 with complications, uncontrolled (HCC)   CAD, multiple vessel   Ischemic cardiomyopathy   Chronic combined systolic and diastolic CHF (congestive heart failure) (EF 30 to 35 %)   CAP (community acquired pneumonia)   Labial abscess   Abscess   Fistula  1-mons/labial abscess/Fournier gangrene complicated with sepsis/acute metabolic encephalopathy -Patient presented with leukocytosis, fever post sediment in Foley.  Repeat CT scan done on 10/5 findings worrisome for Fournier gangrene: Patient was taken to the OR by general surgery on 10/6. -ID has been consulted and has been helping with antibiotics: -Patient developed rash today, antibiotics has been changed from Zyvox, Unasyn  to Levaquin, and Doxycycline.  Surgery recommending wet-to-dry dressing twice a day  2-Rash; Benadryl cream and oral benadryl PRN.  Unasyn and Zyvox discontinue.  Monitor on CHG baths.  3-AKI;, volume overload non-anion gap metabolic acidosis.  Suspected acute tubular necrosis, oliguric hypocalcemia hyperphosphatemia: Nephrology consulted, patient was a  started on hemodialysis on 10/6  4-acute on chronic systolic heart failure exacerbation, ejection fraction 30 to 35%: Ischemic cardiomyopathy status post DES to LAD and RCA 2018.  Volume control with hemodialysis Holding antiplatelet due to surgical procedure resume when is okay by surgery  5-diabetes: Continue with sliding scale insulin  Morbid obesity with OSA: Continue with CPAP Body mass index 60  Hypothyroidism continue with Synthroid Acute metabolic encephalopathy: Related to acute illness renal failure sepsis infection.  Appears more alert still confused today.     Nutrition Problem: Inadequate oral intake Etiology: acute illness(pneumonia; abscess in left labia/upper thigh area)    Signs/Symptoms: meal completion < 25%    Interventions: Ensure Enlive (each supplement provides 350kcal and 20 grams of protein), MVI  Estimated body mass index is 60.13 kg/m as calculated from the following:   Height as of this encounter: 5' 5"  (1.651 m).   Weight as of this encounter: 163.9 kg.   DVT prophylaxis: Lovenox Code Status: Full code Family Communication: Family at bedside Disposition Plan: We will need PT OT monitor white count Consultants:   surgery  ID  Procedures:   Irrigation and debridement months abscess 6 x 6 by day 10/6  Antimicrobials:  Levaquin and doxy Received Zyvox, Unasyn  Subjective: Patient is alert confused not oriented to place.  She denies pain.  She is complaining of rash in her chest and back itching  Objective: Vitals:   09/17/19 0330 09/17/19 0400 09/17/19 0420 09/17/19 0503  BP: 117/70 (!) 95/50 (!) 108/56 (!) 123/55  Pulse: 72 74 72 70  Resp: 20 20 20 18   Temp:   98.2 F (36.8 C) 98.4 F (36.9 C)  TempSrc:   Axillary Oral  SpO2: 97% 97% 97%  99%  Weight:      Height:        Intake/Output Summary (Last 24 hours) at 09/17/2019 0747 Last data filed at 09/17/2019 0523 Gross per 24 hour  Intake 350 ml  Output 725 ml  Net -375 ml    Filed Weights   09/08/19 1517 09/16/19 0300 09/17/19 0210  Weight: (!) 152 kg (!) 163.9 kg (!) 163.9 kg    Examination:  General exam: Confused morbidly obese Respiratory system: Clear to auscultation. Respiratory effort normal. Cardiovascular system: S1 & S2 heard, RRR. No JVD, murmurs, rubs, gallops or clicks. No pedal edema. Gastrointestinal system: Abdomen is nondistended, soft and nontender. No organomegaly or masses felt. Normal bowel sounds heard. ,  Dressing pelvis area Central nervous system: Alert follows some commands Extremities: Symmetric 5 x 5 power. Skin: Exanthem or rash    Data Reviewed: I have personally reviewed following labs and imaging studies  CBC: Recent Labs  Lab 09/11/19 0813 09/12/19 0531 09/13/19 0311 09/14/19 0815 09/15/19 0511 09/16/19 0444  WBC 18.1* 20.9* 20.2* 22.7* 21.0* 22.9*  NEUTROABS 12.9*  --   --  18.1* 16.7*  --   HGB 8.8* 8.6* 9.3* 8.1* 8.4* 8.8*  HCT 27.4* 26.3* 27.6* 25.1* 25.8* 26.9*  MCV 86.7 85.7 84.1 85.4 85.1 84.6  PLT 244 245 269 343 283 580   Basic Metabolic Panel: Recent Labs  Lab 09/13/19 0311 09/14/19 0815 09/15/19 0511 09/16/19 0444 09/17/19 0609  NA 132* 132* 132* 133* 135  K 4.2 4.9 4.6 4.3 4.3  CL 98 97* 98 96* 98  CO2 17* 17* 17* 16* 19*  GLUCOSE 118* 83 87 94 122*  BUN 88* 96* 102* 104* 96*  CREATININE 7.42* 7.53* 7.30* 6.72* 5.47*  CALCIUM 6.5* 5.9* 5.9* 6.2* 6.9*  PHOS 8.2* 9.3* 9.1* 8.3* 7.0*   GFR: Estimated Creatinine Clearance: 17 mL/min (A) (by C-G formula based on SCr of 5.47 mg/dL (H)). Liver Function Tests: Recent Labs  Lab 09/11/19 0813  09/13/19 0311 09/14/19 0815 09/15/19 0511 09/16/19 0444 09/17/19 0609  AST 73*  --   --   --   --   --   --   ALT 42  --   --   --   --   --   --   ALKPHOS 166*  --   --   --   --   --   --   BILITOT 1.0  --   --   --   --   --   --   PROT 6.2*  --   --   --   --   --   --   ALBUMIN 1.6*   < > 1.4* 1.3* 1.5* 1.5* 1.4*   < > = values in this  interval not displayed.   No results for input(s): LIPASE, AMYLASE in the last 168 hours. No results for input(s): AMMONIA in the last 168 hours. Coagulation Profile: No results for input(s): INR, PROTIME in the last 168 hours. Cardiac Enzymes: Recent Labs  Lab 09/10/19 1054  CKTOTAL 732*   BNP (last 3 results) No results for input(s): PROBNP in the last 8760 hours. HbA1C: No results for input(s): HGBA1C in the last 72 hours. CBG: Recent Labs  Lab 09/16/19 0558 09/16/19 1352 09/16/19 1509 09/16/19 1547 09/16/19 2114  GLUCAP 84 82 85 98 93   Lipid Profile: No results for input(s): CHOL, HDL, LDLCALC, TRIG, CHOLHDL, LDLDIRECT in the last 72 hours. Thyroid Function Tests: No results  for input(s): TSH, T4TOTAL, FREET4, T3FREE, THYROIDAB in the last 72 hours. Anemia Panel: Recent Labs    09/15/19 1636  FERRITIN 390*  TIBC 178*  IRON 37   Sepsis Labs: Recent Labs  Lab 09/15/19 0917  LATICACIDVEN 0.9    Recent Results (from the past 240 hour(s))  Urine culture     Status: Abnormal   Collection Time: 09/08/19  3:58 PM   Specimen: In/Out Cath Urine  Result Value Ref Range Status   Specimen Description   Final    IN/OUT CATH URINE Performed at [facility], [address], [place], [place] [address]    Special Requests   Final    NONE Performed at [facility], [address], [place],  [address]    Culture >=100,000 COLONIES/mL YEAST (A)  Final   Report Status 09/10/2019 FINAL  Final  SARS Coronavirus 2 [facility] order, Performed in [facility] hospital lab) Nasopharyngeal Nasopharyngeal Swab     Status: None   Collection Time: 09/08/19  3:59 PM   Specimen: Nasopharyngeal Swab  Result Value Ref Range Status   SARS Coronavirus 2 NEGATIVE NEGATIVE Final    Comment: (NOTE) If result is NEGATIVE SARS-CoV-2 target nucleic acids are NOT DETECTED. The SARS-CoV-2 RNA is generally detectable in upper and lower  respiratory specimens during the acute phase of  infection. The lowest  concentration of SARS-CoV-2 viral copies this assay can detect is 250  copies / mL. A negative result does not preclude SARS-CoV-2 infection  and should not be used as the sole basis for treatment or other  patient management decisions.  A negative result may occur with  improper specimen collection / handling, submission of specimen other  than nasopharyngeal swab, presence of viral mutation(s) within the  areas targeted by this assay, and inadequate number of viral copies  (<250 copies / mL). A negative result must be combined with clinical  observations, patient history, and epidemiological information. If result is POSITIVE SARS-CoV-2 target nucleic acids are DETECTED. The SARS-CoV-2 RNA is generally detectable in upper and lower  respiratory specimens dur ing the acute phase of infection.  Positive  results are indicative of active infection with SARS-CoV-2.  Clinical  correlation with patient history and other diagnostic information is  necessary to determine patient infection status.  Positive results do  not rule out bacterial infection or co-infection with other viruses. If result is PRESUMPTIVE POSTIVE SARS-CoV-2 nucleic acids MAY BE PRESENT.   A presumptive positive result was obtained on the submitted specimen  and confirmed on repeat testing.  While 2019 novel coronavirus  (SARS-CoV-2) nucleic acids may be present in the submitted sample  additional confirmatory testing may be necessary for epidemiological  and / or clinical management purposes  to differentiate between  SARS-CoV-2 and other Sarbecovirus currently known to infect humans.  If clinically indicated additional testing with an alternate test  methodology [phone]) is advised. The SARS-CoV-2 RNA is generally  detectable in upper and lower respiratory sp ecimens during the acute  phase of infection. The expected result is Negative. Fact Sheet for Patients:   StrictlyIdeas.no Fact Sheet for Healthcare Providers: BankingDealers.co.za This test is not yet approved or cleared by the [place] FDA and has been authorized for detection and/or diagnosis of SARS-CoV-2 by FDA under an Emergency Use Authorization (EUA).  This EUA will remain in effect (meaning this test can be used) for the duration of the COVID-19 declaration under Section 564(b)(1) of the Act, 21 U.S.C. section 360bbb-3(b)(1), unless  the authorization is terminated or revoked sooner. Performed at [facility], [address], [place], [place] [address]   Blood Culture (routine x 2)     Status: None   Collection Time: 09/08/19  4:07 PM   Specimen: BLOOD RIGHT WRIST  Result Value Ref Range Status   Specimen Description BLOOD RIGHT WRIST  Final   Special Requests   Final    BOTTLES DRAWN AEROBIC AND ANAEROBIC Blood Culture adequate volume   Culture   Final    NO GROWTH 5 DAYS Performed at [facility], [address] [place], [place] [address]    Report Status 09/13/2019 FINAL  Final  Blood Culture (routine x 2)     Status: None   Collection Time: 09/08/19  4:17 PM   Specimen: BLOOD LEFT HAND  Result Value Ref Range Status   Specimen Description BLOOD LEFT HAND  Final   Special Requests   Final    BOTTLES DRAWN AEROBIC AND ANAEROBIC Blood Culture adequate volume   Culture   Final    NO GROWTH 5 DAYS Performed at [facility], [address], [place],  [address]    Report Status 09/13/2019 FINAL  Final         Radiology Studies: Ct Pelvis Wo Contrast  Result Date: 09/16/2019 CLINICAL DATA:  Groin and labial abscess EXAM: CT PELVIS WITHOUT CONTRAST TECHNIQUE: Multidetector CT imaging of the pelvis was performed following the standard protocol without intravenous contrast. COMPARISON:  CT 09/10/2019 FINDINGS: Urinary Tract: Large habitus results in overall grainy appearance of images and limits the exam. Foley catheter  in the bladder. Bowel:  Unremarkable visualized pelvic bowel loops. Vascular/Lymphatic: Enlarged left inguinal lymph nodes up to 12 mm. Reproductive: Status post hysterectomy. No adnexal mass. Persistent gas and inflammatory change within the mons pubis with small gas in soft tissue thickening tracking to the left labia. Suspected packing material at the left labia. No appreciable focal fluid collection though no intravenous contrast given. Other:  No pelvic effusion Musculoskeletal: No acute osseous abnormality IMPRESSION: 1. Persistent soft tissue gas and inflammatory change at the mons pubis, raising concern for necrotizing infection. Slight decreased gas collection at the left labia with what appears to be packing material. No definitive focal fluid collection at the left labia to suggest residual abscess however examination limited by habitus and absence of intravenous contrast. There is ongoing edema and inflammatory change, compatible with soft tissue infection. 2. Mild left inguinal adenopathy, likely reactive Electronically Signed   By: Donavan Foil M.D.   On: 09/16/2019 00:17   Ir Fluoro Guide Cv Line Right  Result Date: 09/16/2019 INDICATION: 63 year old female with a history of renal failure and leukocytosis EXAM: IMAGE GUIDED PLACEMENT OF TEMPORARY HEMODIALYSIS CATHETER MEDICATIONS: NONE ANESTHESIA/SEDATION: NONE FLUOROSCOPY TIME:  Fluoroscopy Time: 0 minutes 6 seconds (2 mGy). COMPLICATIONS: None PROCEDURE: Informed written consent was obtained from the patient's family after a discussion of the risks, benefits, and alternatives to treatment. Questions regarding the procedure were encouraged and answered. The right neck was prepped with chlorhexidine in a sterile fashion, and a sterile drape was applied covering the operative field. Maximum barrier sterile technique with sterile gowns and gloves were used for the procedure. A timeout was performed prior to the initiation of the procedure. A  micropuncture kit was utilized to access the right internal jugular vein under direct, real-time ultrasound guidance after the overlying soft tissues were anesthetized with 1% lidocaine with epinephrine. Ultrasound image documentation was performed. The microwire was kinked  to measure appropriate catheter length. A stiff glidewire was advanced to the level of the IVC. A 16 cm hemodialysis catheter was then placed over the wire. Final catheter positioning was confirmed and documented with a spot radiographic image. The catheter aspirates and flushes normally. The catheter was flushed with appropriate volume heparin dwells. Dressings were applied. The patient tolerated the procedure well without immediate post procedural complication. IMPRESSION: Status post image guided placement of right IJ temporary hemodialysis catheter. Signed, Dulcy Fanny. Dellia Nims, RPVI [facility] Radiology Electronically Signed   By: Corrie Mckusick D.O.   On: 09/16/2019 14:35   Ir US Guide Vasc Access Right  Result Date: 09/16/2019 INDICATION: 63 year old female with a history of renal failure and leukocytosis EXAM: IMAGE GUIDED PLACEMENT OF TEMPORARY HEMODIALYSIS CATHETER MEDICATIONS: NONE ANESTHESIA/SEDATION: NONE FLUOROSCOPY TIME:  Fluoroscopy Time: 0 minutes 6 seconds (2 mGy). COMPLICATIONS: None PROCEDURE: Informed written consent was obtained from the patient's family after a discussion of the risks, benefits, and alternatives to treatment. Questions regarding the procedure were encouraged and answered. The right neck was prepped with chlorhexidine in a sterile fashion, and a sterile drape was applied covering the operative field. Maximum barrier sterile technique with sterile gowns and gloves were used for the procedure. A timeout was performed prior to the initiation of the procedure. A micropuncture kit was utilized to access the right internal jugular vein under direct, real-time  ultrasound guidance after the overlying soft tissues were anesthetized with 1% lidocaine with epinephrine. Ultrasound image documentation was performed. The microwire was kinked to measure appropriate catheter length. A stiff glidewire was advanced to the level of the IVC. A 16 cm hemodialysis catheter was then placed over the wire. Final catheter positioning was confirmed and documented with a spot radiographic image. The catheter aspirates and flushes normally. The catheter was flushed with appropriate volume heparin dwells. Dressings were applied. The patient tolerated the procedure well without immediate post procedural complication. IMPRESSION: Status post image guided placement of right IJ temporary hemodialysis catheter. Signed, Dulcy Fanny. Dellia Nims, RPVI [facility] Radiology Electronically Signed   By: Corrie Mckusick D.O.   On: 09/16/2019 14:35   Dg Chest Port 1 View  Result Date: 09/15/2019 CLINICAL DATA:  Reason for exam: AMS Nurse tech reports patient has bilateral pneumonia. Hx of LBBB, htn, diabetes, cad, chf. Hx of coronary stent. Quit smoking 2007. EXAM: PORTABLE CHEST 1 VIEW COMPARISON:  Chest radiograph 09/13/2019 FINDINGS: Stable cardiomediastinal contours with enlarged heart size. There are persistent bilateral airspace opacities predominantly in the lower lungs. No pneumothorax or large pleural effusion. No acute finding in the visualized skeleton. IMPRESSION: Persistent predominantly lower lobe airspace opacities bilaterally most likely representing infection. Electronically Signed   By: Audie Pinto M.D.   On: 09/15/2019 13:50        Scheduled Meds: . atorvastatin  80 mg Oral QPM  . calcium acetate  1,334 mg Oral TID WC  . Chlorhexidine Gluconate Cloth  6 each Topical Daily  . Chlorhexidine Gluconate Cloth  6 each Topical Q0600  . Chlorhexidine Gluconate Cloth  6 each Topical Q0600  . [START ON 09/22/2019] darbepoetin  (ARANESP) injection - NON-DIALYSIS  60 mcg Subcutaneous Q Mon-1800  . heparin injection (subcutaneous)  5,000 Units Subcutaneous Q8H  . levothyroxine  125 mcg Oral Q0600  . multivitamin with minerals  1 tablet Oral Daily  . nystatin   Topical TID  . Ensure Max Protein  11 oz Oral  BID  . sodium chloride flush  10-40 mL Intracatheter Q12H   Continuous Infusions: . ampicillin-sulbactam (UNASYN) IV 3 g (09/17/19 0456)  . linezolid (ZYVOX) IV 600 mg (09/17/19 0536)  . sodium chloride       LOS: 9 days    Time spent: 35 minutes.     Elmarie Shiley, MD [facility] Pager [phone]  If 7PM-7AM, please contact night-coverage [email or web address] Password [facility] 09/17/2019, 7:47 AM

## 2019-09-17 NOTE — Evaluation (Signed)
Physical Therapy Evaluation Patient Details Name: Maureen Jackson MRN: [id number] DOB: 07-17-1956 Today's Date: 09/17/2019   History of Present Illness  63 year old with past medical history significant for morbid obesity, systolic heart failure, hypothyroidism, hypertension, CAD and obstructive sleep apnea. To AP H 09/08/19 weakness, difficulty voiding, poor appetite and insomnia for 3 days. S/P I and D in ED 9/25 for r left labia upper thigh abscess .  Patient had worsening renal function in the setting of concern for Fournier gangrene, patient was transferred to [facility] for further management. S/P R nontineelled HD catheter, S/P I and D labia abcess 10/6  Clinical Impression  The patient complains of itching due to rash. Patient presents with significant limitations in mobility, Pain in right leg and medial malleolus. Patient was independent ambulator PTA. Pt admitted with above diagnosis.  Pt currently with functional limitations due to the deficits listed below (see PT Problem List). Pt will benefit from skilled PT to increase their independence and safety with mobility to allow discharge to the venue listed below.       Follow Up Recommendations SNF    Equipment Recommendations  Wheelchair (measurements PT)    Recommendations for Other Services       Precautions / Restrictions Precautions Precaution Comments: R IJ HD catheter, abcess periarea, pAINFUL RIGHT LEG      Mobility  Bed Mobility               General bed mobility comments: had pt reach across  body for  right rail for prep to roll, body habitus/edema limiting  Transfers                 General transfer comment: will need maximove  Ambulation/Gait                Stairs            Wheelchair Mobility    Modified Rankin (Stroke Patients Only)       Balance                                             Pertinent Vitals/Pain Pain Assessment: Faces Faces Pain  Scale: Hurts even more Pain Location: Back of right knee when knee extended, right medial malleolus, noted slightly reddened, generalized significant itching- noted rash on trunk, bacl , especially left side Pain Descriptors / Indicators: Discomfort;Grimacing;Guarding;Jabbing;Moaning Pain Intervention(s): Limited activity within patient's tolerance;Monitored during session    Home Living Family/patient expects to be discharged to:: Private residence Living Arrangements: Children Available Help at Discharge: Friend(s);Family   Home Access: Level entry     Home Layout: One level   Additional Comments: pt states no DME, last encounter states  has RW. will check again prior to DC if necessary    Prior Function Level of Independence: Independent         Comments: per daughter, pt was able to ambualte on day of adm.     Hand Dominance        Extremity/Trunk Assessment   Upper Extremity Assessment Upper Extremity Assessment: Defer to OT evaluation    Lower Extremity Assessment Lower Extremity Assessment: Generalized weakness;RLE deficits/detail;LLE deficits/detail RLE Deficits / Details: barely dorsi/plantarflexes, too painful. does int. rotate leg about 50% , knee ext 2/5 and limited to pain RLE: Unable to fully assess due to pain LLE Deficits / Details: knee extension  3, adorsi/plantar flex 2+, int rotation through 75% range. not painful    Cervical / Trunk Assessment Cervical / Trunk Assessment: Other exceptions Cervical / Trunk Exceptions: requires max assist and pulling  with both UE's on rails  and bed tilted down for gravity assisted sitting forward in bed/chair position. can hold on  a few secs. Linmited pulling on R due to IJ catheter.. Body habitus limits trunk flexion  Communication   Communication: No difficulties  Cognition Arousal/Alertness: Awake/alert Behavior During Therapy: WFL for tasks assessed/performed;Flat affect Overall Cognitive Status:  Impaired/Different from baseline Area of Impairment: Orientation;Attention;Following commands;Awareness                 Orientation Level: Time;Situation Current Attention Level: Selective   Following Commands: Follows one step commands inconsistently   Awareness: Emergent   General Comments: requires redirection when lapses attention. focussed on itching. daughter present and states pt. is more alert and oriented.      General Comments      Exercises General Exercises - Lower Extremity Ankle Circles/Pumps: AAROM;Both;10 reps;Supine Short Arc Quad: AAROM;AROM;Both;10 reps;Seated Heel Slides: AAROM;Both;10 reps;Supine Hip ABduction/ADduction: AAROM;Both;10 reps;Supine   Assessment/Plan    PT Assessment Patient needs continued PT services  PT Problem List Decreased strength;Decreased mobility;Decreased range of motion;Decreased knowledge of precautions;Obesity;Decreased activity tolerance;Decreased cognition;Decreased balance;Decreased knowledge of use of DME;Pain       PT Treatment Interventions Therapeutic activities;Therapeutic exercise;Balance training;Functional mobility training;Patient/family education    PT Goals (Current goals can be found in the Care Plan section)  Acute Rehab PT Goals Patient Stated Goal: to go home, not itch PT Goal Formulation: With patient/family Time For Goal Achievement: 10/01/19 Potential to Achieve Goals: Fair    Frequency Min 2X/week   Barriers to discharge        Co-evaluation               AM-PAC PT "6 Clicks" Mobility  Outcome Measure Help needed turning from your back to your side while in a flat bed without using bedrails?: Total Help needed moving from lying on your back to sitting on the side of a flat bed without using bedrails?: Total Help needed moving to and from a bed to a chair (including a wheelchair)?: Total Help needed standing up from a chair using your arms (e.g., wheelchair or bedside chair)?:  Total Help needed to walk in hospital room?: Total Help needed climbing 3-5 steps with a railing? : Total 6 Click Score: 6    End of Session   Activity Tolerance: Patient limited by fatigue;Patient limited by pain Patient left: in bed;with bed alarm set;with call bell/phone within reach;with nursing/sitter in room;with family/visitor present Nurse Communication: Mobility status;Need for lift equipment PT Visit Diagnosis: Unsteadiness on feet (R26.81);Muscle weakness (generalized) (M62.81);Pain Pain - Right/Left: Right Pain - part of body: Leg    Time: 5027-7412 PT Time Calculation (min) (ACUTE ONLY): 38 min   Charges:   PT Evaluation $PT Eval Moderate Complexity: 1 Mod PT Treatments $Therapeutic Exercise: 8-22 mins $Therapeutic Activity: 8-22 mins $Therapeutic Activity Peds: 23-37 mins        Tresa Endo PT Acute Rehabilitation Services Pager [phone] Office [phone]   Claretha Cooper 09/17/2019, 4:49 PM

## 2019-09-17 NOTE — Progress Notes (Signed)
Patient ID: Maureen Jackson, female   DOB: 06/12/1956, 63 y.o.   MRN: [id number] [facility] Progress Note   Assessment/ Plan:   1. Acute kidney Injury: Likely ATN from sepsis/vancomycin nephrotoxicity with differentials including AIN.  Low index of suspicion that this is an acute GN based on initial urinalysis/urine microscopy findings.  Had nontunneled catheter (leukocytosis) on 10/6 and initiation of HD later that night for clearance - Plan for HD on 10/7 as well.  Will then perform HD on 10/9 for the following treatment unless needed acutely  - Continue to monitor for recovery - nonoliguric    2. Encephalopathy -multifactorial with AKI as well as OSA, narcotics  3.  Anion gap metabolic acidosis: Secondary to acute kidney injury/SIRS from infection.  Stopped bicarb and on HD   4.  Left labial abscess versus Fournier's gangrene: Earlier CT scan suggestive of Fournier's gangrene prompting transfer to [facility] for possible surgical intervention.  On examination found to be having local labial abscess with drainage-ongoing intravenous antibiotics with persistent leukocytosis with wound care/antibiotic treatment; s/p I&D on 10/6    5.  Anemia: Secondary to acute/critical illness, no overt loss.  Continue to monitor trend. aranesp started on 10/5.  Feraheme x 1 on 10/7  6.  Hyperphosphatemia: Secondary to acute kidney injury and has been on calcium acetate.  Improving with HD as well     Subjective:   She had a nontunneled dialysis catheter placed on 10/6 and had HD overnight.  She had 1.1 liters UOP over 10/6.  She feels a little less confused right now.    Review of systems:  Denies shortness of breath or chest pain Urinating via foley  Denies n/v   Objective:   BP (!) 132/58 (BP Location: Right Arm)   Pulse 70   Temp 98.2 F (36.8 C) (Oral)   Resp 18   Ht 5' 5"  (1.651 m)   Wt (!) 163.9 kg   SpO2 96%   BMI 60.13 kg/m   Intake/Output Summary (Last 24 hours) at  09/17/2019 1115 Last data filed at 09/17/2019 0945 Gross per 24 hour  Intake 560 ml  Output 725 ml  Net -165 ml   Weight change: 0 kg  Physical Exam:  Gen: Morbidly obese female. Awake on entry to room  CVS: RRR no rub  Resp: decreased breath sounds; unlabored at rest  Abd: Soft, obese, nontender, bowel sounds normal Ext:  trace edema lower extremities  Neuro - states year is 2014 then 2020; oriented to person and location of [facility] has a foley  Access RIJ nontunneled dialysis catheter  Imaging: Ct Pelvis Wo Contrast  Result Date: 09/16/2019 CLINICAL DATA:  Groin and labial abscess EXAM: CT PELVIS WITHOUT CONTRAST TECHNIQUE: Multidetector CT imaging of the pelvis was performed following the standard protocol without intravenous contrast. COMPARISON:  CT 09/10/2019 FINDINGS: Urinary Tract: Large habitus results in overall grainy appearance of images and limits the exam. Foley catheter in the bladder. Bowel:  Unremarkable visualized pelvic bowel loops. Vascular/Lymphatic: Enlarged left inguinal lymph nodes up to 12 mm. Reproductive: Status post hysterectomy. No adnexal mass. Persistent gas and inflammatory change within the mons pubis with small gas in soft tissue thickening tracking to the left labia. Suspected packing material at the left labia. No appreciable focal fluid collection though no intravenous contrast given. Other:  No pelvic effusion Musculoskeletal: No acute osseous abnormality IMPRESSION: 1. Persistent soft tissue gas and inflammatory change at the mons pubis, raising concern for  necrotizing infection. Slight decreased gas collection at the left labia with what appears to be packing material. No definitive focal fluid collection at the left labia to suggest residual abscess however examination limited by habitus and absence of intravenous contrast. There is ongoing edema and inflammatory change, compatible with soft tissue infection. 2. Mild left inguinal adenopathy, likely  reactive Electronically Signed   By: Donavan Foil M.D.   On: 09/16/2019 00:17   Ir Fluoro Guide Cv Line Right  Result Date: 09/16/2019 INDICATION: 63 year old female with a history of renal failure and leukocytosis EXAM: IMAGE GUIDED PLACEMENT OF TEMPORARY HEMODIALYSIS CATHETER MEDICATIONS: NONE ANESTHESIA/SEDATION: NONE FLUOROSCOPY TIME:  Fluoroscopy Time: 0 minutes 6 seconds (2 mGy). COMPLICATIONS: None PROCEDURE: Informed written consent was obtained from the patient's family after a discussion of the risks, benefits, and alternatives to treatment. Questions regarding the procedure were encouraged and answered. The right neck was prepped with chlorhexidine in a sterile fashion, and a sterile drape was applied covering the operative field. Maximum barrier sterile technique with sterile gowns and gloves were used for the procedure. A timeout was performed prior to the initiation of the procedure. A micropuncture kit was utilized to access the right internal jugular vein under direct, real-time ultrasound guidance after the overlying soft tissues were anesthetized with 1% lidocaine with epinephrine. Ultrasound image documentation was performed. The microwire was kinked to measure appropriate catheter length. A stiff glidewire was advanced to the level of the IVC. A 16 cm hemodialysis catheter was then placed over the wire. Final catheter positioning was confirmed and documented with a spot radiographic image. The catheter aspirates and flushes normally. The catheter was flushed with appropriate volume heparin dwells. Dressings were applied. The patient tolerated the procedure well without immediate post procedural complication. IMPRESSION: Status post image guided placement of right IJ temporary hemodialysis catheter. Signed, Dulcy Fanny. Dellia Nims, RPVI [facility] Radiology Electronically Signed   By: Corrie Mckusick D.O.   On: 09/16/2019 14:35   Ir US Guide Vasc  Access Right  Result Date: 09/16/2019 INDICATION: 63 year old female with a history of renal failure and leukocytosis EXAM: IMAGE GUIDED PLACEMENT OF TEMPORARY HEMODIALYSIS CATHETER MEDICATIONS: NONE ANESTHESIA/SEDATION: NONE FLUOROSCOPY TIME:  Fluoroscopy Time: 0 minutes 6 seconds (2 mGy). COMPLICATIONS: None PROCEDURE: Informed written consent was obtained from the patient's family after a discussion of the risks, benefits, and alternatives to treatment. Questions regarding the procedure were encouraged and answered. The right neck was prepped with chlorhexidine in a sterile fashion, and a sterile drape was applied covering the operative field. Maximum barrier sterile technique with sterile gowns and gloves were used for the procedure. A timeout was performed prior to the initiation of the procedure. A micropuncture kit was utilized to access the right internal jugular vein under direct, real-time ultrasound guidance after the overlying soft tissues were anesthetized with 1% lidocaine with epinephrine. Ultrasound image documentation was performed. The microwire was kinked to measure appropriate catheter length. A stiff glidewire was advanced to the level of the IVC. A 16 cm hemodialysis catheter was then placed over the wire. Final catheter positioning was confirmed and documented with a spot radiographic image. The catheter aspirates and flushes normally. The catheter was flushed with appropriate volume heparin dwells. Dressings were applied. The patient tolerated the procedure well without immediate post procedural complication. IMPRESSION: Status post image guided placement of right IJ temporary hemodialysis catheter. Signed, Dulcy Fanny. Dellia Nims, Susan [facility] Radiology Electronically Signed  By: Corrie Mckusick D.O.   On: 09/16/2019 14:35   Dg Chest Port 1 View  Result Date: 09/15/2019 CLINICAL DATA:  Reason for exam: AMS Nurse tech reports patient has  bilateral pneumonia. Hx of LBBB, htn, diabetes, cad, chf. Hx of coronary stent. Quit smoking 2007. EXAM: PORTABLE CHEST 1 VIEW COMPARISON:  Chest radiograph 09/13/2019 FINDINGS: Stable cardiomediastinal contours with enlarged heart size. There are persistent bilateral airspace opacities predominantly in the lower lungs. No pneumothorax or large pleural effusion. No acute finding in the visualized skeleton. IMPRESSION: Persistent predominantly lower lobe airspace opacities bilaterally most likely representing infection. Electronically Signed   By: Audie Pinto M.D.   On: 09/15/2019 13:50   Labs: BMET Recent Labs  Lab 09/11/19 0813 09/12/19 0531 09/13/19 0311 09/14/19 0815 09/15/19 0511 09/16/19 0444 09/17/19 0609  NA 132* 132* 132* 132* 132* 133* 135  K 4.4 4.3 4.2 4.9 4.6 4.3 4.3  CL 100 99 98 97* 98 96* 98  CO2 17* 18* 17* 17* 17* 16* 19*  GLUCOSE 104* 75 118* 83 87 94 122*  BUN 64* 77* 88* 96* 102* 104* 96*  CREATININE 5.91* 6.90* 7.42* 7.53* 7.30* 6.72* 5.47*  CALCIUM 6.7* 6.6* 6.5* 5.9* 5.9* 6.2* 6.9*  PHOS  --  8.1* 8.2* 9.3* 9.1* 8.3* 7.0*   CBC Recent Labs  Lab 09/11/19 0813  09/14/19 0815 09/15/19 0511 09/16/19 0444 09/17/19 0947  WBC 18.1*   < > 22.7* 21.0* 22.9* 25.5*  NEUTROABS 12.9*  --  18.1* 16.7*  --   --   HGB 8.8*   < > 8.1* 8.4* 8.8* 8.3*  HCT 27.4*   < > 25.1* 25.8* 26.9* 26.6*  MCV 86.7   < > 85.4 85.1 84.6 87.5  PLT 244   < > 343 283 281 285   < > = values in this interval not displayed.    Medications:    . atorvastatin  80 mg Oral QPM  . calcium acetate  1,334 mg Oral TID WC  . Chlorhexidine Gluconate Cloth  6 each Topical Daily  . Chlorhexidine Gluconate Cloth  6 each Topical Q0600  . Chlorhexidine Gluconate Cloth  6 each Topical Q0600  . [START ON 09/22/2019] darbepoetin (ARANESP) injection - NON-DIALYSIS  60 mcg Subcutaneous Q Mon-1800  . doxycycline  100 mg Oral Q12H  . heparin injection (subcutaneous)  5,000 Units Subcutaneous Q8H  .  levofloxacin  750 mg Oral Q48H  . levothyroxine  125 mcg Oral Q0600  . multivitamin with minerals  1 tablet Oral Daily  . nystatin   Topical TID  . Ensure Max Protein  11 oz Oral BID  . sodium chloride flush  10-40 mL Intracatheter Q12H   Claudia Desanctis 09/17/2019, 11:15 AM

## 2019-09-17 NOTE — Progress Notes (Addendum)
Subjective:  Very itchy all over. She has a new rash that has presented on arms and stomach recently.  Underwent dialysis treatment yesterday and more awake today.   Other interval additions -  WBC increased o/n 25.5K, Afebrile  Antibiotics:  Anti-infectives (From admission, onward)   Start     Dose/Rate Route Frequency Ordered Stop   09/16/19 1600  linezolid (ZYVOX) IVPB 600 mg     600 mg 300 mL/hr over 60 Minutes Intravenous Every 12 hours 09/16/19 1556     09/12/19 1100  linezolid (ZYVOX) IVPB 600 mg  Status:  Discontinued     600 mg 300 mL/hr over 60 Minutes Intravenous Every 12 hours 09/12/19 0945 09/16/19 1556   09/12/19 1100  Ampicillin-Sulbactam (UNASYN) 3 g in sodium chloride 0.9 % 100 mL IVPB     3 g 200 mL/hr over 30 Minutes Intravenous Every 12 hours 09/12/19 0945     09/10/19 1430  clindamycin (CLEOCIN) IVPB 600 mg  Status:  Discontinued     600 mg 100 mL/hr over 30 Minutes Intravenous Every 6 hours 09/10/19 1410 09/12/19 0945   09/10/19 0742  vancomycin variable dose per unstable renal function (pharmacist dosing)  Status:  Discontinued      Does not apply See admin instructions 09/10/19 0742 09/10/19 1413   09/10/19 0000  vancomycin (VANCOCIN) 1,750 mg in sodium chloride 0.9 % 500 mL IVPB  Status:  Discontinued     1,750 mg 250 mL/hr over 120 Minutes Intravenous Every 24 hours 09/09/19 0912 09/09/19 0928   09/10/19 0000  vancomycin (VANCOCIN) 1,750 mg in sodium chloride 0.9 % 500 mL IVPB  Status:  Discontinued     1,750 mg 250 mL/hr over 120 Minutes Intravenous Every 24 hours 09/09/19 0940 09/10/19 0751   09/09/19 1030  vancomycin (VANCOCIN) IVPB 750 mg/150 ml premix  Status:  Discontinued     750 mg 150 mL/hr over 60 Minutes Intravenous  Once 09/09/19 0928 09/09/19 0938   09/09/19 0930  vancomycin (VANCOCIN) IVPB 1000 mg/200 mL premix  Status:  Discontinued     1,000 mg 200 mL/hr over 60 Minutes Intravenous  Once 09/09/19 0928 09/09/19 0938   09/08/19 2300  azithromycin (ZITHROMAX) 500 mg in sodium chloride 0.9 % 250 mL IVPB  Status:  Discontinued     500 mg 250 mL/hr over 60 Minutes Intravenous Every 24 hours 09/08/19 2230 09/12/19 0945   09/08/19 2300  cefTRIAXone (ROCEPHIN) 2 g in sodium chloride 0.9 % 100 mL IVPB  Status:  Discontinued     2 g 200 mL/hr over 30 Minutes Intravenous Every 24 hours 09/08/19 2230 09/12/19 0945   09/08/19 2300  vancomycin (VANCOCIN) 1,500 mg in sodium chloride 0.9 % 500 mL IVPB     1,500 mg 250 mL/hr over 120 Minutes Intravenous  Once 09/08/19 2254 09/09/19 0434   09/08/19 1600  vancomycin (VANCOCIN) IVPB 1000 mg/200 mL premix     1,000 mg 200 mL/hr over 60 Minutes Intravenous  Once 09/08/19 1558 09/08/19 1929   09/08/19 1600  piperacillin-tazobactam (ZOSYN) IVPB 3.375 g     3.375 g 100 mL/hr over 30 Minutes Intravenous  Once 09/08/19 1558 09/08/19 1655      Medications: Scheduled Meds: . atorvastatin  80 mg Oral QPM  . calcium acetate  1,334 mg Oral TID WC  . Chlorhexidine Gluconate Cloth  6 each Topical Daily  . Chlorhexidine Gluconate Cloth  6 each Topical Q0600  . Chlorhexidine Gluconate Cloth  6 each Topical Q0600  . [START ON 09/22/2019] darbepoetin (ARANESP) injection - NON-DIALYSIS  60 mcg Subcutaneous Q Mon-1800  . heparin injection (subcutaneous)  5,000 Units Subcutaneous Q8H  . levothyroxine  125 mcg Oral Q0600  . multivitamin with minerals  1 tablet Oral Daily  . nystatin   Topical TID  . Ensure Max Protein  11 oz Oral BID  . sodium chloride flush  10-40 mL Intracatheter Q12H   Continuous Infusions: . ampicillin-sulbactam (UNASYN) IV 3 g (09/17/19 0456)  . linezolid (ZYVOX) IV 600 mg (09/17/19 0536)  . sodium chloride     PRN Meds:.acetaminophen, diphenhydrAMINE, diphenhydrAMINE-zinc acetate, heparin, ipratropium-albuterol, lidocaine, naloxone, sodium chloride flush    Objective: Weight change: 0 kg  Intake/Output Summary (Last 24 hours) at 09/17/2019 1059 Last data  filed at 09/17/2019 0945 Gross per 24 hour  Intake 560 ml  Output 725 ml  Net -165 ml   Blood pressure (!) 132/58, pulse 70, temperature 98.2 F (36.8 C), temperature source Oral, resp. rate 18, height _0  (1.651 m), weight (!) 163.9 kg, SpO2 96 %. Temp:  [97.8 F (36.6 C)-98.7 F (37.1 C)] 98.2 F (36.8 C) (10/07 0840) Pulse Rate:  [68-74] 70 (10/07 0840) Resp:  [16-22] 18 (10/07 0840) BP: (95-136)/(50-70) 132/58 (10/07 0840) SpO2:  [93 %-99 %] 96 % (10/07 0840) Weight:  [163.9 kg] 163.9 kg (10/07 0210)  Physical Exam: General: awake, alert and able to communicate needs. Resting in bed itching skin  CVS regular rate, normal  Chest: , no wheezing, no respiratory distress Abdomen: soft non-distended,  Mons Pubis: pictures reviewed from chart today and wound bed overall clean but open.  Integument: erythematous, pruritic urticarial rash widespread over patient's torso, abdomen, chest, under arms. Unable to visualize back. Legs seem spared.  Neuro: nonfocal  CBC: Lab Results  Component Value Date   WBC 25.5 (H) 09/17/2019   HGB 8.3 (L) 09/17/2019   HCT 26.6 (L) 09/17/2019   MCV 87.5 09/17/2019   PLT 285 09/17/2019     BMET Recent Labs    09/16/19 0444 09/17/19 0609  NA 133* 135  K 4.3 4.3  CL 96* 98  CO2 16* 19*  GLUCOSE 94 122*  BUN 104* 96*  CREATININE 6.72* 5.47*  CALCIUM 6.2* 6.9*     Liver Panel  Recent Labs    09/16/19 0444 09/17/19 0609  ALBUMIN 1.5* 1.4*       Sedimentation Rate No results for input(s): ESRSEDRATE in the last 72 hours. C-Reactive Protein No results for input(s): CRP in the last 72 hours.  Micro Results: Recent Results (from the past 720 hour(s))  Urine culture     Status: Abnormal   Collection Time: 09/08/19  3:58 PM   Specimen: In/Out Cath Urine  Result Value Ref Range Status   Specimen Description   Final    IN/OUT CATH URINE Performed at [facility], [address], [place], [place] [address]    Special  Requests   Final    NONE Performed at [facility], [address] [place], [place] [address]    Culture >=100,000 COLONIES/mL YEAST (A)  Final   Report Status 09/10/2019 FINAL  Final  SARS Coronavirus 2 [facility] order, Performed in [facility] hospital lab) Nasopharyngeal Nasopharyngeal Swab     Status: None   Collection Time: 09/08/19  3:59 PM   Specimen: Nasopharyngeal Swab  Result Value Ref Range Status   SARS Coronavirus 2 NEGATIVE NEGATIVE Final    Comment: (NOTE) If result  is NEGATIVE SARS-CoV-2 target nucleic acids are NOT DETECTED. The SARS-CoV-2 RNA is generally detectable in upper and lower  respiratory specimens during the acute phase of infection. The lowest  concentration of SARS-CoV-2 viral copies this assay can detect is 250  copies / mL. A negative result does not preclude SARS-CoV-2 infection  and should not be used as the sole basis for treatment or other  patient management decisions.  A negative result may occur with  improper specimen collection / handling, submission of specimen other  than nasopharyngeal swab, presence of viral mutation(s) within the  areas targeted by this assay, and inadequate number of viral copies  (<250 copies / mL). A negative result must be combined with clinical  observations, patient history, and epidemiological information. If result is POSITIVE SARS-CoV-2 target nucleic acids are DETECTED. The SARS-CoV-2 RNA is generally detectable in upper and lower  respiratory specimens dur ing the acute phase of infection.  Positive  results are indicative of active infection with SARS-CoV-2.  Clinical  correlation with patient history and other diagnostic information is  necessary to determine patient infection status.  Positive results do  not rule out bacterial infection or co-infection with other viruses. If result is PRESUMPTIVE POSTIVE SARS-CoV-2 nucleic acids MAY BE PRESENT.   A presumptive positive result was obtained on the  submitted specimen  and confirmed on repeat testing.  While 2019 novel coronavirus  (SARS-CoV-2) nucleic acids may be present in the submitted sample  additional confirmatory testing may be necessary for epidemiological  and / or clinical management purposes  to differentiate between  SARS-CoV-2 and other Sarbecovirus currently known to infect humans.  If clinically indicated additional testing with an alternate test  methodology [id number]) is advised. The SARS-CoV-2 RNA is generally  detectable in upper and lower respiratory sp ecimens during the acute  phase of infection. The expected result is Negative. Fact Sheet for Patients:  StrictlyIdeas.no Fact Sheet for Healthcare Providers: BankingDealers.co.za This test is not yet approved or cleared by the [place] FDA and has been authorized for detection and/or diagnosis of SARS-CoV-2 by FDA under an Emergency Use Authorization (EUA).  This EUA will remain in effect (meaning this test can be used) for the duration of the COVID-19 declaration under Section 564(b)(1) of the Act, 21 U.S.C. section 360bbb-3(b)(1), unless the authorization is terminated or revoked sooner. Performed at [facility], [address], [place], [place] [address]   Blood Culture (routine x 2)     Status: None   Collection Time: 09/08/19  4:07 PM   Specimen: BLOOD RIGHT WRIST  Result Value Ref Range Status   Specimen Description BLOOD RIGHT WRIST  Final   Special Requests   Final    BOTTLES DRAWN AEROBIC AND ANAEROBIC Blood Culture adequate volume   Culture   Final    NO GROWTH 5 DAYS Performed at [facility], [address] [place], [place] [address]    Report Status 09/13/2019 FINAL  Final  Blood Culture (routine x 2)     Status: None   Collection Time: 09/08/19  4:17 PM   Specimen: BLOOD LEFT HAND  Result Value Ref Range Status   Specimen Description BLOOD LEFT HAND  Final   Special Requests   Final     BOTTLES DRAWN AEROBIC AND ANAEROBIC Blood Culture adequate volume   Culture   Final    NO GROWTH 5 DAYS Performed at [facility], [address], [place],  [address]    Report Status 09/13/2019  FINAL  Final    Studies/Results: Ct Pelvis Wo Contrast  Result Date: 09/16/2019 CLINICAL DATA:  Groin and labial abscess EXAM: CT PELVIS WITHOUT CONTRAST TECHNIQUE: Multidetector CT imaging of the pelvis was performed following the standard protocol without intravenous contrast. COMPARISON:  CT 09/10/2019 FINDINGS: Urinary Tract: Large habitus results in overall grainy appearance of images and limits the exam. Foley catheter in the bladder. Bowel:  Unremarkable visualized pelvic bowel loops. Vascular/Lymphatic: Enlarged left inguinal lymph nodes up to 12 mm. Reproductive: Status post hysterectomy. No adnexal mass. Persistent gas and inflammatory change within the mons pubis with small gas in soft tissue thickening tracking to the left labia. Suspected packing material at the left labia. No appreciable focal fluid collection though no intravenous contrast given. Other:  No pelvic effusion Musculoskeletal: No acute osseous abnormality IMPRESSION: 1. Persistent soft tissue gas and inflammatory change at the mons pubis, raising concern for necrotizing infection. Slight decreased gas collection at the left labia with what appears to be packing material. No definitive focal fluid collection at the left labia to suggest residual abscess however examination limited by habitus and absence of intravenous contrast. There is ongoing edema and inflammatory change, compatible with soft tissue infection. 2. Mild left inguinal adenopathy, likely reactive Electronically Signed   By: Donavan Foil M.D.   On: 09/16/2019 00:17   Ir Fluoro Guide Cv Line Right  Result Date: 09/16/2019 INDICATION: 63 year old female with a history of renal failure and leukocytosis EXAM: IMAGE GUIDED PLACEMENT OF TEMPORARY HEMODIALYSIS CATHETER  MEDICATIONS: NONE ANESTHESIA/SEDATION: NONE FLUOROSCOPY TIME:  Fluoroscopy Time: 0 minutes 6 seconds (2 mGy). COMPLICATIONS: None PROCEDURE: Informed written consent was obtained from the patient's family after a discussion of the risks, benefits, and alternatives to treatment. Questions regarding the procedure were encouraged and answered. The right neck was prepped with chlorhexidine in a sterile fashion, and a sterile drape was applied covering the operative field. Maximum barrier sterile technique with sterile gowns and gloves were used for the procedure. A timeout was performed prior to the initiation of the procedure. A micropuncture kit was utilized to access the right internal jugular vein under direct, real-time ultrasound guidance after the overlying soft tissues were anesthetized with 1% lidocaine with epinephrine. Ultrasound image documentation was performed. The microwire was kinked to measure appropriate catheter length. A stiff glidewire was advanced to the level of the IVC. A 16 cm hemodialysis catheter was then placed over the wire. Final catheter positioning was confirmed and documented with a spot radiographic image. The catheter aspirates and flushes normally. The catheter was flushed with appropriate volume heparin dwells. Dressings were applied. The patient tolerated the procedure well without immediate post procedural complication. IMPRESSION: Status post image guided placement of right IJ temporary hemodialysis catheter. Signed, Dulcy Fanny. Dellia Nims, RPVI [facility] Radiology Electronically Signed   By: Corrie Mckusick D.O.   On: 09/16/2019 14:35   Ir US Guide Vasc Access Right  Result Date: 09/16/2019 INDICATION: 63 year old female with a history of renal failure and leukocytosis EXAM: IMAGE GUIDED PLACEMENT OF TEMPORARY HEMODIALYSIS CATHETER MEDICATIONS: NONE ANESTHESIA/SEDATION: NONE FLUOROSCOPY TIME:  Fluoroscopy Time: 0 minutes 6 seconds  (2 mGy). COMPLICATIONS: None PROCEDURE: Informed written consent was obtained from the patient's family after a discussion of the risks, benefits, and alternatives to treatment. Questions regarding the procedure were encouraged and answered. The right neck was prepped with chlorhexidine in a sterile fashion, and a sterile drape was applied covering the operative field. Maximum barrier sterile  technique with sterile gowns and gloves were used for the procedure. A timeout was performed prior to the initiation of the procedure. A micropuncture kit was utilized to access the right internal jugular vein under direct, real-time ultrasound guidance after the overlying soft tissues were anesthetized with 1% lidocaine with epinephrine. Ultrasound image documentation was performed. The microwire was kinked to measure appropriate catheter length. A stiff glidewire was advanced to the level of the IVC. A 16 cm hemodialysis catheter was then placed over the wire. Final catheter positioning was confirmed and documented with a spot radiographic image. The catheter aspirates and flushes normally. The catheter was flushed with appropriate volume heparin dwells. Dressings were applied. The patient tolerated the procedure well without immediate post procedural complication. IMPRESSION: Status post image guided placement of right IJ temporary hemodialysis catheter. Signed, Dulcy Fanny. Dellia Nims, RPVI [facility] Radiology Electronically Signed   By: Corrie Mckusick D.O.   On: 09/16/2019 14:35   Dg Chest Port 1 View  Result Date: 09/15/2019 CLINICAL DATA:  Reason for exam: AMS Nurse tech reports patient has bilateral pneumonia. Hx of LBBB, htn, diabetes, cad, chf. Hx of coronary stent. Quit smoking 2007. EXAM: PORTABLE CHEST 1 VIEW COMPARISON:  Chest radiograph 09/13/2019 FINDINGS: Stable cardiomediastinal contours with enlarged heart size. There are persistent bilateral airspace  opacities predominantly in the lower lungs. No pneumothorax or large pleural effusion. No acute finding in the visualized skeleton. IMPRESSION: Persistent predominantly lower lobe airspace opacities bilaterally most likely representing infection. Electronically Signed   By: Audie Pinto M.D.   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 female with labial abscess and CT findings concerning for Fournier's; associated sepsis and end-organ failure. She is POD 1 following debridement and the wound is open/packed. Unfortunately no cultures taken from OR to help with targeting antibiotic therapy.  She has been receiving ampicillin sulbactam and linezolid for 5 days now.  Overnight it seems that she has developed a rash that is suspicious for a drug exanthem.  We will add on a differential to her morning CBC to assess for eosinophilia.  For now Dr. Tyrell Antonio is treating with topical antihistamine.  We will change her antibiotic regimen to oral Levaquin and oral doxycycline.  Will not continue anaerobic coverage at this time since she has had proper debridement.   #1 Labial abscess with concern for Fournier's, fistula with sepsis and end organ failure including ARF  ---change to PO levaquin (dosed for HD needs) and doxycycline PO to treat 10 days total post surgery (10/16).   #2 ARF: going onto HD, limit nephrotoxic   #3 Urticarial Rash: new onset. Concern that this is related to one of her current antibiotics. We will plan on switching as outlined  above. Will add differential to today's labs.     LOS: 9 days   Antibiotic Days: 9   Day 5 amp-sulbactam + Linezolid    Janene Madeira, MSN, NP-C [facility]  [place]._0 [email or web address] Pager: [phone] Office: [phone] [place]: [phone]

## 2019-09-18 DIAGNOSIS — N1832 Chronic kidney disease, stage 3b: Secondary | ICD-10-CM

## 2019-09-18 DIAGNOSIS — N179 Acute kidney failure, unspecified: Secondary | ICD-10-CM

## 2019-09-18 DIAGNOSIS — L509 Urticaria, unspecified: Secondary | ICD-10-CM

## 2019-09-18 LAB — RENAL FUNCTION PANEL
Albumin: 1.4 g/dL — ABNORMAL LOW (ref 3.5–5.0)
Anion gap: 17 — ABNORMAL HIGH (ref 5–15)
BUN: 98 mg/dL — ABNORMAL HIGH (ref 8–23)
CO2: 20 mmol/L — ABNORMAL LOW (ref 22–32)
Calcium: 6.9 mg/dL — ABNORMAL LOW (ref 8.9–10.3)
Chloride: 99 mmol/L (ref 98–111)
Creatinine, Ser: 4.92 mg/dL — ABNORMAL HIGH (ref 0.44–1.00)
GFR calc Af Amer: 10 mL/min — ABNORMAL LOW (ref >60)
GFR calc non Af Amer: 9 mL/min — ABNORMAL LOW (ref >60)
Glucose, Bld: 117 mg/dL — ABNORMAL HIGH (ref 70–99)
Phosphorus: 6.1 mg/dL — ABNORMAL HIGH (ref 2.5–4.6)
Potassium: 4.1 mmol/L (ref 3.5–5.1)
Sodium: 136 mmol/L (ref 135–145)

## 2019-09-18 LAB — GLUCOSE, CAPILLARY
Glucose-Capillary: 98 mg/dL (ref 70–99)
Glucose-Capillary: 137 mg/dL — ABNORMAL HIGH (ref 70–99)
Glucose-Capillary: 123 mg/dL — ABNORMAL HIGH (ref 70–99)
Glucose-Capillary: 132 mg/dL — ABNORMAL HIGH (ref 70–99)

## 2019-09-18 LAB — CBC
HCT: 26.3 % — ABNORMAL LOW (ref 36.0–46.0)
Hemoglobin: 8.2 g/dL — ABNORMAL LOW (ref 12.0–15.0)
MCH: 27.4 pg (ref 26.0–34.0)
MCHC: 31.2 g/dL (ref 30.0–36.0)
MCV: 88 fL (ref 80.0–100.0)
Platelets: 251 10*3/uL (ref 150–400)
RBC: 2.99 MIL/uL — ABNORMAL LOW (ref 3.87–5.11)
RDW: 15 % (ref 11.5–15.5)
WBC: 20.6 10*3/uL — ABNORMAL HIGH (ref 4.0–10.5)
nRBC: 0 % (ref 0.0–0.2)

## 2019-09-18 LAB — HEPATITIS B E ANTIBODY: Hep B E Ab: NEGATIVE

## 2019-09-18 MED ORDER — LEVOFLOXACIN 500 MG PO TABS
500.0000 mg | ORAL_TABLET | ORAL | Status: AC
  Administered 2019-09-19 – 2019-09-25 (×4): 500 mg via ORAL
  Filled 2019-09-18 (×4): qty 1

## 2019-09-18 MED ORDER — HEPARIN SODIUM (PORCINE) 1000 UNIT/ML IJ SOLN
INTRAMUSCULAR | Status: AC
  Filled 2019-09-18: qty 3

## 2019-09-18 NOTE — Progress Notes (Signed)
Patient refused CPAP tonight 

## 2019-09-18 NOTE — Progress Notes (Addendum)
PROGRESS NOTE    Maureen Jackson  QZE:[id number] DOB: August 27, 1956 DOA: 09/08/2019 PCP: Neale Burly, MD   Brief Narrative: 63 year old with past medical history significant for morbid obesity, systolic heart failure, hypothyroidism, hypertension, CAD and obstructive sleep apnea.  Presents with  generalized weakness, difficulty voiding, poor appetite and insomnia for 3 days.  September 26,  2 days prior to hospitalization she was seen in the emergency department for left labia upper thigh abscess which was drained.  Discharge home on oral antibiotics.  Patient had worsening renal function in the setting of concern for Fournier gangrene, patient was transferred from [facility]  to [facility] for further management.    Assessment & Plan:   Principal Problem:   Fournier's gangrene in female Active Problems:   Hypothyroidism   Essential hypertension   Morbid obesity/BMI > 55   Diabetes mellitus type 2 with complications, uncontrolled (HCC)   CAD, multiple vessel   Ischemic cardiomyopathy   Chronic combined systolic and diastolic CHF (congestive heart failure) (EF 30 to 35 %)   CAP (community acquired pneumonia)   Labial abscess   Abscess   Fistula   AMS (altered mental status)   Drug rash  1-mons/labial abscess/Fournier gangrene complicated with sepsis/acute metabolic encephalopathy -Patient presented with leukocytosis, fever post sediment in [place].  Repeat CT scan done on 10/5 findings worrisome for Fournier gangrene: Patient was taken to the OR by general surgery on 10/6. -ID has been consulted and has been helping with antibiotics: -Patient developed rash today, antibiotics has been changed from Zyvox, Unasyn  to Levaquin, and Doxycycline.  -Surgery recommending wet-to-dry dressing twice a day -Might required further debridement per surgery.  -continue with levaquin and doxy.   2-Rash; Benadryl cream and oral benadryl PRN.  Unasyn and Zyvox discontinue.  Monitor on CHG baths.  Stable, not worse.   3-AKI;, volume overload non-anion gap metabolic acidosis.  Suspected acute tubular necrosis, oliguric hypocalcemia hyperphosphatemia: Nephrology consulted, patient was a started on hemodialysis on 10/6 HD on 10-08.  4-Acute on chronic systolic heart failure exacerbation, ejection fraction 30 to 35%: Ischemic cardiomyopathy status post DES to LAD and RCA 2018.  Volume control with hemodialysis Holding antiplatelet due to surgical procedure resume when is okay by surgery  5-Diabetes: Continue with sliding scale insulin  Morbid obesity with OSA: Continue with CPAP Body mass index 60  Hypothyroidism continue with Synthroid Acute metabolic encephalopathy: Related to acute illness renal failure sepsis infection.   Less confuse, less lethargic today.    Nutrition Problem: Inadequate oral intake Etiology: acute illness(pneumonia; abscess in left labia/upper thigh area)    Signs/Symptoms: meal completion < 25%    Interventions: Ensure Enlive (each supplement provides 350kcal and 20 grams of protein), MVI  Estimated body mass index is 60.09 kg/m as calculated from the following:   Height as of this encounter: 5\' 5"  (1.651 m).   Weight as of this encounter: 163.8 kg.   DVT prophylaxis: Lovenox Code Status: Full code Family Communication: Family at bedside Disposition Plan: remain in hospital for HD, renal failure, and might need further wash out of wound Consultants:   surgery  ID  Procedures:   Irrigation and debridement months abscess 6 x 6 by day 10/6  Antimicrobials:  Levaquin and doxy Received Zyvox, Unasyn  Subjective: Seen during HD. Alert, less lethargic, less confuse.   Objective: Vitals:   09/18/19 1158 09/18/19 1252 09/18/19 1256 09/18/19 1301  BP: 130/65 (P) 137/68 (!) (P) 151/71 (!) (P) 141/66  Pulse: 68 (P) 67 (P) 66 (P) 67  Resp: 18 (P) 20 (P) 20 (P) 20  Temp: 97.6 F (36.4 C)     TempSrc: Oral     SpO2: 96%     Weight:       Height:        Intake/Output Summary (Last 24 hours) at 09/18/2019 1416 Last data filed at 09/18/2019 1200 Gross per 24 hour  Intake 720 ml  Output 1050 ml  Net -330 ml   Filed Weights   09/16/19 0300 09/17/19 0210 09/18/19 0108  Weight: (!) 163.9 kg (!) 163.9 kg (!) 163.8 kg    Examination:  General exam: Alert, morbid obese Respiratory system: CTA Cardiovascular system: S 1, S 2 RRR Gastrointestinal system: BS present, soft, obese, dressing pubic area.  Central nervous system: alert, follows command. Less confuse Extremities: no edenma Skin:  Rash abdominal wall same    Data Reviewed: I have personally reviewed following labs and imaging studies  CBC: Recent Labs  Lab 09/14/19 0815 09/15/19 0511 09/16/19 0444 09/17/19 0947 09/18/19 0354  WBC 22.7* 21.0* 22.9* 24.7*  25.5* 20.6*  NEUTROABS 18.1* 16.7*  --  20.2*  --   HGB 8.1* 8.4* 8.8* 8.4*  8.3* 8.2*  HCT 25.1* 25.8* 26.9* 25.7*  26.6* 26.3*  MCV 85.4 85.1 84.6 86.5  87.5 88.0  PLT 343 283 281 263  285 790   Basic Metabolic Panel: Recent Labs  Lab 09/14/19 0815 09/15/19 0511 09/16/19 0444 09/17/19 0609 09/18/19 0354  NA 132* 132* 133* 135 136  K 4.9 4.6 4.3 4.3 4.1  CL 97* 98 96* 98 99  CO2 17* 17* 16* 19* 20*  GLUCOSE 83 87 94 122* 117*  BUN 96* 102* 104* 96* 98*  CREATININE 7.53* 7.30* 6.72* 5.47* 4.92*  CALCIUM 5.9* 5.9* 6.2* 6.9* 6.9*  PHOS 9.3* 9.1* 8.3* 7.0* 6.1*   GFR: Estimated Creatinine Clearance: 18.9 mL/min (A) (by C-G formula based on SCr of 4.92 mg/dL (H)). Liver Function Tests: Recent Labs  Lab 09/14/19 0815 09/15/19 0511 09/16/19 0444 09/17/19 0609 09/18/19 0354  ALBUMIN 1.3* 1.5* 1.5* 1.4* 1.4*   No results for input(s): LIPASE, AMYLASE in the last 168 hours. No results for input(s): AMMONIA in the last 168 hours. Coagulation Profile: No results for input(s): INR, PROTIME in the last 168 hours. Cardiac Enzymes: No results for input(s): CKTOTAL, CKMB, CKMBINDEX,  TROPONINI in the last 168 hours. BNP (last 3 results) No results for input(s): PROBNP in the last 8760 hours. HbA1C: No results for input(s): HGBA1C in the last 72 hours. CBG: Recent Labs  Lab 09/17/19 1114 09/17/19 1618 09/17/19 2112 09/18/19 0637 09/18/19 1119  GLUCAP 126* 155* 129* 98 137*   Lipid Profile: No results for input(s): CHOL, HDL, LDLCALC, TRIG, CHOLHDL, LDLDIRECT in the last 72 hours. Thyroid Function Tests: No results for input(s): TSH, T4TOTAL, FREET4, T3FREE, THYROIDAB in the last 72 hours. Anemia Panel: Recent Labs    09/15/19 1636  FERRITIN 390*  TIBC 178*  IRON 37   Sepsis Labs: Recent Labs  Lab 09/15/19 0917  LATICACIDVEN 0.9    Recent Results (from the past 240 hour(s))  Urine culture     Status: Abnormal   Collection Time: 09/08/19  3:58 PM   Specimen: In/Out Cath Urine  Result Value Ref Range Status   Specimen Description   Final    IN/OUT CATH URINE Performed at [facility], [address], [place], [place] [address]    Special Requests  Final    NONE Performed at [facility], [address] [place], [place] [address]    Culture >=100,000 COLONIES/mL YEAST (A)  Final   Report Status 09/10/2019 FINAL  Final  SARS Coronavirus 2 [facility] order, Performed in [facility] hospital lab) Nasopharyngeal Nasopharyngeal Swab     Status: None   Collection Time: 09/08/19  3:59 PM   Specimen: Nasopharyngeal Swab  Result Value Ref Range Status   SARS Coronavirus 2 NEGATIVE NEGATIVE Final    Comment: (NOTE) If result is NEGATIVE SARS-CoV-2 target nucleic acids are NOT DETECTED. The SARS-CoV-2 RNA is generally detectable in upper and lower  respiratory specimens during the acute phase of infection. The lowest  concentration of SARS-CoV-2 viral copies this assay can detect is 250  copies / mL. A negative result does not preclude SARS-CoV-2 infection  and should not be used as the sole basis for treatment or other  patient management decisions.   A negative result may occur with  improper specimen collection / handling, submission of specimen other  than nasopharyngeal swab, presence of viral mutation(s) within the  areas targeted by this assay, and inadequate number of viral copies  (<250 copies / mL). A negative result must be combined with clinical  observations, patient history, and epidemiological information. If result is POSITIVE SARS-CoV-2 target nucleic acids are DETECTED. The SARS-CoV-2 RNA is generally detectable in upper and lower  respiratory specimens dur ing the acute phase of infection.  Positive  results are indicative of active infection with SARS-CoV-2.  Clinical  correlation with patient history and other diagnostic information is  necessary to determine patient infection status.  Positive results do  not rule out bacterial infection or co-infection with other viruses. If result is PRESUMPTIVE POSTIVE SARS-CoV-2 nucleic acids MAY BE PRESENT.   A presumptive positive result was obtained on the submitted specimen  and confirmed on repeat testing.  While 2019 novel coronavirus  (SARS-CoV-2) nucleic acids may be present in the submitted sample  additional confirmatory testing may be necessary for epidemiological  and / or clinical management purposes  to differentiate between  SARS-CoV-2 and other Sarbecovirus currently known to infect humans.  If clinically indicated additional testing with an alternate test  methodology [phone]) is advised. The SARS-CoV-2 RNA is generally  detectable in upper and lower respiratory sp ecimens during the acute  phase of infection. The expected result is Negative. Fact Sheet for Patients:  StrictlyIdeas.no Fact Sheet for Healthcare Providers: BankingDealers.co.za This test is not yet approved or cleared by the [place] FDA and has been authorized for detection and/or diagnosis of SARS-CoV-2 by FDA under an Emergency Use  Authorization (EUA).  This EUA will remain in effect (meaning this test can be used) for the duration of the COVID-19 declaration under Section 564(b)(1) of the Act, 21 U.S.C. section 360bbb-3(b)(1), unless the authorization is terminated or revoked sooner. Performed at [facility], [address], [place], [place] [address]   Blood Culture (routine x 2)     Status: None   Collection Time: 09/08/19  4:07 PM   Specimen: BLOOD RIGHT WRIST  Result Value Ref Range Status   Specimen Description BLOOD RIGHT WRIST  Final   Special Requests   Final    BOTTLES DRAWN AEROBIC AND ANAEROBIC Blood Culture adequate volume   Culture   Final    NO GROWTH 5 DAYS Performed at [facility], [address], [place], [place] [address]    Report Status 09/13/2019 FINAL  Final  Blood Culture (routine x 2)     Status: None   Collection Time: 09/08/19  4:17 PM   Specimen: BLOOD LEFT HAND  Result Value Ref Range Status   Specimen Description BLOOD LEFT HAND  Final   Special Requests   Final    BOTTLES DRAWN AEROBIC AND ANAEROBIC Blood Culture adequate volume   Culture   Final    NO GROWTH 5 DAYS Performed at [facility], [address] [place], [place] [address]    Report Status 09/13/2019 FINAL  Final         Radiology Studies: No results found.      Scheduled Meds: . atorvastatin  80 mg Oral QPM  . calcium acetate  1,334 mg Oral TID WC  . Chlorhexidine Gluconate Cloth  6 each Topical Daily  . Chlorhexidine Gluconate Cloth  6 each Topical Q0600  . Chlorhexidine Gluconate Cloth  6 each Topical Q0600  . [START ON 09/22/2019] darbepoetin (ARANESP) injection - NON-DIALYSIS  60 mcg Subcutaneous Q Mon-1800  . doxycycline  100 mg Oral Q12H  . feeding supplement  1 Container Oral TID BM  . feeding supplement (PRO-STAT SUGAR FREE 64)  30 mL Oral TID WC  . heparin injection (subcutaneous)  5,000 Units Subcutaneous Q8H  . [START ON 09/19/2019] levofloxacin  500 mg Oral Q48H  . levothyroxine  125  mcg Oral Q0600  . multivitamin  1 tablet Oral QHS  . nystatin   Topical TID  . sodium chloride flush  10-40 mL Intracatheter Q12H   Continuous Infusions: . sodium chloride       LOS: 10 days    Time spent: 35 minutes.     Elmarie Shiley, MD [facility] Pager [phone]  If 7PM-7AM, please contact night-coverage [email or web address] Password [email or web address] 09/18/2019, 2:16 PM

## 2019-09-18 NOTE — Progress Notes (Addendum)
2 Days Post-Op    CC: Sepsis with labial abscess  Subjective: Patient is more alert today, wanted to know she can get out she is tired of being in bed.  Abscess cavities pretty deep but is overall quite clean.  I did the dressing change at the bedside and she tolerated it very well.  Objective: Vital signs in last 24 hours: Temp:  [97.2 F (36.2 C)-98.6 F (37 C)] 97.2 F (36.2 C) (10/08 0525) Pulse Rate:  [62-70] 62 (10/08 0525) Resp:  [16-20] 18 (10/08 0525) BP: (123-133)/(50-106) 123/50 (10/08 0525) SpO2:  [93 %-96 %] 93 % (10/08 0525) Weight:  [163.8 kg] 163.8 kg (10/08 0108) Last BM Date: 09/17/19 920 p.o. 127 IV 1050 urine Afebrile vital signs are stable blood pressures intermittently up and down. Creatinine 4.92, anion gap 17, potassium 4.1 WBC 20.6 hemoglobin 8.2, hematocrit 26.3, platelets 251,000 Intake/Output from previous day: 10/07 0701 - 10/08 0700 In: 1610 [P.O.:920; I.V.:10; IV Piggyback:117] Out: 1050 [Urine:1050] Intake/Output this shift: No intake/output data recorded.  General appearance: alert, cooperative, no distress and Seems more alert and oriented today. Resp: clear to auscultation bilaterally Incision/Wound: The open site looks pretty much like it did yesterday.  There is a little bit of fat necrosis at the base.  This should clean up with wet-to-dry dressings.  She tolerated the dressing change well.  The nursing staff is found a second site see picture below the site opened on 09/16/2019.  There is a 2 cm opening.  When placing a applicator stick to probe the site it went to a depth of 11 cm with purulent drainage coming from the site.      Lab Results:  Recent Labs    09/17/19 0947 09/18/19 0354  WBC 24.7*  25.5* 20.6*  HGB 8.4*  8.3* 8.2*  HCT 25.7*  26.6* 26.3*  PLT 263  285 251    BMET Recent Labs    09/17/19 0609 09/18/19 0354  NA 135 136  K 4.3 4.1  CL 98 99  CO2 19* 20*  GLUCOSE 122* 117*  BUN 96* 98*  CREATININE  5.47* 4.92*  CALCIUM 6.9* 6.9*   PT/INR No results for input(s): LABPROT, INR in the last 72 hours.  Recent Labs  Lab 09/14/19 0815 09/15/19 0511 09/16/19 0444 09/17/19 0609 09/18/19 0354  ALBUMIN 1.3* 1.5* 1.5* 1.4* 1.4*     Lipase  No results found for: LIPASE   Medications: . atorvastatin  80 mg Oral QPM  . calcium acetate  1,334 mg Oral TID WC  . Chlorhexidine Gluconate Cloth  6 each Topical Daily  . Chlorhexidine Gluconate Cloth  6 each Topical Q0600  . Chlorhexidine Gluconate Cloth  6 each Topical Q0600  . [START ON 09/22/2019] darbepoetin (ARANESP) injection - NON-DIALYSIS  60 mcg Subcutaneous Q Mon-1800  . doxycycline  100 mg Oral Q12H  . feeding supplement  1 Container Oral TID BM  . feeding supplement (PRO-STAT SUGAR FREE 64)  30 mL Oral TID WC  . heparin injection (subcutaneous)  5,000 Units Subcutaneous Q8H  . levofloxacin  750 mg Oral Q48H  . levothyroxine  125 mcg Oral Q0600  . multivitamin  1 tablet Oral QHS  . nystatin   Topical TID  . sodium chloride flush  10-40 mL Intracatheter Q12H  urine culture>> 100k yeast Bloof culture's negative Assessment/Plan Morbid obesity BMI 60.13 DM CAD with cardiac stents in place on plavix Hx ischemic cardiomyopathy Acute on chronic systolic heart failure EF 30-35% Anemia Multifactorial  encephalopathy Hypothyroidism HLD AKI on CKD  - Creatinine 1.88(9/28)>>4.13(9/30)>>7.42(10/3)>>6.72(10/6)>>4.92(10/8)   Sepsis with left labial abscess Irrigation and debridement of mons abscess 6 x 6 x 8 cm 09/16/2019 DR. Coralie Keens  -New site found 09/18/2019 by nursing staff; see picture above. - CT scan from 9/30 shows extensive inflammatory/infectious process involving the mons pubis, labia, perineum and suprapubic regions with extensive gas formation worrisome for Fournier's gangrene; no discrete drainable soft tissue abscess - Wound is still open and draining. WBC is stable.Continue broad spectrum antibiotics per  IDand wound care for now - OR tomorrow 10/06 -Leukocytosis:WBC 21.0(9/28)>>20.9(10/2)>>22.9(10/6)>>20.6(10/8)  ID:  Vancomycin 9/28x 2 doses;Rocephin 9/28-10/2, azithromycin 9/28-10/2, clindamycin 9/30-10/2. Currently on Zyvox and Unasyn 10/2-10/7;  Doxycycline 10/7>> day 2 VTE -SCDs, sq heparin,last does of plavix 10/02 FEN -IVF,CM, NPO at midnight Foley -in place Follow up -TBD. POCBasma, [place] Daughter [phone]  [phone]  Price,[name]   [phone]  [name] Sister [phone]    [name]   [phone]    Plan: I will review with Dr. Ninfa Linden; continue twice daily dressing changes.  She has had breakfast this a.m.  I will make her n.p.o. after midnight.        LOS: 10 days    Dariel Pellecchia 09/18/2019 [phone]

## 2019-09-18 NOTE — TOC Progression Note (Addendum)
Transition of Care [facility]) - Progression Note    Patient Details  Name: Maureen Jackson MRN: [id number] Date of Birth: 07-Apr-1956  Transition of [facility] CM/SW Contact  Zenon Mayo, RN Phone Number: 09/18/2019, 5:17 PM  Clinical Narrative:    From home with daughter, NCM spoke with patient and she is agreeable to SNF work up, she gives this NCM permission to fax her information out to the SNF's in [place].  10/9 Tomi Bamberger RN, BSN - patient had bed offer from [facility], she states she does not want to go to [place] [facility] is not accepting patients because they have covid patients.  MD notified.    Expected Discharge Plan: Skilled Nursing Facility Barriers to Discharge: No Barriers Identified  Expected Discharge Plan and Services Expected Discharge Plan: [place] In-house Referral: NA Discharge Planning Services: CM Consult Post Acute Care Choice: [place] Living arrangements for the past 2 months: Apartment                 DME Arranged: (NA)         HH Arranged: NA           Social Determinants of Health (SDOH) Interventions    Readmission Risk Interventions Readmission Risk Prevention Plan 09/18/2019  Transportation Screening Complete  HRI or [place] Complete  Social Work Consult for [place] Planning/Counseling Complete  Palliative Care Screening Not Applicable  Medication Review Press photographer) Complete  Some recent data might be hidden

## 2019-09-18 NOTE — Progress Notes (Signed)
Subjective:  No further itching. She describes the rash symptoms to be improving.   Plan for HD today  Other interval additions -  WBC decreased 20.6K, Afebrile  Antibiotics:  Anti-infectives (From admission, onward)   Start     Dose/Rate Route Frequency Ordered Stop   09/19/19 1600  levofloxacin (LEVAQUIN) tablet 500 mg     500 mg Oral Every 48 hours 09/18/19 1118     09/17/19 1600  levofloxacin (LEVAQUIN) tablet 750 mg  Status:  Discontinued     750 mg Oral Every 48 hours 09/17/19 1100 09/18/19 1118   09/17/19 1600  doxycycline (VIBRA-TABS) tablet 100 mg     100 mg Oral Every 12 hours 09/17/19 1100     09/16/19 1600  linezolid (ZYVOX) IVPB 600 mg  Status:  Discontinued     600 mg 300 mL/hr over 60 Minutes Intravenous Every 12 hours 09/16/19 1556 09/17/19 1100   09/12/19 1100  linezolid (ZYVOX) IVPB 600 mg  Status:  Discontinued     600 mg 300 mL/hr over 60 Minutes Intravenous Every 12 hours 09/12/19 0945 09/16/19 1556   09/12/19 1100  Ampicillin-Sulbactam (UNASYN) 3 g in sodium chloride 0.9 % 100 mL IVPB  Status:  Discontinued     3 g 200 mL/hr over 30 Minutes Intravenous Every 12 hours 09/12/19 0945 09/17/19 1100   09/10/19 1430  clindamycin (CLEOCIN) IVPB 600 mg  Status:  Discontinued     600 mg 100 mL/hr over 30 Minutes Intravenous Every 6 hours 09/10/19 1410 09/12/19 0945   09/10/19 0742  vancomycin variable dose per unstable renal function (pharmacist dosing)  Status:  Discontinued      Does not apply See admin instructions 09/10/19 0742 09/10/19 1413   09/10/19 0000  vancomycin (VANCOCIN) 1,750 mg in sodium chloride 0.9 % 500 mL IVPB  Status:  Discontinued     1,750 mg 250 mL/hr over 120 Minutes Intravenous Every 24 hours 09/09/19 0912 09/09/19 0928   09/10/19 0000  vancomycin (VANCOCIN) 1,750 mg in sodium chloride 0.9 % 500 mL IVPB  Status:  Discontinued     1,750 mg 250 mL/hr over 120 Minutes Intravenous Every 24 hours 09/09/19 0940 09/10/19 0751   09/09/19 1030  vancomycin (VANCOCIN) IVPB 750 mg/150 ml premix  Status:  Discontinued     750 mg 150 mL/hr over 60 Minutes Intravenous  Once 09/09/19 0928 09/09/19 0938   09/09/19 0930  vancomycin (VANCOCIN) IVPB 1000 mg/200 mL premix  Status:  Discontinued     1,000 mg 200 mL/hr over 60 Minutes Intravenous  Once 09/09/19 0928 09/09/19 0938   09/08/19 2300  azithromycin (ZITHROMAX) 500 mg in sodium chloride 0.9 % 250 mL IVPB  Status:  Discontinued     500 mg 250 mL/hr over 60 Minutes Intravenous Every 24 hours 09/08/19 2230 09/12/19 0945   09/08/19 2300  cefTRIAXone (ROCEPHIN) 2 g in sodium chloride 0.9 % 100 mL IVPB  Status:  Discontinued     2 g 200 mL/hr over 30 Minutes Intravenous Every 24 hours 09/08/19 2230 09/12/19 0945   09/08/19 2300  vancomycin (VANCOCIN) 1,500 mg in sodium chloride 0.9 % 500 mL IVPB     1,500 mg 250 mL/hr over 120 Minutes Intravenous  Once 09/08/19 2254 09/09/19 0434   09/08/19 1600  vancomycin (VANCOCIN) IVPB 1000 mg/200 mL premix     1,000 mg 200 mL/hr over 60 Minutes Intravenous  Once 09/08/19 1558 09/08/19 1929   09/08/19 1600  piperacillin-tazobactam (ZOSYN) IVPB 3.375 g     3.375 g 100 mL/hr over 30 Minutes Intravenous  Once 09/08/19 1558 09/08/19 1655      Medications: Scheduled Meds: . atorvastatin  80 mg Oral QPM  . calcium acetate  1,334 mg Oral TID WC  . Chlorhexidine Gluconate Cloth  6 each Topical Daily  . Chlorhexidine Gluconate Cloth  6 each Topical Q0600  . Chlorhexidine Gluconate Cloth  6 each Topical Q0600  . [START ON 09/22/2019] darbepoetin (ARANESP) injection - NON-DIALYSIS  60 mcg Subcutaneous Q Mon-1800  . doxycycline  100 mg Oral Q12H  . feeding supplement  1 Container Oral TID BM  . feeding supplement (PRO-STAT SUGAR FREE 64)  30 mL Oral TID WC  . heparin injection (subcutaneous)  5,000 Units Subcutaneous Q8H  . [START ON 09/19/2019] levofloxacin  500 mg Oral Q48H  . levothyroxine  125 mcg Oral Q0600  . multivitamin  1 tablet  Oral QHS  . nystatin   Topical TID  . sodium chloride flush  10-40 mL Intracatheter Q12H   Continuous Infusions: . sodium chloride     PRN Meds:.acetaminophen, diphenhydrAMINE, diphenhydrAMINE-zinc acetate, heparin, ipratropium-albuterol, lidocaine, naloxone, sodium chloride flush    Objective: Weight change: -0.1 kg  Intake/Output Summary (Last 24 hours) at 09/18/2019 1419 Last data filed at 09/18/2019 1200 Gross per 24 hour  Intake 720 ml  Output 1050 ml  Net -330 ml   Blood pressure (!) (P) 141/66, pulse (P) 67, temperature 97.6 F (36.4 C), temperature source Oral, resp. rate (P) 20, height 5\' 5"  (1.651 m), weight (!) 163.8 kg, SpO2 96 %. Temp:  [97.2 F (36.2 C)-98.6 F (37 C)] 97.6 F (36.4 C) (10/08 1158) Pulse Rate:  [62-69] (P) 67 (10/08 1301) Resp:  [16-20] (P) 20 (10/08 1301) BP: (123-133)/(50-106) (P) 141/66 (10/08 1301) SpO2:  [93 %-96 %] 96 % (10/08 1158) Weight:  [163.8 kg] 163.8 kg (10/08 0108)  Physical Exam: General: awake, alert and able to communicate needs. Resting in bed itching skin  CVS regular rate, normal  Chest: , no wheezing, no respiratory distress Abdomen: soft non-distended,  Mons Pubis: pictures reviewed from chart today and wound bed overall clean but open.  Integument: rash appears more coalesced today with some evolution of some small vesicles over the stomach as pictured below.  Neuro: nonfocal      CBC: Lab Results  Component Value Date   WBC 20.6 (H) 09/18/2019   HGB 8.2 (L) 09/18/2019   HCT 26.3 (L) 09/18/2019   MCV 88.0 09/18/2019   PLT 251 09/18/2019     BMET Recent Labs    09/17/19 0609 09/18/19 0354  NA 135 136  K 4.3 4.1  CL 98 99  CO2 19* 20*  GLUCOSE 122* 117*  BUN 96* 98*  CREATININE 5.47* 4.92*  CALCIUM 6.9* 6.9*     Liver Panel  Recent Labs    09/17/19 0609 09/18/19 0354  ALBUMIN 1.4* 1.4*       Sedimentation Rate No results for input(s): ESRSEDRATE in the last 72 hours. C-Reactive  Protein No results for input(s): CRP in the last 72 hours.  Micro Results: Recent Results (from the past 720 hour(s))  Urine culture     Status: Abnormal   Collection Time: 09/08/19  3:58 PM   Specimen: In/Out Cath Urine  Result Value Ref Range Status   Specimen Description   Final    IN/OUT CATH URINE Performed at [facility], [address], [place], [place] [address]  Special Requests   Final    NONE Performed at [facility], [address], [place], [place] [address]    Culture >=100,000 COLONIES/mL YEAST (A)  Final   Report Status 09/10/2019 FINAL  Final  SARS Coronavirus 2 [facility] order, Performed in [facility] hospital lab) Nasopharyngeal Nasopharyngeal Swab     Status: None   Collection Time: 09/08/19  3:59 PM   Specimen: Nasopharyngeal Swab  Result Value Ref Range Status   SARS Coronavirus 2 NEGATIVE NEGATIVE Final    Comment: (NOTE) If result is NEGATIVE SARS-CoV-2 target nucleic acids are NOT DETECTED. The SARS-CoV-2 RNA is generally detectable in upper and lower  respiratory specimens during the acute phase of infection. The lowest  concentration of SARS-CoV-2 viral copies this assay can detect is 250  copies / mL. A negative result does not preclude SARS-CoV-2 infection  and should not be used as the sole basis for treatment or other  patient management decisions.  A negative result may occur with  improper specimen collection / handling, submission of specimen other  than nasopharyngeal swab, presence of viral mutation(s) within the  areas targeted by this assay, and inadequate number of viral copies  (<250 copies / mL). A negative result must be combined with clinical  observations, patient history, and epidemiological information. If result is POSITIVE SARS-CoV-2 target nucleic acids are DETECTED. The SARS-CoV-2 RNA is generally detectable in upper and lower  respiratory specimens dur ing the acute phase of infection.  Positive  results are indicative  of active infection with SARS-CoV-2.  Clinical  correlation with patient history and other diagnostic information is  necessary to determine patient infection status.  Positive results do  not rule out bacterial infection or co-infection with other viruses. If result is PRESUMPTIVE POSTIVE SARS-CoV-2 nucleic acids MAY BE PRESENT.   A presumptive positive result was obtained on the submitted specimen  and confirmed on repeat testing.  While 2019 novel coronavirus  (SARS-CoV-2) nucleic acids may be present in the submitted sample  additional confirmatory testing may be necessary for epidemiological  and / or clinical management purposes  to differentiate between  SARS-CoV-2 and other Sarbecovirus currently known to infect humans.  If clinically indicated additional testing with an alternate test  methodology [phone]) is advised. The SARS-CoV-2 RNA is generally  detectable in upper and lower respiratory sp ecimens during the acute  phase of infection. The expected result is Negative. Fact Sheet for Patients:  StrictlyIdeas.no Fact Sheet for Healthcare Providers: BankingDealers.co.za This test is not yet approved or cleared by the [place] FDA and has been authorized for detection and/or diagnosis of SARS-CoV-2 by FDA under an Emergency Use Authorization (EUA).  This EUA will remain in effect (meaning this test can be used) for the duration of the COVID-19 declaration under Section 564(b)(1) of the Act, 21 U.S.C. section 360bbb-3(b)(1), unless the authorization is terminated or revoked sooner. Performed at [facility], [address] [place], [place] [address]   Blood Culture (routine x 2)     Status: None   Collection Time: 09/08/19  4:07 PM   Specimen: BLOOD RIGHT WRIST  Result Value Ref Range Status   Specimen Description BLOOD RIGHT WRIST  Final   Special Requests   Final    BOTTLES DRAWN AEROBIC AND ANAEROBIC Blood Culture  adequate volume   Culture   Final    NO GROWTH 5 DAYS Performed at [facility], [address] [place], [place] [address]    Report Status 09/13/2019  FINAL  Final  Blood Culture (routine x 2)     Status: None   Collection Time: 09/08/19  4:17 PM   Specimen: BLOOD LEFT HAND  Result Value Ref Range Status   Specimen Description BLOOD LEFT HAND  Final   Special Requests   Final    BOTTLES DRAWN AEROBIC AND ANAEROBIC Blood Culture adequate volume   Culture   Final    NO GROWTH 5 DAYS Performed at [facility], [address] [place], [place] [address]    Report Status 09/13/2019 FINAL  Final    Studies/Results: No results found.    Assessment/Plan:  Principal Problem:   Fournier's gangrene in female Active Problems:   Hypothyroidism   Essential hypertension   Morbid obesity/BMI > 55   Diabetes mellitus type 2 with complications, uncontrolled (HCC)   CAD, multiple vessel   Ischemic cardiomyopathy   Chronic combined systolic and diastolic CHF (congestive heart failure) (EF 30 to 35 %)   CAP (community acquired pneumonia)   Labial abscess   Abscess   Fistula   AMS (altered mental status)   Drug rash    Maureen Jackson is a 63 y.o. female with labial abscess and CT findings concerning for Fournier's; associated sepsis and end-organ failure. She is POD 2 following debridement and the wound is open/packed.   #1 Labial abscess with concern for Fournier's, fistula with sepsis and end organ failure including ARF  --- may require take back to OR. If she does I would ask for surgery to please send tissue for cultures to ensure we are not missing something especially in light of having to change her antibiotics with possible allergy. Would re-time antibiotics from last debridement to complete 10 days.   #2 ARF: going onto HD, limit nephrotoxic agents being she may still have some recovery   #3 Urticarial Rash: Would stop CHG baths as I worry it may be irritating current rash  presentation     LOS: 10 days   Antibiotic Days: 10  Day 2 levaquin + doxycycline    Maureen Madeira, MSN, NP-C [place] [facility] [email or web address] Pager: [phone] Office: [phone] [place]: [phone]

## 2019-09-18 NOTE — Progress Notes (Signed)
Patient ID: Maureen Jackson, female   DOB: Feb 28, 1956, 63 y.o.   MRN: [id number] [facility] Progress Note   Assessment/ Plan:   1. Acute kidney Injury: Likely ATN from sepsis/vancomycin nephrotoxicity with differentials including AIN.  Low index of suspicion that this is an acute GN based on initial urinalysis/urine microscopy findings.  Had nontunneled catheter (leukocytosis) on 10/6 and initiation of HD later that night for clearance - HD today 10/8 (2nd treatment; patient's treatment moved to today 2/2 inpatient volumes)   - Continue to monitor for recovery - nonoliguric and with slight improvement in Cr 5.5 - > 4.9 prior to HD today but BUN 98 thus HD for clearance.  Mental status is improving   2. Encephalopathy -multifactorial with AKI as well as OSA, narcotics.  Mental status is improving with HD   3.  Anion gap metabolic acidosis: Secondary to acute kidney injury/SIRS from infection.  Stopped bicarb and on HD     4.  Left labial abscess versus Fournier's gangrene: Earlier CT scan suggestive of Fournier's gangrene prompting transfer to [facility] for possible surgical intervention.  On examination found to be having local labial abscess with wound care/antibiotic treatment per primary; s/p I&D on 10/6    5.  Anemia: Secondary to acute/critical illness, no overt loss.  Continue to monitor trend. aranesp started on 10/5.  Feraheme x 1 on 10/7   6.  Hyperphosphatemia: Secondary to acute kidney injury and has been on calcium acetate.  Improving with HD as well     Subjective:   Feels more awake and more conversant today.   Had 1.1 liters UOP over 10/7.  Seen and examined on HD at 2:00 with BP 130/67 and HR 70.  Procedure supervised.  nontunneled RIJ   Review of systems:   Denies shortness of breath or chest pain Urinating via foley  Denies n/v   Objective:   BP (!) (P) 141/66   Pulse (P) 67   Temp 97.6 F (36.4 C) (Oral)   Resp (P) 20   Ht 5\' 5"  (1.651 m)   Wt (!)  163.8 kg   SpO2 96%   BMI 60.09 kg/m   Intake/Output Summary (Last 24 hours) at 09/18/2019 1413 Last data filed at 09/18/2019 1200 Gross per 24 hour  Intake 720 ml  Output 1050 ml  Net -330 ml   Weight change: -0.1 kg  Physical Exam:  Gen: Morbidly obese female in bed in NAD CVS: RRR no rub  Resp: decreased breath sounds; unlabored at rest  Abd: Soft, obese, nontender, bowel sounds normal Ext:  trace to 1+ edema lower extremities  Neuro -alert and oriented; conversant and awake on arrival GU has a foley  Access RIJ nontunneled dialysis catheter  Imaging: No results found. Labs: BMET Recent Labs  Lab 09/12/19 0531 09/13/19 0311 09/14/19 0815 09/15/19 0511 09/16/19 0444 09/17/19 0609 09/18/19 0354  NA 132* 132* 132* 132* 133* 135 136  K 4.3 4.2 4.9 4.6 4.3 4.3 4.1  CL 99 98 97* 98 96* 98 99  CO2 18* 17* 17* 17* 16* 19* 20*  GLUCOSE 75 118* 83 87 94 122* 117*  BUN 77* 88* 96* 102* 104* 96* 98*  CREATININE 6.90* 7.42* 7.53* 7.30* 6.72* 5.47* 4.92*  CALCIUM 6.6* 6.5* 5.9* 5.9* 6.2* 6.9* 6.9*  PHOS 8.1* 8.2* 9.3* 9.1* 8.3* 7.0* 6.1*   CBC Recent Labs  Lab 09/14/19 0815 09/15/19 0511 09/16/19 0444 09/17/19 0947 09/18/19 0354  WBC 22.7* 21.0* 22.9* 24.7*  25.5* 20.6*  NEUTROABS 18.1* 16.7*  --  20.2*  --   HGB 8.1* 8.4* 8.8* 8.4*  8.3* 8.2*  HCT 25.1* 25.8* 26.9* 25.7*  26.6* 26.3*  MCV 85.4 85.1 84.6 86.5  87.5 88.0  PLT 343 283 281 263  285 251    Medications:    . atorvastatin  80 mg Oral QPM  . calcium acetate  1,334 mg Oral TID WC  . Chlorhexidine Gluconate Cloth  6 each Topical Daily  . Chlorhexidine Gluconate Cloth  6 each Topical Q0600  . Chlorhexidine Gluconate Cloth  6 each Topical Q0600  . [START ON 09/22/2019] darbepoetin (ARANESP) injection - NON-DIALYSIS  60 mcg Subcutaneous Q Mon-1800  . doxycycline  100 mg Oral Q12H  . feeding supplement  1 Container Oral TID BM  . feeding supplement (PRO-STAT SUGAR FREE 64)  30 mL Oral TID WC  .  heparin injection (subcutaneous)  5,000 Units Subcutaneous Q8H  . [START ON 09/19/2019] levofloxacin  500 mg Oral Q48H  . levothyroxine  125 mcg Oral Q0600  . multivitamin  1 tablet Oral QHS  . nystatin   Topical TID  . sodium chloride flush  10-40 mL Intracatheter Q12H   Claudia Desanctis 09/18/2019, 2:13 PM

## 2019-09-18 NOTE — Plan of Care (Signed)
  Problem: Education: Goal: Knowledge of General Education information will improve Description: Including pain rating scale, medication(s)/side effects and non-pharmacologic comfort measures Outcome: Progressing   Problem: Health Behavior/Discharge Planning: Goal: Ability to manage health-related needs will improve Outcome: Progressing   Problem: Elimination: Goal: Will not experience complications related to urinary retention Outcome: Progressing   Problem: Skin Integrity: Goal: Risk for impaired skin integrity will decrease Outcome: Progressing

## 2019-09-18 NOTE — Progress Notes (Signed)
OT Cancellation Note  Patient Details Name: DENEICE WACK MRN: [id number] DOB: 1956/08/26   Cancelled Treatment:    Reason Eval/Treat Not Completed: Patient at procedure or test/ unavailable. Plan to reattempt tomorrow.  Tyrone Schimke, OT Acute Rehabilitation Services Pager: [phone] Office: [phone]   09/18/2019, 1:02 PM

## 2019-09-18 NOTE — NC FL2 (Signed)
[place] MEDICAID FL2 LEVEL OF CARE SCREENING TOOL     IDENTIFICATION  Patient Name: Maureen Jackson Birthdate: 09/15/1956 Sex: female Admission Date (Current Location): 09/08/2019  [facility] and [place] Number:  Herbalist and Address:  [facility], [place] [address] [place], [place] [address]      Provider Number: [id number]  Attending Physician Name and Address:  Elmarie Shiley, MD  Relative Name and Phone Number:  Leta Bucklin [phone]    Current Level of Care: Hospital Recommended Level of Care: [place] Prior Approval Number:    Date Approved/Denied:   PASRR Number: [id number]  Discharge Plan: SNF    Current Diagnoses: Patient Active Problem List   Diagnosis Date Noted  . Acute renal failure (ARF) ([place])   . AMS (altered mental status)   . Drug rash   . Abscess   . Fistula   . Labial abscess   . Fournier's gangrene in female   . CAP (community acquired pneumonia) 09/08/2019  . Chest pain 07/16/2018  . Hyperlipidemia 04/10/2018  . TIA (transient ischemic attack) 03/14/2018  . Vertigo 03/14/2018  . Chronic combined systolic and diastolic CHF (congestive heart failure) (EF 30 to 35 %) 03/14/2018  . Ischemic cardiomyopathy 11/06/2017  . CAD, multiple vessel   . Pericardial effusion 09/07/2017  . AKI (acute kidney injury) ([place]) 09/07/2017  . Elevated troponin I level 09/07/2017  . Acute combined systolic (congestive) and diastolic (congestive) heart failure ([place]) 09/06/2017  . Cardiomegaly 09/05/2017  . Morbid obesity/BMI > 55 03/04/2013  . Labyrinthitis 03/04/2013  . Diabetes mellitus type 2 with complications, uncontrolled ([place]) 03/04/2013  . GASTRITIS 12/13/2006  . HIATAL HERNIA, HX OF 12/13/2006  . THYROIDECTOMY, HX OF 12/13/2006  . Hypothyroidism 10/04/2006  . TOBACCO ABUSE 10/04/2006  . CARPAL TUNNEL SYNDROME 10/04/2006  . Essential hypertension 10/04/2006  . GERD 10/04/2006  .  POSTMENOPAUSAL STATUS 10/04/2006  . SKIN TAG 10/04/2006  . KNEE PAIN, LEFT 10/04/2006  . Sleep apnea 10/04/2006  . LEG EDEMA 10/04/2006    Orientation RESPIRATION BLADDER Height & Weight     Self, Time, Situation, Place  Normal Incontinent Weight: (!) 159.7 kg Height:  5\' 5"  (165.1 cm)  BEHAVIORAL SYMPTOMS/MOOD NEUROLOGICAL BOWEL NUTRITION STATUS      Incontinent Diet(see DC summary)  AMBULATORY STATUS COMMUNICATION OF NEEDS Skin   Extensive Assist Verbally Skin abrasions, Other (Comment)(abscess of labia, and abcess above vagina area, wet to dry dressing)                       Personal Care Assistance Level of Assistance  Bathing, Feeding, Dressing, Total care Bathing Assistance: Maximum assistance Feeding assistance: Independent Dressing Assistance: Maximum assistance Total Care Assistance: Maximum assistance   Functional Limitations Info  Sight, Hearing, Speech Sight Info: Adequate Hearing Info: Adequate Speech Info: Adequate    SPECIAL CARE FACTORS FREQUENCY  PT (By licensed PT), OT (By licensed OT)     PT Frequency: 5x/week OT Frequency: 5x/week            Contractures Contractures Info: Not present    Additional Factors Info  Code Status, Allergies Code Status Info: Full Allergies Info: Unasyn Ampicillin-sulbactam Sodium, Aspirin           Current Medications (09/18/2019):  This is the current hospital active medication list Current Facility-Administered Medications  Medication Dose Route Frequency Provider Last Rate Last Dose  . acetaminophen (TYLENOL) tablet 650 mg  650 mg Oral Q6H PRN Coralie Keens, MD   650 mg at 09/18/19 0647  . atorvastatin (LIPITOR) tablet 80 mg  80 mg Oral QPM Coralie Keens, MD   80 mg at 09/17/19 1800  . calcium acetate (PHOSLO) capsule 1,334 mg  1,334 mg Oral TID WC Coralie Keens, MD   1,334 mg at 09/18/19 1227  . Chlorhexidine Gluconate Cloth 2 % PADS 6 each  6 each Topical Daily Coralie Keens, MD   6  each at 09/18/19 1000  . Chlorhexidine Gluconate Cloth 2 % PADS 6 each  6 each Topical Q0600 Coralie Keens, MD   6 each at 09/18/19 0654  . Chlorhexidine Gluconate Cloth 2 % PADS 6 each  6 each Topical Q0600 Claudia Desanctis, MD   6 each at 09/18/19 [phone]  . [START ON 09/22/2019] Darbepoetin Alfa (ARANESP) injection 60 mcg  60 mcg Subcutaneous Q Mon-1800 Harrie Jeans C, MD      . diphenhydrAMINE (BENADRYL) capsule 25 mg  25 mg Oral Q8H PRN Regalado, Belkys A, MD   25 mg at 09/17/19 1614  . diphenhydrAMINE-zinc acetate (BENADRYL) 2-0.1 % cream   Topical BID PRN Regalado, Belkys A, MD      . doxycycline (VIBRA-TABS) tablet 100 mg  100 mg Oral Q12H Tommy Medal, Lavell Islam, MD   100 mg at 09/18/19 0948  . feeding supplement (BOOST / RESOURCE BREEZE) liquid 1 Container  1 Container Oral TID BM Regalado, Belkys A, MD   1 Container at 09/18/19 0949  . feeding supplement (PRO-STAT SUGAR FREE 64) liquid 30 mL  30 mL Oral TID WC Regalado, Belkys A, MD   30 mL at 09/18/19 0800  . heparin 1000 UNIT/ML injection           . heparin injection 5,000 Units  5,000 Units Subcutaneous Q8H Coralie Keens, MD   5,000 Units at 09/18/19 0646  . heparin injection   Intravenous PRN Corrie Mckusick, DO   2.6 mL at 09/16/19 1303  . ipratropium-albuterol (DUONEB) 0.5-2.5 (3) MG/3ML nebulizer solution 3 mL  3 mL Nebulization Q4H PRN Coralie Keens, MD      . Derrill Memo ON 09/19/2019] levofloxacin (LEVAQUIN) tablet 500 mg  500 mg Oral Q48H Regalado, Belkys A, MD      . levothyroxine (SYNTHROID) tablet 125 mcg  125 mcg Oral Q0600 Coralie Keens, MD   125 mcg at 09/18/19 [phone]  . lidocaine (XYLOCAINE) 1 % (with pres) injection   Infiltration PRN Corrie Mckusick, DO   5 mL at 09/16/19 1303  . multivitamin (RENA-VIT) tablet 1 tablet  1 tablet Oral QHS Regalado, Belkys A, MD   1 tablet at 09/17/19 2133  . naloxone [facility]) injection 0.4 mg  0.4 mg Intravenous PRN Coralie Keens, MD      . nystatin (MYCOSTATIN/NYSTOP) topical powder    Topical TID Coralie Keens, MD      . sodium chloride 0.9 % bolus 2,000 mL  2,000 mL Intravenous Once Coralie Keens, MD      . sodium chloride flush (NS) 0.9 % injection 10-40 mL  10-40 mL Intracatheter Q12H Coralie Keens, MD   10 mL at 09/17/19 1000  . sodium chloride flush (NS) 0.9 % injection 10-40 mL  10-40 mL Intracatheter PRN Coralie Keens, MD   10 mL at 09/17/19 0945     Discharge Medications: Please see discharge summary for a list of discharge medications.  Relevant Imaging Results:  Relevant Lab Results:   Additional Information soc sec [phone]  Zenon Mayo, RN

## 2019-09-19 LAB — GLUCOSE, CAPILLARY
Glucose-Capillary: 142 mg/dL — ABNORMAL HIGH (ref 70–99)
Glucose-Capillary: 136 mg/dL — ABNORMAL HIGH (ref 70–99)
Glucose-Capillary: 136 mg/dL — ABNORMAL HIGH (ref 70–99)
Glucose-Capillary: 169 mg/dL — ABNORMAL HIGH (ref 70–99)
Glucose-Capillary: 165 mg/dL — ABNORMAL HIGH (ref 70–99)

## 2019-09-19 LAB — CBC
HCT: 26.9 % — ABNORMAL LOW (ref 36.0–46.0)
Hemoglobin: 8.1 g/dL — ABNORMAL LOW (ref 12.0–15.0)
MCH: 26.9 pg (ref 26.0–34.0)
MCHC: 30.1 g/dL (ref 30.0–36.0)
MCV: 89.4 fL (ref 80.0–100.0)
Platelets: 226 10*3/uL (ref 150–400)
RBC: 3.01 MIL/uL — ABNORMAL LOW (ref 3.87–5.11)
RDW: 14.7 % (ref 11.5–15.5)
WBC: 17.2 10*3/uL — ABNORMAL HIGH (ref 4.0–10.5)
nRBC: 0 % (ref 0.0–0.2)

## 2019-09-19 MED ORDER — INSULIN ASPART 100 UNIT/ML ~~LOC~~ SOLN
0.0000 [IU] | Freq: Three times a day (TID) | SUBCUTANEOUS | Status: DC
  Administered 2019-09-19 – 2019-09-20 (×4): 2 [IU] via SUBCUTANEOUS
  Administered 2019-09-21 – 2019-09-22 (×4): 1 [IU] via SUBCUTANEOUS
  Administered 2019-09-23: 2 [IU] via SUBCUTANEOUS
  Administered 2019-09-23: 3 [IU] via SUBCUTANEOUS
  Administered 2019-09-24: 1 [IU] via SUBCUTANEOUS
  Administered 2019-09-24: 3 [IU] via SUBCUTANEOUS
  Administered 2019-09-25 (×3): 2 [IU] via SUBCUTANEOUS
  Administered 2019-09-26: 5 [IU] via SUBCUTANEOUS
  Administered 2019-09-26: 7 [IU] via SUBCUTANEOUS
  Administered 2019-09-26 – 2019-09-27 (×2): 2 [IU] via SUBCUTANEOUS
  Administered 2019-09-27: 3 [IU] via SUBCUTANEOUS
  Administered 2019-09-27: 5 [IU] via SUBCUTANEOUS
  Administered 2019-09-28 (×3): 2 [IU] via SUBCUTANEOUS
  Administered 2019-09-29 (×3): 1 [IU] via SUBCUTANEOUS
  Administered 2019-09-30 – 2019-10-02 (×7): 2 [IU] via SUBCUTANEOUS

## 2019-09-19 NOTE — Evaluation (Signed)
Occupational Therapy Evaluation Patient Details Name: Maureen Jackson MRN: [id number] DOB: 01/18/1956 Today's Date: 09/19/2019    History of Present Illness 63 year old with past medical history significant for morbid obesity, systolic heart failure, hypothyroidism, hypertension, CAD and obstructive sleep apnea. To AP H 09/08/19 weakness, difficulty voiding, poor appetite and insomnia for 3 days. S/P I and D in ED 9/25 for r left labia upper thigh abscess .  Patient had worsening renal function in the setting of concern for Fournier gangrene, patient was transferred to [facility] for further management. S/P R nontineelled HD catheter, S/P I and D labia abcess 10/6   Clinical Impression    Pt. Was seen for skilled OT to maximize I and safety. Pt. Is a hoyer for transfer and is requiring extensive assist with ADLs. Pt. Will need SNF at  Dc.     Follow Up Recommendations  SNF    Equipment Recommendations       Recommendations for Other Services       Precautions / Restrictions Precautions Precautions: Fall Restrictions Weight Bearing Restrictions: No      Mobility Bed Mobility Overal bed mobility: Needs Assistance Bed Mobility: Rolling Rolling: Mod assist            Transfers                 General transfer comment: houer    Balance                                           ADL either performed or assessed with clinical judgement   ADL Overall ADL's : Needs assistance/impaired Eating/Feeding: Independent   Grooming: Minimal assistance   Upper Body Bathing: Moderate assistance   Lower Body Bathing: Total assistance;Bed level   Upper Body Dressing : Moderate assistance   Lower Body Dressing: Total assistance;+2 for physical assistance   Toilet Transfer: Total assistance;+2 for physical assistance;+2 for safety/equipment   Toileting- Clothing Manipulation and Hygiene: Total assistance;+2 for physical assistance       Functional  mobility during ADLs: (hoyer lift) General ADL Comments: extensive assit with adls     Vision Baseline Vision/History: Wears glasses       Perception     Praxis      Pertinent Vitals/Pain Pain Assessment: No/denies pain     Hand Dominance Right   Extremity/Trunk Assessment Upper Extremity Assessment Upper Extremity Assessment: Generalized weakness           Communication Communication Communication: No difficulties   Cognition Arousal/Alertness: Awake/alert                                         General Comments       Exercises     Shoulder Instructions      Home Living Family/patient expects to be discharged to:: Private residence Living Arrangements: Children Available Help at Discharge: Friend(s);Family   Home Access: Level entry     Home Layout: One level     Bathroom Shower/Tub: Teacher, early years/pre: Standard                Prior Functioning/Environment                   OT Problem List:  OT Treatment/Interventions:      OT Goals(Current goals can be found in the care plan section)    OT Frequency:     Barriers to D/C:            Co-evaluation PT/OT/SLP Co-Evaluation/Treatment: Yes     OT goals addressed during session: ADL's and self-care      AM-PAC OT "6 Clicks" Daily Activity     Outcome Measure Help from another person eating meals?: None Help from another person taking care of personal grooming?: A Little Help from another person toileting, which includes using toliet, bedpan, or urinal?: Total Help from another person bathing (including washing, rinsing, drying)?: Total Help from another person to put on and taking off regular upper body clothing?: A Lot Help from another person to put on and taking off regular lower body clothing?: Total 6 Click Score: 12   End of Session    Activity Tolerance:   Patient left:                     Time: 0712-1975 OT Time  Calculation (min): 30 min Charges:     6 clicks Zaden Sako 09/19/2019, 3:00 PM

## 2019-09-19 NOTE — Progress Notes (Signed)
Subjective:  Still itching Antibiotics:  Anti-infectives (From admission, onward)   Start     Dose/Rate Route Frequency Ordered Stop   09/19/19 1600  levofloxacin (LEVAQUIN) tablet 500 mg     500 mg Oral Every 48 hours 09/18/19 1118     09/17/19 1600  levofloxacin (LEVAQUIN) tablet 750 mg  Status:  Discontinued     750 mg Oral Every 48 hours 09/17/19 1100 09/18/19 1118   09/17/19 1600  doxycycline (VIBRA-TABS) tablet 100 mg     100 mg Oral Every 12 hours 09/17/19 1100     09/16/19 1600  linezolid (ZYVOX) IVPB 600 mg  Status:  Discontinued     600 mg 300 mL/hr over 60 Minutes Intravenous Every 12 hours 09/16/19 1556 09/17/19 1100   09/12/19 1100  linezolid (ZYVOX) IVPB 600 mg  Status:  Discontinued     600 mg 300 mL/hr over 60 Minutes Intravenous Every 12 hours 09/12/19 0945 09/16/19 1556   09/12/19 1100  Ampicillin-Sulbactam (UNASYN) 3 g in sodium chloride 0.9 % 100 mL IVPB  Status:  Discontinued     3 g 200 mL/hr over 30 Minutes Intravenous Every 12 hours 09/12/19 0945 09/17/19 1100   09/10/19 1430  clindamycin (CLEOCIN) IVPB 600 mg  Status:  Discontinued     600 mg 100 mL/hr over 30 Minutes Intravenous Every 6 hours 09/10/19 1410 09/12/19 0945   09/10/19 0742  vancomycin variable dose per unstable renal function (pharmacist dosing)  Status:  Discontinued      Does not apply See admin instructions 09/10/19 0742 09/10/19 1413   09/10/19 0000  vancomycin (VANCOCIN) 1,750 mg in sodium chloride 0.9 % 500 mL IVPB  Status:  Discontinued     1,750 mg 250 mL/hr over 120 Minutes Intravenous Every 24 hours 09/09/19 0912 09/09/19 0928   09/10/19 0000  vancomycin (VANCOCIN) 1,750 mg in sodium chloride 0.9 % 500 mL IVPB  Status:  Discontinued     1,750 mg 250 mL/hr over 120 Minutes Intravenous Every 24 hours 09/09/19 0940 09/10/19 0751   09/09/19 1030  vancomycin (VANCOCIN) IVPB 750 mg/150 ml premix  Status:  Discontinued     750 mg 150 mL/hr over 60 Minutes Intravenous  Once  09/09/19 0928 09/09/19 0938   09/09/19 0930  vancomycin (VANCOCIN) IVPB 1000 mg/200 mL premix  Status:  Discontinued     1,000 mg 200 mL/hr over 60 Minutes Intravenous  Once 09/09/19 0928 09/09/19 0938   09/08/19 2300  azithromycin (ZITHROMAX) 500 mg in sodium chloride 0.9 % 250 mL IVPB  Status:  Discontinued     500 mg 250 mL/hr over 60 Minutes Intravenous Every 24 hours 09/08/19 2230 09/12/19 0945   09/08/19 2300  cefTRIAXone (ROCEPHIN) 2 g in sodium chloride 0.9 % 100 mL IVPB  Status:  Discontinued     2 g 200 mL/hr over 30 Minutes Intravenous Every 24 hours 09/08/19 2230 09/12/19 0945   09/08/19 2300  vancomycin (VANCOCIN) 1,500 mg in sodium chloride 0.9 % 500 mL IVPB     1,500 mg 250 mL/hr over 120 Minutes Intravenous  Once 09/08/19 2254 09/09/19 0434   09/08/19 1600  vancomycin (VANCOCIN) IVPB 1000 mg/200 mL premix     1,000 mg 200 mL/hr over 60 Minutes Intravenous  Once 09/08/19 1558 09/08/19 1929   09/08/19 1600  piperacillin-tazobactam (ZOSYN) IVPB 3.375 g     3.375 g 100 mL/hr over 30 Minutes Intravenous  Once 09/08/19 1558 09/08/19 1655  Medications: Scheduled Meds: . atorvastatin  80 mg Oral QPM  . calcium acetate  1,334 mg Oral TID WC  . Chlorhexidine Gluconate Cloth  6 each Topical Daily  . Chlorhexidine Gluconate Cloth  6 each Topical Q0600  . Chlorhexidine Gluconate Cloth  6 each Topical Q0600  . [START ON 09/22/2019] darbepoetin (ARANESP) injection - NON-DIALYSIS  60 mcg Subcutaneous Q Mon-1800  . doxycycline  100 mg Oral Q12H  . feeding supplement  1 Container Oral TID BM  . feeding supplement (PRO-STAT SUGAR FREE 64)  30 mL Oral TID WC  . heparin injection (subcutaneous)  5,000 Units Subcutaneous Q8H  . levofloxacin  500 mg Oral Q48H  . levothyroxine  125 mcg Oral Q0600  . multivitamin  1 tablet Oral QHS  . nystatin   Topical TID  . sodium chloride flush  10-40 mL Intracatheter Q12H   Continuous Infusions: . sodium chloride     PRN  Meds:.acetaminophen, diphenhydrAMINE, diphenhydrAMINE-zinc acetate, heparin, ipratropium-albuterol, lidocaine, naloxone, sodium chloride flush    Objective: Weight change: -1 kg  Intake/Output Summary (Last 24 hours) at 09/19/2019 1332 Last data filed at 09/19/2019 1248 Gross per 24 hour  Intake 1100 ml  Output 2925 ml  Net -1825 ml   Blood pressure (!) 116/47, pulse 71, temperature (!) 97.5 F (36.4 C), temperature source Oral, resp. rate 17, height 5\' 5"  (1.651 m), weight (!) 161.7 kg, SpO2 96 %. Temp:  [97.5 F (36.4 C)-98.3 F (36.8 C)] 97.5 F (36.4 C) (10/09 1120) Pulse Rate:  [66-74] 71 (10/09 1120) Resp:  [16-18] 17 (10/09 1120) BP: (116-154)/(47-112) 116/47 (10/09 1120) SpO2:  [96 %-98 %] 96 % (10/09 1120) Weight:  [159.7 kg-161.7 kg] 161.7 kg (10/09 0210)  Physical Exam: General still fairly confused CVS regular rate, normal  Chest: , no wheezing, no respiratory distress Abdomen: soft non-distended,  She pus draining from labial abscess, urine seems more clear today Neuro: nonfocal  CBC:    BMET Recent Labs    09/17/19 0609 09/18/19 0354  NA 135 136  K 4.3 4.1  CL 98 99  CO2 19* 20*  GLUCOSE 122* 117*  BUN 96* 98*  CREATININE 5.47* 4.92*  CALCIUM 6.9* 6.9*     Liver Panel  Recent Labs    09/17/19 0609 09/18/19 0354  ALBUMIN 1.4* 1.4*       Sedimentation Rate No results for input(s): ESRSEDRATE in the last 72 hours. C-Reactive Protein No results for input(s): CRP in the last 72 hours.  Micro Results: Recent Results (from the past 720 hour(s))  Urine culture     Status: Abnormal   Collection Time: 09/08/19  3:58 PM   Specimen: In/Out Cath Urine  Result Value Ref Range Status   Specimen Description   Final    IN/OUT CATH URINE Performed at [facility], [address] [place], [place] [address]    Special Requests   Final    NONE Performed at [facility], [address] [place], [place] [address]    Culture >=100,000  COLONIES/mL YEAST (A)  Final   Report Status 09/10/2019 FINAL  Final  SARS Coronavirus 2 [facility] order, Performed in [facility] hospital lab) Nasopharyngeal Nasopharyngeal Swab     Status: None   Collection Time: 09/08/19  3:59 PM   Specimen: Nasopharyngeal Swab  Result Value Ref Range Status   SARS Coronavirus 2 NEGATIVE NEGATIVE Final    Comment: (NOTE) If result is NEGATIVE SARS-CoV-2 target nucleic acids are NOT DETECTED. The SARS-CoV-2 RNA  is generally detectable in upper and lower  respiratory specimens during the acute phase of infection. The lowest  concentration of SARS-CoV-2 viral copies this assay can detect is 250  copies / mL. A negative result does not preclude SARS-CoV-2 infection  and should not be used as the sole basis for treatment or other  patient management decisions.  A negative result may occur with  improper specimen collection / handling, submission of specimen other  than nasopharyngeal swab, presence of viral mutation(s) within the  areas targeted by this assay, and inadequate number of viral copies  (<250 copies / mL). A negative result must be combined with clinical  observations, patient history, and epidemiological information. If result is POSITIVE SARS-CoV-2 target nucleic acids are DETECTED. The SARS-CoV-2 RNA is generally detectable in upper and lower  respiratory specimens dur ing the acute phase of infection.  Positive  results are indicative of active infection with SARS-CoV-2.  Clinical  correlation with patient history and other diagnostic information is  necessary to determine patient infection status.  Positive results do  not rule out bacterial infection or co-infection with other viruses. If result is PRESUMPTIVE POSTIVE SARS-CoV-2 nucleic acids MAY BE PRESENT.   A presumptive positive result was obtained on the submitted specimen  and confirmed on repeat testing.  While 2019 novel coronavirus  (SARS-CoV-2) nucleic acids may be present  in the submitted sample  additional confirmatory testing may be necessary for epidemiological  and / or clinical management purposes  to differentiate between  SARS-CoV-2 and other Sarbecovirus currently known to infect humans.  If clinically indicated additional testing with an alternate test  methodology [phone]) is advised. The SARS-CoV-2 RNA is generally  detectable in upper and lower respiratory sp ecimens during the acute  phase of infection. The expected result is Negative. Fact Sheet for Patients:  StrictlyIdeas.no Fact Sheet for Healthcare Providers: BankingDealers.co.za This test is not yet approved or cleared by the [place] FDA and has been authorized for detection and/or diagnosis of SARS-CoV-2 by FDA under an Emergency Use Authorization (EUA).  This EUA will remain in effect (meaning this test can be used) for the duration of the COVID-19 declaration under Section 564(b)(1) of the Act, 21 U.S.C. section 360bbb-3(b)(1), unless the authorization is terminated or revoked sooner. Performed at [facility], [address], [place], [place] [address]   Blood Culture (routine x 2)     Status: None   Collection Time: 09/08/19  4:07 PM   Specimen: BLOOD RIGHT WRIST  Result Value Ref Range Status   Specimen Description BLOOD RIGHT WRIST  Final   Special Requests   Final    BOTTLES DRAWN AEROBIC AND ANAEROBIC Blood Culture adequate volume   Culture   Final    NO GROWTH 5 DAYS Performed at [facility], [address], [place], [place] [address]    Report Status 09/13/2019 FINAL  Final  Blood Culture (routine x 2)     Status: None   Collection Time: 09/08/19  4:17 PM   Specimen: BLOOD LEFT HAND  Result Value Ref Range Status   Specimen Description BLOOD LEFT HAND  Final   Special Requests   Final    BOTTLES DRAWN AEROBIC AND ANAEROBIC Blood Culture adequate volume   Culture   Final    NO GROWTH 5 DAYS Performed at [facility], [address], [place], [place] [address]    Report Status 09/13/2019 FINAL  Final    Studies/Results: No results found.  Assessment/Plan:  INTERVAL HISTORY  Has a new abscess but surgery not planning on doing any intervention at this point in time.   Principal Problem:   Fournier's gangrene in female Active Problems:   Hypothyroidism   Essential hypertension   Morbid obesity/BMI > 55   Diabetes mellitus type 2 with complications, uncontrolled (HCC)   CAD, multiple vessel   Ischemic cardiomyopathy   Chronic combined systolic and diastolic CHF (congestive heart failure) (EF 30 to 35 %)   CAP (community acquired pneumonia)   Labial abscess   Abscess   Fistula   AMS (altered mental status)   Drug rash   Acute renal failure (ARF) (HCC)    Maureen Jackson is a 63 y.o. female with Dubiel abscess with Fournier's and sepsis that is post I&D by surgery but no cultures been sent she had a rash which prompted [place] changing her thumb Unasyn and Zyvox to levofloxacin and doxycycline.  CHG baths also been stopped in case they are causing the rash a new abscess was discovered but surgery not planning on intervention at this point in time  #1 Labial abscess with concern for Fournier's,    --- Tinea Levaquin and doxycycline to complete a postoperative course of therapy of 10 days  #2 presumed drug rash: Hopefully this will continue to improve we have described it to be likely due to the beta-lactam but the baths could be a culprit.   #3ARF: going on HD now.  We will sign off for now please call if further questions.      LOS: 11 days   Alcide Evener 09/19/2019, 1:32 PM

## 2019-09-19 NOTE — Care Management Important Message (Signed)
Important Message  Patient Details  Name: Maureen Jackson MRN: [id number] Date of Birth: October 16, 1956   Medicare Important Message Given:  Yes     Shelda Altes 09/19/2019, 1:02 PM

## 2019-09-19 NOTE — Progress Notes (Signed)
3 Days Post-Op   Subjective/Chief Complaint: No complaints No acute changes   Objective: Vital signs in last 24 hours: Temp:  [97.6 F (36.4 C)-98.2 F (36.8 C)] 97.6 F (36.4 C) (10/09 0756) Pulse Rate:  [66-77] 71 (10/09 0756) Resp:  [16-20] 16 (10/09 0756) BP: (120-154)/(56-112) 142/66 (10/09 0756) SpO2:  [96 %-98 %] 96 % (10/09 0756) Weight:  [159.7 kg-162.8 kg] 161.7 kg (10/09 0210) Last BM Date: 09/18/19  Intake/Output from previous day: 10/08 0701 - 10/09 0700 In: 960 [P.O.:960] Out: 2625 [Urine:1125] Intake/Output this shift: Total I/O In: 10 [I.V.:10] Out: -   Exam: Mons and groin wounds dressing changed,  Only mild fat necrosis, no odor, no deep purulence.  Tenderness less  Lab Results:  Recent Labs    09/17/19 0947 09/18/19 0354  WBC 24.7*  25.5* 20.6*  HGB 8.4*  8.3* 8.2*  HCT 25.7*  26.6* 26.3*  PLT 263  285 251   BMET Recent Labs    09/17/19 0609 09/18/19 0354  NA 135 136  K 4.3 4.1  CL 98 99  CO2 19* 20*  GLUCOSE 122* 117*  BUN 96* 98*  CREATININE 5.47* 4.92*  CALCIUM 6.9* 6.9*   PT/INR No results for input(s): LABPROT, INR in the last 72 hours. ABG No results for input(s): PHART, HCO3 in the last 72 hours.  Invalid input(s): PCO2, PO2  Studies/Results: No results found.  Anti-infectives: Anti-infectives (From admission, onward)   Start     Dose/Rate Route Frequency Ordered Stop   09/19/19 1600  levofloxacin (LEVAQUIN) tablet 500 mg     500 mg Oral Every 48 hours 09/18/19 1118     09/17/19 1600  levofloxacin (LEVAQUIN) tablet 750 mg  Status:  Discontinued     750 mg Oral Every 48 hours 09/17/19 1100 09/18/19 1118   09/17/19 1600  doxycycline (VIBRA-TABS) tablet 100 mg     100 mg Oral Every 12 hours 09/17/19 1100     09/16/19 1600  linezolid (ZYVOX) IVPB 600 mg  Status:  Discontinued     600 mg 300 mL/hr over 60 Minutes Intravenous Every 12 hours 09/16/19 1556 09/17/19 1100   09/12/19 1100  linezolid (ZYVOX) IVPB 600 mg   Status:  Discontinued     600 mg 300 mL/hr over 60 Minutes Intravenous Every 12 hours 09/12/19 0945 09/16/19 1556   09/12/19 1100  Ampicillin-Sulbactam (UNASYN) 3 g in sodium chloride 0.9 % 100 mL IVPB  Status:  Discontinued     3 g 200 mL/hr over 30 Minutes Intravenous Every 12 hours 09/12/19 0945 09/17/19 1100   09/10/19 1430  clindamycin (CLEOCIN) IVPB 600 mg  Status:  Discontinued     600 mg 100 mL/hr over 30 Minutes Intravenous Every 6 hours 09/10/19 1410 09/12/19 0945   09/10/19 0742  vancomycin variable dose per unstable renal function (pharmacist dosing)  Status:  Discontinued      Does not apply See admin instructions 09/10/19 0742 09/10/19 1413   09/10/19 0000  vancomycin (VANCOCIN) 1,750 mg in sodium chloride 0.9 % 500 mL IVPB  Status:  Discontinued     1,750 mg 250 mL/hr over 120 Minutes Intravenous Every 24 hours 09/09/19 0912 09/09/19 0928   09/10/19 0000  vancomycin (VANCOCIN) 1,750 mg in sodium chloride 0.9 % 500 mL IVPB  Status:  Discontinued     1,750 mg 250 mL/hr over 120 Minutes Intravenous Every 24 hours 09/09/19 0940 09/10/19 0751   09/09/19 1030  vancomycin (VANCOCIN) IVPB 750 mg/150 ml premix  Status:  Discontinued     750 mg 150 mL/hr over 60 Minutes Intravenous  Once 09/09/19 0928 09/09/19 0938   09/09/19 0930  vancomycin (VANCOCIN) IVPB 1000 mg/200 mL premix  Status:  Discontinued     1,000 mg 200 mL/hr over 60 Minutes Intravenous  Once 09/09/19 0928 09/09/19 0938   09/08/19 2300  azithromycin (ZITHROMAX) 500 mg in sodium chloride 0.9 % 250 mL IVPB  Status:  Discontinued     500 mg 250 mL/hr over 60 Minutes Intravenous Every 24 hours 09/08/19 2230 09/12/19 0945   09/08/19 2300  cefTRIAXone (ROCEPHIN) 2 g in sodium chloride 0.9 % 100 mL IVPB  Status:  Discontinued     2 g 200 mL/hr over 30 Minutes Intravenous Every 24 hours 09/08/19 2230 09/12/19 0945   09/08/19 2300  vancomycin (VANCOCIN) 1,500 mg in sodium chloride 0.9 % 500 mL IVPB     1,500 mg 250 mL/hr  over 120 Minutes Intravenous  Once 09/08/19 2254 09/09/19 0434   09/08/19 1600  vancomycin (VANCOCIN) IVPB 1000 mg/200 mL premix     1,000 mg 200 mL/hr over 60 Minutes Intravenous  Once 09/08/19 1558 09/08/19 1929   09/08/19 1600  piperacillin-tazobactam (ZOSYN) IVPB 3.375 g     3.375 g 100 mL/hr over 30 Minutes Intravenous  Once 09/08/19 1558 09/08/19 1655      Assessment/Plan: s/p Procedure(s): IRRIGATION AND DEBRIDEMENT LABIA ABSCESS (Left)  Large mons/groin wound  Does not need to go back to the OR Will continue wound care Resume diet   LOS: 11 days    Coralie Keens 09/19/2019

## 2019-09-19 NOTE — Plan of Care (Signed)
  Problem: Education: Goal: Knowledge of General Education information will improve Description: Including pain rating scale, medication(s)/side effects and non-pharmacologic comfort measures Outcome: Progressing   Problem: Health Behavior/Discharge Planning: Goal: Ability to manage health-related needs will improve Outcome: Progressing   Problem: Coping: Goal: Level of anxiety will decrease Outcome: Progressing   Problem: Skin Integrity: Goal: Risk for impaired skin integrity will decrease Outcome: Progressing   

## 2019-09-19 NOTE — Progress Notes (Signed)
Patient refused CPAP.

## 2019-09-19 NOTE — Plan of Care (Signed)
Pt. Was seen for skilled OT to maximize I and safety. Pt. Is a hoyer for transfer and is requiring extensive assist with ADLs. Pt. Will need SNF at  Dc.

## 2019-09-19 NOTE — Progress Notes (Signed)
Physical Therapy Treatment Patient Details Name: Maureen Jackson MRN: [id number] DOB: 09/07/1956 Today's Date: 09/19/2019    History of Present Illness Pt is a 63 y.o. female admitted 09/08/19 with weakness, difficulty voiding and poor appetite. Had recent I&D of left labia upper thigh abscess in ED on 9/26. Pt with worsening renal function. CT 10/5 with concern for Fournier gangrene. S/p RIJ non-tunneled cath placement. S/p I&D of labia abscess 10/6. PMH includes morbid obesity, HF, HTN, CAD, OSA.   PT Comments    Pt requiring assist+2 for bed mobility and transfer to recliner with use of maximove lift; tolerated this well. Pt reports motivated to get out of bed, but requires max encouragement to engage in physical activity. Discussed importance of mobility in order to regain PLOF. Continue to recommend SNF-level therapies.   Follow Up Recommendations  SNF     Equipment Recommendations  Wheelchair (measurements PT);Wheelchair cushion (measurements PT)    Recommendations for Other Services       Precautions / Restrictions Precautions Precautions: Fall Precaution Comments: R IJ HD catheter, abscess L thigh & labia Restrictions Weight Bearing Restrictions: No    Mobility  Bed Mobility Overal bed mobility: Needs Assistance Bed Mobility: Rolling Rolling: Max assist         General bed mobility comments: Max cues for pt to participate and for sequencing, maxA to assist trunk and BLEs with rolling, cued to use hand rail; pt unable to hold position without assist  Transfers Overall transfer level: Needs assistance               General transfer comment: Assist+2 for maximove transfer from bed to recliner; pt tolerated well  Ambulation/Gait                 Stairs             Wheelchair Mobility    Modified Rankin (Stroke Patients Only)       Balance                                            Cognition Arousal/Alertness:  Awake/alert Behavior During Therapy: Flat affect Overall Cognitive Status: No family/caregiver present to determine baseline cognitive functioning Area of Impairment: Attention;Following commands;Safety/judgement;Problem solving                   Current Attention Level: Selective   Following Commands: Follows one step commands inconsistently Safety/Judgement: Decreased awareness of deficits Awareness: Emergent Problem Solving: Decreased initiation;Requires verbal cues General Comments: Generally flat affect with minimal participation, decreased engagement; lightened up talking on phone with daughter      Exercises      General Comments        Pertinent Vitals/Pain Pain Assessment: No/denies pain Pain Intervention(s): Monitored during session    [place] expects to be discharged to:: Private residence Living Arrangements: Children Available Help at Discharge: Friend(s);Family   Home Access: Level entry   Home Layout: One level        Prior Function            PT Goals (current goals can now be found in the care plan section) Progress towards PT goals: Progressing toward goals    Frequency    Min 2X/week      PT Plan Current plan remains appropriate    Co-evaluation PT/OT/SLP Co-Evaluation/Treatment: Yes Reason for Co-Treatment: For  patient/therapist safety;To address functional/ADL transfers PT goals addressed during session: Mobility/safety with mobility OT goals addressed during session: ADL's and self-care      AM-PAC PT "6 Clicks" Mobility   Outcome Measure  Help needed turning from your back to your side while in a flat bed without using bedrails?: A Lot Help needed moving from lying on your back to sitting on the side of a flat bed without using bedrails?: A Lot Help needed moving to and from a bed to a chair (including a wheelchair)?: Total Help needed standing up from a chair using your arms (e.g., wheelchair or  bedside chair)?: Total Help needed to walk in hospital room?: Total Help needed climbing 3-5 steps with a railing? : Total 6 Click Score: 8    End of Session   Activity Tolerance: Patient tolerated treatment well;Patient limited by fatigue Patient left: in chair;with call bell/phone within reach;with chair alarm set Nurse Communication: Mobility status;Need for lift equipment PT Visit Diagnosis: Unsteadiness on feet (R26.81);Muscle weakness (generalized) (M62.81);Pain     Time: 2233-6122 PT Time Calculation (min) (ACUTE ONLY): 30 min  Charges:  $Therapeutic Activity: 8-22 mins                    Mabeline Caras, PT, DPT Acute Rehabilitation Services  Pager [phone] Office [phone]  Derry Lory 09/19/2019, 3:26 PM

## 2019-09-19 NOTE — Progress Notes (Signed)
PROGRESS NOTE    Maureen Jackson  OHY:[id number] DOB: 1956-09-11 DOA: 09/08/2019 PCP: Neale Burly, MD   Brief Narrative: 63 year old with past medical history significant for morbid obesity, systolic heart failure, hypothyroidism, hypertension, CAD and obstructive sleep apnea.  Presents with  generalized weakness, difficulty voiding, poor appetite and insomnia for 3 days.  September 26,  2 days prior to hospitalization she was seen in the emergency department for left labia upper thigh abscess which was drained.  Discharge home on oral antibiotics.  Patient had worsening renal function in the setting of concern for Fournier gangrene, patient was transferred from [place]  to [facility] for further management.    Assessment & Plan:   Principal Problem:   Fournier's gangrene in female Active Problems:   Hypothyroidism   Essential hypertension   Morbid obesity/BMI > 55   Diabetes mellitus type 2 with complications, uncontrolled (HCC)   CAD, multiple vessel   Ischemic cardiomyopathy   Chronic combined systolic and diastolic CHF (congestive heart failure) (EF 30 to 35 %)   CAP (community acquired pneumonia)   Labial abscess   Abscess   Fistula   AMS (altered mental status)   Drug rash   Acute renal failure (ARF) (HCC)  1-Labial abscess/Fournier gangrene complicated with sepsis/acute metabolic encephalopathy -Patient presented with leukocytosis, fever post sediment in F[place].  Repeat CT scan done on 10/5 findings worrisome for Fournier gangrene: Patient was taken to the OR by general surgery on 10/6. -ID has been consulted and has been helping with antibiotics: -Patient developed rash today, antibiotics has been changed from Zyvox, Unasyn  to Levaquin, and Doxycycline.  -Surgery recommending wet-to-dry dressing twice a day -per surgery no need to go to OR>  -continue with levaquin and doxy.   2-Rash; Benadryl cream and oral benadryl PRN.  Unasyn and Zyvox discontinue.  Plan to  stop  CHG baths. Stable, not worse.   3-AKI;, volume overload non-anion gap metabolic acidosis.  Suspected acute tubular necrosis, oliguric hypocalcemia hyperphosphatemia: Nephrology consulted, patient was a started on hemodialysis on 10/6 HD on 10-08. HD again 10/10.  4-Acute on chronic systolic heart failure exacerbation, ejection fraction 30 to 35%: Ischemic cardiomyopathy status post DES to LAD and RCA 2018.  Volume control with hemodialysis Holding antiplatelet due to surgical procedure resume when is okay by surgery  5-Diabetes: Continue with sliding scale insulin  Morbid obesity with OSA: Continue with CPAP Body mass index 60  Hypothyroidism continue with Synthroid Acute metabolic encephalopathy: Related to acute illness renal failure sepsis infection.   Less confuse, less lethargic today.    Nutrition Problem: Inadequate oral intake Etiology: acute illness(pneumonia; abscess in left labia/upper thigh area)    Signs/Symptoms: meal completion < 25%    Interventions: Ensure Enlive (each supplement provides 350kcal and 20 grams of protein), MVI  Estimated body mass index is 59.32 kg/m as calculated from the following:   Height as of this encounter: 5\' 5"  (1.651 m).   Weight as of this encounter: 161.7 kg.   DVT prophylaxis: Lovenox Code Status: Full code Family Communication: Family at bedside Disposition Plan: remain in hospital for HD, renal failure, Consultants:   surgery  ID  Procedures:   Irrigation and debridement months abscess 6 x 6 by day 10/6  Antimicrobials:  Levaquin and doxy Received Zyvox, Unasyn  Subjective: Alert, oriented times 3. Denies pain.    Objective: Vitals:   09/19/19 0653 09/19/19 0756 09/19/19 1104 09/19/19 1120  BP: (!) 144/65 (!) 142/66 Marland Kitchen)  118/56 (!) 116/47  Pulse: 68 71 66 71  Resp: 18 16 17 17   Temp: 98.2 F (36.8 C) 97.6 F (36.4 C) 98.3 F (36.8 C) (!) 97.5 F (36.4 C)  TempSrc: Oral Oral Oral Oral  SpO2:  97% 96% 98% 96%  Weight:      Height:        Intake/Output Summary (Last 24 hours) at 09/19/2019 1402 Last data filed at 09/19/2019 1248 Gross per 24 hour  Intake 1100 ml  Output 2925 ml  Net -1825 ml   Filed Weights   09/18/19 1252 09/18/19 1458 09/19/19 0210  Weight: (!) 162.8 kg (!) 159.7 kg (!) 161.7 kg    Examination:  General exam: Alert, morbid obese.  Respiratory system: CTA Cardiovascular system: S, 1 S 2, RRR Gastrointestinal system: BS present, dressing pubic area.  Central nervous system: alert, following command Extremities: trace edenma Skin:  Rash abdominal wall    Data Reviewed: I have personally reviewed following labs and imaging studies  CBC: Recent Labs  Lab 09/14/19 0815 09/15/19 0511 09/16/19 0444 09/17/19 0947 09/18/19 0354 09/19/19 1125  WBC 22.7* 21.0* 22.9* 24.7*   25.5* 20.6* 17.2*  NEUTROABS 18.1* 16.7*  --  20.2*  --   --   HGB 8.1* 8.4* 8.8* 8.4*   8.3* 8.2* 8.1*  HCT 25.1* 25.8* 26.9* 25.7*   26.6* 26.3* 26.9*  MCV 85.4 85.1 84.6 86.5   87.5 88.0 89.4  PLT 343 283 281 263   285 251 220   Basic Metabolic Panel: Recent Labs  Lab 09/14/19 0815 09/15/19 0511 09/16/19 0444 09/17/19 0609 09/18/19 0354  NA 132* 132* 133* 135 136  K 4.9 4.6 4.3 4.3 4.1  CL 97* 98 96* 98 99  CO2 17* 17* 16* 19* 20*  GLUCOSE 83 87 94 122* 117*  BUN 96* 102* 104* 96* 98*  CREATININE 7.53* 7.30* 6.72* 5.47* 4.92*  CALCIUM 5.9* 5.9* 6.2* 6.9* 6.9*  PHOS 9.3* 9.1* 8.3* 7.0* 6.1*   GFR: Estimated Creatinine Clearance: 18.7 mL/min (A) (by C-G formula based on SCr of 4.92 mg/dL (H)). Liver Function Tests: Recent Labs  Lab 09/14/19 0815 09/15/19 0511 09/16/19 0444 09/17/19 0609 09/18/19 0354  ALBUMIN 1.3* 1.5* 1.5* 1.4* 1.4*   No results for input(s): LIPASE, AMYLASE in the last 168 hours. No results for input(s): AMMONIA in the last 168 hours. Coagulation Profile: No results for input(s): INR, PROTIME in the last 168 hours. Cardiac  Enzymes: No results for input(s): CKTOTAL, CKMB, CKMBINDEX, TROPONINI in the last 168 hours. BNP (last 3 results) No results for input(s): PROBNP in the last 8760 hours. HbA1C: No results for input(s): HGBA1C in the last 72 hours. CBG: Recent Labs  Lab 09/18/19 1602 09/18/19 2126 09/19/19 0650 09/19/19 0901 09/19/19 1106  GLUCAP 123* 132* 142* 136* 136*   Lipid Profile: No results for input(s): CHOL, HDL, LDLCALC, TRIG, CHOLHDL, LDLDIRECT in the last 72 hours. Thyroid Function Tests: No results for input(s): TSH, T4TOTAL, FREET4, T3FREE, THYROIDAB in the last 72 hours. Anemia Panel: No results for input(s): VITAMINB12, FOLATE, FERRITIN, TIBC, IRON, RETICCTPCT in the last 72 hours. Sepsis Labs: Recent Labs  Lab 09/15/19 0917  LATICACIDVEN 0.9    No results found for this or any previous visit (from the past 240 hour(s)).       Radiology Studies: No results found.      Scheduled Meds:  atorvastatin  80 mg Oral QPM   calcium acetate  1,334 mg Oral TID WC  Chlorhexidine Gluconate Cloth  6 each Topical Daily   Chlorhexidine Gluconate Cloth  6 each Topical Q0600   Chlorhexidine Gluconate Cloth  6 each Topical Q0600   [START ON 09/22/2019] darbepoetin (ARANESP) injection - NON-DIALYSIS  60 mcg Subcutaneous Q Mon-1800   doxycycline  100 mg Oral Q12H   feeding supplement  1 Container Oral TID BM   feeding supplement (PRO-STAT SUGAR FREE 64)  30 mL Oral TID WC   heparin injection (subcutaneous)  5,000 Units Subcutaneous Q8H   levofloxacin  500 mg Oral Q48H   levothyroxine  125 mcg Oral Q0600   multivitamin  1 tablet Oral QHS   nystatin   Topical TID   sodium chloride flush  10-40 mL Intracatheter Q12H   Continuous Infusions:  sodium chloride       LOS: 11 days    Time spent: 35 minutes.     Elmarie Shiley, MD [facility] Pager [phone]  If 7PM-7AM, please contact night-coverage [email or web address] Password [email or web address] 09/19/2019,  2:02 PM

## 2019-09-19 NOTE — Progress Notes (Signed)
Nutrition Follow-up  DOCUMENTATION CODES:   Morbid obesity  INTERVENTION:   -D/c Boost Breeze po TID, each supplement provides 250 kcal and 9 grams of protein -D/c Prostat TID, each supplement provides 100 kcals and 15 grams protein -Continue renal MVI daily -Double protein portions with meals  NUTRITION DIAGNOSIS:   Inadequate oral intake related to acute illness(pneumonia; abscess in left labia/upper thigh area) as evidenced by meal completion < 25%.  Ongoing  GOAL:   Patient will meet greater than or equal to 90% of their needs  Progressing   MONITOR:   PO intake, Labs, I & O's, Weight trends, Supplement acceptance, Skin  REASON FOR ASSESSMENT:   Consult Assessment of nutrition requirement/status  ASSESSMENT:   63 year old female with medical history significant for morbid obesity, CHF, hypothyroidism, HTN, OSA, CAD, T2DM, and arthritis who presented to ED with complaints of generalized weakness, severe dry cough, difficulty voiding, poor appetite, and difficulty sleeping for the past 3 days. Patient seen in ED 9/26 for abscess in left labia/upper thigh area that has been draining large amounts of pus; patient discharged on doxycyline and pain medication. Portable CXR showed multifocal pneumonia underlying pulmonary vascular congestion  10/1- transferred from [facility] to [facility] for surgery evaluation 10/2- surgery cancelled 2/2 plavix 10/6- s/p tunnelled catheter placement and HD initiation ; s/p IRRIGATION AND DEBRIDEMENTMONS ABSCESS (6 CM X 6 CM X 8 CM)  Reviewed I/O's: -1.7 L x 24 hours and -138 ml x 24 hours  UOP: 1.1 L x 24 hours  Per nephrology notes, pt will likley need HD tomorrow (3rd treatment).   Pt sleeping soundly at time of visit. Diet has been advanced to renal, carb modified with 1.2 L fluid restriction. She is refusing Prostat supplements.   Per general surgery notes, pt will likely require one more I&D.   Labs reviewed: CBGS: 136-142.   Diet  Order:   Diet Order            Diet renal/carb modified with fluid restriction Diet-HS Snack? Nothing; Fluid restriction: 1200 mL Fluid; Room service appropriate? Yes; Fluid consistency: Thin  Diet effective now              EDUCATION NEEDS:   Not appropriate for education at this time  Skin:  Skin Assessment: Skin Integrity Issues: Skin Integrity Issues:: Other (Comment) Other: lt labial abscess concerning for Fournier's gangrene  Last BM:  09/17/19  Height:   Ht Readings from Last 1 Encounters:  09/08/19 5\' 5"  (1.651 m)    Weight:   Wt Readings from Last 1 Encounters:  09/19/19 (!) 161.7 kg    Ideal Body Weight:  56.8 kg  BMI:  Body mass index is 59.32 kg/m.  Estimated Nutritional Needs:   Kcal:  1800-2000  Protein:  120-140 grams  Fluid:  > 1.8 L    Lasaro Primm A. Jimmye Norman, RD, LDN, [place] Registered Dietitian II Certified Diabetes Care and Education Specialist Pager: [phone] After hours Pager: [phone]

## 2019-09-19 NOTE — Progress Notes (Addendum)
Patient ID: Maureen Jackson, female   DOB: 1956-04-19, 63 y.o.   MRN: [id number] [facility] Progress Note   Assessment/ Plan:   1. Acute kidney Injury: Crt was 0.9 in Feb 2020- upon presentation to hospital on 9/28 was 1.88 and worsened from there.  Likely ATN from sepsis/vancomycin nephrotoxicity with differentials including AIN.   Had nontunneled catheter (leukocytosis) on 10/6 and initiation of HD later that night for clearance - then HD on 10/8  as well- short treatment at 200 BFR.  Really made no difference in numbers, feel will likely need HD tomorrow for 3rd treatment- Continue to monitor for recovery - nonoliguric    2. Encephalopathy -multifactorial with AKI as well as OSA, narcotics  3.  Anion gap metabolic acidosis: Secondary to acute kidney injury/SIRS from infection.  Stopped bicarb and on HD   4.  Left labial abscess versus Fournier's gangrene: Earlier CT scan suggestive of Fournier's gangrene prompting transfer to [facility] for possible surgical intervention.  On examination found to be having local labial abscess with drainage-ongoing intravenous antibiotics with persistent leukocytosis with wound care/antibiotic treatment; s/p I&D on 10/6 - WBC trending down, on levaquin    5.  Anemia: Secondary to acute/critical illness, no overt loss.  Continue to monitor trend. aranesp started on 10/5.  Feraheme x 1 on 10/7  6.  Hyperphosphatemia: Secondary to acute kidney injury and has been on calcium acetate.  Improving with HD as well     Subjective:   UOP over 1 liter which it has been- had short /low flow dialysis yest.  Not much difference in BUN/crt -  She denies complaint but not very talkative     Objective:   BP (!) 142/66 (BP Location: Left Arm)   Pulse 71   Temp 97.6 F (36.4 C) (Oral)   Resp 16   Ht 5\' 5"  (1.651 m)   Wt (!) 161.7 kg   SpO2 96%   BMI 59.32 kg/m   Intake/Output Summary (Last 24 hours) at 09/19/2019 0921 Last data filed at 09/19/2019  7564 Gross per 24 hour  Intake 970 ml  Output 2625 ml  Net -1655 ml   Weight change: -1 kg  Physical Exam:  Gen: Morbidly obese female. Awake on entry to room - one word responses  CVS: RRR no rub  Resp: decreased breath sounds; unlabored at rest  Abd: Soft, obese, nontender, bowel sounds normal Ext:  trace -1+ edema lower extremities   GU has a foley  Access RIJ nontunneled dialysis catheter placed 10/6  Imaging: No results found. Labs: BMET Recent Labs  Lab 09/13/19 0311 09/14/19 0815 09/15/19 0511 09/16/19 0444 09/17/19 0609 09/18/19 0354  NA 132* 132* 132* 133* 135 136  K 4.2 4.9 4.6 4.3 4.3 4.1  CL 98 97* 98 96* 98 99  CO2 17* 17* 17* 16* 19* 20*  GLUCOSE 118* 83 87 94 122* 117*  BUN 88* 96* 102* 104* 96* 98*  CREATININE 7.42* 7.53* 7.30* 6.72* 5.47* 4.92*  CALCIUM 6.5* 5.9* 5.9* 6.2* 6.9* 6.9*  PHOS 8.2* 9.3* 9.1* 8.3* 7.0* 6.1*   CBC Recent Labs  Lab 09/14/19 0815 09/15/19 0511 09/16/19 0444 09/17/19 0947 09/18/19 0354  WBC 22.7* 21.0* 22.9* 24.7*  25.5* 20.6*  NEUTROABS 18.1* 16.7*  --  20.2*  --   HGB 8.1* 8.4* 8.8* 8.4*  8.3* 8.2*  HCT 25.1* 25.8* 26.9* 25.7*  26.6* 26.3*  MCV 85.4 85.1 84.6 86.5  87.5 88.0  PLT 343 283  281 263  285 251    Medications:    . atorvastatin  80 mg Oral QPM  . calcium acetate  1,334 mg Oral TID WC  . Chlorhexidine Gluconate Cloth  6 each Topical Daily  . Chlorhexidine Gluconate Cloth  6 each Topical Q0600  . Chlorhexidine Gluconate Cloth  6 each Topical Q0600  . [START ON 09/22/2019] darbepoetin (ARANESP) injection - NON-DIALYSIS  60 mcg Subcutaneous Q Mon-1800  . doxycycline  100 mg Oral Q12H  . feeding supplement  1 Container Oral TID BM  . feeding supplement (PRO-STAT SUGAR FREE 64)  30 mL Oral TID WC  . heparin injection (subcutaneous)  5,000 Units Subcutaneous Q8H  . levofloxacin  500 mg Oral Q48H  . levothyroxine  125 mcg Oral Q0600  . multivitamin  1 tablet Oral QHS  . nystatin   Topical TID  .  sodium chloride flush  10-40 mL Intracatheter Q12H   Chalene Treu A Ruhee Enck 09/19/2019, 9:21 AM

## 2019-09-20 LAB — GLUCOSE, CAPILLARY
Glucose-Capillary: 165 mg/dL — ABNORMAL HIGH (ref 70–99)
Glucose-Capillary: 174 mg/dL — ABNORMAL HIGH (ref 70–99)
Glucose-Capillary: 191 mg/dL — ABNORMAL HIGH (ref 70–99)
Glucose-Capillary: 150 mg/dL — ABNORMAL HIGH (ref 70–99)

## 2019-09-20 LAB — RENAL FUNCTION PANEL
Albumin: 1.7 g/dL — ABNORMAL LOW (ref 3.5–5.0)
Anion gap: 12 (ref 5–15)
BUN: 71 mg/dL — ABNORMAL HIGH (ref 8–23)
CO2: 22 mmol/L (ref 22–32)
Calcium: 7.6 mg/dL — ABNORMAL LOW (ref 8.9–10.3)
Chloride: 102 mmol/L (ref 98–111)
Creatinine, Ser: 3.7 mg/dL — ABNORMAL HIGH (ref 0.44–1.00)
GFR calc Af Amer: 14 mL/min — ABNORMAL LOW (ref >60)
GFR calc non Af Amer: 12 mL/min — ABNORMAL LOW (ref >60)
Glucose, Bld: 202 mg/dL — ABNORMAL HIGH (ref 70–99)
Phosphorus: 3.7 mg/dL (ref 2.5–4.6)
Potassium: 3.8 mmol/L (ref 3.5–5.1)
Sodium: 136 mmol/L (ref 135–145)

## 2019-09-20 LAB — CBC
HCT: 25.6 % — ABNORMAL LOW (ref 36.0–46.0)
Hemoglobin: 7.9 g/dL — ABNORMAL LOW (ref 12.0–15.0)
MCH: 27.8 pg (ref 26.0–34.0)
MCHC: 30.9 g/dL (ref 30.0–36.0)
MCV: 90.1 fL (ref 80.0–100.0)
Platelets: 214 10*3/uL (ref 150–400)
RBC: 2.84 MIL/uL — ABNORMAL LOW (ref 3.87–5.11)
RDW: 14.6 % (ref 11.5–15.5)
WBC: 16.3 10*3/uL — ABNORMAL HIGH (ref 4.0–10.5)
nRBC: 0 % (ref 0.0–0.2)

## 2019-09-20 MED ORDER — SODIUM CHLORIDE 0.9 % IV SOLN
510.0000 mg | Freq: Once | INTRAVENOUS | Status: AC
  Administered 2019-09-20: 510 mg via INTRAVENOUS
  Filled 2019-09-20: qty 17

## 2019-09-20 MED ORDER — ONDANSETRON HCL 4 MG/2ML IJ SOLN
4.0000 mg | Freq: Three times a day (TID) | INTRAMUSCULAR | Status: DC | PRN
  Administered 2019-09-20 – 2019-09-28 (×3): 4 mg via INTRAVENOUS
  Filled 2019-09-20 (×3): qty 2

## 2019-09-20 NOTE — Progress Notes (Signed)
Patient ID: Maureen Jackson, female   DOB: 1956-02-24, 63 y.o.   MRN: [id number] [facility] Progress Note   Assessment/ Plan:   1. Acute kidney Injury: Crt was 0.9 in Feb 2020- upon presentation to hospital on 9/28 was 1.88 and worsened from there.  Likely ATN from sepsis/vancomycin nephrotoxicity with differentials including AIN.   Had nontunneled catheter (leukocytosis) on 10/6 and initiation of HD later that night  - then HD on 10/8  as well- short treatment at 200 BFR.   - nonoliguric and numbers not terrible today- will hold dialysis and follow  2. Encephalopathy -multifactorial with AKI as well as OSA, narcotics  3.  Anion gap metabolic acidosis: Secondary to acute kidney injury/SIRS from infection.  Stopped bicarb and on HD   4.  Left labial abscess versus Fournier's gangrene: Earlier CT scan suggestive of Fournier's gangrene prompting transfer to [facility] for possible surgical intervention.  On examination found to be having local labial abscess with drainage-ongoing intravenous antibiotics with persistent leukocytosis with wound care/antibiotic treatment; s/p I&D on 10/6 - WBC trending down, on levaquin    5.  Anemia: Secondary to acute/critical illness, no overt loss.  Continue to monitor trend. aranesp started on 10/5.  Feraheme x 1 on 10/7- will give another dose today   6.  Hyperphosphatemia: Secondary to acute kidney injury and has been on calcium acetate.  Improving - will hold the phoslo- corrected calcium is OK     Subjective:   UOP over 1 liter    BUN/crt improved from pre HD on Thursday -  She denies complaint    Objective:   BP 138/74 (BP Location: Right Wrist)   Pulse 74   Temp 98.1 F (36.7 C) (Oral)   Resp 20   Ht 5\' 5"  (1.651 m)   Wt (!) 158.6 kg   SpO2 98%   BMI 58.18 kg/m   Intake/Output Summary (Last 24 hours) at 09/20/2019 0838 Last data filed at 09/20/2019 0810 Gross per 24 hour  Intake 1120 ml  Output 1075 ml  Net 45 ml   Weight  change: -4.2 kg  Physical Exam:  Gen: Morbidly obese female. Awake on entry to room - one word responses for the most part  CVS: RRR no rub  Resp: decreased breath sounds; unlabored at rest  Abd: Soft, obese, nontender, bowel sounds normal Ext:  trace edema lower extremities - legs obese so exam difficult  GU has a foley  Access RIJ nontunneled dialysis catheter placed 10/6  Imaging: No results found. Labs: BMET Recent Labs  Lab 09/14/19 0815 09/15/19 0511 09/16/19 0444 09/17/19 0609 09/18/19 0354 09/20/19 0429  NA 132* 132* 133* 135 136 136  K 4.9 4.6 4.3 4.3 4.1 3.8  CL 97* 98 96* 98 99 102  CO2 17* 17* 16* 19* 20* 22  GLUCOSE 83 87 94 122* 117* 202*  BUN 96* 102* 104* 96* 98* 71*  CREATININE 7.53* 7.30* 6.72* 5.47* 4.92* 3.70*  CALCIUM 5.9* 5.9* 6.2* 6.9* 6.9* 7.6*  PHOS 9.3* 9.1* 8.3* 7.0* 6.1* 3.7   CBC Recent Labs  Lab 09/14/19 0815 09/15/19 0511  09/17/19 0947 09/18/19 0354 09/19/19 1125 09/20/19 0429  WBC 22.7* 21.0*   < > 24.7*  25.5* 20.6* 17.2* 16.3*  NEUTROABS 18.1* 16.7*  --  20.2*  --   --   --   HGB 8.1* 8.4*   < > 8.4*  8.3* 8.2* 8.1* 7.9*  HCT 25.1* 25.8*   < >  25.7*  26.6* 26.3* 26.9* 25.6*  MCV 85.4 85.1   < > 86.5  87.5 88.0 89.4 90.1  PLT 343 283   < > 263  285 251 226 214   < > = values in this interval not displayed.    Medications:    . atorvastatin  80 mg Oral QPM  . calcium acetate  1,334 mg Oral TID WC  . Chlorhexidine Gluconate Cloth  6 each Topical Daily  . [START ON 09/22/2019] darbepoetin (ARANESP) injection - NON-DIALYSIS  60 mcg Subcutaneous Q Mon-1800  . doxycycline  100 mg Oral Q12H  . heparin injection (subcutaneous)  5,000 Units Subcutaneous Q8H  . insulin aspart  0-9 Units Subcutaneous TID WC  . levofloxacin  500 mg Oral Q48H  . levothyroxine  125 mcg Oral Q0600  . multivitamin  1 tablet Oral QHS  . nystatin   Topical TID  . sodium chloride flush  10-40 mL Intracatheter Q12H   Evaluna Utke A  Janifer Gieselman 09/20/2019, 8:38 AM

## 2019-09-20 NOTE — Progress Notes (Signed)
PROGRESS NOTE    Maureen Jackson  KDX:[id number] DOB: 03/15/1956 DOA: 09/08/2019 PCP: Neale Burly, MD   Brief Narrative: 63 year old with past medical history significant for morbid obesity, systolic heart failure, hypothyroidism, hypertension, CAD and obstructive sleep apnea.  Presents with  generalized weakness, difficulty voiding, poor appetite and insomnia for 3 days.  September 26,  2 days prior to hospitalization she was seen in the emergency department for left labia upper thigh abscess which was drained.  Discharge home on oral antibiotics.  Patient had worsening renal function in the setting of concern for Fournier gangrene, patient was transferred from [facility]  to [facility] for further management.    Assessment & Plan:   Principal Problem:   Fournier's gangrene in female Active Problems:   Hypothyroidism   Essential hypertension   Morbid obesity/BMI > 55   Diabetes mellitus type 2 with complications, uncontrolled (HCC)   CAD, multiple vessel   Ischemic cardiomyopathy   Chronic combined systolic and diastolic CHF (congestive heart failure) (EF 30 to 35 %)   CAP (community acquired pneumonia)   Labial abscess   Abscess   Fistula   AMS (altered mental status)   Drug rash   Acute renal failure (ARF) (HCC)  1-Labial abscess/Fournier gangrene complicated with sepsis/acute metabolic encephalopathy -Patient presented with leukocytosis, fever post sediment in Foley.  Repeat CT scan done on 10/5 findings worrisome for Fournier gangrene: Patient was taken to the OR by general surgery on 10/6. -ID has been consulted and has been helping with antibiotics: -Patient developed rash today, antibiotics has been changed from Zyvox, Unasyn  to Levaquin, and Doxycycline.  -Surgery recommending wet-to-dry dressing twice a day -per surgery no need to go to OR>  -continue with levaquin and doxy for 10 days from 10-06/  2-Rash; Benadryl cream and oral benadryl PRN.  Unasyn and Zyvox  discontinue.  Plan to stop  CHG baths. Stable, not worse.   3-AKI;, volume overload non-anion gap metabolic acidosis.  Suspected acute tubular necrosis, oliguric hypocalcemia hyperphosphatemia: Nephrology consulted, patient was a started on hemodialysis on 10/6 HD on 10-08. Plan to monitor renal function.   4-Acute on chronic systolic heart failure exacerbation, ejection fraction 30 to 35%: Ischemic cardiomyopathy status post DES to LAD and RCA 2018.  Volume control with hemodialysis Holding antiplatelet due to surgical procedure resume when is okay by surgery  5-Diabetes: Continue with sliding scale insulin  Morbid obesity with OSA: Continue with CPAP Body mass index 60  Hypothyroidism continue with Synthroid Acute metabolic encephalopathy: Related to acute illness renal failure sepsis infection.   Less confuse, less lethargic today.    Nutrition Problem: Inadequate oral intake Etiology: acute illness(pneumonia; abscess in left labia/upper thigh area)    Signs/Symptoms: meal completion < 25%    Interventions: MVI, Refer to RD note for recommendations  Estimated body mass index is 58.18 kg/m as calculated from the following:   Height as of this encounter: 5\' 5"  (1.651 m).   Weight as of this encounter: 158.6 kg.   DVT prophylaxis: Lovenox Code Status: Full code Family Communication: Family at bedside Disposition Plan: remain in hospital for HD, renal failure, Consultants:   surgery  ID  Procedures:   Irrigation and debridement months abscess 6 x 6 by day 10/6  Antimicrobials:  Levaquin and doxy Received Zyvox, Unasyn  Subjective:  Rash improved. Denies itching.  Denies dyspnea. No significant supra-pubic pain. .    Objective: Vitals:   09/19/19 2112 09/20/19 9767 09/20/19 3419  09/20/19 1103  BP:  (!) 129/36 138/74 (!) 153/70  Pulse: 74 71 74 69  Resp: (!) 21 18 20 20   Temp: 98.1 F (36.7 C) 98.2 F (36.8 C) 98.1 F (36.7 C) 98.1 F (36.7 C)   TempSrc: Oral  Oral Oral  SpO2: 98% 97% 98% 97%  Weight:  (!) 158.6 kg    Height:        Intake/Output Summary (Last 24 hours) at 09/20/2019 1305 Last data filed at 09/20/2019 1157 Gross per 24 hour  Intake 680 ml  Output 1275 ml  Net -595 ml   Filed Weights   09/18/19 1458 09/19/19 0210 09/20/19 0455  Weight: (!) 159.7 kg (!) 161.7 kg (!) 158.6 kg    Examination:  General exam:Alert, morbid obese Respiratory system: CTA Cardiovascular system: S 1, S 2 RRR Gastrointestinal system: BS, present, soft, nt Central nervous system: Alert, non focal.  Extremities: trace edema Skin: Rash abdominal wall improved    Data Reviewed: I have personally reviewed following labs and imaging studies  CBC: Recent Labs  Lab 09/14/19 0815 09/15/19 0511 09/16/19 0444 09/17/19 0947 09/18/19 0354 09/19/19 1125 09/20/19 0429  WBC 22.7* 21.0* 22.9* 24.7*  25.5* 20.6* 17.2* 16.3*  NEUTROABS 18.1* 16.7*  --  20.2*  --   --   --   HGB 8.1* 8.4* 8.8* 8.4*  8.3* 8.2* 8.1* 7.9*  HCT 25.1* 25.8* 26.9* 25.7*  26.6* 26.3* 26.9* 25.6*  MCV 85.4 85.1 84.6 86.5  87.5 88.0 89.4 90.1  PLT 343 283 281 263  285 251 226 409   Basic Metabolic Panel: Recent Labs  Lab 09/15/19 0511 09/16/19 0444 09/17/19 0609 09/18/19 0354 09/20/19 0429  NA 132* 133* 135 136 136  K 4.6 4.3 4.3 4.1 3.8  CL 98 96* 98 99 102  CO2 17* 16* 19* 20* 22  GLUCOSE 87 94 122* 117* 202*  BUN 102* 104* 96* 98* 71*  CREATININE 7.30* 6.72* 5.47* 4.92* 3.70*  CALCIUM 5.9* 6.2* 6.9* 6.9* 7.6*  PHOS 9.1* 8.3* 7.0* 6.1* 3.7   GFR: Estimated Creatinine Clearance: 24.6 mL/min (A) (by C-G formula based on SCr of 3.7 mg/dL (H)). Liver Function Tests: Recent Labs  Lab 09/15/19 0511 09/16/19 0444 09/17/19 0609 09/18/19 0354 09/20/19 0429  ALBUMIN 1.5* 1.5* 1.4* 1.4* 1.7*   No results for input(s): LIPASE, AMYLASE in the last 168 hours. No results for input(s): AMMONIA in the last 168 hours. Coagulation Profile: No  results for input(s): INR, PROTIME in the last 168 hours. Cardiac Enzymes: No results for input(s): CKTOTAL, CKMB, CKMBINDEX, TROPONINI in the last 168 hours. BNP (last 3 results) No results for input(s): PROBNP in the last 8760 hours. HbA1C: No results for input(s): HGBA1C in the last 72 hours. CBG: Recent Labs  Lab 09/19/19 1106 09/19/19 1635 09/19/19 2116 09/20/19 0557 09/20/19 1105  GLUCAP 136* 169* 165* 165* 174*   Lipid Profile: No results for input(s): CHOL, HDL, LDLCALC, TRIG, CHOLHDL, LDLDIRECT in the last 72 hours. Thyroid Function Tests: No results for input(s): TSH, T4TOTAL, FREET4, T3FREE, THYROIDAB in the last 72 hours. Anemia Panel: No results for input(s): VITAMINB12, FOLATE, FERRITIN, TIBC, IRON, RETICCTPCT in the last 72 hours. Sepsis Labs: Recent Labs  Lab 09/15/19 0917  LATICACIDVEN 0.9    No results found for this or any previous visit (from the past 240 hour(s)).       Radiology Studies: No results found.      Scheduled Meds: . atorvastatin  80 mg Oral QPM  .  Chlorhexidine Gluconate Cloth  6 each Topical Daily  . [START ON 09/22/2019] darbepoetin (ARANESP) injection - NON-DIALYSIS  60 mcg Subcutaneous Q Mon-1800  . doxycycline  100 mg Oral Q12H  . heparin injection (subcutaneous)  5,000 Units Subcutaneous Q8H  . insulin aspart  0-9 Units Subcutaneous TID WC  . levofloxacin  500 mg Oral Q48H  . levothyroxine  125 mcg Oral Q0600  . multivitamin  1 tablet Oral QHS  . nystatin   Topical TID  . sodium chloride flush  10-40 mL Intracatheter Q12H   Continuous Infusions: . ferumoxytol    . sodium chloride       LOS: 12 days    Time spent: 35 minutes.     Elmarie Shiley, MD [facility] Pager [phone]  If 7PM-7AM, please contact night-coverage [email or web address] Password [email or web address] 09/20/2019, 1:05 PM

## 2019-09-21 DIAGNOSIS — L8990 Pressure ulcer of unspecified site, unspecified stage: Secondary | ICD-10-CM | POA: Insufficient documentation

## 2019-09-21 LAB — RENAL FUNCTION PANEL
Albumin: 1.7 g/dL — ABNORMAL LOW (ref 3.5–5.0)
Anion gap: 11 (ref 5–15)
BUN: 68 mg/dL — ABNORMAL HIGH (ref 8–23)
CO2: 24 mmol/L (ref 22–32)
Calcium: 7.6 mg/dL — ABNORMAL LOW (ref 8.9–10.3)
Chloride: 105 mmol/L (ref 98–111)
Creatinine, Ser: 3.38 mg/dL — ABNORMAL HIGH (ref 0.44–1.00)
GFR calc Af Amer: 16 mL/min — ABNORMAL LOW (ref >60)
GFR calc non Af Amer: 14 mL/min — ABNORMAL LOW (ref >60)
Glucose, Bld: 152 mg/dL — ABNORMAL HIGH (ref 70–99)
Phosphorus: 3.4 mg/dL (ref 2.5–4.6)
Potassium: 3.9 mmol/L (ref 3.5–5.1)
Sodium: 140 mmol/L (ref 135–145)

## 2019-09-21 LAB — GLUCOSE, CAPILLARY
Glucose-Capillary: 146 mg/dL — ABNORMAL HIGH (ref 70–99)
Glucose-Capillary: 149 mg/dL — ABNORMAL HIGH (ref 70–99)
Glucose-Capillary: 129 mg/dL — ABNORMAL HIGH (ref 70–99)
Glucose-Capillary: 120 mg/dL — ABNORMAL HIGH (ref 70–99)

## 2019-09-21 LAB — CBC
HCT: 26.7 % — ABNORMAL LOW (ref 36.0–46.0)
Hemoglobin: 8.1 g/dL — ABNORMAL LOW (ref 12.0–15.0)
MCH: 27.4 pg (ref 26.0–34.0)
MCHC: 30.3 g/dL (ref 30.0–36.0)
MCV: 90.2 fL (ref 80.0–100.0)
Platelets: 209 10*3/uL (ref 150–400)
RBC: 2.96 MIL/uL — ABNORMAL LOW (ref 3.87–5.11)
RDW: 14.6 % (ref 11.5–15.5)
WBC: 11.7 10*3/uL — ABNORMAL HIGH (ref 4.0–10.5)
nRBC: 0.2 % (ref 0.0–0.2)

## 2019-09-21 MED ORDER — SACCHAROMYCES BOULARDII 250 MG PO CAPS
250.0000 mg | ORAL_CAPSULE | Freq: Two times a day (BID) | ORAL | Status: DC
  Administered 2019-09-21 – 2019-10-02 (×23): 250 mg via ORAL
  Filled 2019-09-21 (×23): qty 1

## 2019-09-21 MED ORDER — NEPRO/CARBSTEADY PO LIQD: 237.0000 mL | Freq: Two times a day (BID) | ORAL | Status: DC

## 2019-09-21 NOTE — Progress Notes (Signed)
5 Days Post-Op    CC: Sepsis with labial abscess  Subjective: Both sites are OK,  No drainage from the labial site, the mons site is below with some white fibrous stuff at the base.    Objective: Vital signs in last 24 hours: Temp:  [98.1 F (36.7 C)-98.7 F (37.1 C)] 98.3 F (36.8 C) (10/11 0836) Pulse Rate:  [69-79] 77 (10/11 0836) Resp:  [18-20] 19 (10/11 0836) BP: (135-157)/(53-70) 150/53 (10/11 0836) SpO2:  [94 %-99 %] 98 % (10/11 0836) Weight:  [157.6 kg] 157.6 kg (10/11 0556) Last BM Date: 09/20/19 Afebrile vital signs are stable WBCs improving down to 11.7 Creatinine stable at 3.38   Intake/Output from previous day: 10/10 0701 - 10/11 0700 In: 420 [P.O.:320; IV Piggyback:100] Out: 1250 [Urine:1250] Intake/Output this shift: Total I/O In: 120 [P.O.:120] Out: -   General appearance: alert, cooperative and no distress Skin: see pictures below  White fibrous non viable tissue at the base, hard to get into the picture.     No drainage from here on 09/21/19 Lab Results:  Recent Labs    09/20/19 0429 09/21/19 0337  WBC 16.3* 11.7*  HGB 7.9* 8.1*  HCT 25.6* 26.7*  PLT 214 209    BMET Recent Labs    09/20/19 0429 09/21/19 0337  NA 136 140  K 3.8 3.9  CL 102 105  CO2 22 24  GLUCOSE 202* 152*  BUN 71* 68*  CREATININE 3.70* 3.38*  CALCIUM 7.6* 7.6*   PT/INR No results for input(s): LABPROT, INR in the last 72 hours.  Recent Labs  Lab 09/16/19 0444 09/17/19 0609 09/18/19 0354 09/20/19 0429 09/21/19 0337  ALBUMIN 1.5* 1.4* 1.4* 1.7* 1.7*     Lipase  No results found for: LIPASE   Medications:  atorvastatin  80 mg Oral QPM   Chlorhexidine Gluconate Cloth  6 each Topical Daily   [START ON 09/22/2019] darbepoetin (ARANESP) injection - NON-DIALYSIS  60 mcg Subcutaneous Q Mon-1800   doxycycline  100 mg Oral Q12H   feeding supplement (NEPRO CARB STEADY)  237 mL Oral BID BM   heparin injection (subcutaneous)  5,000 Units Subcutaneous  Q8H   insulin aspart  0-9 Units Subcutaneous TID WC   levofloxacin  500 mg Oral Q48H   levothyroxine  125 mcg Oral Q0600   multivitamin  1 tablet Oral QHS   nystatin   Topical TID   sodium chloride flush  10-40 mL Intracatheter Q12H    Assessment/Plan Morbid obesity BMI 60.13 DM CAD with cardiac stents in place on plavix Hx ischemic cardiomyopathy Acute on chronic systolic heart failure EF 30-35% Anemia Multifactorial encephalopathy Hypothyroidism HLD AKI on CKD  - Creatinine 1.88(9/28)>>4.13(9/30)>>7.42(10/3)>>6.72(10/6)>>4.92(10/8)   Sepsis with left labial abscess Irrigation and debridement of mons abscess 6 x 6 x 8 cm 09/16/2019 DR. Coralie Jackson  -New site found 09/18/2019 by nursing staff; see picture above. - CT scan from 9/30 shows extensive inflammatory/infectious process involving the mons pubis, labia, perineum and suprapubic regions with extensive gas formation worrisome for Fournier's gangrene; no discrete drainable soft tissue abscess - Wound is still open and draining. WBC is stable.Continue broad spectrum antibiotics per IDand wound care for now - OR tomorrow 10/06 -Leukocytosis:    WBC 21.0(9/28)>>20.9(10/2)>>22.9(10/6)>>20.6(10/8)>>11.7(10/11)  ID:  Vancomycin 9/28x 2 doses;Rocephin 9/28-10/2, azithromycin 9/28-10/2, clindamycin 9/30-10/2. Currently on Zyvox and Unasyn 10/2-10/7;  Doxycycline 10/7>> day 5 VTE -SCDs, sq heparin,last does of plavix 10/02 FEN -IVF,CM, NPO at midnight Foley -in place Follow up -TBD. POC: Maureen Jackson  Daughter [phone]  [phone]  Maureen Jackson,Maureen Jackson Friend   [phone]  Maureen Jackson Sister [phone]    Maureen Jackson   [phone]   Plan:  Continue current rx.  WBC improving and ID has signed off.  She does have some erythema both thighs.  I did not see any other areas of infection on the exam.        LOS: 13 days    Maureen Jackson 09/21/2019 [phone]

## 2019-09-21 NOTE — Procedures (Signed)
Attempted CPAP, patient refused.

## 2019-09-21 NOTE — Progress Notes (Signed)
Patient ID: Maureen Jackson, female   DOB: Apr 04, 1956, 63 y.o.   MRN: [id number] [facility] Progress Note   Assessment/ Plan:   1. Acute kidney Injury: Crt was 0.9 in Feb 2020- upon presentation to hospital on 9/28 was 1.88 and worsened from there.  Likely ATN from sepsis/vancomycin nephrotoxicity with differentials including AIN.   Had nontunneled catheter (leukocytosis) on 10/6 and initiation of HD later that night  - then HD on 10/8  as well- short treatment at 200 BFR.   - nonoliguric and numbers improving-  will hold dialysis and follow.  Keeping foley in as is max move and with labial abscess  2. Encephalopathy -multifactorial with AKI as well as OSA, narcotics  3.  Anion gap metabolic acidosis: Secondary to acute kidney injury/SIRS from infection.  Improved- on no therapy now  4.  Left labial abscess versus Fournier's gangrene: Earlier CT scan suggestive of Fournier's gangrene prompting transfer for possible surgical intervention.  On examination found to be having local labial abscess with drainage-ongoing intravenous antibiotics with wound care/antibiotic treatment; s/p I&D on 10/6 - WBC trending down, on levaquin.  Keeping foley in for now    5.  Anemia: Secondary to acute/critical illness, no overt loss.  Continue to monitor trend. aranesp started on 10/5.  Feraheme x 1 on 10/7 and 10/10 - hgb steady   6.  Hyperphosphatemia: Secondary to acute kidney injury and has been on calcium acetate.  No off binder    Subjective:   UOP 1200    BUN/crt improved  from yesterday  -  She does not seem happy-  Tired of laying in the bed     Objective:   BP (!) 150/53 (BP Location: Left Arm)   Pulse 77   Temp 98.3 F (36.8 C) (Oral)   Resp 19   Ht 5\' 5"  (1.651 m)   Wt (!) 157.6 kg   SpO2 98%   BMI 57.82 kg/m   Intake/Output Summary (Last 24 hours) at 09/21/2019 0915 Last data filed at 09/21/2019 6063 Gross per 24 hour  Intake 460 ml  Output 1250 ml  Net -790 ml    Weight change: -1 kg  Physical Exam:  Gen: Morbidly obese female. Not very happy "tired of laying in this bed "   CVS: RRR no rub  Resp: decreased breath sounds; unlabored at rest  Abd: Soft, obese, nontender, bowel sounds normal Ext:  trace edema lower extremities - legs obese so exam difficult  GU has a foley  Access RIJ nontunneled dialysis catheter placed 10/6  Imaging: No results found. Labs: BMET Recent Labs  Lab 09/15/19 0511 09/16/19 0444 09/17/19 0609 09/18/19 0354 09/20/19 0429 09/21/19 0337  NA 132* 133* 135 136 136 140  K 4.6 4.3 4.3 4.1 3.8 3.9  CL 98 96* 98 99 102 105  CO2 17* 16* 19* 20* 22 24  GLUCOSE 87 94 122* 117* 202* 152*  BUN 102* 104* 96* 98* 71* 68*  CREATININE 7.30* 6.72* 5.47* 4.92* 3.70* 3.38*  CALCIUM 5.9* 6.2* 6.9* 6.9* 7.6* 7.6*  PHOS 9.1* 8.3* 7.0* 6.1* 3.7 3.4   CBC Recent Labs  Lab 09/15/19 0511  09/17/19 0947 09/18/19 0354 09/19/19 1125 09/20/19 0429 09/21/19 0337  WBC 21.0*   < > 24.7*  25.5* 20.6* 17.2* 16.3* 11.7*  NEUTROABS 16.7*  --  20.2*  --   --   --   --   HGB 8.4*   < > 8.4*  8.3* 8.2* 8.1*  7.9* 8.1*  HCT 25.8*   < > 25.7*  26.6* 26.3* 26.9* 25.6* 26.7*  MCV 85.1   < > 86.5  87.5 88.0 89.4 90.1 90.2  PLT 283   < > 263  285 251 226 214 209   < > = values in this interval not displayed.    Medications:    . atorvastatin  80 mg Oral QPM  . Chlorhexidine Gluconate Cloth  6 each Topical Daily  . [START ON 09/22/2019] darbepoetin (ARANESP) injection - NON-DIALYSIS  60 mcg Subcutaneous Q Mon-1800  . doxycycline  100 mg Oral Q12H  . feeding supplement (NEPRO CARB STEADY)  237 mL Oral BID BM  . heparin injection (subcutaneous)  5,000 Units Subcutaneous Q8H  . insulin aspart  0-9 Units Subcutaneous TID WC  . levofloxacin  500 mg Oral Q48H  . levothyroxine  125 mcg Oral Q0600  . multivitamin  1 tablet Oral QHS  . nystatin   Topical TID  . sodium chloride flush  10-40 mL Intracatheter Q12H   Maureen Jackson A  Brady Plant 09/21/2019, 9:15 AM

## 2019-09-21 NOTE — Progress Notes (Addendum)
PROGRESS NOTE    Maureen Jackson  GEX:[id number] DOB: 1956/05/25 DOA: 09/08/2019 PCP: Neale Burly, MD   Brief Narrative: 63 year old with past medical history significant for morbid obesity, systolic heart failure, hypothyroidism, hypertension, CAD and obstructive sleep apnea.  Presents with  generalized weakness, difficulty voiding, poor appetite and insomnia for 3 days.  September 26,  2 days prior to hospitalization she was seen in the emergency department for left labia upper thigh abscess which was drained.  Discharge home on oral antibiotics.  Patient had worsening renal function in the setting of concern for Fournier gangrene, patient was transferred from [facility]  to [facility] for further management.    Assessment & Plan:   Principal Problem:   Fournier's gangrene in female Active Problems:   Hypothyroidism   Essential hypertension   Morbid obesity/BMI > 55   Diabetes mellitus type 2 with complications, uncontrolled (HCC)   CAD, multiple vessel   Ischemic cardiomyopathy   Chronic combined systolic and diastolic CHF (congestive heart failure) (EF 30 to 35 %)   CAP (community acquired pneumonia)   Labial abscess   Abscess   Fistula   AMS (altered mental status)   Drug rash   Acute renal failure (ARF) (HCC)   Pressure injury of skin  1-Labial abscess/Fournier gangrene complicated with sepsis/acute metabolic encephalopathy -Patient presented with leukocytosis, fever post sediment in Foley.  Repeat CT scan done on 10/5 findings worrisome for Fournier gangrene: Patient was taken to the OR by general surgery on 10/6. -ID has been consulted and has been helping with antibiotics: -Patient developed rash today, antibiotics has been changed from Zyvox, Unasyn  to Levaquin, and Doxycycline.  -Surgery recommending wet-to-dry dressing twice a day -Per surgery no need to go to OR>  -Continue with levaquin and doxy for 10 days from 10-06/ -Continue with wound care.    2-Rash; Benadryl cream and oral benadryl PRN.  Unasyn and Zyvox discontinue.  Plan to stop  CHG baths. Stable, not worse.   3-AKI;, volume overload non-anion gap metabolic acidosis.  Suspected acute tubular necrosis, oliguric hypocalcemia hyperphosphatemia: Nephrology consulted, patient was a started on hemodialysis on 10/6 HD on 10-08. Plan to monitor renal function.   4-Acute on chronic systolic heart failure exacerbation, ejection fraction 30 to 35%: Ischemic cardiomyopathy status post DES to LAD and RCA 2018.  Volume control with hemodialysis Holding antiplatelet due to surgical procedure resume when is okay by surgery  5-Diabetes: Continue with sliding scale insulin  6-Morbid obesity with OSA: Continue with CPAP Body mass index 60  Hypothyroidism Continue with Synthroid Acute metabolic encephalopathy: Related to acute illness renal failure sepsis infection.   Less confuse, less lethargic today.    Nutrition Problem: Inadequate oral intake Etiology: acute illness(pneumonia; abscess in left labia/upper thigh area)    Signs/Symptoms: meal completion < 25%    Interventions: MVI, Refer to RD note for recommendations  Estimated body mass index is 57.82 kg/m as calculated from the following:   Height as of this encounter: 5\' 5"  (1.651 m).   Weight as of this encounter: 157.6 kg.   DVT prophylaxis: Lovenox Code Status: Full code Family Communication: Daughter.  Disposition Plan: remain in hospital for HD, renal failure, Consultants:   surgery  ID  Procedures:   Irrigation and debridement months abscess 6 x 6 by day 10/6  Antimicrobials:  Levaquin and doxy Received Zyvox, Unasyn  Subjective: No new complaints. Denies abdominal pain, moving her bowel.   Objective: Vitals:   09/21/19  0155 09/21/19 0556 09/21/19 0836 09/21/19 1150  BP: (!) 145/64 (!) 149/55 (!) 150/53 (!) 152/72  Pulse: 79 78 77 79  Resp: 18 20 19 19   Temp: 98.2 F (36.8 C) 98.3 F  (36.8 C) 98.3 F (36.8 C) 98.4 F (36.9 C)  TempSrc: Oral Oral Oral Oral  SpO2: 94% 98% 98% 98%  Weight:  (!) 157.6 kg    Height:        Intake/Output Summary (Last 24 hours) at 09/21/2019 1243 Last data filed at 09/21/2019 2536 Gross per 24 hour  Intake 340 ml  Output 750 ml  Net -410 ml   Filed Weights   09/19/19 0210 09/20/19 0455 09/21/19 0556  Weight: (!) 161.7 kg (!) 158.6 kg (!) 157.6 kg    Examination:  General exam: Alert, morbid obese Respiratory system: CTA Cardiovascular system: S 1 S 2 RRR Gastrointestinal system: BS present, soft,nt Central nervous system: Alert, non focal.  Extremities: Trace edema Skin: Rash abdominal wall improved    Data Reviewed: I have personally reviewed following labs and imaging studies  CBC: Recent Labs  Lab 09/15/19 0511  09/17/19 0947 09/18/19 0354 09/19/19 1125 09/20/19 0429 09/21/19 0337  WBC 21.0*   < > 24.7*   25.5* 20.6* 17.2* 16.3* 11.7*  NEUTROABS 16.7*  --  20.2*  --   --   --   --   HGB 8.4*   < > 8.4*   8.3* 8.2* 8.1* 7.9* 8.1*  HCT 25.8*   < > 25.7*   26.6* 26.3* 26.9* 25.6* 26.7*  MCV 85.1   < > 86.5   87.5 88.0 89.4 90.1 90.2  PLT 283   < > 263   285 251 226 214 209   < > = values in this interval not displayed.   Basic Metabolic Panel: Recent Labs  Lab 09/16/19 0444 09/17/19 0609 09/18/19 0354 09/20/19 0429 09/21/19 0337  NA 133* 135 136 136 140  K 4.3 4.3 4.1 3.8 3.9  CL 96* 98 99 102 105  CO2 16* 19* 20* 22 24  GLUCOSE 94 122* 117* 202* 152*  BUN 104* 96* 98* 71* 68*  CREATININE 6.72* 5.47* 4.92* 3.70* 3.38*  CALCIUM 6.2* 6.9* 6.9* 7.6* 7.6*  PHOS 8.3* 7.0* 6.1* 3.7 3.4   GFR: Estimated Creatinine Clearance: 26.8 mL/min (A) (by C-G formula based on SCr of 3.38 mg/dL (H)). Liver Function Tests: Recent Labs  Lab 09/16/19 0444 09/17/19 0609 09/18/19 0354 09/20/19 0429 09/21/19 0337  ALBUMIN 1.5* 1.4* 1.4* 1.7* 1.7*   No results for input(s): LIPASE, AMYLASE in the last 168  hours. No results for input(s): AMMONIA in the last 168 hours. Coagulation Profile: No results for input(s): INR, PROTIME in the last 168 hours. Cardiac Enzymes: No results for input(s): CKTOTAL, CKMB, CKMBINDEX, TROPONINI in the last 168 hours. BNP (last 3 results) No results for input(s): PROBNP in the last 8760 hours. HbA1C: No results for input(s): HGBA1C in the last 72 hours. CBG: Recent Labs  Lab 09/20/19 0557 09/20/19 1105 09/20/19 1608 09/20/19 2058 09/21/19 0617  GLUCAP 165* 174* 191* 150* 146*   Lipid Profile: No results for input(s): CHOL, HDL, LDLCALC, TRIG, CHOLHDL, LDLDIRECT in the last 72 hours. Thyroid Function Tests: No results for input(s): TSH, T4TOTAL, FREET4, T3FREE, THYROIDAB in the last 72 hours. Anemia Panel: No results for input(s): VITAMINB12, FOLATE, FERRITIN, TIBC, IRON, RETICCTPCT in the last 72 hours. Sepsis Labs: Recent Labs  Lab 09/15/19 0917  LATICACIDVEN 0.9    No  results found for this or any previous visit (from the past 240 hour(s)).       Radiology Studies: No results found.      Scheduled Meds:  atorvastatin  80 mg Oral QPM   Chlorhexidine Gluconate Cloth  6 each Topical Daily   [START ON 09/22/2019] darbepoetin (ARANESP) injection - NON-DIALYSIS  60 mcg Subcutaneous Q Mon-1800   doxycycline  100 mg Oral Q12H   feeding supplement (NEPRO CARB STEADY)  237 mL Oral BID BM   heparin injection (subcutaneous)  5,000 Units Subcutaneous Q8H   insulin aspart  0-9 Units Subcutaneous TID WC   levofloxacin  500 mg Oral Q48H   levothyroxine  125 mcg Oral Q0600   multivitamin  1 tablet Oral QHS   nystatin   Topical TID   sodium chloride flush  10-40 mL Intracatheter Q12H   Continuous Infusions:  sodium chloride       LOS: 13 days    Time spent: 35 minutes.     Elmarie Shiley, MD [facility] Pager [phone]  If 7PM-7AM, please contact night-coverage [email or web address] Password [facility] 09/21/2019,  12:43 PM

## 2019-09-21 NOTE — TOC Progression Note (Signed)
Transition of Care [facility]) - Progression Note    Patient Details  Name: Maureen Jackson MRN: [id number] Date of Birth: September 15, 1956  Transition of Care [facility]) CM/SW [place], [place] Phone Number: [phone] 09/21/2019, 3:37 PM  Clinical Narrative:     CSW met with patient at bedside to discuss progress of SNF placement. CSW notified patient that it has not been anymore bed offers and would she consider SNfs in [place]. CSW explained that due to her insurance some SNFs do not accept. Patient informed CSW that she would like to go to [facility] [employer] where her family member works there after verifying the name. CSW explained that she would fax out referral to [facility].  CSW acknowledges consult for SNF however SNF workup has been completed and consult is being closed out.  TOC team will continue to follow for discharge planning.  Expected Discharge Plan: Skilled Nursing Facility Barriers to Discharge: No Barriers Identified  Expected Discharge Plan and Services Expected Discharge Plan: [place] In-house Referral: NA Discharge Planning Services: CM Consult Post Acute Care Choice: [place] Living arrangements for the past 2 months: Apartment                 DME Arranged: (NA)         HH Arranged: NA           Social Determinants of Health (SDOH) Interventions    Readmission Risk Interventions Readmission Risk Prevention Plan 09/18/2019  Transportation Screening Complete  HRI or [place] Complete  Social Work Consult for [place] Planning/Counseling Complete  Palliative Care Screening Not Applicable  Medication Review Press photographer) Complete  Some recent data might be hidden

## 2019-09-22 LAB — RENAL FUNCTION PANEL
Albumin: 1.8 g/dL — ABNORMAL LOW (ref 3.5–5.0)
Anion gap: 8 (ref 5–15)
BUN: 61 mg/dL — ABNORMAL HIGH (ref 8–23)
CO2: 23 mmol/L (ref 22–32)
Calcium: 7.3 mg/dL — ABNORMAL LOW (ref 8.9–10.3)
Chloride: 109 mmol/L (ref 98–111)
Creatinine, Ser: 3.02 mg/dL — ABNORMAL HIGH (ref 0.44–1.00)
GFR calc Af Amer: 19 mL/min — ABNORMAL LOW (ref >60)
GFR calc non Af Amer: 16 mL/min — ABNORMAL LOW (ref >60)
Glucose, Bld: 143 mg/dL — ABNORMAL HIGH (ref 70–99)
Phosphorus: 2.9 mg/dL (ref 2.5–4.6)
Potassium: 3.8 mmol/L (ref 3.5–5.1)
Sodium: 140 mmol/L (ref 135–145)

## 2019-09-22 LAB — GLUCOSE, CAPILLARY
Glucose-Capillary: 121 mg/dL — ABNORMAL HIGH (ref 70–99)
Glucose-Capillary: 112 mg/dL — ABNORMAL HIGH (ref 70–99)
Glucose-Capillary: 128 mg/dL — ABNORMAL HIGH (ref 70–99)
Glucose-Capillary: 130 mg/dL — ABNORMAL HIGH (ref 70–99)

## 2019-09-22 MED ORDER — CLOPIDOGREL BISULFATE 75 MG PO TABS
75.0000 mg | ORAL_TABLET | Freq: Every morning | ORAL | Status: DC
  Administered 2019-09-22 – 2019-10-02 (×11): 75 mg via ORAL
  Filled 2019-09-22 (×11): qty 1

## 2019-09-22 MED ORDER — ASPIRIN EC 81 MG PO TBEC
81.0000 mg | DELAYED_RELEASE_TABLET | Freq: Every day | ORAL | Status: DC
  Administered 2019-09-22 – 2019-10-02 (×11): 81 mg via ORAL
  Filled 2019-09-22 (×11): qty 1

## 2019-09-22 NOTE — Progress Notes (Addendum)
Patient has had multiple episode of soft BMs-  Patient on Antibiotics for wound Also asked if she would call daughter with update   MD aware-   Will order a probiotic

## 2019-09-22 NOTE — Progress Notes (Signed)
Patient sister updated about plan of care and activities throughout the day. Pt  2 bowel movements peri and foley care completed. Wound care completed with Nurse Tech help. Bed placed in chair position and patient dangled on side of bed to eat supper. Back washed rubbed and rubbed with benadryl per patient request.

## 2019-09-22 NOTE — Progress Notes (Signed)
PROGRESS NOTE    Maureen Jackson  VQQ:[id number] DOB: January 23, 1956 DOA: 09/08/2019 PCP: Neale Burly, MD   Brief Narrative: 63 year old with past medical history significant for morbid obesity, systolic heart failure, hypothyroidism, hypertension, CAD and obstructive sleep apnea.  Presents with  generalized weakness, difficulty voiding, poor appetite and insomnia for 3 days.  September 26,  2 days prior to hospitalization she was seen in the emergency department for left labia upper thigh abscess which was drained.  Discharge home on oral antibiotics.  Patient had worsening renal function in the setting of concern for Fournier gangrene, patient was transferred from [facility]  to [facility] for further management.    Assessment & Plan:   Principal Problem:   Fournier's gangrene in female Active Problems:   Hypothyroidism   Essential hypertension   Morbid obesity/BMI > 55   Diabetes mellitus type 2 with complications, uncontrolled (HCC)   CAD, multiple vessel   Ischemic cardiomyopathy   Chronic combined systolic and diastolic CHF (congestive heart failure) (EF 30 to 35 %)   CAP (community acquired pneumonia)   Labial abscess   Abscess   Fistula   AMS (altered mental status)   Drug rash   Acute renal failure (ARF) (HCC)   Pressure injury of skin  1-Labial abscess/Fournier gangrene complicated with sepsis/acute metabolic encephalopathy -Patient presented with leukocytosis, fever post sediment in Foley.  Repeat CT scan done on 10/5 findings worrisome for Fournier gangrene: Patient was taken to the OR by general surgery on 10/6. -ID has been consulted and has been helping with antibiotics: -Patient developed rash today, antibiotics has been changed from Zyvox, Unasyn  to Levaquin, and Doxycycline.  -Surgery recommending wet-to-dry dressing twice a day -Per surgery no need to go to OR>  -Continue with levaquin and doxy for 10 days from 10-06/--last day 10-16 -Continue with wound care.    2-Rash; Benadryl cream and oral benadryl PRN.  Unasyn and Zyvox discontinue.  Plan to stop  CHG baths. Stable, not worse.   3-AKI;, volume overload non-anion gap metabolic acidosis.  Suspected acute tubular necrosis, oliguric hypocalcemia hyperphosphatemia: Nephrology consulted, patient was a started on hemodialysis on 10/6 Had HD on 10-08. Plan to monitor renal function.  Cr trending down, urine out put improved.  Urine out put 1.5 L yesterday,   4-Acute on chronic systolic heart failure exacerbation, ejection fraction 30 to 35%: Ischemic cardiomyopathy status post DES to LAD and RCA 2018.  Volume control with hemodialysis Resume aspirin/ plavix  5-Diabetes: Continue with sliding scale insulin  6-Morbid obesity with OSA: Continue with CPAP Body mass index 60  Hypothyroidism Continue with Synthroid Acute metabolic encephalopathy: Related to acute illness renal failure sepsis infection.   Less confuse, less lethargic.    Nutrition Problem: Inadequate oral intake Etiology: acute illness(pneumonia; abscess in left labia/upper thigh area)    Signs/Symptoms: meal completion < 25%    Interventions: MVI, Refer to RD note for recommendations  Estimated body mass index is 59.24 kg/m as calculated from the following:   Height as of this encounter: 5\' 5"  (1.651 m).   Weight as of this encounter: 161.5 kg.   DVT prophylaxis: Lovenox Code Status: Full code Family Communication: Daughter.  Disposition Plan: remain in hospital for HD, renal failure, Consultants:   surgery  ID  Procedures:   Irrigation and debridement months abscess 6 x 6 by day 10/6  Antimicrobials:  Levaquin and doxy Received Zyvox, Unasyn  Subjective: Sleepy, wake up answer few questions. Denies pain.  Objective: Vitals:   09/22/19 0411 09/22/19 0558 09/22/19 0823 09/22/19 1235  BP:  140/74 (!) 153/63 (!) 157/58  Pulse:  82 73 74  Resp:  18 18 18   Temp:  98.8 F (37.1 C) 98.7 F (37.1  C) 97.7 F (36.5 C)  TempSrc:  Oral Oral Oral  SpO2:  98% 97% 95%  Weight: (!) 161.5 kg     Height:        Intake/Output Summary (Last 24 hours) at 09/22/2019 1409 Last data filed at 09/22/2019 0900 Gross per 24 hour  Intake 660 ml  Output 1175 ml  Net -515 ml   Filed Weights   09/21/19 0556 09/22/19 0400 09/22/19 0411  Weight: (!) 157.6 kg (!) 161.5 kg (!) 161.5 kg    Examination:  General exam: Alert, morbid obese.  Respiratory system: CTA Cardiovascular system: S 1 , S 2 RRR Gastrointestinal system: BS present, soft, nt Central nervous system: Alert, non focal.  Extremities: Trace edema Skin: Rash abdominal wall improved    Data Reviewed: I have personally reviewed following labs and imaging studies  CBC: Recent Labs  Lab 09/17/19 0947 09/18/19 0354 09/19/19 1125 09/20/19 0429 09/21/19 0337  WBC 24.7*  25.5* 20.6* 17.2* 16.3* 11.7*  NEUTROABS 20.2*  --   --   --   --   HGB 8.4*  8.3* 8.2* 8.1* 7.9* 8.1*  HCT 25.7*  26.6* 26.3* 26.9* 25.6* 26.7*  MCV 86.5  87.5 88.0 89.4 90.1 90.2  PLT 263  285 251 226 214 149   Basic Metabolic Panel: Recent Labs  Lab 09/17/19 0609 09/18/19 0354 09/20/19 0429 09/21/19 0337 09/22/19 0424  NA 135 136 136 140 140  K 4.3 4.1 3.8 3.9 3.8  CL 98 99 102 105 109  CO2 19* 20* 22 24 23   GLUCOSE 122* 117* 202* 152* 143*  BUN 96* 98* 71* 68* 61*  CREATININE 5.47* 4.92* 3.70* 3.38* 3.02*  CALCIUM 6.9* 6.9* 7.6* 7.6* 7.3*  PHOS 7.0* 6.1* 3.7 3.4 2.9   GFR: Estimated Creatinine Clearance: 30.5 mL/min (A) (by C-G formula based on SCr of 3.02 mg/dL (H)). Liver Function Tests: Recent Labs  Lab 09/17/19 0609 09/18/19 0354 09/20/19 0429 09/21/19 0337 09/22/19 0424  ALBUMIN 1.4* 1.4* 1.7* 1.7* 1.8*   No results for input(s): LIPASE, AMYLASE in the last 168 hours. No results for input(s): AMMONIA in the last 168 hours. Coagulation Profile: No results for input(s): INR, PROTIME in the last 168 hours. Cardiac  Enzymes: No results for input(s): CKTOTAL, CKMB, CKMBINDEX, TROPONINI in the last 168 hours. BNP (last 3 results) No results for input(s): PROBNP in the last 8760 hours. HbA1C: No results for input(s): HGBA1C in the last 72 hours. CBG: Recent Labs  Lab 09/21/19 0617 09/21/19 1147 09/21/19 1643 09/21/19 2158 09/22/19 0557  GLUCAP 146* 149* 129* 120* 121*   Lipid Profile: No results for input(s): CHOL, HDL, LDLCALC, TRIG, CHOLHDL, LDLDIRECT in the last 72 hours. Thyroid Function Tests: No results for input(s): TSH, T4TOTAL, FREET4, T3FREE, THYROIDAB in the last 72 hours. Anemia Panel: No results for input(s): VITAMINB12, FOLATE, FERRITIN, TIBC, IRON, RETICCTPCT in the last 72 hours. Sepsis Labs: No results for input(s): PROCALCITON, LATICACIDVEN in the last 168 hours.  No results found for this or any previous visit (from the past 240 hour(s)).       Radiology Studies: No results found.      Scheduled Meds: . atorvastatin  80 mg Oral QPM  . Chlorhexidine Gluconate Cloth  6  each Topical Daily  . darbepoetin (ARANESP) injection - NON-DIALYSIS  60 mcg Subcutaneous Q Mon-1800  . doxycycline  100 mg Oral Q12H  . feeding supplement (NEPRO CARB STEADY)  237 mL Oral BID BM  . heparin injection (subcutaneous)  5,000 Units Subcutaneous Q8H  . insulin aspart  0-9 Units Subcutaneous TID WC  . levofloxacin  500 mg Oral Q48H  . levothyroxine  125 mcg Oral Q0600  . multivitamin  1 tablet Oral QHS  . nystatin   Topical TID  . saccharomyces boulardii  250 mg Oral BID  . sodium chloride flush  10-40 mL Intracatheter Q12H   Continuous Infusions: . sodium chloride       LOS: 14 days    Time spent: 35 minutes.     Elmarie Shiley, MD [facility] Pager [phone]  If 7PM-7AM, please contact night-coverage [email or web address] Password [email or web address] 09/22/2019, 2:09 PM

## 2019-09-22 NOTE — Progress Notes (Signed)
Spoke to sister over the phone- per pt ok to give update

## 2019-09-22 NOTE — Progress Notes (Signed)
Spoke to patient sister again regarding plan of care.  Also gave update to patient's friend Ms. Franco Nones

## 2019-09-22 NOTE — Progress Notes (Signed)
Patient ID: Maureen Jackson, female   DOB: 03/14/1956, 63 y.o.   MRN: [id number]    6 Days Post-Op  Subjective: No new complaints.  Wants to get OOB to a chair  Objective: Vital signs in last 24 hours: Temp:  [98 F (36.7 C)-98.8 F (37.1 C)] 98.7 F (37.1 C) (10/12 0823) Pulse Rate:  [71-82] 73 (10/12 0823) Resp:  [18-20] 18 (10/12 0823) BP: (120-153)/(47-74) 153/63 (10/12 0823) SpO2:  [97 %-98 %] 97 % (10/12 0823) Weight:  [161.5 kg] 161.5 kg (10/12 0411) Last BM Date: 09/22/19  Intake/Output from previous day: 10/11 0701 - 10/12 0700 In: 1570 [P.O.:1560; I.V.:10] Out: 1550 [Urine:1550] Intake/Output this shift: Total I/O In: 220 [P.O.:220] Out: -   PE: Skin: mons wound is generally clean, some fibrin at the central base, but cleaning up.  No further areas that need debridement  Lab Results:  Recent Labs    09/20/19 0429 09/21/19 0337  WBC 16.3* 11.7*  HGB 7.9* 8.1*  HCT 25.6* 26.7*  PLT 214 209   BMET Recent Labs    09/21/19 0337 09/22/19 0424  NA 140 140  K 3.9 3.8  CL 105 109  CO2 24 23  GLUCOSE 152* 143*  BUN 68* 61*  CREATININE 3.38* 3.02*  CALCIUM 7.6* 7.3*   PT/INR No results for input(s): LABPROT, INR in the last 72 hours. CMP     Component Value Date/Time   NA 140 09/22/2019 0424   K 3.8 09/22/2019 0424   CL 109 09/22/2019 0424   CO2 23 09/22/2019 0424   GLUCOSE 143 (H) 09/22/2019 0424   BUN 61 (H) 09/22/2019 0424   CREATININE 3.02 (H) 09/22/2019 0424   CALCIUM 7.3 (L) 09/22/2019 0424   PROT 6.2 (L) 09/11/2019 0813   ALBUMIN 1.8 (L) 09/22/2019 0424   AST 73 (H) 09/11/2019 0813   ALT 42 09/11/2019 0813   ALKPHOS 166 (H) 09/11/2019 0813   BILITOT 1.0 09/11/2019 0813   GFRNONAA 16 (L) 09/22/2019 0424   GFRAA 19 (L) 09/22/2019 0424   Lipase  No results found for: LIPASE     Studies/Results: No results found.  Anti-infectives: Anti-infectives (From admission, onward)   Start     Dose/Rate Route Frequency Ordered Stop   09/19/19 1600  levofloxacin (LEVAQUIN) tablet 500 mg     500 mg Oral Every 48 hours 09/18/19 1118     09/17/19 1600  levofloxacin (LEVAQUIN) tablet 750 mg  Status:  Discontinued     750 mg Oral Every 48 hours 09/17/19 1100 09/18/19 1118   09/17/19 1600  doxycycline (VIBRA-TABS) tablet 100 mg     100 mg Oral Every 12 hours 09/17/19 1100     09/16/19 1600  linezolid (ZYVOX) IVPB 600 mg  Status:  Discontinued     600 mg 300 mL/hr over 60 Minutes Intravenous Every 12 hours 09/16/19 1556 09/17/19 1100   09/12/19 1100  linezolid (ZYVOX) IVPB 600 mg  Status:  Discontinued     600 mg 300 mL/hr over 60 Minutes Intravenous Every 12 hours 09/12/19 0945 09/16/19 1556   09/12/19 1100  Ampicillin-Sulbactam (UNASYN) 3 g in sodium chloride 0.9 % 100 mL IVPB  Status:  Discontinued     3 g 200 mL/hr over 30 Minutes Intravenous Every 12 hours 09/12/19 0945 09/17/19 1100   09/10/19 1430  clindamycin (CLEOCIN) IVPB 600 mg  Status:  Discontinued     600 mg 100 mL/hr over 30 Minutes Intravenous Every 6 hours 09/10/19 1410 09/12/19 0945  09/10/19 0742  vancomycin variable dose per unstable renal function (pharmacist dosing)  Status:  Discontinued      Does not apply See admin instructions 09/10/19 0742 09/10/19 1413   09/10/19 0000  vancomycin (VANCOCIN) 1,750 mg in sodium chloride 0.9 % 500 mL IVPB  Status:  Discontinued     1,750 mg 250 mL/hr over 120 Minutes Intravenous Every 24 hours 09/09/19 0912 09/09/19 0928   09/10/19 0000  vancomycin (VANCOCIN) 1,750 mg in sodium chloride 0.9 % 500 mL IVPB  Status:  Discontinued     1,750 mg 250 mL/hr over 120 Minutes Intravenous Every 24 hours 09/09/19 0940 09/10/19 0751   09/09/19 1030  vancomycin (VANCOCIN) IVPB 750 mg/150 ml premix  Status:  Discontinued     750 mg 150 mL/hr over 60 Minutes Intravenous  Once 09/09/19 0928 09/09/19 0938   09/09/19 0930  vancomycin (VANCOCIN) IVPB 1000 mg/200 mL premix  Status:  Discontinued     1,000 mg 200 mL/hr over 60 Minutes  Intravenous  Once 09/09/19 0928 09/09/19 0938   09/08/19 2300  azithromycin (ZITHROMAX) 500 mg in sodium chloride 0.9 % 250 mL IVPB  Status:  Discontinued     500 mg 250 mL/hr over 60 Minutes Intravenous Every 24 hours 09/08/19 2230 09/12/19 0945   09/08/19 2300  cefTRIAXone (ROCEPHIN) 2 g in sodium chloride 0.9 % 100 mL IVPB  Status:  Discontinued     2 g 200 mL/hr over 30 Minutes Intravenous Every 24 hours 09/08/19 2230 09/12/19 0945   09/08/19 2300  vancomycin (VANCOCIN) 1,500 mg in sodium chloride 0.9 % 500 mL IVPB     1,500 mg 250 mL/hr over 120 Minutes Intravenous  Once 09/08/19 2254 09/09/19 0434   09/08/19 1600  vancomycin (VANCOCIN) IVPB 1000 mg/200 mL premix     1,000 mg 200 mL/hr over 60 Minutes Intravenous  Once 09/08/19 1558 09/08/19 1929   09/08/19 1600  piperacillin-tazobactam (ZOSYN) IVPB 3.375 g     3.375 g 100 mL/hr over 30 Minutes Intravenous  Once 09/08/19 1558 09/08/19 1655       Assessment/Plan Morbid obesity BMI 60.13 DM CAD with cardiac stents in place on plavix Hx ischemic cardiomyopathy Acute on chronic systolic heart failure EF 30-35% Anemia Multifactorial encephalopathy Hypothyroidism HLD AKI on CKD  - Creatinine 1.88(9/28)>>4.13(9/30)>>7.42(10/3)>>6.72(10/6)>>4.92(10/8)   POD 6, s/p incision and debridment of mons soft tissue infection and spontaneously draining left labial abscess, 10/6 Dr. Ninfa Linden Irrigation and debridement of mons abscess 6 x 6 x 8 cm 09/16/2019 DR. Coralie Keens -cont WD dressing changes to this wound.  It is generally clean. -we will see every couple of days.  ID:  Vancomycin 9/28x 2 doses;Rocephin 9/28-10/2, azithromycin 9/28-10/2, clindamycin 9/30-10/2. Currently on Zyvox and Unasyn 10/2-10/7; Doxycycline 10/7>>day 5 VTE -SCDs, sq heparin,last does of plavix 10/02 FEN -IVF,CM Foley -in place Follow up -TBD.   LOS: 14 days    Henreitta Cea , [facility] 09/22/2019, 10:12 AM  Pager: [phone]

## 2019-09-22 NOTE — Progress Notes (Signed)
Patient complaining of difficulty getting comfortable in regular [place] bed.      Paged MD for order for a bariatric bed

## 2019-09-22 NOTE — Progress Notes (Signed)
Patient ID: FUMIE FIALLO, female   DOB: [birth year], 63 y.o.   MRN: [id number] S: Wants to know when she can go home O:BP (!) 153/63 (BP Location: Right Arm)   Pulse 73   Temp 98.7 F (37.1 C) (Oral)   Resp 18   Ht 5\' 5"  (1.651 m)   Wt (!) 161.5 kg   SpO2 97%   BMI 59.24 kg/m   Intake/Output Summary (Last 24 hours) at 09/22/2019 1143 Last data filed at 09/22/2019 0900 Gross per 24 hour  Intake 660 ml  Output 1350 ml  Net -690 ml   Intake/Output: I/O last 3 completed shifts: In: 1570 [P.O.:1560; I.V.:10] Out: 2000 [Urine:2000]  Intake/Output this shift:  Total I/O In: 220 [P.O.:220] Out: -  Weight change: 3.881 kg Gen: NAD, obese CVS: no rub Resp: cta Abd: obese, +BS Ext: trace edema  Recent Labs  Lab 09/16/19 0444 09/17/19 0609 09/18/19 0354 09/20/19 0429 09/21/19 0337 09/22/19 0424  NA 133* 135 136 136 140 140  K 4.3 4.3 4.1 3.8 3.9 3.8  CL 96* 98 99 102 105 109  CO2 16* 19* 20* 22 24 23   GLUCOSE 94 122* 117* 202* 152* 143*  BUN 104* 96* 98* 71* 68* 61*  CREATININE 6.72* 5.47* 4.92* 3.70* 3.38* 3.02*  ALBUMIN 1.5* 1.4* 1.4* 1.7* 1.7* 1.8*  CALCIUM 6.2* 6.9* 6.9* 7.6* 7.6* 7.3*  PHOS 8.3* 7.0* 6.1* 3.7 3.4 2.9   Liver Function Tests: Recent Labs  Lab 09/20/19 0429 09/21/19 0337 09/22/19 0424  ALBUMIN 1.7* 1.7* 1.8*   No results for input(s): LIPASE, AMYLASE in the last 168 hours. No results for input(s): AMMONIA in the last 168 hours. CBC: Recent Labs  Lab 09/17/19 0947 09/18/19 0354 09/19/19 1125 09/20/19 0429 09/21/19 0337  WBC 24.7*  25.5* 20.6* 17.2* 16.3* 11.7*  NEUTROABS 20.2*  --   --   --   --   HGB 8.4*  8.3* 8.2* 8.1* 7.9* 8.1*  HCT 25.7*  26.6* 26.3* 26.9* 25.6* 26.7*  MCV 86.5  87.5 88.0 89.4 90.1 90.2  PLT 263  285 251 226 214 209   Cardiac Enzymes: No results for input(s): CKTOTAL, CKMB, CKMBINDEX, TROPONINI in the last 168 hours. CBG: Recent Labs  Lab 09/21/19 0617 09/21/19 1147 09/21/19 1643 09/21/19 2158  09/22/19 0557  GLUCAP 146* 149* 129* 120* 121*    Iron Studies: No results for input(s): IRON, TIBC, TRANSFERRIN, FERRITIN in the last 72 hours. Studies/Results: No results found. Marland Kitchen atorvastatin  80 mg Oral QPM  . Chlorhexidine Gluconate Cloth  6 each Topical Daily  . darbepoetin (ARANESP) injection - NON-DIALYSIS  60 mcg Subcutaneous Q Mon-1800  . doxycycline  100 mg Oral Q12H  . feeding supplement (NEPRO CARB STEADY)  237 mL Oral BID BM  . heparin injection (subcutaneous)  5,000 Units Subcutaneous Q8H  . insulin aspart  0-9 Units Subcutaneous TID WC  . levofloxacin  500 mg Oral Q48H  . levothyroxine  125 mcg Oral Q0600  . multivitamin  1 tablet Oral QHS  . nystatin   Topical TID  . saccharomyces boulardii  250 mg Oral BID  . sodium chloride flush  10-40 mL Intracatheter Q12H    BMET    Component Value Date/Time   NA 140 09/22/2019 0424   K 3.8 09/22/2019 0424   CL 109 09/22/2019 0424   CO2 23 09/22/2019 0424   GLUCOSE 143 (H) 09/22/2019 0424   BUN 61 (H) 09/22/2019 0424   CREATININE 3.02 (H) 09/22/2019  0424   CALCIUM 7.3 (L) 09/22/2019 0424   GFRNONAA 16 (L) 09/22/2019 0424   GFRAA 19 (L) 09/22/2019 0424   CBC    Component Value Date/Time   WBC 11.7 (H) 09/21/2019 0337   RBC 2.96 (L) 09/21/2019 0337   HGB 8.1 (L) 09/21/2019 0337   HCT 26.7 (L) 09/21/2019 0337   PLT 209 09/21/2019 0337   MCV 90.2 09/21/2019 0337   MCH 27.4 09/21/2019 0337   MCHC 30.3 09/21/2019 0337   RDW 14.6 09/21/2019 0337   LYMPHSABS 2.2 09/17/2019 0947   MONOABS 0.8 09/17/2019 0947   EOSABS 0.6 (H) 09/17/2019 0947   BASOSABS 0.0 09/17/2019 0947     Assessment/Plan:  1. AKI- presumably due to combination of ischemic ATN as well as vancomycin nephrotoxicity.  She underwent HD on 10/6 and again on 09/18/19 with nontunneled HD catheter.  Since then, her UOP has improved as well as her BUN/Cr.  Will hold off on further HD and will d/c HD catheter if Cr is lower tomorrow. 2. Left labial  abscess with sepsis- improving with antibiotics and wound care/drainage. 3. AMS- improving.  Multifactorial with sepsis and AKI/meds 4. Vascular access- temp RIJ HD cath in place.  Will d/c tomorrow if Scr cont to improve or remains stable 5. Anemia- due to acute/critical illness.  Aranesp started on 09/15/19 and s/p feraheme.  Hgb stable.  Transfuse prn.  Donetta Potts, MD [employer] [phone]

## 2019-09-23 DIAGNOSIS — N7689 Other specified inflammation of vagina and vulva: Secondary | ICD-10-CM | POA: Diagnosis not present

## 2019-09-23 LAB — CBC
HCT: 27 % — ABNORMAL LOW (ref 36.0–46.0)
Hemoglobin: 8.4 g/dL — ABNORMAL LOW (ref 12.0–15.0)
MCH: 28.1 pg (ref 26.0–34.0)
MCHC: 31.1 g/dL (ref 30.0–36.0)
MCV: 90.3 fL (ref 80.0–100.0)
Platelets: 190 10*3/uL (ref 150–400)
RBC: 2.99 MIL/uL — ABNORMAL LOW (ref 3.87–5.11)
RDW: 15.3 % (ref 11.5–15.5)
WBC: 8.9 10*3/uL (ref 4.0–10.5)
nRBC: 0 % (ref 0.0–0.2)

## 2019-09-23 LAB — RENAL FUNCTION PANEL
Albumin: 1.9 g/dL — ABNORMAL LOW (ref 3.5–5.0)
Anion gap: 11 (ref 5–15)
BUN: 50 mg/dL — ABNORMAL HIGH (ref 8–23)
CO2: 23 mmol/L (ref 22–32)
Calcium: 7.6 mg/dL — ABNORMAL LOW (ref 8.9–10.3)
Chloride: 109 mmol/L (ref 98–111)
Creatinine, Ser: 2.34 mg/dL — ABNORMAL HIGH (ref 0.44–1.00)
GFR calc Af Amer: 25 mL/min — ABNORMAL LOW (ref >60)
GFR calc non Af Amer: 22 mL/min — ABNORMAL LOW (ref >60)
Glucose, Bld: 106 mg/dL — ABNORMAL HIGH (ref 70–99)
Phosphorus: 3.2 mg/dL (ref 2.5–4.6)
Potassium: 3.7 mmol/L (ref 3.5–5.1)
Sodium: 143 mmol/L (ref 135–145)

## 2019-09-23 LAB — GLUCOSE, CAPILLARY
Glucose-Capillary: 103 mg/dL — ABNORMAL HIGH (ref 70–99)
Glucose-Capillary: 140 mg/dL — ABNORMAL HIGH (ref 70–99)
Glucose-Capillary: 156 mg/dL — ABNORMAL HIGH (ref 70–99)
Glucose-Capillary: 135 mg/dL — ABNORMAL HIGH (ref 70–99)

## 2019-09-23 MED ORDER — ENSURE MAX PROTEIN PO LIQD
11.0000 [oz_av] | Freq: Every day | ORAL | Status: DC
  Filled 2019-09-23 (×4): qty 330

## 2019-09-23 NOTE — Progress Notes (Signed)
Foley cath d/c no complication. Will continue to monitor the patient.

## 2019-09-23 NOTE — Progress Notes (Signed)
Rehab Admissions Coordinator Note:  Patient was screened by Michel Santee for appropriateness for an Inpatient Acute Rehab Consult.  At this time, we are recommending Inpatient Rehab consult. Please place a consult order if pt would like to be considered.   Michel Santee 09/23/2019, 1:14 PM  I can be reached at [phone].

## 2019-09-23 NOTE — Progress Notes (Signed)
Nutrition Follow-up  RD working remotely.  DOCUMENTATION CODES:   Morbid obesity  INTERVENTION:   -D/c Nepro Shake po BID, each supplement provides 425 kcal and 19 grams protein -Continue renal MVI daily -Ensure Max po daily, each supplement provides 150 kcal and 30 grams of protein.  -Continue double protein portions at meals  NUTRITION DIAGNOSIS:   Inadequate oral intake related to acute illness(pneumonia; abscess in left labia/upper thigh area) as evidenced by meal completion < 25%.  Ongoing  GOAL:   Patient will meet greater than or equal to 90% of their needs  Progressing   MONITOR:   PO intake, Labs, I & O's, Weight trends, Supplement acceptance, Skin  REASON FOR ASSESSMENT:   Consult Assessment of nutrition requirement/status  ASSESSMENT:   63 year old female with medical history significant for morbid obesity, CHF, hypothyroidism, HTN, OSA, CAD, T2DM, and arthritis who presented to ED with complaints of generalized weakness, severe dry cough, difficulty voiding, poor appetite, and difficulty sleeping for the past 3 days. Patient seen in ED 9/26 for abscess in left labia/upper thigh area that has been draining large amounts of pus; patient discharged on doxycyline and pain medication. Portable CXR showed multifocal pneumonia underlying pulmonary vascular congestion  10/1- transferred from [facility] to [facility] for surgery evaluation 10/2- surgery cancelled 2/2 plavix 10/6- s/p tunnelled catheter placement and HD initiation ; s/p IRRIGATION AND DEBRIDEMENTMONS ABSCESS (6 CM X 6 CM X 8 CM) 10/13- HD cath d/c  Reviewed I/O's: -20 ml x 24 hours and -1.6 L since 09/09/19  UOP: 600 ml x 24 hours  Pt's intake has improved, noted meal completion 25-100%. Pt refusing Nepro supplements.   Per general surgery notes, no plans for further I&D, however, continue aggressive wound care.   Per nephrology notes, pt last HD 09/18/19. Kidney function improving and making urine and HD  cath d/c today.   Per CSW notes, plan for SNF once medically stable.   Labs reviewed: CBGS: 103-130 (inpatient orders for glycemic control are 0-9 units insulin aspart TID with meals).   Diet Order:   Diet Order            Diet renal/carb modified with fluid restriction Diet-HS Snack? Nothing; Fluid restriction: 1200 mL Fluid; Room service appropriate? Yes; Fluid consistency: Thin  Diet effective now              EDUCATION NEEDS:   Not appropriate for education at this time  Skin:  Skin Assessment: Skin Integrity Issues: Skin Integrity Issues:: Stage II Stage II: lt buttocks Other: lt labial abscess concerning for Fournier's gangrene  Last BM:  09/22/19  Height:   Ht Readings from Last 1 Encounters:  09/08/19 5\' 5"  (1.651 m)    Weight:   Wt Readings from Last 1 Encounters:  09/23/19 (!) 158.7 kg    Ideal Body Weight:  56.8 kg  BMI:  Body mass index is 58.21 kg/m.  Estimated Nutritional Needs:   Kcal:  1800-2000  Protein:  120-140 grams  Fluid:  1000 ml + UOP    Montgomery Rothlisberger A. Jimmye Norman, RD, LDN, [place] Registered Dietitian II Certified Diabetes Care and Education Specialist Pager: [phone] After hours Pager: [phone]

## 2019-09-23 NOTE — Progress Notes (Signed)
Sterile foley care and peri care done

## 2019-09-23 NOTE — Progress Notes (Addendum)
Physical Therapy Treatment Patient Details Name: Maureen Jackson MRN: [id number] DOB: Dec 28, 1955 Today's Date: 09/23/2019    History of Present Illness Pt is a 63 y.o. female admitted 09/08/19 with weakness, difficulty voiding and poor appetite. Had recent I&D of left labia upper thigh abscess in ED on 9/26. Pt with worsening renal function. CT 10/5 with concern for Fournier gangrene. S/p RIJ non-tunneled cath placement. S/p I&D of labia abscess 10/6. PMH includes morbid obesity, HF, HTN, CAD, OSA.    PT Comments    Patient progressing well this session able to ambulate around bed with RW and 1 person A (2 in room for safety).  She is eager to progress at this point and asked when PT would return. NT assisted pt up to chair today.  Feel she could benefit and progress well in CIR level rehab prior to d/c home.  PT to follow acutely.     Follow Up Recommendations  CIR     Equipment Recommendations  Wheelchair (measurements PT);Wheelchair cushion (measurements PT);Rolling walker with 5" wheels    Recommendations for Other Services Rehab consult     Precautions / Restrictions Precautions Precautions: Fall Precaution Comments: R IJ HD catheter, abscess L thigh & labia    Mobility  Bed Mobility Overal bed mobility: Needs Assistance Bed Mobility: Sit to Supine       Sit to supine: Mod assist;+2 for safety/equipment   General bed mobility comments: assist for feet into bed, for scooting/positioning in supine  Transfers Overall transfer level: Needs assistance Equipment used: Rolling walker (2 wheeled) Transfers: Sit to/from Stand Sit to Stand: Mod assist;Max assist;+2 physical assistance         General transfer comment: heavy lifting help from recliner and from EOB with elevated height; once able to get legs extended less help needed; used momentum  Ambulation/Gait Ambulation/Gait assistance: Mod assist;+2 safety/equipment Gait Distance (Feet): 15 Feet Assistive device:  Rolling walker (2 wheeled) Gait Pattern/deviations: Step-to pattern;Step-through pattern;Shuffle;Decreased stride length     General Gait Details: knees buckling some, assist for safety, cues for using walker as pt reaching for foot board walking around bed, SOB and fatigued quickly; stood second time from bed to side step toward [place]             Wheelchair Mobility    Modified Rankin (Stroke Patients Only)       Balance Overall balance assessment: Needs assistance Sitting-balance support: Feet supported Sitting balance-Leahy Scale: Poor Sitting balance - Comments: difficulty sitting EOB due to pain in wound with leaning forward on air bed when mattress deflated   Standing balance support: Bilateral upper extremity supported Standing balance-Leahy Scale: Poor Standing balance comment: UE support and assist for balance due to LE weakness                            Cognition Arousal/Alertness: Awake/alert Behavior During Therapy: Flat affect Overall Cognitive Status: Within Functional Limits for tasks assessed                                        Exercises      General Comments General comments (skin integrity, edema, etc.): HT max 107, Dyspnea 3/4 with in room ambulation on RA      Pertinent Vitals/Pain Pain Assessment: Faces Faces Pain Scale: Hurts little more Pain Location: Sore with packing  in wound when sitting and leaning forward Pain Descriptors / Indicators: Discomfort Pain Intervention(s): Monitored during session;Repositioned    Home Living                      Prior Function            PT Goals (current goals can now be found in the care plan section) Progress towards PT goals: Progressing toward goals    Frequency    Min 3X/week      PT Plan Discharge plan needs to be updated;Frequency needs to be updated    Co-evaluation              AM-PAC PT "6 Clicks" Mobility   Outcome  Measure  Help needed turning from your back to your side while in a flat bed without using bedrails?: A Little Help needed moving from lying on your back to sitting on the side of a flat bed without using bedrails?: A Lot Help needed moving to and from a bed to a chair (including a wheelchair)?: A Lot Help needed standing up from a chair using your arms (e.g., wheelchair or bedside chair)?: Total Help needed to walk in hospital room?: A Lot Help needed climbing 3-5 steps with a railing? : Total 6 Click Score: 11    End of Session Equipment Utilized During Treatment: Gait belt Activity Tolerance: Patient limited by fatigue Patient left: in bed;with call bell/phone within reach Nurse Communication: Mobility status PT Visit Diagnosis: Other abnormalities of gait and mobility (R26.89);Muscle weakness (generalized) (M62.81)     Time: 1031-1100 PT Time Calculation (min) (ACUTE ONLY): 29 min  Charges:  $Gait Training: 8-22 mins $Therapeutic Activity: 8-22 mins                     [name], [place] [facility] [phone] 09/23/2019    [name] 09/23/2019, 1:13 PM

## 2019-09-23 NOTE — Progress Notes (Signed)
Pt resting in bed, c/o generalized itching benadryl 25mg  PO given. Pt refused wound dressing tonight wants it done in the morning. Will do wound care at 0500am.

## 2019-09-23 NOTE — Care Management Important Message (Signed)
Important Message  Patient Details  Name: Maureen Jackson MRN: [id number] Date of Birth: [birth year]   Medicare Important Message Given:  Yes     Shelda Altes 09/23/2019, 12:49 PM

## 2019-09-23 NOTE — Progress Notes (Signed)
PROGRESS NOTE    Maureen Jackson  GYK:[id number] DOB: 08-May-1956 DOA: 09/08/2019 PCP: Neale Burly, MD   Brief Narrative: 63 year old with past medical history significant for morbid obesity, systolic heart failure, hypothyroidism, hypertension, CAD and obstructive sleep apnea.  Presents with  generalized weakness, difficulty voiding, poor appetite and insomnia for 3 days.  September 26,  2 days prior to hospitalization she was seen in the emergency department for left labia upper thigh abscess which was drained.  Discharge home on oral antibiotics.  Patient had worsening renal function in the setting of concern for Fournier gangrene, patient was transferred from [facility]  to [facility] for further management.    Assessment & Plan:   Principal Problem:   Fournier's gangrene in female Active Problems:   Hypothyroidism   Essential hypertension   Morbid obesity/BMI > 55   Diabetes mellitus type 2 with complications, uncontrolled (HCC)   CAD, multiple vessel   Ischemic cardiomyopathy   Chronic combined systolic and diastolic CHF (congestive heart failure) (EF 30 to 35 %)   CAP (community acquired pneumonia)   Labial abscess   Abscess   Fistula   AMS (altered mental status)   Drug rash   Acute renal failure (ARF) (HCC)   Pressure injury of skin  1-Labial abscess/Fournier gangrene complicated with sepsis/acute metabolic encephalopathy -Patient presented with leukocytosis, fever post sediment in F[place].  Repeat CT scan done on 10/5 findings worrisome for Fournier gangrene: Patient was taken to the OR by general surgery on 10/6. -ID has been consulted and has been helping with antibiotics: -Patient developed rash today, antibiotics has been changed from Zyvox, Unasyn  to Levaquin, and Doxycycline.  -Surgery recommending wet-to-dry dressing twice a day -Per surgery no need to go to OR> per sx continue with Wound care.  -Continue with levaquin and doxy for 10 days from 10-06/--last day  10-16 -Continue with wound care.   2-Rash; Benadryl cream and oral benadryl PRN.  Unasyn and Zyvox discontinue.  Plan to stop  CHG baths. Stable.   3-AKI;, volume overload non-anion gap metabolic acidosis.  Suspected acute tubular necrosis, oliguric hypocalcemia hyperphosphatemia: Nephrology consulted, patient was a started on hemodialysis on 10/6 Had HD on 10-08. Plan to monitor renal function.  Cr trending down, urine out put improved.  Urine out put 600 probably not accurate.  Cr decreased to 2.3. renal recommend to discontinue  HD catheter. Renal sign off.   4-Acute on chronic systolic heart failure exacerbation, ejection fraction 30 to 35%: Ischemic cardiomyopathy status post DES to LAD and RCA 2018.  Volume control with hemodialysis Resume aspirin/ plavix.  5-Diabetes: Continue with sliding scale insulin  6-Morbid obesity with OSA: Continue with CPAP Body mass index 60  Hypothyroidism Continue with Synthroid.  Acute metabolic encephalopathy: Related to acute illness renal failure sepsis infection.   More alert,      Nutrition Problem: Inadequate oral intake Etiology: acute illness(pneumonia; abscess in left labia/upper thigh area)    Signs/Symptoms: meal completion < 25%    Interventions: MVI, Refer to RD note for recommendations  Estimated body mass index is 58.21 kg/m as calculated from the following:   Height as of this encounter: 5\' 5"  (1.651 m).   Weight as of this encounter: 158.7 kg.   DVT prophylaxis: Lovenox Code Status: Full code Family Communication: Daughter.  Disposition Plan: remain in hospital for HD, renal failure, Consultants:   surgery  ID  Procedures:   Irrigation and debridement months abscess 6 x 6 by  day 10/6  Antimicrobials:  Levaquin and doxy Received Zyvox, Unasyn  Subjective: Alert, denies pain   Objective: Vitals:   09/23/19 0622 09/23/19 0804 09/23/19 1147 09/23/19 1543  BP: 124/60 (!) 159/81 (!) 174/71 (!)  175/73  Pulse: 62 73 76 72  Resp: 18 18 18 18   Temp: 97.7 F (36.5 C) 98.2 F (36.8 C) 97.7 F (36.5 C) 97.7 F (36.5 C)  TempSrc: Oral Oral Oral Oral  SpO2: 96% 98% 99% 98%  Weight:      Height:        Intake/Output Summary (Last 24 hours) at 09/23/2019 1609 Last data filed at 09/23/2019 1500 Gross per 24 hour  Intake 700 ml  Output 1000 ml  Net -300 ml   Filed Weights   09/22/19 0400 09/22/19 0411 09/23/19 0621  Weight: (!) 161.5 kg (!) 161.5 kg (!) 158.7 kg    Examination:  General exam: Alert, morbid obese Respiratory system: CTA Cardiovascular system: S 1, S 2 RRR Gastrointestinal system: BS present, soft, nt Central nervous system: alert Extremities: trace edema Skin: Rash abdominal wall improved    Data Reviewed: I have personally reviewed following labs and imaging studies  CBC: Recent Labs  Lab 09/17/19 0947 09/18/19 0354 09/19/19 1125 09/20/19 0429 09/21/19 0337 09/23/19 0416  WBC 24.7*  25.5* 20.6* 17.2* 16.3* 11.7* 8.9  NEUTROABS 20.2*  --   --   --   --   --   HGB 8.4*  8.3* 8.2* 8.1* 7.9* 8.1* 8.4*  HCT 25.7*  26.6* 26.3* 26.9* 25.6* 26.7* 27.0*  MCV 86.5  87.5 88.0 89.4 90.1 90.2 90.3  PLT 263  285 251 226 214 209 333   Basic Metabolic Panel: Recent Labs  Lab 09/18/19 0354 09/20/19 0429 09/21/19 0337 09/22/19 0424 09/23/19 0416  NA 136 136 140 140 143  K 4.1 3.8 3.9 3.8 3.7  CL 99 102 105 109 109  CO2 20* 22 24 23 23   GLUCOSE 117* 202* 152* 143* 106*  BUN 98* 71* 68* 61* 50*  CREATININE 4.92* 3.70* 3.38* 3.02* 2.34*  CALCIUM 6.9* 7.6* 7.6* 7.3* 7.6*  PHOS 6.1* 3.7 3.4 2.9 3.2   GFR: Estimated Creatinine Clearance: 38.9 mL/min (A) (by C-G formula based on SCr of 2.34 mg/dL (H)). Liver Function Tests: Recent Labs  Lab 09/18/19 0354 09/20/19 0429 09/21/19 0337 09/22/19 0424 09/23/19 0416  ALBUMIN 1.4* 1.7* 1.7* 1.8* 1.9*   No results for input(s): LIPASE, AMYLASE in the last 168 hours. No results for input(s):  AMMONIA in the last 168 hours. Coagulation Profile: No results for input(s): INR, PROTIME in the last 168 hours. Cardiac Enzymes: No results for input(s): CKTOTAL, CKMB, CKMBINDEX, TROPONINI in the last 168 hours. BNP (last 3 results) No results for input(s): PROBNP in the last 8760 hours. HbA1C: No results for input(s): HGBA1C in the last 72 hours. CBG: Recent Labs  Lab 09/22/19 1121 09/22/19 1634 09/22/19 2135 09/23/19 0624 09/23/19 1056  GLUCAP 112* 128* 130* 103* 140*   Lipid Profile: No results for input(s): CHOL, HDL, LDLCALC, TRIG, CHOLHDL, LDLDIRECT in the last 72 hours. Thyroid Function Tests: No results for input(s): TSH, T4TOTAL, FREET4, T3FREE, THYROIDAB in the last 72 hours. Anemia Panel: No results for input(s): VITAMINB12, FOLATE, FERRITIN, TIBC, IRON, RETICCTPCT in the last 72 hours. Sepsis Labs: No results for input(s): PROCALCITON, LATICACIDVEN in the last 168 hours.  No results found for this or any previous visit (from the past 240 hour(s)).       Radiology  Studies: No results found.      Scheduled Meds: . aspirin EC  81 mg Oral Daily  . atorvastatin  80 mg Oral QPM  . Chlorhexidine Gluconate Cloth  6 each Topical Daily  . clopidogrel  75 mg Oral q morning - 10a  . darbepoetin (ARANESP) injection - NON-DIALYSIS  60 mcg Subcutaneous Q Mon-1800  . doxycycline  100 mg Oral Q12H  . feeding supplement (NEPRO CARB STEADY)  237 mL Oral BID BM  . heparin injection (subcutaneous)  5,000 Units Subcutaneous Q8H  . insulin aspart  0-9 Units Subcutaneous TID WC  . levofloxacin  500 mg Oral Q48H  . levothyroxine  125 mcg Oral Q0600  . multivitamin  1 tablet Oral QHS  . nystatin   Topical TID  . saccharomyces boulardii  250 mg Oral BID  . sodium chloride flush  10-40 mL Intracatheter Q12H   Continuous Infusions: . sodium chloride       LOS: 15 days    Time spent: 35 minutes.     Elmarie Shiley, MD [facility] Pager [phone]   If 7PM-7AM, please contact night-coverage [email or web address] Password [email or web address] 09/23/2019, 4:09 PM

## 2019-09-23 NOTE — TOC Progression Note (Addendum)
Transition of Care [facility]) - Progression Note    Patient Details  Name: Maureen Jackson MRN: [id number] Date of Birth: [birth year]  [facility] CM/SW Contact  Zenon Mayo, RN Phone Number: 09/23/2019, 7:14 PM  Clinical Narrative:    From home with daughter, NCM spoke with patient and she is agreeable to SNF work up, she gives this NCM permission to fax her information out to the SNF's in [place].  10/9 Tomi Bamberger RN, BSN - patient had bed offer from [facility], she states she does not want to go to [place] is not accepting patients because they have covid patients.  MD notified  10/13 Tomi Bamberger RN, BSN- NCM spoke with patient , she is interested in CIR, NCM informed MD, order in for CIR consult.  NCM asked patient what is her other option if this is not approved by her insurance, she states she would like to go home, she does not want to go to SNF.  She states she had [facility] in the past.   10/14 Tomi Bamberger RN,  BSN- CIR consult, daughter to make sure patient has wound care if she has a CIR stay per Claiborne Billings with CIR. Daughter is Cathy [phone].    Expected Discharge Plan: Skilled Nursing Facility Barriers to Discharge: No Barriers Identified  Expected Discharge Plan and Services Expected Discharge Plan: [place] In-house Referral: NA Discharge Planning Services: CM Consult Post Acute Care Choice: [place] Living arrangements for the past 2 months: Apartment                 DME Arranged: (NA)         HH Arranged: NA           Social Determinants of Health (SDOH) Interventions    Readmission Risk Interventions Readmission Risk Prevention Plan 09/18/2019  Transportation Screening Complete  HRI or [place] Complete  Social Work Consult for [place] Planning/Counseling Complete  Palliative Care Screening Not Applicable  Medication Review Human resources officer) Complete  Some recent data might be hidden

## 2019-09-23 NOTE — Progress Notes (Signed)
Pt c/o that foley catheter strap making her itch, requested to take it out. Will order stat lock.

## 2019-09-23 NOTE — Procedures (Signed)
Patient refused CPAP tonight 

## 2019-09-23 NOTE — Progress Notes (Signed)
Patient ID: ZALEA PETE, female   DOB: 15-Mar-1956, 63 y.o.   MRN: [id number] S: Feels better today O:BP (!) 159/81 (BP Location: Right Arm)   Pulse 73   Temp 98.2 F (36.8 C) (Oral)   Resp 18   Ht 5\' 5"  (1.651 m)   Wt (!) 158.7 kg   SpO2 98%   BMI 58.21 kg/m   Intake/Output Summary (Last 24 hours) at 09/23/2019 1108 Last data filed at 09/23/2019 0900 Gross per 24 hour  Intake 580 ml  Output 600 ml  Net -20 ml   Intake/Output: I/O last 3 completed shifts: In: 53 [P.O.:780] Out: 1400 [Urine:1400]  Intake/Output this shift:  Total I/O In: 220 [P.O.:220] Out: -  Weight change: -2.812 kg Gen: NAD CVS: no rub Resp: cta Abd: obese, +BS, soft, NT Ext: no edema  Recent Labs  Lab 09/17/19 0609 09/18/19 0354 09/20/19 0429 09/21/19 0337 09/22/19 0424 09/23/19 0416  NA 135 136 136 140 140 143  K 4.3 4.1 3.8 3.9 3.8 3.7  CL 98 99 102 105 109 109  CO2 19* 20* 22 24 23 23   GLUCOSE 122* 117* 202* 152* 143* 106*  BUN 96* 98* 71* 68* 61* 50*  CREATININE 5.47* 4.92* 3.70* 3.38* 3.02* 2.34*  ALBUMIN 1.4* 1.4* 1.7* 1.7* 1.8* 1.9*  CALCIUM 6.9* 6.9* 7.6* 7.6* 7.3* 7.6*  PHOS 7.0* 6.1* 3.7 3.4 2.9 3.2   Liver Function Tests: Recent Labs  Lab 09/21/19 0337 09/22/19 0424 09/23/19 0416  ALBUMIN 1.7* 1.8* 1.9*   No results for input(s): LIPASE, AMYLASE in the last 168 hours. No results for input(s): AMMONIA in the last 168 hours. CBC: Recent Labs  Lab 09/17/19 0947 09/18/19 0354 09/19/19 1125 09/20/19 0429 09/21/19 0337 09/23/19 0416  WBC 24.7*  25.5* 20.6* 17.2* 16.3* 11.7* 8.9  NEUTROABS 20.2*  --   --   --   --   --   HGB 8.4*  8.3* 8.2* 8.1* 7.9* 8.1* 8.4*  HCT 25.7*  26.6* 26.3* 26.9* 25.6* 26.7* 27.0*  MCV 86.5  87.5 88.0 89.4 90.1 90.2 90.3  PLT 263  285 251 226 214 209 190   Cardiac Enzymes: No results for input(s): CKTOTAL, CKMB, CKMBINDEX, TROPONINI in the last 168 hours. CBG: Recent Labs  Lab 09/22/19 0557 09/22/19 1121 09/22/19 1634  09/22/19 2135 09/23/19 0624  GLUCAP 121* 112* 128* 130* 103*    Iron Studies: No results for input(s): IRON, TIBC, TRANSFERRIN, FERRITIN in the last 72 hours. Studies/Results: No results found. Marland Kitchen aspirin EC  81 mg Oral Daily  . atorvastatin  80 mg Oral QPM  . Chlorhexidine Gluconate Cloth  6 each Topical Daily  . clopidogrel  75 mg Oral q morning - 10a  . darbepoetin (ARANESP) injection - NON-DIALYSIS  60 mcg Subcutaneous Q Mon-1800  . doxycycline  100 mg Oral Q12H  . feeding supplement (NEPRO CARB STEADY)  237 mL Oral BID BM  . heparin injection (subcutaneous)  5,000 Units Subcutaneous Q8H  . insulin aspart  0-9 Units Subcutaneous TID WC  . levofloxacin  500 mg Oral Q48H  . levothyroxine  125 mcg Oral Q0600  . multivitamin  1 tablet Oral QHS  . nystatin   Topical TID  . saccharomyces boulardii  250 mg Oral BID  . sodium chloride flush  10-40 mL Intracatheter Q12H    BMET    Component Value Date/Time   NA 143 09/23/2019 0416   K 3.7 09/23/2019 0416   CL 109 09/23/2019 0416  CO2 23 09/23/2019 0416   GLUCOSE 106 (H) 09/23/2019 0416   BUN 50 (H) 09/23/2019 0416   CREATININE 2.34 (H) 09/23/2019 0416   CALCIUM 7.6 (L) 09/23/2019 0416   GFRNONAA 22 (L) 09/23/2019 0416   GFRAA 25 (L) 09/23/2019 0416   CBC    Component Value Date/Time   WBC 8.9 09/23/2019 0416   RBC 2.99 (L) 09/23/2019 0416   HGB 8.4 (L) 09/23/2019 0416   HCT 27.0 (L) 09/23/2019 0416   PLT 190 09/23/2019 0416   MCV 90.3 09/23/2019 0416   MCH 28.1 09/23/2019 0416   MCHC 31.1 09/23/2019 0416   RDW 15.3 09/23/2019 0416   LYMPHSABS 2.2 09/17/2019 0947   MONOABS 0.8 09/17/2019 0947   EOSABS 0.6 (H) 09/17/2019 0947   BASOSABS 0.0 09/17/2019 0947    Assessment/Plan:  1. AKI- presumably due to combination of ischemic ATN as well as vancomycin nephrotoxicity.  She underwent HD on 10/6 and again on 09/18/19 with nontunneled HD catheter.  Since then, her UOP has improved as well as her BUN/Cr.   1. Will d/c  HD catheter  2. Scr continues to improve and no indication for dialysis 3. Nothing further to add.  Will sign off.  Please call with questions or concerns. 4. if Cr does not return to normal after discharge, she can follow up in our office, otherwise f/u with her PCP (Cr was 0.9 prior to admission) 2. Left labial abscess with sepsis- improving with antibiotics and wound care/drainage. 3. AMS- improving.  Multifactorial with sepsis and AKI/meds 4. Vascular access- temp RIJ HD cath in place.  Will d/c today 5. Anemia- due to acute/critical illness.  Aranesp started on 09/15/19 and s/p feraheme.  Hgb stable.  Transfuse prn.  Donetta Potts, MD [employer] [phone]

## 2019-09-24 LAB — GLUCOSE, CAPILLARY
Glucose-Capillary: 115 mg/dL — ABNORMAL HIGH (ref 70–99)
Glucose-Capillary: 207 mg/dL — ABNORMAL HIGH (ref 70–99)
Glucose-Capillary: 145 mg/dL — ABNORMAL HIGH (ref 70–99)
Glucose-Capillary: 149 mg/dL — ABNORMAL HIGH (ref 70–99)

## 2019-09-24 LAB — CBC
HCT: 26.8 % — ABNORMAL LOW (ref 36.0–46.0)
Hemoglobin: 8.1 g/dL — ABNORMAL LOW (ref 12.0–15.0)
MCH: 27.4 pg (ref 26.0–34.0)
MCHC: 30.2 g/dL (ref 30.0–36.0)
MCV: 90.5 fL (ref 80.0–100.0)
Platelets: 198 10*3/uL (ref 150–400)
RBC: 2.96 MIL/uL — ABNORMAL LOW (ref 3.87–5.11)
RDW: 15.7 % — ABNORMAL HIGH (ref 11.5–15.5)
WBC: 7 10*3/uL (ref 4.0–10.5)
nRBC: 0 % (ref 0.0–0.2)

## 2019-09-24 LAB — RENAL FUNCTION PANEL
Albumin: 1.9 g/dL — ABNORMAL LOW (ref 3.5–5.0)
Anion gap: 10 (ref 5–15)
BUN: 39 mg/dL — ABNORMAL HIGH (ref 8–23)
CO2: 22 mmol/L (ref 22–32)
Calcium: 7.5 mg/dL — ABNORMAL LOW (ref 8.9–10.3)
Chloride: 112 mmol/L — ABNORMAL HIGH (ref 98–111)
Creatinine, Ser: 2.15 mg/dL — ABNORMAL HIGH (ref 0.44–1.00)
GFR calc Af Amer: 28 mL/min — ABNORMAL LOW (ref >60)
GFR calc non Af Amer: 24 mL/min — ABNORMAL LOW (ref >60)
Glucose, Bld: 134 mg/dL — ABNORMAL HIGH (ref 70–99)
Phosphorus: 2.9 mg/dL (ref 2.5–4.6)
Potassium: 3.8 mmol/L (ref 3.5–5.1)
Sodium: 144 mmol/L (ref 135–145)

## 2019-09-24 NOTE — Consult Note (Signed)
Inpatient Rehab Admissions:  Inpatient Rehab Consult received.  I met with pt and her daugther at the bedside for rehabilitation assessment. Pt motivated for a return to independence but understands she has an extensive wound and that will take time to heal. Pt interested in CIR as that will help her return home quickly but support at DC is a current barrier. At this time, her and her daughter are unsure of who would be able to do the dressing changes at DC. Will need to confirm DC support prior to pursing CIR.  Pt's daughter plans to call me regarding decision (CIR vs SNF) later today.   Jhonnie Garner, OTR/L  Rehab Admissions Coordinator  [phone] 09/24/2019 12:48 PM

## 2019-09-24 NOTE — Plan of Care (Signed)
  Problem: Education: Goal: Knowledge of General Education information will improve Description Including pain rating scale, medication(s)/side effects and non-pharmacologic comfort measures Outcome: Progressing   

## 2019-09-24 NOTE — Progress Notes (Signed)
PROGRESS NOTE    Maureen Jackson  NLG:[id number] DOB: 12-14-1955 DOA: 09/08/2019 PCP: Neale Burly, MD   Brief Narrative: 63 year old with past medical history significant for morbid obesity, systolic heart failure, hypothyroidism, hypertension, CAD and obstructive sleep apnea.  Presents with  generalized weakness, difficulty voiding, poor appetite and insomnia for 3 days.  September 26,  2 days prior to hospitalization she was seen in the emergency department for left labia upper thigh abscess which was drained.  Discharge home on oral antibiotics.  Patient had worsening renal function in the setting of concern for Fournier gangrene, patient was transferred from [place]  to [facility] for further management.    Assessment & Plan:   Principal Problem:   Fournier's gangrene in female Active Problems:   Hypothyroidism   Essential hypertension   Morbid obesity/BMI > 55   Diabetes mellitus type 2 with complications, uncontrolled (HCC)   CAD, multiple vessel   Ischemic cardiomyopathy   Chronic combined systolic and diastolic CHF (congestive heart failure) (EF 30 to 35 %)   CAP (community acquired pneumonia)   Labial abscess   Abscess   Fistula   AMS (altered mental status)   Drug rash   Acute renal failure (ARF) (HCC)   Pressure injury of skin  1-Labial abscess/Fournier gangrene complicated with sepsis/acute metabolic encephalopathy Patient presented with leukocytosis, fever post sediment in [place].  Repeat CT scan done on 10/5 findings worrisome for Fournier gangrene: Patient was taken to the OR by general surgery on 10/6. -ID has been consulted and has been helping with antibiotics, currently signed off -Patient developed rash today, antibiotics has been changed from Zyvox, Unasyn  to Levaquin, and Doxycycline.  -Surgery recommending wet-to-dry dressing twice a day -Per surgery no need to go to OR> per sx continue with Wound care.  -Continue with levaquin and doxy for 10 days  from 10-06/--last day 10-16 -Continue with wound care.   2-Rash; Benadryl cream and oral benadryl PRN.  Unasyn and Zyvox discontinue.,  Antibiotics as above.  Plan to stop CHG baths. Stable.   3-AKI;, volume overload non-anion gap metabolic acidosis.  Suspected acute tubular necrosis, oliguric hypocalcemia hyperphosphatemia: Nephrology consulted, patient was a started on hemodialysis on 10/6 Had HD on 10-08. Plan to monitor renal function.  Cr trending down, urine out put improved.  Cr decreased to 2.3->2.15. renal recommend to discontinue  HD catheter. Renal sign off yesterday.   4-Acute on chronic systolic heart failure exacerbation, ejection fraction 30 to 35%: Ischemic cardiomyopathy status post DES to LAD and RCA 2018.  Volume controlled with hemodialysis Continue aspirin/ plavix -Monitor volume status and consider diuresis  5-Diabetes: Continue with sliding scale insulin  6-Morbid obesity with OSA: Continue with CPAP Body mass index 60  Hypothyroidism Continue with Synthroid.  Acute metabolic encephalopathy: Resolved.  Related to acute illness renal failure sepsis infection.        Nutrition Problem: Inadequate oral intake Etiology: acute illness(pneumonia; abscess in left labia/upper thigh area)    Signs/Symptoms: meal completion < 25%    Interventions: MVI, Refer to RD note for recommendations  Estimated body mass index is 68.56 kg/m as calculated from the following:   Height as of this encounter: 5\' 5"  (1.651 m).   Weight as of this encounter: 186.9 kg.   DVT prophylaxis: Heparin Code Status: Full code Disposition Plan: Pending placement.  CIR versus SNF Consultants:   surgery  ID  Nephrology  Procedures:   Irrigation and debridement months abscess 6 x  6 by day 10/6  Antimicrobials:  Levaquin and doxy Received Zyvox, Unasyn  Subjective: Patient seen and examined at bedside no acute distress and resting comfortably.  No events overnight.   Tolerating diet.  Denies any chest pain, shortness of breath, fever, nausea, vomiting, urinary complaints.  Admits to having bowel movement.  Otherwise ROS negative   Objective: Vitals:   09/24/19 0603 09/24/19 0818 09/24/19 1128 09/24/19 1706  BP: 136/80 134/73 (!) 129/51 (!) 141/64  Pulse: 71 84 78 72  Resp: 17 20 18 16   Temp: 98.6 F (37 C) 98.3 F (36.8 C) 98.7 F (37.1 C) 98.4 F (36.9 C)  TempSrc: Oral Oral Oral Oral  SpO2: 97% 100% 98% 100%  Weight:      Height:        Intake/Output Summary (Last 24 hours) at 09/24/2019 1913 Last data filed at 09/24/2019 1815 Gross per 24 hour  Intake 957 ml  Output 301 ml  Net 656 ml   Filed Weights   09/22/19 0411 09/23/19 0621 09/24/19 0556  Weight: (!) 161.5 kg (!) 158.7 kg (!) 186.9 kg    Examination:  General exam: Alert and oriented.  Morbid obesity Respiratory system: Clear to auscultation bilaterally Cardiovascular system: S 1, S 2 RRR Gastrointestinal system: BS present, soft, nt Central nervous system: alert Extremities: trace edema Skin: Rash abdominal wall improved GU dressing clean dry and intact over infection    Data Reviewed: I have personally reviewed following labs and imaging studies  CBC: Recent Labs  Lab 09/19/19 1125 09/20/19 0429 09/21/19 0337 09/23/19 0416 09/24/19 0546  WBC 17.2* 16.3* 11.7* 8.9 7.0  HGB 8.1* 7.9* 8.1* 8.4* 8.1*  HCT 26.9* 25.6* 26.7* 27.0* 26.8*  MCV 89.4 90.1 90.2 90.3 90.5  PLT 226 214 209 190 423   Basic Metabolic Panel: Recent Labs  Lab 09/20/19 0429 09/21/19 0337 09/22/19 0424 09/23/19 0416 09/24/19 0546  NA 136 140 140 143 144  K 3.8 3.9 3.8 3.7 3.8  CL 102 105 109 109 112*  CO2 22 24 23 23 22   GLUCOSE 202* 152* 143* 106* 134*  BUN 71* 68* 61* 50* 39*  CREATININE 3.70* 3.38* 3.02* 2.34* 2.15*  CALCIUM 7.6* 7.6* 7.3* 7.6* 7.5*  PHOS 3.7 3.4 2.9 3.2 2.9   GFR: Estimated Creatinine Clearance: 47.3 mL/min (A) (by C-G formula based on SCr of 2.15  mg/dL (H)). Liver Function Tests: Recent Labs  Lab 09/20/19 0429 09/21/19 0337 09/22/19 0424 09/23/19 0416 09/24/19 0546  ALBUMIN 1.7* 1.7* 1.8* 1.9* 1.9*   No results for input(s): LIPASE, AMYLASE in the last 168 hours. No results for input(s): AMMONIA in the last 168 hours. Coagulation Profile: No results for input(s): INR, PROTIME in the last 168 hours. Cardiac Enzymes: No results for input(s): CKTOTAL, CKMB, CKMBINDEX, TROPONINI in the last 168 hours. BNP (last 3 results) No results for input(s): PROBNP in the last 8760 hours. HbA1C: No results for input(s): HGBA1C in the last 72 hours. CBG: Recent Labs  Lab 09/23/19 1647 09/23/19 2257 09/24/19 0711 09/24/19 1130 09/24/19 1621  GLUCAP 156* 135* 115* 207* 145*   Lipid Profile: No results for input(s): CHOL, HDL, LDLCALC, TRIG, CHOLHDL, LDLDIRECT in the last 72 hours. Thyroid Function Tests: No results for input(s): TSH, T4TOTAL, FREET4, T3FREE, THYROIDAB in the last 72 hours. Anemia Panel: No results for input(s): VITAMINB12, FOLATE, FERRITIN, TIBC, IRON, RETICCTPCT in the last 72 hours. Sepsis Labs: No results for input(s): PROCALCITON, LATICACIDVEN in the last 168 hours.  No results  found for this or any previous visit (from the past 240 hour(s)).       Radiology Studies: No results found.      Scheduled Meds: . aspirin EC  81 mg Oral Daily  . atorvastatin  80 mg Oral QPM  . Chlorhexidine Gluconate Cloth  6 each Topical Daily  . clopidogrel  75 mg Oral q morning - 10a  . darbepoetin (ARANESP) injection - NON-DIALYSIS  60 mcg Subcutaneous Q Mon-1800  . doxycycline  100 mg Oral Q12H  . heparin injection (subcutaneous)  5,000 Units Subcutaneous Q8H  . insulin aspart  0-9 Units Subcutaneous TID WC  . levofloxacin  500 mg Oral Q48H  . levothyroxine  125 mcg Oral Q0600  . multivitamin  1 tablet Oral QHS  . nystatin   Topical TID  . Ensure Max Protein  11 oz Oral Daily  . saccharomyces boulardii  250  mg Oral BID  . sodium chloride flush  10-40 mL Intracatheter Q12H   Continuous Infusions: . sodium chloride       LOS: 16 days    Time spent: 40 minutes.     Harold Hedge, DO [facility] Pager [phone]  If 7PM-7AM, please contact night-coverage [email or web address] Password [email or web address] 09/24/2019, 7:13 PM

## 2019-09-24 NOTE — Progress Notes (Signed)
Occupational Therapy Treatment Patient Details Name: Maureen Jackson MRN: [id number] DOB: May 02, 1956 Today's Date: 09/24/2019    History of present illness Pt is a 63 y.o. female admitted 09/08/19 with weakness, difficulty voiding and poor appetite. Had recent I&D of left labia upper thigh abscess in ED on 9/26. Pt with worsening renal function. CT 10/5 with concern for Fournier gangrene. S/p RIJ non-tunneled cath placement. S/p I&D of labia abscess 10/6. PMH includes morbid obesity, HF, HTN, CAD, OSA.   OT comments  Patient progressing slowly.  Attempted sit to stand at sink with max assist +1, 3 trials, but unable to achieve full stand.  Able to complete grooming seated at sink with supervision/setup assist.  Patient reports completing majority of ADL/IADL tasks seated prior to admission. Pt internally distracted during session, focused on dc plan and is declining SNF placement if CIR is not an option although she is aware she needs increase assist. DC plan updated to CIR, pt will benefit from intensive CIR level in order to maximize independence and return to PLOF.     Follow Up Recommendations  CIR;SNF(post acute rehab)    Equipment Recommendations  3 in 1 bedside commode    Recommendations for Other Services Rehab consult    Precautions / Restrictions Precautions Precautions: Fall Precaution Comments: R IJ HD catheter, abscess L thigh & labia Restrictions Weight Bearing Restrictions: No       Mobility Bed Mobility               General bed mobility comments: OOB in recliner upon entry   Transfers Overall transfer level: Needs assistance     Sit to Stand: Max assist         General transfer comment: attempted 3 trials of sit to stand at sink, cueing to sequencing and for hand placement; able to achieve 75% stand with max assist but unable to achieve full stand with only 1+ assist    Balance Overall balance assessment: Needs assistance Sitting-balance support: No  upper extremity supported;Feet supported Sitting balance-Leahy Scale: Fair Sitting balance - Comments: patient able to sit unsupported in recliner without assist                                    ADL either performed or assessed with clinical judgement   ADL Overall ADL's : Needs assistance/impaired     Grooming: Set up;Supervision/safety;Sitting Grooming Details (indicate cue type and reason): attempted in standing but unable to reach full stand, completed seated at sink with setup assist                              Functional mobility during ADLs: Maximal assistance(attempted standing at sink) General ADL Comments: pt limited by pain, weakness and decreased activity tolerance     Vision       Perception     Praxis      Cognition Arousal/Alertness: Awake/alert Behavior During Therapy: Flat affect Overall Cognitive Status: Impaired/Different from baseline Area of Impairment: Following commands;Safety/judgement;Awareness;Problem solving                       Following Commands: Follows one step commands consistently;Follows one step commands with increased time;Follows multi-step commands inconsistently Safety/Judgement: Decreased awareness of deficits Awareness: Emergent Problem Solving: Decreased initiation;Requires verbal cues;Difficulty sequencing;Slow processing General Comments: pt with flat affect, difficulty sequencing and  following multiple step commands, but internally distracted by rehab discussion between case manager and her daughter during session         Exercises     Shoulder Instructions       General Comments daughter present and aware pt in need of post acute rehab     Pertinent Vitals/ Pain       Pain Assessment: Faces Faces Pain Scale: Hurts little more Pain Location: wound packing  Pain Descriptors / Indicators: Discomfort Pain Intervention(s): Monitored during session;Repositioned  Home Living                                           Prior Functioning/Environment              Frequency  Min 2X/week        Progress Toward Goals  OT Goals(current goals can now be found in the care plan section)  Progress towards OT goals: Progressing toward goals  Acute Rehab OT Goals Patient Stated Goal: to go home, not itch ADL Goals Pt Will Perform Grooming: with modified independence;sitting Pt Will Perform Upper Body Bathing: with modified independence;sitting Pt Will Perform Lower Body Bathing: with min assist;sit to/from stand Pt Will Perform Upper Body Dressing: with supervision;with set-up;sitting Pt Will Perform Lower Body Dressing: with min assist;sit to/from stand;with adaptive equipment Pt Will Transfer to Toilet: with min assist;bedside commode;stand pivot transfer  Plan Discharge plan needs to be updated;Frequency remains appropriate    Co-evaluation                 AM-PAC OT "6 Clicks" Daily Activity     Outcome Measure   Help from another person eating meals?: None Help from another person taking care of personal grooming?: None(seated) Help from another person toileting, which includes using toliet, bedpan, or urinal?: Total Help from another person bathing (including washing, rinsing, drying)?: A Lot Help from another person to put on and taking off regular upper body clothing?: A Little Help from another person to put on and taking off regular lower body clothing?: Total 6 Click Score: 15    End of Session Equipment Utilized During Treatment: Gait belt  OT Visit Diagnosis: Unsteadiness on feet (R26.81);Muscle weakness (generalized) (M62.81);Pain Pain - part of body: (groin)   Activity Tolerance Patient tolerated treatment well   Patient Left in chair;with call bell/phone within reach;with family/visitor present   Nurse Communication Mobility status        Time: 3818-2993 OT Time Calculation (min): 19 min  Charges: OT  General Charges $OT Visit: 1 Visit OT Treatments $Self Care/Home Management : 8-22 mins  Delight Stare, [place] Pager [phone] Office [phone]    Delight Stare 09/24/2019, 2:28 PM

## 2019-09-25 LAB — CBC
HCT: 27.7 % — ABNORMAL LOW (ref 36.0–46.0)
Hemoglobin: 8.3 g/dL — ABNORMAL LOW (ref 12.0–15.0)
MCH: 27.6 pg (ref 26.0–34.0)
MCHC: 30 g/dL (ref 30.0–36.0)
MCV: 92 fL (ref 80.0–100.0)
Platelets: 207 10*3/uL (ref 150–400)
RBC: 3.01 MIL/uL — ABNORMAL LOW (ref 3.87–5.11)
RDW: 15.9 % — ABNORMAL HIGH (ref 11.5–15.5)
WBC: 7.5 10*3/uL (ref 4.0–10.5)
nRBC: 0 % (ref 0.0–0.2)

## 2019-09-25 LAB — GLUCOSE, CAPILLARY
Glucose-Capillary: 165 mg/dL — ABNORMAL HIGH (ref 70–99)
Glucose-Capillary: 180 mg/dL — ABNORMAL HIGH (ref 70–99)
Glucose-Capillary: 174 mg/dL — ABNORMAL HIGH (ref 70–99)
Glucose-Capillary: 170 mg/dL — ABNORMAL HIGH (ref 70–99)

## 2019-09-25 LAB — RENAL FUNCTION PANEL
Albumin: 2 g/dL — ABNORMAL LOW (ref 3.5–5.0)
Anion gap: 11 (ref 5–15)
BUN: 36 mg/dL — ABNORMAL HIGH (ref 8–23)
CO2: 21 mmol/L — ABNORMAL LOW (ref 22–32)
Calcium: 7.4 mg/dL — ABNORMAL LOW (ref 8.9–10.3)
Chloride: 110 mmol/L (ref 98–111)
Creatinine, Ser: 2.04 mg/dL — ABNORMAL HIGH (ref 0.44–1.00)
GFR calc Af Amer: 30 mL/min — ABNORMAL LOW (ref >60)
GFR calc non Af Amer: 26 mL/min — ABNORMAL LOW (ref >60)
Glucose, Bld: 163 mg/dL — ABNORMAL HIGH (ref 70–99)
Phosphorus: 2.8 mg/dL (ref 2.5–4.6)
Potassium: 3.9 mmol/L (ref 3.5–5.1)
Sodium: 142 mmol/L (ref 135–145)

## 2019-09-25 LAB — SARS CORONAVIRUS 2 (TAT 6-24 HRS): SARS Coronavirus 2: NEGATIVE

## 2019-09-25 MED ORDER — FUROSEMIDE 40 MG PO TABS
40.0000 mg | ORAL_TABLET | Freq: Every day | ORAL | Status: DC
  Administered 2019-09-25: 40 mg via ORAL
  Filled 2019-09-25: qty 1

## 2019-09-25 MED ORDER — CARVEDILOL 25 MG PO TABS
25.0000 mg | ORAL_TABLET | Freq: Two times a day (BID) | ORAL | Status: DC
  Administered 2019-09-25 – 2019-09-29 (×10): 25 mg via ORAL
  Filled 2019-09-25 (×10): qty 1

## 2019-09-25 MED ORDER — FUROSEMIDE 10 MG/ML IJ SOLN: 40.0000 mg | Freq: Once | INTRAMUSCULAR | Status: DC

## 2019-09-25 MED ORDER — SPIRONOLACTONE 25 MG PO TABS
25.0000 mg | ORAL_TABLET | Freq: Every day | ORAL | Status: DC
  Administered 2019-09-25 – 2019-09-28 (×4): 25 mg via ORAL
  Filled 2019-09-25 (×5): qty 1

## 2019-09-25 MED ORDER — FUROSEMIDE 10 MG/ML IJ SOLN
20.0000 mg | Freq: Once | INTRAMUSCULAR | Status: AC
  Administered 2019-09-25: 20 mg via INTRAVENOUS
  Filled 2019-09-25: qty 2

## 2019-09-25 NOTE — Progress Notes (Signed)
Physical Therapy Treatment Patient Details Name: Maureen Jackson MRN: [id number] DOB: 11-Jul-1956 Today's Date: 09/25/2019    History of Present Illness Pt is a 63 y.o. female admitted 09/08/19 with weakness, difficulty voiding and poor appetite. Had recent I&D of left labia upper thigh abscess in ED on 9/26. Pt with worsening renal function. CT 10/5 with concern for Fournier gangrene. S/p RIJ non-tunneled cath placement. S/p I&D of labia abscess 10/6. PMH includes morbid obesity, HF, HTN, CAD, OSA.    PT Comments    Pt progressing with ambulation, but continues to fatigue easily and needs rest breaks.  Requires assist of 2 for safety and chair follow.  Needs skilled therapy for cueing and assessing activity tolerance.    Follow Up Recommendations  CIR     Equipment Recommendations  Wheelchair (measurements PT);Wheelchair cushion (measurements PT);Rolling walker with 5" wheels    Recommendations for Other Services       Precautions / Restrictions      Mobility  Bed Mobility               General bed mobility comments: OOB in recliner upon entry   Transfers Overall transfer level: Needs assistance Equipment used: Rolling walker (2 wheeled) Transfers: Sit to/from Stand Sit to Stand: Mod assist;+2 safety/equipment         General transfer comment: cued to lean forward and push up with hands from chair  Ambulation/Gait Ambulation/Gait assistance: Min assist;+2 physical assistance Gait Distance (Feet): 60 Feet(20'x3, 2 rest breaks ~3 inins each) Assistive device: Rolling walker (2 wheeled) Gait Pattern/deviations: Antalgic;Shuffle     General Gait Details: chair follow for safety and rest breaks; cues for RW and to reach back when sitting; did have some [facility]   Stairs             Wheelchair Mobility    Modified Rankin (Stroke Patients Only)       Balance     Sitting balance-Leahy Scale: Fair Sitting balance - Comments: patient able to sit  unsupported in recliner without assist    Standing balance support: Bilateral upper extremity supported Standing balance-Leahy Scale: Poor Standing balance comment: UE support and CGA for standing balance                            Cognition Arousal/Alertness: Awake/alert Behavior During Therapy: WFL for tasks assessed/performed Overall Cognitive Status: Within Functional Limits for tasks assessed                         Following Commands: Follows multi-step commands consistently Safety/Judgement: Decreased awareness of safety            Exercises      General Comments        Pertinent Vitals/Pain Pain Assessment: No/denies pain Pain Score: (reports pain with wound packing but no pain at this time)    Home Living                      Prior Function            PT Goals (current goals can now be found in the care plan section) Acute Rehab PT Goals Potential to Achieve Goals: Good Progress towards PT goals: Progressing toward goals    Frequency    Min 3X/week      PT Plan Current plan remains appropriate    Co-evaluation  AM-PAC PT "6 Clicks" Mobility   Outcome Measure                   End of Session Equipment Utilized During Treatment: Gait belt Activity Tolerance: Patient limited by fatigue Patient left: in chair;with call bell/phone within reach Nurse Communication: Mobility status PT Visit Diagnosis: Other abnormalities of gait and mobility (R26.89);Muscle weakness (generalized) (M62.81)     Time: 1000-1020 PT Time Calculation (min) (ACUTE ONLY): 20 min  Charges:  $Gait Training: 8-22 mins                     Karlton Lemon, PT, DPT Acute Rehab Services [phone]   Karlton Lemon 09/25/2019, 10:46 AM

## 2019-09-25 NOTE — Progress Notes (Signed)
PROGRESS NOTE    Maureen Jackson  EGB:[id number] DOB: [birth year] DOA: 09/08/2019 PCP: Neale Burly, MD   Brief Narrative: 63 year old with past medical history significant for morbid obesity, systolic heart failure, hypothyroidism, hypertension, CAD and obstructive sleep apnea.  Presents with  generalized weakness, difficulty voiding, poor appetite and insomnia for 3 days.  September 26,  2 days prior to hospitalization she was seen in the emergency department for left labia upper thigh abscess which was drained.  Discharge home on oral antibiotics.  Patient had worsening renal function in the setting of concern for Fournier gangrene, patient was transferred from [facility]  to [facility] for further management.    Assessment & Plan:   Principal Problem:   Fournier's gangrene in female Active Problems:   Hypothyroidism   Essential hypertension   Morbid obesity/BMI > 55   Diabetes mellitus type 2 with complications, uncontrolled (HCC)   CAD, multiple vessel   Ischemic cardiomyopathy   Chronic combined systolic and diastolic CHF (congestive heart failure) (EF 30 to 35 %)   CAP (community acquired pneumonia)   Labial abscess   Abscess   Fistula   AMS (altered mental status)   Drug rash   Acute renal failure (ARF) (HCC)   Pressure injury of skin  1-Labial abscess/Fournier gangrene complicated with sepsis/acute metabolic encephalopathy Patient presented with leukocytosis, fever post sediment in Foley.  Repeat CT scan done on 10/5 findings worrisome for Fournier gangrene: Patient was taken to the OR by general surgery on 10/6. -ID was consulted and had been helping with antibiotics, currently signed off, antibiotics has been changed from Zyvox, Unasyn  to Levaquin, and Doxycycline in light of developing rash -Surgery recommending wet-to-dry dressing twice a day -Per surgery no need to go to OR> per sx continue with Wound care.  -Continue with levaquin and doxy for 10 days from  10-06/--last day 10-16 -Continue with wound care.   2-Rash;Stable Benadryl cream and oral benadryl PRN.  Unasyn and Zyvox discontinued.,  Antibiotics as above.  Plan to stop CHG baths.  3-AKI; improving, likely volume overload non-anion gap metabolic acidosis.  Suspected acute tubular necrosis, oliguric hypocalcemia hyperphosphatemia: Nephrology was consulted, patient was a started on hemodialysis on 10/6 Had HD on 10-08.  Nephro signed off 10/13 Continues to improve with diuresis. -Additional Lasix 20 mg IV this evening -Continue holding home Entresto until renal function improves  4-Acute on chronic systolic heart failure exacerbation, ejection fraction 30 to 35%: Ischemic cardiomyopathy status post DES to LAD and RCA 2018.  Following with dialysis however has significantly increased over the past 48 hours.  58 kg (10/13)-> 186 kg (today) Continue aspirin/ plavix.  Restarted home carvedilol and Lasix today, still volume overloaded but improving -Additional Lasix 20 mg IV this evening -May need to reach back out to nephro today for ultrafiltration -Restart home spironolactone -Continue holding Entresto  5-Diabetes: Continue with sliding scale insulin  6-Morbid obesity with OSA: Continue with CPAP Body mass index 60  Hypothyroidism Continue with Synthroid.  Acute metabolic encephalopathy: Resolved.  Related to acute illness renal failure sepsis infection.     Nutrition Problem: Inadequate oral intake Etiology: acute illness(pneumonia; abscess in left labia/upper thigh area)    Signs/Symptoms: meal completion < 25%    Interventions: MVI, Refer to RD note for recommendations  Estimated body mass index is 68.53 kg/m as calculated from the following:   Height as of this encounter: 5\' 5"  (1.651 m).   Weight as of this encounter: 186.8  kg.   DVT prophylaxis: Heparin Code Status: Full code Disposition Plan: Discuss SNF at discharge with patient.  Can likely be discharged  in 1 to 2 days. Consultants:   surgery  ID  Nephrology  Procedures:   Irrigation and debridement months abscess 6 x 6 by day 10/6  Had HD on 10/6 and 10-08  Antimicrobials:  Levaquin and doxy Received Zyvox, Unasyn  Subjective: Patient seen and examined at bedside no acute distress and resting comfortably.  No events overnight.  Tolerating diet.  Denies any chest pain, shortness of breath, fever, nausea, vomiting, urinary complaints.  Admits to having bowel movement.  Otherwise ROS negative    Objective: Vitals:   09/24/19 1951 09/25/19 0045 09/25/19 0417 09/25/19 0529  BP: (!) 166/81 (!) 190/92 (!) 164/82   Pulse: 81 74 77   Resp: 18 18 18    Temp: 98.9 F (37.2 C) 98.7 F (37.1 C) 98.5 F (36.9 C)   TempSrc: Oral Oral Oral   SpO2: 98% 98% 98%   Weight:    (!) 186.8 kg  Height:        Intake/Output Summary (Last 24 hours) at 09/25/2019 0746 Last data filed at 09/25/2019 0600 Gross per 24 hour  Intake 837 ml  Output 301 ml  Net 536 ml   Filed Weights   09/23/19 0621 09/24/19 0556 09/25/19 0529  Weight: (!) 158.7 kg (!) 186.9 kg (!) 186.8 kg    Examination: Physical Exam Vitals signs and nursing note reviewed.  Constitutional:      Appearance: She is obese.  HENT:     Head: Normocephalic and atraumatic.     Nose: Nose normal.     Mouth/Throat:     Mouth: Mucous membranes are moist.  Eyes:     Extraocular Movements: Extraocular movements intact.  Neck:     Musculoskeletal: Normal range of motion. No neck rigidity.  Cardiovascular:     Rate and Rhythm: Normal rate and regular rhythm.  Pulmonary:     Effort: Pulmonary effort is normal. No respiratory distress.     Breath sounds: Normal breath sounds.  Abdominal:     General: Abdomen is flat.     Palpations: Abdomen is soft.  Musculoskeletal: Normal range of motion.        General: No swelling.     Comments: Trace bilateral lower extremity pitting edema  Neurological:     General: No focal  deficit present.     Mental Status: She is alert. Mental status is at baseline.        Data Reviewed: I have personally reviewed following labs and imaging studies  CBC: Recent Labs  Lab 09/19/19 1125 09/20/19 0429 09/21/19 0337 09/23/19 0416 09/24/19 0546  WBC 17.2* 16.3* 11.7* 8.9 7.0  HGB 8.1* 7.9* 8.1* 8.4* 8.1*  HCT 26.9* 25.6* 26.7* 27.0* 26.8*  MCV 89.4 90.1 90.2 90.3 90.5  PLT 226 214 209 190 144   Basic Metabolic Panel: Recent Labs  Lab 09/20/19 0429 09/21/19 0337 09/22/19 0424 09/23/19 0416 09/24/19 0546  NA 136 140 140 143 144  K 3.8 3.9 3.8 3.7 3.8  CL 102 105 109 109 112*  CO2 22 24 23 23 22   GLUCOSE 202* 152* 143* 106* 134*  BUN 71* 68* 61* 50* 39*  CREATININE 3.70* 3.38* 3.02* 2.34* 2.15*  CALCIUM 7.6* 7.6* 7.3* 7.6* 7.5*  PHOS 3.7 3.4 2.9 3.2 2.9   GFR: Estimated Creatinine Clearance: 47.2 mL/min (A) (by C-G formula based on SCr of 2.15  mg/dL (H)). Liver Function Tests: Recent Labs  Lab 09/20/19 0429 09/21/19 0337 09/22/19 0424 09/23/19 0416 09/24/19 0546  ALBUMIN 1.7* 1.7* 1.8* 1.9* 1.9*   No results for input(s): LIPASE, AMYLASE in the last 168 hours. No results for input(s): AMMONIA in the last 168 hours. Coagulation Profile: No results for input(s): INR, PROTIME in the last 168 hours. Cardiac Enzymes: No results for input(s): CKTOTAL, CKMB, CKMBINDEX, TROPONINI in the last 168 hours. BNP (last 3 results) No results for input(s): PROBNP in the last 8760 hours. HbA1C: No results for input(s): HGBA1C in the last 72 hours. CBG: Recent Labs  Lab 09/24/19 0711 09/24/19 1130 09/24/19 1621 09/24/19 2120 09/25/19 0507  GLUCAP 115* 207* 145* 149* 165*   Lipid Profile: No results for input(s): CHOL, HDL, LDLCALC, TRIG, CHOLHDL, LDLDIRECT in the last 72 hours. Thyroid Function Tests: No results for input(s): TSH, T4TOTAL, FREET4, T3FREE, THYROIDAB in the last 72 hours. Anemia Panel: No results for input(s): VITAMINB12, FOLATE,  FERRITIN, TIBC, IRON, RETICCTPCT in the last 72 hours. Sepsis Labs: No results for input(s): PROCALCITON, LATICACIDVEN in the last 168 hours.  No results found for this or any previous visit (from the past 240 hour(s)).       Radiology Studies: No results found.      Scheduled Meds: . aspirin EC  81 mg Oral Daily  . atorvastatin  80 mg Oral QPM  . Chlorhexidine Gluconate Cloth  6 each Topical Daily  . clopidogrel  75 mg Oral q morning - 10a  . darbepoetin (ARANESP) injection - NON-DIALYSIS  60 mcg Subcutaneous Q Mon-1800  . doxycycline  100 mg Oral Q12H  . heparin injection (subcutaneous)  5,000 Units Subcutaneous Q8H  . insulin aspart  0-9 Units Subcutaneous TID WC  . levofloxacin  500 mg Oral Q48H  . levothyroxine  125 mcg Oral Q0600  . multivitamin  1 tablet Oral QHS  . nystatin   Topical TID  . Ensure Max Protein  11 oz Oral Daily  . saccharomyces boulardii  250 mg Oral BID  . sodium chloride flush  10-40 mL Intracatheter Q12H   Continuous Infusions: . sodium chloride       LOS: 17 days    Time spent: 25 minutes.     Harold Hedge, DO [facility] Pager [phone]  If 7PM-7AM, please contact night-coverage [email or web address] Password [facility] 09/25/2019, 7:46 AM

## 2019-09-25 NOTE — Progress Notes (Signed)
Inpatient Rehabilitation-Admissions Coordinator   Spoke with pt and her daughter as follow up from previous conversation regarding CIR. Pt's daughter present for dressing change yesterday and states that she is unable to provide that type of assistance at DC. The pt and her daughter are open to SNF at this time to allow for wound healing prior to DC home.   I have notified TOC team regarding pt preference.   AC will sign off.   Please call if questions.   Jhonnie Garner, OTR/L  Rehab Admissions Coordinator  [phone] 09/25/2019 10:24 AM

## 2019-09-25 NOTE — TOC Progression Note (Addendum)
Transition of Care [facility]) - Progression Note    Patient Details  Name: Maureen Jackson MRN: [id number] Date of Birth: 14-Aug-1956  Transition of [facility] CM/SW Contact  Zenon Mayo, RN Phone Number: 09/25/2019, 5:08 PM  Clinical Narrative:    From home with daughter, NCM spoke with patient and she is agreeable to SNF work up, she gives this NCM permission to fax her information out to the SNF's in [place].  10/9 Tomi Bamberger RN, BSN - patient had bed offer from [facility], she states she does not want to go to [place] is not accepting patients because they have covid patients. MD notified  10/13 Tomi Bamberger RN, BSN- NCM spoke with patient , she is interested in CIR, NCM informed MD, order in for CIR consult.  NCM asked patient what is her other option if this is not approved by her insurance, she states she would like to go home, she does not want to go to SNF.  She states she had [facility] in the past.   10/14 Tomi Bamberger RN,  BSN- CIR consult, daughter to make sure patient has wound care if she has a CIR stay per Claiborne Billings with CIR. Daughter is [name] [phone].   10/15 Tomi Bamberger RN, BSN- Claiborne Billings with CIR states they rec SNF for patient with wound care. NCM spoke with patient and daughter.  Daughter states her mother needs to go to SNF because she can not do the wound care, patient does not have any one at home to do wound care that needs packing.  NCM did send out to other snfs, did get an offer from [facility], but patient still states she does not want to go there.  NCM just received offer from [facility] for patient, patient states she will let NCM know in the morning.   10/16 Tomi Bamberger RN, BSN- NCM spoke with patient and her sister , [name] at the bedside.  She states they would like to go to [facility] on [address] informed Bryson Ha , she states they will not have an opening til maybe Wed.  [name] states to start auth  for that one , she does not want to go to the one on [place].  Alsion with [facility] informed.    Expected Discharge Plan: Skilled Nursing Facility Barriers to Discharge: No Barriers Identified  Expected Discharge Plan and Services Expected Discharge Plan: [place] In-house Referral: NA Discharge Planning Services: CM Consult Post Acute Care Choice: [place] Living arrangements for the past 2 months: Apartment                 DME Arranged: (NA)         HH Arranged: NA           Social Determinants of Health (SDOH) Interventions    Readmission Risk Interventions Readmission Risk Prevention Plan 09/18/2019  Transportation Screening Complete  HRI or [place] Complete  Social Work Consult for [place] Planning/Counseling Complete  Palliative Care Screening Not Applicable  Medication Review Press photographer) Complete  Some recent data might be hidden

## 2019-09-26 LAB — GLUCOSE, CAPILLARY
Glucose-Capillary: 341 mg/dL — ABNORMAL HIGH (ref 70–99)
Glucose-Capillary: 198 mg/dL — ABNORMAL HIGH (ref 70–99)
Glucose-Capillary: 268 mg/dL — ABNORMAL HIGH (ref 70–99)
Glucose-Capillary: 260 mg/dL — ABNORMAL HIGH (ref 70–99)

## 2019-09-26 LAB — RENAL FUNCTION PANEL
Albumin: 2.1 g/dL — ABNORMAL LOW (ref 3.5–5.0)
Anion gap: 11 (ref 5–15)
BUN: 40 mg/dL — ABNORMAL HIGH (ref 8–23)
CO2: 20 mmol/L — ABNORMAL LOW (ref 22–32)
Calcium: 7.4 mg/dL — ABNORMAL LOW (ref 8.9–10.3)
Chloride: 109 mmol/L (ref 98–111)
Creatinine, Ser: 2.55 mg/dL — ABNORMAL HIGH (ref 0.44–1.00)
GFR calc Af Amer: 23 mL/min — ABNORMAL LOW (ref >60)
GFR calc non Af Amer: 20 mL/min — ABNORMAL LOW (ref >60)
Glucose, Bld: 217 mg/dL — ABNORMAL HIGH (ref 70–99)
Phosphorus: 3.4 mg/dL (ref 2.5–4.6)
Potassium: 4.1 mmol/L (ref 3.5–5.1)
Sodium: 140 mmol/L (ref 135–145)

## 2019-09-26 LAB — CBC
HCT: 26.7 % — ABNORMAL LOW (ref 36.0–46.0)
Hemoglobin: 8.3 g/dL — ABNORMAL LOW (ref 12.0–15.0)
MCH: 28.5 pg (ref 26.0–34.0)
MCHC: 31.1 g/dL (ref 30.0–36.0)
MCV: 91.8 fL (ref 80.0–100.0)
Platelets: 186 10*3/uL (ref 150–400)
RBC: 2.91 MIL/uL — ABNORMAL LOW (ref 3.87–5.11)
RDW: 16 % — ABNORMAL HIGH (ref 11.5–15.5)
WBC: 7.2 10*3/uL (ref 4.0–10.5)
nRBC: 0 % (ref 0.0–0.2)

## 2019-09-26 MED ORDER — CETAPHIL MOISTURIZING EX LOTN
TOPICAL_LOTION | CUTANEOUS | Status: DC | PRN
  Filled 2019-09-26: qty 473

## 2019-09-26 MED ORDER — TRAMADOL HCL 50 MG PO TABS
50.0000 mg | ORAL_TABLET | Freq: Four times a day (QID) | ORAL | Status: DC | PRN
  Administered 2019-09-26 – 2019-10-02 (×12): 50 mg via ORAL
  Filled 2019-09-26 (×13): qty 1

## 2019-09-26 MED ORDER — HYDROCERIN EX CREA
TOPICAL_CREAM | CUTANEOUS | Status: DC | PRN
  Administered 2019-09-27 – 2019-10-01 (×3): via TOPICAL
  Filled 2019-09-26: qty 113

## 2019-09-26 NOTE — Progress Notes (Signed)
Nutrition Follow-up  DOCUMENTATION CODES:   Morbid obesity  INTERVENTION:   -Continue renal MVI daily -D/c Ensure Max -Continue double protein portions at meals -Magic cup TID with meals, each supplement provides 290 kcal and 9 grams of protein  NUTRITION DIAGNOSIS:   Inadequate oral intake related to acute illness(pneumonia; abscess in left labia/upper thigh area) as evidenced by meal completion < 25%.  Ongoing  GOAL:   Patient will meet greater than or equal to 90% of their needs  Progressing   MONITOR:   PO intake, Labs, I & O's, Weight trends, Supplement acceptance, Skin  REASON FOR ASSESSMENT:   Consult Assessment of nutrition requirement/status  ASSESSMENT:   63 year old female with medical history significant for morbid obesity, CHF, hypothyroidism, HTN, OSA, CAD, T2DM, and arthritis who presented to ED with complaints of generalized weakness, severe dry cough, difficulty voiding, poor appetite, and difficulty sleeping for the past 3 days. Patient seen in ED 9/26 for abscess in left labia/upper thigh area that has been draining large amounts of pus; patient discharged on doxycyline and pain medication. Portable CXR showed multifocal pneumonia underlying pulmonary vascular congestion  10/1- transferred from [facility] to [facility] for surgery evaluation 10/2- surgery cancelled 2/2 plavix 10/6- s/p tunnelled catheter placement and HD initiation ; s/pIRRIGATION AND DEBRIDEMENTMONS ABSCESS (6 CM X 6 CM X 8 CM) 10/13- HD cath d/c  Reviewed I/O's: +89 ml x 24 hours and -3.1 L since 09/12/19  UOP: 150 ml x 24 hours   Case discussed with RN. She reports concern that pt is refusing Ensure Max supplements and healing of wounds. She requested RD re-evaluated supplement regimen to maximize protein intake.   Intake has improved since last visit. Noted meal completion 50-100%. Pt with minimal acceptance of various supplements this admission. RD will try Magic Cup.   Per RN, pt will  likely d/c to SNF early next week.   Labs reviewed: CBGS: [id number] (inpatient orders for glycemic control are 0-9 units insulin aspart TID with meals).   Diet Order:   Diet Order            Diet renal/carb modified with fluid restriction Diet-HS Snack? Nothing; Fluid restriction: 1200 mL Fluid; Room service appropriate? Yes; Fluid consistency: Thin  Diet effective now              EDUCATION NEEDS:   Not appropriate for education at this time  Skin:  Skin Assessment: Skin Integrity Issues: Skin Integrity Issues:: Stage II Stage II: lt buttocks Other: lt labial abscess concerning for Fournier's gangrene  Last BM:  09/24/19  Height:   Ht Readings from Last 1 Encounters:  09/08/19 5\' 5"  (1.651 m)    Weight:   Wt Readings from Last 1 Encounters:  09/25/19 (!) 186.8 kg    Ideal Body Weight:  56.8 kg  BMI:  Body mass index is 68.53 kg/m.  Estimated Nutritional Needs:   Kcal:  1800-2000  Protein:  120-140 grams  Fluid:  1.2 L    Olin Gurski A. Jimmye Norman, RD, LDN, [place] Registered Dietitian II Certified Diabetes Care and Education Specialist Pager: [phone] After hours Pager: [phone]

## 2019-09-26 NOTE — Plan of Care (Signed)
  Problem: Education: Goal: Knowledge of General Education information will improve Description Including pain rating scale, medication(s)/side effects and non-pharmacologic comfort measures Outcome: Progressing   

## 2019-09-26 NOTE — Progress Notes (Signed)
[facility] Progress Note  10 Days Post-Op  Subjective: CC: pain at wound Patient complains of pain at wound site with dressing changes. She is not getting any pain medication other than tylenol. Patient declines for me to repeat dressing change from this AM but allowed me to look at exterior wound and her sister had a picture of the wound this AM.   Objective: Vital signs in last 24 hours: Temp:  [97.6 F (36.4 C)-98.4 F (36.9 C)] 98.4 F (36.9 C) (10/16 0829) Pulse Rate:  [69-79] 79 (10/16 0829) Resp:  [18-19] 18 (10/16 0829) BP: (124-143)/(64-80) 124/64 (10/16 0829) SpO2:  [97 %-99 %] 99 % (10/16 0829) Last BM Date: 09/24/19  Intake/Output from previous day: 10/15 0701 - 10/16 0700 In: 240 [P.O.:240] Out: 151 [Urine:150; Stool:1] Intake/Output this shift: Total I/O In: 120 [P.O.:120] Out: 160 [Urine:160]  PE: Gen:  Alert, NAD, pleasant GU: wound clean with some fibrinous fat necrosis but overall more granulation tissue, some induration still present to superior mons  Lab Results:  Recent Labs    09/25/19 0702 09/26/19 0424  WBC 7.5 7.2  HGB 8.3* 8.3*  HCT 27.7* 26.7*  PLT 207 186   BMET Recent Labs    09/25/19 0702 09/26/19 0424  NA 142 140  K 3.9 4.1  CL 110 109  CO2 21* 20*  GLUCOSE 163* 217*  BUN 36* 40*  CREATININE 2.04* 2.55*  CALCIUM 7.4* 7.4*   PT/INR No results for input(s): LABPROT, INR in the last 72 hours. CMP     Component Value Date/Time   NA 140 09/26/2019 0424   K 4.1 09/26/2019 0424   CL 109 09/26/2019 0424   CO2 20 (L) 09/26/2019 0424   GLUCOSE 217 (H) 09/26/2019 0424   BUN 40 (H) 09/26/2019 0424   CREATININE 2.55 (H) 09/26/2019 0424   CALCIUM 7.4 (L) 09/26/2019 0424   PROT 6.2 (L) 09/11/2019 0813   ALBUMIN 2.1 (L) 09/26/2019 0424   AST 73 (H) 09/11/2019 0813   ALT 42 09/11/2019 0813   ALKPHOS 166 (H) 09/11/2019 0813   BILITOT 1.0 09/11/2019 0813   GFRNONAA 20 (L) 09/26/2019 0424   GFRAA 23 (L) 09/26/2019  0424   Lipase  No results found for: LIPASE     Studies/Results: No results found.  Anti-infectives: Anti-infectives (From admission, onward)   Start     Dose/Rate Route Frequency Ordered Stop   09/19/19 1600  levofloxacin (LEVAQUIN) tablet 500 mg     500 mg Oral Every 48 hours 09/18/19 1118 09/25/19 1814   09/17/19 1600  levofloxacin (LEVAQUIN) tablet 750 mg  Status:  Discontinued     750 mg Oral Every 48 hours 09/17/19 1100 09/18/19 1118   09/17/19 1600  doxycycline (VIBRA-TABS) tablet 100 mg     100 mg Oral Every 12 hours 09/17/19 1100 09/26/19 2359   09/16/19 1600  linezolid (ZYVOX) IVPB 600 mg  Status:  Discontinued     600 mg 300 mL/hr over 60 Minutes Intravenous Every 12 hours 09/16/19 1556 09/17/19 1100   09/12/19 1100  linezolid (ZYVOX) IVPB 600 mg  Status:  Discontinued     600 mg 300 mL/hr over 60 Minutes Intravenous Every 12 hours 09/12/19 0945 09/16/19 1556   09/12/19 1100  Ampicillin-Sulbactam (UNASYN) 3 g in sodium chloride 0.9 % 100 mL IVPB  Status:  Discontinued     3 g 200 mL/hr over 30 Minutes Intravenous Every 12 hours 09/12/19 0945 09/17/19 1100   09/10/19 1430  clindamycin (CLEOCIN) IVPB 600 mg  Status:  Discontinued     600 mg 100 mL/hr over 30 Minutes Intravenous Every 6 hours 09/10/19 1410 09/12/19 0945   09/10/19 0742  vancomycin variable dose per unstable renal function (pharmacist dosing)  Status:  Discontinued      Does not apply See admin instructions 09/10/19 0742 09/10/19 1413   09/10/19 0000  vancomycin (VANCOCIN) 1,750 mg in sodium chloride 0.9 % 500 mL IVPB  Status:  Discontinued     1,750 mg 250 mL/hr over 120 Minutes Intravenous Every 24 hours 09/09/19 0912 09/09/19 0928   09/10/19 0000  vancomycin (VANCOCIN) 1,750 mg in sodium chloride 0.9 % 500 mL IVPB  Status:  Discontinued     1,750 mg 250 mL/hr over 120 Minutes Intravenous Every 24 hours 09/09/19 0940 09/10/19 0751   09/09/19 1030  vancomycin (VANCOCIN) IVPB 750 mg/150 ml premix   Status:  Discontinued     750 mg 150 mL/hr over 60 Minutes Intravenous  Once 09/09/19 0928 09/09/19 0938   09/09/19 0930  vancomycin (VANCOCIN) IVPB 1000 mg/200 mL premix  Status:  Discontinued     1,000 mg 200 mL/hr over 60 Minutes Intravenous  Once 09/09/19 0928 09/09/19 0938   09/08/19 2300  azithromycin (ZITHROMAX) 500 mg in sodium chloride 0.9 % 250 mL IVPB  Status:  Discontinued     500 mg 250 mL/hr over 60 Minutes Intravenous Every 24 hours 09/08/19 2230 09/12/19 0945   09/08/19 2300  cefTRIAXone (ROCEPHIN) 2 g in sodium chloride 0.9 % 100 mL IVPB  Status:  Discontinued     2 g 200 mL/hr over 30 Minutes Intravenous Every 24 hours 09/08/19 2230 09/12/19 0945   09/08/19 2300  vancomycin (VANCOCIN) 1,500 mg in sodium chloride 0.9 % 500 mL IVPB     1,500 mg 250 mL/hr over 120 Minutes Intravenous  Once 09/08/19 2254 09/09/19 0434   09/08/19 1600  vancomycin (VANCOCIN) IVPB 1000 mg/200 mL premix     1,000 mg 200 mL/hr over 60 Minutes Intravenous  Once 09/08/19 1558 09/08/19 1929   09/08/19 1600  piperacillin-tazobactam (ZOSYN) IVPB 3.375 g     3.375 g 100 mL/hr over 30 Minutes Intravenous  Once 09/08/19 1558 09/08/19 1655       Assessment/Plan Morbid obesity BMI 60.13 DM CAD with cardiac stents in place on plavix Hx ischemic cardiomyopathy Acute on chronic systolic heart failure EF 30-35% Anemia Multifactorial encephalopathy Hypothyroidism HLD AKI on CKD   POD 10, s/p incision and debridment of mons soft tissue infection and spontaneously draining left labial abscess, 10/6 Dr. Ninfa Linden Irrigation and debridement of mons abscess 6 x 6 x 8 cm 09/16/2019 DR. Coralie Keens - cont WTD dressing changes to this wound.  It is generally clean. - stable for discharge from a general surgery standpoint, can follow up in wound care center after SNF - I think it is appropriate to give tramadol prior to dressing changes to help with pain control   ID:  Vancomycin 9/28x 2  doses;Rocephin 9/28-10/2, azithromycin 9/28-10/2, clindamycin 9/30-10/2. Currently on Zyvox and Unasyn 10/2-10/7; Doxycycline 10/7>> VTE -SCDs, sq heparin,last does of plavix 10/02 FEN -IVF,CM Foley -in place Follow up -TBD.  LOS: 18 days    Brigid Re , [facility] 09/26/2019, 11:29 AM Please see Amion for pager number during day hours 7:00am-4:30pm

## 2019-09-26 NOTE — Progress Notes (Addendum)
PROGRESS NOTE    Maureen Jackson  QIW:[id number] DOB: August 22, 1956 DOA: 09/08/2019 PCP: Neale Burly, MD   Brief Narrative: 63 year old with past medical history significant for morbid obesity, systolic heart failure, hypothyroidism, hypertension, CAD and obstructive sleep apnea.  Presents with  generalized weakness, difficulty voiding, poor appetite and insomnia for 3 days.  September 26,  2 days prior to hospitalization she was seen in the emergency department for left labia upper thigh abscess which was drained.  Discharge home on oral antibiotics.  Patient had worsening renal function in the setting of concern for Fournier gangrene, patient was transferred from [facility] [place]  to [facility] for further management.    Assessment & Plan:   Principal Problem:   Fournier's gangrene in female Active Problems:   Hypothyroidism   Essential hypertension   Morbid obesity/BMI > 55   Diabetes mellitus type 2 with complications, uncontrolled (HCC)   CAD, multiple vessel   Ischemic cardiomyopathy   Chronic combined systolic and diastolic CHF (congestive heart failure) (EF 30 to 35 %)   CAP (community acquired pneumonia)   Labial abscess   Abscess   Fistula   AMS (altered mental status)   Drug rash   Acute renal failure (ARF) (HCC)   Pressure injury of skin  Labial abscess/Fournier gangrene complicated with sepsis/acute metabolic encephalopathy Patient presented with leukocytosis, fever post sediment in Foley.  Repeat CT scan done on 10/5 findings worrisome for Fournier gangrene: Patient was taken to the OR by general surgery on 10/6. -ID was consulted and had been helping with antibiotics, currently signed off, antibiotics has been changed from Zyvox, Unasyn  to Levaquin, and Doxycycline in light of developing rash -Surgery recommending wet-to-dry dressing twice a day -Per surgery no need to go to OR> per sx continue with Wound care.  -Continue with levaquin and doxy for 10 days from  10-06/--last day today -Continue with wound care.   Rash;Stable Benadryl cream and oral benadryl PRN.  Unasyn and Zyvox discontinued.,  Antibiotics as above.  Can use moisturizer cream to back prn  AKI; at presentation. Nephrology was consulted, patient was a started on hemodialysis on 10/6 and had HD on 10-08.  Nephro signed off 10/13 with improvement in urine output and volume status.  She was restarted on home Lasix reports not much urine output but is urinating (I/O are inaccurate), creatinine bumped today 2.04->2.55. -Hold home Lasix -Continue holding home Entresto until renal function improves -Discussed with nephrology  Acute on chronic systolic heart failure exacerbation, ejection fraction 30 to 35%: Ischemic cardiomyopathy status post DES to LAD and RCA 2018.  Following with dialysis however has significantly increased over the past 48 hours.  58 kg (10/13)-> 186 kg (unlikely accurate). received home spironolactone last night Continue aspirin/ plavix.  Restarted home carvedilol and Lasix today, still volume overloaded but improving -Additional Lasix 20 mg IV this evening -May need to reach back out to nephro today for ultrafiltration -Continue holding Entresto  Diabetes: Continue with sliding scale insulin  Morbid obesity with OSA: Continue with CPAP Body mass index 60  Hypothyroidism Continue with Synthroid.  Acute metabolic encephalopathy: Resolved.  Related to acute illness renal failure sepsis infection.     Nutrition Problem: Inadequate oral intake Etiology: acute illness(pneumonia; abscess in left labia/upper thigh area)    Signs/Symptoms: meal completion < 25%    Interventions: MVI, Refer to RD note for recommendations  Estimated body mass index is 68.53 kg/m as calculated from the following:   Height  as of this encounter: 5\' 5"  (1.651 m).   Weight as of this encounter: 186.8 kg.   DVT prophylaxis: Heparin Code Status: Full code Discussed plan last  night with daughter at bedside Disposition Plan: Patient is agreeable to being discharged to SNF Consultants:   surgery  ID  Nephrology  Procedures:   Irrigation and debridement months abscess 6 x 6 by day 10/6  Had HD on 10/6 and 10-08  Antimicrobials:  Levaquin and doxy Received Zyvox, Unasyn  Subjective: Seen and examined at bedside     Objective: Vitals:   09/25/19 1725 09/25/19 2002 09/26/19 0055 09/26/19 0450  BP: (!) 143/79 (!) 143/77 128/80 130/74  Pulse: 75 74 79 69  Resp: 19 18 18 18   Temp: 98.3 F (36.8 C) 98.1 F (36.7 C) 98.4 F (36.9 C) 97.6 F (36.4 C)  TempSrc: Oral Oral Oral Oral  SpO2: 99% 97% 97% 99%  Weight:      Height:        Intake/Output Summary (Last 24 hours) at 09/26/2019 0738 Last data filed at 09/26/2019 0600 Gross per 24 hour  Intake 240 ml  Output 151 ml  Net 89 ml   Filed Weights   09/23/19 0621 09/24/19 0556 09/25/19 0529  Weight: (!) 158.7 kg (!) 186.9 kg (!) 186.8 kg    Examination: Physical Exam Vitals signs and nursing note reviewed.  Constitutional:      Appearance: She is obese.  HENT:     Head: Normocephalic and atraumatic.     Nose: Nose normal.     Mouth/Throat:     Mouth: Mucous membranes are moist.  Eyes:     Extraocular Movements: Extraocular movements intact.  Neck:     Musculoskeletal: Normal range of motion. No neck rigidity.  Cardiovascular:     Rate and Rhythm: Normal rate and regular rhythm.  Pulmonary:     Effort: Pulmonary effort is normal. No respiratory distress.     Breath sounds: Normal breath sounds.  Abdominal:     General: Abdomen is flat.     Palpations: Abdomen is soft.  Musculoskeletal: Normal range of motion.        General: No swelling.     Comments: Trace bilateral lower extremity pitting edema  Skin:    Comments: Dry skin on back  Neurological:     General: No focal deficit present.     Mental Status: She is alert. Mental status is at baseline.        Data  Reviewed: I have personally reviewed following labs and imaging studies  CBC: Recent Labs  Lab 09/21/19 0337 09/23/19 0416 09/24/19 0546 09/25/19 0702 09/26/19 0424  WBC 11.7* 8.9 7.0 7.5 7.2  HGB 8.1* 8.4* 8.1* 8.3* 8.3*  HCT 26.7* 27.0* 26.8* 27.7* 26.7*  MCV 90.2 90.3 90.5 92.0 91.8  PLT 209 190 198 207 951   Basic Metabolic Panel: Recent Labs  Lab 09/22/19 0424 09/23/19 0416 09/24/19 0546 09/25/19 0702 09/26/19 0424  NA 140 143 144 142 140  K 3.8 3.7 3.8 3.9 4.1  CL 109 109 112* 110 109  CO2 23 23 22  21* 20*  GLUCOSE 143* 106* 134* 163* 217*  BUN 61* 50* 39* 36* 40*  CREATININE 3.02* 2.34* 2.15* 2.04* 2.55*  CALCIUM 7.3* 7.6* 7.5* 7.4* 7.4*  PHOS 2.9 3.2 2.9 2.8 3.4   GFR: Estimated Creatinine Clearance: 39.8 mL/min (A) (by C-G formula based on SCr of 2.55 mg/dL (H)). Liver Function Tests: Recent Labs  Lab 09/22/19  0424 09/23/19 0416 09/24/19 0546 09/25/19 0702 09/26/19 0424  ALBUMIN 1.8* 1.9* 1.9* 2.0* 2.1*   No results for input(s): LIPASE, AMYLASE in the last 168 hours. No results for input(s): AMMONIA in the last 168 hours. Coagulation Profile: No results for input(s): INR, PROTIME in the last 168 hours. Cardiac Enzymes: No results for input(s): CKTOTAL, CKMB, CKMBINDEX, TROPONINI in the last 168 hours. BNP (last 3 results) No results for input(s): PROBNP in the last 8760 hours. HbA1C: No results for input(s): HGBA1C in the last 72 hours. CBG: Recent Labs  Lab 09/25/19 0507 09/25/19 1101 09/25/19 1613 09/25/19 2108 09/26/19 0552  GLUCAP 165* 180* 174* 170* 341*   Lipid Profile: No results for input(s): CHOL, HDL, LDLCALC, TRIG, CHOLHDL, LDLDIRECT in the last 72 hours. Thyroid Function Tests: No results for input(s): TSH, T4TOTAL, FREET4, T3FREE, THYROIDAB in the last 72 hours. Anemia Panel: No results for input(s): VITAMINB12, FOLATE, FERRITIN, TIBC, IRON, RETICCTPCT in the last 72 hours. Sepsis Labs: No results for input(s):  PROCALCITON, LATICACIDVEN in the last 168 hours.  Recent Results (from the past 240 hour(s))  SARS CORONAVIRUS 2 (TAT 6-24 HRS) Nasopharyngeal Nasopharyngeal Swab     Status: None   Collection Time: 09/25/19  5:00 PM   Specimen: Nasopharyngeal Swab  Result Value Ref Range Status   SARS Coronavirus 2 NEGATIVE NEGATIVE Final    Comment: (NOTE) SARS-CoV-2 target nucleic acids are NOT DETECTED. The SARS-CoV-2 RNA is generally detectable in upper and lower respiratory specimens during the acute phase of infection. Negative results do not preclude SARS-CoV-2 infection, do not rule out co-infections with other pathogens, and should not be used as the sole basis for treatment or other patient management decisions. Negative results must be combined with clinical observations, patient history, and epidemiological information. The expected result is Negative. Fact Sheet for Patients: SugarRoll.be Fact Sheet for Healthcare Providers: https://www.woods-mathews.com/ This test is not yet approved or cleared by the [place] FDA and  has been authorized for detection and/or diagnosis of SARS-CoV-2 by FDA under an Emergency Use Authorization (EUA). This EUA will remain  in effect (meaning this test can be used) for the duration of the COVID-19 declaration under Section 56 4(b)(1) of the Act, 21 U.S.C. section 360bbb-3(b)(1), unless the authorization is terminated or revoked sooner. Performed at [facility], [place] [address] [place], [place] [address]          Radiology Studies: No results found.      Scheduled Meds: . aspirin EC  81 mg Oral Daily  . atorvastatin  80 mg Oral QPM  . carvedilol  25 mg Oral BID  . clopidogrel  75 mg Oral q morning - 10a  . darbepoetin (ARANESP) injection - NON-DIALYSIS  60 mcg Subcutaneous Q Mon-1800  . doxycycline  100 mg Oral Q12H  . heparin injection (subcutaneous)  5,000 Units Subcutaneous Q8H  .  insulin aspart  0-9 Units Subcutaneous TID WC  . levothyroxine  125 mcg Oral Q0600  . multivitamin  1 tablet Oral QHS  . nystatin   Topical TID  . Ensure Max Protein  11 oz Oral Daily  . saccharomyces boulardii  250 mg Oral BID  . sodium chloride flush  10-40 mL Intracatheter Q12H  . spironolactone  25 mg Oral Daily   Continuous Infusions: . sodium chloride       LOS: 18 days    Time spent: 30 minutes.     Harold Hedge, DO [facility] Pager [phone]  If 7PM-7AM, please contact night-coverage [email or web address] Password [facility] 09/26/2019, 7:38 AM

## 2019-09-26 NOTE — Progress Notes (Signed)
Patient ID: Maureen Jackson, female   DOB: [birth year], 63 y.o.   MRN: [id number] S: We have been reconsulted on Maureen Jackson due to rising Scr and decreased UOP since her foley catheter was removed on 09/24/19.  Maureen Jackson reports good UOP but admits to ocassional incontinence "if they take too long to get me to the toilet". O:BP 125/66 (BP Location: Left Arm)   Pulse 71   Temp 97.9 F (36.6 C) (Oral)   Resp 18   Ht 5\' 5"  (1.651 m)   Wt (!) 186.8 kg   SpO2 100%   BMI 68.53 kg/m   Intake/Output Summary (Last 24 hours) at 09/26/2019 1337 Last data filed at 09/26/2019 1034 Gross per 24 hour  Intake 360 ml  Output 310 ml  Net 50 ml   Intake/Output: I/O last 3 completed shifts: In: 360 [P.O.:360] Out: 451 [Urine:450; Stool:1]  Intake/Output this shift:  Total I/O In: 120 [P.O.:120] Out: 160 [Urine:160] Weight change:  Gen: WD obese AAF in NAD CVS: no rub Resp: cta Abd: +BS, soft, NT Ext: trace edema  Recent Labs  Lab 09/20/19 0429 09/21/19 0337 09/22/19 0424 09/23/19 0416 09/24/19 0546 09/25/19 0702 09/26/19 0424  NA 136 140 140 143 144 142 140  K 3.8 3.9 3.8 3.7 3.8 3.9 4.1  CL 102 105 109 109 112* 110 109  CO2 22 24 23 23 22  21* 20*  GLUCOSE 202* 152* 143* 106* 134* 163* 217*  BUN 71* 68* 61* 50* 39* 36* 40*  CREATININE 3.70* 3.38* 3.02* 2.34* 2.15* 2.04* 2.55*  ALBUMIN 1.7* 1.7* 1.8* 1.9* 1.9* 2.0* 2.1*  CALCIUM 7.6* 7.6* 7.3* 7.6* 7.5* 7.4* 7.4*  PHOS 3.7 3.4 2.9 3.2 2.9 2.8 3.4   Liver Function Tests: Recent Labs  Lab 09/24/19 0546 09/25/19 0702 09/26/19 0424  ALBUMIN 1.9* 2.0* 2.1*   No results for input(s): LIPASE, AMYLASE in the last 168 hours. No results for input(s): AMMONIA in the last 168 hours. CBC: Recent Labs  Lab 09/21/19 0337 09/23/19 0416 09/24/19 0546 09/25/19 0702 09/26/19 0424  WBC 11.7* 8.9 7.0 7.5 7.2  HGB 8.1* 8.4* 8.1* 8.3* 8.3*  HCT 26.7* 27.0* 26.8* 27.7* 26.7*  MCV 90.2 90.3 90.5 92.0 91.8  PLT 209 190 198 207 186    Cardiac Enzymes: No results for input(s): CKTOTAL, CKMB, CKMBINDEX, TROPONINI in the last 168 hours. CBG: Recent Labs  Lab 09/25/19 1101 09/25/19 1613 09/25/19 2108 09/26/19 0552 09/26/19 1125  GLUCAP 180* 174* 170* 341* 198*    Iron Studies: No results for input(s): IRON, TIBC, TRANSFERRIN, FERRITIN in the last 72 hours. Studies/Results: No results found. Marland Kitchen aspirin EC  81 mg Oral Daily  . atorvastatin  80 mg Oral QPM  . carvedilol  25 mg Oral BID  . clopidogrel  75 mg Oral q morning - 10a  . darbepoetin (ARANESP) injection - NON-DIALYSIS  60 mcg Subcutaneous Q Mon-1800  . doxycycline  100 mg Oral Q12H  . heparin injection (subcutaneous)  5,000 Units Subcutaneous Q8H  . insulin aspart  0-9 Units Subcutaneous TID WC  . levothyroxine  125 mcg Oral Q0600  . multivitamin  1 tablet Oral QHS  . nystatin   Topical TID  . saccharomyces boulardii  250 mg Oral BID  . sodium chloride flush  10-40 mL Intracatheter Q12H  . spironolactone  25 mg Oral Daily    BMET    Component Value Date/Time   NA 140 09/26/2019 0424   K 4.1 09/26/2019 0424  CL 109 09/26/2019 0424   CO2 20 (L) 09/26/2019 0424   GLUCOSE 217 (H) 09/26/2019 0424   BUN 40 (H) 09/26/2019 0424   CREATININE 2.55 (H) 09/26/2019 0424   CALCIUM 7.4 (L) 09/26/2019 0424   GFRNONAA 20 (L) 09/26/2019 0424   GFRAA 23 (L) 09/26/2019 0424   CBC    Component Value Date/Time   WBC 7.2 09/26/2019 0424   RBC 2.91 (L) 09/26/2019 0424   HGB 8.3 (L) 09/26/2019 0424   HCT 26.7 (L) 09/26/2019 0424   PLT 186 09/26/2019 0424   MCV 91.8 09/26/2019 0424   MCH 28.5 09/26/2019 0424   MCHC 31.1 09/26/2019 0424   RDW 16.0 (H) 09/26/2019 0424   LYMPHSABS 2.2 09/17/2019 0947   MONOABS 0.8 09/17/2019 0947   EOSABS 0.6 (H) 09/17/2019 0947   BASOSABS 0.0 09/17/2019 0947     Assessment/Plan:  1. AKI- presumably due to combination of ischemic ATN as well as vancomycin nephrotoxicity.  She underwent HD on 10/6 and again on 09/18/19  with nontunneled HD catheter which was removed 09/23/19.  Since then, her UOP has improved as well as her BUN/Cr we signed off but now with bump in Cr.  Given rise in Cr since removing foley, will order bladder scan as she may have some issues with bladder emptying.  If pvr >300 ml would perform I&O cath.  1. Cont to follow renal function 2. Agree with holding entresto but ok to resume po lasix  3. Will need f/u with our office after discharge 2. Left labial abscess with sepsis- improving with antibiotics and wound care/drainage. 3. AMS- improving.  Multifactorial with sepsis and AKI/meds, now resolved. 4. Anemia- due to acute/critical illness.  Aranesp started on 09/15/19 and s/p feraheme.  Hgb stable.  Transfuse prn.  Donetta Potts, MD [employer] [phone]

## 2019-09-26 NOTE — Plan of Care (Signed)

## 2019-09-27 LAB — RENAL FUNCTION PANEL
Albumin: 2.2 g/dL — ABNORMAL LOW (ref 3.5–5.0)
Anion gap: 11 (ref 5–15)
BUN: 45 mg/dL — ABNORMAL HIGH (ref 8–23)
CO2: 19 mmol/L — ABNORMAL LOW (ref 22–32)
Calcium: 7.3 mg/dL — ABNORMAL LOW (ref 8.9–10.3)
Chloride: 108 mmol/L (ref 98–111)
Creatinine, Ser: 2.39 mg/dL — ABNORMAL HIGH (ref 0.44–1.00)
GFR calc Af Amer: 25 mL/min — ABNORMAL LOW (ref >60)
GFR calc non Af Amer: 21 mL/min — ABNORMAL LOW (ref >60)
Glucose, Bld: 221 mg/dL — ABNORMAL HIGH (ref 70–99)
Phosphorus: 4 mg/dL (ref 2.5–4.6)
Potassium: 4.2 mmol/L (ref 3.5–5.1)
Sodium: 138 mmol/L (ref 135–145)

## 2019-09-27 LAB — GLUCOSE, CAPILLARY
Glucose-Capillary: 189 mg/dL — ABNORMAL HIGH (ref 70–99)
Glucose-Capillary: 285 mg/dL — ABNORMAL HIGH (ref 70–99)
Glucose-Capillary: 227 mg/dL — ABNORMAL HIGH (ref 70–99)
Glucose-Capillary: 248 mg/dL — ABNORMAL HIGH (ref 70–99)

## 2019-09-27 MED ORDER — FUROSEMIDE 40 MG PO TABS
40.0000 mg | ORAL_TABLET | Freq: Every day | ORAL | Status: DC
  Administered 2019-09-27 – 2019-09-29 (×3): 40 mg via ORAL
  Filled 2019-09-27 (×3): qty 1

## 2019-09-27 NOTE — TOC Progression Note (Signed)
Transition of Care [facility]) - Progression Note    Patient Details  Name: Maureen Jackson MRN: [id number] Date of Birth: [birth year]  Transition of Care [facility]) CM/SW Contact  Zenon Mayo, RN Phone Number: 09/27/2019, 5:49 PM  Clinical Narrative:    NCM spoke with daughter [name] ,informed her left paper work for [facility] in patient room for her to come and fill out and then give back to NCM to fax to [place] for poss adm on Wed if patient ready.   Expected Discharge Plan: Skilled Nursing Facility Barriers to Discharge: No Barriers Identified  Expected Discharge Plan and Services Expected Discharge Plan: [place] In-house Referral: NA Discharge Planning Services: CM Consult Post Acute Care Choice: [place] Living arrangements for the past 2 months: Apartment                 DME Arranged: (NA)         HH Arranged: NA           Social Determinants of Health (SDOH) Interventions    Readmission Risk Interventions Readmission Risk Prevention Plan 09/18/2019  Transportation Screening Complete  HRI or [place] Complete  Social Work Consult for [place] Planning/Counseling Complete  Palliative Care Screening Not Applicable  Medication Review [employer]) Complete  Some recent data might be hidden

## 2019-09-27 NOTE — Progress Notes (Signed)
Patient ID: Maureen Jackson, female   DOB: Apr 20, 1956, 63 y.o.   MRN: [id number] S: Feels well no complaints.  Reports that she is urinating but has been incontinent at times. O:BP 129/66 (BP Location: Right Arm)   Pulse 75   Temp 98.4 F (36.9 C) (Oral)   Resp 18   Ht 5\' 5"  (1.651 m)   Wt (!) 155.3 kg   SpO2 100%   BMI 56.96 kg/m   Intake/Output Summary (Last 24 hours) at 09/27/2019 1051 Last data filed at 09/27/2019 1031 Gross per 24 hour  Intake 610 ml  Output 301 ml  Net 309 ml   Intake/Output: I/O last 3 completed shifts: In: 55 [P.O.:720] Out: 64 [Urine:610; Stool:1]  Intake/Output this shift:  Total I/O In: 250 [P.O.:240; I.V.:10] Out: -  Weight change:  Gen: morbidly obese AAF in NAD CVS: no rub Resp: cta Abd: obese, +BS, soft, mildly tender in lower quadrants Ext: minimal pretibial edema  Recent Labs  Lab 09/21/19 0337 09/22/19 0424 09/23/19 0416 09/24/19 0546 09/25/19 0702 09/26/19 0424 09/27/19 0904  NA 140 140 143 144 142 140 138  K 3.9 3.8 3.7 3.8 3.9 4.1 4.2  CL 105 109 109 112* 110 109 108  CO2 24 23 23 22  21* 20* 19*  GLUCOSE 152* 143* 106* 134* 163* 217* 221*  BUN 68* 61* 50* 39* 36* 40* 45*  CREATININE 3.38* 3.02* 2.34* 2.15* 2.04* 2.55* 2.39*  ALBUMIN 1.7* 1.8* 1.9* 1.9* 2.0* 2.1* 2.2*  CALCIUM 7.6* 7.3* 7.6* 7.5* 7.4* 7.4* 7.3*  PHOS 3.4 2.9 3.2 2.9 2.8 3.4 4.0   Liver Function Tests: Recent Labs  Lab 09/25/19 0702 09/26/19 0424 09/27/19 0904  ALBUMIN 2.0* 2.1* 2.2*   No results for input(s): LIPASE, AMYLASE in the last 168 hours. No results for input(s): AMMONIA in the last 168 hours. CBC: Recent Labs  Lab 09/21/19 0337 09/23/19 0416 09/24/19 0546 09/25/19 0702 09/26/19 0424  WBC 11.7* 8.9 7.0 7.5 7.2  HGB 8.1* 8.4* 8.1* 8.3* 8.3*  HCT 26.7* 27.0* 26.8* 27.7* 26.7*  MCV 90.2 90.3 90.5 92.0 91.8  PLT 209 190 198 207 186   Cardiac Enzymes: No results for input(s): CKTOTAL, CKMB, CKMBINDEX, TROPONINI in the last 168  hours. CBG: Recent Labs  Lab 09/26/19 0552 09/26/19 1125 09/26/19 1621 09/26/19 2149 09/27/19 0626  GLUCAP 341* 198* 268* 260* 189*    Iron Studies: No results for input(s): IRON, TIBC, TRANSFERRIN, FERRITIN in the last 72 hours. Studies/Results: No results found. Marland Kitchen aspirin EC  81 mg Oral Daily  . atorvastatin  80 mg Oral QPM  . carvedilol  25 mg Oral BID  . clopidogrel  75 mg Oral q morning - 10a  . darbepoetin (ARANESP) injection - NON-DIALYSIS  60 mcg Subcutaneous Q Mon-1800  . furosemide  40 mg Oral Daily  . heparin injection (subcutaneous)  5,000 Units Subcutaneous Q8H  . insulin aspart  0-9 Units Subcutaneous TID WC  . levothyroxine  125 mcg Oral Q0600  . multivitamin  1 tablet Oral QHS  . nystatin   Topical TID  . saccharomyces boulardii  250 mg Oral BID  . sodium chloride flush  10-40 mL Intracatheter Q12H  . spironolactone  25 mg Oral Daily    BMET    Component Value Date/Time   NA 138 09/27/2019 0904   K 4.2 09/27/2019 0904   CL 108 09/27/2019 0904   CO2 19 (L) 09/27/2019 0904   GLUCOSE 221 (H) 09/27/2019 0904   BUN  45 (H) 09/27/2019 0904   CREATININE 2.39 (H) 09/27/2019 0904   CALCIUM 7.3 (L) 09/27/2019 0904   GFRNONAA 21 (L) 09/27/2019 0904   GFRAA 25 (L) 09/27/2019 0904   CBC    Component Value Date/Time   WBC 7.2 09/26/2019 0424   RBC 2.91 (L) 09/26/2019 0424   HGB 8.3 (L) 09/26/2019 0424   HCT 26.7 (L) 09/26/2019 0424   PLT 186 09/26/2019 0424   MCV 91.8 09/26/2019 0424   MCH 28.5 09/26/2019 0424   MCHC 31.1 09/26/2019 0424   RDW 16.0 (H) 09/26/2019 0424   LYMPHSABS 2.2 09/17/2019 0947   MONOABS 0.8 09/17/2019 0947   EOSABS 0.6 (H) 09/17/2019 0947   BASOSABS 0.0 09/17/2019 0947    Assessment/Plan:  1. AKI- presumably due to combination of ischemic ATN as well as vancomycin nephrotoxicity. She underwent HD on 10/6 and again on 09/18/19 with nontunneled HD catheter which was removed 09/23/19. Since then, her UOP has improved as well as  her BUN/Cr we signed off but now with bump in Cr.  Given rise in Cr since removing foley, bladder scan difficult given body habitus (could not see bladder on renal US).  She admits to urinating and some incontinence as well.   1. Continue bladder scans each morning.  If pvr >300 ml would perform I&O cath.  2. Creatinine has improved to 2.39 despite poor documented UOP.  Question amount due to incontinence.  Cont to follow renal function 3. Agree with holding entresto but ok to resume po lasix  4. Will need f/u with our office after discharge 2. Left labial abscess with sepsis- improving with antibiotics and wound care/drainage. 3. AMS- improving. Multifactorial with sepsis and AKI/meds, now resolved. 4. Anemia- due to acute/critical illness. Aranesp started on 09/15/19 and s/p feraheme. Hgb stable. Transfuse prn.  Donetta Potts, MD [employer] [phone]

## 2019-09-27 NOTE — Progress Notes (Signed)
Pt has not ben wanting to wear CPAP.

## 2019-09-27 NOTE — Progress Notes (Signed)
PROGRESS NOTE    Maureen Jackson  DEY:[id number] DOB: 02-Oct-1956 DOA: 09/08/2019 PCP: Neale Burly, MD   Brief Narrative: 63 year old with past medical history significant for morbid obesity, systolic heart failure, hypothyroidism, hypertension, CAD and obstructive sleep apnea.  Presents with  generalized weakness, difficulty voiding, poor appetite and insomnia for 3 days.  September 26,  2 days prior to hospitalization she was seen in the emergency department for left labia upper thigh abscess which was drained.  Discharge home on oral antibiotics.  Patient had worsening renal function in the setting of concern for Fournier gangrene, patient was transferred from [facility]  to [facility] for further management. Repeat CT scan done on 10/5 findings worrisome for Fournier gangrene: Patient was taken to the OR by general surgery on 10/6. Was also noted to be in an acute HF exacerbation. Nephrology was consulted, patient was a started on hemodialysis on 10/6 and had HD on 10-08.   Nephro signed off 10/13 with improvement in urine output and volume status.       Assessment & Plan:   Principal Problem:   Fournier's gangrene in female Active Problems:   Hypothyroidism   Essential hypertension   Morbid obesity/BMI > 55   Diabetes mellitus type 2 with complications, uncontrolled (HCC)   CAD, multiple vessel   Ischemic cardiomyopathy   Chronic combined systolic and diastolic CHF (congestive heart failure) (EF 30 to 35 %)   CAP (community acquired pneumonia)   Labial abscess   Abscess   Fistula   AMS (altered mental status)   Drug rash   Acute renal failure (ARF) (HCC)   Pressure injury of skin  Labial abscess/Fournier gangrene complicated with sepsis/acute metabolic encephalopathy Patient presented with leukocytosis, fever post sediment in Foley.  -ID was consulted and had been helping with antibiotics, currently signed off, antibiotics has been changed from Zyvox, Unasyn  to Levaquin, and  Doxycycline in light of developing rash -Surgery recommending wet-to-dry dressing twice a day -Completed 10 days antibiotics with levaquin and doxycycline  -Continue with wound care.   Rash;Stable Benadryl cream and oral benadryl PRN.  Unasyn and Zyvox discontinued.,  Antibiotics as above.  Can use moisturizer cream to back prn  AKI; has improved following HD treatments however has had fluctuating creatinine above baseline, creatinine now at 2.0-2.5. -Continue holding home Entresto until renal function improves -Discussed with nephrology: Okay to restart home Lasix, will need follow-up outpatient with nephro  Acute on chronic systolic heart failure exacerbation, resolved. 58 kg (10/13)-> 186 kg (unlikely accurate). -Continue aspirin/ plavix.   -Restarted home carvedilol and spironolactone -Nephrology agreed to restart home Lasix -Continue holding Entresto   Diabetes: Continue with sliding scale insulin  Morbid obesity with OSA: Continue with CPAP Body mass index 60  Hypothyroidism Continue with Synthroid.  Acute metabolic encephalopathy: Resolved.  Related to acute illness renal failure sepsis infection.     Nutrition Problem: Inadequate oral intake Etiology: acute illness(pneumonia; abscess in left labia/upper thigh area)    Signs/Symptoms: meal completion < 25%    Interventions: MVI, Magic cup  Estimated body mass index is 56.96 kg/m as calculated from the following:   Height as of this encounter: 5\' 5"  (1.651 m).   Weight as of this encounter: 155.3 kg.   DVT prophylaxis: Heparin Code Status: Full code Discussed plan last night with daughter at bedside Disposition Plan: Awaiting placement to SNF, likely Wednesday discharge Consultants:   surgery  ID  Nephrology  Procedures:   Irrigation and  debridement months abscess 6 x 6 by day 10/6  Had HD on 10/6 and 10-08  Antimicrobials:  Levaquin and doxy Received Zyvox, Unasyn  Subjective: Patient seen  and examined at bedside no acute distress and resting comfortably.  No events overnight.  Tolerating diet.  Denies any chest pain, shortness of breath, fever, nausea, vomiting, urinary complaints.  Admits to having bowel movement.  Otherwise ROS negative     Objective: Vitals:   09/27/19 0128 09/27/19 0408 09/27/19 0654 09/27/19 0723  BP: (!) 142/46 133/63  129/66  Pulse: 69 76  75  Resp: 20 19  18   Temp: 97.8 F (36.6 C) 98 F (36.7 C)  98.4 F (36.9 C)  TempSrc: Oral Oral  Oral  SpO2: 100% 99%  100%  Weight:   (!) 155.3 kg   Height:        Intake/Output Summary (Last 24 hours) at 09/27/2019 0742 Last data filed at 09/27/2019 0603 Gross per 24 hour  Intake 480 ml  Output 461 ml  Net 19 ml   Filed Weights   09/24/19 0556 09/25/19 0529 09/27/19 0654  Weight: (!) 186.9 kg (!) 186.8 kg (!) 155.3 kg    Examination: Physical Exam Vitals signs and nursing note reviewed.  Constitutional:      Appearance: She is obese.  HENT:     Head: Normocephalic and atraumatic.     Nose: Nose normal.     Mouth/Throat:     Mouth: Mucous membranes are moist.  Eyes:     Extraocular Movements: Extraocular movements intact.  Neck:     Musculoskeletal: Normal range of motion. No neck rigidity.  Cardiovascular:     Rate and Rhythm: Normal rate and regular rhythm.  Pulmonary:     Effort: Pulmonary effort is normal. No respiratory distress.     Breath sounds: Normal breath sounds.  Abdominal:     General: Abdomen is flat.     Palpations: Abdomen is soft.  Musculoskeletal: Normal range of motion.        General: No swelling.  Skin:    Comments: Dry skin on back  Neurological:     General: No focal deficit present.     Mental Status: She is alert. Mental status is at baseline.        Data Reviewed: I have personally reviewed following labs and imaging studies  CBC: Recent Labs  Lab 09/21/19 0337 09/23/19 0416 09/24/19 0546 09/25/19 0702 09/26/19 0424  WBC 11.7* 8.9 7.0  7.5 7.2  HGB 8.1* 8.4* 8.1* 8.3* 8.3*  HCT 26.7* 27.0* 26.8* 27.7* 26.7*  MCV 90.2 90.3 90.5 92.0 91.8  PLT 209 190 198 207 789   Basic Metabolic Panel: Recent Labs  Lab 09/22/19 0424 09/23/19 0416 09/24/19 0546 09/25/19 0702 09/26/19 0424  NA 140 143 144 142 140  K 3.8 3.7 3.8 3.9 4.1  CL 109 109 112* 110 109  CO2 23 23 22  21* 20*  GLUCOSE 143* 106* 134* 163* 217*  BUN 61* 50* 39* 36* 40*  CREATININE 3.02* 2.34* 2.15* 2.04* 2.55*  CALCIUM 7.3* 7.6* 7.5* 7.4* 7.4*  PHOS 2.9 3.2 2.9 2.8 3.4   GFR: Estimated Creatinine Clearance: 35.2 mL/min (A) (by C-G formula based on SCr of 2.55 mg/dL (H)). Liver Function Tests: Recent Labs  Lab 09/22/19 0424 09/23/19 0416 09/24/19 0546 09/25/19 0702 09/26/19 0424  ALBUMIN 1.8* 1.9* 1.9* 2.0* 2.1*   No results for input(s): LIPASE, AMYLASE in the last 168 hours. No results for input(s): AMMONIA  in the last 168 hours. Coagulation Profile: No results for input(s): INR, PROTIME in the last 168 hours. Cardiac Enzymes: No results for input(s): CKTOTAL, CKMB, CKMBINDEX, TROPONINI in the last 168 hours. BNP (last 3 results) No results for input(s): PROBNP in the last 8760 hours. HbA1C: No results for input(s): HGBA1C in the last 72 hours. CBG: Recent Labs  Lab 09/26/19 0552 09/26/19 1125 09/26/19 1621 09/26/19 2149 09/27/19 0626  GLUCAP 341* 198* 268* 260* 189*   Lipid Profile: No results for input(s): CHOL, HDL, LDLCALC, TRIG, CHOLHDL, LDLDIRECT in the last 72 hours. Thyroid Function Tests: No results for input(s): TSH, T4TOTAL, FREET4, T3FREE, THYROIDAB in the last 72 hours. Anemia Panel: No results for input(s): VITAMINB12, FOLATE, FERRITIN, TIBC, IRON, RETICCTPCT in the last 72 hours. Sepsis Labs: No results for input(s): PROCALCITON, LATICACIDVEN in the last 168 hours.  Recent Results (from the past 240 hour(s))  SARS CORONAVIRUS 2 (TAT 6-24 HRS) Nasopharyngeal Nasopharyngeal Swab     Status: None   Collection Time:  09/25/19  5:00 PM   Specimen: Nasopharyngeal Swab  Result Value Ref Range Status   SARS Coronavirus 2 NEGATIVE NEGATIVE Final    Comment: (NOTE) SARS-CoV-2 target nucleic acids are NOT DETECTED. The SARS-CoV-2 RNA is generally detectable in upper and lower respiratory specimens during the acute phase of infection. Negative results do not preclude SARS-CoV-2 infection, do not rule out co-infections with other pathogens, and should not be used as the sole basis for treatment or other patient management decisions. Negative results must be combined with clinical observations, patient history, and epidemiological information. The expected result is Negative. Fact Sheet for Patients: SugarRoll.be Fact Sheet for Healthcare Providers: https://www.woods-mathews.com/ This test is not yet approved or cleared by the [place] FDA and  has been authorized for detection and/or diagnosis of SARS-CoV-2 by FDA under an Emergency Use Authorization (EUA). This EUA will remain  in effect (meaning this test can be used) for the duration of the COVID-19 declaration under Section 56 4(b)(1) of the Act, 21 U.S.C. section 360bbb-3(b)(1), unless the authorization is terminated or revoked sooner. Performed at [facility], [place] [address] [place], [place] [address]          Radiology Studies: No results found.      Scheduled Meds: . aspirin EC  81 mg Oral Daily  . atorvastatin  80 mg Oral QPM  . carvedilol  25 mg Oral BID  . clopidogrel  75 mg Oral q morning - 10a  . darbepoetin (ARANESP) injection - NON-DIALYSIS  60 mcg Subcutaneous Q Mon-1800  . furosemide  40 mg Oral Daily  . heparin injection (subcutaneous)  5,000 Units Subcutaneous Q8H  . insulin aspart  0-9 Units Subcutaneous TID WC  . levothyroxine  125 mcg Oral Q0600  . multivitamin  1 tablet Oral QHS  . nystatin   Topical TID  . saccharomyces boulardii  250 mg Oral BID  . sodium  chloride flush  10-40 mL Intracatheter Q12H  . spironolactone  25 mg Oral Daily   Continuous Infusions: . sodium chloride       LOS: 19 days    Time spent: 15 minutes.     Harold Hedge, DO [facility] Pager [phone]  If 7PM-7AM, please contact night-coverage [email or web address] Password [email or web address] 09/27/2019, 7:42 AM

## 2019-09-28 LAB — RENAL FUNCTION PANEL
Albumin: 2.2 g/dL — ABNORMAL LOW (ref 3.5–5.0)
Anion gap: 10 (ref 5–15)
BUN: 47 mg/dL — ABNORMAL HIGH (ref 8–23)
CO2: 21 mmol/L — ABNORMAL LOW (ref 22–32)
Calcium: 7.3 mg/dL — ABNORMAL LOW (ref 8.9–10.3)
Chloride: 108 mmol/L (ref 98–111)
Creatinine, Ser: 2.21 mg/dL — ABNORMAL HIGH (ref 0.44–1.00)
GFR calc Af Amer: 27 mL/min — ABNORMAL LOW (ref >60)
GFR calc non Af Amer: 23 mL/min — ABNORMAL LOW (ref >60)
Glucose, Bld: 192 mg/dL — ABNORMAL HIGH (ref 70–99)
Phosphorus: 4.5 mg/dL (ref 2.5–4.6)
Potassium: 4.6 mmol/L (ref 3.5–5.1)
Sodium: 139 mmol/L (ref 135–145)

## 2019-09-28 LAB — GLUCOSE, CAPILLARY
Glucose-Capillary: 169 mg/dL — ABNORMAL HIGH (ref 70–99)
Glucose-Capillary: 188 mg/dL — ABNORMAL HIGH (ref 70–99)
Glucose-Capillary: 174 mg/dL — ABNORMAL HIGH (ref 70–99)
Glucose-Capillary: 196 mg/dL — ABNORMAL HIGH (ref 70–99)

## 2019-09-28 NOTE — Progress Notes (Signed)
Patient ID: Maureen Jackson, female   DOB: 28-Apr-1956, 63 y.o.   MRN: [id number] S: Feels well, no complaints.  Admits to incontinence this am O:BP (!) 147/77   Pulse 77   Temp 97.8 F (36.6 C) (Oral)   Resp 14   Ht 5\' 5"  (1.651 m)   Wt (!) 155.3 kg   SpO2 100%   BMI 56.96 kg/m   Intake/Output Summary (Last 24 hours) at 09/28/2019 1023 Last data filed at 09/28/2019 0900 Gross per 24 hour  Intake 1050 ml  Output 150 ml  Net 900 ml   Intake/Output: I/O last 3 completed shifts: In: 1430 [P.O.:1420; I.V.:10] Out: 450 [Urine:450]  Intake/Output this shift:  Total I/O In: 220 [P.O.:220] Out: -  Weight change:  Gen: obese AAF in NAD  CVS: no rub Resp: cta Abd: obese, +BS, soft, mildly tender Ext: trace to 1+ pretibial edema  Recent Labs  Lab 09/22/19 0424 09/23/19 0416 09/24/19 0546 09/25/19 0702 09/26/19 0424 09/27/19 0904  NA 140 143 144 142 140 138  K 3.8 3.7 3.8 3.9 4.1 4.2  CL 109 109 112* 110 109 108  CO2 23 23 22  21* 20* 19*  GLUCOSE 143* 106* 134* 163* 217* 221*  BUN 61* 50* 39* 36* 40* 45*  CREATININE 3.02* 2.34* 2.15* 2.04* 2.55* 2.39*  ALBUMIN 1.8* 1.9* 1.9* 2.0* 2.1* 2.2*  CALCIUM 7.3* 7.6* 7.5* 7.4* 7.4* 7.3*  PHOS 2.9 3.2 2.9 2.8 3.4 4.0   Liver Function Tests: Recent Labs  Lab 09/25/19 0702 09/26/19 0424 09/27/19 0904  ALBUMIN 2.0* 2.1* 2.2*   No results for input(s): LIPASE, AMYLASE in the last 168 hours. No results for input(s): AMMONIA in the last 168 hours. CBC: Recent Labs  Lab 09/23/19 0416 09/24/19 0546 09/25/19 0702 09/26/19 0424  WBC 8.9 7.0 7.5 7.2  HGB 8.4* 8.1* 8.3* 8.3*  HCT 27.0* 26.8* 27.7* 26.7*  MCV 90.3 90.5 92.0 91.8  PLT 190 198 207 186   Cardiac Enzymes: No results for input(s): CKTOTAL, CKMB, CKMBINDEX, TROPONINI in the last 168 hours. CBG: Recent Labs  Lab 09/27/19 0626 09/27/19 1122 09/27/19 1600 09/27/19 2150 09/28/19 0726  GLUCAP 189* 285* 227* 248* 169*    Iron Studies: No results for input(s):  IRON, TIBC, TRANSFERRIN, FERRITIN in the last 72 hours. Studies/Results: No results found. Marland Kitchen aspirin EC  81 mg Oral Daily  . atorvastatin  80 mg Oral QPM  . carvedilol  25 mg Oral BID  . clopidogrel  75 mg Oral q morning - 10a  . darbepoetin (ARANESP) injection - NON-DIALYSIS  60 mcg Subcutaneous Q Mon-1800  . furosemide  40 mg Oral Daily  . heparin injection (subcutaneous)  5,000 Units Subcutaneous Q8H  . insulin aspart  0-9 Units Subcutaneous TID WC  . levothyroxine  125 mcg Oral Q0600  . multivitamin  1 tablet Oral QHS  . nystatin   Topical TID  . saccharomyces boulardii  250 mg Oral BID  . sodium chloride flush  10-40 mL Intracatheter Q12H  . spironolactone  25 mg Oral Daily    BMET    Component Value Date/Time   NA 138 09/27/2019 0904   K 4.2 09/27/2019 0904   CL 108 09/27/2019 0904   CO2 19 (L) 09/27/2019 0904   GLUCOSE 221 (H) 09/27/2019 0904   BUN 45 (H) 09/27/2019 0904   CREATININE 2.39 (H) 09/27/2019 0904   CALCIUM 7.3 (L) 09/27/2019 0904   GFRNONAA 21 (L) 09/27/2019 0904   GFRAA 25 (L)  09/27/2019 0904   CBC    Component Value Date/Time   WBC 7.2 09/26/2019 0424   RBC 2.91 (L) 09/26/2019 0424   HGB 8.3 (L) 09/26/2019 0424   HCT 26.7 (L) 09/26/2019 0424   PLT 186 09/26/2019 0424   MCV 91.8 09/26/2019 0424   MCH 28.5 09/26/2019 0424   MCHC 31.1 09/26/2019 0424   RDW 16.0 (H) 09/26/2019 0424   LYMPHSABS 2.2 09/17/2019 0947   MONOABS 0.8 09/17/2019 0947   EOSABS 0.6 (H) 09/17/2019 0947   BASOSABS 0.0 09/17/2019 0947    Assessment/Plan:  1. AKI- presumably due to combination of ischemic ATN as well as vancomycin nephrotoxicity. She underwent HD on 10/6 and again on 09/18/19 with nontunneled HD catheterwhich was removed 09/23/19. Since then, her UOP has improved as well as her BUN/Crwe signed off but now with bump in Cr. Given rise in Cr since removing foley, bladder scan difficult given body habitus (could not see bladder on renal US).  She admits to  urinating and some incontinence as well.   1. Continue bladder scans each morning.  If pvr >300 ml would perform I&O cath. 2. Creatinine has improved to 2.39 despite poor documented UOP.  Question amount due to incontinence.  Cont to follow renal function which unfortunately is still pending at this time.  3. Agree with holding entresto but ok to resume po lasix  4. Will need f/u with our office after discharge 2. Left labial abscess with sepsis- improving with antibiotics and wound care/drainage. 3. AMS- improving. Multifactorial with sepsis and AKI/meds, now resolved. 4. Anemia- due to acute/critical illness. Aranesp started on 09/15/19 and s/p feraheme. Hgb stable. Transfuse prn.  Donetta Potts, MD [employer] [phone]

## 2019-09-28 NOTE — Progress Notes (Signed)
PROGRESS NOTE    Maureen Jackson  JOI:[id number] DOB: 07-17-1956 DOA: 09/08/2019 PCP: Neale Burly, MD   Brief Narrative: 63 year old with past medical history significant for morbid obesity, systolic heart failure, hypothyroidism, hypertension, CAD and obstructive sleep apnea.  Presents with  generalized weakness, difficulty voiding, poor appetite and insomnia for 3 days.  September 26,  2 days prior to hospitalization she was seen in the emergency department for left labia upper thigh abscess which was drained.  Discharge home on oral antibiotics.  Patient had worsening renal function in the setting of concern for Fournier gangrene, patient was transferred from [facility]  to [facility] for further management. Repeat CT scan done on 10/5 findings worrisome for Fournier gangrene: Patient was taken to the OR by general surgery on 10/6. Was also noted to be in an acute HF exacerbation. Nephrology was consulted, patient was a started on hemodialysis on 10/6 and had HD on 10-08.   Nephro signed off 10/13 with improvement in urine output and volume status for patient and recurrence of AKI and this was discussed with nephro with continuation of Lasix, spironolactone bladder scan/straight cath protocol.  Patient currently awaiting placement to SNF, likely Wednesday     Assessment & Plan:   Principal Problem:   Fournier's gangrene in female Active Problems:   Hypothyroidism   Essential hypertension   Morbid obesity/BMI > 55   Diabetes mellitus type 2 with complications, uncontrolled ([place])   CAD, multiple vessel   Ischemic cardiomyopathy   Chronic combined systolic and diastolic CHF (congestive heart failure) (EF 30 to 35 %)   CAP (community acquired pneumonia)   Labial abscess   Abscess   Fistula   AMS (altered mental status)   Drug rash   Acute renal failure (ARF) (HCC)   Pressure injury of skin  Labial abscess/Fournier gangrene complicated with sepsis/acute metabolic encephalopathy  Patient presented with leukocytosis, fever post sediment in [place]. ID was consulted and had been helping with antibiotics, currently signed off, antibiotics had been changed from Zyvox, Unasyn  to Levaquin and Doxycycline in light of developing rash.  Completed 10 days antibiotics. -Surgery recommending wet-to-dry dressing twice a day   Nausea Continue antiemetics  Rash/dry skin;Stable Benadryl cream and oral benadryl PRN.  Unasyn and Zyvox discontinued.,  Completed antibiotics as above.  Can use moisturizer cream to back prn  AKI; has improved following HD treatments however has had fluctuating creatinine above baseline, creatinine 2.4 -> 2.2 today.  Resumed p.o. Lasix yesterday -Continue holding home Entresto until renal function improves -Will need follow-up outpatient with nephro  Acute on chronic systolic heart failure exacerbation, resolved. 58 kg (10/13)-> 186 kg (unlikely accurate).  On home carvedilol and spironolactone then home Lasix -Continue aspirin/ plavix.   -Continue holding Entresto   Diabetes: Continue with sliding scale insulin  Morbid obesity with OSA: Continue with CPAP Body mass index 60  Hypothyroidism Continue with Synthroid.  Acute metabolic encephalopathy: Resolved.  Related to acute illness renal failure sepsis infection.     Nutrition Problem: Inadequate oral intake Etiology: acute illness(pneumonia; abscess in left labia/upper thigh area)    Signs/Symptoms: meal completion < 25%    Interventions: MVI, Magic cup  Estimated body mass index is 56.96 kg/m as calculated from the following:   Height as of this encounter: 5\' 5"  (1.651 m).   Weight as of this encounter: 155.3 kg.   DVT prophylaxis: Heparin Code Status: Full code Disposition Plan: Awaiting placement to SNF, likely Wednesday discharge Consultants:  surgery  ID  Nephrology  Procedures:   Irrigation and debridement months abscess 6 x 6 by day 10/6  Had HD on 10/6 and  10-08  Antimicrobials:  Levaquin and doxy Received Zyvox, Unasyn  Subjective:  Patient sitting upright in chair at bedside today.  Complaints of nausea.  She had received Zofran for this minutes before my arrival.  Denies any vomiting.  Denies any chest pain, shortness of breath, urinary complaints.  No other complaints     Objective: Vitals:   09/27/19 2103 09/28/19 0112 09/28/19 0659 09/28/19 0727  BP: 114/67 122/76 122/65 (!) 147/77  Pulse: 67 66 64 77  Resp: 16 16 14    Temp: 97.7 F (36.5 C) 98.3 F (36.8 C) 97.8 F (36.6 C) 97.8 F (36.6 C)  TempSrc: Oral Oral Oral Oral  SpO2: 100% 100% 99% 100%  Weight:      Height:        Intake/Output Summary (Last 24 hours) at 09/28/2019 0803 Last data filed at 09/28/2019 0600 Gross per 24 hour  Intake 1070 ml  Output 150 ml  Net 920 ml   Filed Weights   09/24/19 0556 09/25/19 0529 09/27/19 0654  Weight: (!) 186.9 kg (!) 186.8 kg (!) 155.3 kg    Examination: Physical Exam Vitals signs and nursing note reviewed.  Constitutional:      Appearance: Normal appearance.  HENT:     Head: Normocephalic and atraumatic.     Nose: Nose normal.     Mouth/Throat:     Mouth: Mucous membranes are moist.  Eyes:     Extraocular Movements: Extraocular movements intact.  Neck:     Musculoskeletal: Normal range of motion. No neck rigidity.  Cardiovascular:     Rate and Rhythm: Normal rate and regular rhythm.  Pulmonary:     Effort: Pulmonary effort is normal.     Breath sounds: Normal breath sounds.  Abdominal:     General: Bowel sounds are normal.     Palpations: Abdomen is soft.  Neurological:     General: No focal deficit present.     Mental Status: She is alert. Mental status is at baseline.  Psychiatric:        Mood and Affect: Mood normal.        Behavior: Behavior normal.        Data Reviewed: I have personally reviewed following labs and imaging studies  CBC: Recent Labs  Lab 09/23/19 0416 09/24/19 0546  09/25/19 0702 09/26/19 0424  WBC 8.9 7.0 7.5 7.2  HGB 8.4* 8.1* 8.3* 8.3*  HCT 27.0* 26.8* 27.7* 26.7*  MCV 90.3 90.5 92.0 91.8  PLT 190 198 207 956   Basic Metabolic Panel: Recent Labs  Lab 09/23/19 0416 09/24/19 0546 09/25/19 0702 09/26/19 0424 09/27/19 0904  NA 143 144 142 140 138  K 3.7 3.8 3.9 4.1 4.2  CL 109 112* 110 109 108  CO2 23 22 21* 20* 19*  GLUCOSE 106* 134* 163* 217* 221*  BUN 50* 39* 36* 40* 45*  CREATININE 2.34* 2.15* 2.04* 2.55* 2.39*  CALCIUM 7.6* 7.5* 7.4* 7.4* 7.3*  PHOS 3.2 2.9 2.8 3.4 4.0   GFR: Estimated Creatinine Clearance: 37.6 mL/min (A) (by C-G formula based on SCr of 2.39 mg/dL (H)). Liver Function Tests: Recent Labs  Lab 09/23/19 0416 09/24/19 0546 09/25/19 0702 09/26/19 0424 09/27/19 0904  ALBUMIN 1.9* 1.9* 2.0* 2.1* 2.2*   No results for input(s): LIPASE, AMYLASE in the last 168 hours. No results for input(s): AMMONIA  in the last 168 hours. Coagulation Profile: No results for input(s): INR, PROTIME in the last 168 hours. Cardiac Enzymes: No results for input(s): CKTOTAL, CKMB, CKMBINDEX, TROPONINI in the last 168 hours. BNP (last 3 results) No results for input(s): PROBNP in the last 8760 hours. HbA1C: No results for input(s): HGBA1C in the last 72 hours. CBG: Recent Labs  Lab 09/27/19 0626 09/27/19 1122 09/27/19 1600 09/27/19 2150 09/28/19 0726  GLUCAP 189* 285* 227* 248* 169*   Lipid Profile: No results for input(s): CHOL, HDL, LDLCALC, TRIG, CHOLHDL, LDLDIRECT in the last 72 hours. Thyroid Function Tests: No results for input(s): TSH, T4TOTAL, FREET4, T3FREE, THYROIDAB in the last 72 hours. Anemia Panel: No results for input(s): VITAMINB12, FOLATE, FERRITIN, TIBC, IRON, RETICCTPCT in the last 72 hours. Sepsis Labs: No results for input(s): PROCALCITON, LATICACIDVEN in the last 168 hours.  Recent Results (from the past 240 hour(s))  SARS CORONAVIRUS 2 (TAT 6-24 HRS) Nasopharyngeal Nasopharyngeal Swab     Status:  None   Collection Time: 09/25/19  5:00 PM   Specimen: Nasopharyngeal Swab  Result Value Ref Range Status   SARS Coronavirus 2 NEGATIVE NEGATIVE Final    Comment: (NOTE) SARS-CoV-2 target nucleic acids are NOT DETECTED. The SARS-CoV-2 RNA is generally detectable in upper and lower respiratory specimens during the acute phase of infection. Negative results do not preclude SARS-CoV-2 infection, do not rule out co-infections with other pathogens, and should not be used as the sole basis for treatment or other patient management decisions. Negative results must be combined with clinical observations, patient history, and epidemiological information. The expected result is Negative. Fact Sheet for Patients: SugarRoll.be Fact Sheet for Healthcare Providers: https://www.woods-mathews.com/ This test is not yet approved or cleared by the [place] FDA and  has been authorized for detection and/or diagnosis of SARS-CoV-2 by FDA under an Emergency Use Authorization (EUA). This EUA will remain  in effect (meaning this test can be used) for the duration of the COVID-19 declaration under Section 56 4(b)(1) of the Act, 21 U.S.C. section 360bbb-3(b)(1), unless the authorization is terminated or revoked sooner. Performed at [facility], [place] [address] [place], [place] [address]          Radiology Studies: No results found.      Scheduled Meds: . aspirin EC  81 mg Oral Daily  . atorvastatin  80 mg Oral QPM  . carvedilol  25 mg Oral BID  . clopidogrel  75 mg Oral q morning - 10a  . darbepoetin (ARANESP) injection - NON-DIALYSIS  60 mcg Subcutaneous Q Mon-1800  . furosemide  40 mg Oral Daily  . heparin injection (subcutaneous)  5,000 Units Subcutaneous Q8H  . insulin aspart  0-9 Units Subcutaneous TID WC  . levothyroxine  125 mcg Oral Q0600  . multivitamin  1 tablet Oral QHS  . nystatin   Topical TID  . saccharomyces boulardii  250  mg Oral BID  . sodium chloride flush  10-40 mL Intracatheter Q12H  . spironolactone  25 mg Oral Daily   Continuous Infusions: . sodium chloride       LOS: 20 days    Time spent: 15 minutes.     Harold Hedge, DO [facility] Pager [phone]  If 7PM-7AM, please contact night-coverage [email or web address] Password [email or web address] 09/28/2019, 8:03 AM

## 2019-09-29 LAB — CBC
HCT: 26.8 % — ABNORMAL LOW (ref 36.0–46.0)
Hemoglobin: 8 g/dL — ABNORMAL LOW (ref 12.0–15.0)
MCH: 27.8 pg (ref 26.0–34.0)
MCHC: 29.9 g/dL — ABNORMAL LOW (ref 30.0–36.0)
MCV: 93.1 fL (ref 80.0–100.0)
Platelets: 174 10*3/uL (ref 150–400)
RBC: 2.88 MIL/uL — ABNORMAL LOW (ref 3.87–5.11)
RDW: 16.5 % — ABNORMAL HIGH (ref 11.5–15.5)
WBC: 5.7 10*3/uL (ref 4.0–10.5)
nRBC: 0 % (ref 0.0–0.2)

## 2019-09-29 LAB — RENAL FUNCTION PANEL
Albumin: 2.3 g/dL — ABNORMAL LOW (ref 3.5–5.0)
Anion gap: 11 (ref 5–15)
BUN: 50 mg/dL — ABNORMAL HIGH (ref 8–23)
CO2: 20 mmol/L — ABNORMAL LOW (ref 22–32)
Calcium: 7.5 mg/dL — ABNORMAL LOW (ref 8.9–10.3)
Chloride: 107 mmol/L (ref 98–111)
Creatinine, Ser: 2.57 mg/dL — ABNORMAL HIGH (ref 0.44–1.00)
GFR calc Af Amer: 22 mL/min — ABNORMAL LOW (ref >60)
GFR calc non Af Amer: 19 mL/min — ABNORMAL LOW (ref >60)
Glucose, Bld: 160 mg/dL — ABNORMAL HIGH (ref 70–99)
Phosphorus: 5.2 mg/dL — ABNORMAL HIGH (ref 2.5–4.6)
Potassium: 5.3 mmol/L — ABNORMAL HIGH (ref 3.5–5.1)
Sodium: 138 mmol/L (ref 135–145)

## 2019-09-29 LAB — GLUCOSE, CAPILLARY
Glucose-Capillary: 136 mg/dL — ABNORMAL HIGH (ref 70–99)
Glucose-Capillary: 123 mg/dL — ABNORMAL HIGH (ref 70–99)
Glucose-Capillary: 142 mg/dL — ABNORMAL HIGH (ref 70–99)
Glucose-Capillary: 272 mg/dL — ABNORMAL HIGH (ref 70–99)

## 2019-09-29 LAB — SARS CORONAVIRUS 2 (TAT 6-24 HRS): SARS Coronavirus 2: NEGATIVE

## 2019-09-29 MED ORDER — DARBEPOETIN ALFA 100 MCG/0.5ML IJ SOSY
100.0000 ug | PREFILLED_SYRINGE | INTRAMUSCULAR | Status: DC
  Administered 2019-09-29: 100 ug via SUBCUTANEOUS
  Filled 2019-09-29 (×2): qty 0.5

## 2019-09-29 NOTE — Progress Notes (Signed)
Patient ID: Maureen Jackson, female   DOB: 1956/01/20, 63 y.o.   MRN: [id number]    13 Days Post-Op  Subjective: No complaints.  Tramadol has worked great with her dressing changes.    Objective: Vital signs in last 24 hours: Temp:  [97.5 F (36.4 C)-98.4 F (36.9 C)] 97.5 F (36.4 C) (10/19 0624) Pulse Rate:  [65-80] 65 (10/19 0624) Resp:  [16-18] 18 (10/19 0624) BP: (111-138)/(54-86) 138/54 (10/19 0624) SpO2:  [97 %-100 %] 100 % (10/19 0624) Weight:  [153.1 kg] 153.1 kg (10/19 0626) Last BM Date: 09/27/19  Intake/Output from previous day: 10/18 0701 - 10/19 0700 In: 560 [P.O.:560] Out: 200 [Urine:200] Intake/Output this shift: No intake/output data recorded.  PE: Skin: mons wound is 100% clean with beefy red granulation tissue.    Lab Results:  Recent Labs    09/29/19 0405  WBC 5.7  HGB 8.0*  HCT 26.8*  PLT 174   BMET Recent Labs    09/28/19 1012 09/29/19 0405  NA 139 138  K 4.6 5.3*  CL 108 107  CO2 21* 20*  GLUCOSE 192* 160*  BUN 47* 50*  CREATININE 2.21* 2.57*  CALCIUM 7.3* 7.5*   PT/INR No results for input(s): LABPROT, INR in the last 72 hours. CMP     Component Value Date/Time   NA 138 09/29/2019 0405   K 5.3 (H) 09/29/2019 0405   CL 107 09/29/2019 0405   CO2 20 (L) 09/29/2019 0405   GLUCOSE 160 (H) 09/29/2019 0405   BUN 50 (H) 09/29/2019 0405   CREATININE 2.57 (H) 09/29/2019 0405   CALCIUM 7.5 (L) 09/29/2019 0405   PROT 6.2 (L) 09/11/2019 0813   ALBUMIN 2.3 (L) 09/29/2019 0405   AST 73 (H) 09/11/2019 0813   ALT 42 09/11/2019 0813   ALKPHOS 166 (H) 09/11/2019 0813   BILITOT 1.0 09/11/2019 0813   GFRNONAA 19 (L) 09/29/2019 0405   GFRAA 22 (L) 09/29/2019 0405   Lipase  No results found for: LIPASE     Studies/Results: No results found.  Anti-infectives: Anti-infectives (From admission, onward)   Start     Dose/Rate Route Frequency Ordered Stop   09/19/19 1600  levofloxacin (LEVAQUIN) tablet 500 mg     500 mg Oral Every 48  hours 09/18/19 1118 09/25/19 1814   09/17/19 1600  levofloxacin (LEVAQUIN) tablet 750 mg  Status:  Discontinued     750 mg Oral Every 48 hours 09/17/19 1100 09/18/19 1118   09/17/19 1600  doxycycline (VIBRA-TABS) tablet 100 mg     100 mg Oral Every 12 hours 09/17/19 1100 09/26/19 2359   09/16/19 1600  linezolid (ZYVOX) IVPB 600 mg  Status:  Discontinued     600 mg 300 mL/hr over 60 Minutes Intravenous Every 12 hours 09/16/19 1556 09/17/19 1100   09/12/19 1100  linezolid (ZYVOX) IVPB 600 mg  Status:  Discontinued     600 mg 300 mL/hr over 60 Minutes Intravenous Every 12 hours 09/12/19 0945 09/16/19 1556   09/12/19 1100  Ampicillin-Sulbactam (UNASYN) 3 g in sodium chloride 0.9 % 100 mL IVPB  Status:  Discontinued     3 g 200 mL/hr over 30 Minutes Intravenous Every 12 hours 09/12/19 0945 09/17/19 1100   09/10/19 1430  clindamycin (CLEOCIN) IVPB 600 mg  Status:  Discontinued     600 mg 100 mL/hr over 30 Minutes Intravenous Every 6 hours 09/10/19 1410 09/12/19 0945   09/10/19 0742  vancomycin variable dose per unstable renal function (pharmacist dosing)  Status:  Discontinued      Does not apply See admin instructions 09/10/19 0742 09/10/19 1413   09/10/19 0000  vancomycin (VANCOCIN) 1,750 mg in sodium chloride 0.9 % 500 mL IVPB  Status:  Discontinued     1,750 mg 250 mL/hr over 120 Minutes Intravenous Every 24 hours 09/09/19 0912 09/09/19 0928   09/10/19 0000  vancomycin (VANCOCIN) 1,750 mg in sodium chloride 0.9 % 500 mL IVPB  Status:  Discontinued     1,750 mg 250 mL/hr over 120 Minutes Intravenous Every 24 hours 09/09/19 0940 09/10/19 0751   09/09/19 1030  vancomycin (VANCOCIN) IVPB 750 mg/150 ml premix  Status:  Discontinued     750 mg 150 mL/hr over 60 Minutes Intravenous  Once 09/09/19 0928 09/09/19 0938   09/09/19 0930  vancomycin (VANCOCIN) IVPB 1000 mg/200 mL premix  Status:  Discontinued     1,000 mg 200 mL/hr over 60 Minutes Intravenous  Once 09/09/19 0928 09/09/19 0938    09/08/19 2300  azithromycin (ZITHROMAX) 500 mg in sodium chloride 0.9 % 250 mL IVPB  Status:  Discontinued     500 mg 250 mL/hr over 60 Minutes Intravenous Every 24 hours 09/08/19 2230 09/12/19 0945   09/08/19 2300  cefTRIAXone (ROCEPHIN) 2 g in sodium chloride 0.9 % 100 mL IVPB  Status:  Discontinued     2 g 200 mL/hr over 30 Minutes Intravenous Every 24 hours 09/08/19 2230 09/12/19 0945   09/08/19 2300  vancomycin (VANCOCIN) 1,500 mg in sodium chloride 0.9 % 500 mL IVPB     1,500 mg 250 mL/hr over 120 Minutes Intravenous  Once 09/08/19 2254 09/09/19 0434   09/08/19 1600  vancomycin (VANCOCIN) IVPB 1000 mg/200 mL premix     1,000 mg 200 mL/hr over 60 Minutes Intravenous  Once 09/08/19 1558 09/08/19 1929   09/08/19 1600  piperacillin-tazobactam (ZOSYN) IVPB 3.375 g     3.375 g 100 mL/hr over 30 Minutes Intravenous  Once 09/08/19 1558 09/08/19 1655       Assessment/Plan Morbid obesity BMI 60.13 DM CAD with cardiac stents in place on plavix Hx ischemic cardiomyopathy Acute on chronic systolic heart failure EF 30-35% Anemia Multifactorial encephalopathy Hypothyroidism HLD AKI on CKD  - Creatinine 2.57   POD 13, s/p incision and debridment of mons soft tissue infection and spontaneously draining left labial abscess, 10/6 Dr. Ninfa Linden -cont WD dressing changes to this wound. The wound is 100% clean.  She is stable for DC from our standpoint.  Follow up will be arranged in our office in 3-4 weeks for a wound check.  -we will sign off at this time.  ID:  Vancomycin 9/28x 2 doses;Rocephin 9/28-10/2, azithromycin 9/28-10/2, clindamycin 9/30-10/2. Currently on Zyvox and Unasyn 10/2-10/7; Doxycycline 10/7>>completed VTE -SCDs, plavix, heparin FEN -CM Follow up -Dr. Ninfa Linden in 3-4 weeks   LOS: 21 days    Henreitta Cea , [facility] 09/29/2019, 7:50 AM Please see Amion for pager number during day hours 7:00am-4:30pm

## 2019-09-29 NOTE — Progress Notes (Signed)
RN came to bladder scan patient but she wanted to use the [facility] to urinate, patient put on [facility], she did urinate and had a BM as well.  Could not measure because it was mixed with BM. Bladder scan done post was zero.

## 2019-09-29 NOTE — TOC Initial Note (Addendum)
Transition of Care [facility]) - Initial/Assessment Note    Patient Details  Name: Maureen Jackson MRN: [id number] Date of Birth: 07/31/1956  Transition of Care [facility]) CM/SW Contact:    Maureen Mayo, RN Phone Number: 09/29/2019, 4:08 PM  Clinical Narrative:    left labial abcess, CAP, Lives at home with her daughter, Ambulates some with a cane, nephro consulted. Transfer from [facility] on 9/30. Transferred to [facility] on 10/5 from [facility]- AKI, for tunneled HD cath on 10/6 and initiation of dialysis, lethargic.               From home with daughter, NCM spoke with patient and she is agreeable to SNF work up, she gives this NCM permission to fax her information out to the SNF's in [place].  10/9 Maureen Bamberger RN, BSN - patient had bed offer from [facility], she states she does not want to go to [facility] is not accepting patients because they have covid patients. MD notified  10/13 Maureen Bamberger RN, BSN- NCM spoke with patient , she is interested in CIR, NCM informed MD, order in for CIR consult. NCM asked patient what is her other option if this is not approved by her insurance, she states she would like to go home, she does not want to go to SNF. She states she had [facility] in the past.   10/14 Maureen Bamberger RN, BSN- CIR consult, daughter to make sure patient has wound care if she has a CIR stay per Maureen Jackson with CIR. Daughter is Maureen Jackson [phone].  10/15 Maureen Bamberger RN, BSN- Maureen Jackson with CIR states they rec SNF for patient with wound care. NCM spoke with patient and daughter.  Daughter states her mother needs to go to SNF because she can not do the wound care, patient does not have any one at home to do wound care that needs packing.  NCM did send out to other snfs, did get an offer from [facility], but patient still states she does not want to go there.  NCM just received offer from [facility] for patient, patient states she will let NCM know in  the morning.   10/16 Maureen Bamberger RN, BSN- NCM spoke with patient and her sister , Maureen Jackson at the bedside.  She states they would like to go to [facility] on [address] informed Maureen Jackson , she states they will not have an opening til maybe Wed.  Maureen Jackson states to start auth for that one , she does not want to go to the one on [address].  Maureen Jackson with [facility] informed.  10/17-NCM spoke with daughter Maureen Jackson ,informed her left paper work for [facility] in patient room for her to come and fill out and then give back to NCM to fax to [facility]   10/19- Daughter will take paper work to [facility] on 10/20, plan is for dc to [facility] on Wednesday.   Expected Discharge Plan: Skilled Nursing Facility Barriers to Discharge: No Barriers Identified   Patient Goals and CMS Choice Patient states their goals for this hospitalization and ongoing recovery are:: get better CMS Medicare.gov Compare Post Acute Care list provided to:: Patient Represenative (must comment)(daughter and sister) Choice offered to / list presented to : Sibling, Adult Children  Expected Discharge Plan and Services Expected Discharge Plan: [place] In-house Referral: NA Discharge Planning Services: CM Consult Post Acute Care Choice: [place] Living arrangements for the past 2 months: Single  Family Home                 DME Arranged: (NA)         HH Arranged: NA          Prior Living Arrangements/Services Living arrangements for the past 2 months: Single Family Home Lives with:: Adult Children Patient language and need for interpreter reviewed:: Yes Do you feel safe going back to the place where you live?: Yes      Need for Family Participation in Patient Care: Yes (Comment) Care giver support system in place?: Yes (comment) Current home services: DME(uses a cane at home) Criminal Activity/Legal Involvement Pertinent to Current Situation/Hospitalization: No - Comment as  needed  Activities of Daily Living Home Assistive Devices/Equipment: Eyeglasses ADL Screening (condition at time of admission) Patient's cognitive ability adequate to safely complete daily activities?: Yes Is the patient deaf or have difficulty hearing?: No Does the patient have difficulty seeing, even when wearing glasses/contacts?: No Does the patient have difficulty concentrating, remembering, or making decisions?: No Patient able to express need for assistance with ADLs?: Yes Does the patient have difficulty dressing or bathing?: No Independently performs ADLs?: Yes (appropriate for developmental age) Does the patient have difficulty walking or climbing stairs?: Yes Weakness of Legs: Both Weakness of Arms/Hands: None  Permission Sought/Granted Permission sought to share information with : Case Manager, Customer service manager, Family Supports Permission granted to share information with : Yes, Verbal Permission Granted  Share Information with NAME: Maureen Jackson (daughter) and Maureen Jackson (sister)  Permission granted to share info w AGENCY: SNF's  Permission granted to share info w Relationship: Maureen Jackson (daughter) Maureen Jackson (sister)  Permission granted to share info w Sport and exercise psychologist Information: Maureen Jackson [phone] ,Maureen Jackson [phone]  Emotional Assessment Appearance:: Appears stated age Attitude/Demeanor/Rapport: Gracious Affect (typically observed): Appropriate Orientation: : Oriented to Self, Oriented to Place, Oriented to  Time, Oriented to Situation Alcohol / Substance Use: Not Applicable Psych Involvement: No (comment)  Admission diagnosis:  Abscess [L02.91] Community acquired pneumonia of left lower lobe of lung [J18.9] Patient Active Problem List   Diagnosis Date Noted  . Pressure injury of skin 09/21/2019  . Acute renal failure (ARF) ([place])   . AMS (altered mental status)   . Drug rash   . Abscess   . Fistula   . Labial abscess   . Fournier's gangrene in female   . CAP  (community acquired pneumonia) 09/08/2019  . Chest pain 07/16/2018  . Hyperlipidemia 04/10/2018  . TIA (transient ischemic attack) 03/14/2018  . Vertigo 03/14/2018  . Chronic combined systolic and diastolic CHF (congestive heart failure) (EF 30 to 35 %) 03/14/2018  . Ischemic cardiomyopathy 11/06/2017  . CAD, multiple vessel   . Pericardial effusion 09/07/2017  . AKI (acute kidney injury) ([place]) 09/07/2017  . Elevated troponin I level 09/07/2017  . Acute combined systolic (congestive) and diastolic (congestive) heart failure ([place]) 09/06/2017  . Cardiomegaly 09/05/2017  . Morbid obesity/BMI > 55 03/04/2013  . Labyrinthitis 03/04/2013  . Diabetes mellitus type 2 with complications, uncontrolled ([place]) 03/04/2013  . GASTRITIS 12/13/2006  . HIATAL HERNIA, HX OF 12/13/2006  . THYROIDECTOMY, HX OF 12/13/2006  . Hypothyroidism 10/04/2006  . TOBACCO ABUSE 10/04/2006  . CARPAL TUNNEL SYNDROME 10/04/2006  . Essential hypertension 10/04/2006  . GERD 10/04/2006  . POSTMENOPAUSAL STATUS 10/04/2006  . SKIN TAG 10/04/2006  . KNEE PAIN, LEFT 10/04/2006  . Sleep apnea 10/04/2006  . LEG EDEMA 10/04/2006   PCP:  Neale Burly, MD  Pharmacy:   [facility] [place], [place] [address] Phone: [phone] Fax: [phone]  [facility] [place], [place] [address] Phone: [phone] Fax: [phone]     Social Determinants of Health (SDOH) Interventions    Readmission Risk Interventions Readmission Risk Prevention Plan 09/18/2019  Transportation Screening Complete  HRI or Home Care Consult Complete  Social Work Consult for [place] Planning/Counseling Complete  Palliative Care Screening Not Applicable  Medication Review Press photographer) Complete  Some recent data might  be hidden

## 2019-09-29 NOTE — Discharge Instructions (Signed)
Normal saline wet to dry dressing change to wound ddaily

## 2019-09-29 NOTE — Progress Notes (Signed)
RN went to patient room to give report to another RN and swab patient for covid, RN explained procedure to patient  and what to expect,and asked about nares preferences, per patient it didn't matter, patient sister was present. RN started to insert swab into nares and patient tighten her face, RN asked patient to relax so she could insert swab Futher, patient did and RN began to insert swab further and patient startled and sister jumped out of her chair and said RN push the swab too far back in her nose, RN tried to explained to patient sister that was an expected reaction but per sister, No. Per sister where she work, she does covid test every Thursday and she never gets that reaction, she said RN did not know what she was doing, and I hurt her sister. The two RN tried to explained but patient began verbally aggressive and asked to speak to supervisor, RN walked out of room and sister follow her to the nursing station yelling, Surveyor, quantity to sister back to room, still yelling in room that she's going to sue the hospital and called the State because we hurt her sister. She won't calmed down to listen, so security was called to escort her out. "AS she walked out she verbally threaten staff, she said if she catchers [place] outside she would whipped our a$$es". Patient is safe, and appolize to this Probation officer for the incident.

## 2019-09-29 NOTE — Progress Notes (Signed)
PROGRESS NOTE    Maureen Jackson  VHQ:[id number] DOB: [birth year] DOA: 09/08/2019 PCP: Neale Burly, MD   Brief Narrative: 63 year old with past medical history significant for morbid obesity, systolic heart failure, hypothyroidism, hypertension, CAD and obstructive sleep apnea.  Presents with  generalized weakness, difficulty voiding, poor appetite and insomnia for 3 days.  September 26,  2 days prior to hospitalization she was seen in the emergency department for left labia upper thigh abscess which was drained.  Discharge home on oral antibiotics.  Patient had worsening renal function in the setting of concern for Fournier gangrene, patient was transferred from [facility]  to [facility] for further management. Repeat CT scan done on 10/5 findings worrisome for Fournier gangrene: Patient was taken to the OR by general surgery on 10/6. Was also noted to be in an acute HF exacerbation. Nephrology was consulted, patient was a started on hemodialysis on 10/6 and had HD on 10-08.   Nephro signed off 10/13 with improvement in urine output and volume status for patient and recurrence of AKI and this was discussed with nephro with continuation of Lasix, spironolactone bladder scan/straight cath protocol.  Patient currently awaiting placement to SNF, likely Wednesday. Transferred to med-surg bed 10/19.     Assessment & Plan:   Principal Problem:   Fournier's gangrene in female Active Problems:   Hypothyroidism   Essential hypertension   Morbid obesity/BMI > 55   Diabetes mellitus type 2 with complications, uncontrolled (HCC)   CAD, multiple vessel   Ischemic cardiomyopathy   Chronic combined systolic and diastolic CHF (congestive heart failure) (EF 30 to 35 %)   CAP (community acquired pneumonia)   Labial abscess   Abscess   Fistula   AMS (altered mental status)   Drug rash   Acute renal failure (ARF) (HCC)   Pressure injury of skin  AKI; overall has improved following HD treatments however  now creatinine above increasing again. Back on spironolactone and Lasix. -Continue holding home Entresto until renal function improves -Hold today's Spironolactone with Hyperkalemia -continue PO lasix -Nephro on board -Monitor renal function -Will need follow-up outpatient with nephro  Hyperkalemia asymptomatic. K 5.3 today - Hold spironolactone  Labial abscess/Fournier gangrene complicated with sepsis/acute metabolic encephalopathy Patient presented with leukocytosis, fever post sediment in [place]. ID was consulted and had been helping with antibiotics, currently signed off, antibiotics had been changed from Zyvox, Unasyn  to Levaquin and Doxycycline in light of developing rash.  Completed 10 days antibiotics. -Surgery recommending wet-to-dry dressing twice a day   Nausea resolved  Rash/dry skin;Stable Benadryl cream and oral benadryl PRN.  Unasyn and Zyvox discontinued.,  Completed antibiotics as above.  Can use moisturizer cream to back prn  Acute on chronic systolic heart failure exacerbation, resolved. 58 kg (10/13)-> 186 kg (unlikely accurate).  On home carvedilol and spironolactone then home Lasix -Continue aspirin/ plavix.   -Continue holding Entresto  -Hold Spironolactone  Diabetes: Continue with sliding scale insulin  Morbid obesity with OSA: Continue with CPAP Body mass index 60  Hypothyroidism Continue with Synthroid.  Acute metabolic encephalopathy: Resolved.  Related to acute illness renal failure sepsis infection.     Nutrition Problem: Inadequate oral intake Etiology: acute illness(pneumonia; abscess in left labia/upper thigh area)    Signs/Symptoms: meal completion < 25%    Interventions: MVI, Magic cup  Estimated body mass index is 56.18 kg/m as calculated from the following:   Height as of this encounter: 5\' 5"  (1.651 m).   Weight as  of this encounter: 153.1 kg.   DVT prophylaxis: Heparin Code Status: Full code Disposition Plan: Transfer to  med-surg floor. Awaiting placement to SNF, likely Wednesday discharge pending COVID swab and renal function improvement.  Consultants:   surgery  ID  Nephrology  Procedures:   Irrigation and debridement months abscess 6 x 6 by day 10/6  Had HD on 10/6 and 10-08  Antimicrobials:  All antibiotics completed Levaquin and doxy Received Zyvox, Unasyn  Subjective:  Patient lying flat and states she feels better than yesterday. Denies any nausea. Nurse at bedside states patient had urinated about 200 cc this morning but was unable to get accurate urine measurement due to BM at same time today. Denies any complaints at this time.     Objective: Vitals:   09/28/19 1957 09/29/19 0020 09/29/19 0624 09/29/19 0626  BP: (!) 111/57 125/67 (!) 138/54   Pulse: 68 67 65   Resp: 18 17 18    Temp: 97.6 F (36.4 C) 97.8 F (36.6 C) (!) 97.5 F (36.4 C)   TempSrc: Oral Oral Oral   SpO2: 98% 100% 100%   Weight:    (!) 153.1 kg  Height:        Intake/Output Summary (Last 24 hours) at 09/29/2019 0948 Last data filed at 09/29/2019 0981 Gross per 24 hour  Intake 340 ml  Output 200 ml  Net 140 ml   Filed Weights   09/25/19 0529 09/27/19 0654 09/29/19 0626  Weight: (!) 186.8 kg (!) 155.3 kg (!) 153.1 kg    Examination: Physical Exam Vitals signs and nursing note reviewed.  Constitutional:      Appearance: Normal appearance.  HENT:     Head: Normocephalic and atraumatic.     Mouth/Throat:     Mouth: Mucous membranes are moist.  Eyes:     Extraocular Movements: Extraocular movements intact.     Conjunctiva/sclera: Conjunctivae normal.  Neck:     Musculoskeletal: Normal range of motion. No neck rigidity.  Cardiovascular:     Rate and Rhythm: Normal rate and regular rhythm.  Pulmonary:     Effort: Pulmonary effort is normal.     Breath sounds: Normal breath sounds.     Comments: No orthopnea Abdominal:     General: Abdomen is flat.     Palpations: Abdomen is soft.   Musculoskeletal: Normal range of motion.        General: No swelling.  Neurological:     General: No focal deficit present.     Mental Status: She is alert. Mental status is at baseline.  Psychiatric:        Mood and Affect: Mood normal.        Behavior: Behavior normal.        Data Reviewed: I have personally reviewed following labs and imaging studies  CBC: Recent Labs  Lab 09/23/19 0416 09/24/19 0546 09/25/19 0702 09/26/19 0424 09/29/19 0405  WBC 8.9 7.0 7.5 7.2 5.7  HGB 8.4* 8.1* 8.3* 8.3* 8.0*  HCT 27.0* 26.8* 27.7* 26.7* 26.8*  MCV 90.3 90.5 92.0 91.8 93.1  PLT 190 198 207 186 191   Basic Metabolic Panel: Recent Labs  Lab 09/25/19 0702 09/26/19 0424 09/27/19 0904 09/28/19 1012 09/29/19 0405  NA 142 140 138 139 138  K 3.9 4.1 4.2 4.6 5.3*  CL 110 109 108 108 107  CO2 21* 20* 19* 21* 20*  GLUCOSE 163* 217* 221* 192* 160*  BUN 36* 40* 45* 47* 50*  CREATININE 2.04* 2.55* 2.39* 2.21* 2.57*  CALCIUM 7.4* 7.4* 7.3* 7.3* 7.5*  PHOS 2.8 3.4 4.0 4.5 5.2*   GFR: Estimated Creatinine Clearance: 34.6 mL/min (A) (by C-G formula based on SCr of 2.57 mg/dL (H)). Liver Function Tests: Recent Labs  Lab 09/25/19 0702 09/26/19 0424 09/27/19 0904 09/28/19 1012 09/29/19 0405  ALBUMIN 2.0* 2.1* 2.2* 2.2* 2.3*   No results for input(s): LIPASE, AMYLASE in the last 168 hours. No results for input(s): AMMONIA in the last 168 hours. Coagulation Profile: No results for input(s): INR, PROTIME in the last 168 hours. Cardiac Enzymes: No results for input(s): CKTOTAL, CKMB, CKMBINDEX, TROPONINI in the last 168 hours. BNP (last 3 results) No results for input(s): PROBNP in the last 8760 hours. HbA1C: No results for input(s): HGBA1C in the last 72 hours. CBG: Recent Labs  Lab 09/28/19 0726 09/28/19 1129 09/28/19 1617 09/28/19 2127 09/29/19 0656  GLUCAP 169* 188* 174* 196* 136*   Lipid Profile: No results for input(s): CHOL, HDL, LDLCALC, TRIG, CHOLHDL, LDLDIRECT  in the last 72 hours. Thyroid Function Tests: No results for input(s): TSH, T4TOTAL, FREET4, T3FREE, THYROIDAB in the last 72 hours. Anemia Panel: No results for input(s): VITAMINB12, FOLATE, FERRITIN, TIBC, IRON, RETICCTPCT in the last 72 hours. Sepsis Labs: No results for input(s): PROCALCITON, LATICACIDVEN in the last 168 hours.  Recent Results (from the past 240 hour(s))  SARS CORONAVIRUS 2 (TAT 6-24 HRS) Nasopharyngeal Nasopharyngeal Swab     Status: None   Collection Time: 09/25/19  5:00 PM   Specimen: Nasopharyngeal Swab  Result Value Ref Range Status   SARS Coronavirus 2 NEGATIVE NEGATIVE Final    Comment: (NOTE) SARS-CoV-2 target nucleic acids are NOT DETECTED. The SARS-CoV-2 RNA is generally detectable in upper and lower respiratory specimens during the acute phase of infection. Negative results do not preclude SARS-CoV-2 infection, do not rule out co-infections with other pathogens, and should not be used as the sole basis for treatment or other patient management decisions. Negative results must be combined with clinical observations, patient history, and epidemiological information. The expected result is Negative. Fact Sheet for Patients: SugarRoll.be Fact Sheet for Healthcare Providers: https://www.woods-mathews.com/ This test is not yet approved or cleared by the [place] FDA and  has been authorized for detection and/or diagnosis of SARS-CoV-2 by FDA under an Emergency Use Authorization (EUA). This EUA will remain  in effect (meaning this test can be used) for the duration of the COVID-19 declaration under Section 56 4(b)(1) of the Act, 21 U.S.C. section 360bbb-3(b)(1), unless the authorization is terminated or revoked sooner. Performed at [facility], [place] [address], [place], [place] [address]          Radiology Studies: No results found.      Scheduled Meds: . aspirin EC  81 mg Oral Daily  .  atorvastatin  80 mg Oral QPM  . carvedilol  25 mg Oral BID  . clopidogrel  75 mg Oral q morning - 10a  . darbepoetin (ARANESP) injection - NON-DIALYSIS  100 mcg Subcutaneous Q Mon-1800  . furosemide  40 mg Oral Daily  . heparin injection (subcutaneous)  5,000 Units Subcutaneous Q8H  . insulin aspart  0-9 Units Subcutaneous TID WC  . levothyroxine  125 mcg Oral Q0600  . multivitamin  1 tablet Oral QHS  . nystatin   Topical TID  . saccharomyces boulardii  250 mg Oral BID  . sodium chloride flush  10-40 mL Intracatheter Q12H  . spironolactone  25 mg Oral Daily   Continuous Infusions: . sodium  chloride       LOS: 21 days    Time spent: 20 minutes.     Harold Hedge, DO [facility] Pager [phone]  If 7PM-7AM, please contact night-coverage [email or web address] Password [email or web address] 09/29/2019, 9:48 AM

## 2019-09-29 NOTE — Progress Notes (Signed)
Patient ID: Maureen Jackson, female   DOB: 03-28-1956, 63 y.o.   MRN: [id number] S: Feels well, no complaints.  Says is continent and can tell when she needs to urinate and able to control.  Not much UOP recorded.  crt trended in wrong direction again from yest to today    O:BP (!) 138/54 (BP Location: Left Arm)   Pulse 65   Temp (!) 97.5 F (36.4 C) (Oral)   Resp 18   Ht 5\' 5"  (1.651 m)   Wt (!) 153.1 kg   SpO2 100%   BMI 56.18 kg/m   Intake/Output Summary (Last 24 hours) at 09/29/2019 0809 Last data filed at 09/28/2019 1700 Gross per 24 hour  Intake 560 ml  Output 200 ml  Net 360 ml   Intake/Output: I/O last 3 completed shifts: In: 920 [P.O.:920] Out: 350 [Urine:350]  Intake/Output this shift:  No intake/output data recorded. Weight change:  Gen: obese AAF in NAD  CVS: no rub Resp: cta Abd: obese, +BS, soft, mildly tender Ext: trace to 1+ pretibial edema  Recent Labs  Lab 09/23/19 0416 09/24/19 0546 09/25/19 0702 09/26/19 0424 09/27/19 0904 09/28/19 1012 09/29/19 0405  NA 143 144 142 140 138 139 138  K 3.7 3.8 3.9 4.1 4.2 4.6 5.3*  CL 109 112* 110 109 108 108 107  CO2 23 22 21* 20* 19* 21* 20*  GLUCOSE 106* 134* 163* 217* 221* 192* 160*  BUN 50* 39* 36* 40* 45* 47* 50*  CREATININE 2.34* 2.15* 2.04* 2.55* 2.39* 2.21* 2.57*  ALBUMIN 1.9* 1.9* 2.0* 2.1* 2.2* 2.2* 2.3*  CALCIUM 7.6* 7.5* 7.4* 7.4* 7.3* 7.3* 7.5*  PHOS 3.2 2.9 2.8 3.4 4.0 4.5 5.2*   Liver Function Tests: Recent Labs  Lab 09/27/19 0904 09/28/19 1012 09/29/19 0405  ALBUMIN 2.2* 2.2* 2.3*   No results for input(s): LIPASE, AMYLASE in the last 168 hours. No results for input(s): AMMONIA in the last 168 hours. CBC: Recent Labs  Lab 09/23/19 0416 09/24/19 0546 09/25/19 0702 09/26/19 0424 09/29/19 0405  WBC 8.9 7.0 7.5 7.2 5.7  HGB 8.4* 8.1* 8.3* 8.3* 8.0*  HCT 27.0* 26.8* 27.7* 26.7* 26.8*  MCV 90.3 90.5 92.0 91.8 93.1  PLT 190 198 207 186 174   Cardiac Enzymes: No results for  input(s): CKTOTAL, CKMB, CKMBINDEX, TROPONINI in the last 168 hours. CBG: Recent Labs  Lab 09/28/19 0726 09/28/19 1129 09/28/19 1617 09/28/19 2127 09/29/19 0656  GLUCAP 169* 188* 174* 196* 136*    Iron Studies: No results for input(s): IRON, TIBC, TRANSFERRIN, FERRITIN in the last 72 hours. Studies/Results: No results found. Marland Kitchen aspirin EC  81 mg Oral Daily  . atorvastatin  80 mg Oral QPM  . carvedilol  25 mg Oral BID  . clopidogrel  75 mg Oral q morning - 10a  . darbepoetin (ARANESP) injection - NON-DIALYSIS  60 mcg Subcutaneous Q Mon-1800  . furosemide  40 mg Oral Daily  . heparin injection (subcutaneous)  5,000 Units Subcutaneous Q8H  . insulin aspart  0-9 Units Subcutaneous TID WC  . levothyroxine  125 mcg Oral Q0600  . multivitamin  1 tablet Oral QHS  . nystatin   Topical TID  . saccharomyces boulardii  250 mg Oral BID  . sodium chloride flush  10-40 mL Intracatheter Q12H  . spironolactone  25 mg Oral Daily    BMET    Component Value Date/Time   NA 138 09/29/2019 0405   K 5.3 (H) 09/29/2019 0405   CL 107  09/29/2019 0405   CO2 20 (L) 09/29/2019 0405   GLUCOSE 160 (H) 09/29/2019 0405   BUN 50 (H) 09/29/2019 0405   CREATININE 2.57 (H) 09/29/2019 0405   CALCIUM 7.5 (L) 09/29/2019 0405   GFRNONAA 19 (L) 09/29/2019 0405   GFRAA 22 (L) 09/29/2019 0405   CBC    Component Value Date/Time   WBC 5.7 09/29/2019 0405   RBC 2.88 (L) 09/29/2019 0405   HGB 8.0 (L) 09/29/2019 0405   HCT 26.8 (L) 09/29/2019 0405   PLT 174 09/29/2019 0405   MCV 93.1 09/29/2019 0405   MCH 27.8 09/29/2019 0405   MCHC 29.9 (L) 09/29/2019 0405   RDW 16.5 (H) 09/29/2019 0405   LYMPHSABS 2.2 09/17/2019 0947   MONOABS 0.8 09/17/2019 0947   EOSABS 0.6 (H) 09/17/2019 0947   BASOSABS 0.0 09/17/2019 0947    Assessment/Plan:  1. AKI- presumably due to combination of ischemic ATN as well as vancomycin nephrotoxicity. She underwent HD on 10/6 and again on 09/18/19 with nontunneled HD  catheterwhich was removed 09/23/19. Since then, her UOP has improved as well as her BUN/Crwe signed off but then with bump in Crt- seemingly resolved but bumped again today for unclear reasons  1. do bladder scan  If pvr >300 ml would perform I&O cath.Will also check U/A 2. Creatinine improved but nowe worsened , poor documented UOP.   Cont to follow renal function daily-  Would be poor form to send patient out of hospital with rising crt 3. Agree with holding entresto but ok to resume po lasix  4. Will need f/u with our office after discharge 2. Left labial abscess with sepsis- improving with antibiotics and wound care/drainage. 3. AMS- improving. Multifactorial with sepsis and AKI/meds, now resolved. 4. Anemia- due to acute/critical illness. Aranesp started on 09/15/19- will inc dose and s/p feraheme. Hgb stable but poor . Transfuse prn.  [name]  [employer] [phone]

## 2019-09-29 NOTE — Progress Notes (Signed)
Physical Therapy Treatment Patient Details Name: Maureen Jackson MRN: [id number] DOB: 12-Nov-1956 Today's Date: 09/29/2019    History of Present Illness Pt is a 63 y.o. female admitted 09/08/19 with weakness, difficulty voiding and poor appetite. Had recent I&D of left labia upper thigh abscess in ED on 9/26. Pt with worsening renal function. CT 10/5 with concern for Fournier gangrene. S/p RIJ non-tunneled cath placement. S/p I&D of labia abscess 10/6. PMH includes morbid obesity, HF, HTN, CAD, OSA.    PT Comments    Patient seen for mobility progression. Pt is making progress toward PT goals and tolerated increased activity this session. Pt with c/o pain in L thigh while mobilizing and with antalgic gait pattern. Continue to progress as tolerated.     Follow Up Recommendations  CIR     Equipment Recommendations  Wheelchair (measurements PT);Wheelchair cushion (measurements PT);Rolling walker with 5" wheels(bariatric)    Recommendations for Other Services Rehab consult     Precautions / Restrictions Precautions Precautions: Fall Precaution Comments: R IJ HD catheter, abscess L thigh & labia Restrictions Weight Bearing Restrictions: No    Mobility  Bed Mobility Overal bed mobility: Needs Assistance Bed Mobility: Supine to Sit     Supine to sit: Min guard     General bed mobility comments: min guard for safety; use of rail and increased time/effort needed  Transfers Overall transfer level: Needs assistance Equipment used: Rolling walker (2 wheeled)(bariatric) Transfers: Sit to/from Stand Sit to Stand: Min assist;From elevated surface         General transfer comment: assist to power up into standing; cues for safe hand placement  Ambulation/Gait Ambulation/Gait assistance: Min guard;+2 safety/equipment(chair follow) Gait Distance (Feet): 100 Feet Assistive device: Rolling walker (2 wheeled) Gait Pattern/deviations: Antalgic;Step-through pattern;Decreased stride  length;Decreased weight shift to left;Wide base of support Gait velocity: decreased   General Gait Details: pt with antalgic gait due to L thigh pain; min guard for safety; pt with SOB while ambulating   Stairs             Wheelchair Mobility    Modified Rankin (Stroke Patients Only)       Balance Overall balance assessment: Needs assistance Sitting-balance support: No upper extremity supported;Feet supported Sitting balance-Leahy Scale: Fair     Standing balance support: Bilateral upper extremity supported Standing balance-Leahy Scale: Poor                              Cognition Arousal/Alertness: Awake/alert Behavior During Therapy: WFL for tasks assessed/performed Overall Cognitive Status: Within Functional Limits for tasks assessed                                        Exercises      General Comments        Pertinent Vitals/Pain Pain Assessment: Faces Faces Pain Scale: Hurts little more Pain Location: L thigh Pain Descriptors / Indicators: Discomfort;Guarding Pain Intervention(s): Limited activity within patient's tolerance;Monitored during session;Repositioned    Home Living                      Prior Function            PT Goals (current goals can now be found in the care plan section) Progress towards PT goals: Progressing toward goals    Frequency    Min 3X/week  PT Plan Current plan remains appropriate    Co-evaluation              AM-PAC PT "6 Clicks" Mobility   Outcome Measure  Help needed turning from your back to your side while in a flat bed without using bedrails?: A Little Help needed moving from lying on your back to sitting on the side of a flat bed without using bedrails?: A Little Help needed moving to and from a bed to a chair (including a wheelchair)?: A Little Help needed standing up from a chair using your arms (e.g., wheelchair or bedside chair)?: A Little Help  needed to walk in hospital room?: A Little Help needed climbing 3-5 steps with a railing? : Total 6 Click Score: 16    End of Session Equipment Utilized During Treatment: Gait belt Activity Tolerance: Patient tolerated treatment well Patient left: in chair;with call bell/phone within reach;with family/visitor present Nurse Communication: Mobility status PT Visit Diagnosis: Other abnormalities of gait and mobility (R26.89);Muscle weakness (generalized) (M62.81) Pain - Right/Left: Right Pain - part of body: Leg     Time: 3567-0141 PT Time Calculation (min) (ACUTE ONLY): 21 min  Charges:  $Gait Training: 8-22 mins                     Earney Navy, PTA Acute Rehabilitation Services Pager: [phone] Office: [phone]     Darliss Cheney 09/29/2019, 1:13 PM

## 2019-09-29 NOTE — Progress Notes (Signed)
Dressing change done, patient tolerated well.

## 2019-09-30 LAB — RENAL FUNCTION PANEL
Albumin: 2.4 g/dL — ABNORMAL LOW (ref 3.5–5.0)
Anion gap: 10 (ref 5–15)
BUN: 56 mg/dL — ABNORMAL HIGH (ref 8–23)
CO2: 20 mmol/L — ABNORMAL LOW (ref 22–32)
Calcium: 7.7 mg/dL — ABNORMAL LOW (ref 8.9–10.3)
Chloride: 107 mmol/L (ref 98–111)
Creatinine, Ser: 2.95 mg/dL — ABNORMAL HIGH (ref 0.44–1.00)
GFR calc Af Amer: 19 mL/min — ABNORMAL LOW (ref >60)
GFR calc non Af Amer: 16 mL/min — ABNORMAL LOW (ref >60)
Glucose, Bld: 175 mg/dL — ABNORMAL HIGH (ref 70–99)
Phosphorus: 5.7 mg/dL — ABNORMAL HIGH (ref 2.5–4.6)
Potassium: 5.1 mmol/L (ref 3.5–5.1)
Sodium: 137 mmol/L (ref 135–145)

## 2019-09-30 LAB — GLUCOSE, CAPILLARY
Glucose-Capillary: 169 mg/dL — ABNORMAL HIGH (ref 70–99)
Glucose-Capillary: 179 mg/dL — ABNORMAL HIGH (ref 70–99)
Glucose-Capillary: 182 mg/dL — ABNORMAL HIGH (ref 70–99)
Glucose-Capillary: 237 mg/dL — ABNORMAL HIGH (ref 70–99)

## 2019-09-30 MED ORDER — FUROSEMIDE 40 MG PO TABS
40.0000 mg | ORAL_TABLET | Freq: Every day | ORAL | Status: DC
  Administered 2019-09-30 – 2019-10-02 (×3): 40 mg via ORAL
  Filled 2019-09-30 (×3): qty 1

## 2019-09-30 MED ORDER — FUROSEMIDE 40 MG PO TABS: 40.0000 mg | ORAL_TABLET | Freq: Every day | ORAL | Status: DC

## 2019-09-30 MED ORDER — CARVEDILOL 6.25 MG PO TABS
6.2500 mg | ORAL_TABLET | Freq: Two times a day (BID) | ORAL | Status: DC
  Administered 2019-09-30 – 2019-10-02 (×5): 6.25 mg via ORAL
  Filled 2019-09-30 (×5): qty 1

## 2019-09-30 NOTE — Progress Notes (Addendum)
Patient has been denied by insurance for SNF, stating patient has no skilled need.  She will require peer-to-peer conversation tomorrow, 10/21 before 12 noon.  Please call [phone] with reference number: [id number] and discussed the patient's wound care/skilled nursing needs as below:  Patient is s/p incision and debridment of mons soft tissue infection and spontaneously drainingleft labial abscess 6 x 6 x 8 cm , 10/6 with Dr. Ninfa Linden Wound care plan:  Change dressing at mons site using wet-to-dry Kerlix dressing and cover with ABD pad over site twice daily  Dry dressing changes on labial abscess twice daily as needed  Also patient has pain in left thigh while mobilizing and with antalgic gait pattern noted by PT.   Thank you, Marva Panda, DO

## 2019-09-30 NOTE — Progress Notes (Signed)
PROGRESS NOTE    Maureen ROADS  Jackson:[id number] DOB: [birth year] DOA: 09/08/2019 PCP: Neale Burly, MD   Brief Narrative: 63 year old with past medical history significant for morbid obesity, systolic heart failure, hypothyroidism, hypertension, CAD and obstructive sleep apnea.  Presents with  generalized weakness, difficulty voiding, poor appetite and insomnia for 3 days.  September 26,  2 days prior to hospitalization she was seen in the emergency department for left labia upper thigh abscess which was drained.  Discharge home on oral antibiotics.  Patient had worsening renal function in the setting of concern for Fournier gangrene, patient was transferred from [facility]  to [facility] for further management. Repeat CT scan done on 10/5 findings worrisome for Fournier gangrene: Patient was taken to the OR by general surgery on 10/6. Was also noted to be in an acute HF exacerbation. Nephrology was consulted, patient was a started on hemodialysis on 10/6 and had HD on 10-08.   Nephro signed off 10/13 with improvement in urine output and volume status for patient and recurrence of AKI and this was discussed with nephro with continuation of Lasix, spironolactone bladder scan/straight cath protocol. Transferred to med-surg bed 10/19.  Patient currently awaiting placement to SNF, likely Wednesday pending renal function and bed.     Assessment & Plan:   Principal Problem:   Fournier's gangrene in female Active Problems:   Hypothyroidism   Essential hypertension   Morbid obesity/BMI > 55   Diabetes mellitus type 2 with complications, uncontrolled (HCC)   CAD, multiple vessel   Ischemic cardiomyopathy   Chronic combined systolic and diastolic CHF (congestive heart failure) (EF 30 to 35 %)   CAP (community acquired pneumonia)   Labial abscess   Abscess   Fistula   AMS (altered mental status)   Drug rash   Acute renal failure (ARF) (HCC)   Pressure injury of skin  AKI; overall has improved  following HD treatments however now creatinine above increasing again, currently 2.21->>2.9.  Spironolactone held yesterday due to rising potassium, getting p.o. Lasix -Continue holding home Entresto until renal function improves -Hold spironolactone -Nephro on board - to reduce patient's Coreg dose and follow up in AM -Monitor renal function -Will need follow-up outpatient with nephro  Hyperkalemia asymptomatic. K 5.3 -> 5.1today - Hold spironolactone -Continue lasix  Labial abscess/Fournier gangrene complicated with sepsis/acute metabolic encephalopathy Patient presented with leukocytosis, fever post sediment in [place]. ID was consulted and had been helping with antibiotics, currently signed off, antibiotics had been changed from Zyvox, Unasyn  to Levaquin and Doxycycline in light of developing rash.  Completed 10 days antibiotics. -Surgery recommending wet-to-dry dressing twice a day   Nausea resolved  Rash/dry skin;Stable Benadryl cream and oral benadryl PRN.  Unasyn and Zyvox discontinued.,  Completed antibiotics as above.  Can use moisturizer cream to back prn  Acute on chronic systolic heart failure exacerbation, resolved. 58 kg (10/13)-> 186 kg (unlikely accurate).  On home carvedilol and spironolactone then home Lasix -Continue aspirin/ plavix.   -Continue holding Entresto  -Hold Spironolactone  Diabetes: Continue with sliding scale insulin  Morbid obesity with OSA: Continue with CPAP Body mass index 60  Hypothyroidism Continue with Synthroid.  Acute metabolic encephalopathy: Resolved.  Related to acute illness renal failure sepsis infection.     Nutrition Problem: Inadequate oral intake Etiology: acute illness(pneumonia; abscess in left labia/upper thigh area)    Signs/Symptoms: meal completion < 25%    Interventions: MVI, Magic cup  Estimated body mass index is 57.49  kg/m as calculated from the following:   Height as of this encounter: 5\' 5"  (1.651 m).    Weight as of this encounter: 156.7 kg.   DVT prophylaxis: Heparin Code Status: Full code Family communication family over the phone at bedside. Disposition Plan: Patient currently awaiting placement to SNF, likely Wednesday pending renal function and bed.  Consultants:   surgery  ID  Nephrology  Procedures:   Irrigation and debridement months abscess 6 x 6 by day 10/6  Had HD on 10/6 and 10-08  Antimicrobials:  All antibiotics completed Levaquin and doxy Received Zyvox, Unasyn  Subjective:  Patient seen and examined at bedside no acute distress and resting comfortably.  No events overnight.  Tolerating diet.  Denies any chest pain, shortness of breath, fever, nausea, vomiting, urinary complaints.  Admits to having bowel movement.  Otherwise ROS negative    Objective: Vitals:   09/29/19 2014 09/30/19 0014 09/30/19 0224 09/30/19 0329  BP: (!) 120/59 116/72  140/74  Pulse: 65 73  79  Resp: 17 18  18   Temp: 98.6 F (37 C) 98.2 F (36.8 C)  98 F (36.7 C)  TempSrc: Oral Oral  Oral  SpO2: 100% 99%  100%  Weight:   (!) 156.7 kg   Height:        Intake/Output Summary (Last 24 hours) at 09/30/2019 0746 Last data filed at 09/30/2019 0600 Gross per 24 hour  Intake 840 ml  Output 675 ml  Net 165 ml   Filed Weights   09/27/19 0654 09/29/19 0626 09/30/19 0224  Weight: (!) 155.3 kg (!) 153.1 kg (!) 156.7 kg    Examination: Physical Exam Vitals signs and nursing note reviewed.  Constitutional:      Appearance: She is obese.  HENT:     Head: Normocephalic and atraumatic.     Mouth/Throat:     Mouth: Mucous membranes are moist.  Eyes:     Extraocular Movements: Extraocular movements intact.  Neck:     Musculoskeletal: Normal range of motion. No neck rigidity.  Cardiovascular:     Rate and Rhythm: Normal rate and regular rhythm.  Pulmonary:     Effort: Pulmonary effort is normal.     Breath sounds: Normal breath sounds.  Abdominal:     General: Abdomen  is flat.     Palpations: Abdomen is soft.  Musculoskeletal: Normal range of motion.     Comments: 1+bilateral lower extremity pitting edema  Neurological:     General: No focal deficit present.     Mental Status: She is alert. Mental status is at baseline.  Psychiatric:        Mood and Affect: Mood normal.        Behavior: Behavior normal.        Data Reviewed: I have personally reviewed following labs and imaging studies  CBC: Recent Labs  Lab 09/24/19 0546 09/25/19 0702 09/26/19 0424 09/29/19 0405  WBC 7.0 7.5 7.2 5.7  HGB 8.1* 8.3* 8.3* 8.0*  HCT 26.8* 27.7* 26.7* 26.8*  MCV 90.5 92.0 91.8 93.1  PLT 198 207 186 706   Basic Metabolic Panel: Recent Labs  Lab 09/26/19 0424 09/27/19 0904 09/28/19 1012 09/29/19 0405 09/30/19 0356  NA 140 138 139 138 137  K 4.1 4.2 4.6 5.3* 5.1  CL 109 108 108 107 107  CO2 20* 19* 21* 20* 20*  GLUCOSE 217* 221* 192* 160* 175*  BUN 40* 45* 47* 50* 56*  CREATININE 2.55* 2.39* 2.21* 2.57* 2.95*  CALCIUM  7.4* 7.3* 7.3* 7.5* 7.7*  PHOS 3.4 4.0 4.5 5.2* 5.7*   GFR: Estimated Creatinine Clearance: 30.6 mL/min (A) (by C-G formula based on SCr of 2.95 mg/dL (H)). Liver Function Tests: Recent Labs  Lab 09/26/19 0424 09/27/19 0904 09/28/19 1012 09/29/19 0405 09/30/19 0356  ALBUMIN 2.1* 2.2* 2.2* 2.3* 2.4*   No results for input(s): LIPASE, AMYLASE in the last 168 hours. No results for input(s): AMMONIA in the last 168 hours. Coagulation Profile: No results for input(s): INR, PROTIME in the last 168 hours. Cardiac Enzymes: No results for input(s): CKTOTAL, CKMB, CKMBINDEX, TROPONINI in the last 168 hours. BNP (last 3 results) No results for input(s): PROBNP in the last 8760 hours. HbA1C: No results for input(s): HGBA1C in the last 72 hours. CBG: Recent Labs  Lab 09/29/19 0656 09/29/19 1128 09/29/19 1650 09/29/19 2103 09/30/19 0538  GLUCAP 136* 123* 142* 272* 169*   Lipid Profile: No results for input(s): CHOL, HDL,  LDLCALC, TRIG, CHOLHDL, LDLDIRECT in the last 72 hours. Thyroid Function Tests: No results for input(s): TSH, T4TOTAL, FREET4, T3FREE, THYROIDAB in the last 72 hours. Anemia Panel: No results for input(s): VITAMINB12, FOLATE, FERRITIN, TIBC, IRON, RETICCTPCT in the last 72 hours. Sepsis Labs: No results for input(s): PROCALCITON, LATICACIDVEN in the last 168 hours.  Recent Results (from the past 240 hour(s))  SARS CORONAVIRUS 2 (TAT 6-24 HRS) Nasopharyngeal Nasopharyngeal Swab     Status: None   Collection Time: 09/25/19  5:00 PM   Specimen: Nasopharyngeal Swab  Result Value Ref Range Status   SARS Coronavirus 2 NEGATIVE NEGATIVE Final    Comment: (NOTE) SARS-CoV-2 target nucleic acids are NOT DETECTED. The SARS-CoV-2 RNA is generally detectable in upper and lower respiratory specimens during the acute phase of infection. Negative results do not preclude SARS-CoV-2 infection, do not rule out co-infections with other pathogens, and should not be used as the sole basis for treatment or other patient management decisions. Negative results must be combined with clinical observations, patient history, and epidemiological information. The expected result is Negative. Fact Sheet for Patients: SugarRoll.be Fact Sheet for Healthcare Providers: https://www.woods-mathews.com/ This test is not yet approved or cleared by the [place] FDA and  has been authorized for detection and/or diagnosis of SARS-CoV-2 by FDA under an Emergency Use Authorization (EUA). This EUA will remain  in effect (meaning this test can be used) for the duration of the COVID-19 declaration under Section 56 4(b)(1) of the Act, 21 U.S.C. section 360bbb-3(b)(1), unless the authorization is terminated or revoked sooner. Performed at [facility], [place] [address] [place], [place] [address]   SARS CORONAVIRUS 2 (TAT 6-24 HRS) Nasopharyngeal Nasopharyngeal Swab     Status:  None   Collection Time: 09/29/19  9:28 AM   Specimen: Nasopharyngeal Swab  Result Value Ref Range Status   SARS Coronavirus 2 NEGATIVE NEGATIVE Final    Comment: (NOTE) SARS-CoV-2 target nucleic acids are NOT DETECTED. The SARS-CoV-2 RNA is generally detectable in upper and lower respiratory specimens during the acute phase of infection. Negative results do not preclude SARS-CoV-2 infection, do not rule out co-infections with other pathogens, and should not be used as the sole basis for treatment or other patient management decisions. Negative results must be combined with clinical observations, patient history, and epidemiological information. The expected result is Negative. Fact Sheet for Patients: SugarRoll.be Fact Sheet for Healthcare Providers: https://www.woods-mathews.com/ This test is not yet approved or cleared by the [place] FDA and  has been authorized for detection  and/or diagnosis of SARS-CoV-2 by FDA under an Emergency Use Authorization (EUA). This EUA will remain  in effect (meaning this test can be used) for the duration of the COVID-19 declaration under Section 56 4(b)(1) of the Act, 21 U.S.C. section 360bbb-3(b)(1), unless the authorization is terminated or revoked sooner. Performed at [facility], [place] [address], [place], [place] [address]          Radiology Studies: No results found.      Scheduled Meds: . aspirin EC  81 mg Oral Daily  . atorvastatin  80 mg Oral QPM  . carvedilol  25 mg Oral BID  . clopidogrel  75 mg Oral q morning - 10a  . darbepoetin (ARANESP) injection - NON-DIALYSIS  100 mcg Subcutaneous Q Mon-1800  . furosemide  40 mg Oral Daily  . heparin injection (subcutaneous)  5,000 Units Subcutaneous Q8H  . insulin aspart  0-9 Units Subcutaneous TID WC  . levothyroxine  125 mcg Oral Q0600  . multivitamin  1 tablet Oral QHS  . nystatin   Topical TID  . saccharomyces boulardii   250 mg Oral BID  . sodium chloride flush  10-40 mL Intracatheter Q12H   Continuous Infusions: . sodium chloride       LOS: 22 days    Time spent: 20 minutes.     Harold Hedge, DO [facility] Pager [phone]  If 7PM-7AM, please contact night-coverage [email or web address] Password [facility] 09/30/2019, 7:46 AM

## 2019-09-30 NOTE — Progress Notes (Signed)
Patient ID: RIE MCNEIL, female   DOB: 03-15-1956, 63 y.o.   MRN: [id number] S: Feels well, no complaints.  Sitting up on side of bed. Says is continent and can tell when she needs to urinate and able to control.  Not much UOP recorded but more than previous.  crt trended in wrong direction again from yest to today.  Some soft BP's    O:BP 140/74 (BP Location: Left Wrist)   Pulse 79   Temp 98 F (36.7 C) (Oral)   Resp 18   Ht 5\' 5"  (1.651 m)   Wt (!) 156.7 kg   SpO2 100%   BMI 57.49 kg/m   Intake/Output Summary (Last 24 hours) at 09/30/2019 0749 Last data filed at 09/30/2019 0600 Gross per 24 hour  Intake 840 ml  Output 675 ml  Net 165 ml   Intake/Output: I/O last 3 completed shifts: In: 840 [P.O.:840] Out: 675 [Urine:675]  Intake/Output this shift:  No intake/output data recorded. Weight change: 3.583 kg Gen: obese AAF in NAD  CVS: no rub Resp: cta Abd: obese, +BS, soft, mildly tender Ext:  1+ pretibial edema  Recent Labs  Lab 09/24/19 0546 09/25/19 0702 09/26/19 0424 09/27/19 0904 09/28/19 1012 09/29/19 0405 09/30/19 0356  NA 144 142 140 138 139 138 137  K 3.8 3.9 4.1 4.2 4.6 5.3* 5.1  CL 112* 110 109 108 108 107 107  CO2 22 21* 20* 19* 21* 20* 20*  GLUCOSE 134* 163* 217* 221* 192* 160* 175*  BUN 39* 36* 40* 45* 47* 50* 56*  CREATININE 2.15* 2.04* 2.55* 2.39* 2.21* 2.57* 2.95*  ALBUMIN 1.9* 2.0* 2.1* 2.2* 2.2* 2.3* 2.4*  CALCIUM 7.5* 7.4* 7.4* 7.3* 7.3* 7.5* 7.7*  PHOS 2.9 2.8 3.4 4.0 4.5 5.2* 5.7*   Liver Function Tests: Recent Labs  Lab 09/28/19 1012 09/29/19 0405 09/30/19 0356  ALBUMIN 2.2* 2.3* 2.4*   No results for input(s): LIPASE, AMYLASE in the last 168 hours. No results for input(s): AMMONIA in the last 168 hours. CBC: Recent Labs  Lab 09/24/19 0546 09/25/19 0702 09/26/19 0424 09/29/19 0405  WBC 7.0 7.5 7.2 5.7  HGB 8.1* 8.3* 8.3* 8.0*  HCT 26.8* 27.7* 26.7* 26.8*  MCV 90.5 92.0 91.8 93.1  PLT 198 207 186 174   Cardiac  Enzymes: No results for input(s): CKTOTAL, CKMB, CKMBINDEX, TROPONINI in the last 168 hours. CBG: Recent Labs  Lab 09/29/19 0656 09/29/19 1128 09/29/19 1650 09/29/19 2103 09/30/19 0538  GLUCAP 136* 123* 142* 272* 169*    Iron Studies: No results for input(s): IRON, TIBC, TRANSFERRIN, FERRITIN in the last 72 hours. Studies/Results: No results found. Marland Kitchen aspirin EC  81 mg Oral Daily  . atorvastatin  80 mg Oral QPM  . carvedilol  25 mg Oral BID  . clopidogrel  75 mg Oral q morning - 10a  . darbepoetin (ARANESP) injection - NON-DIALYSIS  100 mcg Subcutaneous Q Mon-1800  . furosemide  40 mg Oral Daily  . heparin injection (subcutaneous)  5,000 Units Subcutaneous Q8H  . insulin aspart  0-9 Units Subcutaneous TID WC  . levothyroxine  125 mcg Oral Q0600  . multivitamin  1 tablet Oral QHS  . nystatin   Topical TID  . saccharomyces boulardii  250 mg Oral BID  . sodium chloride flush  10-40 mL Intracatheter Q12H    BMET    Component Value Date/Time   NA 137 09/30/2019 0356   K 5.1 09/30/2019 0356   CL 107 09/30/2019 0356  CO2 20 (L) 09/30/2019 0356   GLUCOSE 175 (H) 09/30/2019 0356   BUN 56 (H) 09/30/2019 0356   CREATININE 2.95 (H) 09/30/2019 0356   CALCIUM 7.7 (L) 09/30/2019 0356   GFRNONAA 16 (L) 09/30/2019 0356   GFRAA 19 (L) 09/30/2019 0356   CBC    Component Value Date/Time   WBC 5.7 09/29/2019 0405   RBC 2.88 (L) 09/29/2019 0405   HGB 8.0 (L) 09/29/2019 0405   HCT 26.8 (L) 09/29/2019 0405   PLT 174 09/29/2019 0405   MCV 93.1 09/29/2019 0405   MCH 27.8 09/29/2019 0405   MCHC 29.9 (L) 09/29/2019 0405   RDW 16.5 (H) 09/29/2019 0405   LYMPHSABS 2.2 09/17/2019 0947   MONOABS 0.8 09/17/2019 0947   EOSABS 0.6 (H) 09/17/2019 0947   BASOSABS 0.0 09/17/2019 0947    Assessment/Plan:  1. AKI- presumably due to combination of ischemic ATN as well as vancomycin nephrotoxicity. She underwent HD on 10/6 and again on 09/18/19 with nontunneled HD catheterwhich was removed  09/23/19. Since then, her UOP improved as well as her BUN/Crwe signed off but then with bump in Crt- seemingly resolved but bumped again today for unclear reasons  1. No U/A resulted, clinically does not appear to be obstructed  2. Creatinine initially  improved but now worsened , poor documented UOP.   Cont to follow renal function daily-  I do not think she is clinically dry given edema but some soft BPs so will decrease the coreg and follow  3. Cont to hold entresto but ok to resume po lasix  4. Will eventually need f/u with our office after discharge but not right to send her out with rising creatinine 2. Left labial abscess with sepsis- improving with antibiotics and wound care/drainage. 3. AMS- improving. Multifactorial with sepsis and AKI/meds, now resolved. 4. Anemia- due to acute/critical illness. Aranesp started on 09/15/19- will inc dose and s/p feraheme. Hgb stable but poor . Transfuse prn.  [name]  [employer] [phone]

## 2019-09-30 NOTE — TOC Progression Note (Addendum)
Transition of Care [facility]) - Progression Note    Patient Details  Name: JALAYNE GANESH MRN: [id number] Date of Birth: 22-Oct-1956  [facility] CM/SW Contact  Zenon Mayo, RN Phone Number: 09/30/2019, 3:09 PM  Clinical Narrative:    NCM received vm from Tammy with [facility] stating they received a denial from patient's insurance co . MD can do peer to peer for ref number [id number] and it will need to be done before 12 noon..  10/21- NCM updated patient and  patient daughter regarding the denial and peer to peer to be done.   1520 - NCM informed by MD that patient was approved, her covid test is still good, NCM informed Tammy with [facility] , she states she can take patient at 10 am in the am.  NCM will notify daughter also.   Expected Discharge Plan: Skilled Nursing Facility Barriers to Discharge: No Barriers Identified  Expected Discharge Plan and Services Expected Discharge Plan: [place] In-house Referral: NA Discharge Planning Services: CM Consult Post Acute Care Choice: [place] Living arrangements for the past 2 months: Single Family Home                 DME Arranged: (NA)         HH Arranged: NA           Social Determinants of Health (SDOH) Interventions    Readmission Risk Interventions Readmission Risk Prevention Plan 09/18/2019  Transportation Screening Complete  HRI or [place] Complete  Social Work Consult for [place] Planning/Counseling Complete  Palliative Care Screening Not Applicable  Medication Review Press photographer) Complete  Some recent data might be hidden

## 2019-09-30 NOTE — Progress Notes (Signed)
Nutrition Follow-up  RD working remotely.  DOCUMENTATION CODES:   Morbid obesity  INTERVENTION:   -Continue renal MVI daily -Continue double protein portions at meals -Continue Magic cup TID with meals, each supplement provides 290 kcal and 9 grams of protein  NUTRITION DIAGNOSIS:   Inadequate oral intake related to acute illness(pneumonia; abscess in left labia/upper thigh area) as evidenced by meal completion < 25%.  Ongoing  GOAL:   Patient will meet greater than or equal to 90% of their needs  Progressing   MONITOR:   PO intake, Labs, I & O's, Weight trends, Supplement acceptance, Skin  REASON FOR ASSESSMENT:   Consult Assessment of nutrition requirement/status  ASSESSMENT:   63 year old female with medical history significant for morbid obesity, CHF, hypothyroidism, HTN, OSA, CAD, T2DM, and arthritis who presented to ED with complaints of generalized weakness, severe dry cough, difficulty voiding, poor appetite, and difficulty sleeping for the past 3 days. Patient seen in ED 9/26 for abscess in left labia/upper thigh area that has been draining large amounts of pus; patient discharged on doxycyline and pain medication. Portable CXR showed multifocal pneumonia underlying pulmonary vascular congestion  10/1- transferred from [facility] to [facility] for surgery evaluation 10/2- surgery cancelled 2/2 plavix 10/6- s/p tunnelled catheter placement and HD initiation ; s/pIRRIGATION AND DEBRIDEMENTMONS ABSCESS (6 CM X 6 CM X 8 CM) 10/13- HD cath d/c  Reviewed I/O's: +165 ml x 24 hours and -1.4 L since 09/16/19  UOP: 675 ml x 24 hours  Pt's intake continues to improve. Noted meal completion 75-100%.   Per nephrology notes, continuing to watch renal function. UOP has improved since yesterday.   Plan to d/c to SNF once bed is available and pt medically stable.   Labs reviewed: Phos: 5.7. CBGS: 142-272 (inpatient orders for glycemic control are 0-9 units insulin aspart TID with  meals).   Diet Order:   Diet Order            Diet renal/carb modified with fluid restriction Diet-HS Snack? Nothing; Fluid restriction: 1200 mL Fluid; Room service appropriate? Yes; Fluid consistency: Thin  Diet effective now              EDUCATION NEEDS:   Not appropriate for education at this time  Skin:  Skin Assessment: Skin Integrity Issues: Skin Integrity Issues:: Stage II Stage II: lt buttocks Other: lt labial abscess concerning for Fournier's gangrene  Last BM:  09/29/19  Height:   Ht Readings from Last 1 Encounters:  09/08/19 5\' 5"  (1.651 m)    Weight:   Wt Readings from Last 1 Encounters:  09/30/19 (!) 156.7 kg    Ideal Body Weight:  56.8 kg  BMI:  Body mass index is 57.49 kg/m.  Estimated Nutritional Needs:   Kcal:  1800-2000  Protein:  120-140 grams  Fluid:  1.2 L    Jerimyah Vandunk A. Jimmye Norman, RD, LDN, [place] Registered Dietitian II Certified Diabetes Care and Education Specialist Pager: [phone] After hours Pager: [phone]

## 2019-09-30 NOTE — Progress Notes (Addendum)
See Surgeons notes- Per Order set-  Change Dressing at mons site using a Wet to dry kerlix dressing (use sterile solution) and cover with an ABD pad over site twice a day.     Per Order set -  Dry dressing changes on labial  abscess BID and PRN.

## 2019-10-01 DIAGNOSIS — N7689 Other specified inflammation of vagina and vulva: Secondary | ICD-10-CM | POA: Diagnosis not present

## 2019-10-01 LAB — CBC
HCT: 27.1 % — ABNORMAL LOW (ref 36.0–46.0)
Hemoglobin: 8.4 g/dL — ABNORMAL LOW (ref 12.0–15.0)
MCH: 28.8 pg (ref 26.0–34.0)
MCHC: 31 g/dL (ref 30.0–36.0)
MCV: 92.8 fL (ref 80.0–100.0)
Platelets: 143 10*3/uL — ABNORMAL LOW (ref 150–400)
RBC: 2.92 MIL/uL — ABNORMAL LOW (ref 3.87–5.11)
RDW: 17 % — ABNORMAL HIGH (ref 11.5–15.5)
WBC: 4.2 10*3/uL (ref 4.0–10.5)
nRBC: 0 % (ref 0.0–0.2)

## 2019-10-01 LAB — RENAL FUNCTION PANEL
Albumin: 2.3 g/dL — ABNORMAL LOW (ref 3.5–5.0)
Anion gap: 10 (ref 5–15)
BUN: 57 mg/dL — ABNORMAL HIGH (ref 8–23)
CO2: 19 mmol/L — ABNORMAL LOW (ref 22–32)
Calcium: 7.7 mg/dL — ABNORMAL LOW (ref 8.9–10.3)
Chloride: 108 mmol/L (ref 98–111)
Creatinine, Ser: 2.66 mg/dL — ABNORMAL HIGH (ref 0.44–1.00)
GFR calc Af Amer: 22 mL/min — ABNORMAL LOW (ref >60)
GFR calc non Af Amer: 19 mL/min — ABNORMAL LOW (ref >60)
Glucose, Bld: 176 mg/dL — ABNORMAL HIGH (ref 70–99)
Phosphorus: 5.1 mg/dL — ABNORMAL HIGH (ref 2.5–4.6)
Potassium: 4.9 mmol/L (ref 3.5–5.1)
Sodium: 137 mmol/L (ref 135–145)

## 2019-10-01 LAB — GLUCOSE, CAPILLARY
Glucose-Capillary: 167 mg/dL — ABNORMAL HIGH (ref 70–99)
Glucose-Capillary: 180 mg/dL — ABNORMAL HIGH (ref 70–99)
Glucose-Capillary: 153 mg/dL — ABNORMAL HIGH (ref 70–99)
Glucose-Capillary: 133 mg/dL — ABNORMAL HIGH (ref 70–99)

## 2019-10-01 NOTE — Discharge Summary (Addendum)
Physician Discharge Summary  Maureen Jackson RJJ:[id number] DOB: 1956/02/20 DOA: 09/08/2019  PCP: Neale Burly, MD  Admit date: 09/08/2019 Discharge date: 10/02/2019  Admitted From: Home Disposition:  SNF   Recommendations for Outpatient Follow-up:  1. Follow up with PCP in 1 week 2. Follow up with [facility] surgery Dr. Ninfa Linden in 3-4 weeks 3. Repeat BMP to check AKI is resolving in 1 week   Discharge Condition: Stable CODE STATUS: Full  Diet recommendation: Carb modified   Brief/Interim Summary: Maureen Jackson is a 63 year old with past medical history significant for morbid obesity, systolic heart failure, hypothyroidism, hypertension, CAD and obstructive sleep apnea. Presents with generalized weakness, difficulty voiding, poor appetite and insomnia for 3 days. September 26, 2 days prior to hospitalization she was seen in the emergency department for left labia upper thigh abscess which was drained. Discharged home on oral antibiotics. Patient had worsening renal function in the setting of concern for Fournier gangrene, thus patient was transferred from [facility] for further management. Repeat CT scan done on 10/5 findings worrisome for Fournier gangrene. Patient was taken to the OR by general surgery on 10/6. Was also noted to be in an acute HF exacerbation. Nephrology was consulted and patient was a started on hemodialysis on 10/6 and had HD on 10/8. Nephro signed off 10/13 with improvement in urine output and volume status for patient and recurrence of AKI and this was discussed with nephro with continuation of Lasix, spironolactone bladder scan/straight cath protocol. Transferred to med-surg bed 10/19. Patient discharged to SNF.   Discharge Diagnoses:  Principal Problem:   Fournier's gangrene in female Active Problems:   Hypothyroidism   Essential hypertension   Morbid obesity/BMI > 55   Diabetes mellitus type 2 with complications, uncontrolled (HCC)    CAD, multiple vessel   Ischemic cardiomyopathy   Chronic combined systolic and diastolic CHF (congestive heart failure) (EF 30 to 35 %)   CAP (community acquired pneumonia)   Labial abscess   Abscess   Fistula   AMS (altered mental status)   Drug rash   Acute renal failure (ARF) (HCC)   Pressure injury of skin    Labial abscess and Fournier gangrene complicated with severe sepsis with acute metabolic encephalopathy -S/p incision and debridment of mons soft tissue infection and spontaneously drainingleft labial abscess, 10/6 Dr. Ninfa Linden -She has now completed course of antibiotics  -General surgery recommending BID dressing changes and to follow-up in office with Dr. Ninfa Linden in 3 to 4 weeks for wound check -Change dressingat mons siteusing wet-to-dry Kerlix dressing and cover with ABD pad over site twice daily. Dry dressing changes on labial abscess twice daily as needed  AKI -Presumed to be combination of ischemic ATN as well as vancomycin nephrotoxicity.  Has required HD during hospitalization, now off  -Continue holding home Entresto, spironolactone until renal function improves -Appreciate nephrology, plan to follow-up with them in the office with follow-up BMP.  Nephrology signed off 10/21  Acute on chronic systolic heart failure exacerbation -Resolved -Continue carvedilol at a lower dose, Lasix.  Spironolactone and Entresto on hold due to renal dysfunction  CAD -Continue aspirin/plavix, Lipitor  Diabetes -Continue with sliding scale insulin  Morbid obesity -Estimated body mass index is 57.36 kg/m as calculated from the following:   Height as of this encounter: _0  (1.651 m).   Weight as of this encounter: 156.4 kg.  Hypothyroidism  -Continue Synthroid  Acute metabolic encephalopathy -Resolved   In agreement with assessment of  the pressure ulcer as below:  Pressure Injury 09/20/19 Buttocks Left Stage II -  Partial thickness loss of dermis presenting  as a shallow open ulcer with a red, pink wound bed without slough. (Active)  09/20/19 1302  Location: Buttocks  Location Orientation: Left  Staging: Stage II -  Partial thickness loss of dermis presenting as a shallow open ulcer with a red, pink wound bed without slough.  Wound Description (Comments):   Present on Admission:     Nutrition Problem: Inadequate oral intake Etiology: acute illness(pneumonia; abscess in left labia/upper thigh area)  Discharge Instructions  Discharge Instructions    Diet Carb Modified   Complete by: As directed    Increase activity slowly   Complete by: As directed      Allergies as of 10/02/2019      Reactions   Unasyn [ampicillin-sulbactam Sodium]    ? Rash    Aspirin Nausea Only      Medication List    STOP taking these medications   amLODipine 5 MG tablet Commonly known as: NORVASC   doxycycline 100 MG capsule Commonly known as: VIBRAMYCIN   Entresto 97-103 MG Generic drug: sacubitril-valsartan   HYDROcodone-acetaminophen 5-325 MG tablet Commonly known as: NORCO/VICODIN   insulin degludec 100 UNIT/ML Sopn FlexTouch Pen Commonly known as: TRESIBA   Insulin Pen Needle 32G X 4 MM Misc   Jardiance 10 MG Tabs tablet Generic drug: empagliflozin   metFORMIN 1000 MG tablet Commonly known as: GLUCOPHAGE   oxyCODONE-acetaminophen 5-325 MG tablet Commonly known as: PERCOCET/ROXICET   spironolactone 25 MG tablet Commonly known as: ALDACTONE     TAKE these medications   aspirin EC 81 MG tablet Take 81 mg by mouth daily.   atorvastatin 80 MG tablet Commonly known as: LIPITOR Take 1 tablet (80 mg total) by mouth every evening.   carvedilol 6.25 MG tablet Commonly known as: COREG Take 1 tablet (6.25 mg total) by mouth 2 (two) times daily with a meal. What changed:   medication strength  how much to take  when to take this   cetirizine 10 MG tablet Commonly known as: ZYRTEC Take 10 mg by mouth daily as needed for  allergies.   clopidogrel 75 MG tablet Commonly known as: PLAVIX TAKE 1 TABLET(75 MG) BY MOUTH DAILY WITH BREAKFAST What changed: See the new instructions.   furosemide 40 MG tablet Commonly known as: LASIX Take 1 tablet (40 mg total) by mouth daily.   levothyroxine 125 MCG tablet Commonly known as: SYNTHROID Take 125 mcg by mouth daily before breakfast.   NovoLOG FlexPen 100 UNIT/ML FlexPen Generic drug: insulin aspart Inject 15 Units into the skin 3 (three) times daily with meals.   ondansetron 4 MG disintegrating tablet Commonly known as: Zofran ODT Take 1 tablet (4 mg total) by mouth every 8 (eight) hours as needed.   timolol 0.5 % ophthalmic solution Commonly known as: TIMOPTIC Place 1 drop into both eyes 2 (two) times daily.   traMADol 50 MG tablet Commonly known as: ULTRAM Take 1 tablet (50 mg total) by mouth every 6 (six) hours as needed for severe pain.   Ventolin HFA 108 (90 Base) MCG/ACT inhaler Generic drug: albuterol Inhale 1-2 puffs into the lungs every 6 (six) hours as needed for wheezing or shortness of breath.       Contact information for follow-up providers    Coralie Keens, MD Follow up in 1 month(s).   Specialty: General Surgery Contact information: [address]  [place] [address] [phone]            Contact information for after-discharge care    Destination    [facility] .   Service: Skilled Nursing Contact information: [place] [address] [phone]                 Allergies  Allergen Reactions  . Unasyn [Ampicillin-Sulbactam Sodium]     ? Rash   . Aspirin Nausea Only    Consultations:  Nephrology  General surgery  Infectious disease   Procedures/Studies: Ct Abdomen Pelvis Wo Contrast  Result Date: 09/10/2019 CLINICAL DATA:  Left-sided labial swelling for 3-4 days. Possible abscess. EXAM: CT ABDOMEN AND PELVIS WITHOUT CONTRAST  TECHNIQUE: Multidetector CT imaging of the abdomen and pelvis was performed following the standard protocol without IV contrast. COMPARISON:  None. FINDINGS: Lower chest: The heart is enlarged and there is a small pericardial effusion. Age advanced coronary artery calcifications are noted. Small bilateral pleural effusions with overlying atelectasis are noted. Hepatobiliary: No focal hepatic lesions are identified without contrast. Moderate respiratory motion to the liver limits evaluation. The gallbladder is grossly normal. No obvious common bile duct dilatation. Pancreas: Limited examination due to motion artifact but no gross abnormality. Spleen: Normal size.  No focal lesions. Adrenals/Urinary Tract: The adrenal glands are grossly normal. No obvious renal calculi, hydronephrosis or mass. The bladder is grossly normal. Stomach/Bowel: Limited evaluation due to motion artifact. No gross bowel abnormality or obstructive findings. The terminal ileum and appendix appear normal. Vascular/Lymphatic: Moderate atherosclerotic calcifications involving the aorta and iliac arteries but no aneurysm. Scattered mesenteric and retroperitoneal lymph nodes without mass or overt adenopathy. Reproductive: Surgically absent. Other: Marked diffuse subcutaneous soft tissue swelling/edema/fluid and gas in the region of the mons pubis, left labia, perineum and suprapubic region. I do not see a discrete drainable fluid collection or fistula. No intrapelvic component is identified. Findings worrisome for Fournier gangrene. Adjacent inflamed/enlarged inguinal lymph nodes are noted. Musculoskeletal: No significant bony findings. I do not see any evidence of septic arthritis or osteomyelitis. IMPRESSION: 1. Extensive inflammatory/infectious process involving the mons pubis, labia, perineum and suprapubic regions with extensive gas formation. Findings worrisome for Fournier's gangrene. No discrete drainable soft tissue abscess. 2. No  intra-abdominal/intrapelvic abscess or obvious inflammatory process. 3. Small bilateral pleural effusions with overlying atelectasis. These results will be called to the ordering clinician or representative by the Radiologist Assistant, and communication documented in the PACS or zVision Dashboard. Electronically Signed   By: Marijo Sanes M.D.   On: 09/10/2019 12:05   Ct Pelvis Wo Contrast  Result Date: 09/16/2019 CLINICAL DATA:  Groin and labial abscess EXAM: CT PELVIS WITHOUT CONTRAST TECHNIQUE: Multidetector CT imaging of the pelvis was performed following the standard protocol without intravenous contrast. COMPARISON:  CT 09/10/2019 FINDINGS: Urinary Tract: Large habitus results in overall grainy appearance of images and limits the exam. Foley catheter in the bladder. Bowel:  Unremarkable visualized pelvic bowel loops. Vascular/Lymphatic: Enlarged left inguinal lymph nodes up to 12 mm. Reproductive: Status post hysterectomy. No adnexal mass. Persistent gas and inflammatory change within the mons pubis with small gas in soft tissue thickening tracking to the left labia. Suspected packing material at the left labia. No appreciable focal fluid collection though no intravenous contrast given. Other:  No pelvic effusion Musculoskeletal: No acute osseous abnormality IMPRESSION: 1. Persistent soft tissue gas and inflammatory change at the mons pubis, raising concern for necrotizing infection. Slight decreased gas collection at  the left labia with what appears to be packing material. No definitive focal fluid collection at the left labia to suggest residual abscess however examination limited by habitus and absence of intravenous contrast. There is ongoing edema and inflammatory change, compatible with soft tissue infection. 2. Mild left inguinal adenopathy, likely reactive Electronically Signed   By: Donavan Foil M.D.   On: 09/16/2019 00:17   US Renal  Result Date: 09/10/2019 CLINICAL DATA:  Acute kidney  injury. EXAM: RENAL / URINARY TRACT ULTRASOUND COMPLETE COMPARISON:  November 30, 2009 FINDINGS: Right Kidney: Renal measurements: 13.1 x 5.2 x 5.3 cm = volume: 194 mL . Echogenicity within normal limits. No mass or hydronephrosis visualized. Left Kidney: Renal measurements: 13.5 x 5 x 5.3 cm = volume: 188 mL. Echogenicity within normal limits. No mass or hydronephrosis visualized. Bladder: Not visualized. IMPRESSION: 1. Body habitus limited study. 2. Given the above limitation, no acute abnormality was detected. No hydronephrosis. Electronically Signed   By: Constance Holster M.D.   On: 09/10/2019 10:09   US Abdomen Limited  Result Date: 09/09/2019 CLINICAL DATA:  Left labia swelling. EXAM: LIMITED ULTRASOUND OF PELVIS TECHNIQUE: Limited transabdominal ultrasound examination of the pelvis was performed. COMPARISON:  None. FINDINGS: Ultrasound performed in the area of concern in the left labia. No fluid collection or other soft tissue abnormality noted. IMPRESSION: No focal fluid collection or other abnormality noted. Electronically Signed   By: Rolm Baptise M.D.   On: 09/09/2019 10:27   Ir Fluoro Guide Cv Line Right  Result Date: 09/16/2019 INDICATION: 63 year old female with a history of renal failure and leukocytosis EXAM: IMAGE GUIDED PLACEMENT OF TEMPORARY HEMODIALYSIS CATHETER MEDICATIONS: NONE ANESTHESIA/SEDATION: NONE FLUOROSCOPY TIME:  Fluoroscopy Time: 0 minutes 6 seconds (2 mGy). COMPLICATIONS: None PROCEDURE: Informed written consent was obtained from the patient's family after a discussion of the risks, benefits, and alternatives to treatment. Questions regarding the procedure were encouraged and answered. The right neck was prepped with chlorhexidine in a sterile fashion, and a sterile drape was applied covering the operative field. Maximum barrier sterile technique with sterile gowns and gloves were used for the procedure. A timeout was performed prior to the initiation of the procedure. A  micropuncture kit was utilized to access the right internal jugular vein under direct, real-time ultrasound guidance after the overlying soft tissues were anesthetized with 1% lidocaine with epinephrine. Ultrasound image documentation was performed. The microwire was kinked to measure appropriate catheter length. A stiff glidewire was advanced to the level of the IVC. A 16 cm hemodialysis catheter was then placed over the wire. Final catheter positioning was confirmed and documented with a spot radiographic image. The catheter aspirates and flushes normally. The catheter was flushed with appropriate volume heparin dwells. Dressings were applied. The patient tolerated the procedure well without immediate post procedural complication. IMPRESSION: Status post image guided placement of right IJ temporary hemodialysis catheter. Signed, Dulcy Fanny. Dellia Nims, RPVI [facility] Radiology Electronically Signed   By: Corrie Mckusick D.O.   On: 09/16/2019 14:35   Ir US Guide Vasc Access Right  Result Date: 09/16/2019 INDICATION: 63 year old female with a history of renal failure and leukocytosis EXAM: IMAGE GUIDED PLACEMENT OF TEMPORARY HEMODIALYSIS CATHETER MEDICATIONS: NONE ANESTHESIA/SEDATION: NONE FLUOROSCOPY TIME:  Fluoroscopy Time: 0 minutes 6 seconds (2 mGy). COMPLICATIONS: None PROCEDURE: Informed written consent was obtained from the patient's family after a discussion of the risks, benefits, and alternatives to treatment. Questions regarding the procedure were encouraged and answered. The  right neck was prepped with chlorhexidine in a sterile fashion, and a sterile drape was applied covering the operative field. Maximum barrier sterile technique with sterile gowns and gloves were used for the procedure. A timeout was performed prior to the initiation of the procedure. A micropuncture kit was utilized to access the right internal jugular vein under direct, real-time  ultrasound guidance after the overlying soft tissues were anesthetized with 1% lidocaine with epinephrine. Ultrasound image documentation was performed. The microwire was kinked to measure appropriate catheter length. A stiff glidewire was advanced to the level of the IVC. A 16 cm hemodialysis catheter was then placed over the wire. Final catheter positioning was confirmed and documented with a spot radiographic image. The catheter aspirates and flushes normally. The catheter was flushed with appropriate volume heparin dwells. Dressings were applied. The patient tolerated the procedure well without immediate post procedural complication. IMPRESSION: Status post image guided placement of right IJ temporary hemodialysis catheter. Signed, Dulcy Fanny. Dellia Nims, RPVI [facility] Radiology Electronically Signed   By: Corrie Mckusick D.O.   On: 09/16/2019 14:35   Dg Chest Port 1 View  Result Date: 09/15/2019 CLINICAL DATA:  Reason for exam: AMS Nurse tech reports patient has bilateral pneumonia. Hx of LBBB, htn, diabetes, cad, chf. Hx of coronary stent. Quit smoking 2007. EXAM: PORTABLE CHEST 1 VIEW COMPARISON:  Chest radiograph 09/13/2019 FINDINGS: Stable cardiomediastinal contours with enlarged heart size. There are persistent bilateral airspace opacities predominantly in the lower lungs. No pneumothorax or large pleural effusion. No acute finding in the visualized skeleton. IMPRESSION: Persistent predominantly lower lobe airspace opacities bilaterally most likely representing infection. Electronically Signed   By: Audie Pinto M.D.   On: 09/15/2019 13:50   Dg Chest Port 1 View  Result Date: 09/13/2019 CLINICAL DATA:  Dyspnea EXAM: PORTABLE CHEST 1 VIEW COMPARISON:  09/12/2019 FINDINGS: Bilateral interstitial and patchy alveolar airspace opacities particularly of the at the lung bases. No pleural effusion or pneumothorax. Stable cardiomegaly. No acute osseous  abnormality. IMPRESSION: Patchy bilateral lower lobe airspace disease most concerning for multilobar pneumonia. Electronically Signed   By: Kathreen Devoid   On: 09/13/2019 11:08   Dg Chest Port 1 View  Result Date: 09/12/2019 CLINICAL DATA:  Reason for exam: PNA Patient non verbal throughout exam. Hx of LBBB, htn, diabetes, cad, chrf. Hx of coronary stent 2018. Ex smoker quit 2007. EXAM: PORTABLE CHEST 1 VIEW COMPARISON:  Chest radiograph 09/08/2019 FINDINGS: Stable cardiomegaly. Central venous congestion. There are persistent airspace opacities in the bilateral lower lungs concerning for infection. No pneumothorax or large pleural effusion. No acute finding in the visualized skeleton. IMPRESSION: Persistent bilateral lower lung airspace opacities suspicious for infection. Electronically Signed   By: Audie Pinto M.D.   On: 09/12/2019 08:43   Dg Chest Port 1 View  Result Date: 09/08/2019 CLINICAL DATA:  Fever EXAM: PORTABLE CHEST 1 VIEW COMPARISON:  July 16, 2018 FINDINGS: There is airspace consolidation in each lower lung zone. There is cardiomegaly with pulmonary venous hypertension. No adenopathy evident. No bone lesions. There is postoperative change in the cervicothoracic junction region. IMPRESSION: Lower lung zone consolidation bilaterally, likely multifocal pneumonia. Underlying pulmonary vascular congestion. No adenopathy evident. Electronically Signed   By: Lowella Grip III M.D.   On: 09/08/2019 17:08       Discharge Exam: Vitals:   10/02/19 0418 10/02/19 0816  BP: 136/71 129/74  Pulse: 76 84  Resp: 20 16  Temp: 97.7 F (36.5 C) 98  F (36.7 C)  SpO2: 98% 99%    General: Pt is alert, awake, not in acute distress Cardiovascular: RRR, S1/S2 +, no edema Respiratory: CTA bilaterally, no wheezing, no rhonchi, no respiratory distress, no conversational dyspnea  Abdominal: Soft, NT, ND, bowel sounds + Extremities: no edema, no cyanosis Psych: Normal mood and affect, stable  judgement and insight     The results of significant diagnostics from this hospitalization (including imaging, microbiology, ancillary and laboratory) are listed below for reference.     Microbiology: Recent Results (from the past 240 hour(s))  SARS CORONAVIRUS 2 (TAT 6-24 HRS) Nasopharyngeal Nasopharyngeal Swab     Status: None   Collection Time: 09/25/19  5:00 PM   Specimen: Nasopharyngeal Swab  Result Value Ref Range Status   SARS Coronavirus 2 NEGATIVE NEGATIVE Final    Comment: (NOTE) SARS-CoV-2 target nucleic acids are NOT DETECTED. The SARS-CoV-2 RNA is generally detectable in upper and lower respiratory specimens during the acute phase of infection. Negative results do not preclude SARS-CoV-2 infection, do not rule out co-infections with other pathogens, and should not be used as the sole basis for treatment or other patient management decisions. Negative results must be combined with clinical observations, patient history, and epidemiological information. The expected result is Negative. Fact Sheet for Patients: SugarRoll.be Fact Sheet for Healthcare Providers: https://www.woods-mathews.com/ This test is not yet approved or cleared by the [place] FDA and  has been authorized for detection and/or diagnosis of SARS-CoV-2 by FDA under an Emergency Use Authorization (EUA). This EUA will remain  in effect (meaning this test can be used) for the duration of the COVID-19 declaration under Section 56 4(b)(1) of the Act, 21 U.S.C. section 360bbb-3(b)(1), unless the authorization is terminated or revoked sooner. Performed at [facility], [place] [address] [place], [place] [address]   SARS CORONAVIRUS 2 (TAT 6-24 HRS) Nasopharyngeal Nasopharyngeal Swab     Status: None   Collection Time: 09/29/19  9:28 AM   Specimen: Nasopharyngeal Swab  Result Value Ref Range Status   SARS Coronavirus 2 NEGATIVE NEGATIVE Final    Comment:  (NOTE) SARS-CoV-2 target nucleic acids are NOT DETECTED. The SARS-CoV-2 RNA is generally detectable in upper and lower respiratory specimens during the acute phase of infection. Negative results do not preclude SARS-CoV-2 infection, do not rule out co-infections with other pathogens, and should not be used as the sole basis for treatment or other patient management decisions. Negative results must be combined with clinical observations, patient history, and epidemiological information. The expected result is Negative. Fact Sheet for Patients: SugarRoll.be Fact Sheet for Healthcare Providers: https://www.woods-mathews.com/ This test is not yet approved or cleared by the [place] FDA and  has been authorized for detection and/or diagnosis of SARS-CoV-2 by FDA under an Emergency Use Authorization (EUA). This EUA will remain  in effect (meaning this test can be used) for the duration of the COVID-19 declaration under Section 56 4(b)(1) of the Act, 21 U.S.C. section 360bbb-3(b)(1), unless the authorization is terminated or revoked sooner. Performed at [facility], [place] [address] [place], [place] [address]      Labs: BNP (last 3 results) No results for input(s): BNP in the last 8760 hours. Basic Metabolic Panel: Recent Labs  Lab 09/28/19 1012 09/29/19 0405 09/30/19 0356 10/01/19 0444 10/02/19 0441  NA 139 138 137 137 138  K 4.6 5.3* 5.1 4.9 4.8  CL 108 107 107 108 109  CO2 21* 20* 20* 19* 19*  GLUCOSE 192*  160* 175* 176* 168*  BUN 47* 50* 56* 57* 57*  CREATININE 2.21* 2.57* 2.95* 2.66* 2.42*  CALCIUM 7.3* 7.5* 7.7* 7.7* 7.9*  PHOS 4.5 5.2* 5.7* 5.1* 4.9*   Liver Function Tests: Recent Labs  Lab 09/28/19 1012 09/29/19 0405 09/30/19 0356 10/01/19 0444 10/02/19 0441  ALBUMIN 2.2* 2.3* 2.4* 2.3* 2.3*   No results for input(s): LIPASE, AMYLASE in the last 168 hours. No results for input(s): AMMONIA in the last 168  hours. CBC: Recent Labs  Lab 09/26/19 0424 09/29/19 0405 10/01/19 0444  WBC 7.2 5.7 4.2  HGB 8.3* 8.0* 8.4*  HCT 26.7* 26.8* 27.1*  MCV 91.8 93.1 92.8  PLT 186 174 143*   Cardiac Enzymes: No results for input(s): CKTOTAL, CKMB, CKMBINDEX, TROPONINI in the last 168 hours. BNP: Invalid input(s): POCBNP CBG: Recent Labs  Lab 10/01/19 0642 10/01/19 1206 10/01/19 1757 10/01/19 2210 10/02/19 0618  GLUCAP 167* 180* 153* 133* 178*   D-Dimer No results for input(s): DDIMER in the last 72 hours. Hgb A1c No results for input(s): HGBA1C in the last 72 hours. Lipid Profile No results for input(s): CHOL, HDL, LDLCALC, TRIG, CHOLHDL, LDLDIRECT in the last 72 hours. Thyroid function studies No results for input(s): TSH, T4TOTAL, T3FREE, THYROIDAB in the last 72 hours.  Invalid input(s): FREET3 Anemia work up No results for input(s): VITAMINB12, FOLATE, FERRITIN, TIBC, IRON, RETICCTPCT in the last 72 hours. Urinalysis    Component Value Date/Time   COLORURINE AMBER (A) 09/15/2019 1425   APPEARANCEUR TURBID (A) 09/15/2019 1425   LABSPEC 1.012 09/15/2019 1425   PHURINE 5.0 09/15/2019 1425   GLUCOSEU NEGATIVE 09/15/2019 1425   HGBUR MODERATE (A) 09/15/2019 1425   BILIRUBINUR NEGATIVE 09/15/2019 1425   KETONESUR NEGATIVE 09/15/2019 1425   PROTEINUR 30 (A) 09/15/2019 1425   UROBILINOGEN 0.2 10/02/2015 2320   NITRITE NEGATIVE 09/15/2019 1425   LEUKOCYTESUR LARGE (A) 09/15/2019 1425   Sepsis Labs Invalid input(s): PROCALCITONIN,  WBC,  LACTICIDVEN Microbiology Recent Results (from the past 240 hour(s))  SARS CORONAVIRUS 2 (TAT 6-24 HRS) Nasopharyngeal Nasopharyngeal Swab     Status: None   Collection Time: 09/25/19  5:00 PM   Specimen: Nasopharyngeal Swab  Result Value Ref Range Status   SARS Coronavirus 2 NEGATIVE NEGATIVE Final    Comment: (NOTE) SARS-CoV-2 target nucleic acids are NOT DETECTED. The SARS-CoV-2 RNA is generally detectable in upper and lower respiratory  specimens during the acute phase of infection. Negative results do not preclude SARS-CoV-2 infection, do not rule out co-infections with other pathogens, and should not be used as the sole basis for treatment or other patient management decisions. Negative results must be combined with clinical observations, patient history, and epidemiological information. The expected result is Negative. Fact Sheet for Patients: SugarRoll.be Fact Sheet for Healthcare Providers: https://www.woods-mathews.com/ This test is not yet approved or cleared by the [place] FDA and  has been authorized for detection and/or diagnosis of SARS-CoV-2 by FDA under an Emergency Use Authorization (EUA). This EUA will remain  in effect (meaning this test can be used) for the duration of the COVID-19 declaration under Section 56 4(b)(1) of the Act, 21 U.S.C. section 360bbb-3(b)(1), unless the authorization is terminated or revoked sooner. Performed at [facility], [place] [address] [place], [place] [address]   SARS CORONAVIRUS 2 (TAT 6-24 HRS) Nasopharyngeal Nasopharyngeal Swab     Status: None   Collection Time: 09/29/19  9:28 AM   Specimen: Nasopharyngeal Swab  Result Value Ref Range Status  SARS Coronavirus 2 NEGATIVE NEGATIVE Final    Comment: (NOTE) SARS-CoV-2 target nucleic acids are NOT DETECTED. The SARS-CoV-2 RNA is generally detectable in upper and lower respiratory specimens during the acute phase of infection. Negative results do not preclude SARS-CoV-2 infection, do not rule out co-infections with other pathogens, and should not be used as the sole basis for treatment or other patient management decisions. Negative results must be combined with clinical observations, patient history, and epidemiological information. The expected result is Negative. Fact Sheet for Patients: SugarRoll.be Fact Sheet for Healthcare  Providers: https://www.woods-mathews.com/ This test is not yet approved or cleared by the [place] FDA and  has been authorized for detection and/or diagnosis of SARS-CoV-2 by FDA under an Emergency Use Authorization (EUA). This EUA will remain  in effect (meaning this test can be used) for the duration of the COVID-19 declaration under Section 56 4(b)(1) of the Act, 21 U.S.C. section 360bbb-3(b)(1), unless the authorization is terminated or revoked sooner. Performed at [facility], [place] [address], [place], [place] [address]      Patient was seen and examined on the day of discharge and was found to be in stable condition. Time coordinating discharge: 40 minutes including assessment and coordination of care, as well as examination of the patient.   SIGNED:  Dessa Phi, DO [facility] 10/02/2019, 9:27 AM

## 2019-10-01 NOTE — Progress Notes (Addendum)
PROGRESS NOTE    Maureen Jackson  NKN:[id number] DOB: 05/17/1956 DOA: 09/08/2019 PCP: Neale Burly, MD     Brief Narrative:  Maureen Jackson is a 63 year old with past medical history significant for morbid obesity, systolic heart failure, hypothyroidism, hypertension, CAD and obstructive sleep apnea.  Presents with  generalized weakness, difficulty voiding, poor appetite and insomnia for 3 days.  September 26,  2 days prior to hospitalization she was seen in the emergency department for left labia upper thigh abscess which was drained.  Discharged home on oral antibiotics.  Patient had worsening renal function in the setting of concern for Fournier gangrene, thus patient was transferred from [facility] to [facility] for further management. Repeat CT scan done on 10/5 findings worrisome for Fournier gangrene. Patient was taken to the OR by general surgery on 10/6. Was also noted to be in an acute HF exacerbation. Nephrology was consulted and patient was a started on hemodialysis on 10/6 and had HD on 10/8.   Nephro signed off 10/13 with improvement in urine output and volume status for patient and recurrence of AKI and this was discussed with nephro with continuation of Lasix, spironolactone bladder scan/straight cath protocol. Transferred to med-surg bed 10/19.  Patient currently awaiting placement to SNF.  New events last 24 hours / Subjective: No new complaints this morning.  Tolerating dressing changes well, continues to require assistance with twice daily dressing changes.  I have called peer to peer review line twice and still awaiting call back.   Assessment & Plan:   Principal Problem:   Fournier's gangrene in female Active Problems:   Hypothyroidism   Essential hypertension   Morbid obesity/BMI > 55   Diabetes mellitus type 2 with complications, uncontrolled (HCC)   CAD, multiple vessel   Ischemic cardiomyopathy   Chronic combined systolic and diastolic CHF (congestive heart  failure) (EF 30 to 35 %)   CAP (community acquired pneumonia)   Labial abscess   Abscess   Fistula   AMS (altered mental status)   Drug rash   Acute renal failure (ARF) (HCC)   Pressure injury of skin   Labial abscess and Fournier gangrene complicated with severe sepsis with acute metabolic encephalopathy -S/p incision and debridment of mons soft tissue infection and spontaneously drainingleft labial abscess, 10/6 Dr. Ninfa Linden -She has now completed course of antibiotics  -General surgery recommending BID dressing changes and to follow-up in office with Dr. Ninfa Linden in 3 to 4 weeks for wound check -Change dressing at mons site using wet-to-dry Kerlix dressing and cover with ABD pad over site twice daily. Dry dressing changes on labial abscess twice daily as needed  AKI -Presumed to be combination of ischemic ATN as well as vancomycin nephrotoxicity.  Has required HD during hospitalization, now off  -Continue holding home Entresto, spironolactone until renal function improves -Appreciate nephrology, plan to follow-up with them in the office with follow-up BMP.  Nephrology signed off 10/21  Acute on chronic systolic heart failure exacerbation -Resolved -Continue carvedilol, Lasix.  Spironolactone and Entresto on hold due to renal dysfunction  CAD -Continue aspirin/plavix, Lipitor  Diabetes -Continue with sliding scale insulin  Morbid obesity with OSA -CPAP nightly -Estimated body mass index is 57.36 kg/m as calculated from the following:   Height as of this encounter: 5\' 5"  (1.651 m).   Weight as of this encounter: 156.4 kg.  Hypothyroidism  -Continue Synthroid  Acute metabolic encephalopathy -Resolved    In agreement with assessment of the pressure ulcer  as below:  Pressure Injury 09/20/19 Buttocks Left Stage II -  Partial thickness loss of dermis presenting as a shallow open ulcer with a red, pink wound bed without slough. (Active)  09/20/19 1302  Location:  Buttocks  Location Orientation: Left  Staging: Stage II -  Partial thickness loss of dermis presenting as a shallow open ulcer with a red, pink wound bed without slough.  Wound Description (Comments):   Present on Admission:      Nutrition Problem: Inadequate oral intake Etiology: acute illness(pneumonia; abscess in left labia/upper thigh area)   DVT prophylaxis: Subq heparin  Code Status: Full code Family Communication: None Disposition Plan: Awaiting SNF placement    Antimicrobials:  Anti-infectives (From admission, onward)   Start     Dose/Rate Route Frequency Ordered Stop   09/19/19 1600  levofloxacin (LEVAQUIN) tablet 500 mg     500 mg Oral Every 48 hours 09/18/19 1118 09/25/19 1814   09/17/19 1600  levofloxacin (LEVAQUIN) tablet 750 mg  Status:  Discontinued     750 mg Oral Every 48 hours 09/17/19 1100 09/18/19 1118   09/17/19 1600  doxycycline (VIBRA-TABS) tablet 100 mg     100 mg Oral Every 12 hours 09/17/19 1100 09/26/19 2359   09/16/19 1600  linezolid (ZYVOX) IVPB 600 mg  Status:  Discontinued     600 mg 300 mL/hr over 60 Minutes Intravenous Every 12 hours 09/16/19 1556 09/17/19 1100   09/12/19 1100  linezolid (ZYVOX) IVPB 600 mg  Status:  Discontinued     600 mg 300 mL/hr over 60 Minutes Intravenous Every 12 hours 09/12/19 0945 09/16/19 1556   09/12/19 1100  Ampicillin-Sulbactam (UNASYN) 3 g in sodium chloride 0.9 % 100 mL IVPB  Status:  Discontinued     3 g 200 mL/hr over 30 Minutes Intravenous Every 12 hours 09/12/19 0945 09/17/19 1100   09/10/19 1430  clindamycin (CLEOCIN) IVPB 600 mg  Status:  Discontinued     600 mg 100 mL/hr over 30 Minutes Intravenous Every 6 hours 09/10/19 1410 09/12/19 0945   09/10/19 0742  vancomycin variable dose per unstable renal function (pharmacist dosing)  Status:  Discontinued      Does not apply See admin instructions 09/10/19 0742 09/10/19 1413   09/10/19 0000  vancomycin (VANCOCIN) 1,750 mg in sodium chloride 0.9 % 500 mL IVPB   Status:  Discontinued     1,750 mg 250 mL/hr over 120 Minutes Intravenous Every 24 hours 09/09/19 0912 09/09/19 0928   09/10/19 0000  vancomycin (VANCOCIN) 1,750 mg in sodium chloride 0.9 % 500 mL IVPB  Status:  Discontinued     1,750 mg 250 mL/hr over 120 Minutes Intravenous Every 24 hours 09/09/19 0940 09/10/19 0751   09/09/19 1030  vancomycin (VANCOCIN) IVPB 750 mg/150 ml premix  Status:  Discontinued     750 mg 150 mL/hr over 60 Minutes Intravenous  Once 09/09/19 0928 09/09/19 0938   09/09/19 0930  vancomycin (VANCOCIN) IVPB 1000 mg/200 mL premix  Status:  Discontinued     1,000 mg 200 mL/hr over 60 Minutes Intravenous  Once 09/09/19 0928 09/09/19 0938   09/08/19 2300  azithromycin (ZITHROMAX) 500 mg in sodium chloride 0.9 % 250 mL IVPB  Status:  Discontinued     500 mg 250 mL/hr over 60 Minutes Intravenous Every 24 hours 09/08/19 2230 09/12/19 0945   09/08/19 2300  cefTRIAXone (ROCEPHIN) 2 g in sodium chloride 0.9 % 100 mL IVPB  Status:  Discontinued     2 g  200 mL/hr over 30 Minutes Intravenous Every 24 hours 09/08/19 2230 09/12/19 0945   09/08/19 2300  vancomycin (VANCOCIN) 1,500 mg in sodium chloride 0.9 % 500 mL IVPB     1,500 mg 250 mL/hr over 120 Minutes Intravenous  Once 09/08/19 2254 09/09/19 0434   09/08/19 1600  vancomycin (VANCOCIN) IVPB 1000 mg/200 mL premix     1,000 mg 200 mL/hr over 60 Minutes Intravenous  Once 09/08/19 1558 09/08/19 1929   09/08/19 1600  piperacillin-tazobactam (ZOSYN) IVPB 3.375 g     3.375 g 100 mL/hr over 30 Minutes Intravenous  Once 09/08/19 1558 09/08/19 1655        Objective: Vitals:   09/30/19 2009 10/01/19 0058 10/01/19 0531 10/01/19 0655  BP: (!) 105/46 (!) 119/53 (!) 142/56   Pulse: 75 83 82   Resp: 18 18 18    Temp: 98 F (36.7 C) 97.6 F (36.4 C) 98.3 F (36.8 C)   TempSrc: Oral Oral Oral   SpO2: 99% 98% 98%   Weight:    (!) 156.4 kg  Height:        Intake/Output Summary (Last 24 hours) at 10/01/2019 1449 Last data  filed at 10/01/2019 1332 Gross per 24 hour  Intake 813 ml  Output 800 ml  Net 13 ml   Filed Weights   09/29/19 0626 09/30/19 0224 10/01/19 0655  Weight: (!) 153.1 kg (!) 156.7 kg (!) 156.4 kg    Examination:  General exam: Appears calm and comfortable  Respiratory system: Clear to auscultation. Respiratory effort normal. No respiratory distress. No conversational dyspnea.  Cardiovascular system: S1 & S2 heard, RRR. No murmurs. Gastrointestinal system: Abdomen is nondistended, soft and nontender. Normal bowel sounds heard. Central nervous system: Alert and oriented. No focal neurological deficits. Speech clear.  Extremities: Symmetric in appearance  Psychiatry: Judgement and insight appear normal. Mood & affect appropriate.   Data Reviewed: I have personally reviewed following labs and imaging studies  CBC: Recent Labs  Lab 09/25/19 0702 09/26/19 0424 09/29/19 0405 10/01/19 0444  WBC 7.5 7.2 5.7 4.2  HGB 8.3* 8.3* 8.0* 8.4*  HCT 27.7* 26.7* 26.8* 27.1*  MCV 92.0 91.8 93.1 92.8  PLT 207 186 174 096*   Basic Metabolic Panel: Recent Labs  Lab 09/27/19 0904 09/28/19 1012 09/29/19 0405 09/30/19 0356 10/01/19 0444  NA 138 139 138 137 137  K 4.2 4.6 5.3* 5.1 4.9  CL 108 108 107 107 108  CO2 19* 21* 20* 20* 19*  GLUCOSE 221* 192* 160* 175* 176*  BUN 45* 47* 50* 56* 57*  CREATININE 2.39* 2.21* 2.57* 2.95* 2.66*  CALCIUM 7.3* 7.3* 7.5* 7.7* 7.7*  PHOS 4.0 4.5 5.2* 5.7* 5.1*   GFR: Estimated Creatinine Clearance: 33.9 mL/min (A) (by C-G formula based on SCr of 2.66 mg/dL (H)). Liver Function Tests: Recent Labs  Lab 09/27/19 0904 09/28/19 1012 09/29/19 0405 09/30/19 0356 10/01/19 0444  ALBUMIN 2.2* 2.2* 2.3* 2.4* 2.3*   No results for input(s): LIPASE, AMYLASE in the last 168 hours. No results for input(s): AMMONIA in the last 168 hours. Coagulation Profile: No results for input(s): INR, PROTIME in the last 168 hours. Cardiac Enzymes: No results for input(s):  CKTOTAL, CKMB, CKMBINDEX, TROPONINI in the last 168 hours. BNP (last 3 results) No results for input(s): PROBNP in the last 8760 hours. HbA1C: No results for input(s): HGBA1C in the last 72 hours. CBG: Recent Labs  Lab 09/30/19 1152 09/30/19 1618 09/30/19 2103 10/01/19 0642 10/01/19 1206  GLUCAP 179* 182* 237* 167*  180*   Lipid Profile: No results for input(s): CHOL, HDL, LDLCALC, TRIG, CHOLHDL, LDLDIRECT in the last 72 hours. Thyroid Function Tests: No results for input(s): TSH, T4TOTAL, FREET4, T3FREE, THYROIDAB in the last 72 hours. Anemia Panel: No results for input(s): VITAMINB12, FOLATE, FERRITIN, TIBC, IRON, RETICCTPCT in the last 72 hours. Sepsis Labs: No results for input(s): PROCALCITON, LATICACIDVEN in the last 168 hours.  Recent Results (from the past 240 hour(s))  SARS CORONAVIRUS 2 (TAT 6-24 HRS) Nasopharyngeal Nasopharyngeal Swab     Status: None   Collection Time: 09/25/19  5:00 PM   Specimen: Nasopharyngeal Swab  Result Value Ref Range Status   SARS Coronavirus 2 NEGATIVE NEGATIVE Final    Comment: (NOTE) SARS-CoV-2 target nucleic acids are NOT DETECTED. The SARS-CoV-2 RNA is generally detectable in upper and lower respiratory specimens during the acute phase of infection. Negative results do not preclude SARS-CoV-2 infection, do not rule out co-infections with other pathogens, and should not be used as the sole basis for treatment or other patient management decisions. Negative results must be combined with clinical observations, patient history, and epidemiological information. The expected result is Negative. Fact Sheet for Patients: SugarRoll.be Fact Sheet for Healthcare Providers: https://www.woods-mathews.com/ This test is not yet approved or cleared by the [place] FDA and  has been authorized for detection and/or diagnosis of SARS-CoV-2 by FDA under an Emergency Use Authorization (EUA). This EUA will  remain  in effect (meaning this test can be used) for the duration of the COVID-19 declaration under Section 56 4(b)(1) of the Act, 21 U.S.C. section 360bbb-3(b)(1), unless the authorization is terminated or revoked sooner. Performed at [facility], [place] [address] [place], [place] [address]   SARS CORONAVIRUS 2 (TAT 6-24 HRS) Nasopharyngeal Nasopharyngeal Swab     Status: None   Collection Time: 09/29/19  9:28 AM   Specimen: Nasopharyngeal Swab  Result Value Ref Range Status   SARS Coronavirus 2 NEGATIVE NEGATIVE Final    Comment: (NOTE) SARS-CoV-2 target nucleic acids are NOT DETECTED. The SARS-CoV-2 RNA is generally detectable in upper and lower respiratory specimens during the acute phase of infection. Negative results do not preclude SARS-CoV-2 infection, do not rule out co-infections with other pathogens, and should not be used as the sole basis for treatment or other patient management decisions. Negative results must be combined with clinical observations, patient history, and epidemiological information. The expected result is Negative. Fact Sheet for Patients: SugarRoll.be Fact Sheet for Healthcare Providers: https://www.woods-mathews.com/ This test is not yet approved or cleared by the [place] FDA and  has been authorized for detection and/or diagnosis of SARS-CoV-2 by FDA under an Emergency Use Authorization (EUA). This EUA will remain  in effect (meaning this test can be used) for the duration of the COVID-19 declaration under Section 56 4(b)(1) of the Act, 21 U.S.C. section 360bbb-3(b)(1), unless the authorization is terminated or revoked sooner. Performed at [facility], [place] [address] [place], [place] [address]       Radiology Studies: No results found.    Scheduled Meds: . aspirin EC  81 mg Oral Daily  . atorvastatin  80 mg Oral QPM  . carvedilol  6.25 mg Oral BID WC  . clopidogrel  75 mg  Oral q morning - 10a  . darbepoetin (ARANESP) injection - NON-DIALYSIS  100 mcg Subcutaneous Q Mon-1800  . furosemide  40 mg Oral Daily  . heparin injection (subcutaneous)  5,000 Units Subcutaneous Q8H  . insulin aspart  0-9 Units Subcutaneous TID WC  . levothyroxine  125 mcg Oral Q0600  . multivitamin  1 tablet Oral QHS  . nystatin   Topical TID  . saccharomyces boulardii  250 mg Oral BID  . sodium chloride flush  10-40 mL Intracatheter Q12H   Continuous Infusions: . sodium chloride       LOS: 23 days      Time spent: 40 minutes   Dessa Phi, DO [facility] 10/01/2019, 2:49 PM   Available via Epic secure chat 7am-7pm After these hours, please refer to coverage provider listed on [email or web address]

## 2019-10-01 NOTE — Progress Notes (Signed)
Patient ID: Maureen Jackson, female   DOB: [birth year], 63 y.o.   MRN: [id number] S: Feels well, no complaints.  900 uop- crt down some ! C/o some incontinence   O:BP (!) 142/56 (BP Location: Left Arm)   Pulse 82   Temp 98.3 F (36.8 C) (Oral)   Resp 18   Ht 5\' 5"  (1.651 m)   Wt (!) 156.4 kg   SpO2 98%   BMI 57.36 kg/m   Intake/Output Summary (Last 24 hours) at 10/01/2019 0759 Last data filed at 10/01/2019 0600 Gross per 24 hour  Intake 1053 ml  Output 900 ml  Net 153 ml   Intake/Output: I/O last 3 completed shifts: In: 7209 [P.O.:1413] Out: 1575 [Urine:1575]  Intake/Output this shift:  No intake/output data recorded. Weight change: -0.363 kg Gen: obese AAF in NAD  CVS: no rub Resp: cta Abd: obese, +BS, soft, mildly tender Ext:  mild pretibial edema  Recent Labs  Lab 09/25/19 0702 09/26/19 0424 09/27/19 0904 09/28/19 1012 09/29/19 0405 09/30/19 0356 10/01/19 0444  NA 142 140 138 139 138 137 137  K 3.9 4.1 4.2 4.6 5.3* 5.1 4.9  CL 110 109 108 108 107 107 108  CO2 21* 20* 19* 21* 20* 20* 19*  GLUCOSE 163* 217* 221* 192* 160* 175* 176*  BUN 36* 40* 45* 47* 50* 56* 57*  CREATININE 2.04* 2.55* 2.39* 2.21* 2.57* 2.95* 2.66*  ALBUMIN 2.0* 2.1* 2.2* 2.2* 2.3* 2.4* 2.3*  CALCIUM 7.4* 7.4* 7.3* 7.3* 7.5* 7.7* 7.7*  PHOS 2.8 3.4 4.0 4.5 5.2* 5.7* 5.1*   Liver Function Tests: Recent Labs  Lab 09/29/19 0405 09/30/19 0356 10/01/19 0444  ALBUMIN 2.3* 2.4* 2.3*   No results for input(s): LIPASE, AMYLASE in the last 168 hours. No results for input(s): AMMONIA in the last 168 hours. CBC: Recent Labs  Lab 09/25/19 0702 09/26/19 0424 09/29/19 0405 10/01/19 0444  WBC 7.5 7.2 5.7 4.2  HGB 8.3* 8.3* 8.0* 8.4*  HCT 27.7* 26.7* 26.8* 27.1*  MCV 92.0 91.8 93.1 92.8  PLT 207 186 174 143*   Cardiac Enzymes: No results for input(s): CKTOTAL, CKMB, CKMBINDEX, TROPONINI in the last 168 hours. CBG: Recent Labs  Lab 09/30/19 0538 09/30/19 1152 09/30/19 1618  09/30/19 2103 10/01/19 0642  GLUCAP 169* 179* 182* 237* 167*    Iron Studies: No results for input(s): IRON, TIBC, TRANSFERRIN, FERRITIN in the last 72 hours. Studies/Results: No results found. Marland Kitchen aspirin EC  81 mg Oral Daily  . atorvastatin  80 mg Oral QPM  . carvedilol  6.25 mg Oral BID WC  . clopidogrel  75 mg Oral q morning - 10a  . darbepoetin (ARANESP) injection - NON-DIALYSIS  100 mcg Subcutaneous Q Mon-1800  . furosemide  40 mg Oral Daily  . heparin injection (subcutaneous)  5,000 Units Subcutaneous Q8H  . insulin aspart  0-9 Units Subcutaneous TID WC  . levothyroxine  125 mcg Oral Q0600  . multivitamin  1 tablet Oral QHS  . nystatin   Topical TID  . saccharomyces boulardii  250 mg Oral BID  . sodium chloride flush  10-40 mL Intracatheter Q12H    BMET    Component Value Date/Time   NA 137 10/01/2019 0444   K 4.9 10/01/2019 0444   CL 108 10/01/2019 0444   CO2 19 (L) 10/01/2019 0444   GLUCOSE 176 (H) 10/01/2019 0444   BUN 57 (H) 10/01/2019 0444   CREATININE 2.66 (H) 10/01/2019 0444   CALCIUM 7.7 (L) 10/01/2019 0444  GFRNONAA 19 (L) 10/01/2019 0444   GFRAA 22 (L) 10/01/2019 0444   CBC    Component Value Date/Time   WBC 4.2 10/01/2019 0444   RBC 2.92 (L) 10/01/2019 0444   HGB 8.4 (L) 10/01/2019 0444   HCT 27.1 (L) 10/01/2019 0444   PLT 143 (L) 10/01/2019 0444   MCV 92.8 10/01/2019 0444   MCH 28.8 10/01/2019 0444   MCHC 31.0 10/01/2019 0444   RDW 17.0 (H) 10/01/2019 0444   LYMPHSABS 2.2 09/17/2019 0947   MONOABS 0.8 09/17/2019 0947   EOSABS 0.6 (H) 09/17/2019 0947   BASOSABS 0.0 09/17/2019 0947    Assessment/Plan:  1. AKI- presumably due to combination of ischemic ATN as well as vancomycin nephrotoxicity. She underwent HD on 10/6 and again on 09/18/19 with nontunneled HD catheterwhich was removed 09/23/19. Since then, her UOP improved as well as her BUN/Crwe signed off but then some variability of renal function maybe related to some transient BOO and  hypotension, hopefully now on the mend again.  I would keep her on the low dose coreg to avoid hypotension and I have educated her regarding signs that would show some BOO.   2. Cont to hold entresto but ok to resume po lasix  3. From a kidney standpoint, I would say OK for discharge.  She will follow up with her PCP-  At the very least I will arrange labs to make sure AKI still resolving- if not, will arrange appt at [facility] 2. Left labial abscess with sepsis- improving with antibiotics and wound care/drainage. 3. AMS- now resolved. 4. Anemia- due to acute/critical illness. Aranesp started on 09/15/19- will inc dose and s/p feraheme. Hgb stable but poor . Transfuse prn. Will not need to be discharged on ESA  Renal will sign off, call with concerns   [place] [phone]

## 2019-10-01 NOTE — Progress Notes (Signed)
PT Cancellation Note  Patient Details Name: Maureen Jackson MRN: [id number] DOB: Dec 19, 1955   Cancelled Treatment:    Reason Eval/Treat Not Completed: Patient declined, no reason specified; patient relates fatigue and plans for d/c to SNF today.  Will check back later as time permits.    [name] 10/01/2019, 10:22 AM  [name], [place] [phone] 10/01/2019

## 2019-10-01 NOTE — Care Management Important Message (Signed)
Important Message  Patient Details  Name: Maureen Jackson MRN: [id number] Date of Birth: January 22, 1956   Medicare Important Message Given:  Yes     Shelda Altes 10/01/2019, 11:38 AM

## 2019-10-01 NOTE — Progress Notes (Addendum)
Changed Dressing this evening-  Patient tolerated well.  Gave tramadol prior to dressing change.   Per patient - no pain noted during change.   Patient wound measurements showed up under pressure injury on buttocks.    Measurements for mons site (also place in notes) in the incision documentation  Length- 6.5; width 8; depth 7.2 Surface area 52; vol Cm3 374

## 2019-10-01 NOTE — Progress Notes (Signed)
Physical Therapy Treatment Patient Details Name: Maureen Jackson MRN: [id number] DOB: Feb 28, 1956 Today's Date: 10/01/2019    History of Present Illness Pt is a 63 y.o. female admitted 09/08/19 with weakness, difficulty voiding and poor appetite. Had recent I&D of left labia upper thigh abscess in ED on 9/26. Pt with worsening renal function. CT 10/5 with concern for Fournier gangrene. S/p RIJ non-tunneled cath placement. S/p I&D of labia abscess 10/6. PMH includes morbid obesity, HF, HTN, CAD, OSA.    PT Comments    Patient initially refusing, but encouraged to participate to help get her home sooner and stronger.  She was able to utilize the rollator with assist for resting in hallway so safely with +1 A as no need for chair follow.  Feel she will benefit from SNF level rehab as not appropriate for CIR as family cannot support her wound care at home.  PT to follow.    Follow Up Recommendations  SNF     Equipment Recommendations  None recommended by PT    Recommendations for Other Services       Precautions / Restrictions Precautions Precautions: Fall Precaution Comments: abscess L thigh & labia    Mobility  Bed Mobility               General bed mobility comments: up in chair  Transfers Overall transfer level: Needs assistance Equipment used: 4-wheeled walker Transfers: Sit to/from Stand Sit to Stand: Min assist         General transfer comment: cues for hand placement  Ambulation/Gait Ambulation/Gait assistance: Min assist Gait Distance (Feet): 65 Feet(x 2) Assistive device: 4-wheeled walker Gait Pattern/deviations: Step-through pattern;Decreased stride length;Drifts right/left;Wide base of support     General Gait Details: used rollator to practice using seat; able to sit in hallway with assit for safety due to brakes not working well and cues for pushing it up against the wall; tendency to move walker side to side with ambulation due to weakness/body habitus  and mild antalgia L LE   Stairs             Wheelchair Mobility    Modified Rankin (Stroke Patients Only)       Balance Overall balance assessment: Needs assistance Sitting-balance support: No upper extremity supported;Feet supported Sitting balance-Leahy Scale: Fair Sitting balance - Comments: on edge of chair no UE support   Standing balance support: Bilateral upper extremity supported;Single extremity supported Standing balance-Leahy Scale: Poor Standing balance comment: LOB transitioning from sitting on walker to standing and getting walker in front min A to recover                            Cognition Arousal/Alertness: Awake/alert Behavior During Therapy: WFL for tasks assessed/performed Overall Cognitive Status: Within Functional Limits for tasks assessed                                        Exercises      General Comments General comments (skin integrity, edema, etc.): VSS with ambulation      Pertinent Vitals/Pain Faces Pain Scale: Hurts a little bit Pain Location: L thigh Pain Descriptors / Indicators: Discomfort;Guarding Pain Intervention(s): Monitored during session;Repositioned    Home Living                      Prior Function  PT Goals (current goals can now be found in the care plan section) Acute Rehab PT Goals Patient Stated Goal: to go home PT Goal Formulation: With patient Time For Goal Achievement: 10/15/19 Potential to Achieve Goals: Good Progress towards PT goals: Progressing toward goals    Frequency    Min 3X/week      PT Plan Discharge plan needs to be updated    Co-evaluation              AM-PAC PT "6 Clicks" Mobility   Outcome Measure  Help needed turning from your back to your side while in a flat bed without using bedrails?: A Little Help needed moving from lying on your back to sitting on the side of a flat bed without using bedrails?: A Little Help  needed moving to and from a bed to a chair (including a wheelchair)?: A Little Help needed standing up from a chair using your arms (e.g., wheelchair or bedside chair)?: A Little Help needed to walk in hospital room?: A Little Help needed climbing 3-5 steps with a railing? : Total 6 Click Score: 16    End of Session   Activity Tolerance: Patient limited by fatigue Patient left: with call bell/phone within reach;in chair;with chair alarm set(feet elevated due to pt relates tightness wtih feet down all morning.)   PT Visit Diagnosis: Other abnormalities of gait and mobility (R26.89);Muscle weakness (generalized) (M62.81)     Time: 0712-1975 PT Time Calculation (min) (ACUTE ONLY): 13 min  Charges:  $Gait Training: 8-22 mins                     Maureen Jackson, [place] [phone] 10/01/2019    Maureen Jackson 10/01/2019, 2:09 PM

## 2019-10-02 DIAGNOSIS — Z7902 Long term (current) use of antithrombotics/antiplatelets: Secondary | ICD-10-CM | POA: Diagnosis not present

## 2019-10-02 DIAGNOSIS — Z79899 Other long term (current) drug therapy: Secondary | ICD-10-CM | POA: Diagnosis not present

## 2019-10-02 DIAGNOSIS — Z043 Encounter for examination and observation following other accident: Secondary | ICD-10-CM | POA: Diagnosis not present

## 2019-10-02 DIAGNOSIS — N7689 Other specified inflammation of vagina and vulva: Secondary | ICD-10-CM | POA: Diagnosis not present

## 2019-10-02 DIAGNOSIS — Z0000 Encounter for general adult medical examination without abnormal findings: Secondary | ICD-10-CM | POA: Diagnosis not present

## 2019-10-02 DIAGNOSIS — Y9389 Activity, other specified: Secondary | ICD-10-CM | POA: Diagnosis not present

## 2019-10-02 DIAGNOSIS — R262 Difficulty in walking, not elsewhere classified: Secondary | ICD-10-CM | POA: Diagnosis not present

## 2019-10-02 DIAGNOSIS — Y92193 Bedroom in other specified residential institution as the place of occurrence of the external cause: Secondary | ICD-10-CM | POA: Diagnosis not present

## 2019-10-02 DIAGNOSIS — I110 Hypertensive heart disease with heart failure: Secondary | ICD-10-CM | POA: Diagnosis not present

## 2019-10-02 DIAGNOSIS — R58 Hemorrhage, not elsewhere classified: Secondary | ICD-10-CM | POA: Diagnosis not present

## 2019-10-02 DIAGNOSIS — J189 Pneumonia, unspecified organism: Secondary | ICD-10-CM | POA: Diagnosis not present

## 2019-10-02 DIAGNOSIS — Z7982 Long term (current) use of aspirin: Secondary | ICD-10-CM | POA: Diagnosis not present

## 2019-10-02 DIAGNOSIS — M6281 Muscle weakness (generalized): Secondary | ICD-10-CM | POA: Diagnosis not present

## 2019-10-02 DIAGNOSIS — W19XXXA Unspecified fall, initial encounter: Secondary | ICD-10-CM | POA: Diagnosis not present

## 2019-10-02 DIAGNOSIS — R52 Pain, unspecified: Secondary | ICD-10-CM | POA: Diagnosis not present

## 2019-10-02 DIAGNOSIS — R279 Unspecified lack of coordination: Secondary | ICD-10-CM | POA: Diagnosis not present

## 2019-10-02 DIAGNOSIS — Z743 Need for continuous supervision: Secondary | ICD-10-CM | POA: Diagnosis not present

## 2019-10-02 DIAGNOSIS — R29898 Other symptoms and signs involving the musculoskeletal system: Secondary | ICD-10-CM | POA: Diagnosis not present

## 2019-10-02 DIAGNOSIS — Y990 Civilian activity done for income or pay: Secondary | ICD-10-CM | POA: Diagnosis not present

## 2019-10-02 DIAGNOSIS — R69 Illness, unspecified: Secondary | ICD-10-CM | POA: Diagnosis not present

## 2019-10-02 DIAGNOSIS — M2550 Pain in unspecified joint: Secondary | ICD-10-CM | POA: Diagnosis not present

## 2019-10-02 DIAGNOSIS — I499 Cardiac arrhythmia, unspecified: Secondary | ICD-10-CM | POA: Diagnosis not present

## 2019-10-02 DIAGNOSIS — R4182 Altered mental status, unspecified: Secondary | ICD-10-CM | POA: Diagnosis not present

## 2019-10-02 DIAGNOSIS — E039 Hypothyroidism, unspecified: Secondary | ICD-10-CM | POA: Diagnosis not present

## 2019-10-02 DIAGNOSIS — Z7401 Bed confinement status: Secondary | ICD-10-CM | POA: Diagnosis not present

## 2019-10-02 DIAGNOSIS — T7601XA Adult neglect or abandonment, suspected, initial encounter: Secondary | ICD-10-CM | POA: Diagnosis not present

## 2019-10-02 DIAGNOSIS — Z741 Need for assistance with personal care: Secondary | ICD-10-CM | POA: Diagnosis not present

## 2019-10-02 DIAGNOSIS — I5042 Chronic combined systolic (congestive) and diastolic (congestive) heart failure: Secondary | ICD-10-CM | POA: Diagnosis not present

## 2019-10-02 DIAGNOSIS — R2689 Other abnormalities of gait and mobility: Secondary | ICD-10-CM | POA: Diagnosis not present

## 2019-10-02 DIAGNOSIS — R5381 Other malaise: Secondary | ICD-10-CM | POA: Diagnosis not present

## 2019-10-02 DIAGNOSIS — S0990XA Unspecified injury of head, initial encounter: Secondary | ICD-10-CM | POA: Diagnosis not present

## 2019-10-02 DIAGNOSIS — N179 Acute kidney failure, unspecified: Secondary | ICD-10-CM | POA: Diagnosis not present

## 2019-10-02 DIAGNOSIS — W06XXXA Fall from bed, initial encounter: Secondary | ICD-10-CM | POA: Diagnosis not present

## 2019-10-02 DIAGNOSIS — Z87891 Personal history of nicotine dependence: Secondary | ICD-10-CM | POA: Diagnosis not present

## 2019-10-02 DIAGNOSIS — E119 Type 2 diabetes mellitus without complications: Secondary | ICD-10-CM | POA: Diagnosis not present

## 2019-10-02 LAB — RENAL FUNCTION PANEL
Albumin: 2.3 g/dL — ABNORMAL LOW (ref 3.5–5.0)
Anion gap: 10 (ref 5–15)
BUN: 57 mg/dL — ABNORMAL HIGH (ref 8–23)
CO2: 19 mmol/L — ABNORMAL LOW (ref 22–32)
Calcium: 7.9 mg/dL — ABNORMAL LOW (ref 8.9–10.3)
Chloride: 109 mmol/L (ref 98–111)
Creatinine, Ser: 2.42 mg/dL — ABNORMAL HIGH (ref 0.44–1.00)
GFR calc Af Amer: 24 mL/min — ABNORMAL LOW (ref >60)
GFR calc non Af Amer: 21 mL/min — ABNORMAL LOW (ref >60)
Glucose, Bld: 168 mg/dL — ABNORMAL HIGH (ref 70–99)
Phosphorus: 4.9 mg/dL — ABNORMAL HIGH (ref 2.5–4.6)
Potassium: 4.8 mmol/L (ref 3.5–5.1)
Sodium: 138 mmol/L (ref 135–145)

## 2019-10-02 LAB — GLUCOSE, CAPILLARY: Glucose-Capillary: 178 mg/dL — ABNORMAL HIGH (ref 70–99)

## 2019-10-02 MED ORDER — TRAMADOL HCL 50 MG PO TABS: 50.0000 mg | ORAL_TABLET | Freq: Four times a day (QID) | ORAL | 0 refills | Status: DC | PRN

## 2019-10-02 MED ORDER — CARVEDILOL 6.25 MG PO TABS: 6.2500 mg | ORAL_TABLET | Freq: Two times a day (BID) | ORAL | 2 refills | Status: DC

## 2019-10-02 NOTE — TOC Transition Note (Addendum)
Transition of Care [facility]) - CM/SW Discharge Note   Patient Details  Name: Maureen Jackson MRN: [id number] Date of Birth: July 20, 1956  [facility] CM/SW Contact:  Zenon Mayo, RN Phone Number: 10/02/2019, 9:12 AM   Clinical Narrative:    Patient to be discharged to [facility] today, daughter , [name] notified.  NCM spoke with Tammy at [facility], she will go to room 118 , Staff RN to call report to [phone]. PTAR scheduled to pick up at 10 am.   Final next level of care: [place] Barriers to Discharge: No Barriers Identified   Patient Goals and CMS Choice Patient states their goals for this hospitalization and ongoing recovery are:: get better CMS Medicare.gov Compare Post Acute Care list provided to:: Patient Represenative (must comment)(daughter) Choice offered to / list presented to : Adult Children  Discharge Placement              Patient chooses bed at: ([facility]) Patient to be transferred to facility by: [facility] Name of family member notified: [name] (daughter) [phone] Patient and family notified of of transfer: 10/02/19  Discharge Plan and Services In-house Referral: NA Discharge Planning Services: CM Consult Post Acute Care Choice: [place]          DME Arranged: (NA)         HH Arranged: NA          Social Determinants of Health (SDOH) Interventions     Readmission Risk Interventions Readmission Risk Prevention Plan 09/18/2019  Transportation Screening Complete  HRI or [place] Complete  Social Work Consult for [place] Planning/Counseling Complete  Palliative Care Screening Not Applicable  Medication Review Press photographer) Complete  Some recent data might be hidden

## 2019-10-03 DIAGNOSIS — J189 Pneumonia, unspecified organism: Secondary | ICD-10-CM | POA: Diagnosis not present

## 2019-10-03 DIAGNOSIS — E039 Hypothyroidism, unspecified: Secondary | ICD-10-CM | POA: Diagnosis not present

## 2019-10-03 DIAGNOSIS — N179 Acute kidney failure, unspecified: Secondary | ICD-10-CM | POA: Diagnosis not present

## 2019-10-04 ENCOUNTER — Encounter (HOSPITAL_COMMUNITY): Payer: Self-pay | Admitting: Emergency Medicine

## 2019-10-04 ENCOUNTER — Emergency Department (HOSPITAL_COMMUNITY)
Admission: EM | Admit: 2019-10-04 | Discharge: 2019-10-07 | Disposition: A | Discharge status: Home / Self Care | Payer: Medicare HMO | Source: Home / Self Care | Attending: Emergency Medicine | Admitting: Emergency Medicine

## 2019-10-04 ENCOUNTER — Emergency Department (HOSPITAL_COMMUNITY)
Admission: EM | Admit: 2019-10-04 | Discharge: 2019-10-04 | Disposition: A | Discharge status: Home / Self Care | Payer: Medicare HMO | Attending: Emergency Medicine | Admitting: Emergency Medicine

## 2019-10-04 ENCOUNTER — Emergency Department (HOSPITAL_COMMUNITY): Payer: Medicare HMO

## 2019-10-04 ENCOUNTER — Other Ambulatory Visit: Payer: Self-pay

## 2019-10-04 DIAGNOSIS — I5042 Chronic combined systolic (congestive) and diastolic (congestive) heart failure: Secondary | ICD-10-CM | POA: Diagnosis not present

## 2019-10-04 DIAGNOSIS — Y92193 Bedroom in other specified residential institution as the place of occurrence of the external cause: Secondary | ICD-10-CM | POA: Diagnosis not present

## 2019-10-04 DIAGNOSIS — R2689 Other abnormalities of gait and mobility: Secondary | ICD-10-CM | POA: Insufficient documentation

## 2019-10-04 DIAGNOSIS — Z043 Encounter for examination and observation following other accident: Secondary | ICD-10-CM | POA: Insufficient documentation

## 2019-10-04 DIAGNOSIS — Y9389 Activity, other specified: Secondary | ICD-10-CM | POA: Insufficient documentation

## 2019-10-04 DIAGNOSIS — Z7902 Long term (current) use of antithrombotics/antiplatelets: Secondary | ICD-10-CM | POA: Insufficient documentation

## 2019-10-04 DIAGNOSIS — Z7982 Long term (current) use of aspirin: Secondary | ICD-10-CM | POA: Diagnosis not present

## 2019-10-04 DIAGNOSIS — E119 Type 2 diabetes mellitus without complications: Secondary | ICD-10-CM | POA: Insufficient documentation

## 2019-10-04 DIAGNOSIS — Z79899 Other long term (current) drug therapy: Secondary | ICD-10-CM | POA: Diagnosis not present

## 2019-10-04 DIAGNOSIS — E039 Hypothyroidism, unspecified: Secondary | ICD-10-CM | POA: Insufficient documentation

## 2019-10-04 DIAGNOSIS — W19XXXD Unspecified fall, subsequent encounter: Secondary | ICD-10-CM

## 2019-10-04 DIAGNOSIS — R69 Illness, unspecified: Secondary | ICD-10-CM | POA: Diagnosis not present

## 2019-10-04 DIAGNOSIS — T7601XA Adult neglect or abandonment, suspected, initial encounter: Secondary | ICD-10-CM | POA: Insufficient documentation

## 2019-10-04 DIAGNOSIS — Z87891 Personal history of nicotine dependence: Secondary | ICD-10-CM | POA: Diagnosis not present

## 2019-10-04 DIAGNOSIS — S0990XA Unspecified injury of head, initial encounter: Secondary | ICD-10-CM | POA: Diagnosis not present

## 2019-10-04 DIAGNOSIS — Y990 Civilian activity done for income or pay: Secondary | ICD-10-CM | POA: Insufficient documentation

## 2019-10-04 DIAGNOSIS — Z0000 Encounter for general adult medical examination without abnormal findings: Secondary | ICD-10-CM | POA: Diagnosis not present

## 2019-10-04 DIAGNOSIS — I110 Hypertensive heart disease with heart failure: Secondary | ICD-10-CM | POA: Insufficient documentation

## 2019-10-04 DIAGNOSIS — W06XXXA Fall from bed, initial encounter: Secondary | ICD-10-CM | POA: Diagnosis not present

## 2019-10-04 DIAGNOSIS — W19XXXA Unspecified fall, initial encounter: Secondary | ICD-10-CM

## 2019-10-04 LAB — CBC WITH DIFFERENTIAL/PLATELET
Abs Immature Granulocytes: 0.03 10*3/uL (ref 0.00–0.07)
Basophils Absolute: 0 10*3/uL (ref 0.0–0.1)
Basophils Relative: 0 %
Eosinophils Absolute: 0.4 10*3/uL (ref 0.0–0.5)
Eosinophils Relative: 9 %
HCT: 28.5 % — ABNORMAL LOW (ref 36.0–46.0)
Hemoglobin: 8.7 g/dL — ABNORMAL LOW (ref 12.0–15.0)
Immature Granulocytes: 1 %
Lymphocytes Relative: 45 %
Lymphs Abs: 2.2 10*3/uL (ref 0.7–4.0)
MCH: 28.6 pg (ref 26.0–34.0)
MCHC: 30.5 g/dL (ref 30.0–36.0)
MCV: 93.8 fL (ref 80.0–100.0)
Monocytes Absolute: 0.6 10*3/uL (ref 0.1–1.0)
Monocytes Relative: 12 %
Neutro Abs: 1.6 10*3/uL — ABNORMAL LOW (ref 1.7–7.7)
Neutrophils Relative %: 33 %
Platelets: 158 10*3/uL (ref 150–400)
RBC: 3.04 MIL/uL — ABNORMAL LOW (ref 3.87–5.11)
RDW: 18.8 % — ABNORMAL HIGH (ref 11.5–15.5)
WBC: 4.8 10*3/uL (ref 4.0–10.5)
nRBC: 0 % (ref 0.0–0.2)

## 2019-10-04 LAB — BASIC METABOLIC PANEL
Anion gap: 9 (ref 5–15)
BUN: 48 mg/dL — ABNORMAL HIGH (ref 8–23)
CO2: 20 mmol/L — ABNORMAL LOW (ref 22–32)
Calcium: 8.5 mg/dL — ABNORMAL LOW (ref 8.9–10.3)
Chloride: 112 mmol/L — ABNORMAL HIGH (ref 98–111)
Creatinine, Ser: 2.19 mg/dL — ABNORMAL HIGH (ref 0.44–1.00)
GFR calc Af Amer: 27 mL/min — ABNORMAL LOW (ref >60)
GFR calc non Af Amer: 24 mL/min — ABNORMAL LOW (ref >60)
Glucose, Bld: 171 mg/dL — ABNORMAL HIGH (ref 70–99)
Potassium: 5 mmol/L (ref 3.5–5.1)
Sodium: 141 mmol/L (ref 135–145)

## 2019-10-04 MED ORDER — TIMOLOL MALEATE 0.5 % OP SOLN
1.0000 [drp] | Freq: Two times a day (BID) | OPHTHALMIC | Status: DC
  Administered 2019-10-04 – 2019-10-07 (×4): 1 [drp] via OPHTHALMIC
  Filled 2019-10-04: qty 5

## 2019-10-04 MED ORDER — NYSTATIN 100000 UNIT/GM EX POWD
Freq: Every day | CUTANEOUS | Status: DC
  Administered 2019-10-05 – 2019-10-07 (×3): via TOPICAL
  Filled 2019-10-04 (×2): qty 15

## 2019-10-04 MED ORDER — ONDANSETRON 4 MG PO TBDP: 4.0000 mg | ORAL_TABLET | Freq: Three times a day (TID) | ORAL | Status: DC | PRN

## 2019-10-04 MED ORDER — ASPIRIN EC 81 MG PO TBEC
81.0000 mg | DELAYED_RELEASE_TABLET | Freq: Every day | ORAL | Status: DC
  Administered 2019-10-05 – 2019-10-07 (×3): 81 mg via ORAL
  Filled 2019-10-04 (×3): qty 1

## 2019-10-04 MED ORDER — CARVEDILOL 12.5 MG PO TABS
6.2500 mg | ORAL_TABLET | Freq: Two times a day (BID) | ORAL | Status: DC
  Administered 2019-10-05 – 2019-10-07 (×5): 6.25 mg via ORAL
  Filled 2019-10-04 (×5): qty 1

## 2019-10-04 MED ORDER — INSULIN ASPART 100 UNIT/ML ~~LOC~~ SOLN
15.0000 [IU] | Freq: Three times a day (TID) | SUBCUTANEOUS | Status: DC
  Administered 2019-10-05: 15 [IU] via SUBCUTANEOUS

## 2019-10-04 MED ORDER — ALBUTEROL SULFATE HFA 108 (90 BASE) MCG/ACT IN AERS: 1.0000 | INHALATION_SPRAY | Freq: Four times a day (QID) | RESPIRATORY_TRACT | Status: DC | PRN

## 2019-10-04 MED ORDER — FUROSEMIDE 20 MG PO TABS
40.0000 mg | ORAL_TABLET | Freq: Every day | ORAL | Status: DC
  Administered 2019-10-05 – 2019-10-07 (×3): 40 mg via ORAL
  Filled 2019-10-04 (×3): qty 2

## 2019-10-04 MED ORDER — LORATADINE 10 MG PO TABS
10.0000 mg | ORAL_TABLET | Freq: Every day | ORAL | Status: DC
  Administered 2019-10-05 – 2019-10-07 (×3): 10 mg via ORAL
  Filled 2019-10-04 (×3): qty 1

## 2019-10-04 MED ORDER — LEVOTHYROXINE SODIUM 25 MCG PO TABS
125.0000 ug | ORAL_TABLET | Freq: Every day | ORAL | Status: DC
  Administered 2019-10-05 – 2019-10-07 (×3): 125 ug via ORAL
  Filled 2019-10-04 (×3): qty 1

## 2019-10-04 MED ORDER — CLOPIDOGREL BISULFATE 75 MG PO TABS
75.0000 mg | ORAL_TABLET | Freq: Every day | ORAL | Status: DC
  Administered 2019-10-05 – 2019-10-07 (×3): 75 mg via ORAL
  Filled 2019-10-04 (×3): qty 1

## 2019-10-04 MED ORDER — ATORVASTATIN CALCIUM 80 MG PO TABS
80.0000 mg | ORAL_TABLET | Freq: Every evening | ORAL | Status: DC
  Administered 2019-10-04 – 2019-10-06 (×3): 80 mg via ORAL
  Filled 2019-10-04 (×3): qty 1

## 2019-10-04 MED ORDER — TRAMADOL HCL 50 MG PO TABS
50.0000 mg | ORAL_TABLET | Freq: Four times a day (QID) | ORAL | Status: DC | PRN
  Administered 2019-10-05 – 2019-10-07 (×4): 50 mg via ORAL
  Filled 2019-10-04 (×4): qty 1

## 2019-10-04 NOTE — ED Notes (Signed)
Complete dressing change provided, sterile damp packing placed as per previous dsg. C/D/I.

## 2019-10-04 NOTE — ED Provider Notes (Signed)
I cared for the patient during her prior ED visit. She was medically cleared and discharged after a discussion with social work. [facility] attempted to refuse taking the patient back to the facility and after being taken by PTAR to said facility they would not open the doors to let her back in. She presents to the ED essentially for placement.   Will reconsult social work and case management.   At shift change, pt transitioned to default provider pending social work/case mgmt in the AM.    Tivis Ringer, Daffney Greenly S, PA-C 10/05/19 0015    Blanchie Dessert, MD 10/05/19 2314

## 2019-10-04 NOTE — ED Notes (Addendum)
Attempted to call report to [facility]
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
## 2019-10-04 NOTE — ED Notes (Signed)
Attempted to call [facility] to give report.

## 2019-10-04 NOTE — ED Notes (Signed)
Patient verbalizes understanding of discharge instructions. Opportunity for questioning and answers were provided. Armband removed by staff, pt discharged from ED. Pt. ambulatory and discharged to facility via [facility]
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
## 2019-10-04 NOTE — Discharge Instructions (Signed)
Your laboratory work today is at baseline.  The CT scan of your head did not show any acute findings after your fall today.  Please follow up with your primary care provider within 5-7 days for re-evaluation of your symptoms.  Please return to the emergency department for any new or worsening symptoms.

## 2019-10-04 NOTE — ED Triage Notes (Addendum)
Pt to triage via [facility]>  Pt from [place].  Pt slid and fell at 5am and denies any injuries.  Family request patient come to ED for eval and they are looking into getting pt to a different assisted living.  Pt incontinent of urine in wheelchair when brought to triage room.  Pt changed into new gown and socks.  Wheelchair cleaned and chux placed.

## 2019-10-04 NOTE — ED Triage Notes (Signed)
Pt was d/c from [facility] via [facility] and transported back from [place] at [facility] and refused to take the patient back.

## 2019-10-04 NOTE — ED Notes (Signed)
Meal given

## 2019-10-05 LAB — CBG MONITORING, ED
Glucose-Capillary: 116 mg/dL — ABNORMAL HIGH (ref 70–99)
Glucose-Capillary: 160 mg/dL — ABNORMAL HIGH (ref 70–99)
Glucose-Capillary: 91 mg/dL (ref 70–99)

## 2019-10-05 NOTE — ED Notes (Signed)
PT ambulatory to plug phone in

## 2019-10-05 NOTE — TOC Initial Note (Signed)
Transition of Care [facility]) - Initial/Assessment Note    Patient Details  Name: Maureen Jackson MRN: [id number] Date of Birth: 1956/06/29  [facility]) CM/SW Contact:    Oretha Milch, LCSW Phone Number: 10/05/2019, 8:34 AM  Clinical Narrative: CSW received consult for skilled nursing facility. CSW met with patient and assessed for skilled nursing facility  concerns. CSW notes patient reports that patient is unable to return to [facility]. Patient was open to discussion of skilled nursing facility. CSW discussed community resources and options for support. CSW noted patient attempted to discharge from the ED back to [facility] and facility refused entry.   CSW spoke with daughter regarding patient's skilled nursing facility concerns/questions. CSW gathered relevant collateral on patient's social environment and current lifestyle. CSW notes per daughter that patient had allegations of staff not support her appropriately. CSW noted these factors regarding patient's current living situation and health. CSW will provide support to patient with these concerns by following up with weekday CSW for [facility] report.    SNF: CSW informed patient and daughter of SNFs in the [place] are that patient may qualify for with their medical needs and insurance coverage. CSW informed patient of the following possible barriers to placement: unable to return to facility . CSW informed patient that as patient is in the emergency department their choice of SNF is still their choice, however due to the nature of emergency department services a bed offer decision needs to be made quickly due to limitations on ED service. . CSW inquired if patient or daughter had further questions at this time and noted no current questions or concerns.                         Patient Goals and CMS Choice        Expected Discharge Plan and Services                                                Prior  Living Arrangements/Services                       Activities of Daily Living      Permission Sought/Granted                  Emotional Assessment              Admission diagnosis:  Social Work Patient Active Problem List   Diagnosis Date Noted  . Pressure injury of skin 09/21/2019  . Acute renal failure (ARF) ([place])   . AMS (altered mental status)   . Drug rash   . Abscess   . Fistula   . Labial abscess   . Fournier's gangrene in female   . CAP (community acquired pneumonia) 09/08/2019  . Chest pain 07/16/2018  . Hyperlipidemia 04/10/2018  . TIA (transient ischemic attack) 03/14/2018  . Vertigo 03/14/2018  . Chronic combined systolic and diastolic CHF (congestive heart failure) (EF 30 to 35 %) 03/14/2018  . Ischemic cardiomyopathy 11/06/2017  . CAD, multiple vessel   . Pericardial effusion 09/07/2017  . AKI (acute kidney injury) ([place]) 09/07/2017  . Elevated troponin I level 09/07/2017  . Acute combined systolic (congestive) and diastolic (congestive) heart failure ([place]) 09/06/2017  . Cardiomegaly 09/05/2017  . Morbid obesity/BMI > 55 03/04/2013  .  Labyrinthitis 03/04/2013  . Diabetes mellitus type 2 with complications, uncontrolled ([place]) 03/04/2013  . GASTRITIS 12/13/2006  . HIATAL HERNIA, HX OF 12/13/2006  . THYROIDECTOMY, HX OF 12/13/2006  . Hypothyroidism 10/04/2006  . TOBACCO ABUSE 10/04/2006  . CARPAL TUNNEL SYNDROME 10/04/2006  . Essential hypertension 10/04/2006  . GERD 10/04/2006  . POSTMENOPAUSAL STATUS 10/04/2006  . SKIN TAG 10/04/2006  . KNEE PAIN, LEFT 10/04/2006  . Sleep apnea 10/04/2006  . LEG EDEMA 10/04/2006   PCP:  Neale Burly, MD Pharmacy:  No Pharmacies Listed    Social Determinants of Health (SDOH) Interventions    Readmission Risk Interventions Readmission Risk Prevention Plan 09/18/2019  Transportation Screening Complete  HRI or Home Care Consult Complete  Social Work Consult for [place]  Planning/Counseling Complete  Palliative Care Screening Not Applicable  Medication Review [employer]) Complete  Some recent data might be hidden

## 2019-10-05 NOTE — Progress Notes (Signed)
CSW referred patient for SNF placement to other facilities as [facility] will not allow patient to return.   Daphine Deutscher, LCSW, [place] Social Worker II Emergency Department, [place], [place] [phone]

## 2019-10-05 NOTE — ED Notes (Signed)
Pt eating dinner. States she is feeling better after she has slept. Pt aware her sister, Hilda Blades, called earlier.

## 2019-10-05 NOTE — ED Notes (Signed)
Pt spoke w/her daughter on her cell phone and is now talking w/her sister. Pt aware no visitors at this time.

## 2019-10-05 NOTE — ED Notes (Signed)
New PureWick applied d/t present one noted to be lying on floor. Pt states she does not know how it fell on floor. Warm blankets applied as requested.

## 2019-10-05 NOTE — ED Notes (Signed)
Pt declining to eat lunch. Offered pt sandwich, crackers/graham crackers. Pt declined. Pt asking if she may special order lunch - advised pt unable to do so. Pt voiced understanding.

## 2019-10-05 NOTE — ED Notes (Signed)
Pt on phone w/sister - they are asking if pt can go "downstairs". Advised not able to do so. Voiced understanding.

## 2019-10-05 NOTE — ED Notes (Signed)
Pt's sister, Hilda Blades, called - advised her pt is resting at this time and that she may call her cell phone later.

## 2019-10-05 NOTE — ED Notes (Signed)
Pt appears and is c/o feeling uncomfortable lying on stretcher. Pt ambulatory to bed after cleaned from urinary incont urine and changing dressing to open wound to mid lower abd. Pt noted to be alert, oriented. New Pure Wick applied.

## 2019-10-05 NOTE — ED Notes (Signed)
Pt called RN to room - stating she wanted to ambulate to bathroom. When pt attempted to stand, pt noted to be incont - PureWick intact and was able to catch urine. Pt laid back on bed herself.

## 2019-10-06 LAB — CBG MONITORING, ED: Glucose-Capillary: 125 mg/dL — ABNORMAL HIGH (ref 70–99)

## 2019-10-06 NOTE — ED Notes (Signed)
Pt declined to eat breakfast, offered graham crackers and applesauce which she also declined.

## 2019-10-06 NOTE — Evaluation (Signed)
Physical Therapy Evaluation Patient Details Name: Maureen Jackson MRN: [id number] DOB: Mar 12, 1956 Today's Date: 10/06/2019   History of Present Illness  Patient is a 63 y/o female admitted from SNF secondary to a fall.  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  She underwent I&D 9/26 & 10/6.  PMH includes morbid obesity, HF, HTN, CAD, OSA.  Clinical Impression  Patient presents with decreased independence and safety with mobility due to decreased strength, decreased balance, decreased endurance and continues to be at risk for falls.  Though reports fall at nursing facility happened without an assistive device.  She needs overall min to minguard A.  Reports her daughter works 2:30 - 11pm.  Feel safest D/C plan would be STSNF as she does not have 24 hour assist at home.  However, if unable to go to SNF would recommend HHPT/aide/RN bariatric rollator for home and bariatric 3:1.  PT to follow until d/c.     Follow Up Recommendations SNF;Supervision/Assistance - 24 hour;Home health PT    Equipment Recommendations  3in1 (PT);Other (comment)(wide rollator)    Recommendations for Other Services       Precautions / Restrictions Precautions Precautions: Fall Precaution Comments: abscess L thigh & labia      Mobility  Bed Mobility Overal bed mobility: Needs Assistance         Sit to supine: Mod assist   General bed mobility comments: assist for lifting legs and positioning once supine  Transfers Overall transfer level: Needs assistance Equipment used: 4-wheeled walker Transfers: Sit to/from Stand Sit to Stand: Min guard         General transfer comment: pushing up from bed/recliner with increased time and effort  Ambulation/Gait Ambulation/Gait assistance: Min guard Gait Distance (Feet): 90 Feet(&30') Assistive device: 4-wheeled walker Gait Pattern/deviations: Step-through pattern;Trunk  flexed;Decreased stride length     General Gait Details: with rollator in ED some SOB and needing to sit in recliner outside her room to rest  Stairs            Wheelchair Mobility    Modified Rankin (Stroke Patients Only)       Balance Overall balance assessment: Needs assistance   Sitting balance-Leahy Scale: Good       Standing balance-Leahy Scale: Fair Standing balance comment: can stand briefly without UE support as we helped her don briefs with ABD pad prior to ambulation due to dressing and packing coming out of wound as RN reports waiting for packing to arrive to redress wound                             Pertinent Vitals/Pain Pain Assessment: Faces Faces Pain Scale: Hurts even more Pain Location: R labia/thigh Pain Descriptors / Indicators: Burning Pain Intervention(s): Monitored during session;Repositioned;Patient requesting pain meds-RN notified    Home Living Family/patient expects to be discharged to:: Private residence Living Arrangements: Children Available Help at Discharge: Friend(s);Family Type of Home: Apartment Home Access: Level entry     Home Layout: One level Home Equipment: None Additional Comments: pt states no DME, last encounter states  has RW. will check again prior to DC if necessary    Prior Function Level of Independence: Needs assistance   Gait / Transfers Assistance Needed: last admission needed overall min to minguard A for transfers and ambulation short distances with rollator; was independent with mobilty prior to last admission  Hand Dominance        Extremity/Trunk Assessment   Upper Extremity Assessment Upper Extremity Assessment: Generalized weakness    Lower Extremity Assessment Lower Extremity Assessment: Generalized weakness;RLE deficits/detail;LLE deficits/detail RLE Deficits / Details: noted edema in LE's able to move LE's generally WFL, but grimaces with knee flexion enough to  stand LLE Deficits / Details: noted edema in LE's able to move LE's generally WFL, but grimaces with knee flexion enough to stand       Communication   Communication: No difficulties  Cognition Arousal/Alertness: Awake/alert Behavior During Therapy: WFL for tasks assessed/performed Overall Cognitive Status: Within Functional Limits for tasks assessed                                        General Comments General comments (skin integrity, edema, etc.): HR 97, SpO2 98% ambulating on RA    Exercises     Assessment/Plan    PT Assessment Patient needs continued PT services  PT Problem List Decreased strength;Decreased activity tolerance;Decreased mobility;Decreased balance;Decreased knowledge of use of DME;Decreased knowledge of precautions;Decreased safety awareness       PT Treatment Interventions DME instruction;Gait training;Functional mobility training;Therapeutic exercise;Patient/family education;Balance training;Therapeutic activities    PT Goals (Current goals can be found in the Care Plan section)  Acute Rehab PT Goals Patient Stated Goal: to go home PT Goal Formulation: With patient Time For Goal Achievement: 10/20/19 Potential to Achieve Goals: Good    Frequency Min 3X/week   Barriers to discharge        Co-evaluation               AM-PAC PT "6 Clicks" Mobility  Outcome Measure Help needed turning from your back to your side while in a flat bed without using bedrails?: A Little Help needed moving from lying on your back to sitting on the side of a flat bed without using bedrails?: A Little Help needed moving to and from a bed to a chair (including a wheelchair)?: A Little Help needed standing up from a chair using your arms (e.g., wheelchair or bedside chair)?: A Little Help needed to walk in hospital room?: A Little Help needed climbing 3-5 steps with a railing? : A Lot 6 Click Score: 17    End of Session   Activity Tolerance:  Patient limited by fatigue Patient left: in bed;with call bell/phone within reach Nurse Communication: Patient requests pain meds PT Visit Diagnosis: Other abnormalities of gait and mobility (R26.89);Muscle weakness (generalized) (M62.81)    Time: 5573-2202 PT Time Calculation (min) (ACUTE ONLY): 26 min   Charges:   PT Evaluation $PT Eval Moderate Complexity: 1 Mod PT Treatments $Gait Training: 8-22 mins        [name], PT Acute Rehabilitation Services [phone] 10/06/2019   Maureen Jackson 10/06/2019, 6:04 PM

## 2019-10-06 NOTE — ED Notes (Addendum)
Pt c/o pain on laying down, assisted pt to sitting position and messaged md per pt's request for additional meds, no new orders at this time.

## 2019-10-06 NOTE — Progress Notes (Addendum)
10:00am: CSW received return call from patient's sister Hilda Blades and CSW informed her that due the incident that occurred this weekend when the patient was returning to [place], it would be nearly impossible for a SNF placement to be found. CSW inquired with Hilda Blades about sending the patient home with home health and she was agreeable. Hilda Blades states she works daily until 2:30pm but could go sit with the patient after that. There are several other family members involved as well.  CSW spoke with RN CM who agreed the most appropriate disposition for this patient will be home with home health due to the barriers to finding a new SNF placement.  CSW attempted to reach [facility] admissions staff per Debra's request, no answer so a voicemail was left requesting a return call.  8:45am: CSW attempted to reach patient's sister Hilda Blades at [phone] without success, a voicemail was left requesting a return call.  Madilyn Fireman, MSW, LCSW-A Transitions of Care  Clinical Social Worker  [facility] Emergency Departments  Medical ICU [phone]

## 2019-10-06 NOTE — ED Notes (Signed)
Dinner tray deliver to pt

## 2019-10-06 NOTE — ED Notes (Signed)
Materials ordered for wound care is not in stock per materials. Waiting on wound care to return page to advise as to what should be used in its place

## 2019-10-06 NOTE — Discharge Planning (Signed)
EDCM attempting to secure [facility] services as SNF placement seems unlikely. EDCM has reached out to several [facility] agencies covered by pt insurance and will update the chart when information becomes available.

## 2019-10-06 NOTE — ED Notes (Signed)
PT at bedside.

## 2019-10-06 NOTE — Discharge Planning (Signed)
Felisia Balcom J. Clydene Laming, RN, BSN, [employer] [phone] Spoke with pt at bedside regarding discharge planning for [facility]. Offered pt list of home health agencies to choose from.  Pt chose [facility] to render services. Malachy Mood of [facility] notified. Patient made aware that [facility] will be in contact in 24-48 hours.  No DME needs identified at this time.

## 2019-10-06 NOTE — Discharge Planning (Signed)
Clinical Social Work is seeking post-discharge placement for this patient at the following level of care: SNF.    

## 2019-10-06 NOTE — ED Notes (Signed)
This nurse paged wound nurse for further recommendations. Awaiting call back.

## 2019-10-06 NOTE — ED Notes (Signed)
Bed cleaned and linens changed

## 2019-10-06 NOTE — Consult Note (Signed)
[facility] Nurse wound consult note Reason for Consult: S/P left Labial abscess, I & D.  Mons Pubis involvement.  Performed 09/13/19.  Needs topical wound care recommendations while awaiting SNF placement.  Wound type: Infectious  S/P I & D/debridement.  Pressure Injury POA: NA Measurement: 4 cm x 5 cm x 4 cm to mons pubis Wound GOV:PCHE pink nongranulating   Drainage (amount, consistency, odor) moderate purulence  Musty odor Periwound:erythema and edema Dressing procedure/placement/frequency: Cleanse debridement site to mos pubis and labia with NS and pat dry. Fill dead space with  Aquacel Ag  ([facility] # [phone]) Cover with dry gauze and ABD pad.  Hold in place with mesh panties.   Change daily.  Will not follow at this time.  Please re-consult if needed.  Domenic Moras MSN, RN, FNP-BC CWON Wound, Ostomy, Continence Nurse Pager [phone]

## 2019-10-07 ENCOUNTER — Ambulatory Visit: Payer: Medicare HMO | Admitting: Cardiology

## 2019-10-07 DIAGNOSIS — Z9181 History of falling: Secondary | ICD-10-CM | POA: Diagnosis not present

## 2019-10-07 NOTE — Progress Notes (Signed)
CSW spoke with patient's sister Hilda Blades to inform her of discharge plan home with home health services from [place] and DME from [place]. Hilda Blades was agreeable to plan and stated she would come pick up the patient at 2pm today.  CSW notified patient's RN Jacqlyn Larsen of plan. RN CM will coordinate patient's DME prior to discharge.  Madilyn Fireman, MSW, LCSW-A Transitions of Care  Clinical Social Worker  [facility] Emergency Departments  Medical ICU [phone]

## 2019-10-07 NOTE — Progress Notes (Signed)
Occupational Therapy Progress Note  Pt instructed in use of reacher for picking up items from floor and to assist with ADLs.  She attempted to doff socks, but struggled and was unable to do so.  Pt put forth poor effort even with encouragement, stating she can't.  She reports that family will assist her as needed.  Pt also instructed in use of LH sponge.     10/07/19 1500  OT Visit Information  Last OT Received On 10/07/19  Assistance Needed +1  History of Present Illness Patient is a 63 y/o female admitted from SNF secondary to a fall.  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  She underwent I&D 9/26 & 10/6.  PMH includes morbid obesity, HF, HTN, CAD, OSA.  Precautions  Precautions Fall  Precaution Comments abscess L thigh & labia  Pain Assessment  Pain Assessment Faces  Faces Pain Scale 4  Pain Location Lt abdomen   Pain Descriptors / Indicators Aching  Pain Intervention(s) Monitored during session  Cognition  Arousal/Alertness Awake/alert  Behavior During Therapy WFL for tasks assessed/performed  Overall Cognitive Status Within Functional Limits for tasks assessed  General Comments not formally assessed, but [facility] for basic info   Upper Extremity Assessment  Upper Extremity Assessment Generalized weakness  Lower Extremity Assessment  Lower Extremity Assessment Defer to PT evaluation  ADL  General ADL Comments Pt instructed in use of AE. She attempted to doff socks with reacher, but unable to do so, and was highly distracted by the TV.  Attempted to encourage pt to continue attempts, but she interacted very little, and made one more attempt before stopping.  She was also instructed in use of LH sponge for bathing feet and was able to verbalize/nod understanding   Bed Mobility  General bed mobility comments Pt sitting EOB   General Comments  General comments (skin integrity, edema, etc.) reinforced  recommendation to keep BSC next to bed.  pt also reports that her chairs in living room are low and difficulty to get out of.  Instructed her to stack 2 cushions to raise seat height if cushions are stable enough, or to use another chair that has better support   OT - End of Session  Activity Tolerance Patient tolerated treatment well  Patient left in bed;with call bell/phone within reach  Nurse Communication Mobility status  OT Assessment/Plan  OT Plan Discharge plan needs to be updated;Frequency remains appropriate  OT Visit Diagnosis Unsteadiness on feet (R26.81);Muscle weakness (generalized) (M62.81);Pain  Pain - part of body  (abdomen )  OT Frequency (ACUTE ONLY) Min 2X/week  Follow Up Recommendations Home health OT;Supervision/Assistance - 24 hour  OT Equipment 3 in 1 bedside commode  AM-PAC OT "6 Clicks" Daily Activity Outcome Measure (Version 2)  Help from another person eating meals? 4  Help from another person taking care of personal grooming? 4  Help from another person toileting, which includes using toliet, bedpan, or urinal? 3  Help from another person bathing (including washing, rinsing, drying)? 2  Help from another person to put on and taking off regular upper body clothing? 3  Help from another person to put on and taking off regular lower body clothing? 2  6 Click Score 18  OT Goal Progression  Progress towards OT goals Progressing toward goals  OT Time Calculation  OT Start Time (ACUTE ONLY) 1345  OT Stop Time (ACUTE ONLY) 1353  OT Time Calculation (min) 8  min  OT General Charges  $OT Visit 1 Visit  OT Treatments  $Self Care/Home Management  8-22 mins  Lucille Passy, OTR/L Acute Rehabilitation Services Pager [phone] Office [phone]

## 2019-10-07 NOTE — Evaluation (Signed)
Occupational Therapy Evaluation Patient Details Name: Maureen Jackson MRN: [id number] DOB: 10/25/1956 Today's Date: 10/07/2019    History of Present Illness Patient is a 63 y/o female admitted from SNF secondary to a fall.  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  She underwent I&D 9/26 & 10/6.  PMH includes morbid obesity, HF, HTN, CAD, OSA.   Clinical Impression   Pt admitted with the above diagnosis and demonstrates the below listed deficits.  She currently is unable to access her feet for LB ADLs and will benefit from AE instruction.  She requires close min guard assist for functional mobility, and fatigues quickly placing her at risk for falls.  She indicates she will have 24 hour assist at discharge initially, then later states there may be periods of time when she is alone - 24 hour supervision/assist is optimal.  Pt has been at SNF since previous admission, and required assist for ADLs and functional mobility.  She will benefit from continued OT for AE education to improve independence with ADLs.     Follow Up Recommendations  Home health OT;Supervision/Assistance - 24 hour    Equipment Recommendations  3 in 1 bedside commode    Recommendations for Other Services       Precautions / Restrictions Precautions Precautions: Fall Precaution Comments: abscess L thigh & labia      Mobility Bed Mobility               General bed mobility comments: Pt sitting EOB   Transfers Overall transfer level: Needs assistance Equipment used: 1 person hand held assist Transfers: Sit to/from [facility] Sit to Stand: Min guard Stand pivot transfers: Min guard       General transfer comment: Pt prefers to furnitur walk when in her room     Balance Overall balance assessment: Needs assistance Sitting-balance support: No upper extremity supported;Feet supported Sitting balance-Leahy  Scale: Good Sitting balance - Comments: Pt sitting EOB    Standing balance support: No upper extremity supported;During functional activity Standing balance-Leahy Scale: Fair Standing balance comment: able to maintain static standing with min guard assist with no UE support                            ADL either performed or assessed with clinical judgement   ADL Overall ADL's : Needs assistance/impaired Eating/Feeding: Independent   Grooming: Wash/dry hands;Wash/dry face;Oral care;Set up;Sitting   Upper Body Bathing: Minimal assistance;Sitting   Lower Body Bathing: Moderate assistance;Sit to/from stand   Upper Body Dressing : Set up;Sitting   Lower Body Dressing: Maximal assistance;Sit to/from stand Lower Body Dressing Details (indicate cue type and reason): Pt unable to access feet  Toilet Transfer: Min guard;Stand-pivot;BSC   Toileting- Clothing Manipulation and Hygiene: Sit to/from stand;Minimal assistance       Functional mobility during ADLs: Min guard;Rolling walker       Vision         Perception     Praxis      Pertinent Vitals/Pain Pain Assessment: Faces Faces Pain Scale: Hurts little more Pain Location: Lt abdomen  Pain Descriptors / Indicators: Aching Pain Intervention(s): Monitored during session;Limited activity within patient's tolerance     Hand Dominance Right   Extremity/Trunk Assessment Upper Extremity Assessment Upper Extremity Assessment: Generalized weakness   Lower Extremity Assessment Lower Extremity Assessment: Defer to PT evaluation   Cervical / Trunk  Assessment Cervical / Trunk Exceptions: lower abdominal wounds    Communication Communication Communication: No difficulties   Cognition Arousal/Alertness: Awake/alert Behavior During Therapy: WFL for tasks assessed/performed Overall Cognitive Status: Within Functional Limits for tasks assessed                                 General Comments: not  formally assessed, but [facility] for basic info    General Comments  Discussed recommendation for 3in1 for home use.  Pt initially resistive, but discussed fall risk especially if she has urgency, and pt agreeable.  Also discussed AE for LB ADLs, and pt is interested     Exercises     Shoulder Instructions      Home Living Family/patient expects to be discharged to:: Private residence Living Arrangements: Children Available Help at Discharge: Friend(s);Family Type of Home: Apartment Home Access: Level entry     Home Layout: One level     Bathroom Shower/Tub: Teacher, early years/pre: Standard     Home Equipment: None   Additional Comments: Pt insists she will have support at home, but is unsure exactly who will be assisting her when daughter is at work. She also reports that apartment is small with bathroom next to bedroom       Prior Functioning/Environment Level of Independence: Needs assistance  Gait / Transfers Assistance Needed: last admission needed overall min to minguard A for transfers and ambulation short distances with rollator; was independent with mobilty prior to last admission ADL's / Homemaking Assistance Needed: Pt reports she has required assist for LB ADLs due to abdominal pain             OT Problem List: Decreased strength;Decreased activity tolerance;Impaired balance (sitting and/or standing);Decreased knowledge of use of DME or AE;Pain;Obesity      OT Treatment/Interventions: Self-care/ADL training;DME and/or AE instruction;Therapeutic activities;Patient/family education;Balance training    OT Goals(Current goals can be found in the care plan section) Acute Rehab OT Goals Patient Stated Goal: to go home OT Goal Formulation: With patient Time For Goal Achievement: 10/08/19 Potential to Achieve Goals: Good ADL Goals Pt Will Perform Lower Body Bathing: with min guard assist;sit to/from stand;with adaptive equipment Pt Will Perform Lower Body  Dressing: with min guard assist;sit to/from stand  OT Frequency: Min 2X/week   Barriers to D/C: Decreased caregiver support  Pt reports there may be some periods of time when she is home alone        Co-evaluation              AM-PAC OT "6 Clicks" Daily Activity     Outcome Measure Help from another person eating meals?: None Help from another person taking care of personal grooming?: None Help from another person toileting, which includes using toliet, bedpan, or urinal?: A Little Help from another person bathing (including washing, rinsing, drying)?: A Lot Help from another person to put on and taking off regular upper body clothing?: A Little Help from another person to put on and taking off regular lower body clothing?: A Lot 6 Click Score: 18   End of Session Nurse Communication: Mobility status  Activity Tolerance: Patient tolerated treatment well Patient left: in bed;with call bell/phone within reach  OT Visit Diagnosis: Unsteadiness on feet (R26.81);Muscle weakness (generalized) (M62.81);Pain Pain - part of body: (abdomen )                Time: 5366-4403  OT Time Calculation (min): 14 min Charges:  OT General Charges $OT Visit: 1 Visit OT Evaluation $OT Eval Moderate Complexity: 1 Mod  Lucille Passy, OTR/L Acute Rehabilitation Services Pager [phone] Office [phone]   Lucille Passy M 10/07/2019, 3:07 PM

## 2019-10-07 NOTE — ED Notes (Signed)
Pt sitting on side of bed w/o difficulty on her cell phone. Advised of tx plan by Rosendo Gros, CM.

## 2019-10-07 NOTE — ED Notes (Signed)
PT w/pt.  

## 2019-10-07 NOTE — ED Notes (Addendum)
Pt voiced understanding of d/c instructions. Pt unable to signature pad. escorted to pt's sister's vehicle via w/c. Rollator and items x 2 from PT given to pt's sister. Belongings x 1 bag w/pt's cell phone and charger also given to sister who placed in vehicle.

## 2019-10-07 NOTE — ED Notes (Signed)
Sister is waiting in vehicle for pt. Pt ambulating in room gathering her belongings while waiting for d/c paperwork.

## 2019-10-07 NOTE — ED Provider Notes (Signed)
p Physical Exam  BP (!) 133/59 (BP Location: Left Arm)   Pulse 74   Temp 98.2 F (36.8 C) (Oral)   Resp 18   SpO2 99%   Physical Exam  ED Course/Procedures     Procedures  MDM  Patient seen by social work, case management and now has a place to go. Stable for discharge      Drenda Freeze, MD 10/07/19 1410

## 2019-10-07 NOTE — Discharge Instructions (Signed)
Continue your current meds   See your doctor  Return to ER if you have another fall, weakness

## 2019-10-07 NOTE — ED Notes (Signed)
Pt fed self breakfast.

## 2019-10-09 DIAGNOSIS — Z8673 Personal history of transient ischemic attack (TIA), and cerebral infarction without residual deficits: Secondary | ICD-10-CM | POA: Diagnosis not present

## 2019-10-09 DIAGNOSIS — I5022 Chronic systolic (congestive) heart failure: Secondary | ICD-10-CM | POA: Diagnosis not present

## 2019-10-09 DIAGNOSIS — I110 Hypertensive heart disease with heart failure: Secondary | ICD-10-CM | POA: Diagnosis not present

## 2019-10-09 DIAGNOSIS — Z4801 Encounter for change or removal of surgical wound dressing: Secondary | ICD-10-CM | POA: Diagnosis not present

## 2019-10-09 DIAGNOSIS — H409 Unspecified glaucoma: Secondary | ICD-10-CM | POA: Diagnosis not present

## 2019-10-09 DIAGNOSIS — G4733 Obstructive sleep apnea (adult) (pediatric): Secondary | ICD-10-CM | POA: Diagnosis not present

## 2019-10-09 DIAGNOSIS — E1140 Type 2 diabetes mellitus with diabetic neuropathy, unspecified: Secondary | ICD-10-CM | POA: Diagnosis not present

## 2019-10-09 DIAGNOSIS — I2510 Atherosclerotic heart disease of native coronary artery without angina pectoris: Secondary | ICD-10-CM | POA: Diagnosis not present

## 2019-10-09 DIAGNOSIS — N764 Abscess of vulva: Secondary | ICD-10-CM | POA: Diagnosis not present

## 2019-10-13 DIAGNOSIS — I110 Hypertensive heart disease with heart failure: Secondary | ICD-10-CM | POA: Diagnosis not present

## 2019-10-13 DIAGNOSIS — Z4801 Encounter for change or removal of surgical wound dressing: Secondary | ICD-10-CM | POA: Diagnosis not present

## 2019-10-13 DIAGNOSIS — I5022 Chronic systolic (congestive) heart failure: Secondary | ICD-10-CM | POA: Diagnosis not present

## 2019-10-13 DIAGNOSIS — E1140 Type 2 diabetes mellitus with diabetic neuropathy, unspecified: Secondary | ICD-10-CM | POA: Diagnosis not present

## 2019-10-13 DIAGNOSIS — G4733 Obstructive sleep apnea (adult) (pediatric): Secondary | ICD-10-CM | POA: Diagnosis not present

## 2019-10-13 DIAGNOSIS — Z8673 Personal history of transient ischemic attack (TIA), and cerebral infarction without residual deficits: Secondary | ICD-10-CM | POA: Diagnosis not present

## 2019-10-13 DIAGNOSIS — H409 Unspecified glaucoma: Secondary | ICD-10-CM | POA: Diagnosis not present

## 2019-10-13 DIAGNOSIS — I2510 Atherosclerotic heart disease of native coronary artery without angina pectoris: Secondary | ICD-10-CM | POA: Diagnosis not present

## 2019-10-13 DIAGNOSIS — N764 Abscess of vulva: Secondary | ICD-10-CM | POA: Diagnosis not present

## 2019-10-16 DIAGNOSIS — I2510 Atherosclerotic heart disease of native coronary artery without angina pectoris: Secondary | ICD-10-CM | POA: Diagnosis not present

## 2019-10-16 DIAGNOSIS — H409 Unspecified glaucoma: Secondary | ICD-10-CM | POA: Diagnosis not present

## 2019-10-16 DIAGNOSIS — E1140 Type 2 diabetes mellitus with diabetic neuropathy, unspecified: Secondary | ICD-10-CM | POA: Diagnosis not present

## 2019-10-16 DIAGNOSIS — Z8673 Personal history of transient ischemic attack (TIA), and cerebral infarction without residual deficits: Secondary | ICD-10-CM | POA: Diagnosis not present

## 2019-10-16 DIAGNOSIS — I5022 Chronic systolic (congestive) heart failure: Secondary | ICD-10-CM | POA: Diagnosis not present

## 2019-10-16 DIAGNOSIS — N764 Abscess of vulva: Secondary | ICD-10-CM | POA: Diagnosis not present

## 2019-10-16 DIAGNOSIS — I110 Hypertensive heart disease with heart failure: Secondary | ICD-10-CM | POA: Diagnosis not present

## 2019-10-16 DIAGNOSIS — Z4801 Encounter for change or removal of surgical wound dressing: Secondary | ICD-10-CM | POA: Diagnosis not present

## 2019-10-16 DIAGNOSIS — G4733 Obstructive sleep apnea (adult) (pediatric): Secondary | ICD-10-CM | POA: Diagnosis not present

## 2019-10-17 ENCOUNTER — Other Ambulatory Visit: Payer: Self-pay | Admitting: Student

## 2019-10-20 DIAGNOSIS — Z4801 Encounter for change or removal of surgical wound dressing: Secondary | ICD-10-CM | POA: Diagnosis not present

## 2019-10-20 DIAGNOSIS — I2510 Atherosclerotic heart disease of native coronary artery without angina pectoris: Secondary | ICD-10-CM | POA: Diagnosis not present

## 2019-10-20 DIAGNOSIS — I5022 Chronic systolic (congestive) heart failure: Secondary | ICD-10-CM | POA: Diagnosis not present

## 2019-10-20 DIAGNOSIS — I110 Hypertensive heart disease with heart failure: Secondary | ICD-10-CM | POA: Diagnosis not present

## 2019-10-20 DIAGNOSIS — N764 Abscess of vulva: Secondary | ICD-10-CM | POA: Diagnosis not present

## 2019-10-20 DIAGNOSIS — H409 Unspecified glaucoma: Secondary | ICD-10-CM | POA: Diagnosis not present

## 2019-10-20 DIAGNOSIS — E1140 Type 2 diabetes mellitus with diabetic neuropathy, unspecified: Secondary | ICD-10-CM | POA: Diagnosis not present

## 2019-10-20 DIAGNOSIS — Z8673 Personal history of transient ischemic attack (TIA), and cerebral infarction without residual deficits: Secondary | ICD-10-CM | POA: Diagnosis not present

## 2019-10-20 DIAGNOSIS — G4733 Obstructive sleep apnea (adult) (pediatric): Secondary | ICD-10-CM | POA: Diagnosis not present

## 2019-10-21 DIAGNOSIS — E1165 Type 2 diabetes mellitus with hyperglycemia: Secondary | ICD-10-CM | POA: Diagnosis not present

## 2019-10-21 DIAGNOSIS — I10 Essential (primary) hypertension: Secondary | ICD-10-CM | POA: Diagnosis not present

## 2019-10-21 DIAGNOSIS — Z6841 Body Mass Index (BMI) 40.0 and over, adult: Secondary | ICD-10-CM | POA: Diagnosis not present

## 2019-10-21 DIAGNOSIS — E038 Other specified hypothyroidism: Secondary | ICD-10-CM | POA: Diagnosis not present

## 2019-10-21 DIAGNOSIS — I5022 Chronic systolic (congestive) heart failure: Secondary | ICD-10-CM | POA: Diagnosis not present

## 2019-10-21 DIAGNOSIS — I872 Venous insufficiency (chronic) (peripheral): Secondary | ICD-10-CM | POA: Diagnosis not present

## 2019-10-24 DIAGNOSIS — E1140 Type 2 diabetes mellitus with diabetic neuropathy, unspecified: Secondary | ICD-10-CM | POA: Diagnosis not present

## 2019-10-24 DIAGNOSIS — I5022 Chronic systolic (congestive) heart failure: Secondary | ICD-10-CM | POA: Diagnosis not present

## 2019-10-24 DIAGNOSIS — H409 Unspecified glaucoma: Secondary | ICD-10-CM | POA: Diagnosis not present

## 2019-10-24 DIAGNOSIS — I110 Hypertensive heart disease with heart failure: Secondary | ICD-10-CM | POA: Diagnosis not present

## 2019-10-24 DIAGNOSIS — G4733 Obstructive sleep apnea (adult) (pediatric): Secondary | ICD-10-CM | POA: Diagnosis not present

## 2019-10-24 DIAGNOSIS — N764 Abscess of vulva: Secondary | ICD-10-CM | POA: Diagnosis not present

## 2019-10-24 DIAGNOSIS — Z8673 Personal history of transient ischemic attack (TIA), and cerebral infarction without residual deficits: Secondary | ICD-10-CM | POA: Diagnosis not present

## 2019-10-24 DIAGNOSIS — Z4801 Encounter for change or removal of surgical wound dressing: Secondary | ICD-10-CM | POA: Diagnosis not present

## 2019-10-24 DIAGNOSIS — I2510 Atherosclerotic heart disease of native coronary artery without angina pectoris: Secondary | ICD-10-CM | POA: Diagnosis not present

## 2019-10-27 DIAGNOSIS — G4733 Obstructive sleep apnea (adult) (pediatric): Secondary | ICD-10-CM | POA: Diagnosis not present

## 2019-10-27 DIAGNOSIS — Z4801 Encounter for change or removal of surgical wound dressing: Secondary | ICD-10-CM | POA: Diagnosis not present

## 2019-10-27 DIAGNOSIS — I2510 Atherosclerotic heart disease of native coronary artery without angina pectoris: Secondary | ICD-10-CM | POA: Diagnosis not present

## 2019-10-27 DIAGNOSIS — I110 Hypertensive heart disease with heart failure: Secondary | ICD-10-CM | POA: Diagnosis not present

## 2019-10-27 DIAGNOSIS — H409 Unspecified glaucoma: Secondary | ICD-10-CM | POA: Diagnosis not present

## 2019-10-27 DIAGNOSIS — E1140 Type 2 diabetes mellitus with diabetic neuropathy, unspecified: Secondary | ICD-10-CM | POA: Diagnosis not present

## 2019-10-27 DIAGNOSIS — Z8673 Personal history of transient ischemic attack (TIA), and cerebral infarction without residual deficits: Secondary | ICD-10-CM | POA: Diagnosis not present

## 2019-10-27 DIAGNOSIS — I5022 Chronic systolic (congestive) heart failure: Secondary | ICD-10-CM | POA: Diagnosis not present

## 2019-10-27 DIAGNOSIS — N764 Abscess of vulva: Secondary | ICD-10-CM | POA: Diagnosis not present

## 2019-10-31 DIAGNOSIS — I5022 Chronic systolic (congestive) heart failure: Secondary | ICD-10-CM | POA: Diagnosis not present

## 2019-10-31 DIAGNOSIS — Z4801 Encounter for change or removal of surgical wound dressing: Secondary | ICD-10-CM | POA: Diagnosis not present

## 2019-10-31 DIAGNOSIS — N764 Abscess of vulva: Secondary | ICD-10-CM | POA: Diagnosis not present

## 2019-10-31 DIAGNOSIS — I110 Hypertensive heart disease with heart failure: Secondary | ICD-10-CM | POA: Diagnosis not present

## 2019-10-31 DIAGNOSIS — Z8673 Personal history of transient ischemic attack (TIA), and cerebral infarction without residual deficits: Secondary | ICD-10-CM | POA: Diagnosis not present

## 2019-10-31 DIAGNOSIS — I2510 Atherosclerotic heart disease of native coronary artery without angina pectoris: Secondary | ICD-10-CM | POA: Diagnosis not present

## 2019-10-31 DIAGNOSIS — H409 Unspecified glaucoma: Secondary | ICD-10-CM | POA: Diagnosis not present

## 2019-10-31 DIAGNOSIS — E1140 Type 2 diabetes mellitus with diabetic neuropathy, unspecified: Secondary | ICD-10-CM | POA: Diagnosis not present

## 2019-10-31 DIAGNOSIS — G4733 Obstructive sleep apnea (adult) (pediatric): Secondary | ICD-10-CM | POA: Diagnosis not present

## 2019-11-03 ENCOUNTER — Other Ambulatory Visit: Payer: Self-pay | Admitting: *Deleted

## 2019-11-03 MED ORDER — CARVEDILOL 6.25 MG PO TABS: 6.2500 mg | ORAL_TABLET | Freq: Two times a day (BID) | ORAL | 3 refills | Status: DC

## 2019-11-04 DIAGNOSIS — R69 Illness, unspecified: Secondary | ICD-10-CM | POA: Diagnosis not present

## 2019-11-07 DIAGNOSIS — I5022 Chronic systolic (congestive) heart failure: Secondary | ICD-10-CM | POA: Diagnosis not present

## 2019-11-07 DIAGNOSIS — H409 Unspecified glaucoma: Secondary | ICD-10-CM | POA: Diagnosis not present

## 2019-11-07 DIAGNOSIS — E1140 Type 2 diabetes mellitus with diabetic neuropathy, unspecified: Secondary | ICD-10-CM | POA: Diagnosis not present

## 2019-11-07 DIAGNOSIS — I2510 Atherosclerotic heart disease of native coronary artery without angina pectoris: Secondary | ICD-10-CM | POA: Diagnosis not present

## 2019-11-07 DIAGNOSIS — N764 Abscess of vulva: Secondary | ICD-10-CM | POA: Diagnosis not present

## 2019-11-07 DIAGNOSIS — G4733 Obstructive sleep apnea (adult) (pediatric): Secondary | ICD-10-CM | POA: Diagnosis not present

## 2019-11-07 DIAGNOSIS — I110 Hypertensive heart disease with heart failure: Secondary | ICD-10-CM | POA: Diagnosis not present

## 2019-11-07 DIAGNOSIS — Z4801 Encounter for change or removal of surgical wound dressing: Secondary | ICD-10-CM | POA: Diagnosis not present

## 2019-11-07 DIAGNOSIS — Z8673 Personal history of transient ischemic attack (TIA), and cerebral infarction without residual deficits: Secondary | ICD-10-CM | POA: Diagnosis not present

## 2019-11-08 DIAGNOSIS — N764 Abscess of vulva: Secondary | ICD-10-CM | POA: Diagnosis not present

## 2019-11-08 DIAGNOSIS — Z4801 Encounter for change or removal of surgical wound dressing: Secondary | ICD-10-CM | POA: Diagnosis not present

## 2019-11-08 DIAGNOSIS — Z8673 Personal history of transient ischemic attack (TIA), and cerebral infarction without residual deficits: Secondary | ICD-10-CM | POA: Diagnosis not present

## 2019-11-08 DIAGNOSIS — I2510 Atherosclerotic heart disease of native coronary artery without angina pectoris: Secondary | ICD-10-CM | POA: Diagnosis not present

## 2019-11-08 DIAGNOSIS — G4733 Obstructive sleep apnea (adult) (pediatric): Secondary | ICD-10-CM | POA: Diagnosis not present

## 2019-11-08 DIAGNOSIS — I110 Hypertensive heart disease with heart failure: Secondary | ICD-10-CM | POA: Diagnosis not present

## 2019-11-08 DIAGNOSIS — I5022 Chronic systolic (congestive) heart failure: Secondary | ICD-10-CM | POA: Diagnosis not present

## 2019-11-08 DIAGNOSIS — H409 Unspecified glaucoma: Secondary | ICD-10-CM | POA: Diagnosis not present

## 2019-11-08 DIAGNOSIS — E1140 Type 2 diabetes mellitus with diabetic neuropathy, unspecified: Secondary | ICD-10-CM | POA: Diagnosis not present

## 2019-11-12 ENCOUNTER — Telehealth: Payer: Self-pay | Admitting: Student

## 2019-11-12 NOTE — Telephone Encounter (Signed)

## 2019-11-14 ENCOUNTER — Encounter: Payer: Self-pay | Admitting: *Deleted

## 2019-11-14 ENCOUNTER — Encounter: Payer: Self-pay | Admitting: Student

## 2019-11-14 ENCOUNTER — Telehealth (INDEPENDENT_AMBULATORY_CARE_PROVIDER_SITE_OTHER): Payer: Medicare HMO | Admitting: Student

## 2019-11-14 VITALS — BP 120/74 | Ht 65.0 in

## 2019-11-14 DIAGNOSIS — I5042 Chronic combined systolic (congestive) and diastolic (congestive) heart failure: Secondary | ICD-10-CM

## 2019-11-14 DIAGNOSIS — I2510 Atherosclerotic heart disease of native coronary artery without angina pectoris: Secondary | ICD-10-CM

## 2019-11-14 DIAGNOSIS — I130 Hypertensive heart and chronic kidney disease with heart failure and stage 1 through stage 4 chronic kidney disease, or unspecified chronic kidney disease: Secondary | ICD-10-CM

## 2019-11-14 DIAGNOSIS — E785 Hyperlipidemia, unspecified: Secondary | ICD-10-CM | POA: Diagnosis not present

## 2019-11-14 DIAGNOSIS — N183 Chronic kidney disease, stage 3 unspecified: Secondary | ICD-10-CM | POA: Diagnosis not present

## 2019-11-14 DIAGNOSIS — I10 Essential (primary) hypertension: Secondary | ICD-10-CM

## 2019-11-14 NOTE — Progress Notes (Signed)
Virtual Visit via Telephone Note   This visit type was conducted due to national recommendations for restrictions regarding the COVID-19 Pandemic (e.g. social distancing) in an effort to limit this patient's exposure and mitigate transmission in our community.  Due to her co-morbid illnesses, this patient is at least at moderate risk for complications without adequate follow up.  This format is felt to be most appropriate for this patient at this time.  The patient did not have access to video technology/had technical difficulties with video requiring transitioning to audio format only (telephone).  All issues noted in this document were discussed and addressed.  No physical exam could be performed with this format.  Please refer to the patient's chart for her  consent to telehealth for [facility].   Date:  11/14/2019   ID:  Maureen Jackson, DOB [birth year], MRN [id number]  Patient Location: Home Provider Location: Office  PCP:  Neale Burly, MD  Cardiologist:  Carlyle Dolly, MD  Electrophysiologist:  None   Evaluation Performed:  Follow-Up Visit  Chief Complaint: Lower Extremity Edema  History of Present Illness:    Maureen Jackson is a 63 y.o. female with past medical history of CAD (s/p DES to LAD and RCA in 09/2017), ischemic cardiomyopathy (EF 30-35% in 09/2017, at 35-40% by repeat in 12/2018), HTN, HLD, Type 2 DM and obesity who presents for an overdue follow-up telehealth visit.   She was last examined by Dr. Harl Bowie in 05/3029 and denied any recent chest pain or dyspnea on exertion. Weight had increased from 326 lbs in 11/2018 to 343 lbs at the time of her visit which was thought to be secondary to dietary indiscretion. No changes were made to her diuretic regimen and she was continued on Lasix 40mg  daily.   In the interim, she was admitted to [facility] from 09/08/2019 - 10/02/2019 for sepsis in the setting of a labial abscess and Fournier gangrene. She required I&D on  09/16/2019 and completed a course of antibiotics during admission. She did have an AKI during admission requiring intermittent HD which was thought to be secondary to ATN and Vancomycin nephrotoxicity. Entresto and Spironolactone were held at the time of discharge secondary to this. Creatinine was 2.19 on most recent check on 10/04/2019.  In talking with the patient today, she reports having returned home at the end of October and has overall been doing well. She is being followed by home health as they are performing dressing changes following her I&D in 09/2019 and she reports the wound has almost healed. Says that breathing has been at baseline and she denies any specific dyspnea on exertion, orthopnea, PND, chest pain or palpitations.  Her biggest concern today is she continues to experience lower extremity edema. Says this worsened during her hospitalization and was thought to be secondary to an allergic reaction to antibiotic therapy. She was restarted on Lasix, Spironolactone and Entresto by her PCP a few weeks ago. Had blood work approximately 2 weeks ago and was informed everything was "normal". She has not been weighing herself daily but says that weight has overall been stable when checked.  The patient does not have symptoms concerning for COVID-19 infection (fever, chills, cough, or new shortness of breath).    Past Medical History:  Diagnosis Date  . 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 Bleeding from mouth    when she brushed teeth or ate  . Chronic systolic CHF (congestive heart failure) ([place])   .  Coronary artery disease 09/2017   a. multivessel CAD by cath in 09/2017 and not felt to be a CABG candidate --> underwent two-vessel PCI with DES to the LAD and DES to the RCA  . Diabetes mellitus   . Dyspnea   . Glaucoma   . Hypertension   . Left bundle branch block   . Morbid obesity ([place])   . Obstructive sleep apnea    does not wear CPAP  . Thyroid disease    Past Surgical History:   Procedure Laterality Date  . ABDOMINAL HYSTERECTOMY    . CARDIAC CATHETERIZATION  09/12/2017  . CORONARY STENT INTERVENTION  09/12/2017   STENT RESOLUTE ONYX [id number] drug eluting stent was successfully placed  . CORONARY STENT INTERVENTION N/A 09/12/2017   Procedure: CORONARY STENT INTERVENTION;  Surgeon: Leonie Man, MD;  Location: [place] [facility];  Service: Cardiovascular;  Laterality: N/A;  . INCISION AND DRAINAGE PERIRECTAL ABSCESS Left 09/16/2019   Procedure: IRRIGATION AND DEBRIDEMENT LABIA ABSCESS;  Surgeon: Coralie Keens, MD;  Location: [place];  Service: General;  Laterality: Left;  . IR FLUORO GUIDE CV LINE RIGHT  09/16/2019  . IR US GUIDE VASC ACCESS RIGHT  09/16/2019  . LEFT HEART CATH AND CORONARY ANGIOGRAPHY N/A 09/12/2017   Procedure: LEFT HEART CATH AND CORONARY ANGIOGRAPHY;  Surgeon: Leonie Man, MD;  Location: [place] [facility];  Service: Cardiovascular;  Laterality: N/A;  . MASS EXCISION N/A 12/18/2018   Procedure: EXCISION TONGUE MASS;  Surgeon: Leta Baptist, MD;  Location: [place] OR;  Service: ENT;  Laterality: N/A;  . RIGHT/LEFT HEART CATH AND CORONARY ANGIOGRAPHY N/A 09/10/2017   Procedure: RIGHT/LEFT HEART CATH AND CORONARY ANGIOGRAPHY;  Surgeon: Troy Sine, MD;  Location: [place] [facility];  Service: Cardiovascular;  Laterality: N/A;  . THYROID SURGERY       Current Meds  Medication Sig  . aspirin EC 81 MG tablet Take 81 mg by mouth daily.  Marland Kitchen atorvastatin (LIPITOR) 80 MG tablet TAKE 1 TABLET(80 MG) BY MOUTH DAILY AT 6 PM  . carvedilol (COREG) 6.25 MG tablet Take 1 tablet (6.25 mg total) by mouth 2 (two) times daily with a meal.  . cetirizine (ZYRTEC) 10 MG tablet Take 10 mg by mouth daily as needed for allergies.   Marland Kitchen clopidogrel (PLAVIX) 75 MG tablet TAKE 1 TABLET(75 MG) BY MOUTH DAILY WITH BREAKFAST (Patient taking differently: Take 75 mg by mouth daily. )  . doxepin (SINEQUAN) 10 MG capsule Take 10 mg by mouth at bedtime.  Marland Kitchen ENTRESTO 97-103 MG Take 1  tablet by mouth 2 (two) times daily.  . furosemide (LASIX) 40 MG tablet Take 1 tablet (40 mg total) by mouth daily.  Marland Kitchen JARDIANCE 10 MG TABS tablet Take 10 mg by mouth daily.  Marland Kitchen levothyroxine (SYNTHROID, LEVOTHROID) 125 MCG tablet Take 125 mcg by mouth daily before breakfast.   . NOVOLOG FLEXPEN 100 UNIT/ML FlexPen Inject 15 Units into the skin 3 (three) times daily with meals.   . nystatin (NYSTATIN) powder Apply topically See admin instructions. Apply to groin area every shift for moisture-associated rash  . ondansetron (ZOFRAN ODT) 4 MG disintegrating tablet Take 1 tablet (4 mg total) by mouth every 8 (eight) hours as needed. (Patient taking differently: Take 4 mg by mouth every 8 (eight) hours as needed for nausea or vomiting. )  . spironolactone (ALDACTONE) 25 MG tablet Take 25 mg by mouth daily.  . timolol (TIMOPTIC) 0.5 % ophthalmic solution Place 1 drop into both  eyes 2 (two) times daily.  . traMADol (ULTRAM) 50 MG tablet Take 1 tablet (50 mg total) by mouth every 6 (six) hours as needed for severe pain. (Patient taking differently: Take 50 mg by mouth every 6 (six) hours as needed for moderate pain or severe pain. )  . TRESIBA FLEXTOUCH 100 UNIT/ML SOPN FlexTouch Pen SMARTSIG:60 Unit(s) SUB-Q Daily  . VENTOLIN HFA 108 (90 Base) MCG/ACT inhaler Inhale 1-2 puffs into the lungs every 6 (six) hours as needed for wheezing or shortness of breath. (Patient taking differently: Inhale 2 puffs into the lungs every 6 (six) hours as needed for wheezing or shortness of breath. )     Allergies:   Ampicillin, Hydrocodone, Aspirin, and Unasyn [ampicillin-sulbactam sodium]   Social History   Tobacco Use  . Smoking status: Former Smoker    Quit date: 12/14/2005    Years since quitting: 13.9  . Smokeless tobacco: Never Used  . Tobacco comment: QUIT IN 2005  Substance Use Topics  . Alcohol use: No  . Drug use: No     Family Hx: The patient's family history includes Cancer in her mother; Diabetes in  her mother; Hypertension in her mother and sister.  ROS:   Please see the history of present illness.     All other systems reviewed and are negative.   Prior CV studies:   The following studies were reviewed today:  Limited Echo: 12/16/2018 Study Conclusions  - Limited study to evaluate LV function. - Technically difficult study, poor visualization. Technical issues   with attempts at echo contrast injections, inadequate contrast   effect on imaging despite multiple doses. - LVEF appears grossly in the 35-40% range.  Cardiac Catheterization: 09/2017  Prox RCA lesion, 30 %stenosed.  Prox RCA to Mid RCA lesion, 70 %stenosed.  Mid RCA lesion, 90 %stenosed.  RPDA lesion, 80 %stenosed.  Prox Cx lesion, 20 %stenosed.  Ost 1st Mrg lesion, 80 %stenosed.  1st Mrg-2 lesion, 80 %stenosed.  1st Mrg-1 lesion, 90 %stenosed.  Mid Cx-2 lesion, 50 %stenosed.  Mid Cx-1 lesion, 60 %stenosed.  Prox LAD lesion, 60 %stenosed.  Mid LAD lesion, 85 %stenosed.   Severe multivessel CAD with 60% proximal LAD stenosis and 85% mid LAD stenosis; 80, 90 and 80%  OM1 stenosis of the left circumflex with 60% mid AV groove and 50% mid distal AV groove stenosis; dominant RCA with 30% proximal followed by 70% proximal stenosis, 90% mid stenosis, and diffuse mid distal PDA stenosis of at least 80%.  LVEDP 33 mmHg;  EF by previous echo 30-35%; left ventriculography not performed.  Moderate right heart pressure elevation with mild/moderate pulmonary hypertension.  RECOMMENDATION: In this diabetic female with significant multivessel CAD, recommend surgical consultation for consideration for CABG revascularization.   Coronary Stent Intervention: 09/2019  LESION #1  Prox LAD lesion, 60 %stenosed. Prox LAD to Mid LAD lesion, 65 %stenosed. Mid LAD lesion, 85 %stenosed. Tandem Lesions.  A STENT RESOLUTE ONYX [id number] drug eluting stent was successfully placed. Post-dilated in tapered fashion: 3.6  - 2.9 mm  Post intervention, there is a 0% residual stenosis.  LESION $2  Prox RCA lesion, 45 %stenosed. Prox RCA to Mid RCA lesion, 70 %stenosed. Mid RCA lesion, 90 %stenosed. Tandem Lesions  A STENT RESOLUTE ONYX [id number] drug eluting stent and STENT RESOLUTE ONYX 2.75X26 drug-eluting stent were successfully placed in overlapping fashion.  Post intervention, there is a 0% residual stenosis. Post-dilated in tapered fashion: 3.6 - 3.1 mm  RPDA lesion, 80 %stenosed. Plan  Medical management.   Successful 2 vessel PCI with stable circumflex disease. Plan is to treat circumflex flex disease medically for now.   Plan: Return to nursing unit for post PCI care TR band removal. Continue to optimize medical management, but from a post Standpoint she will be able to be discharged tomorrow. Continue aspirin plus Plavix for minimum one year, but would continue Plavix lifelong  Labs/Other Tests and Data Reviewed:    EKG:  An ECG dated 06/10/2019 was personally reviewed today and demonstrated:  NSR, HR 70 with known LBBB.   Recent Labs: 09/11/2019: ALT 42 10/04/2019: BUN 48; Creatinine, Ser 2.19; Hemoglobin 8.7; Platelets 158; Potassium 5.0; Sodium 141   Recent Lipid Panel Lab Results  Component Value Date/Time   CHOL 147 07/17/2018 01:52 AM   TRIG 167 (H) 07/17/2018 01:52 AM   HDL 31 (L) 07/17/2018 01:52 AM   CHOLHDL 4.7 07/17/2018 01:52 AM   LDLCALC 83 07/17/2018 01:52 AM    Wt Readings from Last 3 Encounters:  10/02/19 (!) 344 lb 4.8 oz (156.2 kg)  09/06/19 (!) 335 lb (152 kg)  06/10/19 (!) 343 lb 3.2 oz (155.7 kg)     Objective:    Vital Signs:  BP 120/74   Ht 5\' 5"  (1.651 m)   BMI 57.29 kg/m    General: Pleasant female sounding in NAD Psych: Normal affect. Neuro: Alert and oriented X 3.  Lungs:  Resp regular and unlabored while talking on the phone.    ASSESSMENT & PLAN:    1. 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 Labs were checked by her PCP several weeks ago and she was informed to restart the above medications. I will request a copy of these records. - Continue current medication regimen at this time including Coreg 6.25 mg twice daily, Entresto 97-103 mg twice daily, Lasix 40 mg daily and Spironolactone 25 mg daily. We reviewed that she can take an extra Lasix tablet if needed for weight gain greater than 3 pounds overnight or 5 pounds in 1 week.   2. CAD - s/p DES to LAD and RCA in 09/2017. She denies any recent chest pain and reports her breathing has returned to baseline. - continue ASA, Plavix (recommeneded to continue lifelong at the time of her last cath), BB and statin therapy.   3. HLD - followed by PCP. Will request a copy of recent labs as outlined above. She remains on Atorvastatin 80 mg daily with goal LDL less than 70 in the setting of known CAD.  4. HTN - BP was well controlled at 120/74 when checked earlier today. Continue current medication regimen.  5. Stage 3 CKD - she had an AKI during her most recent admission and required intermittent HD. Creatinine was at 2.19 by most recent labs on 10/04/2019. She reports having been told her kidneys have normalized since. Will request a copy of labs from PCP.   COVID-19 Education: The signs and symptoms of COVID-19 were discussed with the patient and how to seek care for testing (follow up with PCP or arrange E-visit).  The importance of social distancing was discussed today.  Time:   Today, I have spent 16 minutes with the patient with telehealth technology discussing the above problems.     Medication Adjustments/Labs and Tests Ordered:  Current medicines are reviewed at length with the patient today.  Concerns regarding medicines are outlined  above.   Tests Ordered: No orders of the defined types were placed in this encounter.   Medication Changes: No orders of the defined types were placed in this encounter.   Follow Up: She did request an office visit given her worsening edema since her hospitalization in 09/2019. Will arrange for an IN OFFICE visit within 4-8 weeks.   Signed, Erma Heritage, PA-C  11/14/2019 4:37 PM    [facility]

## 2019-11-14 NOTE — Patient Instructions (Signed)
Medication Instructions:  Your physician recommends that you continue on your current medications as directed. Please refer to the Current Medication list given to you today.  *If you need a refill on your cardiac medications before your next appointment, please call your pharmacy*  Lab Work: NONE   If you have labs (blood work) drawn today and your tests are completely normal, you will receive your results only by: Marland Kitchen MyChart Message (if you have MyChart) OR . A paper copy in the mail If you have any lab test that is abnormal or we need to change your treatment, we will call you to review the results.  Testing/Procedures: NONE   Follow-Up: At [facility], you and your health needs are our priority.  As part of our continuing mission to provide you with exceptional heart care, we have created designated Provider Care Teams.  These Care Teams include your primary Cardiologist (physician) and Advanced Practice Providers (APPs -  Physician Assistants and Nurse Practitioners) who all work together to provide you with the care you need, when you need it.  Your next appointment:   1-2 month(s)  The format for your next appointment:   In Person  Provider:   You may see Maureen Dolly, MD or one of the following Advanced Practice Providers on your designated Care Team:    Maureen Pho, PA-C   Maureen Jackson, [place]    Other Instructions Thank you for choosing [facility]!

## 2019-11-19 DIAGNOSIS — G4733 Obstructive sleep apnea (adult) (pediatric): Secondary | ICD-10-CM | POA: Diagnosis not present

## 2019-11-19 DIAGNOSIS — N764 Abscess of vulva: Secondary | ICD-10-CM | POA: Diagnosis not present

## 2019-11-19 DIAGNOSIS — I2510 Atherosclerotic heart disease of native coronary artery without angina pectoris: Secondary | ICD-10-CM | POA: Diagnosis not present

## 2019-11-19 DIAGNOSIS — E1140 Type 2 diabetes mellitus with diabetic neuropathy, unspecified: Secondary | ICD-10-CM | POA: Diagnosis not present

## 2019-11-19 DIAGNOSIS — I5022 Chronic systolic (congestive) heart failure: Secondary | ICD-10-CM | POA: Diagnosis not present

## 2019-11-19 DIAGNOSIS — Z8673 Personal history of transient ischemic attack (TIA), and cerebral infarction without residual deficits: Secondary | ICD-10-CM | POA: Diagnosis not present

## 2019-11-19 DIAGNOSIS — H409 Unspecified glaucoma: Secondary | ICD-10-CM | POA: Diagnosis not present

## 2019-11-19 DIAGNOSIS — I110 Hypertensive heart disease with heart failure: Secondary | ICD-10-CM | POA: Diagnosis not present

## 2019-11-19 DIAGNOSIS — Z4801 Encounter for change or removal of surgical wound dressing: Secondary | ICD-10-CM | POA: Diagnosis not present

## 2019-11-26 DIAGNOSIS — I110 Hypertensive heart disease with heart failure: Secondary | ICD-10-CM | POA: Diagnosis not present

## 2019-11-26 DIAGNOSIS — Z8673 Personal history of transient ischemic attack (TIA), and cerebral infarction without residual deficits: Secondary | ICD-10-CM | POA: Diagnosis not present

## 2019-11-26 DIAGNOSIS — E1140 Type 2 diabetes mellitus with diabetic neuropathy, unspecified: Secondary | ICD-10-CM | POA: Diagnosis not present

## 2019-11-26 DIAGNOSIS — G4733 Obstructive sleep apnea (adult) (pediatric): Secondary | ICD-10-CM | POA: Diagnosis not present

## 2019-11-26 DIAGNOSIS — Z4801 Encounter for change or removal of surgical wound dressing: Secondary | ICD-10-CM | POA: Diagnosis not present

## 2019-11-26 DIAGNOSIS — I2510 Atherosclerotic heart disease of native coronary artery without angina pectoris: Secondary | ICD-10-CM | POA: Diagnosis not present

## 2019-11-26 DIAGNOSIS — H409 Unspecified glaucoma: Secondary | ICD-10-CM | POA: Diagnosis not present

## 2019-11-26 DIAGNOSIS — I5022 Chronic systolic (congestive) heart failure: Secondary | ICD-10-CM | POA: Diagnosis not present

## 2019-11-26 DIAGNOSIS — N764 Abscess of vulva: Secondary | ICD-10-CM | POA: Diagnosis not present

## 2019-12-26 ENCOUNTER — Other Ambulatory Visit: Payer: Self-pay

## 2019-12-26 ENCOUNTER — Emergency Department (HOSPITAL_COMMUNITY): Payer: Medicare HMO

## 2019-12-26 ENCOUNTER — Inpatient Hospital Stay (HOSPITAL_COMMUNITY)
Admission: EM | Admit: 2019-12-26 | Discharge: 2019-12-31 | DRG: 177 | Disposition: A | Discharge status: Home / Self Care | Payer: Medicare HMO | Attending: Internal Medicine | Admitting: Internal Medicine

## 2019-12-26 ENCOUNTER — Encounter (HOSPITAL_COMMUNITY): Payer: Self-pay | Admitting: Emergency Medicine

## 2019-12-26 DIAGNOSIS — Z6841 Body Mass Index (BMI) 40.0 and over, adult: Secondary | ICD-10-CM

## 2019-12-26 DIAGNOSIS — J440 Chronic obstructive pulmonary disease with acute lower respiratory infection: Secondary | ICD-10-CM | POA: Diagnosis not present

## 2019-12-26 DIAGNOSIS — G4733 Obstructive sleep apnea (adult) (pediatric): Secondary | ICD-10-CM | POA: Diagnosis present

## 2019-12-26 DIAGNOSIS — Z79899 Other long term (current) drug therapy: Secondary | ICD-10-CM

## 2019-12-26 DIAGNOSIS — Z885 Allergy status to narcotic agent status: Secondary | ICD-10-CM

## 2019-12-26 DIAGNOSIS — K219 Gastro-esophageal reflux disease without esophagitis: Secondary | ICD-10-CM | POA: Diagnosis not present

## 2019-12-26 DIAGNOSIS — Z8673 Personal history of transient ischemic attack (TIA), and cerebral infarction without residual deficits: Secondary | ICD-10-CM

## 2019-12-26 DIAGNOSIS — Z7902 Long term (current) use of antithrombotics/antiplatelets: Secondary | ICD-10-CM

## 2019-12-26 DIAGNOSIS — N179 Acute kidney failure, unspecified: Secondary | ICD-10-CM | POA: Diagnosis not present

## 2019-12-26 DIAGNOSIS — I447 Left bundle-branch block, unspecified: Secondary | ICD-10-CM | POA: Diagnosis present

## 2019-12-26 DIAGNOSIS — N184 Chronic kidney disease, stage 4 (severe): Secondary | ICD-10-CM | POA: Diagnosis present

## 2019-12-26 DIAGNOSIS — Z8249 Family history of ischemic heart disease and other diseases of the circulatory system: Secondary | ICD-10-CM

## 2019-12-26 DIAGNOSIS — R7982 Elevated C-reactive protein (CRP): Secondary | ICD-10-CM | POA: Diagnosis present

## 2019-12-26 DIAGNOSIS — E039 Hypothyroidism, unspecified: Secondary | ICD-10-CM | POA: Diagnosis not present

## 2019-12-26 DIAGNOSIS — I10 Essential (primary) hypertension: Secondary | ICD-10-CM | POA: Diagnosis present

## 2019-12-26 DIAGNOSIS — Z833 Family history of diabetes mellitus: Secondary | ICD-10-CM

## 2019-12-26 DIAGNOSIS — I5042 Chronic combined systolic (congestive) and diastolic (congestive) heart failure: Secondary | ICD-10-CM | POA: Diagnosis not present

## 2019-12-26 DIAGNOSIS — Z7982 Long term (current) use of aspirin: Secondary | ICD-10-CM

## 2019-12-26 DIAGNOSIS — R05 Cough: Secondary | ICD-10-CM | POA: Diagnosis not present

## 2019-12-26 DIAGNOSIS — J069 Acute upper respiratory infection, unspecified: Secondary | ICD-10-CM | POA: Diagnosis present

## 2019-12-26 DIAGNOSIS — I5043 Acute on chronic combined systolic (congestive) and diastolic (congestive) heart failure: Secondary | ICD-10-CM | POA: Diagnosis not present

## 2019-12-26 DIAGNOSIS — E1165 Type 2 diabetes mellitus with hyperglycemia: Secondary | ICD-10-CM

## 2019-12-26 DIAGNOSIS — U071 COVID-19: Secondary | ICD-10-CM | POA: Diagnosis not present

## 2019-12-26 DIAGNOSIS — E118 Type 2 diabetes mellitus with unspecified complications: Secondary | ICD-10-CM

## 2019-12-26 DIAGNOSIS — I130 Hypertensive heart and chronic kidney disease with heart failure and stage 1 through stage 4 chronic kidney disease, or unspecified chronic kidney disease: Secondary | ICD-10-CM | POA: Diagnosis present

## 2019-12-26 DIAGNOSIS — E1122 Type 2 diabetes mellitus with diabetic chronic kidney disease: Secondary | ICD-10-CM | POA: Diagnosis present

## 2019-12-26 DIAGNOSIS — I255 Ischemic cardiomyopathy: Secondary | ICD-10-CM | POA: Diagnosis present

## 2019-12-26 DIAGNOSIS — Z955 Presence of coronary angioplasty implant and graft: Secondary | ICD-10-CM

## 2019-12-26 DIAGNOSIS — E1159 Type 2 diabetes mellitus with other circulatory complications: Secondary | ICD-10-CM | POA: Diagnosis present

## 2019-12-26 DIAGNOSIS — R0602 Shortness of breath: Secondary | ICD-10-CM | POA: Diagnosis not present

## 2019-12-26 DIAGNOSIS — Z881 Allergy status to other antibiotic agents status: Secondary | ICD-10-CM

## 2019-12-26 DIAGNOSIS — J9601 Acute respiratory failure with hypoxia: Secondary | ICD-10-CM | POA: Diagnosis not present

## 2019-12-26 DIAGNOSIS — H409 Unspecified glaucoma: Secondary | ICD-10-CM | POA: Diagnosis present

## 2019-12-26 DIAGNOSIS — E785 Hyperlipidemia, unspecified: Secondary | ICD-10-CM | POA: Diagnosis present

## 2019-12-26 DIAGNOSIS — E890 Postprocedural hypothyroidism: Secondary | ICD-10-CM | POA: Diagnosis present

## 2019-12-26 DIAGNOSIS — Z794 Long term (current) use of insulin: Secondary | ICD-10-CM

## 2019-12-26 DIAGNOSIS — I2510 Atherosclerotic heart disease of native coronary artery without angina pectoris: Secondary | ICD-10-CM | POA: Diagnosis present

## 2019-12-26 DIAGNOSIS — Z79891 Long term (current) use of opiate analgesic: Secondary | ICD-10-CM

## 2019-12-26 DIAGNOSIS — Z90710 Acquired absence of both cervix and uterus: Secondary | ICD-10-CM

## 2019-12-26 DIAGNOSIS — Z886 Allergy status to analgesic agent status: Secondary | ICD-10-CM

## 2019-12-26 DIAGNOSIS — IMO0002 Reserved for concepts with insufficient information to code with codable children: Secondary | ICD-10-CM | POA: Diagnosis present

## 2019-12-26 DIAGNOSIS — J1282 Pneumonia due to coronavirus disease 2019: Secondary | ICD-10-CM | POA: Diagnosis present

## 2019-12-26 DIAGNOSIS — Z87891 Personal history of nicotine dependence: Secondary | ICD-10-CM

## 2019-12-26 LAB — URINALYSIS, ROUTINE W REFLEX MICROSCOPIC
Bilirubin Urine: NEGATIVE
Glucose, UA: NEGATIVE mg/dL
Hgb urine dipstick: NEGATIVE
Ketones, ur: 5 mg/dL — AB
Leukocytes,Ua: NEGATIVE
Nitrite: NEGATIVE
Protein, ur: 100 mg/dL — AB
Specific Gravity, Urine: 1.024 (ref 1.005–1.030)
pH: 5 (ref 5.0–8.0)

## 2019-12-26 LAB — CBC WITH DIFFERENTIAL/PLATELET
Abs Immature Granulocytes: 0.01 10*3/uL (ref 0.00–0.07)
Basophils Absolute: 0 10*3/uL (ref 0.0–0.1)
Basophils Relative: 0 %
Eosinophils Absolute: 0 10*3/uL (ref 0.0–0.5)
Eosinophils Relative: 0 %
HCT: 34.3 % — ABNORMAL LOW (ref 36.0–46.0)
Hemoglobin: 10.9 g/dL — ABNORMAL LOW (ref 12.0–15.0)
Immature Granulocytes: 0 %
Lymphocytes Relative: 35 %
Lymphs Abs: 1.8 10*3/uL (ref 0.7–4.0)
MCH: 29.1 pg (ref 26.0–34.0)
MCHC: 31.8 g/dL (ref 30.0–36.0)
MCV: 91.5 fL (ref 80.0–100.0)
Monocytes Absolute: 0.4 10*3/uL (ref 0.1–1.0)
Monocytes Relative: 8 %
Neutro Abs: 2.9 10*3/uL (ref 1.7–7.7)
Neutrophils Relative %: 57 %
Platelets: 159 10*3/uL (ref 150–400)
RBC: 3.75 MIL/uL — ABNORMAL LOW (ref 3.87–5.11)
RDW: 12.9 % (ref 11.5–15.5)
WBC: 5.2 10*3/uL (ref 4.0–10.5)
nRBC: 0 % (ref 0.0–0.2)

## 2019-12-26 LAB — CBG MONITORING, ED: Glucose-Capillary: 145 mg/dL — ABNORMAL HIGH (ref 70–99)

## 2019-12-26 LAB — COMPREHENSIVE METABOLIC PANEL
ALT: 38 U/L (ref 0–44)
AST: 33 U/L (ref 15–41)
Albumin: 3.4 g/dL — ABNORMAL LOW (ref 3.5–5.0)
Alkaline Phosphatase: 320 U/L — ABNORMAL HIGH (ref 38–126)
Anion gap: 10 (ref 5–15)
BUN: 28 mg/dL — ABNORMAL HIGH (ref 8–23)
CO2: 22 mmol/L (ref 22–32)
Calcium: 8 mg/dL — ABNORMAL LOW (ref 8.9–10.3)
Chloride: 106 mmol/L (ref 98–111)
Creatinine, Ser: 1.75 mg/dL — ABNORMAL HIGH (ref 0.44–1.00)
GFR calc Af Amer: 36 mL/min — ABNORMAL LOW (ref >60)
GFR calc non Af Amer: 31 mL/min — ABNORMAL LOW (ref >60)
Glucose, Bld: 180 mg/dL — ABNORMAL HIGH (ref 70–99)
Potassium: 4 mmol/L (ref 3.5–5.1)
Sodium: 138 mmol/L (ref 135–145)
Total Bilirubin: 1.1 mg/dL (ref 0.3–1.2)
Total Protein: 8.3 g/dL — ABNORMAL HIGH (ref 6.5–8.1)

## 2019-12-26 LAB — LACTATE DEHYDROGENASE: LDH: 255 U/L — ABNORMAL HIGH (ref 98–192)

## 2019-12-26 LAB — BRAIN NATRIURETIC PEPTIDE: B Natriuretic Peptide: 50 pg/mL (ref 0.0–100.0)

## 2019-12-26 LAB — LACTIC ACID, PLASMA
Lactic Acid, Venous: 1 mmol/L (ref 0.5–1.9)
Lactic Acid, Venous: 1.2 mmol/L (ref 0.5–1.9)

## 2019-12-26 LAB — FERRITIN: Ferritin: 188 ng/mL (ref 11–307)

## 2019-12-26 LAB — C-REACTIVE PROTEIN: CRP: 10.5 mg/dL — ABNORMAL HIGH (ref <1.0)

## 2019-12-26 LAB — D-DIMER, QUANTITATIVE: D-Dimer, Quant: 0.67 ug/mL-FEU — ABNORMAL HIGH (ref 0.00–0.50)

## 2019-12-26 LAB — FIBRINOGEN: Fibrinogen: 653 mg/dL — ABNORMAL HIGH (ref 210–475)

## 2019-12-26 LAB — POC SARS CORONAVIRUS 2 AG -  ED: SARS Coronavirus 2 Ag: POSITIVE — AB

## 2019-12-26 LAB — PROCALCITONIN: Procalcitonin: 0.15 ng/mL

## 2019-12-26 LAB — TRIGLYCERIDES: Triglycerides: 124 mg/dL (ref <150)

## 2019-12-26 MED ORDER — AEROCHAMBER PLUS FLO-VU MEDIUM MISC
1.0000 | Freq: Once | Status: AC
  Administered 2019-12-26: 17:00:00 1
  Filled 2019-12-26: qty 1

## 2019-12-26 MED ORDER — SODIUM CHLORIDE 0.9 % IV SOLN
200.0000 mg | Freq: Once | INTRAVENOUS | Status: AC
  Administered 2019-12-26: 22:00:00 200 mg via INTRAVENOUS
  Filled 2019-12-26: qty 40

## 2019-12-26 MED ORDER — ALBUTEROL SULFATE HFA 108 (90 BASE) MCG/ACT IN AERS
2.0000 | INHALATION_SPRAY | Freq: Once | RESPIRATORY_TRACT | Status: AC
  Administered 2019-12-26: 17:00:00 2 via RESPIRATORY_TRACT
  Filled 2019-12-26: qty 6.7

## 2019-12-26 MED ORDER — DEXAMETHASONE SODIUM PHOSPHATE 10 MG/ML IJ SOLN
6.0000 mg | Freq: Once | INTRAMUSCULAR | Status: AC
  Administered 2019-12-26: 6 mg via INTRAVENOUS
  Filled 2019-12-26: qty 1

## 2019-12-26 MED ORDER — SODIUM CHLORIDE 0.9 % IV SOLN
100.0000 mg | Freq: Every day | INTRAVENOUS | Status: AC
  Administered 2019-12-27 – 2019-12-30 (×4): 100 mg via INTRAVENOUS
  Filled 2019-12-26 (×7): qty 20

## 2019-12-26 MED ORDER — ACETAMINOPHEN 325 MG PO TABS
650.0000 mg | ORAL_TABLET | Freq: Once | ORAL | Status: AC
  Administered 2019-12-26: 17:00:00 650 mg via ORAL
  Filled 2019-12-26: qty 2

## 2019-12-26 NOTE — ED Triage Notes (Signed)
Pt reports cough, generalized weakness, runny nose for last several days. Pt denies chest pain, shortness of breath.

## 2019-12-26 NOTE — ED Notes (Signed)
Pam, RN received report

## 2019-12-26 NOTE — ED Notes (Signed)
Xray in for portable chest

## 2019-12-26 NOTE — ED Notes (Signed)
Unable to obtain secondary IV site.

## 2019-12-26 NOTE — Progress Notes (Signed)
  Pharmacy Antibiotic Note-Redemsivir Dosing  Maureen Jackson is a 64 y.o. female admitted on 12/26/2019 with COVID.  Pharmacy has been consulted for Remdesivir dosing.  Plan:  Remdesivir 200 mg IV load follow by 100 mg IV q 24 hr days 2-5.  ALT:  38 CXR: Pulmonary vascular congestion. Diffuse interstitial and alveolar airspace opacities throughout both lungs. No large pleural fluid collection. No pneumothorax.  Patient meets criteria for remdesivir.  Monitor ALT, clinical progress   Antimicrobials this admission: Remdesivir 1/15>>    Thank you for allowing pharmacy to be a part of this patient's care.  Blenda Nicely 12/26/2019 8:51 PM

## 2019-12-26 NOTE — H&P (Signed)
History and Physical  Maureen Jackson DGL:[id number] DOB: 1956/10/31 DOA: 12/26/2019  Referring physician: Kennith Maes, PA-C, ED physician PCP: Maureen Burly, MD  Outpatient Specialists:   Patient Coming From: home  Chief Complaint: Shortness of breath, fever  HPI: Maureen Jackson is a 64 y.o. female with a history of COPD/asthma, coronary artery disease with stents, chronic systolic heart failure with ischemic cardiomyopathy and EF of approximately 35%.  Hypertension, thyroid disease, type 2 diabetes.  Patient seen for worsening shortness of breath over the past 2 to 3 days.  She has possible contacts of her grandson and her daughter who are in and out of her home.  Due to increasing shortness of breath that is worse with exertion, the patient came to emergency department for evaluation.  The patient does not feel that this is consistent with her normal CHF.  Emergency Department Course: Covid positive.  Possible pulmonary vascular congestion with possible superimposed infectious process.  Patient febrile.  CRP elevated to 10.5.  Lactic acid normal.  Procalcitonin 0.15.  LDH 255 normal BNP.  Review of Systems:   Pt denies any chills, nausea, vomiting, diarrhea, constipation, abdominal pain, palpitations, headache, vision changes, lightheadedness, dizziness, melena, rectal bleeding.  Review of systems are otherwise negative  Past Medical History:  Diagnosis Date  . 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 Bleeding from mouth    when she brushed teeth or ate  . Chronic systolic CHF (congestive heart failure) ([place])   . Coronary artery disease 09/2017   a. multivessel CAD by cath in 09/2017 and not felt to be a CABG candidate --> underwent two-vessel PCI with DES to the LAD and DES to the RCA  . Diabetes mellitus   . Dyspnea   . Glaucoma   . Hypertension   . Left bundle branch block   . Morbid obesity ([place])   . Obstructive sleep apnea    does not wear CPAP  . Thyroid disease     Past Surgical History:  Procedure Laterality Date  . ABDOMINAL HYSTERECTOMY    . CARDIAC CATHETERIZATION  09/12/2017  . CORONARY STENT INTERVENTION  09/12/2017   STENT RESOLUTE ONYX 5.18A41 drug eluting stent was successfully placed  . CORONARY STENT INTERVENTION N/A 09/12/2017   Procedure: CORONARY STENT INTERVENTION;  Surgeon: Leonie Man, MD;  Location: [facility];  Service: Cardiovascular;  Laterality: N/A;  . INCISION AND DRAINAGE PERIRECTAL ABSCESS Left 09/16/2019   Procedure: IRRIGATION AND DEBRIDEMENT LABIA ABSCESS;  Surgeon: Coralie Keens, MD;  Location: [facility];  Service: General;  Laterality: Left;  . IR FLUORO GUIDE CV LINE RIGHT  09/16/2019  . IR US GUIDE VASC ACCESS RIGHT  09/16/2019  . LEFT HEART CATH AND CORONARY ANGIOGRAPHY N/A 09/12/2017   Procedure: LEFT HEART CATH AND CORONARY ANGIOGRAPHY;  Surgeon: Leonie Man, MD;  Location: [facility];  Service: Cardiovascular;  Laterality: N/A;  . MASS EXCISION N/A 12/18/2018   Procedure: EXCISION TONGUE MASS;  Surgeon: Leta Baptist, MD;  Location: [facility];  Service: ENT;  Laterality: N/A;  . RIGHT/LEFT HEART CATH AND CORONARY ANGIOGRAPHY N/A 09/10/2017   Procedure: RIGHT/LEFT HEART CATH AND CORONARY ANGIOGRAPHY;  Surgeon: Troy Sine, MD;  Location: [facility];  Service: Cardiovascular;  Laterality: N/A;  . THYROID SURGERY     Social History:  reports that she quit smoking about 14 years ago. She has never used smokeless tobacco. She reports that she does not drink  alcohol or use drugs. Patient lives at home  Allergies  Allergen Reactions  . Ampicillin Other (See Comments)    "Allergic," per MAR  . Hydrocodone Other (See Comments)    "Allergic," per MAR  . Aspirin Nausea Only  . Unasyn [Ampicillin-Sulbactam Sodium] Rash and Other (See Comments)    "Allergic," per [facility]    Family History  Problem Relation Age of Onset  . Diabetes Mother   . Hypertension Mother   . Cancer Mother        pancreas   . Hypertension Sister       Prior to Admission medications   Medication Sig Start Date End Date Taking? Authorizing Provider  aspirin EC 81 MG tablet Take 81 mg by mouth daily.   Yes [provider]  atorvastatin (LIPITOR) 80 MG tablet TAKE 1 TABLET(80 MG) BY MOUTH DAILY AT 6 PM Patient taking differently: Take 80 mg by mouth daily at 6 PM.  10/17/19  Yes Branch, Alphonse Guild, MD  carvedilol (COREG) 6.25 MG tablet Take 1 tablet (6.25 mg total) by mouth 2 (two) times daily with a meal. 11/03/19  Yes Branch, Alphonse Guild, MD  clopidogrel (PLAVIX) 75 MG tablet TAKE 1 TABLET(75 MG) BY MOUTH DAILY WITH BREAKFAST Patient taking differently: Take 75 mg by mouth daily.  09/02/19  Yes BranchAlphonse Guild, MD  doxepin (SINEQUAN) 10 MG capsule Take 10 mg by mouth at bedtime. 10/21/19  Yes [provider]  ENTRESTO 97-103 MG Take 1 tablet by mouth 2 (two) times daily. 11/11/19  Yes [provider]  furosemide (LASIX) 40 MG tablet Take 1 tablet (40 mg total) by mouth daily. 09/14/17  Yes Ghimire, Henreitta Leber, MD  JARDIANCE 10 MG TABS tablet Take 10 mg by mouth daily. 10/21/19  Yes [provider]  levothyroxine (SYNTHROID, LEVOTHROID) 125 MCG tablet Take 125 mcg by mouth daily before breakfast.  08/16/18  Yes [provider]  NOVOLOG FLEXPEN 100 UNIT/ML FlexPen Inject 15 Units into the skin 3 (three) times daily with meals.  09/02/19  Yes [provider]  spironolactone (ALDACTONE) 25 MG tablet Take 25 mg by mouth daily. 10/17/19  Yes [provider]  timolol (TIMOPTIC) 0.5 % ophthalmic solution Place 1 drop into both eyes 2 (two) times daily. 10/07/18  Yes [provider]  TRESIBA FLEXTOUCH 100 UNIT/ML SOPN FlexTouch Pen Inject 60 Units into the skin daily.  10/08/19  Yes [provider]  VENTOLIN HFA 108 (90 Base) MCG/ACT inhaler Inhale 1-2 puffs into the lungs every 6 (six) hours as needed for wheezing or shortness of breath. Patient  taking differently: Inhale 2 puffs into the lungs every 6 (six) hours as needed for wheezing or shortness of breath.  09/14/17  Yes Ghimire, Henreitta Leber, MD  cetirizine (ZYRTEC) 10 MG tablet Take 10 mg by mouth daily as needed for allergies.  09/12/18   [provider]  nystatin (NYSTATIN) powder Apply topically See admin instructions. Apply to groin area every shift for moisture-associated rash    [provider]  ondansetron (ZOFRAN ODT) 4 MG disintegrating tablet Take 1 tablet (4 mg total) by mouth every 8 (eight) hours as needed. Patient taking differently: Take 4 mg by mouth every 8 (eight) hours as needed for nausea or vomiting.  09/06/19   Fredia Sorrow, MD  traMADol (ULTRAM) 50 MG tablet Take 1 tablet (50 mg total) by mouth every 6 (six) hours as needed for severe pain. Patient taking differently: Take 50 mg by mouth  every 6 (six) hours as needed for moderate pain or severe pain.  10/02/19   Dessa Phi, DO    Physical Exam: BP (!) 122/49   Pulse 71   Temp 98.3 F (36.8 C) (Oral)   Resp 20   Ht 5\' 5"  (1.651 m)   Wt (!) 152 kg   SpO2 97%   BMI 55.75 kg/m   . General: Elderly female. Awake and alert and oriented x3. No acute cardiopulmonary distress.  Marland Kitchen HEENT: Normocephalic atraumatic.  Right and left ears normal in appearance.  Pupils equal, round, reactive to light. Extraocular muscles are intact. Sclerae anicteric and noninjected.  Moist mucosal membranes. No mucosal lesions.  . Neck: Neck supple without lymphadenopathy. No carotid bruits. No masses palpated.  . Cardiovascular: Regular rate with normal S1-S2 sounds. No murmurs, rubs, gallops auscultated. No JVD.  Marland Kitchen Respiratory: Mild diffuse Rales.  No accessory muscle use. . Abdomen: Soft, nontender, nondistended. Active bowel sounds. No masses or hepatosplenomegaly  . Skin: No rashes, lesions, or ulcerations.  Dry, warm to touch. 2+ dorsalis pedis and radial pulses. . Musculoskeletal: No calf or leg pain. All  major joints not erythematous nontender.  No upper or lower joint deformation.  Good ROM.  No contractures  . Psychiatric: Intact judgment and insight. Pleasant and cooperative. . Neurologic: No focal neurological deficits. Strength is 5/5 and symmetric in upper and lower extremities.  Cranial nerves II through XII are grossly intact.           Labs on Admission: I have personally reviewed following labs and imaging studies  CBC: Recent Labs  Lab 12/26/19 1543  WBC 5.2  NEUTROABS 2.9  HGB 10.9*  HCT 34.3*  MCV 91.5  PLT 643   Basic Metabolic Panel: Recent Labs  Lab 12/26/19 1500  NA 138  K 4.0  CL 106  CO2 22  GLUCOSE 180*  BUN 28*  CREATININE 1.75*  CALCIUM 8.0*   GFR: Estimated Creatinine Clearance: 50.6 mL/min (A) (by C-G formula based on SCr of 1.75 mg/dL (H)). Liver Function Tests: Recent Labs  Lab 12/26/19 1500  AST 33  ALT 38  ALKPHOS 320*  BILITOT 1.1  PROT 8.3*  ALBUMIN 3.4*   No results for input(s): LIPASE, AMYLASE in the last 168 hours. No results for input(s): AMMONIA in the last 168 hours. Coagulation Profile: No results for input(s): INR, PROTIME in the last 168 hours. Cardiac Enzymes: No results for input(s): CKTOTAL, CKMB, CKMBINDEX, TROPONINI in the last 168 hours. BNP (last 3 results) No results for input(s): PROBNP in the last 8760 hours. HbA1C: No results for input(s): HGBA1C in the last 72 hours. CBG: Recent Labs  Lab 12/26/19 1139  GLUCAP 145*   Lipid Profile: Recent Labs    12/26/19 1546  TRIG 124   Thyroid Function Tests: No results for input(s): TSH, T4TOTAL, FREET4, T3FREE, THYROIDAB in the last 72 hours. Anemia Panel: Recent Labs    12/26/19 1546  FERRITIN 188   Urine analysis:    Component Value Date/Time   COLORURINE AMBER (A) 12/26/2019 1709   APPEARANCEUR CLOUDY (A) 12/26/2019 1709   LABSPEC 1.024 12/26/2019 1709   PHURINE 5.0 12/26/2019 1709   GLUCOSEU NEGATIVE 12/26/2019 1709   HGBUR NEGATIVE  12/26/2019 1709   BILIRUBINUR NEGATIVE 12/26/2019 1709   KETONESUR 5 (A) 12/26/2019 1709   PROTEINUR 100 (A) 12/26/2019 1709   UROBILINOGEN 0.2 10/02/2015 2320   NITRITE NEGATIVE 12/26/2019 1709   LEUKOCYTESUR NEGATIVE 12/26/2019 1709   Sepsis Labs: @  LABRCNTIP(procalcitonin:4,lacticidven:4) ) Recent Results (from the past 240 hour(s))  Blood Culture (routine x 2)     Status: None (Preliminary result)   Collection Time: 12/26/19  3:46 PM   Specimen: BLOOD LEFT ARM  Result Value Ref Range Status   Specimen Description BLOOD LEFT ARM  Final   Special Requests   Final    BOTTLES DRAWN AEROBIC AND ANAEROBIC Blood Culture adequate volume Performed at [facility], [address] [place], [place] [address]    Culture PENDING  Incomplete   Report Status PENDING  Incomplete  Blood Culture (routine x 2)     Status: None (Preliminary result)   Collection Time: 12/26/19  5:13 PM   Specimen: Left Antecubital; Blood  Result Value Ref Range Status   Specimen Description LEFT ANTECUBITAL  Final   Special Requests   Final    BOTTLES DRAWN AEROBIC AND ANAEROBIC Blood Culture adequate volume Performed at [facility], [address], [place], [place] [address]    Culture PENDING  Incomplete   Report Status PENDING  Incomplete     Radiological Exams on Admission: DG Chest Port 1 View  Result Date: 12/26/2019 CLINICAL DATA:  Cough, weakness EXAM: PORTABLE CHEST 1 VIEW COMPARISON:  09/15/2019 FINDINGS: Moderate cardiomegaly, stable. Pulmonary vascular congestion. Diffuse interstitial and alveolar airspace opacities throughout both lungs. No large pleural fluid collection. No pneumothorax. IMPRESSION: Cardiomegaly and pulmonary vascular congestion with diffuse interstitial and alveolar airspace opacities. Findings are suggestive of CHF with pulmonary edema. A superimposed infectious process would be difficult to exclude. Electronically Signed   By: Davina Poke D.O.   On: 12/26/2019 14:58     EKG: Independently reviewed.  Rhythm with left bundle branch block.  No acute ST changes  Assessment/Plan: Principal Problem:   Acute respiratory disease due to COVID-19 virus Active Problems:   Hypothyroidism   Essential hypertension   GERD   Morbid obesity/BMI > 55   Diabetes mellitus type 2 with complications, uncontrolled ([place])   Ischemic cardiomyopathy   Chronic combined systolic and diastolic CHF (congestive heart failure) (EF 30 to 35 %)    This patient was discussed with the ED physician, including pertinent vitals, physical exam findings, labs, and imaging.  We also discussed care given by the ED provider.  1. Acute respiratory disease due to Covid Daily labs: CBC, CMP, CRP, D-dimer, ferritin, magnesium, phosphorus Continue steroids and remdesivir. Proning Supplemental oxygen Zinc and vitamin C Albuterol HFA as needed Antitussives 2. Type 2 diabetes a. Continue home insulin: Tresiba and NovoLog b. Will hold Jardiance c. Start Tradjenta as this been shown to improve outcomes in diabetic patients 3. Ischemic cardiomyopathy with chronic combined systolic and diastolic heart failure a. Appears compensated b. Continue spironolactone and Lasix 4. Morbid obesity a.  5. Hypertension a. Continue home regimen 6. GERD a. Continue PPI 7. Hypothyroidism a. Continue levothyroxine  DVT prophylaxis: Lovenox Consultants: None Code Status: Full code Family Communication: None Disposition Plan: Patient should be able to return home following admission   Truett Mainland, DO

## 2019-12-26 NOTE — ED Provider Notes (Signed)
[facility] EMERGENCY DEPARTMENT Provider Note   CSN: [id number] Arrival date & time: 12/26/19  1125     History Chief Complaint  Patient presents with  . Weakness    Maureen Jackson is a 64 y.o. female with a history of CAD, CHF last EF 35 to 40%, diabetes mellitus, hypertension, obesity, & OSA who presents to the ED with complaints of generally not feeling well for the past 5 days.  Patient states she has had nasal congestion, dry cough, and generalized weakness.  States she sometimes has a hard time breathing when she is up and moving around.  No other alleviating or aggravating factors.  No intervention prior to arrival.  She denies fever (unaware until ER arrival), chills, body aches, chest pain, abdominal pain, nausea, vomiting, syncope, or leg pain/swelling.   HPI     Past Medical History:  Diagnosis Date  . 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 Bleeding from mouth    when she brushed teeth or ate  . Chronic systolic CHF (congestive heart failure) ([place])   . Coronary artery disease 09/2017   a. multivessel CAD by cath in 09/2017 and not felt to be a CABG candidate --> underwent two-vessel PCI with DES to the LAD and DES to the RCA  . Diabetes mellitus   . Dyspnea   . Glaucoma   . Hypertension   . Left bundle branch block   . Morbid obesity ([place])   . Obstructive sleep apnea    does not wear CPAP  . Thyroid disease     Patient Active Problem List   Diagnosis Date Noted  . Pressure injury of skin 09/21/2019  . Acute renal failure (ARF) ([place])   . AMS (altered mental status)   . Drug rash   . Abscess   . Fistula   . Labial abscess   . Fournier's gangrene in female   . CAP (community acquired pneumonia) 09/08/2019  . Chest pain 07/16/2018  . Hyperlipidemia 04/10/2018  . TIA (transient ischemic attack) 03/14/2018  . Vertigo 03/14/2018  . Chronic combined systolic and diastolic CHF (congestive heart failure) (EF 30 to 35 %) 03/14/2018  . Ischemic cardiomyopathy  11/06/2017  . CAD, multiple vessel   . Pericardial effusion 09/07/2017  . AKI (acute kidney injury) ([place]) 09/07/2017  . Elevated troponin I level 09/07/2017  . Acute combined systolic (congestive) and diastolic (congestive) heart failure ([place]) 09/06/2017  . Cardiomegaly 09/05/2017  . Morbid obesity/BMI > 55 03/04/2013  . Labyrinthitis 03/04/2013  . Diabetes mellitus type 2 with complications, uncontrolled ([place]) 03/04/2013  . GASTRITIS 12/13/2006  . HIATAL HERNIA, HX OF 12/13/2006  . THYROIDECTOMY, HX OF 12/13/2006  . Hypothyroidism 10/04/2006  . TOBACCO ABUSE 10/04/2006  . CARPAL TUNNEL SYNDROME 10/04/2006  . Essential hypertension 10/04/2006  . GERD 10/04/2006  . POSTMENOPAUSAL STATUS 10/04/2006  . SKIN TAG 10/04/2006  . KNEE PAIN, LEFT 10/04/2006  . Sleep apnea 10/04/2006  . LEG EDEMA 10/04/2006    Past Surgical History:  Procedure Laterality Date  . ABDOMINAL HYSTERECTOMY    . CARDIAC CATHETERIZATION  09/12/2017  . CORONARY STENT INTERVENTION  09/12/2017   STENT RESOLUTE ONYX 2.42A83 drug eluting stent was successfully placed  . CORONARY STENT INTERVENTION N/A 09/12/2017   Procedure: CORONARY STENT INTERVENTION;  Surgeon: Leonie Man, MD;  Location: [facility];  Service: Cardiovascular;  Laterality: N/A;  . INCISION AND DRAINAGE PERIRECTAL ABSCESS Left 09/16/2019   Procedure: IRRIGATION AND DEBRIDEMENT LABIA ABSCESS;  Surgeon: Coralie Keens, MD;  Location: [place];  Service: General;  Laterality: Left;  . IR FLUORO GUIDE CV LINE RIGHT  09/16/2019  . IR US GUIDE VASC ACCESS RIGHT  09/16/2019  . LEFT HEART CATH AND CORONARY ANGIOGRAPHY N/A 09/12/2017   Procedure: LEFT HEART CATH AND CORONARY ANGIOGRAPHY;  Surgeon: Leonie Man, MD;  Location: [place] [facility];  Service: Cardiovascular;  Laterality: N/A;  . MASS EXCISION N/A 12/18/2018   Procedure: EXCISION TONGUE MASS;  Surgeon: Leta Baptist, MD;  Location: [place] [facility];  Service: ENT;  Laterality: N/A;  . RIGHT/LEFT  HEART CATH AND CORONARY ANGIOGRAPHY N/A 09/10/2017   Procedure: RIGHT/LEFT HEART CATH AND CORONARY ANGIOGRAPHY;  Surgeon: Troy Sine, MD;  Location: [place] [facility];  Service: Cardiovascular;  Laterality: N/A;  . THYROID SURGERY       OB History   No obstetric history on file.     Family History  Problem Relation Age of Onset  . Diabetes Mother   . Hypertension Mother   . Cancer Mother        pancreas  . Hypertension Sister     Social History   Tobacco Use  . Smoking status: Former Smoker    Quit date: 12/14/2005    Years since quitting: 14.0  . Smokeless tobacco: Never Used  . Tobacco comment: QUIT IN 2005  Substance Use Topics  . Alcohol use: No  . Drug use: No    Home Medications Prior to Admission medications   Medication Sig Start Date End Date Taking? Authorizing Provider  aspirin EC 81 MG tablet Take 81 mg by mouth daily.    [provider]  atorvastatin (LIPITOR) 80 MG tablet TAKE 1 TABLET(80 MG) BY MOUTH DAILY AT 6 PM 10/17/19   Branch, Alphonse Guild, MD  carvedilol (COREG) 6.25 MG tablet Take 1 tablet (6.25 mg total) by mouth 2 (two) times daily with a meal. 11/03/19   Branch, Alphonse Guild, MD  cetirizine (ZYRTEC) 10 MG tablet Take 10 mg by mouth daily as needed for allergies.  09/12/18   [provider]  clopidogrel (PLAVIX) 75 MG tablet TAKE 1 TABLET(75 MG) BY MOUTH DAILY WITH BREAKFAST Patient taking differently: Take 75 mg by mouth daily.  09/02/19   Arnoldo Lenis, MD  doxepin (SINEQUAN) 10 MG capsule Take 10 mg by mouth at bedtime. 10/21/19   [provider]  ENTRESTO 97-103 MG Take 1 tablet by mouth 2 (two) times daily. 11/11/19   [provider]  furosemide (LASIX) 40 MG tablet Take 1 tablet (40 mg total) by mouth daily. 09/14/17   Ghimire, Henreitta Leber, MD  JARDIANCE 10 MG TABS tablet Take 10 mg by mouth daily. 10/21/19   [provider]  levothyroxine (SYNTHROID, LEVOTHROID) 125 MCG tablet Take 125 mcg by mouth  daily before breakfast.  08/16/18   [provider]  NOVOLOG FLEXPEN 100 UNIT/ML FlexPen Inject 15 Units into the skin 3 (three) times daily with meals.  09/02/19   [provider]  nystatin (NYSTATIN) powder Apply topically See admin instructions. Apply to groin area every shift for moisture-associated rash    [provider]  ondansetron (ZOFRAN ODT) 4 MG disintegrating tablet Take 1 tablet (4 mg total) by mouth every 8 (eight) hours as needed. Patient taking differently: Take 4 mg by mouth every 8 (eight) hours as needed for nausea or vomiting.  09/06/19   Fredia Sorrow, MD  spironolactone (ALDACTONE) 25 MG tablet Take 25 mg by  mouth daily. 10/17/19   [provider]  timolol (TIMOPTIC) 0.5 % ophthalmic solution Place 1 drop into both eyes 2 (two) times daily. 10/07/18   [provider]  traMADol (ULTRAM) 50 MG tablet Take 1 tablet (50 mg total) by mouth every 6 (six) hours as needed for severe pain. Patient taking differently: Take 50 mg by mouth every 6 (six) hours as needed for moderate pain or severe pain.  10/02/19   Dessa Phi, DO  TRESIBA FLEXTOUCH 100 UNIT/ML SOPN FlexTouch Pen SMARTSIG:60 Unit(s) SUB-Q Daily 10/08/19   [provider]  VENTOLIN HFA 108 (90 Base) MCG/ACT inhaler Inhale 1-2 puffs into the lungs every 6 (six) hours as needed for wheezing or shortness of breath. Patient taking differently: Inhale 2 puffs into the lungs every 6 (six) hours as needed for wheezing or shortness of breath.  09/14/17   Ghimire, Henreitta Leber, MD    Allergies    Ampicillin, Hydrocodone, Aspirin, and Unasyn [ampicillin-sulbactam sodium]  Review of Systems   Review of Systems  Constitutional: Positive for fatigue. Negative for chills and fever.  HENT: Negative for congestion, ear pain and sore throat.   Respiratory: Positive for cough and shortness of breath.   Cardiovascular: Negative for chest pain.  Gastrointestinal: Negative for abdominal  pain, diarrhea, nausea and vomiting.  Genitourinary: Negative for dysuria.  Musculoskeletal: Negative for myalgias.  Neurological: Positive for weakness (generalized).  All other systems reviewed and are negative.   Physical Exam Updated Vital Signs BP (!) 134/56 (BP Location: Right Arm)   Pulse 81   Temp (!) 101.2 F (38.4 C) (Oral)   Resp 16   Ht 5\' 5"  (1.651 m)   Wt (!) 152 kg   SpO2 92%   BMI 55.75 kg/m   Physical Exam Vitals and nursing note reviewed.  Constitutional:      General: She is not in acute distress.    Appearance: She is well-developed.  HENT:     Head: Normocephalic and atraumatic.     Right Ear: Ear canal normal. Tympanic membrane is not perforated, erythematous, retracted or bulging.     Left Ear: Ear canal normal. Tympanic membrane is not perforated, erythematous, retracted or bulging.     Ears:     Comments: No mastoid erythema/swelling/tenderness.     Nose:     Right Sinus: No maxillary sinus tenderness or frontal sinus tenderness.     Left Sinus: No maxillary sinus tenderness or frontal sinus tenderness.     Mouth/Throat:     Pharynx: Uvula midline. No oropharyngeal exudate or posterior oropharyngeal erythema.     Comments: Posterior oropharynx is symmetric appearing. Patient tolerating own secretions without difficulty. No trismus. No drooling. No hot potato voice. No swelling beneath the tongue, submandibular compartment is soft.  Eyes:     General:        Right eye: No discharge.        Left eye: No discharge.     Conjunctiva/sclera: Conjunctivae normal.     Pupils: Pupils are equal, round, and reactive to light.  Cardiovascular:     Rate and Rhythm: Normal rate and regular rhythm.     Heart sounds: No murmur.  Pulmonary:     Effort: No respiratory distress.     Breath sounds: Decreased breath sounds (bibasilar) present. No wheezing, rhonchi or rales.     Comments: SpO2 92% on RA @ rest- desaturated to 88% on RA with ambulation throughout  exam room.  Abdominal:  General: There is no distension.     Palpations: Abdomen is soft.     Tenderness: There is no abdominal tenderness.  Musculoskeletal:     Cervical back: Normal range of motion and neck supple. No edema or rigidity.     Comments: No significant LE edema.   Lymphadenopathy:     Cervical: No cervical adenopathy.  Skin:    General: Skin is warm and dry.     Findings: No rash.  Neurological:     Mental Status: She is alert.  Psychiatric:        Behavior: Behavior normal.     ED Results / Procedures / Treatments   Labs (all labs ordered are listed, but only abnormal results are displayed) Labs Reviewed  COMPREHENSIVE METABOLIC PANEL - Abnormal; Notable for the following components:      Result Value   Glucose, Bld 180 (*)    BUN 28 (*)    Creatinine, Ser 1.75 (*)    Calcium 8.0 (*)    Total Protein 8.3 (*)    Albumin 3.4 (*)    Alkaline Phosphatase 320 (*)    GFR calc non Af Amer 31 (*)    GFR calc Af Amer 36 (*)    All other components within normal limits  CBC WITH DIFFERENTIAL/PLATELET - Abnormal; Notable for the following components:   RBC 3.75 (*)    Hemoglobin 10.9 (*)    HCT 34.3 (*)    All other components within normal limits  D-DIMER, QUANTITATIVE (NOT AT [facility]) - Abnormal; Notable for the following components:   D-Dimer, Quant 0.67 (*)    All other components within normal limits  LACTATE DEHYDROGENASE - Abnormal; Notable for the following components:   LDH 255 (*)    All other components within normal limits  FIBRINOGEN - Abnormal; Notable for the following components:   Fibrinogen 653 (*)    All other components within normal limits  C-REACTIVE PROTEIN - Abnormal; Notable for the following components:   CRP 10.5 (*)    All other components within normal limits  CBG MONITORING, ED - Abnormal; Notable for the following components:   Glucose-Capillary 145 (*)    All other components within normal limits  POC SARS CORONAVIRUS 2 AG -   ED - Abnormal; Notable for the following components:   SARS Coronavirus 2 Ag POSITIVE (*)    All other components within normal limits  CULTURE, BLOOD (ROUTINE X 2)  CULTURE, BLOOD (ROUTINE X 2)  BRAIN NATRIURETIC PEPTIDE  LACTIC ACID, PLASMA  PROCALCITONIN  FERRITIN  TRIGLYCERIDES  URINALYSIS, ROUTINE W REFLEX MICROSCOPIC  CBC WITH DIFFERENTIAL/PLATELET  LACTIC ACID, PLASMA    EKG EKG Interpretation  Date/Time:  Friday December 26 2019 11:41:44 EST Ventricular Rate:  81 PR Interval:  138 QRS Duration: 152 QT Interval:  426 QTC Calculation: 494 R Axis:   21 Text Interpretation: Normal sinus rhythm Left bundle branch block Abnormal ECG Confirmed by Veryl Speak [phone]) on 12/26/2019 11:47:09 AM   Radiology DG Chest Port 1 View  Result Date: 12/26/2019 CLINICAL DATA:  Cough, weakness EXAM: PORTABLE CHEST 1 VIEW COMPARISON:  09/15/2019 FINDINGS: Moderate cardiomegaly, stable. Pulmonary vascular congestion. Diffuse interstitial and alveolar airspace opacities throughout both lungs. No large pleural fluid collection. No pneumothorax. IMPRESSION: Cardiomegaly and pulmonary vascular congestion with diffuse interstitial and alveolar airspace opacities. Findings are suggestive of CHF with pulmonary edema. A superimposed infectious process would be difficult to exclude. Electronically Signed   By: Davina Poke D.O.  On: 12/26/2019 14:58    Procedures Procedures (including critical care time)  Medications Ordered in ED Medications  dexamethasone (DECADRON) injection 6 mg (6 mg Intravenous Given 12/26/19 1600)  albuterol (VENTOLIN HFA) 108 (90 Base) MCG/ACT inhaler 2 puff (2 puffs Inhalation Given 12/26/19 1647)  AeroChamber Plus Flo-Vu Medium MISC 1 each (1 each Other Given 12/26/19 1647)  acetaminophen (TYLENOL) tablet 650 mg (650 mg Oral Given 12/26/19 1712)    ED Course  I have reviewed the triage vital signs and the nursing notes.  Pertinent labs & imaging results that were  available during my care of the patient were reviewed by me and considered in my medical decision making (see chart for details).    VITORIA CONYER was evaluated in Emergency Department on 12/26/2019 for the symptoms described in the history of present illness. He/she was evaluated in the context of the global COVID-19 pandemic, which necessitated consideration that the patient might be at risk for infection with the SARS-CoV-2 virus that causes COVID-19. Institutional protocols and algorithms that pertain to the evaluation of patients at risk for COVID-19 are in a state of rapid change based on information released by regulatory bodies including the CDC and federal and state organizations. These policies and algorithms were followed during the patient's care in the ED.  MDM Rules/Calculators/A&P                      Patient presents to the ED with complaints of not feeling well for the past 5 days.  Patient is febrile, hypoxic with ambulation returns to 92% on RA @ rest, vitals otherwise without significant abnormality. Decreased breath sounds @ the bases.   Rapid COVID Positive CBC: No leukocytosis, leukopenia, or platelet dysfunction. Mild anemia improved from prior CMP: Renal function improved from prior. Mild hypocalcemia, no significant electrolyte derangement. Mild hypoalbuminemia. Hyperglycemia without acidosis or anion gap elevation.  Elevated inflammatory markers include fibrinogen, LDH, d-dimer, & CRP.  EKG: LBBB- present previously  CXR: Cardiomegaly and pulmonary vascular congestion with diffuse interstitial and alveolar airspace opacities. Findings are suggestive of CHF with pulmonary edema. A superimposed infectious process would be difficult to exclude. --> BNP WNL, no significant peripheral edema, infectious process seems more likely.   Patient positive for COVID 19, hypoxic with ambulation, decadron/albuterol given in the ED, will consult hospitalist service for admission.    17:58: CONSULT: Discussed with hospitalist Dr. Nehemiah Settle, will placed on 2L to maintain SpO2 greater than or equal to 94%, accepts admission.   Final Clinical Impression(s) / ED Diagnoses Final diagnoses:  [id number]    Rx / DC Orders ED Discharge Orders    None       Amaryllis Dyke, PA-C 12/26/19 1850    Milton Ferguson, MD 12/28/19 [phone]

## 2019-12-27 LAB — CBC WITH DIFFERENTIAL/PLATELET
Abs Immature Granulocytes: 0.01 10*3/uL (ref 0.00–0.07)
Basophils Absolute: 0 10*3/uL (ref 0.0–0.1)
Basophils Relative: 0 %
Eosinophils Absolute: 0 10*3/uL (ref 0.0–0.5)
Eosinophils Relative: 0 %
HCT: 34.7 % — ABNORMAL LOW (ref 36.0–46.0)
Hemoglobin: 11.1 g/dL — ABNORMAL LOW (ref 12.0–15.0)
Immature Granulocytes: 0 %
Lymphocytes Relative: 39 %
Lymphs Abs: 1.3 10*3/uL (ref 0.7–4.0)
MCH: 28.7 pg (ref 26.0–34.0)
MCHC: 32 g/dL (ref 30.0–36.0)
MCV: 89.7 fL (ref 80.0–100.0)
Monocytes Absolute: 0.1 10*3/uL (ref 0.1–1.0)
Monocytes Relative: 2 %
Neutro Abs: 2 10*3/uL (ref 1.7–7.7)
Neutrophils Relative %: 59 %
Platelets: 157 10*3/uL (ref 150–400)
RBC: 3.87 MIL/uL (ref 3.87–5.11)
RDW: 13 % (ref 11.5–15.5)
WBC: 3.4 10*3/uL — ABNORMAL LOW (ref 4.0–10.5)
nRBC: 0 % (ref 0.0–0.2)

## 2019-12-27 LAB — GLUCOSE, CAPILLARY
Glucose-Capillary: 213 mg/dL — ABNORMAL HIGH (ref 70–99)
Glucose-Capillary: 220 mg/dL — ABNORMAL HIGH (ref 70–99)
Glucose-Capillary: 231 mg/dL — ABNORMAL HIGH (ref 70–99)
Glucose-Capillary: 258 mg/dL — ABNORMAL HIGH (ref 70–99)
Glucose-Capillary: 260 mg/dL — ABNORMAL HIGH (ref 70–99)
Glucose-Capillary: 279 mg/dL — ABNORMAL HIGH (ref 70–99)

## 2019-12-27 LAB — HIV ANTIBODY (ROUTINE TESTING W REFLEX): HIV Screen 4th Generation wRfx: NONREACTIVE

## 2019-12-27 LAB — ABO/RH: ABO/RH(D): A POS

## 2019-12-27 LAB — C-REACTIVE PROTEIN: CRP: 10.1 mg/dL — ABNORMAL HIGH (ref <1.0)

## 2019-12-27 LAB — MAGNESIUM: Magnesium: 2 mg/dL (ref 1.7–2.4)

## 2019-12-27 LAB — D-DIMER, QUANTITATIVE: D-Dimer, Quant: 0.52 ug/mL-FEU — ABNORMAL HIGH (ref 0.00–0.50)

## 2019-12-27 LAB — PHOSPHORUS: Phosphorus: 4.8 mg/dL — ABNORMAL HIGH (ref 2.5–4.6)

## 2019-12-27 MED ORDER — NYSTATIN 100000 UNIT/GM EX POWD
Freq: Three times a day (TID) | CUTANEOUS | Status: DC
  Filled 2019-12-27: qty 15

## 2019-12-27 MED ORDER — TRAMADOL HCL 50 MG PO TABS
50.0000 mg | ORAL_TABLET | Freq: Four times a day (QID) | ORAL | Status: DC | PRN
  Administered 2019-12-27 – 2019-12-30 (×7): 50 mg via ORAL
  Filled 2019-12-27 (×7): qty 1

## 2019-12-27 MED ORDER — INSULIN GLARGINE 100 UNIT/ML ~~LOC~~ SOLN
60.0000 [IU] | Freq: Every day | SUBCUTANEOUS | Status: DC
  Administered 2019-12-27: 08:00:00 60 [IU] via SUBCUTANEOUS
  Filled 2019-12-27 (×2): qty 0.6

## 2019-12-27 MED ORDER — CARVEDILOL 3.125 MG PO TABS
6.2500 mg | ORAL_TABLET | Freq: Two times a day (BID) | ORAL | Status: DC
  Administered 2019-12-27 (×2): 6.25 mg via ORAL
  Filled 2019-12-27 (×2): qty 2

## 2019-12-27 MED ORDER — ALBUTEROL SULFATE HFA 108 (90 BASE) MCG/ACT IN AERS
2.0000 | INHALATION_SPRAY | Freq: Four times a day (QID) | RESPIRATORY_TRACT | Status: DC | PRN
  Filled 2019-12-27: qty 6.7

## 2019-12-27 MED ORDER — ATORVASTATIN CALCIUM 40 MG PO TABS
80.0000 mg | ORAL_TABLET | Freq: Every day | ORAL | Status: DC
  Administered 2019-12-27 – 2019-12-30 (×4): 80 mg via ORAL
  Filled 2019-12-27 (×4): qty 2

## 2019-12-27 MED ORDER — INSULIN DEGLUDEC 100 UNIT/ML ~~LOC~~ SOPN: 60.0000 [IU] | PEN_INJECTOR | Freq: Every day | SUBCUTANEOUS | Status: DC

## 2019-12-27 MED ORDER — ENOXAPARIN SODIUM 40 MG/0.4ML ~~LOC~~ SOLN: 40.0000 mg | SUBCUTANEOUS | Status: DC

## 2019-12-27 MED ORDER — ONDANSETRON 4 MG PO TBDP
4.0000 mg | ORAL_TABLET | Freq: Three times a day (TID) | ORAL | Status: DC | PRN
  Administered 2019-12-30: 4 mg via ORAL
  Filled 2019-12-27: qty 1

## 2019-12-27 MED ORDER — FUROSEMIDE 10 MG/ML IJ SOLN
40.0000 mg | Freq: Two times a day (BID) | INTRAMUSCULAR | Status: DC
  Administered 2019-12-27 – 2019-12-28 (×2): 40 mg via INTRAVENOUS
  Filled 2019-12-27 (×2): qty 4

## 2019-12-27 MED ORDER — DEXAMETHASONE 6 MG PO TABS
6.0000 mg | ORAL_TABLET | ORAL | Status: DC
  Administered 2019-12-27 – 2019-12-30 (×4): 6 mg via ORAL
  Filled 2019-12-27 (×4): qty 1

## 2019-12-27 MED ORDER — ZINC SULFATE 220 (50 ZN) MG PO CAPS
220.0000 mg | ORAL_CAPSULE | Freq: Every day | ORAL | Status: DC
  Administered 2019-12-27 – 2019-12-31 (×5): 220 mg via ORAL
  Filled 2019-12-27 (×5): qty 1

## 2019-12-27 MED ORDER — FUROSEMIDE 20 MG PO TABS
40.0000 mg | ORAL_TABLET | Freq: Every day | ORAL | Status: DC
  Administered 2019-12-27: 08:00:00 40 mg via ORAL
  Filled 2019-12-27: qty 2

## 2019-12-27 MED ORDER — ASPIRIN EC 81 MG PO TBEC
81.0000 mg | DELAYED_RELEASE_TABLET | Freq: Every day | ORAL | Status: DC
  Administered 2019-12-27 – 2019-12-31 (×5): 81 mg via ORAL
  Filled 2019-12-27 (×5): qty 1

## 2019-12-27 MED ORDER — TIMOLOL MALEATE 0.5 % OP SOLN
1.0000 [drp] | Freq: Two times a day (BID) | OPHTHALMIC | Status: DC
  Administered 2019-12-27 – 2019-12-31 (×9): 1 [drp] via OPHTHALMIC
  Filled 2019-12-27: qty 5

## 2019-12-27 MED ORDER — ASCORBIC ACID 500 MG PO TABS
500.0000 mg | ORAL_TABLET | Freq: Every day | ORAL | Status: DC
  Administered 2019-12-27 – 2019-12-31 (×5): 500 mg via ORAL
  Filled 2019-12-27 (×5): qty 1

## 2019-12-27 MED ORDER — LEVOTHYROXINE SODIUM 75 MCG PO TABS: 125.0000 ug | ORAL_TABLET | Freq: Every day | ORAL | Status: DC

## 2019-12-27 MED ORDER — LEVOTHYROXINE SODIUM 75 MCG PO TABS
125.0000 ug | ORAL_TABLET | Freq: Every day | ORAL | Status: DC
  Administered 2019-12-27 – 2019-12-31 (×5): 125 ug via ORAL
  Filled 2019-12-27 (×5): qty 1

## 2019-12-27 MED ORDER — LINAGLIPTIN 5 MG PO TABS
5.0000 mg | ORAL_TABLET | Freq: Every day | ORAL | Status: DC
  Administered 2019-12-27 – 2019-12-28 (×2): 5 mg via ORAL
  Filled 2019-12-27 (×2): qty 1

## 2019-12-27 MED ORDER — SACUBITRIL-VALSARTAN 97-103 MG PO TABS
1.0000 | ORAL_TABLET | Freq: Two times a day (BID) | ORAL | Status: DC
  Administered 2019-12-27 – 2019-12-28 (×4): 1 via ORAL
  Filled 2019-12-27 (×5): qty 1

## 2019-12-27 MED ORDER — INSULIN ASPART 100 UNIT/ML ~~LOC~~ SOLN
0.0000 [IU] | SUBCUTANEOUS | Status: DC
  Administered 2019-12-27 (×2): 8 [IU] via SUBCUTANEOUS
  Administered 2019-12-27: 21:00:00 5 [IU] via SUBCUTANEOUS
  Administered 2019-12-27: 04:00:00 8 [IU] via SUBCUTANEOUS
  Administered 2019-12-27: 12:00:00 5 [IU] via SUBCUTANEOUS
  Administered 2019-12-28: 11 [IU] via SUBCUTANEOUS
  Administered 2019-12-28: 8 [IU] via SUBCUTANEOUS
  Administered 2019-12-28 (×2): 5 [IU] via SUBCUTANEOUS
  Administered 2019-12-28: 17:00:00 11 [IU] via SUBCUTANEOUS
  Administered 2019-12-28: 05:00:00 3 [IU] via SUBCUTANEOUS
  Administered 2019-12-28: 23:00:00 15 [IU] via SUBCUTANEOUS
  Administered 2019-12-29: 05:00:00 5 [IU] via SUBCUTANEOUS

## 2019-12-27 MED ORDER — INSULIN ASPART 100 UNIT/ML ~~LOC~~ SOLN
15.0000 [IU] | Freq: Three times a day (TID) | SUBCUTANEOUS | Status: DC
  Administered 2019-12-27 – 2019-12-31 (×13): 15 [IU] via SUBCUTANEOUS

## 2019-12-27 MED ORDER — CLOPIDOGREL BISULFATE 75 MG PO TABS
75.0000 mg | ORAL_TABLET | Freq: Every day | ORAL | Status: DC
  Administered 2019-12-27 – 2019-12-31 (×5): 75 mg via ORAL
  Filled 2019-12-27 (×5): qty 1

## 2019-12-27 MED ORDER — ENOXAPARIN SODIUM 80 MG/0.8ML ~~LOC~~ SOLN
75.0000 mg | SUBCUTANEOUS | Status: DC
  Administered 2019-12-28 – 2019-12-31 (×4): 75 mg via SUBCUTANEOUS
  Filled 2019-12-27 (×4): qty 0.8

## 2019-12-27 MED ORDER — PANTOPRAZOLE SODIUM 40 MG PO TBEC
40.0000 mg | DELAYED_RELEASE_TABLET | Freq: Every day | ORAL | Status: DC
  Administered 2019-12-27 – 2019-12-31 (×5): 40 mg via ORAL
  Filled 2019-12-27 (×5): qty 1

## 2019-12-27 MED ORDER — SPIRONOLACTONE 25 MG PO TABS
25.0000 mg | ORAL_TABLET | Freq: Every day | ORAL | Status: DC
  Administered 2019-12-27 – 2019-12-28 (×2): 25 mg via ORAL
  Filled 2019-12-27 (×3): qty 1

## 2019-12-27 MED ORDER — DOXEPIN HCL 10 MG PO CAPS
10.0000 mg | ORAL_CAPSULE | Freq: Every day | ORAL | Status: DC
  Administered 2019-12-27 – 2019-12-30 (×5): 10 mg via ORAL
  Filled 2019-12-27 (×7): qty 1

## 2019-12-27 NOTE — Progress Notes (Signed)
PROGRESS NOTE                                                                                                                                                                                                             Patient Demographics:    Maureen Jackson, is a 64 y.o. female, DOB - 1956/06/26, GHW:[id number]  Admit date - 12/26/2019   Admitting Physician No admitting provider for patient encounter.  Outpatient Primary MD for the patient is Neale Burly, MD  LOS - 1   Chief Complaint  Patient presents with  . Weakness       Brief Narrative    64 y.o. female with a history of COPD/asthma, coronary artery disease with stents, chronic systolic heart failure with ischemic cardiomyopathy and EF of approximately 35%.  Hypertension, thyroid disease, type 2 diabetes.  Patient seen for worsening shortness of breath over the past 2 to 3 days.  Work-up significant for Covid positive.  Possible pulmonary vascular congestion with possible superimposed infectious process.  Patient febrile.  CRP elevated to 10.5.  Lactic acid normal.  Procalcitonin 0.15.  LDH 255 normal BNP.    Subjective:    Maureen Jackson today reports some dyspnea, mild cough, nonproductive, denies any fever or chest pain .   Assessment  & Plan :    Principal Problem:   Acute respiratory disease due to COVID-19 virus Active Problems:   Hypothyroidism   Essential hypertension   GERD   Morbid obesity/BMI > 55   Diabetes mellitus type 2 with complications, uncontrolled ([place])   Ischemic cardiomyopathy   Chronic combined systolic and diastolic CHF (congestive heart failure) (EF 30 to 35 %)  Acute hypoxic respiratory failure due to COVID-19 pneumonia -Chest x-ray significant for multiple opacities, she is on 2 L nasal cannula. -Continue with  steroids. -Continue with IV remdesivir -He was encouraged to use incentive spirometry, flutter valve, out of bed to chair and modified  proning. -Continue to trend inflammatory markers closely especially with CRP more than 10.  COVID-19 Labs  Recent Labs    12/26/19 1546 12/27/19 0212  DDIMER 0.67* 0.52*  FERRITIN 188  --   LDH 255*  --   CRP 10.5* 10.1*    Lab Results  Component Value Date   SARSCOV2NAA NEGATIVE 09/29/2019   SARSCOV2NAA NEGATIVE 09/25/2019   SARSCOV2NAA NEGATIVE  09/08/2019   Acute on chronic systolic/diastolic CHF. -Chest x-ray significant for vascular congestion/questionable pulmonary edema, will check BNP. -Continue with Coreg, Entresto and Aldactone . -Increase her Lasix to 40 mg IV twice daily.  Type 2 diabetes mellitus -Started on insulin sliding scale, hold Jardiance, started on Tradjenta - she Is on Tarceva at home, will change to formulary Lantus. -Continue with insulin sliding scale  Ischemic cardiomyopathy -Continue with Plavix  Hypertension -Continue with home medication  GERD -Continue with PPI  Hypothyroidism -Continue with Synthroid.   Code Status : Full  Disposition Plan  : Home  Barriers For Discharge : Hypoxic, on IV remdesivir  Consults  :  None  Procedures  : None  DVT Prophylaxis  :  [place] lovenox   Lab Results  Component Value Date   PLT 157 12/27/2019    Antibiotics  :   Anti-infectives (From admission, onward)   Start     Dose/Rate Route Frequency Ordered Stop   12/27/19 1000  remdesivir 100 mg in sodium chloride 0.9 % 100 mL IVPB     100 mg 200 mL/hr over 30 Minutes Intravenous Daily 12/26/19 2023 12/31/19 0959   12/26/19 2100  remdesivir 200 mg in sodium chloride 0.9% 250 mL IVPB     200 mg 580 mL/hr over 30 Minutes Intravenous Once 12/26/19 2023 12/26/19 2315        Objective:   Vitals:   12/26/19 2200 12/27/19 0000 12/27/19 0411 12/27/19 0702  BP: 116/68 (!) 147/73 (!) 157/71 133/69  Pulse: 64 72 66 61  Resp: 17 12  18   Temp:  98.4 F (36.9 C) 97.9 F (36.6 C) 97.7 F (36.5 C)  TempSrc:   Oral Oral  SpO2: 96% 97% 95% 94%   Weight:      Height:        Wt Readings from Last 3 Encounters:  12/26/19 (!) 152 kg  10/02/19 (!) 156.2 kg  09/06/19 (!) 152 kg     Intake/Output Summary (Last 24 hours) at 12/27/2019 1443 Last data filed at 12/27/2019 1407 Gross per 24 hour  Intake 360 ml  Output --  Net 360 ml     Physical Exam  Awake Alert, Oriented X 3, No new F.N deficits, Normal affect Symmetrical Chest wall movement, Good air movement bilaterally, CTAB RRR,No Gallops,Rubs or new Murmurs, No Parasternal Heave +ve B.Sounds, Abd Soft, No tenderness, No rebound - guarding or rigidity. No Cyanosis, Clubbing , trace edema, No new Rash or bruise      Data Review:    CBC Recent Labs  Lab 12/26/19 1543 12/27/19 0212  WBC 5.2 3.4*  HGB 10.9* 11.1*  HCT 34.3* 34.7*  PLT 159 157  MCV 91.5 89.7  MCH 29.1 28.7  MCHC 31.8 32.0  RDW 12.9 13.0  LYMPHSABS 1.8 1.3  MONOABS 0.4 0.1  EOSABS 0.0 0.0  BASOSABS 0.0 0.0    Chemistries  Recent Labs  Lab 12/26/19 1500 12/27/19 0212  NA 138  --   K 4.0  --   CL 106  --   CO2 22  --   GLUCOSE 180*  --   BUN 28*  --   CREATININE 1.75*  --   CALCIUM 8.0*  --   MG  --  2.0  AST 33  --   ALT 38  --   ALKPHOS 320*  --   BILITOT 1.1  --    ------------------------------------------------------------------------------------------------------------------ Recent Labs    12/26/19 1546  TRIG 124  Lab Results  Component Value Date   HGBA1C 10.2 (H) 09/08/2019   ------------------------------------------------------------------------------------------------------------------ No results for input(s): TSH, T4TOTAL, T3FREE, THYROIDAB in the last 72 hours.  Invalid input(s): FREET3 ------------------------------------------------------------------------------------------------------------------ Recent Labs    12/26/19 1546  FERRITIN 188    Coagulation profile No results for input(s): INR, PROTIME in the last 168 hours.  Recent Labs     12/26/19 1546 12/27/19 0212  DDIMER 0.67* 0.52*    Cardiac Enzymes No results for input(s): CKMB, TROPONINI, MYOGLOBIN in the last 168 hours.  Invalid input(s): CK ------------------------------------------------------------------------------------------------------------------    Component Value Date/Time   BNP 50.0 12/26/2019 1543    Inpatient Medications  Scheduled Meds: . vitamin C  500 mg Oral Daily  . aspirin EC  81 mg Oral Daily  . atorvastatin  80 mg Oral q1800  . carvedilol  6.25 mg Oral BID WC  . clopidogrel  75 mg Oral Daily  . dexamethasone  6 mg Oral Q24H  . doxepin  10 mg Oral QHS  . [START ON 12/28/2019] enoxaparin (LOVENOX) injection  75 mg Subcutaneous Q24H  . furosemide  40 mg Oral Daily  . insulin aspart  0-15 Units Subcutaneous Q4H  . insulin aspart  15 Units Subcutaneous TID WC  . insulin glargine  60 Units Subcutaneous Daily  . levothyroxine  125 mcg Oral Q0600  . linagliptin  5 mg Oral Daily  . nystatin   Topical Q8H  . pantoprazole  40 mg Oral Daily  . sacubitril-valsartan  1 tablet Oral BID  . spironolactone  25 mg Oral Daily  . timolol  1 drop Both Eyes BID  . zinc sulfate  220 mg Oral Daily   Continuous Infusions: . remdesivir 100 mg in NS 100 mL 100 mg (12/27/19 0809)   PRN Meds:.albuterol, ondansetron, traMADol  Micro Results Recent Results (from the past 240 hour(s))  Blood Culture (routine x 2)     Status: None (Preliminary result)   Collection Time: 12/26/19  3:46 PM   Specimen: BLOOD LEFT ARM  Result Value Ref Range Status   Specimen Description BLOOD LEFT ARM  Final   Special Requests   Final    BOTTLES DRAWN AEROBIC AND ANAEROBIC Blood Culture adequate volume   Culture   Final    NO GROWTH < 24 HOURS Performed at [facility], [address] [place], [place] [address]    Report Status PENDING  Incomplete  Blood Culture (routine x 2)     Status: None (Preliminary result)   Collection Time: 12/26/19  5:13 PM   Specimen: Left  Antecubital; Blood  Result Value Ref Range Status   Specimen Description LEFT ANTECUBITAL  Final   Special Requests   Final    BOTTLES DRAWN AEROBIC AND ANAEROBIC Blood Culture adequate volume   Culture   Final    NO GROWTH < 12 HOURS Performed at [facility], [address] [place], [place] [address]    Report Status PENDING  Incomplete    Radiology Reports DG Chest Port 1 View  Result Date: 12/26/2019 CLINICAL DATA:  Cough, weakness EXAM: PORTABLE CHEST 1 VIEW COMPARISON:  09/15/2019 FINDINGS: Moderate cardiomegaly, stable. Pulmonary vascular congestion. Diffuse interstitial and alveolar airspace opacities throughout both lungs. No large pleural fluid collection. No pneumothorax. IMPRESSION: Cardiomegaly and pulmonary vascular congestion with diffuse interstitial and alveolar airspace opacities. Findings are suggestive of CHF with pulmonary edema. A superimposed infectious process would be difficult to exclude. Electronically Signed   By: Davina Poke D.O.  On: 12/26/2019 14:58     Phillips Climes M.D on 12/27/2019 at 2:43 PM  Between 7am to 7pm - Pager - [phone]  After 7pm go to [email or web address] - password [email or web address]  [facility] -  Office  [phone]

## 2019-12-27 NOTE — Plan of Care (Signed)
Return home independently

## 2019-12-27 NOTE — Plan of Care (Signed)
  Problem: Education: Goal: Knowledge of risk factors and measures for prevention of condition will improve Outcome: Progressing   Problem: Coping: Goal: Psychosocial and spiritual needs will be supported Outcome: Progressing   Problem: Respiratory: Goal: Will maintain a patent airway Outcome: Progressing Goal: Complications related to the disease process, condition or treatment will be avoided or minimized Outcome: Progressing   

## 2019-12-28 LAB — BRAIN NATRIURETIC PEPTIDE: B Natriuretic Peptide: 57.5 pg/mL (ref 0.0–100.0)

## 2019-12-28 LAB — COMPREHENSIVE METABOLIC PANEL
ALT: 30 U/L (ref 0–44)
AST: 24 U/L (ref 15–41)
Albumin: 3.4 g/dL — ABNORMAL LOW (ref 3.5–5.0)
Alkaline Phosphatase: 263 U/L — ABNORMAL HIGH (ref 38–126)
Anion gap: 13 (ref 5–15)
BUN: 63 mg/dL — ABNORMAL HIGH (ref 8–23)
CO2: 21 mmol/L — ABNORMAL LOW (ref 22–32)
Calcium: 8 mg/dL — ABNORMAL LOW (ref 8.9–10.3)
Chloride: 104 mmol/L (ref 98–111)
Creatinine, Ser: 2.41 mg/dL — ABNORMAL HIGH (ref 0.44–1.00)
GFR calc Af Amer: 24 mL/min — ABNORMAL LOW (ref >60)
GFR calc non Af Amer: 21 mL/min — ABNORMAL LOW (ref >60)
Glucose, Bld: 188 mg/dL — ABNORMAL HIGH (ref 70–99)
Potassium: 4.7 mmol/L (ref 3.5–5.1)
Sodium: 138 mmol/L (ref 135–145)
Total Bilirubin: 0.5 mg/dL (ref 0.3–1.2)
Total Protein: 8.1 g/dL (ref 6.5–8.1)

## 2019-12-28 LAB — GLUCOSE, CAPILLARY
Glucose-Capillary: 271 mg/dL — ABNORMAL HIGH (ref 70–99)
Glucose-Capillary: 187 mg/dL — ABNORMAL HIGH (ref 70–99)
Glucose-Capillary: 236 mg/dL — ABNORMAL HIGH (ref 70–99)
Glucose-Capillary: 270 mg/dL — ABNORMAL HIGH (ref 70–99)
Glucose-Capillary: 350 mg/dL — ABNORMAL HIGH (ref 70–99)
Glucose-Capillary: 322 mg/dL — ABNORMAL HIGH (ref 70–99)
Glucose-Capillary: 371 mg/dL — ABNORMAL HIGH (ref 70–99)

## 2019-12-28 LAB — CBC WITH DIFFERENTIAL/PLATELET
Abs Immature Granulocytes: 0.02 10*3/uL (ref 0.00–0.07)
Basophils Absolute: 0 10*3/uL (ref 0.0–0.1)
Basophils Relative: 0 %
Eosinophils Absolute: 0 10*3/uL (ref 0.0–0.5)
Eosinophils Relative: 0 %
HCT: 34.8 % — ABNORMAL LOW (ref 36.0–46.0)
Hemoglobin: 11.2 g/dL — ABNORMAL LOW (ref 12.0–15.0)
Immature Granulocytes: 0 %
Lymphocytes Relative: 23 %
Lymphs Abs: 1.4 10*3/uL (ref 0.7–4.0)
MCH: 28.6 pg (ref 26.0–34.0)
MCHC: 32.2 g/dL (ref 30.0–36.0)
MCV: 89 fL (ref 80.0–100.0)
Monocytes Absolute: 0.2 10*3/uL (ref 0.1–1.0)
Monocytes Relative: 3 %
Neutro Abs: 4.4 10*3/uL (ref 1.7–7.7)
Neutrophils Relative %: 74 %
Platelets: 201 10*3/uL (ref 150–400)
RBC: 3.91 MIL/uL (ref 3.87–5.11)
RDW: 13.2 % (ref 11.5–15.5)
WBC: 6 10*3/uL (ref 4.0–10.5)
nRBC: 0 % (ref 0.0–0.2)

## 2019-12-28 LAB — MAGNESIUM: Magnesium: 2.3 mg/dL (ref 1.7–2.4)

## 2019-12-28 LAB — C-REACTIVE PROTEIN: CRP: 7.4 mg/dL — ABNORMAL HIGH (ref <1.0)

## 2019-12-28 LAB — PHOSPHORUS: Phosphorus: 5.5 mg/dL — ABNORMAL HIGH (ref 2.5–4.6)

## 2019-12-28 LAB — D-DIMER, QUANTITATIVE: D-Dimer, Quant: 0.46 ug/mL-FEU (ref 0.00–0.50)

## 2019-12-28 MED ORDER — FUROSEMIDE 20 MG PO TABS: 40.0000 mg | ORAL_TABLET | Freq: Every day | ORAL | Status: DC

## 2019-12-28 MED ORDER — INSULIN GLARGINE 100 UNIT/ML ~~LOC~~ SOLN
75.0000 [IU] | Freq: Every day | SUBCUTANEOUS | Status: DC
  Administered 2019-12-28: 09:00:00 75 [IU] via SUBCUTANEOUS
  Filled 2019-12-28 (×2): qty 0.75

## 2019-12-28 MED ORDER — CARVEDILOL 3.125 MG PO TABS: 3.1250 mg | ORAL_TABLET | Freq: Two times a day (BID) | ORAL | Status: DC

## 2019-12-28 MED ORDER — GLUCERNA SHAKE PO LIQD: 237.0000 mL | Freq: Three times a day (TID) | ORAL | Status: DC

## 2019-12-28 NOTE — Plan of Care (Signed)
  Problem: Education: Goal: Knowledge of risk factors and measures for prevention of condition will improve Outcome: Progressing   Problem: Coping: Goal: Psychosocial and spiritual needs will be supported Outcome: Progressing   Problem: Respiratory: Goal: Will maintain a patent airway Outcome: Progressing Goal: Complications related to the disease process, condition or treatment will be avoided or minimized Outcome: Progressing   

## 2019-12-28 NOTE — Progress Notes (Signed)
PROGRESS NOTE                                                                                                                                                                                                             Patient Demographics:    Maureen Jackson, is a 64 y.o. female, DOB - [birth year], ZOX:[id number]  Admit date - 12/26/2019   Admitting Physician No admitting provider for patient encounter.  Outpatient Primary MD for the patient is Neale Burly, MD  LOS - 2   Chief Complaint  Patient presents with  . Weakness       Brief Narrative    64 y.o. female with a history of COPD/asthma, coronary artery disease with stents, chronic systolic heart failure with ischemic cardiomyopathy and EF of approximately 35%.  Hypertension, thyroid disease, type 2 diabetes.  Patient seen for worsening shortness of breath over the past 2 to 3 days.  Work-up significant for Covid positive.  Possible pulmonary vascular congestion with possible superimposed infectious process.  Patient febrile.  CRP elevated to 10.5.  Lactic acid normal.  Procalcitonin 0.15.  LDH 255 normal BNP.    Subjective:    Maureen Jackson today reports some dyspnea, mild cough, remains nonproductive, no fever or chest pain .   Assessment  & Plan :    Principal Problem:   Acute respiratory disease due to COVID-19 virus Active Problems:   Hypothyroidism   Essential hypertension   GERD   Morbid obesity/BMI > 55   Diabetes mellitus type 2 with complications, uncontrolled ([place])   Ischemic cardiomyopathy   Chronic combined systolic and diastolic CHF (congestive heart failure) (EF 30 to 35 %)  Acute hypoxic respiratory failure due to COVID-19 pneumonia -Chest x-ray significant for multiple opacities, she is on 2 L nasal cannula. -Continue with  steroids. -Continue with IV remdesivir -He was encouraged to use incentive spirometry, flutter valve, out of bed to chair and modified  proning. -Continue to trend inflammatory markers closely especially with CRP more than 10 on admission, D-dimers has normalized, CRP trending down which is reassuring.Marland Kitchen  COVID-19 Labs  Recent Labs    12/26/19 1546 12/27/19 0212 12/28/19 0150  DDIMER 0.67* 0.52* 0.46  FERRITIN 188  --   --   LDH 255*  --   --   CRP 10.5* 10.1* 7.4*  Lab Results  Component Value Date   SARSCOV2NAA NEGATIVE 09/29/2019   [place] NEGATIVE 09/25/2019   [place] NEGATIVE 09/08/2019   Acute on chronic systolic/diastolic CHF. -Chest x-ray significant for vascular congestion/questionable pulmonary edema. -Continue with Coreg, Entresto and Aldactone (will decrease Coreg given bradycardia) -She is improving with IV diuresis, but I will hold currently in the setting of soft blood pressure and bradycardia.  Type 2 diabetes mellitus -Started on insulin sliding scale, hold Jardiance, started on Tradjenta -We will increase her Lantus to 75 units giving CBG remains uncontrolled, most likely in the setting of COVID-19 infection and steroids -Continue with insulin sliding scale  Ischemic cardiomyopathy -Continue with Plavix -See discussion below regarding diuresis, and medications.  Hypertension -Continue with home medication  GERD -Continue with PPI  Hypothyroidism -Continue with Synthroid.   Code Status : Full  Disposition Plan  : Home  Barriers For Discharge : Hypoxic, on IV remdesivir  Consults  :  None  Procedures  : None  DVT Prophylaxis  :  [place] lovenox   Lab Results  Component Value Date   PLT 201 12/28/2019    Antibiotics  :   Anti-infectives (From admission, onward)   Start     Dose/Rate Route Frequency Ordered Stop   12/27/19 1000  remdesivir 100 mg in sodium chloride 0.9 % 100 mL IVPB     100 mg 200 mL/hr over 30 Minutes Intravenous Daily 12/26/19 2023 12/31/19 0959   12/26/19 2100  remdesivir 200 mg in sodium chloride 0.9% 250 mL IVPB     200 mg 580 mL/hr over  30 Minutes Intravenous Once 12/26/19 2023 12/26/19 2315        Objective:   Vitals:   12/27/19 1935 12/28/19 0450 12/28/19 0456 12/28/19 0713  BP: 105/62 92/68  106/64  Pulse: 60 (!) 49 (!) 50 (!) 56  Resp: 16 12 14 14   Temp: 97.8 F (36.6 C) 98.1 F (36.7 C)  (!) 97.5 F (36.4 C)  TempSrc: Oral   Oral  SpO2: 95% 93% 94% 98%  Weight:      Height:        Wt Readings from Last 3 Encounters:  12/26/19 (!) 152 kg  10/02/19 (!) 156.2 kg  09/06/19 (!) 152 kg     Intake/Output Summary (Last 24 hours) at 12/28/2019 1240 Last data filed at 12/27/2019 1821 Gross per 24 hour  Intake 360 ml  Output --  Net 360 ml     Physical Exam  Awake Alert, Oriented X 3, No new F.N deficits, Normal affect Symmetrical Chest wall movement, Good air movement bilaterally, CTAB RRR,No Gallops,Rubs or new Murmurs, No Parasternal Heave +ve B.Sounds, Abd Soft, No tenderness, No rebound - guarding or rigidity. No Cyanosis, Clubbing or edema, No new Rash or bruise      Data Review:    CBC Recent Labs  Lab 12/26/19 1543 12/27/19 0212 12/28/19 0150  WBC 5.2 3.4* 6.0  HGB 10.9* 11.1* 11.2*  HCT 34.3* 34.7* 34.8*  PLT 159 157 201  MCV 91.5 89.7 89.0  MCH 29.1 28.7 28.6  MCHC 31.8 32.0 32.2  RDW 12.9 13.0 13.2  LYMPHSABS 1.8 1.3 1.4  MONOABS 0.4 0.1 0.2  EOSABS 0.0 0.0 0.0  BASOSABS 0.0 0.0 0.0    Chemistries  Recent Labs  Lab 12/26/19 1500 12/27/19 0212 12/28/19 0150 12/28/19 0800  NA 138  --   --  138  K 4.0  --   --  4.7  CL 106  --   --  104  CO2 22  --   --  21*  GLUCOSE 180*  --   --  188*  BUN 28*  --   --  63*  CREATININE 1.75*  --   --  2.41*  CALCIUM 8.0*  --   --  8.0*  MG  --  2.0 2.3  --   AST 33  --   --  24  ALT 38  --   --  30  ALKPHOS 320*  --   --  263*  BILITOT 1.1  --   --  0.5   ------------------------------------------------------------------------------------------------------------------ Recent Labs    12/26/19 1546  TRIG 124    Lab  Results  Component Value Date   HGBA1C 10.2 (H) 09/08/2019   ------------------------------------------------------------------------------------------------------------------ No results for input(s): TSH, T4TOTAL, T3FREE, THYROIDAB in the last 72 hours.  Invalid input(s): FREET3 ------------------------------------------------------------------------------------------------------------------ Recent Labs    12/26/19 1546  FERRITIN 188    Coagulation profile No results for input(s): INR, PROTIME in the last 168 hours.  Recent Labs    12/27/19 0212 12/28/19 0150  DDIMER 0.52* 0.46    Cardiac Enzymes No results for input(s): CKMB, TROPONINI, MYOGLOBIN in the last 168 hours.  Invalid input(s): CK ------------------------------------------------------------------------------------------------------------------    Component Value Date/Time   BNP 57.5 12/28/2019 0150    Inpatient Medications  Scheduled Meds: . vitamin C  500 mg Oral Daily  . aspirin EC  81 mg Oral Daily  . atorvastatin  80 mg Oral q1800  . carvedilol  3.125 mg Oral BID WC  . clopidogrel  75 mg Oral Daily  . dexamethasone  6 mg Oral Q24H  . doxepin  10 mg Oral QHS  . enoxaparin (LOVENOX) injection  75 mg Subcutaneous Q24H  . furosemide  40 mg Intravenous BID  . insulin aspart  0-15 Units Subcutaneous Q4H  . insulin aspart  15 Units Subcutaneous TID WC  . insulin glargine  75 Units Subcutaneous Daily  . levothyroxine  125 mcg Oral Q0600  . linagliptin  5 mg Oral Daily  . nystatin   Topical Q8H  . pantoprazole  40 mg Oral Daily  . sacubitril-valsartan  1 tablet Oral BID  . spironolactone  25 mg Oral Daily  . timolol  1 drop Both Eyes BID  . zinc sulfate  220 mg Oral Daily   Continuous Infusions: . remdesivir 100 mg in NS 100 mL 100 mg (12/28/19 0924)   PRN Meds:.albuterol, ondansetron, traMADol  Micro Results Recent Results (from the past 240 hour(s))  Blood Culture (routine x 2)     Status:  None (Preliminary result)   Collection Time: 12/26/19  3:46 PM   Specimen: BLOOD LEFT ARM  Result Value Ref Range Status   Specimen Description BLOOD LEFT ARM  Final   Special Requests   Final    BOTTLES DRAWN AEROBIC AND ANAEROBIC Blood Culture adequate volume   Culture   Final    NO GROWTH < 24 HOURS Performed at [facility], [address] [place], [place] [address]    Report Status PENDING  Incomplete  Blood Culture (routine x 2)     Status: None (Preliminary result)   Collection Time: 12/26/19  5:13 PM   Specimen: Left Antecubital; Blood  Result Value Ref Range Status   Specimen Description LEFT ANTECUBITAL  Final   Special Requests   Final    BOTTLES DRAWN AEROBIC AND ANAEROBIC Blood Culture adequate volume   Culture   Final  NO GROWTH < 12 HOURS Performed at [facility], [address], [place], [place] [address]    Report Status PENDING  Incomplete    Radiology Reports DG Chest Port 1 View  Result Date: 12/26/2019 CLINICAL DATA:  Cough, weakness EXAM: PORTABLE CHEST 1 VIEW COMPARISON:  09/15/2019 FINDINGS: Moderate cardiomegaly, stable. Pulmonary vascular congestion. Diffuse interstitial and alveolar airspace opacities throughout both lungs. No large pleural fluid collection. No pneumothorax. IMPRESSION: Cardiomegaly and pulmonary vascular congestion with diffuse interstitial and alveolar airspace opacities. Findings are suggestive of CHF with pulmonary edema. A superimposed infectious process would be difficult to exclude. Electronically Signed   By: Davina Poke D.O.   On: 12/26/2019 14:58     Phillips Climes M.D on 12/28/2019 at 12:40 PM  Between 7am to 7pm - Pager - [phone]  After 7pm go to [email or web address] - password [facility]  [facility] -  Office  [phone]

## 2019-12-29 ENCOUNTER — Telehealth: Payer: Medicare HMO | Admitting: Cardiology

## 2019-12-29 LAB — MAGNESIUM: Magnesium: 2.3 mg/dL (ref 1.7–2.4)

## 2019-12-29 LAB — COMPREHENSIVE METABOLIC PANEL
ALT: 31 U/L (ref 0–44)
AST: 27 U/L (ref 15–41)
Albumin: 3 g/dL — ABNORMAL LOW (ref 3.5–5.0)
Alkaline Phosphatase: 237 U/L — ABNORMAL HIGH (ref 38–126)
Anion gap: 11 (ref 5–15)
BUN: 82 mg/dL — ABNORMAL HIGH (ref 8–23)
CO2: 20 mmol/L — ABNORMAL LOW (ref 22–32)
Calcium: 7.8 mg/dL — ABNORMAL LOW (ref 8.9–10.3)
Chloride: 104 mmol/L (ref 98–111)
Creatinine, Ser: 2.67 mg/dL — ABNORMAL HIGH (ref 0.44–1.00)
GFR calc Af Amer: 21 mL/min — ABNORMAL LOW (ref >60)
GFR calc non Af Amer: 19 mL/min — ABNORMAL LOW (ref >60)
Glucose, Bld: 254 mg/dL — ABNORMAL HIGH (ref 70–99)
Potassium: 4.8 mmol/L (ref 3.5–5.1)
Sodium: 135 mmol/L (ref 135–145)
Total Bilirubin: 0.8 mg/dL (ref 0.3–1.2)
Total Protein: 7.8 g/dL (ref 6.5–8.1)

## 2019-12-29 LAB — HEMOGLOBIN A1C
Hgb A1c MFr Bld: 8.9 % — ABNORMAL HIGH (ref 4.8–5.6)
Mean Plasma Glucose: 208.73 mg/dL

## 2019-12-29 LAB — GLUCOSE, CAPILLARY
Glucose-Capillary: 242 mg/dL — ABNORMAL HIGH (ref 70–99)
Glucose-Capillary: 225 mg/dL — ABNORMAL HIGH (ref 70–99)
Glucose-Capillary: 223 mg/dL — ABNORMAL HIGH (ref 70–99)
Glucose-Capillary: 228 mg/dL — ABNORMAL HIGH (ref 70–99)
Glucose-Capillary: 301 mg/dL — ABNORMAL HIGH (ref 70–99)

## 2019-12-29 LAB — CBC WITH DIFFERENTIAL/PLATELET
Abs Immature Granulocytes: 0.03 10*3/uL (ref 0.00–0.07)
Basophils Absolute: 0 10*3/uL (ref 0.0–0.1)
Basophils Relative: 0 %
Eosinophils Absolute: 0 10*3/uL (ref 0.0–0.5)
Eosinophils Relative: 0 %
HCT: 36.2 % (ref 36.0–46.0)
Hemoglobin: 12 g/dL (ref 12.0–15.0)
Immature Granulocytes: 0 %
Lymphocytes Relative: 15 %
Lymphs Abs: 1.3 10*3/uL (ref 0.7–4.0)
MCH: 28.9 pg (ref 26.0–34.0)
MCHC: 33.1 g/dL (ref 30.0–36.0)
MCV: 87.2 fL (ref 80.0–100.0)
Monocytes Absolute: 0.3 10*3/uL (ref 0.1–1.0)
Monocytes Relative: 4 %
Neutro Abs: 7 10*3/uL (ref 1.7–7.7)
Neutrophils Relative %: 81 %
Platelets: 210 10*3/uL (ref 150–400)
RBC: 4.15 MIL/uL (ref 3.87–5.11)
RDW: 13.2 % (ref 11.5–15.5)
WBC: 8.7 10*3/uL (ref 4.0–10.5)
nRBC: 0 % (ref 0.0–0.2)

## 2019-12-29 LAB — D-DIMER, QUANTITATIVE: D-Dimer, Quant: 0.33 ug/mL-FEU (ref 0.00–0.50)

## 2019-12-29 LAB — C-REACTIVE PROTEIN: CRP: 5.2 mg/dL — ABNORMAL HIGH (ref <1.0)

## 2019-12-29 LAB — PHOSPHORUS: Phosphorus: 5.7 mg/dL — ABNORMAL HIGH (ref 2.5–4.6)

## 2019-12-29 MED ORDER — GUAIFENESIN ER 600 MG PO TB12
1200.0000 mg | ORAL_TABLET | Freq: Two times a day (BID) | ORAL | Status: DC
  Administered 2019-12-29 (×2): 1200 mg via ORAL
  Filled 2019-12-29 (×4): qty 2

## 2019-12-29 MED ORDER — SODIUM CHLORIDE 0.9 % IV SOLN: INTRAVENOUS | Status: AC

## 2019-12-29 MED ORDER — INSULIN ASPART 100 UNIT/ML ~~LOC~~ SOLN
0.0000 [IU] | Freq: Three times a day (TID) | SUBCUTANEOUS | Status: DC
  Administered 2019-12-29 (×3): 7 [IU] via SUBCUTANEOUS
  Administered 2019-12-30 (×3): 20 [IU] via SUBCUTANEOUS
  Administered 2019-12-31: 08:00:00 15 [IU] via SUBCUTANEOUS

## 2019-12-29 MED ORDER — INSULIN GLARGINE 100 UNIT/ML ~~LOC~~ SOLN
55.0000 [IU] | Freq: Two times a day (BID) | SUBCUTANEOUS | Status: DC
  Administered 2019-12-29 – 2019-12-30 (×3): 55 [IU] via SUBCUTANEOUS
  Filled 2019-12-29 (×4): qty 0.55

## 2019-12-29 MED ORDER — INSULIN ASPART 100 UNIT/ML ~~LOC~~ SOLN
0.0000 [IU] | Freq: Every day | SUBCUTANEOUS | Status: DC
  Administered 2019-12-29: 21:00:00 4 [IU] via SUBCUTANEOUS
  Administered 2019-12-30: 5 [IU] via SUBCUTANEOUS

## 2019-12-29 NOTE — Plan of Care (Signed)
  Problem: Education: Goal: Knowledge of risk factors and measures for prevention of condition will improve Outcome: Progressing   Problem: Coping: Goal: Psychosocial and spiritual needs will be supported Outcome: Progressing   Problem: Respiratory: Goal: Will maintain a patent airway Outcome: Progressing Goal: Complications related to the disease process, condition or treatment will be avoided or minimized Outcome: Progressing   

## 2019-12-29 NOTE — Progress Notes (Signed)
Occupational Therapy Evaluation Patient Details Name: Maureen Jackson MRN: [id number] DOB: [birth year] Today's Date: 12/29/2019    History of Present Illness Maureen Jackson is a 64 y.o. female with a history of COPD/asthma, coronary artery disease with stents, chronic systolic heart failure with ischemic cardiomyopathy and EF of approximately 35%.  Hypertension, thyroid disease, type 2 diabetes.  Patient seen for worsening shortness of breath over the past 2 to 3 days.  She has possible contacts of her grandson and her daughter who are in and out of her home.  Due to increasing shortness of breath that is worse with exertion, the patient came to emergency department for evaluation.  The patient does not feel that this is consistent with her normal CHF.   Clinical Impression   PTA pt lived alone, independent in all ADL, IADL, and mobility tasks. Pt does not ambulate with an assistive device and reports 2 falls in the last 6 months. Pt still drives. Pt currently independent to min assist for self-care and mobility tasks. Pt able to ambulate to/from bathroom with RW and min guard, noting 0 instances of loss of balance. Pt tolerated standing 1 x 3 min at the sink to complete grooming/hygiene tasks. SpO2 dropped to 84% on room air following activity, noting quick return back to 90s following seated rest break. No reports of shortness of breath throughout. Educated pt on energy conservation strategies with fair understanding. Pt demonstrates decreased strength, endurance, balance, standing tolerance, and activity tolerance impacting ability to complete self-care and functional transfer tasks. Recommend skilled OT services to address above deficits in order to promote function and prevent further decline. Recommend [facility] OT for continued rehab following hospital discharge.     Follow Up Recommendations  Home health OT;Supervision - Intermittent    Equipment Recommendations  None recommended by OT     Recommendations for Other Services       Precautions / Restrictions Precautions Precautions: Fall Restrictions Weight Bearing Restrictions: No      Mobility Bed Mobility               General bed mobility comments: Pt seated in bedside chair upon OT arrival.  Transfers Overall transfer level: Needs assistance Equipment used: Rolling walker (2 wheeled) Transfers: Sit to/from [facility] Sit to Stand: Min guard Stand pivot transfers: Min guard       General transfer comment: Noted 0 instances of LOB    Balance Overall balance assessment: Mild deficits observed, not formally tested                                         ADL either performed or assessed with clinical judgement   ADL Overall ADL's : Needs assistance/impaired Eating/Feeding: Independent;Sitting   Grooming: Set up;Supervision/safety;Standing   Upper Body Bathing: Set up;Supervision/ safety;Sitting   Lower Body Bathing: Minimal assistance;Sit to/from stand;Sitting/lateral leans   Upper Body Dressing : Set up;Supervision/safety;Sitting   Lower Body Dressing: Minimal assistance;Sit to/from stand;Sitting/lateral leans   Toilet Transfer: Nature conservation officer;Ambulation;Grab bars   Toileting- Clothing Manipulation and Hygiene: Min guard;Sitting/lateral lean;Sit to/from stand       Functional mobility during ADLs: Min guard;Rolling walker General ADL Comments: Pt able to ambulate to/from bathroom with RW and min guard. Noted 0 instances of LOB throughout.     Vision Baseline Vision/History: Wears glasses Wears Glasses: At all times  Perception     Praxis      Pertinent Vitals/Pain Pain Assessment: No/denies pain     Hand Dominance Right   Extremity/Trunk Assessment Upper Extremity Assessment Upper Extremity Assessment: Overall WFL for tasks assessed   Lower Extremity Assessment Lower Extremity Assessment: Defer to PT evaluation        Communication Communication Communication: No difficulties   Cognition Arousal/Alertness: Awake/alert Behavior During Therapy: Flat affect Overall Cognitive Status: Within Functional Limits for tasks assessed                                     General Comments  Pt on room air with SpO2 98% at rest. SpO2 decreased to 84% following mobility, noting quick return back to 90s following seated rest break. No reports of anxiety related to SOB.     Exercises Exercises: Other exercises Other Exercises Other Exercises: Incentive spirometer x 10 with min cues on technique. Pulling 665mL. Other Exercises: Flutter valve x 10 with min cues on technique.   Shoulder Instructions      Home Living Family/patient expects to be discharged to:: Private residence Living Arrangements: Alone Available Help at Discharge: Friend(s);Family(daughter can help but works part time) Type of Home: Apartment(1st floor) Home Access: Level entry     [place]: One level     Bathroom Shower/Tub: Teacher, early years/pre: [place] - single point;Walker - 4 wheels;Shower seat;Grab bars - tub/shower;Hand held shower head;Other (comment)(sock aide)          Prior Functioning/Environment Level of Independence: Independent        Comments: Pt independent in ADLs, IADLs, and mobility PTA. Pt does not ambulate with an assistive device and reports 2 falls in the last 6 months. Pt still drives.        OT Problem List: Decreased strength;Decreased activity tolerance;Impaired balance (sitting and/or standing);Cardiopulmonary status limiting activity      OT Treatment/Interventions: Self-care/ADL training;Therapeutic exercise;Neuromuscular education;Energy conservation;DME and/or AE instruction;Therapeutic activities;Patient/family education;Balance training    OT Goals(Current goals can be found in the care plan section) Acute Rehab OT Goals Patient Stated  Goal: to go home Time For Goal Achievement: 01/12/20 Potential to Achieve Goals: Good ADL Goals Pt Will Perform Grooming: with modified independence;standing Pt Will Perform Lower Body Bathing: with modified independence;sit to/from stand Pt Will Perform Lower Body Dressing: with modified independence;sit to/from stand Pt Will Transfer to Toilet: with modified independence;ambulating;regular height toilet Pt Will Perform Toileting - Clothing Manipulation and hygiene: with modified independence;sit to/from stand Additional ADL Goal #1: Pt to recall and verbalize 3 fall prevention strategies with 0 verbal cues. Additional ADL Goal #2: Pt to recall and verbalize 3 energy conservation techniques with 0 verbal cues. Additional ADL Goal #3: Pt to tolerate standing up to 10 min with modified independence, in preparation for ADLs.  OT Frequency: Min 3X/week   Barriers to D/C:            Co-evaluation              AM-PAC OT "6 Clicks" Daily Activity     Outcome Measure Help from another person eating meals?: None Help from another person taking care of personal grooming?: A Little Help from another person toileting, which includes using toliet, bedpan, or urinal?: A Little Help from another person bathing (including washing, rinsing, drying)?: A Little Help from another person to  put on and taking off regular upper body clothing?: A Little Help from another person to put on and taking off regular lower body clothing?: A Little 6 Click Score: 19   End of Session Equipment Utilized During Treatment: Rolling walker Nurse Communication: Mobility status  Activity Tolerance: Patient tolerated treatment well Patient left: in chair;with call bell/phone within reach  OT Visit Diagnosis: Unsteadiness on feet (R26.81);Muscle weakness (generalized) (M62.81)                Time: 0973-5329 OT Time Calculation (min): 30 min Charges:  OT General Charges $OT Visit: 1 Visit OT Evaluation $OT  Eval Low Complexity: 1 Low OT Treatments $Self Care/Home Management : 8-22 mins  Mauri Brooklyn OTR/L [phone]   Mauri Brooklyn 12/29/2019, 1:44 PM

## 2019-12-29 NOTE — Progress Notes (Signed)
Patient declined nurse updating family, stated that she would update them.

## 2019-12-29 NOTE — Progress Notes (Signed)
PROGRESS NOTE                                                                                                                                                                                                             Patient Demographics:    Maureen Jackson, is a 64 y.o. female, DOB - March 05, 1956, JKK:[id number]  Admit date - 12/26/2019   Admitting Physician No admitting provider for patient encounter.  Outpatient Primary MD for the patient is Neale Burly, MD  LOS - 3   Chief Complaint  Patient presents with  . Weakness       Brief Narrative    64 y.o. female with a history of COPD/asthma, coronary artery disease with stents, chronic systolic heart failure with ischemic cardiomyopathy and EF of approximately 35%.  Hypertension, thyroid disease, type 2 diabetes.  Patient seen for worsening shortness of breath over the past 2 to 3 days.  Work-up significant for Covid positive.  Possible pulmonary vascular congestion with possible superimposed infectious process.  Patient febrile.  CRP elevated to 10.5.  Lactic acid normal.  Procalcitonin 0.15.  LDH 255 normal BNP.    Subjective:    Maureen Jackson today reports some dyspnea, mild cough, is more productive today, denies any chest pain or fever.   Assessment  & Plan :    Principal Problem:   Acute respiratory disease due to COVID-19 virus Active Problems:   Hypothyroidism   Essential hypertension   GERD   Morbid obesity/BMI > 55   Diabetes mellitus type 2 with complications, uncontrolled ([place])   Ischemic cardiomyopathy   Chronic combined systolic and diastolic CHF (congestive heart failure) (EF 30 to 35 %)  Acute hypoxic respiratory failure due to COVID-19 pneumonia -Chest x-ray significant for multiple opacities, she is on room air today -Continue with  steroids. -Continue with IV remdesivir -Does report some phlegm, I have encouraged her to use flutter valve, and will add some Mucinex as  well. -She was encouraged to use incentive spirometry, flutter valve, out of bed to chair and modified proning. -Continue to trend inflammatory markers closely especially with CRP more than 10 on admission, D-dimers has normalized, CRP trending down which is reassuring.Marland Kitchen  COVID-19 Labs  Recent Labs    12/26/19 1546 12/26/19 1546 12/27/19 0212 12/28/19 0150 12/29/19 0216 12/29/19 0217  DDIMER 0.67*   < >  0.52* 0.46 0.33  --   FERRITIN 188  --   --   --   --   --   LDH 255*  --   --   --   --   --   CRP 10.5*   < > 10.1* 7.4*  --  5.2*   < > = values in this interval not displayed.    Lab Results  Component Value Date   SARSCOV2NAA NEGATIVE 09/29/2019   [place] NEGATIVE 09/25/2019   [place] NEGATIVE 09/08/2019   Acute on chronic systolic/diastolic CHF. -Chest x-ray significant for vascular congestion/questionable pulmonary edema. -Worsening renal function, and soft blood pressure, I have stopped her Entresto and Aldactone, as well her Coreg has been stopped given her bradycardia. -Continue to hold diuresis, please see discussion below under AKI.  AKI on CKD stage III -Fattening is trending up, I have stopped Entresto, and have stopped Imdur and Coreg to avoid soft blood pressure, as well her diuresis on hold, as she has been negative for total of 500 cc of fluid over next 10 hours.  Type 2 diabetes mellitus -Started on insulin sliding scale, hold Jardiance,  -Have stopped Tradjenta given her CHF -Remains poorly controlled, I have increased her insulin 200 units, and change sliding scale to resistant .  Ischemic cardiomyopathy -Continue with Plavix -See discussion below regarding diuresis, and medications.  Hypertension -Off medication to avoid soft blood pressure  GERD -Continue with PPI  Hypothyroidism -Continue with Synthroid.   Code Status : Full  Disposition Plan  : Home  Barriers For Discharge : Hypoxic, on IV remdesivir  Consults  :   None  Procedures  : None  DVT Prophylaxis  :  [place] lovenox   Lab Results  Component Value Date   PLT 210 12/29/2019    Antibiotics  :   Anti-infectives (From admission, onward)   Start     Dose/Rate Route Frequency Ordered Stop   12/27/19 1000  remdesivir 100 mg in sodium chloride 0.9 % 100 mL IVPB     100 mg 200 mL/hr over 30 Minutes Intravenous Daily 12/26/19 2023 12/31/19 0959   12/26/19 2100  remdesivir 200 mg in sodium chloride 0.9% 250 mL IVPB     200 mg 580 mL/hr over 30 Minutes Intravenous Once 12/26/19 2023 12/26/19 2315        Objective:   Vitals:   12/28/19 1542 12/28/19 2050 12/29/19 0505 12/29/19 0834  BP: (!) 108/58 127/74 (!) 118/96 (!) 112/99  Pulse: (!) 56 (!) 59 (!) 58 (!) 120  Resp: 16 14 13 14   Temp: 97.6 F (36.4 C) 97.9 F (36.6 C) 98.1 F (36.7 C) (!) 97.3 F (36.3 C)  TempSrc: Oral Oral Oral Oral  SpO2: 100% 97% 95% 94%  Weight:      Height:        Wt Readings from Last 3 Encounters:  12/26/19 (!) 152 kg  10/02/19 (!) 156.2 kg  09/06/19 (!) 152 kg     Intake/Output Summary (Last 24 hours) at 12/29/2019 1327 Last data filed at 12/29/2019 0900 Gross per 24 hour  Intake 370 ml  Output 0 ml  Net 370 ml     Physical Exam  Awake Alert, Oriented X 3, No new F.N deficits, Normal affect Symmetrical Chest wall movement, Good air movement bilaterally, CTAB RRR,No Gallops,Rubs or new Murmurs, No Parasternal Heave +ve B.Sounds, Abd Soft, No tenderness, No rebound - guarding or rigidity. No Cyanosis, Clubbing or edema, No new Rash or  bruise       Data Review:    CBC Recent Labs  Lab 12/26/19 1543 12/27/19 0212 12/28/19 0150 12/29/19 0216  WBC 5.2 3.4* 6.0 8.7  HGB 10.9* 11.1* 11.2* 12.0  HCT 34.3* 34.7* 34.8* 36.2  PLT 159 157 201 210  MCV 91.5 89.7 89.0 87.2  MCH 29.1 28.7 28.6 28.9  MCHC 31.8 32.0 32.2 33.1  RDW 12.9 13.0 13.2 13.2  LYMPHSABS 1.8 1.3 1.4 1.3  MONOABS 0.4 0.1 0.2 0.3  EOSABS 0.0 0.0 0.0 0.0  BASOSABS 0.0  0.0 0.0 0.0    Chemistries  Recent Labs  Lab 12/26/19 1500 12/27/19 0212 12/28/19 0150 12/28/19 0800 12/29/19 0216  NA 138  --   --  138 135  K 4.0  --   --  4.7 4.8  CL 106  --   --  104 104  CO2 22  --   --  21* 20*  GLUCOSE 180*  --   --  188* 254*  BUN 28*  --   --  63* 82*  CREATININE 1.75*  --   --  2.41* 2.67*  CALCIUM 8.0*  --   --  8.0* 7.8*  MG  --  2.0 2.3  --  2.3  AST 33  --   --  24 27  ALT 38  --   --  30 31  ALKPHOS 320*  --   --  263* 237*  BILITOT 1.1  --   --  0.5 0.8   ------------------------------------------------------------------------------------------------------------------ Recent Labs    12/26/19 1546  TRIG 124    Lab Results  Component Value Date   HGBA1C 8.9 (H) 12/29/2019   ------------------------------------------------------------------------------------------------------------------ No results for input(s): TSH, T4TOTAL, T3FREE, THYROIDAB in the last 72 hours.  Invalid input(s): FREET3 ------------------------------------------------------------------------------------------------------------------ Recent Labs    12/26/19 1546  FERRITIN 188    Coagulation profile No results for input(s): INR, PROTIME in the last 168 hours.  Recent Labs    12/28/19 0150 12/29/19 0216  DDIMER 0.46 0.33    Cardiac Enzymes No results for input(s): CKMB, TROPONINI, MYOGLOBIN in the last 168 hours.  Invalid input(s): CK ------------------------------------------------------------------------------------------------------------------    Component Value Date/Time   BNP 57.5 12/28/2019 0150    Inpatient Medications  Scheduled Meds: . vitamin C  500 mg Oral Daily  . aspirin EC  81 mg Oral Daily  . atorvastatin  80 mg Oral q1800  . clopidogrel  75 mg Oral Daily  . dexamethasone  6 mg Oral Q24H  . doxepin  10 mg Oral QHS  . enoxaparin (LOVENOX) injection  75 mg Subcutaneous Q24H  . insulin aspart  0-20 Units Subcutaneous TID WC  .  insulin aspart  0-5 Units Subcutaneous QHS  . insulin aspart  15 Units Subcutaneous TID WC  . insulin glargine  55 Units Subcutaneous Q12H  . levothyroxine  125 mcg Oral Q0600  . nystatin   Topical Q8H  . pantoprazole  40 mg Oral Daily  . timolol  1 drop Both Eyes BID  . zinc sulfate  220 mg Oral Daily   Continuous Infusions: . sodium chloride 50 mL/hr at 12/29/19 0917  . remdesivir 100 mg in NS 100 mL 100 mg (12/29/19 0919)   PRN Meds:.albuterol, ondansetron, traMADol  Micro Results Recent Results (from the past 240 hour(s))  Blood Culture (routine x 2)     Status: None (Preliminary result)   Collection Time: 12/26/19  3:46 PM   Specimen: BLOOD LEFT ARM  Result Value Ref Range Status   Specimen Description BLOOD LEFT ARM  Final   Special Requests   Final    BOTTLES DRAWN AEROBIC AND ANAEROBIC Blood Culture adequate volume   Culture   Final    NO GROWTH 3 DAYS Performed at [facility], [address], [place], [place] [address]    Report Status PENDING  Incomplete  Blood Culture (routine x 2)     Status: None (Preliminary result)   Collection Time: 12/26/19  5:13 PM   Specimen: Left Antecubital; Blood  Result Value Ref Range Status   Specimen Description LEFT ANTECUBITAL  Final   Special Requests   Final    BOTTLES DRAWN AEROBIC AND ANAEROBIC Blood Culture adequate volume   Culture   Final    NO GROWTH 3 DAYS Performed at [facility], [address], [place], [place] [address]    Report Status PENDING  Incomplete    Radiology Reports DG Chest Port 1 View  Result Date: 12/26/2019 CLINICAL DATA:  Cough, weakness EXAM: PORTABLE CHEST 1 VIEW COMPARISON:  09/15/2019 FINDINGS: Moderate cardiomegaly, stable. Pulmonary vascular congestion. Diffuse interstitial and alveolar airspace opacities throughout both lungs. No large pleural fluid collection. No pneumothorax. IMPRESSION: Cardiomegaly and pulmonary vascular congestion with diffuse interstitial and alveolar airspace  opacities. Findings are suggestive of CHF with pulmonary edema. A superimposed infectious process would be difficult to exclude. Electronically Signed   By: Davina Poke D.O.   On: 12/26/2019 14:58     Phillips Climes M.D on 12/29/2019 at 1:27 PM  Between 7am to 7pm - Pager - [phone]  After 7pm go to [email or web address] - password [facility]  [facility] -  Office  [phone]

## 2019-12-30 LAB — CBC WITH DIFFERENTIAL/PLATELET
Abs Immature Granulocytes: 0.04 10*3/uL (ref 0.00–0.07)
Basophils Absolute: 0 10*3/uL (ref 0.0–0.1)
Basophils Relative: 0 %
Eosinophils Absolute: 0 10*3/uL (ref 0.0–0.5)
Eosinophils Relative: 0 %
HCT: 37.3 % (ref 36.0–46.0)
Hemoglobin: 12.1 g/dL (ref 12.0–15.0)
Immature Granulocytes: 1 %
Lymphocytes Relative: 17 %
Lymphs Abs: 1.5 10*3/uL (ref 0.7–4.0)
MCH: 28.9 pg (ref 26.0–34.0)
MCHC: 32.4 g/dL (ref 30.0–36.0)
MCV: 89.2 fL (ref 80.0–100.0)
Monocytes Absolute: 0.4 10*3/uL (ref 0.1–1.0)
Monocytes Relative: 5 %
Neutro Abs: 6.5 10*3/uL (ref 1.7–7.7)
Neutrophils Relative %: 77 %
Platelets: 220 10*3/uL (ref 150–400)
RBC: 4.18 MIL/uL (ref 3.87–5.11)
RDW: 13.2 % (ref 11.5–15.5)
WBC: 8.4 10*3/uL (ref 4.0–10.5)
nRBC: 0 % (ref 0.0–0.2)

## 2019-12-30 LAB — BASIC METABOLIC PANEL
Anion gap: 13 (ref 5–15)
BUN: 93 mg/dL — ABNORMAL HIGH (ref 8–23)
CO2: 19 mmol/L — ABNORMAL LOW (ref 22–32)
Calcium: 7.4 mg/dL — ABNORMAL LOW (ref 8.9–10.3)
Chloride: 102 mmol/L (ref 98–111)
Creatinine, Ser: 2.23 mg/dL — ABNORMAL HIGH (ref 0.44–1.00)
GFR calc Af Amer: 27 mL/min — ABNORMAL LOW (ref >60)
GFR calc non Af Amer: 23 mL/min — ABNORMAL LOW (ref >60)
Glucose, Bld: 315 mg/dL — ABNORMAL HIGH (ref 70–99)
Potassium: 5.1 mmol/L (ref 3.5–5.1)
Sodium: 134 mmol/L — ABNORMAL LOW (ref 135–145)

## 2019-12-30 LAB — GLUCOSE, CAPILLARY
Glucose-Capillary: 383 mg/dL — ABNORMAL HIGH (ref 70–99)
Glucose-Capillary: 397 mg/dL — ABNORMAL HIGH (ref 70–99)
Glucose-Capillary: 412 mg/dL — ABNORMAL HIGH (ref 70–99)
Glucose-Capillary: 388 mg/dL — ABNORMAL HIGH (ref 70–99)

## 2019-12-30 LAB — PHOSPHORUS: Phosphorus: 4.7 mg/dL — ABNORMAL HIGH (ref 2.5–4.6)

## 2019-12-30 LAB — C-REACTIVE PROTEIN: CRP: 3.7 mg/dL — ABNORMAL HIGH (ref <1.0)

## 2019-12-30 LAB — MAGNESIUM: Magnesium: 2.4 mg/dL (ref 1.7–2.4)

## 2019-12-30 LAB — D-DIMER, QUANTITATIVE: D-Dimer, Quant: 0.35 ug/mL-FEU (ref 0.00–0.50)

## 2019-12-30 MED ORDER — CARVEDILOL 3.125 MG PO TABS
3.1250 mg | ORAL_TABLET | Freq: Two times a day (BID) | ORAL | Status: DC
  Administered 2019-12-30 – 2019-12-31 (×2): 3.125 mg via ORAL
  Filled 2019-12-30 (×2): qty 1

## 2019-12-30 MED ORDER — INSULIN GLARGINE 100 UNIT/ML ~~LOC~~ SOLN
15.0000 [IU] | Freq: Once | SUBCUTANEOUS | Status: AC
  Administered 2019-12-30: 15:00:00 15 [IU] via SUBCUTANEOUS
  Filled 2019-12-30: qty 0.15

## 2019-12-30 MED ORDER — INSULIN GLARGINE 100 UNIT/ML ~~LOC~~ SOLN
70.0000 [IU] | Freq: Two times a day (BID) | SUBCUTANEOUS | Status: DC
  Administered 2019-12-30 – 2019-12-31 (×2): 70 [IU] via SUBCUTANEOUS
  Filled 2019-12-30 (×3): qty 0.7

## 2019-12-30 NOTE — Plan of Care (Signed)
  Problem: Education: Goal: Knowledge of risk factors and measures for prevention of condition will improve Outcome: Progressing   Problem: Coping: Goal: Psychosocial and spiritual needs will be supported Outcome: Progressing   Problem: Respiratory: Goal: Will maintain a patent airway Outcome: Progressing Goal: Complications related to the disease process, condition or treatment will be avoided or minimized Outcome: Progressing   

## 2019-12-30 NOTE — Progress Notes (Signed)
Inpatient Diabetes Program Recommendations  AACE/ADA: New Consensus Statement on Inpatient Glycemic Control (2015)  Target Ranges:  Prepandial:   less than 140 mg/dL      Peak postprandial:   less than 180 mg/dL (1-2 hours)      Critically ill patients:  140 - 180 mg/dL   Lab Results  Component Value Date   GLUCAP 383 (H) 12/30/2019   HGBA1C 8.9 (H) 12/29/2019    Review of Glycemic Control  Diabetes history: DM 2 Outpatient Diabetes medications: Jardiance 10 mg Daily, Novolog 15 units tid, Tresiba 60 units Daily Current orders for Inpatient glycemic control:  Lantus 55 units bid Novolog 0-20 units tid + hs Novolog 15 units tid  Decadron 6 mg Q24 hours  Inpatient Diabetes Program Recommendations:    Increase Lantus to 65 units bid.  Thanks,  Tama Headings RN, MSN, BC-ADM Inpatient Diabetes Coordinator Team Pager [phone] (8a-5p)

## 2019-12-30 NOTE — Evaluation (Signed)
Physical Therapy Evaluation Patient Details Name: Maureen Jackson MRN: [id number] DOB: July 07, 1956 Today's Date: 12/30/2019   History of Present Illness  Maureen Jackson is a 64 y.o. female with a history of COPD/asthma, coronary artery disease with stents, chronic systolic heart failure with ischemic cardiomyopathy and EF of approximately 35%.  Hypertension, thyroid disease, type 2 diabetes.  Patient seen for worsening shortness of breath over the past 2 to 3 days.  She has possible contacts of her grandson and her daughter who are in and out of her home.  Due to increasing shortness of breath that is worse with exertion, the patient came to emergency department for evaluation.  The patient does not feel that this is consistent with her normal CHF.  Clinical Impression  Cooperative female patient, but requires a great deal of encouragement and reassurance. Flat affect. Generalized weakness, and reduced postural and safety awareness. Lives alone in apt setting with no outside steps. Daughter provides all driving for groceries and appts. Per patient she was independent in all ADLs and IADLs- does have RW. She was able to ambulate <> bathroom from [facility] chair - tending to speed pace, but stable in O2 sats. She should benefit from skilled PT to address goals as outlined for progression to optimal functional outcomes, reduce risk of falls and improve postural and safety awareness  Follow Up Recommendations Home health PT    Equipment Recommendations       Recommendations for Other Services       Precautions / Restrictions Precautions Precautions: Fall Precaution Comments: Tends to be impulsive and speed pace during ambulation Restrictions Weight Bearing Restrictions: No      Mobility  Bed Mobility               General bed mobility comments: Pt seated in bedside chair upon OT arrival.  Transfers Overall transfer level: Needs assistance Equipment used: Rolling walker (2  wheeled) Transfers: Sit to/from [facility] Sit to Stand: Min guard Stand pivot transfers: Min guard       General transfer comment: Noted 0 instances of LOB  Ambulation/Gait Ambulation/Gait assistance: Min guard Gait Distance (Feet): 22 Feet Assistive device: Rolling walker (2 wheeled) Gait Pattern/deviations: Wide base of support;Shuffle   Gait velocity interpretation: 1.31 - 2.62 ft/sec, indicative of limited community Conservation officer, historic buildings Rank[place] (Stroke Patients Only)       Balance Overall balance assessment: Mild deficits observed, not formally tested                                           Pertinent Vitals/Pain Pain Assessment: No/denies pain    Home Living Family/patient expects to be discharged to:: Private residence   Available Help at Discharge: Friend(s);Family Type of Home: Apartment Home Access: Level entry     Home Layout: One level Home Equipment: Cane - single point;Walker - 4 wheels;Shower seat;Grab bars - tub/shower;Hand held shower head;Other (comment) Additional Comments: She states that her daughter lives close, and does work. According to patient she is independent in all ADLs- but daughter does all driving    Prior Function Level of Independence: Independent               Hand Dominance   Dominant Hand: Right    Extremity/Trunk Assessment  Upper Extremity Assessment Upper Extremity Assessment: Defer to OT evaluation    Lower Extremity Assessment Lower Extremity Assessment: Defer to PT evaluation;Generalized weakness    Cervical / Trunk Assessment Cervical / Trunk Assessment: Kyphotic  Communication   Communication: No difficulties  Cognition Arousal/Alertness: Awake/alert Behavior During Therapy: Flat affect Overall Cognitive Status: Within Functional Limits for tasks assessed                                 General  Comments: Required encouragement and reassurance      General Comments      Exercises General Exercises - Lower Extremity Ankle Circles/Pumps: AROM;Seated;Both Short Arc Quad: AROM;Seated;Both Heel Slides: AROM;Seated;Both Hip ABduction/ADduction: AROM;Seated;Both Straight Leg Raises: AROM;Seated;Both Hip Flexion/Marching: AROM;Seated Other Exercises Other Exercises: Incentive spirometer x 10 with min cues on technique. Pulling 650 - needed review of how to efficiently use unit Other Exercises: Flutter valve x 10 with min cues on technique.   Assessment/Plan    PT Assessment Patient needs continued PT services  PT Problem List Decreased strength;Decreased activity tolerance;Decreased mobility;Cardiopulmonary status limiting activity;Decreased coordination       PT Treatment Interventions Gait training;Functional mobility training;Therapeutic activities;Therapeutic exercise;Patient/family education    PT Goals (Current goals can be found in the Care Plan section)  Acute Rehab PT Goals Patient Stated Goal: to go home PT Goal Formulation: With patient Time For Goal Achievement: 01/14/20 Potential to Achieve Goals: Good    Frequency Min 3X/week   Barriers to discharge        Co-evaluation               AM-PAC PT "6 Clicks" Mobility  Outcome Measure Help needed turning from your back to your side while in a flat bed without using bedrails?: A Little Help needed moving from lying on your back to sitting on the side of a flat bed without using bedrails?: None Help needed moving to and from a bed to a chair (including a wheelchair)?: A Little Help needed standing up from a chair using your arms (e.g., wheelchair or bedside chair)?: A Little Help needed to walk in hospital room?: A Little Help needed climbing 3-5 steps with a railing? : A Lot 6 Click Score: 18    End of Session   Activity Tolerance: Patient tolerated treatment well Patient left: in chair;with call  bell/phone within reach Nurse Communication: Mobility status PT Visit Diagnosis: Muscle weakness (generalized) (M62.81)    Time: 1062-6948 PT Time Calculation (min) (ACUTE ONLY): 50 min   Charges:   PT Evaluation $PT Eval Moderate Complexity: 1 Mod PT Treatments $Gait Training: 8-22 mins $Therapeutic Exercise: 8-22 mins        Rollen Sox, PT # [phone] CGV cell }  Casandra Doffing 12/30/2019, 11:28 AM

## 2019-12-30 NOTE — Consult Note (Signed)
   [facility] CM Inpatient Consult   12/30/2019  [name] Mar 21, 1956 [id number]   Patient screened for extreme high risk score for unplanned readmission score of 30% wiith 2  Hospitalizations and 3 ED visits in the past 6 months. Patient is in the [employer].  Review of patient's medical record from MD notes from the brief narrative today that includes but not limited to  reveals patient is:  64 y.o.femalewith a history of COPD/asthma, coronary artery disease with stents, chronic systolic heart failure with ischemic cardiomyopathy and EF of approximately 35%. Hypertension, thyroid disease, type 2 diabetes. Patient seen for worsening shortness of breath over the past 2 to 3 days.  Work-up significant for Covid positive. Possible pulmonary vascular congestion with possible superimposed infectious process. Patient febrile.   Primary Care Provider is: Stoney Bang, MD [facility]  Pharmacy is: [facility]  Transportation to provider: patient was driving prior to admission  Plan:  Continue to follow progress and disposition to assess for post hospital care management needs. Therapy notes are currently for home with home health. Will follow.  Please place a [facility] Care Management consult as appropriate and for questions contact:   Natividad Brood, RN BSN [facility] Liaison  [phone] business mobile phone Toll free office [phone]  Fax number: [phone] [email or web address]

## 2019-12-30 NOTE — Progress Notes (Signed)
PROGRESS NOTE                                                                                                                                                                                                             Patient Demographics:    Maureen Jackson, is a 64 y.o. female, DOB - Apr 22, 1956, OBS:[id number]  Admit date - 12/26/2019   Admitting Physician No admitting provider for patient encounter.  Outpatient Primary MD for the patient is Neale Burly, MD  LOS - 4   Chief Complaint  Patient presents with  . Weakness       Brief Narrative    64 y.o. female with a history of COPD/asthma, coronary artery disease with stents, chronic systolic heart failure with ischemic cardiomyopathy and EF of approximately 35%.  Hypertension, thyroid disease, type 2 diabetes.  Patient seen for worsening shortness of breath over the past 2 to 3 days.  Work-up significant for Covid positive.  Possible pulmonary vascular congestion with possible superimposed infectious process.  Patient febrile.  CRP elevated to 10.5.  Lactic acid normal.  Procalcitonin 0.15.  LDH 255 normal BNP.    Subjective:    Maureen Jackson today reports had some upset stomach after taking Mucinex yesterday, reports his phlegm has improved, still reports cough, dyspnea currently mainly on exertion.   Assessment  & Plan :    Principal Problem:   Acute respiratory disease due to COVID-19 virus Active Problems:   Hypothyroidism   Essential hypertension   GERD   Morbid obesity/BMI > 55   Diabetes mellitus type 2 with complications, uncontrolled ([place])   Ischemic cardiomyopathy   Chronic combined systolic and diastolic CHF (congestive heart failure) (EF 30 to 35 %)  Acute hypoxic respiratory failure due to COVID-19 pneumonia -Chest x-ray significant for multiple opacities, she is on room air today -Continue with  steroids. -Treated  with IV remdesivir -She was encouraged to use incentive  spirometry, flutter valve, out of bed to chair and modified proning. -Continue to trend inflammatory markers closely especially with CRP more than 10 on admission, D-dimers has normalized, CRP trending down which is reassuring.Marland Kitchen  COVID-19 Labs  Recent Labs    12/28/19 0150 12/29/19 0216 12/29/19 0217 12/30/19 0401  DDIMER 0.46 0.33  --  0.35  CRP 7.4*  --  5.2* 3.7*  Lab Results  Component Value Date   SARSCOV2NAA NEGATIVE 09/29/2019   [place] NEGATIVE 09/25/2019   [place] NEGATIVE 09/08/2019   Acute on chronic systolic/diastolic CHF. -Chest x-ray significant for vascular congestion/questionable pulmonary edema.  Respiratory status improved initially with IV diuresis, IV Lasix has been on hold given soft blood pressure and worsening renal function. -He has been stopped given soft blood pressure and bradycardia, I would resume back at low-dose 3.125 mg p.o. twice daily. -We will need to continue to hold Entresto and Aldactone even on discharge given her AKI, and soft blood pressure   AKI on CKD stage III -Creatinine has been trending up, , I have stopped Entresto, Imdur and Coreg . -Did improve with gentle hydration.  Type 2 diabetes mellitus - A1C 8.9 - hold Jardiance,  -Have stopped Tradjenta given her CHF -Remains poorly controlled, I have increased her insulin 200 units, and change sliding scale to resistant .  Ischemic cardiomyopathy -Continue with Plavix -See discussion below regarding diuresis, and medications.  Hypertension -Off medication to avoid soft blood pressure  GERD -Continue with PPI  Hypothyroidism -Continue with Synthroid.   Code Status : Full  Disposition Plan  : Home  Consults  :  None  Procedures  : None  DVT Prophylaxis  :  [place] lovenox   Lab Results  Component Value Date   PLT 220 12/30/2019    Antibiotics  :   Anti-infectives (From admission, onward)   Start     Dose/Rate Route Frequency Ordered Stop   12/27/19 1000   remdesivir 100 mg in sodium chloride 0.9 % 100 mL IVPB     100 mg 200 mL/hr over 30 Minutes Intravenous Daily 12/26/19 2023 12/30/19 1316   12/26/19 2100  remdesivir 200 mg in sodium chloride 0.9% 250 mL IVPB     200 mg 580 mL/hr over 30 Minutes Intravenous Once 12/26/19 2023 12/26/19 2315        Objective:   Vitals:   12/29/19 1915 12/30/19 0515 12/30/19 0832 12/30/19 1120  BP:  128/81 125/70   Pulse:  62 65   Resp:  15 20   Temp: 98 F (36.7 C) 98.1 F (36.7 C) 98.1 F (36.7 C)   TempSrc: Oral Oral Oral   SpO2:  96% (!) 88% 93%  Weight:      Height:        Wt Readings from Last 3 Encounters:  12/26/19 (!) 152 kg  10/02/19 (!) 156.2 kg  09/06/19 (!) 152 kg     Intake/Output Summary (Last 24 hours) at 12/30/2019 1407 Last data filed at 12/29/2019 1800 Gross per 24 hour  Intake 369.93 ml  Output --  Net 369.93 ml     Physical Exam  Awake Alert, Oriented X 3, No new F.N deficits, Normal affect Symmetrical Chest wall movement, Good air movement bilaterally, CTAB RRR,No Gallops,Rubs or new Murmurs, No Parasternal Heave +ve B.Sounds, Abd Soft, No tenderness, No rebound - guarding or rigidity. No Cyanosis, Clubbing or edema, No new Rash or bruise       Data Review:    CBC Recent Labs  Lab 12/26/19 1543 12/27/19 0212 12/28/19 0150 12/29/19 0216 12/30/19 0401  WBC 5.2 3.4* 6.0 8.7 8.4  HGB 10.9* 11.1* 11.2* 12.0 12.1  HCT 34.3* 34.7* 34.8* 36.2 37.3  PLT 159 157 201 210 220  MCV 91.5 89.7 89.0 87.2 89.2  MCH 29.1 28.7 28.6 28.9 28.9  MCHC 31.8 32.0 32.2 33.1 32.4  RDW 12.9 13.0 13.2 13.2  13.2  LYMPHSABS 1.8 1.3 1.4 1.3 1.5  MONOABS 0.4 0.1 0.2 0.3 0.4  EOSABS 0.0 0.0 0.0 0.0 0.0  BASOSABS 0.0 0.0 0.0 0.0 0.0    Chemistries  Recent Labs  Lab 12/26/19 1500 12/27/19 0212 12/28/19 0150 12/28/19 0800 12/29/19 0216 12/30/19 0401  NA 138  --   --  138 135 134*  K 4.0  --   --  4.7 4.8 5.1  CL 106  --   --  104 104 102  CO2 22  --   --  21* 20*  19*  GLUCOSE 180*  --   --  188* 254* 315*  BUN 28*  --   --  63* 82* 93*  CREATININE 1.75*  --   --  2.41* 2.67* 2.23*  CALCIUM 8.0*  --   --  8.0* 7.8* 7.4*  MG  --  2.0 2.3  --  2.3 2.4  AST 33  --   --  24 27  --   ALT 38  --   --  30 31  --   ALKPHOS 320*  --   --  263* 237*  --   BILITOT 1.1  --   --  0.5 0.8  --    ------------------------------------------------------------------------------------------------------------------ No results for input(s): CHOL, HDL, LDLCALC, TRIG, CHOLHDL, LDLDIRECT in the last 72 hours.  Lab Results  Component Value Date   HGBA1C 8.9 (H) 12/29/2019   ------------------------------------------------------------------------------------------------------------------ No results for input(s): TSH, T4TOTAL, T3FREE, THYROIDAB in the last 72 hours.  Invalid input(s): FREET3 ------------------------------------------------------------------------------------------------------------------ No results for input(s): VITAMINB12, FOLATE, FERRITIN, TIBC, IRON, RETICCTPCT in the last 72 hours.  Coagulation profile No results for input(s): INR, PROTIME in the last 168 hours.  Recent Labs    12/29/19 0216 12/30/19 0401  DDIMER 0.33 0.35    Cardiac Enzymes No results for input(s): CKMB, TROPONINI, MYOGLOBIN in the last 168 hours.  Invalid input(s): CK ------------------------------------------------------------------------------------------------------------------    Component Value Date/Time   BNP 57.5 12/28/2019 0150    Inpatient Medications  Scheduled Meds: . vitamin C  500 mg Oral Daily  . aspirin EC  81 mg Oral Daily  . atorvastatin  80 mg Oral q1800  . carvedilol  3.125 mg Oral BID WC  . clopidogrel  75 mg Oral Daily  . dexamethasone  6 mg Oral Q24H  . doxepin  10 mg Oral QHS  . enoxaparin (LOVENOX) injection  75 mg Subcutaneous Q24H  . guaiFENesin  1,200 mg Oral BID  . insulin aspart  0-20 Units Subcutaneous TID WC  . insulin aspart   0-5 Units Subcutaneous QHS  . insulin aspart  15 Units Subcutaneous TID WC  . insulin glargine  15 Units Subcutaneous Once  . insulin glargine  70 Units Subcutaneous Q12H  . levothyroxine  125 mcg Oral Q0600  . nystatin   Topical Q8H  . pantoprazole  40 mg Oral Daily  . timolol  1 drop Both Eyes BID  . zinc sulfate  220 mg Oral Daily   Continuous Infusions:  PRN Meds:.albuterol, ondansetron, traMADol  Micro Results Recent Results (from the past 240 hour(s))  Blood Culture (routine x 2)     Status: None (Preliminary result)   Collection Time: 12/26/19  3:46 PM   Specimen: BLOOD RIGHT ARM  Result Value Ref Range Status   Specimen Description BLOOD RIGHT ARM  Final   Special Requests   Final    BOTTLES DRAWN AEROBIC AND ANAEROBIC Blood Culture adequate volume  Culture   Final    NO GROWTH 4 DAYS Performed at [facility], [address] [place], [place] [address]    Report Status PENDING  Incomplete  Blood Culture (routine x 2)     Status: None (Preliminary result)   Collection Time: 12/26/19  5:13 PM   Specimen: Left Antecubital; Blood  Result Value Ref Range Status   Specimen Description LEFT ANTECUBITAL  Final   Special Requests   Final    BOTTLES DRAWN AEROBIC AND ANAEROBIC Blood Culture adequate volume   Culture   Final    NO GROWTH 4 DAYS Performed at [facility] [address] [place], [place] [address]    Report Status PENDING  Incomplete    Radiology Reports DG Chest Port 1 View  Result Date: 12/26/2019 CLINICAL DATA:  Cough, weakness EXAM: PORTABLE CHEST 1 VIEW COMPARISON:  09/15/2019 FINDINGS: Moderate cardiomegaly, stable. Pulmonary vascular congestion. Diffuse interstitial and alveolar airspace opacities throughout both lungs. No large pleural fluid collection. No pneumothorax. IMPRESSION: Cardiomegaly and pulmonary vascular congestion with diffuse interstitial and alveolar airspace opacities. Findings are suggestive of CHF with pulmonary edema. A  superimposed infectious process would be difficult to exclude. Electronically Signed   By: Davina Poke D.O.   On: 12/26/2019 14:58     Phillips Climes M.D on 12/30/2019 at 2:07 PM  Between 7am to 7pm - Pager - [phone]  After 7pm go to [email or web address] - password [email or web address]  [facility] -  Office  [phone]

## 2019-12-31 ENCOUNTER — Other Ambulatory Visit: Payer: Self-pay

## 2019-12-31 DIAGNOSIS — U071 COVID-19: Secondary | ICD-10-CM

## 2019-12-31 LAB — CBC WITH DIFFERENTIAL/PLATELET
Abs Immature Granulocytes: 0.03 10*3/uL (ref 0.00–0.07)
Basophils Absolute: 0 10*3/uL (ref 0.0–0.1)
Basophils Relative: 0 %
Eosinophils Absolute: 0 10*3/uL (ref 0.0–0.5)
Eosinophils Relative: 0 %
HCT: 37.3 % (ref 36.0–46.0)
Hemoglobin: 11.9 g/dL — ABNORMAL LOW (ref 12.0–15.0)
Immature Granulocytes: 1 %
Lymphocytes Relative: 15 %
Lymphs Abs: 0.9 10*3/uL (ref 0.7–4.0)
MCH: 28.3 pg (ref 26.0–34.0)
MCHC: 31.9 g/dL (ref 30.0–36.0)
MCV: 88.8 fL (ref 80.0–100.0)
Monocytes Absolute: 0.2 10*3/uL (ref 0.1–1.0)
Monocytes Relative: 4 %
Neutro Abs: 5.1 10*3/uL (ref 1.7–7.7)
Neutrophils Relative %: 80 %
Platelets: 224 10*3/uL (ref 150–400)
RBC: 4.2 MIL/uL (ref 3.87–5.11)
RDW: 13.2 % (ref 11.5–15.5)
WBC: 6.3 10*3/uL (ref 4.0–10.5)
nRBC: 0 % (ref 0.0–0.2)

## 2019-12-31 LAB — COMPREHENSIVE METABOLIC PANEL
ALT: 27 U/L (ref 0–44)
AST: 16 U/L (ref 15–41)
Albumin: 3.1 g/dL — ABNORMAL LOW (ref 3.5–5.0)
Alkaline Phosphatase: 212 U/L — ABNORMAL HIGH (ref 38–126)
Anion gap: 9 (ref 5–15)
BUN: 97 mg/dL — ABNORMAL HIGH (ref 8–23)
CO2: 18 mmol/L — ABNORMAL LOW (ref 22–32)
Calcium: 7.8 mg/dL — ABNORMAL LOW (ref 8.9–10.3)
Chloride: 105 mmol/L (ref 98–111)
Creatinine, Ser: 1.81 mg/dL — ABNORMAL HIGH (ref 0.44–1.00)
GFR calc Af Amer: 34 mL/min — ABNORMAL LOW (ref >60)
GFR calc non Af Amer: 30 mL/min — ABNORMAL LOW (ref >60)
Glucose, Bld: 436 mg/dL — ABNORMAL HIGH (ref 70–99)
Potassium: 5.1 mmol/L (ref 3.5–5.1)
Sodium: 132 mmol/L — ABNORMAL LOW (ref 135–145)
Total Bilirubin: 0.5 mg/dL (ref 0.3–1.2)
Total Protein: 7.4 g/dL (ref 6.5–8.1)

## 2019-12-31 LAB — GLUCOSE, CAPILLARY: Glucose-Capillary: 336 mg/dL — ABNORMAL HIGH (ref 70–99)

## 2019-12-31 LAB — CULTURE, BLOOD (ROUTINE X 2)
Culture: NO GROWTH
Culture: NO GROWTH
Special Requests: ADEQUATE
Special Requests: ADEQUATE

## 2019-12-31 LAB — PHOSPHORUS: Phosphorus: 4 mg/dL (ref 2.5–4.6)

## 2019-12-31 LAB — MAGNESIUM: Magnesium: 2.6 mg/dL — ABNORMAL HIGH (ref 1.7–2.4)

## 2019-12-31 LAB — C-REACTIVE PROTEIN: CRP: 2.8 mg/dL — ABNORMAL HIGH (ref <1.0)

## 2019-12-31 LAB — D-DIMER, QUANTITATIVE: D-Dimer, Quant: 0.28 ug/mL-FEU (ref 0.00–0.50)

## 2019-12-31 MED ORDER — DEXAMETHASONE 6 MG PO TABS: 6.0000 mg | ORAL_TABLET | ORAL | 0 refills | Status: AC

## 2019-12-31 MED ORDER — NOVOLOG FLEXPEN 100 UNIT/ML ~~LOC~~ SOPN: 18.0000 [IU] | PEN_INJECTOR | Freq: Three times a day (TID) | SUBCUTANEOUS | 1 refills | Status: DC

## 2019-12-31 NOTE — Discharge Summary (Signed)
Physician Discharge Summary  JACQUELI PANGALLO YIF:[id number] DOB: 21-Nov-1956 DOA: 12/26/2019  PCP: Neale Burly, MD  Admit date: 12/26/2019 Discharge date: 12/31/2019  Admitted From: Home Disposition: Home  Recommendations for Outpatient Follow-up:  1. Follow up with PCP in 1-2 weeks  Home Health: PT Equipment/Devices: None  Discharge Condition: Stable CODE STATUS: Full code Diet recommendation: Diabetic  HPI: Per admitting MD, Maureen Jackson is a 64 y.o. female with a history of COPD/asthma, coronary artery disease with stents, chronic systolic heart failure with ischemic cardiomyopathy and EF of approximately 35%.  Hypertension, thyroid disease, type 2 diabetes.  Patient seen for worsening shortness of breath over the past 2 to 3 days.  She has possible contacts of her grandson and her daughter who are in and out of her home.  Due to increasing shortness of breath that is worse with exertion, the patient came to emergency department for evaluation.  The patient does not feel that this is consistent with her normal CHF. Emergency Department Course: Covid positive.  Possible pulmonary vascular congestion with possible superimposed infectious process.  Patient febrile.  CRP elevated to 10.5.  Lactic acid normal.  Procalcitonin 0.15.  LDH 255 normal BNP.  Hospital Course / Discharge diagnoses: Principal problem: Acute hypoxic respiratory failure due to COVID-19 pneumonia-patient admitted to the hospital with hypoxia in the setting of COVID-19 pneumonia.  Chest x-ray on admission was significant for multiple opacities.  She was treated with remdesivir, steroids with clinical improvement, she was able to be weaned off to room air.  Inflammatory markers are improving, she is feeling better and will be discharged home in stable condition.  COVID-19 Labs  Recent Labs    12/29/19 0216 12/29/19 0217 12/30/19 0401 12/31/19 0138  DDIMER 0.33  --  0.35 0.28  CRP  --  5.2* 3.7* 2.8*    Active problems: Acute on chronic systolic/diastolic CHF-initially chest x-ray was significant for vascular congestion/questionable pulmonary edema, she received IV Lasix however this has been held due to rising creatinine as well as hypotension.  Given soft blood pressures along with acute kidney injury her Entresto and Aldactone have been held, will also hold on discharge and she was advised to follow-up with PCP in 1 to 2 weeks and if blood pressure/labs are stable at that time these can be resumed.  She appears euvolemic on discharge and she is to resume her home Lasix Acute kidney injury on chronic kidney disease stage IV -baseline creatinine around 2-2.5, currently better than baseline.  Monitor as an outpatient Type 2 diabetes mellitus with hyperglycemia-poorly controlled, A1c 8.9.  Resume home medications and I have increased her mealtime insulin as she tends to be hyperglycemic at least for a few days while on steroids.  This was discussed with the patient. Ischemic cardiomyopathy-no chest pain, continue Plavix and rest of home medications as below Essential hypertension-soft blood pressure in the hospital, as above her Entresto and spironolactone hold on discharge and they may be resumed as an outpatient at the next visit Hypothyroidism-continue Synthroid   Discharge Instructions   Allergies as of 12/31/2019      Reactions   Ampicillin Other (See Comments)   "Allergic," per [facility]   Hydrocodone Other (See Comments)   "Allergic," per [facility]   Aspirin Nausea Only   Unasyn [ampicillin-sulbactam Sodium] Rash, Other (See Comments)   "Allergic," per [facility]      Medication List    STOP taking these medications   Entresto 97-103 MG Generic drug: sacubitril-valsartan  spironolactone 25 MG tablet Commonly known as: ALDACTONE     TAKE these medications   aspirin EC 81 MG tablet Take 81 mg by mouth daily.   atorvastatin 80 MG tablet Commonly known as: LIPITOR TAKE 1 TABLET(80 MG) BY  MOUTH DAILY AT 6 PM What changed: See the new instructions.   carvedilol 6.25 MG tablet Commonly known as: COREG Take 1 tablet (6.25 mg total) by mouth 2 (two) times daily with a meal.   cetirizine 10 MG tablet Commonly known as: ZYRTEC Take 10 mg by mouth daily as needed for allergies.   clopidogrel 75 MG tablet Commonly known as: PLAVIX TAKE 1 TABLET(75 MG) BY MOUTH DAILY WITH BREAKFAST What changed: See the new instructions.   dexamethasone 6 MG tablet Commonly known as: DECADRON Take 1 tablet (6 mg total) by mouth daily for 5 days.   doxepin 10 MG capsule Commonly known as: SINEQUAN Take 10 mg by mouth at bedtime.   furosemide 40 MG tablet Commonly known as: LASIX Take 1 tablet (40 mg total) by mouth daily.   Jardiance 10 MG Tabs tablet Generic drug: empagliflozin Take 10 mg by mouth daily.   levothyroxine 125 MCG tablet Commonly known as: SYNTHROID Take 125 mcg by mouth daily before breakfast.   NovoLOG FlexPen 100 UNIT/ML FlexPen Generic drug: insulin aspart Inject 18 Units into the skin 3 (three) times daily with meals. What changed: how much to take   nystatin powder Generic drug: nystatin Apply topically See admin instructions. Apply to groin area every shift for moisture-associated rash   ondansetron 4 MG disintegrating tablet Commonly known as: Zofran ODT Take 1 tablet (4 mg total) by mouth every 8 (eight) hours as needed. What changed: reasons to take this   timolol 0.5 % ophthalmic solution Commonly known as: TIMOPTIC Place 1 drop into both eyes 2 (two) times daily.   traMADol 50 MG tablet Commonly known as: ULTRAM Take 1 tablet (50 mg total) by mouth every 6 (six) hours as needed for severe pain. What changed: reasons to take this   [place] FlexTouch 100 UNIT/ML Sopn FlexTouch Pen Generic drug: insulin degludec Inject 60 Units into the skin daily.   Ventolin HFA 108 (90 Base) MCG/ACT inhaler Generic drug: albuterol Inhale 1-2 puffs into  the lungs every 6 (six) hours as needed for wheezing or shortness of breath. What changed: how much to take        Consultations:  None   Procedures/Studies:  DG Chest Port 1 View  Result Date: 12/26/2019 CLINICAL DATA:  Cough, weakness EXAM: PORTABLE CHEST 1 VIEW COMPARISON:  09/15/2019 FINDINGS: Moderate cardiomegaly, stable. Pulmonary vascular congestion. Diffuse interstitial and alveolar airspace opacities throughout both lungs. No large pleural fluid collection. No pneumothorax. IMPRESSION: Cardiomegaly and pulmonary vascular congestion with diffuse interstitial and alveolar airspace opacities. Findings are suggestive of CHF with pulmonary edema. A superimposed infectious process would be difficult to exclude. Electronically Signed   By: Davina Poke D.O.   On: 12/26/2019 14:58      Subjective: - no chest pain, shortness of breath, no abdominal pain, nausea or vomiting.   Discharge Exam: BP 127/88 (BP Location: Left Arm)   Pulse 62   Temp 97.7 F (36.5 C) (Oral)   Resp 16   Ht 5\' 5"  (1.651 m)   Wt (!) 152 kg   SpO2 96%   BMI 55.75 kg/m   General: Pt is alert, awake, not in acute distress Cardiovascular: RRR, S1/S2 +, no rubs, no gallops Respiratory:  CTA bilaterally, no wheezing, no rhonchi Abdominal: Soft, NT, ND, bowel sounds + Extremities: no edema, no cyanosis    The results of significant diagnostics from this hospitalization (including imaging, microbiology, ancillary and laboratory) are listed below for reference.     Microbiology: Recent Results (from the past 240 hour(s))  Blood Culture (routine x 2)     Status: None   Collection Time: 12/26/19  3:46 PM   Specimen: BLOOD RIGHT ARM  Result Value Ref Range Status   Specimen Description BLOOD RIGHT ARM  Final   Special Requests   Final    BOTTLES DRAWN AEROBIC AND ANAEROBIC Blood Culture adequate volume   Culture   Final    NO GROWTH 5 DAYS Performed at [facility], [address],  [place], [place] [address]    Report Status 12/31/2019 FINAL  Final  Blood Culture (routine x 2)     Status: None   Collection Time: 12/26/19  5:13 PM   Specimen: Left Antecubital; Blood  Result Value Ref Range Status   Specimen Description LEFT ANTECUBITAL  Final   Special Requests   Final    BOTTLES DRAWN AEROBIC AND ANAEROBIC Blood Culture adequate volume   Culture   Final    NO GROWTH 5 DAYS Performed at [facility], [address], [place], [place] [address]    Report Status 12/31/2019 FINAL  Final     Labs: Basic Metabolic Panel: Recent Labs  Lab 12/26/19 1500 12/27/19 0212 12/28/19 0150 12/28/19 0800 12/29/19 0216 12/30/19 0401 12/31/19 0138  NA 138  --   --  138 135 134* 132*  K 4.0  --   --  4.7 4.8 5.1 5.1  CL 106  --   --  104 104 102 105  CO2 22  --   --  21* 20* 19* 18*  GLUCOSE 180*  --   --  188* 254* 315* 436*  BUN 28*  --   --  63* 82* 93* 97*  CREATININE 1.75*  --   --  2.41* 2.67* 2.23* 1.81*  CALCIUM 8.0*  --   --  8.0* 7.8* 7.4* 7.8*  MG  --  2.0 2.3  --  2.3 2.4 2.6*  PHOS  --  4.8* 5.5*  --  5.7* 4.7* 4.0   Liver Function Tests: Recent Labs  Lab 12/26/19 1500 12/28/19 0800 12/29/19 0216 12/31/19 0138  AST 33 24 27 16   ALT 38 30 31 27   ALKPHOS 320* 263* 237* 212*  BILITOT 1.1 0.5 0.8 0.5  PROT 8.3* 8.1 7.8 7.4  ALBUMIN 3.4* 3.4* 3.0* 3.1*   CBC: Recent Labs  Lab 12/27/19 0212 12/28/19 0150 12/29/19 0216 12/30/19 0401 12/31/19 0138  WBC 3.4* 6.0 8.7 8.4 6.3  NEUTROABS 2.0 4.4 7.0 6.5 5.1  HGB 11.1* 11.2* 12.0 12.1 11.9*  HCT 34.7* 34.8* 36.2 37.3 37.3  MCV 89.7 89.0 87.2 89.2 88.8  PLT 157 201 210 220 224   CBG: Recent Labs  Lab 12/30/19 0821 12/30/19 1241 12/30/19 1525 12/30/19 2058 12/31/19 0734  GLUCAP 383* 397* 412* 388* 336*   Hgb A1c Recent Labs    12/29/19 0800  HGBA1C 8.9*   Lipid Profile No results for input(s): CHOL, HDL, LDLCALC, TRIG, CHOLHDL, LDLDIRECT in the last 72 hours. Thyroid function studies No  results for input(s): TSH, T4TOTAL, T3FREE, THYROIDAB in the last 72 hours.  Invalid input(s): FREET3 Urinalysis    Component Value Date/Time   COLORURINE AMBER (A) 12/26/2019 1709  APPEARANCEUR CLOUDY (A) 12/26/2019 1709   LABSPEC 1.024 12/26/2019 1709   PHURINE 5.0 12/26/2019 1709   GLUCOSEU NEGATIVE 12/26/2019 1709   HGBUR NEGATIVE 12/26/2019 1709   BILIRUBINUR NEGATIVE 12/26/2019 1709   KETONESUR 5 (A) 12/26/2019 1709   PROTEINUR 100 (A) 12/26/2019 1709   UROBILINOGEN 0.2 10/02/2015 2320   NITRITE NEGATIVE 12/26/2019 1709   LEUKOCYTESUR NEGATIVE 12/26/2019 1709    FURTHER DISCHARGE INSTRUCTIONS:   Get Medicines reviewed and adjusted: Please take all your medications with you for your next visit with your Primary MD   Laboratory/radiological data: Please request your Primary MD to go over all hospital tests and procedure/radiological results at the follow up, please ask your Primary MD to get all Hospital records sent to his/her office.   In some cases, they will be blood work, cultures and biopsy results pending at the time of your discharge. Please request that your primary care M.D. goes through all the records of your hospital data and follows up on these results.   Also Note the following: If you experience worsening of your admission symptoms, develop shortness of breath, life threatening emergency, suicidal or homicidal thoughts you must seek medical attention immediately by calling 911 or calling your MD immediately  if symptoms less severe.   You must read complete instructions/literature along with all the possible adverse reactions/side effects for all the Medicines you take and that have been prescribed to you. Take any new Medicines after you have completely understood and accpet all the possible adverse reactions/side effects.    Do not drive when taking Pain medications or sleeping medications (Benzodaizepines)   Do not take more than prescribed Pain, Sleep and  Anxiety Medications. It is not advisable to combine anxiety,sleep and pain medications without talking with your primary care practitioner   Special Instructions: If you have smoked or chewed Tobacco  in the last 2 yrs please stop smoking, stop any regular Alcohol  and or any Recreational drug use.   Wear Seat belts while driving.   Please note: You were cared for by a hospitalist during your hospital stay. Once you are discharged, your primary care physician will handle any further medical issues. Please note that NO REFILLS for any discharge medications will be authorized once you are discharged, as it is imperative that you return to your primary care physician (or establish a relationship with a primary care physician if you do not have one) for your post hospital discharge needs so that they can reassess your need for medications and monitor your lab values.  Time coordinating discharge: 40 minutes  SIGNED:  Marzetta Board, MD, PhD 12/31/2019, 11:04 AM

## 2019-12-31 NOTE — Discharge Instructions (Signed)
COVID-19 Frequently Asked Questions COVID-19 (coronavirus disease) is an infection that is caused by a large family of viruses. Some viruses cause illness in people and others cause illness in animals like camels, cats, and bats. In some cases, the viruses that cause illness in animals can spread to humans. Where did the coronavirus come from? In December 2019, [place] told the [facility]) of several cases of lung disease (human respiratory illness). These cases were linked to an open seafood and livestock market in the city of [place]. The link to the seafood and livestock market suggests that the virus may have spread from animals to humans. However, since that first outbreak in December, the virus has also been shown to spread from person to person. What is the name of the disease and the virus? Disease name Early on, this disease was called novel coronavirus. This is because scientists determined that the disease was caused by a new (novel) respiratory virus. The World Health Organization [facility]) has now named the disease COVID-19, or coronavirus disease. Virus name The virus that causes the disease is called severe acute respiratory syndrome coronavirus 2 (SARS-CoV-2). More information on disease and virus naming World Health Organization [facility]): www.who.int/emergencies/diseases/novel-coronavirus-2019/technical-guidance/naming-the-coronavirus-disease-(covid-2019)-and-the-virus-that-causes-it Who is at risk for complications from coronavirus disease? Some people may be at higher risk for complications from coronavirus disease. This includes older adults and people who have chronic diseases, such as heart disease, diabetes, and lung disease. If you are at higher risk for complications, take these extra precautions:  Stay home as much as possible.  Avoid social gatherings and travel.  Avoid close contact with others. Stay at least 6 ft (2 m) away from others, if  possible.  Wash your hands often with soap and water for at least 20 seconds.  Avoid touching your face, mouth, nose, or eyes.  Keep supplies on hand at home, such as food, medicine, and cleaning supplies.  If you must go out in public, wear a cloth face covering or face mask. Make sure your mask covers your nose and mouth. How does coronavirus disease spread? The virus that causes coronavirus disease spreads easily from person to person (is contagious). You may catch the virus by:  Breathing in droplets from an infected person. Droplets can be spread by a person breathing, speaking, singing, coughing, or sneezing.  Touching something, like a table or a doorknob, that was exposed to the virus (contaminated) and then touching your mouth, nose, or eyes. Can I get the virus from touching surfaces or objects? There is still a lot that we do not know about the virus that causes coronavirus disease. Scientists are basing a lot of information on what they know about similar viruses, such as:  Viruses cannot generally survive on surfaces for long. They need a human body (host) to survive.  It is more likely that the virus is spread by close contact with people who are sick (direct contact), such as through: ? Shaking hands or hugging. ? Breathing in respiratory droplets that travel through the air. Droplets can be spread by a person breathing, speaking, singing, coughing, or sneezing.  It is less likely that the virus is spread when a person touches a surface or object that has the virus on it (indirect contact). The virus may be able to enter the body if the person touches a surface or object and then touches his or her face, eyes, nose, or mouth. Can a person spread the virus without having symptoms of the  disease? It may be possible for the virus to spread before a person has symptoms of the disease, but this is most likely not the main way the virus is spreading. It is more likely for the virus  to spread by being in close contact with people who are sick and breathing in the respiratory droplets spread by a person breathing, speaking, singing, coughing, or sneezing. What are the symptoms of coronavirus disease? Symptoms vary from person to person and can range from mild to severe. Symptoms may include:  Fever or chills.  Cough.  Difficulty breathing or feeling short of breath.  Headaches, body aches, or muscle aches.  Runny or stuffy (congested) nose.  Sore throat.  New loss of taste or smell.  Nausea, vomiting, or diarrhea. These symptoms can appear anywhere from 2 to 14 days after you have been exposed to the virus. Some people may not have any symptoms. If you develop symptoms, call your health care provider. People with severe symptoms may need hospital care. Should I be tested for this virus? Your health care provider will decide whether to test you based on your symptoms, history of exposure, and your risk factors. How does a health care provider test for this virus? Health care providers will collect samples to send for testing. Samples may include:  Taking a swab of fluid from the back of your nose and throat, your nose, or your throat.  Taking fluid from the lungs by having you cough up mucus (sputum) into a sterile cup.  Taking a blood sample. Is there a treatment or vaccine for this virus? Currently, there is no vaccine to prevent coronavirus disease. Also, there are no medicines like antibiotics or antivirals to treat the virus. A person who becomes sick is given supportive care, which means rest and fluids. A person may also relieve his or her symptoms by using over-the-counter medicines that treat sneezing, coughing, and runny nose. These are the same medicines that a person takes for the common cold. If you develop symptoms, call your health care provider. People with severe symptoms may need hospital care. What can I do to protect myself and my family from  this virus?     You can protect yourself and your family by taking the same actions that you would take to prevent the spread of other viruses. Take the following actions:  Wash your hands often with soap and water for at least 20 seconds. If soap and water are not available, use alcohol-based hand sanitizer.  Avoid touching your face, mouth, nose, or eyes.  Cough or sneeze into a tissue, sleeve, or elbow. Do not cough or sneeze into your hand or the air. ? If you cough or sneeze into a tissue, throw it away immediately and wash your hands.  Disinfect objects and surfaces that you frequently touch every day.  Stay away from people who are sick.  Avoid going out in public, follow guidance from your state and local health authorities.  Avoid crowded indoor spaces. Stay at least 6 ft (2 m) away from others.  If you must go out in public, wear a cloth face covering or face mask. Make sure your mask covers your nose and mouth.  Stay home if you are sick, except to get medical care. Call your health care provider before you get medical care. Your health care provider will tell you how long to stay home.  Make sure your vaccines are up to date. Ask your health care provider  what vaccines you need. What should I do if I need to travel? Follow travel recommendations from your local health authority, the CDC, and WHO. Travel information and advice  Centers for Disease Control and Prevention (CDC): BodyEditor.hu  World Health Organization [facility]): ThirdIncome.ca Know the risks and take action to protect your health  You are at higher risk of getting coronavirus disease if you are traveling to areas with an outbreak or if you are exposed to travelers from areas with an outbreak.  Wash your hands often and practice good hygiene to lower the risk of catching or spreading the virus. What should I do  if I am sick? General instructions to stop the spread of infection  Wash your hands often with soap and water for at least 20 seconds. If soap and water are not available, use alcohol-based hand sanitizer.  Cough or sneeze into a tissue, sleeve, or elbow. Do not cough or sneeze into your hand or the air.  If you cough or sneeze into a tissue, throw it away immediately and wash your hands.  Stay home unless you must get medical care. Call your health care provider or local health authority before you get medical care.  Avoid public areas. Do not take public transportation, if possible.  If you can, wear a mask if you must go out of the house or if you are in close contact with someone who is not sick. Make sure your mask covers your nose and mouth. Keep your home clean  Disinfect objects and surfaces that are frequently touched every day. This may include: ? Counters and tables. ? Doorknobs and light switches. ? Sinks and faucets. ? Electronics such as phones, remote controls, keyboards, computers, and tablets.  Wash dishes in hot, soapy water or use a dishwasher. Air-dry your dishes.  Wash laundry in hot water. Prevent infecting other household members  Let healthy household members care for children and pets, if possible. If you have to care for children or pets, wash your hands often and wear a mask.  Sleep in a different bedroom or bed, if possible.  Do not share personal items, such as razors, toothbrushes, deodorant, combs, brushes, towels, and washcloths. Where to find more information Centers for Disease Control and Prevention (CDC)  Information and news updates: https://www.butler-gonzalez.com/ World Health Organization [facility])  Information and news updates: MissExecutive.com.ee  Coronavirus health topic: https://www.castaneda.info/  Questions and answers on COVID-19:  OpportunityDebt.at  Global tracker: who.sprinklr.com American Academy of Pediatrics (AAP)  Information for families: www.healthychildren.org/English/health-issues/conditions/chest-lungs/Pages/2019-Novel-Coronavirus.aspx The coronavirus situation is changing rapidly. Check your local health authority website or the CDC and [facility] websites for updates and news. When should I contact a health care provider?  Contact your health care provider if you have symptoms of an infection, such as fever or cough, and you: ? Have been near anyone who is known to have coronavirus disease. ? Have come into contact with a person who is suspected to have coronavirus disease. ? Have traveled to an area where there is an outbreak of COVID-19. When should I get emergency medical care?  Get help right away by calling your local emergency services (911 in the [place] if you have: ? Trouble breathing. ? Pain or pressure in your chest. ? Confusion. ? Blue-tinged lips and fingernails. ? Difficulty waking from sleep. ? Symptoms that get worse. Let the emergency medical personnel know if you think you have coronavirus disease. Summary  A new respiratory virus is spreading from person to person and  causing COVID-19 (coronavirus disease).  The virus that causes COVID-19 appears to spread easily. It spreads from one person to another through droplets from breathing, speaking, singing, coughing, or sneezing.  Older adults and those with chronic diseases are at higher risk of disease. If you are at higher risk for complications, take extra precautions.  There is currently no vaccine to prevent coronavirus disease. There are no medicines, such as antibiotics or antivirals, to treat the virus.  You can protect yourself and your family by washing your hands often, avoiding touching your face, and covering your coughs and sneezes. This information is not intended to replace advice given to you  by your health care provider. Make sure you discuss any questions you have with your health care provider. Document Revised: 09/26/2019 Document Reviewed: 03/25/2019 Elsevier Patient Education  [place] Under Monitoring Name: Maureen Jackson  Location: [id number] [place] [address]   Infection Prevention Recommendations for Individuals Confirmed to have, or Being Evaluated for, 2019 Novel Coronavirus (COVID-19) Infection Who Receive Care at Home  Individuals who are confirmed to have, or are being evaluated for, COVID-19 should follow the prevention steps below until a healthcare provider or local or state health department says they can return to normal activities.  Stay home except to get medical care You should restrict activities outside your home, except for getting medical care. Do not go to work, school, or public areas, and do not use public transportation or taxis.  Call ahead before visiting your doctor Before your medical appointment, call the healthcare provider and tell them that you have, or are being evaluated for, COVID-19 infection. This will help the healthcare provider's office take steps to keep other people from getting infected. Ask your healthcare provider to call the local or state health department.  Monitor your symptoms Seek prompt medical attention if your illness is worsening (e.g., difficulty breathing). Before going to your medical appointment, call the healthcare provider and tell them that you have, or are being evaluated for, COVID-19 infection. Ask your healthcare provider to call the local or state health department.  Wear a facemask You should wear a facemask that covers your nose and mouth when you are in the same room with other people and when you visit a healthcare provider. People who live with or visit you should also wear a facemask while they are in the same room with you.  Separate yourself from other  people in your home As much as possible, you should stay in a different room from other people in your home. Also, you should use a separate bathroom, if available.  Avoid sharing household items You should not share dishes, drinking glasses, cups, eating utensils, towels, bedding, or other items with other people in your home. After using these items, you should wash them thoroughly with soap and water.  Cover your coughs and sneezes Cover your mouth and nose with a tissue when you cough or sneeze, or you can cough or sneeze into your sleeve. Throw used tissues in a lined trash can, and immediately wash your hands with soap and water for at least 20 seconds or use an alcohol-based hand rub.  Wash your [facility] your hands often and thoroughly with soap and water for at least 20 seconds. You can use an alcohol-based hand sanitizer if soap and water are not available and if your hands are not visibly dirty. Avoid touching your eyes, nose, and mouth with unwashed  hands.   Prevention Steps for Caregivers and Household Members of Individuals Confirmed to have, or Being Evaluated for, COVID-19 Infection Being Cared for in the Home  If you live with, or provide care at home for, a person confirmed to have, or being evaluated for, COVID-19 infection please follow these guidelines to prevent infection:  Follow healthcare provider's instructions Make sure that you understand and can help the patient follow any healthcare provider instructions for all care.  Provide for the patient's basic needs You should help the patient with basic needs in the home and provide support for getting groceries, prescriptions, and other personal needs.  Monitor the patient's symptoms If they are getting sicker, call his or her medical provider and tell them that the patient has, or is being evaluated for, COVID-19 infection. This will help the healthcare provider's office take steps to keep other people from  getting infected. Ask the healthcare provider to call the local or state health department.  Limit the number of people who have contact with the patient  If possible, have only one caregiver for the patient.  Other household members should stay in another home or place of residence. If this is not possible, they should stay  in another room, or be separated from the patient as much as possible. Use a separate bathroom, if available.  Restrict visitors who do not have an essential need to be in the home.  Keep older adults, very young children, and other sick people away from the patient Keep older adults, very young children, and those who have compromised immune systems or chronic health conditions away from the patient. This includes people with chronic heart, lung, or kidney conditions, diabetes, and cancer.  Ensure good ventilation Make sure that shared spaces in the home have good air flow, such as from an air conditioner or an opened window, weather permitting.  Wash your hands often  Wash your hands often and thoroughly with soap and water for at least 20 seconds. You can use an alcohol based hand sanitizer if soap and water are not available and if your hands are not visibly dirty.  Avoid touching your eyes, nose, and mouth with unwashed hands.  Use disposable paper towels to dry your hands. If not available, use dedicated cloth towels and replace them when they become wet.  Wear a facemask and gloves  Wear a disposable facemask at all times in the room and gloves when you touch or have contact with the patient's blood, body fluids, and/or secretions or excretions, such as sweat, saliva, sputum, nasal mucus, vomit, urine, or feces.  Ensure the mask fits over your nose and mouth tightly, and do not touch it during use.  Throw out disposable facemasks and gloves after using them. Do not reuse.  Wash your hands immediately after removing your facemask and gloves.  If your  personal clothing becomes contaminated, carefully remove clothing and launder. Wash your hands after handling contaminated clothing.  Place all used disposable facemasks, gloves, and other waste in a lined container before disposing them with other household waste.  Remove gloves and wash your hands immediately after handling these items.  Do not share dishes, glasses, or other household items with the patient  Avoid sharing household items. You should not share dishes, drinking glasses, cups, eating utensils, towels, bedding, or other items with a patient who is confirmed to have, or being evaluated for, COVID-19 infection.  After the person uses these items, you should wash them thoroughly  with soap and water.  Wash laundry thoroughly  Immediately remove and wash clothes or bedding that have blood, body fluids, and/or secretions or excretions, such as sweat, saliva, sputum, nasal mucus, vomit, urine, or feces, on them.  Wear gloves when handling laundry from the patient.  Read and follow directions on labels of laundry or clothing items and detergent. In general, wash and dry with the warmest temperatures recommended on the label.  Clean all areas the individual has used often  Clean all touchable surfaces, such as counters, tabletops, doorknobs, bathroom fixtures, toilets, phones, keyboards, tablets, and bedside tables, every day. Also, clean any surfaces that may have blood, body fluids, and/or secretions or excretions on them.  Wear gloves when cleaning surfaces the patient has come in contact with.  Use a diluted bleach solution (e.g., dilute bleach with 1 part bleach and 10 parts water) or a household disinfectant with a label that says EPA-registered for coronaviruses. To make a bleach solution at home, add 1 tablespoon of bleach to 1 quart (4 cups) of water. For a larger supply, add  cup of bleach to 1 gallon (16 cups) of water.  Read labels of cleaning products and follow  recommendations provided on product labels. Labels contain instructions for safe and effective use of the cleaning product including precautions you should take when applying the product, such as wearing gloves or eye protection and making sure you have good ventilation during use of the product.  Remove gloves and wash hands immediately after cleaning.  Monitor yourself for signs and symptoms of illness Caregivers and household members are considered close contacts, should monitor their health, and will be asked to limit movement outside of the home to the extent possible. Follow the monitoring steps for close contacts listed on the symptom monitoring form.   ? If you have additional questions, contact your local health department or call the epidemiologist on call at [phone] (available 24/7). ? This guidance is subject to change. For the most up-to-date guidance from [facility], please refer to their website: YouBlogs.pl

## 2020-01-01 ENCOUNTER — Other Ambulatory Visit: Payer: Self-pay | Admitting: *Deleted

## 2020-01-01 ENCOUNTER — Encounter: Payer: Self-pay | Admitting: *Deleted

## 2020-01-01 NOTE — Patient Outreach (Signed)
[place] [facility]) Care Management  01/01/2020  Maureen Jackson 1956-04-06 [id number]   Referral Date: 1/20 Referral Source: hospital liaison Date of Admission: 1/15 Diagnosis: Covid 19 Date of Discharge: 1/20 Facility: [place]: [facility] attempt #1, successful.   Social: Lives alone but report she is independent in ADL/IADL's and able to care for herself.  Does report she has a daughter and sister that are in the area if she need help.  State she is feeling much better but is still weak.  Has intermittent shortness of breath and cough but report it has improved.  Conditions: Per chart, has history of CHF, HTN, CAD, TIA, GERD, Hypothyroidsim, Diabetes (A1C - 8.9 on 1/18, improved from 11.2 last January), and hyperlipidemia.  She does have scale and glucose meter but only report monitoring every other day. Last glucose was 122, unsure what her last weight was.  Advised to check daily in order to manage chronic conditions.  Medications: Report taking as prescribed, including newly prescribed medications. Denies the need for financial assistance as she has Medicaid. Report understanding of stopping Entresto and Aldactone until her follow up appointment.  Appointments: Denies having appointment with PCP scheduled yet but has follow upwith cardiology on 2/17. Advised per discharge instructions to follow up with PCP within 2 weeks. Also noted that inpatient PT recommended home health PT but no order placed.  Call was placed to MD office to request order for PT however notified that member would have to be evaluated with office visit prior to this order. Office aware that member would be calling to schedule appointment.  She uses Medicaid transportation for appointments.   Denies any urgent concerns at this time, provided with this care manager's contact information.  Advised to contact with questions.  Plan: RN CM will follow up with member within the  next week.  Fall Risk  01/01/2020  Falls in the past year? 1  Number falls in past yr: 1  Injury with Fall? 1  Risk for fall due to : History of fall(s)   Depression screen [facility] 2/9 01/01/2020  Decreased Interest 0  Down, Depressed, Hopeless 0  PHQ - 2 Score 0     THN CM Care Plan Problem One     Most Recent Value  Care Plan Problem One  Risk for readmission related to Covid complications as evidenced by recent hospitalization  Role Documenting the Problem One  Care Management [place] for Problem One  Active  [facility] Long Term Goal   Member will not be readmitted to hospital within the next 31 days  [facility]N Long Term Goal Start Date  01/01/20  Interventions for Problem One Long Term Goal  Discharge instructions reviewed with member.  Educated on Covid precautions and isolation.   THN CM Short Term Goal #1   Member will report making follow up appointment with PCP within the next 2 weeks  THN CM Short Term Goal #1 Start Date  01/01/20  Interventions for Short Term Goal #1  Educated on importance for follow up visit with MD in effort to manage chronic medical conditions.  THN CM Short Term Goal #2   Member will report being active with home health PT within the next 2 weeks  THN CM Short Term Goal #2 Start Date  01/01/20  Interventions for Short Term Goal #2  Call placed to PCP office to request assessment for PT evaluation.      Valente David, Therapist, sports, MSN  [place] [phone]

## 2020-01-05 ENCOUNTER — Other Ambulatory Visit: Payer: Self-pay | Admitting: Student

## 2020-01-07 ENCOUNTER — Other Ambulatory Visit: Payer: Self-pay | Admitting: *Deleted

## 2020-01-07 NOTE — Patient Outreach (Signed)
[place] [facility]) Care Management  01/07/2020  Maureen Jackson 1956/07/30 [id number]   Call placed to member to complete initial assessment as well as weekly transition of care call.  No answer, HIPAA compliant voice message left.  Will follow up within the next 3-4 business days.  Valente David, [place], MSN [place] [phone]

## 2020-01-08 ENCOUNTER — Telehealth: Payer: Self-pay | Admitting: Cardiology

## 2020-01-08 NOTE — Telephone Encounter (Signed)
Pt told to hold entresto and spironolactone until she has VV with Dr,Branch.She will resume  All other meds per discharge summary. She will also record daily BP.

## 2020-01-08 NOTE — Telephone Encounter (Signed)
Pt has questions about how much Coreg she's supposed to be taking and some of the other meds that they stopped on her recent hospital admission

## 2020-01-12 ENCOUNTER — Other Ambulatory Visit: Payer: Self-pay | Admitting: *Deleted

## 2020-01-12 NOTE — Patient Outreach (Signed)
[place] [facility]) Care Management  01/12/2020  Maureen Jackson [birth year] [id number]   Outreach attempt #2, unsuccessful.  Weekly transition of care call attempted, no answer.  HIPAA compliant voice message left, unsuccessful outreach letter sent, will follow up within the next 3-4 business days.  Valente David, [place], MSN [place] [phone]

## 2020-01-15 ENCOUNTER — Other Ambulatory Visit: Payer: Self-pay | Admitting: *Deleted

## 2020-01-15 NOTE — Patient Outreach (Signed)
[place] [facility]) Care Management  01/15/2020  RAUL WINTERHALTER 06-03-1956 [id number]   Outreach attempt #3, unsuccessful.  Call placed to member for weekly transition of care assessment as well as attempt to complete initial assessment, no answer.  HIPAA compliant voice message left.  If no call back by 2/10 will close case due to inability to maintain contact.  Valente David, [place], MSN [place] [phone]

## 2020-01-19 ENCOUNTER — Telehealth: Payer: Self-pay | Admitting: Cardiology

## 2020-01-19 NOTE — Telephone Encounter (Signed)
Patient calling asking when she is to start back her medication that was stopped while she was in the hospital

## 2020-01-20 ENCOUNTER — Telehealth: Payer: Self-pay

## 2020-01-20 NOTE — Telephone Encounter (Signed)
Returned pt call. NA. LMTCB.

## 2020-01-20 NOTE — Telephone Encounter (Signed)
[id number] 

## 2020-01-20 NOTE — Telephone Encounter (Signed)
P has apt with Dr.Branch next week.I will forward to him.patient taken off Entresto

## 2020-01-20 NOTE — Telephone Encounter (Signed)
Pt will monitor BP and report during the next office visit with Dr. Harl Bowie on 2/17.

## 2020-01-20 NOTE — Telephone Encounter (Signed)
I spoke with patient,she will discuss meds at VV next week

## 2020-01-20 NOTE — Telephone Encounter (Signed)
Rec'd call from Pt stating she was Dx with COVID admitted to [facility]. Her entresto was stopped along with some other meds. Please advise when she will re-start taking meds as prescribed by Dr. Harl Bowie.  Please call [phone] Thanks renee

## 2020-01-20 NOTE — Telephone Encounter (Signed)
I would stick with the changes made in the hospital until our f/u and we will reevalaute then   Zandra Abts MD

## 2020-01-21 ENCOUNTER — Other Ambulatory Visit: Payer: Self-pay | Admitting: *Deleted

## 2020-01-21 NOTE — Patient Outreach (Signed)
[place] [facility]) Care Management  01/21/2020  Maureen Jackson [birth year] [id number]   No response from member after multiple unsuccessful outreach attempts and letter sent.  Will close case at this time due to inability to maintain contact.  Will notify member and primary MD of case closure.   Valente David, [place], MSN [place] [phone]

## 2020-01-28 ENCOUNTER — Telehealth (INDEPENDENT_AMBULATORY_CARE_PROVIDER_SITE_OTHER): Payer: Medicare HMO | Admitting: Cardiology

## 2020-01-28 ENCOUNTER — Encounter: Payer: Self-pay | Admitting: Cardiology

## 2020-01-28 VITALS — BP 120/74 | Ht 65.0 in | Wt 335.0 lb

## 2020-01-28 DIAGNOSIS — I5022 Chronic systolic (congestive) heart failure: Secondary | ICD-10-CM | POA: Diagnosis not present

## 2020-01-28 MED ORDER — ENTRESTO 24-26 MG PO TABS: 1.0000 | ORAL_TABLET | Freq: Two times a day (BID) | ORAL | 3 refills | Status: DC

## 2020-01-28 NOTE — Addendum Note (Signed)
Addended by: Debbora Lacrosse R on: 01/28/2020 10:43 AM   Modules accepted: Orders

## 2020-01-28 NOTE — Patient Instructions (Signed)
Medication Instructions:  START ENTRESTO 24/26 MG - TWO TIMES DAILY   Labwork: NONE  Testing/Procedures: NONE  Follow-Up: Your physician recommends that you schedule a follow-up appointment in: 1 MONTH    Any Other Special Instructions Will Be Listed Below (If Applicable).     If you need a refill on your cardiac medications before your next appointment, please call your pharmacy.

## 2020-01-28 NOTE — Progress Notes (Signed)
Virtual Visit via Telephone Note   This visit type was conducted due to national recommendations for restrictions regarding the COVID-19 Pandemic (e.g. social distancing) in an effort to limit this patient's exposure and mitigate transmission in our community.  Due to her co-morbid illnesses, this patient is at least at moderate risk for complications without adequate follow up.  This format is felt to be most appropriate for this patient at this time.  The patient did not have access to video technology/had technical difficulties with video requiring transitioning to audio format only (telephone).  All issues noted in this document were discussed and addressed.  No physical exam could be performed with this format.  Please refer to the patient's chart for her  consent to telehealth for [facility].   Date:  01/28/2020   ID:  Maureen Jackson, DOB Oct 30, 1956, MRN [id number]  Patient Location: Home Provider Location: Office  PCP:  Neale Burly, MD  Cardiologist:  Carlyle Dolly, MD  Electrophysiologist:  None   Evaluation Performed:  Follow-Up Visit  Chief Complaint:  Follow up  History of Present Illness:    Maureen Jackson is a 64 y.o. female seen today for follow up of the following medical problems.    1. Chronic systolic HF/ICM/CAD - patient admitted with acute onset CHF 08/2017.  - echo 08/2017 LVEF 30-35%, grade I diastoilc dysfunction - 09/2017 cath as reported below, found to have severe multivessel disease of LAD, LCX, and RCA. Mean PA 33, PCWP 25, CI 2.7 - seen by CT surgery, recs were for PCI as opposed to CABG. -  Received DES to LAD and DES x 2 to RCA, LCX disease managed medically. Recs for lifelong plavix per interventional cards. Has been on high dose ASA per neurology. -discharge weight 09/13/17 312 lbs  08/2018 echo limited visually, LVEF 30-40% Jan 2020 echo: LVEF 35-40%, though difficult study even with contrast  - weight up from 326 in 11/2018 to  343 lbs.  - she reports increased appetite. No recent SOB/DOE, no orthopnea - compliant with meds  - recent admission Jan 2021 with covid pneumonia - admission complicated by acute on chronic systolic HF, AKI, hypotension - entresto and aldactone held. Discharge Cr improved to 1.8,baseline is around 2 - no SOB/DOE. No recent edema - compliant with meds   2. Recent admission for covid pneumonia - discharged Jan 20,2021     The patient does not have symptoms concerning for COVID-19 infection (fever, chills, cough, or new shortness of breath).    Past Medical History:  Diagnosis Date  . 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 Bleeding from mouth    when she brushed teeth or ate  . Chronic systolic CHF (congestive heart failure) ([place])   . Coronary artery disease 09/2017   a. multivessel CAD by cath in 09/2017 and not felt to be a CABG candidate --> underwent two-vessel PCI with DES to the LAD and DES to the RCA  . Diabetes mellitus   . Dyspnea   . Glaucoma   . Hypertension   . Left bundle Henley Boettner block   . Morbid obesity ([place])   . Obstructive sleep apnea    does not wear CPAP  . Thyroid disease    Past Surgical History:  Procedure Laterality Date  . ABDOMINAL HYSTERECTOMY    . CARDIAC CATHETERIZATION  09/12/2017  . CORONARY STENT INTERVENTION  09/12/2017   STENT RESOLUTE ONYX 8.52D78 drug eluting stent was successfully placed  .  CORONARY STENT INTERVENTION N/A 09/12/2017   Procedure: CORONARY STENT INTERVENTION;  Surgeon: Leonie Man, MD;  Location: [place] CV [facility];  Service: Cardiovascular;  Laterality: N/A;  . INCISION AND DRAINAGE PERIRECTAL ABSCESS Left 09/16/2019   Procedure: IRRIGATION AND DEBRIDEMENT LABIA ABSCESS;  Surgeon: Coralie Keens, MD;  Location: [place];  Service: General;  Laterality: Left;  . IR FLUORO GUIDE CV LINE RIGHT  09/16/2019  . IR US GUIDE VASC ACCESS RIGHT  09/16/2019  . LEFT HEART CATH AND CORONARY ANGIOGRAPHY N/A 09/12/2017   Procedure: LEFT  HEART CATH AND CORONARY ANGIOGRAPHY;  Surgeon: Leonie Man, MD;  Location: [place] CV LAB;  Service: Cardiovascular;  Laterality: N/A;  . MASS EXCISION N/A 12/18/2018   Procedure: EXCISION TONGUE MASS;  Surgeon: Leta Baptist, MD;  Location: [place] OR;  Service: ENT;  Laterality: N/A;  . RIGHT/LEFT HEART CATH AND CORONARY ANGIOGRAPHY N/A 09/10/2017   Procedure: RIGHT/LEFT HEART CATH AND CORONARY ANGIOGRAPHY;  Surgeon: Troy Sine, MD;  Location: [place] CV LAB;  Service: Cardiovascular;  Laterality: N/A;  . THYROID SURGERY       No outpatient medications have been marked as taking for the 01/28/20 encounter (Appointment) with Arnoldo Lenis, MD.     Allergies:   Ampicillin, Hydrocodone, Aspirin, and Unasyn [ampicillin-sulbactam sodium]   Social History   Tobacco Use  . Smoking status: Former Smoker    Quit date: 12/14/2005    Years since quitting: 14.1  . Smokeless tobacco: Never Used  . Tobacco comment: QUIT IN 2005  Substance Use Topics  . Alcohol use: No  . Drug use: No     Family Hx: The patient's family history includes Cancer in her mother; Diabetes in her mother; Hypertension in her mother and sister.  ROS:   Please see the history of present illness.     All other systems reviewed and are negative.   Prior CV studies:   The following studies were reviewed today:   Labs/Other Tests and Data Reviewed:    EKG:  No ECG reviewed.  Recent Labs: 12/28/2019: B Natriuretic Peptide 57.5 12/31/2019: ALT 27; BUN 97; Creatinine, Ser 1.81; Hemoglobin 11.9; Magnesium 2.6; Platelets 224; Potassium 5.1; Sodium 132   Recent Lipid Panel Lab Results  Component Value Date/Time   CHOL 147 07/17/2018 01:52 AM   TRIG 124 12/26/2019 03:46 PM   HDL 31 (L) 07/17/2018 01:52 AM   CHOLHDL 4.7 07/17/2018 01:52 AM   LDLCALC 83 07/17/2018 01:52 AM    Wt Readings from Last 3 Encounters:  12/26/19 (!) 335 lb (152 kg)  10/02/19 (!) 344 lb 4.8 oz (156.2 kg)  09/06/19 (!) 335 lb  (152 kg)     Objective:    Vital Signs:   Today's Vitals   01/28/20 0841  BP: 120/74  Weight: (!) 335 lb (152 kg)  Height: 5\' 5"  (1.651 m)   Body mass index is 55.75 kg/m. Normal affect. Normal speech pattern and tone. Comfortable, no apparent distress. No audible signs of SOB or wheezing.   ASSESSMENT & PLAN:    1. Chronic systolic HF -no significant sytmpoms, weights at her baseline - entersto and aldactone stopped after recent admission with covid due to low bp's -  Normal bp's today, restart low dose entrest 24/26mg  bid and titrate as tolerated, hold aldactone for now until entresto titrated F/u 1 month in clinic  COVID-19 Education: The signs and symptoms of COVID-19 were discussed with the patient and how to seek care for  testing (follow up with PCP or arrange E-visit).  The importance of social distancing was discussed today.  Time:   Today, I have spent 22 minutes with the patient with telehealth technology discussing the above problems.     Medication Adjustments/Labs and Tests Ordered: Current medicines are reviewed at length with the patient today.  Concerns regarding medicines are outlined above.   Tests Ordered: No orders of the defined types were placed in this encounter.   Medication Changes: No orders of the defined types were placed in this encounter.   Follow Up:  In Person in 1 month(s)  Signed, Carlyle Dolly, MD  01/28/2020 8:00 AM    [place]

## 2020-02-03 DIAGNOSIS — R69 Illness, unspecified: Secondary | ICD-10-CM | POA: Diagnosis not present

## 2020-02-20 ENCOUNTER — Telehealth: Payer: Self-pay

## 2020-02-20 NOTE — Telephone Encounter (Signed)

## 2020-02-20 NOTE — Telephone Encounter (Signed)

## 2020-02-25 ENCOUNTER — Ambulatory Visit: Payer: Medicare HMO | Admitting: Student

## 2020-02-26 ENCOUNTER — Telehealth (INDEPENDENT_AMBULATORY_CARE_PROVIDER_SITE_OTHER): Payer: Medicare HMO | Admitting: Student

## 2020-02-26 ENCOUNTER — Encounter: Payer: Self-pay | Admitting: Student

## 2020-02-26 VITALS — BP 144/88 | Ht 65.0 in | Wt 334.0 lb

## 2020-02-26 DIAGNOSIS — I2510 Atherosclerotic heart disease of native coronary artery without angina pectoris: Secondary | ICD-10-CM | POA: Diagnosis not present

## 2020-02-26 DIAGNOSIS — N183 Chronic kidney disease, stage 3 unspecified: Secondary | ICD-10-CM

## 2020-02-26 DIAGNOSIS — Z79899 Other long term (current) drug therapy: Secondary | ICD-10-CM

## 2020-02-26 DIAGNOSIS — E785 Hyperlipidemia, unspecified: Secondary | ICD-10-CM

## 2020-02-26 DIAGNOSIS — I5022 Chronic systolic (congestive) heart failure: Secondary | ICD-10-CM

## 2020-02-26 DIAGNOSIS — I10 Essential (primary) hypertension: Secondary | ICD-10-CM

## 2020-02-26 MED ORDER — ENTRESTO 49-51 MG PO TABS: 1.0000 | ORAL_TABLET | Freq: Two times a day (BID) | ORAL | 11 refills | Status: DC

## 2020-02-26 NOTE — Patient Instructions (Signed)
Medication Instructions:  Your physician has recommended you make the following change in your medication:  Increase Entresto to 49-51mg    *If you need a refill on your cardiac medications before your next appointment, please call your pharmacy*   Lab Work: Your physician recommends that you return for lab work in: 2 Weeks   If you have labs (blood work) drawn today and your tests are completely normal, you will receive your results only by: Marland Kitchen MyChart Message (if you have MyChart) OR . A paper copy in the mail If you have any lab test that is abnormal or we need to change your treatment, we will call you to review the results.   Testing/Procedures: NONE    Follow-Up: At [facility], you and your health needs are our priority.  As part of our continuing mission to provide you with exceptional heart care, we have created designated Provider Care Teams.  These Care Teams include your primary Cardiologist (physician) and Advanced Practice Providers (APPs -  Physician Assistants and Nurse Practitioners) who all work together to provide you with the care you need, when you need it.  We recommend signing up for the patient portal called "MyChart".  Sign up information is provided on this After Visit Summary.  MyChart is used to connect with patients for Virtual Visits (Telemedicine).  Patients are able to view lab/test results, encounter notes, upcoming appointments, etc.  Non-urgent messages can be sent to your provider as well.   To learn more about what you can do with MyChart, go to [email or web address].    Your next appointment:   4-6 week(s)  The format for your next appointment:   In Person  Provider:   You may see Carlyle Dolly, MD or one of the following Advanced Practice Providers on your designated Care Team:    Bernerd Pho, PA-C   Ermalinda Barrios, [place]     Other Instructions Thank you for choosing [place]!

## 2020-02-26 NOTE — Progress Notes (Signed)
Virtual Visit via Telephone Note   This visit type was conducted due to national recommendations for restrictions regarding the COVID-19 Pandemic (e.g. social distancing) in an effort to limit this patient's exposure and mitigate transmission in our community.  Due to her co-morbid illnesses, this patient is at least at moderate risk for complications without adequate follow up.  This format is felt to be most appropriate for this patient at this time.  The patient did not have access to video technology/had technical difficulties with video requiring transitioning to audio format only (telephone).  All issues noted in this document were discussed and addressed.  No physical exam could be performed with this format.  Please refer to the patient's chart for her  consent to telehealth for [facility].   The patient was identified using 2 identifiers.  Date:  02/26/2020   ID:  Maureen Jackson, DOB [birth year], MRN [id number]  Patient Location: Home Provider Location: Office  PCP:  Neale Burly, MD  Cardiologist:  Carlyle Dolly, MD  Electrophysiologist:  None   Evaluation Performed:  Follow-Up Visit  Chief Complaint: 1 month visit  History of Present Illness:    Maureen Jackson is a 64 y.o. female with past medical history of CAD (s/p DES to LAD and RCA in 09/2017), ischemic cardiomyopathy (EF 30-35% in 09/2017, at 35-40% by repeat in 12/2018), HTN, HLD, Type 2 DM, Stage 4 CKD and obesity who presents for a 29-month follow-up telehealth visit.   She was admitted to [facility] in 12/2019 for acute hypoxic respiratory failure in the setting of COVID-19 PNA. She was treated with Remdesivir along with IV steroids. She was treated for acute on chronic CHF exacerbation but developed an AKI and hypotension with diuretic therapy, therefore Aldactone and Entresto were held at discharge.   She had a phone visit with Dr. Harl Bowie on 01/28/2020 and she denied any recurrent dyspnea or orthopnea.  Weight was stable at 335 lbs and BP was normal at 120/74. She was restarted on Entresto 24-26mg  BID with plans for further titration at follow-up and initially restarting Spironolactone if BP allowed and renal function remained stable.   In talking with the patient today, she reports still having dyspnea on exertion and fatigue since her COVID-19 hospitalization in 12/2019 but reports symptoms do continue to improve. She is using her inhaler as needed. She denies any specific orthopnea, PND or lower extremity edema. Weight has been stable on her home scales.   She reports BP was well controlled at 144/88 when checked earlier today but she was stressed the other day due to a family scenario and SBP was elevated in the 180's. She has not had repeat labs since 12/2019.  The patient does not have symptoms concerning for COVID-19 infection (fever, chills, cough, or new shortness of breath).    Past Medical History:  Diagnosis Date  . 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 Bleeding from mouth    when she brushed teeth or ate  . Chronic systolic CHF (congestive heart failure) ([place])   . Coronary artery disease 09/2017   a. multivessel CAD by cath in 09/2017 and not felt to be a CABG candidate --> underwent two-vessel PCI with DES to the LAD and DES to the RCA  . Diabetes mellitus   . Dyspnea   . Glaucoma   . Hypertension   . Left bundle branch block   . Morbid obesity ([place])   . Obstructive sleep apnea  does not wear CPAP  . Thyroid disease    Past Surgical History:  Procedure Laterality Date  . ABDOMINAL HYSTERECTOMY    . CARDIAC CATHETERIZATION  09/12/2017  . CORONARY STENT INTERVENTION  09/12/2017   STENT RESOLUTE ONYX 4.09W11 drug eluting stent was successfully placed  . CORONARY STENT INTERVENTION N/A 09/12/2017   Procedure: CORONARY STENT INTERVENTION;  Surgeon: Leonie Man, MD;  Location: [place] CV LAB;  Service: Cardiovascular;  Laterality: N/A;  . INCISION AND DRAINAGE  PERIRECTAL ABSCESS Left 09/16/2019   Procedure: IRRIGATION AND DEBRIDEMENT LABIA ABSCESS;  Surgeon: Coralie Keens, MD;  Location: [place];  Service: General;  Laterality: Left;  . IR FLUORO GUIDE CV LINE RIGHT  09/16/2019  . IR US GUIDE VASC ACCESS RIGHT  09/16/2019  . LEFT HEART CATH AND CORONARY ANGIOGRAPHY N/A 09/12/2017   Procedure: LEFT HEART CATH AND CORONARY ANGIOGRAPHY;  Surgeon: Leonie Man, MD;  Location: [place] CV LAB;  Service: Cardiovascular;  Laterality: N/A;  . MASS EXCISION N/A 12/18/2018   Procedure: EXCISION TONGUE MASS;  Surgeon: Leta Baptist, MD;  Location: [place] OR;  Service: ENT;  Laterality: N/A;  . RIGHT/LEFT HEART CATH AND CORONARY ANGIOGRAPHY N/A 09/10/2017   Procedure: RIGHT/LEFT HEART CATH AND CORONARY ANGIOGRAPHY;  Surgeon: Troy Sine, MD;  Location: [place] CV LAB;  Service: Cardiovascular;  Laterality: N/A;  . THYROID SURGERY       Current Meds  Medication Sig  . aspirin EC 81 MG tablet Take 81 mg by mouth daily.  Marland Kitchen atorvastatin (LIPITOR) 80 MG tablet TAKE 1 TABLET(80 MG) BY MOUTH DAILY AT 6 PM (Patient taking differently: Take 80 mg by mouth daily at 6 PM. )  . carvedilol (COREG) 6.25 MG tablet Take 1 tablet (6.25 mg total) by mouth 2 (two) times daily with a meal.  . cetirizine (ZYRTEC) 10 MG tablet Take 10 mg by mouth daily as needed for allergies.   Marland Kitchen clopidogrel (PLAVIX) 75 MG tablet TAKE 1 TABLET(75 MG) BY MOUTH DAILY WITH BREAKFAST (Patient taking differently: Take 75 mg by mouth daily. )  . furosemide (LASIX) 40 MG tablet Take 1 tablet (40 mg total) by mouth daily.  Marland Kitchen JARDIANCE 10 MG TABS tablet Take 10 mg by mouth daily.  Marland Kitchen levothyroxine (SYNTHROID, LEVOTHROID) 125 MCG tablet Take 125 mcg by mouth daily before breakfast.   . NOVOLOG FLEXPEN 100 UNIT/ML FlexPen Inject 18 Units into the skin 3 (three) times daily with meals.  . ondansetron (ZOFRAN ODT) 4 MG disintegrating tablet Take 1 tablet (4 mg total) by mouth every 8 (eight) hours as needed.  (Patient taking differently: Take 4 mg by mouth every 8 (eight) hours as needed for nausea or vomiting. )  . timolol (TIMOPTIC) 0.5 % ophthalmic solution Place 1 drop into both eyes 2 (two) times daily.  . traMADol (ULTRAM) 50 MG tablet Take 1 tablet (50 mg total) by mouth every 6 (six) hours as needed for severe pain. (Patient taking differently: Take 50 mg by mouth every 6 (six) hours as needed for moderate pain or severe pain. )  . TRESIBA FLEXTOUCH 100 UNIT/ML SOPN FlexTouch Pen Inject 60 Units into the skin daily.   . VENTOLIN HFA 108 (90 Base) MCG/ACT inhaler Inhale 1-2 puffs into the lungs every 6 (six) hours as needed for wheezing or shortness of breath. (Patient taking differently: Inhale 2 puffs into the lungs every 6 (six) hours as needed for wheezing or shortness of breath. )  . [DISCONTINUED] sacubitril-valsartan (ENTRESTO)  24-26 MG Take 1 tablet by mouth 2 (two) times daily.     Allergies:   Ampicillin, Hydrocodone, Aspirin, and Unasyn [ampicillin-sulbactam sodium]   Social History   Tobacco Use  . Smoking status: Former Smoker    Quit date: 12/14/2005    Years since quitting: 14.2  . Smokeless tobacco: Never Used  . Tobacco comment: QUIT IN 2005  Substance Use Topics  . Alcohol use: No  . Drug use: No     Family Hx: The patient's family history includes Cancer in her mother; Diabetes in her mother; Hypertension in her mother and sister.  ROS:   Please see the history of present illness.     All other systems reviewed and are negative.   Prior CV studies:   The following studies were reviewed today:  Echocardiogram: 12/2018 Study Conclusions   - Limited study to evaluate LV function.  - Technically difficult study, poor visualization. Technical issues  with attempts at echo contrast injections, inadequate contrast  effect on imaging despite multiple doses.  - LVEF appears grossly in the 35-40% range.   Labs/Other Tests and Data Reviewed:    EKG:  An ECG  dated 12/26/2019 was personally reviewed today and demonstrated:  NSR, HR 81 with known LBBB.   Recent Labs: 12/28/2019: B Natriuretic Peptide 57.5 12/31/2019: ALT 27; BUN 97; Creatinine, Ser 1.81; Hemoglobin 11.9; Magnesium 2.6; Platelets 224; Potassium 5.1; Sodium 132   Recent Lipid Panel Lab Results  Component Value Date/Time   CHOL 147 07/17/2018 01:52 AM   TRIG 124 12/26/2019 03:46 PM   HDL 31 (L) 07/17/2018 01:52 AM   CHOLHDL 4.7 07/17/2018 01:52 AM   LDLCALC 83 07/17/2018 01:52 AM    Wt Readings from Last 3 Encounters:  02/26/20 (!) 334 lb (151.5 kg)  01/28/20 (!) 335 lb (152 kg)  12/26/19 (!) 335 lb (152 kg)     Objective:    Vital Signs:  BP (!) 144/88   Ht 5\' 5"  (1.651 m)   Wt (!) 334 lb (151.5 kg)   BMI 55.58 kg/m    General: Pleasant female sounding in NAD Psych: Normal affect. Neuro: Alert and oriented X 3. Lungs:  Resp regular and unlabored while talking on the phone.   ASSESSMENT & PLAN:    1. CAD - she is s/p DES to LAD and RCA in 09/2017.  She continues to have dyspnea on exertion but this has improved. She denies any chest pain. - Continue ASA 81 mg daily, Plavix 75 mg daily, Coreg 6.25 mg twice daily and Atorvastatin 80 mg daily.  2. Chronic Systolic CHF/ Ischemic Cardiomyopathy - EF 30-35% in 09/2017, at 35-40% by repeat in 12/2018. Her Entresto and Spironolactone were stopped during her admission in 12/2019 due to hypotension and AKI. Delene Loll was added back at her last office visit at  24-26 mg twice daily. Will plan to titrate to 49-51mg  BID (was on 97-103mg  BID prior to admission). Repeat BMET and Mg in 2 weeks. Continue Coreg 6.25mg  BID. Can hopefully add back Spironolactone once on optimized dose of Entresto. Once on optimized medical therapy, would repeat an echo again in 2-3 months.   3. HTN - BP was elevated at 144/88 on most recent check. Continue Coreg at current dosing but will titrate Entresto to 49-51mg  BID as outlined above.  4. HLD -  followed by PCP. She remains on Atorvastatin 80mg  daily.   5. Stage 3-4 CKD - baseline creatinine 1.8 - 2.0. At 1.81 upon hospital discharge  on 12/31/2019 (peaked at 2.67 during admission). Repeat BMET in 2 weeks.   COVID-19 Education: The signs and symptoms of COVID-19 were discussed with the patient and how to seek care for testing (follow up with PCP or arrange E-visit). The importance of social distancing was discussed today.  Time:   Today, I have spent 16 minutes with the patient with telehealth technology discussing the above problems.     Medication Adjustments/Labs and Tests Ordered: Current medicines are reviewed at length with the patient today.  Concerns regarding medicines are outlined above.   Tests Ordered: Orders Placed This Encounter  Procedures  . Basic Metabolic Panel (BMET)  . Magnesium  . TSH    Medication Changes: Meds ordered this encounter  Medications  . sacubitril-valsartan (ENTRESTO) 49-51 MG    Sig: Take 1 tablet by mouth 2 (two) times daily.    Dispense:  60 tablet    Refill:  11    Follow Up:  In Person in 4-6 week(s)  Signed, Erma Heritage, PA-C  02/26/2020 3:55 PM    [place]

## 2020-03-04 ENCOUNTER — Emergency Department (HOSPITAL_COMMUNITY)
Admission: EM | Admit: 2020-03-04 | Discharge: 2020-03-04 | Disposition: A | Discharge status: Home / Self Care | Payer: Medicare HMO | Attending: Emergency Medicine | Admitting: Emergency Medicine

## 2020-03-04 ENCOUNTER — Emergency Department (HOSPITAL_COMMUNITY): Payer: Medicare HMO

## 2020-03-04 ENCOUNTER — Other Ambulatory Visit: Payer: Self-pay

## 2020-03-04 ENCOUNTER — Encounter (HOSPITAL_COMMUNITY): Payer: Self-pay | Admitting: *Deleted

## 2020-03-04 DIAGNOSIS — M069 Rheumatoid arthritis, unspecified: Secondary | ICD-10-CM | POA: Diagnosis not present

## 2020-03-04 DIAGNOSIS — M25551 Pain in right hip: Secondary | ICD-10-CM | POA: Insufficient documentation

## 2020-03-04 DIAGNOSIS — M545 Low back pain: Secondary | ICD-10-CM | POA: Diagnosis not present

## 2020-03-04 MED ORDER — METHOCARBAMOL 500 MG PO TABS: 500.0000 mg | ORAL_TABLET | Freq: Three times a day (TID) | ORAL | 0 refills | Status: DC | PRN

## 2020-03-04 NOTE — ED Triage Notes (Signed)
Pt with right hip pain since yesterday, denies any injury or fall.  Denies hx of pain.  Pain radiates down leg to foot. Pain with ambulation.

## 2020-03-04 NOTE — ED Provider Notes (Signed)
[facility] EMERGENCY DEPARTMENT Provider Note   CSN: [id number] Arrival date & time: 03/04/20  1249     History Chief Complaint  Patient presents with  . Hip Pain    Maureen Jackson is a 64 y.o. female.   Hip Pain Pertinent negatives include no abdominal pain and no shortness of breath.   Patient presents with right hip and back pain.  Patient presents with pain in her right hip and back since yesterday.  Has been constant.  Worse with movement.  No dysuria.  Has had a history of arthritis.  No fevers.  No abdominal pain.  No numbness or weakness in the leg.    Past Medical History:  Diagnosis Date  . 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 Bleeding from mouth    when she brushed teeth or ate  . Chronic systolic CHF (congestive heart failure) ([place])   . Coronary artery disease 09/2017   a. multivessel CAD by cath in 09/2017 and not felt to be a CABG candidate --> underwent two-vessel PCI with DES to the LAD and DES to the RCA  . Diabetes mellitus   . Dyspnea   . Glaucoma   . Hypertension   . Left bundle branch block   . Morbid obesity ([place])   . Obstructive sleep apnea    does not wear CPAP  . Thyroid disease     Patient Active Problem List   Diagnosis Date Noted  . Acute respiratory disease due to COVID-19 virus 12/26/2019  . Pressure injury of skin 09/21/2019  . Drug rash   . Abscess   . Fistula   . Fournier's gangrene in female   . Chest pain 07/16/2018  . Hyperlipidemia 04/10/2018  . TIA (transient ischemic attack) 03/14/2018  . Vertigo 03/14/2018  . Chronic combined systolic and diastolic CHF (congestive heart failure) (EF 30 to 35 %) 03/14/2018  . Ischemic cardiomyopathy 11/06/2017  . CAD, multiple vessel   . Pericardial effusion 09/07/2017  . Acute combined systolic (congestive) and diastolic (congestive) heart failure ([place]) 09/06/2017  . Cardiomegaly 09/05/2017  . Morbid obesity/BMI > 55 03/04/2013  . Labyrinthitis 03/04/2013  . Diabetes mellitus type 2  with complications, uncontrolled ([place]) 03/04/2013  . GASTRITIS 12/13/2006  . HIATAL HERNIA, HX OF 12/13/2006  . THYROIDECTOMY, HX OF 12/13/2006  . Hypothyroidism 10/04/2006  . TOBACCO ABUSE 10/04/2006  . CARPAL TUNNEL SYNDROME 10/04/2006  . Essential hypertension 10/04/2006  . GERD 10/04/2006  . POSTMENOPAUSAL STATUS 10/04/2006  . SKIN TAG 10/04/2006  . KNEE PAIN, LEFT 10/04/2006  . Sleep apnea 10/04/2006  . LEG EDEMA 10/04/2006    Past Surgical History:  Procedure Laterality Date  . ABDOMINAL HYSTERECTOMY    . CARDIAC CATHETERIZATION  09/12/2017  . CORONARY STENT INTERVENTION  09/12/2017   STENT RESOLUTE ONYX 9.83J82 drug eluting stent was successfully placed  . CORONARY STENT INTERVENTION N/A 09/12/2017   Procedure: CORONARY STENT INTERVENTION;  Surgeon: Leonie Man, MD;  Location: [place] [facility];  Service: Cardiovascular;  Laterality: N/A;  . INCISION AND DRAINAGE PERIRECTAL ABSCESS Left 09/16/2019   Procedure: IRRIGATION AND DEBRIDEMENT LABIA ABSCESS;  Surgeon: Coralie Keens, MD;  Location: [place];  Service: General;  Laterality: Left;  . IR FLUORO GUIDE CV LINE RIGHT  09/16/2019  . IR US GUIDE VASC ACCESS RIGHT  09/16/2019  . LEFT HEART CATH AND CORONARY ANGIOGRAPHY N/A 09/12/2017   Procedure: LEFT HEART CATH AND CORONARY ANGIOGRAPHY;  Surgeon: Leonie Man, MD;  Location: [place] [facility];  Service: Cardiovascular;  Laterality: N/A;  . MASS EXCISION N/A 12/18/2018   Procedure: EXCISION TONGUE MASS;  Surgeon: Leta Baptist, MD;  Location: [place] OR;  Service: ENT;  Laterality: N/A;  . RIGHT/LEFT HEART CATH AND CORONARY ANGIOGRAPHY N/A 09/10/2017   Procedure: RIGHT/LEFT HEART CATH AND CORONARY ANGIOGRAPHY;  Surgeon: Troy Sine, MD;  Location: [place] CV [facility];  Service: Cardiovascular;  Laterality: N/A;  . THYROID SURGERY       OB History   No obstetric history on file.     Family History  Problem Relation Age of Onset  . Diabetes Mother   . Hypertension  Mother   . Cancer Mother        pancreas  . Hypertension Sister     Social History   Tobacco Use  . Smoking status: Former Smoker    Quit date: 12/14/2005    Years since quitting: 14.2  . Smokeless tobacco: Never Used  . Tobacco comment: QUIT IN 2005  Substance Use Topics  . Alcohol use: No  . Drug use: No    Home Medications Prior to Admission medications   Medication Sig Start Date End Date Taking? Authorizing Provider  aspirin EC 81 MG tablet Take 81 mg by mouth daily.   Yes [provider]  atorvastatin (LIPITOR) 80 MG tablet TAKE 1 TABLET(80 MG) BY MOUTH DAILY AT 6 PM Patient taking differently: Take 80 mg by mouth daily at 6 PM.  10/17/19  Yes Branch, Alphonse Guild, MD  carvedilol (COREG) 6.25 MG tablet Take 1 tablet (6.25 mg total) by mouth 2 (two) times daily with a meal. 11/03/19  Yes Branch, Alphonse Guild, MD  cetirizine (ZYRTEC) 10 MG tablet Take 10 mg by mouth daily as needed for allergies.  09/12/18  Yes [provider]  clopidogrel (PLAVIX) 75 MG tablet TAKE 1 TABLET(75 MG) BY MOUTH DAILY WITH BREAKFAST Patient taking differently: Take 75 mg by mouth daily.  09/02/19  Yes Branch, Alphonse Guild, MD  furosemide (LASIX) 40 MG tablet Take 1 tablet (40 mg total) by mouth daily. 09/14/17  Yes Ghimire, Henreitta Leber, MD  JARDIANCE 10 MG TABS tablet Take 10 mg by mouth daily. 10/21/19  Yes [provider]  levothyroxine (SYNTHROID, LEVOTHROID) 125 MCG tablet Take 125 mcg by mouth daily before breakfast.  08/16/18  Yes [provider]  NOVOLOG FLEXPEN 100 UNIT/ML FlexPen Inject 18 Units into the skin 3 (three) times daily with meals. 12/31/19  Yes Gherghe, Vella Redhead, MD  ondansetron (ZOFRAN ODT) 4 MG disintegrating tablet Take 1 tablet (4 mg total) by mouth every 8 (eight) hours as needed. Patient taking differently: Take 4 mg by mouth every 8 (eight) hours as needed for nausea or vomiting.  09/06/19  Yes Fredia Sorrow, MD  sacubitril-valsartan (ENTRESTO) 49-51  MG Take 1 tablet by mouth 2 (two) times daily. 02/26/20  Yes Strader, [place] M, PA-C  timolol (TIMOPTIC) 0.5 % ophthalmic solution Place 1 drop into both eyes 2 (two) times daily. 10/07/18  Yes [provider]  traMADol (ULTRAM) 50 MG tablet Take 1 tablet (50 mg total) by mouth every 6 (six) hours as needed for severe pain. Patient taking differently: Take 50 mg by mouth every 6 (six) hours as needed for moderate pain or severe pain.  10/02/19  Yes Dessa Phi, DO  TRESIBA FLEXTOUCH 100 UNIT/ML SOPN FlexTouch Pen Inject 60 Units into the skin daily.  10/08/19  Yes [provider]  VENTOLIN HFA  108 (90 Base) MCG/ACT inhaler Inhale 1-2 puffs into the lungs every 6 (six) hours as needed for wheezing or shortness of breath. Patient taking differently: Inhale 2 puffs into the lungs every 6 (six) hours as needed for wheezing or shortness of breath.  09/14/17  Yes Ghimire, Henreitta Leber, MD  methocarbamol (ROBAXIN) 500 MG tablet Take 1 tablet (500 mg total) by mouth every 8 (eight) hours as needed for muscle spasms. 03/04/20   Davonna Belling, MD    Allergies    Ampicillin, Hydrocodone, Aspirin, and Unasyn [ampicillin-sulbactam sodium]  Review of Systems   Review of Systems  Constitutional: Negative for appetite change.  HENT: Negative for congestion.   Respiratory: Negative for shortness of breath.   Gastrointestinal: Negative for abdominal pain.  Genitourinary: Negative for dysuria and flank pain.  Musculoskeletal: Positive for back pain.       Right hip pain.  Skin: Negative for rash.  Neurological: Negative for weakness and numbness.    Physical Exam Updated Vital Signs BP (!) 148/78 (BP Location: Right Arm)   Pulse 90   Temp 99 F (37.2 C) (Oral)   Resp 19   Ht 5\' 5"  (1.651 m)   Wt (!) 152 kg   SpO2 95%   BMI 55.75 kg/m   Physical Exam Vitals and nursing note reviewed.  Constitutional:      Appearance: She is obese.  HENT:     Head: Normocephalic.  Eyes:       Extraocular Movements: Extraocular movements intact.  Pulmonary:     Breath sounds: No wheezing or rhonchi.  Abdominal:     Tenderness: There is no abdominal tenderness.  Musculoskeletal:     Comments: Tenderness to right posterior hip/low back area.  Decent range of motion in hip.  Neuro vas intact in right foot.  No abdominal tenderness.  Skin:    Capillary Refill: Capillary refill takes less than 2 seconds.  Neurological:     Mental Status: She is alert and oriented to person, place, and time.     ED Results / Procedures / Treatments   Labs (all labs ordered are listed, but only abnormal results are displayed) Labs Reviewed - No data to display  EKG None  Radiology DG Hip Unilat W or Wo Pelvis 2-3 Views Right  Result Date: 03/04/2020 CLINICAL DATA:  Pt with right hip pain since yesterday, denies any injury or fall. Denies hx of pain. Pain radiates down leg to foot. Pain with ambulation. EXAM: DG HIP (WITH OR WITHOUT PELVIS) 2-3V RIGHT COMPARISON:  None. FINDINGS: No fracture or bone lesion. Hip joints are normally spaced and aligned. There is mild bony prominence at the superior femoral head neck junction. SI joints are normally spaced and aligned as is the pubic symphysis. Soft tissues are unremarkable. IMPRESSION: 1. No fracture or acute finding. 2. No significant hip joint degenerative/arthropathic change. 3. Bony prominence along the superior right femoral head neck junction suggests femoroacetabular impingement. Electronically Signed   By: Lajean Manes M.D.   On: 03/04/2020 13:51    Procedures Procedures (including critical care time)  Medications Ordered in ED Medications - No data to display  ED Course  I have reviewed the triage vital signs and the nursing notes.  Pertinent labs & imaging results that were available during my care of the patient were reviewed by me and considered in my medical decision making (see chart for details).    MDM  Rules/Calculators/A&P  Patient with atraumatic low back pain.  Has potential of femoral acetabular impingement on x-ray.  No fevers.  No dysuria.  No abdominal pain.  Think likely musculoskeletal.  Will treat with muscle relaxers.  Will not do steroids due to diabetes.  Orthopedic surgery follow-up Final Clinical Impression(s) / ED Diagnoses Final diagnoses:  Right hip pain    Rx / DC Orders ED Discharge Orders         Ordered    methocarbamol (ROBAXIN) 500 MG tablet  Every 8 hours PRN     03/04/20 1819           Davonna Belling, MD 03/04/20 1826

## 2020-03-04 NOTE — Discharge Instructions (Signed)
Follow with orthopedic surgery for the hip/back pain.  Take the muscle relaxers as needed.

## 2020-03-10 ENCOUNTER — Ambulatory Visit: Payer: Medicare HMO | Admitting: Student

## 2020-03-16 ENCOUNTER — Other Ambulatory Visit: Payer: Self-pay | Admitting: Internal Medicine

## 2020-03-16 DIAGNOSIS — Z1231 Encounter for screening mammogram for malignant neoplasm of breast: Secondary | ICD-10-CM

## 2020-03-30 ENCOUNTER — Ambulatory Visit: Payer: Medicare HMO | Admitting: Cardiology

## 2020-04-02 ENCOUNTER — Ambulatory Visit: Payer: Medicare HMO | Admitting: Cardiology

## 2020-04-07 NOTE — Progress Notes (Signed)
Virtual Visit via Telephone Note   This visit type was conducted due to national recommendations for restrictions regarding the COVID-19 Pandemic (e.g. social distancing) in an effort to limit this patient's exposure and mitigate transmission in our community.  Due to her co-morbid illnesses, this patient is at least at moderate risk for complications without adequate follow up.  This format is felt to be most appropriate for this patient at this time.  The patient did not have access to video technology/had technical difficulties with video requiring transitioning to audio format only (telephone).  All issues noted in this document were discussed and addressed.  No physical exam could be performed with this format.  Please refer to the patient's chart for her  consent to telehealth for [facility].   The patient was identified using 2 identifiers.  Date:  04/13/2020   ID:  Maureen Jackson, DOB 16-Apr-1956, MRN [id number]  Patient Location: Home Provider Location: Office  PCP:  Neale Burly, MD  Cardiologist:  Carlyle Dolly, MD   Electrophysiologist:  None   Evaluation Performed:  Follow-Up Visit  Chief Complaint:  follow up  History of Present Illness:    Maureen Jackson is a 64 y.o. female with with past medical history of CAD (s/p DES to LAD and RCA in 09/2017), ischemic cardiomyopathy (EF 30-35% in 09/2017, at 35-40% by repeat in 12/2018), HTN, HLD, Type 2 DM, Stage 4 CKD and obesity who presents for a 33-month follow-up telehealth visit.    She was admitted to [facility] in 12/2019 for acute hypoxic respiratory failure in the setting of COVID-19 PNA. She was treated with Remdesivir along with IV steroids. She was treated for acute on chronic CHF exacerbation but developed an AKI and hypotension with diuretic therapy, therefore Aldactone and Entresto were held at discharge.  Entresto was restarted by Dr. Harl Bowie 01/28/2020 24/26 mg twice daily.  Patient saw Bernerd Pho,  PA-C 02/26/2020 was still having dyspnea on exertion and fatigue since having Covid and BP was running high and Entresto titrated up to 49/51 mg twice daily.  Plans were to optimize Entresto and add back spironolactone if labs allow.  Patient switched to virtual appt last minute. Complains of right leg throbbing since she had covid19. Went to ER 03/04/20 for it and was told it was a nerve problem and now on muscle relaxant. Hasn't had BMET done yet. Says BP is doing well. Sometimes has dyspnea on exertion but no edema.   The patient does not have symptoms concerning for COVID-19 infection (fever, chills, cough, or new shortness of breath).    Past Medical History:  Diagnosis Date  . 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 Bleeding from mouth    when she brushed teeth or ate  . Chronic systolic CHF (congestive heart failure) ([place])   . Coronary artery disease 09/2017   a. multivessel CAD by cath in 09/2017 and not felt to be a CABG candidate --> underwent two-vessel PCI with DES to the LAD and DES to the RCA  . Diabetes mellitus   . Dyspnea   . Glaucoma   . Hypertension   . Left bundle branch block   . Morbid obesity ([place])   . Obstructive sleep apnea    does not wear CPAP  . Thyroid disease    Past Surgical History:  Procedure Laterality Date  . ABDOMINAL HYSTERECTOMY    . CARDIAC CATHETERIZATION  09/12/2017  . CORONARY STENT INTERVENTION  09/12/2017  STENT RESOLUTE ONYX [id number] drug eluting stent was successfully placed  . CORONARY STENT INTERVENTION N/A 09/12/2017   Procedure: CORONARY STENT INTERVENTION;  Surgeon: Leonie Man, MD;  Location: [place] [facility];  Service: Cardiovascular;  Laterality: N/A;  . INCISION AND DRAINAGE PERIRECTAL ABSCESS Left 09/16/2019   Procedure: IRRIGATION AND DEBRIDEMENT LABIA ABSCESS;  Surgeon: Coralie Keens, MD;  Location: [place];  Service: General;  Laterality: Left;  . IR FLUORO GUIDE CV LINE RIGHT  09/16/2019  . IR US GUIDE VASC ACCESS RIGHT   09/16/2019  . LEFT HEART CATH AND CORONARY ANGIOGRAPHY N/A 09/12/2017   Procedure: LEFT HEART CATH AND CORONARY ANGIOGRAPHY;  Surgeon: Leonie Man, MD;  Location: [place] CV [facility];  Service: Cardiovascular;  Laterality: N/A;  . MASS EXCISION N/A 12/18/2018   Procedure: EXCISION TONGUE MASS;  Surgeon: Leta Baptist, MD;  Location: [place] OR;  Service: ENT;  Laterality: N/A;  . RIGHT/LEFT HEART CATH AND CORONARY ANGIOGRAPHY N/A 09/10/2017   Procedure: RIGHT/LEFT HEART CATH AND CORONARY ANGIOGRAPHY;  Surgeon: Troy Sine, MD;  Location: [place] [facility];  Service: Cardiovascular;  Laterality: N/A;  . THYROID SURGERY       No outpatient medications have been marked as taking for the 04/13/20 encounter (Telemedicine) with [name], PA-C.     Allergies:   Ampicillin, Hydrocodone, Aspirin, and Unasyn [ampicillin-sulbactam sodium]   Social History   Tobacco Use  . Smoking status: Former Smoker    Quit date: 12/14/2005    Years since quitting: 14.3  . Smokeless tobacco: Never Used  . Tobacco comment: QUIT IN 2005  Substance Use Topics  . Alcohol use: No  . Drug use: No     Family Hx: The patient's family history includes Cancer in her mother; Diabetes in her mother; Hypertension in her mother and sister.  ROS:   Please see the history of present illness.      All other systems reviewed and are negative.   Prior CV studies:   The following studies were reviewed today:  Echocardiogram: 12/2018 Study Conclusions   - Limited study to evaluate LV function.  - Technically difficult study, poor visualization. Technical issues    with attempts at echo contrast injections, inadequate contrast    effect on imaging despite multiple doses.  - LVEF appears grossly in the 35-40% range.   Labs/Other Tests and Data Reviewed:    EKG:  No ECG reviewed.  Recent Labs: 12/28/2019: B Natriuretic Peptide 57.5 12/31/2019: ALT 27; BUN 97; Creatinine, Ser 1.81; Hemoglobin 11.9; Magnesium 2.6;  Platelets 224; Potassium 5.1; Sodium 132   Recent Lipid Panel Lab Results  Component Value Date/Time   CHOL 147 07/17/2018 01:52 AM   TRIG 124 12/26/2019 03:46 PM   HDL 31 (L) 07/17/2018 01:52 AM   CHOLHDL 4.7 07/17/2018 01:52 AM   LDLCALC 83 07/17/2018 01:52 AM    Wt Readings from Last 3 Encounters:  04/13/20 (!) 335 lb (152 kg)  03/04/20 (!) 335 lb (152 kg)  02/26/20 (!) 334 lb (151.5 kg)     Objective:    Vital Signs:  BP 138/82   Ht 5\' 5"  (1.651 m)   Wt (!) 335 lb (152 kg)   BMI 55.75 kg/m    VITAL SIGNS:  reviewed  ASSESSMENT & PLAN:    CAD status post DES to the LAD and RCA 09/2017 on aspirin, Plavix, Coreg and atorvastatin-no angina  Ischemic cardiomyopathy with chronic systolic CHF EF 35 to 26% on echo  12/2018.  Entresto increased to 49/51 mg twice daily 02/26/2020.  Consider adding spironolactone and titrating Entresto up further. Patient hasn't had bmet-says she'll come in this week. If stable increase entresto 97/103 mg bid. Repeat bmet in 2-3 weeks. 2 gm sodium diet.  Essential hypertension controlled  Hyperlipidemia on atorvastatin 80 mg daily  CKD stage III-IV-check bmet this week   COVID-19 Education: The signs and symptoms of COVID-19 were discussed with the patient and how to seek care for testing (follow up with PCP or arrange E-visit).   The importance of social distancing was discussed today.  Time:   Today, I have spent  6:30 minutes with the patient with telehealth technology discussing the above problems.     Medication Adjustments/Labs and Tests Ordered: Current medicines are reviewed at length with the patient today.  Concerns regarding medicines are outlined above.   Tests Ordered: No orders of the defined types were placed in this encounter.   Medication Changes: No orders of the defined types were placed in this encounter.   Follow Up:  Either In Person or Virtual in 3 week(s) PA/Dr. Harl Bowie   Signed, Ermalinda Barrios, PA-C    04/13/2020 10:38 AM    [place]

## 2020-04-13 ENCOUNTER — Telehealth (INDEPENDENT_AMBULATORY_CARE_PROVIDER_SITE_OTHER): Payer: Medicare HMO | Admitting: Physician Assistant

## 2020-04-13 ENCOUNTER — Encounter: Payer: Self-pay | Admitting: Physician Assistant

## 2020-04-13 VITALS — BP 138/82 | Ht 65.0 in | Wt 335.0 lb

## 2020-04-13 DIAGNOSIS — I255 Ischemic cardiomyopathy: Secondary | ICD-10-CM

## 2020-04-13 DIAGNOSIS — I2510 Atherosclerotic heart disease of native coronary artery without angina pectoris: Secondary | ICD-10-CM | POA: Diagnosis not present

## 2020-04-13 DIAGNOSIS — N183 Chronic kidney disease, stage 3 unspecified: Secondary | ICD-10-CM | POA: Diagnosis not present

## 2020-04-13 DIAGNOSIS — E785 Hyperlipidemia, unspecified: Secondary | ICD-10-CM

## 2020-04-13 DIAGNOSIS — I10 Essential (primary) hypertension: Secondary | ICD-10-CM

## 2020-04-13 DIAGNOSIS — I5022 Chronic systolic (congestive) heart failure: Secondary | ICD-10-CM

## 2020-04-13 NOTE — Patient Instructions (Signed)
Medication Instructions:  Your physician recommends that you continue on your current medications as directed. Please refer to the Current Medication list given to you today.  *If you need a refill on your cardiac medications before your next appointment, please call your pharmacy*   Lab Work: Your physician recommends that you return for lab work in: Today   If you have labs (blood work) drawn today and your tests are completely normal, you will receive your results only by: Marland Kitchen MyChart Message (if you have MyChart) OR . A paper copy in the mail If you have any lab test that is abnormal or we need to change your treatment, we will call you to review the results.   Testing/Procedures: NONE    Follow-Up: At [facility], you and your health needs are our priority.  As part of our continuing mission to provide you with exceptional heart care, we have created designated Provider Care Teams.  These Care Teams include your primary Cardiologist (physician) and Advanced Practice Providers (APPs -  Physician Assistants and Nurse Practitioners) who all work together to provide you with the care you need, when you need it.  We recommend signing up for the patient portal called "MyChart".  Sign up information is provided on this After Visit Summary.  MyChart is used to connect with patients for Virtual Visits (Telemedicine).  Patients are able to view lab/test results, encounter notes, upcoming appointments, etc.  Non-urgent messages can be sent to your provider as well.   To learn more about what you can do with MyChart, go to [email or web address].    Your next appointment:   3 week(s)  The format for your next appointment:   In Person  Provider:   You may see Carlyle Dolly, MD or one of the following Advanced Practice Providers on your designated Care Team:    Bernerd Pho, PA-C   Ermalinda Barrios, [place]     Other Instructions Thank you for choosing [facility]!

## 2020-04-14 ENCOUNTER — Other Ambulatory Visit: Payer: Self-pay

## 2020-04-14 ENCOUNTER — Other Ambulatory Visit (HOSPITAL_COMMUNITY)
Admission: RE | Admit: 2020-04-14 | Discharge: 2020-04-14 | Disposition: A | Discharge status: Home / Self Care | Payer: Medicare HMO | Source: Ambulatory Visit | Attending: Student | Admitting: Student

## 2020-04-14 ENCOUNTER — Telehealth: Payer: Self-pay | Admitting: *Deleted

## 2020-04-14 DIAGNOSIS — Z79899 Other long term (current) drug therapy: Secondary | ICD-10-CM | POA: Diagnosis not present

## 2020-04-14 LAB — BASIC METABOLIC PANEL
Anion gap: 7 (ref 5–15)
BUN: 24 mg/dL — ABNORMAL HIGH (ref 8–23)
CO2: 26 mmol/L (ref 22–32)
Calcium: 8.3 mg/dL — ABNORMAL LOW (ref 8.9–10.3)
Chloride: 108 mmol/L (ref 98–111)
Creatinine, Ser: 1.07 mg/dL — ABNORMAL HIGH (ref 0.44–1.00)
GFR calc Af Amer: >60 mL/min (ref >60)
GFR calc non Af Amer: 56 mL/min — ABNORMAL LOW (ref >60)
Glucose, Bld: 179 mg/dL — ABNORMAL HIGH (ref 70–99)
Potassium: 4 mmol/L (ref 3.5–5.1)
Sodium: 141 mmol/L (ref 135–145)

## 2020-04-14 LAB — TSH: TSH: 85.399 u[IU]/mL — ABNORMAL HIGH (ref 0.350–4.500)

## 2020-04-14 LAB — MAGNESIUM: Magnesium: 2.1 mg/dL (ref 1.7–2.4)

## 2020-04-14 MED ORDER — ENTRESTO 97-103 MG PO TABS: 1.0000 | ORAL_TABLET | Freq: Two times a day (BID) | ORAL | 11 refills | Status: DC

## 2020-04-14 NOTE — Telephone Encounter (Signed)
Pt notified orders placed  

## 2020-04-14 NOTE — Telephone Encounter (Signed)
-----   Message from Erma Heritage, [place] sent at 04/14/2020 12:50 PM EDT ----- Please let the patient know her electrolytes are within a normal range and kidney function has significantly improved since hospital discharge. Would titrate Entresto to 97-103mg  BID and plan for repeat BMET in 2 weeks. Magnesium within a normal range. TSH is significantly elevated to 85.399. Has she been taking her Synthroid? If she has been taking this, she needs to follow-up with her PCP ASAP for dose adjustment. Please forward a copy of results to Neale Burly, MD.

## 2020-04-22 DIAGNOSIS — I10 Essential (primary) hypertension: Secondary | ICD-10-CM | POA: Diagnosis not present

## 2020-04-22 DIAGNOSIS — I5022 Chronic systolic (congestive) heart failure: Secondary | ICD-10-CM | POA: Diagnosis not present

## 2020-04-22 DIAGNOSIS — Z1331 Encounter for screening for depression: Secondary | ICD-10-CM | POA: Diagnosis not present

## 2020-04-22 DIAGNOSIS — I872 Venous insufficiency (chronic) (peripheral): Secondary | ICD-10-CM | POA: Diagnosis not present

## 2020-04-22 DIAGNOSIS — Z0000 Encounter for general adult medical examination without abnormal findings: Secondary | ICD-10-CM | POA: Diagnosis not present

## 2020-04-22 DIAGNOSIS — E1165 Type 2 diabetes mellitus with hyperglycemia: Secondary | ICD-10-CM | POA: Diagnosis not present

## 2020-04-22 DIAGNOSIS — Z6841 Body Mass Index (BMI) 40.0 and over, adult: Secondary | ICD-10-CM | POA: Diagnosis not present

## 2020-04-22 DIAGNOSIS — E038 Other specified hypothyroidism: Secondary | ICD-10-CM | POA: Diagnosis not present

## 2020-04-23 ENCOUNTER — Telehealth: Payer: Self-pay

## 2020-04-23 NOTE — Telephone Encounter (Signed)
Pt would like to know if you want her to include Cardiology ordered labs with her labs being ordered by PMD.   Please call [phone]   Thanks renee

## 2020-04-23 NOTE — Telephone Encounter (Signed)
Pt will have labs done while at pcp office

## 2020-04-26 NOTE — Progress Notes (Deleted)
Cardiology Office Note    Date:  04/26/2020   ID:  MANDEEP FERCH, DOB June 09, 1956, MRN [id number]  PCP:  Neale Burly, MD  Cardiologist: Carlyle Dolly, MD EPS: None  No chief complaint on file.   History of Present Illness:  Maureen Jackson is a 64 y.o. female with past medical history of CAD (s/p DES to LAD and RCA in 09/2017), ischemic cardiomyopathy (EF 30-35% in 09/2017, at 35-40% by repeat in 12/2018), HTN, HLD, Type 2 DM, Stage 4 CKD and obesity who presents for a 53-month follow-up telehealth visit.    She was admitted to [facility] in 12/2019 for acute hypoxic respiratory failure in the setting of COVID-19 PNA. She was treated with Remdesivir along with IV steroids. She was treated for acute on chronic CHF exacerbation but developed an AKI and hypotension with diuretic therapy, therefore Aldactone and Entresto were held at discharge.  Entresto was restarted by Dr. Harl Bowie 01/28/2020 24/26 mg twice daily.   Patient saw Bernerd Pho, PA-C 02/26/2020 was still having dyspnea on exertion and fatigue since having Covid and BP was running high and Entresto titrated up to 49/51 mg twice daily.  Plans were to optimize Entresto and add back spironolactone if labs allow.   I had a telemedicine visit with the patient 04/13/2020 at which time she was complaining of right leg throbbing for which she was on a muscle relaxant.  Creatinine stable so Entresto increased to 97/103 mg twice daily. TSH 85. Was to f/u with PCP  Past Medical History:  Diagnosis Date  . 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 Bleeding from mouth    when she brushed teeth or ate  . Chronic systolic CHF (congestive heart failure) ([place])   . Coronary artery disease 09/2017   a. multivessel CAD by cath in 09/2017 and not felt to be a CABG candidate --> underwent two-vessel PCI with DES to the LAD and DES to the RCA  . Diabetes mellitus   . Dyspnea   . Glaucoma   . Hypertension   . Left bundle branch block   . Morbid  obesity ([place])   . Obstructive sleep apnea    does not wear CPAP  . Thyroid disease     Past Surgical History:  Procedure Laterality Date  . ABDOMINAL HYSTERECTOMY    . CARDIAC CATHETERIZATION  09/12/2017  . CORONARY STENT INTERVENTION  09/12/2017   STENT RESOLUTE ONYX 5.70V77 drug eluting stent was successfully placed  . CORONARY STENT INTERVENTION N/A 09/12/2017   Procedure: CORONARY STENT INTERVENTION;  Surgeon: Leonie Man, MD;  Location: [facility];  Service: Cardiovascular;  Laterality: N/A;  . INCISION AND DRAINAGE PERIRECTAL ABSCESS Left 09/16/2019   Procedure: IRRIGATION AND DEBRIDEMENT LABIA ABSCESS;  Surgeon: Coralie Keens, MD;  Location: [facility] [facility];  Service: General;  Laterality: Left;  . IR FLUORO GUIDE CV LINE RIGHT  09/16/2019  . IR US GUIDE VASC ACCESS RIGHT  09/16/2019  . LEFT HEART CATH AND CORONARY ANGIOGRAPHY N/A 09/12/2017   Procedure: LEFT HEART CATH AND CORONARY ANGIOGRAPHY;  Surgeon: Leonie Man, MD;  Location: [facility];  Service: Cardiovascular;  Laterality: N/A;  . MASS EXCISION N/A 12/18/2018   Procedure: EXCISION TONGUE MASS;  Surgeon: Leta Baptist, MD;  Location: [facility];  Service: ENT;  Laterality: N/A;  . RIGHT/LEFT HEART CATH AND CORONARY ANGIOGRAPHY N/A 09/10/2017   Procedure: RIGHT/LEFT HEART CATH AND CORONARY ANGIOGRAPHY;  Surgeon: Troy Sine,  MD;  Location: [facility];  Service: Cardiovascular;  Laterality: N/A;  . THYROID SURGERY      Current Medications: No outpatient medications have been marked as taking for the 05/03/20 encounter (Appointment) with [name], PA-C.     Allergies:   Ampicillin, Hydrocodone, Aspirin, and Unasyn [ampicillin-sulbactam sodium]   Social History   Socioeconomic History  . Marital Jackson: Widowed    Spouse name: Not on file  . Number of children: Not on file  . Years of education: Not on file  . Highest education level: Not on file  Occupational History  . 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 Smoking Jackson: Former Smoker    Quit date: 12/14/2005    Years since quitting: 14.3  . Smokeless tobacco: Never Used  . Tobacco comment: QUIT IN 2005  Substance and Sexual Activity  . Alcohol use: No  . Drug use: No  . Sexual activity: Never  Other Topics Concern  . Not on file  Social History Narrative  . Not on file   Social Determinants of Health   Financial Resource Strain:   . Difficulty of Paying Living Expenses:   Food Insecurity: No Food Insecurity  . Worried About Charity fundraiser in the Last Year: Never true  . Ran Out of Food in the Last Year: Never true  Transportation Needs: Unmet Transportation Needs  . Lack of Transportation (Medical): Yes  . Lack of Transportation (Non-Medical): No  Physical Activity:   . Days of Exercise per Week:   . Minutes of Exercise per Session:   Stress:   . Feeling of Stress :   Social Connections:   . Frequency of Communication with Friends and Family:   . Frequency of Social Gatherings with Friends and Family:   . Attends Religious Services:   . Active Member of Clubs or Organizations:   . Attends Archivist Meetings:   Maureen Jackson:      Family History:  The patient's ***family history includes Cancer in her mother; Diabetes in her mother; Hypertension in her mother and sister.   ROS:   Please see the history of present illness.    ROS All other systems reviewed and are negative.   PHYSICAL EXAM:   VS:  There were no vitals taken for this visit.  Physical Exam  GEN: Well nourished, well developed, in no acute distress  HEENT: normal  Neck: no JVD, carotid bruits, or masses Cardiac:RRR; no murmurs, rubs, or gallops  Respiratory:  clear to auscultation bilaterally, normal work of breathing GI: soft, nontender, nondistended, + BS Ext: without cyanosis, clubbing, or edema, Good distal pulses bilaterally MS: no deformity or atrophy  Skin: warm and  dry, no rash Neuro:  Alert and Oriented x 3, Strength and sensation are intact Psych: euthymic mood, full affect  Wt Readings from Last 3 Encounters:  04/13/20 (!) 335 lb (152 kg)  03/04/20 (!) 335 lb (152 kg)  02/26/20 (!) 334 lb (151.5 kg)      Studies/Labs Reviewed:   EKG:  EKG is*** ordered today.  The ekg ordered today demonstrates ***  Recent Labs: 12/28/2019: B Natriuretic Peptide 57.5 12/31/2019: ALT 27; Hemoglobin 11.9; Platelets 224 04/14/2020: BUN 24; Creatinine, Ser 1.07; Magnesium 2.1; Potassium 4.0; Sodium 141; TSH 85.399   Lipid Panel    Component Value Date/Time   CHOL 147 07/17/2018 0152   TRIG 124 12/26/2019 1546   HDL 31 (  L) 07/17/2018 0152   CHOLHDL 4.7 07/17/2018 0152   VLDL 33 07/17/2018 0152   LDLCALC 83 07/17/2018 0152    Additional studies/ records that were reviewed today include:  Echocardiogram: 12/2018 Study Conclusions   - Limited study to evaluate LV function.  - Technically difficult study, poor visualization. Technical issues    with attempts at echo contrast injections, inadequate contrast    effect on imaging despite multiple doses.  - LVEF appears grossly in the 35-40% range.       ASSESSMENT:    No diagnosis found.   PLAN:  In order of problems listed above:  CAD Jackson post DES to the LAD and RCA 09/2017 on aspirin, Plavix, Coreg and atorvastatin-no angina   Ischemic cardiomyopathy with chronic systolic CHF EF 35 to 43% on echo 12/2018.  Entresto increased to 97/103 mg twice daily 04/14/2020.  Consider adding spironolactone   Essential hypertension controlled   Hyperlipidemia on atorvastatin 80 mg daily   CKD stage III-IV-check bmet this week   Hypothyroidism TSH came back 85.  Was to follow-up with PCP    Medication Adjustments/Labs and Tests Ordered: Current medicines are reviewed at length with the patient today.  Concerns regarding medicines are outlined above.  Medication changes, Labs and Tests ordered today are  listed in the Patient Instructions below. There are no Patient Instructions on file for this visit.   Sumner Boast, PA-C  04/26/2020 10:08 AM    [facility] [place], [place], [place]  [address] Phone: [phone]; Fax: [phone]

## 2020-04-28 ENCOUNTER — Telehealth: Payer: Self-pay

## 2020-04-28 NOTE — Telephone Encounter (Signed)
Pt rec'd call from her PMD stating her kidney function was low.   Please call [phone]   Thanks renee

## 2020-04-28 NOTE — Telephone Encounter (Signed)
Pt had labs done by PCP. Will request copy of labs be faxed to our office.

## 2020-04-29 ENCOUNTER — Other Ambulatory Visit (HOSPITAL_COMMUNITY): Payer: Self-pay | Admitting: Internal Medicine

## 2020-04-29 DIAGNOSIS — M79604 Pain in right leg: Secondary | ICD-10-CM

## 2020-04-29 DIAGNOSIS — R2241 Localized swelling, mass and lump, right lower limb: Secondary | ICD-10-CM

## 2020-04-30 ENCOUNTER — Other Ambulatory Visit: Payer: Self-pay

## 2020-04-30 ENCOUNTER — Other Ambulatory Visit (HOSPITAL_COMMUNITY)
Admission: RE | Admit: 2020-04-30 | Discharge: 2020-04-30 | Disposition: A | Discharge status: Home / Self Care | Payer: Medicare HMO | Source: Ambulatory Visit | Attending: Internal Medicine | Admitting: Internal Medicine

## 2020-04-30 ENCOUNTER — Ambulatory Visit (HOSPITAL_COMMUNITY)
Admission: RE | Admit: 2020-04-30 | Discharge: 2020-04-30 | Disposition: A | Discharge status: Home / Self Care | Payer: Medicare HMO | Source: Ambulatory Visit | Attending: Internal Medicine | Admitting: Internal Medicine

## 2020-04-30 ENCOUNTER — Other Ambulatory Visit (HOSPITAL_COMMUNITY)
Admission: RE | Admit: 2020-04-30 | Discharge: 2020-04-30 | Disposition: A | Discharge status: Home / Self Care | Payer: Medicare HMO | Source: Ambulatory Visit | Attending: Physician Assistant | Admitting: Physician Assistant

## 2020-04-30 DIAGNOSIS — S8991XA Unspecified injury of right lower leg, initial encounter: Secondary | ICD-10-CM | POA: Diagnosis not present

## 2020-04-30 DIAGNOSIS — R600 Localized edema: Secondary | ICD-10-CM | POA: Diagnosis not present

## 2020-04-30 DIAGNOSIS — M79604 Pain in right leg: Secondary | ICD-10-CM | POA: Diagnosis not present

## 2020-04-30 DIAGNOSIS — R2241 Localized swelling, mass and lump, right lower limb: Secondary | ICD-10-CM | POA: Insufficient documentation

## 2020-04-30 LAB — BASIC METABOLIC PANEL
Anion gap: 12 (ref 5–15)
BUN: 28 mg/dL — ABNORMAL HIGH (ref 8–23)
CO2: 27 mmol/L (ref 22–32)
Calcium: 8.8 mg/dL — ABNORMAL LOW (ref 8.9–10.3)
Chloride: 102 mmol/L (ref 98–111)
Creatinine, Ser: 1.25 mg/dL — ABNORMAL HIGH (ref 0.44–1.00)
GFR calc Af Amer: 53 mL/min — ABNORMAL LOW (ref >60)
GFR calc non Af Amer: 46 mL/min — ABNORMAL LOW (ref >60)
Glucose, Bld: 179 mg/dL — ABNORMAL HIGH (ref 70–99)
Potassium: 4.2 mmol/L (ref 3.5–5.1)
Sodium: 141 mmol/L (ref 135–145)

## 2020-05-01 LAB — PTH, INTACT AND CALCIUM
Calcium, Total (PTH): 8.7 mg/dL (ref 8.7–10.3)
PTH: 18 pg/mL (ref 15–65)

## 2020-05-03 ENCOUNTER — Encounter: Payer: Self-pay | Admitting: Physician Assistant

## 2020-05-03 ENCOUNTER — Ambulatory Visit: Payer: Medicare HMO | Admitting: Physician Assistant

## 2020-05-17 ENCOUNTER — Emergency Department (HOSPITAL_COMMUNITY): Payer: Medicare HMO

## 2020-05-17 ENCOUNTER — Encounter (HOSPITAL_COMMUNITY): Payer: Self-pay | Admitting: *Deleted

## 2020-05-17 ENCOUNTER — Inpatient Hospital Stay (HOSPITAL_COMMUNITY)
Admission: EM | Admit: 2020-05-17 | Discharge: 2020-05-25 | DRG: 291 | Disposition: A | Discharge status: Home Health | Payer: Medicare HMO | Attending: Family Medicine | Admitting: Family Medicine

## 2020-05-17 ENCOUNTER — Other Ambulatory Visit: Payer: Self-pay

## 2020-05-17 DIAGNOSIS — Z8249 Family history of ischemic heart disease and other diseases of the circulatory system: Secondary | ICD-10-CM

## 2020-05-17 DIAGNOSIS — I255 Ischemic cardiomyopathy: Secondary | ICD-10-CM | POA: Diagnosis present

## 2020-05-17 DIAGNOSIS — F329 Major depressive disorder, single episode, unspecified: Secondary | ICD-10-CM | POA: Diagnosis present

## 2020-05-17 DIAGNOSIS — I130 Hypertensive heart and chronic kidney disease with heart failure and stage 1 through stage 4 chronic kidney disease, or unspecified chronic kidney disease: Principal | ICD-10-CM | POA: Diagnosis present

## 2020-05-17 DIAGNOSIS — Z90710 Acquired absence of both cervix and uterus: Secondary | ICD-10-CM

## 2020-05-17 DIAGNOSIS — E890 Postprocedural hypothyroidism: Secondary | ICD-10-CM | POA: Diagnosis present

## 2020-05-17 DIAGNOSIS — I5043 Acute on chronic combined systolic (congestive) and diastolic (congestive) heart failure: Secondary | ICD-10-CM | POA: Diagnosis present

## 2020-05-17 DIAGNOSIS — Z20822 Contact with and (suspected) exposure to covid-19: Secondary | ICD-10-CM | POA: Diagnosis present

## 2020-05-17 DIAGNOSIS — N179 Acute kidney failure, unspecified: Secondary | ICD-10-CM | POA: Diagnosis not present

## 2020-05-17 DIAGNOSIS — J811 Chronic pulmonary edema: Secondary | ICD-10-CM | POA: Diagnosis not present

## 2020-05-17 DIAGNOSIS — M62838 Other muscle spasm: Secondary | ICD-10-CM | POA: Diagnosis not present

## 2020-05-17 DIAGNOSIS — Z7982 Long term (current) use of aspirin: Secondary | ICD-10-CM

## 2020-05-17 DIAGNOSIS — N1831 Chronic kidney disease, stage 3a: Secondary | ICD-10-CM | POA: Diagnosis present

## 2020-05-17 DIAGNOSIS — J810 Acute pulmonary edema: Secondary | ICD-10-CM | POA: Diagnosis not present

## 2020-05-17 DIAGNOSIS — E039 Hypothyroidism, unspecified: Secondary | ICD-10-CM | POA: Diagnosis present

## 2020-05-17 DIAGNOSIS — J90 Pleural effusion, not elsewhere classified: Secondary | ICD-10-CM | POA: Diagnosis not present

## 2020-05-17 DIAGNOSIS — Z881 Allergy status to other antibiotic agents status: Secondary | ICD-10-CM

## 2020-05-17 DIAGNOSIS — E1165 Type 2 diabetes mellitus with hyperglycemia: Secondary | ICD-10-CM | POA: Diagnosis present

## 2020-05-17 DIAGNOSIS — Z8616 Personal history of COVID-19: Secondary | ICD-10-CM

## 2020-05-17 DIAGNOSIS — Z833 Family history of diabetes mellitus: Secondary | ICD-10-CM

## 2020-05-17 DIAGNOSIS — I2510 Atherosclerotic heart disease of native coronary artery without angina pectoris: Secondary | ICD-10-CM | POA: Diagnosis present

## 2020-05-17 DIAGNOSIS — R0602 Shortness of breath: Secondary | ICD-10-CM | POA: Diagnosis not present

## 2020-05-17 DIAGNOSIS — Z6841 Body Mass Index (BMI) 40.0 and over, adult: Secondary | ICD-10-CM

## 2020-05-17 DIAGNOSIS — Z886 Allergy status to analgesic agent status: Secondary | ICD-10-CM

## 2020-05-17 DIAGNOSIS — Z79891 Long term (current) use of opiate analgesic: Secondary | ICD-10-CM

## 2020-05-17 DIAGNOSIS — G4733 Obstructive sleep apnea (adult) (pediatric): Secondary | ICD-10-CM | POA: Diagnosis present

## 2020-05-17 DIAGNOSIS — I447 Left bundle-branch block, unspecified: Secondary | ICD-10-CM | POA: Diagnosis present

## 2020-05-17 DIAGNOSIS — E1122 Type 2 diabetes mellitus with diabetic chronic kidney disease: Secondary | ICD-10-CM | POA: Diagnosis present

## 2020-05-17 DIAGNOSIS — T502X5A Adverse effect of carbonic-anhydrase inhibitors, benzothiadiazides and other diuretics, initial encounter: Secondary | ICD-10-CM | POA: Diagnosis not present

## 2020-05-17 DIAGNOSIS — I5041 Acute combined systolic (congestive) and diastolic (congestive) heart failure: Secondary | ICD-10-CM | POA: Diagnosis present

## 2020-05-17 DIAGNOSIS — Z79890 Hormone replacement therapy (postmenopausal): Secondary | ICD-10-CM

## 2020-05-17 DIAGNOSIS — Z794 Long term (current) use of insulin: Secondary | ICD-10-CM

## 2020-05-17 DIAGNOSIS — K219 Gastro-esophageal reflux disease without esophagitis: Secondary | ICD-10-CM | POA: Diagnosis present

## 2020-05-17 DIAGNOSIS — E785 Hyperlipidemia, unspecified: Secondary | ICD-10-CM | POA: Diagnosis present

## 2020-05-17 DIAGNOSIS — E782 Mixed hyperlipidemia: Secondary | ICD-10-CM | POA: Diagnosis present

## 2020-05-17 DIAGNOSIS — IMO0002 Reserved for concepts with insufficient information to code with codable children: Secondary | ICD-10-CM | POA: Diagnosis present

## 2020-05-17 DIAGNOSIS — M25561 Pain in right knee: Secondary | ICD-10-CM

## 2020-05-17 DIAGNOSIS — I517 Cardiomegaly: Secondary | ICD-10-CM | POA: Diagnosis not present

## 2020-05-17 DIAGNOSIS — Z885 Allergy status to narcotic agent status: Secondary | ICD-10-CM

## 2020-05-17 DIAGNOSIS — I10 Essential (primary) hypertension: Secondary | ICD-10-CM | POA: Diagnosis present

## 2020-05-17 DIAGNOSIS — H409 Unspecified glaucoma: Secondary | ICD-10-CM | POA: Diagnosis present

## 2020-05-17 DIAGNOSIS — Z87891 Personal history of nicotine dependence: Secondary | ICD-10-CM

## 2020-05-17 DIAGNOSIS — M79604 Pain in right leg: Secondary | ICD-10-CM

## 2020-05-17 DIAGNOSIS — M25569 Pain in unspecified knee: Secondary | ICD-10-CM

## 2020-05-17 DIAGNOSIS — J181 Lobar pneumonia, unspecified organism: Secondary | ICD-10-CM | POA: Diagnosis not present

## 2020-05-17 DIAGNOSIS — Z7902 Long term (current) use of antithrombotics/antiplatelets: Secondary | ICD-10-CM

## 2020-05-17 DIAGNOSIS — I959 Hypotension, unspecified: Secondary | ICD-10-CM | POA: Diagnosis not present

## 2020-05-17 DIAGNOSIS — Z8673 Personal history of transient ischemic attack (TIA), and cerebral infarction without residual deficits: Secondary | ICD-10-CM

## 2020-05-17 DIAGNOSIS — Z79899 Other long term (current) drug therapy: Secondary | ICD-10-CM

## 2020-05-17 DIAGNOSIS — Z955 Presence of coronary angioplasty implant and graft: Secondary | ICD-10-CM

## 2020-05-17 DIAGNOSIS — E1159 Type 2 diabetes mellitus with other circulatory complications: Secondary | ICD-10-CM | POA: Diagnosis present

## 2020-05-17 DIAGNOSIS — M7989 Other specified soft tissue disorders: Secondary | ICD-10-CM | POA: Diagnosis not present

## 2020-05-17 LAB — BASIC METABOLIC PANEL
Anion gap: 10 (ref 5–15)
BUN: 22 mg/dL (ref 8–23)
CO2: 26 mmol/L (ref 22–32)
Calcium: 9 mg/dL (ref 8.9–10.3)
Chloride: 105 mmol/L (ref 98–111)
Creatinine, Ser: 1.21 mg/dL — ABNORMAL HIGH (ref 0.44–1.00)
GFR calc Af Amer: 56 mL/min — ABNORMAL LOW (ref >60)
GFR calc non Af Amer: 48 mL/min — ABNORMAL LOW (ref >60)
Glucose, Bld: 134 mg/dL — ABNORMAL HIGH (ref 70–99)
Potassium: 4.1 mmol/L (ref 3.5–5.1)
Sodium: 141 mmol/L (ref 135–145)

## 2020-05-17 LAB — TROPONIN I (HIGH SENSITIVITY): Troponin I (High Sensitivity): 9 ng/L (ref <18)

## 2020-05-17 LAB — CBC
HCT: 40.8 % (ref 36.0–46.0)
Hemoglobin: 12.8 g/dL (ref 12.0–15.0)
MCH: 28.9 pg (ref 26.0–34.0)
MCHC: 31.4 g/dL (ref 30.0–36.0)
MCV: 92.1 fL (ref 80.0–100.0)
Platelets: 237 10*3/uL (ref 150–400)
RBC: 4.43 MIL/uL (ref 3.87–5.11)
RDW: 13.6 % (ref 11.5–15.5)
WBC: 7.4 10*3/uL (ref 4.0–10.5)
nRBC: 0 % (ref 0.0–0.2)

## 2020-05-17 LAB — BRAIN NATRIURETIC PEPTIDE: B Natriuretic Peptide: 132 pg/mL — ABNORMAL HIGH (ref 0.0–100.0)

## 2020-05-17 MED ORDER — FUROSEMIDE 10 MG/ML IJ SOLN
60.0000 mg | Freq: Once | INTRAMUSCULAR | Status: DC
  Filled 2020-05-17: qty 6

## 2020-05-17 NOTE — ED Triage Notes (Signed)
Pt with swelling for bilateral feet for past two weeks, sob since yesterday.   Pt seen PCP 2 weeks ago. Had US done and pt is waiting for her results.

## 2020-05-17 NOTE — ED Provider Notes (Signed)
Emergency Department Provider Note   I have reviewed the triage vital signs and the nursing notes.   HISTORY  Chief Complaint Shortness of Breath   HPI Maureen Jackson is a 64 y.o. female with past medical history reviewed below including CHF (with 2020 ECHO showing EF 35-40%) presents to the emergency department with several weeks of lower extremity swelling.  The patient had been following with her primary care doctor and went to see them regarding cramping pain in the right calf along with swelling.  She had a DVT ultrasound with negative results in our system.  Over the past couple days she is felt short of breath without chest pain.  Her shortness of breath is with exertion only and improved with rest.  She denies any fevers, cough, chills.  No vomiting or diarrhea.  She states that her primary doctor took her off of her spironolactone and Entresto with some kidney injury but then restarted the [place].  She continues to take 40 mg of Lasix daily.   Past Medical History:  Diagnosis Date  . 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 Bleeding from mouth    when she brushed teeth or ate  . Chronic systolic CHF (congestive heart failure) ([place])   . Coronary artery disease 09/2017   a. multivessel CAD by cath in 09/2017 and not felt to be a CABG candidate --> underwent two-vessel PCI with DES to the LAD and DES to the RCA  . Diabetes mellitus   . Dyspnea   . Glaucoma   . Hypertension   . Left bundle branch block   . Morbid obesity ([place])   . Obstructive sleep apnea    does not wear CPAP  . Thyroid disease     Patient Active Problem List   Diagnosis Date Noted  . Acute on chronic combined systolic (congestive) and diastolic (congestive) heart failure ([place]) 05/18/2020  . Acute respiratory disease due to COVID-19 virus 12/26/2019  . Pressure injury of skin 09/21/2019  . Drug rash   . Abscess   . Fistula   . Fournier's gangrene in female   . Chest pain 07/16/2018  . Hyperlipidemia  04/10/2018  . TIA (transient ischemic attack) 03/14/2018  . Vertigo 03/14/2018  . Chronic combined systolic and diastolic CHF (congestive heart failure) (EF 30 to 35 %) 03/14/2018  . Ischemic cardiomyopathy 11/06/2017  . CAD, multiple vessel   . Pericardial effusion 09/07/2017  . Acute combined systolic (congestive) and diastolic (congestive) heart failure ([place]) 09/06/2017  . Cardiomegaly 09/05/2017  . Morbid obesity/BMI > 55 03/04/2013  . Labyrinthitis 03/04/2013  . Diabetes mellitus type 2 with complications, uncontrolled ([place]) 03/04/2013  . GASTRITIS 12/13/2006  . HIATAL HERNIA, HX OF 12/13/2006  . THYROIDECTOMY, HX OF 12/13/2006  . Hypothyroidism 10/04/2006  . TOBACCO ABUSE 10/04/2006  . CARPAL TUNNEL SYNDROME 10/04/2006  . Essential hypertension 10/04/2006  . GERD 10/04/2006  . POSTMENOPAUSAL STATUS 10/04/2006  . SKIN TAG 10/04/2006  . KNEE PAIN, LEFT 10/04/2006  . Sleep apnea 10/04/2006  . LEG EDEMA 10/04/2006    Past Surgical History:  Procedure Laterality Date  . ABDOMINAL HYSTERECTOMY    . CARDIAC CATHETERIZATION  09/12/2017  . CORONARY STENT INTERVENTION  09/12/2017   STENT RESOLUTE ONYX 2.35T73 drug eluting stent was successfully placed  . CORONARY STENT INTERVENTION N/A 09/12/2017   Procedure: CORONARY STENT INTERVENTION;  Surgeon: Leonie Man, MD;  Location: [facility];  Service: Cardiovascular;  Laterality: N/A;  .  INCISION AND DRAINAGE PERIRECTAL ABSCESS Left 09/16/2019   Procedure: IRRIGATION AND DEBRIDEMENT LABIA ABSCESS;  Surgeon: Coralie Keens, MD;  Location[facility];  Service: General;  Laterality: Left;  . IR FLUORO GUIDE CV LINE RIGHT  09/16/2019  . IR US GUIDE VASC ACCESS RIGHT  09/16/2019  . LEFT HEART CATH AND CORONARY ANGIOGRAPHY N/A 09/12/2017   Procedure: LEFT HEART CATH AND CORONARY ANGIOGRAPHY;  Surgeon: Leonie Man, MD;  Location: [facility];  Service: Cardiovascular;  Laterality: N/A;  . MASS EXCISION N/A 12/18/2018    Procedure: EXCISION TONGUE MASS;  Surgeon: Leta Baptist, MD;  Location: [facility];  Service: ENT;  Laterality: N/A;  . RIGHT/LEFT HEART CATH AND CORONARY ANGIOGRAPHY N/A 09/10/2017   Procedure: RIGHT/LEFT HEART CATH AND CORONARY ANGIOGRAPHY;  Surgeon: Troy Sine, MD;  Location: [facility];  Service: Cardiovascular;  Laterality: N/A;  . THYROID SURGERY      Allergies Ampicillin, Hydrocodone, Aspirin, and Unasyn [ampicillin-sulbactam sodium]  Family History  Problem Relation Age of [place]  . Diabetes Mother   . Hypertension Mother   . Cancer Mother        pancreas  . Hypertension Sister     Social History Social History   Tobacco Use  . Smoking status: Former Smoker    Quit date: 12/14/2005    Years since quitting: 14.4  . Smokeless tobacco: Never Used  . Tobacco comment: QUIT IN 2005  Substance Use Topics  . Alcohol use: No  . Drug use: No    Review of Systems  Constitutional: No fever/chills Eyes: No visual changes. ENT: No sore throat. Cardiovascular: Denies chest pain. Positive bilateral LE edema with right leg pain.  Respiratory: Positive shortness of breath. Gastrointestinal: No abdominal pain.  No nausea, no vomiting.  No diarrhea.  No constipation. Genitourinary: Negative for dysuria. Musculoskeletal: Negative for back pain. Skin: Negative for rash. Neurological: Negative for headaches, focal weakness or numbness.  10-point ROS otherwise negative.  ____________________________________________   PHYSICAL EXAM:  VITAL SIGNS: ED Triage Vitals  Enc Vitals Group     BP 05/17/20 1804 (!) 142/83     Pulse Rate 05/17/20 1804 82     Resp 05/17/20 1804 (!) 22     Temp 05/17/20 1804 98.4 F (36.9 C)     Temp Source 05/17/20 1804 Oral     SpO2 05/17/20 1804 94 %     Weight 05/17/20 1805 (!) 335 lb (152 kg)     Height 05/17/20 1805 5\' 5"  (1.651 m)   Constitutional: Alert and oriented. Well appearing and in no acute distress. Eyes: Conjunctivae are normal.    Head: Atraumatic. Nose: No congestion/rhinnorhea. Mouth/Throat: Mucous membranes are moist.   Neck: No stridor.  Cardiovascular: Normal rate, regular rhythm. Good peripheral circulation. Grossly normal heart sounds.   Respiratory: Normal respiratory effort.  No retractions. Lungs with crackles at the bases.  Gastrointestinal: Soft and nontender. No distention.  Musculoskeletal: No lower extremity tenderness nor with 2+ pitting edema. No gross deformities of extremities. Neurologic:  Normal speech and language.  Skin:  Skin is warm, dry and intact. No rash noted.  ____________________________________________   LABS (all labs ordered are listed, but only abnormal results are displayed)  Labs Reviewed  BRAIN NATRIURETIC PEPTIDE - Abnormal; Notable for the following components:      Result Value   B Natriuretic Peptide 132.0 (*)    All other components within normal limits  BASIC METABOLIC PANEL - Abnormal; Notable for the  following components:   Glucose, Bld 134 (*)    Creatinine, Ser 1.21 (*)    GFR calc non Af Amer 48 (*)    GFR calc Af Amer 56 (*)    All other components within normal limits  DIGOXIN LEVEL - Abnormal; Notable for the following components:   Digoxin Level <0.2 (*)    All other components within normal limits  SARS CORONAVIRUS 2 BY RT PCR (HOSPITAL ORDER, [facility])  CBC  MAGNESIUM  GLUCOSE, CAPILLARY  CBG MONITORING, ED  TROPONIN I (HIGH SENSITIVITY)  TROPONIN I (HIGH SENSITIVITY)   ____________________________________________  EKG   EKG Interpretation  Date/Time:  Tuesday May 18 2020 00:42:39 EDT Ventricular Rate:  81 PR Interval:    QRS Duration: 157 QT Interval:  433 QTC Calculation: 503 R Axis:   78 Text Interpretation: Sinus rhythm Left bundle branch block When compared with ECG of 12/26/2019, No significant change was found Confirmed by Delora Fuel ([id number]) on 05/18/2020 4:09:50 AM        ____________________________________________  RADIOLOGY  US Venous Img Lower Unilateral Right (DVT)  Result Date: 05/18/2020 CLINICAL DATA:  Right lower extremity pain and edema. EXAM: RIGHT LOWER EXTREMITY VENOUS DOPPLER ULTRASOUND TECHNIQUE: Gray-scale sonography with graded compression, as well as color Doppler and duplex ultrasound were performed to evaluate the lower extremity deep venous systems from the level of the common femoral vein and including the common femoral, femoral, profunda femoral, popliteal and calf veins including the posterior tibial, peroneal and gastrocnemius veins when visible. The superficial great saphenous vein was also interrogated. Spectral Doppler was utilized to evaluate flow at rest and with distal augmentation maneuvers in the common femoral, femoral and popliteal veins. COMPARISON:  None. FINDINGS: Contralateral Common Femoral Vein: Respiratory phasicity is normal and symmetric with the symptomatic side. No evidence of thrombus. Normal compressibility. Common Femoral Vein: No evidence of thrombus. Normal compressibility, respiratory phasicity and response to augmentation. Saphenofemoral Junction: No evidence of thrombus. Normal compressibility and flow on color Doppler imaging. Profunda Femoral Vein: No evidence of thrombus. Normal compressibility and flow on color Doppler imaging. Femoral Vein: No evidence of thrombus. Normal compressibility, respiratory phasicity and response to augmentation. Popliteal Vein: No evidence of thrombus. Normal compressibility, respiratory phasicity and response to augmentation. Calf Veins: No evidence of thrombus. Normal compressibility and flow on color Doppler imaging. Superficial Great Saphenous Vein: No evidence of thrombus. Normal compressibility. Venous Reflux:  None. Other Findings: No evidence of superficial thrombophlebitis or abnormal fluid collection. IMPRESSION: No evidence of right lower extremity deep venous thrombosis.  Electronically Signed   By: Aletta Edouard M.D.   On: 05/18/2020 12:19   DG Chest Port 1 View  Result Date: 05/17/2020 CLINICAL DATA:  Shortness of breath and leg swelling EXAM: PORTABLE CHEST 1 VIEW COMPARISON:  Radiograph 12/26/2019 FINDINGS: Underpenetration of the image. Hazy basilar and perihilar predominant opacities with central vascular congestion and marked cardiomegaly suggesting pulmonary edema. No pneumothorax or visible effusion. No acute osseous or soft tissue abnormality. Degenerative changes are present in the imaged spine and shoulders. IMPRESSION: Features compatible with CHF/volume overload with pulmonary edema and marked cardiomegaly. Electronically Signed   By: Lovena Le M.D.   On: 05/17/2020 22:58   ECHOCARDIOGRAM COMPLETE  Result Date: 05/18/2020    ECHOCARDIOGRAM REPORT   Patient Name:   Maureen Jackson Date of Exam: 05/18/2020 Medical Rec #:  [id number]       Height:       65.0 in  Accession #:    [id number]      Weight:       335.0 lb Date of Birth:  [birth year]        BSA:          2.463 m Patient Age:    43 years        BP:           128/71 mmHg Patient Gender: F               HR:           78 bpm. Exam Location:  [facility] Procedure: 2D Echo Indications:    CHF-Acute Systolic 347.42 / V95.63  History:        Patient has prior history of Echocardiogram examinations, most                 recent 12/16/2018. CAD, TIA, Signs/Symptoms:Chest Pain; Risk                 Factors:Hypertension, Former Smoker, Diabetes and Dyslipidemia.  Sonographer:    Leavy Cella RDCS (AE) Referring Phys: [place]  1. Images are limited. Suggest Definity contrast when adequate IV access available.  2. Left ventricular ejection fraction, by estimation, is approximately 30%. The left ventricle has severely decreased function. Left ventricular endocardial border not optimally defined to evaluate regional wall motion. There is moderate left ventricular hypertrophy. Left ventricular  diastolic parameters are indeterminate.  3. Right ventricular systolic function is normal. The right ventricular size is normal.  4. Moderate pericardial effusion. The pericardial effusion is circumferential.  5. The mitral valve is grossly normal. Trivial mitral valve regurgitation.  6. The aortic valve is tricuspid. Aortic valve regurgitation is not visualized.  7. The inferior vena cava is normal in size with greater than 50% respiratory variability, suggesting right atrial pressure of 3 mmHg. FINDINGS  Left Ventricle: Left ventricular ejection fraction, by estimation, is 30%. The left ventricle has severely decreased function. Left ventricular endocardial border not optimally defined to evaluate regional wall motion. The left ventricular internal cavity size was normal in size. There is moderate left ventricular hypertrophy. Left ventricular diastolic parameters are indeterminate. Right Ventricle: The right ventricular size is normal. No increase in right ventricular wall thickness. Right ventricular systolic function is normal. Left Atrium: Left atrial size was normal in size. Right Atrium: Right atrial size was normal in size. Pericardium: A moderately sized pericardial effusion is present. The pericardial effusion is circumferential. Mitral Valve: The mitral valve is grossly normal. Trivial mitral valve regurgitation. Tricuspid Valve: The tricuspid valve is not well visualized. Tricuspid valve regurgitation is trivial. Aortic Valve: The aortic valve is tricuspid. Aortic valve regurgitation is not visualized. Mild aortic valve annular calcification. Pulmonic Valve: The pulmonic valve was not well visualized. Pulmonic valve regurgitation is not visualized. Aorta: The aortic root is normal in size and structure. Venous: The inferior vena cava is normal in size with greater than 50% respiratory variability, suggesting right atrial pressure of 3 mmHg. IAS/Shunts: No atrial level shunt detected by color flow  Doppler.  LEFT VENTRICLE PLAX 2D LVIDd:         5.38 cm  Diastology LVIDs:         3.57 cm  LV e' lateral:   4.05 cm/s LV PW:         1.69 cm  LV E/e' lateral: 15.6 LV IVS:        1.47 cm  LV e' medial:  3.98 cm/s LVOT diam:     2.10 cm  LV E/e' medial:  15.9 LVOT Area:     3.46 cm  RIGHT VENTRICLE RV S prime:     8.50 cm/s TAPSE (M-mode): 1.9 cm LEFT ATRIUM           Index       RIGHT ATRIUM           Index LA diam:      3.80 cm 1.54 cm/m  RA Area:     18.30 cm LA Vol (A2C): 48.1 ml 19.53 ml/m RA Volume:   56.10 ml  22.78 ml/m LA Vol (A4C): 63.4 ml 25.74 ml/m   AORTA Ao Root diam: 2.70 cm MITRAL VALVE MV Area (PHT): 4.39 cm    SHUNTS MV Decel Time: 173 msec    Systemic Diam: 2.10 cm MV E velocity: 63.10 cm/s MV A velocity: 61.10 cm/s MV E/A ratio:  1.03 Rozann Lesches MD Electronically signed by Rozann Lesches MD Signature Date/Time: 05/18/2020/12:55:38 PM    Final    [place] EKG SITE RITE  Result Date: 05/18/2020 If Site Rite image not attached, placement could not be confirmed due to current cardiac rhythm.   ____________________________________________   PROCEDURES  Procedure(s) performed:   Procedures  CRITICAL CARE Performed by: Margette Fast Total critical care time: 35 minutes Critical care time was exclusive of separately billable procedures and treating other patients. Critical care was necessary to treat or prevent imminent or life-threatening deterioration. Critical care was time spent personally by me on the following activities: development of treatment plan with patient and/or surrogate as well as nursing, discussions with consultants, evaluation of patient's response to treatment, examination of patient, obtaining history from patient or surrogate, ordering and performing treatments and interventions, ordering and review of laboratory studies, ordering and review of radiographic studies, pulse oximetry and re-evaluation of patient's condition.  Nanda Quinton, MD Emergency  Medicine  ____________________________________________   INITIAL IMPRESSION / ASSESSMENT AND PLAN / ED COURSE  Pertinent labs & imaging results that were available during my care of the patient were reviewed by me and considered in my medical decision making (see chart for details).   Patient presents emergency department with shortness of breath over the past 2 days in the setting of lower extremity swelling.  Pain in the right leg but DVT ultrasound recently which was negative for DVT.  She has palpable pulses.  Considered claudication type pain but less likely given palpable pulses.  No evidence of cellulitis.  The extremities to me appear bilaterally edematous and equal in size.  She has crackles at the bases.  No desaturation with ambulation here in the emergency department or at rest but does become short of breath very quickly with increased work of breathing which improves with rest.   Chest x-ray reviewed showing pulmonary edema type pattern with significant cardiomegaly.  Patient with creatinine which is only slightly elevated.  Given her dyspnea with ambulation feel she would benefit from IV Lasix in a monitored setting with repeat labs in the morning.   Discussed patient's case with [facility] to request admission. Patient and family (if present) updated with plan. Care transferred to [facility] service.  I reviewed all nursing notes, vitals, pertinent old records, EKGs, labs, imaging (as available).  ____________________________________________  FINAL CLINICAL IMPRESSION(S) / ED DIAGNOSES  Final diagnoses:  SOB (shortness of breath)  Acute pulmonary edema (HCC)     MEDICATIONS GIVEN DURING THIS VISIT:  Medications  aspirin EC tablet  81 mg (81 mg Oral Given 05/18/20 0851)  traMADol (ULTRAM) tablet 50 mg (50 mg Oral Given 05/18/20 0624)  atorvastatin (LIPITOR) tablet 80 mg (has no administration in time range)  carvedilol (COREG) tablet 6.25 mg (6.25 mg Oral Given 05/18/20 0849)    sacubitril-valsartan (ENTRESTO) 97-103 mg per tablet (1 tablet Oral Not Given 05/18/20 1245)  empagliflozin (JARDIANCE) tablet 10 mg (10 mg Oral Not Given 05/18/20 1246)  insulin aspart (novoLOG) injection 18 Units (18 Units Subcutaneous Not Given 05/18/20 1245)  insulin glargine (LANTUS) injection 60 Units (has no administration in time range)  ondansetron (ZOFRAN-ODT) disintegrating tablet 4 mg (4 mg Oral Given 05/18/20 0851)  clopidogrel (PLAVIX) tablet 75 mg (75 mg Oral Given 05/18/20 0851)  methocarbamol (ROBAXIN) tablet 500 mg (has no administration in time range)  albuterol (PROVENTIL) (2.5 MG/3ML) 0.083% nebulizer solution 2.5 mg (has no administration in time range)  timolol (TIMOPTIC) 0.5 % ophthalmic solution 1 drop (1 drop Both Eyes Not Given 05/18/20 0209)  sodium chloride flush (NS) 0.9 % injection 3 mL (3 mLs Intravenous Not Given 05/18/20 1246)  sodium chloride flush (NS) 0.9 % injection 3 mL (has no administration in time range)  0.9 %  sodium chloride infusion (has no administration in time range)  acetaminophen (TYLENOL) tablet 650 mg (650 mg Oral Given 05/18/20 0848)  ondansetron (ZOFRAN) injection 4 mg (has no administration in time range)  potassium chloride SA (KLOR-CON) CR tablet 10 mEq (has no administration in time range)  enoxaparin (LOVENOX) injection 75 mg (has no administration in time range)  furosemide (LASIX) tablet 60 mg (has no administration in time range)  levothyroxine (SYNTHROID) tablet 200 mcg (has no administration in time range)     Note:  This document was prepared using Dragon voice recognition software and may include unintentional dictation errors.  Nanda Quinton, MD, [facility] Emergency Medicine    Kennieth Plotts, [facility], MD 05/18/20 [phone]

## 2020-05-17 NOTE — ED Notes (Signed)
Ambulated. o2 96-98% no lightheadedness or dizziness

## 2020-05-18 ENCOUNTER — Inpatient Hospital Stay (HOSPITAL_COMMUNITY): Payer: Medicare HMO

## 2020-05-18 ENCOUNTER — Encounter (HOSPITAL_COMMUNITY): Payer: Self-pay | Admitting: Internal Medicine

## 2020-05-18 ENCOUNTER — Inpatient Hospital Stay: Payer: Self-pay

## 2020-05-18 ENCOUNTER — Other Ambulatory Visit: Payer: Self-pay

## 2020-05-18 DIAGNOSIS — F329 Major depressive disorder, single episode, unspecified: Secondary | ICD-10-CM | POA: Diagnosis present

## 2020-05-18 DIAGNOSIS — R600 Localized edema: Secondary | ICD-10-CM | POA: Diagnosis not present

## 2020-05-18 DIAGNOSIS — I255 Ischemic cardiomyopathy: Secondary | ICD-10-CM | POA: Diagnosis not present

## 2020-05-18 DIAGNOSIS — E1165 Type 2 diabetes mellitus with hyperglycemia: Secondary | ICD-10-CM | POA: Diagnosis not present

## 2020-05-18 DIAGNOSIS — Z20822 Contact with and (suspected) exposure to covid-19: Secondary | ICD-10-CM | POA: Diagnosis not present

## 2020-05-18 DIAGNOSIS — N182 Chronic kidney disease, stage 2 (mild): Secondary | ICD-10-CM | POA: Diagnosis not present

## 2020-05-18 DIAGNOSIS — I5021 Acute systolic (congestive) heart failure: Secondary | ICD-10-CM

## 2020-05-18 DIAGNOSIS — Z8616 Personal history of COVID-19: Secondary | ICD-10-CM | POA: Diagnosis not present

## 2020-05-18 DIAGNOSIS — Z6841 Body Mass Index (BMI) 40.0 and over, adult: Secondary | ICD-10-CM | POA: Diagnosis not present

## 2020-05-18 DIAGNOSIS — E063 Autoimmune thyroiditis: Secondary | ICD-10-CM | POA: Diagnosis not present

## 2020-05-18 DIAGNOSIS — I130 Hypertensive heart and chronic kidney disease with heart failure and stage 1 through stage 4 chronic kidney disease, or unspecified chronic kidney disease: Secondary | ICD-10-CM | POA: Diagnosis not present

## 2020-05-18 DIAGNOSIS — Z8673 Personal history of transient ischemic attack (TIA), and cerebral infarction without residual deficits: Secondary | ICD-10-CM | POA: Diagnosis not present

## 2020-05-18 DIAGNOSIS — I5041 Acute combined systolic (congestive) and diastolic (congestive) heart failure: Secondary | ICD-10-CM | POA: Diagnosis not present

## 2020-05-18 DIAGNOSIS — E119 Type 2 diabetes mellitus without complications: Secondary | ICD-10-CM

## 2020-05-18 DIAGNOSIS — N1831 Chronic kidney disease, stage 3a: Secondary | ICD-10-CM | POA: Diagnosis not present

## 2020-05-18 DIAGNOSIS — I2510 Atherosclerotic heart disease of native coronary artery without angina pectoris: Secondary | ICD-10-CM

## 2020-05-18 DIAGNOSIS — E039 Hypothyroidism, unspecified: Secondary | ICD-10-CM | POA: Diagnosis not present

## 2020-05-18 DIAGNOSIS — Z833 Family history of diabetes mellitus: Secondary | ICD-10-CM | POA: Diagnosis not present

## 2020-05-18 DIAGNOSIS — E1122 Type 2 diabetes mellitus with diabetic chronic kidney disease: Secondary | ICD-10-CM | POA: Diagnosis present

## 2020-05-18 DIAGNOSIS — E118 Type 2 diabetes mellitus with unspecified complications: Secondary | ICD-10-CM

## 2020-05-18 DIAGNOSIS — J810 Acute pulmonary edema: Secondary | ICD-10-CM | POA: Diagnosis not present

## 2020-05-18 DIAGNOSIS — E038 Other specified hypothyroidism: Secondary | ICD-10-CM | POA: Diagnosis not present

## 2020-05-18 DIAGNOSIS — I70223 Atherosclerosis of native arteries of extremities with rest pain, bilateral legs: Secondary | ICD-10-CM | POA: Diagnosis not present

## 2020-05-18 DIAGNOSIS — K219 Gastro-esophageal reflux disease without esophagitis: Secondary | ICD-10-CM | POA: Diagnosis not present

## 2020-05-18 DIAGNOSIS — R0602 Shortness of breath: Secondary | ICD-10-CM | POA: Diagnosis not present

## 2020-05-18 DIAGNOSIS — I959 Hypotension, unspecified: Secondary | ICD-10-CM | POA: Diagnosis not present

## 2020-05-18 DIAGNOSIS — M62838 Other muscle spasm: Secondary | ICD-10-CM | POA: Diagnosis not present

## 2020-05-18 DIAGNOSIS — I5043 Acute on chronic combined systolic (congestive) and diastolic (congestive) heart failure: Secondary | ICD-10-CM | POA: Diagnosis present

## 2020-05-18 DIAGNOSIS — H409 Unspecified glaucoma: Secondary | ICD-10-CM | POA: Diagnosis present

## 2020-05-18 DIAGNOSIS — I10 Essential (primary) hypertension: Secondary | ICD-10-CM

## 2020-05-18 DIAGNOSIS — M25561 Pain in right knee: Secondary | ICD-10-CM | POA: Diagnosis not present

## 2020-05-18 DIAGNOSIS — E890 Postprocedural hypothyroidism: Secondary | ICD-10-CM | POA: Diagnosis not present

## 2020-05-18 DIAGNOSIS — G4733 Obstructive sleep apnea (adult) (pediatric): Secondary | ICD-10-CM | POA: Diagnosis present

## 2020-05-18 DIAGNOSIS — E785 Hyperlipidemia, unspecified: Secondary | ICD-10-CM | POA: Diagnosis not present

## 2020-05-18 DIAGNOSIS — N179 Acute kidney failure, unspecified: Secondary | ICD-10-CM | POA: Diagnosis not present

## 2020-05-18 DIAGNOSIS — I447 Left bundle-branch block, unspecified: Secondary | ICD-10-CM | POA: Diagnosis present

## 2020-05-18 LAB — DIGOXIN LEVEL: Digoxin Level: <0.2 ng/mL — ABNORMAL LOW (ref 0.8–2.0)

## 2020-05-18 LAB — GLUCOSE, CAPILLARY
Glucose-Capillary: 70 mg/dL (ref 70–99)
Glucose-Capillary: 118 mg/dL — ABNORMAL HIGH (ref 70–99)
Glucose-Capillary: 143 mg/dL — ABNORMAL HIGH (ref 70–99)

## 2020-05-18 LAB — CBG MONITORING, ED: Glucose-Capillary: 98 mg/dL (ref 70–99)

## 2020-05-18 LAB — MAGNESIUM: Magnesium: 2.3 mg/dL (ref 1.7–2.4)

## 2020-05-18 LAB — TROPONIN I (HIGH SENSITIVITY): Troponin I (High Sensitivity): 8 ng/L (ref <18)

## 2020-05-18 LAB — ECHOCARDIOGRAM COMPLETE
Height: 65 in
Weight: 5360 oz

## 2020-05-18 LAB — SARS CORONAVIRUS 2 BY RT PCR (HOSPITAL ORDER, PERFORMED IN ~~LOC~~ HOSPITAL LAB): SARS Coronavirus 2: NEGATIVE

## 2020-05-18 MED ORDER — ONDANSETRON HCL 4 MG/2ML IJ SOLN
4.0000 mg | Freq: Four times a day (QID) | INTRAMUSCULAR | Status: DC | PRN
  Administered 2020-05-19 – 2020-05-24 (×3): 4 mg via INTRAVENOUS
  Filled 2020-05-18 (×3): qty 2

## 2020-05-18 MED ORDER — INSULIN ASPART 100 UNIT/ML ~~LOC~~ SOLN
18.0000 [IU] | Freq: Three times a day (TID) | SUBCUTANEOUS | Status: DC
  Administered 2020-05-18 – 2020-05-25 (×13): 18 [IU] via SUBCUTANEOUS
  Filled 2020-05-18: qty 1

## 2020-05-18 MED ORDER — ATORVASTATIN CALCIUM 40 MG PO TABS
80.0000 mg | ORAL_TABLET | Freq: Every day | ORAL | Status: DC
  Administered 2020-05-18 – 2020-05-24 (×7): 80 mg via ORAL
  Filled 2020-05-18 (×7): qty 2

## 2020-05-18 MED ORDER — METHOCARBAMOL 500 MG PO TABS
500.0000 mg | ORAL_TABLET | Freq: Three times a day (TID) | ORAL | Status: DC | PRN
  Administered 2020-05-18 – 2020-05-24 (×10): 500 mg via ORAL
  Filled 2020-05-18 (×10): qty 1

## 2020-05-18 MED ORDER — ONDANSETRON 4 MG PO TBDP
4.0000 mg | ORAL_TABLET | Freq: Three times a day (TID) | ORAL | Status: DC | PRN
  Administered 2020-05-18: 4 mg via ORAL
  Filled 2020-05-18: qty 1

## 2020-05-18 MED ORDER — FUROSEMIDE 40 MG PO TABS
60.0000 mg | ORAL_TABLET | Freq: Two times a day (BID) | ORAL | Status: DC
  Administered 2020-05-18: 60 mg via ORAL
  Filled 2020-05-18: qty 1

## 2020-05-18 MED ORDER — CLOPIDOGREL BISULFATE 75 MG PO TABS
75.0000 mg | ORAL_TABLET | Freq: Every day | ORAL | Status: DC
  Administered 2020-05-18 – 2020-05-25 (×8): 75 mg via ORAL
  Filled 2020-05-18 (×8): qty 1

## 2020-05-18 MED ORDER — TIMOLOL MALEATE 0.5 % OP SOLN
1.0000 [drp] | Freq: Two times a day (BID) | OPHTHALMIC | Status: DC
  Administered 2020-05-18 – 2020-05-25 (×13): 1 [drp] via OPHTHALMIC
  Filled 2020-05-18 (×4): qty 5

## 2020-05-18 MED ORDER — ASPIRIN EC 81 MG PO TBEC
81.0000 mg | DELAYED_RELEASE_TABLET | Freq: Every day | ORAL | Status: DC
  Administered 2020-05-18 – 2020-05-25 (×8): 81 mg via ORAL
  Filled 2020-05-18 (×8): qty 1

## 2020-05-18 MED ORDER — ENOXAPARIN SODIUM 40 MG/0.4ML ~~LOC~~ SOLN: 40.0000 mg | SUBCUTANEOUS | Status: DC

## 2020-05-18 MED ORDER — SODIUM CHLORIDE 0.9% FLUSH
3.0000 mL | Freq: Two times a day (BID) | INTRAVENOUS | Status: DC
  Administered 2020-05-19 – 2020-05-25 (×10): 3 mL via INTRAVENOUS

## 2020-05-18 MED ORDER — LEVOTHYROXINE SODIUM 100 MCG PO TABS
200.0000 ug | ORAL_TABLET | Freq: Every day | ORAL | Status: DC
  Administered 2020-05-19 – 2020-05-25 (×7): 200 ug via ORAL
  Filled 2020-05-18 (×7): qty 2

## 2020-05-18 MED ORDER — INSULIN GLARGINE 100 UNIT/ML ~~LOC~~ SOLN
60.0000 [IU] | Freq: Every day | SUBCUTANEOUS | Status: DC
  Administered 2020-05-19 – 2020-05-25 (×7): 60 [IU] via SUBCUTANEOUS
  Filled 2020-05-18 (×9): qty 0.6

## 2020-05-18 MED ORDER — CHLORHEXIDINE GLUCONATE CLOTH 2 % EX PADS
6.0000 | MEDICATED_PAD | Freq: Every day | CUTANEOUS | Status: DC
  Administered 2020-05-18 – 2020-05-23 (×5): 6 via TOPICAL

## 2020-05-18 MED ORDER — SODIUM CHLORIDE 0.9% FLUSH
10.0000 mL | Freq: Two times a day (BID) | INTRAVENOUS | Status: DC
  Administered 2020-05-18 – 2020-05-25 (×13): 10 mL

## 2020-05-18 MED ORDER — ENOXAPARIN SODIUM 80 MG/0.8ML ~~LOC~~ SOLN
75.0000 mg | SUBCUTANEOUS | Status: DC
  Administered 2020-05-18 – 2020-05-25 (×8): 75 mg via SUBCUTANEOUS
  Filled 2020-05-18 (×8): qty 0.8

## 2020-05-18 MED ORDER — SODIUM CHLORIDE 0.9% FLUSH: 3.0000 mL | INTRAVENOUS | Status: DC | PRN

## 2020-05-18 MED ORDER — LEVOTHYROXINE SODIUM 50 MCG PO TABS
125.0000 ug | ORAL_TABLET | Freq: Every day | ORAL | Status: DC
  Administered 2020-05-18: 125 ug via ORAL
  Filled 2020-05-18: qty 3

## 2020-05-18 MED ORDER — POTASSIUM CHLORIDE CRYS ER 10 MEQ PO TBCR
10.0000 meq | EXTENDED_RELEASE_TABLET | Freq: Every day | ORAL | Status: DC
  Administered 2020-05-18 – 2020-05-24 (×7): 10 meq via ORAL
  Filled 2020-05-18 (×7): qty 1

## 2020-05-18 MED ORDER — ACETAMINOPHEN 325 MG PO TABS
650.0000 mg | ORAL_TABLET | ORAL | Status: DC | PRN
  Administered 2020-05-18 (×2): 650 mg via ORAL
  Filled 2020-05-18 (×3): qty 2

## 2020-05-18 MED ORDER — SPIRONOLACTONE 25 MG PO TABS: 25.0000 mg | ORAL_TABLET | Freq: Every day | ORAL | Status: DC

## 2020-05-18 MED ORDER — FUROSEMIDE 10 MG/ML IJ SOLN
60.0000 mg | Freq: Two times a day (BID) | INTRAMUSCULAR | Status: DC
  Administered 2020-05-19 – 2020-05-22 (×7): 60 mg via INTRAVENOUS
  Filled 2020-05-18 (×7): qty 6

## 2020-05-18 MED ORDER — FUROSEMIDE 40 MG PO TABS
60.0000 mg | ORAL_TABLET | Freq: Four times a day (QID) | ORAL | Status: DC
  Administered 2020-05-18: 60 mg via ORAL
  Filled 2020-05-18 (×2): qty 2

## 2020-05-18 MED ORDER — SODIUM CHLORIDE 0.9% FLUSH: 10.0000 mL | INTRAVENOUS | Status: DC | PRN

## 2020-05-18 MED ORDER — EMPAGLIFLOZIN 10 MG PO TABS
10.0000 mg | ORAL_TABLET | Freq: Every day | ORAL | Status: DC
  Administered 2020-05-19 – 2020-05-21 (×3): 10 mg via ORAL
  Filled 2020-05-18 (×7): qty 1

## 2020-05-18 MED ORDER — SACUBITRIL-VALSARTAN 97-103 MG PO TABS
1.0000 | ORAL_TABLET | Freq: Two times a day (BID) | ORAL | Status: DC
  Administered 2020-05-18 – 2020-05-24 (×12): 1 via ORAL
  Filled 2020-05-18 (×17): qty 1

## 2020-05-18 MED ORDER — ALBUTEROL SULFATE (2.5 MG/3ML) 0.083% IN NEBU: 2.5000 mg | INHALATION_SOLUTION | Freq: Four times a day (QID) | RESPIRATORY_TRACT | Status: DC | PRN

## 2020-05-18 MED ORDER — FUROSEMIDE 10 MG/ML IJ SOLN: 40.0000 mg | Freq: Four times a day (QID) | INTRAMUSCULAR | Status: DC

## 2020-05-18 MED ORDER — CARVEDILOL 3.125 MG PO TABS
6.2500 mg | ORAL_TABLET | Freq: Two times a day (BID) | ORAL | Status: DC
  Administered 2020-05-18 – 2020-05-25 (×15): 6.25 mg via ORAL
  Filled 2020-05-18 (×11): qty 2
  Filled 2020-05-18: qty 1
  Filled 2020-05-18 (×3): qty 2

## 2020-05-18 MED ORDER — TRAMADOL HCL 50 MG PO TABS
50.0000 mg | ORAL_TABLET | Freq: Four times a day (QID) | ORAL | Status: DC | PRN
  Administered 2020-05-18 – 2020-05-23 (×10): 50 mg via ORAL
  Filled 2020-05-18 (×10): qty 1

## 2020-05-18 MED ORDER — SODIUM CHLORIDE 0.9 % IV SOLN: 250.0000 mL | INTRAVENOUS | Status: DC | PRN

## 2020-05-18 NOTE — H&P (Signed)
History and Physical    Maureen Jackson FIE:[id number] DOB: 03-29-1956 DOA: 05/17/2020  PCP: Neale Burly, MD   Patient coming from: home  I have personally briefly reviewed patient's old medical records in [place]  Chief Complaint: legs are swollen, DOE  HPI: Maureen Jackson is a 64 y.o. female with medical history significant of chronic systolic HF with last LVEF of 35% in 2020, CAD, HTN, morbid obesity. She reports increased leg swelling for several weeks with pain on the right. She did have an outpatient LE venous doppler 04/30/20 negative for DVT. For several days she has had increasing dyspnea on exertion. Entresto and spiranolactone were preveiously stopped 2/2 rise in creatinine but her cardiologist,Dr. Harl Bowie [facility], has restarted these medications. She does not c/o chest pain. Because of these progressive symptoms she presents to [facility] for evaluation.   ED Course: afebrile, VSS. She is noted to have LE edema. She is noted to have marked DOE but maintains her oxygen saturation. Lab studies reveal normal Troponin at 9, BNP 132. CXR read as fluid overload with cardiomegaly. [facility] is called to admit patient for acute on chronic systolic heart failure.   Review of Systems: As per HPI otherwise 10 point review of systems negative.    Past Medical History:  Diagnosis Date  . 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 Bleeding from mouth    when she brushed teeth or ate  . Chronic systolic CHF (congestive heart failure) ([place])   . Coronary artery disease 09/2017   a. multivessel CAD by cath in 09/2017 and not felt to be a CABG candidate --> underwent two-vessel PCI with DES to the LAD and DES to the RCA  . Diabetes mellitus   . Dyspnea   . Glaucoma   . Hypertension   . Left bundle branch block   . Morbid obesity ([place])   . Obstructive sleep apnea    does not wear CPAP  . Thyroid disease     Past Surgical History:  Procedure Laterality Date  . ABDOMINAL HYSTERECTOMY    .  CARDIAC CATHETERIZATION  09/12/2017  . CORONARY STENT INTERVENTION  09/12/2017   STENT RESOLUTE ONYX 1.66A63 drug eluting stent was successfully placed  . CORONARY STENT INTERVENTION N/A 09/12/2017   Procedure: CORONARY STENT INTERVENTION;  Surgeon: Leonie Man, MD;  Location: [facility];  Service: Cardiovascular;  Laterality: N/A;  . INCISION AND DRAINAGE PERIRECTAL ABSCESS Left 09/16/2019   Procedure: IRRIGATION AND DEBRIDEMENT LABIA ABSCESS;  Surgeon: Coralie Keens, MD;  Location: [facility];  Service: General;  Laterality: Left;  . IR FLUORO GUIDE CV LINE RIGHT  09/16/2019  . IR US GUIDE VASC ACCESS RIGHT  09/16/2019  . LEFT HEART CATH AND CORONARY ANGIOGRAPHY N/A 09/12/2017   Procedure: LEFT HEART CATH AND CORONARY ANGIOGRAPHY;  Surgeon: Leonie Man, MD;  Location: [facility];  Service: Cardiovascular;  Laterality: N/A;  . MASS EXCISION N/A 12/18/2018   Procedure: EXCISION TONGUE MASS;  Surgeon: Leta Baptist, MD;  Location: [facility];  Service: ENT;  Laterality: N/A;  . RIGHT/LEFT HEART CATH AND CORONARY ANGIOGRAPHY N/A 09/10/2017   Procedure: RIGHT/LEFT HEART CATH AND CORONARY ANGIOGRAPHY;  Surgeon: Troy Sine, MD;  Location: [facility];  Service: Cardiovascular;  Laterality: N/A;  . THYROID SURGERY     Soc Hx - widowed. She has a daughter in [place] who does check on her regularly since she live alone. She is retired.  reports that she quit smoking about 14 years ago. She has never used smokeless tobacco. She reports that she does not drink alcohol or use drugs.  Allergies  Allergen Reactions  . Ampicillin Other (See Comments)    "Allergic," per MAR  . Hydrocodone Other (See Comments)    "Allergic," per MAR  . Aspirin Nausea Only  . Unasyn [Ampicillin-Sulbactam Sodium] Rash and Other (See Comments)    "Allergic," per [facility]    Family History  Problem Relation Age of Onset  . Diabetes Mother   . Hypertension Mother   . Cancer Mother        pancreas    . Hypertension Sister      Prior to Admission medications   Medication Sig Start Date End Date Taking? Authorizing Provider  aspirin EC 81 MG tablet Take 81 mg by mouth daily.    [provider]  atorvastatin (LIPITOR) 80 MG tablet TAKE 1 TABLET(80 MG) BY MOUTH DAILY AT 6 PM Patient taking differently: Take 80 mg by mouth daily at 6 PM.  10/17/19   Branch, Alphonse Guild, MD  carvedilol (COREG) 6.25 MG tablet Take 1 tablet (6.25 mg total) by mouth 2 (two) times daily with a meal. 11/03/19   Branch, Alphonse Guild, MD  cetirizine (ZYRTEC) 10 MG tablet Take 10 mg by mouth daily as needed for allergies.  09/12/18   [provider]  clopidogrel (PLAVIX) 75 MG tablet TAKE 1 TABLET(75 MG) BY MOUTH DAILY WITH BREAKFAST Patient taking differently: Take 75 mg by mouth daily.  09/02/19   Arnoldo Lenis, MD  furosemide (LASIX) 40 MG tablet Take 1 tablet (40 mg total) by mouth daily. 09/14/17   Ghimire, Henreitta Leber, MD  JARDIANCE 10 MG TABS tablet Take 10 mg by mouth daily. 10/21/19   [provider]  levothyroxine (SYNTHROID, LEVOTHROID) 125 MCG tablet Take 125 mcg by mouth daily before breakfast.  08/16/18   [provider]  methocarbamol (ROBAXIN) 500 MG tablet Take 1 tablet (500 mg total) by mouth every 8 (eight) hours as needed for muscle spasms. 03/04/20   Davonna Belling, MD  NOVOLOG FLEXPEN 100 UNIT/ML FlexPen Inject 18 Units into the skin 3 (three) times daily with meals. 12/31/19   Caren Griffins, MD  ondansetron (ZOFRAN ODT) 4 MG disintegrating tablet Take 1 tablet (4 mg total) by mouth every 8 (eight) hours as needed. Patient taking differently: Take 4 mg by mouth every 8 (eight) hours as needed for nausea or vomiting.  09/06/19   Fredia Sorrow, MD  sacubitril-valsartan (ENTRESTO) 97-103 MG Take 1 tablet by mouth 2 (two) times daily. 04/14/20   Strader, Fransisco Hertz, PA-C  timolol (TIMOPTIC) 0.5 % ophthalmic solution Place 1 drop into both eyes 2 (two) times daily.  10/07/18   [provider]  traMADol (ULTRAM) 50 MG tablet Take 1 tablet (50 mg total) by mouth every 6 (six) hours as needed for severe pain. Patient taking differently: Take 50 mg by mouth every 6 (six) hours as needed for moderate pain or severe pain.  10/02/19   Dessa Phi, DO  TRESIBA FLEXTOUCH 100 UNIT/ML SOPN FlexTouch Pen Inject 60 Units into the skin daily.  10/08/19   [provider]  VENTOLIN HFA 108 (90 Base) MCG/ACT inhaler Inhale 1-2 puffs into the lungs every 6 (six) hours as needed for wheezing or shortness of breath. Patient taking differently: Inhale 2 puffs into the lungs every 6 (six) hours as needed for wheezing or shortness of breath.  09/14/17   Jonetta Osgood, MD    Physical Exam: Vitals:   05/17/20 1804 05/17/20 1805 05/17/20 2230  BP: (!) 142/83  109/81  Pulse: 82  81  Resp: (!) 22    Temp: 98.4 F (36.9 C)    TempSrc: Oral    SpO2: 94%  98%  Weight:  (!) 152 kg   Height:  5\' 5"  (1.651 m)     Constitutional: NAD, calm, comfortable Vitals:   05/17/20 1804 05/17/20 1805 05/17/20 2230  BP: (!) 142/83  109/81  Pulse: 82  81  Resp: (!) 22    Temp: 98.4 F (36.9 C)    TempSrc: Oral    SpO2: 94%  98%  Weight:  (!) 152 kg   Height:  5\' 5"  (1.651 m)    General:  Morbidly obese woman in no distress, resting comfortably Eyes: PERRL, lids and conjunctivae normal ENMT: Mucous membranes are moist. Posterior pharynx clear of any exudate or lesions.  Neck: obese normal, supple, no masses, no thyromegaly Respiratory: clear to auscultation bilaterally, no wheezing, no crackles. Normal respiratory effort. No accessory muscle use while at rest.  Cardiovascular: Regular rate and rhythm, no murmurs / rubs / gallops. 2+ lower extremity edema. 1+ pedal pulses. No carotid bruits.  Abdomen: morbidly obese - hindering palpation 2/2 girth. no tenderness,. Bowel sounds positive.  Musculoskeletal: no clubbing / cyanosis. No joint deformity upper and  lower extremities. Good ROM, no contractures. Normal muscle tone.  Skin: no rashes, lesions, ulcers. No induration Neurologic: CN 2-12 grossly intact. Sensation intact,  Strength 5/5 in all 4.  Psychiatric: Normal judgment and insight. Alert and oriented x 3. Normal mood.     Labs on Admission: I have personally reviewed following labs and imaging studies  CBC: Recent Labs  Lab 05/17/20 2107  WBC 7.4  HGB 12.8  HCT 40.8  MCV 92.1  PLT 627   Basic Metabolic Panel: Recent Labs  Lab 05/17/20 2107  NA 141  K 4.1  CL 105  CO2 26  GLUCOSE 134*  BUN 22  CREATININE 1.21*  CALCIUM 9.0   GFR: Estimated Creatinine Clearance: 72.3 mL/min (A) (by C-G formula based on SCr of 1.21 mg/dL (H)). Liver Function Tests: No results for input(s): AST, ALT, ALKPHOS, BILITOT, PROT, ALBUMIN in the last 168 hours. No results for input(s): LIPASE, AMYLASE in the last 168 hours. No results for input(s): AMMONIA in the last 168 hours. Coagulation Profile: No results for input(s): INR, PROTIME in the last 168 hours. Cardiac Enzymes: No results for input(s): CKTOTAL, CKMB, CKMBINDEX, TROPONINI in the last 168 hours. BNP (last 3 results) No results for input(s): PROBNP in the last 8760 hours. HbA1C: No results for input(s): HGBA1C in the last 72 hours. CBG: No results for input(s): GLUCAP in the last 168 hours. Lipid Profile: No results for input(s): CHOL, HDL, LDLCALC, TRIG, CHOLHDL, LDLDIRECT in the last 72 hours. Thyroid Function Tests: No results for input(s): TSH, T4TOTAL, FREET4, T3FREE, THYROIDAB in the last 72 hours. Anemia Panel: No results for input(s): VITAMINB12, FOLATE, FERRITIN, TIBC, IRON, RETICCTPCT in the last 72 hours. Urine analysis:    Component Value Date/Time   COLORURINE AMBER (A) 12/26/2019 1709   APPEARANCEUR CLOUDY (A) 12/26/2019 1709   LABSPEC 1.024 12/26/2019 1709   PHURINE 5.0 12/26/2019 1709   GLUCOSEU NEGATIVE 12/26/2019 1709   HGBUR NEGATIVE 12/26/2019  1709   BILIRUBINUR NEGATIVE 12/26/2019 1709   KETONESUR 5 (A) 12/26/2019 1709   PROTEINUR 100 (A) 12/26/2019  1709   UROBILINOGEN 0.2 10/02/2015 2320   NITRITE NEGATIVE 12/26/2019 1709   LEUKOCYTESUR NEGATIVE 12/26/2019 1709    Radiological Exams on Admission: DG Chest Port 1 View  Result Date: 05/17/2020 CLINICAL DATA:  Shortness of breath and leg swelling EXAM: PORTABLE CHEST 1 VIEW COMPARISON:  Radiograph 12/26/2019 FINDINGS: Underpenetration of the image. Hazy basilar and perihilar predominant opacities with central vascular congestion and marked cardiomegaly suggesting pulmonary edema. No pneumothorax or visible effusion. No acute osseous or soft tissue abnormality. Degenerative changes are present in the imaged spine and shoulders. IMPRESSION: Features compatible with CHF/volume overload with pulmonary edema and marked cardiomegaly. Electronically Signed   By: Lovena Le M.D.   On: 05/17/2020 22:58    EKG: Independently reviewed. No EKG done in ED  Assessment/Plan Active Problems:   Acute combined systolic (congestive) and diastolic (congestive) heart failure (H[facility])   Essential hypertension   GERD   Diabetes mellitus type 2 with complications, uncontrolled ([place])   Hypothyroidism   Hyperlipidemia   Acute on chronic combined systolic (congestive) and diastolic (congestive) heart failure ([place])     1. Acute on chronic systolic heart failure - patient with peripheral edema suggestive of diastolic dysfxn and pulmonary edema c/w exacerbation of systolic HF with known EF of 35%. Plan Heart failure admission to telemetry  IV furosemide 40 mg q 6  Potassium supplementation  2D echo  F/u Bmet  Consult Dr. Nelly Laurence office later today  2. HTN- will continue home meds  3. DM - last A1C 8.9 on 12/29/19. Plan Continue home regimen  Will use sliding scale for any additional coverage.  4. GERD - no complaint. Plan H2 blocker therapy while in-patient  5. HLD - continue home  medication  6. Hypothyroidism - last TSH 04/14/20 - 85.399 Plan Will continue present dose of levothyroxine and leave med adjust to PCP  7. Disposition - plan is to return home when medically stable.   DVT prophylaxis: lovenox  Code Status: full code Family Communication: spoke with daughter by phone. She understands Dx and Tx plan. All questions answered Disposition Plan: home when stable 48-72 hrs  Consults called: To call cardiology - Dr. [facility]   Admission status: inpatient/tele    Adella Hare MD [facility] Pager [phone]  If 7PM-7AM, please contact night-coverage [email or web address] Password [facility]  05/18/2020, 12:37 AM

## 2020-05-18 NOTE — ED Notes (Signed)
MD notified that multiple RNs attempted to gain IV access with no success. MD states okay to be admitted with no IV access.

## 2020-05-18 NOTE — Progress Notes (Signed)
Late entry. Patient CBG 70 this afternoon, novolog and lantus held at lunch. CBG 118 this evening, had not eaten. Held novolog. Notified Dr. Manuella Ghazi, stated okay to hold doses.

## 2020-05-18 NOTE — Consult Note (Addendum)
Cardiology Consult    Patient ID: Maureen Jackson; [id number]; 08-Mar-1956   Admit date: 05/17/2020 Date of Consult: 05/18/2020  Primary Care Provider: Neale Burly, MD Primary Cardiologist: Carlyle Dolly, MD   Patient Profile    Maureen Jackson is a 64 y.o. female with past medical history of CAD (s/p DES to LAD and RCA in 09/2017), ischemic cardiomyopathy(EF 30-35% in 09/2017, at 35-40% by repeat in 12/2018), HTN, HLD, Type 2 DM, Stage 2-3 CKD, and history of COVID PNA (admitted in 12/2019) who is being seen today for the evaluation of CHF at the request of Dr. Manuella Ghazi.   History of Present Illness    Maureen Jackson most recently had a virtual visit with Ermalinda Barrios, PA-C on 04/13/2020 and reported episodes of throbbing pain along her right leg and was taking a muscle relaxant as needed. She reported having dyspnea on exertion but denied any lower extremity edema. Entresto had been titrated at her prior visit and it was recommended she have a follow-up BMET and if renal function and electrolytes remained stable, then titrate Entresto to 97-103mg  BID. Follow-up labs showed stable numbers, therefore Entresto was titrated to 97-103mg  BID on 04/14/2020. TSH was elevated to 85.399 and follow-up with her PCP was recommended.   She presented to [facility] ED on 05/17/2020 for evaluation of worsening lower extremity edema for the past 2 weeks and had developed worsening dyspnea the day prior. She reports having lower extremity edema and pain along her lower extremities for the past several weeks and underwent a Doppler study on 04/29/2020 by her PCP which she reports was negative for a DVT.  Over the past few days, she has experienced worsening dyspnea on exertion. She does have baseline 2-3 pillow orthopnea but denies any acute changes in this. No recent chest pain or palpitations. Reports good compliance with her diuretic regimen and denies missing any recent doses (was on Lasix 40mg  daily as an  outpatient). Says weight has been stable on her home scales but she has cut-back on her intake of food. Does not add salt to her food but does consume fast food on occasion.   Initial labs show WBC 7.4, Hgb 12.8, platelets 237, Na+ 141, K+ 4.1 and creatinine 1.21 (baseline 1.1 - 1.2). Mg 2.3. Initial and delta HS Troponin values negativ at 9 and 8. Dig Level < 0.2. BNP 132. COVID negative. CXR consistent with CHF. EKG shows NSR, HR 81 with known LBBB.   By review of notes, multiple attempts were made to establish an IV without success and she was ultimately given PO Lasix 60 mg at 0400 and reports urinating 4-5 times since then.   Past Medical History:  Diagnosis Date  . 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 Bleeding from mouth    when she brushed teeth or ate  . Chronic systolic CHF (congestive heart failure) ([place])   . Coronary artery disease 09/2017   a. multivessel CAD by cath in 09/2017 and not felt to be a CABG candidate --> underwent two-vessel PCI with DES to the LAD and DES to the RCA  . Diabetes mellitus   . Dyspnea   . Glaucoma   . Hypertension   . Left bundle branch block   . Morbid obesity ([place])   . Obstructive sleep apnea    does not wear CPAP  . Thyroid disease     Past Surgical History:  Procedure Laterality Date  . ABDOMINAL HYSTERECTOMY    .  CARDIAC CATHETERIZATION  09/12/2017  . CORONARY STENT INTERVENTION  09/12/2017   STENT RESOLUTE ONYX 8.41L24 drug eluting stent was successfully placed  . CORONARY STENT INTERVENTION N/A 09/12/2017   Procedure: CORONARY STENT INTERVENTION;  Surgeon: Leonie Man, MD;  Location: [place] [facility];  Service: Cardiovascular;  Laterality: N/A;  . INCISION AND DRAINAGE PERIRECTAL ABSCESS Left 09/16/2019   Procedure: IRRIGATION AND DEBRIDEMENT LABIA ABSCESS;  Surgeon: Coralie Keens, MD;  Location: [place];  Service: General;  Laterality: Left;  . IR FLUORO GUIDE CV LINE RIGHT  09/16/2019  . IR US GUIDE VASC ACCESS RIGHT  09/16/2019    . LEFT HEART CATH AND CORONARY ANGIOGRAPHY N/A 09/12/2017   Procedure: LEFT HEART CATH AND CORONARY ANGIOGRAPHY;  Surgeon: Leonie Man, MD;  Location: [place] [facility];  Service: Cardiovascular;  Laterality: N/A;  . MASS EXCISION N/A 12/18/2018   Procedure: EXCISION TONGUE MASS;  Surgeon: Leta Baptist, MD;  Location: [facility];  Service: ENT;  Laterality: N/A;  . RIGHT/LEFT HEART CATH AND CORONARY ANGIOGRAPHY N/A 09/10/2017   Procedure: RIGHT/LEFT HEART CATH AND CORONARY ANGIOGRAPHY;  Surgeon: Troy Sine, MD;  Location: [place] [facility];  Service: Cardiovascular;  Laterality: N/A;  . THYROID SURGERY       Home Medications:  Prior to Admission medications   Medication Sig Start Date End Date Taking? Authorizing Provider  aspirin EC 81 MG tablet Take 81 mg by mouth daily.    [provider]  atorvastatin (LIPITOR) 80 MG tablet TAKE 1 TABLET(80 MG) BY MOUTH DAILY AT 6 PM Patient taking differently: Take 80 mg by mouth daily at 6 PM.  10/17/19   Branch, Alphonse Guild, MD  carvedilol (COREG) 6.25 MG tablet Take 1 tablet (6.25 mg total) by mouth 2 (two) times daily with a meal. 11/03/19   Branch, Alphonse Guild, MD  cetirizine (ZYRTEC) 10 MG tablet Take 10 mg by mouth daily as needed for allergies.  09/12/18   [provider]  clopidogrel (PLAVIX) 75 MG tablet TAKE 1 TABLET(75 MG) BY MOUTH DAILY WITH BREAKFAST Patient taking differently: Take 75 mg by mouth daily.  09/02/19   Arnoldo Lenis, MD  furosemide (LASIX) 40 MG tablet Take 1 tablet (40 mg total) by mouth daily. 09/14/17   Ghimire, Henreitta Leber, MD  JARDIANCE 10 MG TABS tablet Take 10 mg by mouth daily. 10/21/19   [provider]  levothyroxine (SYNTHROID, LEVOTHROID) 125 MCG tablet Take 125 mcg by mouth daily before breakfast.  08/16/18   [provider]  methocarbamol (ROBAXIN) 500 MG tablet Take 1 tablet (500 mg total) by mouth every 8 (eight) hours as needed for muscle spasms. 03/04/20   Davonna Belling, MD   NOVOLOG FLEXPEN 100 UNIT/ML FlexPen Inject 18 Units into the skin 3 (three) times daily with meals. 12/31/19   Caren Griffins, MD  ondansetron (ZOFRAN ODT) 4 MG disintegrating tablet Take 1 tablet (4 mg total) by mouth every 8 (eight) hours as needed. Patient taking differently: Take 4 mg by mouth every 8 (eight) hours as needed for nausea or vomiting.  09/06/19   Fredia Sorrow, MD  sacubitril-valsartan (ENTRESTO) 97-103 MG Take 1 tablet by mouth 2 (two) times daily. 04/14/20   Strader, Fransisco Hertz, PA-C  timolol (TIMOPTIC) 0.5 % ophthalmic solution Place 1 drop into both eyes 2 (two) times daily. 10/07/18   [provider]  traMADol (ULTRAM) 50 MG tablet Take 1 tablet (50 mg total) by mouth every 6 (six) hours  as needed for severe pain. Patient taking differently: Take 50 mg by mouth every 6 (six) hours as needed for moderate pain or severe pain.  10/02/19   Dessa Phi, DO  TRESIBA FLEXTOUCH 100 UNIT/ML SOPN FlexTouch Pen Inject 60 Units into the skin daily.  10/08/19   [provider]  VENTOLIN HFA 108 (90 Base) MCG/ACT inhaler Inhale 1-2 puffs into the lungs every 6 (six) hours as needed for wheezing or shortness of breath. Patient taking differently: Inhale 2 puffs into the lungs every 6 (six) hours as needed for wheezing or shortness of breath.  09/14/17   Ghimire, Henreitta Leber, MD    Inpatient Medications: Scheduled Meds: . aspirin EC  81 mg Oral Daily  . atorvastatin  80 mg Oral q1800  . carvedilol  6.25 mg Oral BID WC  . clopidogrel  75 mg Oral Daily  . empagliflozin  10 mg Oral Daily  . enoxaparin (LOVENOX) injection  75 mg Subcutaneous Q24H  . furosemide  60 mg Oral BID  . insulin aspart  18 Units Subcutaneous TID WC  . insulin degludec  60 Units Subcutaneous Daily  . levothyroxine  125 mcg Oral QAC breakfast  . potassium chloride  10 mEq Oral Daily  . sacubitril-valsartan  1 tablet Oral BID  . sodium chloride flush  3 mL Intravenous Q12H  . timolol  1 drop  Both Eyes BID   Continuous Infusions: . sodium chloride     PRN Meds: sodium chloride, acetaminophen, albuterol, methocarbamol, ondansetron (ZOFRAN) IV, ondansetron, sodium chloride flush, traMADol  Allergies:    Allergies  Allergen Reactions  . Ampicillin Other (See Comments)    "Allergic," per [facility]  . Hydrocodone Other (See Comments)    "Allergic," per MAR  . Aspirin Nausea Only  . Unasyn [Ampicillin-Sulbactam Sodium] Rash and Other (See Comments)    "Allergic," per [facility]    Social History:   Social History   Socioeconomic History  . Marital status: Widowed    Spouse name: Not on file  . Number of children: Not on file  . Years of education: Not on file  . Highest education level: Not on file  Occupational History  . 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 Smoking status: Former Smoker    Quit date: 12/14/2005    Years since quitting: 14.4  . Smokeless tobacco: Never Used  . Tobacco comment: QUIT IN 2005  Substance and Sexual Activity  . Alcohol use: No  . Drug use: No  . Sexual activity: Never  Other Topics Concern  . Not on file  Social History Narrative  . Not on file   Social Determinants of Health   Financial Resource Strain:   . Difficulty of Paying Living Expenses:   Food Insecurity: No Food Insecurity  . Worried About Charity fundraiser in the Last Year: Never true  . Ran Out of Food in the Last Year: Never true  Transportation Needs: Unmet Transportation Needs  . Lack of Transportation (Medical): Yes  . Lack of Transportation (Non-Medical): No  Physical Activity:   . Days of Exercise per Week:   . Minutes of Exercise per Session:   Stress:   . Feeling of Stress :   Social Connections:   . Frequency of Communication with Friends and Family:   . Frequency of Social Gatherings with Friends and Family:   . Attends Religious Services:   . Active Member of Clubs or Organizations:   .  Attends Archivist  Meetings:   Marland Kitchen Marital Status:   Intimate Partner Violence:   . Fear of Current or Ex-Partner:   . Emotionally Abused:   Marland Kitchen Physically Abused:   . Sexually Abused:      Family History:    Family History  Problem Relation Age of Onset  . Diabetes Mother   . Hypertension Mother   . Cancer Mother        pancreas  . Hypertension Sister       Review of Systems    General:  No chills, fever, night sweats or weight changes.  Cardiovascular:  No chest pain, palpitations, paroxysmal nocturnal dyspnea. Positive for dyspnea on exertion, orthopnea and edema.  Dermatological: No rash, lesions/masses Respiratory: No cough, dyspnea Urologic: No hematuria, dysuria Abdominal:   No nausea, vomiting, diarrhea, bright red blood per rectum, melena, or hematemesis Neurologic:  No visual changes, wkns, changes in mental status. All other systems reviewed and are otherwise negative except as noted above.  Physical Exam/Data    Vitals:   05/18/20 0215 05/18/20 0402 05/18/20 0430 05/18/20 0741  BP:  129/68 129/76 128/71  Pulse: 86 81 75 78  Resp: 11 15  18   Temp:    97.8 F (36.6 C)  TempSrc:    Oral  SpO2: 99% 98% 96% 95%  Weight:      Height:       No intake or output data in the 24 hours ending 05/18/20 0851 Filed Weights   05/17/20 1805  Weight: (!) 152 kg   Body mass index is 55.75 kg/m.   General: Pleasant obese female appearing in NAD Psych: Normal affect. Neuro: Alert and oriented X 3. Moves all extremities spontaneously. HEENT: Normal  Neck: Supple without bruits or JVD. Lungs:  Resp regular and unlabored, rales along bases bilaterally. Heart: RRR no s3, s4, or murmurs. Abdomen: Soft, non-tender, non-distended, BS + x 4.  Extremities: No clubbing, cyanosis. 1+ pitting edema bilaterally. DP/PT/Radials 2+ and equal bilaterally.   EKG:  The EKG was personally reviewed and demonstrates: NSR, HR 81 with known LBBB.  Telemetry:  Telemetry was personally reviewed and  demonstrates: NSR, HR in 70's to 80's. No significant arrhythmias.    Labs/Studies     Relevant CV Studies:  Cardiac Catheterization: 09/2017  Prox RCA lesion, 30 %stenosed.  Prox RCA to Mid RCA lesion, 70 %stenosed.  Mid RCA lesion, 90 %stenosed.  RPDA lesion, 80 %stenosed.  Prox Cx lesion, 20 %stenosed.  Ost 1st Mrg lesion, 80 %stenosed.  1st Mrg-2 lesion, 80 %stenosed.  1st Mrg-1 lesion, 90 %stenosed.  Mid Cx-2 lesion, 50 %stenosed.  Mid Cx-1 lesion, 60 %stenosed.  Prox LAD lesion, 60 %stenosed.  Mid LAD lesion, 85 %stenosed.   Severe multivessel CAD with 60% proximal LAD stenosis and 85% mid LAD stenosis; 80, 90 and 80%  OM1 stenosis of the left circumflex with 60% mid AV groove and 50% mid distal AV groove stenosis; dominant RCA with 30% proximal followed by 70% proximal stenosis, 90% mid stenosis, and diffuse mid distal PDA stenosis of at least 80%.  LVEDP 33 mmHg;  EF by previous echo 30-35%; left ventriculography not performed.  Moderate right heart pressure elevation with mild/moderate pulmonary hypertension.  RECOMMENDATION: In this diabetic female with significant multivessel CAD, recommend surgical consultation for consideration for CABG revascularization.  Coronary Stent Intervention: 09/2017  LESION #1  Prox LAD lesion, 60 %stenosed. Prox LAD to Mid LAD lesion, 65 %stenosed. Mid LAD lesion, 85 %stenosed.  Tandem Lesions.  A STENT RESOLUTE ONYX [id number] drug eluting stent was successfully placed. Post-dilated in tapered fashion: 3.6 - 2.9 mm  Post intervention, there is a 0% residual stenosis.  LESION $2  Prox RCA lesion, 45 %stenosed. Prox RCA to Mid RCA lesion, 70 %stenosed. Mid RCA lesion, 90 %stenosed. Tandem Lesions  A STENT RESOLUTE ONYX [id number] drug eluting stent and STENT RESOLUTE ONYX 2.75X26 drug-eluting stent were successfully placed in overlapping fashion.  Post intervention, there is a 0% residual stenosis. Post-dilated in tapered  fashion: 3.6 - 3.1 mm  RPDA lesion, 80 %stenosed. Plan Medical management.   Successful 2 vessel PCI with stable circumflex disease. Plan is to treat circumflex flex disease medically for now.   Plan: Return to nursing unit for post PCI care TR band removal. Continue to optimize medical management, but from a post Standpoint she will be able to be discharged tomorrow. Continue aspirin plus Plavix for minimum one year, but would continue Plavix lifelong   Echocardiogram: 12/2018 Study Conclusions   - Limited study to evaluate LV function.  - Technically difficult study, poor visualization. Technical issues  with attempts at echo contrast injections, inadequate contrast  effect on imaging despite multiple doses.  - LVEF appears grossly in the 35-40% range.   Laboratory Data:  Chemistry Recent Labs  Lab 05/17/20 2107  NA 141  K 4.1  CL 105  CO2 26  GLUCOSE 134*  BUN 22  CREATININE 1.21*  CALCIUM 9.0  GFRNONAA 48*  GFRAA 56*  ANIONGAP 10    No results for input(s): PROT, ALBUMIN, AST, ALT, ALKPHOS, BILITOT in the last 168 hours. Hematology Recent Labs  Lab 05/17/20 2107  WBC 7.4  RBC 4.43  HGB 12.8  HCT 40.8  MCV 92.1  MCH 28.9  MCHC 31.4  RDW 13.6  PLT 237   Cardiac EnzymesNo results for input(s): TROPONINI in the last 168 hours. No results for input(s): TROPIPOC in the last 168 hours.  BNP Recent Labs  Lab 05/17/20 2107  BNP 132.0*    DDimer No results for input(s): DDIMER in the last 168 hours.  Radiology/Studies:  DG Chest Port 1 View  Result Date: 05/17/2020 CLINICAL DATA:  Shortness of breath and leg swelling EXAM: PORTABLE CHEST 1 VIEW COMPARISON:  Radiograph 12/26/2019 FINDINGS: Underpenetration of the image. Hazy basilar and perihilar predominant opacities with central vascular congestion and marked cardiomegaly suggesting pulmonary edema. No pneumothorax or visible effusion. No acute osseous or soft tissue abnormality. Degenerative  changes are present in the imaged spine and shoulders. IMPRESSION: Features compatible with CHF/volume overload with pulmonary edema and marked cardiomegaly. Electronically Signed   By: Lovena Le M.D.   On: 05/17/2020 22:58     Assessment & Plan    1. Acute on Chronic Combined Systolic and Diastolic CHF - she presented with worsening lower extremity edema for the past 2 weeks and developed worsening dyspnea on exertion and orthopnea over the past 2 to 3 days. Weight has been stable on her home scales but she reports decreased PO intake so we will need to establish a new dry weight. - BNP was minimally elevated at 132 but suspect this is inaccurate given her body habitus. CXR consistent with CHF. EF was 30-35% in 2018, at 35-40% by most recent echocardiogram in 12/2018.  Agree with repeat echocardiogram for reassessment of her EF. - Unfortunately, an IV has not been established but she did receive PO Lasix 60 mg this morning and reports 4-5 episodes of urination  since. We discussed PICC placement and she strongly wishes to avoid this currently. Given her frequent urination with PO Lasix, will continue for now. Will change to BID instead of Q6H as initially ordered. If response is not appropriate or renal function acutely worsens, she is aware she would need PICC placement. Follow I&O's along with daily weights.  - continue Coreg 3.25mg  BID and Entresto 97-103mg  BID. She was not on Spironolactone prior to admission and this was ordered as 25mg  daily for 3 days by the Hospitalist team. Would monitor renal function with increased Lasix dosing prior to adding this and await results of repeat echocardiogram. Reasonable to consider prior to discharge if renal function and BP remain stable and EF is still reduced by repeat echocardiogram. Also on Jardiance 10mg  daily for Type 2 DM and cardiovascular benefits.   2. CAD - she is s/p DES to LAD and RCA in 09/2017 as outlined above. She denies any recent chest  pain. EKG shows known LBBB and HS Troponin values are negative this admission. Plan for a repeat echocardiogram as outlined above.  - continue ASA 81mg  daily, Atorvastatin 80mg  daily, Plavix 75mg  daily (continue lifelong per Interventional Cardiology) and Coreg 6.25mg  BID.   3. HTN - BP has been well-controlled at 109/68 - 142/83 while in the ED. Continue current medication regimen.   4. Type 2 DM - Hgb A1c elevated to 8.9 in 12/2019. On Jardiance, Novolog and Tresiba as an outpatient. Further management per the admitting team.   5. Stage 2-3 CKD - baseline creatinine 1.2 - 1.3. Peaked at 2.67 in 12/2019. At 1.25 by recent labs in 04/2020 and at 1.21 this admission. Follow with diuresis.   6. Hypothyroidism - TSH was elevated to 85.399 in 04/2020 and she reports Synthroid was titrated by her PCP from 125 mcg daily to 23mcg daily. Ordered as 133mcg daily on admission and I have asked Pharmacy to verify her dosing.     For questions or updates, please contact [facility] [place] Please consult [email or web address] for contact info under Cardiology/STEMI.  Signed, Erma Heritage, PA-C 05/18/2020, 8:51 AM Pager: [phone]   Attending note:  Patient seen and examined.  I reviewed her records and discussed the case with Maureen Jackson, I agree with her above findings.  Maureen Jackson presents with worsening shortness of breath and leg edema, reportedly on stable cardiac regimen as an outpatient.  Last LVEF was in the range of 35 to 40% as of January 2020.  She is being admitted for diuresis and adjustment in medications.  On examination she is in no distress.  She is afebrile, heart rate in the 70s to 80s in sinus rhythm by telemetry which I personally reviewed, systolic blood pressure in the 120s.  She has bibasilar crackles, cardiac exam with RRR no gallop.  1-2+ peripheral edema noted.  Pertinent lab work includes potassium 4.1, BUN 22, creatinine 1.21, BNP 132, normal high-sensitivity  troponin I levels, hemoglobin 12.8, platelets 237.  Chest x-ray reports marked cardiomegaly with pulmonary edema.  I personally reviewed her ECG which shows sinus rhythm with left bundle branch block.  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  Agree with admission for further diuresis.  IV access is challenging at this time, she declines placement of PICC although this would be a good option.  Would ask for IV team to evaluate as she will do better on IV Lasix in the short-term.  Would get a follow-up echocardiogram as well.  Otherwise continue  Coreg, Entresto, and Aldactone with follow-up of BMET. She does not report any active angina symptoms with known history of CAD and prior DES intervention to the LAD and RCA.  Continue antiplatelet regimen and statin.  Satira Sark, M.D., F.A.C.C.

## 2020-05-18 NOTE — Progress Notes (Signed)
     Reviewed with the patient again and she is in agreement to proceed with PICC placement if unable to have an IV placed by the IV team. Patient's nurse reported someone from [facility] will be here later today. Once able to establish access, will switch to IV Lasix.   She is still having significant right leg pain which only mildly improved with Robaxin. Will order a repeat doppler study to rule-out DVT given her persistent symptoms.   Signed, Erma Heritage, PA-C 05/18/2020, 11:07 AM Pager: [phone]

## 2020-05-18 NOTE — Progress Notes (Signed)
*  PRELIMINARY RESULTS* Echocardiogram 2D Echocardiogram has been performed.  Maureen Jackson 05/18/2020, 12:48 PM

## 2020-05-18 NOTE — Progress Notes (Signed)
Pharmacy Brief Note: confirmation of levothyroxine dose  S/O:  Patient got levothyroxine 252mcg tabs filled at [facility] on 04/22/20  A/P: levothyroxine 182mcg/day changed to levothyroxine 264mcg/day starting 05/19/20.  Thank you for allowing pharmacy to participate in this patient's care.  Despina Pole, Pharm. D. Clinical Pharmacist 05/18/2020 10:36 AM

## 2020-05-18 NOTE — Progress Notes (Signed)
Peripherally Inserted Central Catheter Placement  The IV Nurse has discussed with the patient and/or persons authorized to consent for the patient, the purpose of this procedure and the potential benefits and risks involved with this procedure.  The benefits include less needle sticks, lab draws from the catheter, and the patient may be discharged home with the catheter. Risks include, but not limited to, infection, bleeding, blood clot (thrombus formation), and puncture of an artery; nerve damage and irregular heartbeat and possibility to perform a PICC exchange if needed/ordered by physician.  Alternatives to this procedure were also discussed.  Bard Power PICC patient education guide, fact sheet on infection prevention and patient information card has been provided to patient /or left at bedside.    PICC Placement Documentation  PICC Single Lumen 05/18/20 PICC Right Cephalic 40 cm 0 cm (Active)  Indication for Insertion or Continuance of Line Poor Vasculature-patient has had multiple peripheral attempts or PIVs lasting less than 24 hours 05/18/20 1800  Exposed Catheter (cm) 0 cm 05/18/20 1800  Site Assessment Clean;Dry;Intact 05/18/20 1800  Line Status Flushed;Saline locked;Blood return noted 05/18/20 1800  Dressing Type Transparent;Securing device 05/18/20 1800  Dressing Status Clean;Dry;Intact;Antimicrobial disc in place 05/18/20 1800  Dressing Change Due 05/25/20 05/18/20 1800       Holley Bouche [place] 05/18/2020, 6:09 PM

## 2020-05-18 NOTE — Progress Notes (Signed)
Per HPI: Maureen Jackson is a 64 y.o. female with medical history significant of chronic systolic HF with last LVEF of 35% in 2020, CAD, HTN, morbid obesity. She reports increased leg swelling for several weeks with pain on the right. She did have an outpatient LE venous doppler 04/30/20 negative for DVT. For several days she has had increasing dyspnea on exertion. Entresto and spiranolactone were preveiously stopped 2/2 rise in creatinine but her cardiologist,Dr. Harl Bowie [facility], has restarted these medications. She does not c/o chest pain. Because of these progressive symptoms she presents to [facility] for evaluation.   -Patient seen and evaluated this a.m. in the ED.  Patient has been admitted with acute on chronic combined systolic and diastolic CHF exacerbation.  She has been seen by cardiology and has been started on oral diuretics.  She does not have IV access and is now agreeable to PICC line placement.  Vascular ultrasound does not demonstrate any DVT to her lower extremities, but she continues to have significant pain to her right lower extremity.  Repeat 2D echocardiogram demonstrates LVEF of 30%.  Continue to follow diuresis as well as a.m. labs.  Appreciate cardiology evaluation and recommendations.  Total care time: 30 minutes.

## 2020-05-18 NOTE — Plan of Care (Signed)

## 2020-05-19 ENCOUNTER — Inpatient Hospital Stay (HOSPITAL_COMMUNITY): Payer: Medicare HMO

## 2020-05-19 DIAGNOSIS — M25561 Pain in right knee: Secondary | ICD-10-CM

## 2020-05-19 LAB — BASIC METABOLIC PANEL
Anion gap: 10 (ref 5–15)
BUN: 30 mg/dL — ABNORMAL HIGH (ref 8–23)
CO2: 29 mmol/L (ref 22–32)
Calcium: 8.5 mg/dL — ABNORMAL LOW (ref 8.9–10.3)
Chloride: 101 mmol/L (ref 98–111)
Creatinine, Ser: 1.33 mg/dL — ABNORMAL HIGH (ref 0.44–1.00)
GFR calc Af Amer: 50 mL/min — ABNORMAL LOW (ref >60)
GFR calc non Af Amer: 43 mL/min — ABNORMAL LOW (ref >60)
Glucose, Bld: 145 mg/dL — ABNORMAL HIGH (ref 70–99)
Potassium: 4.1 mmol/L (ref 3.5–5.1)
Sodium: 140 mmol/L (ref 135–145)

## 2020-05-19 LAB — MAGNESIUM: Magnesium: 2 mg/dL (ref 1.7–2.4)

## 2020-05-19 LAB — GLUCOSE, CAPILLARY
Glucose-Capillary: 135 mg/dL — ABNORMAL HIGH (ref 70–99)
Glucose-Capillary: 95 mg/dL (ref 70–99)
Glucose-Capillary: 83 mg/dL (ref 70–99)
Glucose-Capillary: 114 mg/dL — ABNORMAL HIGH (ref 70–99)

## 2020-05-19 NOTE — Plan of Care (Signed)
  Problem: Clinical Measurements: Goal: Cardiovascular complication will be avoided Outcome: Progressing   Problem: Clinical Measurements: Goal: Respiratory complications will improve Outcome: Progressing   Problem: Clinical Measurements: Goal: Will remain free from infection Outcome: Progressing   Problem: Safety: Goal: Ability to remain free from injury will improve Outcome: Progressing   Problem: Pain Managment: Goal: General experience of comfort will improve Outcome: Progressing

## 2020-05-19 NOTE — Evaluation (Signed)
Physical Therapy Evaluation Patient Details Name: Maureen Jackson MRN: [id number] DOB: Nov 04, 1956 Today's Date: 05/19/2020   History of Present Illness  Maureen Jackson is a 64 y.o. female with medical history significant of chronic systolic HF with last LVEF of 35% in 2020, CAD, HTN, morbid obesity. She reports increased leg swelling for several weeks with pain on the right. She did have an outpatient LE venous doppler 04/30/20 negative for DVT. For several days she has had increasing dyspnea on exertion. Entresto and spiranolactone were preveiously stopped 2/2 rise in creatinine but her cardiologist,Dr. Harl Bowie [facility], has restarted these medications. She does not c/o chest pain. Because of these progressive symptoms she presents to [facility] for evaluation.   Clinical Impression  Pt admitted with above diagnosis. Pt requires SUPV with OOB mobility due to R knee pain. Pt previously independent with ambulation, but good carryover with education and demonstration of RW usage to decrease weight on R knee and improve ambulation tolerance. Will continue to progress ambulation tolerance and train with RW. Pt currently with functional limitations due to the deficits listed below (see PT Problem List). Pt will benefit from skilled PT to increase their independence and safety with mobility to allow discharge to the venue listed below.       Follow Up Recommendations Home health PT;Supervision - Intermittent;Supervision for mobility/OOB    Equipment Recommendations  None recommended by PT    Recommendations for Other Services       Precautions / Restrictions Precautions Precautions: Fall Restrictions Weight Bearing Restrictions: No      Mobility  Bed Mobility  General bed mobility comments: seated EOB upon arrival  Transfers Overall transfer level: Needs assistance Equipment used: None Transfers: Sit to/from Stand;Stand Pivot Transfers Sit to Stand: Supervision Stand pivot transfers:  Supervision  General transfer comment: SUPV due to R knee pain in weight-bearing  Ambulation/Gait Ambulation/Gait assistance: Supervision Gait Distance (Feet): 25 Feet Assistive device: None;Rolling walker (2 wheeled) Gait Pattern/deviations: Step-through pattern;Decreased stride length;Wide base of support Gait velocity: decreased   General Gait Details: ambulates to bathroom without AD, good steadiness, upon standing from toilet increase R knee pain so requires RW with good steadiness ambulating back to bed  Stairs            Wheelchair Mobility    Modified Rankin (Stroke Patients Only)       Balance Overall balance assessment: Needs assistance Sitting-balance support: Feet supported;No upper extremity supported Sitting balance-Leahy Scale: Good Sitting balance - Comments: seated EOB   Standing balance support: During functional activity;No upper extremity supported;Bilateral upper extremity supported Standing balance-Leahy Scale: Fair Standing balance comment: fair/good depending on R knee pain limiting              Pertinent Vitals/Pain Pain Assessment: No/denies pain    Home Living Family/patient expects to be discharged to:: Private residence Living Arrangements: Alone Available Help at Discharge: Family;Friend(s);Available PRN/intermittently Type of Home: Apartment Home Access: Level entry     Home Layout: One level Home Equipment: Cane - single point;Walker - 4 wheels;Shower seat;Grab bars - tub/shower;Hand held shower head      Prior Function Level of Independence: Independent with assistive device(s)         Comments: Pt independent in ADLs, IADLs, and mobility PTA. Pt uses RW or SPC as needed but not consistently. No longer drives, daughter provides transportation     Hand Dominance   Dominant Hand: Right    Extremity/Trunk Assessment   Upper Extremity Assessment Upper Extremity  Assessment: Defer to OT evaluation    Lower Extremity  Assessment Lower Extremity Assessment: Generalized weakness(AROM WNL, strength grossly 3+/5, denies numbness/tingling)    Cervical / Trunk Assessment Cervical / Trunk Assessment: Normal  Communication   Communication: No difficulties  Cognition Arousal/Alertness: Awake/alert Behavior During Therapy: WFL for tasks assessed/performed Overall Cognitive Status: Within Functional Limits for tasks assessed             General Comments      Exercises     Assessment/Plan    PT Assessment Patient needs continued PT services  PT Problem List Decreased strength;Decreased range of motion;Decreased activity tolerance;Decreased balance;Decreased mobility;Decreased knowledge of use of DME;Pain       PT Treatment Interventions DME instruction;Gait training;Functional mobility training;Therapeutic activities;Therapeutic exercise;Balance training;Neuromuscular re-education;Patient/family education    PT Goals (Current goals can be found in the Care Plan section)  Acute Rehab PT Goals Patient Stated Goal: return home PT Goal Formulation: With patient Time For Goal Achievement: 05/26/20 Potential to Achieve Goals: Good    Frequency Min 3X/week   Barriers to discharge        Co-evaluation               AM-PAC PT "6 Clicks" Mobility  Outcome Measure Help needed turning from your back to your side while in a flat bed without using bedrails?: None Help needed moving from lying on your back to sitting on the side of a flat bed without using bedrails?: None Help needed moving to and from a bed to a chair (including a wheelchair)?: A Little Help needed standing up from a chair using your arms (e.g., wheelchair or bedside chair)?: A Little Help needed to walk in hospital room?: A Little Help needed climbing 3-5 steps with a railing? : A Little 6 Click Score: 20    End of Session   Activity Tolerance: Patient limited by pain Patient left: in bed;with call bell/phone within  reach;with bed alarm set(seated EOB) Nurse Communication: Mobility status PT Visit Diagnosis: Other abnormalities of gait and mobility (R26.89);Muscle weakness (generalized) (M62.81);Pain Pain - Right/Left: Right Pain - part of body: Knee    Time: 1145-1156 PT Time Calculation (min) (ACUTE ONLY): 11 min   Charges:   PT Evaluation $PT Eval Low Complexity: 1 Low          Tori Minie Roadcap PT, DPT 05/19/20, 12:34 PM [phone]

## 2020-05-19 NOTE — Progress Notes (Signed)
PROGRESS NOTE    Maureen Jackson  YFV:[id number] DOB: [birth year] DOA: 05/17/2020 PCP: Neale Burly, MD    Brief Narrative:  Maureen Jackson is a 64 y.o. female with medical history significant of chronic systolic HF with last LVEF of 35% in 2020, CAD, HTN, morbid obesity. She reports increased leg swelling for several weeks with pain on the right. She did have an outpatient LE venous doppler 04/30/20 negative for DVT. For several days she has had increasing dyspnea on exertion. Entresto and spiranolactone were preveiously stopped 2/2 rise in creatinine but her cardiologist,Dr. Harl Bowie [facility], has restarted these medications. She does not c/o chest pain. Because of these progressive symptoms she presents to [facility] for evaluation.    Assessment & Plan:   Active Problems:   Hypothyroidism   Essential hypertension   GERD   Diabetes mellitus type 2 with complications, uncontrolled ([place])   Acute combined systolic (congestive) and diastolic (congestive) heart failure ([facility])   Hyperlipidemia   Acute on chronic combined systolic (congestive) and diastolic (congestive) heart failure ([place])   1. Chronic combined CHF.  Ejection fraction 30%.  Continues to have evidence of volume overload.  She is on intravenous Lasix.  Cardiology following.  Continue to monitor intake and output.  Continue Coreg, Entresto 2. Hypertension.  Blood pressure currently stable.  Continue on Coreg 3. Diabetes.  Oral medication include Jardiance.  Chronically on Tresiba as well as NovoLog with meals.  We will continue on basal and bolus insulin. 4. Hyperlipidemia.  Continue on statin 5. Hypothyroidism. continue Synthroid. 6. CAD.  No complaints of chest pain.  Continue antiplatelet agents with aspirin and Plavix. 7. Right knee pain.  Venous Dopplers negative for DVT.  Will check x-ray.  Does not report any trauma.  Will request physical therapy evaluation.    DVT prophylaxis: Lovenox Code Status: Full code Family  Communication: Discussed with patient Disposition Plan: Status is: Inpatient  Remains inpatient appropriate because:IV treatments appropriate due to intensity of illness or inability to take PO   Dispo: The patient is from: Home              Anticipated d/c is to: Home              Anticipated d/c date is: 2 days              Patient currently is not medically stable to d/c.   Consultants:   Cardiology  Procedures:     Antimicrobials:       Subjective: Complains of pain in her right leg, mostly in her right knee which radiates down her leg.  Continues complain of significant swelling in her legs.  Has difficulty ambulating.  Still feels that breathing is not back to baseline.  Objective: Vitals:   05/19/20 0100 05/19/20 0511 05/19/20 0902 05/19/20 1300  BP: 114/78 100/60 (!) 99/51 124/84  Pulse: 71 70 65 74  Resp: 20 20    Temp: 97.8 F (36.6 C) 97.7 F (36.5 C) 97.6 F (36.4 C)   TempSrc: Oral Oral Oral Oral  SpO2: 96% 96% 97% 98%  Weight:  (!) 157.4 kg    Height:        Intake/Output Summary (Last 24 hours) at 05/19/2020 1730 Last data filed at 05/19/2020 1300 Gross per 24 hour  Intake 480 ml  Output 400 ml  Net 80 ml   Filed Weights   05/17/20 1805 05/19/20 0511  Weight: (!) 152 kg (!) 157.4 kg  Examination:  General exam: Appears calm and comfortable  Respiratory system: Diminished breath sounds at bases. Respiratory effort normal. Cardiovascular system: S1 & S2 heard, RRR. No JVD, murmurs, rubs, gallops or clicks. Gastrointestinal system: Abdomen is nondistended, soft and nontender. No organomegaly or masses felt. Normal bowel sounds heard. Central nervous system: Alert and oriented. No focal neurological deficits. Extremities: 1-2+ edema bilaterally. No erythema around right knee, no significant tenderness to palpation of right knee. Skin: No rashes, lesions or ulcers Psychiatry: Judgement and insight appear normal. Mood & affect appropriate.      Data Reviewed: I have personally reviewed following labs and imaging studies  CBC: Recent Labs  Lab 05/17/20 2107  WBC 7.4  HGB 12.8  HCT 40.8  MCV 92.1  PLT 884   Basic Metabolic Panel: Recent Labs  Lab 05/17/20 2107 05/18/20 0015 05/19/20 0537  NA 141  --  140  K 4.1  --  4.1  CL 105  --  101  CO2 26  --  29  GLUCOSE 134*  --  145*  BUN 22  --  30*  CREATININE 1.21*  --  1.33*  CALCIUM 9.0  --  8.5*  MG  --  2.3 2.0   GFR: Estimated Creatinine Clearance: 67.3 mL/min (A) (by C-G formula based on SCr of 1.33 mg/dL (H)). Liver Function Tests: No results for input(s): AST, ALT, ALKPHOS, BILITOT, PROT, ALBUMIN in the last 168 hours. No results for input(s): LIPASE, AMYLASE in the last 168 hours. No results for input(s): AMMONIA in the last 168 hours. Coagulation Profile: No results for input(s): INR, PROTIME in the last 168 hours. Cardiac Enzymes: No results for input(s): CKTOTAL, CKMB, CKMBINDEX, TROPONINI in the last 168 hours. BNP (last 3 results) No results for input(s): PROBNP in the last 8760 hours. HbA1C: No results for input(s): HGBA1C in the last 72 hours. CBG: Recent Labs  Lab 05/18/20 1818 05/18/20 2114 05/19/20 0754 05/19/20 1110 05/19/20 1604  GLUCAP 118* 143* 135* 95 83   Lipid Profile: No results for input(s): CHOL, HDL, LDLCALC, TRIG, CHOLHDL, LDLDIRECT in the last 72 hours. Thyroid Function Tests: No results for input(s): TSH, T4TOTAL, FREET4, T3FREE, THYROIDAB in the last 72 hours. Anemia Panel: No results for input(s): VITAMINB12, FOLATE, FERRITIN, TIBC, IRON, RETICCTPCT in the last 72 hours. Sepsis Labs: No results for input(s): PROCALCITON, LATICACIDVEN in the last 168 hours.  Recent Results (from the past 240 hour(s))  SARS Coronavirus 2 by RT PCR (hospital order, performed in [facility] hospital lab) Nasopharyngeal Nasopharyngeal Swab     Status: None   Collection Time: 05/17/20 11:30 PM   Specimen: Nasopharyngeal Swab   Result Value Ref Range Status   SARS Coronavirus 2 NEGATIVE NEGATIVE Final    Comment: (NOTE) SARS-CoV-2 target nucleic acids are NOT DETECTED. The SARS-CoV-2 RNA is generally detectable in upper and lower respiratory specimens during the acute phase of infection. The lowest concentration of SARS-CoV-2 viral copies this assay can detect is 250 copies / mL. A negative result does not preclude SARS-CoV-2 infection and should not be used as the sole basis for treatment or other patient management decisions.  A negative result may occur with improper specimen collection / handling, submission of specimen other than nasopharyngeal swab, presence of viral mutation(s) within the areas targeted by this assay, and inadequate number of viral copies (<250 copies / mL). A negative result must be combined with clinical observations, patient history, and epidemiological information. Fact Sheet for Patients:   StrictlyIdeas.no Fact  Sheet for Healthcare Providers: BankingDealers.co.za This test is not yet approved or cleared  by the [place] and has been authorized for detection and/or diagnosis of SARS-CoV-2 by FDA under an Emergency Use Authorization (EUA).  This EUA will remain in effect (meaning this test can be used) for the duration of the COVID-19 declaration under Section 564(b)(1) of the Act, 21 U.S.C. section 360bbb-3(b)(1), unless the authorization is terminated or revoked sooner. Performed at [facility], [address], [place], [place] [address]          Radiology Studies: US Venous Img Lower Unilateral Right (DVT)  Result Date: 05/18/2020 CLINICAL DATA:  Right lower extremity pain and edema. EXAM: RIGHT LOWER EXTREMITY VENOUS DOPPLER ULTRASOUND TECHNIQUE: Gray-scale sonography with graded compression, as well as color Doppler and duplex ultrasound were performed to evaluate the lower extremity deep venous systems from the  level of the common femoral vein and including the common femoral, femoral, profunda femoral, popliteal and calf veins including the posterior tibial, peroneal and gastrocnemius veins when visible. The superficial great saphenous vein was also interrogated. Spectral Doppler was utilized to evaluate flow at rest and with distal augmentation maneuvers in the common femoral, femoral and popliteal veins. COMPARISON:  None. FINDINGS: Contralateral Common Femoral Vein: Respiratory phasicity is normal and symmetric with the symptomatic side. No evidence of thrombus. Normal compressibility. Common Femoral Vein: No evidence of thrombus. Normal compressibility, respiratory phasicity and response to augmentation. Saphenofemoral Junction: No evidence of thrombus. Normal compressibility and flow on color Doppler imaging. Profunda Femoral Vein: No evidence of thrombus. Normal compressibility and flow on color Doppler imaging. Femoral Vein: No evidence of thrombus. Normal compressibility, respiratory phasicity and response to augmentation. Popliteal Vein: No evidence of thrombus. Normal compressibility, respiratory phasicity and response to augmentation. Calf Veins: No evidence of thrombus. Normal compressibility and flow on color Doppler imaging. Superficial Great Saphenous Vein: No evidence of thrombus. Normal compressibility. Venous Reflux:  None. Other Findings: No evidence of superficial thrombophlebitis or abnormal fluid collection. IMPRESSION: No evidence of right lower extremity deep venous thrombosis. Electronically Signed   By: Aletta Edouard M.D.   On: 05/18/2020 12:19   DG Chest Port 1 View  Result Date: 05/17/2020 CLINICAL DATA:  Shortness of breath and leg swelling EXAM: PORTABLE CHEST 1 VIEW COMPARISON:  Radiograph 12/26/2019 FINDINGS: Underpenetration of the image. Hazy basilar and perihilar predominant opacities with central vascular congestion and marked cardiomegaly suggesting pulmonary edema. No  pneumothorax or visible effusion. No acute osseous or soft tissue abnormality. Degenerative changes are present in the imaged spine and shoulders. IMPRESSION: Features compatible with CHF/volume overload with pulmonary edema and marked cardiomegaly. Electronically Signed   By: Lovena Le M.D.   On: 05/17/2020 22:58   DG Knee Complete 4 Views Right  Result Date: 05/19/2020 CLINICAL DATA:  Right knee pain for 2 weeks EXAM: RIGHT KNEE - COMPLETE 4+ VIEW COMPARISON:  02/14/2016 FINDINGS: Frontal, bilateral oblique, lateral views of the right knee are obtained. No fracture, subluxation, or dislocation. There is 3 compartmental osteoarthritis most pronounced in the patellofemoral compartment. This is progressive since prior study. Small joint effusion. Mild diffuse subcutaneous edema. IMPRESSION: 1. 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 2. Small effusion. 3. Diffuse subcutaneous edema. Electronically Signed   By: Randa Ngo M.D.   On: 05/19/2020 15:03   ECHOCARDIOGRAM COMPLETE  Result Date: 05/18/2020    ECHOCARDIOGRAM REPORT   Patient Name:   JIMMYE WISNIESKI Date of Exam: 05/18/2020 Medical Rec #:  [id number]       Height:       65.0 in Accession #:    [id number]      Weight:       335.0 lb Date of Birth:  1956-05-26        BSA:          2.463 m Patient Age:    76 years        BP:           128/71 mmHg Patient Gender: F               HR:           78 bpm. Exam Location:  [facility] Procedure: 2D Echo Indications:    CHF-Acute Systolic 283.15 / V76.16  History:        Patient has prior history of Echocardiogram examinations, most                 recent 12/16/2018. CAD, TIA, Signs/Symptoms:Chest Pain; Risk                 Factors:Hypertension, Former Smoker, Diabetes and Dyslipidemia.  Sonographer:    Leavy Cella RDCS (AE) Referring Phys: [place]  1. Images are limited. Suggest Definity contrast when adequate IV access available.  2. Left  ventricular ejection fraction, by estimation, is approximately 30%. The left ventricle has severely decreased function. Left ventricular endocardial border not optimally defined to evaluate regional wall motion. There is moderate left ventricular hypertrophy. Left ventricular diastolic parameters are indeterminate.  3. Right ventricular systolic function is normal. The right ventricular size is normal.  4. Moderate pericardial effusion. The pericardial effusion is circumferential.  5. The mitral valve is grossly normal. Trivial mitral valve regurgitation.  6. The aortic valve is tricuspid. Aortic valve regurgitation is not visualized.  7. The inferior vena cava is normal in size with greater than 50% respiratory variability, suggesting right atrial pressure of 3 mmHg. FINDINGS  Left Ventricle: Left ventricular ejection fraction, by estimation, is 30%. The left ventricle has severely decreased function. Left ventricular endocardial border not optimally defined to evaluate regional wall motion. The left ventricular internal cavity size was normal in size. There is moderate left ventricular hypertrophy. Left ventricular diastolic parameters are indeterminate. Right Ventricle: The right ventricular size is normal. No increase in right ventricular wall thickness. Right ventricular systolic function is normal. Left Atrium: Left atrial size was normal in size. Right Atrium: Right atrial size was normal in size. Pericardium: A moderately sized pericardial effusion is present. The pericardial effusion is circumferential. Mitral Valve: The mitral valve is grossly normal. Trivial mitral valve regurgitation. Tricuspid Valve: The tricuspid valve is not well visualized. Tricuspid valve regurgitation is trivial. Aortic Valve: The aortic valve is tricuspid. Aortic valve regurgitation is not visualized. Mild aortic valve annular calcification. Pulmonic Valve: The pulmonic valve was not well visualized. Pulmonic valve regurgitation  is not visualized. Aorta: The aortic root is normal in size and structure. Venous: The inferior vena cava is normal in size with greater than 50% respiratory variability, suggesting right atrial pressure of 3 mmHg. IAS/Shunts: No atrial level shunt detected by color flow Doppler.  LEFT VENTRICLE PLAX 2D LVIDd:         5.38 cm  Diastology LVIDs:         3.57 cm  LV e' lateral:   4.05 cm/s LV PW:         1.69 cm  LV E/e' lateral:  15.6 LV IVS:        1.47 cm  LV e' medial:    3.98 cm/s LVOT diam:     2.10 cm  LV E/e' medial:  15.9 LVOT Area:     3.46 cm  RIGHT VENTRICLE RV S prime:     8.50 cm/s TAPSE (M-mode): 1.9 cm LEFT ATRIUM           Index       RIGHT ATRIUM           Index LA diam:      3.80 cm 1.54 cm/m  RA Area:     18.30 cm LA Vol (A2C): 48.1 ml 19.53 ml/m RA Volume:   56.10 ml  22.78 ml/m LA Vol (A4C): 63.4 ml 25.74 ml/m   AORTA Ao Root diam: 2.70 cm MITRAL VALVE MV Area (PHT): 4.39 cm    SHUNTS MV Decel Time: 173 msec    Systemic Diam: 2.10 cm MV E velocity: 63.10 cm/s MV A velocity: 61.10 cm/s MV E/A ratio:  1.03 Rozann Lesches MD Electronically signed by Rozann Lesches MD Signature Date/Time: 05/18/2020/12:55:38 PM    Final    [place] EKG SITE RITE  Result Date: 05/18/2020 If Site Rite image not attached, placement could not be confirmed due to current cardiac rhythm.       Scheduled Meds: . aspirin EC  81 mg Oral Daily  . atorvastatin  80 mg Oral q1800  . carvedilol  6.25 mg Oral BID WC  . Chlorhexidine Gluconate Cloth  6 each Topical Daily  . clopidogrel  75 mg Oral Daily  . empagliflozin  10 mg Oral Daily  . enoxaparin (LOVENOX) injection  75 mg Subcutaneous Q24H  . furosemide  60 mg Intravenous BID  . insulin aspart  18 Units Subcutaneous TID WC  . insulin glargine  60 Units Subcutaneous Daily  . levothyroxine  200 mcg Oral QAC breakfast  . potassium chloride  10 mEq Oral Daily  . sacubitril-valsartan  1 tablet Oral BID  . sodium chloride flush  10-40 mL Intracatheter Q12H   . sodium chloride flush  3 mL Intravenous Q12H  . timolol  1 drop Both Eyes BID   Continuous Infusions: . sodium chloride       LOS: 1 day    Time spent: 47mins    Kathie Dike, MD [facility]   If 7PM-7AM, please contact night-coverage [email or web address]  05/19/2020, 5:30 PM

## 2020-05-19 NOTE — Progress Notes (Signed)
Progress Note  Patient Name: Maureen Jackson Date of Encounter: 05/19/2020  Primary Cardiologist: Carlyle Dolly, MD  Subjective   No chest pain, breathing not yet back to baseline.  Complains of right knee and leg pain with walking.  No orthopnea.  Inpatient Medications    Scheduled Meds: . aspirin EC  81 mg Oral Daily  . atorvastatin  80 mg Oral q1800  . carvedilol  6.25 mg Oral BID WC  . Chlorhexidine Gluconate Cloth  6 each Topical Daily  . clopidogrel  75 mg Oral Daily  . empagliflozin  10 mg Oral Daily  . enoxaparin (LOVENOX) injection  75 mg Subcutaneous Q24H  . furosemide  60 mg Intravenous BID  . insulin aspart  18 Units Subcutaneous TID WC  . insulin glargine  60 Units Subcutaneous Daily  . levothyroxine  200 mcg Oral QAC breakfast  . potassium chloride  10 mEq Oral Daily  . sacubitril-valsartan  1 tablet Oral BID  . sodium chloride flush  10-40 mL Intracatheter Q12H  . sodium chloride flush  3 mL Intravenous Q12H  . timolol  1 drop Both Eyes BID   Continuous Infusions: . sodium chloride     PRN Meds: sodium chloride, acetaminophen, albuterol, methocarbamol, ondansetron (ZOFRAN) IV, ondansetron, sodium chloride flush, sodium chloride flush, traMADol   Vital Signs    Vitals:   05/18/20 2150 05/18/20 2201 05/19/20 0100 05/19/20 0511  BP: (!) 110/57 (!) 110/57 114/78 100/60  Pulse: 78 68 71 70  Resp: 20  20 20   Temp: 97.7 F (36.5 C) 97.7 F (36.5 C) 97.8 F (36.6 C) 97.7 F (36.5 C)  TempSrc: Oral Oral Oral Oral  SpO2: 97% 93% 96% 96%  Weight:    (!) 157.4 kg  Height:        Intake/Output Summary (Last 24 hours) at 05/19/2020 0910 Last data filed at 05/19/2020 0100 Gross per 24 hour  Intake --  Output 400 ml  Net -400 ml   Filed Weights   05/17/20 1805 05/19/20 0511  Weight: (!) 152 kg (!) 157.4 kg    Telemetry    Sinus rhythm.  Personally reviewed.  ECG    An ECG dated 05/18/2020 was personally reviewed today and demonstrated:  Sinus  rhythm with left bundle branch block.  Physical Exam   GEN:  Morbidly obese.  No acute distress.   Neck:  Difficult to evaluate JVP. Cardiac: RRR, no gallop.  Respiratory:  Decreased breath sounds, no wheezing. GI: Soft, nontender, bowel sounds present. MS:  Chronic appearing leg edema; No deformity.  Labs    Chemistry Recent Labs  Lab 05/17/20 2107 05/19/20 0537  NA 141 140  K 4.1 4.1  CL 105 101  CO2 26 29  GLUCOSE 134* 145*  BUN 22 30*  CREATININE 1.21* 1.33*  CALCIUM 9.0 8.5*  GFRNONAA 48* 43*  GFRAA 56* 50*  ANIONGAP 10 10     Hematology Recent Labs  Lab 05/17/20 2107  WBC 7.4  RBC 4.43  HGB 12.8  HCT 40.8  MCV 92.1  MCH 28.9  MCHC 31.4  RDW 13.6  PLT 237    Cardiac Enzymes Recent Labs  Lab 05/17/20 2107 05/18/20 0015  TROPONINIHS 9 8    BNP Recent Labs  Lab 05/17/20 2107  BNP 132.0*     Radiology    US Venous Img Lower Unilateral Right (DVT)  Result Date: 05/18/2020 CLINICAL DATA:  Right lower extremity pain and edema. EXAM: RIGHT LOWER EXTREMITY VENOUS DOPPLER  ULTRASOUND TECHNIQUE: Gray-scale sonography with graded compression, as well as color Doppler and duplex ultrasound were performed to evaluate the lower extremity deep venous systems from the level of the common femoral vein and including the common femoral, femoral, profunda femoral, popliteal and calf veins including the posterior tibial, peroneal and gastrocnemius veins when visible. The superficial great saphenous vein was also interrogated. Spectral Doppler was utilized to evaluate flow at rest and with distal augmentation maneuvers in the common femoral, femoral and popliteal veins. COMPARISON:  None. FINDINGS: Contralateral Common Femoral Vein: Respiratory phasicity is normal and symmetric with the symptomatic side. No evidence of thrombus. Normal compressibility. Common Femoral Vein: No evidence of thrombus. Normal compressibility, respiratory phasicity and response to augmentation.  Saphenofemoral Junction: No evidence of thrombus. Normal compressibility and flow on color Doppler imaging. Profunda Femoral Vein: No evidence of thrombus. Normal compressibility and flow on color Doppler imaging. Femoral Vein: No evidence of thrombus. Normal compressibility, respiratory phasicity and response to augmentation. Popliteal Vein: No evidence of thrombus. Normal compressibility, respiratory phasicity and response to augmentation. Calf Veins: No evidence of thrombus. Normal compressibility and flow on color Doppler imaging. Superficial Great Saphenous Vein: No evidence of thrombus. Normal compressibility. Venous Reflux:  None. Other Findings: No evidence of superficial thrombophlebitis or abnormal fluid collection. IMPRESSION: No evidence of right lower extremity deep venous thrombosis. Electronically Signed   By: Aletta Edouard M.D.   On: 05/18/2020 12:19   DG Chest Port 1 View  Result Date: 05/17/2020 CLINICAL DATA:  Shortness of breath and leg swelling EXAM: PORTABLE CHEST 1 VIEW COMPARISON:  Radiograph 12/26/2019 FINDINGS: Underpenetration of the image. Hazy basilar and perihilar predominant opacities with central vascular congestion and marked cardiomegaly suggesting pulmonary edema. No pneumothorax or visible effusion. No acute osseous or soft tissue abnormality. Degenerative changes are present in the imaged spine and shoulders. IMPRESSION: Features compatible with CHF/volume overload with pulmonary edema and marked cardiomegaly. Electronically Signed   By: Lovena Le M.D.   On: 05/17/2020 22:58   ECHOCARDIOGRAM COMPLETE  Result Date: 05/18/2020    ECHOCARDIOGRAM REPORT   Patient Name:   Maureen Jackson Date of Exam: 05/18/2020 Medical Rec #:  [id number]       Height:       65.0 in Accession #:    [id number]      Weight:       335.0 lb Date of Birth:  11-Jul-1956        BSA:          2.463 m Patient Age:    64 years        BP:           128/71 mmHg Patient Gender: F               HR:            78 bpm. Exam Location:  [facility] Procedure: 2D Echo Indications:    CHF-Acute Systolic 169.67 / E93.81  History:        Patient has prior history of Echocardiogram examinations, most                 recent 12/16/2018. CAD, TIA, Signs/Symptoms:Chest Pain; Risk                 Factors:Hypertension, Former Smoker, Diabetes and Dyslipidemia.  Sonographer:    Leavy Cella RDCS (AE) Referring Phys: [place]  1. Images are limited. Suggest Definity contrast when adequate IV access available.  2. Left ventricular ejection fraction, by estimation, is approximately 30%. The left ventricle has severely decreased function. Left ventricular endocardial border not optimally defined to evaluate regional wall motion. There is moderate left ventricular hypertrophy. Left ventricular diastolic parameters are indeterminate.  3. Right ventricular systolic function is normal. The right ventricular size is normal.  4. Moderate pericardial effusion. The pericardial effusion is circumferential.  5. The mitral valve is grossly normal. Trivial mitral valve regurgitation.  6. The aortic valve is tricuspid. Aortic valve regurgitation is not visualized.  7. The inferior vena cava is normal in size with greater than 50% respiratory variability, suggesting right atrial pressure of 3 mmHg. FINDINGS  Left Ventricle: Left ventricular ejection fraction, by estimation, is 30%. The left ventricle has severely decreased function. Left ventricular endocardial border not optimally defined to evaluate regional wall motion. The left ventricular internal cavity size was normal in size. There is moderate left ventricular hypertrophy. Left ventricular diastolic parameters are indeterminate. Right Ventricle: The right ventricular size is normal. No increase in right ventricular wall thickness. Right ventricular systolic function is normal. Left Atrium: Left atrial size was normal in size. Right Atrium: Right atrial size was normal in  size. Pericardium: A moderately sized pericardial effusion is present. The pericardial effusion is circumferential. Mitral Valve: The mitral valve is grossly normal. Trivial mitral valve regurgitation. Tricuspid Valve: The tricuspid valve is not well visualized. Tricuspid valve regurgitation is trivial. Aortic Valve: The aortic valve is tricuspid. Aortic valve regurgitation is not visualized. Mild aortic valve annular calcification. Pulmonic Valve: The pulmonic valve was not well visualized. Pulmonic valve regurgitation is not visualized. Aorta: The aortic root is normal in size and structure. Venous: The inferior vena cava is normal in size with greater than 50% respiratory variability, suggesting right atrial pressure of 3 mmHg. IAS/Shunts: No atrial level shunt detected by color flow Doppler.  LEFT VENTRICLE PLAX 2D LVIDd:         5.38 cm  Diastology LVIDs:         3.57 cm  LV e' lateral:   4.05 cm/s LV PW:         1.69 cm  LV E/e' lateral: 15.6 LV IVS:        1.47 cm  LV e' medial:    3.98 cm/s LVOT diam:     2.10 cm  LV E/e' medial:  15.9 LVOT Area:     3.46 cm  RIGHT VENTRICLE RV S prime:     8.50 cm/s TAPSE (M-mode): 1.9 cm LEFT ATRIUM           Index       RIGHT ATRIUM           Index LA diam:      3.80 cm 1.54 cm/m  RA Area:     18.30 cm LA Vol (A2C): 48.1 ml 19.53 ml/m RA Volume:   56.10 ml  22.78 ml/m LA Vol (A4C): 63.4 ml 25.74 ml/m   AORTA Ao Root diam: 2.70 cm MITRAL VALVE MV Area (PHT): 4.39 cm    SHUNTS MV Decel Time: 173 msec    Systemic Diam: 2.10 cm MV E velocity: 63.10 cm/s MV A velocity: 61.10 cm/s MV E/A ratio:  1.03 Rozann Lesches MD Electronically signed by Rozann Lesches MD Signature Date/Time: 05/18/2020/12:55:38 PM    Final    [place] EKG SITE RITE  Result Date: 05/18/2020 If Site Rite image not attached, placement could not be confirmed due to current cardiac rhythm.   Patient  Profile     64 y.o. female with past medical history of CAD (s/p DES to LAD and RCA in 09/2017),  ischemic cardiomyopathy(EF 30-35% in 09/2017, at 35-40% by repeat in 12/2018), HTN, HLD, Type 2 DM, Stage 2-3 CKD, and history of COVID PNA (admitted in 12/2019) who is being seen for the evaluation of CHF.   Assessment & Plan    1.  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  Echocardiogram from yesterday indicated LVEF approximately 30% with very limited images, follow-up Definity study pending for today.  She now has a PICC line in place with IV Lasix starting today.  2.  CAD status post DES to the LAD and RCA in 2018.  She does not report any active angina at this time, ECG with old left bundle branch block and negative high-sensitivity troponin I levels.  3.  CKD stage II-III.  Creatinine 1.33 with normal potassium.  4.  Essential hypertension 100-115 range.  Continue aspirin, Plavix, Coreg, Lipitor, Jardiance, and Entresto.  Currently on Lasix at 60 mg IV twice daily with potassium supplement.  Follow-up urine output and BMET in a.m.  Signed, Rozann Lesches, MD  05/19/2020, 9:10 AM

## 2020-05-19 NOTE — TOC Initial Note (Signed)
Transition of Care [facility]) - Initial/Assessment Note    Patient Details  Name: Maureen Jackson MRN: [id number] Date of Birth: [birth year]  Transition [facility] CM/SW Contact:    Salome Arnt, [place] Phone Number: 05/19/2020, 8:48 AM  Clinical Narrative:  LCSW completed assessment due to high readmission risk score and CHF screening. Pt reports she lives alone, but her daughter lives nearby and assists with laundry, groceries, etc. Pt admitted due to CHF. She states she manages fairly well at home. She relies on her daughter or [employer] for transportation. Pt plans to return home when medically stable. LCSW completed CHF screening. Pt states she weighs herself every other day. When asked about following heart healthy diet, pt states, "I try to." She is followed by Dr. Harl Bowie. Pt reports she takes her medications as prescribed. TOC will follow for d/c planning needs.                  Expected Discharge Plan: Home/Self Care Barriers to Discharge: Continued Medical Work up   Patient Goals and CMS Choice Patient states their goals for this hospitalization and ongoing recovery are:: return home      Expected Discharge Plan and Services Expected Discharge Plan: Home/Self Care In-house Referral: Clinical Social Work     Living arrangements for the past 2 months: Apartment                                      Prior Living Arrangements/Services Living arrangements for the past 2 months: Apartment Lives with:: Self Patient language and need for interpreter reviewed:: Yes Do you feel safe going back to the place where you live?: Yes      Need for Family Participation in Patient Care: No (Comment) Care giver support system in place?: Yes (comment)   Criminal Activity/Legal Involvement Pertinent to Current Situation/Hospitalization: No - Comment as needed  Activities of Daily Living Home Assistive Devices/Equipment: None ADL Screening (condition at time of  admission) Patient's cognitive ability adequate to safely complete daily activities?: Yes Is the patient deaf or have difficulty hearing?: No Does the patient have difficulty seeing, even when wearing glasses/contacts?: No Does the patient have difficulty concentrating, remembering, or making decisions?: No Patient able to express need for assistance with ADLs?: No Does the patient have difficulty dressing or bathing?: No Independently performs ADLs?: Yes (appropriate for developmental age) Does the patient have difficulty walking or climbing stairs?: No Weakness of Legs: None Weakness of Arms/Hands: None  Permission Sought/Granted                  Emotional Assessment Appearance:: Appears stated age Attitude/Demeanor/Rapport: Other (comment)(Appropriate) Affect (typically observed): Accepting Orientation: : Oriented to Self, Oriented to Place, Oriented to  Time, Oriented to Situation Alcohol / Substance Use: Not Applicable Psych Involvement: No (comment)  Admission diagnosis:  Acute pulmonary edema (HCC) [J81.0] SOB (shortness of breath) [R06.02] Right leg pain [M79.604] Acute on chronic combined systolic (congestive) and diastolic (congestive) heart failure (HCC) [I50.43] Patient Active Problem List   Diagnosis Date Noted  . Acute on chronic combined systolic (congestive) and diastolic (congestive) heart failure ([place]) 05/18/2020  . Acute respiratory disease due to COVID-19 virus 12/26/2019  . Pressure injury of skin 09/21/2019  . Drug rash   . Abscess   . Fistula   . Fournier's gangrene in female   . Chest pain 07/16/2018  . Hyperlipidemia 04/10/2018  .  TIA (transient ischemic attack) 03/14/2018  . Vertigo 03/14/2018  . Chronic combined systolic and diastolic CHF (congestive heart failure) (EF 30 to 35 %) 03/14/2018  . Ischemic cardiomyopathy 11/06/2017  . CAD, multiple vessel   . Pericardial effusion 09/07/2017  . Acute combined systolic (congestive) and diastolic  (congestive) heart failure ([place]) 09/06/2017  . Cardiomegaly 09/05/2017  . Morbid obesity/BMI > 55 03/04/2013  . Labyrinthitis 03/04/2013  . Diabetes mellitus type 2 with complications, uncontrolled ([place]) 03/04/2013  . GASTRITIS 12/13/2006  . HIATAL HERNIA, HX OF 12/13/2006  . THYROIDECTOMY, HX OF 12/13/2006  . Hypothyroidism 10/04/2006  . TOBACCO ABUSE 10/04/2006  . CARPAL TUNNEL SYNDROME 10/04/2006  . Essential hypertension 10/04/2006  . GERD 10/04/2006  . POSTMENOPAUSAL STATUS 10/04/2006  . SKIN TAG 10/04/2006  . KNEE PAIN, LEFT 10/04/2006  . Sleep apnea 10/04/2006  . LEG EDEMA 10/04/2006   PCP:  Neale Burly, MD Pharmacy:   [facility], [address] AT [address] [place] [address] Phone: [phone] Fax: [phone]     Social Determinants of Health (SDOH) Interventions    Readmission Risk Interventions Readmission Risk Prevention Plan 05/19/2020 09/18/2019  Transportation Screening Complete Complete  PCP or Specialist Appt within 3-5 Days Not Complete -  HRI or [place] Complete Complete  Social Work Consult for [place] Planning/Counseling Complete Complete  Palliative Care Screening Not Applicable Not Applicable  Medication Review Press photographer) Complete Complete  Some recent data might be hidden

## 2020-05-19 NOTE — Evaluation (Signed)
Occupational Therapy Evaluation Patient Details Name: Maureen Jackson MRN: [id number] DOB: 09/06/1956 Today's Date: 05/19/2020    History of Present Illness Maureen Jackson is a 64 y.o. female with medical history significant of chronic systolic HF with last LVEF of 35% in 2020, CAD, HTN, morbid obesity. She reports increased leg swelling for several weeks with pain on the right. She did have an outpatient LE venous doppler 04/30/20 negative for DVT. For several days she has had increasing dyspnea on exertion. Entresto and spiranolactone were preveiously stopped 2/2 rise in creatinine but her cardiologist,Dr. Harl Bowie [facility], has restarted these medications. She does not c/o chest pain. Because of these progressive symptoms she presents to [facility] for evaluation.    Clinical Impression   Pt agreeable to OT evaluation this am. Pt demonstrates ADL completion and functional mobility tasks at supervision level. Pt is at baseline with ADLs, requiring rest breaks occasionally due to fatigue. Pt is familiar with energy conservation strategies and employs at home. No further OT services required at this time. Pt reports her daughter is available to assist as needed on discharge.     Follow Up Recommendations  No OT follow up;Supervision/Assistance - 24 hour    Equipment Recommendations  None recommended by OT       Precautions / Restrictions Precautions Precautions: None Restrictions Weight Bearing Restrictions: No      Mobility Bed Mobility Overal bed mobility: Modified Independent                Transfers Overall transfer level: Modified independent Equipment used: None                      ADL either performed or assessed with clinical judgement   ADL Overall ADL's : Needs assistance/impaired     Grooming: Wash/dry hands;Supervision/safety;Standing           Upper Body Dressing : Modified independent;Sitting   Lower Body Dressing:  Supervision/safety;Sitting/lateral leans   Toilet Transfer: Supervision/safety;Ambulation;Grab bars;Regular Museum/gallery exhibitions officer and Hygiene: Supervision/safety;Sitting/lateral lean;Sit to/from stand       Functional mobility during ADLs: Supervision/safety       Vision Baseline Vision/History: Wears glasses Wears Glasses: At all times Patient Visual Report: No change from baseline Vision Assessment?: No apparent visual deficits            Pertinent Vitals/Pain Pain Assessment: No/denies pain     Hand Dominance Right   Extremity/Trunk Assessment Upper Extremity Assessment Upper Extremity Assessment: Generalized weakness   Lower Extremity Assessment Lower Extremity Assessment: Defer to PT evaluation   Cervical / Trunk Assessment Cervical / Trunk Assessment: Normal   Communication Communication Communication: No difficulties   Cognition Arousal/Alertness: Awake/alert Behavior During Therapy: WFL for tasks assessed/performed Overall Cognitive Status: Within Functional Limits for tasks assessed                                                Home Living Family/patient expects to be discharged to:: Private residence Living Arrangements: Alone Available Help at Discharge: Family;Friend(s);Available PRN/intermittently Type of Home: Apartment Home Access: Level entry     Home Layout: One level     Bathroom Shower/Tub: Teacher, early years/pre: Standard     Home Equipment: Cane - single point;Walker - 4 wheels;Shower seat;Grab bars - tub/shower;Hand held shower head;Other (comment)  Prior Functioning/Environment Level of Independence: Independent with assistive device(s)        Comments: Pt independent in ADLs, IADLs, and mobility PTA. Pt uses RW or SPC as needed but not consistently. No longer drives, daughter provides transportation        OT Problem List: Cardiopulmonary status limiting  activity          End of Session    Activity Tolerance: Patient tolerated treatment well Patient left: in bed;with call bell/phone within reach  OT Visit Diagnosis: Muscle weakness (generalized) (M62.81)                Time: 4604-7998 OT Time Calculation (min): 12 min Charges:  OT General Charges $OT Visit: 1 Visit OT Evaluation $OT Eval Low Complexity: [place], OTR/L  [phone] 05/19/2020, 8:17 AM

## 2020-05-19 NOTE — Plan of Care (Signed)
  Problem: Acute Rehab PT Goals(only PT should resolve) Goal: Patient Will Transfer Sit To/From Stand Outcome: Progressing Flowsheets (Taken 05/19/2020 1236) Patient will transfer sit to/from stand: with modified independence Goal: Pt Will Transfer Bed To Chair/Chair To Bed Outcome: Progressing Flowsheets (Taken 05/19/2020 1236) Pt will Transfer Bed to Chair/Chair to Bed: with modified independence Goal: Pt Will Ambulate Outcome: Progressing Flowsheets (Taken 05/19/2020 1236) Pt will Ambulate:  > 125 feet  with modified independence  with rolling walker   Tori Lyndall Windt PT, DPT 05/19/20, 12:37 PM [phone]

## 2020-05-20 DIAGNOSIS — I5041 Acute combined systolic (congestive) and diastolic (congestive) heart failure: Secondary | ICD-10-CM

## 2020-05-20 LAB — BASIC METABOLIC PANEL
Anion gap: 9 (ref 5–15)
BUN: 36 mg/dL — ABNORMAL HIGH (ref 8–23)
CO2: 30 mmol/L (ref 22–32)
Calcium: 8.4 mg/dL — ABNORMAL LOW (ref 8.9–10.3)
Chloride: 100 mmol/L (ref 98–111)
Creatinine, Ser: 1.49 mg/dL — ABNORMAL HIGH (ref 0.44–1.00)
GFR calc Af Amer: 43 mL/min — ABNORMAL LOW (ref >60)
GFR calc non Af Amer: 37 mL/min — ABNORMAL LOW (ref >60)
Glucose, Bld: 133 mg/dL — ABNORMAL HIGH (ref 70–99)
Potassium: 3.9 mmol/L (ref 3.5–5.1)
Sodium: 139 mmol/L (ref 135–145)

## 2020-05-20 LAB — COOXEMETRY PANEL
Carboxyhemoglobin: 1.6 % — ABNORMAL HIGH (ref 0.5–1.5)
Methemoglobin: 0.7 % (ref 0.0–1.5)
O2 Saturation: 69.2 %
Total hemoglobin: 12.7 g/dL (ref 12.0–16.0)

## 2020-05-20 LAB — GLUCOSE, CAPILLARY
Glucose-Capillary: 120 mg/dL — ABNORMAL HIGH (ref 70–99)
Glucose-Capillary: 134 mg/dL — ABNORMAL HIGH (ref 70–99)
Glucose-Capillary: 162 mg/dL — ABNORMAL HIGH (ref 70–99)
Glucose-Capillary: 102 mg/dL — ABNORMAL HIGH (ref 70–99)

## 2020-05-20 MED ORDER — ALUM & MAG HYDROXIDE-SIMETH 200-200-20 MG/5ML PO SUSP
30.0000 mL | ORAL | Status: DC | PRN
  Administered 2020-05-20: 30 mL via ORAL
  Filled 2020-05-20: qty 30

## 2020-05-20 MED ORDER — METOLAZONE 5 MG PO TABS
5.0000 mg | ORAL_TABLET | Freq: Every day | ORAL | Status: DC
  Administered 2020-05-20 – 2020-05-22 (×3): 5 mg via ORAL
  Filled 2020-05-20 (×3): qty 1

## 2020-05-20 NOTE — Progress Notes (Signed)
Progress Note  Patient Name: Maureen Jackson Date of Encounter: 05/20/2020  Primary Cardiologist: Carlyle Dolly, MD  Subjective   Not happy aches all over especially back   Inpatient Medications    Scheduled Meds: . aspirin EC  81 mg Oral Daily  . atorvastatin  80 mg Oral q1800  . carvedilol  6.25 mg Oral BID WC  . Chlorhexidine Gluconate Cloth  6 each Topical Daily  . clopidogrel  75 mg Oral Daily  . empagliflozin  10 mg Oral Daily  . enoxaparin (LOVENOX) injection  75 mg Subcutaneous Q24H  . furosemide  60 mg Intravenous BID  . insulin aspart  18 Units Subcutaneous TID WC  . insulin glargine  60 Units Subcutaneous Daily  . levothyroxine  200 mcg Oral QAC breakfast  . potassium chloride  10 mEq Oral Daily  . sacubitril-valsartan  1 tablet Oral BID  . sodium chloride flush  10-40 mL Intracatheter Q12H  . sodium chloride flush  3 mL Intravenous Q12H  . timolol  1 drop Both Eyes BID   Continuous Infusions: . sodium chloride     PRN Meds: sodium chloride, acetaminophen, albuterol, methocarbamol, ondansetron (ZOFRAN) IV, ondansetron, sodium chloride flush, sodium chloride flush, traMADol   Vital Signs    Vitals:   05/19/20 1956 05/19/20 2315 05/20/20 0410 05/20/20 0500  BP:  104/66 (!) 120/58   Pulse:  72 72   Resp:  20 16   Temp:  98.1 F (36.7 C) 98 F (36.7 C)   TempSrc:  Oral Oral   SpO2: 98% 93% 94%   Weight:    (!) 154.3 kg  Height:        Intake/Output Summary (Last 24 hours) at 05/20/2020 0759 Last data filed at 05/19/2020 2116 Gross per 24 hour  Intake 920 ml  Output --  Net 920 ml   Filed Weights   05/17/20 1805 05/19/20 0511 05/20/20 0500  Weight: (!) 152 kg (!) 157.4 kg (!) 154.3 kg    Telemetry    Sinus rhythm.  Personally reviewed.  ECG    An ECG dated 05/18/2020 was personally reviewed today and demonstrated:  Sinus rhythm with left bundle branch block.  Physical Exam   Depressed Obese black female  HEENT: normal Neck supple  with no adenopathy JVP normal no bruits no thyromegaly Lungs clear with no wheezing and good diaphragmatic motion Heart:  S1/S2 no murmur, no rub, gallop or click PMI normal Abdomen: benighn, BS positve, no tenderness, no AAA no bruit.  No HSM or HJR Distal pulses intact with no bruits Plus 2  edema Neuro non-focal Skin warm and dry No muscular weakness PIC line RUE   Labs    Chemistry Recent Labs  Lab 05/17/20 2107 05/19/20 0537 05/20/20 0426  NA 141 140 139  K 4.1 4.1 3.9  CL 105 101 100  CO2 26 29 30   GLUCOSE 134* 145* 133*  BUN 22 30* 36*  CREATININE 1.21* 1.33* 1.49*  CALCIUM 9.0 8.5* 8.4*  GFRNONAA 48* 43* 37*  GFRAA 56* 50* 43*  ANIONGAP 10 10 9      Hematology Recent Labs  Lab 05/17/20 2107  WBC 7.4  RBC 4.43  HGB 12.8  HCT 40.8  MCV 92.1  MCH 28.9  MCHC 31.4  RDW 13.6  PLT 237    Cardiac Enzymes Recent Labs  Lab 05/17/20 2107 05/18/20 0015  TROPONINIHS 9 8    BNP Recent Labs  Lab 05/17/20 2107  BNP 132.0*  Radiology    US Venous Img Lower Unilateral Right (DVT)  Result Date: 05/18/2020 CLINICAL DATA:  Right lower extremity pain and edema. EXAM: RIGHT LOWER EXTREMITY VENOUS DOPPLER ULTRASOUND TECHNIQUE: Gray-scale sonography with graded compression, as well as color Doppler and duplex ultrasound were performed to evaluate the lower extremity deep venous systems from the level of the common femoral vein and including the common femoral, femoral, profunda femoral, popliteal and calf veins including the posterior tibial, peroneal and gastrocnemius veins when visible. The superficial great saphenous vein was also interrogated. Spectral Doppler was utilized to evaluate flow at rest and with distal augmentation maneuvers in the common femoral, femoral and popliteal veins. COMPARISON:  None. FINDINGS: Contralateral Common Femoral Vein: Respiratory phasicity is normal and symmetric with the symptomatic side. No evidence of thrombus. Normal  compressibility. Common Femoral Vein: No evidence of thrombus. Normal compressibility, respiratory phasicity and response to augmentation. Saphenofemoral Junction: No evidence of thrombus. Normal compressibility and flow on color Doppler imaging. Profunda Femoral Vein: No evidence of thrombus. Normal compressibility and flow on color Doppler imaging. Femoral Vein: No evidence of thrombus. Normal compressibility, respiratory phasicity and response to augmentation. Popliteal Vein: No evidence of thrombus. Normal compressibility, respiratory phasicity and response to augmentation. Calf Veins: No evidence of thrombus. Normal compressibility and flow on color Doppler imaging. Superficial Great Saphenous Vein: No evidence of thrombus. Normal compressibility. Venous Reflux:  None. Other Findings: No evidence of superficial thrombophlebitis or abnormal fluid collection. IMPRESSION: No evidence of right lower extremity deep venous thrombosis. Electronically Signed   By: Aletta Edouard M.D.   On: 05/18/2020 12:19   DG Knee Complete 4 Views Right  Result Date: 05/19/2020 CLINICAL DATA:  Right knee pain for 2 weeks EXAM: RIGHT KNEE - COMPLETE 4+ VIEW COMPARISON:  02/14/2016 FINDINGS: Frontal, bilateral oblique, lateral views of the right knee are obtained. No fracture, subluxation, or dislocation. There is 3 compartmental osteoarthritis most pronounced in the patellofemoral compartment. This is progressive since prior study. Small joint effusion. Mild diffuse subcutaneous edema. IMPRESSION: 1. 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 2. Small effusion. 3. Diffuse subcutaneous edema. Electronically Signed   By: Randa Ngo M.D.   On: 05/19/2020 15:03   ECHOCARDIOGRAM COMPLETE  Result Date: 05/18/2020    ECHOCARDIOGRAM REPORT   Patient Name:   NIAJAH Jackson Date of Exam: 05/18/2020 Medical Rec #:  [id number]       Height:       65.0 in Accession #:    [id number]      Weight:        335.0 lb Date of Birth:  1955/12/22        BSA:          2.463 m Patient Age:    64 years        BP:           128/71 mmHg Patient Gender: F               HR:           78 bpm. Exam Location:  [facility] Procedure: 2D Echo Indications:    CHF-Acute Systolic 144.31 / V40.08  History:        Patient has prior history of Echocardiogram examinations, most                 recent 12/16/2018. CAD, TIA, Signs/Symptoms:Chest Pain; Risk                 Factors:Hypertension,  Former Smoker, Diabetes and Dyslipidemia.  Sonographer:    Leavy Cella RDCS (AE) Referring Phys: [place]  1. Images are limited. Suggest Definity contrast when adequate IV access available.  2. Left ventricular ejection fraction, by estimation, is approximately 30%. The left ventricle has severely decreased function. Left ventricular endocardial border not optimally defined to evaluate regional wall motion. There is moderate left ventricular hypertrophy. Left ventricular diastolic parameters are indeterminate.  3. Right ventricular systolic function is normal. The right ventricular size is normal.  4. Moderate pericardial effusion. The pericardial effusion is circumferential.  5. The mitral valve is grossly normal. Trivial mitral valve regurgitation.  6. The aortic valve is tricuspid. Aortic valve regurgitation is not visualized.  7. The inferior vena cava is normal in size with greater than 50% respiratory variability, suggesting right atrial pressure of 3 mmHg. FINDINGS  Left Ventricle: Left ventricular ejection fraction, by estimation, is 30%. The left ventricle has severely decreased function. Left ventricular endocardial border not optimally defined to evaluate regional wall motion. The left ventricular internal cavity size was normal in size. There is moderate left ventricular hypertrophy. Left ventricular diastolic parameters are indeterminate. Right Ventricle: The right ventricular size is normal. No increase in right  ventricular wall thickness. Right ventricular systolic function is normal. Left Atrium: Left atrial size was normal in size. Right Atrium: Right atrial size was normal in size. Pericardium: A moderately sized pericardial effusion is present. The pericardial effusion is circumferential. Mitral Valve: The mitral valve is grossly normal. Trivial mitral valve regurgitation. Tricuspid Valve: The tricuspid valve is not well visualized. Tricuspid valve regurgitation is trivial. Aortic Valve: The aortic valve is tricuspid. Aortic valve regurgitation is not visualized. Mild aortic valve annular calcification. Pulmonic Valve: The pulmonic valve was not well visualized. Pulmonic valve regurgitation is not visualized. Aorta: The aortic root is normal in size and structure. Venous: The inferior vena cava is normal in size with greater than 50% respiratory variability, suggesting right atrial pressure of 3 mmHg. IAS/Shunts: No atrial level shunt detected by color flow Doppler.  LEFT VENTRICLE PLAX 2D LVIDd:         5.38 cm  Diastology LVIDs:         3.57 cm  LV e' lateral:   4.05 cm/s LV PW:         1.69 cm  LV E/e' lateral: 15.6 LV IVS:        1.47 cm  LV e' medial:    3.98 cm/s LVOT diam:     2.10 cm  LV E/e' medial:  15.9 LVOT Area:     3.46 cm  RIGHT VENTRICLE RV S prime:     8.50 cm/s TAPSE (M-mode): 1.9 cm LEFT ATRIUM           Index       RIGHT ATRIUM           Index LA diam:      3.80 cm 1.54 cm/m  RA Area:     18.30 cm LA Vol (A2C): 48.1 ml 19.53 ml/m RA Volume:   56.10 ml  22.78 ml/m LA Vol (A4C): 63.4 ml 25.74 ml/m   AORTA Ao Root diam: 2.70 cm MITRAL VALVE MV Area (PHT): 4.39 cm    SHUNTS MV Decel Time: 173 msec    Systemic Diam: 2.10 cm MV E velocity: 63.10 cm/s MV A velocity: 61.10 cm/s MV E/A ratio:  1.03 Rozann Lesches MD Electronically signed by Rozann Lesches MD Signature Date/Time: 05/18/2020/12:55:38  PM    Final    [place] EKG SITE RITE  Result Date: 05/18/2020 If Site Rite image not attached, placement  could not be confirmed due to current cardiac rhythm.   Patient Profile     64 y.o. female with past medical history of CAD (s/p DES to LAD and RCA in 09/2017), ischemic cardiomyopathy(EF 30-35% in 09/2017, at 35-40% by repeat in 12/2018), HTN, HLD, Type 2 DM, Stage 2-3 CKD, and history of COVID PNA (admitted in 12/2019) who is being seen for the evaluation of CHF.   Assessment & Plan    1.  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  Echocardiogram from yesterday indicated LVEF approximately 30% with very limited images, follow-up I/O ? Accuracy add zaroxyln to iv lasix check coox don;t think It will be low enough to consider milrinone On entresto   2.  CAD status post DES to the LAD and RCA in 2018.  She does not report any active angina at this time, ECG with old left bundle branch block and negative high-sensitivity troponin I levels.  3.  CKD stage II-III.  Creatinine 1.49 with normal potassium.  4.  Essential hypertension 100-115 range.    Signed, Jenkins Rouge, MD  05/20/2020, 7:59 AM

## 2020-05-20 NOTE — Progress Notes (Addendum)
PROGRESS NOTE    Maureen Jackson:[id number] DOB: 1956/10/09 DOA: 05/17/2020 PCP: Maureen Burly, MD    Brief Narrative:  Maureen Jackson is a 64 y.o. female with medical history significant of chronic systolic HF with last LVEF of 35% in 2020, CAD, HTN, morbid obesity. She reports increased leg swelling for several weeks with pain on the right. She did have an outpatient LE venous doppler 04/30/20 negative for DVT. For several days she has had increasing dyspnea on exertion. Entresto and spiranolactone were preveiously stopped 2/2 rise in creatinine but her cardiologist,Dr. Harl Jackson [facility], has restarted these medications. She does not c/o chest pain. Because of these progressive symptoms she presents to [facility] for evaluation.    Assessment & Plan:   Active Problems:   Hypothyroidism   Essential hypertension   GERD   Diabetes mellitus type 2 with complications, uncontrolled ([place])   Acute combined systolic (congestive) and diastolic (congestive) heart failure (HCC)   Hyperlipidemia   Acute on chronic combined systolic (congestive) and diastolic (congestive) heart failure (HCC)   Chronic combined CHF.  Ejection fraction 30%.  Continues to have evidence of volume overload.  She is on intravenous Lasix.  Cardiology following.  Started on metolazone.  Continue to monitor intake and output.  Continue Coreg, Entresto Hypertension.  Blood pressure currently stable.  Continue on Coreg Diabetes mellitus, type II, uncontrolled with hyperglycemia.  Oral medication include Jardiance.  Chronically on Tresiba as well as NovoLog with meals.  We will continue on basal and bolus insulin. Hyperlipidemia.  Continue on statin Hypothyroidism. continue Synthroid. CAD.  No complaints of chest pain.  Continue antiplatelet agents with aspirin and Plavix. Right knee pain.  Venous Dopplers negative for DVT.  X-ray of right knee without any acute process.  Does not report any trauma.  Continue supportive  measures. Chronic kidney disease stage IIIa.  Baseline creatinine around 1.0.  Continue to monitor in the setting of diuresis.    DVT prophylaxis: Lovenox Code Status: Full code Family Communication: Discussed with daughter over the phone Disposition Plan: Status is: Inpatient  Remains inpatient appropriate because:IV treatments appropriate due to intensity of illness or inability to take PO   Dispo: The patient is from: Home              Anticipated d/c is to: Home              Anticipated d/c date is: 2 days              Patient currently is not medically stable to d/c.   Consultants:  Cardiology  Procedures:  Echo: 1. Images are limited. Suggest Definity contrast when adequate IV access  available.   2. Left ventricular ejection fraction, by estimation, is approximately  30%. The left ventricle has severely decreased function. Left ventricular  endocardial border not optimally defined to evaluate regional wall motion.  There is moderate left  ventricular hypertrophy. Left ventricular diastolic parameters are  indeterminate.   3. Right ventricular systolic function is normal. The right ventricular  size is normal.   4. Moderate pericardial effusion. The pericardial effusion is  circumferential.   5. The mitral valve is grossly normal. Trivial mitral valve  regurgitation.   6. The aortic valve is tricuspid. Aortic valve regurgitation is not  visualized.   7. The inferior vena cava is normal in size with greater than 50%  respiratory variability, suggesting right atrial pressure of 3 mmHg.   Antimicrobials:  Subjective: She says that she does not feel well today.  She has nausea and back pain.  Back pain is a chronic issue.  Continues have shortness of breath and does not feel that her breathing is back to baseline yet.  Objective: Vitals:   05/20/20 0410 05/20/20 0500 05/20/20 1347 05/20/20 1530  BP: (!) 120/58   124/79  Pulse: 72  70 72  Resp: 16  18 18     Temp: 98 F (36.7 C)   98.5 F (36.9 C)  TempSrc: Oral   Oral  SpO2: 94%  95% 99%  Weight:  (!) 154.3 kg    Height:        Intake/Output Summary (Last 24 hours) at 05/20/2020 1936 Last data filed at 05/20/2020 1901 Gross per 24 hour  Intake 440 ml  Output 2000 ml  Net -1560 ml   Filed Weights   05/17/20 1805 05/19/20 0511 05/20/20 0500  Weight: (!) 152 kg (!) 157.4 kg (!) 154.3 kg    Examination:  General exam: Alert, awake, oriented x 3 Respiratory system: Clear to auscultation. Respiratory effort normal. Cardiovascular system:RRR. No murmurs, rubs, gallops. Gastrointestinal system: Abdomen is nondistended, soft and nontender. No organomegaly or masses felt. Normal bowel sounds heard. Central nervous system: Alert and oriented. No focal neurological deficits. Extremities: 2+ pitting edema bilaterally Skin: No rashes, lesions or ulcers Psychiatry: Judgement and insight appear normal. Mood & affect appropriate.    Data Reviewed: I have personally reviewed following labs and imaging studies  CBC: Recent Labs  Lab 05/17/20 2107  WBC 7.4  HGB 12.8  HCT 40.8  MCV 92.1  PLT 062   Basic Metabolic Panel: Recent Labs  Lab 05/17/20 2107 05/18/20 0015 05/19/20 0537 05/20/20 0426  NA 141  --  140 139  K 4.1  --  4.1 3.9  CL 105  --  101 100  CO2 26  --  29 30  GLUCOSE 134*  --  145* 133*  BUN 22  --  30* 36*  CREATININE 1.21*  --  1.33* 1.49*  CALCIUM 9.0  --  8.5* 8.4*  MG  --  2.3 2.0  --    GFR: Estimated Creatinine Clearance: 59.3 mL/min (A) (by C-G formula based on SCr of 1.49 mg/dL (H)). Liver Function Tests: No results for input(s): AST, ALT, ALKPHOS, BILITOT, PROT, ALBUMIN in the last 168 hours. No results for input(s): LIPASE, AMYLASE in the last 168 hours. No results for input(s): AMMONIA in the last 168 hours. Coagulation Profile: No results for input(s): INR, PROTIME in the last 168 hours. Cardiac Enzymes: No results for input(s): CKTOTAL, CKMB,  CKMBINDEX, TROPONINI in the last 168 hours. BNP (last 3 results) No results for input(s): PROBNP in the last 8760 hours. HbA1C: No results for input(s): HGBA1C in the last 72 hours. CBG: Recent Labs  Lab 05/19/20 1604 05/19/20 2045 05/20/20 0807 05/20/20 1129 05/20/20 1551  GLUCAP 83 114* 120* 134* 162*   Lipid Profile: No results for input(s): CHOL, HDL, LDLCALC, TRIG, CHOLHDL, LDLDIRECT in the last 72 hours. Thyroid Function Tests: No results for input(s): TSH, T4TOTAL, FREET4, T3FREE, THYROIDAB in the last 72 hours. Anemia Panel: No results for input(s): VITAMINB12, FOLATE, FERRITIN, TIBC, IRON, RETICCTPCT in the last 72 hours. Sepsis Labs: No results for input(s): PROCALCITON, LATICACIDVEN in the last 168 hours.  Recent Results (from the past 240 hour(s))  SARS Coronavirus 2 by RT PCR (hospital order, performed in [facility] hospital lab) Nasopharyngeal Nasopharyngeal Swab  Status: None   Collection Time: 05/17/20 11:30 PM   Specimen: Nasopharyngeal Swab  Result Value Ref Range Status   SARS Coronavirus 2 NEGATIVE NEGATIVE Final    Comment: (NOTE) SARS-CoV-2 target nucleic acids are NOT DETECTED. The SARS-CoV-2 RNA is generally detectable in upper and lower respiratory specimens during the acute phase of infection. The lowest concentration of SARS-CoV-2 viral copies this assay can detect is 250 copies / mL. A negative result does not preclude SARS-CoV-2 infection and should not be used as the sole basis for treatment or other patient management decisions.  A negative result may occur with improper specimen collection / handling, submission of specimen other than nasopharyngeal swab, presence of viral mutation(s) within the areas targeted by this assay, and inadequate number of viral copies (<250 copies / mL). A negative result must be combined with clinical observations, patient history, and epidemiological information. Fact Sheet for Patients:     StrictlyIdeas.no Fact Sheet for Healthcare Providers: BankingDealers.co.za This test is not yet approved or cleared  by the [place] FDA and has been authorized for detection and/or diagnosis of SARS-CoV-2 by FDA under an Emergency Use Authorization (EUA).  This EUA will remain in effect (meaning this test can be used) for the duration of the COVID-19 declaration under Section 564(b)(1) of the Act, 21 U.S.C. section 360bbb-3(b)(1), unless the authorization is terminated or revoked sooner. Performed at [facility], [address], [place], [place] [address]          Radiology Studies: DG Knee Complete 4 Views Right  Result Date: 05/19/2020 CLINICAL DATA:  Right knee pain for 2 weeks EXAM: RIGHT KNEE - COMPLETE 4+ VIEW COMPARISON:  02/14/2016 FINDINGS: Frontal, bilateral oblique, lateral views of the right knee are obtained. No fracture, subluxation, or dislocation. There is 3 compartmental osteoarthritis most pronounced in the patellofemoral compartment. This is progressive since prior study. Small joint effusion. Mild diffuse subcutaneous edema. IMPRESSION: 1. 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 2. Small effusion. 3. Diffuse subcutaneous edema. Electronically Signed   By: Randa Ngo M.D.   On: 05/19/2020 15:03        Scheduled Meds:  aspirin EC  81 mg Oral Daily   atorvastatin  80 mg Oral q1800   carvedilol  6.25 mg Oral BID WC   Chlorhexidine Gluconate Cloth  6 each Topical Daily   clopidogrel  75 mg Oral Daily   empagliflozin  10 mg Oral Daily   enoxaparin (LOVENOX) injection  75 mg Subcutaneous Q24H   furosemide  60 mg Intravenous BID   insulin aspart  18 Units Subcutaneous TID WC   insulin glargine  60 Units Subcutaneous Daily   levothyroxine  200 mcg Oral QAC breakfast   metolazone  5 mg Oral Daily   potassium chloride  10 mEq Oral Daily   sacubitril-valsartan  1 tablet  Oral BID   sodium chloride flush  10-40 mL Intracatheter Q12H   sodium chloride flush  3 mL Intravenous Q12H   timolol  1 drop Both Eyes BID   Continuous Infusions:  sodium chloride       LOS: 2 days    Time spent: 69mins    Kathie Dike, MD [facility]   If 7PM-7AM, please contact night-coverage [email or web address]  05/20/2020, 7:36 PM

## 2020-05-21 LAB — BASIC METABOLIC PANEL
Anion gap: 12 (ref 5–15)
BUN: 45 mg/dL — ABNORMAL HIGH (ref 8–23)
CO2: 30 mmol/L (ref 22–32)
Calcium: 8.5 mg/dL — ABNORMAL LOW (ref 8.9–10.3)
Chloride: 95 mmol/L — ABNORMAL LOW (ref 98–111)
Creatinine, Ser: 1.68 mg/dL — ABNORMAL HIGH (ref 0.44–1.00)
GFR calc Af Amer: 37 mL/min — ABNORMAL LOW (ref >60)
GFR calc non Af Amer: 32 mL/min — ABNORMAL LOW (ref >60)
Glucose, Bld: 137 mg/dL — ABNORMAL HIGH (ref 70–99)
Potassium: 3.9 mmol/L (ref 3.5–5.1)
Sodium: 137 mmol/L (ref 135–145)

## 2020-05-21 LAB — GLUCOSE, CAPILLARY
Glucose-Capillary: 121 mg/dL — ABNORMAL HIGH (ref 70–99)
Glucose-Capillary: 128 mg/dL — ABNORMAL HIGH (ref 70–99)
Glucose-Capillary: 131 mg/dL — ABNORMAL HIGH (ref 70–99)
Glucose-Capillary: 104 mg/dL — ABNORMAL HIGH (ref 70–99)

## 2020-05-21 MED ORDER — NYSTATIN 100000 UNIT/GM EX POWD
Freq: Three times a day (TID) | CUTANEOUS | Status: DC
  Filled 2020-05-21 (×3): qty 15

## 2020-05-21 NOTE — Progress Notes (Addendum)
PROGRESS NOTE    Maureen Jackson  POE:[id number] DOB: 1956/11/08 DOA: 05/17/2020 PCP: Neale Burly, MD    Brief Narrative:  Maureen Jackson is a 64 y.o. female with medical history significant of chronic systolic HF with last LVEF of 35% in 2020, CAD, HTN, morbid obesity. She reports increased leg swelling for several weeks with pain on the right. She did have an outpatient LE venous doppler 04/30/20 negative for DVT. For several days she has had increasing dyspnea on exertion. Entresto and spironolactone were preveiously stopped 2/2 rise in creatinine but her cardiologist,Dr. Harl Bowie [facility], has restarted these medications. She does not c/o chest pain. Because of these progressive symptoms she presents to [facility] for evaluation.    Assessment & Plan:   Active Problems:   Hypothyroidism   Essential hypertension   GERD   Diabetes mellitus type 2 with complications, uncontrolled ([place])   Acute combined systolic (congestive) and diastolic (congestive) heart failure (HCC)   Hyperlipidemia   Acute on chronic combined systolic (congestive) and diastolic (congestive) heart failure ([place])   1. Acute on Chronic combined CHF.  Ejection fraction 30%.  Continues to have evidence of volume overload.  She is on intravenous Lasix.  Cardiology following.  Started on metolazone 6/10 and had good urine output.  Continue to monitor intake and output.  Continue Coreg, Entresto 2. Hypertension.  Blood pressure currently stable.  Continue on Coreg 3. Diabetes mellitus, type II, uncontrolled with hyperglycemia.  Oral medication include Jardiance.  Chronically on Tresiba as well as NovoLog with meals.  We will continue on basal and bolus insulin.  4. Hyperlipidemia.  Continue on statin 5. Hypothyroidism. continue Synthroid. 6. CAD.  No complaints of chest pain.  Continue antiplatelet agents with aspirin and Plavix. 7. Right knee pain.  Venous Dopplers negative for DVT.  X-ray of right knee without any acute  process.  Does not report any trauma. Reports some improvement in pain as she is diuresing. Continue supportive measures. 8. Chronic kidney disease stage IIIa.  Baseline creatinine around 1.0.  Continue to monitor in the setting of diuresis. Will likely need to tolerate higher creatinine in order to achieve adequate diuresis.    DVT prophylaxis: Lovenox Code Status: Full code Family Communication: Discussed with daughter over the phone 6/10 Disposition Plan: Status is: Inpatient  Remains inpatient appropriate because:IV treatments appropriate due to intensity of illness or inability to take PO   Dispo: The patient is from: Home              Anticipated d/c is to: Home              Anticipated d/c date is: 2 days              Patient currently is not medically stable to d/c.   Consultants:   Cardiology  Procedures:   Echo: 1. Images are limited. Suggest Definity contrast when adequate IV access  available.   2. Left ventricular ejection fraction, by estimation, is approximately  30%. The left ventricle has severely decreased function. Left ventricular  endocardial border not optimally defined to evaluate regional wall motion.  There is moderate left  ventricular hypertrophy. Left ventricular diastolic parameters are  indeterminate.   3. Right ventricular systolic function is normal. The right ventricular  size is normal.   4. Moderate pericardial effusion. The pericardial effusion is  circumferential.   5. The mitral valve is grossly normal. Trivial mitral valve  regurgitation.   6. The aortic valve  is tricuspid. Aortic valve regurgitation is not  visualized.   7. The inferior vena cava is normal in size with greater than 50%  respiratory variability, suggesting right atrial pressure of 3 mmHg.   Antimicrobials:       Subjective: Feels that shortness of breath has mildly improved, but not back to baseline. Continues to have leg pain on right leg, but feels it may be  improving.  Objective: Vitals:   05/20/20 2058 05/21/20 0435 05/21/20 0437 05/21/20 1400  BP: 104/83 118/78  119/75  Pulse: 72 75  72  Resp: 18 17  18   Temp: 98.8 F (37.1 C) 98.2 F (36.8 C)  98.2 F (36.8 C)  TempSrc: Oral Oral  Oral  SpO2: 95% 97%  97%  Weight:   (!) 156.1 kg   Height:        Intake/Output Summary (Last 24 hours) at 05/21/2020 1508 Last data filed at 05/21/2020 8127 Gross per 24 hour  Intake 570 ml  Output 2150 ml  Net -1580 ml   Filed Weights   05/19/20 0511 05/20/20 0500 05/21/20 0437  Weight: (!) 157.4 kg (!) 154.3 kg (!) 156.1 kg    Examination:  General exam: Alert, awake, oriented x 3 Respiratory system: crackles at bases. Respiratory effort normal. Cardiovascular system:RRR. No murmurs, rubs, gallops. Gastrointestinal system: Abdomen is nondistended, soft and nontender. No organomegaly or masses felt. Normal bowel sounds heard. Central nervous system: Alert and oriented. No focal neurological deficits. Extremities: 1+ edema bilaterally Skin: No rashes, lesions or ulcers Psychiatry: Judgement and insight appear normal. Mood & affect appropriate.     Data Reviewed: I have personally reviewed following labs and imaging studies  CBC: Recent Labs  Lab 05/17/20 2107  WBC 7.4  HGB 12.8  HCT 40.8  MCV 92.1  PLT 517   Basic Metabolic Panel: Recent Labs  Lab 05/17/20 2107 05/18/20 0015 05/19/20 0537 05/20/20 0426 05/21/20 0805  NA 141  --  140 139 137  K 4.1  --  4.1 3.9 3.9  CL 105  --  101 100 95*  CO2 26  --  29 30 30   GLUCOSE 134*  --  145* 133* 137*  BUN 22  --  30* 36* 45*  CREATININE 1.21*  --  1.33* 1.49* 1.68*  CALCIUM 9.0  --  8.5* 8.4* 8.5*  MG  --  2.3 2.0  --   --    GFR: Estimated Creatinine Clearance: 52.9 mL/min (A) (by C-G formula based on SCr of 1.68 mg/dL (H)). Liver Function Tests: No results for input(s): AST, ALT, ALKPHOS, BILITOT, PROT, ALBUMIN in the last 168 hours. No results for input(s): LIPASE,  AMYLASE in the last 168 hours. No results for input(s): AMMONIA in the last 168 hours. Coagulation Profile: No results for input(s): INR, PROTIME in the last 168 hours. Cardiac Enzymes: No results for input(s): CKTOTAL, CKMB, CKMBINDEX, TROPONINI in the last 168 hours. BNP (last 3 results) No results for input(s): PROBNP in the last 8760 hours. HbA1C: No results for input(s): HGBA1C in the last 72 hours. CBG: Recent Labs  Lab 05/20/20 1129 05/20/20 1551 05/20/20 2055 05/21/20 0744 05/21/20 1134  GLUCAP 134* 162* 102* 121* 128*   Lipid Profile: No results for input(s): CHOL, HDL, LDLCALC, TRIG, CHOLHDL, LDLDIRECT in the last 72 hours. Thyroid Function Tests: No results for input(s): TSH, T4TOTAL, FREET4, T3FREE, THYROIDAB in the last 72 hours. Anemia Panel: No results for input(s): VITAMINB12, FOLATE, FERRITIN, TIBC, IRON, RETICCTPCT in the last  72 hours. Sepsis Labs: No results for input(s): PROCALCITON, LATICACIDVEN in the last 168 hours.  Recent Results (from the past 240 hour(s))  SARS Coronavirus 2 by RT PCR (hospital order, performed in [facility] hospital lab) Nasopharyngeal Nasopharyngeal Swab     Status: None   Collection Time: 05/17/20 11:30 PM   Specimen: Nasopharyngeal Swab  Result Value Ref Range Status   SARS Coronavirus 2 NEGATIVE NEGATIVE Final    Comment: (NOTE) SARS-CoV-2 target nucleic acids are NOT DETECTED. The SARS-CoV-2 RNA is generally detectable in upper and lower respiratory specimens during the acute phase of infection. The lowest concentration of SARS-CoV-2 viral copies this assay can detect is 250 copies / mL. A negative result does not preclude SARS-CoV-2 infection and should not be used as the sole basis for treatment or other patient management decisions.  A negative result may occur with improper specimen collection / handling, submission of specimen other than nasopharyngeal swab, presence of viral mutation(s) within the areas targeted by  this assay, and inadequate number of viral copies (<250 copies / mL). A negative result must be combined with clinical observations, patient history, and epidemiological information. Fact Sheet for Patients:   StrictlyIdeas.no Fact Sheet for Healthcare Providers: BankingDealers.co.za This test is not yet approved or cleared  by the [place] FDA and has been authorized for detection and/or diagnosis of SARS-CoV-2 by FDA under an Emergency Use Authorization (EUA).  This EUA will remain in effect (meaning this test can be used) for the duration of the COVID-19 declaration under Section 564(b)(1) of the Act, 21 U.S.C. section 360bbb-3(b)(1), unless the authorization is terminated or revoked sooner. Performed at [facility], [address], [place], [place] [address]          Radiology Studies: No results found.      Scheduled Meds: . aspirin EC  81 mg Oral Daily  . atorvastatin  80 mg Oral q1800  . carvedilol  6.25 mg Oral BID WC  . Chlorhexidine Gluconate Cloth  6 each Topical Daily  . clopidogrel  75 mg Oral Daily  . empagliflozin  10 mg Oral Daily  . enoxaparin (LOVENOX) injection  75 mg Subcutaneous Q24H  . furosemide  60 mg Intravenous BID  . insulin aspart  18 Units Subcutaneous TID WC  . insulin glargine  60 Units Subcutaneous Daily  . levothyroxine  200 mcg Oral QAC breakfast  . metolazone  5 mg Oral Daily  . nystatin   Topical TID  . potassium chloride  10 mEq Oral Daily  . sacubitril-valsartan  1 tablet Oral BID  . sodium chloride flush  10-40 mL Intracatheter Q12H  . sodium chloride flush  3 mL Intravenous Q12H  . timolol  1 drop Both Eyes BID   Continuous Infusions: . sodium chloride       LOS: 3 days    Time spent: 58mins    Kathie Dike, MD [facility]   If 7PM-7AM, please contact night-coverage [email or web address]  05/21/2020, 3:08 PM

## 2020-05-21 NOTE — Progress Notes (Signed)
Progress Note  Patient Name: Maureen Jackson Date of Encounter: 05/21/2020  Primary Cardiologist: Carlyle Dolly, MD  Subjective   Pruritis skin less dyspnea back pain better   Inpatient Medications    Scheduled Meds: . aspirin EC  81 mg Oral Daily  . atorvastatin  80 mg Oral q1800  . carvedilol  6.25 mg Oral BID WC  . Chlorhexidine Gluconate Cloth  6 each Topical Daily  . clopidogrel  75 mg Oral Daily  . empagliflozin  10 mg Oral Daily  . enoxaparin (LOVENOX) injection  75 mg Subcutaneous Q24H  . furosemide  60 mg Intravenous BID  . insulin aspart  18 Units Subcutaneous TID WC  . insulin glargine  60 Units Subcutaneous Daily  . levothyroxine  200 mcg Oral QAC breakfast  . metolazone  5 mg Oral Daily  . potassium chloride  10 mEq Oral Daily  . sacubitril-valsartan  1 tablet Oral BID  . sodium chloride flush  10-40 mL Intracatheter Q12H  . sodium chloride flush  3 mL Intravenous Q12H  . timolol  1 drop Both Eyes BID   Continuous Infusions: . sodium chloride     PRN Meds: sodium chloride, acetaminophen, albuterol, alum & mag hydroxide-simeth, methocarbamol, ondansetron (ZOFRAN) IV, ondansetron, sodium chloride flush, sodium chloride flush, traMADol   Vital Signs    Vitals:   05/20/20 1918 05/20/20 2058 05/21/20 0435 05/21/20 0437  BP:  104/83 118/78   Pulse:  72 75   Resp:  18 17   Temp:  98.8 F (37.1 C) 98.2 F (36.8 C)   TempSrc:  Oral Oral   SpO2: 97% 95% 97%   Weight:    (!) 156.1 kg  Height:        Intake/Output Summary (Last 24 hours) at 05/21/2020 0743 Last data filed at 05/21/2020 4709 Gross per 24 hour  Intake 570 ml  Output 3150 ml  Net -2580 ml   Filed Weights   05/19/20 0511 05/20/20 0500 05/21/20 0437  Weight: (!) 157.4 kg (!) 154.3 kg (!) 156.1 kg    Telemetry    Sinus rhythm.  Personally reviewed.  ECG    An ECG dated 05/18/2020 was personally reviewed today and demonstrated:  Sinus rhythm with left bundle branch  block.  Physical Exam   Depressed Obese black female  HEENT: normal Neck supple with no adenopathy JVP normal no bruits no thyromegaly Lungs clear with no wheezing and good diaphragmatic motion Heart:  S1/S2 no murmur, no rub, gallop or click PMI normal Abdomen: benighn, BS positve, no tenderness, no AAA no bruit.  No HSM or HJR Distal pulses intact with no bruits Plus 2  edema Neuro non-focal Skin warm and dry No muscular weakness PIC line RUE   Labs    Chemistry Recent Labs  Lab 05/17/20 2107 05/19/20 0537 05/20/20 0426  NA 141 140 139  K 4.1 4.1 3.9  CL 105 101 100  CO2 26 29 30   GLUCOSE 134* 145* 133*  BUN 22 30* 36*  CREATININE 1.21* 1.33* 1.49*  CALCIUM 9.0 8.5* 8.4*  GFRNONAA 48* 43* 37*  GFRAA 56* 50* 43*  ANIONGAP 10 10 9      Hematology Recent Labs  Lab 05/17/20 2107  WBC 7.4  RBC 4.43  HGB 12.8  HCT 40.8  MCV 92.1  MCH 28.9  MCHC 31.4  RDW 13.6  PLT 237    Cardiac Enzymes Recent Labs  Lab 05/17/20 2107 05/18/20 0015  TROPONINIHS 9 8  BNP Recent Labs  Lab 05/17/20 2107  BNP 132.0*     Radiology    DG Knee Complete 4 Views Right  Result Date: 05/19/2020 CLINICAL DATA:  Right knee pain for 2 weeks EXAM: RIGHT KNEE - COMPLETE 4+ VIEW COMPARISON:  02/14/2016 FINDINGS: Frontal, bilateral oblique, lateral views of the right knee are obtained. No fracture, subluxation, or dislocation. There is 3 compartmental osteoarthritis most pronounced in the patellofemoral compartment. This is progressive since prior study. Small joint effusion. Mild diffuse subcutaneous edema. IMPRESSION: 1. 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 2. Small effusion. 3. Diffuse subcutaneous edema. Electronically Signed   By: Randa Ngo M.D.   On: 05/19/2020 15:03    Patient Profile     64 y.o. female with past medical history of CAD (s/p DES to LAD and RCA in 09/2017), ischemic cardiomyopathy(EF 30-35% in 09/2017, at  35-40% by repeat in 12/2018), HTN, HLD, Type 2 DM, Stage 2-3 CKD, and history of COVID PNA (admitted in 12/2019) who is being seen for the evaluation of CHF.   Assessment & Plan    1.  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  Echocardiogram  indicated LVEF approximately 30% with very limited images, With addition of zaroxyln had 2.5 L out will continue with iv lasix and entresto Coox 69% would not appear to need milrinone at this time   2.  CAD status post DES to the LAD and RCA in 2018.  She does not report any active angina at this time, ECG with old left bundle branch block and negative high-sensitivity troponin I levels.  3.  CKD stage II-III.  Creatinine 1.49 with normal potassium.yesterday pending this am   4.  Essential hypertension 100-115 range.    Signed, Jenkins Rouge, MD  05/21/2020, 7:43 AM

## 2020-05-21 NOTE — Progress Notes (Signed)
Physical Therapy Treatment Patient Details Name: Maureen Jackson MRN: [id number] DOB: 30-Sep-1956 Today's Date: 05/21/2020    History of Present Illness Maureen Jackson is a 64 y.o. female with medical history significant of chronic systolic HF with last LVEF of 35% in 2020, CAD, HTN, morbid obesity. She reports increased leg swelling for several weeks with pain on the right. She did have an outpatient LE venous doppler 04/30/20 negative for DVT. For several days she has had increasing dyspnea on exertion. Entresto and spiranolactone were preveiously stopped 2/2 rise in creatinine but her cardiologist,Dr. Harl Bowie [facility], has restarted these medications. She does not c/o chest pain. Because of these progressive symptoms she presents to [facility] for evaluation.     PT Comments    Patient demonstrates good sitting tolerance seated EOB today. Patient instructed in exercises and only performs several of each exercise at a time secondary to overall fatigue today. She is agreeable to ambulate and is able to transfer to standing without assist or AD. She ambulates with a wide base of support without AD or loss of balance. Patient educated on continuing to perform seated exercises while in hosptial for improved circulation. Patient will benefit from continued physical therapy in hospital and recommended venue below to increase strength, balance, endurance for safe ADLs and gait.    Follow Up Recommendations  Home health PT;Supervision - Intermittent;Supervision for mobility/OOB     Equipment Recommendations  None recommended by PT    Recommendations for Other Services       Precautions / Restrictions Precautions Precautions: Fall Restrictions Weight Bearing Restrictions: No    Mobility  Bed Mobility Overal bed mobility: Modified Independent             General bed mobility comments: seated EOB upon arrival  Transfers Overall transfer level: Needs assistance Equipment used:  None Transfers: Sit to/from [employer] Transfers Sit to Stand: Supervision Stand pivot transfers: Supervision       General transfer comment: supervision due to R knee pain in weight-bearing  Ambulation/Gait Ambulation/Gait assistance: Supervision Gait Distance (Feet): 40 Feet Assistive device: None Gait Pattern/deviations: Step-through pattern;Decreased stride length;Wide base of support Gait velocity: decreased   General Gait Details: ambulates to bathroom without AD, good steadiness, patient ambulates in hallway without loss of balance with wide base of support without AD; limited by knee pain and fatigue   Stairs             Wheelchair Mobility    Modified Rankin (Stroke Patients Only)       Balance Overall balance assessment: Needs assistance Sitting-balance support: Feet supported;No upper extremity supported Sitting balance-Leahy Scale: Good Sitting balance - Comments: seated EOB   Standing balance support: During functional activity;No upper extremity supported;Bilateral upper extremity supported Standing balance-Leahy Scale: Fair Standing balance comment: fair/good without AD                            Cognition Arousal/Alertness: Awake/alert Behavior During Therapy: WFL for tasks assessed/performed Overall Cognitive Status: Within Functional Limits for tasks assessed                                        Exercises General Exercises - Lower Extremity Ankle Circles/Pumps: AROM;Both;10 reps;Seated Long Arc Quad: AROM;Both;10 reps;Seated Hip Flexion/Marching: AROM;Both;10 reps;Seated Toe Raises: AROM;Both;10 reps;Seated Heel Raises: AROM;Both;10 reps;Seated    General Comments  Pertinent Vitals/Pain Pain Assessment: Faces Faces Pain Scale: Hurts little more Pain Location: R leg Pain Intervention(s): Limited activity within patient's tolerance;Monitored during session;Repositioned    Home Living                       Prior Function            PT Goals (current goals can now be found in the care plan section) Acute Rehab PT Goals Patient Stated Goal: return home PT Goal Formulation: With patient Time For Goal Achievement: 05/26/20 Potential to Achieve Goals: Good Progress towards PT goals: Progressing toward goals    Frequency    Min 3X/week      PT Plan Current plan remains appropriate    Co-evaluation              AM-PAC PT "6 Clicks" Mobility   Outcome Measure  Help needed turning from your back to your side while in a flat bed without using bedrails?: None Help needed moving from lying on your back to sitting on the side of a flat bed without using bedrails?: None Help needed moving to and from a bed to a chair (including a wheelchair)?: A Little Help needed standing up from a chair using your arms (e.g., wheelchair or bedside chair)?: A Little Help needed to walk in hospital room?: A Little Help needed climbing 3-5 steps with a railing? : A Little 6 Click Score: 20    End of Session   Activity Tolerance: Patient limited by pain;Patient limited by fatigue Patient left: in bed;with call bell/phone within reach (seated EOB) Nurse Communication: Mobility status PT Visit Diagnosis: Other abnormalities of gait and mobility (R26.89);Muscle weakness (generalized) (M62.81);Pain Pain - Right/Left: Right Pain - part of body: Knee     Time: 1364-3837 PT Time Calculation (min) (ACUTE ONLY): 15 min  Charges:  $Therapeutic Activity: 8-22 mins                     2:43 PM, 05/21/20 Mearl Latin PT, DPT Physical Therapist at [facility]

## 2020-05-21 NOTE — Care Management Important Message (Signed)
Important Message  Patient Details  Name: ANWYN KRIEGEL MRN: [id number] Date of Birth: Sep 07, 1956   Medicare Important Message Given:  Yes     Tommy Medal 05/21/2020, 1:30 PM

## 2020-05-21 NOTE — TOC Progression Note (Signed)
Transition of Care [facility]) - Progression Note   Patient Details  Name: Maureen Jackson MRN: [id number] Date of Birth: 1956/02/27  [facility]) CM/SW [place], LCSW Phone Number: 05/21/2020, 11:20 AM  Clinical Narrative: CSW spoke with patient regarding scheduling a follow up appointment with her PCP. Patient agreeable to CSW scheduling appointment and requested afternoons. CSW called Dr. Regina Eck office and spoke with Abigail Butts to schedule appointment. Appointment scheduled for 05/31/20 at [facility] requested that discharge summary be faxed to [phone]. Appointment added to follow up providers for discharge. Patient updated regarding appointment.  Expected Discharge Plan: Home/Self Care Barriers to Discharge: Continued Medical Work up  Expected Discharge Plan and Services Expected Discharge Plan: Home/Self Care In-house Referral: Clinical Social Work Living arrangements for the past 2 months: Apartment  Readmission Risk Interventions Readmission Risk Prevention Plan 05/19/2020 09/18/2019  Transportation Screening Complete Complete  PCP or Specialist Appt within 3-5 Days Not Complete -  HRI or [place] Complete Complete  Social Work Consult for [place] Planning/Counseling Complete Complete  Palliative Care Screening Not Applicable Not Applicable  Medication Review Press photographer) Complete Complete  Some recent data might be hidden

## 2020-05-22 LAB — GLUCOSE, CAPILLARY
Glucose-Capillary: 119 mg/dL — ABNORMAL HIGH (ref 70–99)
Glucose-Capillary: 127 mg/dL — ABNORMAL HIGH (ref 70–99)
Glucose-Capillary: 81 mg/dL (ref 70–99)
Glucose-Capillary: 98 mg/dL (ref 70–99)

## 2020-05-22 LAB — BASIC METABOLIC PANEL
Anion gap: 13 (ref 5–15)
BUN: 54 mg/dL — ABNORMAL HIGH (ref 8–23)
CO2: 30 mmol/L (ref 22–32)
Calcium: 8.5 mg/dL — ABNORMAL LOW (ref 8.9–10.3)
Chloride: 95 mmol/L — ABNORMAL LOW (ref 98–111)
Creatinine, Ser: 1.93 mg/dL — ABNORMAL HIGH (ref 0.44–1.00)
GFR calc Af Amer: 32 mL/min — ABNORMAL LOW (ref >60)
GFR calc non Af Amer: 27 mL/min — ABNORMAL LOW (ref >60)
Glucose, Bld: 112 mg/dL — ABNORMAL HIGH (ref 70–99)
Potassium: 3.7 mmol/L (ref 3.5–5.1)
Sodium: 138 mmol/L (ref 135–145)

## 2020-05-22 NOTE — Progress Notes (Signed)
PROGRESS NOTE    Maureen Jackson  UXL:[id number] DOB: 1956/08/25 DOA: 05/17/2020 PCP: Neale Burly, MD    Brief Narrative:  Maureen Jackson is a 64 y.o. female with medical history significant of chronic systolic HF with last LVEF of 35% in 2020, CAD, HTN, morbid obesity. She reports increased leg swelling for several weeks with pain on the right. She did have an outpatient LE venous doppler 04/30/20 negative for DVT. For several days she has had increasing dyspnea on exertion. Entresto and spironolactone were preveiously stopped 2/2 rise in creatinine but her cardiologist,Dr. Harl Bowie [facility], has restarted these medications. She does not c/o chest pain. Because of these progressive symptoms she presents to [facility] for evaluation.    Assessment & Plan:   Active Problems:   Hypothyroidism   Essential hypertension   GERD   Diabetes mellitus type 2 with complications, uncontrolled ([place])   Acute combined systolic (congestive) and diastolic (congestive) heart failure (HCC)   Hyperlipidemia   Acute on chronic combined systolic (congestive) and diastolic (congestive) heart failure ([place])   1. Acute on Chronic combined CHF.  Ejection fraction 30%.  Continues to have evidence of volume overload.  She is on intravenous Lasix.  Cardiology following.  Started on metolazone 6/10 and had good urine output.  Since creatinine is trending up, will hold Entresto and diuretics for today. 2. Hypertension.  Blood pressure currently stable.  Continue on Coreg 3. Diabetes mellitus, type II, uncontrolled with hyperglycemia.  Oral medication include Jardiance.  Chronically on Tresiba as well as NovoLog with meals.  We will continue on basal and bolus insulin.  4. Hyperlipidemia.  Continue on statin 5. Hypothyroidism. continue Synthroid. 6. CAD.  No complaints of chest pain.  Continue antiplatelet agents with aspirin and Plavix. 7. Right knee pain.  Venous Dopplers negative for DVT.  X-ray of right knee  without any acute process.  Does not report any trauma.  Feels the pain medications do help the pain, but when the medication wears off then the pain returns.  She is having difficulty ambulating as well as sitting still because of the pain.  Will check ABIs. 8. AKI chronic kidney disease stage IIIa.  Baseline creatinine around 1.0.  Creatinine is trending up because of diuretics.  Hold diuretics for today.    DVT prophylaxis: Lovenox Code Status: Full code Family Communication: Discussed with daughter over the phone 6/10 Disposition Plan: Status is: Inpatient  Remains inpatient appropriate because:IV treatments appropriate due to intensity of illness or inability to take PO   Dispo: The patient is from: Home              Anticipated d/c is to: Home              Anticipated d/c date is: 2 days              Patient currently is not medically stable to d/c.   Consultants:   Cardiology  Procedures:   Echo: 1. Images are limited. Suggest Definity contrast when adequate IV access  available.   2. Left ventricular ejection fraction, by estimation, is approximately  30%. The left ventricle has severely decreased function. Left ventricular  endocardial border not optimally defined to evaluate regional wall motion.  There is moderate left  ventricular hypertrophy. Left ventricular diastolic parameters are  indeterminate.   3. Right ventricular systolic function is normal. The right ventricular  size is normal.   4. Moderate pericardial effusion. The pericardial effusion is  circumferential.  5. The mitral valve is grossly normal. Trivial mitral valve  regurgitation.   6. The aortic valve is tricuspid. Aortic valve regurgitation is not  visualized.   7. The inferior vena cava is normal in size with greater than 50%  respiratory variability, suggesting right atrial pressure of 3 mmHg.   Antimicrobials:       Subjective: Feels that breathing is improving.  Continues to complain  of pain in her right leg.  She appears to be very uncomfortable related to pain.  Says that she has to sit up because of pain.  Has difficulty walking.  Laying down makes the pain worse.  Objective: Vitals:   05/21/20 2056 05/22/20 0514 05/22/20 0600 05/22/20 1639  BP: 105/72 109/62  113/77  Pulse: 70 70  70  Resp: 18 17  17   Temp: 98.2 F (36.8 C) 97.8 F (36.6 C)  98 F (36.7 C)  TempSrc: Oral Oral  Oral  SpO2: 95% 97%  96%  Weight:   (!) 156.1 kg   Height:        Intake/Output Summary (Last 24 hours) at 05/22/2020 2017 Last data filed at 05/22/2020 1834 Gross per 24 hour  Intake 300 ml  Output 2250 ml  Net -1950 ml   Filed Weights   05/20/20 0500 05/21/20 0437 05/22/20 0600  Weight: (!) 154.3 kg (!) 156.1 kg (!) 156.1 kg    Examination:  General exam: Alert, awake, oriented x 3 Respiratory system: Clear to auscultation. Respiratory effort normal. Cardiovascular system:RRR. No murmurs, rubs, gallops. Gastrointestinal system: Abdomen is nondistended, soft and nontender. No organomegaly or masses felt. Normal bowel sounds heard. Central nervous system: Alert and oriented. No focal neurological deficits. Extremities: No C/C/E, +pedal pulses Skin: No rashes, lesions or ulcers Psychiatry: Judgement and insight appear normal. Mood & affect appropriate.       Data Reviewed: I have personally reviewed following labs and imaging studies  CBC: Recent Labs  Lab 05/17/20 2107  WBC 7.4  HGB 12.8  HCT 40.8  MCV 92.1  PLT 295   Basic Metabolic Panel: Recent Labs  Lab 05/17/20 2107 05/18/20 0015 05/19/20 0537 05/20/20 0426 05/21/20 0805 05/22/20 0657  NA 141  --  140 139 137 138  K 4.1  --  4.1 3.9 3.9 3.7  CL 105  --  101 100 95* 95*  CO2 26  --  29 30 30 30   GLUCOSE 134*  --  145* 133* 137* 112*  BUN 22  --  30* 36* 45* 54*  CREATININE 1.21*  --  1.33* 1.49* 1.68* 1.93*  CALCIUM 9.0  --  8.5* 8.4* 8.5* 8.5*  MG  --  2.3 2.0  --   --   --    GFR: Estimated  Creatinine Clearance: 46.1 mL/min (A) (by C-G formula based on SCr of 1.93 mg/dL (H)). Liver Function Tests: No results for input(s): AST, ALT, ALKPHOS, BILITOT, PROT, ALBUMIN in the last 168 hours. No results for input(s): LIPASE, AMYLASE in the last 168 hours. No results for input(s): AMMONIA in the last 168 hours. Coagulation Profile: No results for input(s): INR, PROTIME in the last 168 hours. Cardiac Enzymes: No results for input(s): CKTOTAL, CKMB, CKMBINDEX, TROPONINI in the last 168 hours. BNP (last 3 results) No results for input(s): PROBNP in the last 8760 hours. HbA1C: No results for input(s): HGBA1C in the last 72 hours. CBG: Recent Labs  Lab 05/21/20 1645 05/21/20 2059 05/22/20 0733 05/22/20 1120 05/22/20 1651  GLUCAP 131* 104*  119* 127* 81   Lipid Profile: No results for input(s): CHOL, HDL, LDLCALC, TRIG, CHOLHDL, LDLDIRECT in the last 72 hours. Thyroid Function Tests: No results for input(s): TSH, T4TOTAL, FREET4, T3FREE, THYROIDAB in the last 72 hours. Anemia Panel: No results for input(s): VITAMINB12, FOLATE, FERRITIN, TIBC, IRON, RETICCTPCT in the last 72 hours. Sepsis Labs: No results for input(s): PROCALCITON, LATICACIDVEN in the last 168 hours.  Recent Results (from the past 240 hour(s))  SARS Coronavirus 2 by RT PCR (hospital order, performed in [facility] hospital lab) Nasopharyngeal Nasopharyngeal Swab     Status: None   Collection Time: 05/17/20 11:30 PM   Specimen: Nasopharyngeal Swab  Result Value Ref Range Status   SARS Coronavirus 2 NEGATIVE NEGATIVE Final    Comment: (NOTE) SARS-CoV-2 target nucleic acids are NOT DETECTED. The SARS-CoV-2 RNA is generally detectable in upper and lower respiratory specimens during the acute phase of infection. The lowest concentration of SARS-CoV-2 viral copies this assay can detect is 250 copies / mL. A negative result does not preclude SARS-CoV-2 infection and should not be used as the sole basis for  treatment or other patient management decisions.  A negative result may occur with improper specimen collection / handling, submission of specimen other than nasopharyngeal swab, presence of viral mutation(s) within the areas targeted by this assay, and inadequate number of viral copies (<250 copies / mL). A negative result must be combined with clinical observations, patient history, and epidemiological information. Fact Sheet for Patients:   StrictlyIdeas.no Fact Sheet for Healthcare Providers: BankingDealers.co.za This test is not yet approved or cleared  by the [place] FDA and has been authorized for detection and/or diagnosis of SARS-CoV-2 by FDA under an Emergency Use Authorization (EUA).  This EUA will remain in effect (meaning this test can be used) for the duration of the COVID-19 declaration under Section 564(b)(1) of the Act, 21 U.S.C. section 360bbb-3(b)(1), unless the authorization is terminated or revoked sooner. Performed at [facility], [address], [place], [place] [address]          Radiology Studies: No results found.      Scheduled Meds: . aspirin EC  81 mg Oral Daily  . atorvastatin  80 mg Oral q1800  . carvedilol  6.25 mg Oral BID WC  . Chlorhexidine Gluconate Cloth  6 each Topical Daily  . clopidogrel  75 mg Oral Daily  . enoxaparin (LOVENOX) injection  75 mg Subcutaneous Q24H  . insulin aspart  18 Units Subcutaneous TID WC  . insulin glargine  60 Units Subcutaneous Daily  . levothyroxine  200 mcg Oral QAC breakfast  . nystatin   Topical TID  . potassium chloride  10 mEq Oral Daily  . sacubitril-valsartan  1 tablet Oral BID  . sodium chloride flush  10-40 mL Intracatheter Q12H  . sodium chloride flush  3 mL Intravenous Q12H  . timolol  1 drop Both Eyes BID   Continuous Infusions: . sodium chloride       LOS: 4 days    Time spent: 67mins    Kathie Dike, MD [facility]   If 7PM-7AM, please contact night-coverage [email or web address]  05/22/2020, 8:17 PM

## 2020-05-23 DIAGNOSIS — M79604 Pain in right leg: Secondary | ICD-10-CM

## 2020-05-23 DIAGNOSIS — M79605 Pain in left leg: Secondary | ICD-10-CM

## 2020-05-23 LAB — MAGNESIUM: Magnesium: 2.3 mg/dL (ref 1.7–2.4)

## 2020-05-23 LAB — BASIC METABOLIC PANEL
Anion gap: 9 (ref 5–15)
BUN: 64 mg/dL — ABNORMAL HIGH (ref 8–23)
CO2: 31 mmol/L (ref 22–32)
Calcium: 8.4 mg/dL — ABNORMAL LOW (ref 8.9–10.3)
Chloride: 93 mmol/L — ABNORMAL LOW (ref 98–111)
Creatinine, Ser: 1.77 mg/dL — ABNORMAL HIGH (ref 0.44–1.00)
GFR calc Af Amer: 35 mL/min — ABNORMAL LOW (ref >60)
GFR calc non Af Amer: 30 mL/min — ABNORMAL LOW (ref >60)
Glucose, Bld: 110 mg/dL — ABNORMAL HIGH (ref 70–99)
Potassium: 3.9 mmol/L (ref 3.5–5.1)
Sodium: 133 mmol/L — ABNORMAL LOW (ref 135–145)

## 2020-05-23 LAB — GLUCOSE, CAPILLARY
Glucose-Capillary: 111 mg/dL — ABNORMAL HIGH (ref 70–99)
Glucose-Capillary: 126 mg/dL — ABNORMAL HIGH (ref 70–99)
Glucose-Capillary: 100 mg/dL — ABNORMAL HIGH (ref 70–99)
Glucose-Capillary: 127 mg/dL — ABNORMAL HIGH (ref 70–99)

## 2020-05-23 LAB — PHOSPHORUS: Phosphorus: 5 mg/dL — ABNORMAL HIGH (ref 2.5–4.6)

## 2020-05-23 MED ORDER — LORAZEPAM 2 MG/ML IJ SOLN
1.0000 mg | Freq: Once | INTRAMUSCULAR | Status: AC
  Administered 2020-05-23: 1 mg via INTRAVENOUS
  Filled 2020-05-23: qty 1

## 2020-05-23 NOTE — TOC Progression Note (Signed)
Transition of Care [facility]) - Progression Note    Patient Details  Name: Maureen Jackson MRN: [id number] Date of Birth: [birth year]  Transition of Care [facility]) CM/SW Contact  Shade Flood, LCSW Phone Number: 05/23/2020, 2:55 PM  Clinical Narrative:     TOC following. Review of pt's record shows PT recommendation for [facility] at dc. Pt would also benefit from [facility] RN due to CHF. Spoke with pt by phone and she is agreeable to [facility]. Discussed [place] provider options with pt and she is agreeable to any agency that will accept her insurance. Referred to [facility] at [facility].   DC timeframe not yet known. TOC will follow.   Expected Discharge Plan: [place] Barriers to Discharge: Continued Medical Work up  Expected Discharge Plan and Services Expected Discharge Plan: [place] In-house Referral: Clinical Social Work   Post Acute Care Choice: [place] arrangements for the past 2 months: Apartment                           HH Arranged: PT, RN [place] Agency: [place] ([facility]) Date [place]: 05/23/20 Time [place]: [id number] Representative spoke with at [place]: [place] ([place]) Interventions    Readmission Risk Interventions Readmission Risk Prevention Plan 05/19/2020 09/18/2019  Transportation Screening Complete Complete  PCP or Specialist Appt within 3-5 Days Not Complete -  HRI or [employer] Complete Complete  Social Work Consult for [place] Planning/Counseling Complete Complete  Palliative Care Screening Not Applicable Not Applicable  Medication Review [employer]) Complete Complete  Some recent data might be hidden

## 2020-05-23 NOTE — Progress Notes (Signed)
TRH night shift.  The patient has been unable to sleep due to pain and muscle spasms in her lower extremities despite using methocarbamol with tramadol. She has requested a different treatment  Option so she is able to sleep. Lorazepam 1 mg IVP ordered given her 156.1 Kg weight.  Tennis Must, MD

## 2020-05-23 NOTE — Progress Notes (Signed)
PROGRESS NOTE    Maureen Jackson  JTT:[id number] DOB: 10/27/1956 DOA: 05/17/2020 PCP: Neale Burly, MD    Brief Narrative:  Maureen Jackson is a 64 y.o. female with medical history significant of chronic systolic HF with last LVEF of 35% in 2020, CAD, HTN, morbid obesity. She reports increased leg swelling for several weeks with pain on the right. She did have an outpatient LE venous doppler 04/30/20 negative for DVT. For several days she has had increasing dyspnea on exertion. Entresto and spironolactone were preveiously stopped 2/2 rise in creatinine but her cardiologist,Dr. Harl Bowie [facility], has restarted these medications. She does not c/o chest pain. Because of these progressive symptoms she presents to [facility] for evaluation.    Assessment & Plan:   Active Problems:   Hypothyroidism   Essential hypertension   GERD   Diabetes mellitus type 2 with complications, uncontrolled ([place])   Acute combined systolic (congestive) and diastolic (congestive) heart failure (HCC)   Hyperlipidemia   Acute on chronic combined systolic (congestive) and diastolic (congestive) heart failure ([place])   1. Acute on Chronic combined CHF.  Ejection fraction 30%.  Continues to have evidence of volume overload.  She is on intravenous Lasix.  Cardiology following.  Started on metolazone 6/10 and had good urine output.  Since creatinine is trending up, will hold Entresto and diuretics for today. 2. Hypertension.  Blood pressure currently stable.  Continue on Coreg 3. Diabetes mellitus, type II, uncontrolled with hyperglycemia.  Oral medication include Jardiance.  Chronically on Tresiba as well as NovoLog with meals.  We will continue on basal and bolus insulin.  4. Hyperlipidemia.  Continue on statin 5. Hypothyroidism. continue Synthroid. 6. CAD.  No complaints of chest pain.  Continue antiplatelet agents with aspirin and Plavix. 7. Right knee pain.  Venous Dopplers negative for DVT.  X-ray of right knee  without any acute process.  Does not report any trauma.  Feels the pain medications do help the pain, but when the medication wears off then the pain returns.  She is having difficulty ambulating as well as sitting still because of the pain.  ABIs have been ordered. 8. AKI chronic kidney disease stage IIIa.  Baseline creatinine around 1.0.  Creatinine is trending up because of diuretics.  Hold diuretics for today.    DVT prophylaxis: Lovenox Code Status: Full code Family Communication: Discussed with daughter over the phone 6/10 Disposition Plan: Status is: Inpatient  Remains inpatient appropriate because:IV treatments appropriate due to intensity of illness or inability to take PO.  Patient has ongoing pain in her right leg needing further work-up.   Dispo: The patient is from: Home              Anticipated d/c is to: Home              Anticipated d/c date is: 2 days              Patient currently is not medically stable to d/c.   Consultants:   Cardiology  Procedures:   Echo: 1. Images are limited. Suggest Definity contrast when adequate IV access  available.   2. Left ventricular ejection fraction, by estimation, is approximately  30%. The left ventricle has severely decreased function. Left ventricular  endocardial border not optimally defined to evaluate regional wall motion.  There is moderate left  ventricular hypertrophy. Left ventricular diastolic parameters are  indeterminate.   3. Right ventricular systolic function is normal. The right ventricular  size is  normal.   4. Moderate pericardial effusion. The pericardial effusion is  circumferential.   5. The mitral valve is grossly normal. Trivial mitral valve  regurgitation.   6. The aortic valve is tricuspid. Aortic valve regurgitation is not  visualized.   7. The inferior vena cava is normal in size with greater than 50%  respiratory variability, suggesting right atrial pressure of 3 mmHg.   Antimicrobials:         Subjective: Says she did not sleep well due to ongoing pain in her right leg.  This is preventing her from ambulating.  Objective: Vitals:   05/22/20 1639 05/22/20 2123 05/23/20 0414 05/23/20 0457  BP: 113/77 94/70 103/73   Pulse: 70 71 71   Resp: 17 17 20    Temp: 98 F (36.7 C) 98.4 F (36.9 C) 97.8 F (36.6 C)   TempSrc: Oral Oral Oral   SpO2: 96% 100% 97%   Weight:    (!) 152.6 kg  Height:        Intake/Output Summary (Last 24 hours) at 05/23/2020 2024 Last data filed at 05/23/2020 1700 Gross per 24 hour  Intake 1200 ml  Output 1000 ml  Net 200 ml   Filed Weights   05/21/20 0437 05/22/20 0600 05/23/20 0457  Weight: (!) 156.1 kg (!) 156.1 kg (!) 152.6 kg    Examination:  General exam: Alert, awake, oriented x 3 Respiratory system: Clear to auscultation. Respiratory effort normal. Cardiovascular system:RRR. No murmurs, rubs, gallops. Gastrointestinal system: Abdomen is nondistended, soft and nontender. No organomegaly or masses felt. Normal bowel sounds heard. Central nervous system: Alert and oriented. No focal neurological deficits. Extremities: extremities are warm with good cap refill. Pulses difficult to palpate due to edema Skin: No rashes, lesions or ulcers Psychiatry: Judgement and insight appear normal. Mood & affect appropriate.        Data Reviewed: I have personally reviewed following labs and imaging studies  CBC: Recent Labs  Lab 05/17/20 2107  WBC 7.4  HGB 12.8  HCT 40.8  MCV 92.1  PLT 962   Basic Metabolic Panel: Recent Labs  Lab 05/17/20 2107 05/18/20 0015 05/19/20 0537 05/20/20 0426 05/21/20 0805 05/22/20 0657 05/23/20 0822 05/23/20 1131  NA   < >  --  140 139 137 138 133*  --   K   < >  --  4.1 3.9 3.9 3.7 3.9  --   CL   < >  --  101 100 95* 95* 93*  --   CO2   < >  --  29 30 30 30 31   --   GLUCOSE   < >  --  145* 133* 137* 112* 110*  --   BUN   < >  --  30* 36* 45* 54* 64*  --   CREATININE   < >  --  1.33* 1.49* 1.68*  1.93* 1.77*  --   CALCIUM   < >  --  8.5* 8.4* 8.5* 8.5* 8.4*  --   MG  --  2.3 2.0  --   --   --  2.3  --   PHOS  --   --   --   --   --   --   --  5.0*   < > = values in this interval not displayed.   GFR: Estimated Creatinine Clearance: 49.5 mL/min (A) (by C-G formula based on SCr of 1.77 mg/dL (H)). Liver Function Tests: No results for input(s): AST, ALT, ALKPHOS, BILITOT,  PROT, ALBUMIN in the last 168 hours. No results for input(s): LIPASE, AMYLASE in the last 168 hours. No results for input(s): AMMONIA in the last 168 hours. Coagulation Profile: No results for input(s): INR, PROTIME in the last 168 hours. Cardiac Enzymes: No results for input(s): CKTOTAL, CKMB, CKMBINDEX, TROPONINI in the last 168 hours. BNP (last 3 results) No results for input(s): PROBNP in the last 8760 hours. HbA1C: No results for input(s): HGBA1C in the last 72 hours. CBG: Recent Labs  Lab 05/22/20 1651 05/22/20 2124 05/23/20 0735 05/23/20 1116 05/23/20 1645  GLUCAP 81 98 111* 126* 100*   Lipid Profile: No results for input(s): CHOL, HDL, LDLCALC, TRIG, CHOLHDL, LDLDIRECT in the last 72 hours. Thyroid Function Tests: No results for input(s): TSH, T4TOTAL, FREET4, T3FREE, THYROIDAB in the last 72 hours. Anemia Panel: No results for input(s): VITAMINB12, FOLATE, FERRITIN, TIBC, IRON, RETICCTPCT in the last 72 hours. Sepsis Labs: No results for input(s): PROCALCITON, LATICACIDVEN in the last 168 hours.  Recent Results (from the past 240 hour(s))  SARS Coronavirus 2 by RT PCR (hospital order, performed in [facility] hospital lab) Nasopharyngeal Nasopharyngeal Swab     Status: None   Collection Time: 05/17/20 11:30 PM   Specimen: Nasopharyngeal Swab  Result Value Ref Range Status   SARS Coronavirus 2 NEGATIVE NEGATIVE Final    Comment: (NOTE) SARS-CoV-2 target nucleic acids are NOT DETECTED. The SARS-CoV-2 RNA is generally detectable in upper and lower respiratory specimens during the acute  phase of infection. The lowest concentration of SARS-CoV-2 viral copies this assay can detect is 250 copies / mL. A negative result does not preclude SARS-CoV-2 infection and should not be used as the sole basis for treatment or other patient management decisions.  A negative result may occur with improper specimen collection / handling, submission of specimen other than nasopharyngeal swab, presence of viral mutation(s) within the areas targeted by this assay, and inadequate number of viral copies (<250 copies / mL). A negative result must be combined with clinical observations, patient history, and epidemiological information. Fact Sheet for Patients:   StrictlyIdeas.no Fact Sheet for Healthcare Providers: BankingDealers.co.za This test is not yet approved or cleared  by the [place] FDA and has been authorized for detection and/or diagnosis of SARS-CoV-2 by FDA under an Emergency Use Authorization (EUA).  This EUA will remain in effect (meaning this test can be used) for the duration of the COVID-19 declaration under Section 564(b)(1) of the Act, 21 U.S.C. section 360bbb-3(b)(1), unless the authorization is terminated or revoked sooner. Performed at [facility], [address], [place], [place] [address]          Radiology Studies: No results found.      Scheduled Meds: . aspirin EC  81 mg Oral Daily  . atorvastatin  80 mg Oral q1800  . carvedilol  6.25 mg Oral BID WC  . Chlorhexidine Gluconate Cloth  6 each Topical Daily  . clopidogrel  75 mg Oral Daily  . enoxaparin (LOVENOX) injection  75 mg Subcutaneous Q24H  . insulin aspart  18 Units Subcutaneous TID WC  . insulin glargine  60 Units Subcutaneous Daily  . levothyroxine  200 mcg Oral QAC breakfast  . nystatin   Topical TID  . potassium chloride  10 mEq Oral Daily  . sacubitril-valsartan  1 tablet Oral BID  . sodium chloride flush  10-40 mL Intracatheter Q12H    . sodium chloride flush  3 mL Intravenous Q12H  . timolol  1 drop  Both Eyes BID   Continuous Infusions: . sodium chloride       LOS: 5 days    Time spent: 34mins    Kathie Dike, MD [facility]   If 7PM-7AM, please contact night-coverage [email or web address]  05/23/2020, 8:24 PM

## 2020-05-24 ENCOUNTER — Inpatient Hospital Stay (HOSPITAL_COMMUNITY): Payer: Medicare HMO

## 2020-05-24 DIAGNOSIS — E038 Other specified hypothyroidism: Secondary | ICD-10-CM

## 2020-05-24 DIAGNOSIS — E063 Autoimmune thyroiditis: Secondary | ICD-10-CM

## 2020-05-24 LAB — GLUCOSE, CAPILLARY
Glucose-Capillary: 112 mg/dL — ABNORMAL HIGH (ref 70–99)
Glucose-Capillary: 85 mg/dL (ref 70–99)
Glucose-Capillary: 159 mg/dL — ABNORMAL HIGH (ref 70–99)
Glucose-Capillary: 149 mg/dL — ABNORMAL HIGH (ref 70–99)

## 2020-05-24 LAB — BASIC METABOLIC PANEL
Anion gap: 12 (ref 5–15)
BUN: 74 mg/dL — ABNORMAL HIGH (ref 8–23)
CO2: 29 mmol/L (ref 22–32)
Calcium: 8.1 mg/dL — ABNORMAL LOW (ref 8.9–10.3)
Chloride: 92 mmol/L — ABNORMAL LOW (ref 98–111)
Creatinine, Ser: 2.1 mg/dL — ABNORMAL HIGH (ref 0.44–1.00)
GFR calc Af Amer: 29 mL/min — ABNORMAL LOW (ref >60)
GFR calc non Af Amer: 25 mL/min — ABNORMAL LOW (ref >60)
Glucose, Bld: 117 mg/dL — ABNORMAL HIGH (ref 70–99)
Potassium: 4.2 mmol/L (ref 3.5–5.1)
Sodium: 133 mmol/L — ABNORMAL LOW (ref 135–145)

## 2020-05-24 LAB — CBC
HCT: 33 % — ABNORMAL LOW (ref 36.0–46.0)
Hemoglobin: 10.4 g/dL — ABNORMAL LOW (ref 12.0–15.0)
MCH: 28 pg (ref 26.0–34.0)
MCHC: 31.5 g/dL (ref 30.0–36.0)
MCV: 88.9 fL (ref 80.0–100.0)
Platelets: 196 10*3/uL (ref 150–400)
RBC: 3.71 MIL/uL — ABNORMAL LOW (ref 3.87–5.11)
RDW: 13.3 % (ref 11.5–15.5)
WBC: 8.1 10*3/uL (ref 4.0–10.5)
nRBC: 0 % (ref 0.0–0.2)

## 2020-05-24 MED ORDER — POLYETHYLENE GLYCOL 3350 17 G PO PACK
17.0000 g | PACK | Freq: Once | ORAL | Status: AC
  Administered 2020-05-24: 17 g via ORAL
  Filled 2020-05-24: qty 1

## 2020-05-24 MED ORDER — SENNOSIDES-DOCUSATE SODIUM 8.6-50 MG PO TABS
2.0000 | ORAL_TABLET | Freq: Two times a day (BID) | ORAL | Status: DC
  Administered 2020-05-24 – 2020-05-25 (×3): 2 via ORAL
  Filled 2020-05-24 (×4): qty 2

## 2020-05-24 NOTE — Care Management Important Message (Signed)
Important Message  Patient Details  Name: Maureen Jackson MRN: [id number] Date of Birth: 1956-04-09   Medicare Important Message Given:  Yes     Tommy Medal 05/24/2020, 1:53 PM

## 2020-05-24 NOTE — Progress Notes (Signed)
PROGRESS NOTE    Maureen Jackson  VHQ:[id number] DOB: [birth year] DOA: 05/17/2020 PCP: Neale Burly, MD    Brief Narrative:  -64 y.o. female with past medical history of CAD (s/p DES to LAD and RCA in 09/2017), HFrEF, ischemic cardiomyopathy(EF 30-35% in 09/2017, at 35-40% by repeat in 12/2018), HTN, HLD, Type 2 DM, Stage 4 CKDand morbid obesity who admitted on 05/18/20 with worsening dyspnea, increased lower extremity swelling and acute on chronic systolic dysfunction CHF exacerbation  -  She did have an outpatient LE venous doppler 04/30/20 and repeat on 05/18/20 negative for DVT.  Marland Kitchen Entresto and spironolactone were preveiously stopped 2/2 rise in creatinine but her cardiologist,Dr. Harl Bowie [facility], has restarted these medications.   Repeat Echo this admission with EF of 30%   Assessment & Plan:   Active Problems:   Hypothyroidism   Essential hypertension   GERD   Diabetes mellitus type 2 with complications, uncontrolled ([place])   Acute combined systolic (congestive) and diastolic (congestive) heart failure (HCC)   Hyperlipidemia   Acute on chronic combined systolic (congestive) and diastolic (congestive) heart failure (HCC)    1)HFrEF/Acute on Chronic combined diastolic and systolic dysfunction CHF---.  Ejection fraction 30%.  Continues to have evidence of volume overload.  Diuretics (Lasix and Aldactone)  and entresto on hold due to worsening renal function -Cardiology input appreciated -weight is up to 340.8 pounds from 336.4 pounds 24hrs  Ago -Fatigue and dyspnea on exertion persist  2)CAD status post DES to the LAD and RCA in 2018.  ECG with old left bundle branch block and negative high-sensitivity troponin I levels. -Remains chest pain-free, continue Coreg, aspirin, Plavix and Lipitor  3)AKI----acute kidney injury on CKD stage -IIIa -Worsening renal function is due to diuretics and Entresto use =-   creatinine on admission=1.21  , baseline creatinine = 1.0 to 1.2    ,  creatinine is now= 2.1 (new peak) , renally adjust medications, avoid nephrotoxic agents / dehydration  / hypotension -Continue to hold Lasix, Aldactone and Entresto   4)Severe Hypothyroidism-TSH was 85 on 04/14/2020  -Currently on levothyroxine 200 mcg daily -Recheck TSH, Free T3 and T4 and patient will need follow-up with endocrinologist post discharge -This is probably contributing to her fatigue  5)DM2-A1c was 8.95 months ago reflecting uncontrolled DM PTA -Jardiance on hold, -Continue Lantus insulin 60 units daily along with Novolog sliding scale coverage  6)HTN-stable, continue Coreg , diuretics and Entresto on hold as above in #1  7)Bil LE edema nd Rt Knee Pain--  LE venous doppler 04/30/20 and repeat on 05/18/20 negative for DVT -No trauma, ABIs ordered by Dr. Roderic Palau pending     DVT prophylaxis: Lovenox Code Status: Full code Family Communication: Discussed with daughter  Disposition Plan: Status is: Inpatient  Remains inpatient appropriate because:Persistent severe electrolyte disturbances, Inpatient level of care appropriate due to severity of illness and Worsening renal function in the setting of volume overload and CHF/persistent dyspnea on exertion and fatigue, requiring adjustments of diuretics.   Dispo: The patient is from: Home              Anticipated d/c is to: Home              Anticipated d/c date is: 2 days              Patient currently is not medically stable to d/c. - Consultants:   Cardiology  Procedures:   Echo: 1. Images are limited. Suggest Definity contrast when adequate IV access  available.   2. Left ventricular ejection fraction, by estimation, is approximately  30%. The left ventricle has severely decreased function. Left ventricular  endocardial border not optimally defined to evaluate regional wall motion.  There is moderate left  ventricular hypertrophy. Left ventricular diastolic parameters are  indeterminate.   3. Right ventricular  systolic function is normal. The right ventricular  size is normal.   4. Moderate pericardial effusion. The pericardial effusion is  circumferential.   5. The mitral valve is grossly normal. Trivial mitral valve  regurgitation.   6. The aortic valve is tricuspid. Aortic valve regurgitation is not  visualized.   7. The inferior vena cava is normal in size with greater than 50%  respiratory variability, suggesting right atrial pressure of 3 mmHg.   Antimicrobials:       Subjective: -Right knee pain persist -Dyspnea on exertion and lower extremity edema persist -Patient complains of excessive fatigue -Chest pains  Objective: Vitals:   05/23/20 2029 05/23/20 2124 05/24/20 0429 05/24/20 0444  BP:  101/69 121/74   Pulse:  70 79   Resp:  20 20   Temp:  98.1 F (36.7 C) (!) 97.5 F (36.4 C)   TempSrc:  Oral Oral   SpO2: 92% 96% 95%   Weight:    (!) 154.6 kg  Height:        Intake/Output Summary (Last 24 hours) at 05/24/2020 1304 Last data filed at 05/24/2020 1200 Gross per 24 hour  Intake 480 ml  Output 1900 ml  Net -1420 ml   Filed Weights   05/22/20 0600 05/23/20 0457 05/24/20 0444  Weight: (!) 156.1 kg (!) 152.6 kg (!) 154.6 kg    Examination:  General exam: Alert, awake, oriented x 3, obese Respiratory system: Diminished without wheezing cardiovascular system:RRR. No murmurs, rubs, gallops. Gastrointestinal system: Abdomen is nondistended, soft and nontender.  Increased truncal adiposity normal bowel sounds heard. Central nervous system: Alert and oriented.  Generalized weakness and fatigue, no focal neurological deficits.  Extremities: 1 to  2+ pitting edema bilaterally  skin: No rashes, lesions or ulcers Psychiatry: Judgement and insight appear normal. Mood & affect appropriate.    Data Reviewed:   CBC: Recent Labs  Lab 05/17/20 2107 05/24/20 0516  WBC 7.4 8.1  HGB 12.8 10.4*  HCT 40.8 33.0*  MCV 92.1 88.9  PLT 237 762   Basic Metabolic  Panel: Recent Labs  Lab 05/17/20 2107 05/18/20 0015 05/19/20 0537 05/19/20 0537 05/20/20 0426 05/21/20 0805 05/22/20 0657 05/23/20 0822 05/23/20 1131 05/24/20 0516  NA   < >  --  140   < > 139 137 138 133*  --  133*  K   < >  --  4.1   < > 3.9 3.9 3.7 3.9  --  4.2  CL   < >  --  101   < > 100 95* 95* 93*  --  92*  CO2   < >  --  29   < > 30 30 30 31   --  29  GLUCOSE   < >  --  145*   < > 133* 137* 112* 110*  --  117*  BUN   < >  --  30*   < > 36* 45* 54* 64*  --  74*  CREATININE   < >  --  1.33*   < > 1.49* 1.68* 1.93* 1.77*  --  2.10*  CALCIUM   < >  --  8.5*   < >  8.4* 8.5* 8.5* 8.4*  --  8.1*  MG  --  2.3 2.0  --   --   --   --  2.3  --   --   PHOS  --   --   --   --   --   --   --   --  5.0*  --    < > = values in this interval not displayed.   GFR: Estimated Creatinine Clearance: 42.1 mL/min (A) (by C-G formula based on SCr of 2.1 mg/dL (H)). Liver Function Tests: No results for input(s): AST, ALT, ALKPHOS, BILITOT, PROT, ALBUMIN in the last 168 hours. No results for input(s): LIPASE, AMYLASE in the last 168 hours. No results for input(s): AMMONIA in the last 168 hours. Coagulation Profile: No results for input(s): INR, PROTIME in the last 168 hours. Cardiac Enzymes: No results for input(s): CKTOTAL, CKMB, CKMBINDEX, TROPONINI in the last 168 hours. BNP (last 3 results) No results for input(s): PROBNP in the last 8760 hours. HbA1C: No results for input(s): HGBA1C in the last 72 hours. CBG: Recent Labs  Lab 05/23/20 1116 05/23/20 1645 05/23/20 2124 05/24/20 0733 05/24/20 1117  GLUCAP 126* 100* 127* 112* 85   Lipid Profile: No results for input(s): CHOL, HDL, LDLCALC, TRIG, CHOLHDL, LDLDIRECT in the last 72 hours. Thyroid Function Tests: No results for input(s): TSH, T4TOTAL, FREET4, T3FREE, THYROIDAB in the last 72 hours. Anemia Panel: No results for input(s): VITAMINB12, FOLATE, FERRITIN, TIBC, IRON, RETICCTPCT in the last 72 hours. Sepsis Labs: No results  for input(s): PROCALCITON, LATICACIDVEN in the last 168 hours.  Recent Results (from the past 240 hour(s))  SARS Coronavirus 2 by RT PCR (hospital order, performed in [facility] hospital lab) Nasopharyngeal Nasopharyngeal Swab     Status: None   Collection Time: 05/17/20 11:30 PM   Specimen: Nasopharyngeal Swab  Result Value Ref Range Status   SARS Coronavirus 2 NEGATIVE NEGATIVE Final    Comment: (NOTE) SARS-CoV-2 target nucleic acids are NOT DETECTED. The SARS-CoV-2 RNA is generally detectable in upper and lower respiratory specimens during the acute phase of infection. The lowest concentration of SARS-CoV-2 viral copies this assay can detect is 250 copies / mL. A negative result does not preclude SARS-CoV-2 infection and should not be used as the sole basis for treatment or other patient management decisions.  A negative result may occur with improper specimen collection / handling, submission of specimen other than nasopharyngeal swab, presence of viral mutation(s) within the areas targeted by this assay, and inadequate number of viral copies (<250 copies / mL). A negative result must be combined with clinical observations, patient history, and epidemiological information. Fact Sheet for Patients:   StrictlyIdeas.no Fact Sheet for Healthcare Providers: BankingDealers.co.za This test is not yet approved or cleared  by the [place] FDA and has been authorized for detection and/or diagnosis of SARS-CoV-2 by FDA under an Emergency Use Authorization (EUA).  This EUA will remain in effect (meaning this test can be used) for the duration of the COVID-19 declaration under Section 564(b)(1) of the Act, 21 U.S.C. section 360bbb-3(b)(1), unless the authorization is terminated or revoked sooner. Performed at [facility], [address] [place], [place] [address]       Radiology Studies: No results found.  Scheduled Meds: . aspirin  EC  81 mg Oral Daily  . atorvastatin  80 mg Oral q1800  . carvedilol  6.25 mg Oral BID WC  . Chlorhexidine Gluconate Cloth  6 each Topical  Daily  . clopidogrel  75 mg Oral Daily  . enoxaparin (LOVENOX) injection  75 mg Subcutaneous Q24H  . insulin aspart  18 Units Subcutaneous TID WC  . insulin glargine  60 Units Subcutaneous Daily  . levothyroxine  200 mcg Oral QAC breakfast  . nystatin   Topical TID  . sodium chloride flush  10-40 mL Intracatheter Q12H  . sodium chloride flush  3 mL Intravenous Q12H  . timolol  1 drop Both Eyes BID   Continuous Infusions: . sodium chloride       LOS: 6 days    Roxan Hockey, MD [facility]   If 7PM-7AM, please contact night-coverage [email or web address]  05/24/2020, 1:04 PM

## 2020-05-24 NOTE — Progress Notes (Addendum)
Progress Note  Patient Name: MAURIANNA BENARD Date of Encounter: 05/24/2020  [facility] Cardiologist: Carlyle Dolly, MD    Subjective   Feeling a littler better today but complains of fatigue  Inpatient Medications    Scheduled Meds: . aspirin EC  81 mg Oral Daily  . atorvastatin  80 mg Oral q1800  . carvedilol  6.25 mg Oral BID WC  . Chlorhexidine Gluconate Cloth  6 each Topical Daily  . clopidogrel  75 mg Oral Daily  . enoxaparin (LOVENOX) injection  75 mg Subcutaneous Q24H  . insulin aspart  18 Units Subcutaneous TID WC  . insulin glargine  60 Units Subcutaneous Daily  . levothyroxine  200 mcg Oral QAC breakfast  . nystatin   Topical TID  . potassium chloride  10 mEq Oral Daily  . sacubitril-valsartan  1 tablet Oral BID  . sodium chloride flush  10-40 mL Intracatheter Q12H  . sodium chloride flush  3 mL Intravenous Q12H  . timolol  1 drop Both Eyes BID   Continuous Infusions: . sodium chloride     PRN Meds: sodium chloride, acetaminophen, albuterol, alum & mag hydroxide-simeth, methocarbamol, ondansetron (ZOFRAN) IV, ondansetron, sodium chloride flush, sodium chloride flush   Vital Signs    Vitals:   05/23/20 2029 05/23/20 2124 05/24/20 0429 05/24/20 0444  BP:  101/69 121/74   Pulse:  70 79   Resp:  20 20   Temp:  98.1 F (36.7 C) (!) 97.5 F (36.4 C)   TempSrc:  Oral Oral   SpO2: 92% 96% 95%   Weight:    (!) 154.6 kg  Height:        Intake/Output Summary (Last 24 hours) at 05/24/2020 0907 Last data filed at 05/23/2020 1700 Gross per 24 hour  Intake 960 ml  Output 1000 ml  Net -40 ml   Last 3 Weights 05/24/2020 05/23/2020 05/22/2020  Weight (lbs) 340 lb 13.3 oz 336 lb 6.8 oz 344 lb 2.2 oz  Weight (kg) 154.6 kg 152.6 kg 156.1 kg      Telemetry    NSR - Personally Reviewed  ECG       Physical Exam    GEN: No acute distress.   Neck: No JVD Cardiac: RRR, no murmurs, rubs, or gallops.  Respiratory: Clear to auscultation bilaterally. GI:  Soft, nontender, non-distended  MS: plus 1 edema; No deformity. Neuro:  Nonfocal  Psych: Normal affect   Labs    High Sensitivity Troponin:   Recent Labs  Lab 05/17/20 2107 05/18/20 0015  TROPONINIHS 9 8      Chemistry Recent Labs  Lab 05/22/20 0657 05/23/20 0822 05/24/20 0516  NA 138 133* 133*  K 3.7 3.9 4.2  CL 95* 93* 92*  CO2 30 31 29   GLUCOSE 112* 110* 117*  BUN 54* 64* 74*  CREATININE 1.93* 1.77* 2.10*  CALCIUM 8.5* 8.4* 8.1*  GFRNONAA 27* 30* 25*  GFRAA 32* 35* 29*  ANIONGAP 13 9 12      Hematology Recent Labs  Lab 05/17/20 2107 05/24/20 0516  WBC 7.4 8.1  RBC 4.43 3.71*  HGB 12.8 10.4*  HCT 40.8 33.0*  MCV 92.1 88.9  MCH 28.9 28.0  MCHC 31.4 31.5  RDW 13.6 13.3  PLT 237 196    BNP Recent Labs  Lab 05/17/20 2107  BNP 132.0*     DDimer No results for input(s): DDIMER in the last 168 hours.   Radiology    No results found.  Cardiac Studies  Echo 05/18/20 IMPRESSIONS     1. Images are limited. Suggest Definity contrast when adequate IV access  available.   2. Left ventricular ejection fraction, by estimation, is approximately  30%. The left ventricle has severely decreased function. Left ventricular  endocardial border not optimally defined to evaluate regional wall motion.  There is moderate left  ventricular hypertrophy. Left ventricular diastolic parameters are  indeterminate.   3. Right ventricular systolic function is normal. The right ventricular  size is normal.   4. Moderate pericardial effusion. The pericardial effusion is  circumferential.   5. The mitral valve is grossly normal. Trivial mitral valve  regurgitation.   6. The aortic valve is tricuspid. Aortic valve regurgitation is not  visualized.   7. The inferior vena cava is normal in size with greater than 50%  respiratory variability, suggesting right atrial pressure of 3 mmHg.     Echocardiogram: 12/2018 Study Conclusions   - Limited study to evaluate LV  function.  - Technically difficult study, poor visualization. Technical issues    with attempts at echo contrast injections, inadequate contrast    effect on imaging despite multiple doses.  - LVEF appears grossly in the 35-40% range.  Patient Profile     64 y.o. female with past medical history of CAD (s/p DES to LAD and RCA in 09/2017), ischemic cardiomyopathy (EF 30-35% in 09/2017, at 35-40% by repeat in 12/2018), HTN, HLD, Type 2 DM, Stage 4 CKD and obesity who presents with acute CHF.   Assessment & Plan     1.  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  Echocardiogram  indicated LVEF approximately 30% with very limited images,  zaroxyln added last week but Crt rising so entresto and diuretics held yesterday but entresto still on med list. Will stop K. Continue to hold diuretics today with rising Crt. I/O's negative 3.8 L since admission, positive 200 yest.  2.  CAD status post DES to the LAD and RCA in 2018.  She does not report any active angina at this time, ECG with old left bundle branch block and negative high-sensitivity troponin I levels.   3.  CKD stage II-III.  Creatinine 2.1 today (1.77 yesterday)  4.  Essential hypertension 100-115 range.     For questions or updates, please contact [place] Please consult [email or web address] for contact info under        Signed, Ermalinda Barrios, PA-C  05/24/2020, 9:07 AM    Pt seen and examined   I agree with findings as noted above by Gerrianne Scale.above  Currently  complains of fatigue    On exam, morbidly obese 64 yo in NAD Neck:  Full Lungs are CTA Cardiac RRR  No S3 Ext with 1+ edema     Acute on chronic systolic CHF   LVEF 47%  The pt hs diuresed 3.8 L   Cr has bumped to 2.1  Would hold for now  Reassess in AM     Strict I/O   No symptoms of angina  Reviewed chart  Pt says that has had a sleep eval in past   I cannot find    Not on CPAP   Would prob benefit from study after d/c.  Dorris Carnes MD

## 2020-05-25 LAB — CREATININE, SERUM
Creatinine, Ser: 1.63 mg/dL — ABNORMAL HIGH (ref 0.44–1.00)
GFR calc Af Amer: 39 mL/min — ABNORMAL LOW (ref >60)
GFR calc non Af Amer: 33 mL/min — ABNORMAL LOW (ref >60)

## 2020-05-25 LAB — GLUCOSE, CAPILLARY
Glucose-Capillary: 119 mg/dL — ABNORMAL HIGH (ref 70–99)
Glucose-Capillary: 101 mg/dL — ABNORMAL HIGH (ref 70–99)

## 2020-05-25 LAB — RENAL FUNCTION PANEL
Albumin: 3.2 g/dL — ABNORMAL LOW (ref 3.5–5.0)
Anion gap: 10 (ref 5–15)
BUN: 71 mg/dL — ABNORMAL HIGH (ref 8–23)
CO2: 30 mmol/L (ref 22–32)
Calcium: 8.5 mg/dL — ABNORMAL LOW (ref 8.9–10.3)
Chloride: 98 mmol/L (ref 98–111)
Creatinine, Ser: 1.58 mg/dL — ABNORMAL HIGH (ref 0.44–1.00)
GFR calc Af Amer: 40 mL/min — ABNORMAL LOW (ref >60)
GFR calc non Af Amer: 35 mL/min — ABNORMAL LOW (ref >60)
Glucose, Bld: 143 mg/dL — ABNORMAL HIGH (ref 70–99)
Phosphorus: 4 mg/dL (ref 2.5–4.6)
Potassium: 4.3 mmol/L (ref 3.5–5.1)
Sodium: 138 mmol/L (ref 135–145)

## 2020-05-25 LAB — T4, FREE: Free T4: 1.11 ng/dL (ref 0.61–1.12)

## 2020-05-25 LAB — TSH: TSH: 14.385 u[IU]/mL — ABNORMAL HIGH (ref 0.350–4.500)

## 2020-05-25 MED ORDER — VENTOLIN HFA 108 (90 BASE) MCG/ACT IN AERS: 1.0000 | INHALATION_SPRAY | Freq: Four times a day (QID) | RESPIRATORY_TRACT | 1 refills | Status: DC | PRN

## 2020-05-25 MED ORDER — LEVOTHYROXINE SODIUM 200 MCG PO TABS: 200.0000 ug | ORAL_TABLET | Freq: Every day | ORAL | 3 refills | Status: DC

## 2020-05-25 MED ORDER — FUROSEMIDE 40 MG PO TABS: 40.0000 mg | ORAL_TABLET | Freq: Two times a day (BID) | ORAL | Status: DC

## 2020-05-25 MED ORDER — CARVEDILOL 6.25 MG PO TABS: 6.2500 mg | ORAL_TABLET | Freq: Two times a day (BID) | ORAL | 3 refills | Status: DC

## 2020-05-25 MED ORDER — METHOCARBAMOL 500 MG PO TABS: 500.0000 mg | ORAL_TABLET | Freq: Three times a day (TID) | ORAL | 1 refills | Status: DC | PRN

## 2020-05-25 MED ORDER — NYSTATIN 100000 UNIT/GM EX POWD: Freq: Three times a day (TID) | CUTANEOUS | 0 refills | Status: DC

## 2020-05-25 MED ORDER — SENNOSIDES-DOCUSATE SODIUM 8.6-50 MG PO TABS: 2.0000 | ORAL_TABLET | Freq: Two times a day (BID) | ORAL | 3 refills | Status: AC

## 2020-05-25 MED ORDER — FUROSEMIDE 40 MG PO TABS: 40.0000 mg | ORAL_TABLET | Freq: Two times a day (BID) | ORAL | 1 refills | Status: DC

## 2020-05-25 MED ORDER — ASPIRIN EC 81 MG PO TBEC: 81.0000 mg | DELAYED_RELEASE_TABLET | Freq: Every day | ORAL | 11 refills | Status: DC

## 2020-05-25 MED ORDER — ATORVASTATIN CALCIUM 80 MG PO TABS: 80.0000 mg | ORAL_TABLET | Freq: Every day | ORAL | 2 refills | Status: DC

## 2020-05-25 MED ORDER — JARDIANCE 10 MG PO TABS: 10.0000 mg | ORAL_TABLET | Freq: Every day | ORAL | 3 refills | Status: DC

## 2020-05-25 MED ORDER — ACETAMINOPHEN 325 MG PO TABS: 650.0000 mg | ORAL_TABLET | ORAL | 0 refills | Status: AC | PRN

## 2020-05-25 MED ORDER — ENTRESTO 24-26 MG PO TABS: 1.0000 | ORAL_TABLET | Freq: Two times a day (BID) | ORAL | 0 refills | Status: DC

## 2020-05-25 NOTE — Progress Notes (Signed)
PICC line removed per order. Removed with patient bearing down, immediately covered with sterile Vaseline gauze and 4x4 sterile dry dressing. Direct pressure held for 3 minutes and pressure dressing applied. Advised patient to remain lying for 30 minutes post removal to aid in hemostasis of insertion site. Patient advised to leave dressing in place for 24 hours. Patient gave verbal understanding of instruction.

## 2020-05-25 NOTE — Progress Notes (Signed)
PT Cancellation Note  Patient Details Name: Maureen Jackson MRN: [id number] DOB: Feb 01, 1956   Cancelled Treatment:    Reason Eval/Treat Not Completed: Patient declined, no reason specified.  Patient presents seated at bedside, declined therapy due to c/o constipation, and states she has been walking to bathroom.   11:29 AM, 05/25/20 Lonell Grandchild, MPT Physical Therapist with [facility] [address] [phone] office [phone] mobile phone

## 2020-05-25 NOTE — Discharge Summary (Addendum)
RAMANI RIVA, is a 64 y.o. female  DOB 22-Jun-1956  MRN [id number].  Admission date:  05/17/2020  Admitting Physician  Neena Rhymes, MD  Discharge Date:  05/25/2020   Primary MD  Neale Burly, MD  Recommendations for primary care physician for things to follow:   1)Avoid ibuprofen/Advil/Aleve/Motrin/Goody Powders/Naproxen/BC powders/Meloxicam/Diclofenac/Indomethacin and other Nonsteroidal anti-inflammatory medications as these will make you more likely to bleed and can cause stomach ulcers, can also cause Kidney problems.   2)Restart Entresto on 05/27/2020 at a much lower dose of 24 --26 mg tablets 1 twice a day  3)Very low-salt diet advised 4)Weigh yourself daily, call if you gain more than 3 pounds in 1 day or more than 5 pounds in 1 week as your diuretic medications may need to be adjusted 5)Limit your Fluid  intake to no more than 60 ounces (1.8 Liters) per day  Admission Diagnosis  Acute pulmonary edema (HCC) [J81.0] SOB (shortness of breath) [R06.02] Right leg pain [M79.604] Acute on chronic combined systolic (congestive) and diastolic (congestive) heart failure (HCC) [I50.43]   Discharge Diagnosis  Acute pulmonary edema (HCC) [J81.0] SOB (shortness of breath) [R06.02] Right leg pain [M79.604] Acute on chronic combined systolic (congestive) and diastolic (congestive) heart failure (HCC) [I50.43]    Active Problems:   Hypothyroidism   Essential hypertension   GERD   Diabetes mellitus type 2 with complications, uncontrolled ([place])   Acute combined systolic (congestive) and diastolic (congestive) heart failure (HCC)   Hyperlipidemia   Acute on chronic combined systolic (congestive) and diastolic (congestive) heart failure ([place])      Past Medical History:  Diagnosis Date  . 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 Bleeding from mouth    when she brushed teeth or ate  . Chronic systolic CHF  (congestive heart failure) ([place])   . Coronary artery disease 09/2017   a. multivessel CAD by cath in 09/2017 and not felt to be a CABG candidate --> underwent two-vessel PCI with DES to the LAD and DES to the RCA  . Diabetes mellitus   . Dyspnea   . Glaucoma   . Hypertension   . Left bundle branch block   . Morbid obesity ([place])   . Obstructive sleep apnea    does not wear CPAP  . Thyroid disease     Past Surgical History:  Procedure Laterality Date  . ABDOMINAL HYSTERECTOMY    . CARDIAC CATHETERIZATION  09/12/2017  . CORONARY STENT INTERVENTION  09/12/2017   STENT RESOLUTE ONYX 4.09W11 drug eluting stent was successfully placed  . CORONARY STENT INTERVENTION N/A 09/12/2017   Procedure: CORONARY STENT INTERVENTION;  Surgeon: Leonie Man, MD;  Location: [facility];  Service: Cardiovascular;  Laterality: N/A;  . INCISION AND DRAINAGE PERIRECTAL ABSCESS Left 09/16/2019   Procedure: IRRIGATION AND DEBRIDEMENT LABIA ABSCESS;  Surgeon: Coralie Keens, MD;  Location: [facility];  Service: General;  Laterality: Left;  . IR FLUORO GUIDE CV LINE RIGHT  09/16/2019  . IR US GUIDE  VASC ACCESS RIGHT  09/16/2019  . LEFT HEART CATH AND CORONARY ANGIOGRAPHY N/A 09/12/2017   Procedure: LEFT HEART CATH AND CORONARY ANGIOGRAPHY;  Surgeon: Leonie Man, MD;  Location: [facility];  Service: Cardiovascular;  Laterality: N/A;  . MASS EXCISION N/A 12/18/2018   Procedure: EXCISION TONGUE MASS;  Surgeon: Leta Baptist, MD;  Location: [facility];  Service: ENT;  Laterality: N/A;  . RIGHT/LEFT HEART CATH AND CORONARY ANGIOGRAPHY N/A 09/10/2017   Procedure: RIGHT/LEFT HEART CATH AND CORONARY ANGIOGRAPHY;  Surgeon: Troy Sine, MD;  Location: [facility];  Service: Cardiovascular;  Laterality: N/A;  . THYROID SURGERY         HPI  from the history and physical done on the day of admission:    Chief Complaint: legs are swollen, DOE  HPI: KORYNN KENEDY is a 64 y.o. female with medical  history significant of chronic systolic HF with last LVEF of 35% in 2020, CAD, HTN, morbid obesity. She reports increased leg swelling for several weeks with pain on the right. She did have an outpatient LE venous doppler 04/30/20 negative for DVT. For several days she has had increasing dyspnea on exertion. Entresto and spiranolactone were preveiously stopped 2/2 rise in creatinine but her cardiologist,Dr. Harl Bowie [facility], has restarted these medications. She does not c/o chest pain. Because of these progressive symptoms she presents to [facility] for evaluation.   ED Course: afebrile, VSS. She is noted to have LE edema. She is noted to have marked DOE but maintains her oxygen saturation. Lab studies reveal normal Troponin at 9, BNP 132. CXR read as fluid overload with cardiomegaly. [facility] is called to admit patient for acute on chronic systolic heart failure.   Review of Systems: As per HPI otherwise 10 point review of systems negative.    Hospital Course:     Brief Narrative:  -64 y.o.femalewith past medical history of CAD (s/p DES to LAD and RCA in 09/2017), HFrEF, ischemic cardiomyopathy(EF 30-35% in 09/2017, at 35-40% by repeat in 12/2018), HTN, HLD, Type 2 DM, Stage 4 CKDand morbid obesity who admitted on 05/18/20 with worsening dyspnea, increased lower extremity swelling and acute on chronic systolic dysfunction CHF exacerbation  -  She did have an outpatient LE venous doppler 04/30/20 and repeat on 05/18/20 negative for DVT.  Marland Kitchen Entresto and spironolactone were preveiously stopped 2/2 rise in creatinine but her cardiologist,Dr. Harl Bowie [facility], has restarted these medications.  Repeat Echo this admission with EF of 30%   Assessment & Plan:   Active Problems:   Hypothyroidism   Essential hypertension   GERD   Diabetes mellitus type 2 with complications, uncontrolled ([place])   Acute combined systolic (congestive) and diastolic (congestive) heart failure (HCC)   Hyperlipidemia    Acute on chronic combined systolic (congestive) and diastolic (congestive) heart failure (HCC)    1)HFrEF/Acute on Chronic combined diastolic and systolic dysfunction CHF---.  Ejection fraction 30%.    Admitted with volume overload, treated with diuretics and Entresto, renal function worsened, so adjustments to diuretics and Entresto were made -Cardiology input appreciated -weight is up to 341.8 pounds from 336.4 pounds 48hrs  Ago -Clinically and symptomatically improved, continue Coreg ,  cardiology input appreciated with restart Entresto at much lower dose 24 -26 mg on 05/27/2020 -Lasix at 40 mg p.o. twice daily -Repeat  BMP as outpatient  2)CAD status post DES to the LAD and RCA in 2018.  ECG with old left bundle branch block and negative high-sensitivity troponin I  levels. -Remains chest pain-free, continue Coreg, aspirin, Plavix and Lipitor  3)AKI----acute kidney injury on CKD stage -IIIa -Worsening renal function is due to diuretics and Entresto use =-   creatinine on admission=1.21  , baseline creatinine = 1.0 to 1.2    , creatinine is now= 1.58 ( peak was 2.1) , renally adjust medications, avoid nephrotoxic agents / dehydration  / hypotension  Lasix and Entresto adjusted, Aldactone discontinued  4)Severe Hypothyroidism-TSH was 85 on 04/14/2020  -Currently on levothyroxine 200 mcg daily -Recheck TSH, Free T3 and T4 and patient will need follow-up with endocrinologist Dr. Loni Beckwith post discharge -This is probably contributing to her fatigue  5)DM2-A1c was 8.95 months ago reflecting uncontrolled DM PTA -Restart Jardiance , --Restart Tresiba  6)HTN-stable, continue Coreg ,  cardiology input appreciated --will restart Entresto at much lower dose 24 -26 mg on 05/27/2020 -Lasix at 40 mg p.o. twice daily -Repeat  BMP as outpatient  7)Bil LE edema nd Rt Knee Pain--  LE venous doppler 04/30/20 and repeat on 05/18/20 negative for DVT -No trauma, ABIs noted, -Outpatient  follow-up with Dr. Joylene Igo from vascular surgery advised she may need lower extremity ultrasounds and MRA for further work-up for possible PAD  Code Status: Full code Family Communication: Discussed with daughter previously Disposition --discharge home with home health services   consultants:   Cardiology  Procedures:   Echo:1. Images are limited. Suggest Definity contrast when adequate IV access  available.  2. Left ventricular ejection fraction, by estimation, is approximately  30%. The left ventricle has severely decreased function. Left ventricular  endocardial border not optimally defined to evaluate regional wall motion.  There is moderate left  ventricular hypertrophy. Left ventricular diastolic parameters are  indeterminate.  3. Right ventricular systolic function is normal. The right ventricular  size is normal.  4. Moderate pericardial effusion. The pericardial effusion is  circumferential.  5. The mitral valve is grossly normal. Trivial mitral valve  regurgitation.  6. The aortic valve is tricuspid. Aortic valve regurgitation is not  visualized.  7. The inferior vena cava is normal in size with greater than 50%  respiratory variability, suggesting right atrial pressure of 3 mmHg.    Discharge Condition: Stable,  Follow UP   Follow-up Information    Neale Burly, MD. Go on 05/31/2020.   Specialty: Internal Medicine Why: May 31, 2020 at [facility] information: [address] [place] [address] [phone]        [facility] Follow up.   Specialty: [place] Why: [place] staff will call you to schedule nursing and physical therapy visits       Nida, Marella Chimes, MD. Schedule an appointment as soon as possible for a visit in 1 week(s).   Specialty: Endocrinology Why: TSH is >>>> 85 Contact information: [place] [address] [phone]        [name], PA-C Follow up on 06/07/2020.   Specialty: Cardiology Why: [facility] Hospital Follow-up on 06/07/2020 at 12:30 PM.  Contact information: [place] [address] [phone]        Angelia Mould, MD. Schedule an appointment as soon as possible for a visit in 2 week(s).   Specialties: Vascular Surgery, Cardiology Why: Possible PAD may need lower extremity ultrasounds and MRA Contact information: [address] [place] [address] [phone]               Diet and  Activity recommendation:  As advised  Discharge Instructions     Discharge Instructions    Call MD for:  difficulty breathing, headache or visual disturbances   Complete by: As directed    Call MD for:  persistant dizziness or light-headedness   Complete by: As directed    Call MD for:  persistant nausea and vomiting   Complete by: As directed    Call MD for:  severe uncontrolled pain   Complete by: As directed    Call MD for:  temperature >100.4   Complete by: As directed    Diet - low sodium heart healthy   Complete by: As directed    Discharge instructions   Complete by: As directed    1)Avoid ibuprofen/Advil/Aleve/Motrin/Goody Powders/Naproxen/BC powders/Meloxicam/Diclofenac/Indomethacin and other Nonsteroidal anti-inflammatory medications as these will make you more likely to bleed and can cause stomach ulcers, can also cause Kidney problems.   2)Restart Entresto on 05/27/2020 at a much lower dose of 24 --26 mg tablets 1 twice a day  3)Very low-salt diet advised 4)Weigh yourself daily, call if you gain more than 3 pounds in 1 day or more than 5 pounds in 1 week as your diuretic medications may need to be adjusted 5)Limit your Fluid  intake to no more than 60 ounces (1.8 Liters) per day   Increase activity slowly   Complete by: As directed         Discharge Medications     Allergies as of 05/25/2020      Reactions   Ampicillin Other (See Comments)   "Allergic,"  per [facility]   Hydrocodone Other (See Comments)   "Allergic," per [facility]   Aspirin Nausea Only   Unasyn [ampicillin-sulbactam Sodium] Rash, Other (See Comments)   "Allergic," per [facility]      Medication List    STOP taking these medications   Entresto 97-103 MG Generic drug: sacubitril-valsartan Replaced by: Entresto 24-26 MG   ondansetron 4 MG disintegrating tablet Commonly known as: Zofran ODT   traMADol 50 MG tablet Commonly known as: ULTRAM     TAKE these medications   acetaminophen 325 MG tablet Commonly known as: TYLENOL Take 2 tablets (650 mg total) by mouth every 4 (four) hours as needed for mild pain, fever or headache.   aspirin EC 81 MG tablet Take 1 tablet (81 mg total) by mouth daily with breakfast. What changed: when to take this   atorvastatin 80 MG tablet Commonly known as: LIPITOR Take 1 tablet (80 mg total) by mouth daily. What changed: See the new instructions.   carvedilol 6.25 MG tablet Commonly known as: COREG Take 1 tablet (6.25 mg total) by mouth 2 (two) times daily with a meal.   cetirizine 10 MG tablet Commonly known as: ZYRTEC Take 10 mg by mouth daily as needed for allergies.   clopidogrel 75 MG tablet Commonly known as: PLAVIX TAKE 1 TABLET(75 MG) BY MOUTH DAILY WITH BREAKFAST What changed: See the new instructions.   Entresto 24-26 MG Generic drug: sacubitril-valsartan Take 1 tablet by mouth 2 (two) times daily. Start taking on: May 27, 2020 Replaces: Entresto 97-103 MG   furosemide 40 MG tablet Commonly known as: LASIX Take 1 tablet (40 mg total) by mouth 2 (two) times daily. What changed: when to take this   Jardiance 10 MG Tabs tablet Generic drug: empagliflozin Take 1 tablet (10 mg total) by mouth daily.   levothyroxine 200 MCG tablet Commonly known as: SYNTHROID Take 1 tablet (200 mcg total) by mouth  daily before breakfast. Take BEFORE Breakfast Start taking on: May 26, 2020 What changed:   medication strength  how much  to take  additional instructions  Another medication with the same name was removed. Continue taking this medication, and follow the directions you see here.   methocarbamol 500 MG tablet Commonly known as: ROBAXIN Take 1 tablet (500 mg total) by mouth every 8 (eight) hours as needed for muscle spasms.   NovoLOG FlexPen 100 UNIT/ML FlexPen Generic drug: insulin aspart Inject 18 Units into the skin 3 (three) times daily with meals. What changed: how much to take   nystatin powder Commonly known as: MYCOSTATIN/NYSTOP Apply topically 3 (three) times daily.   senna-docusate 8.6-50 MG tablet Commonly known as: Senokot-S Take 2 tablets by mouth 2 (two) times daily.   timolol 0.5 % ophthalmic solution Commonly known as: TIMOPTIC Place 1 drop into both eyes 2 (two) times daily.   Tyler Aas FlexTouch 100 UNIT/ML FlexTouch Pen Generic drug: insulin degludec Inject 60 Units into the skin daily.   Ventolin HFA 108 (90 Base) MCG/ACT inhaler Generic drug: albuterol Inhale 1-2 puffs into the lungs every 6 (six) hours as needed for wheezing or shortness of breath. What changed: how much to take       Major procedures and Radiology Reports - PLEASE review detailed and final reports for all details, in brief -    US Venous Img Lower Unilateral Right (DVT)  Result Date: 05/18/2020 CLINICAL DATA:  Right lower extremity pain and edema. EXAM: RIGHT LOWER EXTREMITY VENOUS DOPPLER ULTRASOUND TECHNIQUE: Gray-scale sonography with graded compression, as well as color Doppler and duplex ultrasound were performed to evaluate the lower extremity deep venous systems from the level of the common femoral vein and including the common femoral, femoral, profunda femoral, popliteal and calf veins including the posterior tibial, peroneal and gastrocnemius veins when visible. The superficial great saphenous vein was also interrogated. Spectral Doppler was utilized to evaluate flow at rest and with distal  augmentation maneuvers in the common femoral, femoral and popliteal veins. COMPARISON:  None. FINDINGS: Contralateral Common Femoral Vein: Respiratory phasicity is normal and symmetric with the symptomatic side. No evidence of thrombus. Normal compressibility. Common Femoral Vein: No evidence of thrombus. Normal compressibility, respiratory phasicity and response to augmentation. Saphenofemoral Junction: No evidence of thrombus. Normal compressibility and flow on color Doppler imaging. Profunda Femoral Vein: No evidence of thrombus. Normal compressibility and flow on color Doppler imaging. Femoral Vein: No evidence of thrombus. Normal compressibility, respiratory phasicity and response to augmentation. Popliteal Vein: No evidence of thrombus. Normal compressibility, respiratory phasicity and response to augmentation. Calf Veins: No evidence of thrombus. Normal compressibility and flow on color Doppler imaging. Superficial Great Saphenous Vein: No evidence of thrombus. Normal compressibility. Venous Reflux:  None. Other Findings: No evidence of superficial thrombophlebitis or abnormal fluid collection. IMPRESSION: No evidence of right lower extremity deep venous thrombosis. Electronically Signed   By: Aletta Edouard M.D.   On: 05/18/2020 12:19   US Venous Img Lower Unilateral Right (DVT)  Result Date: 04/30/2020 CLINICAL DATA:  Right lower extremity pain and edema after fall 2 weeks ago. Former smoker. Patient is currently on anticoagulation. Evaluate for DVT. EXAM: RIGHT LOWER EXTREMITY VENOUS DOPPLER ULTRASOUND TECHNIQUE: Gray-scale sonography with graded compression, as well as color Doppler and duplex ultrasound were performed to evaluate the lower extremity deep venous systems from the level of the common femoral vein and including the common femoral, femoral, profunda femoral, popliteal and calf veins including the  posterior tibial, peroneal and gastrocnemius veins when visible. The superficial great  saphenous vein was also interrogated. Spectral Doppler was utilized to evaluate flow at rest and with distal augmentation maneuvers in the common femoral, femoral and popliteal veins. COMPARISON:  None. FINDINGS: Contralateral Common Femoral Vein: Respiratory phasicity is normal and symmetric with the symptomatic side. No evidence of thrombus. Normal compressibility. Common Femoral Vein: No evidence of thrombus. Normal compressibility, respiratory phasicity and response to augmentation. Saphenofemoral Junction: No evidence of thrombus. Normal compressibility and flow on color Doppler imaging. Profunda Femoral Vein: No evidence of thrombus. Normal compressibility and flow on color Doppler imaging. Femoral Vein: No evidence of thrombus. Normal compressibility, respiratory phasicity and response to augmentation. Popliteal Vein: No evidence of thrombus. Normal compressibility, respiratory phasicity and response to augmentation. Calf Veins: No evidence of thrombus. Normal compressibility and flow on color Doppler imaging. Superficial Great Saphenous Vein: No evidence of thrombus. Normal compressibility. Venous Reflux:  None. Other Findings:  None. IMPRESSION: No evidence of DVT within the right lower extremity. Electronically Signed   By: Sandi Mariscal M.D.   On: 04/30/2020 13:57   US ARTERIAL ABI (SCREENING LOWER EXTREMITY)  Result Date: 05/24/2020 CLINICAL DATA:  Bilateral claudication, rest pain. Diabetes, hypertension, hyperlipidemia, obesity, previous tobacco abuse EXAM: NONINVASIVE PHYSIOLOGIC VASCULAR STUDY OF BILATERAL LOWER EXTREMITIES TECHNIQUE: Evaluation of both lower extremities were performed at rest, including calculation of ankle-brachial indices with single level Doppler, pressure and pulse volume recording. COMPARISON:  None. FINDINGS: Right ABI:  1.11 Left ABI:  1.24 Right Lower Extremity: Normal dorsalis pedis waveform. No flow identified in posterior tibial artery. Left Lower Extremity:  Monophasic  waveforms distally. IMPRESSION: While the ABIs are normal, limited distal evaluation is abnormal with right posterior tibial occlusion and abnormal distal left lower extremity arterial waveforms, suggesting possible medial calcifications artificially elevated the ABIs and underestimates severity of lower extremity arterial occlusive disease. Should the patient fail conservative treatment, consider MRA runoff (not affected by vascular calcifications, no radiation risk, can be performed noncontrast in the setting of renal dysfunction) to better define the site and nature of arterial occlusive disease and delineate treatment options. Electronically Signed   By: Lucrezia Europe M.D.   On: 05/24/2020 16:17   DG Chest Port 1 View  Result Date: 05/17/2020 CLINICAL DATA:  Shortness of breath and leg swelling EXAM: PORTABLE CHEST 1 VIEW COMPARISON:  Radiograph 12/26/2019 FINDINGS: Underpenetration of the image. Hazy basilar and perihilar predominant opacities with central vascular congestion and marked cardiomegaly suggesting pulmonary edema. No pneumothorax or visible effusion. No acute osseous or soft tissue abnormality. Degenerative changes are present in the imaged spine and shoulders. IMPRESSION: Features compatible with CHF/volume overload with pulmonary edema and marked cardiomegaly. Electronically Signed   By: Lovena Le M.D.   On: 05/17/2020 22:58   DG Knee Complete 4 Views Right  Result Date: 05/19/2020 CLINICAL DATA:  Right knee pain for 2 weeks EXAM: RIGHT KNEE - COMPLETE 4+ VIEW COMPARISON:  02/14/2016 FINDINGS: Frontal, bilateral oblique, lateral views of the right knee are obtained. No fracture, subluxation, or dislocation. There is 3 compartmental osteoarthritis most pronounced in the patellofemoral compartment. This is progressive since prior study. Small joint effusion. Mild diffuse subcutaneous edema. IMPRESSION: 1. 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 2. Small  effusion. 3. Diffuse subcutaneous edema. Electronically Signed   By: Randa Ngo M.D.   On: 05/19/2020 15:03   ECHOCARDIOGRAM COMPLETE  Result Date: 05/18/2020    ECHOCARDIOGRAM REPORT   Patient Name:  [name] Date of Exam: 05/18/2020 Medical Rec #:  [id number]       Height:       65.0 in Accession #:    [id number]      Weight:       335.0 lb Date of Birth:  May 25, 1956        BSA:          2.463 m Patient Age:    52 years        BP:           128/71 mmHg Patient Gender: F               HR:           78 bpm. Exam Location:  [facility] Procedure: 2D Echo Indications:    CHF-Acute Systolic 761.60 / V37.10  History:        Patient has prior history of Echocardiogram examinations, most                 recent 12/16/2018. CAD, TIA, Signs/Symptoms:Chest Pain; Risk                 Factors:Hypertension, Former Smoker, Diabetes and Dyslipidemia.  Sonographer:    Leavy Cella RDCS (AE) Referring Phys: [place]  1. Images are limited. Suggest Definity contrast when adequate IV access available.  2. Left ventricular ejection fraction, by estimation, is approximately 30%. The left ventricle has severely decreased function. Left ventricular endocardial border not optimally defined to evaluate regional wall motion. There is moderate left ventricular hypertrophy. Left ventricular diastolic parameters are indeterminate.  3. Right ventricular systolic function is normal. The right ventricular size is normal.  4. Moderate pericardial effusion. The pericardial effusion is circumferential.  5. The mitral valve is grossly normal. Trivial mitral valve regurgitation.  6. The aortic valve is tricuspid. Aortic valve regurgitation is not visualized.  7. The inferior vena cava is normal in size with greater than 50% respiratory variability, suggesting right atrial pressure of 3 mmHg. FINDINGS  Left Ventricle: Left ventricular ejection fraction, by estimation, is 30%. The left ventricle has severely decreased  function. Left ventricular endocardial border not optimally defined to evaluate regional wall motion. The left ventricular internal cavity size was normal in size. There is moderate left ventricular hypertrophy. Left ventricular diastolic parameters are indeterminate. Right Ventricle: The right ventricular size is normal. No increase in right ventricular wall thickness. Right ventricular systolic function is normal. Left Atrium: Left atrial size was normal in size. Right Atrium: Right atrial size was normal in size. Pericardium: A moderately sized pericardial effusion is present. The pericardial effusion is circumferential. Mitral Valve: The mitral valve is grossly normal. Trivial mitral valve regurgitation. Tricuspid Valve: The tricuspid valve is not well visualized. Tricuspid valve regurgitation is trivial. Aortic Valve: The aortic valve is tricuspid. Aortic valve regurgitation is not visualized. Mild aortic valve annular calcification. Pulmonic Valve: The pulmonic valve was not well visualized. Pulmonic valve regurgitation is not visualized. Aorta: The aortic root is normal in size and structure. Venous: The inferior vena cava is normal in size with greater than 50% respiratory variability, suggesting right atrial pressure of 3 mmHg. IAS/Shunts: No atrial level shunt detected by color flow Doppler.  LEFT VENTRICLE PLAX 2D LVIDd:         5.38 cm  Diastology LVIDs:         3.57 cm  LV e' lateral:   4.05 cm/s LV PW:  1.69 cm  LV E/e' lateral: 15.6 LV IVS:        1.47 cm  LV e' medial:    3.98 cm/s LVOT diam:     2.10 cm  LV E/e' medial:  15.9 LVOT Area:     3.46 cm  RIGHT VENTRICLE RV S prime:     8.50 cm/s TAPSE (M-mode): 1.9 cm LEFT ATRIUM           Index       RIGHT ATRIUM           Index LA diam:      3.80 cm 1.54 cm/m  RA Area:     18.30 cm LA Vol (A2C): 48.1 ml 19.53 ml/m RA Volume:   56.10 ml  22.78 ml/m LA Vol (A4C): 63.4 ml 25.74 ml/m   AORTA Ao Root diam: 2.70 cm MITRAL VALVE MV Area (PHT):  4.39 cm    SHUNTS MV Decel Time: 173 msec    Systemic Diam: 2.10 cm MV E velocity: 63.10 cm/s MV A velocity: 61.10 cm/s MV E/A ratio:  1.03 Rozann Lesches MD Electronically signed by Rozann Lesches MD Signature Date/Time: 05/18/2020/12:55:38 PM    Final    [place] EKG SITE RITE  Result Date: 05/18/2020 If Site Rite image not attached, placement could not be confirmed due to current cardiac rhythm.   Micro Results   Recent Results (from the past 240 hour(s))  SARS Coronavirus 2 by RT PCR (hospital order, performed in [facility] hospital lab) Nasopharyngeal Nasopharyngeal Swab     Status: None   Collection Time: 05/17/20 11:30 PM   Specimen: Nasopharyngeal Swab  Result Value Ref Range Status   SARS Coronavirus 2 NEGATIVE NEGATIVE Final    Comment: (NOTE) SARS-CoV-2 target nucleic acids are NOT DETECTED. The SARS-CoV-2 RNA is generally detectable in upper and lower respiratory specimens during the acute phase of infection. The lowest concentration of SARS-CoV-2 viral copies this assay can detect is 250 copies / mL. A negative result does not preclude SARS-CoV-2 infection and should not be used as the sole basis for treatment or other patient management decisions.  A negative result may occur with improper specimen collection / handling, submission of specimen other than nasopharyngeal swab, presence of viral mutation(s) within the areas targeted by this assay, and inadequate number of viral copies (<250 copies / mL). A negative result must be combined with clinical observations, patient history, and epidemiological information. Fact Sheet for Patients:   StrictlyIdeas.no Fact Sheet for Healthcare Providers: BankingDealers.co.za This test is not yet approved or cleared  by the [place] FDA and has been authorized for detection and/or diagnosis of SARS-CoV-2 by FDA under an Emergency Use Authorization (EUA).  This EUA will remain in effect  (meaning this test can be used) for the duration of the COVID-19 declaration under Section 564(b)(1) of the Act, 21 U.S.C. section 360bbb-3(b)(1), unless the authorization is terminated or revoked sooner. Performed at [facility], [address], [place], [place] [address]        Today   Subjective    Marne Meline today has no complaints -No significant DOE, no chest pains          Patient has been seen and examined prior to discharge   Objective   Blood pressure 108/61, pulse 77, temperature (!) 97.3 F (36.3 C), temperature source Oral, resp. rate 20, height 5\' 5"  (1.651 m), weight (!) 154.7 kg, SpO2 97 %.  No intake or output data in the 24  hours ending 05/25/20 1637  Exam General exam: Alert, awake, oriented x 3, obese Respiratory system: Diminished without wheezing cardiovascular system:RRR. No murmurs, rubs, gallops. Gastrointestinal system: Abdomen is nondistended, soft and nontender.  Increased truncal adiposity normal bowel sounds heard. Central nervous system: Alert and oriented.  Generalized weakness and fatigue, no focal neurological deficits.  Extremities: 1 + pitting edema bilaterally  skin: No rashes, lesions or ulcers Psychiatry: Judgement and insight appear normal. Mood & affect appropriate.    Data Review   CBC w Diff:  Lab Results  Component Value Date   WBC 8.1 05/24/2020   HGB 10.4 (L) 05/24/2020   HCT 33.0 (L) 05/24/2020   PLT 196 05/24/2020   LYMPHOPCT 15 12/31/2019   MONOPCT 4 12/31/2019   EOSPCT 0 12/31/2019   BASOPCT 0 12/31/2019    CMP:  Lab Results  Component Value Date   NA 138 05/25/2020   K 4.3 05/25/2020   CL 98 05/25/2020   CO2 30 05/25/2020   BUN 71 (H) 05/25/2020   CREATININE 1.63 (H) 05/25/2020   CREATININE 1.58 (H) 05/25/2020   PROT 7.4 12/31/2019   ALBUMIN 3.2 (L) 05/25/2020   BILITOT 0.5 12/31/2019   ALKPHOS 212 (H) 12/31/2019   AST 16 12/31/2019   ALT 27 12/31/2019  .   Total Discharge time is about 33  minutes  Roxan Hockey M.D on 05/25/2020 at 4:37 PM  Go to [email or web address] -  for contact info  [facility] - Office  [phone]

## 2020-05-25 NOTE — Discharge Instructions (Signed)
1)Avoid ibuprofen/Advil/Aleve/Motrin/Goody Powders/Naproxen/BC powders/Meloxicam/Diclofenac/Indomethacin and other Nonsteroidal anti-inflammatory medications as these will make you more likely to bleed and can cause stomach ulcers, can also cause Kidney problems.   2)Restart Entresto on 05/27/2020 at a much lower dose of 24 --26 mg tablets 1 twice a day  3)Very low-salt diet advised 4)Weigh yourself daily, call if you gain more than 3 pounds in 1 day or more than 5 pounds in 1 week as your diuretic medications may need to be adjusted 5)Limit your Fluid  intake to no more than 60 ounces (1.8 Liters) per day

## 2020-05-25 NOTE — Progress Notes (Addendum)
Progress Note  Patient Name: Maureen Jackson Date of Encounter: 05/25/2020  Primary Cardiologist: Carlyle Dolly, MD   Subjective   No specific chest pain or palpitations. Continues to experience pain along her right leg (doppler negative for DVT this admission and ABI yesterday concerning for PAD and MRA recommended).   Inpatient Medications    Scheduled Meds: . aspirin EC  81 mg Oral Daily  . atorvastatin  80 mg Oral q1800  . carvedilol  6.25 mg Oral BID WC  . Chlorhexidine Gluconate Cloth  6 each Topical Daily  . clopidogrel  75 mg Oral Daily  . enoxaparin (LOVENOX) injection  75 mg Subcutaneous Q24H  . insulin aspart  18 Units Subcutaneous TID WC  . insulin glargine  60 Units Subcutaneous Daily  . levothyroxine  200 mcg Oral QAC breakfast  . nystatin   Topical TID  . senna-docusate  2 tablet Oral BID  . sodium chloride flush  10-40 mL Intracatheter Q12H  . sodium chloride flush  3 mL Intravenous Q12H  . timolol  1 drop Both Eyes BID   Continuous Infusions: . sodium chloride     PRN Meds: sodium chloride, acetaminophen, albuterol, alum & mag hydroxide-simeth, methocarbamol, ondansetron (ZOFRAN) IV, ondansetron, sodium chloride flush, sodium chloride flush   Vital Signs    Vitals:   05/24/20 0429 05/24/20 0444 05/24/20 1536 05/24/20 2155  BP: 121/74  113/75 (!) 103/49  Pulse: 79  77 81  Resp: 20  17 20   Temp: (!) 97.5 F (36.4 C)  98.4 F (36.9 C) 98.9 F (37.2 C)  TempSrc: Oral  Oral Oral  SpO2: 95%  97% 95%  Weight:  (!) 154.6 kg  (!) 154.7 kg  Height:        Intake/Output Summary (Last 24 hours) at 05/25/2020 1050 Last data filed at 05/24/2020 1200 Gross per 24 hour  Intake --  Output 900 ml  Net -900 ml    Last 3 Weights 05/24/2020 05/24/2020 05/23/2020  Weight (lbs) 341 lb 0.8 oz 340 lb 13.3 oz 336 lb 6.8 oz  Weight (kg) 154.7 kg 154.6 kg 152.6 kg      Telemetry    NSR, HR in 60's to 70's. No significant arrhythmias.   - Personally  Reviewed  ECG    No new tracings.   Physical Exam   General: Well developed, obese female appearing in no acute distress. Head: Normocephalic, atraumatic.  Neck: Supple without bruits, JVD difficult to assess secondary to body habitus. Lungs:  Resp regular and unlabored, CTA without wheezing or rales. Heart: RRR, S1, S2, no S3, S4, or murmur; no rub. Abdomen: Soft, non-tender, non-distended with normoactive bowel sounds. No hepatomegaly. No rebound/guarding. No obvious abdominal masses. Extremities: No clubbing, cyanosis, 1+ pitting edema bilaterally. Distal pedal pulses are 2+ bilaterally. Neuro: Alert and oriented X 3. Moves all extremities spontaneously. Psych: Normal affect.  Labs    Chemistry Recent Labs  Lab 05/23/20 0822 05/24/20 0516 05/25/20 0509  NA 133* 133* 138  K 3.9 4.2 4.3  CL 93* 92* 98  CO2 31 29 30   GLUCOSE 110* 117* 143*  BUN 64* 74* 71*  CREATININE 1.77* 2.10* 1.58*  1.63*  CALCIUM 8.4* 8.1* 8.5*  ALBUMIN  --   --  3.2*  GFRNONAA 30* 25* 35*  33*  GFRAA 35* 29* 40*  39*  ANIONGAP 9 12 10      Hematology Recent Labs  Lab 05/24/20 0516  WBC 8.1  RBC 3.71*  HGB 10.4*  HCT 33.0*  MCV 88.9  MCH 28.0  MCHC 31.5  RDW 13.3  PLT 196    Cardiac EnzymesNo results for input(s): TROPONINI in the last 168 hours. No results for input(s): TROPIPOC in the last 168 hours.   BNPNo results for input(s): BNP, PROBNP in the last 168 hours.   DDimer No results for input(s): DDIMER in the last 168 hours.   Radiology    US ARTERIAL ABI (SCREENING LOWER EXTREMITY)  Result Date: 05/24/2020 CLINICAL DATA:  Bilateral claudication, rest pain. Diabetes, hypertension, hyperlipidemia, obesity, previous tobacco abuse EXAM: NONINVASIVE PHYSIOLOGIC VASCULAR STUDY OF BILATERAL LOWER EXTREMITIES TECHNIQUE: Evaluation of both lower extremities were performed at rest, including calculation of ankle-brachial indices with single level Doppler, pressure and pulse volume  recording. COMPARISON:  None. FINDINGS: Right ABI:  1.11 Left ABI:  1.24 Right Lower Extremity: Normal dorsalis pedis waveform. No flow identified in posterior tibial artery. Left Lower Extremity:  Monophasic waveforms distally. IMPRESSION: While the ABIs are normal, limited distal evaluation is abnormal with right posterior tibial occlusion and abnormal distal left lower extremity arterial waveforms, suggesting possible medial calcifications artificially elevated the ABIs and underestimates severity of lower extremity arterial occlusive disease. Should the patient fail conservative treatment, consider MRA runoff (not affected by vascular calcifications, no radiation risk, can be performed noncontrast in the setting of renal dysfunction) to better define the site and nature of arterial occlusive disease and delineate treatment options. Electronically Signed   By: Lucrezia Europe M.D.   On: 05/24/2020 16:17    Cardiac Studies   Echocardiogram: 05/2020 IMPRESSIONS     1. Images are limited. Suggest Definity contrast when adequate IV access  available.   2. Left ventricular ejection fraction, by estimation, is approximately  30%. The left ventricle has severely decreased function. Left ventricular  endocardial border not optimally defined to evaluate regional wall motion.  There is moderate left  ventricular hypertrophy. Left ventricular diastolic parameters are  indeterminate.   3. Right ventricular systolic function is normal. The right ventricular  size is normal.   4. Moderate pericardial effusion. The pericardial effusion is  circumferential.   5. The mitral valve is grossly normal. Trivial mitral valve  regurgitation.   6. The aortic valve is tricuspid. Aortic valve regurgitation is not  visualized.   7. The inferior vena cava is normal in size with greater than 50%  respiratory variability, suggesting right atrial pressure of 3 mmHg.   Patient Profile     64 y.o. female w/ PMH of CAD  (s/p DES to LAD and RCA in 09/2017), ischemic cardiomyopathy (EF 30-35% in 09/2017, at 35-40% by repeat in 12/2018), HTN, HLD, Type 2 DM, Stage 4 CKD and obesity who is currently admitted for an acute CHF exacerbation.   Assessment & Plan    1. Acute on Chronic Combined Systolic and Diastolic CHF - BNP was minimally elevated at 132 on admission but likely inaccurate given her obesity. Repeat echo shows EF at 30% with a moderate pericardial effusion (EF 30-35% in 2018, at 35-40% in 12/2018) - she was previously receiving IV Lasix 60mg  BID and Metolazone 5mg  but creatinine trended upwards and diuretic therapy has since been held. Weight was down to 336 lbs on 6/13 but is starting to trend upwards to 341 lbs today. Given improvement in renal function, will review with Dr. Johnsie Cancel in regards to restarting PO diuretic therapy today (was on Lasix 40mg  daily prior to admission).  - continue Coreg. She was on Entresto 97-103mg   BID prior to admission which is now held given her AKI and hypotension. Would try restarting at a lower dose of 24-26mg  BID either tomorrow or an an outpatient pending BP trend and renal function.   2. CAD - she is s/p DES to LAD and RCA in 09/2017.  EKG shows known LBBB and HS Troponin values has been negative negative this admission. Echo similar to prior imaging with EF of 30%.  - remains on ASA, Plavix, Atorvastatin and Coreg.    3. HTN - BP has been soft at 103/49 - 113/75 within the past 24 hours. Continue Coreg 6.25mg  BID. Entresto discontinued yesterday given hypotension.    4. Type 2 DM - Hgb A1c elevated to 8.9 in 12/2019. Being followed by the admitting team.   5. Stage 2-3 CKD - previous baseline creatinine 1.2 - 1.4. Peaked at 2.10 this admission, improved to 1.58 today.    6. Hypothyroidism - TSH was elevated to 85.399 in 04/2020, improved to 14.385 this admission. Being followed by her PCP as an outpatient. She remains on Synthroid 280mcg daily.    7. Possible  PAD - ABI's on 6/14 showed limited distal evaluation that was abnormal with right posterior tibial occlusion and abnormal distal left lower extremity arterial waveforms, suggesting possible medial calcifications artificially elevated the ABIs and underestimates severity of lower extremity arterial occlusive disease. - MRA was recommended for further evaluation if she failed conservative treatment. - continue ASA, Plavix and statin therapy.    For questions or updates, please contact [place] Please consult [email or web address] for contact info under Cardiology/STEMI.   Arna Medici , PA-C 10:50 AM 05/25/2020 Pager: [phone]  Patient examined chart reviewed. She has limited mobility Needs PT/OT Her CHF has improved lungs clear distant heart sounds Plus 1 edema. Will resume diuretic lasix BID Will need f/u BMET with DR Harl Bowie and consideration for starting entresto back  Will arrange outpatient f/u with Dr Harl Bowie. She will need outpatient f/u with [facility] for ? PAD would start with [place] of LE;s as ABI's are non compressible   Cardiology will sign off F/U with Dr Harl Bowie arranged

## 2020-05-26 LAB — T3, FREE: T3, Free: 1.9 pg/mL — ABNORMAL LOW (ref 2.0–4.4)

## 2020-05-29 DIAGNOSIS — R69 Illness, unspecified: Secondary | ICD-10-CM | POA: Diagnosis not present

## 2020-06-01 NOTE — Progress Notes (Signed)
Cardiology Office Note    Date:  06/07/2020   ID:  Maureen Jackson, DOB [birth year], MRN [id number]  PCP:  Neale Burly, MD  Cardiologist: Carlyle Dolly, MD EPS: None  Chief Complaint  Patient presents with  . Hospitalization Follow-up    History of Present Illness:  Maureen Jackson is a 64 y.o. female with past medical history of CAD (s/p DES to LAD and RCA in 09/2017), ischemic cardiomyopathy (EF 30-35% in 09/2017, at 35-40% by repeat in 12/2018), HTN, HLD, Type 2 DM, Stage 4 CKD and obesity who presents for a 29-month follow-up telehealth visit.    She was admitted to [facility] in 12/2019 for acute hypoxic respiratory failure in the setting of COVID-19 PNA. She was treated with Remdesivir along with IV steroids. She was treated for acute on chronic CHF exacerbation but developed an AKI and hypotension with diuretic therapy, therefore Aldactone and Entresto were held at discharge.  Entresto was restarted by Dr. Harl Bowie 01/28/2020 24/26 mg twice daily.   Patient saw Bernerd Pho, PA-C 02/26/2020 was still having dyspnea on exertion and fatigue since having Covid and BP was running high and Entresto titrated up to 49/51 mg twice daily.  Plans were to optimize Entresto and add back spironolactone if labs allow.   I had a telemedicine visit with the patient 04/13/2020 at which time she was complaining of right leg throbbing for which she was on a muscle relaxant.  Creatinine stable so Entresto increased to 97/103 mg twice daily. TSH 85. Was to f/u with PCP  Patient was discharged from hospital 05/25/2020 after being treated with acute pulmonary edema. Entresto and spironolactone were stopped due to rising creatinine but restarted at lower doses.  Repeat echo EF 30%.  Chronic lower extremity pain Dopplers negative for DVT 04/30/2020 and 05/18/2020.  Patient comes in for f/u. Says she taking a higher dose entresto-97/103 mg bid. Overall breathing and swelling better. Still complaining of a  throbbing pain in her right leg when she walks. Trying to watch her diet better. Weight down 4 lbs. Feels good.   Past Medical History:  Diagnosis Date  . 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 Bleeding from mouth    when she brushed teeth or ate  . Chronic systolic CHF (congestive heart failure) ([place])   . Coronary artery disease 09/2017   a. multivessel CAD by cath in 09/2017 and not felt to be a CABG candidate --> underwent two-vessel PCI with DES to the LAD and DES to the RCA  . Diabetes mellitus   . Dyspnea   . Glaucoma   . Hypertension   . Left bundle branch block   . Morbid obesity ([place])   . Obstructive sleep apnea    does not wear CPAP  . Thyroid disease     Past Surgical History:  Procedure Laterality Date  . ABDOMINAL HYSTERECTOMY    . CARDIAC CATHETERIZATION  09/12/2017  . CORONARY STENT INTERVENTION  09/12/2017   STENT RESOLUTE ONYX 4.09W11 drug eluting stent was successfully placed  . CORONARY STENT INTERVENTION N/A 09/12/2017   Procedure: CORONARY STENT INTERVENTION;  Surgeon: Leonie Man, MD;  Location: [place] [facility];  Service: Cardiovascular;  Laterality: N/A;  . INCISION AND DRAINAGE PERIRECTAL ABSCESS Left 09/16/2019   Procedure: IRRIGATION AND DEBRIDEMENT LABIA ABSCESS;  Surgeon: Coralie Keens, MD;  Location: [place];  Service: General;  Laterality: Left;  . IR FLUORO GUIDE CV LINE RIGHT  09/16/2019  .  IR US GUIDE VASC ACCESS RIGHT  09/16/2019  . LEFT HEART CATH AND CORONARY ANGIOGRAPHY N/A 09/12/2017   Procedure: LEFT HEART CATH AND CORONARY ANGIOGRAPHY;  Surgeon: Leonie Man, MD;  Location: [place] [facility];  Service: Cardiovascular;  Laterality: N/A;  . MASS EXCISION N/A 12/18/2018   Procedure: EXCISION TONGUE MASS;  Surgeon: Leta Baptist, MD;  Location: [place] [facility];  Service: ENT;  Laterality: N/A;  . RIGHT/LEFT HEART CATH AND CORONARY ANGIOGRAPHY N/A 09/10/2017   Procedure: RIGHT/LEFT HEART CATH AND CORONARY ANGIOGRAPHY;  Surgeon: Troy Sine, MD;   Location: [place] [facility];  Service: Cardiovascular;  Laterality: N/A;  . THYROID SURGERY      Current Medications: Current Meds  Medication Sig  . acetaminophen (TYLENOL) 325 MG tablet Take 2 tablets (650 mg total) by mouth every 4 (four) hours as needed for mild pain, fever or headache.  Marland Kitchen aspirin EC 81 MG tablet Take 1 tablet (81 mg total) by mouth daily with breakfast.  . atorvastatin (LIPITOR) 80 MG tablet Take 1 tablet (80 mg total) by mouth daily.  . carvedilol (COREG) 6.25 MG tablet Take 1 tablet (6.25 mg total) by mouth 2 (two) times daily with a meal.  . cetirizine (ZYRTEC) 10 MG tablet Take 10 mg by mouth daily as needed for allergies.   Marland Kitchen clopidogrel (PLAVIX) 75 MG tablet TAKE 1 TABLET(75 MG) BY MOUTH DAILY WITH BREAKFAST  . furosemide (LASIX) 40 MG tablet Take 1 tablet (40 mg total) by mouth 2 (two) times daily.  Marland Kitchen levothyroxine (SYNTHROID) 200 MCG tablet Take 1 tablet (200 mcg total) by mouth daily before breakfast. Take BEFORE Breakfast  . NOVOLOG FLEXPEN 100 UNIT/ML FlexPen Inject 18 Units into the skin 3 (three) times daily with meals.  . senna-docusate (SENOKOT-S) 8.6-50 MG tablet Take 2 tablets by mouth 2 (two) times daily.  . timolol (TIMOPTIC) 0.5 % ophthalmic solution Place 1 drop into both eyes 2 (two) times daily.  . TRESIBA FLEXTOUCH 100 UNIT/ML SOPN FlexTouch Pen Inject 60 Units into the skin daily.   . VENTOLIN HFA 108 (90 Base) MCG/ACT inhaler Inhale 1-2 puffs into the lungs every 6 (six) hours as needed for wheezing or shortness of breath.     Allergies:   Ampicillin, Aspirin, Hydrocodone, and Unasyn [ampicillin-sulbactam sodium]   Social History   Socioeconomic History  . Marital status: Widowed    Spouse name: Not on file  . Number of children: Not on file  . Years of education: Not on file  . Highest education level: Not on file  Occupational History  . 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 Smoking status: Former  Smoker    Quit date: 12/14/2005    Years since quitting: 14.4  . Smokeless tobacco: Never Used  . Tobacco comment: QUIT IN 2005  Vaping Use  . Vaping Use: Never used  Substance and Sexual Activity  . Alcohol use: No  . Drug use: No  . Sexual activity: Never  Other Topics Concern  . Not on file  Social History Narrative  . Not on file   Social Determinants of Health   Financial Resource Strain:   . Difficulty of Paying Living Expenses:   Food Insecurity: No Food Insecurity  . Worried About Charity fundraiser in the Last Year: Never true  . Ran Out of Food in the Last Year: Never true  Transportation Needs: Unmet Transportation Needs  . Lack of Transportation (Medical): Yes  .  Lack of Transportation (Non-Medical): No  Physical Activity:   . Days of Exercise per Week:   . Minutes of Exercise per Session:   Stress:   . Feeling of Stress :   Social Connections:   . Frequency of Communication with Friends and Family:   . Frequency of Social Gatherings with Friends and Family:   . Attends Religious Services:   . Active Member of Clubs or Organizations:   . Attends Archivist Meetings:   Marland Kitchen Marital Status:      Family History:  The patient's family history includes Cancer in her mother; Diabetes in her mother; Hypertension in her mother and sister.   ROS:   Please see the history of present illness.    ROS All other systems reviewed and are negative.   PHYSICAL EXAM:   VS:  BP 133/73   Pulse 80   Ht 5\' 5"  (1.651 m)   Wt (!) 337 lb 6.4 oz (153 kg)   SpO2 96%   BMI 56.15 kg/m   Physical Exam  GEN: Obese, in no acute distress  Neck: no JVD, carotid bruits, or masses Cardiac:RRR; no murmurs, rubs, or gallops  Respiratory:  clear to auscultation bilaterally, normal work of breathing GI: soft, nontender, nondistended, + BS Ext: without cyanosis, clubbing, or edema, Good distal pulses bilaterally Neuro:  Alert and Oriented x 3 Psych: euthymic mood, full  affect  Wt Readings from Last 3 Encounters:  06/07/20 (!) 337 lb 6.4 oz (153 kg)  05/24/20 (!) 341 lb 0.8 oz (154.7 kg)  04/13/20 (!) 335 lb (152 kg)      Studies/Labs Reviewed:   EKG:  EKG is not ordered today.   Recent Labs: 12/31/2019: ALT 27 05/17/2020: B Natriuretic Peptide 132.0 05/23/2020: Magnesium 2.3; TSH 14.385 05/24/2020: Hemoglobin 10.4; Platelets 196 05/25/2020: BUN 71; Creatinine, Ser 1.63; Creatinine, Ser 1.58; Potassium 4.3; Sodium 138   Lipid Panel    Component Value Date/Time   CHOL 147 07/17/2018 0152   TRIG 124 12/26/2019 1546   HDL 31 (L) 07/17/2018 0152   CHOLHDL 4.7 07/17/2018 0152   VLDL 33 07/17/2018 0152   LDLCALC 83 07/17/2018 0152    Additional studies/ records that were reviewed today include:  Echo 05/18/2020 IMPRESSIONS     1. Images are limited. Suggest Definity contrast when adequate IV access  available.   2. Left ventricular ejection fraction, by estimation, is approximately  30%. The left ventricle has severely decreased function. Left ventricular  endocardial border not optimally defined to evaluate regional wall motion.  There is moderate left  ventricular hypertrophy. Left ventricular diastolic parameters are  indeterminate.   3. Right ventricular systolic function is normal. The right ventricular  size is normal.   4. Moderate pericardial effusion. The pericardial effusion is  circumferential.   5. The mitral valve is grossly normal. Trivial mitral valve  regurgitation.   6. The aortic valve is tricuspid. Aortic valve regurgitation is not  visualized.   7. The inferior vena cava is normal in size with greater than 50%  respiratory variability, suggesting right atrial pressure of 3 mmHg.  Echocardiogram: 12/2018 Study Conclusions   - Limited study to evaluate LV function.  - Technically difficult study, poor visualization. Technical issues    with attempts at echo contrast injections, inadequate contrast    effect on imaging  despite multiple doses.  - LVEF appears grossly in the 35-40% range.    ASSESSMENT:    1. Chronic combined systolic and diastolic  CHF (congestive heart failure) ([place])   2. Coronary artery disease involving native coronary artery of native heart without angina pectoris   3. Stage 3 chronic kidney disease, unspecified whether stage 3a or 3b CKD   4. Essential hypertension      PLAN:  In order of problems listed above:  1. chronic combined heart failure   Echocardiogram 05/18/2020 LVEF approximately 30% with very limited images, patient was sent home on lower dose Entresto 24/26 mg twice daily but patient thinks she is taking the highest dose 97/103.  She will verify when she gets home.  Recheck renal today.   2.  CAD status post DES to the LAD and RCA in 2018.    No angina   3.  CKD stage II-III.  Creatinine 1.58 at discharge recheck today   4.  Essential hypertension stable         Medication Adjustments/Labs and Tests Ordered: Current medicines are reviewed at length with the patient today.  Concerns regarding medicines are outlined above.  Medication changes, Labs and Tests ordered today are listed in the Patient Instructions below. Patient Instructions  Medication Instructions:  Your physician recommends that you continue on your current medications as directed. Please refer to the Current Medication list given to you today.  *If you need a refill on your cardiac medications before your next appointment, please call your pharmacy*   Lab Work: Your physician recommends that you return for lab work in: Today    If you have labs (blood work) drawn today and your tests are completely normal, you will receive your results only by: Marland Kitchen MyChart Message (if you have MyChart) OR . A paper copy in the mail If you have any lab test that is abnormal or we need to change your treatment, we will call you to review the results.   Testing/Procedures: NONE   Follow-Up: At [facility], you and your health needs are our priority.  As part of our continuing mission to provide you with exceptional heart care, we have created designated Provider Care Teams.  These Care Teams include your primary Cardiologist (physician) and Advanced Practice Providers (APPs -  Physician Assistants and Nurse Practitioners) who all work together to provide you with the care you need, when you need it.  We recommend signing up for the patient portal called "MyChart".  Sign up information is provided on this After Visit Summary.  MyChart is used to connect with patients for Virtual Visits (Telemedicine).  Patients are able to view lab/test results, encounter notes, upcoming appointments, etc.  Non-urgent messages can be sent to your provider as well.   To learn more about what you can do with MyChart, go to [email or web address].    Your next appointment:   6 week(s)  The format for your next appointment:   In Person  Provider:   Carlyle Dolly, MD   Other Instructions Thank you for choosing [place]!       Sumner Boast, PA-C  06/07/2020 12:54 PM    [facility] [address], [place], [place]  [address] Phone: [phone]; Fax: [phone]

## 2020-06-07 ENCOUNTER — Other Ambulatory Visit (HOSPITAL_COMMUNITY)
Admission: RE | Admit: 2020-06-07 | Discharge: 2020-06-07 | Disposition: A | Discharge status: Home / Self Care | Payer: Medicare HMO | Source: Ambulatory Visit | Attending: Physician Assistant | Admitting: Physician Assistant

## 2020-06-07 ENCOUNTER — Ambulatory Visit (INDEPENDENT_AMBULATORY_CARE_PROVIDER_SITE_OTHER): Payer: Medicare HMO | Admitting: Physician Assistant

## 2020-06-07 ENCOUNTER — Other Ambulatory Visit: Payer: Self-pay

## 2020-06-07 ENCOUNTER — Encounter: Payer: Self-pay | Admitting: Physician Assistant

## 2020-06-07 VITALS — BP 133/73 | HR 80 | Ht 65.0 in | Wt 337.4 lb

## 2020-06-07 DIAGNOSIS — N183 Chronic kidney disease, stage 3 unspecified: Secondary | ICD-10-CM | POA: Diagnosis not present

## 2020-06-07 DIAGNOSIS — I5042 Chronic combined systolic (congestive) and diastolic (congestive) heart failure: Secondary | ICD-10-CM | POA: Diagnosis not present

## 2020-06-07 DIAGNOSIS — I10 Essential (primary) hypertension: Secondary | ICD-10-CM | POA: Insufficient documentation

## 2020-06-07 DIAGNOSIS — I2510 Atherosclerotic heart disease of native coronary artery without angina pectoris: Secondary | ICD-10-CM

## 2020-06-07 LAB — BASIC METABOLIC PANEL
Anion gap: 9 (ref 5–15)
BUN: 37 mg/dL — ABNORMAL HIGH (ref 8–23)
CO2: 24 mmol/L (ref 22–32)
Calcium: 8.4 mg/dL — ABNORMAL LOW (ref 8.9–10.3)
Chloride: 107 mmol/L (ref 98–111)
Creatinine, Ser: 1.43 mg/dL — ABNORMAL HIGH (ref 0.44–1.00)
GFR calc Af Amer: 45 mL/min — ABNORMAL LOW (ref >60)
GFR calc non Af Amer: 39 mL/min — ABNORMAL LOW (ref >60)
Glucose, Bld: 187 mg/dL — ABNORMAL HIGH (ref 70–99)
Potassium: 4.5 mmol/L (ref 3.5–5.1)
Sodium: 140 mmol/L (ref 135–145)

## 2020-06-07 NOTE — Patient Instructions (Signed)
Medication Instructions:  Your physician recommends that you continue on your current medications as directed. Please refer to the Current Medication list given to you today.  *If you need a refill on your cardiac medications before your next appointment, please call your pharmacy*   Lab Work: Your physician recommends that you return for lab work in: Today    If you have labs (blood work) drawn today and your tests are completely normal, you will receive your results only by: Marland Kitchen MyChart Message (if you have MyChart) OR . A paper copy in the mail If you have any lab test that is abnormal or we need to change your treatment, we will call you to review the results.   Testing/Procedures: NONE   Follow-Up: At [facility], you and your health needs are our priority.  As part of our continuing mission to provide you with exceptional heart care, we have created designated Provider Care Teams.  These Care Teams include your primary Cardiologist (physician) and Advanced Practice Providers (APPs -  Physician Assistants and Nurse Practitioners) who all work together to provide you with the care you need, when you need it.  We recommend signing up for the patient portal called "MyChart".  Sign up information is provided on this After Visit Summary.  MyChart is used to connect with patients for Virtual Visits (Telemedicine).  Patients are able to view lab/test results, encounter notes, upcoming appointments, etc.  Non-urgent messages can be sent to your provider as well.   To learn more about what you can do with MyChart, go to [email or web address].    Your next appointment:   6 week(s)  The format for your next appointment:   In Person  Provider:   Carlyle Dolly, MD   Other Instructions Thank you for choosing [facility]!

## 2020-06-08 ENCOUNTER — Telehealth: Payer: Self-pay | Admitting: *Deleted

## 2020-06-08 ENCOUNTER — Telehealth: Payer: Self-pay | Admitting: Physician Assistant

## 2020-06-08 DIAGNOSIS — Z79899 Other long term (current) drug therapy: Secondary | ICD-10-CM

## 2020-06-08 DIAGNOSIS — I5042 Chronic combined systolic (congestive) and diastolic (congestive) heart failure: Secondary | ICD-10-CM

## 2020-06-08 MED ORDER — ENTRESTO 97-103 MG PO TABS: 1.0000 | ORAL_TABLET | Freq: Two times a day (BID) | ORAL | 11 refills | Status: DC

## 2020-06-08 NOTE — Telephone Encounter (Signed)
-----   Message from [name], PA-C sent at 06/08/2020  1:28 PM EDT ----- Since her renal function is stable we will leave her on this dose. Recheck bmet in 3 weeks. thanks ----- Message ----- From: Levonne Hubert, LPN Sent: 08/31/7836  11:52 AM EDT To: [name], PA-C  Pt notified and states she is taking Entresto 97/103 mg

## 2020-06-08 NOTE — Telephone Encounter (Signed)
Pt states she is calling back as instructed to give mg of medications   Please call [phone]   Thanks renee

## 2020-06-08 NOTE — Telephone Encounter (Signed)
Pt notified and voiced understanding 

## 2020-06-08 NOTE — Telephone Encounter (Signed)
Patient takes 97/103 mg bid , will change in [facility]

## 2020-06-24 ENCOUNTER — Emergency Department (HOSPITAL_COMMUNITY)
Admission: EM | Admit: 2020-06-24 | Discharge: 2020-06-24 | Disposition: A | Discharge status: Home / Self Care | Payer: Medicare HMO | Attending: Emergency Medicine | Admitting: Emergency Medicine

## 2020-06-24 ENCOUNTER — Other Ambulatory Visit: Payer: Self-pay

## 2020-06-24 ENCOUNTER — Encounter (HOSPITAL_COMMUNITY): Payer: Self-pay | Admitting: *Deleted

## 2020-06-24 ENCOUNTER — Emergency Department (HOSPITAL_COMMUNITY): Payer: Medicare HMO

## 2020-06-24 DIAGNOSIS — E119 Type 2 diabetes mellitus without complications: Secondary | ICD-10-CM | POA: Insufficient documentation

## 2020-06-24 DIAGNOSIS — I5042 Chronic combined systolic (congestive) and diastolic (congestive) heart failure: Secondary | ICD-10-CM | POA: Insufficient documentation

## 2020-06-24 DIAGNOSIS — I110 Hypertensive heart disease with heart failure: Secondary | ICD-10-CM | POA: Insufficient documentation

## 2020-06-24 DIAGNOSIS — Z87891 Personal history of nicotine dependence: Secondary | ICD-10-CM | POA: Insufficient documentation

## 2020-06-24 DIAGNOSIS — Z794 Long term (current) use of insulin: Secondary | ICD-10-CM | POA: Insufficient documentation

## 2020-06-24 DIAGNOSIS — Z7982 Long term (current) use of aspirin: Secondary | ICD-10-CM | POA: Insufficient documentation

## 2020-06-24 DIAGNOSIS — Z7901 Long term (current) use of anticoagulants: Secondary | ICD-10-CM | POA: Insufficient documentation

## 2020-06-24 DIAGNOSIS — E039 Hypothyroidism, unspecified: Secondary | ICD-10-CM | POA: Diagnosis not present

## 2020-06-24 DIAGNOSIS — Z79899 Other long term (current) drug therapy: Secondary | ICD-10-CM | POA: Insufficient documentation

## 2020-06-24 DIAGNOSIS — R0789 Other chest pain: Secondary | ICD-10-CM | POA: Diagnosis not present

## 2020-06-24 DIAGNOSIS — I10 Essential (primary) hypertension: Secondary | ICD-10-CM | POA: Diagnosis not present

## 2020-06-24 DIAGNOSIS — I2510 Atherosclerotic heart disease of native coronary artery without angina pectoris: Secondary | ICD-10-CM | POA: Insufficient documentation

## 2020-06-24 DIAGNOSIS — R1013 Epigastric pain: Secondary | ICD-10-CM | POA: Diagnosis not present

## 2020-06-24 LAB — BASIC METABOLIC PANEL
Anion gap: 8 (ref 5–15)
BUN: 25 mg/dL — ABNORMAL HIGH (ref 8–23)
CO2: 26 mmol/L (ref 22–32)
Calcium: 8.5 mg/dL — ABNORMAL LOW (ref 8.9–10.3)
Chloride: 104 mmol/L (ref 98–111)
Creatinine, Ser: 1.11 mg/dL — ABNORMAL HIGH (ref 0.44–1.00)
GFR calc Af Amer: >60 mL/min (ref >60)
GFR calc non Af Amer: 53 mL/min — ABNORMAL LOW (ref >60)
Glucose, Bld: 195 mg/dL — ABNORMAL HIGH (ref 70–99)
Potassium: 4 mmol/L (ref 3.5–5.1)
Sodium: 138 mmol/L (ref 135–145)

## 2020-06-24 LAB — CBC
HCT: 35 % — ABNORMAL LOW (ref 36.0–46.0)
Hemoglobin: 10.8 g/dL — ABNORMAL LOW (ref 12.0–15.0)
MCH: 27.4 pg (ref 26.0–34.0)
MCHC: 30.9 g/dL (ref 30.0–36.0)
MCV: 88.8 fL (ref 80.0–100.0)
Platelets: 235 10*3/uL (ref 150–400)
RBC: 3.94 MIL/uL (ref 3.87–5.11)
RDW: 13.5 % (ref 11.5–15.5)
WBC: 8.3 10*3/uL (ref 4.0–10.5)
nRBC: 0 % (ref 0.0–0.2)

## 2020-06-24 LAB — TROPONIN I (HIGH SENSITIVITY)
Troponin I (High Sensitivity): 8 ng/L (ref <18)
Troponin I (High Sensitivity): 9 ng/L (ref <18)

## 2020-06-24 MED ORDER — PANTOPRAZOLE SODIUM 40 MG PO TBEC
40.0000 mg | DELAYED_RELEASE_TABLET | Freq: Once | ORAL | Status: AC
  Administered 2020-06-24: 40 mg via ORAL
  Filled 2020-06-24: qty 1

## 2020-06-24 MED ORDER — FAMOTIDINE 20 MG PO TABS: 20.0000 mg | ORAL_TABLET | Freq: Every day | ORAL | 0 refills | Status: DC

## 2020-06-24 MED ORDER — ALUM & MAG HYDROXIDE-SIMETH 200-200-20 MG/5ML PO SUSP
30.0000 mL | Freq: Once | ORAL | Status: AC
  Administered 2020-06-24: 30 mL via ORAL
  Filled 2020-06-24: qty 30

## 2020-06-24 MED ORDER — SUCRALFATE 1 GM/10ML PO SUSP
1.0000 g | Freq: Three times a day (TID) | ORAL | 0 refills | Status: DC
Start: 2020-06-24 — End: 2020-07-02

## 2020-06-24 MED ORDER — SODIUM CHLORIDE 0.9% FLUSH: 3.0000 mL | Freq: Once | INTRAVENOUS | Status: DC

## 2020-06-24 MED ORDER — PANTOPRAZOLE SODIUM 40 MG IV SOLR: 40.0000 mg | Freq: Once | INTRAVENOUS | Status: DC

## 2020-06-24 MED ORDER — LIDOCAINE VISCOUS HCL 2 % MT SOLN
15.0000 mL | Freq: Once | OROMUCOSAL | Status: AC
  Administered 2020-06-24: 15 mL via ORAL
  Filled 2020-06-24: qty 15

## 2020-06-24 MED ORDER — PANTOPRAZOLE SODIUM 20 MG PO TBEC
20.0000 mg | DELAYED_RELEASE_TABLET | Freq: Every day | ORAL | 0 refills | Status: DC
Start: 2020-06-24 — End: 2020-07-02

## 2020-06-24 NOTE — ED Triage Notes (Signed)
Epigastric pain and chest tightness for 4 days

## 2020-06-24 NOTE — Discharge Instructions (Signed)
At this time there does not appear to be the presence of an emergent medical condition, however there is always the potential for conditions to change. Please read and follow the below instructions.  Please return to the Emergency Department immediately for any new or worsening symptoms. Please be sure to follow up with your Primary Care Provider within one week regarding your visit today; please call their office to schedule an appointment even if you are feeling better for a follow-up visit. Please take the medications Pepcid, Protonix and Carafate as prescribed to help with your symptoms.  You may call the gastroenterologist Dr. Gala Romney on your discharge paperwork to schedule follow-up appointment for further evaluation of your acid reflux disease.    Get help right away if: Your chest pain is worse. You have a cough that gets worse, or you cough up blood. You have very bad (severe) pain in your belly (abdomen). You pass out (faint). You have either of these for no clear reason: Sudden chest discomfort. Sudden discomfort in your arms, back, neck, or jaw. You have shortness of breath at any time. You suddenly start to sweat, or your skin gets clammy. You feel sick to your stomach (nauseous). You throw up (vomit). You suddenly feel lightheaded or dizzy. You feel very weak or tired. Your heart starts to beat fast, or it feels like it is skipping beats. You have any new/concerning or worsening of symptoms These symptoms may be an emergency. Do not wait to see if the symptoms will go away. Get medical help right away. Call your local emergency services (911 in the [place] Do not drive yourself to the hospital.   Please read the additional information packets attached to your discharge summary.  Do not take your medicine if  develop an itchy rash, swelling in your mouth or lips, or difficulty breathing; call 911 and seek immediate emergency medical attention if this occurs.  You may review  your lab tests and imaging results in their entirety on your MyChart account.  Please discuss all results of fully with your primary care provider and other specialist at your follow-up visit.  Note: Portions of this text may have been transcribed using voice recognition software. Every effort was made to ensure accuracy; however, inadvertent computerized transcription errors may still be present.

## 2020-06-24 NOTE — ED Provider Notes (Signed)
[facility] Provider Note   CSN: [id number] Arrival date & time: 06/24/20  1420     History Chief Complaint  Patient presents with  . Chest Pain    Maureen Jackson is a 64 y.o. female history includes morbid obesity, CHF, CAD, diabetes, hypertension, LBBB, OSA, gastritis, hysterectomy.  Patient presents today for concern of epigastric pain that began 4 days ago.  Patient reports that Sunday morning she had a sausage and egg biscuit from [employer], shortly after she developed "heartburn".  She describes this pain as a burning/tight epigastric pain which will occasionally radiate up towards her throat, pain has been mild-moderate intensity and constant since onset 4 days ago, no clear aggravating factors.  She has been taking Tums intermittently for her pain with temporary relief.  She has not been taking any other medication to help with heartburn.  She denies fever/chills, cough/hemoptysis, shortness of breath, nausea/vomiting, diarrhea, extremity swelling/color change or any additional concerns.  HPI     Past Medical History:  Diagnosis Date  . 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 Bleeding from mouth    when she brushed teeth or ate  . Chronic systolic CHF (congestive heart failure) ([place])   . Coronary artery disease 09/2017   a. multivessel CAD by cath in 09/2017 and not felt to be a CABG candidate --> underwent two-vessel PCI with DES to the LAD and DES to the RCA  . Diabetes mellitus   . Dyspnea   . Glaucoma   . Hypertension   . Left bundle branch block   . Morbid obesity ([place])   . Obstructive sleep apnea    does not wear CPAP  . Thyroid disease     Patient Active Problem List   Diagnosis Date Noted  . Acute on chronic combined systolic (congestive) and diastolic (congestive) heart failure ([place]) 05/18/2020  . Acute respiratory disease due to COVID-19 virus 12/26/2019  . Pressure injury of skin 09/21/2019  . Drug rash   . Abscess   . Fistula   .  Fournier's gangrene in female   . Chest pain 07/16/2018  . Hyperlipidemia 04/10/2018  . TIA (transient ischemic attack) 03/14/2018  . Vertigo 03/14/2018  . Chronic combined systolic and diastolic CHF (congestive heart failure) (EF 30 to 35 %) 03/14/2018  . Ischemic cardiomyopathy 11/06/2017  . CAD, multiple vessel   . Pericardial effusion 09/07/2017  . Acute combined systolic (congestive) and diastolic (congestive) heart failure ([place]) 09/06/2017  . Cardiomegaly 09/05/2017  . Morbid obesity/BMI > 55 03/04/2013  . Labyrinthitis 03/04/2013  . Diabetes mellitus type 2 with complications, uncontrolled ([place]) 03/04/2013  . GASTRITIS 12/13/2006  . HIATAL HERNIA, HX OF 12/13/2006  . THYROIDECTOMY, HX OF 12/13/2006  . Hypothyroidism 10/04/2006  . TOBACCO ABUSE 10/04/2006  . CARPAL TUNNEL SYNDROME 10/04/2006  . Essential hypertension 10/04/2006  . GERD 10/04/2006  . POSTMENOPAUSAL STATUS 10/04/2006  . SKIN TAG 10/04/2006  . KNEE PAIN, LEFT 10/04/2006  . Sleep apnea 10/04/2006  . LEG EDEMA 10/04/2006    Past Surgical History:  Procedure Laterality Date  . ABDOMINAL HYSTERECTOMY    . CARDIAC CATHETERIZATION  09/12/2017  . CORONARY STENT INTERVENTION  09/12/2017   STENT RESOLUTE ONYX 6.96V89 drug eluting stent was successfully placed  . CORONARY STENT INTERVENTION N/A 09/12/2017   Procedure: CORONARY STENT INTERVENTION;  Surgeon: Leonie Man, MD;  Location: [facility];  Service: Cardiovascular;  Laterality: N/A;  . INCISION AND DRAINAGE PERIRECTAL ABSCESS Left  09/16/2019   Procedure: IRRIGATION AND DEBRIDEMENT LABIA ABSCESS;  Surgeon: Coralie Keens, MD;  Location: [facility];  Service: General;  Laterality: Left;  . IR FLUORO GUIDE CV LINE RIGHT  09/16/2019  . IR US GUIDE VASC ACCESS RIGHT  09/16/2019  . LEFT HEART CATH AND CORONARY ANGIOGRAPHY N/A 09/12/2017   Procedure: LEFT HEART CATH AND CORONARY ANGIOGRAPHY;  Surgeon: Leonie Man, MD;  Location: [facility];   Service: Cardiovascular;  Laterality: N/A;  . MASS EXCISION N/A 12/18/2018   Procedure: EXCISION TONGUE MASS;  Surgeon: Leta Baptist, MD;  Location: [facility];  Service: ENT;  Laterality: N/A;  . RIGHT/LEFT HEART CATH AND CORONARY ANGIOGRAPHY N/A 09/10/2017   Procedure: RIGHT/LEFT HEART CATH AND CORONARY ANGIOGRAPHY;  Surgeon: Troy Sine, MD;  Location: [facility];  Service: Cardiovascular;  Laterality: N/A;  . THYROID SURGERY       OB History   No obstetric history on file.     Family History  Problem Relation Age of Onset  . Diabetes Mother   . Hypertension Mother   . Cancer Mother        pancreas  . Hypertension Sister     Social History   Tobacco Use  . Smoking status: Former Smoker    Quit date: 12/14/2005    Years since quitting: 14.5  . Smokeless tobacco: Never Used  . Tobacco comment: QUIT IN 2005  Vaping Use  . Vaping Use: Never used  Substance Use Topics  . Alcohol use: No  . Drug use: No    Home Medications Prior to Admission medications   Medication Sig Start Date End Date Taking? Authorizing Provider  acetaminophen (TYLENOL) 325 MG tablet Take 2 tablets (650 mg total) by mouth every 4 (four) hours as needed for mild pain, fever or headache. 05/25/20  Yes Roxan Hockey, MD  aspirin EC 81 MG tablet Take 1 tablet (81 mg total) by mouth daily with breakfast. 05/25/20  Yes Emokpae, Courage, MD  atorvastatin (LIPITOR) 80 MG tablet Take 1 tablet (80 mg total) by mouth daily. 05/25/20  Yes Roxan Hockey, MD  carvedilol (COREG) 6.25 MG tablet Take 1 tablet (6.25 mg total) by mouth 2 (two) times daily with a meal. 05/25/20  Yes Emokpae, Courage, MD  cetirizine (ZYRTEC) 10 MG tablet Take 10 mg by mouth daily as needed for allergies.  09/12/18  Yes [provider]  clopidogrel (PLAVIX) 75 MG tablet TAKE 1 TABLET(75 MG) BY MOUTH DAILY WITH BREAKFAST Patient taking differently: Take 75 mg by mouth daily.  09/02/19  Yes Branch, Alphonse Guild, MD  furosemide (LASIX)  40 MG tablet Take 1 tablet (40 mg total) by mouth 2 (two) times daily. 05/25/20  Yes Roxan Hockey, MD  levothyroxine (SYNTHROID) 200 MCG tablet Take 1 tablet (200 mcg total) by mouth daily before breakfast. Take BEFORE Breakfast 05/26/20  Yes Emokpae, Courage, MD  NOVOLOG FLEXPEN 100 UNIT/ML FlexPen Inject 18 Units into the skin 3 (three) times daily with meals. 12/31/19  Yes Gherghe, Vella Redhead, MD  sacubitril-valsartan (ENTRESTO) 97-103 MG Take 1 tablet by mouth 2 (two) times daily. 06/08/20  Yes [name], PA-C  senna-docusate (SENOKOT-S) 8.6-50 MG tablet Take 2 tablets by mouth 2 (two) times daily. Patient taking differently: Take 2 tablets by mouth daily as needed for mild constipation or moderate constipation.  05/25/20  Yes Emokpae, Courage, MD  timolol (TIMOPTIC) 0.5 % ophthalmic solution Place 1 drop into both eyes 2 (two) times daily. 10/07/18  Yes [provider]  TRESIBA FLEXTOUCH 100 UNIT/ML SOPN FlexTouch Pen Inject 60 Units into the skin at bedtime.  10/08/19  Yes [provider]  VENTOLIN HFA 108 (90 Base) MCG/ACT inhaler Inhale 1-2 puffs into the lungs every 6 (six) hours as needed for wheezing or shortness of breath. 05/25/20  Yes Emokpae, Courage, MD  famotidine (PEPCID) 20 MG tablet Take 1 tablet (20 mg total) by mouth daily. 06/24/20   Nuala Alpha A, PA-C  pantoprazole (PROTONIX) 20 MG tablet Take 1 tablet (20 mg total) by mouth daily. 06/24/20   Nuala Alpha A, PA-C  sucralfate (CARAFATE) 1 GM/10ML suspension Take 10 mLs (1 g total) by mouth 4 (four) times daily -  with meals and at bedtime. 06/24/20   Nuala Alpha A, PA-C    Allergies    Ampicillin, Aspirin, Hydrocodone, and Unasyn [ampicillin-sulbactam sodium]  Review of Systems   Review of Systems Ten systems are reviewed and are negative for acute change except as noted in the HPI  Physical Exam Updated Vital Signs BP (!) 146/73 (BP Location: Left Arm)   Pulse 76   Temp 98.6 F (37 C)    Resp 16   Ht 5\' 5"  (1.651 m)   Wt (!) 152 kg   SpO2 97%   BMI 55.75 kg/m   Physical Exam Constitutional:      General: She is not in acute distress.    Appearance: Normal appearance. She is well-developed. She is not ill-appearing or diaphoretic.  HENT:     Head: Normocephalic and atraumatic.  Eyes:     General: Vision grossly intact. Gaze aligned appropriately.     Pupils: Pupils are equal, round, and reactive to light.  Neck:     Trachea: Trachea and phonation normal.  Cardiovascular:     Rate and Rhythm: Normal rate and regular rhythm.     Pulses:          Dorsalis pedis pulses are 2+ on the right side and 2+ on the left side.  Pulmonary:     Effort: Pulmonary effort is normal. No respiratory distress.     Breath sounds: Normal breath sounds.  Abdominal:     General: There is no distension.     Palpations: Abdomen is soft.     Tenderness: There is no abdominal tenderness. There is no guarding or rebound. Negative signs include Murphy's sign.  Musculoskeletal:        General: Normal range of motion.     Cervical back: Normal range of motion.     Right lower leg: No tenderness. No edema.     Left lower leg: No tenderness. No edema.  Skin:    General: Skin is warm and dry.  Neurological:     Mental Status: She is alert.     GCS: GCS eye subscore is 4. GCS verbal subscore is 5. GCS motor subscore is 6.     Comments: Speech is clear and goal oriented, follows commands Major Cranial nerves without deficit, no facial droop Moves extremities without ataxia, coordination intact  Psychiatric:        Behavior: Behavior normal.     ED Results / Procedures / Treatments   Labs (all labs ordered are listed, but only abnormal results are displayed) Labs Reviewed  BASIC METABOLIC PANEL - Abnormal; Notable for the following components:      Result Value   Glucose, Bld 195 (*)    BUN 25 (*)    Creatinine, Ser 1.11 (*)  Calcium 8.5 (*)    GFR calc non Af Amer 53 (*)     All other components within normal limits  CBC - Abnormal; Notable for the following components:   Hemoglobin 10.8 (*)    HCT 35.0 (*)    All other components within normal limits  TROPONIN I (HIGH SENSITIVITY)  TROPONIN I (HIGH SENSITIVITY)    EKG EKG Interpretation  Date/Time:  Thursday June 24 2020 15:34:58 EDT Ventricular Rate:  78 PR Interval:  146 QRS Duration: 156 QT Interval:  430 QTC Calculation: 490 R Axis:   90 Text Interpretation: Normal sinus rhythm Rightward axis Left bundle branch block Abnormal ECG No significant change since last tracing Confirmed by Fredia Sorrow [phone]) on 06/24/2020 3:45:30 PM   Radiology DG Chest 2 View  Result Date: 06/24/2020 CLINICAL DATA:  Epigastric pain and chest tightness for 4 days, previous tobacco abuse EXAM: CHEST - 2 VIEW COMPARISON:  05/17/2020 FINDINGS: Frontal and lateral views of the chest demonstrate marked enlargement of the cardiac silhouette. No airspace disease, effusion, or pneumothorax. No acute bony abnormalities. IMPRESSION: 1. Stable enlarged cardiac silhouette. 2. No acute intrathoracic process. Electronically Signed   By: Randa Ngo M.D.   On: 06/24/2020 16:02    Procedures Procedures (including critical care time)  Medications Ordered in ED Medications  sodium chloride flush (NS) 0.9 % injection 3 mL (has no administration in time range)  alum & mag hydroxide-simeth (MAALOX/MYLANTA) 200-200-20 MG/5ML suspension 30 mL (30 mLs Oral Given 06/24/20 1958)    And  lidocaine (XYLOCAINE) 2 % viscous mouth solution 15 mL (15 mLs Oral Given 06/24/20 1958)  pantoprazole (PROTONIX) EC tablet 40 mg (40 mg Oral Given 06/24/20 1958)    ED Course  I have reviewed the triage vital signs and the nursing notes.  Pertinent labs & imaging results that were available during my care of the patient were reviewed by me and considered in my medical decision making (see chart for details).    MDM Rules/Calculators/A&P                          Additional History Obtained from: 1. Nursing notes from this visit. 2. EMR, patient with admission on May 17, 2020 for acute pulmonary edema, shortness of breath, leg pain.  Patient reports she recovered well from that admission. --------------------------- I ordered, reviewed and interpreted labs which include: Initial and delta high-sensitivity troponins are within normal limits and without acute elevations. CBC shows no leukocytosis to suggest infection, hemoglobin 10.8 similar to prior BMP shows no emergent electrolyte derangement, kidney function appears slightly improved from baseline, no gap.  Chest x-ray:    IMPRESSION:  1. Stable enlarged cardiac silhouette.  2. No acute intrathoracic process.  I personally reviewed patient's chest x-ray and agree with radiologist interpretation above.  EKG: Normal sinus rhythm Rightward axis Left bundle branch block Abnormal ECG No significant change since last tracing Confirmed by Fredia Sorrow [id number]) on 06/24/2020 3:45:30 PM  Suspect reflux disease as etiology of patient's symptoms today, will start patient on Protonix and Carafate and referred to GI for follow-up along with PCP.  Patient does have history of cardiac disease however given history presentation and work-up today low suspicion for ACS as cause of her pain.  Additionally no shortness of breath, cough/hemoptysis, tachycardia, hypoxia or pleurisy doubt PE, Acute CHF, dissection or other emergent cardiopulmonary etiology of her symptoms at this time.  Additionally patient has no right upper quadrant  tenderness, n/v or abdominal tenderness to suggest cholecystitis, SBO, perforation or other intra-abdominal pathologies at this time.    At this time there does not appear to be any evidence of an acute emergency medical condition and the patient appears stable for discharge with appropriate outpatient follow up. Diagnosis was discussed with patient who verbalizes understanding  of care plan and is agreeable to discharge. I have discussed return precautions with patient who verbalizes understanding. Patient encouraged to follow-up with their PCP and GI. All questions answered.  Patient's case discussed with Dr. Rogene Houston who agrees with plan to discharge with follow-up.   Note: Portions of this report may have been transcribed using voice recognition software. Every effort was made to ensure accuracy; however, inadvertent computerized transcription errors may still be present. Final Clinical Impression(s) / ED Diagnoses Final diagnoses:  Atypical chest pain    Rx / DC Orders ED Discharge Orders         Ordered    pantoprazole (PROTONIX) 20 MG tablet  Daily     Discontinue  Reprint     06/24/20 2053    famotidine (PEPCID) 20 MG tablet  Daily     Discontinue  Reprint     06/24/20 2053    sucralfate (CARAFATE) 1 GM/10ML suspension  3 times daily with meals & bedtime     Discontinue  Reprint     06/24/20 2053           Deliah Boston, PA-C 06/24/20 2054    Fredia Sorrow, MD 06/25/20 [phone]

## 2020-06-26 ENCOUNTER — Other Ambulatory Visit: Payer: Self-pay | Admitting: Student

## 2020-06-30 ENCOUNTER — Other Ambulatory Visit (HOSPITAL_COMMUNITY)
Admission: RE | Admit: 2020-06-30 | Discharge: 2020-06-30 | Disposition: A | Discharge status: Home / Self Care | Payer: Medicare HMO | Source: Ambulatory Visit | Attending: Physician Assistant | Admitting: Physician Assistant

## 2020-06-30 ENCOUNTER — Other Ambulatory Visit: Payer: Self-pay

## 2020-06-30 ENCOUNTER — Other Ambulatory Visit: Payer: Self-pay | Admitting: *Deleted

## 2020-06-30 DIAGNOSIS — Z79899 Other long term (current) drug therapy: Secondary | ICD-10-CM | POA: Diagnosis not present

## 2020-06-30 DIAGNOSIS — I5042 Chronic combined systolic (congestive) and diastolic (congestive) heart failure: Secondary | ICD-10-CM | POA: Diagnosis not present

## 2020-06-30 LAB — BASIC METABOLIC PANEL
Anion gap: 10 (ref 5–15)
BUN: 25 mg/dL — ABNORMAL HIGH (ref 8–23)
CO2: 24 mmol/L (ref 22–32)
Calcium: 8.2 mg/dL — ABNORMAL LOW (ref 8.9–10.3)
Chloride: 106 mmol/L (ref 98–111)
Creatinine, Ser: 1.2 mg/dL — ABNORMAL HIGH (ref 0.44–1.00)
GFR calc Af Amer: 56 mL/min — ABNORMAL LOW (ref >60)
GFR calc non Af Amer: 48 mL/min — ABNORMAL LOW (ref >60)
Glucose, Bld: 133 mg/dL — ABNORMAL HIGH (ref 70–99)
Potassium: 4.1 mmol/L (ref 3.5–5.1)
Sodium: 140 mmol/L (ref 135–145)

## 2020-06-30 NOTE — Progress Notes (Signed)
Does home health visit her? It's not on her med list. She can either bring all her meds in that she's taking to verify or have [facility] check for [place]. Thanks

## 2020-06-30 NOTE — Progress Notes (Signed)
Pt called and requested that Imdur 60 mg be called in to pharmacy. Pt states that she has been taking daily. Pt denies chest pain and SOB. States that she ran out last week. Spoke with pharmacy and they state the last time Imdur was filled was in 2020. Imdur noted on 09/06/19 ED visit but not on 09/08/19 ED visit. Looks to be not taking according to med note on 08/20/18.  Please advise.

## 2020-07-01 NOTE — Progress Notes (Signed)
Nurse appt made for 07/02/20. Pt aware and voiced understanding.

## 2020-07-02 ENCOUNTER — Ambulatory Visit: Payer: Medicare HMO | Admitting: *Deleted

## 2020-07-02 ENCOUNTER — Other Ambulatory Visit: Payer: Self-pay

## 2020-07-02 VITALS — BP 136/80 | HR 76

## 2020-07-02 DIAGNOSIS — Z79899 Other long term (current) drug therapy: Secondary | ICD-10-CM

## 2020-07-02 NOTE — Progress Notes (Signed)
Pt in office to review meds. Pt states that she is no longer taking 60 mg Imdur. Pt is taking Norvasc 5 mg daily.

## 2020-07-12 DIAGNOSIS — R69 Illness, unspecified: Secondary | ICD-10-CM | POA: Diagnosis not present

## 2020-07-13 NOTE — Progress Notes (Signed)
Cardiology Office Note    Date:  07/19/2020   ID:  ROSSETTA KAMA, DOB Oct 20, 1956, MRN [id number]  PCP:  Neale Burly, MD  Cardiologist: Carlyle Dolly, MD EPS: None  No chief complaint on file.   History of Present Illness:  BRETTE CAST is a 64 y.o. female with past medical history of CAD (s/p DES to LAD and RCA in 09/2017), ischemic cardiomyopathy (EF 30-35% in 09/2017,  35-40% by repeat in 12/2018), HTN, HLD, Type 2 DM, Stage 4 CKD and obesity who presents for a 41-month follow-up telehealth visit.    She was admitted to [facility] in 12/2019 for acute hypoxic respiratory failure in the setting of COVID-19 PNA. She was treated with Remdesivir along with IV steroids. She was treated for acute on chronic CHF exacerbation but developed an AKI and hypotension with diuretic therapy, therefore Aldactone and Entresto were held at discharge.  Entresto was restarted by Dr. Harl Bowie 01/28/2020 24/26 mg twice daily.   Patient was discharged from hospital 05/25/2020 after being treated with acute pulmonary edema. Entresto and spironolactone were stopped due to rising creatinine but restarted at lower doses.  Repeat echo EF 30%.  Chronic lower extremity pain Dopplers negative for DVT 04/30/2020 and 05/18/2020.  I saw patient 06/07/20 and she was taking higher dose entresto 97/103mg  bid and Crt was stable. She was in ED 06/24/20 with epigastric pain after eating egg and sausage biscuit. Cardiac enzymes negative EKG unchanged.  Patient comes in today for follow-up.  She still has confusion over her medications and says she forgets to take them at times.  She has no one to help her.  She is not sure she has amlodipine anymore.  Overall heart failure is compensated.  She continues to lose weight and seems motivated to get under 300 pounds.   Past Medical History:  Diagnosis Date  . 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 Bleeding from mouth    when she brushed teeth or ate  . Chronic systolic CHF  (congestive heart failure) ([place])   . Coronary artery disease 09/2017   a. multivessel CAD by cath in 09/2017 and not felt to be a CABG candidate --> underwent two-vessel PCI with DES to the LAD and DES to the RCA  . Diabetes mellitus   . Dyspnea   . Glaucoma   . Hypertension   . Left bundle branch block   . Morbid obesity ([place])   . Obstructive sleep apnea    does not wear CPAP  . Thyroid disease     Past Surgical History:  Procedure Laterality Date  . ABDOMINAL HYSTERECTOMY    . CARDIAC CATHETERIZATION  09/12/2017  . CORONARY STENT INTERVENTION  09/12/2017   STENT RESOLUTE ONYX 1.01B51 drug eluting stent was successfully placed  . CORONARY STENT INTERVENTION N/A 09/12/2017   Procedure: CORONARY STENT INTERVENTION;  Surgeon: Leonie Man, MD;  Location: [facility];  Service: Cardiovascular;  Laterality: N/A;  . INCISION AND DRAINAGE PERIRECTAL ABSCESS Left 09/16/2019   Procedure: IRRIGATION AND DEBRIDEMENT LABIA ABSCESS;  Surgeon: Coralie Keens, MD;  Location: [facility];  Service: General;  Laterality: Left;  . IR FLUORO GUIDE CV LINE RIGHT  09/16/2019  . IR US GUIDE VASC ACCESS RIGHT  09/16/2019  . LEFT HEART CATH AND CORONARY ANGIOGRAPHY N/A 09/12/2017   Procedure: LEFT HEART CATH AND CORONARY ANGIOGRAPHY;  Surgeon: Leonie Man, MD;  Location: [place] [facility];  Service: Cardiovascular;  Laterality:  N/A;  . MASS EXCISION N/A 12/18/2018   Procedure: EXCISION TONGUE MASS;  Surgeon: Leta Baptist, MD;  Location: [facility];  Service: ENT;  Laterality: N/A;  . RIGHT/LEFT HEART CATH AND CORONARY ANGIOGRAPHY N/A 09/10/2017   Procedure: RIGHT/LEFT HEART CATH AND CORONARY ANGIOGRAPHY;  Surgeon: Troy Sine, MD;  Location: [facility];  Service: Cardiovascular;  Laterality: N/A;  . THYROID SURGERY      Current Medications: Current Meds  Medication Sig  . acetaminophen (TYLENOL) 325 MG tablet Take 2 tablets (650 mg total) by mouth every 4 (four) hours as needed for mild pain,  fever or headache.  Marland Kitchen aspirin EC 81 MG tablet Take 1 tablet (81 mg total) by mouth daily with breakfast.  . atorvastatin (LIPITOR) 80 MG tablet Take 1 tablet (80 mg total) by mouth daily.  . calcium carbonate (TUMS - DOSED IN MG ELEMENTAL CALCIUM) 500 MG chewable tablet Chew 4 tablets by mouth daily.  . carvedilol (COREG) 6.25 MG tablet Take 1 tablet (6.25 mg total) by mouth 2 (two) times daily with a meal.  . cetirizine (ZYRTEC) 10 MG tablet Take 10 mg by mouth daily as needed for allergies.   Marland Kitchen clopidogrel (PLAVIX) 75 MG tablet TAKE 1 TABLET(75 MG) BY MOUTH DAILY WITH BREAKFAST (Patient taking differently: Take 75 mg by mouth daily. )  . furosemide (LASIX) 40 MG tablet Take 1 tablet (40 mg total) by mouth 2 (two) times daily.  Marland Kitchen levothyroxine (SYNTHROID) 200 MCG tablet Take 1 tablet (200 mcg total) by mouth daily before breakfast. Take BEFORE Breakfast  . NOVOLOG FLEXPEN 100 UNIT/ML FlexPen Inject 18 Units into the skin 3 (three) times daily with meals.  . sacubitril-valsartan (ENTRESTO) 97-103 MG Take 1 tablet by mouth 2 (two) times daily.  Marland Kitchen senna-docusate (SENOKOT-S) 8.6-50 MG tablet Take 2 tablets by mouth 2 (two) times daily. (Patient taking differently: Take 2 tablets by mouth daily as needed for mild constipation or moderate constipation. )  . timolol (TIMOPTIC) 0.5 % ophthalmic solution Place 1 drop into both eyes 2 (two) times daily.  Tyler Aas FLEXTOUCH 100 UNIT/ML SOPN FlexTouch Pen Inject 60 Units into the skin at bedtime.   . VENTOLIN HFA 108 (90 Base) MCG/ACT inhaler Inhale 1-2 puffs into the lungs every 6 (six) hours as needed for wheezing or shortness of breath.     Allergies:   Ampicillin, Aspirin, Hydrocodone, and Unasyn [ampicillin-sulbactam sodium]   Social History   Socioeconomic History  . Marital status: Widowed    Spouse name: Not on file  . Number of children: Not on file  . Years of education: Not on file  . Highest education level: Not on file  Occupational  History  . 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 Smoking status: Former Smoker    Quit date: 12/14/2005    Years since quitting: 14.6  . Smokeless tobacco: Never Used  . Tobacco comment: QUIT IN 2005  Vaping Use  . Vaping Use: Never used  Substance and Sexual Activity  . Alcohol use: No  . Drug use: No  . Sexual activity: Never  Other Topics Concern  . Not on file  Social History Narrative  . Not on file   Social Determinants of Health   Financial Resource Strain:   . Difficulty of Paying Living Expenses:   Food Insecurity: No Food Insecurity  . Worried About Charity fundraiser in the Last Year: Never true  . Ran Out  of Food in the Last Year: Never true  Transportation Needs: Unmet Transportation Needs  . Lack of Transportation (Medical): Yes  . Lack of Transportation (Non-Medical): No  Physical Activity:   . Days of Exercise per Week:   . Minutes of Exercise per Session:   Stress:   . Feeling of Stress :   Social Connections:   . Frequency of Communication with Friends and Family:   . Frequency of Social Gatherings with Friends and Family:   . Attends Religious Services:   . Active Member of Clubs or Organizations:   . Attends Archivist Meetings:   Marland Kitchen Marital Status:      Family History:  The patient's   family history includes Cancer in her mother; Diabetes in her mother; Hypertension in her mother and sister.   ROS:   Please see the history of present illness.    ROS All other systems reviewed and are negative.   PHYSICAL EXAM:   VS:  BP (!) 148/78   Pulse 92   Ht 5\' 5"  (1.651 m)   Wt (!) 332 lb (150.6 kg)   SpO2 95%   BMI 55.25 kg/m   Physical Exam  LNL:GXQJJ, in no acute distress  Neck: no JVD, carotid bruits, or masses Cardiac:RRR; no murmurs, rubs, or gallops  Respiratory:  clear to auscultation bilaterally, normal work of breathing GI: soft, nontender, nondistended, + BS Ext: Trace of edema without  cyanosis, clubbing,  Good distal pulses bilaterally Neuro:  Alert and Oriented x 3, Psych: euthymic mood, full affect  Wt Readings from Last 3 Encounters:  07/19/20 (!) 332 lb (150.6 kg)  06/24/20 (!) 335 lb (152 kg)  06/07/20 (!) 337 lb 6.4 oz (153 kg)      Studies/Labs Reviewed:   EKG:  EKG is not ordered today.    Recent Labs: 12/31/2019: ALT 27 05/17/2020: B Natriuretic Peptide 132.0 05/23/2020: Magnesium 2.3; TSH 14.385 06/24/2020: Hemoglobin 10.8; Platelets 235 06/30/2020: BUN 25; Creatinine, Ser 1.20; Potassium 4.1; Sodium 140   Lipid Panel    Component Value Date/Time   CHOL 147 07/17/2018 0152   TRIG 124 12/26/2019 1546   HDL 31 (L) 07/17/2018 0152   CHOLHDL 4.7 07/17/2018 0152   VLDL 33 07/17/2018 0152   LDLCALC 83 07/17/2018 0152    Additional studies/ records that were reviewed today include:  Echo 05/18/2020 IMPRESSIONS     1. Images are limited. Suggest Definity contrast when adequate IV access  available.   2. Left ventricular ejection fraction, by estimation, is approximately  30%. The left ventricle has severely decreased function. Left ventricular  endocardial border not optimally defined to evaluate regional wall motion.  There is moderate left  ventricular hypertrophy. Left ventricular diastolic parameters are  indeterminate.   3. Right ventricular systolic function is normal. The right ventricular  size is normal.   4. Moderate pericardial effusion. The pericardial effusion is  circumferential.   5. The mitral valve is grossly normal. Trivial mitral valve  regurgitation.   6. The aortic valve is tricuspid. Aortic valve regurgitation is not  visualized.   7. The inferior vena cava is normal in size with greater than 50%  respiratory variability, suggesting right atrial pressure of 3 mmHg.  Echocardiogram: 12/2018 Study Conclusions   - Limited study to evaluate LV function.  - Technically difficult study, poor visualization. Technical issues     with attempts at echo contrast injections, inadequate contrast    effect on imaging despite multiple doses.  -  LVEF appears grossly in the 35-40% range.       ASSESSMENT:    1. Medication management   2. Chronic combined systolic and diastolic CHF (congestive heart failure) ([place])   3. Coronary artery disease involving native coronary artery of native heart without angina pectoris   4. Stage 3 chronic kidney disease, unspecified whether stage 3a or 3b CKD   5. Essential hypertension      PLAN:  In order of problems listed above:   Chronic combined heart failure  Echocardiogram 05/18/2020 LVEF approximately 30% with very limited images,   patient was taking the highest dose 97/103.  Crt 1.2 06/30/20.  Patient still has a lot of confusion over her medications.  Will ask for home health to assist her with possibly a pharmacy bubble packs and medication management.    CAD status post DES to the LAD and RCA in 2018.    No angina    CKD stage II-III.  Creatinine 1.27/22 1/21    Essential hypertension stable               Medication Adjustments/Labs and Tests Ordered: Current medicines are reviewed at length with the patient today.  Concerns regarding medicines are outlined above.  Medication changes, Labs and Tests ordered today are listed in the Patient Instructions below. Patient Instructions  Medication Instructions:  Your physician recommends that you continue on your current medications as directed. Please refer to the Current Medication list given to you today.  *If you need a refill on your cardiac medications before your next appointment, please call your pharmacy*   Lab Work: NONE   If you have labs (blood work) drawn today and your tests are completely normal, you will receive your results only by: Marland Kitchen MyChart Message (if you have MyChart) OR . A paper copy in the mail If you have any lab test that is abnormal or we need to change your treatment, we will call you to  review the results.   Testing/Procedures: NONE    Follow-Up: At [facility], you and your health needs are our priority.  As part of our continuing mission to provide you with exceptional heart care, we have created designated Provider Care Teams.  These Care Teams include your primary Cardiologist (physician) and Advanced Practice Providers (APPs -  Physician Assistants and Nurse Practitioners) who all work together to provide you with the care you need, when you need it.  We recommend signing up for the patient portal called "MyChart".  Sign up information is provided on this After Visit Summary.  MyChart is used to connect with patients for Virtual Visits (Telemedicine).  Patients are able to view lab/test results, encounter notes, upcoming appointments, etc.  Non-urgent messages can be sent to your provider as well.   To learn more about what you can do with MyChart, go to [email or web address].    Your next appointment:   2 month(s)  The format for your next appointment:   In Person  Provider:   Carlyle Dolly, MD   Other Instructions Thank you for choosing [facility]!       Sumner Boast, PA-C  07/19/2020 12:45 PM    [facility] [place], [place], [place]  [address] Phone: [phone]; Fax: [phone]

## 2020-07-19 ENCOUNTER — Ambulatory Visit (INDEPENDENT_AMBULATORY_CARE_PROVIDER_SITE_OTHER): Payer: Medicare HMO | Admitting: Physician Assistant

## 2020-07-19 ENCOUNTER — Encounter: Payer: Self-pay | Admitting: Physician Assistant

## 2020-07-19 ENCOUNTER — Other Ambulatory Visit: Payer: Self-pay

## 2020-07-19 VITALS — BP 148/78 | HR 92 | Ht 65.0 in | Wt 332.0 lb

## 2020-07-19 DIAGNOSIS — I10 Essential (primary) hypertension: Secondary | ICD-10-CM | POA: Diagnosis not present

## 2020-07-19 DIAGNOSIS — N183 Chronic kidney disease, stage 3 unspecified: Secondary | ICD-10-CM | POA: Diagnosis not present

## 2020-07-19 DIAGNOSIS — I2510 Atherosclerotic heart disease of native coronary artery without angina pectoris: Secondary | ICD-10-CM

## 2020-07-19 DIAGNOSIS — Z79899 Other long term (current) drug therapy: Secondary | ICD-10-CM

## 2020-07-19 DIAGNOSIS — I5042 Chronic combined systolic (congestive) and diastolic (congestive) heart failure: Secondary | ICD-10-CM

## 2020-07-19 MED ORDER — AMLODIPINE BESYLATE 5 MG PO TABS: 5.0000 mg | ORAL_TABLET | Freq: Every day | ORAL | 3 refills | Status: DC

## 2020-07-19 NOTE — Patient Instructions (Signed)
Medication Instructions:  Your physician recommends that you continue on your current medications as directed. Please refer to the Current Medication list given to you today.  *If you need a refill on your cardiac medications before your next appointment, please call your pharmacy*   Lab Work: NONE   If you have labs (blood work) drawn today and your tests are completely normal, you will receive your results only by: Marland Kitchen MyChart Message (if you have MyChart) OR . A paper copy in the mail If you have any lab test that is abnormal or we need to change your treatment, we will call you to review the results.   Testing/Procedures: NONE    Follow-Up: At [facility], you and your health needs are our priority.  As part of our continuing mission to provide you with exceptional heart care, we have created designated Provider Care Teams.  These Care Teams include your primary Cardiologist (physician) and Advanced Practice Providers (APPs -  Physician Assistants and Nurse Practitioners) who all work together to provide you with the care you need, when you need it.  We recommend signing up for the patient portal called "MyChart".  Sign up information is provided on this After Visit Summary.  MyChart is used to connect with patients for Virtual Visits (Telemedicine).  Patients are able to view lab/test results, encounter notes, upcoming appointments, etc.  Non-urgent messages can be sent to your provider as well.   To learn more about what you can do with MyChart, go to [email or web address].    Your next appointment:   2 month(s)  The format for your next appointment:   In Person  Provider:   Carlyle Dolly, MD   Other Instructions Thank you for choosing [facility]!

## 2020-08-09 DIAGNOSIS — I872 Venous insufficiency (chronic) (peripheral): Secondary | ICD-10-CM | POA: Diagnosis not present

## 2020-08-09 DIAGNOSIS — Z6841 Body Mass Index (BMI) 40.0 and over, adult: Secondary | ICD-10-CM | POA: Diagnosis not present

## 2020-08-09 DIAGNOSIS — E1121 Type 2 diabetes mellitus with diabetic nephropathy: Secondary | ICD-10-CM | POA: Diagnosis not present

## 2020-08-09 DIAGNOSIS — I5022 Chronic systolic (congestive) heart failure: Secondary | ICD-10-CM | POA: Diagnosis not present

## 2020-08-09 DIAGNOSIS — Z1331 Encounter for screening for depression: Secondary | ICD-10-CM | POA: Diagnosis not present

## 2020-08-09 DIAGNOSIS — I10 Essential (primary) hypertension: Secondary | ICD-10-CM | POA: Diagnosis not present

## 2020-08-09 DIAGNOSIS — E038 Other specified hypothyroidism: Secondary | ICD-10-CM | POA: Diagnosis not present

## 2020-08-09 DIAGNOSIS — Z0000 Encounter for general adult medical examination without abnormal findings: Secondary | ICD-10-CM | POA: Diagnosis not present

## 2020-09-15 DIAGNOSIS — R69 Illness, unspecified: Secondary | ICD-10-CM | POA: Diagnosis not present

## 2020-09-22 ENCOUNTER — Other Ambulatory Visit: Payer: Self-pay | Admitting: Cardiology

## 2020-10-04 ENCOUNTER — Other Ambulatory Visit: Payer: Self-pay

## 2020-10-04 ENCOUNTER — Encounter (HOSPITAL_COMMUNITY): Payer: Self-pay | Admitting: Emergency Medicine

## 2020-10-04 ENCOUNTER — Emergency Department (HOSPITAL_COMMUNITY)
Admission: EM | Admit: 2020-10-04 | Discharge: 2020-10-04 | Disposition: A | Discharge status: Home / Self Care | Payer: Medicare HMO | Attending: Emergency Medicine | Admitting: Emergency Medicine

## 2020-10-04 DIAGNOSIS — Z794 Long term (current) use of insulin: Secondary | ICD-10-CM | POA: Insufficient documentation

## 2020-10-04 DIAGNOSIS — I5043 Acute on chronic combined systolic (congestive) and diastolic (congestive) heart failure: Secondary | ICD-10-CM | POA: Insufficient documentation

## 2020-10-04 DIAGNOSIS — E039 Hypothyroidism, unspecified: Secondary | ICD-10-CM | POA: Diagnosis not present

## 2020-10-04 DIAGNOSIS — Z23 Encounter for immunization: Secondary | ICD-10-CM | POA: Insufficient documentation

## 2020-10-04 DIAGNOSIS — I10 Essential (primary) hypertension: Secondary | ICD-10-CM

## 2020-10-04 DIAGNOSIS — Z79899 Other long term (current) drug therapy: Secondary | ICD-10-CM | POA: Diagnosis not present

## 2020-10-04 DIAGNOSIS — Z87891 Personal history of nicotine dependence: Secondary | ICD-10-CM | POA: Insufficient documentation

## 2020-10-04 DIAGNOSIS — Z7902 Long term (current) use of antithrombotics/antiplatelets: Secondary | ICD-10-CM | POA: Insufficient documentation

## 2020-10-04 DIAGNOSIS — L02212 Cutaneous abscess of back [any part, except buttock]: Secondary | ICD-10-CM | POA: Diagnosis present

## 2020-10-04 DIAGNOSIS — E119 Type 2 diabetes mellitus without complications: Secondary | ICD-10-CM | POA: Diagnosis not present

## 2020-10-04 DIAGNOSIS — Z7982 Long term (current) use of aspirin: Secondary | ICD-10-CM | POA: Insufficient documentation

## 2020-10-04 DIAGNOSIS — I110 Hypertensive heart disease with heart failure: Secondary | ICD-10-CM | POA: Insufficient documentation

## 2020-10-04 DIAGNOSIS — L723 Sebaceous cyst: Secondary | ICD-10-CM | POA: Diagnosis not present

## 2020-10-04 DIAGNOSIS — I2510 Atherosclerotic heart disease of native coronary artery without angina pectoris: Secondary | ICD-10-CM | POA: Insufficient documentation

## 2020-10-04 DIAGNOSIS — L089 Local infection of the skin and subcutaneous tissue, unspecified: Secondary | ICD-10-CM

## 2020-10-04 MED ORDER — LIDOCAINE-EPINEPHRINE (PF) 2 %-1:200000 IJ SOLN
10.0000 mL | Freq: Once | INTRAMUSCULAR | Status: AC
  Administered 2020-10-04: 10 mL
  Filled 2020-10-04: qty 10

## 2020-10-04 MED ORDER — DOXYCYCLINE HYCLATE 100 MG PO CAPS
100.0000 mg | ORAL_CAPSULE | Freq: Two times a day (BID) | ORAL | 0 refills | Status: DC
Start: 2020-10-04 — End: 2022-02-09

## 2020-10-04 MED ORDER — DOXYCYCLINE HYCLATE 100 MG PO TABS
100.0000 mg | ORAL_TABLET | Freq: Once | ORAL | Status: AC
  Administered 2020-10-04: 100 mg via ORAL
  Filled 2020-10-04: qty 1

## 2020-10-04 MED ORDER — TETANUS-DIPHTH-ACELL PERTUSSIS 5-2.5-18.5 LF-MCG/0.5 IM SUSY
0.5000 mL | PREFILLED_SYRINGE | Freq: Once | INTRAMUSCULAR | Status: AC
  Administered 2020-10-04: 0.5 mL via INTRAMUSCULAR
  Filled 2020-10-04: qty 0.5

## 2020-10-04 NOTE — ED Provider Notes (Signed)
[facility] EMERGENCY DEPARTMENT Provider Note   CSN: [id number] Arrival date & time: 10/04/20  4098     History Chief Complaint  Patient presents with  . Abscess    Maureen Jackson is a 64 y.o. female with past medical history of CAD, morbid obesity, CHF, hypertension, OSA noncompliant with CPAP, history of Fournier's gangrene, TIA, pericardial effusion, who presents today for evaluation of pain and swelling in her back.  She reports that 3 days ago she started getting an area in her left-sided back that was very itchy and developed into pain.  She states that her family member looked at it and saw there was a black dot with a area of swelling around it.  Patient denies any fevers.  She reports compliance with her medications.  She states that otherwise she feels well.  When she takes Tylenol that alleviates her pain until the Tylenol wears off.    HPI     Past Medical History:  Diagnosis Date  . 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 Bleeding from mouth    when she brushed teeth or ate  . Chronic systolic CHF (congestive heart failure) ([place])   . Coronary artery disease 09/2017   a. multivessel CAD by cath in 09/2017 and not felt to be a CABG candidate --> underwent two-vessel PCI with DES to the LAD and DES to the RCA  . Diabetes mellitus   . Dyspnea   . Glaucoma   . Hypertension   . Left bundle branch block   . Morbid obesity ([place])   . Obstructive sleep apnea    does not wear CPAP  . Thyroid disease     Patient Active Problem List   Diagnosis Date Noted  . Acute on chronic combined systolic (congestive) and diastolic (congestive) heart failure ([place]) 05/18/2020  . Acute respiratory disease due to COVID-19 virus 12/26/2019  . Pressure injury of skin 09/21/2019  . Drug rash   . Abscess   . Fistula   . Fournier's gangrene in female   . Chest pain 07/16/2018  . Hyperlipidemia 04/10/2018  . TIA (transient ischemic attack) 03/14/2018  . Vertigo 03/14/2018  . Chronic combined  systolic and diastolic CHF (congestive heart failure) (EF 30 to 35 %) 03/14/2018  . Ischemic cardiomyopathy 11/06/2017  . CAD, multiple vessel   . Pericardial effusion 09/07/2017  . Acute combined systolic (congestive) and diastolic (congestive) heart failure ([place]) 09/06/2017  . Cardiomegaly 09/05/2017  . Morbid obesity/BMI > 55 03/04/2013  . Labyrinthitis 03/04/2013  . Diabetes mellitus type 2 with complications, uncontrolled ([place]) 03/04/2013  . GASTRITIS 12/13/2006  . HIATAL HERNIA, HX OF 12/13/2006  . THYROIDECTOMY, HX OF 12/13/2006  . Hypothyroidism 10/04/2006  . TOBACCO ABUSE 10/04/2006  . CARPAL TUNNEL SYNDROME 10/04/2006  . Essential hypertension 10/04/2006  . GERD 10/04/2006  . POSTMENOPAUSAL STATUS 10/04/2006  . SKIN TAG 10/04/2006  . KNEE PAIN, LEFT 10/04/2006  . Sleep apnea 10/04/2006  . LEG EDEMA 10/04/2006    Past Surgical History:  Procedure Laterality Date  . ABDOMINAL HYSTERECTOMY    . CARDIAC CATHETERIZATION  09/12/2017  . CORONARY STENT INTERVENTION  09/12/2017   STENT RESOLUTE ONYX 1.19J47 drug eluting stent was successfully placed  . CORONARY STENT INTERVENTION N/A 09/12/2017   Procedure: CORONARY STENT INTERVENTION;  Surgeon: Leonie Man, MD;  Location: [place] [facility];  Service: Cardiovascular;  Laterality: N/A;  . INCISION AND DRAINAGE PERIRECTAL ABSCESS Left 09/16/2019   Procedure: IRRIGATION AND DEBRIDEMENT  LABIA ABSCESS;  Surgeon: Coralie Keens, MD;  Location: [place];  Service: General;  Laterality: Left;  . IR FLUORO GUIDE CV LINE RIGHT  09/16/2019  . IR US GUIDE VASC ACCESS RIGHT  09/16/2019  . LEFT HEART CATH AND CORONARY ANGIOGRAPHY N/A 09/12/2017   Procedure: LEFT HEART CATH AND CORONARY ANGIOGRAPHY;  Surgeon: Leonie Man, MD;  Location: [place] [facility];  Service: Cardiovascular;  Laterality: N/A;  . MASS EXCISION N/A 12/18/2018   Procedure: EXCISION TONGUE MASS;  Surgeon: Leta Baptist, MD;  Location: [place] [facility];  Service: ENT;  Laterality:  N/A;  . RIGHT/LEFT HEART CATH AND CORONARY ANGIOGRAPHY N/A 09/10/2017   Procedure: RIGHT/LEFT HEART CATH AND CORONARY ANGIOGRAPHY;  Surgeon: Troy Sine, MD;  Location: [place] [facility];  Service: Cardiovascular;  Laterality: N/A;  . THYROID SURGERY       OB History   No obstetric history on file.     Family History  Problem Relation Age of Onset  . Diabetes Mother   . Hypertension Mother   . Cancer Mother        pancreas  . Hypertension Sister     Social History   Tobacco Use  . Smoking status: Former Smoker    Quit date: 12/14/2005    Years since quitting: 14.8  . Smokeless tobacco: Never Used  . Tobacco comment: QUIT IN 2005  Vaping Use  . Vaping Use: Never used  Substance Use Topics  . Alcohol use: No  . Drug use: No    Home Medications Prior to Admission medications   Medication Sig Start Date End Date Taking? Authorizing Provider  acetaminophen (TYLENOL) 325 MG tablet Take 2 tablets (650 mg total) by mouth every 4 (four) hours as needed for mild pain, fever or headache. 05/25/20   Denton Brick, Courage, MD  amLODipine (NORVASC) 5 MG tablet Take 1 tablet (5 mg total) by mouth daily. 07/19/20   [name], PA-C  aspirin EC 81 MG tablet Take 1 tablet (81 mg total) by mouth daily with breakfast. 05/25/20   Roxan Hockey, MD  atorvastatin (LIPITOR) 80 MG tablet Take 1 tablet (80 mg total) by mouth daily. 05/25/20   Roxan Hockey, MD  calcium carbonate (TUMS - DOSED IN MG ELEMENTAL CALCIUM) 500 MG chewable tablet Chew 4 tablets by mouth daily.    [provider]  carvedilol (COREG) 6.25 MG tablet Take 1 tablet (6.25 mg total) by mouth 2 (two) times daily with a meal. 05/25/20   Emokpae, Courage, MD  cetirizine (ZYRTEC) 10 MG tablet Take 10 mg by mouth daily as needed for allergies.  09/12/18   [provider]  clopidogrel (PLAVIX) 75 MG tablet Take 1 tablet (75 mg total) by mouth daily. 09/22/20   Arnoldo Lenis, MD  doxycycline (VIBRAMYCIN) 100  MG capsule Take 1 capsule (100 mg total) by mouth 2 (two) times daily. 10/04/20   Lorin Glass, PA-C  furosemide (LASIX) 40 MG tablet Take 1 tablet (40 mg total) by mouth 2 (two) times daily. 05/25/20   Roxan Hockey, MD  levothyroxine (SYNTHROID) 200 MCG tablet Take 1 tablet (200 mcg total) by mouth daily before breakfast. Take BEFORE Breakfast 05/26/20   Emokpae, Courage, MD  NOVOLOG FLEXPEN 100 UNIT/ML FlexPen Inject 18 Units into the skin 3 (three) times daily with meals. 12/31/19   Caren Griffins, MD  sacubitril-valsartan (ENTRESTO) 97-103 MG Take 1 tablet by mouth 2 (two) times daily. 06/08/20   [name], PA-C  senna-docusate (SENOKOT-S) 8.6-50 MG tablet Take 2 tablets by mouth 2 (two) times daily. Patient taking differently: Take 2 tablets by mouth daily as needed for mild constipation or moderate constipation.  05/25/20   Roxan Hockey, MD  timolol (TIMOPTIC) 0.5 % ophthalmic solution Place 1 drop into both eyes 2 (two) times daily. 10/07/18   [provider]  TRESIBA FLEXTOUCH 100 UNIT/ML SOPN FlexTouch Pen Inject 60 Units into the skin at bedtime.  10/08/19   [provider]  VENTOLIN HFA 108 (90 Base) MCG/ACT inhaler Inhale 1-2 puffs into the lungs every 6 (six) hours as needed for wheezing or shortness of breath. 05/25/20   Roxan Hockey, MD    Allergies    Ampicillin, Aspirin, Hydrocodone, and Unasyn [ampicillin-sulbactam sodium]  Review of Systems   Review of Systems  Constitutional: Negative for chills, fatigue and fever.  Cardiovascular: Negative for chest pain.  Gastrointestinal: Negative for abdominal pain and nausea.  Musculoskeletal: Positive for back pain. Negative for neck pain.  Skin:       Pain in back on skin  Psychiatric/Behavioral: Negative for confusion.  All other systems reviewed and are negative.   Physical Exam Updated Vital Signs BP (!) 187/76   Pulse 88   Temp 98.8 F (37.1 C)   Resp 18   Ht 5\' 5"  (1.651 m)    Wt (!) 152 kg   SpO2 97%   BMI 55.75 kg/m   Physical Exam Vitals and nursing note reviewed.  Constitutional:      General: She is not in acute distress.    Appearance: She is well-developed. She is obese. She is not diaphoretic.  HENT:     Head: Normocephalic and atraumatic.  Eyes:     General: No scleral icterus.       Right eye: No discharge.        Left eye: No discharge.     Conjunctiva/sclera: Conjunctivae normal.  Cardiovascular:     Rate and Rhythm: Normal rate and regular rhythm.  Pulmonary:     Effort: Pulmonary effort is normal. No respiratory distress.     Breath sounds: No stridor.  Abdominal:     General: There is no distension.  Musculoskeletal:        General: No deformity.     Cervical back: Normal range of motion.  Skin:    General: Skin is warm and dry.     Comments: And the left-sided lower back there is a approximate 7 cm x 8 area of firmness with erythema.  There is a centralized large blackhead appearing lesion concerning for a infected cyst.  Neurological:     General: No focal deficit present.     Mental Status: She is alert.     Motor: No abnormal muscle tone.  Psychiatric:        Mood and Affect: Mood normal.        Behavior: Behavior normal.     ED Results / Procedures / Treatments   Labs (all labs ordered are listed, but only abnormal results are displayed) Labs Reviewed - No data to display  EKG None  Radiology No results found.  Procedures .Marland KitchenIncision and Drainage  Date/Time: 10/05/2020 12:00 AM Performed by: Lorin Glass, PA-C Authorized by: Lorin Glass, PA-C   Consent:    Consent obtained:  Verbal   Consent given by:  Patient   Risks discussed:  Bleeding, incomplete drainage, pain and infection (Damage to other structures, need for additional procedures, spread of infection,  damage to other structures, disability that may be severe)   Alternatives discussed:  No treatment, alternative treatment and  referral Location:    Indications for incision and drainage: Infected cyst.   Size:  Induration is 8cmx7cm   Location:  Trunk   Trunk location:  Back Pre-procedure details:    Skin preparation:  Chloraprep Anesthesia (see MAR for exact dosages):    Anesthesia method:  Local infiltration and topical application   Topical anesthesia: Cold spray.   Local anesthetic:  Lidocaine 2% WITH epi Procedure type:    Complexity:  Complex Procedure details:    Incision types:  Single straight   Incision depth:  Subcutaneous   Scalpel blade:  11   Wound management:  Probed and deloculated, irrigated with saline and debrided   Drainage:  Bloody and purulent (Cystic material)   Drainage amount:  Moderate   Wound treatment:  Wound left open   Packing materials:  1/4 in gauze   Amount 1/4":  One piece Post-procedure details:    Patient tolerance of procedure:  Tolerated well, no immediate complications   (including critical care time)  Medications Ordered in ED Medications  lidocaine-EPINEPHrine (XYLOCAINE W/EPI) 2 %-1:200000 (PF) injection 10 mL (10 mLs Infiltration Given by Other 10/04/20 2059)  doxycycline (VIBRA-TABS) tablet 100 mg (100 mg Oral Given 10/04/20 2209)  Tdap (BOOSTRIX) injection 0.5 mL (0.5 mLs Intramuscular Given 10/04/20 2209)    ED Course  I have reviewed the triage vital signs and the nursing notes.  Pertinent labs & imaging results that were available during my care of the patient were reviewed by me and considered in my medical decision making (see chart for details).    MDM Rules/Calculators/A&P                         Patient is a 64 year old woman with multiple comorbidities who presents today for evaluation of a suspected infected cyst on her back.  She feels well.  Here she is significantly hypertensive (importance of PCP follow-up was discussed), however is afebrile, not tachycardic or tachypneic and feels baseline.  After we discussed treatment options patient  gave informed verbal consent for incision and drainage.  She is aware that as this appears to be a infected cyst she will most likely need additional procedures to fully remove the cyst and there is a chance of recurrence.  I&D was performed, please see procedure note for details.  As patient does not know last tetanus shot this is updated.  She is given her first dose of doxycycline while in the emergency room.  She is given a prescription for for additional doxycycline as an outpatient.  She is informed that she needs to either return to the emergency room or see her PCP in 2 days for packing removal and wound check.  Return precautions were discussed with patient who states their understanding.  At the time of discharge patient denied any unaddressed complaints or concerns.  Patient is agreeable for discharge home.  Note: Portions of this report may have been transcribed using voice recognition software. Every effort was made to ensure accuracy; however, inadvertent computerized transcription errors may be present  Final Clinical Impression(s) / ED Diagnoses Final diagnoses:  Infected cyst of skin  Hypertension, unspecified type    Rx / DC Orders ED Discharge Orders         Ordered    doxycycline (VIBRAMYCIN) 100 MG capsule  2 times daily  10/04/20 2158           Lorin Glass, PA-C 10/05/20 Lynnell Catalan    Isla Pence, MD 10/05/20 [phone]

## 2020-10-04 NOTE — ED Triage Notes (Signed)
Pt c/o abscess to middle of back since Friday.

## 2020-10-04 NOTE — Discharge Instructions (Addendum)
Please take Ibuprofen (Advil, motrin) and Tylenol (acetaminophen) to relieve your pain.  You may take up to 600 MG (3 pills) of normal strength ibuprofen every 8 hours as needed.  In between doses of ibuprofen you make take tylenol, up to 1,000 mg (two extra strength pills).  Do not take more than 3,000 mg tylenol in a 24 hour period.  Please check all medication labels as many medications such as pain and cold medications may contain tylenol.  Do not drink alcohol while taking these medications.  Do not take other NSAID'S while taking ibuprofen (such as aleve or naproxen).  Please take ibuprofen with food to decrease stomach upset.  While in the ED your blood pressure was high.  Please follow up with your primary care doctor or the wellness clinic for repeat evaluation as you may need medication.  High blood pressure can cause long term, potentially serious, damage if left untreated.

## 2020-10-06 ENCOUNTER — Ambulatory Visit: Payer: Medicare HMO | Admitting: Cardiology

## 2020-10-11 DIAGNOSIS — Z6841 Body Mass Index (BMI) 40.0 and over, adult: Secondary | ICD-10-CM | POA: Diagnosis not present

## 2020-10-11 DIAGNOSIS — I10 Essential (primary) hypertension: Secondary | ICD-10-CM | POA: Diagnosis not present

## 2020-10-11 DIAGNOSIS — L02212 Cutaneous abscess of back [any part, except buttock]: Secondary | ICD-10-CM | POA: Diagnosis not present

## 2020-10-21 ENCOUNTER — Other Ambulatory Visit: Payer: Self-pay | Admitting: Cardiology

## 2020-11-10 ENCOUNTER — Ambulatory Visit (INDEPENDENT_AMBULATORY_CARE_PROVIDER_SITE_OTHER): Payer: Medicare HMO | Admitting: Cardiology

## 2020-11-10 ENCOUNTER — Encounter: Payer: Self-pay | Admitting: Cardiology

## 2020-11-10 ENCOUNTER — Telehealth: Payer: Self-pay | Admitting: Cardiology

## 2020-11-10 VITALS — BP 148/72 | HR 78 | Ht 65.0 in | Wt 343.0 lb

## 2020-11-10 DIAGNOSIS — I5022 Chronic systolic (congestive) heart failure: Secondary | ICD-10-CM | POA: Diagnosis not present

## 2020-11-10 DIAGNOSIS — I739 Peripheral vascular disease, unspecified: Secondary | ICD-10-CM

## 2020-11-10 MED ORDER — CARVEDILOL 12.5 MG PO TABS
12.5000 mg | ORAL_TABLET | Freq: Two times a day (BID) | ORAL | 1 refills | Status: DC
Start: 2020-11-10 — End: 2021-05-05

## 2020-11-10 NOTE — Progress Notes (Signed)
Clinical Summary Maureen Jackson is a 64 y.o.female seen today for follow up of the following medical problems.    1. Chronic systolic HF/ICM/CAD - patient admitted with acute onset CHF 08/2017.  - echo 08/2017 LVEF 30-35%, grade I diastoilc dysfunction - 09/2017 cath as reported below, found to have severe multivessel disease of LAD, LCX, and RCA. Mean PA 33, PCWP 25, CI 2.7 - seen by CT surgery, recs were for PCI as opposed to CABG. -Received DES to LAD and DES x 2 to RCA, LCX disease managed medically. Recs for lifelong plavix per interventional cards. Has been on high dose ASA per neurology. -discharge weight 09/13/17 312 lbs  08/2018 echo limited visually, LVEF 30-40% Jan 2020 echo: LVEF 35-40%, though difficult study even with contrast  - weight up from 326 in 11/2018 to 343 lbs.  -she reports increased appetite. No recent SOB/DOE, no orthopnea - compliant with meds  - recent admission Jan 2021 with covid pneumonia - admission complicated by acute on chronic systolic HF, AKI, hypotension - entresto and aldactone held. Discharge Cr improved to 1.8,baseline is around 2 - no SOB/DOE. No recent edema - compliant with meds    05/2020 echo:LVEF 30%, normal RV function, mod pericardial effusion Weight was 332 lbs at 07/19/20 appt, today 343 lbs.  - no recent edema. No SOB or DOE.  - has not taken meds yet today.    2. Prior ovid pneumonia - discharged Jan 20,2021  3. PAD 05/2020 ABI: right 1.11 left 1.24, however limited distal evaluation. Suggested right posterior tibial occlusion and abnormal left distal waveforms - no recent leg pains.   Has not covid vaccine. I have strongly recommended Past Medical History:  Diagnosis Date  . 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 Bleeding from mouth    when she brushed teeth or ate  . Chronic systolic CHF (congestive heart failure) ([place])   . Coronary artery disease 09/2017   a. multivessel CAD by cath in 09/2017 and not felt  to be a CABG candidate --> underwent two-vessel PCI with DES to the LAD and DES to the RCA  . Diabetes mellitus   . Dyspnea   . Glaucoma   . Hypertension   . Left bundle Kenard Morawski block   . Morbid obesity ([place])   . Obstructive sleep apnea    does not wear CPAP  . Thyroid disease      Allergies  Allergen Reactions  . Ampicillin Other (See Comments)    "Allergic," per MAR  . Aspirin Nausea Only  . Hydrocodone Other (See Comments)    "Allergic," per MAR  . Unasyn [Ampicillin-Sulbactam Sodium] Rash and Other (See Comments)    "Allergic," per [facility]     Current Outpatient Medications  Medication Sig Dispense Refill  . acetaminophen (TYLENOL) 325 MG tablet Take 2 tablets (650 mg total) by mouth every 4 (four) hours as needed for mild pain, fever or headache. 12 tablet 0  . amLODipine (NORVASC) 5 MG tablet Take 1 tablet (5 mg total) by mouth daily. 90 tablet 3  . aspirin EC 81 MG tablet Take 1 tablet (81 mg total) by mouth daily with breakfast. 30 tablet 11  . atorvastatin (LIPITOR) 80 MG tablet Take 1 tablet (80 mg total) by mouth daily. 30 tablet 2  . calcium carbonate (TUMS - DOSED IN MG ELEMENTAL CALCIUM) 500 MG chewable tablet Chew 4 tablets by mouth daily.    . carvedilol (COREG) 6.25 MG tablet  Take 1 tablet (6.25 mg total) by mouth 2 (two) times daily with a meal. 60 tablet 3  . cetirizine (ZYRTEC) 10 MG tablet Take 10 mg by mouth daily as needed for allergies.   0  . clopidogrel (PLAVIX) 75 MG tablet Take 1 tablet (75 mg total) by mouth daily. 90 tablet 1  . doxycycline (VIBRAMYCIN) 100 MG capsule Take 1 capsule (100 mg total) by mouth 2 (two) times daily. 20 capsule 0  . ENTRESTO 97-103 MG TAKE 1 TABLET BY MOUTH TWICE DAILY 180 tablet 2  . furosemide (LASIX) 40 MG tablet Take 1 tablet (40 mg total) by mouth 2 (two) times daily. 60 tablet 1  . levothyroxine (SYNTHROID) 200 MCG tablet Take 1 tablet (200 mcg total) by mouth daily before breakfast. Take BEFORE Breakfast 30 tablet 3  .  NOVOLOG FLEXPEN 100 UNIT/ML FlexPen Inject 18 Units into the skin 3 (three) times daily with meals. 15 mL 1  . senna-docusate (SENOKOT-S) 8.6-50 MG tablet Take 2 tablets by mouth 2 (two) times daily. (Patient taking differently: Take 2 tablets by mouth daily as needed for mild constipation or moderate constipation. ) 60 tablet 3  . timolol (TIMOPTIC) 0.5 % ophthalmic solution Place 1 drop into both eyes 2 (two) times daily.  5  . TRESIBA FLEXTOUCH 100 UNIT/ML SOPN FlexTouch Pen Inject 60 Units into the skin at bedtime.     . VENTOLIN HFA 108 (90 Base) MCG/ACT inhaler Inhale 1-2 puffs into the lungs every 6 (six) hours as needed for wheezing or shortness of breath. 18 g 1   No current facility-administered medications for this visit.     Past Surgical History:  Procedure Laterality Date  . ABDOMINAL HYSTERECTOMY    . CARDIAC CATHETERIZATION  09/12/2017  . CORONARY STENT INTERVENTION  09/12/2017   STENT RESOLUTE ONYX 1.22Q82 drug eluting stent was successfully placed  . CORONARY STENT INTERVENTION N/A 09/12/2017   Procedure: CORONARY STENT INTERVENTION;  Surgeon: Leonie Man, MD;  Location: [place] [facility];  Service: Cardiovascular;  Laterality: N/A;  . INCISION AND DRAINAGE PERIRECTAL ABSCESS Left 09/16/2019   Procedure: IRRIGATION AND DEBRIDEMENT LABIA ABSCESS;  Surgeon: Coralie Keens, MD;  Location: [place];  Service: General;  Laterality: Left;  . IR FLUORO GUIDE CV LINE RIGHT  09/16/2019  . IR US GUIDE VASC ACCESS RIGHT  09/16/2019  . LEFT HEART CATH AND CORONARY ANGIOGRAPHY N/A 09/12/2017   Procedure: LEFT HEART CATH AND CORONARY ANGIOGRAPHY;  Surgeon: Leonie Man, MD;  Location: [place] [facility];  Service: Cardiovascular;  Laterality: N/A;  . MASS EXCISION N/A 12/18/2018   Procedure: EXCISION TONGUE MASS;  Surgeon: Leta Baptist, MD;  Location: [place] [facility];  Service: ENT;  Laterality: N/A;  . RIGHT/LEFT HEART CATH AND CORONARY ANGIOGRAPHY N/A 09/10/2017   Procedure: RIGHT/LEFT HEART  CATH AND CORONARY ANGIOGRAPHY;  Surgeon: Troy Sine, MD;  Location: [place] [facility];  Service: Cardiovascular;  Laterality: N/A;  . THYROID SURGERY       Allergies  Allergen Reactions  . Ampicillin Other (See Comments)    "Allergic," per MAR  . Aspirin Nausea Only  . Hydrocodone Other (See Comments)    "Allergic," per MAR  . Unasyn [Ampicillin-Sulbactam Sodium] Rash and Other (See Comments)    "Allergic," per [facility]      Family History  Problem Relation Age of Onset  . Diabetes Mother   . Hypertension Mother   . Cancer Mother        pancreas  .  Hypertension Sister      Social History Ms. Sapia reports that she quit smoking about 14 years ago. She has never used smokeless tobacco. Ms. Panameno reports no history of alcohol use.   Review of Systems CONSTITUTIONAL: No weight loss, fever, chills, weakness or fatigue.  HEENT: Eyes: No visual loss, blurred vision, double vision or yellow sclerae.No hearing loss, sneezing, congestion, runny nose or sore throat.  SKIN: No rash or itching.  CARDIOVASCULAR: per hpi RESPIRATORY: No shortness of breath, cough or sputum.  GASTROINTESTINAL: No anorexia, nausea, vomiting or diarrhea. No abdominal pain or blood.  GENITOURINARY: No burning on urination, no polyuria NEUROLOGICAL: No headache, dizziness, syncope, paralysis, ataxia, numbness or tingling in the extremities. No change in bowel or bladder control.  MUSCULOSKELETAL: No muscle, back pain, joint pain or stiffness.  LYMPHATICS: No enlarged nodes. No history of splenectomy.  PSYCHIATRIC: No history of depression or anxiety.  ENDOCRINOLOGIC: No reports of sweating, cold or heat intolerance. No polyuria or polydipsia.  Marland Kitchen   Physical Examination Today's Vitals   11/10/20 0917  BP: (!) 148/72  Pulse: 78  SpO2: 96%  Weight: (!) 343 lb (155.6 kg)  Height: 5\' 5"  (1.651 m)   Body mass index is 57.08 kg/m.  Gen: resting comfortably, no acute distress HEENT: no scleral  icterus, pupils equal round and reactive, no palptable cervical adenopathy,  CV: RRR, no m/r/g, no jvd Resp: Clear to auscultation bilaterally GI: abdomen is soft, non-tender, non-distended, normal bowel sounds, no hepatosplenomegaly MSK: extremities are warm, no edema.  Skin: warm, no rash Neuro:  no focal deficits Psych: appropriate affect   Diagnostic Studies     Assessment and Plan  1. Chronic systolic HF -recent weight gain over the last several months but no signs of fluid overload or symptoms, she reports poor dietary habits in regards to calories intake - increase coreg to 12.5mg  bid, repeat labs. May consider restarting low dose aldactone pending labs  2. PAD - ABIs as reproted above, possible distal disease despite normal ABI numbers - no symptoms, monitor at this time, continue medical therapy.   3. CAD - no symptoms, continue current meds   Arnoldo Lenis, M.D.

## 2020-11-10 NOTE — Patient Instructions (Signed)
Your physician recommends that you schedule a follow-up appointment in: [place] has recommended you make the following change in your medication:   [place] 12.5 MG [place] physician recommends that you return for lab work BMP/MG  Thank you for choosing [facility]!!

## 2020-11-10 NOTE — Telephone Encounter (Signed)
New message    Which covid vaccine do you recommend her getting?

## 2020-11-10 NOTE — Telephone Encounter (Signed)
Advised that we do recommend you get the Covid vaccine Emergency planning/management officer or [employer]). However, if you have additional questions please call your Primary Care doctor to discuss if the Vaccine is right for you.

## 2020-11-11 ENCOUNTER — Other Ambulatory Visit: Payer: Self-pay

## 2020-11-11 ENCOUNTER — Other Ambulatory Visit (HOSPITAL_COMMUNITY)
Admission: RE | Admit: 2020-11-11 | Discharge: 2020-11-11 | Disposition: A | Discharge status: Home / Self Care | Payer: Medicare HMO | Source: Ambulatory Visit | Attending: Cardiology | Admitting: Cardiology

## 2020-11-11 DIAGNOSIS — I110 Hypertensive heart disease with heart failure: Secondary | ICD-10-CM | POA: Insufficient documentation

## 2020-11-11 DIAGNOSIS — I5022 Chronic systolic (congestive) heart failure: Secondary | ICD-10-CM | POA: Insufficient documentation

## 2020-11-11 LAB — BASIC METABOLIC PANEL
Anion gap: 8 (ref 5–15)
BUN: 27 mg/dL — ABNORMAL HIGH (ref 8–23)
CO2: 25 mmol/L (ref 22–32)
Calcium: 8.1 mg/dL — ABNORMAL LOW (ref 8.9–10.3)
Chloride: 105 mmol/L (ref 98–111)
Creatinine, Ser: 1.18 mg/dL — ABNORMAL HIGH (ref 0.44–1.00)
GFR, Estimated: 52 mL/min — ABNORMAL LOW (ref >60)
Glucose, Bld: 195 mg/dL — ABNORMAL HIGH (ref 70–99)
Potassium: 4.3 mmol/L (ref 3.5–5.1)
Sodium: 138 mmol/L (ref 135–145)

## 2020-11-11 LAB — MAGNESIUM: Magnesium: 2.1 mg/dL (ref 1.7–2.4)

## 2020-11-19 ENCOUNTER — Telehealth: Payer: Self-pay | Admitting: *Deleted

## 2020-11-19 DIAGNOSIS — I5022 Chronic systolic (congestive) heart failure: Secondary | ICD-10-CM

## 2020-11-19 MED ORDER — SPIRONOLACTONE 25 MG PO TABS
12.5000 mg | ORAL_TABLET | Freq: Every day | ORAL | 1 refills | Status: DC
Start: 2020-11-19 — End: 2022-02-09

## 2020-11-19 NOTE — Telephone Encounter (Signed)
-----   Message from Arnoldo Lenis, MD sent at 11/19/2020 10:37 AM EST ----- Labs look good, can we start aldactone 12.5mg  daily and repaet a BMET in 2 weeks   Zandra Abts MD

## 2020-11-19 NOTE — Telephone Encounter (Signed)
Pt voiced understanding - med list updated and pt will have labs done at [facility] in 2 weeks - lab orders placed

## 2020-11-30 DIAGNOSIS — R69 Illness, unspecified: Secondary | ICD-10-CM | POA: Diagnosis not present

## 2020-12-11 ENCOUNTER — Other Ambulatory Visit: Payer: Self-pay

## 2020-12-11 ENCOUNTER — Encounter (HOSPITAL_COMMUNITY): Payer: Self-pay | Admitting: *Deleted

## 2020-12-11 ENCOUNTER — Emergency Department (HOSPITAL_COMMUNITY): Payer: Medicare Other

## 2020-12-11 ENCOUNTER — Emergency Department (HOSPITAL_COMMUNITY)
Admission: EM | Admit: 2020-12-11 | Discharge: 2020-12-11 | Disposition: A | Discharge status: Home / Self Care | Payer: Medicare Other | Attending: Emergency Medicine | Admitting: Emergency Medicine

## 2020-12-11 DIAGNOSIS — R059 Cough, unspecified: Secondary | ICD-10-CM | POA: Diagnosis not present

## 2020-12-11 DIAGNOSIS — I110 Hypertensive heart disease with heart failure: Secondary | ICD-10-CM | POA: Diagnosis not present

## 2020-12-11 DIAGNOSIS — Z955 Presence of coronary angioplasty implant and graft: Secondary | ICD-10-CM | POA: Insufficient documentation

## 2020-12-11 DIAGNOSIS — E119 Type 2 diabetes mellitus without complications: Secondary | ICD-10-CM | POA: Diagnosis not present

## 2020-12-11 DIAGNOSIS — R5381 Other malaise: Secondary | ICD-10-CM | POA: Insufficient documentation

## 2020-12-11 DIAGNOSIS — J811 Chronic pulmonary edema: Secondary | ICD-10-CM | POA: Diagnosis not present

## 2020-12-11 DIAGNOSIS — Z87891 Personal history of nicotine dependence: Secondary | ICD-10-CM | POA: Diagnosis not present

## 2020-12-11 DIAGNOSIS — I2510 Atherosclerotic heart disease of native coronary artery without angina pectoris: Secondary | ICD-10-CM | POA: Diagnosis not present

## 2020-12-11 DIAGNOSIS — B349 Viral infection, unspecified: Secondary | ICD-10-CM | POA: Diagnosis not present

## 2020-12-11 DIAGNOSIS — R0602 Shortness of breath: Secondary | ICD-10-CM | POA: Diagnosis not present

## 2020-12-11 DIAGNOSIS — I5022 Chronic systolic (congestive) heart failure: Secondary | ICD-10-CM | POA: Diagnosis not present

## 2020-12-11 DIAGNOSIS — Z20822 Contact with and (suspected) exposure to covid-19: Secondary | ICD-10-CM | POA: Diagnosis not present

## 2020-12-11 DIAGNOSIS — R5383 Other fatigue: Secondary | ICD-10-CM | POA: Diagnosis not present

## 2020-12-11 LAB — CBC WITH DIFFERENTIAL/PLATELET
Abs Immature Granulocytes: 0.01 10*3/uL (ref 0.00–0.07)
Basophils Absolute: 0 10*3/uL (ref 0.0–0.1)
Basophils Relative: 0 %
Eosinophils Absolute: 0.4 10*3/uL (ref 0.0–0.5)
Eosinophils Relative: 7 %
HCT: 36 % (ref 36.0–46.0)
Hemoglobin: 10.9 g/dL — ABNORMAL LOW (ref 12.0–15.0)
Immature Granulocytes: 0 %
Lymphocytes Relative: 36 %
Lymphs Abs: 1.9 10*3/uL (ref 0.7–4.0)
MCH: 26 pg (ref 26.0–34.0)
MCHC: 30.3 g/dL (ref 30.0–36.0)
MCV: 85.9 fL (ref 80.0–100.0)
Monocytes Absolute: 0.4 10*3/uL (ref 0.1–1.0)
Monocytes Relative: 9 %
Neutro Abs: 2.5 10*3/uL (ref 1.7–7.7)
Neutrophils Relative %: 48 %
Platelets: 234 10*3/uL (ref 150–400)
RBC: 4.19 MIL/uL (ref 3.87–5.11)
RDW: 14.7 % (ref 11.5–15.5)
WBC: 5.1 10*3/uL (ref 4.0–10.5)
nRBC: 0 % (ref 0.0–0.2)

## 2020-12-11 LAB — RESP PANEL BY RT-PCR (FLU A&B, COVID) ARPGX2
Influenza A by PCR: NEGATIVE
Influenza B by PCR: NEGATIVE
SARS Coronavirus 2 by RT PCR: NEGATIVE

## 2020-12-11 LAB — BASIC METABOLIC PANEL
Anion gap: 10 (ref 5–15)
BUN: 21 mg/dL (ref 8–23)
CO2: 24 mmol/L (ref 22–32)
Calcium: 8.4 mg/dL — ABNORMAL LOW (ref 8.9–10.3)
Chloride: 104 mmol/L (ref 98–111)
Creatinine, Ser: 1.16 mg/dL — ABNORMAL HIGH (ref 0.44–1.00)
GFR, Estimated: 53 mL/min — ABNORMAL LOW (ref >60)
Glucose, Bld: 134 mg/dL — ABNORMAL HIGH (ref 70–99)
Potassium: 3.8 mmol/L (ref 3.5–5.1)
Sodium: 138 mmol/L (ref 135–145)

## 2020-12-11 LAB — BRAIN NATRIURETIC PEPTIDE: B Natriuretic Peptide: 71 pg/mL (ref 0.0–100.0)

## 2020-12-11 MED ORDER — AZITHROMYCIN 250 MG PO TABS: ORAL_TABLET | ORAL | 0 refills | Status: DC

## 2020-12-11 NOTE — Discharge Instructions (Addendum)
You were evaluated in the Emergency Department and after careful evaluation, we did not find any emergent condition requiring admission or further testing in the hospital.  Your exam/testing today is overall reassuring.  Your symptoms seem to be due to a viral illness.  Your testing did not show any signs of heart failure.  We are providing you with an antibiotic just in case you have a bacterial pneumonia.  Please return to the Emergency Department if you experience any worsening of your condition.   Thank you for allowing [place] to be a part of your care.

## 2020-12-11 NOTE — ED Provider Notes (Signed)
[facility] Emergency Department Provider Note MRN:  [id number]  Arrival date & time: 12/11/20     Chief Complaint   Shortness of Breath   History of Present Illness   Maureen Jackson is a 65 y.o. year-old female with a history of CHF, obesity presenting to the ED with chief complaint of shortness of.  Cough, shortness of breath, chills past 4 days, denies chest pain, no headache or vision change, complaining of malaise and fatigue.  Denies abdominal pain, no leg pain or swelling, no other complaints.  Symptoms are constant, mild to moderate, no other exacerbating or alleviating factors.  Review of Systems  A complete 10 system review of systems was obtained and all systems are negative except as noted in the HPI and PMH.   Patient's Health History    Past Medical History:  Diagnosis Date  . 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 Bleeding from mouth    when she brushed teeth or ate  . Chronic systolic CHF (congestive heart failure) ([place])   . Coronary artery disease 09/2017   a. multivessel CAD by cath in 09/2017 and not felt to be a CABG candidate --> underwent two-vessel PCI with DES to the LAD and DES to the RCA  . Diabetes mellitus   . Dyspnea   . Glaucoma   . Hypertension   . Left bundle branch block   . Morbid obesity ([place])   . Obstructive sleep apnea    does not wear CPAP  . Thyroid disease     Past Surgical History:  Procedure Laterality Date  . ABDOMINAL HYSTERECTOMY    . CARDIAC CATHETERIZATION  09/12/2017  . CORONARY STENT INTERVENTION  09/12/2017   STENT RESOLUTE ONYX [id number] drug eluting stent was successfully placed  . CORONARY STENT INTERVENTION N/A 09/12/2017   Procedure: CORONARY STENT INTERVENTION;  Surgeon: Leonie Man, MD;  Location: [facility];  Service: Cardiovascular;  Laterality: N/A;  . INCISION AND DRAINAGE PERIRECTAL ABSCESS Left 09/16/2019   Procedure: IRRIGATION AND DEBRIDEMENT LABIA ABSCESS;  Surgeon: Coralie Keens, MD;  Location: [facility];  Service: General;  Laterality: Left;  . IR FLUORO GUIDE CV LINE RIGHT  09/16/2019  . IR US GUIDE VASC ACCESS RIGHT  09/16/2019  . LEFT HEART CATH AND CORONARY ANGIOGRAPHY N/A 09/12/2017   Procedure: LEFT HEART CATH AND CORONARY ANGIOGRAPHY;  Surgeon: Leonie Man, MD;  Location: [facility];  Service: Cardiovascular;  Laterality: N/A;  . MASS EXCISION N/A 12/18/2018   Procedure: EXCISION TONGUE MASS;  Surgeon: Leta Baptist, MD;  Location: [facility];  Service: ENT;  Laterality: N/A;  . RIGHT/LEFT HEART CATH AND CORONARY ANGIOGRAPHY N/A 09/10/2017   Procedure: RIGHT/LEFT HEART CATH AND CORONARY ANGIOGRAPHY;  Surgeon: Troy Sine, MD;  Location: [facility];  Service: Cardiovascular;  Laterality: N/A;  . THYROID SURGERY      Family History  Problem Relation Age of Onset  . Diabetes Mother   . Hypertension Mother   . Cancer Mother        pancreas  . Hypertension Sister     Social History   Socioeconomic History  . Marital status: Widowed    Spouse name: Not on file  . Number of children: Not on file  . Years of education: Not on file  . Highest education level: Not on file  Occupational History  . 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  Smoking status: Former Smoker    Quit date: 12/14/2005    Years since quitting: 15.0  . Smokeless tobacco: Never Used  . Tobacco comment: QUIT IN 2005  Vaping Use  . Vaping Use: Never used  Substance and Sexual Activity  . Alcohol use: No  . Drug use: No  . Sexual activity: Never  Other Topics Concern  . Not on file  Social History Narrative  . Not on file   Social Determinants of Health   Financial Resource Strain: Not on file  Food Insecurity: No Food Insecurity  . Worried About Charity fundraiser in the Last Year: Never true  . Ran Out of Food in the Last Year: Never true  Transportation Needs: Unmet Transportation Needs  . Lack of Transportation (Medical): Yes   . Lack of Transportation (Non-Medical): No  Physical Activity: Not on file  Stress: Not on file  Social Connections: Not on file  Intimate Partner Violence: Not on file     Physical Exam   Vitals:   12/11/20 1918 12/11/20 1931  BP: (!) 135/51 134/84  Pulse: 88 93  Resp: 18 (!) 24  Temp:  99.5 F (37.5 C)  SpO2: 100% 93%    CONSTITUTIONAL: Chronically ill-appearing, NAD, obese NEURO:  Alert and oriented x 3, no focal deficits EYES:  eyes equal and reactive ENT/NECK:  no LAD, no JVD CARDIO: Regular rate, well-perfused, normal S1 and S2 PULM:  CTAB no wheezing or rhonchi, no increased work of breathing GI/GU:  normal bowel sounds, non-distended, non-tender MSK/SPINE:  No gross deformities, no edema SKIN:  no rash, atraumatic PSYCH:  Appropriate speech and behavior  *Additional and/or pertinent findings included in MDM below  Diagnostic and Interventional Summary    EKG Interpretation  Date/Time:  Saturday December 11 2020 15:32:51 EST Ventricular Rate:  95 PR Interval:  132 QRS Duration: 148 QT Interval:  396 QTC Calculation: 497 R Axis:   58 Text Interpretation: Normal sinus rhythm Left bundle branch block Abnormal ECG No significant change was found Confirmed by Gerlene Fee [phone]) on 12/11/2020 3:43:28 PM      Labs Reviewed  CBC WITH DIFFERENTIAL/PLATELET - Abnormal; Notable for the following components:      Result Value   Hemoglobin 10.9 (*)    All other components within normal limits  BASIC METABOLIC PANEL - Abnormal; Notable for the following components:   Glucose, Bld 134 (*)    Creatinine, Ser 1.16 (*)    Calcium 8.4 (*)    GFR, Estimated 53 (*)    All other components within normal limits  RESP PANEL BY RT-PCR (FLU A&B, COVID) ARPGX2  BRAIN NATRIURETIC PEPTIDE    DG Chest Portable 1 View  Final Result      Medications - No data to display   Procedures  /  Critical Care Procedures  ED Course and Medical Decision Making  I have reviewed the  triage vital signs, the nursing notes, and pertinent available records from the EMR.  Listed above are laboratory and imaging tests that I personally ordered, reviewed, and interpreted and then considered in my medical decision making (see below for details).  Consistent with viral illness, patient has tested negative for Covid and negative for flu.  No increased work of breathing, chest x-ray overall reassuring, labs overall unremarkable.  Patient is appropriate for discharge, will cover with Z-Pak.       Barth Kirks. [place]onia Small, [place] [email or web address]  Final  Clinical Impressions(s) / ED Diagnoses     ICD-10-CM   1. Viral illness  B34.9     ED Discharge Orders         Ordered    azithromycin (ZITHROMAX) 250 MG tablet        12/11/20 2013           Discharge Instructions Discussed with and Provided to Patient:     Discharge Instructions     You were evaluated in the Emergency Department and after careful evaluation, we did not find any emergent condition requiring admission or further testing in the hospital.  Your exam/testing today is overall reassuring.  Your symptoms seem to be due to a viral illness.  Your testing did not show any signs of heart failure.  We are providing you with an antibiotic just in case you have a bacterial pneumonia.  Please return to the Emergency Department if you experience any worsening of your condition.   Thank you for allowing [place] to be a part of your care.      Maudie Flakes, MD 12/11/20 2015

## 2020-12-11 NOTE — ED Triage Notes (Signed)
Pt with sob, cough and chills since Monday.  Denies any known Covid exposure.

## 2020-12-23 ENCOUNTER — Other Ambulatory Visit: Payer: Self-pay | Admitting: Cardiology

## 2021-03-08 ENCOUNTER — Ambulatory Visit: Payer: Medicare HMO | Admitting: Cardiology

## 2021-03-28 ENCOUNTER — Ambulatory Visit: Payer: Medicare Other | Admitting: Family Medicine

## 2021-04-07 NOTE — Progress Notes (Signed)
Cardiology Office Note  Date: 04/08/2021   ID: Maureen Jackson, DOB May 14, 1956, MRN [id number]  PCP:  Neale Burly, MD  Cardiologist:  Carlyle Dolly, MD Electrophysiologist:  None    Chief Complaint:  3 month follow up  History of Present Illness: Maureen Jackson is a 65 y.o. female with a history of chronic systolic heart failure, CAD, DM 2, HTN, LBBB, morbid obesity, OSA, thyroid disease, dyspnea.  Last seen by Dr. Harl Bowie 11/10/2020.  She had experienced some recent weight gain over the prior several months but no signs of fluid overload or symptoms.  She reported poor dietary habits with regard to calorie intake.  Carvedilol was increased to 12.5 mg p.o. twice daily daily.  Repeat labs were ordered.  Dr. Harl Bowie stated would consider restarting low-dose Aldactone pending labs.  Patient had recent ABIs with possible distal disease despite normal ABI numbers.  She reported no anginal or exertional symptoms.  She was continuing her current medications.  She is here today for 25-month follow-up.  She denies any issues in the interim since last follow-up.  States she is having hard time trying to exercise and lose weight.  She has gained some weight since last visit current weight is 347.  In December 2021 on her weight was 343.  Her blood pressure is better controlled at 128/86.  She was started back on her spironolactone after stable labs at last follow-up.  She denies any significant anginal or exertional symptoms.  States her weight causes her to have some lower back pain.  She denies any significant shortness of breath, or lower extremity edema.  No signs of fluid overload.  She states she has not been eating well.  She denies any CVA or TIA-like symptoms, orthostatic symptoms, palpitations or arrhythmias, orthostatic symptoms, PND, orthopnea.  Denies any bleeding.  She states walking short distances such as walking in [place] causes lower leg pain and she has to use the electronic scooters  to get around in [place].   Past Medical History:  Diagnosis Date  . 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 Bleeding from mouth    when she brushed teeth or ate  . Chronic systolic CHF (congestive heart failure) ([place])   . Coronary artery disease 09/2017   a. multivessel CAD by cath in 09/2017 and not felt to be a CABG candidate --> underwent two-vessel PCI with DES to the LAD and DES to the RCA  . Diabetes mellitus   . Dyspnea   . Glaucoma   . Hypertension   . Left bundle branch block   . Morbid obesity ([place])   . Obstructive sleep apnea    does not wear CPAP  . Thyroid disease     Past Surgical History:  Procedure Laterality Date  . ABDOMINAL HYSTERECTOMY    . CARDIAC CATHETERIZATION  09/12/2017  . CORONARY STENT INTERVENTION  09/12/2017   STENT RESOLUTE ONYX 7.89F81 drug eluting stent was successfully placed  . CORONARY STENT INTERVENTION N/A 09/12/2017   Procedure: CORONARY STENT INTERVENTION;  Surgeon: Leonie Man, MD;  Location: [facility];  Service: Cardiovascular;  Laterality: N/A;  . INCISION AND DRAINAGE PERIRECTAL ABSCESS Left 09/16/2019   Procedure: IRRIGATION AND DEBRIDEMENT LABIA ABSCESS;  Surgeon: Coralie Keens, MD;  Location: [facility];  Service: General;  Laterality: Left;  . IR FLUORO GUIDE CV LINE RIGHT  09/16/2019  . IR US GUIDE VASC ACCESS RIGHT  09/16/2019  . LEFT HEART CATH  AND CORONARY ANGIOGRAPHY N/A 09/12/2017   Procedure: LEFT HEART CATH AND CORONARY ANGIOGRAPHY;  Surgeon: Leonie Man, MD;  Location: [facility];  Service: Cardiovascular;  Laterality: N/A;  . MASS EXCISION N/A 12/18/2018   Procedure: EXCISION TONGUE MASS;  Surgeon: Leta Baptist, MD;  Location: [facility];  Service: ENT;  Laterality: N/A;  . RIGHT/LEFT HEART CATH AND CORONARY ANGIOGRAPHY N/A 09/10/2017   Procedure: RIGHT/LEFT HEART CATH AND CORONARY ANGIOGRAPHY;  Surgeon: Troy Sine, MD;  Location: [facility];  Service: Cardiovascular;  Laterality: N/A;  . THYROID SURGERY       Current Outpatient Medications  Medication Sig Dispense Refill  . acetaminophen (TYLENOL) 325 MG tablet Take 2 tablets (650 mg total) by mouth every 4 (four) hours as needed for mild pain, fever or headache. 12 tablet 0  . amLODipine (NORVASC) 5 MG tablet Take 1 tablet (5 mg total) by mouth daily. 90 tablet 3  . aspirin EC 81 MG tablet Take 1 tablet (81 mg total) by mouth daily with breakfast. 30 tablet 11  . atorvastatin (LIPITOR) 80 MG tablet Take 1 tablet (80 mg total) by mouth daily. 30 tablet 2  . calcium carbonate (TUMS - DOSED IN MG ELEMENTAL CALCIUM) 500 MG chewable tablet Chew 4 tablets by mouth daily.    . carvedilol (COREG) 12.5 MG tablet Take 1 tablet (12.5 mg total) by mouth 2 (two) times daily. 180 tablet 1  . cetirizine (ZYRTEC) 10 MG tablet Take 10 mg by mouth daily as needed for allergies.   0  . clopidogrel (PLAVIX) 75 MG tablet TAKE 1 TABLET(75 MG) BY MOUTH DAILY 90 tablet 1  . doxycycline (VIBRAMYCIN) 100 MG capsule Take 1 capsule (100 mg total) by mouth 2 (two) times daily. 20 capsule 0  . ENTRESTO 97-103 MG TAKE 1 TABLET BY MOUTH TWICE DAILY 180 tablet 2  . furosemide (LASIX) 40 MG tablet Take 1 tablet (40 mg total) by mouth 2 (two) times daily. 60 tablet 1  . levothyroxine (SYNTHROID) 200 MCG tablet Take 1 tablet (200 mcg total) by mouth daily before breakfast. Take BEFORE Breakfast 30 tablet 3  . NOVOLOG FLEXPEN 100 UNIT/ML FlexPen Inject 18 Units into the skin 3 (three) times daily with meals. 15 mL 1  . senna-docusate (SENOKOT-S) 8.6-50 MG tablet Take 2 tablets by mouth 2 (two) times daily. (Patient taking differently: Take 2 tablets by mouth daily as needed for mild constipation or moderate constipation.) 60 tablet 3  . spironolactone (ALDACTONE) 25 MG tablet Take 0.5 tablets (12.5 mg total) by mouth daily. 45 tablet 1  . timolol (TIMOPTIC) 0.5 % ophthalmic solution Place 1 drop into both eyes 2 (two) times daily.  5  . TRESIBA FLEXTOUCH 100 UNIT/ML SOPN FlexTouch  Pen Inject 60 Units into the skin at bedtime.     . VENTOLIN HFA 108 (90 Base) MCG/ACT inhaler Inhale 1-2 puffs into the lungs every 6 (six) hours as needed for wheezing or shortness of breath. 18 g 1   No current facility-administered medications for this visit.   Allergies:  Ampicillin, Aspirin, Hydrocodone, and Unasyn [ampicillin-sulbactam sodium]   Social History: The patient  reports that she quit smoking about 15 years ago. She has never used smokeless tobacco. She reports that she does not drink alcohol and does not use drugs.   Family History: The patient's family history includes Cancer in her mother; Diabetes in her mother; Hypertension in her mother and sister.   ROS:  Please see  the history of present illness. Otherwise, complete review of systems is positive for none.  All other systems are reviewed and negative.   Physical Exam: VS:  BP 128/86   Pulse 76   Ht 5\' 5"  (1.651 m)   Wt (!) 347 lb 9.6 oz (157.7 kg)   SpO2 97%   BMI 57.84 kg/m , BMI Body mass index is 57.84 kg/m.  Wt Readings from Last 3 Encounters:  04/08/21 (!) 347 lb 9.6 oz (157.7 kg)  12/11/20 (!) 315 lb (142.9 kg)  11/10/20 (!) 343 lb (155.6 kg)    General: Severely morbidly obese patient appears comfortable at rest. Neck: Supple, no elevated JVP or carotid bruits, no thyromegaly. Lungs: Clear to auscultation, nonlabored breathing at rest. Cardiac: Regular rate and rhythm, no S3 or significant systolic murmur, no pericardial rub. Extremities: No pitting edema, distal pulses 2+. Skin: Warm and dry. Musculoskeletal: No kyphosis. Neuropsychiatric: Alert and oriented x3, affect grossly appropriate.  ECG:  EKG December 11, 2020 normal sinus rhythm left bundle branch block rate of 95  Recent Labwork: 05/23/2020: TSH 14.385 11/11/2020: Magnesium 2.1 12/11/2020: B Natriuretic Peptide 71.0; BUN 21; Creatinine, Ser 1.16; Hemoglobin 10.9; Platelets 234; Potassium 3.8; Sodium 138     Component Value Date/Time    CHOL 147 07/17/2018 0152   TRIG 124 12/26/2019 1546   HDL 31 (L) 07/17/2018 0152   CHOLHDL 4.7 07/17/2018 0152   VLDL 33 07/17/2018 0152   LDLCALC 83 07/17/2018 0152    Other Studies Reviewed Today:  Echocardiogram 05/18/2020  1. Images are limited. Suggest Definity contrast when adequate IV access available. 2. Left ventricular ejection fraction, by estimation, is approximately 30%. The left ventricle has severely decreased function. Left ventricular endocardial border not optimally defined to evaluate regional wall motion. There is moderate left ventricular hypertrophy. Left ventricular diastolic parameters are indeterminate. 3. Right ventricular systolic function is normal. The right ventricular size is normal. 4. Moderate pericardial effusion. The pericardial effusion is circumferential. 5. The mitral valve is grossly normal. Trivial mitral valve regurgitation. 6. The aortic valve is tricuspid. Aortic valve regurgitation is not visualized. 7. The inferior vena cava is normal in size with greater than 50% respiratory variability, suggesting right atrial pressure of 3 mmHg.   Lower extremity arterial duplex 05/24/2020 IMPRESSION: While the ABIs are normal, limited distal evaluation is abnormal with right posterior tibial occlusion and abnormal distal left lower extremity arterial waveforms, suggesting possible medial calcifications artificially elevated the ABIs and underestimates severity of lower extremity arterial occlusive disease.  Should the patient fail conservative treatment, consider MRA runoff (not affected by vascular calcifications, no radiation risk, can be performed noncontrast in the setting of renal dysfunction) to better define the site and nature of arterial occlusive disease and delineate treatment options.   09/11/2017  CORONARY STENT INTERVENTION  LEFT HEART CATH AND CORONARY ANGIOGRAPHY    Conclusion    LESION #1  Prox LAD lesion, 60  %stenosed. Prox LAD to Mid LAD lesion, 65 %stenosed. Mid LAD lesion, 85 %stenosed. Tandem Lesions.  A STENT RESOLUTE ONYX [id number] drug eluting stent was successfully placed. Post-dilated in tapered fashion: 3.6 - 2.9 mm  Post intervention, there is a 0% residual stenosis.  LESION $2  Prox RCA lesion, 45 %stenosed. Prox RCA to Mid RCA lesion, 70 %stenosed. Mid RCA lesion, 90 %stenosed. Tandem Lesions  A STENT RESOLUTE ONYX [id number] drug eluting stent and STENT RESOLUTE ONYX 2.75X26 drug-eluting stent were successfully placed in overlapping fashion.  Post intervention, there is  a 0% residual stenosis. Post-dilated in tapered fashion: 3.6 - 3.1 mm  RPDA lesion, 80 %stenosed. Plan Medical management.   Successful 2 vessel PCI with stable circumflex disease. Plan is to treat circumflex flex disease medically for now.   Plan: Return to nursing unit for post PCI care TR band removal. Continue to optimize medical management, but from a post Standpoint she will be able to be discharged tomorrow. Continue aspirin plus Plavix for minimum one year, but would continue Plavix lifelong     Assessment and Plan:  1. Chronic systolic heart failure ([place])   2. CAD in native artery   3. PAD (peripheral artery disease) ([place])    1. Chronic systolic heart failure [facility]) Last echocardiogram June 2021 showed EF of 30%, moderate LVH moderate pericardial effusion circumferential.  Trivial MR.  She denies any significant shortness of breath.  She has gained some weight which she attributes to dietary indiscretions.  No significant lower extremity edema.  Continue carvedilol 12.5 mg p.o. twice daily.  Continue Entresto 97/103 mg p.o. twice daily.  Continue Lasix 40 mg twice daily.  Continue spironolactone 12.5 mg p.o. daily.  2. CAD in native artery Currently denies any anginal or exertional symptoms.  Continue aspirin 81 mg p.o. daily.  Continue Plavix 75 mg p.o. daily.  Continue carvedilol 12.5 mg p.o.  twice daily.  Continue atorvastatin 80 mg p.o. daily.  3. PAD (peripheral artery disease) ([place]) Denies any claudication-like symptoms other than stating when she walks around in [place] she has leg pain.  She states she has to use the electronic scooters. Lower arterial duplex in June 2021 stating while ABIs were normal  limited distal evaluation was abnormal with a right posterior tibial occlusion and abnormal distal left lower extremity arterial waveforms.  Suggesting possible medial calcifications artificially elevated ABIs and underestimates the severity of lower extremity arterial occlusive disease.  Recommendation was that should patient fail conservative treatment, consider MRA runoff to better define site nature arterial occlusive disease and delineate treatment options  Medication Adjustments/Labs and Tests Ordered: Current medicines are reviewed at length with the patient today.  Concerns regarding medicines are outlined above.   Disposition: Follow-up with Dr. Harl Bowie or APP 3 months  Signed, Levell July, NP 04/08/2021 11:42 AM    [facility], [place], [place] [address] Phone: [phone]; Fax: [phone]

## 2021-04-08 ENCOUNTER — Other Ambulatory Visit: Payer: Self-pay

## 2021-04-08 ENCOUNTER — Encounter: Payer: Self-pay | Admitting: Family Medicine

## 2021-04-08 ENCOUNTER — Ambulatory Visit (INDEPENDENT_AMBULATORY_CARE_PROVIDER_SITE_OTHER): Payer: Medicare Other | Admitting: Family Medicine

## 2021-04-08 VITALS — BP 128/86 | HR 76 | Ht 65.0 in | Wt 347.6 lb

## 2021-04-08 DIAGNOSIS — I739 Peripheral vascular disease, unspecified: Secondary | ICD-10-CM

## 2021-04-08 DIAGNOSIS — I5022 Chronic systolic (congestive) heart failure: Secondary | ICD-10-CM | POA: Diagnosis not present

## 2021-04-08 DIAGNOSIS — I2510 Atherosclerotic heart disease of native coronary artery without angina pectoris: Secondary | ICD-10-CM

## 2021-04-08 NOTE — Patient Instructions (Signed)
Medication Instructions:  Continue all current medications.  Labwork: none  Testing/Procedures: none  Follow-Up: 3 months   Any Other Special Instructions Will Be Listed Below (If Applicable).  If you need a refill on your cardiac medications before your next appointment, please call your pharmacy.  

## 2021-04-21 DIAGNOSIS — L02212 Cutaneous abscess of back [any part, except buttock]: Secondary | ICD-10-CM | POA: Diagnosis not present

## 2021-04-21 DIAGNOSIS — E1121 Type 2 diabetes mellitus with diabetic nephropathy: Secondary | ICD-10-CM | POA: Diagnosis not present

## 2021-04-21 DIAGNOSIS — E038 Other specified hypothyroidism: Secondary | ICD-10-CM | POA: Diagnosis not present

## 2021-04-21 DIAGNOSIS — I5022 Chronic systolic (congestive) heart failure: Secondary | ICD-10-CM | POA: Diagnosis not present

## 2021-04-21 DIAGNOSIS — I10 Essential (primary) hypertension: Secondary | ICD-10-CM | POA: Diagnosis not present

## 2021-05-05 ENCOUNTER — Other Ambulatory Visit: Payer: Self-pay | Admitting: Cardiology

## 2021-05-12 DIAGNOSIS — H5213 Myopia, bilateral: Secondary | ICD-10-CM | POA: Diagnosis not present

## 2021-05-12 DIAGNOSIS — H401133 Primary open-angle glaucoma, bilateral, severe stage: Secondary | ICD-10-CM | POA: Diagnosis not present

## 2021-07-07 ENCOUNTER — Other Ambulatory Visit: Payer: Self-pay | Admitting: Physician Assistant

## 2021-07-07 ENCOUNTER — Other Ambulatory Visit: Payer: Self-pay | Admitting: Cardiology

## 2021-07-08 ENCOUNTER — Ambulatory Visit: Payer: Medicare Other | Admitting: Family Medicine

## 2021-07-18 ENCOUNTER — Other Ambulatory Visit: Payer: Self-pay | Admitting: Cardiology

## 2021-07-21 DIAGNOSIS — H401133 Primary open-angle glaucoma, bilateral, severe stage: Secondary | ICD-10-CM | POA: Diagnosis not present

## 2021-08-02 ENCOUNTER — Other Ambulatory Visit: Payer: Self-pay | Admitting: Cardiology

## 2021-08-04 DIAGNOSIS — I5022 Chronic systolic (congestive) heart failure: Secondary | ICD-10-CM | POA: Diagnosis not present

## 2021-08-04 DIAGNOSIS — J4520 Mild intermittent asthma, uncomplicated: Secondary | ICD-10-CM | POA: Diagnosis not present

## 2021-08-04 DIAGNOSIS — E038 Other specified hypothyroidism: Secondary | ICD-10-CM | POA: Diagnosis not present

## 2021-08-04 DIAGNOSIS — E1121 Type 2 diabetes mellitus with diabetic nephropathy: Secondary | ICD-10-CM | POA: Diagnosis not present

## 2021-08-04 DIAGNOSIS — I10 Essential (primary) hypertension: Secondary | ICD-10-CM | POA: Diagnosis not present

## 2021-08-04 NOTE — Progress Notes (Signed)
Cardiology Office Note  Date: 08/05/2021   ID: Maureen Jackson, DOB 21-Dec-1955, MRN [id number]  PCP:  Neale Burly, MD  Cardiologist:  Carlyle Dolly, MD Electrophysiologist:  None    Chief Complaint:  3 month follow up  History of Present Illness: Maureen Jackson is a 65 y.o. female with a history of chronic systolic heart failure, CAD, DM 2, HTN, LBBB, morbid obesity, OSA, thyroid disease, dyspnea.  Last seen by Dr. Harl Bowie 11/10/2020.  She had experienced some recent weight gain over the prior several months but no signs of fluid overload or symptoms.  She reported poor dietary habits with regard to calorie intake.  Carvedilol was increased to 12.5 mg p.o. twice daily daily.  Repeat labs were ordered.  Dr. Harl Bowie stated would consider restarting low-dose Aldactone pending labs.  Patient had recent ABIs with possible distal disease despite normal ABI numbers.  She reported no anginal or exertional symptoms.  She was continuing her current medications.  She was last here for 42-month follow-up.  She denied any issues in the interim since last follow-up.  She was having hard time trying to exercise and lose weight.  He had gained some weight since last visit current weight is 347.  In December 2021 on her weight was 343. She was started back on her spironolactone after stable labs at last follow-up.  He denied any significant anginal or exertional symptoms.  Stated her weight causes her to have some lower back pain.  She any significant shortness of breath, or lower extremity edema.  No signs of fluid overload.  She stated she had not been eating well.  She denied any CVA or TIA-like symptoms, orthostatic symptoms, palpitations or arrhythmias, orthostatic symptoms, PND, orthopnea.  Denies any bleeding.  She stated walking short distances such as walking in [place] called lower leg pain and she has to use the electronic scooters to get around in [place].  She is here for 41-month follow-up  today.  She states she recently saw her primary care provider who started her on an inhaler for wheezing and started her on an analgesic for back pain.  She states her recent thyroid labs were abnormal and PCP is planning to adjust her thyroid medication.  She states she is not sure if the thyroid numbers were high or low.  She continues to have issues with trying to lose weight.  She states she is trying to lose weight but she has a lot of back pain which limits her.  Her blood pressure is reasonably well controlled today.  She denies any anginal symptoms.  Does have some exertional DOE.  She states her cholesterol lab work and kidney function lab work at PCP office looked good per her statement.  Her current cardiac regimen includes; amlodipine 5 mg daily, aspirin 81 mg daily, atorvastatin 81 mg daily, carvedilol 12.5 mg p.o. twice daily, Plavix 75 mg daily, Entresto 97/103 p.o. twice daily, Lasix 40 mg p.o. twice daily.  Spironolactone 12.5 mg p.o. daily.  She has gained about 5 pounds since previous check on 04/08/2021.   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 multivessel CAD by cath in 09/2017 and not felt to be a CABG candidate --> underwent two-vessel PCI with  DES to the LAD and DES to the RCA   Diabetes mellitus    Dyspnea    Glaucoma    Hypertension    Left bundle branch block    Morbid obesity ([place])    Obstructive sleep apnea    does not wear CPAP   Thyroid disease     Past Surgical History:  Procedure Laterality Date   ABDOMINAL HYSTERECTOMY     CARDIAC CATHETERIZATION  09/12/2017   CORONARY STENT INTERVENTION  09/12/2017   STENT RESOLUTE ONYX 2.99M42 drug eluting stent was successfully placed   CORONARY STENT INTERVENTION N/A 09/12/2017   Procedure: CORONARY STENT INTERVENTION;  Surgeon: Leonie Man, MD;  Location: [facility];  Service: Cardiovascular;  Laterality: N/A;   INCISION AND DRAINAGE PERIRECTAL ABSCESS Left 09/16/2019   Procedure: IRRIGATION AND DEBRIDEMENT LABIA ABSCESS;  Surgeon: Coralie Keens, MD;  Location: [facility];  Service: General;  Laterality: Left;   IR FLUORO GUIDE CV LINE RIGHT  09/16/2019   IR US GUIDE VASC ACCESS RIGHT  09/16/2019   LEFT HEART CATH AND CORONARY ANGIOGRAPHY N/A 09/12/2017   Procedure: LEFT HEART CATH AND CORONARY ANGIOGRAPHY;  Surgeon: Leonie Man, MD;  Location: [facility];  Service: Cardiovascular;  Laterality: N/A;   MASS EXCISION N/A 12/18/2018   Procedure: EXCISION TONGUE MASS;  Surgeon: Leta Baptist, MD;  Location: [facility];  Service: ENT;  Laterality: N/A;   RIGHT/LEFT HEART CATH AND CORONARY ANGIOGRAPHY N/A 09/10/2017   Procedure: RIGHT/LEFT HEART CATH AND CORONARY ANGIOGRAPHY;  Surgeon: Troy Sine, MD;  Location: [facility];  Service: Cardiovascular;  Laterality: N/A;   THYROID SURGERY      Current Outpatient Medications  Medication Sig Dispense Refill   acetaminophen (TYLENOL) 325 MG tablet Take 2 tablets (650 mg total) by mouth every 4 (four) hours as needed for mild pain, fever or headache. 12 tablet 0   amLODipine (NORVASC) 5 MG tablet TAKE 1 TABLET(5 MG) BY MOUTH DAILY 90 tablet 3   aspirin EC 81 MG tablet Take 1 tablet (81 mg total) by mouth daily with breakfast. 30 tablet 11   atorvastatin (LIPITOR) 80 MG tablet TAKE 1 TABLET(80 MG) BY MOUTH DAILY 30 tablet 2   calcium carbonate (TUMS - DOSED IN MG ELEMENTAL CALCIUM) 500 MG chewable tablet Chew 4 tablets by mouth daily.     carvedilol (COREG) 12.5 MG tablet TAKE 1 TABLET(12.5 MG) BY MOUTH TWICE DAILY 180 tablet 1   cetirizine (ZYRTEC) 10 MG tablet Take 10 mg by mouth daily as needed for allergies.   0   clopidogrel (PLAVIX) 75 MG tablet TAKE 1 TABLET(75 MG) BY MOUTH DAILY 90 tablet 1   ENTRESTO 97-103 MG TAKE 1 TABLET BY MOUTH TWICE DAILY 180 tablet 2    furosemide (LASIX) 40 MG tablet Take 1 tablet (40 mg total) by mouth 2 (two) times daily. 60 tablet 1   levothyroxine (SYNTHROID) 200 MCG tablet Take 1 tablet (200 mcg total) by mouth daily before breakfast. Take BEFORE Breakfast 30 tablet 3   NOVOLOG FLEXPEN 100 UNIT/ML FlexPen Inject 18 Units into the skin 3 (three) times daily with meals. 15 mL 1   senna-docusate (SENOKOT-S) 8.6-50 MG tablet Take 2 tablets by mouth 2 (two) times daily. (Patient taking differently: Take 2 tablets by mouth daily as needed for mild constipation or moderate constipation.) 60 tablet 3   spironolactone (ALDACTONE) 25 MG tablet Take 0.5 tablets (12.5 mg total) by mouth daily. 45 tablet  1   timolol (TIMOPTIC) 0.5 % ophthalmic solution Place 1 drop into both eyes 2 (two) times daily.  5   TRESIBA FLEXTOUCH 100 UNIT/ML SOPN FlexTouch Pen Inject 60 Units into the skin at bedtime.      VENTOLIN HFA 108 (90 Base) MCG/ACT inhaler Inhale 1-2 puffs into the lungs every 6 (six) hours as needed for wheezing or shortness of breath. 18 g 1   doxycycline (VIBRAMYCIN) 100 MG capsule Take 1 capsule (100 mg total) by mouth 2 (two) times daily. (Patient not taking: Reported on 08/05/2021) 20 capsule 0   No current facility-administered medications for this visit.   Allergies:  Ampicillin, Aspirin, Hydrocodone, and Unasyn [ampicillin-sulbactam sodium]   Social History: The patient  reports that she quit smoking about 15 years ago. She has never used smokeless tobacco. She reports that she does not drink alcohol and does not use drugs.   Family History: The patient's family history includes Cancer in her mother; Diabetes in her mother; Hypertension in her mother and sister.   ROS:  Please see the history of present illness. Otherwise, complete review of systems is positive for none.  All other systems are reviewed and negative.   Physical Exam: VS:  BP 138/84   Pulse 77   Ht 5\' 5"  (1.651 m)   Wt (!) 352 lb 6.4 oz (159.8 kg)   SpO2  95%   BMI 58.64 kg/m , BMI Body mass index is 58.64 kg/m.  Wt Readings from Last 3 Encounters:  08/05/21 (!) 352 lb 6.4 oz (159.8 kg)  04/08/21 (!) 347 lb 9.6 oz (157.7 kg)  12/11/20 (!) 315 lb (142.9 kg)    General: Severely morbidly obese patient appears comfortable at rest. Neck: Supple, no elevated JVP or carotid bruits, no thyromegaly. Lungs: Clear to auscultation, nonlabored breathing at rest. Cardiac: Regular rate and rhythm, no S3 or significant systolic murmur, no pericardial rub. Extremities: No pitting edema, distal pulses 2+. Skin: Warm and dry. Musculoskeletal: No kyphosis. Neuropsychiatric: Alert and oriented x3, affect grossly appropriate.  ECG:  EKG December 11, 2020 normal sinus rhythm left bundle branch block rate of 95  Recent Labwork: 11/11/2020: Magnesium 2.1 12/11/2020: B Natriuretic Peptide 71.0; BUN 21; Creatinine, Ser 1.16; Hemoglobin 10.9; Platelets 234; Potassium 3.8; Sodium 138     Component Value Date/Time   CHOL 147 07/17/2018 0152   TRIG 124 12/26/2019 1546   HDL 31 (L) 07/17/2018 0152   CHOLHDL 4.7 07/17/2018 0152   VLDL 33 07/17/2018 0152   LDLCALC 83 07/17/2018 0152    Other Studies Reviewed Today:  Echocardiogram 05/18/2020  1. Images are limited. Suggest Definity contrast when adequate IV access available. 2. Left ventricular ejection fraction, by estimation, is approximately 30%. The left ventricle has severely decreased function. Left ventricular endocardial border not optimally defined to evaluate regional wall motion. There is moderate left ventricular hypertrophy. Left ventricular diastolic parameters are indeterminate. 3. Right ventricular systolic function is normal. The right ventricular size is normal. 4. Moderate pericardial effusion. The pericardial effusion is circumferential. 5. The mitral valve is grossly normal. Trivial mitral valve regurgitation. 6. The aortic valve is tricuspid. Aortic valve regurgitation is not  visualized. 7. The inferior vena cava is normal in size with greater than 50% respiratory variability, suggesting right atrial pressure of 3 mmHg.   Lower extremity arterial duplex 05/24/2020 IMPRESSION: While the ABIs are normal, limited distal evaluation is abnormal with right posterior tibial occlusion and abnormal distal left lower extremity arterial waveforms, suggesting possible  medial calcifications artificially elevated the ABIs and underestimates severity of lower extremity arterial occlusive disease.   Should the patient fail conservative treatment, consider MRA runoff (not affected by vascular calcifications, no radiation risk, can be performed noncontrast in the setting of renal dysfunction) to better define the site and nature of arterial occlusive disease and delineate treatment options. FINDINGS: Right ABI:  1.11   Left ABI:  1.24   Right Lower Extremity: Normal dorsalis pedis waveform. No flow identified in posterior tibial artery.   Left Lower Extremity:  Monophasic waveforms distally.  09/11/2017  CORONARY STENT INTERVENTION  LEFT HEART CATH AND CORONARY ANGIOGRAPHY    Conclusion    LESION #1 Prox LAD lesion, 60 %stenosed. Prox LAD to Mid LAD lesion, 65 %stenosed. Mid LAD lesion, 85 %stenosed. Tandem Lesions. A STENT RESOLUTE ONYX [id number] drug eluting stent was successfully placed. Post-dilated in tapered fashion: 3.6 - 2.9 mm Post intervention, there is a 0% residual stenosis. LESION $2 Prox RCA lesion, 45 %stenosed. Prox RCA to Mid RCA lesion, 70 %stenosed. Mid RCA lesion, 90 %stenosed. Tandem Lesions A STENT RESOLUTE ONYX [id number] drug eluting stent and STENT RESOLUTE ONYX 2.75X26 drug-eluting stent were successfully placed in overlapping fashion. Post intervention, there is a 0% residual stenosis. Post-dilated in tapered fashion: 3.6 - 3.1 mm RPDA lesion, 80 %stenosed. Plan Medical management.   Successful 2 vessel PCI with stable circumflex  disease. Plan is to treat circumflex flex disease medically for now.     Plan: Return to nursing unit for post PCI care TR band removal. Continue to optimize medical management, but from a post Standpoint she will be able to be discharged tomorrow. Continue aspirin plus Plavix for minimum one year, but would continue Plavix lifelong      Assessment and Plan:  1. Chronic systolic heart failure ([place])   2. CAD in native artery   3. Essential hypertension     1. Chronic systolic heart failure [facility]) Last echocardiogram June 2021 showed EF of 30%, moderate LVH moderate pericardial effusion circumferential.  Trivial MR.  She denies any significant shortness of breath.  Has some mild DOE.  She has gained some weight which she attributes to dietary indiscretions.  No significant lower extremity edema.  Continue carvedilol 12.5 mg p.o. twice daily.  Continue Entresto 97/103 mg p.o. twice daily.  Continue Lasix 40 mg twice daily.  Continue spironolactone 12.5 mg p.o. daily.  Please get a follow-up echocardiogram in late November or early December to reevaluate LV function, diastolic function and valvular function.  2. CAD in native artery Currently denies any anginal or exertional symptoms.  Continue aspirin 81 mg p.o. daily.  Continue Plavix 75 mg p.o. daily.  Continue carvedilol 12.5 mg p.o. twice daily.  Continue atorvastatin 80 mg p.o. daily.  3.  Essential hypertension Blood pressure today 138/84.  Continue amlodipine 5 mg daily.  Continue carvedilol 12.5 mg p.o. twice daily.  Continue furosemide 40 mg p.o. twice daily.  Continue spironolactone 12.5 mg p.o. daily.  Medication Adjustments/Labs and Tests Ordered: Current medicines are reviewed at length with the patient today.  Concerns regarding medicines are outlined above.   Disposition: Follow-up with Dr. Harl Bowie or APP 6 months  Signed, Levell July, NP 08/05/2021 11:11 AM    [facility] [place], [place], [place] [address] Phone: [phone]; Fax: [phone]

## 2021-08-05 ENCOUNTER — Ambulatory Visit (INDEPENDENT_AMBULATORY_CARE_PROVIDER_SITE_OTHER): Payer: Medicare Other | Admitting: Family Medicine

## 2021-08-05 ENCOUNTER — Encounter: Payer: Self-pay | Admitting: Family Medicine

## 2021-08-05 ENCOUNTER — Other Ambulatory Visit: Payer: Self-pay

## 2021-08-05 VITALS — BP 138/84 | HR 77 | Ht 65.0 in | Wt 352.4 lb

## 2021-08-05 DIAGNOSIS — I10 Essential (primary) hypertension: Secondary | ICD-10-CM | POA: Diagnosis not present

## 2021-08-05 DIAGNOSIS — I2510 Atherosclerotic heart disease of native coronary artery without angina pectoris: Secondary | ICD-10-CM | POA: Diagnosis not present

## 2021-08-05 DIAGNOSIS — I739 Peripheral vascular disease, unspecified: Secondary | ICD-10-CM

## 2021-08-05 DIAGNOSIS — I5022 Chronic systolic (congestive) heart failure: Secondary | ICD-10-CM

## 2021-08-05 NOTE — Patient Instructions (Signed)
Medication Instructions:  Continue all current medications.   Labwork: none  Testing/Procedures: Your physician has requested that you have an echocardiogram. Echocardiography is a painless test that uses sound waves to create images of your heart. It provides your doctor with information about the size and shape of your heart and how well your heart's chambers and valves are working. This procedure takes approximately one hour. There are no restrictions for this procedure - DUE LATE NOVEMBER OR EARLY DECEMBER  Follow-Up: 6 months   Any Other Special Instructions Will Be Listed Below (If Applicable).    If you need a refill on your cardiac medications before your next appointment, please call your pharmacy.

## 2021-10-15 ENCOUNTER — Other Ambulatory Visit: Payer: Self-pay | Admitting: Cardiology

## 2021-10-28 ENCOUNTER — Other Ambulatory Visit: Payer: Self-pay | Admitting: Cardiology

## 2021-11-07 DIAGNOSIS — E1121 Type 2 diabetes mellitus with diabetic nephropathy: Secondary | ICD-10-CM | POA: Diagnosis not present

## 2021-11-07 DIAGNOSIS — I5022 Chronic systolic (congestive) heart failure: Secondary | ICD-10-CM | POA: Diagnosis not present

## 2021-11-07 DIAGNOSIS — J4520 Mild intermittent asthma, uncomplicated: Secondary | ICD-10-CM | POA: Diagnosis not present

## 2021-11-07 DIAGNOSIS — I10 Essential (primary) hypertension: Secondary | ICD-10-CM | POA: Diagnosis not present

## 2021-11-07 DIAGNOSIS — Z0000 Encounter for general adult medical examination without abnormal findings: Secondary | ICD-10-CM | POA: Diagnosis not present

## 2021-11-07 DIAGNOSIS — E038 Other specified hypothyroidism: Secondary | ICD-10-CM | POA: Diagnosis not present

## 2021-11-09 ENCOUNTER — Encounter: Payer: Self-pay | Admitting: Gastroenterology

## 2021-11-10 ENCOUNTER — Other Ambulatory Visit: Payer: Medicare Other

## 2021-12-08 ENCOUNTER — Telehealth: Payer: Self-pay | Admitting: "Endocrinology

## 2021-12-08 NOTE — Telephone Encounter (Signed)
Closed referral from Dr Joselyn Arrow office. Called pt 12/9 left a VM, called pt 12/15 and left a VM.

## 2022-01-09 ENCOUNTER — Ambulatory Visit: Payer: Self-pay | Admitting: "Endocrinology

## 2022-01-12 ENCOUNTER — Other Ambulatory Visit: Payer: Medicare Other

## 2022-01-23 ENCOUNTER — Ambulatory Visit: Payer: Self-pay | Admitting: "Endocrinology

## 2022-01-25 ENCOUNTER — Other Ambulatory Visit: Payer: Self-pay | Admitting: Cardiology

## 2022-01-31 ENCOUNTER — Ambulatory Visit: Payer: Self-pay | Admitting: "Endocrinology

## 2022-02-06 ENCOUNTER — Other Ambulatory Visit: Payer: Self-pay

## 2022-02-06 ENCOUNTER — Encounter (HOSPITAL_COMMUNITY): Payer: Self-pay

## 2022-02-06 ENCOUNTER — Emergency Department (HOSPITAL_COMMUNITY): Payer: Medicare Other

## 2022-02-06 ENCOUNTER — Emergency Department (HOSPITAL_COMMUNITY)
Admission: EM | Admit: 2022-02-06 | Discharge: 2022-02-06 | Disposition: A | Discharge status: Home / Self Care | Payer: Medicare Other | Attending: Emergency Medicine | Admitting: Emergency Medicine

## 2022-02-06 DIAGNOSIS — Z79899 Other long term (current) drug therapy: Secondary | ICD-10-CM | POA: Diagnosis not present

## 2022-02-06 DIAGNOSIS — R079 Chest pain, unspecified: Secondary | ICD-10-CM

## 2022-02-06 DIAGNOSIS — R0789 Other chest pain: Secondary | ICD-10-CM | POA: Insufficient documentation

## 2022-02-06 DIAGNOSIS — Z7982 Long term (current) use of aspirin: Secondary | ICD-10-CM | POA: Insufficient documentation

## 2022-02-06 DIAGNOSIS — R0602 Shortness of breath: Secondary | ICD-10-CM | POA: Insufficient documentation

## 2022-02-06 DIAGNOSIS — J811 Chronic pulmonary edema: Secondary | ICD-10-CM | POA: Diagnosis not present

## 2022-02-06 DIAGNOSIS — I517 Cardiomegaly: Secondary | ICD-10-CM | POA: Diagnosis not present

## 2022-02-06 LAB — CBC
HCT: 35.2 % — ABNORMAL LOW (ref 36.0–46.0)
Hemoglobin: 11 g/dL — ABNORMAL LOW (ref 12.0–15.0)
MCH: 27.7 pg (ref 26.0–34.0)
MCHC: 31.3 g/dL (ref 30.0–36.0)
MCV: 88.7 fL (ref 80.0–100.0)
Platelets: 242 10*3/uL (ref 150–400)
RBC: 3.97 MIL/uL (ref 3.87–5.11)
RDW: 14.7 % (ref 11.5–15.5)
WBC: 7.4 10*3/uL (ref 4.0–10.5)
nRBC: 0 % (ref 0.0–0.2)

## 2022-02-06 LAB — BASIC METABOLIC PANEL
Anion gap: 9 (ref 5–15)
BUN: 19 mg/dL (ref 8–23)
CO2: 27 mmol/L (ref 22–32)
Calcium: 8.1 mg/dL — ABNORMAL LOW (ref 8.9–10.3)
Chloride: 104 mmol/L (ref 98–111)
Creatinine, Ser: 1.16 mg/dL — ABNORMAL HIGH (ref 0.44–1.00)
GFR, Estimated: 53 mL/min — ABNORMAL LOW (ref >60)
Glucose, Bld: 213 mg/dL — ABNORMAL HIGH (ref 70–99)
Potassium: 4 mmol/L (ref 3.5–5.1)
Sodium: 140 mmol/L (ref 135–145)

## 2022-02-06 LAB — TROPONIN I (HIGH SENSITIVITY)
Troponin I (High Sensitivity): 11 ng/L (ref <18)
Troponin I (High Sensitivity): 11 ng/L (ref <18)

## 2022-02-06 MED ORDER — FUROSEMIDE 40 MG PO TABS
40.0000 mg | ORAL_TABLET | Freq: Once | ORAL | Status: DC
  Filled 2022-02-06: qty 1

## 2022-02-06 NOTE — ED Provider Notes (Signed)
[facility] EMERGENCY DEPARTMENT Provider Note   CSN: [id number] Arrival date & time: 02/06/22  1729     History  Chief Complaint  Patient presents with   Chest Pain    Maureen Jackson is a 66 y.o. female.  Presents with chief complaint of chest pain.  Describes mid chest tightness since yesterday, describes it as persistent and associate with shortness of breath on exertional activities.  Denies any fevers or cough or vomiting or diarrhea.  She has a history of heart disease and congestive heart failure on Lasix.      Home Medications Prior to Admission medications   Medication Sig Start Date End Date Taking? Authorizing Provider  acetaminophen (TYLENOL) 325 MG tablet Take 2 tablets (650 mg total) by mouth every 4 (four) hours as needed for mild pain, fever or headache. 05/25/20   Denton Brick, Courage, MD  amLODipine (NORVASC) 5 MG tablet TAKE 1 TABLET(5 MG) BY MOUTH DAILY 07/07/21   [name], PA-C  aspirin EC 81 MG tablet Take 1 tablet (81 mg total) by mouth daily with breakfast. 05/25/20   Denton Brick, Courage, MD  atorvastatin (LIPITOR) 80 MG tablet TAKE 1 TABLET(80 MG) BY MOUTH DAILY 01/25/22   Arnoldo Lenis, MD  calcium carbonate (TUMS - DOSED IN MG ELEMENTAL CALCIUM) 500 MG chewable tablet Chew 4 tablets by mouth daily.    [provider]  carvedilol (COREG) 12.5 MG tablet TAKE 1 TABLET(12.5 MG) BY MOUTH TWICE DAILY 10/17/21   Arnoldo Lenis, MD  cetirizine (ZYRTEC) 10 MG tablet Take 10 mg by mouth daily as needed for allergies.  09/12/18   [provider]  clopidogrel (PLAVIX) 75 MG tablet TAKE 1 TABLET(75 MG) BY MOUTH DAILY 07/07/21   Arnoldo Lenis, MD  doxycycline (VIBRAMYCIN) 100 MG capsule Take 1 capsule (100 mg total) by mouth 2 (two) times daily. Patient not taking: Reported on 08/05/2021 10/04/20   Lorin Glass, PA-C  ENTRESTO 97-103 MG TAKE 1 TABLET BY MOUTH TWICE DAILY 07/18/21   Arnoldo Lenis, MD  furosemide (LASIX) 40 MG tablet  Take 1 tablet (40 mg total) by mouth 2 (two) times daily. 05/25/20   Roxan Hockey, MD  levothyroxine (SYNTHROID) 200 MCG tablet Take 1 tablet (200 mcg total) by mouth daily before breakfast. Take BEFORE Breakfast 05/26/20   Emokpae, Courage, MD  NOVOLOG FLEXPEN 100 UNIT/ML FlexPen Inject 18 Units into the skin 3 (three) times daily with meals. 12/31/19   Caren Griffins, MD  senna-docusate (SENOKOT-S) 8.6-50 MG tablet Take 2 tablets by mouth 2 (two) times daily. Patient taking differently: Take 2 tablets by mouth daily as needed for mild constipation or moderate constipation. 05/25/20   Roxan Hockey, MD  spironolactone (ALDACTONE) 25 MG tablet Take 0.5 tablets (12.5 mg total) by mouth daily. 11/19/20 08/05/22  Arnoldo Lenis, MD  timolol (TIMOPTIC) 0.5 % ophthalmic solution Place 1 drop into both eyes 2 (two) times daily. 10/07/18   [provider]  TRESIBA FLEXTOUCH 100 UNIT/ML SOPN FlexTouch Pen Inject 60 Units into the skin at bedtime.  10/08/19   [provider]  VENTOLIN HFA 108 (90 Base) MCG/ACT inhaler Inhale 1-2 puffs into the lungs every 6 (six) hours as needed for wheezing or shortness of breath. 05/25/20   Roxan Hockey, MD      Allergies    Ampicillin, Aspirin, Hydrocodone, and Unasyn [ampicillin-sulbactam sodium]    Review of Systems   Review of Systems  Constitutional:  Negative for fever.  HENT:  Negative for ear pain.   Eyes:  Negative for pain.  Respiratory:  Positive for shortness of breath. Negative for cough.   Cardiovascular:  Positive for chest pain.  Gastrointestinal:  Negative for abdominal pain.  Genitourinary:  Negative for flank pain.  Musculoskeletal:  Negative for back pain.  Skin:  Negative for rash.  Neurological:  Negative for headaches.   Physical Exam Updated Vital Signs BP (!) 145/89    Pulse 70    Temp 97.8 F (36.6 C) (Oral)    Resp 17    Ht 5\' 5"  (1.651 m)    Wt (!) 162.4 kg    SpO2 94%    BMI 59.57 kg/m  Physical  Exam Constitutional:      General: She is not in acute distress.    Appearance: Normal appearance.  HENT:     Head: Normocephalic.     Nose: Nose normal.  Eyes:     Extraocular Movements: Extraocular movements intact.  Cardiovascular:     Rate and Rhythm: Normal rate.  Pulmonary:     Effort: Pulmonary effort is normal. No respiratory distress.     Breath sounds: No stridor. No wheezing.  Musculoskeletal:        General: Normal range of motion.     Cervical back: Normal range of motion.     Right lower leg: No edema.     Left lower leg: No edema.  Neurological:     General: No focal deficit present.     Mental Status: She is alert. Mental status is at baseline.    ED Results / Procedures / Treatments   Labs (all labs ordered are listed, but only abnormal results are displayed) Labs Reviewed  BASIC METABOLIC PANEL - Abnormal; Notable for the following components:      Result Value   Glucose, Bld 213 (*)    Creatinine, Ser 1.16 (*)    Calcium 8.1 (*)    GFR, Estimated 53 (*)    All other components within normal limits  CBC - Abnormal; Notable for the following components:   Hemoglobin 11.0 (*)    HCT 35.2 (*)    All other components within normal limits  TROPONIN I (HIGH SENSITIVITY)  TROPONIN I (HIGH SENSITIVITY)    EKG EKG Interpretation  Date/Time:  Monday February 06 2022 17:51:12 EST Ventricular Rate:  83 PR Interval:  154 QRS Duration: 152 QT Interval:  436 QTC Calculation: 512 R Axis:   31 Text Interpretation: Normal sinus rhythm Left bundle branch block Abnormal ECG When compared with ECG of 11-Dec-2020 15:32, T wave inversion no longer evident in Inferior leads Nonspecific T wave abnormality now evident in Lateral leads Confirmed by Thamas Jaegers ([id number]) on 02/06/2022 7:27:55 PM  Radiology DG Chest 2 View  Result Date: 02/06/2022 CLINICAL DATA:  Shortness of breath. EXAM: CHEST - 2 VIEW COMPARISON:  December 11, 2020. FINDINGS: Stable cardiomegaly. Mild  central pulmonary vascular congestion is noted. Possible mild bibasilar pulmonary edema is noted. Bony thorax is unremarkable. IMPRESSION: Stable cardiomegaly with mild central pulmonary vascular congestion and possible bibasilar pulmonary edema. Electronically Signed   By: Marijo Conception M.D.   On: 02/06/2022 18:08    Procedures Procedures    Medications Ordered in ED Medications  furosemide (LASIX) tablet 40 mg (40 mg Oral Patient Refused/Not Given 02/06/22 2239)    ED Course/ Medical Decision Making/ A&P  Medical Decision Making Amount and/or Complexity of Data Reviewed Labs: ordered. Radiology: ordered.   Review of record shows telephone visit with her doctors November 29, 2021.  This patient presents to the ED for concern of pain, shortness of breath this involves an extensive number of treatment options, and is a complaint that carries with it a high risk of complications and morbidity.  The differential diagnosis includes pneumonia, pneumothorax, acute coronary syndrome, among others.     Co morbidities that complicate the patient evaluation   Obesity, heart disease        Lab Tests:   I Ordered, and personally interpreted labs.  The pertinent results include: CBC CMP troponin.  These are unremarkable troponin is flat and normal.     Imaging Studies ordered:   Chest x-ray, concerning for pulmonary edema.     Cardiac Monitoring:   Continued on cardiac monitor, normal sinus rhythm.   EKG shows sinus rhythm no ST elevations no T wave inversions noted.  The bundle branch block pattern unchanged from prior EKGs.      Chest x-ray concerning for developing pulmonary edema.  Patient on repeat evaluation stating that she feels much better denies any chest pain or shortness of breath at this time Symptoms appear to have resolved.  She has an appointment with her cardiologist in 3 days which advised her to keep.  Advised her to return  immediately back to the ER if she has worsening symptoms chest pain difficulty breathing or any additional concerns.  Otherwise advised her to double up her Lasix in the morning until she sees her cardiologist in 3 days.            Final Clinical Impression(s) / ED Diagnoses Final diagnoses:  Nonspecific chest pain    Rx / DC Orders ED Discharge Orders     None         Luna Fuse, MD 02/06/22 2240

## 2022-02-06 NOTE — ED Triage Notes (Signed)
Pt presents to ED with complaints of mid chest tightness and shortness of breath started yesterday

## 2022-02-06 NOTE — Discharge Instructions (Signed)
Follow-up with your cardiologist as scheduled in 3 days.  In the meanwhile double your Lasix in the morning, take your regular dose at nighttime.  While awaiting your cardiologist, return immediately back to the ER if you have recurrent chest pain trouble breathing or any additional concerns.  Avoid strenuous activity until cleared by your cardiologist.  Return immediately back to the ER if:  Your symptoms worsen within the next 12-24 hours. You develop new symptoms such as new fevers, persistent vomiting, new pain, shortness of breath, or new weakness or numbness, or if you have any other concerns.

## 2022-02-06 NOTE — ED Notes (Signed)
Update given to pt's daughter, [name].

## 2022-02-07 ENCOUNTER — Other Ambulatory Visit: Payer: Medicare Other

## 2022-02-09 ENCOUNTER — Encounter: Payer: Self-pay | Admitting: Cardiology

## 2022-02-09 ENCOUNTER — Ambulatory Visit (INDEPENDENT_AMBULATORY_CARE_PROVIDER_SITE_OTHER): Payer: Medicare Other | Admitting: Cardiology

## 2022-02-09 VITALS — BP 128/78 | HR 72 | Ht 65.0 in | Wt 360.0 lb

## 2022-02-09 DIAGNOSIS — I2510 Atherosclerotic heart disease of native coronary artery without angina pectoris: Secondary | ICD-10-CM | POA: Diagnosis not present

## 2022-02-09 DIAGNOSIS — I5033 Acute on chronic diastolic (congestive) heart failure: Secondary | ICD-10-CM

## 2022-02-09 DIAGNOSIS — I10 Essential (primary) hypertension: Secondary | ICD-10-CM

## 2022-02-09 MED ORDER — FUROSEMIDE 40 MG PO TABS: 60.0000 mg | ORAL_TABLET | Freq: Two times a day (BID) | ORAL | 3 refills | Status: DC

## 2022-02-09 MED ORDER — SPIRONOLACTONE 25 MG PO TABS: 12.5000 mg | ORAL_TABLET | Freq: Every day | ORAL | 3 refills | Status: DC

## 2022-02-09 NOTE — Patient Instructions (Signed)
Medication Instructions:  ?Increase Lasix to 60mg  twice a day   ?Restart Spironolactone at 12.5mg  daily  ?Continue all other medications.    ? ?Labwork: ?BMET, Mg, BNP - orders given today ?Please do in 2 weeks (around 02/23/2022) ?Office will contact with results via phone or letter.    ? ?Testing/Procedures: ?none ? ?Follow-Up: ?1 month  ? ?Any Other Special Instructions Will Be Listed Below (If Applicable). ? ? ?If you need a refill on your cardiac medications before your next appointment, please call your pharmacy. ? ?

## 2022-02-09 NOTE — Progress Notes (Signed)
Clinical Summary Maureen Jackson is a 66 y.o.female   1. Chronic systolic HF/ICM/CAD - patient admitted with acute onset CHF 08/2017.  - echo 08/2017 LVEF 30-35%, grade I diastoilc dysfunction - 09/2017 cath as reported below, found to have severe multivessel disease of LAD, LCX, and RCA. Mean PA 33, PCWP 25, CI 2.7 - seen by CT surgery, recs were for PCI as opposed to CABG. -  Received DES to LAD and DES x 2 to RCA, LCX disease managed medically. Recs for lifelong plavix per interventional cards. Has been on high dose ASA per neurology.  - discharge weight 09/13/17 312 lbs    08/2018 echo limited visually, LVEF 30-40% Jan 2020 echo: LVEF 35-40%, though difficult study even with contrast   05/2020 echo:LVEF 30%, normal RV function, mod pericardial effusion  01/2022 ER visit with chest pain - trop neg x 2 - CXR mild congestion - EKG SR chronic LBBB - burning pain midchest at rest. No other associated symptoms. Lasted a few seconds. Mild pain. Got up to walk to bathroom had some SOB.  - chest pain yesterday that went under arm, better with tums.     -SOB over the last few weeks. Comes on with low levels of exertion - has not some LE edema. No orthopnea - mixed compliance with meds, sometimes forgets. Misses about 1-2 times per week - upcoming March 16 - she reports pcp had stop jardiance, she is unsure.      2. PAD 05/2020 ABI: right 1.11 left 1.24, however limited distal evaluation. Suggested right posterior tibial occlusion and abnormal left distal waveforms - no recent leg pains.         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 multivessel CAD by cath in 09/2017 and not felt to be a CABG candidate --> underwent two-vessel PCI with DES to the LAD and DES to the RCA   Diabetes mellitus     Dyspnea    Glaucoma    Hypertension    Left bundle [place] block    Morbid obesity ([place])    Obstructive sleep apnea    does not wear CPAP   Thyroid disease      Allergies  Allergen Reactions   Ampicillin Other (See Comments)    "Allergic," per [facility]   Aspirin Nausea Only   Hydrocodone Other (See Comments)    "Allergic," per [facility]   Unasyn [Ampicillin-Sulbactam Sodium] Rash and Other (See Comments)    "Allergic," per [facility]     Current Outpatient Medications  Medication Sig Dispense Refill   acetaminophen (TYLENOL) 325 MG tablet Take 2 tablets (650 mg total) by mouth every 4 (four) hours as needed for mild pain, fever or headache. 12 tablet 0   amLODipine (NORVASC) 5 MG tablet TAKE 1 TABLET(5 MG) BY MOUTH DAILY 90 tablet 3   aspirin EC 81 MG tablet Take 1 tablet (81 mg total) by mouth daily with breakfast. 30 tablet 11   atorvastatin (LIPITOR) 80 MG tablet TAKE 1 TABLET(80 MG) BY MOUTH DAILY 30 tablet 2   calcium carbonate (TUMS - DOSED IN MG ELEMENTAL CALCIUM) 500 MG chewable tablet Chew 4 tablets by mouth daily.     carvedilol (COREG) 12.5 MG tablet TAKE 1 TABLET(12.5 MG)  BY MOUTH TWICE DAILY 180 tablet 1   cetirizine (ZYRTEC) 10 MG tablet Take 10 mg by mouth daily as needed for allergies.   0   clopidogrel (PLAVIX) 75 MG tablet TAKE 1 TABLET(75 MG) BY MOUTH DAILY 90 tablet 1   doxycycline (VIBRAMYCIN) 100 MG capsule Take 1 capsule (100 mg total) by mouth 2 (two) times daily. (Patient not taking: Reported on 08/05/2021) 20 capsule 0   ENTRESTO 97-103 MG TAKE 1 TABLET BY MOUTH TWICE DAILY 180 tablet 2   furosemide (LASIX) 40 MG tablet Take 1 tablet (40 mg total) by mouth 2 (two) times daily. 60 tablet 1   levothyroxine (SYNTHROID) 200 MCG tablet Take 1 tablet (200 mcg total) by mouth daily before breakfast. Take BEFORE Breakfast 30 tablet 3   NOVOLOG FLEXPEN 100 UNIT/ML FlexPen Inject 18 Units into the skin 3 (three) times daily with meals. 15 mL 1   senna-docusate (SENOKOT-S) 8.6-50 MG  tablet Take 2 tablets by mouth 2 (two) times daily. (Patient taking differently: Take 2 tablets by mouth daily as needed for mild constipation or moderate constipation.) 60 tablet 3   spironolactone (ALDACTONE) 25 MG tablet Take 0.5 tablets (12.5 mg total) by mouth daily. 45 tablet 1   timolol (TIMOPTIC) 0.5 % ophthalmic solution Place 1 drop into both eyes 2 (two) times daily.  5   TRESIBA FLEXTOUCH 100 UNIT/ML SOPN FlexTouch Pen Inject 60 Units into the skin at bedtime.      VENTOLIN HFA 108 (90 Base) MCG/ACT inhaler Inhale 1-2 puffs into the lungs every 6 (six) hours as needed for wheezing or shortness of breath. 18 g 1   No current facility-administered medications for this visit.     Past Surgical History:  Procedure Laterality Date   ABDOMINAL HYSTERECTOMY     CARDIAC CATHETERIZATION  09/12/2017   CORONARY STENT INTERVENTION  09/12/2017   STENT RESOLUTE ONYX 0.93O67 drug eluting stent was successfully placed   CORONARY STENT INTERVENTION N/A 09/12/2017   Procedure: CORONARY STENT INTERVENTION;  Surgeon: Leonie Man, MD;  Location: [place] [facility];  Service: Cardiovascular;  Laterality: N/A;   INCISION AND DRAINAGE PERIRECTAL ABSCESS Left 09/16/2019   Procedure: IRRIGATION AND DEBRIDEMENT LABIA ABSCESS;  Surgeon: Coralie Keens, MD;  Location: [place];  Service: General;  Laterality: Left;   IR FLUORO GUIDE CV LINE RIGHT  09/16/2019   IR US GUIDE VASC ACCESS RIGHT  09/16/2019   LEFT HEART CATH AND CORONARY ANGIOGRAPHY N/A 09/12/2017   Procedure: LEFT HEART CATH AND CORONARY ANGIOGRAPHY;  Surgeon: Leonie Man, MD;  Location: [place] [facility];  Service: Cardiovascular;  Laterality: N/A;   MASS EXCISION N/A 12/18/2018   Procedure: EXCISION TONGUE MASS;  Surgeon: Leta Baptist, MD;  Location: [place] [facility];  Service: ENT;  Laterality: N/A;   RIGHT/LEFT HEART CATH AND CORONARY ANGIOGRAPHY N/A 09/10/2017   Procedure: RIGHT/LEFT HEART CATH AND CORONARY ANGIOGRAPHY;  Surgeon: Troy Sine, MD;   Location: [place] [facility];  Service: Cardiovascular;  Laterality: N/A;   THYROID SURGERY       Allergies  Allergen Reactions   Ampicillin Other (See Comments)    "Allergic," per [facility]   Aspirin Nausea Only   Hydrocodone Other (See Comments)    "Allergic," per [facility]   Unasyn [Ampicillin-Sulbactam Sodium] Rash and Other (See Comments)    "Allergic," per [facility]      Family History  Problem Relation Age of Onset   Diabetes Mother    Hypertension Mother  Cancer Mother        pancreas   Hypertension Sister      Social History Maureen Jackson reports that she quit smoking about 16 years ago. Her smoking use included cigarettes. She has never used smokeless tobacco. Maureen Jackson reports no history of alcohol use.   Review of Systems CONSTITUTIONAL: No weight loss, fever, chills, weakness or fatigue.  HEENT: Eyes: No visual loss, blurred vision, double vision or yellow sclerae.No hearing loss, sneezing, congestion, runny nose or sore throat.  SKIN: No rash or itching.  CARDIOVASCULAR: per hpi RESPIRATORY: per hpi GASTROINTESTINAL: No anorexia, nausea, vomiting or diarrhea. No abdominal pain or blood.  GENITOURINARY: No burning on urination, no polyuria NEUROLOGICAL: No headache, dizziness, syncope, paralysis, ataxia, numbness or tingling in the extremities. No change in bowel or bladder control.  MUSCULOSKELETAL: No muscle, back pain, joint pain or stiffness.  LYMPHATICS: No enlarged nodes. No history of splenectomy.  PSYCHIATRIC: No history of depression or anxiety.  ENDOCRINOLOGIC: No reports of sweating, cold or heat intolerance. No polyuria or polydipsia.  Marland Kitchen   Physical Examination Today's Vitals   02/09/22 0941  BP: 128/78  Pulse: 72  SpO2: 98%  Weight: (!) 360 lb (163.3 kg)  Height: 5\' 5"  (1.651 m)   Body mass index is 59.91 kg/m.  Gen: resting comfortably, no acute distress HEENT: no scleral icterus, pupils equal round and reactive, no palptable cervical  adenopathy,  CV: RRR, no m/r/g. +JVD Resp: Clear to auscultation bilaterally GI: abdomen is soft, non-tender, non-distended, normal bowel sounds, no hepatosplenomegaly MSK: trace bilateral edema Skin: warm, no rash Neuro:  no focal deficits Psych: appropriate affect   Diagnostic Studies 05/2020 echo IMPRESSIONS     1. Images are limited. Suggest Definity contrast when adequate IV access  available.   2. Left ventricular ejection fraction, by estimation, is approximately  30%. The left ventricle has severely decreased function. Left ventricular  endocardial border not optimally defined to evaluate regional wall motion.  There is moderate left  ventricular hypertrophy. Left ventricular diastolic parameters are  indeterminate.   3. Right ventricular systolic function is normal. The right ventricular  size is normal.   4. Moderate pericardial effusion. The pericardial effusion is  circumferential.   5. The mitral valve is grossly normal. Trivial mitral valve  regurgitation.   6. The aortic valve is tricuspid. Aortic valve regurgitation is not  visualized.   7. The inferior vena cava is normal in size with greater than 50%  respiratory variability, suggesting right atrial pressure of 3 mmHg.      Assessment and Plan   Acute on chronic systolic HF -recent increased SOB/DOE - some signs of fluid overload, will increase her lasix to 60mg  bid - check bmet/mg/bnp in 2 weeks - has upcoming echo in 2 weeks, pending results may need further medication adjustement. She reports had been on jardiance but pcp stopped, she is not sure reason or if possible side effects. Reuqest clinic notes from pcp   2. CAD -recent atypical chest pain better with tums, no indication for ischemic testing at this time.      Arnoldo Lenis, M.D.

## 2022-02-22 ENCOUNTER — Other Ambulatory Visit (HOSPITAL_COMMUNITY)
Admission: RE | Admit: 2022-02-22 | Discharge: 2022-02-22 | Disposition: A | Discharge status: Home / Self Care | Payer: Medicare Other | Source: Ambulatory Visit | Attending: Cardiology | Admitting: Cardiology

## 2022-02-22 DIAGNOSIS — I10 Essential (primary) hypertension: Secondary | ICD-10-CM | POA: Diagnosis not present

## 2022-02-22 DIAGNOSIS — I5033 Acute on chronic diastolic (congestive) heart failure: Secondary | ICD-10-CM | POA: Insufficient documentation

## 2022-02-22 LAB — BASIC METABOLIC PANEL
Anion gap: 8 (ref 5–15)
BUN: 20 mg/dL (ref 8–23)
CO2: 25 mmol/L (ref 22–32)
Calcium: 7.8 mg/dL — ABNORMAL LOW (ref 8.9–10.3)
Chloride: 107 mmol/L (ref 98–111)
Creatinine, Ser: 1.22 mg/dL — ABNORMAL HIGH (ref 0.44–1.00)
GFR, Estimated: 50 mL/min — ABNORMAL LOW (ref >60)
Glucose, Bld: 188 mg/dL — ABNORMAL HIGH (ref 70–99)
Potassium: 4.2 mmol/L (ref 3.5–5.1)
Sodium: 140 mmol/L (ref 135–145)

## 2022-02-22 LAB — BRAIN NATRIURETIC PEPTIDE: B Natriuretic Peptide: 47 pg/mL (ref 0.0–100.0)

## 2022-02-22 LAB — MAGNESIUM: Magnesium: 2.2 mg/dL (ref 1.7–2.4)

## 2022-02-23 ENCOUNTER — Other Ambulatory Visit: Payer: Medicare Other

## 2022-03-07 ENCOUNTER — Telehealth: Payer: Self-pay | Admitting: *Deleted

## 2022-03-07 NOTE — Telephone Encounter (Signed)
Laurine Blazer, LPN  ?7/59/1638  4:66 PM EDT Back to Top  ?  ?Notified, copy to pcp.   ? ?

## 2022-03-07 NOTE — Telephone Encounter (Signed)
-----   Message from Arnoldo Lenis, MD sent at 03/01/2022  3:41 PM EDT ----- ?Normal labs ? ?Zandra Abts MD ?

## 2022-03-13 DIAGNOSIS — J4520 Mild intermittent asthma, uncomplicated: Secondary | ICD-10-CM | POA: Diagnosis not present

## 2022-03-13 DIAGNOSIS — E1121 Type 2 diabetes mellitus with diabetic nephropathy: Secondary | ICD-10-CM | POA: Diagnosis not present

## 2022-03-13 DIAGNOSIS — I10 Essential (primary) hypertension: Secondary | ICD-10-CM | POA: Diagnosis not present

## 2022-03-13 DIAGNOSIS — E038 Other specified hypothyroidism: Secondary | ICD-10-CM | POA: Diagnosis not present

## 2022-03-13 DIAGNOSIS — Z0000 Encounter for general adult medical examination without abnormal findings: Secondary | ICD-10-CM | POA: Diagnosis not present

## 2022-03-13 DIAGNOSIS — M1710 Unilateral primary osteoarthritis, unspecified knee: Secondary | ICD-10-CM | POA: Diagnosis not present

## 2022-03-13 DIAGNOSIS — I5022 Chronic systolic (congestive) heart failure: Secondary | ICD-10-CM | POA: Diagnosis not present

## 2022-03-16 ENCOUNTER — Telehealth: Payer: Self-pay | Admitting: *Deleted

## 2022-03-16 ENCOUNTER — Ambulatory Visit: Payer: Medicare Other | Admitting: Gastroenterology

## 2022-03-16 ENCOUNTER — Encounter: Payer: Self-pay | Admitting: Gastroenterology

## 2022-03-16 ENCOUNTER — Ambulatory Visit: Payer: Self-pay

## 2022-03-16 NOTE — Telephone Encounter (Signed)
Tried to call pt for 9:00 nurse visit by phone x 2 times.  Had to leave a voice mail for pt. ?

## 2022-03-21 ENCOUNTER — Ambulatory Visit (INDEPENDENT_AMBULATORY_CARE_PROVIDER_SITE_OTHER): Payer: Medicare Other

## 2022-03-21 DIAGNOSIS — I5022 Chronic systolic (congestive) heart failure: Secondary | ICD-10-CM | POA: Diagnosis not present

## 2022-03-21 LAB — ECHOCARDIOGRAM COMPLETE
AR max vel: 2.75 cm2
AV Area VTI: 3.01 cm2
AV Area mean vel: 3.01 cm2
AV Mean grad: 4 mmHg
AV Peak grad: 7.3 mmHg
Ao pk vel: 1.35 m/s
Area-P 1/2: 2.92 cm2
S' Lateral: 4.73 cm

## 2022-03-23 ENCOUNTER — Ambulatory Visit (INDEPENDENT_AMBULATORY_CARE_PROVIDER_SITE_OTHER): Payer: Medicare Other | Admitting: Cardiology

## 2022-03-23 ENCOUNTER — Encounter: Payer: Self-pay | Admitting: *Deleted

## 2022-03-23 ENCOUNTER — Encounter: Payer: Self-pay | Admitting: Cardiology

## 2022-03-23 VITALS — BP 130/86 | HR 76 | Ht 66.0 in | Wt 354.6 lb

## 2022-03-23 DIAGNOSIS — I5022 Chronic systolic (congestive) heart failure: Secondary | ICD-10-CM | POA: Diagnosis not present

## 2022-03-23 DIAGNOSIS — I2510 Atherosclerotic heart disease of native coronary artery without angina pectoris: Secondary | ICD-10-CM

## 2022-03-23 NOTE — Progress Notes (Signed)
? ? ? ?Clinical Summary ?Maureen Jackson is a 66 y.o.female seen today for follow up of the following medical problems.  ? ?1. Chronic systolic HF/ICM/CAD ?- patient admitted with acute onset CHF 08/2017.  ?- echo 08/2017 LVEF 30-35%, grade I diastoilc dysfunction ?- 09/2017 cath as reported below, found to have severe multivessel disease of LAD, LCX, and RCA. Mean PA 33, PCWP 25, CI 2.7 ?- seen by CT surgery, recs were for PCI as opposed to CABG. ?-  Received DES to LAD and DES x 2 to RCA, LCX disease managed medically. Recs for lifelong plavix per interventional cards. Has been on high dose ASA per neurology.  ?- discharge weight 09/13/17 312 lbs ?  ? 08/2018 echo limited visually, LVEF 30-40% ?Jan 2020 echo: LVEF 35-40%, though difficult study even with contrast ?  ?05/2020 echo:LVEF 30%, normal RV function, mod pericardial effusion ?  ?01/2022 ER visit with chest pain ?- trop neg x 2 ?- CXR mild congestion ?- EKG SR chronic LBBB ?- burning pain midchest at rest. No other associated symptoms. Lasted a few seconds. Mild pain. Got up to walk to bathroom had some SOB.  ?- chest pain yesterday that went under arm, better with tums.  ?  ? -SOB over the last few weeks. Comes on with low levels of exertion ?- has not some LE edema. No orthopnea ?- mixed compliance with meds, sometimes forgets. Misses about 1-2 times per week ?- upcoming March 16 ?- she reports pcp had stop jardiance, she is unsure.  ?  ? 03/2022 echo: limited visualziation, estimated LVEF 40-50% range ? - last visit we increased lasix to '60mg'$  bid ?- pcp had stopped jardiance, requested records to see reason ?- wt 360 lbs -->354 lbs but our scale, similar to her scale.  ?- exertion limited by back pain. Swelling has improved.  ?- repeat labs showed stable Cr and K ? ? ? ? ? ? ?2. PAD ?05/2020 ABI: right 1.11 left 1.24, however limited distal evaluation. Suggested right posterior tibial occlusion and abnormal left distal waveforms ? ?  ?  ?Past Medical History:   ?Diagnosis Date  ? Arthritis   ? 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 Bleeding from mouth   ? when she brushed teeth or ate  ? Chronic systolic CHF (congestive heart failure) ([place])   ? Coronary artery disease 09/2017  ? a. multivessel CAD by cath in 09/2017 and not felt to be a CABG candidate --> underwent two-vessel PCI with DES to the LAD and DES to the RCA  ? Diabetes mellitus   ? Dyspnea   ? Glaucoma   ? Hypertension   ? Left bundle [place] block   ? Morbid obesity ([place])   ? Obstructive sleep apnea   ? does not wear CPAP  ? Thyroid disease   ? ? ? ?Allergies  ?Allergen Reactions  ? Ampicillin Other (See Comments)  ?  "Allergic," per [facility]  ? Aspirin Nausea Only  ? Hydrocodone Other (See Comments)  ?  "Allergic," per [facility]  ? Unasyn [Ampicillin-Sulbactam Sodium] Rash and Other (See Comments)  ?  "Allergic," per [facility]  ? ? ? ?Current Outpatient Medications  ?Medication Sig Dispense Refill  ? acetaminophen (TYLENOL) 325 MG tablet Take 2 tablets (650 mg total) by mouth every 4 (four) hours as needed for mild pain, fever or headache. 12 tablet 0  ? amLODipine (NORVASC) 5 MG tablet TAKE 1 TABLET(5 MG) BY MOUTH DAILY 90 tablet 3  ? aspirin EC  81 MG tablet Take 1 tablet (81 mg total) by mouth daily with breakfast. 30 tablet 11  ? atorvastatin (LIPITOR) 80 MG tablet TAKE 1 TABLET(80 MG) BY MOUTH DAILY 30 tablet 2  ? calcium carbonate (TUMS - DOSED IN MG ELEMENTAL CALCIUM) 500 MG chewable tablet Chew 4 tablets by mouth daily.    ? carvedilol (COREG) 12.5 MG tablet TAKE 1 TABLET(12.5 MG) BY MOUTH TWICE DAILY 180 tablet 1  ? cetirizine (ZYRTEC) 10 MG tablet Take 10 mg by mouth daily as needed for allergies.   0  ? clopidogrel (PLAVIX) 75 MG tablet TAKE 1 TABLET(75 MG) BY MOUTH DAILY 90 tablet 1  ? COMBIGAN 0.2-0.5 % ophthalmic solution Apply 1 drop to eye 2 (two) times daily.    ? ENTRESTO 97-103 MG TAKE 1 TABLET BY MOUTH TWICE DAILY 180 tablet 2  ? furosemide (LASIX) 40 MG tablet Take 1.5 tablets (60 mg total) by mouth 2 (two) times daily. 270  tablet 3  ? levothyroxine (SYNTHROID) 200 MCG tablet Take 1 tablet (200 mcg total) by mouth daily before breakfast. Take BEFORE Breakfast 30 tablet 3  ? levothyroxine (SYNTHROID) 200 MCG tablet Take 200 mcg by mouth daily before breakfast.    ? levothyroxine (SYNTHROID) 50 MCG tablet Take 50 mcg by mouth daily before breakfast.    ? NOVOLOG FLEXPEN 100 UNIT/ML FlexPen Inject 18 Units into the skin 3 (three) times daily with meals. 15 mL 1  ? OZEMPIC, 0.25 OR 0.5 MG/DOSE, 2 MG/1.5ML SOPN Inject 0.25 mg into the skin once a week.    ? senna-docusate (SENOKOT-S) 8.6-50 MG tablet Take 2 tablets by mouth 2 (two) times daily. (Patient taking differently: Take 2 tablets by mouth daily as needed for mild constipation or moderate constipation.) 60 tablet 3  ? spironolactone (ALDACTONE) 25 MG tablet Take 0.5 tablets (12.5 mg total) by mouth daily. 45 tablet 3  ? TRESIBA FLEXTOUCH 100 UNIT/ML SOPN FlexTouch Pen Inject 60 Units into the skin at bedtime.  (Patient not taking: Reported on 02/09/2022)    ? VENTOLIN HFA 108 (90 Base) MCG/ACT inhaler Inhale 1-2 puffs into the lungs every 6 (six) hours as needed for wheezing or shortness of breath. 18 g 1  ? ?No current facility-administered medications for this visit.  ? ? ? ?Past Surgical History:  ?Procedure Laterality Date  ? ABDOMINAL HYSTERECTOMY    ? CARDIAC CATHETERIZATION  09/12/2017  ? CORONARY STENT INTERVENTION  09/12/2017  ? STENT RESOLUTE ONYX [id number] drug eluting stent was successfully placed  ? CORONARY STENT INTERVENTION N/A 09/12/2017  ? Procedure: CORONARY STENT INTERVENTION;  Surgeon: Leonie Man, MD;  Location: [place] [facility];  Service: Cardiovascular;  Laterality: N/A;  ? INCISION AND DRAINAGE PERIRECTAL ABSCESS Left 09/16/2019  ? Procedure: IRRIGATION AND DEBRIDEMENT LABIA ABSCESS;  Surgeon: Coralie Keens, MD;  Location: [place];  Service: General;  Laterality: Left;  ? IR FLUORO GUIDE CV LINE RIGHT  09/16/2019  ? IR US GUIDE VASC ACCESS RIGHT  09/16/2019   ? LEFT HEART CATH AND CORONARY ANGIOGRAPHY N/A 09/12/2017  ? Procedure: LEFT HEART CATH AND CORONARY ANGIOGRAPHY;  Surgeon: Leonie Man, MD;  Location: [place] [facility];  Service: Cardiovascular;  Laterality: N/A;  ? MASS EXCISION N/A 12/18/2018  ? Procedure: EXCISION TONGUE MASS;  Surgeon: Leta Baptist, MD;  Location: [place];  Service: ENT;  Laterality: N/A;  ? RIGHT/LEFT HEART CATH AND CORONARY ANGIOGRAPHY N/A 09/10/2017  ? Procedure: RIGHT/LEFT HEART CATH AND CORONARY ANGIOGRAPHY;  Surgeon:  Troy Sine, MD;  Location: [place] [facility];  Service: Cardiovascular;  Laterality: N/A;  ? THYROID SURGERY    ? ? ? ?Allergies  ?Allergen Reactions  ? Ampicillin Other (See Comments)  ?  "Allergic," per [facility]  ? Aspirin Nausea Only  ? Hydrocodone Other (See Comments)  ?  "Allergic," per [facility]  ? Unasyn [Ampicillin-Sulbactam Sodium] Rash and Other (See Comments)  ?  "Allergic," per [facility]  ? ? ? ? ?Family History  ?Problem Relation Age of Onset  ? Diabetes Mother   ? Hypertension Mother   ? Cancer Mother   ?     pancreas  ? Hypertension Sister   ? ? ? ?Social History ?Ms. Krzywicki reports that she quit smoking about 16 years ago. Her smoking use included cigarettes. She has never used smokeless tobacco. ?Ms. Bonner reports no history of alcohol use. ? ? ?Review of Systems ?CONSTITUTIONAL: No weight loss, fever, chills, weakness or fatigue.  ?HEENT: Eyes: No visual loss, blurred vision, double vision or yellow sclerae.No hearing loss, sneezing, congestion, runny nose or sore throat.  ?SKIN: No rash or itching.  ?CARDIOVASCULAR: per hpi ?RESPIRATORY: per hpi ?GASTROINTESTINAL: No anorexia, nausea, vomiting or diarrhea. No abdominal pain or blood.  ?GENITOURINARY: No burning on urination, no polyuria ?NEUROLOGICAL: No headache, dizziness, syncope, paralysis, ataxia, numbness or tingling in the extremities. No change in bowel or bladder control.  ?MUSCULOSKELETAL: No muscle, back pain, joint pain or stiffness.  ?LYMPHATICS: No  enlarged nodes. No history of splenectomy.  ?PSYCHIATRIC: No history of depression or anxiety.  ?ENDOCRINOLOGIC: No reports of sweating, cold or heat intolerance. No polyuria or polydipsia.  ?. ? ? ?Physical

## 2022-03-23 NOTE — Patient Instructions (Signed)
Medication Instructions:  ?Continue all current medications. ? ?Labwork: ?none ? ?Testing/Procedures: ?Your physician has requested that you have a limited echocardiogram with contrast.  Echocardiography is a painless test that uses sound waves to create images of your heart. It provides your doctor with information about the size and shape of your heart and how well your heart?s chambers and valves are working. This procedure takes approximately one hour. There are no restrictions for this procedure.  ?Office will contact with results via phone or letter.    ? ?Follow-Up: ?4 months  ? ?Any Other Special Instructions Will Be Listed Below (If Applicable). ? ? ?If you need a refill on your cardiac medications before your next appointment, please call your pharmacy. ? ?

## 2022-03-27 ENCOUNTER — Other Ambulatory Visit: Payer: Self-pay | Admitting: Cardiology

## 2022-04-10 ENCOUNTER — Ambulatory Visit (HOSPITAL_COMMUNITY): Payer: Medicare Other

## 2022-04-19 ENCOUNTER — Ambulatory Visit (INDEPENDENT_AMBULATORY_CARE_PROVIDER_SITE_OTHER): Payer: Medicare Other | Admitting: "Endocrinology

## 2022-04-19 ENCOUNTER — Encounter: Payer: Self-pay | Admitting: "Endocrinology

## 2022-04-19 VITALS — BP 134/82 | HR 72 | Ht 66.0 in | Wt 353.8 lb

## 2022-04-19 DIAGNOSIS — E782 Mixed hyperlipidemia: Secondary | ICD-10-CM

## 2022-04-19 DIAGNOSIS — E1159 Type 2 diabetes mellitus with other circulatory complications: Secondary | ICD-10-CM | POA: Diagnosis not present

## 2022-04-19 DIAGNOSIS — I10 Essential (primary) hypertension: Secondary | ICD-10-CM

## 2022-04-19 DIAGNOSIS — E890 Postprocedural hypothyroidism: Secondary | ICD-10-CM | POA: Diagnosis not present

## 2022-04-19 LAB — POCT GLYCOSYLATED HEMOGLOBIN (HGB A1C): HbA1c, POC (controlled diabetic range): 7.3 % — AB (ref 0.0–7.0)

## 2022-04-19 MED ORDER — ONETOUCH VERIO VI STRP: ORAL_STRIP | 2 refills | Status: AC

## 2022-04-19 MED ORDER — NOVOLOG FLEXPEN 100 UNIT/ML ~~LOC~~ SOPN: 10.0000 [IU] | PEN_INJECTOR | Freq: Three times a day (TID) | SUBCUTANEOUS | 1 refills | Status: DC

## 2022-04-19 NOTE — Progress Notes (Signed)
? ?                                                             Endocrinology Consult Note  ?     04/19/2022, 12:34 PM ? ? ?Subjective:  ? ? Patient ID: Maureen Jackson, female    DOB: [birth year].  ?Maureen Jackson is being seen in consultation for management of currently uncontrolled symptomatic diabetes requested by  Neale Burly, MD. ? ? ?Past Medical History:  ?Diagnosis Date  ? Arthritis   ? 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 Bleeding from mouth   ? when she brushed teeth or ate  ? Chronic systolic CHF (congestive heart failure) ([place])   ? Coronary artery disease 09/2017  ? a. multivessel CAD by cath in 09/2017 and not felt to be a CABG candidate --> underwent two-vessel PCI with DES to the LAD and DES to the RCA  ? Diabetes mellitus   ? Diabetes mellitus, type II ([place])   ? Dyspnea   ? Glaucoma   ? Hypertension   ? Left bundle branch block   ? Morbid obesity ([place])   ? Obstructive sleep apnea   ? does not wear CPAP  ? Thyroid disease   ? ? ?Past Surgical History:  ?Procedure Laterality Date  ? ABDOMINAL HYSTERECTOMY    ? CARDIAC CATHETERIZATION  09/12/2017  ? CORONARY STENT INTERVENTION  09/12/2017  ? STENT RESOLUTE ONYX [id number] drug eluting stent was successfully placed  ? CORONARY STENT INTERVENTION N/A 09/12/2017  ? Procedure: CORONARY STENT INTERVENTION;  Surgeon: Leonie Man, MD;  Location: [facility];  Service: Cardiovascular;  Laterality: N/A;  ? INCISION AND DRAINAGE PERIRECTAL ABSCESS Left 09/16/2019  ? Procedure: IRRIGATION AND DEBRIDEMENT LABIA ABSCESS;  Surgeon: Coralie Keens, MD;  Location: [place];  Service: General;  Laterality: Left;  ? IR FLUORO GUIDE CV LINE RIGHT  09/16/2019  ? IR US GUIDE VASC ACCESS RIGHT  09/16/2019  ? LEFT HEART CATH AND CORONARY ANGIOGRAPHY N/A 09/12/2017  ? Procedure: LEFT HEART CATH AND CORONARY ANGIOGRAPHY;  Surgeon: Leonie Man, MD;  Location: [facility];  Service: Cardiovascular;  Laterality: N/A;  ? MASS EXCISION N/A 12/18/2018  ? Procedure:  EXCISION TONGUE MASS;  Surgeon: Leta Baptist, MD;  Location: [place];  Service: ENT;  Laterality: N/A;  ? RIGHT/LEFT HEART CATH AND CORONARY ANGIOGRAPHY N/A 09/10/2017  ? Procedure: RIGHT/LEFT HEART CATH AND CORONARY ANGIOGRAPHY;  Surgeon: Troy Sine, MD;  Location: [facility];  Service: Cardiovascular;  Laterality: N/A;  ? THYROID SURGERY    ? ? ?Social History  ? ?Socioeconomic History  ? Marital status: Widowed  ?  Spouse name: Not on file  ? Number of children: Not on file  ? Years of education: Not on file  ? Highest education level: Not on file  ?Occupational History  ? 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 Smoking status: Former  ?  Types: Cigarettes  ?  Quit date: 12/14/2005  ?  Years since quitting: 16.3  ? Smokeless tobacco: Never  ? Tobacco comments:  ?  QUIT IN 2005  ?Vaping Use  ? Vaping Use: Never used  ?Substance and Sexual Activity  ? Alcohol use: No  ?  Drug use: No  ? Sexual activity: Never  ?Other Topics Concern  ? Not on file  ?Social History Narrative  ? Not on file  ? ?Social Determinants of Health  ? ?Financial Resource Strain: Not on file  ?Food Insecurity: Not on file  ?Transportation Needs: Not on file  ?Physical Activity: Not on file  ?Stress: Not on file  ?Social Connections: Not on file  ? ? ?Family History  ?Problem Relation Age of Onset  ? Diabetes Mother   ? Hypertension Mother   ? Cancer Mother   ?     pancreas  ? Hypertension Sister   ? ? ?Outpatient Encounter Medications as of 04/19/2022  ?Medication Sig  ? diclofenac Sodium (VOLTAREN) 1 % GEL Apply 2 g topically 2 (two) times daily as needed.  ? glucose blood (ONETOUCH VERIO) test strip Test glucose 4 times day.  ? acetaminophen (TYLENOL) 325 MG tablet Take 2 tablets (650 mg total) by mouth every 4 (four) hours as needed for mild pain, fever or headache.  ? amLODipine (NORVASC) 5 MG tablet TAKE 1 TABLET(5 MG) BY MOUTH DAILY  ? atorvastatin (LIPITOR) 80 MG tablet TAKE 1 TABLET(80 MG) BY MOUTH  DAILY  ? calcium carbonate (TUMS - DOSED IN MG ELEMENTAL CALCIUM) 500 MG chewable tablet Chew 4 tablets by mouth daily.  ? carvedilol (COREG) 12.5 MG tablet TAKE 1 TABLET(12.5 MG) BY MOUTH TWICE DAILY  ? cetirizine (ZYRTEC) 10 MG tablet Take 10 mg by mouth daily as needed for allergies.   ? clopidogrel (PLAVIX) 75 MG tablet TAKE 1 TABLET(75 MG) BY MOUTH DAILY  ? COMBIGAN 0.2-0.5 % ophthalmic solution Apply 1 drop to eye 2 (two) times daily.  ? ENTRESTO 97-103 MG TAKE 1 TABLET BY MOUTH TWICE DAILY  ? furosemide (LASIX) 40 MG tablet Take 1.5 tablets (60 mg total) by mouth 2 (two) times daily.  ? levothyroxine (SYNTHROID) 200 MCG tablet Take 200 mcg by mouth daily before breakfast.  ? levothyroxine (SYNTHROID) 50 MCG tablet Take 50 mcg by mouth daily before breakfast.  ? NOVOLOG FLEXPEN 100 UNIT/ML FlexPen Inject 10-16 Units into the skin 3 (three) times daily with meals.  ? OZEMPIC, 0.25 OR 0.5 MG/DOSE, 2 MG/1.5ML SOPN Inject 0.25 mg into the skin once a week.  ? senna-docusate (SENOKOT-S) 8.6-50 MG tablet Take 2 tablets by mouth 2 (two) times daily. (Patient taking differently: Take 2 tablets by mouth daily as needed for mild constipation or moderate constipation.)  ? spironolactone (ALDACTONE) 25 MG tablet Take 0.5 tablets (12.5 mg total) by mouth daily.  ? TRESIBA FLEXTOUCH 100 UNIT/ML SOPN FlexTouch Pen Inject 50 Units into the skin at bedtime.  ? VENTOLIN HFA 108 (90 Base) MCG/ACT inhaler Inhale 1-2 puffs into the lungs every 6 (six) hours as needed for wheezing or shortness of breath.  ? [DISCONTINUED] aspirin EC 81 MG tablet Take 1 tablet (81 mg total) by mouth daily with breakfast.  ? [DISCONTINUED] NOVOLOG FLEXPEN 100 UNIT/ML FlexPen Inject 18 Units into the skin 3 (three) times daily with meals.  ? ?No facility-administered encounter medications on file as of 04/19/2022.  ? ? ?ALLERGIES: ?Allergies  ?Allergen Reactions  ? Ampicillin Other (See Comments)  ?  "Allergic," per [facility]  ? Aspirin Nausea Only  ?  Hydrocodone Other (See Comments)  ?  "Allergic," per [facility]  ? Unasyn [Ampicillin-Sulbactam Sodium] Rash and Other (See Comments)  ?  "Allergic," per [facility]  ? ? ?VACCINATION STATUS: ?Immunization History  ?Administered Date(s) Administered  ? Influenza  Whole 09/26/2005  ? Tdap 10/04/2020  ? ? ?Diabetes ?She presents for her initial diabetic visit. She has type 2 diabetes mellitus. Onset time: Patient was diagnosed at approximate age of 8 years. Her disease course has been improving. There are no hypoglycemic associated symptoms. Pertinent negatives for hypoglycemia include no confusion, headaches, pallor or seizures. Associated symptoms include fatigue. Pertinent negatives for diabetes include no chest pain, no polydipsia, no polyphagia and no polyuria. There are no hypoglycemic complications. Symptoms are improving. (Morbid obesity, comorbid conditions of hyperlipidemia, hypertension.) Risk factors for coronary artery disease include diabetes mellitus, dyslipidemia, family history, hypertension, obesity, sedentary lifestyle, post-menopausal and tobacco exposure. Current diabetic treatments: Patient is on Tresiba 60 units nightly, NovoLog 18 units 3 times daily AC.  She is also on Ozempic 0.25 mg subcutaneously weekly. Her weight is fluctuating minimally. She is following a generally unhealthy diet. When asked about meal planning, she reported none. She has not had a previous visit with a dietitian. She never participates in exercise. (She has a did not bring any logs nor meter to review.  Her point-of-care A1c is 7.3%.  Did not tolerate any higher than 0.25 mg of Ozempic. ?) An ACE inhibitor/angiotensin II receptor blocker is being taken.  ?Hypertension ?This is a chronic problem. The current episode started more than 1 year ago. The problem is controlled. Pertinent negatives include no chest pain, headaches, palpitations or shortness of breath. Risk factors for coronary artery disease include family history,  dyslipidemia, diabetes mellitus, obesity, sedentary lifestyle, smoking/tobacco exposure and post-menopausal state. Past treatments include angiotensin blockers and beta blockers.  ?Hyperlipidemia ?This is a chronic probl

## 2022-04-19 NOTE — Patient Instructions (Signed)

## 2022-04-20 ENCOUNTER — Other Ambulatory Visit: Payer: Self-pay

## 2022-04-20 ENCOUNTER — Other Ambulatory Visit: Payer: Self-pay | Admitting: Cardiology

## 2022-04-20 DIAGNOSIS — E1159 Type 2 diabetes mellitus with other circulatory complications: Secondary | ICD-10-CM

## 2022-04-20 MED ORDER — NOVOLOG FLEXPEN 100 UNIT/ML ~~LOC~~ SOPN: 10.0000 [IU] | PEN_INJECTOR | Freq: Three times a day (TID) | SUBCUTANEOUS | 1 refills | Status: DC

## 2022-04-26 IMAGING — DX DG KNEE COMPLETE 4+V*R*
4 series · 4 of 4 positions shown · non-contrast
Comparison: 02/14/2016

CLINICAL DATA: Right knee pain for 2 weeks

EXAM:
RIGHT KNEE - COMPLETE 4+ VIEW

[knee ap]
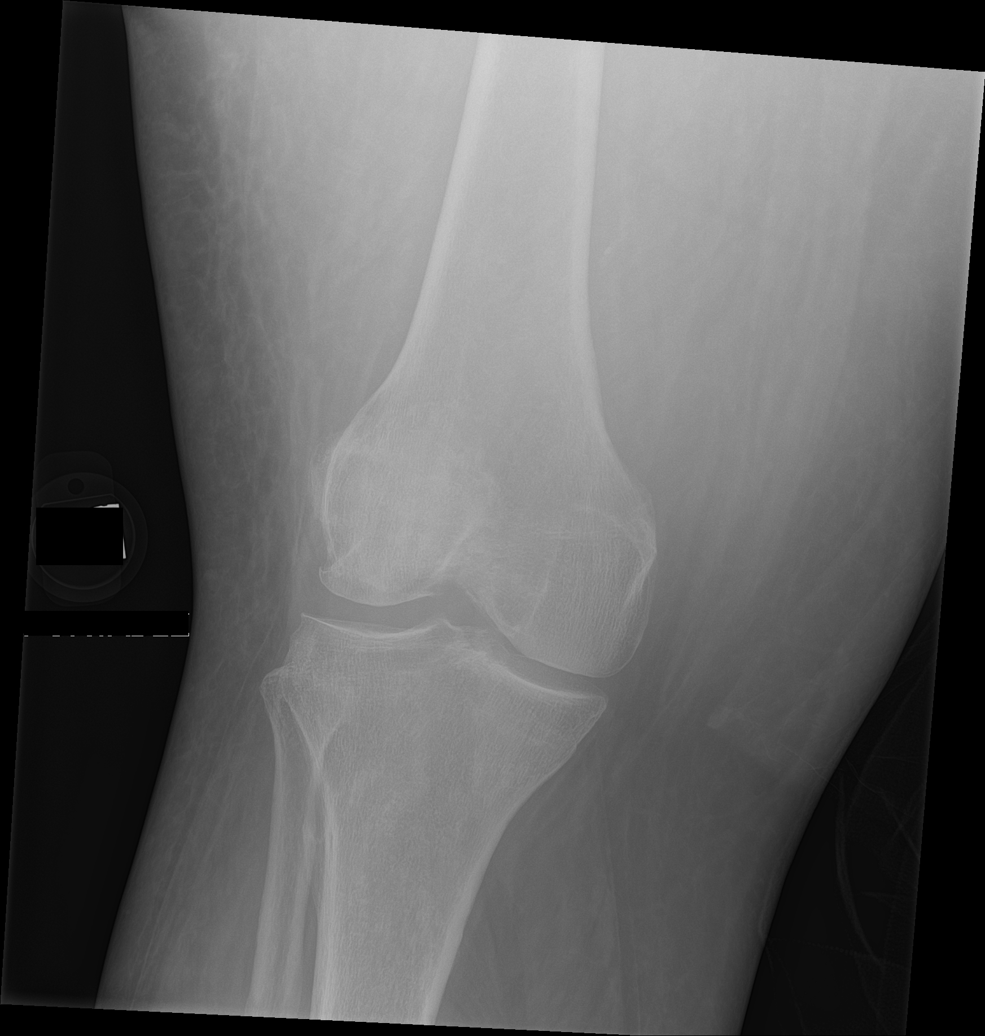

[knee lat]
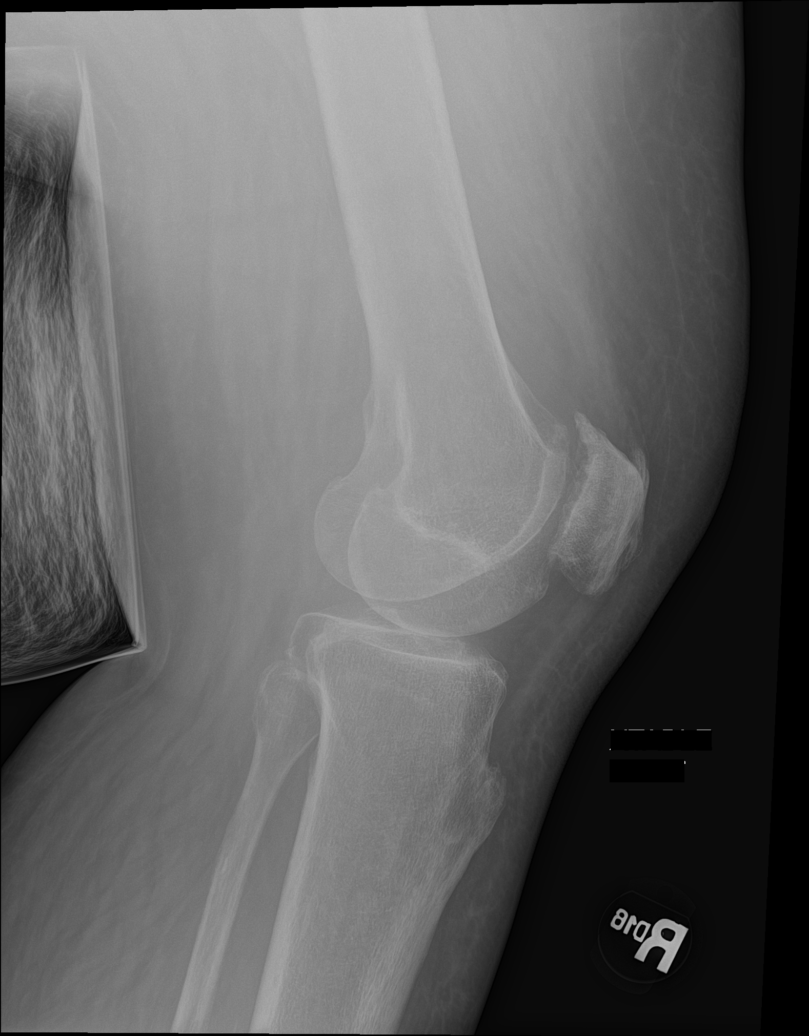

[knee obl (1 of 2)]
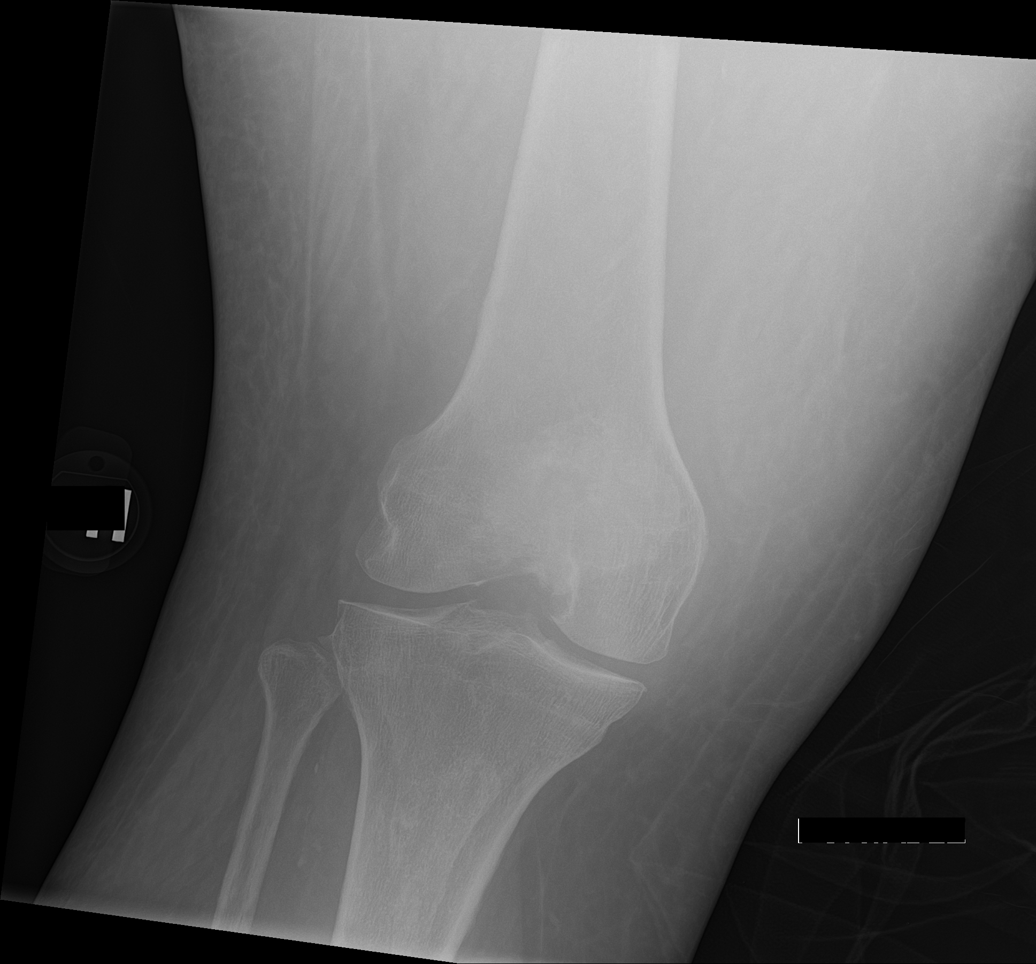

[knee obl (2 of 2)]
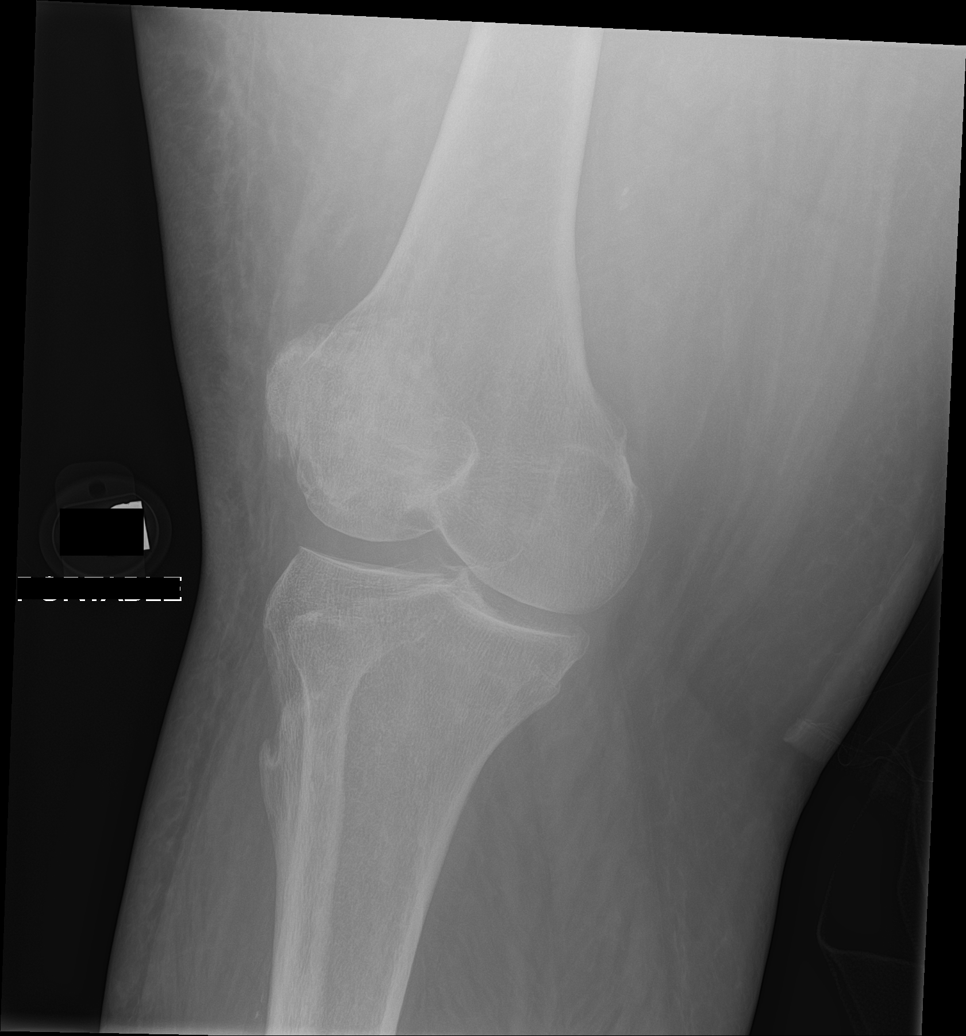

[4 of 4 positions shown; findings below may reference images not displayed]

FINDINGS: Frontal, bilateral oblique, lateral views of the right knee are
obtained. No fracture, subluxation, or dislocation. There is 3
compartmental osteoarthritis most pronounced in the patellofemoral
compartment. This is progressive since prior study. Small joint
effusion. Mild diffuse subcutaneous edema.
IMPRESSION: 1. Progressive osteoarthritis most pronounced in the patellofemoral
compartment.
2. Small effusion.
3. Diffuse subcutaneous edema.

## 2022-05-07 ENCOUNTER — Other Ambulatory Visit: Payer: Self-pay | Admitting: Cardiology

## 2022-05-09 ENCOUNTER — Ambulatory Visit: Payer: Medicare Other | Admitting: "Endocrinology

## 2022-06-20 ENCOUNTER — Other Ambulatory Visit: Payer: Self-pay | Admitting: "Endocrinology

## 2022-06-20 DIAGNOSIS — E1159 Type 2 diabetes mellitus with other circulatory complications: Secondary | ICD-10-CM

## 2022-07-19 ENCOUNTER — Other Ambulatory Visit: Payer: Self-pay | Admitting: *Deleted

## 2022-07-19 MED ORDER — AMLODIPINE BESYLATE 5 MG PO TABS: ORAL_TABLET | ORAL | 3 refills | Status: DC

## 2022-07-26 ENCOUNTER — Encounter: Payer: Self-pay | Admitting: Cardiology

## 2022-07-26 ENCOUNTER — Ambulatory Visit (INDEPENDENT_AMBULATORY_CARE_PROVIDER_SITE_OTHER): Payer: Medicare Other | Admitting: Cardiology

## 2022-07-26 ENCOUNTER — Telehealth: Payer: Self-pay | Admitting: Cardiology

## 2022-07-26 VITALS — BP 124/70 | HR 72 | Ht 65.0 in | Wt 356.0 lb

## 2022-07-26 DIAGNOSIS — I2510 Atherosclerotic heart disease of native coronary artery without angina pectoris: Secondary | ICD-10-CM

## 2022-07-26 DIAGNOSIS — I5022 Chronic systolic (congestive) heart failure: Secondary | ICD-10-CM | POA: Diagnosis not present

## 2022-07-26 NOTE — Progress Notes (Signed)
Clinical Summary Maureen Jackson is a 66 y.o.female seen today for follow up of the following medical problems.    1. Chronic systolic HF/ICM/CAD - patient admitted with acute onset CHF 08/2017.  - echo 08/2017 LVEF 30-35%, grade I diastoilc dysfunction - 09/2017 cath as reported below, found to have severe multivessel disease of LAD, LCX, and RCA. Mean PA 33, PCWP 25, CI 2.7 - seen by CT surgery, recs were for PCI as opposed to CABG. -  Received DES to LAD and DES x 2 to RCA, LCX disease managed medically. Recs for lifelong plavix per interventional cards. Has been on high dose ASA per neurology.  - discharge weight 09/13/17 312 lbs    08/2018 echo limited visually, LVEF 30-40% Jan 2020 echo: LVEF 35-40%, though difficult study even with contrast   05/2020 echo:LVEF 30%, normal RV function, mod pericardial effusion   01/2022 ER visit with chest pain - trop neg x 2 - CXR mild congestion - EKG SR chronic LBBB - burning pain midchest at rest. No other associated symptoms. Lasted a few seconds. Mild pain. Got up to walk to bathroom had some SOB.  - chest pain yesterday that went under arm, better with tums.     -SOB over the last few weeks. Comes on with low levels of exertion - has not some LE edema. No orthopnea - mixed compliance with meds, sometimes forgets. Misses about 1-2 times per week - upcoming March 16 - she reports pcp had stop jardiance, she is unsure.     03/2022 echo: limited visualziation, estimated LVEF 40-50% range  - last visit we increased lasix to '60mg'$  bid - pcp had stopped jardiance, requested records to see reason - wt 360 lbs -->354 lbs but our scale, similar to her scale.  - exertion limited by back pain. Swelling has improved.  - repeat labs showed stable Cr and K      *did not have limited echo with contrast  - no recent SOB/DOE, no LE edema - compliant with meds, she is on plavix.  - home weights 354-356 lbs.      2. PAD 05/2020 ABI: right 1.11 left  1.24, however limited distal evaluation. Suggested right posterior tibial occlusion and abnormal left distal waveforms - no recent symptoms.  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 multivessel CAD by cath in 09/2017 and not felt to be a CABG candidate --> underwent two-vessel PCI with DES to the LAD and DES to the RCA   Diabetes mellitus    Diabetes mellitus, type II ([place])    Dyspnea    Glaucoma    Hypertension    Left bundle Maureen Jackson block    Morbid obesity ([place])    Obstructive sleep apnea    does not wear CPAP   Thyroid disease      Allergies  Allergen Reactions   Ampicillin Other (See Comments)    "Allergic," per [facility]   Aspirin Nausea Only   Hydrocodone Other (See Comments)    "Allergic," per [facility]   Unasyn [Ampicillin-Sulbactam Sodium] Rash and Other (See Comments)    "Allergic," per [facility]     Current Outpatient Medications  Medication Sig Dispense Refill   acetaminophen (TYLENOL) 325 MG tablet Take 2  tablets (650 mg total) by mouth every 4 (four) hours as needed for mild pain, fever or headache. 12 tablet 0   amLODipine (NORVASC) 5 MG tablet TAKE 1 TABLET(5 MG) BY MOUTH DAILY 90 tablet 3   atorvastatin (LIPITOR) 80 MG tablet TAKE 1 TABLET(80 MG) BY MOUTH DAILY 30 tablet 6   calcium carbonate (TUMS - DOSED IN MG ELEMENTAL CALCIUM) 500 MG chewable tablet Chew 4 tablets by mouth daily.     carvedilol (COREG) 12.5 MG tablet TAKE 1 TABLET(12.5 MG) BY MOUTH TWICE DAILY 180 tablet 1   cetirizine (ZYRTEC) 10 MG tablet Take 10 mg by mouth daily as needed for allergies.   0   clopidogrel (PLAVIX) 75 MG tablet TAKE 1 TABLET(75 MG) BY MOUTH DAILY 90 tablet 1   COMBIGAN 0.2-0.5 % ophthalmic solution Apply 1 drop to eye 2 (two) times daily.     diclofenac Sodium (VOLTAREN) 1 % GEL Apply 2 g topically 2 (two)  times daily as needed.     ENTRESTO 97-103 MG TAKE 1 TABLET BY MOUTH TWICE DAILY 180 tablet 2   furosemide (LASIX) 40 MG tablet Take 1.5 tablets (60 mg total) by mouth 2 (two) times daily. 270 tablet 3   glucose blood (ONETOUCH VERIO) test strip Test glucose 4 times day. 150 each 2   levothyroxine (SYNTHROID) 200 MCG tablet Take 200 mcg by mouth daily before breakfast.     levothyroxine (SYNTHROID) 50 MCG tablet Take 50 mcg by mouth daily before breakfast.     NOVOLOG FLEXPEN 100 UNIT/ML FlexPen ADMINISTER 10 TO 16 UNITS UNDER THE SKIN THREE TIMES DAILY WITH MEALS 15 mL 1   OZEMPIC, 0.25 OR 0.5 MG/DOSE, 2 MG/1.5ML SOPN Inject 0.25 mg into the skin once a week.     senna-docusate (SENOKOT-S) 8.6-50 MG tablet Take 2 tablets by mouth 2 (two) times daily. (Patient taking differently: Take 2 tablets by mouth daily as needed for mild constipation or moderate constipation.) 60 tablet 3   spironolactone (ALDACTONE) 25 MG tablet Take 0.5 tablets (12.5 mg total) by mouth daily. 45 tablet 3   TRESIBA FLEXTOUCH 100 UNIT/ML SOPN FlexTouch Pen Inject 50 Units into the skin at bedtime.     VENTOLIN HFA 108 (90 Base) MCG/ACT inhaler Inhale 1-2 puffs into the lungs every 6 (six) hours as needed for wheezing or shortness of breath. 18 g 1   No current facility-administered medications for this visit.     Past Surgical History:  Procedure Laterality Date   ABDOMINAL HYSTERECTOMY     CARDIAC CATHETERIZATION  09/12/2017   CORONARY STENT INTERVENTION  09/12/2017   STENT RESOLUTE ONYX [id number] drug eluting stent was successfully placed   CORONARY STENT INTERVENTION N/A 09/12/2017   Procedure: CORONARY STENT INTERVENTION;  Surgeon: Leonie Man, MD;  Location: [place] [facility];  Service: Cardiovascular;  Laterality: N/A;   INCISION AND DRAINAGE PERIRECTAL ABSCESS Left 09/16/2019   Procedure: IRRIGATION AND DEBRIDEMENT LABIA ABSCESS;  Surgeon: Coralie Keens, MD;  Location: [place];  Service: General;   Laterality: Left;   IR FLUORO GUIDE CV LINE RIGHT  09/16/2019   IR US GUIDE VASC ACCESS RIGHT  09/16/2019   LEFT HEART CATH AND CORONARY ANGIOGRAPHY N/A 09/12/2017   Procedure: LEFT HEART CATH AND CORONARY ANGIOGRAPHY;  Surgeon: Leonie Man, MD;  Location: [place] [facility];  Service: Cardiovascular;  Laterality: N/A;   MASS EXCISION N/A 12/18/2018   Procedure: EXCISION TONGUE MASS;  Surgeon: Leta Baptist, MD;  Location: [place];  Service: ENT;  Laterality: N/A;   RIGHT/LEFT HEART CATH AND CORONARY ANGIOGRAPHY N/A 09/10/2017   Procedure: RIGHT/LEFT HEART CATH AND CORONARY ANGIOGRAPHY;  Surgeon: Troy Sine, MD;  Location: [place] [facility];  Service: Cardiovascular;  Laterality: N/A;   THYROID SURGERY       Allergies  Allergen Reactions   Ampicillin Other (See Comments)    "Allergic," per MAR   Aspirin Nausea Only   Hydrocodone Other (See Comments)    "Allergic," per MAR   Unasyn [Ampicillin-Sulbactam Sodium] Rash and Other (See Comments)    "Allergic," per [facility]      Family History  Problem Relation Age of Onset   Diabetes Mother    Hypertension Mother    Cancer Mother        pancreas   Hypertension Sister      Social History Maureen Jackson reports that she quit smoking about 16 years ago. Her smoking use included cigarettes. She has never used smokeless tobacco. Maureen Jackson reports no history of alcohol use.   Review of Systems CONSTITUTIONAL: No weight loss, fever, chills, weakness or fatigue.  HEENT: Eyes: No visual loss, blurred vision, double vision or yellow sclerae.No hearing loss, sneezing, congestion, runny nose or sore throat.  SKIN: No rash or itching.  CARDIOVASCULAR: per hpi RESPIRATORY: No shortness of breath, cough or sputum.  GASTROINTESTINAL: No anorexia, nausea, vomiting or diarrhea. No abdominal pain or blood.  GENITOURINARY: No burning on urination, no polyuria NEUROLOGICAL: No headache, dizziness, syncope, paralysis, ataxia, numbness or tingling in  the extremities. No change in bowel or bladder control.  MUSCULOSKELETAL: No muscle, back pain, joint pain or stiffness.  LYMPHATICS: No enlarged nodes. No history of splenectomy.  PSYCHIATRIC: No history of depression or anxiety.  ENDOCRINOLOGIC: No reports of sweating, cold or heat intolerance. No polyuria or polydipsia.  Marland Kitchen   Physical Examination Today's Vitals   07/26/22 1011  BP: 124/70  Pulse: 72  SpO2: 97%  Weight: (!) 356 lb (161.5 kg)  Height: '5\' 5"'$  (1.651 m)   Body mass index is 59.24 kg/m.  Gen: resting comfortably, no acute distress HEENT: no scleral icterus, pupils equal round and reactive, no palptable cervical adenopathy,  CV: RRR, no m/r/g no jvd Resp: Clear to auscultation bilaterally GI: abdomen is soft, non-tender, non-distended, normal bowel sounds, no hepatosplenomegaly MSK: extremities are warm, no edema.  Skin: warm, no rash Neuro:  no focal deficits Psych: appropriate affect   Diagnostic Studies  05/2020 echo IMPRESSIONS     1. Images are limited. Suggest Definity contrast when adequate IV access  available.   2. Left ventricular ejection fraction, by estimation, is approximately  30%. The left ventricle has severely decreased function. Left ventricular  endocardial border not optimally defined to evaluate regional wall motion.  There is moderate left  ventricular hypertrophy. Left ventricular diastolic parameters are  indeterminate.   3. Right ventricular systolic function is normal. The right ventricular  size is normal.   4. Moderate pericardial effusion. The pericardial effusion is  circumferential.   5. The mitral valve is grossly normal. Trivial mitral valve  regurgitation.   6. The aortic valve is tricuspid. Aortic valve regurgitation is not  visualized.   7. The inferior vena cava is normal in size with greater than 50%  respiratory variability, suggesting right atrial pressure of 3 mmHg.     03/2022 echo 1. Endocardium poorly  visualized, very difficult LVEF assessment. Grossly  very limited estimate LVEF appears 40-50% range. Recommend limited echo  with contrast to better clarify function. . Left ventricular ejection  fraction, by estimation, is 40-50%.        Assessment and Plan  Chronic systolic HF -euvolemic today, continue current meds - she reports pcp had stopped jardiance, unclear from note review indication. F/u limited echo, pending results may need to reconsider jardiance - obtain limited echo with contrast, difficult LVEF assessment from last study.    2. CAD No recent symptoms, continue current meds     Arnoldo Lenis, M.D.

## 2022-07-26 NOTE — Patient Instructions (Signed)
Medication Instructions:  Your physician recommends that you continue on your current medications as directed. Please refer to the Current Medication list given to you today.  Labwork: none  Testing/Procedures: Your physician has requested that you have an echocardiogram. Echocardiography is a painless test that uses sound waves to create images of your heart. It provides your doctor with information about the size and shape of your heart and how well your heart's chambers and valves are working. This procedure takes approximately one hour. There are no restrictions for this procedure.  Follow-Up: Your physician recommends that you schedule a follow-up appointment in: 6 months  Any Other Special Instructions Will Be Listed Below (If Applicable).  If you need a refill on your cardiac medications before your next appointment, please call your pharmacy. 

## 2022-07-26 NOTE — Telephone Encounter (Signed)
Checking percert on the following patient for testing scheduled at [facility].    LIMITED ECHO WITH DEFINITY  08/03/2022

## 2022-08-02 DIAGNOSIS — E1121 Type 2 diabetes mellitus with diabetic nephropathy: Secondary | ICD-10-CM | POA: Diagnosis not present

## 2022-08-02 DIAGNOSIS — I10 Essential (primary) hypertension: Secondary | ICD-10-CM | POA: Diagnosis not present

## 2022-08-02 DIAGNOSIS — E038 Other specified hypothyroidism: Secondary | ICD-10-CM | POA: Diagnosis not present

## 2022-08-02 DIAGNOSIS — Z0000 Encounter for general adult medical examination without abnormal findings: Secondary | ICD-10-CM | POA: Diagnosis not present

## 2022-08-02 DIAGNOSIS — J4520 Mild intermittent asthma, uncomplicated: Secondary | ICD-10-CM | POA: Diagnosis not present

## 2022-08-02 DIAGNOSIS — I5022 Chronic systolic (congestive) heart failure: Secondary | ICD-10-CM | POA: Diagnosis not present

## 2022-08-02 DIAGNOSIS — M1710 Unilateral primary osteoarthritis, unspecified knee: Secondary | ICD-10-CM | POA: Diagnosis not present

## 2022-08-03 ENCOUNTER — Ambulatory Visit (HOSPITAL_COMMUNITY): Admission: RE | Admit: 2022-08-03 | Payer: Medicare Other | Source: Ambulatory Visit

## 2022-08-15 ENCOUNTER — Telehealth: Payer: Self-pay | Admitting: "Endocrinology

## 2022-08-15 NOTE — Telephone Encounter (Signed)
Called pt and left a detailed VM that her PCP would like her to please get back in to see Dr Dorris Fetch, now for her thyroid. Updated information in referral folder.

## 2022-08-24 ENCOUNTER — Other Ambulatory Visit: Payer: Self-pay | Admitting: "Endocrinology

## 2022-08-24 DIAGNOSIS — E1159 Type 2 diabetes mellitus with other circulatory complications: Secondary | ICD-10-CM

## 2022-08-26 ENCOUNTER — Other Ambulatory Visit: Payer: Self-pay

## 2022-08-26 ENCOUNTER — Emergency Department (HOSPITAL_COMMUNITY): Payer: Medicare Other

## 2022-08-26 ENCOUNTER — Emergency Department (HOSPITAL_COMMUNITY)
Admission: EM | Admit: 2022-08-26 | Discharge: 2022-08-27 | Disposition: A | Discharge status: Home / Self Care | Payer: Medicare Other | Attending: Emergency Medicine | Admitting: Emergency Medicine

## 2022-08-26 DIAGNOSIS — R0602 Shortness of breath: Secondary | ICD-10-CM | POA: Diagnosis not present

## 2022-08-26 DIAGNOSIS — I447 Left bundle-branch block, unspecified: Secondary | ICD-10-CM | POA: Diagnosis not present

## 2022-08-26 DIAGNOSIS — Z79899 Other long term (current) drug therapy: Secondary | ICD-10-CM | POA: Diagnosis not present

## 2022-08-26 DIAGNOSIS — I10 Essential (primary) hypertension: Secondary | ICD-10-CM | POA: Diagnosis not present

## 2022-08-26 DIAGNOSIS — I110 Hypertensive heart disease with heart failure: Secondary | ICD-10-CM | POA: Insufficient documentation

## 2022-08-26 DIAGNOSIS — I2510 Atherosclerotic heart disease of native coronary artery without angina pectoris: Secondary | ICD-10-CM | POA: Diagnosis not present

## 2022-08-26 DIAGNOSIS — E119 Type 2 diabetes mellitus without complications: Secondary | ICD-10-CM | POA: Insufficient documentation

## 2022-08-26 DIAGNOSIS — R062 Wheezing: Secondary | ICD-10-CM | POA: Diagnosis not present

## 2022-08-26 DIAGNOSIS — R609 Edema, unspecified: Secondary | ICD-10-CM | POA: Diagnosis not present

## 2022-08-26 DIAGNOSIS — Z7902 Long term (current) use of antithrombotics/antiplatelets: Secondary | ICD-10-CM | POA: Diagnosis not present

## 2022-08-26 DIAGNOSIS — I5023 Acute on chronic systolic (congestive) heart failure: Secondary | ICD-10-CM | POA: Insufficient documentation

## 2022-08-26 DIAGNOSIS — R0789 Other chest pain: Secondary | ICD-10-CM | POA: Diagnosis not present

## 2022-08-26 LAB — BASIC METABOLIC PANEL
Anion gap: 7 (ref 5–15)
BUN: 20 mg/dL (ref 8–23)
CO2: 27 mmol/L (ref 22–32)
Calcium: 8.1 mg/dL — ABNORMAL LOW (ref 8.9–10.3)
Chloride: 108 mmol/L (ref 98–111)
Creatinine, Ser: 1.17 mg/dL — ABNORMAL HIGH (ref 0.44–1.00)
GFR, Estimated: 52 mL/min — ABNORMAL LOW (ref >60)
Glucose, Bld: 182 mg/dL — ABNORMAL HIGH (ref 70–99)
Potassium: 4.4 mmol/L (ref 3.5–5.1)
Sodium: 142 mmol/L (ref 135–145)

## 2022-08-26 LAB — CBC WITH DIFFERENTIAL/PLATELET
Abs Immature Granulocytes: 0.03 10*3/uL (ref 0.00–0.07)
Basophils Absolute: 0 10*3/uL (ref 0.0–0.1)
Basophils Relative: 0 %
Eosinophils Absolute: 0.6 10*3/uL — ABNORMAL HIGH (ref 0.0–0.5)
Eosinophils Relative: 9 %
HCT: 35.8 % — ABNORMAL LOW (ref 36.0–46.0)
Hemoglobin: 11.3 g/dL — ABNORMAL LOW (ref 12.0–15.0)
Immature Granulocytes: 1 %
Lymphocytes Relative: 30 %
Lymphs Abs: 2 10*3/uL (ref 0.7–4.0)
MCH: 28.5 pg (ref 26.0–34.0)
MCHC: 31.6 g/dL (ref 30.0–36.0)
MCV: 90.2 fL (ref 80.0–100.0)
Monocytes Absolute: 0.3 10*3/uL (ref 0.1–1.0)
Monocytes Relative: 5 %
Neutro Abs: 3.6 10*3/uL (ref 1.7–7.7)
Neutrophils Relative %: 55 %
Platelets: 218 10*3/uL (ref 150–400)
RBC: 3.97 MIL/uL (ref 3.87–5.11)
RDW: 15.2 % (ref 11.5–15.5)
WBC: 6.6 10*3/uL (ref 4.0–10.5)
nRBC: 0 % (ref 0.0–0.2)

## 2022-08-26 LAB — BRAIN NATRIURETIC PEPTIDE: B Natriuretic Peptide: 107 pg/mL — ABNORMAL HIGH (ref 0.0–100.0)

## 2022-08-26 MED ORDER — FUROSEMIDE 10 MG/ML IJ SOLN
40.0000 mg | Freq: Once | INTRAMUSCULAR | Status: AC
  Administered 2022-08-27: 40 mg via INTRAVENOUS
  Filled 2022-08-26: qty 4

## 2022-08-26 NOTE — ED Provider Notes (Signed)
[facility] EMERGENCY DEPARTMENT Provider Note   CSN: [id number] Arrival date & time: 08/26/22  2000     History {Add pertinent medical, surgical, social history, OB history to HPI:1} Chief Complaint  Patient presents with   Shortness of Breath    Maureen Jackson is a 66 y.o. female.  HPI     This is a 66 year old female with a history of hypertension, coronary artery disease, systolic heart failure who presents with shortness of breath.  Patient reports progressive worsening dyspnea on exertion over the last week.  She stopped taking her Lasix on Monday because "I did not think I need it."  She did take 1 dose of Lasix today, 40 mg.  She describes dyspnea on exertion.  Over the last 2 days she describes pressure on her chest that is also worse with exertion.  She has had some cough.  No fevers.  Has also noted lower extremity swelling.  Patient was supposed to have her Lasix increased but had to have it titrated back to 40 mg daily secondary to renal dysfunction.  Last echo in April 2023 with an improved EF to 40 to 50%.  Previously 30%.  Home Medications Prior to Admission medications   Medication Sig Start Date End Date Taking? Authorizing Provider  acetaminophen (TYLENOL) 325 MG tablet Take 2 tablets (650 mg total) by mouth every 4 (four) hours as needed for mild pain, fever or headache. 05/25/20  Yes Emokpae, Courage, MD  amLODipine (NORVASC) 5 MG tablet TAKE 1 TABLET(5 MG) BY MOUTH DAILY Patient taking differently: Take 5 mg by mouth daily. 07/19/22  Yes Branch, Alphonse Guild, MD  atorvastatin (LIPITOR) 80 MG tablet TAKE 1 TABLET(80 MG) BY MOUTH DAILY Patient taking differently: Take 80 mg by mouth every evening. 05/09/22  Yes Branch, Alphonse Guild, MD  calcium carbonate (TUMS - DOSED IN MG ELEMENTAL CALCIUM) 500 MG chewable tablet Chew 4 tablets by mouth every evening.   Yes [provider]  carvedilol (COREG) 12.5 MG tablet TAKE 1 TABLET(12.5 MG) BY MOUTH  TWICE DAILY Patient taking differently: Take 12.5 mg by mouth 2 (two) times daily with a meal. 04/21/22  Yes Branch, Alphonse Guild, MD  cetirizine (ZYRTEC) 10 MG tablet Take 10 mg by mouth daily as needed for allergies.  09/12/18  Yes [provider]  clopidogrel (PLAVIX) 75 MG tablet TAKE 1 TABLET(75 MG) BY MOUTH DAILY Patient taking differently: Take 75 mg by mouth daily. 03/27/22  Yes Branch, Alphonse Guild, MD  COMBIGAN 0.2-0.5 % ophthalmic solution Apply 1 drop to eye 2 (two) times daily. 02/08/22  Yes [provider]  ENTRESTO 97-103 MG TAKE 1 TABLET BY MOUTH TWICE DAILY Patient taking differently: Take 1 tablet by mouth 2 (two) times daily. 04/21/22  Yes Branch, Alphonse Guild, MD  furosemide (LASIX) 40 MG tablet Take 1.5 tablets (60 mg total) by mouth 2 (two) times daily. Patient taking differently: Take 40 mg by mouth daily. 02/09/22  Yes Branch, Alphonse Guild, MD  levothyroxine (SYNTHROID) 200 MCG tablet Take 200 mcg by mouth daily before breakfast. Take 1 tablet with 26mg for a total dose of 2567m daily   Yes [provider]  levothyroxine (SYNTHROID) 50 MCG tablet Take 50 mcg by mouth daily before breakfast. Take 1 tablet with 20074mfor a total dose of 250m86maily   Yes [provider]  OZEMPIC, 0.25 OR 0.5 MG/DOSE, 2 MG/1.5ML SOPN Inject 0.25 mg into the skin once a week. Thursday 01/10/22  Yes  [provider]  senna-docusate (SENOKOT-S) 8.6-50 MG tablet Take 2 tablets by mouth 2 (two) times daily. Patient taking differently: Take 2 tablets by mouth daily as needed for mild constipation or moderate constipation. 05/25/20  Yes Emokpae, Courage, MD  TRESIBA FLEXTOUCH 100 UNIT/ML SOPN FlexTouch Pen Inject 16 Units into the skin at bedtime as needed (for blood glucose greater than 150). 10/08/19  Yes [provider]  glucose blood (ONETOUCH VERIO) test strip Test glucose 4 times day. 04/19/22   Cassandria Anger, MD  NOVOLOG FLEXPEN 100 UNIT/ML FlexPen  ADMINISTER 10 TO 16 UNITS UNDER THE SKIN THREE TIMES DAILY WITH MEALS Patient not taking: Reported on 08/26/2022 08/24/22   Cassandria Anger, MD  spironolactone (ALDACTONE) 25 MG tablet Take 0.5 tablets (12.5 mg total) by mouth daily. Patient not taking: Reported on 08/26/2022 02/09/22   Arnoldo Lenis, MD  VENTOLIN HFA 108 [phone] Base) MCG/ACT inhaler Inhale 1-2 puffs into the lungs every 6 (six) hours as needed for wheezing or shortness of breath. 05/25/20   Roxan Hockey, MD      Allergies    Ampicillin, Aspirin, Hydrocodone, and Unasyn [ampicillin-sulbactam sodium]    Review of Systems   Review of Systems  Constitutional:  Negative for fever.  Respiratory:  Positive for cough, chest tightness and shortness of breath.   Cardiovascular:  Positive for leg swelling.  All other systems reviewed and are negative.   Physical Exam Updated Vital Signs BP (!) 151/84 (BP Location: Left Arm)   Pulse 84   Temp 97.6 F (36.4 C) (Oral)   Resp 20   SpO2 96%  Physical Exam Vitals and nursing note reviewed.  Constitutional:      Appearance: She is well-developed. She is obese.     Comments: Morbidly obese, hirsutism  HENT:     Head: Normocephalic and atraumatic.  Eyes:     Pupils: Pupils are equal, round, and reactive to light.  Cardiovascular:     Rate and Rhythm: Normal rate and regular rhythm.     Heart sounds: Normal heart sounds.  Pulmonary:     Effort: Pulmonary effort is normal. No respiratory distress.     Breath sounds: Decreased breath sounds and rales present.     Comments: Diminished breath sounds at all lung fields, Rales noted, severely limited by body habitus Abdominal:     General: Bowel sounds are normal.     Palpations: Abdomen is soft.  Musculoskeletal:     Cervical back: Neck supple.     Right lower leg: Edema present.     Left lower leg: Edema present.  Skin:    General: Skin is warm and dry.  Neurological:     Mental Status: She is alert and oriented to  person, place, and time.  Psychiatric:        Mood and Affect: Mood normal.     ED Results / Procedures / Treatments   Labs (all labs ordered are listed, but only abnormal results are displayed) Labs Reviewed  BASIC METABOLIC PANEL - Abnormal; Notable for the following components:      Result Value   Glucose, Bld 182 (*)    Creatinine, Ser 1.17 (*)    Calcium 8.1 (*)    GFR, Estimated 52 (*)    All other components within normal limits  CBC WITH DIFFERENTIAL/PLATELET - Abnormal; Notable for the following components:   Hemoglobin 11.3 (*)    HCT 35.8 (*)    Eosinophils Absolute 0.6 (*)  All other components within normal limits  BRAIN NATRIURETIC PEPTIDE - Abnormal; Notable for the following components:   B Natriuretic Peptide 107.0 (*)    All other components within normal limits  TROPONIN I (HIGH SENSITIVITY)    EKG EKG Interpretation  Date/Time:  Saturday August 26 2022 20:08:09 EDT Ventricular Rate:  82 PR Interval:  150 QRS Duration: 148 QT Interval:  448 QTC Calculation: 523 R Axis:   28 Text Interpretation: Normal sinus rhythm Left bundle branch block Abnormal ECG When compared with ECG of 06-Feb-2022 17:51, PREVIOUS ECG IS PRESENT since last tracing no significant change Confirmed by Noemi Chapel [phone]) on 08/26/2022 10:04:54 PM  Radiology DG Chest 2 View  Result Date: 08/26/2022 CLINICAL DATA:  Shortness of breath, diabetes, hypertension EXAM: CHEST - 2 VIEW COMPARISON:  02/06/2022 FINDINGS: Limited exam because of technique and body habitus. Cardiac silhouette is enlarged as before. Increased interstitial opacities suggesting component of mild interstitial edema. Early CHF is favored. No large effusion. Limited assessment of the lower lobes. No pneumothorax. Trachea midline. Degenerative changes of the spine. IMPRESSION: Mild early CHF pattern. Electronically Signed   By: Jerilynn Mages.  Shick M.D.   On: 08/26/2022 20:48    Procedures Procedures  {Document cardiac  monitor, telemetry assessment procedure when appropriate:1}  Medications Ordered in ED Medications  furosemide (LASIX) injection 40 mg (has no administration in time range)    ED Course/ Medical Decision Making/ A&P                           Medical Decision Making Risk Prescription drug management.   ***  {Document critical care time when appropriate:1} {Document review of labs and clinical decision tools ie heart score, Chads2Vasc2 etc:1}  {Document your independent review of radiology images, and any outside records:1} {Document your discussion with family members, caretakers, and with consultants:1} {Document social determinants of health affecting pt's care:1} {Document your decision making why or why not admission, treatments were needed:1} Final Clinical Impression(s) / ED Diagnoses Final diagnoses:  None    Rx / DC Orders ED Discharge Orders     None

## 2022-08-26 NOTE — ED Triage Notes (Signed)
Pt brought to ED by [facility] with c/o chest pain and shortness of breath since yesterday. EMS states pt has not taken Lasix since Monday, but took lasix today prior to transport. Has significant cardiac hx. EMS reports diminished lung sounds. EMS reports LBBB and initial O2 saturations 94% RA.  Pt placed on 4L via [place], states some symptoms have resolved since O2 supplementation.    EMS Vitals BP 155/92 HR 74 RR 22 SPO2 99% 4L [place]

## 2022-08-26 NOTE — ED Provider Triage Note (Signed)
Emergency Medicine Provider Triage Evaluation Note  Maureen Jackson , a 66 y.o. female  was evaluated in triage.  Pt complains of shortness of breath.  Patient has a history of CHF.  She has not been taking her Lasix since Monday.  She took it earlier today.  States she did not take her Lasix because she felt like she needed.  Denies chest pain.  Review of Systems  Positive: As above Negative: Chest pain, cough  Physical Exam  BP (!) 151/84 (BP Location: Left Arm)   Pulse 84   Temp 97.6 F (36.4 C) (Oral)   Resp 20   SpO2 96%  Gen:   Awake, no distress, obese Resp:  Normal effort  MSK:   Moves extremities without difficulty, bilateral lower extremity pitting edema Other:  Diminished lung sounds  Medical Decision Making  Medically screening exam initiated at 8:21 PM.  Appropriate orders placed.  Maureen Jackson was informed that the remainder of the evaluation will be completed by another provider, this initial triage assessment does not replace that evaluation, and the importance of remaining in the ED until their evaluation is complete.  Shortness of breath work-up   Maureen Jackson, Maureen Jackson 08/26/22 2023

## 2022-08-27 LAB — TROPONIN I (HIGH SENSITIVITY)
Troponin I (High Sensitivity): 24 ng/L — ABNORMAL HIGH (ref <18)
Troponin I (High Sensitivity): 21 ng/L — ABNORMAL HIGH (ref <18)

## 2022-08-27 NOTE — ED Notes (Signed)
The pt has been discharged but her daughter cannot come and get her until 1200 n today  the pt has difficulty walking

## 2022-08-27 NOTE — Discharge Instructions (Signed)
You were seen today for shortness of breath and chest discomfort.  Your presentation is consistent with a heart failure exacerbation.  It is very important that you take your daily Lasix as directed by your doctor.

## 2022-08-27 NOTE — ED Notes (Signed)
Ambulated pt with walker, pt had slow but steady gait. Pt was short of breath, but O2 was 98%

## 2022-08-30 ENCOUNTER — Encounter: Payer: Self-pay | Admitting: *Deleted

## 2022-09-14 ENCOUNTER — Other Ambulatory Visit: Payer: Self-pay | Admitting: "Endocrinology

## 2022-09-14 DIAGNOSIS — E1159 Type 2 diabetes mellitus with other circulatory complications: Secondary | ICD-10-CM

## 2022-09-15 ENCOUNTER — Ambulatory Visit (HOSPITAL_COMMUNITY)
Admission: RE | Admit: 2022-09-15 | Discharge: 2022-09-15 | Disposition: A | Discharge status: Home / Self Care | Payer: Medicare Other | Source: Ambulatory Visit | Attending: Cardiology | Admitting: Cardiology

## 2022-09-15 DIAGNOSIS — I5022 Chronic systolic (congestive) heart failure: Secondary | ICD-10-CM | POA: Diagnosis not present

## 2022-09-15 MED ORDER — PERFLUTREN LIPID MICROSPHERE
1.0000 mL | INTRAVENOUS | Status: AC | PRN
  Administered 2022-09-15: 5 mL via INTRAVENOUS

## 2022-09-15 NOTE — Progress Notes (Signed)
*  PRELIMINARY RESULTS* Echocardiogram Limited 2-D Echocardiogram  has been performed with [facility].  Samuel Germany 09/15/2022, 9:54 AM

## 2022-09-18 ENCOUNTER — Telehealth: Payer: Self-pay

## 2022-09-18 NOTE — Telephone Encounter (Signed)
Re-verbalized results to pt who voiced understanding.

## 2022-09-18 NOTE — Telephone Encounter (Signed)
-----   Message from Arnoldo Lenis, MD sent at 09/18/2022 10:18 AM EDT ----- Heart function remains decreased, continue current meds. Does she recall why her pcp had her stop taking jardiance? May be something we need to restart for her in the near future  Zandra Abts MD

## 2022-09-18 NOTE — Telephone Encounter (Signed)
Spoke to patient who verbalized understanding. Patient stated that her PCP restarted her on Jardiance 10 mg tablets last week.

## 2022-09-18 NOTE — Telephone Encounter (Signed)
Pt states she was half asleep when she first spoke to someone about her results and would like to speak to her again. Transferred to [place], [place].

## 2022-11-16 ENCOUNTER — Other Ambulatory Visit: Payer: Self-pay

## 2022-11-16 DIAGNOSIS — E1159 Type 2 diabetes mellitus with other circulatory complications: Secondary | ICD-10-CM

## 2022-11-16 MED ORDER — NOVOLOG FLEXPEN 100 UNIT/ML ~~LOC~~ SOPN: PEN_INJECTOR | SUBCUTANEOUS | 1 refills | Status: DC

## 2022-12-08 ENCOUNTER — Encounter (HOSPITAL_COMMUNITY): Payer: Self-pay | Admitting: Emergency Medicine

## 2022-12-08 ENCOUNTER — Emergency Department (HOSPITAL_COMMUNITY): Payer: Medicare Other

## 2022-12-08 ENCOUNTER — Other Ambulatory Visit: Payer: Self-pay

## 2022-12-08 ENCOUNTER — Emergency Department (HOSPITAL_COMMUNITY)
Admission: EM | Admit: 2022-12-08 | Discharge: 2022-12-08 | Disposition: A | Discharge status: Home / Self Care | Payer: Medicare Other | Attending: Emergency Medicine | Admitting: Emergency Medicine

## 2022-12-08 DIAGNOSIS — R600 Localized edema: Secondary | ICD-10-CM | POA: Insufficient documentation

## 2022-12-08 DIAGNOSIS — Z7902 Long term (current) use of antithrombotics/antiplatelets: Secondary | ICD-10-CM | POA: Diagnosis not present

## 2022-12-08 DIAGNOSIS — Z87891 Personal history of nicotine dependence: Secondary | ICD-10-CM | POA: Diagnosis not present

## 2022-12-08 DIAGNOSIS — I2510 Atherosclerotic heart disease of native coronary artery without angina pectoris: Secondary | ICD-10-CM | POA: Insufficient documentation

## 2022-12-08 DIAGNOSIS — Z79899 Other long term (current) drug therapy: Secondary | ICD-10-CM | POA: Diagnosis not present

## 2022-12-08 DIAGNOSIS — R0789 Other chest pain: Secondary | ICD-10-CM | POA: Diagnosis not present

## 2022-12-08 DIAGNOSIS — Z1152 Encounter for screening for COVID-19: Secondary | ICD-10-CM | POA: Insufficient documentation

## 2022-12-08 DIAGNOSIS — I110 Hypertensive heart disease with heart failure: Secondary | ICD-10-CM | POA: Diagnosis not present

## 2022-12-08 DIAGNOSIS — Z8673 Personal history of transient ischemic attack (TIA), and cerebral infarction without residual deficits: Secondary | ICD-10-CM | POA: Diagnosis not present

## 2022-12-08 DIAGNOSIS — E039 Hypothyroidism, unspecified: Secondary | ICD-10-CM | POA: Insufficient documentation

## 2022-12-08 DIAGNOSIS — Z8616 Personal history of COVID-19: Secondary | ICD-10-CM | POA: Insufficient documentation

## 2022-12-08 DIAGNOSIS — R0602 Shortness of breath: Secondary | ICD-10-CM | POA: Diagnosis present

## 2022-12-08 DIAGNOSIS — R059 Cough, unspecified: Secondary | ICD-10-CM | POA: Insufficient documentation

## 2022-12-08 DIAGNOSIS — J811 Chronic pulmonary edema: Secondary | ICD-10-CM | POA: Diagnosis not present

## 2022-12-08 DIAGNOSIS — R531 Weakness: Secondary | ICD-10-CM | POA: Insufficient documentation

## 2022-12-08 DIAGNOSIS — I5043 Acute on chronic combined systolic (congestive) and diastolic (congestive) heart failure: Secondary | ICD-10-CM | POA: Insufficient documentation

## 2022-12-08 LAB — COMPREHENSIVE METABOLIC PANEL
ALT: 22 U/L (ref 0–44)
AST: 26 U/L (ref 15–41)
Albumin: 3.2 g/dL — ABNORMAL LOW (ref 3.5–5.0)
Alkaline Phosphatase: 221 U/L — ABNORMAL HIGH (ref 38–126)
Anion gap: 6 (ref 5–15)
BUN: 32 mg/dL — ABNORMAL HIGH (ref 8–23)
CO2: 28 mmol/L (ref 22–32)
Calcium: 8.3 mg/dL — ABNORMAL LOW (ref 8.9–10.3)
Chloride: 110 mmol/L (ref 98–111)
Creatinine, Ser: 1.71 mg/dL — ABNORMAL HIGH (ref 0.44–1.00)
GFR, Estimated: 33 mL/min — ABNORMAL LOW (ref >60)
Glucose, Bld: 146 mg/dL — ABNORMAL HIGH (ref 70–99)
Potassium: 4.4 mmol/L (ref 3.5–5.1)
Sodium: 144 mmol/L (ref 135–145)
Total Bilirubin: 0.8 mg/dL (ref 0.3–1.2)
Total Protein: 7.4 g/dL (ref 6.5–8.1)

## 2022-12-08 LAB — CBC WITH DIFFERENTIAL/PLATELET
Abs Immature Granulocytes: 0.02 10*3/uL (ref 0.00–0.07)
Basophils Absolute: 0 10*3/uL (ref 0.0–0.1)
Basophils Relative: 0 %
Eosinophils Absolute: 0.8 10*3/uL — ABNORMAL HIGH (ref 0.0–0.5)
Eosinophils Relative: 12 %
HCT: 35.6 % — ABNORMAL LOW (ref 36.0–46.0)
Hemoglobin: 10.9 g/dL — ABNORMAL LOW (ref 12.0–15.0)
Immature Granulocytes: 0 %
Lymphocytes Relative: 41 %
Lymphs Abs: 2.7 10*3/uL (ref 0.7–4.0)
MCH: 28 pg (ref 26.0–34.0)
MCHC: 30.6 g/dL (ref 30.0–36.0)
MCV: 91.5 fL (ref 80.0–100.0)
Monocytes Absolute: 0.4 10*3/uL (ref 0.1–1.0)
Monocytes Relative: 6 %
Neutro Abs: 2.8 10*3/uL (ref 1.7–7.7)
Neutrophils Relative %: 41 %
Platelets: 199 10*3/uL (ref 150–400)
RBC: 3.89 MIL/uL (ref 3.87–5.11)
RDW: 15.3 % (ref 11.5–15.5)
WBC: 6.7 10*3/uL (ref 4.0–10.5)
nRBC: 0 % (ref 0.0–0.2)

## 2022-12-08 LAB — URINALYSIS, ROUTINE W REFLEX MICROSCOPIC
Bilirubin Urine: NEGATIVE
Glucose, UA: >=500 mg/dL — AB
Hgb urine dipstick: NEGATIVE
Ketones, ur: NEGATIVE mg/dL
Leukocytes,Ua: NEGATIVE
Nitrite: NEGATIVE
Protein, ur: 30 mg/dL — AB
Specific Gravity, Urine: 1.026 (ref 1.005–1.030)
pH: 5 (ref 5.0–8.0)

## 2022-12-08 LAB — BLOOD GAS, VENOUS
Acid-Base Excess: 7.1 mmol/L — ABNORMAL HIGH (ref 0.0–2.0)
Bicarbonate: 33.9 mmol/L — ABNORMAL HIGH (ref 20.0–28.0)
Drawn by: 7378
O2 Saturation: 33.6 %
Patient temperature: 36.2
pCO2, Ven: 54 mmHg (ref 44–60)
pH, Ven: 7.4 (ref 7.25–7.43)
pO2, Ven: <31 mmHg — CL (ref 32–45)

## 2022-12-08 LAB — PROTIME-INR
INR: 1 (ref 0.8–1.2)
Prothrombin Time: 13.2 seconds (ref 11.4–15.2)

## 2022-12-08 LAB — RESP PANEL BY RT-PCR (RSV, FLU A&B, COVID)  RVPGX2
Influenza A by PCR: NEGATIVE
Influenza B by PCR: NEGATIVE
Resp Syncytial Virus by PCR: NEGATIVE
SARS Coronavirus 2 by RT PCR: NEGATIVE

## 2022-12-08 LAB — TROPONIN I (HIGH SENSITIVITY)
Troponin I (High Sensitivity): 10 ng/L (ref <18)
Troponin I (High Sensitivity): 11 ng/L (ref <18)

## 2022-12-08 LAB — BRAIN NATRIURETIC PEPTIDE: B Natriuretic Peptide: 29 pg/mL (ref 0.0–100.0)

## 2022-12-08 NOTE — ED Provider Notes (Signed)
[facility] Provider Note  CSN: [id number] Arrival date & time: 12/08/22 6283  Chief Complaint(s) Multiple Complaints  HPI Maureen Jackson is a 66 y.o. female with history of CHF, coronary artery disease, diabetes presenting to the emergency department shortness of breath.  She reports shortness of breath with exertion over the past 2 to 3 days which is worsening.  She reports dry cough.  She reports some chest tightness with the shortness of breath.  No nausea or vomiting, no diaphoresis.  No lightheadedness or syncope.  She reports her symptoms improved with rest.  She reports compliance with all of her diuretics.  No productive cough, fevers or chills, sore throat, runny nose.   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 multivessel CAD by cath in 09/2017 and not felt to be a CABG candidate --> underwent two-vessel PCI with DES to the LAD and DES to the RCA   Diabetes mellitus    Diabetes mellitus, type II ([place])    Dyspnea    Glaucoma    Hypertension    Left bundle branch block    Morbid obesity ([place])    Obstructive sleep apnea    does not wear CPAP   Thyroid disease    Patient Active Problem List   Diagnosis Date Noted   Acute on chronic combined systolic (congestive) and diastolic (congestive) heart failure ([place]) 05/18/2020   Acute respiratory disease due to COVID-19 virus 12/26/2019   Pressure injury of skin 09/21/2019   Drug rash    Abscess    Fistula    Fournier's gangrene in female [facility])    Chest pain 07/16/2018   Mixed hyperlipidemia 04/10/2018   TIA (transient ischemic attack) 03/14/2018   Vertigo 03/14/2018   Chronic combined systolic and diastolic CHF (congestive heart failure) (EF 30 to 35 %) 03/14/2018   Ischemic cardiomyopathy 11/06/2017    CAD, multiple vessel    Pericardial effusion 09/07/2017   Acute combined systolic (congestive) and diastolic (congestive) heart failure ([place]) 09/06/2017   Cardiomegaly 09/05/2017   Morbid obesity/BMI > 55 03/04/2013   Labyrinthitis 03/04/2013   DM type 2 causing vascular disease ([place]) 03/04/2013   GASTRITIS 12/13/2006   HIATAL HERNIA, HX OF 12/13/2006   THYROIDECTOMY, HX OF 12/13/2006   Hypothyroidism 10/04/2006   TOBACCO ABUSE 10/04/2006   CARPAL TUNNEL SYNDROME 10/04/2006   Essential hypertension, benign 10/04/2006   GERD 10/04/2006   POSTMENOPAUSAL STATUS 10/04/2006   SKIN TAG 10/04/2006   KNEE PAIN, LEFT 10/04/2006   Sleep apnea 10/04/2006   LEG EDEMA 10/04/2006   Home Medication(s) Prior to Admission medications   Medication Sig Start Date End Date Taking? Authorizing Provider  acetaminophen (TYLENOL) 325 MG tablet Take 2 tablets (650 mg total) by mouth every 4 (four) hours as needed for mild pain, fever or headache. 05/25/20   Emokpae, Courage, MD  amLODipine (NORVASC) 5 MG tablet TAKE 1 TABLET(5 MG) BY MOUTH DAILY Patient taking differently: Take 5 mg by mouth daily. 07/19/22   Arnoldo Lenis, MD  atorvastatin (LIPITOR) 80 MG tablet TAKE 1 TABLET(80 MG) BY MOUTH DAILY Patient taking differently: Take 80 mg by mouth every evening. 05/09/22   Arnoldo Lenis, MD  calcium carbonate (TUMS - DOSED IN MG  ELEMENTAL CALCIUM) 500 MG chewable tablet Chew 4 tablets by mouth every evening.    [provider]  carvedilol (COREG) 12.5 MG tablet TAKE 1 TABLET(12.5 MG) BY MOUTH TWICE DAILY Patient taking differently: Take 12.5 mg by mouth 2 (two) times daily with a meal. 04/21/22   Branch, Alphonse Guild, MD  cetirizine (ZYRTEC) 10 MG tablet Take 10 mg by mouth daily as needed for allergies.  09/12/18   [provider]  clopidogrel (PLAVIX) 75 MG tablet TAKE 1 TABLET(75 MG) BY MOUTH DAILY Patient taking differently: Take 75 mg by mouth daily. 03/27/22   Arnoldo Lenis,  MD  COMBIGAN 0.2-0.5 % ophthalmic solution Apply 1 drop to eye 2 (two) times daily. 02/08/22   [provider]  ENTRESTO 97-103 MG TAKE 1 TABLET BY MOUTH TWICE DAILY Patient taking differently: Take 1 tablet by mouth 2 (two) times daily. 04/21/22   Arnoldo Lenis, MD  furosemide (LASIX) 40 MG tablet Take 1.5 tablets (60 mg total) by mouth 2 (two) times daily. Patient taking differently: Take 40 mg by mouth daily. 02/09/22   Arnoldo Lenis, MD  glucose blood (ONETOUCH VERIO) test strip Test glucose 4 times day. 04/19/22   Cassandria Anger, MD  JARDIANCE 25 MG TABS tablet Take 25 mg by mouth daily. Patient not taking: Reported on 08/26/2022 08/02/22   [provider]  levothyroxine (SYNTHROID) 200 MCG tablet Take 200 mcg by mouth daily before breakfast. Take 1 tablet with 63mg for a total dose of 2574m daily    [provider]  levothyroxine (SYNTHROID) 50 MCG tablet Take 50 mcg by mouth daily before breakfast. Take 1 tablet with 20081mfor a total dose of 250m25maily    [provider]  NOVOLOG FLEXPEN 100 UNIT/ML FlexPen ADMINISTER 10 TO 16 UNITS UNDER THE SKIN THREE TIMES DAILY WITH MEALS 11/16/22   Nida, GebrMarella Chimes  OZEMPIC, 0.25 OR 0.5 MG/DOSE, 2 MG/1.5ML SOPN Inject 0.25 mg into the skin once a week. Thursday 01/10/22   [provider]  senna-docusate (SENOKOT-S) 8.6-50 MG tablet Take 2 tablets by mouth 2 (two) times daily. Patient taking differently: Take 2 tablets by mouth daily as needed for mild constipation or moderate constipation. 05/25/20   EmokRoxan Hockey  spironolactone (ALDACTONE) 25 MG tablet Take 0.5 tablets (12.5 mg total) by mouth daily. Patient not taking: Reported on 08/26/2022 02/09/22   BranArnoldo Lenis  TRESIBA FLEXTOUCH 100 UNIT/ML SOPN FlexTouch Pen Inject 16 Units into the skin at bedtime as needed (for blood glucose greater than 150). 10/08/19   [provider]  VENTOLIN HFA 108 (90 Base) MCG/ACT  inhaler Inhale 1-2 puffs into the lungs every 6 (six) hours as needed for wheezing or shortness of breath. 05/25/20   EmokRoxan Hockey  Past Surgical History Past Surgical History:  Procedure Laterality Date   ABDOMINAL HYSTERECTOMY     CARDIAC CATHETERIZATION  09/12/2017   CORONARY STENT INTERVENTION  09/12/2017   STENT RESOLUTE ONYX 5.42H06 drug eluting stent was successfully placed   CORONARY STENT INTERVENTION N/A 09/12/2017   Procedure: CORONARY STENT INTERVENTION;  Surgeon: Leonie Man, MD;  Location: [facility];  Service: Cardiovascular;  Laterality: N/A;   INCISION AND DRAINAGE PERIRECTAL ABSCESS Left 09/16/2019   Procedure: IRRIGATION AND DEBRIDEMENT LABIA ABSCESS;  Surgeon: Coralie Keens, MD;  Location: [facility];  Service: General;  Laterality: Left;   IR FLUORO GUIDE CV LINE RIGHT  09/16/2019   IR US GUIDE VASC ACCESS RIGHT  09/16/2019   LEFT HEART CATH AND CORONARY ANGIOGRAPHY N/A 09/12/2017   Procedure: LEFT HEART CATH AND CORONARY ANGIOGRAPHY;  Surgeon: Leonie Man, MD;  Location: [facility];  Service: Cardiovascular;  Laterality: N/A;   MASS EXCISION N/A 12/18/2018   Procedure: EXCISION TONGUE MASS;  Surgeon: Leta Baptist, MD;  Location: [facility];  Service: ENT;  Laterality: N/A;   RIGHT/LEFT HEART CATH AND CORONARY ANGIOGRAPHY N/A 09/10/2017   Procedure: RIGHT/LEFT HEART CATH AND CORONARY ANGIOGRAPHY;  Surgeon: Troy Sine, MD;  Location: [facility];  Service: Cardiovascular;  Laterality: N/A;   THYROID SURGERY     Family History Family History  Problem Relation Age of Onset   Diabetes Mother    Hypertension Mother    Cancer Mother        pancreas   Hypertension Sister     Social History Social History   Tobacco Use   Smoking status: Former    Types: Cigarettes    Quit date: 12/14/2005    Years since  quitting: 16.9   Smokeless tobacco: Never   Tobacco comments:    QUIT IN 2005  Vaping Use   Vaping Use: Never used  Substance Use Topics   Alcohol use: No   Drug use: No   Allergies Ampicillin, Aspirin, Hydrocodone, and Unasyn [ampicillin-sulbactam sodium]  Review of Systems Review of Systems  All other systems reviewed and are negative.   Physical Exam Vital Signs  I have reviewed the triage vital signs BP 120/80   Pulse 64   Temp 97.7 F (36.5 C) (Oral)   Resp 17   Ht '5\' 5"'$  (1.651 m)   Wt (!) 161.5 kg   SpO2 94%   BMI 59.25 kg/m  Physical Exam Vitals and nursing note reviewed.  Constitutional:      General: She is not in acute distress.    Appearance: She is well-developed. She is obese.  HENT:     Head: Normocephalic and atraumatic.     Mouth/Throat:     Mouth: Mucous membranes are moist.  Eyes:     Pupils: Pupils are equal, round, and reactive to light.  Cardiovascular:     Rate and Rhythm: Normal rate and regular rhythm.     Heart sounds: No murmur heard. Pulmonary:     Effort: Pulmonary effort is normal.     Comments: Distant due to body habitus, no obvious crackles Abdominal:     General: Abdomen is flat.     Palpations: Abdomen is soft.     Tenderness: There is no abdominal tenderness.  Musculoskeletal:        General: No tenderness.     Right lower leg: Edema present.     Left lower leg: Edema present.  Skin:  General: Skin is warm and dry.  Neurological:     General: No focal deficit present.     Mental Status: She is alert. Mental status is at baseline.  Psychiatric:        Mood and Affect: Mood normal.        Behavior: Behavior normal.     ED Results and Treatments Labs (all labs ordered are listed, but only abnormal results are displayed) Labs Reviewed  COMPREHENSIVE METABOLIC PANEL - Abnormal; Notable for the following components:      Result Value   Glucose, Bld 146 (*)    BUN 32 (*)    Creatinine, Ser 1.71 (*)    Calcium  8.3 (*)    Albumin 3.2 (*)    Alkaline Phosphatase 221 (*)    GFR, Estimated 33 (*)    All other components within normal limits  BLOOD GAS, VENOUS - Abnormal; Notable for the following components:   pO2, Ven <31 (*)    Bicarbonate 33.9 (*)    Acid-Base Excess 7.1 (*)    All other components within normal limits  URINALYSIS, ROUTINE W REFLEX MICROSCOPIC - Abnormal; Notable for the following components:   APPearance HAZY (*)    Glucose, UA >=500 (*)    Protein, ur 30 (*)    Bacteria, UA RARE (*)    All other components within normal limits  CBC WITH DIFFERENTIAL/PLATELET - Abnormal; Notable for the following components:   Hemoglobin 10.9 (*)    HCT 35.6 (*)    Eosinophils Absolute 0.8 (*)    All other components within normal limits  RESP PANEL BY RT-PCR (RSV, FLU A&B, COVID)  RVPGX2  BRAIN NATRIURETIC PEPTIDE  PROTIME-INR  CBC WITH DIFFERENTIAL/PLATELET  TROPONIN I (HIGH SENSITIVITY)  TROPONIN I (HIGH SENSITIVITY)                                                                                                                          Radiology DG Chest Port 1 View  Result Date: 12/08/2022 CLINICAL DATA:  shob EXAM: PORTABLE CHEST 1 VIEW COMPARISON:  Chest x-ray 08/26/2022. FINDINGS: Marked cardiomegaly. Pulmonary vascular congestion. No definite consolidation; however, technique and body habitus at limits assessment. No visible pneumothorax. No acute osseous abnormality. Polyarticular degenerative change. IMPRESSION: Marked cardiomegaly. Pulmonary vascular congestion. No definite consolidation; however, technique and body habitus at limits assessment. Electronically Signed   By: Margaretha Sheffield M.D.   On: 12/08/2022 11:12    Pertinent labs & imaging results that were available during my care of the patient were reviewed by me and considered in my medical decision making (see MDM for details).  Medications Ordered in ED Medications - No data to display  Procedures Ultrasound ED Echo  Date/Time: 12/08/2022 4:27 PM  Performed by: Cristie Hem, MD Authorized by: Cristie Hem, MD   Procedure details:    Indications: dyspnea     Views: parasternal long axis view and IVC view     Images: archived     Limitations:  Body habitus Findings:    Pericardium: small pericardial effusion     IVC: collapsed   Impression:    Impression: pericardial effusion present and probable low CVP     (including critical care time)  Medical Decision Making / ED Course   MDM:  66 year old female presenting to the emergency department with shortness of breath.  Patient well-appearing, borderline hypoxic on room air.  ECG shows stable left bundle branch block.  Differential includes acute CHF, pneumonia, ACS, doubt dissection given mild chest pain which improves with rest, doubt pneumothorax without evidence on CT scan, lower concern for pneumonia with no fevers.  No history of COPD so lower concern for bronchospasm.  Also no wheezing on exam.  Will obtain laboratory workup including troponin and BNP, VBG and reassess.  Chest x-ray does show some signs of pulmonary edema but no crackles present on auscultation  Clinical Course as of 12/08/22 1628  Fri Dec 08, 2022  1513 Delta troponin negative.  Chest x-ray without focal infiltrate.  BNP normal.  Bedside ultrasound performed, limited but able to evaluate, has small pericardial effusion but IVC is collapsible, very low concern for tamponade or occult hypervolemia.  Effusion was noted on previous echo.  Dyspnea may be due to obesity or deconditioning, however given comorbidities and history of coronary disease will place expedited referral for cardiology.  Discussed strict return precautions for any worsening.  Patient also has a mild AKI which will need to be rechecked through cardiology or her  primary doctor. Will discharge patient to home. All questions answered. Patient comfortable with plan of discharge. Return precautions discussed with patient and specified on the after visit summary.  [WS]    Clinical Course User Index [WS] Cristie Hem, MD     Additional history obtained: -External records from outside source obtained and reviewed including: Chart review including previous notes, labs, imaging, consultation notes including ED visit 08/26/22   Lab Tests: -I ordered, reviewed, and interpreted labs.   The pertinent results include:   Labs Reviewed  COMPREHENSIVE METABOLIC PANEL - Abnormal; Notable for the following components:      Result Value   Glucose, Bld 146 (*)    BUN 32 (*)    Creatinine, Ser 1.71 (*)    Calcium 8.3 (*)    Albumin 3.2 (*)    Alkaline Phosphatase 221 (*)    GFR, Estimated 33 (*)    All other components within normal limits  BLOOD GAS, VENOUS - Abnormal; Notable for the following components:   pO2, Ven <31 (*)    Bicarbonate 33.9 (*)    Acid-Base Excess 7.1 (*)    All other components within normal limits  URINALYSIS, ROUTINE W REFLEX MICROSCOPIC - Abnormal; Notable for the following components:   APPearance HAZY (*)    Glucose, UA >=500 (*)    Protein, ur 30 (*)    Bacteria, UA RARE (*)    All other components within normal limits  CBC WITH DIFFERENTIAL/PLATELET - Abnormal; Notable for the following components:   Hemoglobin 10.9 (*)    HCT 35.6 (*)    Eosinophils Absolute 0.8 (*)    All other components within normal  limits  RESP PANEL BY RT-PCR (RSV, FLU A&B, COVID)  RVPGX2  BRAIN NATRIURETIC PEPTIDE  PROTIME-INR  CBC WITH DIFFERENTIAL/PLATELET  TROPONIN I (HIGH SENSITIVITY)  TROPONIN I (HIGH SENSITIVITY)    Notable for mild aki. Normal troponin x2. Mild anemia. Normal BNP   EKG   EKG Interpretation  Date/Time:  Friday December 08 2022 09:48:42 EST Ventricular Rate:  72 PR Interval:  158 QRS Duration: 162 QT  Interval:  452 QTC Calculation: 495 R Axis:   77 Text Interpretation: Sinus rhythm Left bundle branch block Baseline wander in lead(s) II III aVF No significant change since last tracing Confirmed by Garnette Gunner [phone]) on 12/08/2022 11:45:41 AM         Imaging Studies ordered: I ordered imaging studies including CXR On my interpretation imaging demonstrates chronic cardiomegaly I independently visualized and interpreted imaging. I agree with the radiologist interpretation   Medicines ordered and prescription drug management: No orders of the defined types were placed in this encounter.   -I have reviewed the patients home medicines and have made adjustments as needed  Cardiac Monitoring: The patient was maintained on a cardiac monitor.  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 multivessel CAD by cath in 09/2017 and not felt to be a CABG candidate --> underwent two-vessel PCI with DES to the LAD and DES to the RCA   Diabetes mellitus    Diabetes mellitus, type II ([place])    Dyspnea    Glaucoma    Hypertension    Left bundle branch block    Morbid obesity ([place])    Obstructive sleep apnea    does not wear CPAP   Thyroid disease       Dispostion: Disposition decision including need for hospitalization was considered, and patient discharged from emergency department.    Final Clinical Impression(s) /  ED Diagnoses Final diagnoses:  Weakness     This chart was dictated using voice recognition software.  Despite best efforts to proofread,  errors can occur which can change the documentation meaning.    Cristie Hem, MD 12/08/22 [phone]

## 2022-12-08 NOTE — Discharge Instructions (Addendum)
We evaluated you for your weakness.  Your testing including cardiac enzymes and chest x-ray were reassuring.  We did not see any signs of any dangerous problem such as heart failure or heart attack.  Your chest x-ray did not show any pneumonia.  If any of your symptoms worsen, or you develop any new symptoms such as fainting, severe chest pain, worsening shortness of breath, sweating, nausea or vomiting, please return immediately to the emergency department.  Please follow-up as soon as possible with your cardiologist.  We have placed a expedited referral for you.  This should help you get at sooner appointment.

## 2022-12-08 NOTE — ED Triage Notes (Signed)
Pt states she has been sick for the past few days. Pt c/o fatigue, increased shortness of breath when walking around.

## 2022-12-13 ENCOUNTER — Telehealth: Payer: Self-pay | Admitting: Cardiology

## 2022-12-13 NOTE — Telephone Encounter (Signed)
Spoke to patient who stated she has been SOB for a little over a week now since starting Jardiance. Pt stated that she went to the hospital on 12/29 c/o weakness and sob. Pt was told to follow up with [facility] concerning medication. Pt denies cough, fever/chills, sore throat, runny nose, no N/V. Pt stated that she believes it is Jardiance causing problems.   Please advise.

## 2022-12-13 NOTE — Telephone Encounter (Signed)
Patient notified and will hold jardiance. Patient will up date office on Monday of symptoms.

## 2022-12-13 NOTE — Telephone Encounter (Signed)
Pt c/o medication issue:  1. Name of Medication:   JARDIANCE 25 MG TABS tablet   2. How are you currently taking this medication (dosage and times per day)? 1 tablet daily  3. Are you having a reaction (difficulty breathing--STAT)? no  4. What is your medication issue? Patient states she has noticed she has gotten more SOB since starting the jardiance. Patient was not currently SOB on the phone.    Pt c/o Shortness Of Breath: STAT if SOB developed within the last 24 hours or pt is noticeably SOB on the phone  1. Are you currently SOB (can you hear that pt is SOB on the phone)? no  2. How long have you been experiencing SOB? Over a week  3. Are you SOB when sitting or when up moving around? Moving around  4. Are you currently experiencing any other symptoms? No energy

## 2022-12-13 NOTE — Telephone Encounter (Signed)
Not a common listed side effect of jardiance, can she hold until MOnday and then update [place] on symptoms   Zandra Abts MD

## 2022-12-20 ENCOUNTER — Inpatient Hospital Stay (HOSPITAL_COMMUNITY): Payer: 59

## 2022-12-20 ENCOUNTER — Other Ambulatory Visit (HOSPITAL_COMMUNITY): Payer: Self-pay | Admitting: *Deleted

## 2022-12-20 ENCOUNTER — Inpatient Hospital Stay (HOSPITAL_COMMUNITY)
Admission: EM | Admit: 2022-12-20 | Discharge: 2023-01-02 | DRG: 270 | Disposition: A | Discharge status: Skilled Nursing Facility | Payer: 59 | Attending: Family Medicine | Admitting: Family Medicine

## 2022-12-20 ENCOUNTER — Other Ambulatory Visit: Payer: Self-pay

## 2022-12-20 ENCOUNTER — Emergency Department (HOSPITAL_COMMUNITY): Payer: 59

## 2022-12-20 ENCOUNTER — Encounter (HOSPITAL_COMMUNITY): Payer: Self-pay

## 2022-12-20 DIAGNOSIS — E1159 Type 2 diabetes mellitus with other circulatory complications: Secondary | ICD-10-CM | POA: Diagnosis present

## 2022-12-20 DIAGNOSIS — E1151 Type 2 diabetes mellitus with diabetic peripheral angiopathy without gangrene: Secondary | ICD-10-CM | POA: Diagnosis not present

## 2022-12-20 DIAGNOSIS — Z886 Allergy status to analgesic agent status: Secondary | ICD-10-CM

## 2022-12-20 DIAGNOSIS — I255 Ischemic cardiomyopathy: Secondary | ICD-10-CM | POA: Diagnosis present

## 2022-12-20 DIAGNOSIS — Z881 Allergy status to other antibiotic agents status: Secondary | ICD-10-CM

## 2022-12-20 DIAGNOSIS — R0789 Other chest pain: Secondary | ICD-10-CM | POA: Diagnosis not present

## 2022-12-20 DIAGNOSIS — I5043 Acute on chronic combined systolic (congestive) and diastolic (congestive) heart failure: Secondary | ICD-10-CM | POA: Diagnosis not present

## 2022-12-20 DIAGNOSIS — I5022 Chronic systolic (congestive) heart failure: Secondary | ICD-10-CM | POA: Diagnosis not present

## 2022-12-20 DIAGNOSIS — I314 Cardiac tamponade: Secondary | ICD-10-CM | POA: Diagnosis not present

## 2022-12-20 DIAGNOSIS — N179 Acute kidney failure, unspecified: Secondary | ICD-10-CM | POA: Diagnosis not present

## 2022-12-20 DIAGNOSIS — I509 Heart failure, unspecified: Principal | ICD-10-CM

## 2022-12-20 DIAGNOSIS — E785 Hyperlipidemia, unspecified: Secondary | ICD-10-CM | POA: Diagnosis present

## 2022-12-20 DIAGNOSIS — R109 Unspecified abdominal pain: Secondary | ICD-10-CM | POA: Diagnosis not present

## 2022-12-20 DIAGNOSIS — I959 Hypotension, unspecified: Secondary | ICD-10-CM | POA: Diagnosis not present

## 2022-12-20 DIAGNOSIS — E039 Hypothyroidism, unspecified: Secondary | ICD-10-CM | POA: Diagnosis present

## 2022-12-20 DIAGNOSIS — Z8249 Family history of ischemic heart disease and other diseases of the circulatory system: Secondary | ICD-10-CM

## 2022-12-20 DIAGNOSIS — Z79890 Hormone replacement therapy (postmenopausal): Secondary | ICD-10-CM

## 2022-12-20 DIAGNOSIS — Z7401 Bed confinement status: Secondary | ICD-10-CM | POA: Diagnosis not present

## 2022-12-20 DIAGNOSIS — D72829 Elevated white blood cell count, unspecified: Secondary | ICD-10-CM | POA: Diagnosis present

## 2022-12-20 DIAGNOSIS — Z1152 Encounter for screening for COVID-19: Secondary | ICD-10-CM | POA: Diagnosis not present

## 2022-12-20 DIAGNOSIS — R0609 Other forms of dyspnea: Secondary | ICD-10-CM | POA: Diagnosis not present

## 2022-12-20 DIAGNOSIS — Z90710 Acquired absence of both cervix and uterus: Secondary | ICD-10-CM

## 2022-12-20 DIAGNOSIS — I3139 Other pericardial effusion (noninflammatory): Secondary | ICD-10-CM | POA: Diagnosis not present

## 2022-12-20 DIAGNOSIS — I2510 Atherosclerotic heart disease of native coronary artery without angina pectoris: Secondary | ICD-10-CM | POA: Diagnosis present

## 2022-12-20 DIAGNOSIS — E1122 Type 2 diabetes mellitus with diabetic chronic kidney disease: Secondary | ICD-10-CM | POA: Diagnosis present

## 2022-12-20 DIAGNOSIS — Z833 Family history of diabetes mellitus: Secondary | ICD-10-CM

## 2022-12-20 DIAGNOSIS — I10 Essential (primary) hypertension: Secondary | ICD-10-CM | POA: Diagnosis present

## 2022-12-20 DIAGNOSIS — K760 Fatty (change of) liver, not elsewhere classified: Secondary | ICD-10-CM | POA: Diagnosis not present

## 2022-12-20 DIAGNOSIS — K30 Functional dyspepsia: Secondary | ICD-10-CM | POA: Diagnosis not present

## 2022-12-20 DIAGNOSIS — K5900 Constipation, unspecified: Secondary | ICD-10-CM | POA: Diagnosis not present

## 2022-12-20 DIAGNOSIS — I700 Atherosclerosis of aorta: Secondary | ICD-10-CM | POA: Diagnosis present

## 2022-12-20 DIAGNOSIS — Z885 Allergy status to narcotic agent status: Secondary | ICD-10-CM

## 2022-12-20 DIAGNOSIS — Z6841 Body Mass Index (BMI) 40.0 and over, adult: Secondary | ICD-10-CM

## 2022-12-20 DIAGNOSIS — I319 Disease of pericardium, unspecified: Secondary | ICD-10-CM | POA: Diagnosis not present

## 2022-12-20 DIAGNOSIS — G4733 Obstructive sleep apnea (adult) (pediatric): Secondary | ICD-10-CM | POA: Diagnosis present

## 2022-12-20 DIAGNOSIS — E890 Postprocedural hypothyroidism: Secondary | ICD-10-CM | POA: Diagnosis present

## 2022-12-20 DIAGNOSIS — D638 Anemia in other chronic diseases classified elsewhere: Secondary | ICD-10-CM | POA: Diagnosis not present

## 2022-12-20 DIAGNOSIS — Z7902 Long term (current) use of antithrombotics/antiplatelets: Secondary | ICD-10-CM

## 2022-12-20 DIAGNOSIS — Z87891 Personal history of nicotine dependence: Secondary | ICD-10-CM | POA: Diagnosis not present

## 2022-12-20 DIAGNOSIS — Z7984 Long term (current) use of oral hypoglycemic drugs: Secondary | ICD-10-CM

## 2022-12-20 DIAGNOSIS — J939 Pneumothorax, unspecified: Secondary | ICD-10-CM | POA: Diagnosis not present

## 2022-12-20 DIAGNOSIS — K449 Diaphragmatic hernia without obstruction or gangrene: Secondary | ICD-10-CM | POA: Diagnosis not present

## 2022-12-20 DIAGNOSIS — N1831 Chronic kidney disease, stage 3a: Secondary | ICD-10-CM | POA: Diagnosis not present

## 2022-12-20 DIAGNOSIS — R531 Weakness: Secondary | ICD-10-CM | POA: Diagnosis not present

## 2022-12-20 DIAGNOSIS — H409 Unspecified glaucoma: Secondary | ICD-10-CM | POA: Diagnosis present

## 2022-12-20 DIAGNOSIS — I517 Cardiomegaly: Secondary | ICD-10-CM | POA: Diagnosis not present

## 2022-12-20 DIAGNOSIS — Z79899 Other long term (current) drug therapy: Secondary | ICD-10-CM

## 2022-12-20 DIAGNOSIS — M6281 Muscle weakness (generalized): Secondary | ICD-10-CM | POA: Diagnosis not present

## 2022-12-20 DIAGNOSIS — I110 Hypertensive heart disease with heart failure: Secondary | ICD-10-CM | POA: Diagnosis not present

## 2022-12-20 DIAGNOSIS — R0602 Shortness of breath: Secondary | ICD-10-CM | POA: Diagnosis not present

## 2022-12-20 DIAGNOSIS — M1990 Unspecified osteoarthritis, unspecified site: Secondary | ICD-10-CM | POA: Diagnosis present

## 2022-12-20 DIAGNOSIS — G459 Transient cerebral ischemic attack, unspecified: Secondary | ICD-10-CM | POA: Diagnosis not present

## 2022-12-20 DIAGNOSIS — I130 Hypertensive heart and chronic kidney disease with heart failure and stage 1 through stage 4 chronic kidney disease, or unspecified chronic kidney disease: Principal | ICD-10-CM | POA: Diagnosis present

## 2022-12-20 DIAGNOSIS — Z7985 Long-term (current) use of injectable non-insulin antidiabetic drugs: Secondary | ICD-10-CM

## 2022-12-20 DIAGNOSIS — Z91199 Patient's noncompliance with other medical treatment and regimen due to unspecified reason: Secondary | ICD-10-CM

## 2022-12-20 DIAGNOSIS — R300 Dysuria: Secondary | ICD-10-CM | POA: Diagnosis not present

## 2022-12-20 DIAGNOSIS — R293 Abnormal posture: Secondary | ICD-10-CM | POA: Diagnosis not present

## 2022-12-20 DIAGNOSIS — Z955 Presence of coronary angioplasty implant and graft: Secondary | ICD-10-CM

## 2022-12-20 DIAGNOSIS — R269 Unspecified abnormalities of gait and mobility: Secondary | ICD-10-CM | POA: Diagnosis not present

## 2022-12-20 DIAGNOSIS — Z741 Need for assistance with personal care: Secondary | ICD-10-CM | POA: Diagnosis not present

## 2022-12-20 DIAGNOSIS — J90 Pleural effusion, not elsewhere classified: Secondary | ICD-10-CM | POA: Diagnosis not present

## 2022-12-20 LAB — RESP PANEL BY RT-PCR (RSV, FLU A&B, COVID)  RVPGX2
Influenza A by PCR: NEGATIVE
Influenza B by PCR: NEGATIVE
Resp Syncytial Virus by PCR: NEGATIVE
SARS Coronavirus 2 by RT PCR: NEGATIVE

## 2022-12-20 LAB — ECHOCARDIOGRAM COMPLETE
AR max vel: 2.43 cm2
AV Area VTI: 2.3 cm2
AV Area mean vel: 2.03 cm2
AV Mean grad: 2 mmHg
AV Peak grad: 4 mmHg
Ao pk vel: 1 m/s
Area-P 1/2: 4.15 cm2
Height: 66 in
MV VTI: 2.34 cm2
S' Lateral: 4.2 cm
Weight: 5360 oz

## 2022-12-20 LAB — BASIC METABOLIC PANEL
Anion gap: 10 (ref 5–15)
BUN: 21 mg/dL (ref 8–23)
CO2: 25 mmol/L (ref 22–32)
Calcium: 8.5 mg/dL — ABNORMAL LOW (ref 8.9–10.3)
Chloride: 107 mmol/L (ref 98–111)
Creatinine, Ser: 1.47 mg/dL — ABNORMAL HIGH (ref 0.44–1.00)
GFR, Estimated: 40 mL/min — ABNORMAL LOW (ref >60)
Glucose, Bld: 151 mg/dL — ABNORMAL HIGH (ref 70–99)
Potassium: 3.9 mmol/L (ref 3.5–5.1)
Sodium: 142 mmol/L (ref 135–145)

## 2022-12-20 LAB — CBC WITH DIFFERENTIAL/PLATELET
Abs Immature Granulocytes: 0.01 10*3/uL (ref 0.00–0.07)
Basophils Absolute: 0 10*3/uL (ref 0.0–0.1)
Basophils Relative: 0 %
Eosinophils Absolute: 0.4 10*3/uL (ref 0.0–0.5)
Eosinophils Relative: 6 %
HCT: 36.5 % (ref 36.0–46.0)
Hemoglobin: 11.1 g/dL — ABNORMAL LOW (ref 12.0–15.0)
Immature Granulocytes: 0 %
Lymphocytes Relative: 35 %
Lymphs Abs: 2.1 10*3/uL (ref 0.7–4.0)
MCH: 27.8 pg (ref 26.0–34.0)
MCHC: 30.4 g/dL (ref 30.0–36.0)
MCV: 91.5 fL (ref 80.0–100.0)
Monocytes Absolute: 0.3 10*3/uL (ref 0.1–1.0)
Monocytes Relative: 5 %
Neutro Abs: 3.2 10*3/uL (ref 1.7–7.7)
Neutrophils Relative %: 54 %
Platelets: 204 10*3/uL (ref 150–400)
RBC: 3.99 MIL/uL (ref 3.87–5.11)
RDW: 15.4 % (ref 11.5–15.5)
WBC: 5.9 10*3/uL (ref 4.0–10.5)
nRBC: 0 % (ref 0.0–0.2)

## 2022-12-20 LAB — TROPONIN I (HIGH SENSITIVITY)
Troponin I (High Sensitivity): 12 ng/L (ref <18)
Troponin I (High Sensitivity): 10 ng/L (ref <18)

## 2022-12-20 LAB — GLUCOSE, CAPILLARY
Glucose-Capillary: 95 mg/dL (ref 70–99)
Glucose-Capillary: 107 mg/dL — ABNORMAL HIGH (ref 70–99)

## 2022-12-20 LAB — TSH: TSH: 130.159 u[IU]/mL — ABNORMAL HIGH (ref 0.350–4.500)

## 2022-12-20 LAB — BRAIN NATRIURETIC PEPTIDE: B Natriuretic Peptide: 74 pg/mL (ref 0.0–100.0)

## 2022-12-20 MED ORDER — TIMOLOL MALEATE 0.5 % OP SOLN
1.0000 [drp] | Freq: Two times a day (BID) | OPHTHALMIC | Status: DC
  Administered 2022-12-20 – 2023-01-02 (×26): 1 [drp] via OPHTHALMIC
  Filled 2022-12-20 (×2): qty 5

## 2022-12-20 MED ORDER — ALBUTEROL SULFATE (2.5 MG/3ML) 0.083% IN NEBU: 3.0000 mL | INHALATION_SOLUTION | Freq: Four times a day (QID) | RESPIRATORY_TRACT | Status: DC | PRN

## 2022-12-20 MED ORDER — INSULIN ASPART 100 UNIT/ML IJ SOLN
0.0000 [IU] | Freq: Every day | INTRAMUSCULAR | Status: DC
  Administered 2022-12-23: 2 [IU] via SUBCUTANEOUS

## 2022-12-20 MED ORDER — CLOPIDOGREL BISULFATE 75 MG PO TABS
75.0000 mg | ORAL_TABLET | Freq: Every day | ORAL | Status: DC
  Administered 2022-12-20 – 2023-01-02 (×13): 75 mg via ORAL
  Filled 2022-12-20 (×13): qty 1

## 2022-12-20 MED ORDER — ACETAMINOPHEN 325 MG PO TABS
650.0000 mg | ORAL_TABLET | ORAL | Status: DC | PRN
  Administered 2022-12-23: 650 mg via ORAL
  Filled 2022-12-20: qty 2

## 2022-12-20 MED ORDER — ATORVASTATIN CALCIUM 80 MG PO TABS
80.0000 mg | ORAL_TABLET | Freq: Every evening | ORAL | Status: DC
  Administered 2022-12-20 – 2023-01-02 (×14): 80 mg via ORAL
  Filled 2022-12-20 (×6): qty 1
  Filled 2022-12-20: qty 2
  Filled 2022-12-20: qty 1
  Filled 2022-12-20: qty 2
  Filled 2022-12-20 (×5): qty 1

## 2022-12-20 MED ORDER — SODIUM CHLORIDE 0.9% FLUSH
3.0000 mL | Freq: Two times a day (BID) | INTRAVENOUS | Status: DC
  Administered 2022-12-20 – 2023-01-02 (×26): 3 mL via INTRAVENOUS

## 2022-12-20 MED ORDER — AMLODIPINE BESYLATE 5 MG PO TABS
5.0000 mg | ORAL_TABLET | Freq: Every day | ORAL | Status: DC
  Administered 2022-12-20: 5 mg via ORAL
  Filled 2022-12-20: qty 1

## 2022-12-20 MED ORDER — IPRATROPIUM-ALBUTEROL 0.5-2.5 (3) MG/3ML IN SOLN
RESPIRATORY_TRACT | Status: AC
  Filled 2022-12-20: qty 3

## 2022-12-20 MED ORDER — SENNOSIDES-DOCUSATE SODIUM 8.6-50 MG PO TABS
2.0000 | ORAL_TABLET | Freq: Two times a day (BID) | ORAL | Status: DC
  Administered 2022-12-20 – 2023-01-02 (×20): 2 via ORAL
  Filled 2022-12-20 (×23): qty 2

## 2022-12-20 MED ORDER — INSULIN ASPART 100 UNIT/ML IJ SOLN
10.0000 [IU] | Freq: Three times a day (TID) | INTRAMUSCULAR | Status: DC
  Administered 2022-12-21 – 2022-12-24 (×5): 10 [IU] via SUBCUTANEOUS

## 2022-12-20 MED ORDER — HEPARIN SODIUM (PORCINE) 5000 UNIT/ML IJ SOLN
5000.0000 [IU] | Freq: Three times a day (TID) | INTRAMUSCULAR | Status: DC
  Administered 2022-12-20 – 2023-01-02 (×39): 5000 [IU] via SUBCUTANEOUS
  Filled 2022-12-20 (×38): qty 1

## 2022-12-20 MED ORDER — LORATADINE 10 MG PO TABS
10.0000 mg | ORAL_TABLET | Freq: Every day | ORAL | Status: DC
  Administered 2022-12-21 – 2023-01-01 (×6): 10 mg via ORAL
  Filled 2022-12-20 (×9): qty 1

## 2022-12-20 MED ORDER — IOHEXOL 350 MG/ML SOLN
75.0000 mL | Freq: Once | INTRAVENOUS | Status: AC | PRN
  Administered 2022-12-20: 75 mL via INTRAVENOUS

## 2022-12-20 MED ORDER — LEVOTHYROXINE SODIUM 50 MCG PO TABS
50.0000 ug | ORAL_TABLET | Freq: Every day | ORAL | Status: DC
  Administered 2022-12-21 – 2023-01-02 (×11): 50 ug via ORAL
  Filled 2022-12-20 (×12): qty 1

## 2022-12-20 MED ORDER — PERFLUTREN LIPID MICROSPHERE
1.0000 mL | INTRAVENOUS | Status: AC | PRN
  Administered 2022-12-20: 5 mL via INTRAVENOUS

## 2022-12-20 MED ORDER — ONDANSETRON HCL 4 MG/2ML IJ SOLN
4.0000 mg | Freq: Four times a day (QID) | INTRAMUSCULAR | Status: DC | PRN
  Administered 2022-12-27 – 2022-12-31 (×2): 4 mg via INTRAVENOUS
  Filled 2022-12-20 (×2): qty 2

## 2022-12-20 MED ORDER — IPRATROPIUM-ALBUTEROL 0.5-2.5 (3) MG/3ML IN SOLN
3.0000 mL | Freq: Once | RESPIRATORY_TRACT | Status: AC
  Administered 2022-12-20: 3 mL via RESPIRATORY_TRACT
  Filled 2022-12-20: qty 3

## 2022-12-20 MED ORDER — INSULIN ASPART 100 UNIT/ML IJ SOLN
0.0000 [IU] | Freq: Three times a day (TID) | INTRAMUSCULAR | Status: DC
  Administered 2022-12-21: 2 [IU] via SUBCUTANEOUS
  Administered 2022-12-21 – 2022-12-23 (×2): 3 [IU] via SUBCUTANEOUS
  Administered 2022-12-24 (×2): 2 [IU] via SUBCUTANEOUS
  Administered 2022-12-24: 3 [IU] via SUBCUTANEOUS
  Administered 2022-12-25: 1 [IU] via SUBCUTANEOUS
  Administered 2022-12-25: 2 [IU] via SUBCUTANEOUS
  Administered 2022-12-26 – 2022-12-28 (×5): 3 [IU] via SUBCUTANEOUS
  Administered 2022-12-28: 2 [IU] via SUBCUTANEOUS
  Administered 2022-12-29: 5 [IU] via SUBCUTANEOUS
  Administered 2022-12-29 (×2): 3 [IU] via SUBCUTANEOUS
  Administered 2022-12-30: 2 [IU] via SUBCUTANEOUS
  Administered 2022-12-30 – 2022-12-31 (×2): 3 [IU] via SUBCUTANEOUS
  Administered 2023-01-01: 5 [IU] via SUBCUTANEOUS
  Administered 2023-01-02: 3 [IU] via SUBCUTANEOUS
  Administered 2023-01-02: 2 [IU] via SUBCUTANEOUS

## 2022-12-20 MED ORDER — LEVOTHYROXINE SODIUM 100 MCG PO TABS
200.0000 ug | ORAL_TABLET | Freq: Every day | ORAL | Status: DC
  Administered 2022-12-21 – 2023-01-02 (×11): 200 ug via ORAL
  Filled 2022-12-20 (×11): qty 2

## 2022-12-20 MED ORDER — INSULIN ASPART 100 UNIT/ML FLEXPEN: 10.0000 [IU] | PEN_INJECTOR | Freq: Three times a day (TID) | SUBCUTANEOUS | Status: DC

## 2022-12-20 MED ORDER — FUROSEMIDE 10 MG/ML IJ SOLN
40.0000 mg | Freq: Once | INTRAMUSCULAR | Status: AC
  Administered 2022-12-20: 40 mg via INTRAVENOUS
  Filled 2022-12-20: qty 4

## 2022-12-20 MED ORDER — SODIUM CHLORIDE 0.9% FLUSH: 3.0000 mL | INTRAVENOUS | Status: DC | PRN

## 2022-12-20 MED ORDER — ONDANSETRON HCL 4 MG/2ML IJ SOLN: 4.0000 mg | Freq: Four times a day (QID) | INTRAMUSCULAR | Status: DC | PRN

## 2022-12-20 MED ORDER — INSULIN GLARGINE-YFGN 100 UNIT/ML ~~LOC~~ SOLN: 16.0000 [IU] | Freq: Every evening | SUBCUTANEOUS | Status: DC | PRN

## 2022-12-20 MED ORDER — CARVEDILOL 12.5 MG PO TABS
12.5000 mg | ORAL_TABLET | Freq: Two times a day (BID) | ORAL | Status: DC
  Administered 2022-12-20 – 2022-12-22 (×4): 12.5 mg via ORAL
  Filled 2022-12-20 (×4): qty 1

## 2022-12-20 MED ORDER — DICLOFENAC SODIUM 1 % EX GEL: 2.0000 g | Freq: Every day | CUTANEOUS | Status: DC | PRN

## 2022-12-20 MED ORDER — INSULIN DEGLUDEC 100 UNIT/ML ~~LOC~~ SOPN: 16.0000 [IU] | PEN_INJECTOR | Freq: Every evening | SUBCUTANEOUS | Status: DC | PRN

## 2022-12-20 MED ORDER — BRIMONIDINE TARTRATE 0.2 % OP SOLN
1.0000 [drp] | Freq: Two times a day (BID) | OPHTHALMIC | Status: DC
  Administered 2022-12-20 – 2023-01-02 (×26): 1 [drp] via OPHTHALMIC
  Filled 2022-12-20 (×3): qty 5

## 2022-12-20 MED ORDER — FUROSEMIDE 10 MG/ML IJ SOLN
60.0000 mg | Freq: Two times a day (BID) | INTRAMUSCULAR | Status: DC
  Administered 2022-12-20: 60 mg via INTRAVENOUS
  Filled 2022-12-20: qty 6

## 2022-12-20 MED ORDER — SODIUM CHLORIDE 0.9 % IV SOLN: 250.0000 mL | INTRAVENOUS | Status: DC | PRN

## 2022-12-20 MED ORDER — CALCIUM CARBONATE ANTACID 500 MG PO CHEW
4.0000 | CHEWABLE_TABLET | Freq: Every evening | ORAL | Status: DC
  Administered 2022-12-20 – 2023-01-02 (×14): 800 mg via ORAL
  Filled 2022-12-20 (×14): qty 4

## 2022-12-20 MED ORDER — BRIMONIDINE TARTRATE-TIMOLOL 0.2-0.5 % OP SOLN
1.0000 [drp] | Freq: Two times a day (BID) | OPHTHALMIC | Status: DC
  Filled 2022-12-20: qty 5

## 2022-12-20 MED ORDER — SACUBITRIL-VALSARTAN 97-103 MG PO TABS
1.0000 | ORAL_TABLET | Freq: Two times a day (BID) | ORAL | Status: DC
  Administered 2022-12-20: 1 via ORAL
  Filled 2022-12-20 (×4): qty 1

## 2022-12-20 NOTE — ED Triage Notes (Signed)
Pt presents to ED via [facility] for complaints of shortness of breath with exertion x 2 weeks, pt O2 sats 88-89% placed on 2L,

## 2022-12-20 NOTE — H&P (Signed)
History and Physical    Maureen Jackson YTK:[id number] DOB: Feb 03, 1956 DOA: 12/20/2022  PCP: Neale Burly, MD   Patient coming from: Home  Chief Complaint: Dyspnea on exertion  HPI: Maureen Jackson is a 67 y.o. 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  She was seen in the ED on 12/29 for similar, but there was nothing acute found and therefore she was sent home at that time.  She is requiring 2 L nasal cannula oxygen.  Denies any cough, chest pain, fevers, or chills.  She appears to think that the Jardiance had something to do with her symptoms and was told to stop taking this medication several days ago.  Baseline home weight has not changed much and she states that it is close to 330 pounds.   ED Course: Vital signs are stable and patient is afebrile.  Hemoglobin 11.1 and creatinine 1.47 and TSH 130.  CT chest with no PE noted and large pericardial effusion as noted previously.  EDP has spoken with cardiology with plans to diurese and follow while inpatient.  Repeat 2D echocardiogram ordered and pending.  Review of Systems: Reviewed as noted above, otherwise negative.  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 multivessel CAD by cath in 09/2017 and not felt to be a CABG candidate --> underwent two-vessel PCI with DES to the LAD and DES to the RCA   Diabetes mellitus    Diabetes mellitus, type II ([place])    Dyspnea    Glaucoma    Hypertension    Left bundle branch block    Morbid obesity ([place])    Obstructive sleep apnea    does not wear  CPAP   Thyroid disease     Past Surgical History:  Procedure Laterality Date   ABDOMINAL HYSTERECTOMY     CARDIAC CATHETERIZATION  09/12/2017   CORONARY STENT INTERVENTION  09/12/2017   STENT RESOLUTE ONYX 1.70Y17 drug eluting stent was successfully placed   CORONARY STENT INTERVENTION N/A 09/12/2017   Procedure: CORONARY STENT INTERVENTION;  Surgeon: Leonie Man, MD;  Location: [facility];  Service: Cardiovascular;  Laterality: N/A;   INCISION AND DRAINAGE PERIRECTAL ABSCESS Left 09/16/2019   Procedure: IRRIGATION AND DEBRIDEMENT LABIA ABSCESS;  Surgeon: Coralie Keens, MD;  Location: [facility];  Service: General;  Laterality: Left;   IR FLUORO GUIDE CV LINE RIGHT  09/16/2019   IR US GUIDE VASC ACCESS RIGHT  09/16/2019   LEFT HEART CATH AND CORONARY ANGIOGRAPHY N/A 09/12/2017   Procedure: LEFT HEART CATH AND CORONARY ANGIOGRAPHY;  Surgeon: Leonie Man, MD;  Location: [facility];  Service: Cardiovascular;  Laterality: N/A;   MASS EXCISION N/A 12/18/2018   Procedure: EXCISION TONGUE MASS;  Surgeon: Leta Baptist, MD;  Location: [facility];  Service: ENT;  Laterality: N/A;   RIGHT/LEFT HEART CATH AND CORONARY ANGIOGRAPHY N/A 09/10/2017   Procedure: RIGHT/LEFT HEART CATH AND CORONARY ANGIOGRAPHY;  Surgeon: Troy Sine, MD;  Location: [facility];  Service: Cardiovascular;  Laterality: N/A;  THYROID SURGERY       reports that she quit smoking about 17 years ago. Her smoking use included cigarettes. She has never used smokeless tobacco. She reports that she does not drink alcohol and does not use drugs.  Allergies  Allergen Reactions   Ampicillin Other (See Comments)    "Allergic," per [facility]   Aspirin Nausea Only   Hydrocodone Other (See Comments)    "Allergic," per MAR   Unasyn [Ampicillin-Sulbactam Sodium] Rash and Other (See Comments)    "Allergic," per [facility]    Family History  Problem Relation Age of Onset   Diabetes Mother    Hypertension Mother    Cancer  Mother        pancreas   Hypertension Sister     Prior to Admission medications   Medication Sig Start Date End Date Taking? Authorizing Provider  acetaminophen (TYLENOL) 325 MG tablet Take 2 tablets (650 mg total) by mouth every 4 (four) hours as needed for mild pain, fever or headache. 05/25/20   Emokpae, Courage, MD  amLODipine (NORVASC) 5 MG tablet TAKE 1 TABLET(5 MG) BY MOUTH DAILY Patient taking differently: Take 5 mg by mouth daily. 07/19/22   Arnoldo Lenis, MD  atorvastatin (LIPITOR) 80 MG tablet TAKE 1 TABLET(80 MG) BY MOUTH DAILY Patient taking differently: Take 80 mg by mouth every evening. 05/09/22   Arnoldo Lenis, MD  calcium carbonate (TUMS - DOSED IN MG ELEMENTAL CALCIUM) 500 MG chewable tablet Chew 4 tablets by mouth every evening.    [provider]  carvedilol (COREG) 12.5 MG tablet TAKE 1 TABLET(12.5 MG) BY MOUTH TWICE DAILY Patient taking differently: Take 12.5 mg by mouth 2 (two) times daily with a meal. 04/21/22   Branch, Alphonse Guild, MD  cetirizine (ZYRTEC) 10 MG tablet Take 10 mg by mouth daily as needed for allergies.  09/12/18   [provider]  clopidogrel (PLAVIX) 75 MG tablet TAKE 1 TABLET(75 MG) BY MOUTH DAILY Patient taking differently: Take 75 mg by mouth daily. 03/27/22   Arnoldo Lenis, MD  COMBIGAN 0.2-0.5 % ophthalmic solution Apply 1 drop to eye 2 (two) times daily. 02/08/22   [provider]  ENTRESTO 97-103 MG TAKE 1 TABLET BY MOUTH TWICE DAILY Patient taking differently: Take 1 tablet by mouth 2 (two) times daily. 04/21/22   Arnoldo Lenis, MD  furosemide (LASIX) 40 MG tablet Take 1.5 tablets (60 mg total) by mouth 2 (two) times daily. Patient taking differently: Take 40 mg by mouth daily. 02/09/22   Arnoldo Lenis, MD  glucose blood (ONETOUCH VERIO) test strip Test glucose 4 times day. 04/19/22   Cassandria Anger, MD  JARDIANCE 25 MG TABS tablet Take 25 mg by mouth daily. Patient not taking: Reported on  08/26/2022 08/02/22   [provider]  levothyroxine (SYNTHROID) 200 MCG tablet Take 200 mcg by mouth daily before breakfast. Take 1 tablet with 2mg for a total dose of 2537m daily    [provider]  levothyroxine (SYNTHROID) 50 MCG tablet Take 50 mcg by mouth daily before breakfast. Take 1 tablet with 20051mfor a total dose of 250m39maily    [provider]  NOVOLOG FLEXPEN 100 UNIT/ML FlexPen ADMINISTER 10 TO 16 UNITS UNDER THE SKIN THREE TIMES DAILY WITH MEALS 11/16/22   Nida, GebrMarella Chimes  OZEMPIC, 0.25 OR 0.5 MG/DOSE, 2 MG/1.5ML SOPN Inject 0.25 mg into the skin once a week. Thursday 01/10/22   [provider]  senna-docusate (  SENOKOT-S) 8.6-50 MG tablet Take 2 tablets by mouth 2 (two) times daily. Patient taking differently: Take 2 tablets by mouth daily as needed for mild constipation or moderate constipation. 05/25/20   Roxan Hockey, MD  spironolactone (ALDACTONE) 25 MG tablet Take 0.5 tablets (12.5 mg total) by mouth daily. Patient not taking: Reported on 08/26/2022 02/09/22   Arnoldo Lenis, MD  TRESIBA FLEXTOUCH 100 UNIT/ML SOPN FlexTouch Pen Inject 16 Units into the skin at bedtime as needed (for blood glucose greater than 150). 10/08/19   [provider]  VENTOLIN HFA 108 (90 Base) MCG/ACT inhaler Inhale 1-2 puffs into the lungs every 6 (six) hours as needed for wheezing or shortness of breath. 05/25/20   Roxan Hockey, MD    Physical Exam: Vitals:   12/20/22 1028 12/20/22 1030 12/20/22 1100 12/20/22 1130  BP:  126/75 135/84 126/74  Pulse:  72 74 69  Resp:   18 18  Temp: 98.1 F (36.7 C)     TempSrc: Oral     SpO2:  95% 96% 91%  Weight:      Height:        Constitutional: NAD, calm, comfortable, obese Vitals:   12/20/22 1028 12/20/22 1030 12/20/22 1100 12/20/22 1130  BP:  126/75 135/84 126/74  Pulse:  72 74 69  Resp:   18 18  Temp: 98.1 F (36.7 C)     TempSrc: Oral     SpO2:  95% 96% 91%  Weight:       Height:       Eyes: lids and conjunctivae normal Neck: normal, supple Respiratory: clear to auscultation bilaterally. Normal respiratory effort. No accessory muscle use.  2 L nasal cannula Cardiovascular: Regular rate and rhythm, no murmurs. Abdomen: no tenderness, no distention. Bowel sounds positive.  Musculoskeletal: Scant edema left greater than right Skin: no rashes, lesions, ulcers.  Psychiatric: Flat affect  Labs on Admission: I have personally reviewed following labs and imaging studies  CBC: Recent Labs  Lab 12/20/22 0953  WBC 5.9  NEUTROABS 3.2  HGB 11.1*  HCT 36.5  MCV 91.5  PLT 161   Basic Metabolic Panel: Recent Labs  Lab 12/20/22 0953  NA 142  K 3.9  CL 107  CO2 25  GLUCOSE 151*  BUN 21  CREATININE 1.47*  CALCIUM 8.5*   GFR: Estimated Creatinine Clearance: 58.8 mL/min (A) (by C-G formula based on SCr of 1.47 mg/dL (H)). Liver Function Tests: No results for input(s): "AST", "ALT", "ALKPHOS", "BILITOT", "PROT", "ALBUMIN" in the last 168 hours. No results for input(s): "LIPASE", "AMYLASE" in the last 168 hours. No results for input(s): "AMMONIA" in the last 168 hours. Coagulation Profile: No results for input(s): "INR", "PROTIME" in the last 168 hours. Cardiac Enzymes: No results for input(s): "CKTOTAL", "CKMB", "CKMBINDEX", "TROPONINI" in the last 168 hours. BNP (last 3 results) No results for input(s): "PROBNP" in the last 8760 hours. HbA1C: No results for input(s): "HGBA1C" in the last 72 hours. CBG: No results for input(s): "GLUCAP" in the last 168 hours. Lipid Profile: No results for input(s): "CHOL", "HDL", "LDLCALC", "TRIG", "CHOLHDL", "LDLDIRECT" in the last 72 hours. Thyroid Function Tests: Recent Labs    12/20/22 0956  TSH 130.159*   Anemia Panel: No results for input(s): "VITAMINB12", "FOLATE", "FERRITIN", "TIBC", "IRON", "RETICCTPCT" in the last 72 hours. Urine analysis:    Component Value Date/Time   COLORURINE YELLOW  12/08/2022 1050   APPEARANCEUR HAZY (A) 12/08/2022 1050   LABSPEC 1.026 12/08/2022 1050   PHURINE 5.0  12/08/2022 1050   GLUCOSEU >=500 (A) 12/08/2022 1050   HGBUR NEGATIVE 12/08/2022 1050   BILIRUBINUR NEGATIVE 12/08/2022 1050   KETONESUR NEGATIVE 12/08/2022 1050   PROTEINUR 30 (A) 12/08/2022 1050   UROBILINOGEN 0.2 10/02/2015 2320   NITRITE NEGATIVE 12/08/2022 1050   LEUKOCYTESUR NEGATIVE 12/08/2022 1050    Radiological Exams on Admission: CT Angio Chest PE W and/or Wo Contrast  Result Date: 12/20/2022 CLINICAL DATA:  Worsening shortness of breath for 2 weeks. EXAM: CT ANGIOGRAPHY CHEST WITH CONTRAST TECHNIQUE: Multidetector CT imaging of the chest was performed using the standard protocol during bolus administration of intravenous contrast. Multiplanar CT image reconstructions and MIPs were obtained to evaluate the vascular anatomy. RADIATION DOSE REDUCTION: This exam was performed according to the departmental dose-optimization program which includes automated exposure control, adjustment of the mA and/or kV according to patient size and/or use of iterative reconstruction technique. CONTRAST:  23m OMNIPAQUE IOHEXOL 350 MG/ML SOLN COMPARISON:  Chest x-ray 12/20/2022 and older FINDINGS: Cardiovascular: There is a large pericardial effusion. Coronary artery calcifications are seen. The heart itself is nonenlarged. The thoracic aorta is of normal course and caliber with some mild atherosclerotic changes. There is some reflux of contrast into the intrahepatic IVC. With significant breathing motion evaluation of small and peripheral emboli are limited. There is some enlargement of some pulmonary arterial branches. Please correlate for pulmonary arterial hypertension. No large or central embolus. Mediastinum/Nodes: Surgical changes in the thyroid bed. No specific abnormal lymph node enlargement seen in the axillary region, hilum or mediastinum. Small hiatal hernia. Otherwise normal caliber thoracic  esophagus which is slightly patulous. Lungs/Pleura: Breathing motion seen throughout the examination. There is some mild scattered areas of ground-glass in both lungs. There is some enlargement of the pulmonary arteries. No pneumothorax. No consolidation. Trace bilateral pleural fluid. Upper Abdomen: Fatty liver infiltration seen in the upper abdomen. Adrenal glands are preserved. There is a replaced left hepatic artery from the left gastric. Congenital variant. Musculoskeletal: Scattered degenerative changes along the spine with bridging osteophytes and syndesmophytes. Review of the MIP images confirms the above findings. IMPRESSION: Significant breathing motion limits evaluation of the pulmonary arterial tree. No large or central embolus. Large pericardial effusion. There is some reflux of contrast into the intrahepatic IVC. Please correlate for normal cardiac motion. Coronary artery calcifications. Please correlate for other coronary risk factors. Trace bilateral pleural fluid. Aortic Atherosclerosis (ICD10-I70.0). Electronically Signed   By: AJill SideM.D.   On: 12/20/2022 12:35   DG Chest Port 1 View  Result Date: 12/20/2022 CLINICAL DATA:  Short of breath. EXAM: PORTABLE CHEST 1 VIEW COMPARISON:  12/08/2022 and older exams. FINDINGS: Marked enlargement of the cardiopericardial silhouette, unchanged. No mediastinal or hilar masses. Clear lungs.  No convincing pleural effusion or pneumothorax. Skeletal structures are grossly intact. Stable changes from prior thyroid surgery. IMPRESSION: 1. No acute cardiopulmonary disease. 2. Marked cardiomegaly, stable from the prior study. Electronically Signed   By: DLajean ManesM.D.   On: 12/20/2022 10:50    EKG: Independently reviewed. 74bpm, SR, LBBB.  Assessment/Plan Principal Problem:   Acute on chronic combined systolic and diastolic CHF (congestive heart failure) ([facility]) Active Problems:   Hypothyroidism   Essential hypertension, benign   Morbid  obesity/BMI > 55   DM type 2 causing vascular disease ([facility])   Pericardial effusion   CAD, multiple vessel   Ischemic cardiomyopathy   Acute on chronic combined diastolic and systolic CHF exacerbation -2D echocardiogram 09/2022 with LVEF 25% and large pericardial effusion  noted -Repeat 2D echocardiogram -Start IV Lasix for diuresis -States she is compliant with home Lasix with dose recently increased to 60 mg twice daily -Appreciate cardiology evaluation -Baseline weight 330 pounds  CAD with prior DES to LAD and RCA 2018/PAD -Continue current medications  CKD stage IIIa -Baseline creatinine 1-1 0.2  Severe hypothyroidism -Remote past history of thyroidectomy for benign goiter -TSH noted to be 130 -Continue levothyroxine 250 mcg daily -Follow-up with endocrinology Dr. Dorris Fetch  Type 2 diabetes -Most recent A1c 7.3% -Continue Tresiba 50 units at nighttime and NovoLog 10 units 3 times daily with meals -SSI and carb modified diet -Hold Ozempic  Dyslipidemia -Continue atorvastatin 80 mg at nighttime  Hypertension -Continue Entresto, amlodipine, and carvedilol -Monitor carefully on IV Lasix for diuresis  Morbid obesity -BMI 54.07    DVT prophylaxis: Heparin Code Status: Full Family Communication: None at bedside Disposition Plan:Admit for diuresis Consults called:Cardiology Admission status: Inpatient, Tele  Severity of Illness: The appropriate patient status for this patient is INPATIENT. Inpatient status is judged to be reasonable and necessary in order to provide the required intensity of service to ensure the patient's safety. The patient's presenting symptoms, physical exam findings, and initial radiographic and laboratory data in the context of their chronic comorbidities is felt to place them at high risk for further clinical deterioration. Furthermore, it is not anticipated that the patient will be medically stable for discharge from the hospital within 2 midnights of  admission.   * I certify that at the point of admission it is my clinical judgment that the patient will require inpatient hospital care spanning beyond 2 midnights from the point of admission due to high intensity of service, high risk for further deterioration and high frequency of surveillance required.*   Quintasia Theroux D Delesa Kawa DO [facility]  If 7PM-7AM, please contact night-coverage [email or web address]  12/20/2022, 1:28 PM
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
## 2022-12-20 NOTE — Progress Notes (Signed)
Patient declined CPAP. States she doesn't use one at home. No unit in room at this time.

## 2022-12-20 NOTE — Progress Notes (Signed)
*  PRELIMINARY RESULTS* Echocardiogram 2D Echocardiogram has been performed.  Maureen Jackson 12/20/2022, 2:33 PM

## 2022-12-20 NOTE — ED Notes (Signed)
Pt reporting pain and "tight" feeling in her left foot. This was reported prior to neb and did not change during or after neb.

## 2022-12-20 NOTE — ED Provider Notes (Signed)
[facility] Provider Note   CSN: [id number] Arrival date & time: 12/20/22  0944     History  Chief Complaint  Patient presents with   Shortness of Breath    Maureen Jackson is a 67 y.o. female.  Pt is a 67 yo female with a pmhx significant for chf, glaucoma, hypothyroidism, OSA, DM2, obesity, and arthritis.  Pt has been sob for the past 2 weeks.  She did come to the ED on 12/29 for the same.  Eval showed nothing acute, so she was sent home.  She was taken off Jardiance by Dr. Harl Bowie on the 3rd, but that has not improved sx.  Pt said she is very sob with any exertion.  EMS reports O2 sat dropped with 88% with a little exertion.  Pt put on 2L and O2 up to the mid-90s.  Pt denies cough.  No cp.       Home Medications Prior to Admission medications   Medication Sig Start Date End Date Taking? Authorizing Provider  acetaminophen (TYLENOL) 325 MG tablet Take 2 tablets (650 mg total) by mouth every 4 (four) hours as needed for mild pain, fever or headache. 05/25/20   Emokpae, Courage, MD  amLODipine (NORVASC) 5 MG tablet TAKE 1 TABLET(5 MG) BY MOUTH DAILY Patient taking differently: Take 5 mg by mouth daily. 07/19/22   Arnoldo Lenis, MD  atorvastatin (LIPITOR) 80 MG tablet TAKE 1 TABLET(80 MG) BY MOUTH DAILY Patient taking differently: Take 80 mg by mouth every evening. 05/09/22   Arnoldo Lenis, MD  calcium carbonate (TUMS - DOSED IN MG ELEMENTAL CALCIUM) 500 MG chewable tablet Chew 4 tablets by mouth every evening.    [provider]  carvedilol (COREG) 12.5 MG tablet TAKE 1 TABLET(12.5 MG) BY MOUTH TWICE DAILY Patient taking differently: Take 12.5 mg by mouth 2 (two) times daily with a meal. 04/21/22   Branch, Alphonse Guild, MD  cetirizine (ZYRTEC) 10 MG tablet Take 10 mg by mouth daily as needed for allergies.  09/12/18   [provider]  clopidogrel (PLAVIX) 75 MG tablet TAKE 1 TABLET(75 MG) BY MOUTH DAILY Patient taking differently: Take 75 mg  by mouth daily. 03/27/22   Arnoldo Lenis, MD  COMBIGAN 0.2-0.5 % ophthalmic solution Apply 1 drop to eye 2 (two) times daily. 02/08/22   [provider]  ENTRESTO 97-103 MG TAKE 1 TABLET BY MOUTH TWICE DAILY Patient taking differently: Take 1 tablet by mouth 2 (two) times daily. 04/21/22   Arnoldo Lenis, MD  furosemide (LASIX) 40 MG tablet Take 1.5 tablets (60 mg total) by mouth 2 (two) times daily. Patient taking differently: Take 40 mg by mouth daily. 02/09/22   Arnoldo Lenis, MD  glucose blood (ONETOUCH VERIO) test strip Test glucose 4 times day. 04/19/22   Cassandria Anger, MD  JARDIANCE 25 MG TABS tablet Take 25 mg by mouth daily. Patient not taking: Reported on 08/26/2022 08/02/22   [provider]  levothyroxine (SYNTHROID) 200 MCG tablet Take 200 mcg by mouth daily before breakfast. Take 1 tablet with 81mg for a total dose of 2569m daily    [provider]  levothyroxine (SYNTHROID) 50 MCG tablet Take 50 mcg by mouth daily before breakfast. Take 1 tablet with 20056mfor a total dose of 250m86maily    [provider]  NOVOLOG FLEXPEN 100 UNIT/ML FlexPen ADMINISTER 10 TO 16 UNITS UNDER THE SKIN THREE TIMES DAILY WITH MEALS 11/16/22   Nida,  Marella Chimes, MD  OZEMPIC, 0.25 OR 0.5 MG/DOSE, 2 MG/1.5ML SOPN Inject 0.25 mg into the skin once a week. Thursday 01/10/22   [provider]  senna-docusate (SENOKOT-S) 8.6-50 MG tablet Take 2 tablets by mouth 2 (two) times daily. Patient taking differently: Take 2 tablets by mouth daily as needed for mild constipation or moderate constipation. 05/25/20   Roxan Hockey, MD  spironolactone (ALDACTONE) 25 MG tablet Take 0.5 tablets (12.5 mg total) by mouth daily. Patient not taking: Reported on 08/26/2022 02/09/22   Arnoldo Lenis, MD  TRESIBA FLEXTOUCH 100 UNIT/ML SOPN FlexTouch Pen Inject 16 Units into the skin at bedtime as needed (for blood glucose greater than 150). 10/08/19   [provider]  VENTOLIN HFA 108 (90 Base) MCG/ACT inhaler Inhale 1-2 puffs into the lungs every 6 (six) hours as needed for wheezing or shortness of breath. 05/25/20   Roxan Hockey, MD      Allergies    Ampicillin, Aspirin, Hydrocodone, and Unasyn [ampicillin-sulbactam sodium]    Review of Systems   Review of Systems  Respiratory:  Positive for shortness of breath.   All other systems reviewed and are negative.   Physical Exam Updated Vital Signs BP 126/74   Pulse 69   Temp 98.1 F (36.7 C) (Oral)   Resp 18   Ht '5\' 6"'$  (1.676 m)   Wt (!) 152 kg   SpO2 91%   BMI 54.07 kg/m  Physical Exam Vitals and nursing note reviewed.  Constitutional:      Appearance: She is well-developed. She is obese.  HENT:     Head: Normocephalic and atraumatic.     Mouth/Throat:     Mouth: Mucous membranes are moist.     Pharynx: Oropharynx is clear.  Eyes:     Extraocular Movements: Extraocular movements intact.     Pupils: Pupils are equal, round, and reactive to light.  Cardiovascular:     Rate and Rhythm: Normal rate and regular rhythm.  Pulmonary:     Effort: Tachypnea present.     Breath sounds: Decreased breath sounds present.  Abdominal:     General: Bowel sounds are normal.     Palpations: Abdomen is soft.  Musculoskeletal:        General: Normal range of motion.     Cervical back: Normal range of motion and neck supple.  Skin:    General: Skin is warm.     Capillary Refill: Capillary refill takes less than 2 seconds.  Neurological:     General: No focal deficit present.     Mental Status: She is alert and oriented to person, place, and time.  Psychiatric:        Mood and Affect: Mood normal.        Behavior: Behavior normal.     ED Results / Procedures / Treatments   Labs (all labs ordered are listed, but only abnormal results are displayed) Labs Reviewed  BASIC METABOLIC PANEL - Abnormal; Notable for the following components:      Result Value   Glucose, Bld 151  (*)    Creatinine, Ser 1.47 (*)    Calcium 8.5 (*)    GFR, Estimated 40 (*)    All other components within normal limits  CBC WITH DIFFERENTIAL/PLATELET - Abnormal; Notable for the following components:   Hemoglobin 11.1 (*)    All other components within normal limits  TSH - Abnormal; Notable for the following components:   TSH 130.159 (*)  All other components within normal limits  RESP PANEL BY RT-PCR (RSV, FLU A&B, COVID)  [id number]  BRAIN NATRIURETIC PEPTIDE  TROPONIN I (HIGH SENSITIVITY)  TROPONIN I (HIGH SENSITIVITY)    EKG EKG Interpretation  Date/Time:  Wednesday December 20 2022 10:06:53 EST Ventricular Rate:  75 PR Interval:  154 QRS Duration: 163 QT Interval:  435 QTC Calculation: 486 R Axis:   64 Text Interpretation: Sinus rhythm Left bundle branch block Baseline wander in lead(s) V6 No significant change since last tracing Confirmed by Isla Pence [phone]) on 12/20/2022 10:08:31 AM  Radiology CT Angio Chest PE W and/or Wo Contrast  Result Date: 12/20/2022 CLINICAL DATA:  Worsening shortness of breath for 2 weeks. EXAM: CT ANGIOGRAPHY CHEST WITH CONTRAST TECHNIQUE: Multidetector CT imaging of the chest was performed using the standard protocol during bolus administration of intravenous contrast. Multiplanar CT image reconstructions and MIPs were obtained to evaluate the vascular anatomy. RADIATION DOSE REDUCTION: This exam was performed according to the departmental dose-optimization program which includes automated exposure control, adjustment of the mA and/or kV according to patient size and/or use of iterative reconstruction technique. CONTRAST:  29m OMNIPAQUE IOHEXOL 350 MG/ML SOLN COMPARISON:  Chest x-ray 12/20/2022 and older FINDINGS: Cardiovascular: There is a large pericardial effusion. Coronary artery calcifications are seen. The heart itself is nonenlarged. The thoracic aorta is of normal course and caliber with some mild atherosclerotic changes. There is some  reflux of contrast into the intrahepatic IVC. With significant breathing motion evaluation of small and peripheral emboli are limited. There is some enlargement of some pulmonary arterial branches. Please correlate for pulmonary arterial hypertension. No large or central embolus. Mediastinum/Nodes: Surgical changes in the thyroid bed. No specific abnormal lymph node enlargement seen in the axillary region, hilum or mediastinum. Small hiatal hernia. Otherwise normal caliber thoracic esophagus which is slightly patulous. Lungs/Pleura: Breathing motion seen throughout the examination. There is some mild scattered areas of ground-glass in both lungs. There is some enlargement of the pulmonary arteries. No pneumothorax. No consolidation. Trace bilateral pleural fluid. Upper Abdomen: Fatty liver infiltration seen in the upper abdomen. Adrenal glands are preserved. There is a replaced left hepatic artery from the left gastric. Congenital variant. Musculoskeletal: Scattered degenerative changes along the spine with bridging osteophytes and syndesmophytes. Review of the MIP images confirms the above findings. IMPRESSION: Significant breathing motion limits evaluation of the pulmonary arterial tree. No large or central embolus. Large pericardial effusion. There is some reflux of contrast into the intrahepatic IVC. Please correlate for normal cardiac motion. Coronary artery calcifications. Please correlate for other coronary risk factors. Trace bilateral pleural fluid. Aortic Atherosclerosis (ICD10-I70.0). Electronically Signed   By: AJill SideM.D.   On: 12/20/2022 12:35   DG Chest Port 1 View  Result Date: 12/20/2022 CLINICAL DATA:  Short of breath. EXAM: PORTABLE CHEST 1 VIEW COMPARISON:  12/08/2022 and older exams. FINDINGS: Marked enlargement of the cardiopericardial silhouette, unchanged. No mediastinal or hilar masses. Clear lungs.  No convincing pleural effusion or pneumothorax. Skeletal structures are grossly  intact. Stable changes from prior thyroid surgery. IMPRESSION: 1. No acute cardiopulmonary disease. 2. Marked cardiomegaly, stable from the prior study. Electronically Signed   By: DLajean ManesM.D.   On: 12/20/2022 10:50    Procedures Procedures    Medications Ordered in ED Medications  ipratropium-albuterol (DUONEB) 0.5-2.5 (3) MG/3ML nebulizer solution (  Canceled Entry 12/20/22 1102)  furosemide (LASIX) injection 40 mg (has no administration in time range)  ipratropium-albuterol (DUONEB) 0.5-2.5 (3) MG/3ML nebulizer  solution 3 mL (3 mLs Nebulization Given 12/20/22 1101)  iohexol (OMNIPAQUE) 350 MG/ML injection 75 mL (75 mLs Intravenous Contrast Given 12/20/22 1212)    ED Course/ Medical Decision Making/ A&P                           Medical Decision Making Amount and/or Complexity of Data Reviewed Labs: ordered. Radiology: ordered.  Risk Prescription drug management. Decision regarding hospitalization.   This patient presents to the ED for concern of sob, this involves an extensive number of treatment options, and is a complaint that carries with it a high risk of complications and morbidity.  The differential diagnosis includes chf exac, copd, covid/flu/rsv, electrolyte abn   Co morbidities that complicate the patient evaluation  chf, glaucoma, hypothyroidism, OSA, DM2, obesity, and arthritis   Additional history obtained:  Additional history obtained from epic chart review External records from outside source obtained and reviewed including EMS report   Lab Tests:  I Ordered, and personally interpreted labs.  The pertinent results include:  cbc nl, bmp with cr 1.47 (cr 1.71 on 12/29), trop nl, bnp nl; tsh elevated at 130.159; covid/flu neg;    Imaging Studies ordered:  I ordered imaging studies including CXR and ct chest I independently visualized and interpreted imaging which showed  CXR: . No acute cardiopulmonary disease.  2. Marked cardiomegaly, stable from  the prior study.  CT chest: Significant breathing motion limits evaluation of the pulmonary  arterial tree. No large or central embolus.    Large pericardial effusion. There is some reflux of contrast into  the intrahepatic IVC. Please correlate for normal cardiac motion.    Coronary artery calcifications. Please correlate for other coronary  risk factors.    Trace bilateral pleural fluid.    Aortic Atherosclerosis (ICD10-I70.0).   I agree with the radiologist interpretation   Cardiac Monitoring:  The patient was maintained on a cardiac monitor.  I personally viewed and interpreted the cardiac monitored which showed an underlying rhythm of: nsr   Medicines ordered and prescription drug management:  I ordered medication including duoneb  for sob; lasix for sob Reevaluation of the patient after these medicines showed that the patient improved I have reviewed the patients home medicines and have made adjustments as needed   Test Considered:  ct   Critical Interventions:  oxygen   Consultations Obtained:  I requested consultation with the cardiologist (Dr. Dellia Cloud),  and discussed lab and imaging findings as well as pertinent plan - she recommends lasix and echo. She will see pt in consult. Pt d/w Dr. Manuella Ghazi (triad) for admission   Problem List / ED Course:  CHF exac:  echo ordered.  Lasix given.  2L oxygen with O2 sats staying in the mid-90s.   Pericardial effusion:  chronic.  ECHO will also more fully eval. Hypothyroidism:  TSH significantly elevated.  Pt reports compliance with meds.   Reevaluation:  After the interventions noted above, I reevaluated the patient and found that they have :improved   Social Determinants of Health:  Lives at home alone   Dispostion:  After consideration of the diagnostic results and the patients response to treatment, I feel that the patent would benefit from admission.    CRITICAL CARE Performed by: Isla Pence   Total critical care time: 30 minutes  Critical care time was exclusive of separately billable procedures and treating other patients.  Critical care was necessary to treat or prevent imminent  or life-threatening deterioration.  Critical care was time spent personally by me on the following activities: development of treatment plan with patient and/or surrogate as well as nursing, discussions with consultants, evaluation of patient's response to treatment, examination of patient, obtaining history from patient or surrogate, ordering and performing treatments and interventions, ordering and review of laboratory studies, ordering and review of radiographic studies, pulse oximetry and re-evaluation of patient's condition.         Final Clinical Impression(s) / ED Diagnoses Final diagnoses:  Acute on chronic congestive heart failure, unspecified heart failure type ([place])  Pericardial effusion  Hypothyroidism, unspecified type    Rx / DC Orders ED Discharge Orders     None         Isla Pence, MD 12/20/22 1302

## 2022-12-20 NOTE — Plan of Care (Signed)
  Problem: Education: Goal: Knowledge of General Education information will improve Description Including pain rating scale, medication(s)/side effects and non-pharmacologic comfort measures Outcome: Progressing   Problem: Health Behavior/Discharge Planning: Goal: Ability to manage health-related needs will improve Outcome: Progressing   

## 2022-12-21 DIAGNOSIS — I255 Ischemic cardiomyopathy: Secondary | ICD-10-CM | POA: Diagnosis not present

## 2022-12-21 DIAGNOSIS — I314 Cardiac tamponade: Secondary | ICD-10-CM | POA: Diagnosis not present

## 2022-12-21 DIAGNOSIS — I5043 Acute on chronic combined systolic (congestive) and diastolic (congestive) heart failure: Secondary | ICD-10-CM | POA: Diagnosis not present

## 2022-12-21 DIAGNOSIS — I2510 Atherosclerotic heart disease of native coronary artery without angina pectoris: Secondary | ICD-10-CM | POA: Diagnosis not present

## 2022-12-21 LAB — HEMOGLOBIN A1C
Hgb A1c MFr Bld: 7.2 % — ABNORMAL HIGH (ref 4.8–5.6)
Mean Plasma Glucose: 160 mg/dL

## 2022-12-21 LAB — MAGNESIUM: Magnesium: 2.1 mg/dL (ref 1.7–2.4)

## 2022-12-21 LAB — BASIC METABOLIC PANEL
Anion gap: 7 (ref 5–15)
BUN: 24 mg/dL — ABNORMAL HIGH (ref 8–23)
CO2: 29 mmol/L (ref 22–32)
Calcium: 8.3 mg/dL — ABNORMAL LOW (ref 8.9–10.3)
Chloride: 109 mmol/L (ref 98–111)
Creatinine, Ser: 1.64 mg/dL — ABNORMAL HIGH (ref 0.44–1.00)
GFR, Estimated: 35 mL/min — ABNORMAL LOW (ref >60)
Glucose, Bld: 126 mg/dL — ABNORMAL HIGH (ref 70–99)
Potassium: 3.8 mmol/L (ref 3.5–5.1)
Sodium: 145 mmol/L (ref 135–145)

## 2022-12-21 LAB — GLUCOSE, CAPILLARY
Glucose-Capillary: 109 mg/dL — ABNORMAL HIGH (ref 70–99)
Glucose-Capillary: 166 mg/dL — ABNORMAL HIGH (ref 70–99)
Glucose-Capillary: 125 mg/dL — ABNORMAL HIGH (ref 70–99)
Glucose-Capillary: 79 mg/dL (ref 70–99)

## 2022-12-21 LAB — CBC
HCT: 34.1 % — ABNORMAL LOW (ref 36.0–46.0)
Hemoglobin: 10.2 g/dL — ABNORMAL LOW (ref 12.0–15.0)
MCH: 27.9 pg (ref 26.0–34.0)
MCHC: 29.9 g/dL — ABNORMAL LOW (ref 30.0–36.0)
MCV: 93.2 fL (ref 80.0–100.0)
Platelets: 179 10*3/uL (ref 150–400)
RBC: 3.66 MIL/uL — ABNORMAL LOW (ref 3.87–5.11)
RDW: 15.6 % — ABNORMAL HIGH (ref 11.5–15.5)
WBC: 5.5 10*3/uL (ref 4.0–10.5)
nRBC: 0 % (ref 0.0–0.2)

## 2022-12-21 NOTE — Progress Notes (Addendum)
PROGRESS NOTE    Maureen Jackson  ION:[id number] DOB: 1956/05/31 DOA: 12/20/2022 PCP: Neale Burly, MD   Brief Narrative:    Maureen Jackson is a 67 y.o. 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  She was admitted with acute systolic CHF exacerbation with LVEF 30-35% and noted pericardial effusion that is concerning for possible tamponade.  She was seen by cardiology and started on IV diuresis.  Diuresis and antihypertensives held on 1/11 due to softer blood pressure readings.  Patient seen by cardiology with recommendations to transfer to [facility] for cardiothoracic surgery evaluation for pericardial window, but cardiothoracic surgery stating that pericardiocentesis can be performed first by cardiology.  Assessment & Plan:   Principal Problem:   Acute on chronic combined systolic and diastolic CHF (congestive heart failure) (HCC) Active Problems:   Hypothyroidism   Essential hypertension, benign   Morbid obesity/BMI > 55   DM type 2 causing vascular disease (HCC)   Pericardial effusion   CAD, multiple vessel   Ischemic cardiomyopathy  Assessment and Plan:   Acute on chronic systolic CHF exacerbation -Repeat 2D echocardiogram with LVEF 30-35% with poor acoustic windows due to large pericardial effusion-concern for tamponade physiology  -Appreciate cardiology recommendations for transfer to [facility] for cardiology to perform pericardiocentesis in Cath Lab.  Will remain on medicine service. -Patient was started on IV diuresis with Lasix, but held for now -States she is compliant with home Lasix with dose recently increased to 60 mg twice daily -Baseline weight 330 pounds   CAD with prior DES to LAD and RCA  2018/PAD -Continue current medications   AKI on CKD stage IIIa -Creatinine bumped up to 1.64 -Hold further Lasix and Entresto, appears to have softer blood pressure readings -Baseline creatinine 1-1.2 -Continue to monitor a.m. labs   Severe hypothyroidism -Remote past history of thyroidectomy for benign goiter -TSH noted to be 130 -Continue levothyroxine 250 mcg daily -Follow-up with endocrinology Dr. Dorris Fetch   Type 2 diabetes -Most recent A1c 7.3% -Continue Tresiba 50 units at nighttime and NovoLog 10 units 3 times daily with meals -SSI and carb modified diet -Hold Ozempic   Dyslipidemia -Continue atorvastatin 80 mg at nighttime   Hypertension -Hold blood pressure agents of Entresto and amlodipine given soft blood pressure readings -Coreg with holding parameters placed -Hold further IV diuresis   Morbid obesity -BMI 54.07     DVT prophylaxis: Heparin Code Status: Full Family Communication: Brother and sister at bedside 1/11 Disposition Plan:  Status is: Inpatient Remains inpatient appropriate because: Need for IV medications.   Consultants:  Cardiology CTS  Procedures:  None  Antimicrobials:  None   Subjective: Patient seen and evaluated today with improved shortness of breath noted, she was noted to have lower blood pressure readings this morning.  Objective: Vitals:   12/20/22 1450 12/20/22 2229 12/21/22 0206 12/21/22 0526  BP: 126/83 (!) 97/51 (!) 102/50 102/70  Pulse: 76 61 63 64  Resp: '17 20 18 18  '$ Temp: 98.3 F (36.8 C) 97.8 F (36.6 C) 98.8 F (37.1 C) 98.7 F (37.1 C)  TempSrc: Oral     SpO2: 100% 98% 98% 95%  Weight:    (!) 161.5 kg  Height:        Intake/Output Summary (Last  24 hours) at 12/21/2022 1141 Last data filed at 12/21/2022 0500 Gross per 24 hour  Intake 3 ml  Output 800 ml  Net -797 ml   Filed Weights   12/20/22 0952 12/20/22 1444 12/21/22 0526  Weight: (!) 152 kg (!) 159.8 kg (!) 161.5 kg    Examination:  General  exam: Appears calm and comfortable, morbidly obese Respiratory system: Clear to auscultation. Respiratory effort normal.  Nasal cannula oxygen Cardiovascular system: S1 & S2 heard, RRR.  Gastrointestinal system: Abdomen is soft Central nervous system: Alert and awake Extremities: No edema Skin: No significant lesions noted Psychiatry: Flat affect.    Data Reviewed: I have personally reviewed following labs and imaging studies  CBC: Recent Labs  Lab 12/20/22 0953 12/21/22 0435  WBC 5.9 5.5  NEUTROABS 3.2  --   HGB 11.1* 10.2*  HCT 36.5 34.1*  MCV 91.5 93.2  PLT 204 195   Basic Metabolic Panel: Recent Labs  Lab 12/20/22 0953 12/21/22 0435  NA 142 145  K 3.9 3.8  CL 107 109  CO2 25 29  GLUCOSE 151* 126*  BUN 21 24*  CREATININE 1.47* 1.64*  CALCIUM 8.5* 8.3*  MG  --  2.1   GFR: Estimated Creatinine Clearance: 54.8 mL/min (A) (by C-G formula based on SCr of 1.64 mg/dL (H)). Liver Function Tests: No results for input(s): "AST", "ALT", "ALKPHOS", "BILITOT", "PROT", "ALBUMIN" in the last 168 hours. No results for input(s): "LIPASE", "AMYLASE" in the last 168 hours. No results for input(s): "AMMONIA" in the last 168 hours. Coagulation Profile: No results for input(s): "INR", "PROTIME" in the last 168 hours. Cardiac Enzymes: No results for input(s): "CKTOTAL", "CKMB", "CKMBINDEX", "TROPONINI" in the last 168 hours. BNP (last 3 results) No results for input(s): "PROBNP" in the last 8760 hours. HbA1C: Recent Labs    12/20/22 0953  HGBA1C 7.2*   CBG: Recent Labs  Lab 12/20/22 1739 12/20/22 2127 12/21/22 0722 12/21/22 1123  GLUCAP 95 107* 109* 166*   Lipid Profile: No results for input(s): "CHOL", "HDL", "LDLCALC", "TRIG", "CHOLHDL", "LDLDIRECT" in the last 72 hours. Thyroid Function Tests: Recent Labs    12/20/22 0956  TSH 130.159*   Anemia Panel: No results for input(s): "VITAMINB12", "FOLATE", "FERRITIN", "TIBC", "IRON", "RETICCTPCT" in the last 72  hours. Sepsis Labs: No results for input(s): "PROCALCITON", "LATICACIDVEN" in the last 168 hours.  Recent Results (from the past 240 hour(s))  Resp panel by RT-PCR (RSV, Flu A&B, Covid) Anterior Nasal Swab     Status: None   Collection Time: 12/20/22 10:11 AM   Specimen: Anterior Nasal Swab  Result Value Ref Range Status   SARS Coronavirus 2 by RT PCR NEGATIVE NEGATIVE Final    Comment: (NOTE) SARS-CoV-2 target nucleic acids are NOT DETECTED.  The SARS-CoV-2 RNA is generally detectable in upper respiratory specimens during the acute phase of infection. The lowest concentration of SARS-CoV-2 viral copies this assay can detect is 138 copies/mL. A negative result does not preclude SARS-Cov-2 infection and should not be used as the sole basis for treatment or other patient management decisions. A negative result may occur with  improper specimen collection/handling, submission of specimen other than nasopharyngeal swab, presence of viral mutation(s) within the areas targeted by this assay, and inadequate number of viral copies(<138 copies/mL). A negative result must be combined with clinical observations, patient history, and epidemiological information. The expected result is Negative.  Fact Sheet for Patients:  EntrepreneurPulse.com.au  Fact Sheet for Healthcare Providers:  IncredibleEmployment.be  This test is  no t yet approved or cleared by the [place] and  has been authorized for detection and/or diagnosis of SARS-CoV-2 by FDA under an Emergency Use Authorization (EUA). This EUA will remain  in effect (meaning this test can be used) for the duration of the COVID-19 declaration under Section 564(b)(1) of the Act, 21 U.S.C.section 360bbb-3(b)(1), unless the authorization is terminated  or revoked sooner.       Influenza A by PCR NEGATIVE NEGATIVE Final   Influenza B by PCR NEGATIVE NEGATIVE Final    Comment: (NOTE) The Xpert  Xpress SARS-CoV-2/FLU/RSV plus assay is intended as an aid in the diagnosis of influenza from Nasopharyngeal swab specimens and should not be used as a sole basis for treatment. Nasal washings and aspirates are unacceptable for Xpert Xpress SARS-CoV-2/FLU/RSV testing.  Fact Sheet for Patients: EntrepreneurPulse.com.au  Fact Sheet for Healthcare Providers: IncredibleEmployment.be  This test is not yet approved or cleared by the [place] FDA and has been authorized for detection and/or diagnosis of SARS-CoV-2 by FDA under an Emergency Use Authorization (EUA). This EUA will remain in effect (meaning this test can be used) for the duration of the COVID-19 declaration under Section 564(b)(1) of the Act, 21 U.S.C. section 360bbb-3(b)(1), unless the authorization is terminated or revoked.     Resp Syncytial Virus by PCR NEGATIVE NEGATIVE Final    Comment: (NOTE) Fact Sheet for Patients: EntrepreneurPulse.com.au  Fact Sheet for Healthcare Providers: IncredibleEmployment.be  This test is not yet approved or cleared by the [place] FDA and has been authorized for detection and/or diagnosis of SARS-CoV-2 by FDA under an Emergency Use Authorization (EUA). This EUA will remain in effect (meaning this test can be used) for the duration of the COVID-19 declaration under Section 564(b)(1) of the Act, 21 U.S.C. section 360bbb-3(b)(1), unless the authorization is terminated or revoked.  Performed at [facility], [address], [place], [place] [address]          Radiology Studies: ECHOCARDIOGRAM COMPLETE  Result Date: 12/20/2022    ECHOCARDIOGRAM REPORT   Patient Name:   GENOLA YUILLE Date of Exam: 12/20/2022 Medical Rec #:  [id number]       Height:       66.0 in Accession #:    [id number]      Weight:       335.0 lb Date of Birth:  08-Sep-1956        BSA:          2.490 m Patient Age:    42 years        BP:            126/74 mmHg Patient Gender: F               HR:           72 bpm. Exam Location:  [facility] Procedure: 2D Echo, Cardiac Doppler, Color Doppler and Intracardiac            Opacification Agent Indications:    Dyspnea  History:        Patient has prior history of Echocardiogram examinations, most                 recent 03/21/2022. CHF, CAD, TIA, Signs/Symptoms:Chest Pain and                 Edema; Risk Factors:Diabetes, Hypertension, Dyslipidemia and                 Former Smoker.  Sonographer:  Wenda Low Referring Phys: [phone] JULIE HAVILAND  Sonographer Comments: No subcostal window, suboptimal apical window, suboptimal parasternal window, Technically difficult study due to poor echo windows and patient is obese. IMPRESSIONS  1. Poor acoustic windows for evaluation of pericardial effusion. There is a large pericardial effusion lateral to the left ventricle and moderate pericardial effusion anterior to the right ventricle although large effusion cannot be excluded due to poor  windows. No subcostal window. Suboptimal apical window with possible right atrial diastolic collapse but right ventricle is not well visualized.  2. Left ventricular ejection fraction, by estimation, is 30 to 35%. The left ventricle has moderately decreased function. Assessment of regional wall motion is suboptimal despite use of definity. There is akinesis of anteroseptal wall and apex. There is  severe left ventricular hypertrophy. Left ventricular diastolic parameters are indeterminate.  3. Right ventricular systolic function was not well visualized. The right ventricular size is not well visualized.  4. The mitral valve was not well visualized. No evidence of mitral valve regurgitation. No evidence of mitral stenosis.  5. The aortic valve is tricuspid. Aortic valve regurgitation is not visualized. No aortic stenosis is present. Comparison(s): No significant change from prior study. FINDINGS  Left Ventricle: Left ventricular  ejection fraction, by estimation, is 30 to 35%. The left ventricle has moderately decreased function. The left ventricle demonstrates regional wall motion abnormalities. Definity contrast agent was given IV to delineate the left ventricular endocardial borders. The left ventricular internal cavity size was normal in size. There is severe left ventricular hypertrophy. Left ventricular diastolic parameters are indeterminate.  LV Wall Scoring: The anterior septum and apex are akinetic. Right Ventricle: The right ventricular size is not well visualized. Right vetricular wall thickness was not well visualized. Right ventricular systolic function was not well visualized. Left Atrium: Left atrial size was normal in size. Right Atrium: Right atrial size was normal in size. Pericardium: Poor acoustic windows for evaluation of pericardial effusion. There is a large pericardial effusion lateral to the left ventricle and moderate pericardial effusion anterior to the right ventricle although large effusion cannot be excluded due to poor windows. No subcostal window. Suboptimal apical window with possible right atrial diastolic collapse but right ventricle is not well visualized. Mitral Valve: The mitral valve was not well visualized. No evidence of mitral valve regurgitation. No evidence of mitral valve stenosis. MV peak gradient, 2.4 mmHg. The mean mitral valve gradient is 1.0 mmHg. Tricuspid Valve: The tricuspid valve is not well visualized. Tricuspid valve regurgitation is not demonstrated. No evidence of tricuspid stenosis. Aortic Valve: The aortic valve is tricuspid. Aortic valve regurgitation is not visualized. No aortic stenosis is present. Aortic valve mean gradient measures 2.0 mmHg. Aortic valve peak gradient measures 4.0 mmHg. Aortic valve area, by VTI measures 2.30 cm. Pulmonic Valve: The pulmonic valve was not well visualized. Pulmonic valve regurgitation is not visualized. No evidence of pulmonic stenosis. Aorta:  The aortic root is normal in size and structure. Venous: The inferior vena cava was not well visualized. IAS/Shunts: The interatrial septum was not well visualized.  LEFT VENTRICLE PLAX 2D LVIDd:         5.50 cm LVIDs:         4.20 cm LV PW:         1.50 cm LV IVS:        1.60 cm LVOT diam:     2.00 cm LV SV:         45 LV SV Index:  18 LVOT Area:     3.14 cm  LEFT ATRIUM         Index LA diam:    4.00 cm 1.61 cm/m  AORTIC VALVE AV Area (Vmax):    2.43 cm AV Area (Vmean):   2.03 cm AV Area (VTI):     2.30 cm AV Vmax:           99.60 cm/s AV Vmean:          68.500 cm/s AV VTI:            0.197 m AV Peak Grad:      4.0 mmHg AV Mean Grad:      2.0 mmHg LVOT Vmax:         77.00 cm/s LVOT Vmean:        44.300 cm/s LVOT VTI:          0.144 m LVOT/AV VTI ratio: 0.73  AORTA Ao Root diam: 3.30 cm MITRAL VALVE MV Area (PHT): 4.15 cm    SHUNTS MV Area VTI:   2.34 cm    Systemic VTI:  0.14 m MV Peak grad:  2.4 mmHg    Systemic Diam: 2.00 cm MV Mean grad:  1.0 mmHg MV Vmax:       0.78 m/s MV Vmean:      49.2 cm/s MV Decel Time: 183 msec MV E velocity: 79.30 cm/s MV A velocity: 66.80 cm/s MV E/A ratio:  1.19 Vishnu Priya Mallipeddi Electronically signed by Lorelee Cover Mallipeddi Signature Date/Time: 12/20/2022/4:40:34 PM    Final    CT Angio Chest PE W and/or Wo Contrast  Result Date: 12/20/2022 CLINICAL DATA:  Worsening shortness of breath for 2 weeks. EXAM: CT ANGIOGRAPHY CHEST WITH CONTRAST TECHNIQUE: Multidetector CT imaging of the chest was performed using the standard protocol during bolus administration of intravenous contrast. Multiplanar CT image reconstructions and MIPs were obtained to evaluate the vascular anatomy. RADIATION DOSE REDUCTION: This exam was performed according to the departmental dose-optimization program which includes automated exposure control, adjustment of the mA and/or kV according to patient size and/or use of iterative reconstruction technique. CONTRAST:  92m OMNIPAQUE IOHEXOL 350  MG/ML SOLN COMPARISON:  Chest x-ray 12/20/2022 and older FINDINGS: Cardiovascular: There is a large pericardial effusion. Coronary artery calcifications are seen. The heart itself is nonenlarged. The thoracic aorta is of normal course and caliber with some mild atherosclerotic changes. There is some reflux of contrast into the intrahepatic IVC. With significant breathing motion evaluation of small and peripheral emboli are limited. There is some enlargement of some pulmonary arterial branches. Please correlate for pulmonary arterial hypertension. No large or central embolus. Mediastinum/Nodes: Surgical changes in the thyroid bed. No specific abnormal lymph node enlargement seen in the axillary region, hilum or mediastinum. Small hiatal hernia. Otherwise normal caliber thoracic esophagus which is slightly patulous. Lungs/Pleura: Breathing motion seen throughout the examination. There is some mild scattered areas of ground-glass in both lungs. There is some enlargement of the pulmonary arteries. No pneumothorax. No consolidation. Trace bilateral pleural fluid. Upper Abdomen: Fatty liver infiltration seen in the upper abdomen. Adrenal glands are preserved. There is a replaced left hepatic artery from the left gastric. Congenital variant. Musculoskeletal: Scattered degenerative changes along the spine with bridging osteophytes and syndesmophytes. Review of the MIP images confirms the above findings. IMPRESSION: Significant breathing motion limits evaluation of the pulmonary arterial tree. No large or central embolus. Large pericardial effusion. There is some reflux of contrast into the intrahepatic IVC. Please correlate  for normal cardiac motion. Coronary artery calcifications. Please correlate for other coronary risk factors. Trace bilateral pleural fluid. Aortic Atherosclerosis (ICD10-I70.0). Electronically Signed   By: Jill Side M.D.   On: 12/20/2022 12:35   DG Chest Port 1 View  Result Date:  12/20/2022 CLINICAL DATA:  Short of breath. EXAM: PORTABLE CHEST 1 VIEW COMPARISON:  12/08/2022 and older exams. FINDINGS: Marked enlargement of the cardiopericardial silhouette, unchanged. No mediastinal or hilar masses. Clear lungs.  No convincing pleural effusion or pneumothorax. Skeletal structures are grossly intact. Stable changes from prior thyroid surgery. IMPRESSION: 1. No acute cardiopulmonary disease. 2. Marked cardiomegaly, stable from the prior study. Electronically Signed   By: Lajean Manes M.D.   On: 12/20/2022 10:50        Scheduled Meds:  atorvastatin  80 mg Oral QPM   brimonidine  1 drop Both Eyes BID   And   timolol  1 drop Both Eyes BID   calcium carbonate  4 tablet Oral QPM   carvedilol  12.5 mg Oral BID WC   clopidogrel  75 mg Oral Daily   heparin  5,000 Units Subcutaneous Q8H   insulin aspart  0-15 Units Subcutaneous TID WC   insulin aspart  0-5 Units Subcutaneous QHS   insulin aspart  10 Units Subcutaneous TID WC   levothyroxine  200 mcg Oral QAC breakfast   levothyroxine  50 mcg Oral QAC breakfast   loratadine  10 mg Oral Daily   senna-docusate  2 tablet Oral BID   sodium chloride flush  3 mL Intravenous Q12H   Continuous Infusions:  sodium chloride       LOS: 1 day    Time spent: 35 minutes    Eyva Califano Darleen Crocker, DO [facility]  If 7PM-7AM, please contact night-coverage [email or web address] 12/21/2022, 11:41 AM

## 2022-12-21 NOTE — Progress Notes (Signed)
Progress Note  Patient Name: Maureen Jackson Date of Encounter: 12/21/2022  Primary Cardiologist: Carlyle Dolly, MD  Subjective   No acute events overnight.  Patient's blood pressure dropped after starting diuretics.  Diuretics are held.  Inpatient Medications    Scheduled Meds:  atorvastatin  80 mg Oral QPM   brimonidine  1 drop Both Eyes BID   And   timolol  1 drop Both Eyes BID   calcium carbonate  4 tablet Oral QPM   carvedilol  12.5 mg Oral BID WC   clopidogrel  75 mg Oral Daily   heparin  5,000 Units Subcutaneous Q8H   insulin aspart  0-15 Units Subcutaneous TID WC   insulin aspart  0-5 Units Subcutaneous QHS   insulin aspart  10 Units Subcutaneous TID WC   levothyroxine  200 mcg Oral QAC breakfast   levothyroxine  50 mcg Oral QAC breakfast   loratadine  10 mg Oral Daily   senna-docusate  2 tablet Oral BID   sodium chloride flush  3 mL Intravenous Q12H   Continuous Infusions:  sodium chloride     PRN Meds: sodium chloride, acetaminophen, albuterol, diclofenac Sodium, insulin glargine-yfgn, ondansetron (ZOFRAN) IV, sodium chloride flush   Vital Signs    Vitals:   12/20/22 2229 12/21/22 0206 12/21/22 0526 12/21/22 1158  BP: (!) 97/51 (!) 102/50 102/70 123/75  Pulse: 61 63 64 68  Resp: '20 18 18 18  '$ Temp: 97.8 F (36.6 C) 98.8 F (37.1 C) 98.7 F (37.1 C) 98.4 F (36.9 C)  TempSrc:    Oral  SpO2: 98% 98% 95% 98%  Weight:   (!) 161.5 kg   Height:        Intake/Output Summary (Last 24 hours) at 12/21/2022 1305 Last data filed at 12/21/2022 0500 Gross per 24 hour  Intake 3 ml  Output 800 ml  Net -797 ml   Filed Weights   12/20/22 0952 12/20/22 1444 12/21/22 0526  Weight: (!) 152 kg (!) 159.8 kg (!) 161.5 kg    Telemetry    NSR Personally reviewed.  ECG    NSR  Physical Exam   GEN: No acute distress.   Neck: No JVD. Cardiac: Faint heart sounds  Respiratory: Nonlabored. Clear to auscultation bilaterally. GI: Soft, nontender, bowel  sounds present. MS: No edema; No deformity. Neuro:  Nonfocal. Psych: Alert and oriented x 3. Normal affect.  Labs    Chemistry Recent Labs  Lab 12/20/22 0953 12/21/22 0435  NA 142 145  K 3.9 3.8  CL 107 109  CO2 25 29  GLUCOSE 151* 126*  BUN 21 24*  CREATININE 1.47* 1.64*  CALCIUM 8.5* 8.3*  GFRNONAA 40* 35*  ANIONGAP 10 7     Hematology Recent Labs  Lab 12/20/22 0953 12/21/22 0435  WBC 5.9 5.5  RBC 3.99 3.66*  HGB 11.1* 10.2*  HCT 36.5 34.1*  MCV 91.5 93.2  MCH 27.8 27.9  MCHC 30.4 29.9*  RDW 15.4 15.6*  PLT 204 179    Cardiac Enzymes Recent Labs  Lab 12/08/22 1110 12/08/22 1255 12/20/22 0953 12/20/22 1142  TROPONINIHS '10 11 12 10    '$ BNP Recent Labs  Lab 12/20/22 0953  BNP 74.0     DDimerNo results for input(s): "DDIMER" in the last 168 hours.   Radiology    ECHOCARDIOGRAM COMPLETE  Result Date: 12/20/2022    ECHOCARDIOGRAM REPORT   Patient Name:   DEVINE DANT Date of Exam: 12/20/2022 Medical Rec #:  [id number]  Height:       66.0 in Accession #:    [id number]      Weight:       335.0 lb Date of Birth:  03/11/1956        BSA:          2.490 m Patient Age:    34 years        BP:           126/74 mmHg Patient Gender: F               HR:           72 bpm. Exam Location:  [facility] Procedure: 2D Echo, Cardiac Doppler, Color Doppler and Intracardiac            Opacification Agent Indications:    Dyspnea  History:        Patient has prior history of Echocardiogram examinations, most                 recent 03/21/2022. CHF, CAD, TIA, Signs/Symptoms:Chest Pain and                 Edema; Risk Factors:Diabetes, Hypertension, Dyslipidemia and                 Former Smoker.  Sonographer:    Wenda Low Referring Phys: [id number] JULIE HAVILAND  Sonographer Comments: No subcostal window, suboptimal apical window, suboptimal parasternal window, Technically difficult study due to poor echo windows and patient is obese. IMPRESSIONS  1. Poor acoustic windows for  evaluation of pericardial effusion. There is a large pericardial effusion lateral to the left ventricle and moderate pericardial effusion anterior to the right ventricle although large effusion cannot be excluded due to poor  windows. No subcostal window. Suboptimal apical window with possible right atrial diastolic collapse but right ventricle is not well visualized.  2. Left ventricular ejection fraction, by estimation, is 30 to 35%. The left ventricle has moderately decreased function. Assessment of regional wall motion is suboptimal despite use of definity. There is akinesis of anteroseptal wall and apex. There is  severe left ventricular hypertrophy. Left ventricular diastolic parameters are indeterminate.  3. Right ventricular systolic function was not well visualized. The right ventricular size is not well visualized.  4. The mitral valve was not well visualized. No evidence of mitral valve regurgitation. No evidence of mitral stenosis.  5. The aortic valve is tricuspid. Aortic valve regurgitation is not visualized. No aortic stenosis is present. Comparison(s): No significant change from prior study. FINDINGS  Left Ventricle: Left ventricular ejection fraction, by estimation, is 30 to 35%. The left ventricle has moderately decreased function. The left ventricle demonstrates regional wall motion abnormalities. Definity contrast agent was given IV to delineate the left ventricular endocardial borders. The left ventricular internal cavity size was normal in size. There is severe left ventricular hypertrophy. Left ventricular diastolic parameters are indeterminate.  LV Wall Scoring: The anterior septum and apex are akinetic. Right Ventricle: The right ventricular size is not well visualized. Right vetricular wall thickness was not well visualized. Right ventricular systolic function was not well visualized. Left Atrium: Left atrial size was normal in size. Right Atrium: Right atrial size was normal in size.  Pericardium: Poor acoustic windows for evaluation of pericardial effusion. There is a large pericardial effusion lateral to the left ventricle and moderate pericardial effusion anterior to the right ventricle although large effusion cannot be excluded due to poor windows. No subcostal window. Suboptimal apical window  with possible right atrial diastolic collapse but right ventricle is not well visualized. Mitral Valve: The mitral valve was not well visualized. No evidence of mitral valve regurgitation. No evidence of mitral valve stenosis. MV peak gradient, 2.4 mmHg. The mean mitral valve gradient is 1.0 mmHg. Tricuspid Valve: The tricuspid valve is not well visualized. Tricuspid valve regurgitation is not demonstrated. No evidence of tricuspid stenosis. Aortic Valve: The aortic valve is tricuspid. Aortic valve regurgitation is not visualized. No aortic stenosis is present. Aortic valve mean gradient measures 2.0 mmHg. Aortic valve peak gradient measures 4.0 mmHg. Aortic valve area, by VTI measures 2.30 cm. Pulmonic Valve: The pulmonic valve was not well visualized. Pulmonic valve regurgitation is not visualized. No evidence of pulmonic stenosis. Aorta: The aortic root is normal in size and structure. Venous: The inferior vena cava was not well visualized. IAS/Shunts: The interatrial septum was not well visualized.  LEFT VENTRICLE PLAX 2D LVIDd:         5.50 cm LVIDs:         4.20 cm LV PW:         1.50 cm LV IVS:        1.60 cm LVOT diam:     2.00 cm LV SV:         45 LV SV Index:   18 LVOT Area:     3.14 cm  LEFT ATRIUM         Index LA diam:    4.00 cm 1.61 cm/m  AORTIC VALVE AV Area (Vmax):    2.43 cm AV Area (Vmean):   2.03 cm AV Area (VTI):     2.30 cm AV Vmax:           99.60 cm/s AV Vmean:          68.500 cm/s AV VTI:            0.197 m AV Peak Grad:      4.0 mmHg AV Mean Grad:      2.0 mmHg LVOT Vmax:         77.00 cm/s LVOT Vmean:        44.300 cm/s LVOT VTI:          0.144 m LVOT/AV VTI ratio: 0.73   AORTA Ao Root diam: 3.30 cm MITRAL VALVE MV Area (PHT): 4.15 cm    SHUNTS MV Area VTI:   2.34 cm    Systemic VTI:  0.14 m MV Peak grad:  2.4 mmHg    Systemic Diam: 2.00 cm MV Mean grad:  1.0 mmHg MV Vmax:       0.78 m/s MV Vmean:      49.2 cm/s MV Decel Time: 183 msec MV E velocity: 79.30 cm/s MV A velocity: 66.80 cm/s MV E/A ratio:  1.19 [place] Priya Marilynne Dupuis Electronically signed by Lorelee Cover Clydell Sposito Signature Date/Time: 12/20/2022/4:40:34 PM    Final    CT Angio Chest PE W and/or Wo Contrast  Result Date: 12/20/2022 CLINICAL DATA:  Worsening shortness of breath for 2 weeks. EXAM: CT ANGIOGRAPHY CHEST WITH CONTRAST TECHNIQUE: Multidetector CT imaging of the chest was performed using the standard protocol during bolus administration of intravenous contrast. Multiplanar CT image reconstructions and MIPs were obtained to evaluate the vascular anatomy. RADIATION DOSE REDUCTION: This exam was performed according to the departmental dose-optimization program which includes automated exposure control, adjustment of the mA and/or kV according to patient size and/or use of iterative reconstruction technique. CONTRAST:  69m OMNIPAQUE IOHEXOL 350 MG/ML  SOLN COMPARISON:  Chest x-ray 12/20/2022 and older FINDINGS: Cardiovascular: There is a large pericardial effusion. Coronary artery calcifications are seen. The heart itself is nonenlarged. The thoracic aorta is of normal course and caliber with some mild atherosclerotic changes. There is some reflux of contrast into the intrahepatic IVC. With significant breathing motion evaluation of small and peripheral emboli are limited. There is some enlargement of some pulmonary arterial branches. Please correlate for pulmonary arterial hypertension. No large or central embolus. Mediastinum/Nodes: Surgical changes in the thyroid bed. No specific abnormal lymph node enlargement seen in the axillary region, hilum or mediastinum. Small hiatal hernia. Otherwise normal caliber  thoracic esophagus which is slightly patulous. Lungs/Pleura: Breathing motion seen throughout the examination. There is some mild scattered areas of ground-glass in both lungs. There is some enlargement of the pulmonary arteries. No pneumothorax. No consolidation. Trace bilateral pleural fluid. Upper Abdomen: Fatty liver infiltration seen in the upper abdomen. Adrenal glands are preserved. There is a replaced left hepatic artery from the left gastric. Congenital variant. Musculoskeletal: Scattered degenerative changes along the spine with bridging osteophytes and syndesmophytes. Review of the MIP images confirms the above findings. IMPRESSION: Significant breathing motion limits evaluation of the pulmonary arterial tree. No large or central embolus. Large pericardial effusion. There is some reflux of contrast into the intrahepatic IVC. Please correlate for normal cardiac motion. Coronary artery calcifications. Please correlate for other coronary risk factors. Trace bilateral pleural fluid. Aortic Atherosclerosis (ICD10-I70.0). Electronically Signed   By: Jill Side M.D.   On: 12/20/2022 12:35   DG Chest Port 1 View  Result Date: 12/20/2022 CLINICAL DATA:  Short of breath. EXAM: PORTABLE CHEST 1 VIEW COMPARISON:  12/08/2022 and older exams. FINDINGS: Marked enlargement of the cardiopericardial silhouette, unchanged. No mediastinal or hilar masses. Clear lungs.  No convincing pleural effusion or pneumothorax. Skeletal structures are grossly intact. Stable changes from prior thyroid surgery. IMPRESSION: 1. No acute cardiopulmonary disease. 2. Marked cardiomegaly, stable from the prior study. Electronically Signed   By: Lajean Manes M.D.   On: 12/20/2022 10:50    Cardiac studies   Echo from 12/20/2022 LVEF 30 to 35% with RWMA Large pericardial effusion posterior to the LV Moderate pericardial fusion anterior to the RV with RA diastolic collapse and RV not visualized. 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   # Possible cardiac tamponade with evidence of large pericardial effusion posterior to the LV and moderate pericardial fusion anterior to the RV (Poor echo acoustic windows, BMI 57) # Acute systolic heart failure exacerbation, LVEF 30 to 35% with no device # Ischemic cardiomyopathy with no device -Echo from the current admission showed LVEF 30 to 35% with RWMA. Large pericardial effusion is present posterior/lateral to the LV and moderate effusion anterior to the RV. There is right atrial diastolic collapse seen but RV is not well-visualized. There is possible RV collapse during diastole on the Plax views but cannot be confirmed due to poor acoustic windows.  She was started on diuretics on admission (yesterday, 12/20/2022) with a drop in blood pressures and  acute kidney injury indicating possible tamponade as underlying etiology of DOE. I spoke with interventional cardiology on-call, Dr. Irish Lack who suggested patient would not benefit from pericardiocentesis due to moderate effusion anterior to the RV and recommended CT surgery consultation for pericardial window placement (for large effusion posterior to the LV). She will need to be transferred to [facility] in [place], telemetry bed, under medicine team with cardiology/heart failure and CT surgery consultations. She continues to be symptomatic with increased work of breathing at rest. -Hold all the preload reducing agents including diuretics.  # Multivessel CAD s/p LAD and RCA PCI in 2018 with stable Lcx disease (managed medically), angina free   -Continue Plavix 75 mg, indefinitely -Continue atorvastatin 80 mg nightly  # OSA not on CPAP -There was documentation of OSA on the chart but patient has been denying OSA history. She does have all OSA symptoms, snoring, choking sensation during sleep etc.  Outpatient follow-up with PCP.  I have spent a total of 45 minutes with patient reviewing chart , telemetry, EKGs, labs and examining patient as well as establishing an assessment and plan that was discussed with the patient.  > 50% of time was spent in direct patient care.     Signed, Chalmers Guest, MD  12/21/2022, 1:05 PM

## 2022-12-21 NOTE — TOC Initial Note (Signed)
Transition of Care [facility]) - Initial/Assessment Note    Patient Details  Name: Maureen Jackson MRN: [id number] Date of Birth: [birth year]  Transition of Care [facility]) CM/SW Contact:    Iona Beard, [place] Phone Number: 12/21/2022, 9:39 AM  Clinical Narrative:                 [facility] consulted for CHF screen. CSW spoke with pt to complete assessment. Pt states that she lives alone. Pt has an aide come in 3 times a week from [facility]. Pt has transportation provided through [employer]. Pt states that she would like a rollator, CSW to request that MD place order.   Pt states that she does not always take medications as prescribed. Pt does not weigh daily or follow a heart healthy diet. CSW educated pt on importance of weighing daily, taking medications as prescribed, and following a heart healthy diet. TOC to follow.   Expected Discharge Plan: Home/Self Care Barriers to Discharge: Continued Medical Work up   Patient Goals and CMS Choice Patient states their goals for this hospitalization and ongoing recovery are:: return home CMS Medicare.gov Compare Post Acute Care list provided to:: Patient Choice offered to / list presented to : Patient      Expected Discharge Plan and Services In-house Referral: Clinical Social Work Discharge Planning Services: CM Consult Post Acute Care Choice: Durable Medical Equipment Living arrangements for the past 2 months: Apartment                                      Prior Living Arrangements/Services Living arrangements for the past 2 months: Apartment Lives with:: Self Patient language and need for interpreter reviewed:: Yes Do you feel safe going back to the place where you live?: Yes      Need for Family Participation in Patient Care: Yes (Comment) Care giver support system in place?: Yes (comment)   Criminal Activity/Legal Involvement Pertinent to Current Situation/Hospitalization: No - Comment as needed  Activities of Daily Living Home  Assistive Devices/Equipment: Eyeglasses ADL Screening (condition at time of admission) Patient's cognitive ability adequate to safely complete daily activities?: Yes Is the patient deaf or have difficulty hearing?: No Does the patient have difficulty seeing, even when wearing glasses/contacts?: No Does the patient have difficulty concentrating, remembering, or making decisions?: No Patient able to express need for assistance with ADLs?: Yes Does the patient have difficulty dressing or bathing?: No Independently performs ADLs?: Yes (appropriate for developmental age) Does the patient have difficulty walking or climbing stairs?: Yes Weakness of Legs: Both Weakness of Arms/Hands: None  Permission Sought/Granted                  Emotional Assessment Appearance:: Appears stated age Attitude/Demeanor/Rapport: Engaged Affect (typically observed): Accepting Orientation: : Oriented to Self, Oriented to Place, Oriented to  Time, Oriented to Situation Alcohol / Substance Use: Not Applicable Psych Involvement: No (comment)  Admission diagnosis:  Pericardial effusion [I31.39] Acute on chronic combined systolic and diastolic CHF (congestive heart failure) (HCC) [I50.43] Hypothyroidism, unspecified type [E03.9] Acute on chronic congestive heart failure, unspecified heart failure type [facility]) [I50.9] Patient Active Problem List   Diagnosis Date Noted   Acute on chronic combined systolic and diastolic CHF (congestive heart failure) ([place]) 12/20/2022   Acute on chronic combined systolic (congestive) and diastolic (congestive) heart failure ([place]) 05/18/2020   Acute respiratory disease due to COVID-19 virus 12/26/2019  Pressure injury of skin 09/21/2019   Drug rash    Abscess    Fistula    Fournier's gangrene in female [facility])    Chest pain 07/16/2018   Mixed hyperlipidemia 04/10/2018   TIA (transient ischemic attack) 03/14/2018   Vertigo 03/14/2018   Chronic combined systolic and diastolic  CHF (congestive heart failure) (EF 30 to 35 %) 03/14/2018   Ischemic cardiomyopathy 11/06/2017   CAD, multiple vessel    Pericardial effusion 09/07/2017   Acute combined systolic (congestive) and diastolic (congestive) heart failure ([place]) 09/06/2017   Cardiomegaly 09/05/2017   Morbid obesity/BMI > 55 03/04/2013   Labyrinthitis 03/04/2013   DM type 2 causing vascular disease ([place]) 03/04/2013   GASTRITIS 12/13/2006   HIATAL HERNIA, HX OF 12/13/2006   THYROIDECTOMY, HX OF 12/13/2006   Hypothyroidism 10/04/2006   TOBACCO ABUSE 10/04/2006   CARPAL TUNNEL SYNDROME 10/04/2006   Essential hypertension, benign 10/04/2006   GERD 10/04/2006   POSTMENOPAUSAL STATUS 10/04/2006   SKIN TAG 10/04/2006   KNEE PAIN, LEFT 10/04/2006   Sleep apnea 10/04/2006   LEG EDEMA 10/04/2006   PCP:  Neale Burly, MD Pharmacy:   [facility], [place] - [address] AT [address] [place] [address] Phone: [phone] Fax: [phone]     Social Determinants of Health (SDOH) Social History: SDOH Screenings   Food Insecurity: No Food Insecurity (01/01/2020)  Housing: Low Risk  (01/01/2020)  Transportation Needs: Unmet Transportation Needs (01/01/2020)  Depression (PHQ2-9): Low Risk  (01/01/2020)  Tobacco Use: Medium Risk (12/20/2022)   SDOH Interventions:     Readmission Risk Interventions    12/21/2022    9:38 AM 05/19/2020    8:46 AM  Readmission Risk Prevention Plan  Transportation Screening Complete Complete  PCP or Specialist Appt within 3-5 Days  Not Complete  Home Care Screening Complete   Medication Review (RN CM) Complete   HRI or Home Care Consult  Complete  Social Work Consult for [place] Planning/Counseling  Complete  Palliative Care Screening  Not Applicable  Medication Review [employer] photographer)  Complete

## 2022-12-22 ENCOUNTER — Inpatient Hospital Stay (HOSPITAL_COMMUNITY): Payer: 59

## 2022-12-22 ENCOUNTER — Encounter (HOSPITAL_COMMUNITY): Payer: Self-pay | Admitting: Internal Medicine

## 2022-12-22 ENCOUNTER — Encounter (HOSPITAL_COMMUNITY)
Admission: EM | Disposition: A | Discharge status: Skilled Nursing Facility | Payer: Self-pay | Source: Home / Self Care | Attending: Internal Medicine

## 2022-12-22 DIAGNOSIS — I3139 Other pericardial effusion (noninflammatory): Secondary | ICD-10-CM

## 2022-12-22 DIAGNOSIS — I255 Ischemic cardiomyopathy: Secondary | ICD-10-CM

## 2022-12-22 DIAGNOSIS — I5043 Acute on chronic combined systolic (congestive) and diastolic (congestive) heart failure: Secondary | ICD-10-CM | POA: Diagnosis not present

## 2022-12-22 DIAGNOSIS — I5022 Chronic systolic (congestive) heart failure: Secondary | ICD-10-CM

## 2022-12-22 HISTORY — PX: PERICARDIOCENTESIS: CATH118255

## 2022-12-22 LAB — CBC
HCT: 33.8 % — ABNORMAL LOW (ref 36.0–46.0)
Hemoglobin: 10.3 g/dL — ABNORMAL LOW (ref 12.0–15.0)
MCH: 28.1 pg (ref 26.0–34.0)
MCHC: 30.5 g/dL (ref 30.0–36.0)
MCV: 92.1 fL (ref 80.0–100.0)
Platelets: 182 10*3/uL (ref 150–400)
RBC: 3.67 MIL/uL — ABNORMAL LOW (ref 3.87–5.11)
RDW: 15.4 % (ref 11.5–15.5)
WBC: 6.9 10*3/uL (ref 4.0–10.5)
nRBC: 0 % (ref 0.0–0.2)

## 2022-12-22 LAB — BASIC METABOLIC PANEL
Anion gap: 10 (ref 5–15)
BUN: 28 mg/dL — ABNORMAL HIGH (ref 8–23)
CO2: 25 mmol/L (ref 22–32)
Calcium: 8 mg/dL — ABNORMAL LOW (ref 8.9–10.3)
Chloride: 107 mmol/L (ref 98–111)
Creatinine, Ser: 2.14 mg/dL — ABNORMAL HIGH (ref 0.44–1.00)
GFR, Estimated: 25 mL/min — ABNORMAL LOW (ref >60)
Glucose, Bld: 78 mg/dL (ref 70–99)
Potassium: 4.1 mmol/L (ref 3.5–5.1)
Sodium: 142 mmol/L (ref 135–145)

## 2022-12-22 LAB — TYPE AND SCREEN
ABO/RH(D): A POS
Antibody Screen: NEGATIVE

## 2022-12-22 LAB — MAGNESIUM: Magnesium: 2.1 mg/dL (ref 1.7–2.4)

## 2022-12-22 LAB — GLUCOSE, CAPILLARY
Glucose-Capillary: 87 mg/dL (ref 70–99)
Glucose-Capillary: 115 mg/dL — ABNORMAL HIGH (ref 70–99)
Glucose-Capillary: 104 mg/dL — ABNORMAL HIGH (ref 70–99)
Glucose-Capillary: 88 mg/dL (ref 70–99)
Glucose-Capillary: 96 mg/dL (ref 70–99)
Glucose-Capillary: 110 mg/dL — ABNORMAL HIGH (ref 70–99)

## 2022-12-22 LAB — HIV ANTIBODY (ROUTINE TESTING W REFLEX): HIV Screen 4th Generation wRfx: NONREACTIVE

## 2022-12-22 LAB — ECHOCARDIOGRAM LIMITED
Height: 66 in
Height: 66 in
Weight: 5629.67 oz

## 2022-12-22 SURGERY — PERICARDIOCENTESIS
Anesthesia: LOCAL

## 2022-12-22 MED ORDER — SODIUM CHLORIDE 0.9 % IV SOLN: INTRAVENOUS | Status: DC

## 2022-12-22 MED ORDER — LIDOCAINE HCL (PF) 1 % IJ SOLN
INTRAMUSCULAR | Status: DC | PRN
  Administered 2022-12-22: 15 mL

## 2022-12-22 MED ORDER — MIDAZOLAM HCL 2 MG/2ML IJ SOLN
INTRAMUSCULAR | Status: DC | PRN
  Administered 2022-12-22: 2 mg via INTRAVENOUS
  Administered 2022-12-22: 1 mg via INTRAVENOUS

## 2022-12-22 MED ORDER — MIDAZOLAM HCL 2 MG/2ML IJ SOLN
INTRAMUSCULAR | Status: AC
  Filled 2022-12-22: qty 2

## 2022-12-22 MED ORDER — FENTANYL CITRATE (PF) 100 MCG/2ML IJ SOLN
INTRAMUSCULAR | Status: DC | PRN
  Administered 2022-12-22: 50 ug via INTRAVENOUS
  Administered 2022-12-22: 25 ug via INTRAVENOUS

## 2022-12-22 MED ORDER — CHLORHEXIDINE GLUCONATE 0.12 % MT SOLN
15.0000 mL | Freq: Once | OROMUCOSAL | Status: AC
  Administered 2022-12-23: 15 mL via OROMUCOSAL
  Filled 2022-12-22: qty 15

## 2022-12-22 MED ORDER — IOHEXOL 350 MG/ML SOLN
INTRAVENOUS | Status: DC | PRN
  Administered 2022-12-22: 3 mL

## 2022-12-22 MED ORDER — HEPARIN (PORCINE) IN NACL 1000-0.9 UT/500ML-% IV SOLN
INTRAVENOUS | Status: AC
  Filled 2022-12-22: qty 500

## 2022-12-22 MED ORDER — CHLORHEXIDINE GLUCONATE CLOTH 2 % EX PADS
6.0000 | MEDICATED_PAD | Freq: Once | CUTANEOUS | Status: AC
  Administered 2022-12-23: 6 via TOPICAL

## 2022-12-22 MED ORDER — HEPARIN (PORCINE) IN NACL 1000-0.9 UT/500ML-% IV SOLN
INTRAVENOUS | Status: DC | PRN
  Administered 2022-12-22: 500 mL

## 2022-12-22 MED ORDER — LIDOCAINE HCL (PF) 1 % IJ SOLN
INTRAMUSCULAR | Status: AC
  Filled 2022-12-22: qty 30

## 2022-12-22 MED ORDER — CHLORHEXIDINE GLUCONATE CLOTH 2 % EX PADS
6.0000 | MEDICATED_PAD | Freq: Once | CUTANEOUS | Status: AC
  Administered 2022-12-22: 6 via TOPICAL

## 2022-12-22 MED ORDER — FENTANYL CITRATE (PF) 100 MCG/2ML IJ SOLN
INTRAMUSCULAR | Status: AC
  Filled 2022-12-22: qty 2

## 2022-12-22 SURGICAL SUPPLY — 4 items
PACK CARDIAC CATHETERIZATION (CUSTOM PROCEDURE TRAY) IMPLANT
TRAY PERICARDIOCENTESIS 6FX60 (TRAY / TRAY PROCEDURE) IMPLANT
WIRE MICRO SET 5FR 12 (WIRE) IMPLANT
WIRE MICRO SET SILHO 5FR 7 (SHEATH) IMPLANT

## 2022-12-22 NOTE — Progress Notes (Signed)
Pt states she does not want CPAP

## 2022-12-22 NOTE — Interval H&P Note (Signed)
History and Physical Interval Note:  12/22/2022 2:41 PM  Maureen Jackson  has presented today for surgery, with the diagnosis of chest pain.  The various methods of treatment have been discussed with the patient and family. After consideration of risks, benefits and other options for treatment, the patient has consented to  Procedure(s): PERICARDIOCENTESIS (N/A) as a surgical intervention.  The patient's history has been reviewed, patient examined, no change in status, stable for surgery.  I have reviewed the patient's chart and labs.  Questions were answered to the patient's satisfaction.     [name]

## 2022-12-22 NOTE — Progress Notes (Signed)
Heart Failure Nurse Navigator Progress Note  PCP: Neale Burly, MD PCP-Cardiologist: Branch Admission Diagnosis: Acute on chronic congestive heart faiulure Admitted from: Home - EMS   Presentation:   Maureen Jackson presented with shortness of breath, LE edema x 2 weeks, BP 126/74, HR 69, BMI 54.07, IV lasix given, CXR no acute pulmonary disease, marked cardiomegaly. CT chest, with large pleural effusion,   Patient educated on the sign and symptoms of heart failure, daily weights, when to call her doctor or go to the ED, Diet/ fluid restrictions, taking all medications as prescribed and attending all medical appointments, Patient verbalized her understanding, a hospital HF TOC follow up was scheduled for 01/03/2023.   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, he passed"  Tobacco Use   Smoking status: Former    Types: Cigarettes    Quit date: 12/14/2005    Years since quitting: 17.0   Smokeless tobacco: Never   Tobacco comments:    QUIT IN 2005  Vaping Use   Vaping Use: Never used  Substance and Sexual Activity   Alcohol use: No   Drug use: No    Sexual activity: Never  Other Topics Concern   Not on file  Social History Narrative   Not on file   Social Determinants of Health   Financial Resource Strain: Low Risk  (12/22/2022)   Overall Financial Resource Strain (CARDIA)    Difficulty of Paying Living Expenses: Not very hard  Food Insecurity: No Food Insecurity (01/01/2020)   Hunger Vital Sign    Worried About Running Out of Food in the Last Year: Never true    [place] in the Last Year: Never true  Transportation Needs: No Transportation Needs (12/22/2022)   PRAPARE - Hydrologist (Medical): No    Lack of Transportation (Non-Medical): No  Physical Activity: Not on file  Stress: Not on file  Social Connections: Not on file   Education Assessment and Provision:  Detailed education and instructions provided on heart failure disease management including the following:  Signs and symptoms of Heart Failure When to call the physician Importance of daily weights Low sodium diet Fluid restriction Medication management Anticipated future follow-up appointments  Patient education given on each of the above topics.  Patient acknowledges understanding via teach back method and acceptance of all instructions.  Education Materials:  "Living Better With  Heart Failure" Booklet, HF zone tool, & Daily Weight Tracker Tool.  Patient has scale at home: yes Patient has pill box at home: NA    High Risk Criteria for Readmission and/or Poor Patient Outcomes: Heart failure hospital admissions (last 6 months): 2  No Show rate: 7% Difficult social situation: No Demonstrates medication adherence: Yes Primary Language: English Literacy level: Reading, writing, and comprehension  Barriers of Care:   Continued HF education Diet/ fluid/ daily weights  Considerations/Referrals:   Referral made to Heart Failure Pharmacist Stewardship: Yes Referral made to Heart Failure CSW/NCM TOC: No Referral made to  Heart & Vascular TOC clinic: Yes, 01/03/23  Items for Follow-up on DC/TOC: Continued HF education Diet/ fluids/ daily weights   Earnestine Leys, BSN, RN Heart Failure Transport planner Only

## 2022-12-22 NOTE — Care Management Important Message (Signed)
Important Message  Patient Details  Name: Maureen Jackson MRN: [id number] Date of Birth: 1956-08-02   Medicare Important Message Given:  Yes     Shelda Altes 12/22/2022, 8:46 AM

## 2022-12-22 NOTE — Progress Notes (Signed)
  Echocardiogram 2D Echocardiogram has been performed.  Darlina Sicilian M 12/22/2022, 10:59 AM

## 2022-12-22 NOTE — Consult Note (Signed)
[address]       [place] [address]             [phone]           Debbe N Dials [facility] Medical Record #[id number] Date of Birth: 13-Jun-1956  No ref. provider found Neale Burly, MD  Chief Complaint:    Chief Complaint  Patient presents with   Shortness of Breath    History of Present Illness:     67 yo with CAD sp PCI and ischemic cardiomyopathy, DM , obesity who was admitted with SOB and on CT of chest was noted to have large pericardial effusion. Pt had this confirmed by echo again with EF 35%. Pt on plavix and so was attempted to have percardiocentesis by IC today however secondary to pt's habitus was unable to gain access. 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 multivessel CAD by cath in 09/2017 and not felt to be a CABG candidate --> underwent two-vessel PCI with DES to the LAD and DES to the RCA   Diabetes mellitus    Diabetes mellitus, type II ([place])    Dyspnea    Glaucoma    Hypertension    Left bundle branch block    Morbid obesity ([place])    Obstructive sleep apnea    does not wear CPAP   Thyroid disease     Past Surgical History:  Procedure Laterality Date   ABDOMINAL HYSTERECTOMY     CARDIAC CATHETERIZATION  09/12/2017   CORONARY STENT INTERVENTION  09/12/2017   STENT RESOLUTE ONYX 6.83M19 drug eluting stent was successfully placed   CORONARY STENT INTERVENTION N/A 09/12/2017   Procedure: CORONARY STENT INTERVENTION;  Surgeon: Leonie Man, MD;  Location: [facility];  Service: Cardiovascular;  Laterality: N/A;   INCISION AND DRAINAGE PERIRECTAL ABSCESS Left 09/16/2019   Procedure: IRRIGATION AND DEBRIDEMENT LABIA ABSCESS;  Surgeon: Coralie Keens, MD;  Location: [facility];  Service:  General;  Laterality: Left;   IR FLUORO GUIDE CV LINE RIGHT  09/16/2019   IR US GUIDE VASC ACCESS RIGHT  09/16/2019   LEFT HEART CATH AND CORONARY ANGIOGRAPHY N/A 09/12/2017   Procedure: LEFT HEART CATH AND CORONARY ANGIOGRAPHY;  Surgeon: Leonie Man, MD;  Location: [facility];  Service: Cardiovascular;  Laterality: N/A;   MASS EXCISION N/A 12/18/2018   Procedure: EXCISION TONGUE MASS;  Surgeon: Leta Baptist, MD;  Location: [facility];  Service: ENT;  Laterality: N/A;   RIGHT/LEFT HEART CATH AND CORONARY ANGIOGRAPHY N/A 09/10/2017   Procedure: RIGHT/LEFT HEART CATH AND CORONARY ANGIOGRAPHY;  Surgeon: Troy Sine, MD;  Location: [facility];  Service: Cardiovascular;  Laterality: N/A;   THYROID SURGERY      Social History   Tobacco Use  Smoking Status Former   Types: Cigarettes   Quit date: 12/14/2005   Years since quitting: 17.0  Smokeless Tobacco Never  Tobacco Comments   QUIT IN 2005    Social History   Substance and Sexual Activity  Alcohol Use No    Social History   Socioeconomic History   Marital status: Widowed    Spouse name: Not on file  Number of children: 1   Years of education: Not on file   Highest education level: 11th grade  Occupational History   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, he passed"  Tobacco Use   Smoking status: Former    Types: Cigarettes    Quit date: 12/14/2005    Years since quitting: 17.0   Smokeless tobacco: Never   Tobacco comments:    QUIT IN 2005  Vaping Use   Vaping Use: Never used  Substance and Sexual Activity   Alcohol use: No   Drug use: No   Sexual activity: Never  Other Topics Concern   Not on file  Social History Narrative   Not on file   Social Determinants of Health   Financial Resource Strain: Low Risk  (12/22/2022)   Overall Financial Resource Strain (CARDIA)    Difficulty of Paying Living Expenses: Not very hard  Food Insecurity: No Food Insecurity (01/01/2020)   Hunger Vital Sign    Worried  About Running Out of Food in the Last Year: Never true    [place] in the Last Year: Never true  Transportation Needs: No Transportation Needs (12/22/2022)   PRAPARE - Hydrologist (Medical): No    Lack of Transportation (Non-Medical): No  Physical Activity: Not on file  Stress: Not on file  Social Connections: Not on file  Intimate Partner Violence: Not on file    Allergies  Allergen Reactions   Ampicillin Other (See Comments)    "Allergic," per [facility]   Aspirin Nausea Only   Hydrocodone Other (See Comments)    "Allergic," per [facility]   Unasyn [Ampicillin-Sulbactam Sodium] Rash and Other (See Comments)    "Allergic," per [facility]    Current Facility-Administered Medications  Medication Dose Route Frequency Provider Last Rate Last Admin   0.9 %  sodium chloride infusion  250 mL Intravenous PRN Manuella Ghazi, Pratik D, DO       0.9 %  sodium chloride infusion   Intravenous Continuous [name], MD 10 mL/hr at 12/22/22 1313 New Bag at 12/22/22 1313   acetaminophen (TYLENOL) tablet 650 mg  650 mg Oral Q4H PRN Manuella Ghazi, Pratik D, DO       albuterol (PROVENTIL) (2.5 MG/3ML) 0.083% nebulizer solution 3 mL  3 mL Inhalation Q6H PRN Manuella Ghazi, Pratik D, DO       atorvastatin (LIPITOR) tablet 80 mg  80 mg Oral QPM Shah, Pratik D, DO   80 mg at 12/21/22 1702   brimonidine (ALPHAGAN) 0.2 % ophthalmic solution 1 drop  1 drop Both Eyes BID Manuella Ghazi, Pratik D, DO   1 drop at 12/22/22 6283   And   timolol (TIMOPTIC) 0.5 % ophthalmic solution 1 drop  1 drop Both Eyes BID Manuella Ghazi, Pratik D, DO   1 drop at 12/22/22 1517   calcium carbonate (TUMS - dosed in mg elemental calcium) chewable tablet 800 mg of elemental calcium  4 tablet Oral QPM Manuella Ghazi, Pratik D, DO   800 mg of elemental calcium at 12/21/22 1702   clopidogrel (PLAVIX) tablet 75 mg  75 mg Oral Daily Manuella Ghazi, Pratik D, DO   75 mg at 12/22/22 0816   diclofenac Sodium (VOLTAREN) 1 % topical gel 2 g  2 g Topical Daily PRN Heath Lark D, DO        heparin injection 5,000 Units  5,000 Units Subcutaneous Q8H Shah, Pratik D, DO   5,000 Units at 12/22/22 1302   insulin aspart (novoLOG)  injection 0-15 Units  0-15 Units Subcutaneous TID WC Manuella Ghazi, Pratik D, DO   2 Units at 12/21/22 1702   insulin aspart (novoLOG) injection 0-5 Units  0-5 Units Subcutaneous QHS Manuella Ghazi, Pratik D, DO       insulin aspart (novoLOG) injection 10 Units  10 Units Subcutaneous TID WC Manuella Ghazi, Pratik D, DO   10 Units at 12/21/22 1703   insulin glargine-yfgn (SEMGLEE) injection 16 Units  16 Units Subcutaneous QHS PRN Manuella Ghazi, Pratik D, DO       levothyroxine (SYNTHROID) tablet 200 mcg  200 mcg Oral QAC breakfast Manuella Ghazi, Pratik D, DO   200 mcg at 12/22/22 1245   levothyroxine (SYNTHROID) tablet 50 mcg  50 mcg Oral QAC breakfast Heath Lark D, DO   50 mcg at 12/22/22 8099   loratadine (CLARITIN) tablet 10 mg  10 mg Oral Daily Manuella Ghazi, Pratik D, DO   10 mg at 12/22/22 0816   ondansetron (ZOFRAN) injection 4 mg  4 mg Intravenous Q6H PRN Manuella Ghazi, Pratik D, DO       senna-docusate (Senokot-S) tablet 2 tablet  2 tablet Oral BID Manuella Ghazi, Pratik D, DO   2 tablet at 12/20/22 2200   sodium chloride flush (NS) 0.9 % injection 3 mL  3 mL Intravenous Q12H Shah, Pratik D, DO   3 mL at 12/22/22 8338   sodium chloride flush (NS) 0.9 % injection 3 mL  3 mL Intravenous PRN Heath Lark D, DO         Family History  Problem Relation Age of Onset   Diabetes Mother    Hypertension Mother    Cancer Mother        pancreas   Hypertension Sister        Physical Exam: BP (!) 141/106   Pulse 73   Temp (!) 97.2 F (36.2 C) (Axillary)   Resp (!) 26   Ht '5\' 6"'$  (1.676 m)   Wt (!) 159.6 kg   SpO2 93%   BMI 56.79 kg/m  Lungs: very decreased Card: rr Chest: very large breasts and no scars in area of xyphoid    Diagnostic Studies & Laboratory data: I have personally reviewed the following studies and agree with the findings     Recent Radiology Findings:   CARDIAC CATHETERIZATION  Result Date:  12/22/2022 1.  Unsuccessful pericardiocentesis and pericardial drain placement due to very protuberant abdomen; echocardiography on site could not identify an apical area for needle placement. Recommendation: Consider pericardial window.   ECHOCARDIOGRAM LIMITED  Result Date: 12/22/2022    ECHOCARDIOGRAM LIMITED REPORT   Patient Name:   Maureen Jackson Date of Exam: 12/22/2022 Medical Rec #:  [id number]       Height:       66.0 in Accession #:    [id number]      Weight:       351.9 lb Date of Birth:  03/09/1956        BSA:          2.542 m Patient Age:    13 years        BP:           128/79 mmHg Patient Gender: F               HR:           66 bpm. Exam Location:  Inpatient Procedure: Limited Echo Indications:    Pericardial effusion I31.3  History:        Patient has prior  history of Echocardiogram examinations, most                 recent 12/20/2022. CHF, CAD, Arrythmias:LBBB; Risk                 Factors:Hypertension and Sleep Apnea.  Sonographer:    Darlina Sicilian RDCS Referring Phys: [id number] [place]  1. Large pericardial effusion up to 4.8 cm anterior to RV (subcostal) and 2.8 cm posterior to LV. The RV appears small and underfilled. There is systolic collapse of the RA. The IVC is dilated. Overall, findings are suspicious for cardiac tamponade, but  clinical correlation is recommended. Large pericardial effusion. The pericardial effusion is posterior and lateral to the left ventricle and anterior to the right ventricle.  2. Left ventricular ejection fraction, by estimation, is 30 to 35%. The left ventricle has moderately decreased function. Left ventricular endocardial border not optimally defined to evaluate regional wall motion.  3. Right ventricular systolic function is normal. The right ventricular size is normal.  4. The inferior vena cava is dilated in size with >50% respiratory variability, suggesting right atrial pressure of 8 mmHg. FINDINGS  Left Ventricle: Left ventricular  ejection fraction, by estimation, is 30 to 35%. The left ventricle has moderately decreased function. Left ventricular endocardial border not optimally defined to evaluate regional wall motion. Right Ventricle: The right ventricular size is normal. Right ventricular systolic function is normal. Pericardium: Large pericardial effusion up to 4.8 cm anterior to RV (subcostal) and 2.8 cm posterior to LV. The RV appears small and underfilled. There is systolic collapse of the RA. The IVC is dilated. Overall, findings are suspicious for cardiac tamponade, but clinical correlation is recommended. A large pericardial effusion is present. The pericardial effusion is posterior and lateral to the left ventricle and anterior to the right ventricle. Venous: The inferior vena cava is dilated in size with greater than 50% respiratory variability, suggesting right atrial pressure of 8 mmHg. Eleonore Chiquito MD Electronically signed by Eleonore Chiquito MD Signature Date/Time: 12/22/2022/11:07:58 AM    Final       Recent Lab Findings: Lab Results  Component Value Date   WBC 6.9 12/22/2022   HGB 10.3 (L) 12/22/2022   HCT 33.8 (L) 12/22/2022   PLT 182 12/22/2022   GLUCOSE 78 12/22/2022   CHOL 147 07/17/2018   TRIG 124 12/26/2019   HDL 31 (L) 07/17/2018   LDLCALC 83 07/17/2018   ALT 22 12/08/2022   AST 26 12/08/2022   NA 142 12/22/2022   K 4.1 12/22/2022   CL 107 12/22/2022   CREATININE 2.14 (H) 12/22/2022   BUN 28 (H) 12/22/2022   CO2 25 12/22/2022   TSH 130.159 (H) 12/20/2022   INR 1.0 12/08/2022   HGBA1C 7.2 (H) 12/20/2022      Assessment / Plan:     Pericardial effusion Unknown etiology suspect possibly CHF Will proceed with subxyphoid window and all the risks and goals explained to pt and sister. They understand that her habitus makes this more complex and difficult and also higher risk of bleeding secondary to plavix. Currently not hemodynamically compromised   I have spent 35 min in review of the  records, viewing studies and in face to face with patient and in coordination of future care    Coralie Common 12/22/2022 5:24 PM

## 2022-12-22 NOTE — Progress Notes (Signed)
Rounding Note    Patient Name: Maureen Jackson Date of Encounter: 12/22/2022  [facility] Cardiologist: Carlyle Dolly, MD   Subjective   No CP or dyspnea  Inpatient Medications    Scheduled Meds:  atorvastatin  80 mg Oral QPM   brimonidine  1 drop Both Eyes BID   And   timolol  1 drop Both Eyes BID   calcium carbonate  4 tablet Oral QPM   carvedilol  12.5 mg Oral BID WC   clopidogrel  75 mg Oral Daily   heparin  5,000 Units Subcutaneous Q8H   insulin aspart  0-15 Units Subcutaneous TID WC   insulin aspart  0-5 Units Subcutaneous QHS   insulin aspart  10 Units Subcutaneous TID WC   levothyroxine  200 mcg Oral QAC breakfast   levothyroxine  50 mcg Oral QAC breakfast   loratadine  10 mg Oral Daily   senna-docusate  2 tablet Oral BID   sodium chloride flush  3 mL Intravenous Q12H   Continuous Infusions:  sodium chloride     PRN Meds: sodium chloride, acetaminophen, albuterol, diclofenac Sodium, insulin glargine-yfgn, ondansetron (ZOFRAN) IV, sodium chloride flush   Vital Signs    Vitals:   12/22/22 0515 12/22/22 0733 12/22/22 0816 12/22/22 1000  BP: 113/67 (!) 127/104  128/79  Pulse: 66 65 66 68  Resp: '15 18  12  '$ Temp: 98 F (36.7 C) 98.1 F (36.7 C)  98.7 F (37.1 C)  TempSrc: Oral Oral  Oral  SpO2: 93% 96%  97%  Weight:      Height:        Intake/Output Summary (Last 24 hours) at 12/22/2022 1031 Last data filed at 12/21/2022 1601 Gross per 24 hour  Intake 240 ml  Output --  Net 240 ml      12/21/2022    9:30 PM 12/21/2022    5:26 AM 12/20/2022    2:44 PM  Last 3 Weights  Weight (lbs) 351 lb 13.7 oz 356 lb 0.7 oz 352 lb 4.7 oz  Weight (kg) 159.6 kg 161.5 kg 159.8 kg      Telemetry    Sinus - Personally Reviewed   Physical Exam   GEN: No acute distress.   Neck: No JVD Cardiac: RRR, no murmurs, rubs, or gallops.  Respiratory: Clear to auscultation bilaterally. GI: Soft, nontender, non-distended  MS: No edema Neuro:  Nonfocal   Psych: Normal affect   Labs    High Sensitivity Troponin:   Recent Labs  Lab 12/08/22 1110 12/08/22 1255 12/20/22 0953 12/20/22 1142  TROPONINIHS '10 11 12 10     '$ Chemistry Recent Labs  Lab 12/20/22 0953 12/21/22 0435 12/22/22 0121  NA 142 145 142  K 3.9 3.8 4.1  CL 107 109 107  CO2 '25 29 25  '$ GLUCOSE 151* 126* 78  BUN 21 24* 28*  CREATININE 1.47* 1.64* 2.14*  CALCIUM 8.5* 8.3* 8.0*  MG  --  2.1 2.1  GFRNONAA 40* 35* 25*  ANIONGAP '10 7 10   '$ Hematology Recent Labs  Lab 12/20/22 0953 12/21/22 0435 12/22/22 0121  WBC 5.9 5.5 6.9  RBC 3.99 3.66* 3.67*  HGB 11.1* 10.2* 10.3*  HCT 36.5 34.1* 33.8*  MCV 91.5 93.2 92.1  MCH 27.8 27.9 28.1  MCHC 30.4 29.9* 30.5  RDW 15.4 15.6* 15.4  PLT 204 179 182   Thyroid  Recent Labs  Lab 12/20/22 0956  TSH 130.159*    BNP Recent Labs  Lab 12/20/22 0953  BNP 74.0     Radiology    ECHOCARDIOGRAM COMPLETE  Result Date: 12/20/2022    ECHOCARDIOGRAM REPORT   Patient Name:   Maureen Jackson Date of Exam: 12/20/2022 Medical Rec #:  [id number]       Height:       66.0 in Accession #:    [id number]      Weight:       335.0 lb Date of Birth:  May 20, 1956        BSA:          2.490 m Patient Age:    67 years        BP:           126/74 mmHg Patient Gender: F               HR:           72 bpm. Exam Location:  [facility] Procedure: 2D Echo, Cardiac Doppler, Color Doppler and Intracardiac            Opacification Agent Indications:    Dyspnea  History:        Patient has prior history of Echocardiogram examinations, most                 recent 03/21/2022. CHF, CAD, TIA, Signs/Symptoms:Chest Pain and                 Edema; Risk Factors:Diabetes, Hypertension, Dyslipidemia and                 Former Smoker.  Sonographer:    Wenda Low Referring Phys: [phone] JULIE HAVILAND  Sonographer Comments: No subcostal window, suboptimal apical window, suboptimal parasternal window, Technically difficult study due to poor echo windows and patient is  obese. IMPRESSIONS  1. Poor acoustic windows for evaluation of pericardial effusion. There is a large pericardial effusion lateral to the left ventricle and moderate pericardial effusion anterior to the right ventricle although large effusion cannot be excluded due to poor  windows. No subcostal window. Suboptimal apical window with possible right atrial diastolic collapse but right ventricle is not well visualized.  2. Left ventricular ejection fraction, by estimation, is 30 to 35%. The left ventricle has moderately decreased function. Assessment of regional wall motion is suboptimal despite use of definity. There is akinesis of anteroseptal wall and apex. There is  severe left ventricular hypertrophy. Left ventricular diastolic parameters are indeterminate.  3. Right ventricular systolic function was not well visualized. The right ventricular size is not well visualized.  4. The mitral valve was not well visualized. No evidence of mitral valve regurgitation. No evidence of mitral stenosis.  5. The aortic valve is tricuspid. Aortic valve regurgitation is not visualized. No aortic stenosis is present. Comparison(s): No significant change from prior study. FINDINGS  Left Ventricle: Left ventricular ejection fraction, by estimation, is 30 to 35%. The left ventricle has moderately decreased function. The left ventricle demonstrates regional wall motion abnormalities. Definity contrast agent was given IV to delineate the left ventricular endocardial borders. The left ventricular internal cavity size was normal in size. There is severe left ventricular hypertrophy. Left ventricular diastolic parameters are indeterminate.  LV Wall Scoring: The anterior septum and apex are akinetic. Right Ventricle: The right ventricular size is not well visualized. Right vetricular wall thickness was not well visualized. Right ventricular systolic function was not well visualized. Left Atrium: Left atrial size was normal in size. Right  Atrium: Right atrial size was normal in size.  Pericardium: Poor acoustic windows for evaluation of pericardial effusion. There is a large pericardial effusion lateral to the left ventricle and moderate pericardial effusion anterior to the right ventricle although large effusion cannot be excluded due to poor windows. No subcostal window. Suboptimal apical window with possible right atrial diastolic collapse but right ventricle is not well visualized. Mitral Valve: The mitral valve was not well visualized. No evidence of mitral valve regurgitation. No evidence of mitral valve stenosis. MV peak gradient, 2.4 mmHg. The mean mitral valve gradient is 1.0 mmHg. Tricuspid Valve: The tricuspid valve is not well visualized. Tricuspid valve regurgitation is not demonstrated. No evidence of tricuspid stenosis. Aortic Valve: The aortic valve is tricuspid. Aortic valve regurgitation is not visualized. No aortic stenosis is present. Aortic valve mean gradient measures 2.0 mmHg. Aortic valve peak gradient measures 4.0 mmHg. Aortic valve area, by VTI measures 2.30 cm. Pulmonic Valve: The pulmonic valve was not well visualized. Pulmonic valve regurgitation is not visualized. No evidence of pulmonic stenosis. Aorta: The aortic root is normal in size and structure. Venous: The inferior vena cava was not well visualized. IAS/Shunts: The interatrial septum was not well visualized.  LEFT VENTRICLE PLAX 2D LVIDd:         5.50 cm LVIDs:         4.20 cm LV PW:         1.50 cm LV IVS:        1.60 cm LVOT diam:     2.00 cm LV SV:         45 LV SV Index:   18 LVOT Area:     3.14 cm  LEFT ATRIUM         Index LA diam:    4.00 cm 1.61 cm/m  AORTIC VALVE AV Area (Vmax):    2.43 cm AV Area (Vmean):   2.03 cm AV Area (VTI):     2.30 cm AV Vmax:           99.60 cm/s AV Vmean:          68.500 cm/s AV VTI:            0.197 m AV Peak Grad:      4.0 mmHg AV Mean Grad:      2.0 mmHg LVOT Vmax:         77.00 cm/s LVOT Vmean:        44.300 cm/s LVOT  VTI:          0.144 m LVOT/AV VTI ratio: 0.73  AORTA Ao Root diam: 3.30 cm MITRAL VALVE MV Area (PHT): 4.15 cm    SHUNTS MV Area VTI:   2.34 cm    Systemic VTI:  0.14 m MV Peak grad:  2.4 mmHg    Systemic Diam: 2.00 cm MV Mean grad:  1.0 mmHg MV Vmax:       0.78 m/s MV Vmean:      49.2 cm/s MV Decel Time: 183 msec MV E velocity: 79.30 cm/s MV A velocity: 66.80 cm/s MV E/A ratio:  1.19 Vishnu Priya Mallipeddi Electronically signed by Lorelee Cover Mallipeddi Signature Date/Time: 12/20/2022/4:40:34 PM    Final    CT Angio Chest PE W and/or Wo Contrast  Result Date: 12/20/2022 CLINICAL DATA:  Worsening shortness of breath for 2 weeks. EXAM: CT ANGIOGRAPHY CHEST WITH CONTRAST TECHNIQUE: Multidetector CT imaging of the chest was performed using the standard protocol during bolus administration of intravenous contrast. Multiplanar CT image reconstructions and MIPs were obtained to  evaluate the vascular anatomy. RADIATION DOSE REDUCTION: This exam was performed according to the departmental dose-optimization program which includes automated exposure control, adjustment of the mA and/or kV according to patient size and/or use of iterative reconstruction technique. CONTRAST:  51m OMNIPAQUE IOHEXOL 350 MG/ML SOLN COMPARISON:  Chest x-ray 12/20/2022 and older FINDINGS: Cardiovascular: There is a large pericardial effusion. Coronary artery calcifications are seen. The heart itself is nonenlarged. The thoracic aorta is of normal course and caliber with some mild atherosclerotic changes. There is some reflux of contrast into the intrahepatic IVC. With significant breathing motion evaluation of small and peripheral emboli are limited. There is some enlargement of some pulmonary arterial branches. Please correlate for pulmonary arterial hypertension. No large or central embolus. Mediastinum/Nodes: Surgical changes in the thyroid bed. No specific abnormal lymph node enlargement seen in the axillary region, hilum or mediastinum.  Small hiatal hernia. Otherwise normal caliber thoracic esophagus which is slightly patulous. Lungs/Pleura: Breathing motion seen throughout the examination. There is some mild scattered areas of ground-glass in both lungs. There is some enlargement of the pulmonary arteries. No pneumothorax. No consolidation. Trace bilateral pleural fluid. Upper Abdomen: Fatty liver infiltration seen in the upper abdomen. Adrenal glands are preserved. There is a replaced left hepatic artery from the left gastric. Congenital variant. Musculoskeletal: Scattered degenerative changes along the spine with bridging osteophytes and syndesmophytes. Review of the MIP images confirms the above findings. IMPRESSION: Significant breathing motion limits evaluation of the pulmonary arterial tree. No large or central embolus. Large pericardial effusion. There is some reflux of contrast into the intrahepatic IVC. Please correlate for normal cardiac motion. Coronary artery calcifications. Please correlate for other coronary risk factors. Trace bilateral pleural fluid. Aortic Atherosclerosis (ICD10-I70.0). Electronically Signed   By: AJill SideM.D.   On: 12/20/2022 12:35   DG Chest Port 1 View  Result Date: 12/20/2022 CLINICAL DATA:  Short of breath. EXAM: PORTABLE CHEST 1 VIEW COMPARISON:  12/08/2022 and older exams. FINDINGS: Marked enlargement of the cardiopericardial silhouette, unchanged. No mediastinal or hilar masses. Clear lungs.  No convincing pleural effusion or pneumothorax. Skeletal structures are grossly intact. Stable changes from prior thyroid surgery. IMPRESSION: 1. No acute cardiopulmonary disease. 2. Marked cardiomegaly, stable from the prior study. Electronically Signed   By: DLajean ManesM.D.   On: 12/20/2022 10:50     Patient Profile     67y.o. female with past medical history of coronary artery disease, ischemic cardiomyopathy, pericardial effusion, diabetes mellitus, peripheral vascular disease, obstructive sleep  apnea, hypertension transferred from endocrine for management of pericardial effusion.  Patient was admitted with dyspnea and given Lasix with subsequent hypotension.  She is felt to potentially be in tamponade [facility].  Echocardiogram performed January 10 showed ejection fraction 30 to 35% and large pericardial effusion more prominent in the posterior lateral distribution.  Chest CT showed no pulmonary embolus but there was note of a large pericardial effusion.  Assessment & Plan    1 pericardial effusion-patient was admitted with worsening dyspnea over the past 2 weeks and lower extremity edema.  She was given Lasix and by report became hypotensive.  Her echocardiogram from January 10 has been personally reviewed.  She has a large pericardial effusion with a significant amount in the posterior lateral distribution.  She was transferred for pericardial window.  Will review with cardiothoracic surgery and interventional cardiology (pericardiocentesis versus window).  2 coronary artery disease-will continue statin.  3 ischemic cardiomyopathy-patient's  blood pressure is borderline and renal function worse.  Will hold Entresto, spironolactone and carvedilol.  Also holding Lasix.  Resume following pericardiocentesis as tolerated by blood pressure.  4 acute on chronic stage IIIa kidney disease-renal function is worse possibly secondary to hypotension/poor perfusion.  Hold medications as outlined above.  Hopefully this will improve following pericardiocentesis.  For questions or updates, please contact [place] Please consult [email or web address] for contact info under        Signed, Kirk Ruths, MD  12/22/2022, 10:31 AM

## 2022-12-22 NOTE — Progress Notes (Signed)
Patient states that she does not wear CPAP  at home.   RT will continue to monitor.

## 2022-12-22 NOTE — Progress Notes (Signed)
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  She was admitted with acute systolic CHF exacerbation with LVEF 30-35% and noted pericardial effusion that is concerning for possible tamponade.  She was seen by cardiology and started on IV diuresis.  Diuresis and antihypertensives held on 1/11 due to softer blood pressure readings.  Patient seen by cardiology with recommendations to transfer to [facility] for cardiothoracic surgery evaluation for pericardial window, but cardiothoracic surgery advised bradycardia and symptoms his first, plan is to have cardiothoracic surgery and cardiology to discuss for next plan of action 1/12      Subjective: Seen and examined this morning.  Had soft BP and chest pain overnight. Overnight afebrile BP 110 transiently 85/68 in the midnight send standing 110s to 120s Labs reviewed creatinine slightly up 2.1 hb up at 10.3  Assessment and Plan: Principal Problem:   Acute on chronic combined systolic and diastolic CHF (congestive heart failure) (HCC) Active Problems:   Hypothyroidism   Essential hypertension, benign   Morbid obesity/BMI > 55   DM type 2 causing vascular disease (HCC)   Pericardial effusion   Coronary artery disease involving native coronary artery of native heart without angina pectoris   Ischemic cardiomyopathy   Cardiac tamponade  Acute on chronic systolic congestive heart failure Pericardial effusion with dyspnea worsening past 2 weeks: Cardiology currently involved along with CT surgery given large pericardial effusion, plan is to repeat echocardiogram and possible pericardiocentesis.  Keep n.p.o. for now Lasix  on hold due to soft BP continue per cardiology. Baseline wt 330 pound.  Monitor intake output  Ischemic cardiomyopathy/CAD-DES to LAD and RCA 2018/PAD Hyperretension Hyperlipidemia: Continue Lipitor, Plavix.  BP has been borderline holding Entresto Aldactone Coreg and diuretics  AKI on stage IIIa CKD: Slightly uptrending creatinine holding diuretics hopefully improved after pericardiocentesis Recent Labs    02/06/22 1837 02/22/22 0956 08/26/22 2027 12/08/22 1110 12/20/22 0953 12/21/22 0435 12/22/22 0121  BUN '19 20 20 '$ 32* 21 24* 28*  CREATININE 1.16* 1.22* 1.17* 1.71* 1.47* 1.64* 2.14*   Severe hypothyroidism TSH 130 continue levothyroxine to 50 mcg followed by endocrinology Dr. Dorris Fetch, remote history of thyroidectomy for benign goiter  Type 2 diabetes mellitus A1c 7.3 on Tresiba 50 units at nighttime NovoLog 10 units 3 times daily and SSI holding Ozempic monitor and adjust Recent Labs  Lab 12/20/22 0953 12/20/22 1739 12/21/22 1606 12/21/22 2135 12/22/22 0623 12/22/22 1025 12/22/22 1138  GLUCAP  --    < > 125* 79 87 115* 104*  HGBA1C 7.2*  --   --   --   --   --   --    < > = values in this interval not displayed.    Morbid Obesity:Patient's Body mass index is 56.79 kg/m. : Will benefit with PCP follow-up, weight loss  healthy lifestyle OSA patient noncompliant with CPAP.  DVT prophylaxis: heparin injection 5,000 Units Start: 12/20/22 1400 Code Status:   Code Status: Full Code Family Communication: plan of care discussed with patient at bedside. Patient status is: inpatient because of pericardial efffusion  Level of care: Telemetry Medical  Dispo: The patient is from: Home            Anticipated disposition: TBD  Objective: Vitals last 24 hrs: Vitals:   12/22/22 0733 12/22/22 0816 12/22/22 1000 12/22/22 1141  BP: (!) 127/104  128/79 110/73  Pulse: 65 66 68 66  Resp: '18  12 17  '$ Temp: 98.1 F (36.7 C)  98.7 F (37.1 C) 98.2 F (36.8 C)  TempSrc: Oral  Oral Oral   SpO2: 96%  97% 97%  Weight:      Height:       Weight change: 7.645 kg  Physical Examination: General exam: alert awake, older than stated age HEENT:Oral mucosa moist, Ear/Nose WNL grossly Respiratory system: bilaterally CLEAR BS, no use of accessory muscle Cardiovascular system: S1 & S2 +, No JVD. Gastrointestinal system: Abdomen soft,NT,ND, BS+ Nervous System:Alert, awake, moving extremities. Extremities: LE edema NEG,distal peripheral pulses palpable.  Skin: No rashes,no icterus. MSK: Normal muscle bulk,tone, power  Medications reviewed:  Scheduled Meds:  atorvastatin  80 mg Oral QPM   brimonidine  1 drop Both Eyes BID   And   timolol  1 drop Both Eyes BID   calcium carbonate  4 tablet Oral QPM   clopidogrel  75 mg Oral Daily   heparin  5,000 Units Subcutaneous Q8H   insulin aspart  0-15 Units Subcutaneous TID WC   insulin aspart  0-5 Units Subcutaneous QHS   insulin aspart  10 Units Subcutaneous TID WC   levothyroxine  200 mcg Oral QAC breakfast   levothyroxine  50 mcg Oral QAC breakfast   loratadine  10 mg Oral Daily   senna-docusate  2 tablet Oral BID   sodium chloride flush  3 mL Intravenous Q12H   Continuous Infusions:  sodium chloride      Pressure Injury 09/20/19 Buttocks Left Stage II -  Partial thickness loss of dermis presenting as a shallow open ulcer with a red, pink wound bed without slough. (Active)  09/20/19 1302  Location: Buttocks  Location Orientation: Left  Staging: Stage II -  Partial thickness loss of dermis presenting as a shallow open ulcer with a red, pink wound bed without slough.  Wound Description (Comments):   Present on Admission:    Diet Order             Diet NPO time specified Except for: Sips with Meds  Diet effective now                            Intake/Output Summary (Last 24 hours) at 12/22/2022 1156 Last data filed at 12/21/2022 1601 Gross per 24 hour  Intake 240 ml  Output --  Net 240 ml   Net IO Since  Admission: -317 mL [12/22/22 1156]  Wt Readings from Last 3 Encounters:  12/21/22 (!) 159.6 kg  12/08/22 (!) 161.5 kg  07/26/22 (!) 161.5 kg     Unresulted Labs (From admission, onward)     Start     Ordered   12/21/22 9735  Basic metabolic panel  Daily,   R     Comments: As Scheduled for 5 days    12/20/22 1338          Data Reviewed: I have personally reviewed following labs and imaging studies CBC: Recent Labs  Lab 12/20/22 0953 12/21/22 0435 12/22/22 0121  WBC 5.9 5.5 6.9  NEUTROABS 3.2  --   --   HGB 11.1* 10.2* 10.3*  HCT 36.5 34.1* 33.8*  MCV 91.5 93.2 92.1  PLT 204 179 329   Basic Metabolic Panel: Recent Labs  Lab  12/20/22 0953 12/21/22 0435 12/22/22 0121  NA 142 145 142  K 3.9 3.8 4.1  CL 107 109 107  CO2 '25 29 25  '$ GLUCOSE 151* 126* 78  BUN 21 24* 28*  CREATININE 1.47* 1.64* 2.14*  CALCIUM 8.5* 8.3* 8.0*  MG  --  2.1 2.1   Recent Labs  Lab 12/21/22 1606 12/21/22 2135 12/22/22 0623 12/22/22 1025 12/22/22 1138  GLUCAP 125* 79 87 115* 104*   Lipid Profile: No results for input(s): "CHOL", "HDL", "LDLCALC", "TRIG", "CHOLHDL", "LDLDIRECT" in the last 72 hours. Thyroid Function Tests: Recent Labs    12/20/22 0956  TSH 130.159*   Sepsis Labs: No results for input(s): "PROCALCITON", "LATICACIDVEN" in the last 168 hours.  Recent Results (from the past 240 hour(s))  Resp panel by RT-PCR (RSV, Flu A&B, Covid) Anterior Nasal Swab     Status: None   Collection Time: 12/20/22 10:11 AM   Specimen: Anterior Nasal Swab  Result Value Ref Range Status   SARS Coronavirus 2 by RT PCR NEGATIVE NEGATIVE Final    Comment: (NOTE) SARS-CoV-2 target nucleic acids are NOT DETECTED.  The SARS-CoV-2 RNA is generally detectable in upper respiratory specimens during the acute phase of infection. The lowest concentration of SARS-CoV-2 viral copies this assay can detect is 138 copies/mL. A negative result does not preclude SARS-Cov-2 infection and should not  be used as the sole basis for treatment or other patient management decisions. A negative result may occur with  improper specimen collection/handling, submission of specimen other than nasopharyngeal swab, presence of viral mutation(s) within the areas targeted by this assay, and inadequate number of viral copies(<138 copies/mL). A negative result must be combined with clinical observations, patient history, and epidemiological information. The expected result is Negative.  Fact Sheet for Patients:  EntrepreneurPulse.com.au  Fact Sheet for Healthcare Providers:  IncredibleEmployment.be  This test is no t yet approved or cleared by the [place] FDA and  has been authorized for detection and/or diagnosis of SARS-CoV-2 by FDA under an Emergency Use Authorization (EUA). This EUA will remain  in effect (meaning this test can be used) for the duration of the COVID-19 declaration under Section 564(b)(1) of the Act, 21 U.S.C.section 360bbb-3(b)(1), unless the authorization is terminated  or revoked sooner.       Influenza A by PCR NEGATIVE NEGATIVE Final   Influenza B by PCR NEGATIVE NEGATIVE Final    Comment: (NOTE) The Xpert Xpress SARS-CoV-2/FLU/RSV plus assay is intended as an aid in the diagnosis of influenza from Nasopharyngeal swab specimens and should not be used as a sole basis for treatment. Nasal washings and aspirates are unacceptable for Xpert Xpress SARS-CoV-2/FLU/RSV testing.  Fact Sheet for Patients: EntrepreneurPulse.com.au  Fact Sheet for Healthcare Providers: IncredibleEmployment.be  This test is not yet approved or cleared by the [place] FDA and has been authorized for detection and/or diagnosis of SARS-CoV-2 by FDA under an Emergency Use Authorization (EUA). This EUA will remain in effect (meaning this test can be used) for the duration of the COVID-19 declaration under Section  564(b)(1) of the Act, 21 U.S.C. section 360bbb-3(b)(1), unless the authorization is terminated or revoked.     Resp Syncytial Virus by PCR NEGATIVE NEGATIVE Final    Comment: (NOTE) Fact Sheet for Patients: EntrepreneurPulse.com.au  Fact Sheet for Healthcare Providers: IncredibleEmployment.be  This test is not yet approved or cleared by the [place] FDA and has been authorized for detection and/or diagnosis of SARS-CoV-2 by FDA under an Emergency Use Authorization (EUA).  This EUA will remain in effect (meaning this test can be used) for the duration of the COVID-19 declaration under Section 564(b)(1) of the Act, 21 U.S.C. section 360bbb-3(b)(1), unless the authorization is terminated or revoked.  Performed at [facility], [address] [place], [place] [address]     Antimicrobials: Anti-infectives (From admission, onward)    None      Culture/Microbiology    Component Value Date/Time   SDES LEFT ANTECUBITAL 12/26/2019 1713   SPECREQUEST  12/26/2019 1713    BOTTLES DRAWN AEROBIC AND ANAEROBIC Blood Culture adequate volume   CULT  12/26/2019 1713    NO GROWTH 5 DAYS Performed at [facility], [address], [place], [place] [address]    REPTSTATUS 12/31/2019 FINAL 12/26/2019 1713   Other culture-see note  Radiology Studies: ECHOCARDIOGRAM LIMITED  Result Date: 12/22/2022    ECHOCARDIOGRAM LIMITED REPORT   Patient Name:   Maureen Jackson Date of Exam: 12/22/2022 Medical Rec #:  [id number]       Height:       66.0 in Accession #:    [id number]      Weight:       351.9 lb Date of Birth:  1956/08/10        BSA:          2.542 m Patient Age:    57 years        BP:           128/79 mmHg Patient Gender: F               HR:           66 bpm. Exam Location:  Inpatient Procedure: Limited Echo Indications:    Pericardial effusion I31.3  History:        Patient has prior history of Echocardiogram examinations, most                 recent  12/20/2022. CHF, CAD, Arrythmias:LBBB; Risk                 Factors:Hypertension and Sleep Apnea.  Sonographer:    Darlina Sicilian RDCS Referring Phys: [id number] [place]  1. Large pericardial effusion up to 4.8 cm anterior to RV (subcostal) and 2.8 cm posterior to LV. The RV appears small and underfilled. There is systolic collapse of the RA. The IVC is dilated. Overall, findings are suspicious for cardiac tamponade, but  clinical correlation is recommended. Large pericardial effusion. The pericardial effusion is posterior and lateral to the left ventricle and anterior to the right ventricle.  2. Left ventricular ejection fraction, by estimation, is 30 to 35%. The left ventricle has moderately decreased function. Left ventricular endocardial border not optimally defined to evaluate regional wall motion.  3. Right ventricular systolic function is normal. The right ventricular size is normal.  4. The inferior vena cava is dilated in size with >50% respiratory variability, suggesting right atrial pressure of 8 mmHg. FINDINGS  Left Ventricle: Left ventricular ejection fraction, by estimation, is 30 to 35%. The left ventricle has moderately decreased function. Left ventricular endocardial border not optimally defined to evaluate regional wall motion. Right Ventricle: The right ventricular size is normal. Right ventricular systolic function is normal. Pericardium: Large pericardial effusion up to 4.8 cm anterior to RV (subcostal) and 2.8 cm posterior to LV. The RV appears small and underfilled. There is systolic collapse of the RA. The IVC is dilated. Overall, findings are suspicious for cardiac tamponade, but clinical correlation  is recommended. A large pericardial effusion is present. The pericardial effusion is posterior and lateral to the left ventricle and anterior to the right ventricle. Venous: The inferior vena cava is dilated in size with greater than 50% respiratory variability, suggesting right  atrial pressure of 8 mmHg. Eleonore Chiquito MD Electronically signed by Eleonore Chiquito MD Signature Date/Time: 12/22/2022/11:07:58 AM    Final    ECHOCARDIOGRAM COMPLETE  Result Date: 12/20/2022    ECHOCARDIOGRAM REPORT   Patient Name:   Maureen Jackson Date of Exam: 12/20/2022 Medical Rec #:  [id number]       Height:       66.0 in Accession #:    [id number]      Weight:       335.0 lb Date of Birth:  1955/12/24        BSA:          2.490 m Patient Age:    3 years        BP:           126/74 mmHg Patient Gender: F               HR:           72 bpm. Exam Location:  [facility] Procedure: 2D Echo, Cardiac Doppler, Color Doppler and Intracardiac            Opacification Agent Indications:    Dyspnea  History:        Patient has prior history of Echocardiogram examinations, most                 recent 03/21/2022. CHF, CAD, TIA, Signs/Symptoms:Chest Pain and                 Edema; Risk Factors:Diabetes, Hypertension, Dyslipidemia and                 Former Smoker.  Sonographer:    Wenda Low Referring Phys: [phone] JULIE HAVILAND  Sonographer Comments: No subcostal window, suboptimal apical window, suboptimal parasternal window, Technically difficult study due to poor echo windows and patient is obese. IMPRESSIONS  1. Poor acoustic windows for evaluation of pericardial effusion. There is a large pericardial effusion lateral to the left ventricle and moderate pericardial effusion anterior to the right ventricle although large effusion cannot be excluded due to poor  windows. No subcostal window. Suboptimal apical window with possible right atrial diastolic collapse but right ventricle is not well visualized.  2. Left ventricular ejection fraction, by estimation, is 30 to 35%. The left ventricle has moderately decreased function. Assessment of regional wall motion is suboptimal despite use of definity. There is akinesis of anteroseptal wall and apex. There is  severe left ventricular hypertrophy. Left ventricular  diastolic parameters are indeterminate.  3. Right ventricular systolic function was not well visualized. The right ventricular size is not well visualized.  4. The mitral valve was not well visualized. No evidence of mitral valve regurgitation. No evidence of mitral stenosis.  5. The aortic valve is tricuspid. Aortic valve regurgitation is not visualized. No aortic stenosis is present. Comparison(s): No significant change from prior study. FINDINGS  Left Ventricle: Left ventricular ejection fraction, by estimation, is 30 to 35%. The left ventricle has moderately decreased function. The left ventricle demonstrates regional wall motion abnormalities. Definity contrast agent was given IV to delineate the left ventricular endocardial borders. The left ventricular internal cavity size was normal in size. There is severe left ventricular hypertrophy. Left ventricular diastolic parameters  are indeterminate.  LV Wall Scoring: The anterior septum and apex are akinetic. Right Ventricle: The right ventricular size is not well visualized. Right vetricular wall thickness was not well visualized. Right ventricular systolic function was not well visualized. Left Atrium: Left atrial size was normal in size. Right Atrium: Right atrial size was normal in size. Pericardium: Poor acoustic windows for evaluation of pericardial effusion. There is a large pericardial effusion lateral to the left ventricle and moderate pericardial effusion anterior to the right ventricle although large effusion cannot be excluded due to poor windows. No subcostal window. Suboptimal apical window with possible right atrial diastolic collapse but right ventricle is not well visualized. Mitral Valve: The mitral valve was not well visualized. No evidence of mitral valve regurgitation. No evidence of mitral valve stenosis. MV peak gradient, 2.4 mmHg. The mean mitral valve gradient is 1.0 mmHg. Tricuspid Valve: The tricuspid valve is not well visualized.  Tricuspid valve regurgitation is not demonstrated. No evidence of tricuspid stenosis. Aortic Valve: The aortic valve is tricuspid. Aortic valve regurgitation is not visualized. No aortic stenosis is present. Aortic valve mean gradient measures 2.0 mmHg. Aortic valve peak gradient measures 4.0 mmHg. Aortic valve area, by VTI measures 2.30 cm. Pulmonic Valve: The pulmonic valve was not well visualized. Pulmonic valve regurgitation is not visualized. No evidence of pulmonic stenosis. Aorta: The aortic root is normal in size and structure. Venous: The inferior vena cava was not well visualized. IAS/Shunts: The interatrial septum was not well visualized.  LEFT VENTRICLE PLAX 2D LVIDd:         5.50 cm LVIDs:         4.20 cm LV PW:         1.50 cm LV IVS:        1.60 cm LVOT diam:     2.00 cm LV SV:         45 LV SV Index:   18 LVOT Area:     3.14 cm  LEFT ATRIUM         Index LA diam:    4.00 cm 1.61 cm/m  AORTIC VALVE AV Area (Vmax):    2.43 cm AV Area (Vmean):   2.03 cm AV Area (VTI):     2.30 cm AV Vmax:           99.60 cm/s AV Vmean:          68.500 cm/s AV VTI:            0.197 m AV Peak Grad:      4.0 mmHg AV Mean Grad:      2.0 mmHg LVOT Vmax:         77.00 cm/s LVOT Vmean:        44.300 cm/s LVOT VTI:          0.144 m LVOT/AV VTI ratio: 0.73  AORTA Ao Root diam: 3.30 cm MITRAL VALVE MV Area (PHT): 4.15 cm    SHUNTS MV Area VTI:   2.34 cm    Systemic VTI:  0.14 m MV Peak grad:  2.4 mmHg    Systemic Diam: 2.00 cm MV Mean grad:  1.0 mmHg MV Vmax:       0.78 m/s MV Vmean:      49.2 cm/s MV Decel Time: 183 msec MV E velocity: 79.30 cm/s MV A velocity: 66.80 cm/s MV E/A ratio:  1.19 Vishnu Priya Mallipeddi Electronically signed by Lorelee Cover Mallipeddi Signature Date/Time: 12/20/2022/4:40:34 PM    Final  CT Angio Chest PE W and/or Wo Contrast  Result Date: 12/20/2022 CLINICAL DATA:  Worsening shortness of breath for 2 weeks. EXAM: CT ANGIOGRAPHY CHEST WITH CONTRAST TECHNIQUE: Multidetector CT imaging of  the chest was performed using the standard protocol during bolus administration of intravenous contrast. Multiplanar CT image reconstructions and MIPs were obtained to evaluate the vascular anatomy. RADIATION DOSE REDUCTION: This exam was performed according to the departmental dose-optimization program which includes automated exposure control, adjustment of the mA and/or kV according to patient size and/or use of iterative reconstruction technique. CONTRAST:  45m OMNIPAQUE IOHEXOL 350 MG/ML SOLN COMPARISON:  Chest x-ray 12/20/2022 and older FINDINGS: Cardiovascular: There is a large pericardial effusion. Coronary artery calcifications are seen. The heart itself is nonenlarged. The thoracic aorta is of normal course and caliber with some mild atherosclerotic changes. There is some reflux of contrast into the intrahepatic IVC. With significant breathing motion evaluation of small and peripheral emboli are limited. There is some enlargement of some pulmonary arterial branches. Please correlate for pulmonary arterial hypertension. No large or central embolus. Mediastinum/Nodes: Surgical changes in the thyroid bed. No specific abnormal lymph node enlargement seen in the axillary region, hilum or mediastinum. Small hiatal hernia. Otherwise normal caliber thoracic esophagus which is slightly patulous. Lungs/Pleura: Breathing motion seen throughout the examination. There is some mild scattered areas of ground-glass in both lungs. There is some enlargement of the pulmonary arteries. No pneumothorax. No consolidation. Trace bilateral pleural fluid. Upper Abdomen: Fatty liver infiltration seen in the upper abdomen. Adrenal glands are preserved. There is a replaced left hepatic artery from the left gastric. Congenital variant. Musculoskeletal: Scattered degenerative changes along the spine with bridging osteophytes and syndesmophytes. Review of the MIP images confirms the above findings. IMPRESSION: Significant breathing  motion limits evaluation of the pulmonary arterial tree. No large or central embolus. Large pericardial effusion. There is some reflux of contrast into the intrahepatic IVC. Please correlate for normal cardiac motion. Coronary artery calcifications. Please correlate for other coronary risk factors. Trace bilateral pleural fluid. Aortic Atherosclerosis (ICD10-I70.0). Electronically Signed   By: AJill SideM.D.   On: 12/20/2022 12:35     LOS: 2 days   RAntonieta Pert MD [facility]  12/22/2022, 11:56 AM

## 2022-12-22 NOTE — Significant Event (Addendum)
Rapid Response Event Note   Reason for Call :  Pt with c/o left chest pressure  Initial Focused Assessment:  Pt lying in bed, AO. She endorses left sided chest pressure. Breathing is regular, unlabored. Lung sounds are clear, diminished. Heart sounds are distant. Skin is pink, warm, dry. Abdomen is round, soft. Bowel sounds are faint. Pt endorses last BM was 01/09. She states it is normal for her not to have daily BMs.   VS: T 98.20F, BP 128/79, HR 68, RR 12, SpO2 97% on 2LNC CBG: 115  Interventions:  -EKG -CBG: 115  Plan of Care:  -Continue to monitor -ECHO ordered and at bedside -Follow-up with attending MD  Call rapid response for additional needs Event Summary:  MD Notified: Dr. Lupita Leash Call Time: 1975 Arrival Time: 8832 End Time: [place], RN

## 2022-12-22 NOTE — H&P (View-Only) (Signed)
[address]       [place] [address]             [phone]           Maureen Jackson [facility] Medical Record #[id number] Date of Birth: 1956-10-03  No ref. provider found Neale Burly, MD  Chief Complaint:    Chief Complaint  Patient presents with   Shortness of Breath    History of Present Illness:     67 yo with CAD sp PCI and ischemic cardiomyopathy, DM , obesity who was admitted with SOB and on CT of chest was noted to have large pericardial effusion. Pt had this confirmed by echo again with EF 35%. Pt on plavix and so was attempted to have percardiocentesis by IC today however secondary to pt's habitus was unable to gain access. 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 multivessel CAD by cath in 09/2017 and not felt to be a CABG candidate --> underwent two-vessel PCI with DES to the LAD and DES to the RCA   Diabetes mellitus    Diabetes mellitus, type II ([place])    Dyspnea    Glaucoma    Hypertension    Left bundle branch block    Morbid obesity ([place])    Obstructive sleep apnea    does not wear CPAP   Thyroid disease     Past Surgical History:  Procedure Laterality Date   ABDOMINAL HYSTERECTOMY     CARDIAC CATHETERIZATION  09/12/2017   CORONARY STENT INTERVENTION  09/12/2017   STENT RESOLUTE ONYX 8.14G81 drug eluting stent was successfully placed   CORONARY STENT INTERVENTION N/A 09/12/2017   Procedure: CORONARY STENT INTERVENTION;  Surgeon: Leonie Man, MD;  Location: [facility];  Service: Cardiovascular;  Laterality: N/A;   INCISION AND DRAINAGE PERIRECTAL ABSCESS Left 09/16/2019   Procedure: IRRIGATION AND DEBRIDEMENT LABIA ABSCESS;  Surgeon: Coralie Keens, MD;  Location: [facility];  Service:  General;  Laterality: Left;   IR FLUORO GUIDE CV LINE RIGHT  09/16/2019   IR US GUIDE VASC ACCESS RIGHT  09/16/2019   LEFT HEART CATH AND CORONARY ANGIOGRAPHY N/A 09/12/2017   Procedure: LEFT HEART CATH AND CORONARY ANGIOGRAPHY;  Surgeon: Leonie Man, MD;  Location: [facility];  Service: Cardiovascular;  Laterality: N/A;   MASS EXCISION N/A 12/18/2018   Procedure: EXCISION TONGUE MASS;  Surgeon: Leta Baptist, MD;  Location: [facility];  Service: ENT;  Laterality: N/A;   RIGHT/LEFT HEART CATH AND CORONARY ANGIOGRAPHY N/A 09/10/2017   Procedure: RIGHT/LEFT HEART CATH AND CORONARY ANGIOGRAPHY;  Surgeon: Troy Sine, MD;  Location: [facility];  Service: Cardiovascular;  Laterality: N/A;   THYROID SURGERY      Social History   Tobacco Use  Smoking Status Former   Types: Cigarettes   Quit date: 12/14/2005   Years since quitting: 17.0  Smokeless Tobacco Never  Tobacco Comments   QUIT IN 2005    Social History   Substance and Sexual Activity  Alcohol Use No    Social History   Socioeconomic History   Marital status: Widowed    Spouse name: Not on file  Number of children: 1   Years of education: Not on file   Highest education level: 11th grade  Occupational History   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, he passed"  Tobacco Use   Smoking status: Former    Types: Cigarettes    Quit date: 12/14/2005    Years since quitting: 17.0   Smokeless tobacco: Never   Tobacco comments:    QUIT IN 2005  Vaping Use   Vaping Use: Never used  Substance and Sexual Activity   Alcohol use: No   Drug use: No   Sexual activity: Never  Other Topics Concern   Not on file  Social History Narrative   Not on file   Social Determinants of Health   Financial Resource Strain: Low Risk  (12/22/2022)   Overall Financial Resource Strain (CARDIA)    Difficulty of Paying Living Expenses: Not very hard  Food Insecurity: No Food Insecurity (01/01/2020)   Hunger Vital Sign    Worried  About Running Out of Food in the Last Year: Never true    [place] in the Last Year: Never true  Transportation Needs: No Transportation Needs (12/22/2022)   PRAPARE - Hydrologist (Medical): No    Lack of Transportation (Non-Medical): No  Physical Activity: Not on file  Stress: Not on file  Social Connections: Not on file  Intimate Partner Violence: Not on file    Allergies  Allergen Reactions   Ampicillin Other (See Comments)    "Allergic," per [facility]   Aspirin Nausea Only   Hydrocodone Other (See Comments)    "Allergic," per [facility]   Unasyn [Ampicillin-Sulbactam Sodium] Rash and Other (See Comments)    "Allergic," per [facility]    Current Facility-Administered Medications  Medication Dose Route Frequency Provider Last Rate Last Admin   0.9 %  sodium chloride infusion  250 mL Intravenous PRN Manuella Ghazi, Pratik D, DO       0.9 %  sodium chloride infusion   Intravenous Continuous [name], MD 10 mL/hr at 12/22/22 1313 New Bag at 12/22/22 1313   acetaminophen (TYLENOL) tablet 650 mg  650 mg Oral Q4H PRN Manuella Ghazi, Pratik D, DO       albuterol (PROVENTIL) (2.5 MG/3ML) 0.083% nebulizer solution 3 mL  3 mL Inhalation Q6H PRN Manuella Ghazi, Pratik D, DO       atorvastatin (LIPITOR) tablet 80 mg  80 mg Oral QPM Shah, Pratik D, DO   80 mg at 12/21/22 1702   brimonidine (ALPHAGAN) 0.2 % ophthalmic solution 1 drop  1 drop Both Eyes BID Manuella Ghazi, Pratik D, DO   1 drop at 12/22/22 4259   And   timolol (TIMOPTIC) 0.5 % ophthalmic solution 1 drop  1 drop Both Eyes BID Manuella Ghazi, Pratik D, DO   1 drop at 12/22/22 5638   calcium carbonate (TUMS - dosed in mg elemental calcium) chewable tablet 800 mg of elemental calcium  4 tablet Oral QPM Manuella Ghazi, Pratik D, DO   800 mg of elemental calcium at 12/21/22 1702   clopidogrel (PLAVIX) tablet 75 mg  75 mg Oral Daily Manuella Ghazi, Pratik D, DO   75 mg at 12/22/22 0816   diclofenac Sodium (VOLTAREN) 1 % topical gel 2 g  2 g Topical Daily PRN Heath Lark D, DO        heparin injection 5,000 Units  5,000 Units Subcutaneous Q8H Shah, Pratik D, DO   5,000 Units at 12/22/22 1302   insulin aspart (novoLOG)  injection 0-15 Units  0-15 Units Subcutaneous TID WC Manuella Ghazi, Pratik D, DO   2 Units at 12/21/22 1702   insulin aspart (novoLOG) injection 0-5 Units  0-5 Units Subcutaneous QHS Manuella Ghazi, Pratik D, DO       insulin aspart (novoLOG) injection 10 Units  10 Units Subcutaneous TID WC Manuella Ghazi, Pratik D, DO   10 Units at 12/21/22 1703   insulin glargine-yfgn (SEMGLEE) injection 16 Units  16 Units Subcutaneous QHS PRN Manuella Ghazi, Pratik D, DO       levothyroxine (SYNTHROID) tablet 200 mcg  200 mcg Oral QAC breakfast Manuella Ghazi, Pratik D, DO   200 mcg at 12/22/22 8841   levothyroxine (SYNTHROID) tablet 50 mcg  50 mcg Oral QAC breakfast Heath Lark D, DO   50 mcg at 12/22/22 6606   loratadine (CLARITIN) tablet 10 mg  10 mg Oral Daily Manuella Ghazi, Pratik D, DO   10 mg at 12/22/22 0816   ondansetron (ZOFRAN) injection 4 mg  4 mg Intravenous Q6H PRN Manuella Ghazi, Pratik D, DO       senna-docusate (Senokot-S) tablet 2 tablet  2 tablet Oral BID Manuella Ghazi, Pratik D, DO   2 tablet at 12/20/22 2200   sodium chloride flush (NS) 0.9 % injection 3 mL  3 mL Intravenous Q12H Shah, Pratik D, DO   3 mL at 12/22/22 3016   sodium chloride flush (NS) 0.9 % injection 3 mL  3 mL Intravenous PRN Heath Lark D, DO         Family History  Problem Relation Age of Onset   Diabetes Mother    Hypertension Mother    Cancer Mother        pancreas   Hypertension Sister        Physical Exam: BP (!) 141/106   Pulse 73   Temp (!) 97.2 F (36.2 C) (Axillary)   Resp (!) 26   Ht '5\' 6"'$  (1.676 m)   Wt (!) 159.6 kg   SpO2 93%   BMI 56.79 kg/m  Lungs: very decreased Card: rr Chest: very large breasts and no scars in area of xyphoid    Diagnostic Studies & Laboratory data: I have personally reviewed the following studies and agree with the findings     Recent Radiology Findings:   CARDIAC CATHETERIZATION  Result Date:  12/22/2022 1.  Unsuccessful pericardiocentesis and pericardial drain placement due to very protuberant abdomen; echocardiography on site could not identify an apical area for needle placement. Recommendation: Consider pericardial window.   ECHOCARDIOGRAM LIMITED  Result Date: 12/22/2022    ECHOCARDIOGRAM LIMITED REPORT   Patient Name:   Maureen Jackson Date of Exam: 12/22/2022 Medical Rec #:  [id number]       Height:       66.0 in Accession #:    [id number]      Weight:       351.9 lb Date of Birth:  1955/12/14        BSA:          2.542 m Patient Age:    64 years        BP:           128/79 mmHg Patient Gender: F               HR:           66 bpm. Exam Location:  Inpatient Procedure: Limited Echo Indications:    Pericardial effusion I31.3  History:        Patient has prior  history of Echocardiogram examinations, most                 recent 12/20/2022. CHF, CAD, Arrythmias:LBBB; Risk                 Factors:Hypertension and Sleep Apnea.  Sonographer:    Darlina Sicilian RDCS Referring Phys: [id number] [place]  1. Large pericardial effusion up to 4.8 cm anterior to RV (subcostal) and 2.8 cm posterior to LV. The RV appears small and underfilled. There is systolic collapse of the RA. The IVC is dilated. Overall, findings are suspicious for cardiac tamponade, but  clinical correlation is recommended. Large pericardial effusion. The pericardial effusion is posterior and lateral to the left ventricle and anterior to the right ventricle.  2. Left ventricular ejection fraction, by estimation, is 30 to 35%. The left ventricle has moderately decreased function. Left ventricular endocardial border not optimally defined to evaluate regional wall motion.  3. Right ventricular systolic function is normal. The right ventricular size is normal.  4. The inferior vena cava is dilated in size with >50% respiratory variability, suggesting right atrial pressure of 8 mmHg. FINDINGS  Left Ventricle: Left ventricular  ejection fraction, by estimation, is 30 to 35%. The left ventricle has moderately decreased function. Left ventricular endocardial border not optimally defined to evaluate regional wall motion. Right Ventricle: The right ventricular size is normal. Right ventricular systolic function is normal. Pericardium: Large pericardial effusion up to 4.8 cm anterior to RV (subcostal) and 2.8 cm posterior to LV. The RV appears small and underfilled. There is systolic collapse of the RA. The IVC is dilated. Overall, findings are suspicious for cardiac tamponade, but clinical correlation is recommended. A large pericardial effusion is present. The pericardial effusion is posterior and lateral to the left ventricle and anterior to the right ventricle. Venous: The inferior vena cava is dilated in size with greater than 50% respiratory variability, suggesting right atrial pressure of 8 mmHg. Eleonore Chiquito MD Electronically signed by Eleonore Chiquito MD Signature Date/Time: 12/22/2022/11:07:58 AM    Final       Recent Lab Findings: Lab Results  Component Value Date   WBC 6.9 12/22/2022   HGB 10.3 (L) 12/22/2022   HCT 33.8 (L) 12/22/2022   PLT 182 12/22/2022   GLUCOSE 78 12/22/2022   CHOL 147 07/17/2018   TRIG 124 12/26/2019   HDL 31 (L) 07/17/2018   LDLCALC 83 07/17/2018   ALT 22 12/08/2022   AST 26 12/08/2022   NA 142 12/22/2022   K 4.1 12/22/2022   CL 107 12/22/2022   CREATININE 2.14 (H) 12/22/2022   BUN 28 (H) 12/22/2022   CO2 25 12/22/2022   TSH 130.159 (H) 12/20/2022   INR 1.0 12/08/2022   HGBA1C 7.2 (H) 12/20/2022      Assessment / Plan:     Pericardial effusion Unknown etiology suspect possibly CHF Will proceed with subxyphoid window and all the risks and goals explained to pt and sister. They understand that her habitus makes this more complex and difficult and also higher risk of bleeding secondary to plavix. Currently not hemodynamically compromised   I have spent 35 min in review of the  records, viewing studies and in face to face with patient and in coordination of future care    Coralie Common 12/22/2022 5:24 PM

## 2022-12-22 NOTE — H&P (View-Only) (Signed)
Rounding Note    Patient Name: Maureen Jackson Date of Encounter: 12/22/2022  [facility] Cardiologist: Carlyle Dolly, MD   Subjective   No CP or dyspnea  Inpatient Medications    Scheduled Meds:  atorvastatin  80 mg Oral QPM   brimonidine  1 drop Both Eyes BID   And   timolol  1 drop Both Eyes BID   calcium carbonate  4 tablet Oral QPM   carvedilol  12.5 mg Oral BID WC   clopidogrel  75 mg Oral Daily   heparin  5,000 Units Subcutaneous Q8H   insulin aspart  0-15 Units Subcutaneous TID WC   insulin aspart  0-5 Units Subcutaneous QHS   insulin aspart  10 Units Subcutaneous TID WC   levothyroxine  200 mcg Oral QAC breakfast   levothyroxine  50 mcg Oral QAC breakfast   loratadine  10 mg Oral Daily   senna-docusate  2 tablet Oral BID   sodium chloride flush  3 mL Intravenous Q12H   Continuous Infusions:  sodium chloride     PRN Meds: sodium chloride, acetaminophen, albuterol, diclofenac Sodium, insulin glargine-yfgn, ondansetron (ZOFRAN) IV, sodium chloride flush   Vital Signs    Vitals:   12/22/22 0515 12/22/22 0733 12/22/22 0816 12/22/22 1000  BP: 113/67 (!) 127/104  128/79  Pulse: 66 65 66 68  Resp: '15 18  12  '$ Temp: 98 F (36.7 C) 98.1 F (36.7 C)  98.7 F (37.1 C)  TempSrc: Oral Oral  Oral  SpO2: 93% 96%  97%  Weight:      Height:        Intake/Output Summary (Last 24 hours) at 12/22/2022 1031 Last data filed at 12/21/2022 1601 Gross per 24 hour  Intake 240 ml  Output --  Net 240 ml      12/21/2022    9:30 PM 12/21/2022    5:26 AM 12/20/2022    2:44 PM  Last 3 Weights  Weight (lbs) 351 lb 13.7 oz 356 lb 0.7 oz 352 lb 4.7 oz  Weight (kg) 159.6 kg 161.5 kg 159.8 kg      Telemetry    Sinus - Personally Reviewed   Physical Exam   GEN: No acute distress.   Neck: No JVD Cardiac: RRR, no murmurs, rubs, or gallops.  Respiratory: Clear to auscultation bilaterally. GI: Soft, nontender, non-distended  MS: No edema Neuro:  Nonfocal   Psych: Normal affect   Labs    High Sensitivity Troponin:   Recent Labs  Lab 12/08/22 1110 12/08/22 1255 12/20/22 0953 12/20/22 1142  TROPONINIHS '10 11 12 10     '$ Chemistry Recent Labs  Lab 12/20/22 0953 12/21/22 0435 12/22/22 0121  NA 142 145 142  K 3.9 3.8 4.1  CL 107 109 107  CO2 '25 29 25  '$ GLUCOSE 151* 126* 78  BUN 21 24* 28*  CREATININE 1.47* 1.64* 2.14*  CALCIUM 8.5* 8.3* 8.0*  MG  --  2.1 2.1  GFRNONAA 40* 35* 25*  ANIONGAP '10 7 10   '$ Hematology Recent Labs  Lab 12/20/22 0953 12/21/22 0435 12/22/22 0121  WBC 5.9 5.5 6.9  RBC 3.99 3.66* 3.67*  HGB 11.1* 10.2* 10.3*  HCT 36.5 34.1* 33.8*  MCV 91.5 93.2 92.1  MCH 27.8 27.9 28.1  MCHC 30.4 29.9* 30.5  RDW 15.4 15.6* 15.4  PLT 204 179 182   Thyroid  Recent Labs  Lab 12/20/22 0956  TSH 130.159*    BNP Recent Labs  Lab 12/20/22 0953  BNP 74.0     Radiology    ECHOCARDIOGRAM COMPLETE  Result Date: 12/20/2022    ECHOCARDIOGRAM REPORT   Patient Name:   Maureen Jackson Date of Exam: 12/20/2022 Medical Rec #:  [id number]       Height:       66.0 in Accession #:    [id number]      Weight:       335.0 lb Date of Birth:  March 09, 1956        BSA:          2.490 m Patient Age:    67 years        BP:           126/74 mmHg Patient Gender: F               HR:           72 bpm. Exam Location:  [facility] Procedure: 2D Echo, Cardiac Doppler, Color Doppler and Intracardiac            Opacification Agent Indications:    Dyspnea  History:        Patient has prior history of Echocardiogram examinations, most                 recent 03/21/2022. CHF, CAD, TIA, Signs/Symptoms:Chest Pain and                 Edema; Risk Factors:Diabetes, Hypertension, Dyslipidemia and                 Former Smoker.  Sonographer:    Wenda Low Referring Phys: [phone] JULIE HAVILAND  Sonographer Comments: No subcostal window, suboptimal apical window, suboptimal parasternal window, Technically difficult study due to poor echo windows and patient is  obese. IMPRESSIONS  1. Poor acoustic windows for evaluation of pericardial effusion. There is a large pericardial effusion lateral to the left ventricle and moderate pericardial effusion anterior to the right ventricle although large effusion cannot be excluded due to poor  windows. No subcostal window. Suboptimal apical window with possible right atrial diastolic collapse but right ventricle is not well visualized.  2. Left ventricular ejection fraction, by estimation, is 30 to 35%. The left ventricle has moderately decreased function. Assessment of regional wall motion is suboptimal despite use of definity. There is akinesis of anteroseptal wall and apex. There is  severe left ventricular hypertrophy. Left ventricular diastolic parameters are indeterminate.  3. Right ventricular systolic function was not well visualized. The right ventricular size is not well visualized.  4. The mitral valve was not well visualized. No evidence of mitral valve regurgitation. No evidence of mitral stenosis.  5. The aortic valve is tricuspid. Aortic valve regurgitation is not visualized. No aortic stenosis is present. Comparison(s): No significant change from prior study. FINDINGS  Left Ventricle: Left ventricular ejection fraction, by estimation, is 30 to 35%. The left ventricle has moderately decreased function. The left ventricle demonstrates regional wall motion abnormalities. Definity contrast agent was given IV to delineate the left ventricular endocardial borders. The left ventricular internal cavity size was normal in size. There is severe left ventricular hypertrophy. Left ventricular diastolic parameters are indeterminate.  LV Wall Scoring: The anterior septum and apex are akinetic. Right Ventricle: The right ventricular size is not well visualized. Right vetricular wall thickness was not well visualized. Right ventricular systolic function was not well visualized. Left Atrium: Left atrial size was normal in size. Right  Atrium: Right atrial size was normal in size.  Pericardium: Poor acoustic windows for evaluation of pericardial effusion. There is a large pericardial effusion lateral to the left ventricle and moderate pericardial effusion anterior to the right ventricle although large effusion cannot be excluded due to poor windows. No subcostal window. Suboptimal apical window with possible right atrial diastolic collapse but right ventricle is not well visualized. Mitral Valve: The mitral valve was not well visualized. No evidence of mitral valve regurgitation. No evidence of mitral valve stenosis. MV peak gradient, 2.4 mmHg. The mean mitral valve gradient is 1.0 mmHg. Tricuspid Valve: The tricuspid valve is not well visualized. Tricuspid valve regurgitation is not demonstrated. No evidence of tricuspid stenosis. Aortic Valve: The aortic valve is tricuspid. Aortic valve regurgitation is not visualized. No aortic stenosis is present. Aortic valve mean gradient measures 2.0 mmHg. Aortic valve peak gradient measures 4.0 mmHg. Aortic valve area, by VTI measures 2.30 cm. Pulmonic Valve: The pulmonic valve was not well visualized. Pulmonic valve regurgitation is not visualized. No evidence of pulmonic stenosis. Aorta: The aortic root is normal in size and structure. Venous: The inferior vena cava was not well visualized. IAS/Shunts: The interatrial septum was not well visualized.  LEFT VENTRICLE PLAX 2D LVIDd:         5.50 cm LVIDs:         4.20 cm LV PW:         1.50 cm LV IVS:        1.60 cm LVOT diam:     2.00 cm LV SV:         45 LV SV Index:   18 LVOT Area:     3.14 cm  LEFT ATRIUM         Index LA diam:    4.00 cm 1.61 cm/m  AORTIC VALVE AV Area (Vmax):    2.43 cm AV Area (Vmean):   2.03 cm AV Area (VTI):     2.30 cm AV Vmax:           99.60 cm/s AV Vmean:          68.500 cm/s AV VTI:            0.197 m AV Peak Grad:      4.0 mmHg AV Mean Grad:      2.0 mmHg LVOT Vmax:         77.00 cm/s LVOT Vmean:        44.300 cm/s LVOT  VTI:          0.144 m LVOT/AV VTI ratio: 0.73  AORTA Ao Root diam: 3.30 cm MITRAL VALVE MV Area (PHT): 4.15 cm    SHUNTS MV Area VTI:   2.34 cm    Systemic VTI:  0.14 m MV Peak grad:  2.4 mmHg    Systemic Diam: 2.00 cm MV Mean grad:  1.0 mmHg MV Vmax:       0.78 m/s MV Vmean:      49.2 cm/s MV Decel Time: 183 msec MV E velocity: 79.30 cm/s MV A velocity: 66.80 cm/s MV E/A ratio:  1.19 Vishnu Priya Mallipeddi Electronically signed by Lorelee Cover Mallipeddi Signature Date/Time: 12/20/2022/4:40:34 PM    Final    CT Angio Chest PE W and/or Wo Contrast  Result Date: 12/20/2022 CLINICAL DATA:  Worsening shortness of breath for 2 weeks. EXAM: CT ANGIOGRAPHY CHEST WITH CONTRAST TECHNIQUE: Multidetector CT imaging of the chest was performed using the standard protocol during bolus administration of intravenous contrast. Multiplanar CT image reconstructions and MIPs were obtained to  evaluate the vascular anatomy. RADIATION DOSE REDUCTION: This exam was performed according to the departmental dose-optimization program which includes automated exposure control, adjustment of the mA and/or kV according to patient size and/or use of iterative reconstruction technique. CONTRAST:  4m OMNIPAQUE IOHEXOL 350 MG/ML SOLN COMPARISON:  Chest x-ray 12/20/2022 and older FINDINGS: Cardiovascular: There is a large pericardial effusion. Coronary artery calcifications are seen. The heart itself is nonenlarged. The thoracic aorta is of normal course and caliber with some mild atherosclerotic changes. There is some reflux of contrast into the intrahepatic IVC. With significant breathing motion evaluation of small and peripheral emboli are limited. There is some enlargement of some pulmonary arterial branches. Please correlate for pulmonary arterial hypertension. No large or central embolus. Mediastinum/Nodes: Surgical changes in the thyroid bed. No specific abnormal lymph node enlargement seen in the axillary region, hilum or mediastinum.  Small hiatal hernia. Otherwise normal caliber thoracic esophagus which is slightly patulous. Lungs/Pleura: Breathing motion seen throughout the examination. There is some mild scattered areas of ground-glass in both lungs. There is some enlargement of the pulmonary arteries. No pneumothorax. No consolidation. Trace bilateral pleural fluid. Upper Abdomen: Fatty liver infiltration seen in the upper abdomen. Adrenal glands are preserved. There is a replaced left hepatic artery from the left gastric. Congenital variant. Musculoskeletal: Scattered degenerative changes along the spine with bridging osteophytes and syndesmophytes. Review of the MIP images confirms the above findings. IMPRESSION: Significant breathing motion limits evaluation of the pulmonary arterial tree. No large or central embolus. Large pericardial effusion. There is some reflux of contrast into the intrahepatic IVC. Please correlate for normal cardiac motion. Coronary artery calcifications. Please correlate for other coronary risk factors. Trace bilateral pleural fluid. Aortic Atherosclerosis (ICD10-I70.0). Electronically Signed   By: AJill SideM.D.   On: 12/20/2022 12:35   DG Chest Port 1 View  Result Date: 12/20/2022 CLINICAL DATA:  Short of breath. EXAM: PORTABLE CHEST 1 VIEW COMPARISON:  12/08/2022 and older exams. FINDINGS: Marked enlargement of the cardiopericardial silhouette, unchanged. No mediastinal or hilar masses. Clear lungs.  No convincing pleural effusion or pneumothorax. Skeletal structures are grossly intact. Stable changes from prior thyroid surgery. IMPRESSION: 1. No acute cardiopulmonary disease. 2. Marked cardiomegaly, stable from the prior study. Electronically Signed   By: DLajean ManesM.D.   On: 12/20/2022 10:50     Patient Profile     67y.o. female with past medical history of coronary artery disease, ischemic cardiomyopathy, pericardial effusion, diabetes mellitus, peripheral vascular disease, obstructive sleep  apnea, hypertension transferred from endocrine for management of pericardial effusion.  Patient was admitted with dyspnea and given Lasix with subsequent hypotension.  She is felt to potentially be in tamponade [facility].  Echocardiogram performed January 10 showed ejection fraction 30 to 35% and large pericardial effusion more prominent in the posterior lateral distribution.  Chest CT showed no pulmonary embolus but there was note of a large pericardial effusion.  Assessment & Plan    1 pericardial effusion-patient was admitted with worsening dyspnea over the past 2 weeks and lower extremity edema.  She was given Lasix and by report became hypotensive.  Her echocardiogram from January 10 has been personally reviewed.  She has a large pericardial effusion with a significant amount in the posterior lateral distribution.  She was transferred for pericardial window.  Will review with cardiothoracic surgery and interventional cardiology (pericardiocentesis versus window).  2 coronary artery disease-will continue statin.  3 ischemic cardiomyopathy-patient's  blood pressure is borderline and renal function worse.  Will hold Entresto, spironolactone and carvedilol.  Also holding Lasix.  Resume following pericardiocentesis as tolerated by blood pressure.  4 acute on chronic stage IIIa kidney disease-renal function is worse possibly secondary to hypotension/poor perfusion.  Hold medications as outlined above.  Hopefully this will improve following pericardiocentesis.  For questions or updates, please contact [place] Please consult [email or web address] for contact info under        Signed, Kirk Ruths, MD  12/22/2022, 10:31 AM

## 2022-12-22 NOTE — Hospital Course (Addendum)
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  She was seen by cardiology and started on IV diuresis. Diuresis and antihypertensives held on 1/11 due to softer blood pressure readings.  On  1/12, cardiology attempted attempted pericardiocentesis but unsuccessful.  CT surgery took patient on 1/13 for subxiphoid pericardial window with drainage of effusion as well as pericardial biopsy and placement of drain.  By 1/15, chest tube discontinued. GDMT has been adjusted by cardiology with Aldactone, Jardiance.  Given elevated creatinine Lasix is on Hold, PT OT has recommended skilled nursing facility.  At this time patient medically stable discharge currently has signed off, will need close monitoring of renal function

## 2022-12-22 NOTE — Progress Notes (Signed)
   Heart Failure Stewardship Pharmacist Progress Note   PCP: Neale Burly, MD PCP-Cardiologist: Carlyle Dolly, MD    HPI:  67 yo F with PMH of CHF, CAD, HTN, glaucoma, hypothyroidism, OSA, T2DM, obesity, and arthritis.   Known ischemic cardiomyopathy since at least 2018. EF at that time was 30-35% and multivessel CAD on cath. Received 2 stents to circumflex. EF has remained in the 30-40% range since then.   She presented to the ED on 1/10 with shortness of breath and LE edema. Denies orthopnea. CXR with no acute cardiopulmonary disease and stable cardiomegaly. CTA showed large pericardial effusion and trace bilateral pleural fluid. ECHO 1/12 showed LVEF 30-35%, RV normal, findings suspicious for cardiac tamponade. Transferred to [facility] for repeat ECHO and possible pericardiocentesis.   Current HF Medications: None  Prior to admission HF Medications: Diuretic: furosemide 40 mg daily Beta blocker: carvedilol 12.5 mg BID ACE/ARB/ARNI: Entresto 97/103 mg BID *not taking Jardiance or spironolactone   Pertinent Lab Values: Serum creatinine 2.14, BUN 28, Potassium 4.1, Sodium 142, BNP 74, Magnesium 2.1, A1c 7.2   Vital Signs: Weight: 351 lbs (admission weight: 352 lbs) Blood pressure: 110-120/70s  Heart rate: 60-70s  I/O: -0.3L yesterday; net -0.7L  Medication Assistance / Insurance Benefits Check: Does the patient have prescription insurance?  Yes Type of insurance plan: [facility] Medicare  Outpatient Pharmacy:  Prior to admission outpatient pharmacy: [facility] Is the patient willing to use [facility] at discharge? Yes Is the patient willing to transition their outpatient pharmacy to utilize a [facility] outpatient pharmacy?   Pending    Assessment: 1. Acute on chronic systolic CHF (LVEF 17-49%), due to ICM. NYHA class III symptoms. - Holding GDMT with low BP and AKI - Will likely need to resume spironolactone and Jardiance on discharge   Plan: 1) Medication changes  recommended at this time: - None pending procedures today and improvement in renal function  2) Patient assistance: - None pending  3)  Education  - To be completed prior to discharge  Kerby Nora, PharmD, BCPS Heart Failure Stewardship Pharmacist Phone [phone]

## 2022-12-23 ENCOUNTER — Inpatient Hospital Stay (HOSPITAL_COMMUNITY): Payer: 59

## 2022-12-23 ENCOUNTER — Encounter (HOSPITAL_COMMUNITY): Payer: Self-pay | Admitting: Internal Medicine

## 2022-12-23 ENCOUNTER — Encounter (HOSPITAL_COMMUNITY)
Admission: EM | Disposition: A | Discharge status: Skilled Nursing Facility | Payer: Self-pay | Source: Home / Self Care | Attending: Internal Medicine

## 2022-12-23 ENCOUNTER — Inpatient Hospital Stay (HOSPITAL_COMMUNITY): Payer: 59 | Admitting: Certified Registered Nurse Anesthetist

## 2022-12-23 DIAGNOSIS — I2510 Atherosclerotic heart disease of native coronary artery without angina pectoris: Secondary | ICD-10-CM | POA: Diagnosis not present

## 2022-12-23 DIAGNOSIS — I110 Hypertensive heart disease with heart failure: Secondary | ICD-10-CM | POA: Diagnosis not present

## 2022-12-23 DIAGNOSIS — I509 Heart failure, unspecified: Secondary | ICD-10-CM | POA: Diagnosis not present

## 2022-12-23 DIAGNOSIS — Z87891 Personal history of nicotine dependence: Secondary | ICD-10-CM

## 2022-12-23 DIAGNOSIS — D638 Anemia in other chronic diseases classified elsewhere: Secondary | ICD-10-CM

## 2022-12-23 DIAGNOSIS — I3139 Other pericardial effusion (noninflammatory): Secondary | ICD-10-CM | POA: Diagnosis not present

## 2022-12-23 DIAGNOSIS — E039 Hypothyroidism, unspecified: Secondary | ICD-10-CM

## 2022-12-23 DIAGNOSIS — I5043 Acute on chronic combined systolic (congestive) and diastolic (congestive) heart failure: Secondary | ICD-10-CM | POA: Diagnosis not present

## 2022-12-23 HISTORY — PX: TEE WITHOUT CARDIOVERSION: SHX5443

## 2022-12-23 HISTORY — PX: SUBXYPHOID PERICARDIAL WINDOW: SHX5075

## 2022-12-23 LAB — GLUCOSE, CAPILLARY
Glucose-Capillary: 136 mg/dL — ABNORMAL HIGH (ref 70–99)
Glucose-Capillary: 137 mg/dL — ABNORMAL HIGH (ref 70–99)
Glucose-Capillary: 136 mg/dL — ABNORMAL HIGH (ref 70–99)
Glucose-Capillary: 198 mg/dL — ABNORMAL HIGH (ref 70–99)
Glucose-Capillary: 205 mg/dL — ABNORMAL HIGH (ref 70–99)

## 2022-12-23 LAB — SURGICAL PCR SCREEN
MRSA, PCR: NEGATIVE
Staphylococcus aureus: NEGATIVE

## 2022-12-23 LAB — BASIC METABOLIC PANEL
Anion gap: 8 (ref 5–15)
BUN: 30 mg/dL — ABNORMAL HIGH (ref 8–23)
CO2: 25 mmol/L (ref 22–32)
Calcium: 8.1 mg/dL — ABNORMAL LOW (ref 8.9–10.3)
Chloride: 105 mmol/L (ref 98–111)
Creatinine, Ser: 1.99 mg/dL — ABNORMAL HIGH (ref 0.44–1.00)
GFR, Estimated: 28 mL/min — ABNORMAL LOW (ref >60)
Glucose, Bld: 125 mg/dL — ABNORMAL HIGH (ref 70–99)
Potassium: 4.1 mmol/L (ref 3.5–5.1)
Sodium: 138 mmol/L (ref 135–145)

## 2022-12-23 LAB — ECHO INTRAOPERATIVE TEE
Height: 65.984 in
Weight: 5629.67 oz

## 2022-12-23 SURGERY — CREATION, PERICARDIAL WINDOW, SUBXIPHOID APPROACH
Anesthesia: General | Site: Chest

## 2022-12-23 MED ORDER — SUGAMMADEX SODIUM 200 MG/2ML IV SOLN
INTRAVENOUS | Status: DC | PRN
  Administered 2022-12-23: 700 mg via INTRAVENOUS

## 2022-12-23 MED ORDER — MIDAZOLAM HCL 2 MG/2ML IJ SOLN
INTRAMUSCULAR | Status: DC | PRN
  Administered 2022-12-23: 1 mg via INTRAVENOUS

## 2022-12-23 MED ORDER — KETAMINE HCL 50 MG/5ML IJ SOSY
PREFILLED_SYRINGE | INTRAMUSCULAR | Status: AC
  Filled 2022-12-23: qty 5

## 2022-12-23 MED ORDER — ONDANSETRON HCL 4 MG/2ML IJ SOLN
INTRAMUSCULAR | Status: DC | PRN
  Administered 2022-12-23: 4 mg via INTRAVENOUS

## 2022-12-23 MED ORDER — FENTANYL CITRATE (PF) 250 MCG/5ML IJ SOLN
INTRAMUSCULAR | Status: DC | PRN
  Administered 2022-12-23: 100 ug via INTRAVENOUS
  Administered 2022-12-23 (×4): 25 ug via INTRAVENOUS

## 2022-12-23 MED ORDER — LACTATED RINGERS IV SOLN: INTRAVENOUS | Status: DC

## 2022-12-23 MED ORDER — PHENYLEPHRINE HCL-NACL 20-0.9 MG/250ML-% IV SOLN
INTRAVENOUS | Status: DC | PRN
  Administered 2022-12-23: 50 ug/min via INTRAVENOUS

## 2022-12-23 MED ORDER — MORPHINE SULFATE (PF) 2 MG/ML IV SOLN
2.0000 mg | INTRAVENOUS | Status: DC | PRN
  Administered 2022-12-23: 2 mg via INTRAVENOUS
  Filled 2022-12-23 (×3): qty 1

## 2022-12-23 MED ORDER — PROPOFOL 10 MG/ML IV BOLUS
INTRAVENOUS | Status: AC
  Filled 2022-12-23: qty 20

## 2022-12-23 MED ORDER — CHLORHEXIDINE GLUCONATE 0.12 % MT SOLN: 15.0000 mL | Freq: Once | OROMUCOSAL | Status: DC

## 2022-12-23 MED ORDER — OXYCODONE HCL 5 MG PO TABS
5.0000 mg | ORAL_TABLET | ORAL | Status: DC | PRN
  Administered 2022-12-23: 10 mg via ORAL
  Administered 2022-12-23: 5 mg via ORAL
  Administered 2022-12-25 – 2022-12-27 (×3): 10 mg via ORAL
  Filled 2022-12-23 (×4): qty 2
  Filled 2022-12-23: qty 1
  Filled 2022-12-23 (×2): qty 2

## 2022-12-23 MED ORDER — LIDOCAINE 2% (20 MG/ML) 5 ML SYRINGE
INTRAMUSCULAR | Status: DC | PRN
  Administered 2022-12-23: 100 mg via INTRAVENOUS

## 2022-12-23 MED ORDER — MIDAZOLAM HCL 2 MG/2ML IJ SOLN
INTRAMUSCULAR | Status: AC
  Filled 2022-12-23: qty 2

## 2022-12-23 MED ORDER — 0.9 % SODIUM CHLORIDE (POUR BTL) OPTIME
TOPICAL | Status: DC | PRN
  Administered 2022-12-23: 2000 mL

## 2022-12-23 MED ORDER — LACTATED RINGERS IV SOLN: INTRAVENOUS | Status: DC | PRN

## 2022-12-23 MED ORDER — TRAMADOL HCL 50 MG PO TABS
50.0000 mg | ORAL_TABLET | Freq: Four times a day (QID) | ORAL | Status: DC | PRN
  Administered 2022-12-29 – 2023-01-01 (×3): 50 mg via ORAL
  Filled 2022-12-23 (×3): qty 1

## 2022-12-23 MED ORDER — ETOMIDATE 2 MG/ML IV SOLN
INTRAVENOUS | Status: DC | PRN
  Administered 2022-12-23: 18 mg via INTRAVENOUS

## 2022-12-23 MED ORDER — FENTANYL CITRATE (PF) 250 MCG/5ML IJ SOLN
INTRAMUSCULAR | Status: AC
  Filled 2022-12-23: qty 5

## 2022-12-23 MED ORDER — ROCURONIUM BROMIDE 10 MG/ML (PF) SYRINGE
PREFILLED_SYRINGE | INTRAVENOUS | Status: DC | PRN
  Administered 2022-12-23: 100 mg via INTRAVENOUS

## 2022-12-23 MED ORDER — DEXTROSE 5 % IV SOLN
INTRAVENOUS | Status: DC | PRN
  Administered 2022-12-23: 3 g via INTRAVENOUS

## 2022-12-23 MED ORDER — MORPHINE SULFATE (PF) 2 MG/ML IV SOLN: 2.0000 mg | INTRAVENOUS | Status: DC | PRN

## 2022-12-23 MED ORDER — PHENYLEPHRINE 80 MCG/ML (10ML) SYRINGE FOR IV PUSH (FOR BLOOD PRESSURE SUPPORT)
PREFILLED_SYRINGE | INTRAVENOUS | Status: DC | PRN
  Administered 2022-12-23: 120 ug via INTRAVENOUS
  Administered 2022-12-23: 80 ug via INTRAVENOUS

## 2022-12-23 MED ORDER — NOREPINEPHRINE BITARTRATE 1 MG/ML IV SOLN
INTRAVENOUS | Status: DC | PRN
  Administered 2022-12-23: 8 mL via INTRAVENOUS

## 2022-12-23 MED ORDER — KETAMINE HCL 10 MG/ML IJ SOLN
INTRAMUSCULAR | Status: DC | PRN
  Administered 2022-12-23: 30 mg via INTRAVENOUS

## 2022-12-23 MED ORDER — ORAL CARE MOUTH RINSE: 15.0000 mL | Freq: Once | OROMUCOSAL | Status: DC

## 2022-12-23 MED ORDER — DEXAMETHASONE SODIUM PHOSPHATE 10 MG/ML IJ SOLN
INTRAMUSCULAR | Status: DC | PRN
  Administered 2022-12-23: 10 mg via INTRAVENOUS

## 2022-12-23 MED ORDER — NOREPINEPHRINE 4 MG/250ML-% IV SOLN
INTRAVENOUS | Status: DC | PRN
  Administered 2022-12-23: 2 ug/min via INTRAVENOUS

## 2022-12-23 MED ORDER — CHLORHEXIDINE GLUCONATE CLOTH 2 % EX PADS
6.0000 | MEDICATED_PAD | Freq: Every day | CUTANEOUS | Status: DC
  Administered 2022-12-24 – 2023-01-02 (×10): 6 via TOPICAL

## 2022-12-23 SURGICAL SUPPLY — 46 items
ADH SKN CLS LQ APL DERMABOND (GAUZE/BANDAGES/DRESSINGS) ×2
BAG DECANTER FOR FLEXI CONT (MISCELLANEOUS) ×3 IMPLANT
BLADE CLIPPER SURG (BLADE) ×3 IMPLANT
BLADE STERNUM SYSTEM 6 (BLADE) ×3 IMPLANT
BNDG GAUZE DERMACEA FLUFF 4 (GAUZE/BANDAGES/DRESSINGS) ×3 IMPLANT
BNDG GZE DERMACEA 4 6PLY (GAUZE/BANDAGES/DRESSINGS) ×2
BULB SUCTION JP 400 (SUCTIONS) IMPLANT
CANISTER SUCT 3000ML PPV (MISCELLANEOUS) ×3 IMPLANT
CLIP TI WIDE RED SMALL 24 (CLIP) IMPLANT
CNTNR URN SCR LID CUP LEK RST (MISCELLANEOUS) ×3 IMPLANT
CONT SPEC 4OZ STRL OR WHT (MISCELLANEOUS) ×2
DERMABOND ADVANCED .7 DNX6 (GAUZE/BANDAGES/DRESSINGS) IMPLANT
DRAIN CHANNEL 19F RND (DRAIN) IMPLANT
DRAIN CHANNEL 24FR (DRAIN) IMPLANT
DRAIN CHANNEL 28F RND 3/8 FF (WOUND CARE) IMPLANT
DRAPE INCISE IOBAN 66X45 STRL (DRAPES) ×3 IMPLANT
DRSG AQUACEL AG ADV 3.5X 6 (GAUZE/BANDAGES/DRESSINGS) IMPLANT
ELECT BLADE 4.0 EZ CLEAN MEGAD (MISCELLANEOUS) ×2
ELECT CAUTERY BLADE 6.4 (BLADE) ×3 IMPLANT
ELECT REM PT RETURN 9FT ADLT (ELECTROSURGICAL) ×4
ELECTRODE BLDE 4.0 EZ CLN MEGD (MISCELLANEOUS) ×3 IMPLANT
ELECTRODE REM PT RTRN 9FT ADLT (ELECTROSURGICAL) ×6 IMPLANT
FELT TEFLON 1X6 (MISCELLANEOUS) ×3 IMPLANT
GAUZE SPONGE 4X4 12PLY STRL (GAUZE/BANDAGES/DRESSINGS) ×6 IMPLANT
GLOVE BIOGEL PI IND STRL 6.5 (GLOVE) IMPLANT
GLOVE ECLIPSE 7.5 STRL STRAW (GLOVE) ×6 IMPLANT
GOWN STRL REUS W/ TWL LRG LVL3 (GOWN DISPOSABLE) ×12 IMPLANT
GOWN STRL REUS W/TWL LRG LVL3 (GOWN DISPOSABLE) ×6
KIT BASIN OR (CUSTOM PROCEDURE TRAY) ×3 IMPLANT
KIT TURNOVER KIT B (KITS) ×3 IMPLANT
NS IRRIG 1000ML POUR BTL (IV SOLUTION) ×9 IMPLANT
PACK CHEST (CUSTOM PROCEDURE TRAY) ×3 IMPLANT
PAD ARMBOARD 7.5X6 YLW CONV (MISCELLANEOUS) ×6 IMPLANT
PAD ELECT DEFIB RADIOL ZOLL (MISCELLANEOUS) ×3 IMPLANT
PENCIL BUTTON HOLSTER BLD 10FT (ELECTRODE) ×3 IMPLANT
POSITIONER HEAD DONUT 9IN (MISCELLANEOUS) ×3 IMPLANT
SUT MNCRL AB 4-0 PS2 18 (SUTURE) ×6 IMPLANT
SUT VIC AB 0 CTX 36 (SUTURE) ×4
SUT VIC AB 0 CTX36XBRD ANTBCTR (SUTURE) ×6 IMPLANT
SUT VIC AB 2-0 CT1 27 (SUTURE) ×4
SUT VIC AB 2-0 CT1 TAPERPNT 27 (SUTURE) ×6 IMPLANT
SYSTEM SAHARA CHEST DRAIN ATS (WOUND CARE) ×3 IMPLANT
TAPE CLOTH SURG 4X10 WHT LF (GAUZE/BANDAGES/DRESSINGS) IMPLANT
TOWEL GREEN STERILE (TOWEL DISPOSABLE) ×3 IMPLANT
TOWEL GREEN STERILE FF (TOWEL DISPOSABLE) ×3 IMPLANT
WATER STERILE IRR 1000ML POUR (IV SOLUTION) ×6 IMPLANT

## 2022-12-23 NOTE — Anesthesia Postprocedure Evaluation (Signed)
Anesthesia Post Note  Patient: Maureen Jackson  Procedure(s) Performed: SUBXYPHOID PERICARDIAL WINDOW (Chest) TRANSESOPHAGEAL ECHOCARDIOGRAM (TEE)     Patient location during evaluation: PACU Anesthesia Type: General Level of consciousness: awake and alert Pain management: pain level controlled Vital Signs Assessment: post-procedure vital signs reviewed and stable Respiratory status: spontaneous breathing, nonlabored ventilation, respiratory function stable and patient connected to nasal cannula oxygen Cardiovascular status: blood pressure returned to baseline and stable Postop Assessment: no apparent nausea or vomiting Anesthetic complications: no   No notable events documented.  Last Vitals:  Vitals:   12/23/22 1030 12/23/22 1052  BP: 104/66 105/63  Pulse: 68 69  Resp: 16 17  Temp: 36.5 C 36.4 C  SpO2: 97% 97%    Last Pain:  Vitals:   12/23/22 1052  TempSrc: Oral  PainSc: 10-Worst pain ever                 Santa Lighter

## 2022-12-23 NOTE — Anesthesia Procedure Notes (Signed)
Central Venous Catheter Insertion Performed by: Santa Lighter, MD, anesthesiologist Start/End1/13/2024 7:25 AM, 12/23/2022 7:35 AM Preanesthetic checklist: patient identified, IV checked, site marked, risks and benefits discussed, surgical consent, monitors and equipment checked, pre-op evaluation, timeout performed and anesthesia consent Position: Trendelenburg Lidocaine 1% used for infiltration and patient sedated Hand hygiene performed , maximum sterile barriers used  and Seldinger technique used Catheter size: 8 Fr Total catheter length 16. Central line was placed.Double lumen Procedure performed using ultrasound guided technique. Ultrasound Notes:anatomy identified, needle tip was noted to be adjacent to the nerve/plexus identified, no ultrasound evidence of intravascular and/or intraneural injection and image(s) printed for medical record Attempts: 1 Following insertion, line sutured, dressing applied and Biopatch. Post procedure assessment: blood return through all ports, free fluid flow and no air  Patient tolerated the procedure well with no immediate complications.

## 2022-12-23 NOTE — Anesthesia Preprocedure Evaluation (Addendum)
Anesthesia Evaluation  Patient identified by MRN, date of birth, ID band Patient awake    Reviewed: Allergy & Precautions, NPO status , Patient's Chart, lab work & pertinent test results, reviewed documented beta blocker date and time   Airway Mallampati: IV  TM Distance: >3 FB Neck ROM: Full    Dental  (+) Teeth Intact, Dental Advisory Given   Pulmonary sleep apnea , former smoker   Pulmonary exam normal breath sounds clear to auscultation       Cardiovascular hypertension, Pt. on home beta blockers and Pt. on medications + CAD and +CHF  Normal cardiovascular exam+ dysrhythmias (LBBB)  Rhythm:Regular Rate:Normal  PERICARDIAL EFFUSION  Echo 12/22/21:  1. Large pericardial effusion up to 4.8 cm anterior to RV (subcostal) and  2.8 cm posterior to LV. The RV appears small and underfilled. There is  systolic collapse of the RA. The IVC is dilated. Overall, findings are  suspicious for cardiac tamponade, but   clinical correlation is recommended. Large pericardial effusion. The  pericardial effusion is posterior and lateral to the left ventricle and  anterior to the right ventricle.   2. Left ventricular ejection fraction, by estimation, is 30 to 35%. The  left ventricle has moderately decreased function. Left ventricular  endocardial border not optimally defined to evaluate regional wall motion.   3. Right ventricular systolic function is normal. The right ventricular  size is normal.   4. The inferior vena cava is dilated in size with >50% respiratory  variability, suggesting right atrial pressure of 8 mmHg.      Neuro/Psych TIA   GI/Hepatic Neg liver ROS,GERD  ,,  Endo/Other  diabetes, Type 2, Insulin Dependent, Oral Hypoglycemic AgentsHypothyroidism  Morbid obesity  Renal/GU Renal disease (AKI)     Musculoskeletal  (+) Arthritis ,    Abdominal   Peds  Hematology  (+) Blood dyscrasia (Plavix), anemia    Anesthesia Other Findings   Reproductive/Obstetrics                             Anesthesia Physical Anesthesia Plan  ASA: 4  Anesthesia Plan: General   Post-op Pain Management: Ketamine IV*   Induction: Intravenous  PONV Risk Score and Plan: 3 and Dexamethasone and Ondansetron  Airway Management Planned: Oral ETT and Video Laryngoscope Planned  Additional Equipment: CVP, TEE, Ultrasound Guidance Line Placement and Arterial line  Intra-op Plan:   Post-operative Plan: Possible Post-op intubation/ventilation  Informed Consent: I have reviewed the patients History and Physical, chart, labs and discussed the procedure including the risks, benefits and alternatives for the proposed anesthesia with the patient or authorized representative who has indicated his/her understanding and acceptance.     Dental advisory given  Plan Discussed with: CRNA  Anesthesia Plan Comments: (TEE FOR INTRA-OPERATIVE MONITORING ONLY.)        Anesthesia Quick Evaluation

## 2022-12-23 NOTE — Op Note (Addendum)
CARDIOVASCULAR SURGERY OPERATIVE NOTE  12/23/2022 Maureen Jackson [id number]  Surgeon:  Everlena Cooper, MD   Preoperative Diagnosis:  Pericardial Effusion  Postoperative Diagnosis:  Same   Procedure: Subxyphoid Pericardial window with drainage of pericardial effusion and percardial biopsy and placement of percardial drain   Anesthesia:  General Endotracheal   Clinical History/Surgical Indication: 67 yo with CAD sp PCI and ischemic cardiomyopathy, DM , obesity who was admitted with SOB and on CT of chest was noted to have large pericardial effusion. Pt had this confirmed by echo again with EF 35%. Pt on plavix and so was attempted to have percardiocentesis by IC today however secondary to pt's habitus was unable to gain access.    Preparation:  The patient was seen in the preoperative holding area and the correct patient, correct operation were confirmed with the patient after reviewing the medical record and catheterization. The consent was signed by me. Preoperative antibiotics were given.  The patient was taken back to the operating room and positioned supine on the operating room table. After being placed under general endotracheal anesthesia by the anesthesia team a foley catheter was placed. The neck, chest, abdomen, and both legs were prepped with betadine soap and solution and draped in the usual sterile manner. A surgical time-out was taken and the correct patient and operative procedure were confirmed with the nursing and anesthesia staff.   Pre-bypass TEE:  Large pericardial effusion and EF 35%   Post-bypass TEE: Pericardial effusion removed and EF still 35% Fluid: over 500cc Operation: A subxiphoid incision was then created and this was carried down to the xiphoid region.  We noted that the patient is extremely obese and that this was a very deep tunnel.  Xiphoid process was then applied and resected utilizing the sternal saw.  The pericardium was then identified below  this and was opened with the Metzenbaum scissors.  Serosanguineous fluid was noted and was collected and sent for specimens and culture and pathology.  A portion of the pericardium was then incised and also sent for pathology.  Hemostasis was obtained and a separate stab wound was created and a 24 Blake drain was inserted into the pericardial space.  This was secured.  TEE confirmed resolution of the pericardial effusion.  With hemostasis obtained the wound was then closed in multiple layers observable suture sterile dressings were applied.  Patient tolerated procedure well.  I was present through 100% of the procedure.

## 2022-12-23 NOTE — Progress Notes (Signed)
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  She was admitted with acute systolic CHF exacerbation with LVEF 30-35% and noted pericardial effusion that is concerning for possible tamponade.  She was seen by cardiology and started on IV diuresis.  Diuresis and antihypertensives held on 1/11 due to softer blood pressure readings.  Patient seen by cardiology with recommendations to transfer to [facility] for cardiothoracic surgery evaluation for pericardial window, but cardiothoracic surgery advised bradycardia and symptoms his first, plan is to have cardiothoracic surgery and cardiology to discuss for next plan of action 1/12  Patient was seen by CT surgery and cardiology 1/12: Attempted pericardiocentesis but unsuccessful CT surgery consulted for pericardial window    Subjective: Seen and examined Patient underwent pericardial window placement this morning  C/o having soreness in chest Rt  IJ, on 2l [place] Drain + pericardial  Assessment and Plan: Principal Problem:   Acute on chronic combined systolic and diastolic CHF (congestive heart failure) ([place]) Active Problems:   Hypothyroidism   Essential hypertension, benign   Morbid obesity/BMI > 55   DM type 2 causing vascular disease ([place])   Pericardial effusion   Coronary artery disease involving native coronary artery of native heart without angina pectoris   Ischemic cardiomyopathy   Cardiac tamponade  Acute on chronic systolic congestive heart failure Pericardial effusion with dyspnea worsening past 2 weeks: Transferred from [facility] plan for CT surgery evaluation, pericardiocentesis attempted on/still unsuccessful  so underwent pericardial window with drain placement and biopsy 1/13 by CT surgery.  Continue pain control, drain management per CT surgery follow-up pathology and culture report.  Ischemic cardiomyopathy/CAD-DES to LAD and RCA 2018/PAD Hyperretension Hyperlipidemia: BP is well-controlled this morning.  Continue Lipitor, Plavix.Holding Entresto Aldactone Coreg and diuretics  AKI on stage IIIa CKD: Creatinine slightly better at 1.9 monitor hopefully improve with pericardial window  Recent Labs    02/06/22 1837 02/22/22 0956 08/26/22 2027 12/08/22 1110 12/20/22 0953 12/21/22 0435 12/22/22 0121 12/23/22 0052  BUN '19 20 20 '$ 32* 21 24* 28* 30*  CREATININE 1.16* 1.22* 1.17* 1.71* 1.47* 1.64* 2.14* 1.99*   Severe hypothyroidism TSH 130 continue levothyroxine as ordered,followed by endocrinology Dr. Dorris Fetch, remote history of thyroidectomy for benign goiter  Type 2 diabetes mellitus A1c 7.3 on Tresiba 50 units at nighttime NovoLog 10 units 3 times dail y at home. Currently on NovoLog 10 units 3 times daily, SSI. Cont holding ozempicm monitor and adjust Recent Labs  Lab 12/20/22 0953 12/20/22 1739 12/22/22 1506 12/22/22 1706 12/22/22 2108 12/23/22 0557 12/23/22 1004  GLUCAP  --    < > 88 96 110* 136* 137*  HGBA1C 7.2*  --   --   --   --   --   --    < > = values in this interval not displayed.    Morbid Obesity:Patient's Body mass index is 56.82 kg/m. : Will benefit with PCP follow-up, weight loss  healthy lifestyle OSA patient noncompliant with CPAP.  DVT prophylaxis: heparin injection 5,000 Units Start: 12/20/22 1400 Code Status:   Code Status: Full Code Family Communication: plan of care discussed with patient at bedside. Patient status is: inpatient because of pericardial efffusion  Level of care: Telemetry Cardiac  Dispo:  The patient is from: Home            Anticipated disposition: TBD Objective: Vitals last 24 hrs: Vitals:   12/23/22 0715 12/23/22 1000 12/23/22 1015  12/23/22 1052  BP: 131/79 (!) 111/40 111/67   Pulse: 69 66 67 69  Resp: '17 16 16   '$ Temp: 98.7 F (37.1 C) (!) 97.3 F (36.3 C)    TempSrc: Oral   Oral  SpO2: 96% (!) 88% 94% 97%  Weight:      Height:       Weight change:   Physical Examination: General exam: drowsy, weak,older appearing HEENT:Oral mucosa moist, Ear/Nose WNL grossly, dentition normal. Respiratory system: bilaterally clear BS,no use of accessory muscle Cardiovascular system: S1 & S2 +, regular rate,. Gastrointestinal system: Abdomen soft, NT,ND,BS+ Nervous System:Alert, awake, moving extremities and grossly nonfocal Extremities: LE ankle edema neg , lower extremities warm Skin: No rashes,no icterus. MSK: Normal muscle bulk,tone, power  Rt  IJ, on 2l [place] Drain + pericardial  Medications reviewed:  Scheduled Meds:  atorvastatin  80 mg Oral QPM   brimonidine  1 drop Both Eyes BID   And   timolol  1 drop Both Eyes BID   calcium carbonate  4 tablet Oral QPM   clopidogrel  75 mg Oral Daily   heparin  5,000 Units Subcutaneous Q8H   insulin aspart  0-15 Units Subcutaneous TID WC   insulin aspart  0-5 Units Subcutaneous QHS   insulin aspart  10 Units Subcutaneous TID WC   levothyroxine  200 mcg Oral QAC breakfast   levothyroxine  50 mcg Oral QAC breakfast   loratadine  10 mg Oral Daily   senna-docusate  2 tablet Oral BID   sodium chloride flush  3 mL Intravenous Q12H   Continuous Infusions:  sodium chloride     sodium chloride 10 mL/hr at 12/22/22 1313    Pressure Injury 09/20/19 Buttocks Left Stage II -  Partial thickness loss of dermis presenting as a shallow open ulcer with a red, pink wound bed without slough. (Active)  09/20/19 1302  Location: Buttocks  Location Orientation: Left  Staging: Stage II -  Partial thickness loss of dermis presenting as a shallow open ulcer with a red, pink wound bed without slough.  Wound Description (Comments):   Present on Admission:    Diet Order     None                 Intake/Output Summary (Last 24 hours) at 12/23/2022 1054 Last data filed at 12/23/2022 0951 Gross per 24 hour  Intake 950 ml  Output 500 ml  Net 450 ml   Net IO Since Admission: 133 mL [12/23/22 1054]  Wt Readings from Last 3 Encounters:  12/23/22 (!) 159.6 kg  12/08/22 (!) 161.5 kg  07/26/22 (!) 161.5 kg     Unresulted Labs (From admission, onward)     Start     Ordered   12/23/22 0853  Aerobic/Anaerobic Culture w Gram Stain (surgical/deep wound)  RELEASE UPON ORDERING,   STAT       Comments: Specimen B: Phone [phone]         Previous Biopsy:  no Is the patient on airborne/droplet precautions? No           Clinical History:  pericardial fluid Copy of Report to:  Dr. Lavonna Monarch Specimen Disposition: MICRO     12/23/22 0853   12/23/22 0853  Fungus Stain  RELEASE UPON ORDERING,   STAT  Comments: Specimen B: Phone [phone]         Previous Biopsy:  no Is the patient on airborne/droplet precautions? No           Clinical History:  pericardial fluid Copy of Report to:  Dr. Lavonna Monarch Specimen Disposition: MICRO     12/23/22 0853   12/23/22 0853  Acid Fast Culture with reflexed sensitivities  RELEASE UPON ORDERING,   STAT       Comments: Specimen B: Phone [phone]         Previous Biopsy:  no Is the patient on airborne/droplet precautions? No           Clinical History:  pericardial fluid Copy of Report to:  Dr. Lavonna Monarch Specimen Disposition: MICRO     12/23/22 0853   12/23/22 0853  Acid Fast Smear (AFB)  RELEASE UPON ORDERING,   STAT       Comments: Specimen B: Phone [phone]         Previous Biopsy:  no Is the patient on airborne/droplet precautions? No           Clinical History:  pericardial fluid Copy of Report to:  Dr. Lavonna Monarch Specimen Disposition: MICRO     12/23/22 0853   12/23/22 0853  Body fluid culture w Gram Stain  RELEASE UPON ORDERING,   STAT       Comments: Specimen B: Phone [phone]         Previous Biopsy:  no Is the patient on  airborne/droplet precautions? No           Clinical History:  pericardial fluid Copy of Report to:  Dr. Lavonna Monarch Specimen Disposition: MICRO     12/23/22 0853   12/22/22 1544  Glucose, Body Fluid Other  RELEASE UPON ORDERING,   TIMED       Comments: Specimen A: Pre-op diagnosis: chest pain    12/22/22 1544   12/22/22 1544  Protein, body fluid (other)  RELEASE UPON ORDERING,   TIMED       Comments: Specimen A: Pre-op diagnosis: chest pain    12/22/22 1544   12/22/22 1544  LD, Body Fluid (other)  RELEASE UPON ORDERING,   TIMED       Comments: Specimen A: Pre-op diagnosis: chest pain    12/22/22 1544   12/22/22 1544  Body fluid culture w Gram Stain  RELEASE UPON ORDERING,   TIMED       Comments: Specimen A: Pre-op diagnosis: chest pain    12/22/22 1544   12/22/22 1544  Body fluid cell count with differential  RELEASE UPON ORDERING,   TIMED       Comments: Specimen A: Pre-op diagnosis: chest pain    12/22/22 1544   12/21/22 3825  Basic metabolic panel  Daily,   R     Comments: As Scheduled for 5 days    12/20/22 1338          Data Reviewed: I have personally reviewed following labs and imaging studies CBC: Recent Labs  Lab 12/20/22 0953 12/21/22 0435 12/22/22 0121  WBC 5.9 5.5 6.9  NEUTROABS 3.2  --   --   HGB 11.1* 10.2* 10.3*  HCT 36.5 34.1* 33.8*  MCV 91.5 93.2 92.1  PLT 204 179 053   Basic Metabolic Panel: Recent Labs  Lab 12/20/22 0953 12/21/22 0435 12/22/22 0121 12/23/22 0052  NA 142 145 142 138  K 3.9 3.8 4.1 4.1  CL 107 109 107 105  CO2 '25 29 25 '$ 25  GLUCOSE 151* 126* 78 125*  BUN 21 24* 28* 30*  CREATININE 1.47* 1.64* 2.14* 1.99*  CALCIUM 8.5* 8.3* 8.0* 8.1*  MG  --  2.1 2.1  --    Recent Labs  Lab 12/22/22 1506 12/22/22 1706 12/22/22 2108 12/23/22 0557 12/23/22 1004  GLUCAP 88 96 110* 136* 137*   Lipid Profile: No results for input(s): "CHOL", "HDL", "LDLCALC", "TRIG", "CHOLHDL", "LDLDIRECT" in the last 72 hours. Thyroid Function  Tests: No results for input(s): "TSH", "T4TOTAL", "FREET4", "T3FREE", "THYROIDAB" in the last 72 hours.  Sepsis Labs: No results for input(s): "PROCALCITON", "LATICACIDVEN" in the last 168 hours.  Recent Results (from the past 240 hour(s))  Resp panel by RT-PCR (RSV, Flu A&B, Covid) Anterior Nasal Swab     Status: None   Collection Time: 12/20/22 10:11 AM   Specimen: Anterior Nasal Swab  Result Value Ref Range Status   SARS Coronavirus 2 by RT PCR NEGATIVE NEGATIVE Final    Comment: (NOTE) SARS-CoV-2 target nucleic acids are NOT DETECTED.  The SARS-CoV-2 RNA is generally detectable in upper respiratory specimens during the acute phase of infection. The lowest concentration of SARS-CoV-2 viral copies this assay can detect is 138 copies/mL. A negative result does not preclude SARS-Cov-2 infection and should not be used as the sole basis for treatment or other patient management decisions. A negative result may occur with  improper specimen collection/handling, submission of specimen other than nasopharyngeal swab, presence of viral mutation(s) within the areas targeted by this assay, and inadequate number of viral copies(<138 copies/mL). A negative result must be combined with clinical observations, patient history, and epidemiological information. The expected result is Negative.  Fact Sheet for Patients:  EntrepreneurPulse.com.au  Fact Sheet for Healthcare Providers:  IncredibleEmployment.be  This test is no t yet approved or cleared by the [place] FDA and  has been authorized for detection and/or diagnosis of SARS-CoV-2 by FDA under an Emergency Use Authorization (EUA). This EUA will remain  in effect (meaning this test can be used) for the duration of the COVID-19 declaration under Section 564(b)(1) of the Act, 21 U.S.C.section 360bbb-3(b)(1), unless the authorization is terminated  or revoked sooner.       Influenza A by PCR  NEGATIVE NEGATIVE Final   Influenza B by PCR NEGATIVE NEGATIVE Final    Comment: (NOTE) The Xpert Xpress SARS-CoV-2/FLU/RSV plus assay is intended as an aid in the diagnosis of influenza from Nasopharyngeal swab specimens and should not be used as a sole basis for treatment. Nasal washings and aspirates are unacceptable for Xpert Xpress SARS-CoV-2/FLU/RSV testing.  Fact Sheet for Patients: EntrepreneurPulse.com.au  Fact Sheet for Healthcare Providers: IncredibleEmployment.be  This test is not yet approved or cleared by the [place] FDA and has been authorized for detection and/or diagnosis of SARS-CoV-2 by FDA under an Emergency Use Authorization (EUA). This EUA will remain in effect (meaning this test can be used) for the duration of the COVID-19 declaration under Section 564(b)(1) of the Act, 21 U.S.C. section 360bbb-3(b)(1), unless the authorization is terminated or revoked.     Resp Syncytial Virus by PCR NEGATIVE NEGATIVE Final    Comment: (NOTE) Fact Sheet for Patients: EntrepreneurPulse.com.au  Fact Sheet for Healthcare Providers: IncredibleEmployment.be  This test is not yet approved or cleared by the [place] FDA and has been authorized for detection and/or diagnosis of SARS-CoV-2 by FDA under an Emergency Use Authorization (EUA). This EUA will remain in effect (meaning this test can be used) for the duration of the  COVID-19 declaration under Section 564(b)(1) of the Act, 21 U.S.C. section 360bbb-3(b)(1), unless the authorization is terminated or revoked.  Performed at [facility], [address], [place], [place] [address]   Surgical pcr screen     Status: None   Collection Time: 12/23/22  7:12 AM   Specimen: Nasal Mucosa; Nasal Swab  Result Value Ref Range Status   MRSA, PCR NEGATIVE NEGATIVE Final   Staphylococcus aureus NEGATIVE NEGATIVE Final    Comment: (NOTE) The Xpert SA  Assay (FDA approved for NASAL specimens in patients 64 years of age and older), is one component of a comprehensive surveillance program. It is not intended to diagnose infection nor to guide or monitor treatment. Performed at [facility], [place] [address] [place], [place] [address]     Antimicrobials: Anti-infectives (From admission, onward)    None      Culture/Microbiology    Component Value Date/Time   SDES LEFT ANTECUBITAL 12/26/2019 1713   SPECREQUEST  12/26/2019 1713    BOTTLES DRAWN AEROBIC AND ANAEROBIC Blood Culture adequate volume   CULT  12/26/2019 1713    NO GROWTH 5 DAYS Performed at [facility], [address] [place], [place] [address]    REPTSTATUS 12/31/2019 FINAL 12/26/2019 1713   Other culture-see note  Radiology Studies: CARDIAC CATHETERIZATION  Result Date: 12/22/2022 1.  Unsuccessful pericardiocentesis and pericardial drain placement due to very protuberant abdomen; echocardiography on site could not identify an apical area for needle placement. Recommendation: Consider pericardial window.   ECHOCARDIOGRAM LIMITED  Result Date: 12/22/2022    ECHOCARDIOGRAM LIMITED REPORT   Patient Name:   Maureen Jackson Date of Exam: 12/22/2022 Medical Rec #:  [id number]       Height:       66.0 in Accession #:    [id number]      Weight:       351.9 lb Date of Birth:  Apr 21, 1956        BSA:          2.542 m Patient Age:    89 years        BP:           128/79 mmHg Patient Gender: F               HR:           66 bpm. Exam Location:  Inpatient Procedure: Limited Echo Indications:    Pericardial effusion I31.3  History:        Patient has prior history of Echocardiogram examinations, most                 recent 12/20/2022. CHF, CAD, Arrythmias:LBBB; Risk                 Factors:Hypertension and Sleep Apnea.  Sonographer:    Darlina Sicilian RDCS Referring Phys: [id number] [place]  1. Large pericardial effusion up to 4.8 cm anterior to RV (subcostal) and 2.8 cm  posterior to LV. The RV appears small and underfilled. There is systolic collapse of the RA. The IVC is dilated. Overall, findings are suspicious for cardiac tamponade, but  clinical correlation is recommended. Large pericardial effusion. The pericardial effusion is posterior and lateral to the left ventricle and anterior to the right ventricle.  2. Left ventricular ejection fraction, by estimation, is 30 to 35%. The left ventricle has moderately decreased function. Left ventricular endocardial border not optimally defined to evaluate regional wall motion.  3. Right ventricular  systolic function is normal. The right ventricular size is normal.  4. The inferior vena cava is dilated in size with >50% respiratory variability, suggesting right atrial pressure of 8 mmHg. FINDINGS  Left Ventricle: Left ventricular ejection fraction, by estimation, is 30 to 35%. The left ventricle has moderately decreased function. Left ventricular endocardial border not optimally defined to evaluate regional wall motion. Right Ventricle: The right ventricular size is normal. Right ventricular systolic function is normal. Pericardium: Large pericardial effusion up to 4.8 cm anterior to RV (subcostal) and 2.8 cm posterior to LV. The RV appears small and underfilled. There is systolic collapse of the RA. The IVC is dilated. Overall, findings are suspicious for cardiac tamponade, but clinical correlation is recommended. A large pericardial effusion is present. The pericardial effusion is posterior and lateral to the left ventricle and anterior to the right ventricle. Venous: The inferior vena cava is dilated in size with greater than 50% respiratory variability, suggesting right atrial pressure of 8 mmHg. Eleonore Chiquito MD Electronically signed by Eleonore Chiquito MD Signature Date/Time: 12/22/2022/11:07:58 AM    Final      LOS: 3 days   Antonieta Pert, MD [facility]  12/23/2022, 10:54 AM

## 2022-12-23 NOTE — Progress Notes (Signed)
Patient does not wish to weap CPAP while in the hospital.

## 2022-12-23 NOTE — Interval H&P Note (Signed)
History and Physical Interval Note:  12/23/2022 6:38 AM  Maureen Jackson  has presented today for surgery, with the diagnosis of PERICARDIAL EFFUSION.  The various methods of treatment have been discussed with the patient and family. After consideration of risks, benefits and other options for treatment, the patient has consented to  Procedure(s): SUBXYPHOID PERICARDIAL WINDOW (N/A) TRANSESOPHAGEAL ECHOCARDIOGRAM (TEE) (N/A) as a surgical intervention.  The patient's history has been reviewed, patient examined, no change in status, stable for surgery.  I have reviewed the patient's chart and labs.  Questions were answered to the patient's satisfaction.     Coralie Common

## 2022-12-23 NOTE — Progress Notes (Signed)
Patient brought to 4E from PACU. VSS. Telemetry box applied, CCMD notified. Patient oriented to room and staff. Call bell in reach.   Hannah Crill L Zavannah Deblois, RN  

## 2022-12-23 NOTE — Progress Notes (Signed)
Rounding Note    Patient Name: Maureen Jackson Date of Encounter: 12/23/2022  [place] Cardiologist: Carlyle Dolly, MD   Subjective   Pt is s/p pericardial window  Inpatient Medications    Scheduled Meds:  [MAR Hold] atorvastatin  80 mg Oral QPM   [MAR Hold] brimonidine  1 drop Both Eyes BID   And   [MAR Hold] timolol  1 drop Both Eyes BID   [MAR Hold] calcium carbonate  4 tablet Oral QPM   chlorhexidine  15 mL Mouth/Throat Once   Or   mouth rinse  15 mL Mouth Rinse Once   [MAR Hold] clopidogrel  75 mg Oral Daily   [MAR Hold] heparin  5,000 Units Subcutaneous Q8H   [MAR Hold] insulin aspart  0-15 Units Subcutaneous TID WC   [MAR Hold] insulin aspart  0-5 Units Subcutaneous QHS   [MAR Hold] insulin aspart  10 Units Subcutaneous TID WC   [MAR Hold] levothyroxine  200 mcg Oral QAC breakfast   [MAR Hold] levothyroxine  50 mcg Oral QAC breakfast   [MAR Hold] loratadine  10 mg Oral Daily   [MAR Hold] senna-docusate  2 tablet Oral BID   [MAR Hold] sodium chloride flush  3 mL Intravenous Q12H   Continuous Infusions:  [MAR Hold] sodium chloride     sodium chloride 10 mL/hr at 12/22/22 1313   lactated ringers Stopped (12/23/22 0910)   PRN Meds: [JIR Hold] sodium chloride, [MAR Hold] acetaminophen, [MAR Hold] albuterol, [MAR Hold] diclofenac Sodium, [MAR Hold] insulin glargine-yfgn, [MAR Hold] ondansetron (ZOFRAN) IV, [MAR Hold] sodium chloride flush   Vital Signs    Vitals:   12/22/22 2258 12/23/22 0445 12/23/22 0711 12/23/22 0715  BP: 111/74 124/67  131/79  Pulse: 61 64  69  Resp: '16 20  17  '$ Temp: 98.1 F (36.7 C) 97.7 F (36.5 C)  98.7 F (37.1 C)  TempSrc: Oral Axillary  Oral  SpO2: 97% 96%  96%  Weight:   (!) 159.6 kg   Height:   5' 5.98" (1.676 m)     Intake/Output Summary (Last 24 hours) at 12/23/2022 1000 Last data filed at 12/23/2022 0910 Gross per 24 hour  Intake 150 ml  Output 450 ml  Net -300 ml       12/23/2022    7:11 AM  12/21/2022    9:30 PM 12/21/2022    5:26 AM  Last 3 Weights  Weight (lbs) 351 lb 13.7 oz 351 lb 13.7 oz 356 lb 0.7 oz  Weight (kg) 159.6 kg 159.6 kg 161.5 kg      Telemetry    Sinus - Personally Reviewed   Physical Exam   GEN: NAD, obese Neck: supple Cardiac: RRR Respiratory: CTA anteriorly GI: Soft, s/p pericardial window MS: No edema Neuro:  Nonfocal  Psych: Normal affect   Labs    High Sensitivity Troponin:   Recent Labs  Lab 12/08/22 1110 12/08/22 1255 12/20/22 0953 12/20/22 1142  TROPONINIHS '10 11 12 10      '$ Chemistry Recent Labs  Lab 12/21/22 0435 12/22/22 0121 12/23/22 0052  NA 145 142 138  K 3.8 4.1 4.1  CL 109 107 105  CO2 '29 25 25  '$ GLUCOSE 126* 78 125*  BUN 24* 28* 30*  CREATININE 1.64* 2.14* 1.99*  CALCIUM 8.3* 8.0* 8.1*  MG 2.1 2.1  --   GFRNONAA 35* 25* 28*  ANIONGAP '7 10 8    '$ Hematology Recent Labs  Lab 12/20/22 0953 12/21/22 0435 12/22/22  0121  WBC 5.9 5.5 6.9  RBC 3.99 3.66* 3.67*  HGB 11.1* 10.2* 10.3*  HCT 36.5 34.1* 33.8*  MCV 91.5 93.2 92.1  MCH 27.8 27.9 28.1  MCHC 30.4 29.9* 30.5  RDW 15.4 15.6* 15.4  PLT 204 179 182    Thyroid  Recent Labs  Lab 12/20/22 0956  TSH 130.159*     BNP Recent Labs  Lab 12/20/22 0953  BNP 74.0      Radiology    CARDIAC CATHETERIZATION  Result Date: 12/22/2022 1.  Unsuccessful pericardiocentesis and pericardial drain placement due to very protuberant abdomen; echocardiography on site could not identify an apical area for needle placement. Recommendation: Consider pericardial window.   ECHOCARDIOGRAM LIMITED  Result Date: 12/22/2022    ECHOCARDIOGRAM LIMITED REPORT   Patient Name:   Maureen Jackson Date of Exam: 12/22/2022 Medical Rec #:  [id number]       Height:       66.0 in Accession #:    [id number]      Weight:       351.9 lb Date of Birth:  [birth year]        BSA:          2.542 m Patient Age:    67 years        BP:           128/79 mmHg Patient Gender: F               HR:            66 bpm. Exam Location:  Inpatient Procedure: Limited Echo Indications:    Pericardial effusion I31.3  History:        Patient has prior history of Echocardiogram examinations, most                 recent 12/20/2022. CHF, CAD, Arrythmias:LBBB; Risk                 Factors:Hypertension and Sleep Apnea.  Sonographer:    Darlina Sicilian RDCS Referring Phys: [id number] [place]  1. Large pericardial effusion up to 4.8 cm anterior to RV (subcostal) and 2.8 cm posterior to LV. The RV appears small and underfilled. There is systolic collapse of the RA. The IVC is dilated. Overall, findings are suspicious for cardiac tamponade, but  clinical correlation is recommended. Large pericardial effusion. The pericardial effusion is posterior and lateral to the left ventricle and anterior to the right ventricle.  2. Left ventricular ejection fraction, by estimation, is 30 to 35%. The left ventricle has moderately decreased function. Left ventricular endocardial border not optimally defined to evaluate regional wall motion.  3. Right ventricular systolic function is normal. The right ventricular size is normal.  4. The inferior vena cava is dilated in size with >50% respiratory variability, suggesting right atrial pressure of 8 mmHg. FINDINGS  Left Ventricle: Left ventricular ejection fraction, by estimation, is 30 to 35%. The left ventricle has moderately decreased function. Left ventricular endocardial border not optimally defined to evaluate regional wall motion. Right Ventricle: The right ventricular size is normal. Right ventricular systolic function is normal. Pericardium: Large pericardial effusion up to 4.8 cm anterior to RV (subcostal) and 2.8 cm posterior to LV. The RV appears small and underfilled. There is systolic collapse of the RA. The IVC is dilated. Overall, findings are suspicious for cardiac tamponade, but clinical correlation is recommended. A large pericardial effusion is present. The  pericardial effusion is posterior and lateral to the  left ventricle and anterior to the right ventricle. Venous: The inferior vena cava is dilated in size with greater than 50% respiratory variability, suggesting right atrial pressure of 8 mmHg. Eleonore Chiquito MD Electronically signed by Eleonore Chiquito MD Signature Date/Time: 12/22/2022/11:07:58 AM    Final      Patient Profile     68 y.o. female with past medical history of coronary artery disease, ischemic cardiomyopathy, pericardial effusion, diabetes mellitus, peripheral vascular disease, obstructive sleep apnea, hypertension transferred from endocrine for management of pericardial effusion.  Patient was admitted with dyspnea and given Lasix with subsequent hypotension.  She is felt to potentially be in tamponade and was transferred to [facility] for further management.  Echocardiogram performed January 10 showed ejection fraction 30 to 35% and large pericardial effusion more prominent in the posterior lateral distribution.  Chest CT showed no pulmonary embolus but there was note of a large pericardial effusion.  Assessment & Plan    1 pericardial effusion-patient was admitted with worsening dyspnea over the preceding 2 weeks and lower extremity edema.  She was given Lasix and by report became hypotensive.  Her echocardiogram from January 10 has been personally reviewed.  She had a large pericardial effusion with a significant amount in the posterior lateral distribution.  Attempt at pericardiocentesis unsuccessful. Now S/P pericardial window. Await pathology and cultures.  2 coronary artery disease-continue statin.  3 ischemic cardiomyopathy-BP prior to pericardial window borderline and GDMT (entresto, coreg, spironolactone) held; will resume in next 24 hours as BP improves and renal function allows.   4 acute on chronic stage IIIa kidney disease-Cr with some improvement today; hopefully will continue to improve following pericardial window  (improved renal perfusion).   For questions or updates, please contact [place] Please consult [email or web address] for contact info under        Signed, Kirk Ruths, MD  12/23/2022, 10:00 AM

## 2022-12-23 NOTE — Anesthesia Procedure Notes (Signed)
Arterial Line Insertion Start/End1/13/2024 7:20 AM Performed by: Leonor Liv, CRNA  Lidocaine 1% used for infiltration Left, radial was placed  Attempts: 1 Procedure performed without using ultrasound guided technique.

## 2022-12-23 NOTE — Anesthesia Procedure Notes (Signed)
Procedure Name: Intubation Date/Time: 12/23/2022 8:03 AM  Performed by: Clearnce Sorrel, CRNAPre-anesthesia Checklist: Patient identified, Emergency Drugs available, Suction available and Patient being monitored Patient Re-evaluated:Patient Re-evaluated prior to induction Oxygen Delivery Method: Circle System Utilized Preoxygenation: Pre-oxygenation with 100% oxygen Induction Type: IV induction Ventilation: Mask ventilation without difficulty and Oral airway inserted - appropriate to patient size Laryngoscope Size: Glidescope and 4 (for expected difficult airway) Grade View: Grade I Tube type: Oral Tube size: 7.0 mm Number of attempts: 1 Airway Equipment and Method: Stylet and Oral airway Placement Confirmation: ETT inserted through vocal cords under direct vision, positive ETCO2 and breath sounds checked- equal and bilateral Secured at: 23 cm Tube secured with: Tape Dental Injury: Teeth and Oropharynx as per pre-operative assessment

## 2022-12-23 NOTE — Transfer of Care (Signed)
Immediate Anesthesia Transfer of Care Note  Patient: Ishika N Riffe  Procedure(s) Performed: SUBXYPHOID PERICARDIAL WINDOW (Chest) TRANSESOPHAGEAL ECHOCARDIOGRAM (TEE)  Patient Location: PACU  Anesthesia Type:General  Level of Consciousness: awake and pateint uncooperative  Airway & Oxygen Therapy: Patient Spontanous Breathing and Patient connected to face mask oxygen  Post-op Assessment: Report given to RN and Post -op Vital signs reviewed and stable  Post vital signs: Reviewed and stable  Last Vitals:  Vitals Value Taken Time  BP 111/70 12/23/22 1002  Temp    Pulse 66 12/23/22 1008  Resp 14 12/23/22 1008  SpO2 98 % 12/23/22 1008  Vitals shown include unvalidated device data.  Last Pain:  Vitals:   12/23/22 0715  TempSrc: Oral  PainSc:          Complications: No notable events documented.

## 2022-12-24 ENCOUNTER — Encounter (HOSPITAL_COMMUNITY): Payer: Self-pay | Admitting: Thoracic Surgery (Cardiothoracic Vascular Surgery)

## 2022-12-24 ENCOUNTER — Inpatient Hospital Stay (HOSPITAL_COMMUNITY): Payer: 59

## 2022-12-24 DIAGNOSIS — I5043 Acute on chronic combined systolic (congestive) and diastolic (congestive) heart failure: Secondary | ICD-10-CM | POA: Diagnosis not present

## 2022-12-24 LAB — GLUCOSE, CAPILLARY
Glucose-Capillary: 187 mg/dL — ABNORMAL HIGH (ref 70–99)
Glucose-Capillary: 143 mg/dL — ABNORMAL HIGH (ref 70–99)
Glucose-Capillary: 145 mg/dL — ABNORMAL HIGH (ref 70–99)
Glucose-Capillary: 122 mg/dL — ABNORMAL HIGH (ref 70–99)

## 2022-12-24 LAB — CBC
HCT: 36 % (ref 36.0–46.0)
Hemoglobin: 11.4 g/dL — ABNORMAL LOW (ref 12.0–15.0)
MCH: 28.4 pg (ref 26.0–34.0)
MCHC: 31.7 g/dL (ref 30.0–36.0)
MCV: 89.8 fL (ref 80.0–100.0)
Platelets: 189 10*3/uL (ref 150–400)
RBC: 4.01 MIL/uL (ref 3.87–5.11)
RDW: 14.6 % (ref 11.5–15.5)
WBC: 11.2 10*3/uL — ABNORMAL HIGH (ref 4.0–10.5)
nRBC: 0 % (ref 0.0–0.2)

## 2022-12-24 LAB — BASIC METABOLIC PANEL
Anion gap: 7 (ref 5–15)
BUN: 38 mg/dL — ABNORMAL HIGH (ref 8–23)
CO2: 27 mmol/L (ref 22–32)
Calcium: 7.6 mg/dL — ABNORMAL LOW (ref 8.9–10.3)
Chloride: 105 mmol/L (ref 98–111)
Creatinine, Ser: 2.17 mg/dL — ABNORMAL HIGH (ref 0.44–1.00)
GFR, Estimated: 25 mL/min — ABNORMAL LOW (ref >60)
Glucose, Bld: 197 mg/dL — ABNORMAL HIGH (ref 70–99)
Potassium: 4.7 mmol/L (ref 3.5–5.1)
Sodium: 139 mmol/L (ref 135–145)

## 2022-12-24 MED ORDER — INSULIN GLARGINE-YFGN 100 UNIT/ML ~~LOC~~ SOLN
10.0000 [IU] | Freq: Every day | SUBCUTANEOUS | Status: DC
  Administered 2022-12-24 – 2023-01-02 (×10): 10 [IU] via SUBCUTANEOUS
  Filled 2022-12-24 (×12): qty 0.1

## 2022-12-24 MED ORDER — INSULIN ASPART 100 UNIT/ML IJ SOLN
7.0000 [IU] | Freq: Three times a day (TID) | INTRAMUSCULAR | Status: DC
  Administered 2022-12-24 – 2022-12-26 (×3): 7 [IU] via SUBCUTANEOUS

## 2022-12-24 NOTE — Progress Notes (Signed)
Pt sleeping, SpO2 down to 83% at rest, 2L O2 via  applied and back up to 94%.  Sats have been WNL all day while awake.  She states she has CPAP at home, which she has not been in the habit of using.  Pt educated on importance of using to reduce strain on body.

## 2022-12-24 NOTE — Progress Notes (Addendum)
[address]       [place] [address]             [phone]     1 Day Post-Op Procedure(s) (LRB): SUBXYPHOID PERICARDIAL WINDOW (N/A) TRANSESOPHAGEAL ECHOCARDIOGRAM (TEE) (N/A) Subjective: Some incisional discomfort  Objective: Vital signs in last 24 hours: Temp:  [97.3 F (36.3 C)-99.3 F (37.4 C)] 97.8 F (36.6 C) (01/14 0752) Pulse Rate:  [66-78] 74 (01/14 0752) Cardiac Rhythm: Normal sinus rhythm (01/14 0844) Resp:  [13-20] 13 (01/14 0752) BP: (104-134)/(40-77) 104/63 (01/14 0752) SpO2:  [88 %-100 %] 100 % (01/14 0752)  Hemodynamic parameters for last 24 hours:    Intake/Output from previous day: 01/13 0701 - 01/14 0700 In: 950 [I.V.:950] Out: 470 [Drains:420; Blood:50] Intake/Output this shift: No intake/output data recorded.  General appearance: alert, cooperative, and no distress Heart: regular rate and rhythm Lungs: clear anteriorly Wound: dressing CDI  Lab Results: Recent Labs    12/22/22 0121 12/24/22 0510  WBC 6.9 11.2*  HGB 10.3* 11.4*  HCT 33.8* 36.0  PLT 182 189   BMET:  Recent Labs    12/23/22 0052 12/24/22 0510  NA 138 139  K 4.1 4.7  CL 105 105  CO2 25 27  GLUCOSE 125* 197*  BUN 30* 38*  CREATININE 1.99* 2.17*  CALCIUM 8.1* 7.6*    PT/INR: No results for input(s): "LABPROT", "INR" in the last 72 hours. ABG    Component Value Date/Time   PHART 7.344 (L) 09/15/2019 1015   HCO3 33.9 (H) 12/08/2022 1110   TCO2 24 03/14/2018 1739   ACIDBASEDEF 7.6 (H) 09/15/2019 1015   O2SAT 33.6 12/08/2022 1110   CBG (last 3)  Recent Labs    12/23/22 1634 12/23/22 2045 12/24/22 0607  GLUCAP 198* 205* 187*    Meds Scheduled Meds:  atorvastatin  80 mg Oral QPM   brimonidine  1 drop Both Eyes BID   And   timolol  1 drop Both Eyes BID   calcium carbonate  4 tablet Oral QPM   Chlorhexidine Gluconate Cloth  6 each Topical Daily   clopidogrel  75 mg Oral Daily   heparin  5,000 Units Subcutaneous Q8H   insulin  aspart  0-15 Units Subcutaneous TID WC   insulin aspart  0-5 Units Subcutaneous QHS   insulin aspart  10 Units Subcutaneous TID WC   levothyroxine  200 mcg Oral QAC breakfast   levothyroxine  50 mcg Oral QAC breakfast   loratadine  10 mg Oral Daily   senna-docusate  2 tablet Oral BID   sodium chloride flush  3 mL Intravenous Q12H   Continuous Infusions:  sodium chloride 10 mL/hr at 12/22/22 1313   PRN Meds:.acetaminophen, albuterol, diclofenac Sodium, insulin glargine-yfgn, morphine injection, ondansetron (ZOFRAN) IV, oxyCODONE, traMADol  Xrays CARDIAC CATHETERIZATION  Result Date: 12/22/2022 1.  Unsuccessful pericardiocentesis and pericardial drain placement due to very protuberant abdomen; echocardiography on site could not identify an apical area for needle placement. Recommendation: Consider pericardial window.   ECHOCARDIOGRAM LIMITED  Result Date: 12/22/2022    ECHOCARDIOGRAM LIMITED REPORT   Patient Name:   Maureen Jackson Date of Exam: 12/22/2022 Medical Rec #:  [id number]       Height:       66.0 in Accession #:    [id number]      Weight:       351.9 lb Date of Birth:  05/26/1956        BSA:  2.542 m Patient Age:    67 years        BP:           128/79 mmHg Patient Gender: F               HR:           66 bpm. Exam Location:  Inpatient Procedure: Limited Echo Indications:    Pericardial effusion I31.3  History:        Patient has prior history of Echocardiogram examinations, most                 recent 12/20/2022. CHF, CAD, Arrythmias:LBBB; Risk                 Factors:Hypertension and Sleep Apnea.  Sonographer:    Darlina Sicilian RDCS Referring Phys: [id number] [place]  1. Large pericardial effusion up to 4.8 cm anterior to RV (subcostal) and 2.8 cm posterior to LV. The RV appears small and underfilled. There is systolic collapse of the RA. The IVC is dilated. Overall, findings are suspicious for cardiac tamponade, but  clinical correlation is recommended. Large  pericardial effusion. The pericardial effusion is posterior and lateral to the left ventricle and anterior to the right ventricle.  2. Left ventricular ejection fraction, by estimation, is 30 to 35%. The left ventricle has moderately decreased function. Left ventricular endocardial border not optimally defined to evaluate regional wall motion.  3. Right ventricular systolic function is normal. The right ventricular size is normal.  4. The inferior vena cava is dilated in size with >50% respiratory variability, suggesting right atrial pressure of 8 mmHg. FINDINGS  Left Ventricle: Left ventricular ejection fraction, by estimation, is 30 to 35%. The left ventricle has moderately decreased function. Left ventricular endocardial border not optimally defined to evaluate regional wall motion. Right Ventricle: The right ventricular size is normal. Right ventricular systolic function is normal. Pericardium: Large pericardial effusion up to 4.8 cm anterior to RV (subcostal) and 2.8 cm posterior to LV. The RV appears small and underfilled. There is systolic collapse of the RA. The IVC is dilated. Overall, findings are suspicious for cardiac tamponade, but clinical correlation is recommended. A large pericardial effusion is present. The pericardial effusion is posterior and lateral to the left ventricle and anterior to the right ventricle. Venous: The inferior vena cava is dilated in size with greater than 50% respiratory variability, suggesting right atrial pressure of 8 mmHg. Eleonore Chiquito MD Electronically signed by Eleonore Chiquito MD Signature Date/Time: 12/22/2022/11:07:58 AM    Final     Assessment/Plan: S/P Procedure(s) (LRB): SUBXYPHOID PERICARDIAL WINDOW (N/A) TRANSESOPHAGEAL ECHOCARDIOGRAM (TEE) (N/A) POD #1 status post subxiphoid pericardial window  1 99.3, vital signs stable normal sinus rhythm 2 oxygen saturations are good on room air 3 chest drain 420 cc/24h-keep in place 4 creatinine 2.17, has CRI 5 mild  leukocytosis, white blood cell count 11.2, monitor clinically 6 H/H stable 7  chest x-ray appears to have some increased vascular congestion 8 medical management per cardiology and hospitalist 9 pulm hygiene and mobilize as able   LOS: 4 days    John Giovanni PA-C Pager [phone] 12/24/2022 Agree with above. Leave drain in for now

## 2022-12-24 NOTE — Progress Notes (Signed)
Rounding Note    Patient Name: Maureen Jackson Date of Encounter: 12/24/2022  [place] Cardiologist: Carlyle Dolly, MD   Subjective   Pt is s/p pericardial window; dyspnea improved following procedure; mild chest pressure  Inpatient Medications    Scheduled Meds:  atorvastatin  80 mg Oral QPM   brimonidine  1 drop Both Eyes BID   And   timolol  1 drop Both Eyes BID   calcium carbonate  4 tablet Oral QPM   Chlorhexidine Gluconate Cloth  6 each Topical Daily   clopidogrel  75 mg Oral Daily   heparin  5,000 Units Subcutaneous Q8H   insulin aspart  0-15 Units Subcutaneous TID WC   insulin aspart  0-5 Units Subcutaneous QHS   insulin aspart  10 Units Subcutaneous TID WC   levothyroxine  200 mcg Oral QAC breakfast   levothyroxine  50 mcg Oral QAC breakfast   loratadine  10 mg Oral Daily   senna-docusate  2 tablet Oral BID   sodium chloride flush  3 mL Intravenous Q12H   Continuous Infusions:  sodium chloride 10 mL/hr at 12/22/22 1313   PRN Meds: acetaminophen, albuterol, diclofenac Sodium, insulin glargine-yfgn, morphine injection, ondansetron (ZOFRAN) IV, oxyCODONE, traMADol   Vital Signs    Vitals:   12/23/22 1600 12/23/22 2017 12/24/22 0343 12/24/22 0752  BP: 119/68 134/77 127/60 104/63  Pulse: 78 76 74 74  Resp: '20 17 14 13  '$ Temp: 98.4 F (36.9 C) 99 F (37.2 C) 99.3 F (37.4 C) 97.8 F (36.6 C)  TempSrc: Oral Axillary Axillary Oral  SpO2: 100% 97% 95% 100%  Weight:      Height:        Intake/Output Summary (Last 24 hours) at 12/24/2022 0841 Last data filed at 12/24/2022 0600 Gross per 24 hour  Intake 950 ml  Output 470 ml  Net 480 ml       12/23/2022    7:11 AM 12/21/2022    9:30 PM 12/21/2022    5:26 AM  Last 3 Weights  Weight (lbs) 351 lb 13.7 oz 351 lb 13.7 oz 356 lb 0.7 oz  Weight (kg) 159.6 kg 159.6 kg 161.5 kg      Telemetry    Sinus with NSVT- Personally Reviewed   Physical Exam   GEN: NAD, WD, obese Neck: supple,  JVP difficult to assess Cardiac: RRR, no gallop Respiratory: CTA anteriorly; no wheeze GI: Soft, s/p pericardial window MS: No edema Neuro: Grossly intact Psych: Normal affect   Labs    High Sensitivity Troponin:   Recent Labs  Lab 12/08/22 1110 12/08/22 1255 12/20/22 0953 12/20/22 1142  TROPONINIHS '10 11 12 10      '$ Chemistry Recent Labs  Lab 12/21/22 0435 12/22/22 0121 12/23/22 0052 12/24/22 0510  NA 145 142 138 139  K 3.8 4.1 4.1 4.7  CL 109 107 105 105  CO2 '29 25 25 27  '$ GLUCOSE 126* 78 125* 197*  BUN 24* 28* 30* 38*  CREATININE 1.64* 2.14* 1.99* 2.17*  CALCIUM 8.3* 8.0* 8.1* 7.6*  MG 2.1 2.1  --   --   GFRNONAA 35* 25* 28* 25*  ANIONGAP '7 10 8 7    '$ Hematology Recent Labs  Lab 12/21/22 0435 12/22/22 0121 12/24/22 0510  WBC 5.5 6.9 11.2*  RBC 3.66* 3.67* 4.01  HGB 10.2* 10.3* 11.4*  HCT 34.1* 33.8* 36.0  MCV 93.2 92.1 89.8  MCH 27.9 28.1 28.4  MCHC 29.9* 30.5 31.7  RDW 15.6* 15.4  14.6  PLT 179 182 189    Thyroid  Recent Labs  Lab 12/20/22 0956  TSH 130.159*     BNP Recent Labs  Lab 12/20/22 0953  BNP 74.0      Radiology    CARDIAC CATHETERIZATION  Result Date: 12/22/2022 1.  Unsuccessful pericardiocentesis and pericardial drain placement due to very protuberant abdomen; echocardiography on site could not identify an apical area for needle placement. Recommendation: Consider pericardial window.   ECHOCARDIOGRAM LIMITED  Result Date: 12/22/2022    ECHOCARDIOGRAM LIMITED REPORT   Patient Name:   TALIANA MERSEREAU Date of Exam: 12/22/2022 Medical Rec #:  [id number]       Height:       66.0 in Accession #:    [id number]      Weight:       351.9 lb Date of Birth:  August 20, 1956        BSA:          2.542 m Patient Age:    67 years        BP:           128/79 mmHg Patient Gender: F               HR:           66 bpm. Exam Location:  Inpatient Procedure: Limited Echo Indications:    Pericardial effusion I31.3  History:        Patient has prior history  of Echocardiogram examinations, most                 recent 12/20/2022. CHF, CAD, Arrythmias:LBBB; Risk                 Factors:Hypertension and Sleep Apnea.  Sonographer:    Darlina Sicilian RDCS Referring Phys: [id number] [place]  1. Large pericardial effusion up to 4.8 cm anterior to RV (subcostal) and 2.8 cm posterior to LV. The RV appears small and underfilled. There is systolic collapse of the RA. The IVC is dilated. Overall, findings are suspicious for cardiac tamponade, but  clinical correlation is recommended. Large pericardial effusion. The pericardial effusion is posterior and lateral to the left ventricle and anterior to the right ventricle.  2. Left ventricular ejection fraction, by estimation, is 30 to 35%. The left ventricle has moderately decreased function. Left ventricular endocardial border not optimally defined to evaluate regional wall motion.  3. Right ventricular systolic function is normal. The right ventricular size is normal.  4. The inferior vena cava is dilated in size with >50% respiratory variability, suggesting right atrial pressure of 8 mmHg. FINDINGS  Left Ventricle: Left ventricular ejection fraction, by estimation, is 30 to 35%. The left ventricle has moderately decreased function. Left ventricular endocardial border not optimally defined to evaluate regional wall motion. Right Ventricle: The right ventricular size is normal. Right ventricular systolic function is normal. Pericardium: Large pericardial effusion up to 4.8 cm anterior to RV (subcostal) and 2.8 cm posterior to LV. The RV appears small and underfilled. There is systolic collapse of the RA. The IVC is dilated. Overall, findings are suspicious for cardiac tamponade, but clinical correlation is recommended. A large pericardial effusion is present. The pericardial effusion is posterior and lateral to the left ventricle and anterior to the right ventricle. Venous: The inferior vena cava is dilated in size with  greater than 50% respiratory variability, suggesting right atrial pressure of 8 mmHg. Eleonore Chiquito MD Electronically signed by Eleonore Chiquito MD Signature  Date/Time: 12/22/2022/11:07:58 AM    Final      Patient Profile     67 y.o. female with past medical history of coronary artery disease, ischemic cardiomyopathy, pericardial effusion, diabetes mellitus, peripheral vascular disease, obstructive sleep apnea, hypertension transferred from endocrine for management of pericardial effusion.  Patient was admitted with dyspnea and given Lasix with subsequent hypotension.  She is felt to potentially be in tamponade and was transferred to [facility] for further management.  Echocardiogram performed January 10 showed ejection fraction 30 to 35% and large pericardial effusion more prominent in the posterior lateral distribution.  Chest CT showed no pulmonary embolus but there was note of a large pericardial effusion.  Assessment & Plan    1 pericardial effusion-patient was admitted with worsening dyspnea over the preceding 2 weeks and lower extremity edema.  She was given Lasix and by report became hypotensive.  Her echocardiogram from January 10 was reviewed.  She had a large pericardial effusion with a significant amount in the posterior lateral distribution.  Attempt at pericardiocentesis unsuccessful. Now S/P pericardial window 1/13. Await pathology and cultures.  2 coronary artery disease-continue statin.  3 ischemic cardiomyopathy-BP prior to pericardial window borderline and GDMT (entresto, coreg, spironolactone) held; blood pressure remains soft and creatinine mildly increased.  Will hold meds another 24 hours and begin to resume as tolerated tomorrow as blood pressure and renal function allow.  4 acute on chronic stage IIIa kidney disease-continue to follow creatinine.  Hopefully this will slowly improve status post pericardial window (improved renal perfusion).  For questions or updates,  please contact [place] Please consult [email or web address] for contact info under        Signed, Kirk Ruths, MD  12/24/2022, 8:41 AM

## 2022-12-24 NOTE — Progress Notes (Signed)
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  She was admitted with acute systolic CHF exacerbation with LVEF 30-35% and noted pericardial effusion that is concerning for possible tamponade.  She was seen by cardiology and started on IV diuresis.  Diuresis and antihypertensives held on 1/11 due to softer blood pressure readings.  Patient seen by cardiology with recommendations to transfer to [facility] for cardiothoracic surgery evaluation for pericardial window, but cardiothoracic surgery advised bradycardia and symptoms his first, plan is to have cardiothoracic surgery and cardiology to discuss for next plan of action 1/12  Patient was seen by CT surgery and cardiology 1/12: Attempted pericardiocentesis but unsuccessful CT surgery consulted for pericardial window- 1/13    Subjective:  Seen and examined this morning Currently on room air afebrile overnight  Patient sisters at the bedside  Labs reviewed creatinine slightly up 2.17 stable hemoglobin  Assessment and Plan: Principal Problem:   Acute on chronic combined systolic and diastolic CHF (congestive heart failure) ([place]) Active Problems:   Hypothyroidism   Essential hypertension, benign   Morbid obesity/BMI > 55   DM type 2 causing vascular disease ([place])   Pericardial effusion   Coronary artery disease involving native coronary artery of native heart without angina pectoris   Ischemic cardiomyopathy   Cardiac tamponade  Acute on chronic systolic congestive heart failure Pericardial effusion with dyspnea worsening past 2 weeks: Transferred from [facility] for CT surgery evaluation, pericardiocentesis attempted   1/12 unsuccessful so underwent pericardial window with drain placement and biopsy 1/13 by CT surgery. Continue pain control, drain management per CT surgery> discussed this morning keeping drain at least for next 24.  Follow-up chest x-ray. Follow-up pathology and culture report.  Holding diuretics and cardiac meds until renal function BP stabilizes next 24 hours. Net IO Since Admission: -287 mL [12/24/22 0941]  Filed Weights   12/21/22 0526 12/21/22 2130 12/23/22 0711  Weight: (!) 161.5 kg (!) 159.6 kg (!) 159.6 kg     Ischemic cardiomyopathy/CAD-DES to LAD and RCA 2018/PAD Hyperretension Hyperlipidemia: BP is stable.Continue Lipitor, Plavix. Holding Entresto Aldactone Coreg and diuretics- defer to cardio.  AKI on stage IIIa CKD: Creatinine  hovering ~ 2. Monitor baseline seems to be around 1.2. Recent Labs    02/06/22 1837 02/22/22 0956 08/26/22 2027 12/08/22 1110 12/20/22 0953 12/21/22 0435 12/22/22 0121 12/23/22 0052 12/24/22 0510  BUN '19 20 20 '$ 32* 21 24* 28* 30* 38*  CREATININE 1.16* 1.22* 1.17* 1.71* 1.47* 1.64* 2.14* 1.99* 2.17*   Severe hypothyroidism TSH 130 continue levothyroxine as ordered,followed by endocrinology Dr. Dorris Fetch, remote history of thyroidectomy for benign goiter  Type 2 diabetes mellitus A1c 7.3 on Tresiba 50 units at nighttime NovoLog 10 units 3 times dail y at home. Cont NovoLog 7 units 3 times daily, start Semglee 10 units at bedtime, continue SSI. Cont holding ozempicm monitor and adjust Recent Labs  Lab 12/20/22 0953 12/20/22 1739 12/23/22 1004 12/23/22 1227 12/23/22 1634 12/23/22 2045 12/24/22 0607  GLUCAP  --    < > 137* 136* 198* 205* 187*  HGBA1C 7.2*  --   --   --   --   --   --    < > = values in this interval  not displayed.    Morbid Obesity:Patient's Body mass index is 56.82 kg/m. : Will benefit with PCP follow-up, weight loss  healthy lifestyle OSA patient noncompliant with CPAP.  DVT prophylaxis: heparin injection 5,000 Units Start:  12/20/22 1400 Code Status:   Code Status: Full Code Family Communication: plan of care discussed with patient at bedside. Patient status is: inpatient because of pericardial efffusion  Level of care: Telemetry Cardiac  Dispo: The patient is from: Home            Anticipated disposition: TBD Objective: Vitals last 24 hrs: Vitals:   12/23/22 1600 12/23/22 2017 12/24/22 0343 12/24/22 0752  BP: 119/68 134/77 127/60 104/63  Pulse: 78 76 74 74  Resp: '20 17 14 13  '$ Temp: 98.4 F (36.9 C) 99 F (37.2 C) 99.3 F (37.4 C) 97.8 F (36.6 C)  TempSrc: Oral Axillary Axillary Oral  SpO2: 100% 97% 95% 100%  Weight:      Height:       Weight change:   Physical Examination: General exam: AA, weak,older appearing HEENT:Oral mucosa moist, Ear/Nose WNL grossly, dentition normal. Respiratory system: bilaterally diminished BS,no use of accessory muscle Cardiovascular system: S1 & S2 +, regular rate. Gastrointestinal system: Abdomen soft, obese NT,ND,BS+ Nervous System:Alert, awake, moving extremities and grossly nonfocal Extremities: LE ankle edema neg, lower extremities warm Skin: No rashes,no icterus. MSK: obese muscle bulk,tone, power  Lower sternal area w/ Chest with surgical site Aquacel dressing present and drain in place  Medications reviewed:  Scheduled Meds:  atorvastatin  80 mg Oral QPM   brimonidine  1 drop Both Eyes BID   And   timolol  1 drop Both Eyes BID   calcium carbonate  4 tablet Oral QPM   Chlorhexidine Gluconate Cloth  6 each Topical Daily   clopidogrel  75 mg Oral Daily   heparin  5,000 Units Subcutaneous Q8H   insulin aspart  0-15 Units Subcutaneous TID WC   insulin aspart  0-5 Units Subcutaneous QHS   insulin aspart  10 Units Subcutaneous TID WC   levothyroxine  200 mcg Oral QAC breakfast   levothyroxine  50 mcg Oral QAC breakfast   loratadine  10 mg Oral Daily   senna-docusate  2 tablet Oral BID   sodium chloride flush  3 mL Intravenous Q12H   Continuous  Infusions:  sodium chloride 10 mL/hr at 12/22/22 1313   Diet Order             Diet heart healthy/carb modified Room service appropriate? Yes with Assist; Fluid consistency: Thin  Diet effective now                  Intake/Output Summary (Last 24 hours) at 12/24/2022 0939 Last data filed at 12/24/2022 0600 Gross per 24 hour  Intake 800 ml  Output 420 ml  Net 380 ml   Net IO Since Admission: -287 mL [12/24/22 0939]  Wt Readings from Last 3 Encounters:  12/23/22 (!) 159.6 kg  12/08/22 (!) 161.5 kg  07/26/22 (!) 161.5 kg     Unresulted Labs (From admission, onward)     Start     Ordered   12/24/22 0500  CBC  Daily,   R      12/23/22 1103   12/23/22 0853  Fungus Stain  RELEASE UPON ORDERING,   STAT       Comments: Specimen B: Phone [phone]         Previous Biopsy:  no Is the patient on airborne/droplet  precautions? No           Clinical History:  pericardial fluid Copy of Report to:  Dr. Lavonna Monarch Specimen Disposition: MICRO     12/23/22 0853   12/23/22 0853  Acid Fast Culture with reflexed sensitivities  RELEASE UPON ORDERING,   STAT       Comments: Specimen B: Phone [phone]         Previous Biopsy:  no Is the patient on airborne/droplet precautions? No           Clinical History:  pericardial fluid Copy of Report to:  Dr. Lavonna Monarch Specimen Disposition: MICRO     12/23/22 0853   12/23/22 0853  Acid Fast Smear (AFB)  RELEASE UPON ORDERING,   STAT       Comments: Specimen B: Phone [phone]         Previous Biopsy:  no Is the patient on airborne/droplet precautions? No           Clinical History:  pericardial fluid Copy of Report to:  Dr. Lavonna Monarch Specimen Disposition: MICRO     12/23/22 0853   12/22/22 1544  Protein, body fluid (other)  RELEASE UPON ORDERING,   TIMED       Comments: Specimen A: Pre-op diagnosis: chest pain    12/22/22 1544   12/22/22 1544  LD, Body Fluid (other)  RELEASE UPON ORDERING,   TIMED       Comments: Specimen A: Pre-op  diagnosis: chest pain    12/22/22 1544   12/22/22 1544  Body fluid cell count with differential  RELEASE UPON ORDERING,   TIMED       Comments: Specimen A: Pre-op diagnosis: chest pain    12/22/22 1544   12/21/22 9528  Basic metabolic panel  Daily,   R     Comments: As Scheduled for 5 days    12/20/22 1338          Data Reviewed: I have personally reviewed following labs and imaging studies CBC: Recent Labs  Lab 12/20/22 0953 12/21/22 0435 12/22/22 0121 12/24/22 0510  WBC 5.9 5.5 6.9 11.2*  NEUTROABS 3.2  --   --   --   HGB 11.1* 10.2* 10.3* 11.4*  HCT 36.5 34.1* 33.8* 36.0  MCV 91.5 93.2 92.1 89.8  PLT 204 179 182 413   Basic Metabolic Panel: Recent Labs  Lab 12/20/22 0953 12/21/22 0435 12/22/22 0121 12/23/22 0052 12/24/22 0510  NA 142 145 142 138 139  K 3.9 3.8 4.1 4.1 4.7  CL 107 109 107 105 105  CO2 '25 29 25 25 27  '$ GLUCOSE 151* 126* 78 125* 197*  BUN 21 24* 28* 30* 38*  CREATININE 1.47* 1.64* 2.14* 1.99* 2.17*  CALCIUM 8.5* 8.3* 8.0* 8.1* 7.6*  MG  --  2.1 2.1  --   --    Recent Labs  Lab 12/23/22 1004 12/23/22 1227 12/23/22 1634 12/23/22 2045 12/24/22 0607  GLUCAP 137* 136* 198* 205* 187*   Lipid Profile: No results for input(s): "CHOL", "HDL", "LDLCALC", "TRIG", "CHOLHDL", "LDLDIRECT" in the last 72 hours. Thyroid Function Tests: No results for input(s): "TSH", "T4TOTAL", "FREET4", "T3FREE", "THYROIDAB" in the last 72 hours.  Sepsis Labs: No results for input(s): "PROCALCITON", "LATICACIDVEN" in the last 168 hours.  Recent Results (from the past 240 hour(s))  Resp panel by RT-PCR (RSV, Flu A&B, Covid) Anterior Nasal Swab     Status: None   Collection Time: 12/20/22 10:11 AM   Specimen: Anterior Nasal Swab  Result Value  Ref Range Status   SARS Coronavirus 2 by RT PCR NEGATIVE NEGATIVE Final    Comment: (NOTE) SARS-CoV-2 target nucleic acids are NOT DETECTED.  The SARS-CoV-2 RNA is generally detectable in upper respiratory specimens  during the acute phase of infection. The lowest concentration of SARS-CoV-2 viral copies this assay can detect is 138 copies/mL. A negative result does not preclude SARS-Cov-2 infection and should not be used as the sole basis for treatment or other patient management decisions. A negative result may occur with  improper specimen collection/handling, submission of specimen other than nasopharyngeal swab, presence of viral mutation(s) within the areas targeted by this assay, and inadequate number of viral copies(<138 copies/mL). A negative result must be combined with clinical observations, patient history, and epidemiological information. The expected result is Negative.  Fact Sheet for Patients:  EntrepreneurPulse.com.au  Fact Sheet for Healthcare Providers:  IncredibleEmployment.be  This test is no t yet approved or cleared by the [place] FDA and  has been authorized for detection and/or diagnosis of SARS-CoV-2 by FDA under an Emergency Use Authorization (EUA). This EUA will remain  in effect (meaning this test can be used) for the duration of the COVID-19 declaration under Section 564(b)(1) of the Act, 21 U.S.C.section 360bbb-3(b)(1), unless the authorization is terminated  or revoked sooner.       Influenza A by PCR NEGATIVE NEGATIVE Final   Influenza B by PCR NEGATIVE NEGATIVE Final    Comment: (NOTE) The Xpert Xpress SARS-CoV-2/FLU/RSV plus assay is intended as an aid in the diagnosis of influenza from Nasopharyngeal swab specimens and should not be used as a sole basis for treatment. Nasal washings and aspirates are unacceptable for Xpert Xpress SARS-CoV-2/FLU/RSV testing.  Fact Sheet for Patients: EntrepreneurPulse.com.au  Fact Sheet for Healthcare Providers: IncredibleEmployment.be  This test is not yet approved or cleared by the [place] FDA and has been authorized for detection  and/or diagnosis of SARS-CoV-2 by FDA under an Emergency Use Authorization (EUA). This EUA will remain in effect (meaning this test can be used) for the duration of the COVID-19 declaration under Section 564(b)(1) of the Act, 21 U.S.C. section 360bbb-3(b)(1), unless the authorization is terminated or revoked.     Resp Syncytial Virus by PCR NEGATIVE NEGATIVE Final    Comment: (NOTE) Fact Sheet for Patients: EntrepreneurPulse.com.au  Fact Sheet for Healthcare Providers: IncredibleEmployment.be  This test is not yet approved or cleared by the [place] FDA and has been authorized for detection and/or diagnosis of SARS-CoV-2 by FDA under an Emergency Use Authorization (EUA). This EUA will remain in effect (meaning this test can be used) for the duration of the COVID-19 declaration under Section 564(b)(1) of the Act, 21 U.S.C. section 360bbb-3(b)(1), unless the authorization is terminated or revoked.  Performed at [facility], [address], [place], [place] [address]   Surgical pcr screen     Status: None   Collection Time: 12/23/22  7:12 AM   Specimen: Nasal Mucosa; Nasal Swab  Result Value Ref Range Status   MRSA, PCR NEGATIVE NEGATIVE Final   Staphylococcus aureus NEGATIVE NEGATIVE Final    Comment: (NOTE) The Xpert SA Assay (FDA approved for NASAL specimens in patients 53 years of age and older), is one component of a comprehensive surveillance program. It is not intended to diagnose infection nor to guide or monitor treatment. Performed at [facility], [place] [address] [place], [place] [address]   Aerobic/Anaerobic Culture w Gram Stain (surgical/deep wound)     Status: None (  Preliminary result)   Collection Time: 12/23/22  8:52 AM   Specimen: PATH Cytology Misc. fluid; Body Fluid  Result Value Ref Range Status   Specimen Description PERITONEAL  Final   Special Requests NONE  Final   Gram Stain NO ORGANISMS SEEN NO WBC  SEEN   Final   Culture   Final    NO GROWTH < 24 HOURS Performed at [facility], [address], [place], [place] [address]    Report Status PENDING  Incomplete    Antimicrobials: Anti-infectives (From admission, onward)    None      Culture/Microbiology    Component Value Date/Time   SDES PERITONEAL 12/23/2022 0852   SPECREQUEST NONE 12/23/2022 0852   CULT  12/23/2022 0852    NO GROWTH < 24 HOURS Performed at [facility] [address], [place], [place] [address]    REPTSTATUS PENDING 12/23/2022 9798   Other culture-see note  Radiology Studies: DG Chest 1 View  Result Date: 12/24/2022 CLINICAL DATA:  Pericardial effusion. EXAM: CHEST  1 VIEW COMPARISON:  12/20/2022 FINDINGS: Decreased heart size seen since previous study. Right jugular central venous catheter seen with tip overlying the distal SVC. Evidence of pneumothorax. Other catheter overlying the heart of unknown type but may represent a pericardial drain. Diffuse interstitial infiltrates are suspicious for pulmonary edema. Asymmetric atelectasis or consolidation is seen in the left lower lobe. IMPRESSION: Decreased heart size since prior study. Diffuse interstitial infiltrates, suspicious for pulmonary edema. Asymmetric atelectasis or consolidation in left lower lobe. Electronically Signed   By: Marlaine Hind M.D.   On: 12/24/2022 09:17   CARDIAC CATHETERIZATION  Result Date: 12/22/2022 1.  Unsuccessful pericardiocentesis and pericardial drain placement due to very protuberant abdomen; echocardiography on site could not identify an apical area for needle placement. Recommendation: Consider pericardial window.   ECHOCARDIOGRAM LIMITED  Result Date: 12/22/2022    ECHOCARDIOGRAM LIMITED REPORT   Patient Name:   DORMA ALTMAN Date of Exam: 12/22/2022 Medical Rec #:  [id number]       Height:       66.0 in Accession #:    [id number]      Weight:       351.9 lb Date of Birth:  December 28, 1955        BSA:          2.542 m  Patient Age:    42 years        BP:           128/79 mmHg Patient Gender: F               HR:           66 bpm. Exam Location:  Inpatient Procedure: Limited Echo Indications:    Pericardial effusion I31.3  History:        Patient has prior history of Echocardiogram examinations, most                 recent 12/20/2022. CHF, CAD, Arrythmias:LBBB; Risk                 Factors:Hypertension and Sleep Apnea.  Sonographer:    Darlina Sicilian RDCS Referring Phys: [id number] [place]  1. Large pericardial effusion up to 4.8 cm anterior to RV (subcostal) and 2.8 cm posterior to LV. The RV appears small and underfilled. There is systolic collapse of the RA. The IVC is dilated. Overall, findings are suspicious for cardiac tamponade, but  clinical correlation  is recommended. Large pericardial effusion. The pericardial effusion is posterior and lateral to the left ventricle and anterior to the right ventricle.  2. Left ventricular ejection fraction, by estimation, is 30 to 35%. The left ventricle has moderately decreased function. Left ventricular endocardial border not optimally defined to evaluate regional wall motion.  3. Right ventricular systolic function is normal. The right ventricular size is normal.  4. The inferior vena cava is dilated in size with >50% respiratory variability, suggesting right atrial pressure of 8 mmHg. FINDINGS  Left Ventricle: Left ventricular ejection fraction, by estimation, is 30 to 35%. The left ventricle has moderately decreased function. Left ventricular endocardial border not optimally defined to evaluate regional wall motion. Right Ventricle: The right ventricular size is normal. Right ventricular systolic function is normal. Pericardium: Large pericardial effusion up to 4.8 cm anterior to RV (subcostal) and 2.8 cm posterior to LV. The RV appears small and underfilled. There is systolic collapse of the RA. The IVC is dilated. Overall, findings are suspicious for cardiac  tamponade, but clinical correlation is recommended. A large pericardial effusion is present. The pericardial effusion is posterior and lateral to the left ventricle and anterior to the right ventricle. Venous: The inferior vena cava is dilated in size with greater than 50% respiratory variability, suggesting right atrial pressure of 8 mmHg. Eleonore Chiquito MD Electronically signed by Eleonore Chiquito MD Signature Date/Time: 12/22/2022/11:07:58 AM    Final      LOS: 4 days   Antonieta Pert, MD [facility]  12/24/2022, 9:39 AM

## 2022-12-24 NOTE — Progress Notes (Signed)
Rounding Note    Patient Name: Maureen Jackson Date of Encounter: 12/25/2022  [place] Cardiologist: Carlyle Dolly, MD   Subjective   Complains of pain at the surgical site. Breathing okay.  Cr stable 2.13 this AM  Inpatient Medications    Scheduled Meds:  atorvastatin  80 mg Oral QPM   brimonidine  1 drop Both Eyes BID   And   timolol  1 drop Both Eyes BID   calcium carbonate  4 tablet Oral QPM   Chlorhexidine Gluconate Cloth  6 each Topical Daily   clopidogrel  75 mg Oral Daily   heparin  5,000 Units Subcutaneous Q8H   insulin aspart  0-15 Units Subcutaneous TID WC   insulin aspart  0-5 Units Subcutaneous QHS   insulin aspart  7 Units Subcutaneous TID WC   insulin glargine-yfgn  10 Units Subcutaneous QHS   levothyroxine  200 mcg Oral QAC breakfast   levothyroxine  50 mcg Oral QAC breakfast   loratadine  10 mg Oral Daily   senna-docusate  2 tablet Oral BID   sodium chloride flush  3 mL Intravenous Q12H   Continuous Infusions:  sodium chloride 10 mL/hr at 12/22/22 1313   PRN Meds: acetaminophen, albuterol, diclofenac Sodium, morphine injection, ondansetron (ZOFRAN) IV, oxyCODONE, traMADol   Vital Signs    Vitals:   12/24/22 2332 12/25/22 0500 12/25/22 0512 12/25/22 0757  BP: 120/70 132/75  122/76  Pulse: 72 72  74  Resp: '20 16  16  '$ Temp: 98.1 F (36.7 C) 97.9 F (36.6 C)  97.9 F (36.6 C)  TempSrc: Oral Oral  Oral  SpO2: 100% 100%  100%  Weight:   (!) 206.4 kg   Height:        Intake/Output Summary (Last 24 hours) at 12/25/2022 0846 Last data filed at 12/25/2022 0600 Gross per 24 hour  Intake 240 ml  Output 1050 ml  Net -810 ml      12/25/2022    5:12 AM 12/23/2022    7:11 AM 12/21/2022    9:30 PM  Last 3 Weights  Weight (lbs) 455 lb 0.5 oz 351 lb 13.7 oz 351 lb 13.7 oz  Weight (kg) 206.4 kg 159.6 kg 159.6 kg      Telemetry    NSR - Personally Reviewed  ECG    No new tracing today - Personally Reviewed  Physical Exam    GEN: Morbidly obese, NAD  Neck: JVD difficult to assess due to body habitus Cardiac: RRR, no murmurs appreciated Respiratory: Diminished but clear anteriorly GI: Obese, soft, NTTP MS: No edema; No deformity. Neuro:  Nonfocal  Psych: Normal affect   Labs    High Sensitivity Troponin:   Recent Labs  Lab 12/08/22 1110 12/08/22 1255 12/20/22 0953 12/20/22 1142  TROPONINIHS '10 11 12 10     '$ Chemistry Recent Labs  Lab 12/21/22 0435 12/22/22 0121 12/23/22 0052 12/24/22 0510  NA 145 142 138 139  K 3.8 4.1 4.1 4.7  CL 109 107 105 105  CO2 '29 25 25 27  '$ GLUCOSE 126* 78 125* 197*  BUN 24* 28* 30* 38*  CREATININE 1.64* 2.14* 1.99* 2.17*  CALCIUM 8.3* 8.0* 8.1* 7.6*  MG 2.1 2.1  --   --   GFRNONAA 35* 25* 28* 25*  ANIONGAP '7 10 8 7    '$ Lipids No results for input(s): "CHOL", "TRIG", "HDL", "LABVLDL", "LDLCALC", "CHOLHDL" in the last 168 hours.  Hematology Recent Labs  Lab 12/22/22 0121 12/24/22 0510  12/25/22 0500  WBC 6.9 11.2* 8.8  RBC 3.67* 4.01 3.86*  HGB 10.3* 11.4* 10.7*  HCT 33.8* 36.0 35.5*  MCV 92.1 89.8 92.0  MCH 28.1 28.4 27.7  MCHC 30.5 31.7 30.1  RDW 15.4 14.6 15.0  PLT 182 189 188   Thyroid  Recent Labs  Lab 12/20/22 0956  TSH 130.159*    BNP Recent Labs  Lab 12/20/22 0953  BNP 74.0    DDimer No results for input(s): "DDIMER" in the last 168 hours.   Radiology    DG Chest 1 View  Result Date: 12/24/2022 CLINICAL DATA:  Pericardial effusion. EXAM: CHEST  1 VIEW COMPARISON:  12/20/2022 FINDINGS: Decreased heart size seen since previous study. Right jugular central venous catheter seen with tip overlying the distal SVC. Evidence of pneumothorax. Other catheter overlying the heart of unknown type but may represent a pericardial drain. Diffuse interstitial infiltrates are suspicious for pulmonary edema. Asymmetric atelectasis or consolidation is seen in the left lower lobe. IMPRESSION: Decreased heart size since prior study. Diffuse interstitial  infiltrates, suspicious for pulmonary edema. Asymmetric atelectasis or consolidation in left lower lobe. Electronically Signed   By: Marlaine Hind M.D.   On: 12/24/2022 09:17    Cardiac Studies   TTE 12/22/22: IMPRESSIONS     1. Large pericardial effusion up to 4.8 cm anterior to RV (subcostal) and  2.8 cm posterior to LV. The RV appears small and underfilled. There is  systolic collapse of the RA. The IVC is dilated. Overall, findings are  suspicious for cardiac tamponade, but   clinical correlation is recommended. Large pericardial effusion. The  pericardial effusion is posterior and lateral to the left ventricle and  anterior to the right ventricle.   2. Left ventricular ejection fraction, by estimation, is 30 to 35%. The  left ventricle has moderately decreased function. Left ventricular  endocardial border not optimally defined to evaluate regional wall motion.   3. Right ventricular systolic function is normal. The right ventricular  size is normal.   4. The inferior vena cava is dilated in size with >50% respiratory  variability, suggesting right atrial pressure of 8 mmHg.   Patient Profile     67 y.o. female with PMH of CAD, ischemic CM, DM, PAD, OSA, HTN who presented with large pericardial effusion with concern for tamponade physiology who failed pericardiocentesis now s/p pericardial window.  Assessment & Plan    #Large Pericardial Effusion with Tamponade Physiology: Patient presented with worsening dyspnea and LE edema. Given lasix and reportedly became hypotension. TTE 12/20/22 showed large pericardial effusion anterior to the RV with concern for tamponade physiology. Attempted pericardiocentesis but was not successful. She is now s/p pericardial window on 12/23/22. Doing well. -S/p pericardial window -Follow-up pathology and culture data  #Multivessel CAD: Patient with severe disease in LAD, Lcx and RCA on cath 09/2017. Seen by CT surgery and was recommended for PCI over  CABG. Received DES to LAD and DES to RCA x2. Lcx was managed medically. Was recommended for lifelong plavix per interventional cardiology.  -Continue plavix '75mg'$  daily -Continue lipitor '80mg'$  daily  #Chronic Systolic HF: #Ischemic CM: LVEF 30-35%. Volume status difficult to assess due to body habitus. GDMT held in the setting of tamponade and AKI. Will resume as able. -Resume GDMT as able pending renal function -Monitor I/Os and daily weights  #AKI on CKD IIIA: Baseline Cr around 1.2-1.4. Currently 2. Holding spiro, entresto, jardiance for now.  #Morbid Obesity: BMI 73.  For questions or updates, please contact [place] Please consult [email or web address] for contact info under        Signed, Freada Bergeron, MD  12/25/2022, 8:46 AM

## 2022-12-24 NOTE — Progress Notes (Signed)
Pt is refusing to wear cpap tonight.

## 2022-12-25 ENCOUNTER — Encounter (HOSPITAL_COMMUNITY): Payer: Self-pay | Admitting: Internal Medicine

## 2022-12-25 DIAGNOSIS — I3139 Other pericardial effusion (noninflammatory): Secondary | ICD-10-CM

## 2022-12-25 DIAGNOSIS — I5043 Acute on chronic combined systolic (congestive) and diastolic (congestive) heart failure: Secondary | ICD-10-CM | POA: Diagnosis not present

## 2022-12-25 DIAGNOSIS — I10 Essential (primary) hypertension: Secondary | ICD-10-CM

## 2022-12-25 DIAGNOSIS — I314 Cardiac tamponade: Secondary | ICD-10-CM

## 2022-12-25 DIAGNOSIS — I2510 Atherosclerotic heart disease of native coronary artery without angina pectoris: Secondary | ICD-10-CM | POA: Diagnosis not present

## 2022-12-25 LAB — CBC
HCT: 35.5 % — ABNORMAL LOW (ref 36.0–46.0)
Hemoglobin: 10.7 g/dL — ABNORMAL LOW (ref 12.0–15.0)
MCH: 27.7 pg (ref 26.0–34.0)
MCHC: 30.1 g/dL (ref 30.0–36.0)
MCV: 92 fL (ref 80.0–100.0)
Platelets: 188 10*3/uL (ref 150–400)
RBC: 3.86 MIL/uL — ABNORMAL LOW (ref 3.87–5.11)
RDW: 15 % (ref 11.5–15.5)
WBC: 8.8 10*3/uL (ref 4.0–10.5)
nRBC: 0 % (ref 0.0–0.2)

## 2022-12-25 LAB — BASIC METABOLIC PANEL
Anion gap: 5 (ref 5–15)
BUN: 45 mg/dL — ABNORMAL HIGH (ref 8–23)
CO2: 29 mmol/L (ref 22–32)
Calcium: 7.9 mg/dL — ABNORMAL LOW (ref 8.9–10.3)
Chloride: 105 mmol/L (ref 98–111)
Creatinine, Ser: 2.13 mg/dL — ABNORMAL HIGH (ref 0.44–1.00)
GFR, Estimated: 25 mL/min — ABNORMAL LOW (ref >60)
Glucose, Bld: 122 mg/dL — ABNORMAL HIGH (ref 70–99)
Potassium: 4.3 mmol/L (ref 3.5–5.1)
Sodium: 139 mmol/L (ref 135–145)

## 2022-12-25 LAB — GLUCOSE, CAPILLARY
Glucose-Capillary: 145 mg/dL — ABNORMAL HIGH (ref 70–99)
Glucose-Capillary: 121 mg/dL — ABNORMAL HIGH (ref 70–99)
Glucose-Capillary: 126 mg/dL — ABNORMAL HIGH (ref 70–99)
Glucose-Capillary: 135 mg/dL — ABNORMAL HIGH (ref 70–99)

## 2022-12-25 LAB — BRAIN NATRIURETIC PEPTIDE: B Natriuretic Peptide: 528.2 pg/mL — ABNORMAL HIGH (ref 0.0–100.0)

## 2022-12-25 NOTE — Progress Notes (Signed)
   Heart Failure Stewardship Pharmacist Progress Note   PCP: Neale Burly, MD PCP-Cardiologist: Carlyle Dolly, MD    HPI:  67 yo F with PMH of CHF, CAD, HTN, glaucoma, hypothyroidism, OSA, T2DM, obesity, and arthritis.   Known ischemic cardiomyopathy since at least 2018. EF at that time was 30-35% and multivessel CAD on cath. Received 2 stents to circumflex. EF has remained in the 30-40% range since then.   She presented to the ED on 1/10 with shortness of breath and LE edema. Denies orthopnea. CXR with no acute cardiopulmonary disease and stable cardiomegaly. CTA showed large pericardial effusion and trace bilateral pleural fluid. ECHO 1/12 showed LVEF 30-35%, RV normal, findings suspicious for cardiac tamponade. Transferred to [facility] for repeat ECHO and possible pericardiocentesis. Attempt at pericardiocentesis unsuccessful. Pericardial window by CT surgery done 1/13.  Current HF Medications: None  Prior to admission HF Medications: Diuretic: furosemide 40 mg daily Beta blocker: carvedilol 12.5 mg BID ACE/ARB/ARNI: Entresto 97/103 mg BID *not taking Jardiance or spironolactone   Pertinent Lab Values: Serum creatinine 2.13, BUN 45, Potassium 4.3, Sodium 139, BNP 74, Magnesium 2.1, A1c 7.2   Vital Signs: Weight: 351 lbs (admission weight: 352 lbs) Blood pressure: 120/80s  Heart rate: 60-70s  I/O: -0.6L yesterday; net -1.9L  Medication Assistance / Insurance Benefits Check: Does the patient have prescription insurance?  Yes Type of insurance plan: [facility] Medicare  Outpatient Pharmacy:  Prior to admission outpatient pharmacy: [facility] Is the patient willing to use [facility] at discharge? Yes Is the patient willing to transition their outpatient pharmacy to utilize a [facility] outpatient pharmacy?   Pending    Assessment: 1. Acute on chronic systolic CHF (LVEF 21-19%), due to ICM. NYHA class III symptoms. - Holding GDMT with low BP and AKI - Will likely need to  resume spironolactone and Jardiance on discharge   Plan: 1) Medication changes recommended at this time: - None pending' \\improvement'$  in renal function  2) Patient assistance: - None pending  3)  Education  - To be completed prior to discharge  Kerby Nora, PharmD, BCPS Heart Failure Stewardship Pharmacist Phone [phone]

## 2022-12-25 NOTE — Progress Notes (Addendum)
[address]       [place] [address]             [phone]      2 Days Post-Op Procedure(s) (LRB): SUBXYPHOID PERICARDIAL WINDOW (N/A) TRANSESOPHAGEAL ECHOCARDIOGRAM (TEE) (N/A)  Subjective:  Patient has some pain. Just got pain medication.  Feels like breathing better    Objective: Vital signs in last 24 hours: Temp:  [97.7 F (36.5 C)-98.1 F (36.7 C)] 97.9 F (36.6 C) (01/15 0757) Pulse Rate:  [72-76] 74 (01/15 0757) Cardiac Rhythm: Normal sinus rhythm (01/14 2105) Resp:  [16-20] 16 (01/15 0757) BP: (119-135)/(67-79) 122/76 (01/15 0757) SpO2:  [73 %-100 %] 100 % (01/15 0757) Weight:  [206.4 kg] 206.4 kg (01/15 0512)  Intake/Output from previous day: 01/14 0701 - 01/15 0700 In: 240 [P.O.:240] Out: 1050 [Urine:900; Drains:150]  General appearance: alert, cooperative, and no distress Heart: regular rate and rhythm Lungs: diminished breath sounds bibasilar Wound: clean and dry  Lab Results: Recent Labs    12/24/22 0510  WBC 11.2*  HGB 11.4*  HCT 36.0  PLT 189   BMET:  Recent Labs    12/23/22 0052 12/24/22 0510  NA 138 139  K 4.1 4.7  CL 105 105  CO2 25 27  GLUCOSE 125* 197*  BUN 30* 38*  CREATININE 1.99* 2.17*  CALCIUM 8.1* 7.6*    PT/INR: No results for input(s): "LABPROT", "INR" in the last 72 hours. ABG    Component Value Date/Time   PHART 7.344 (L) 09/15/2019 1015   HCO3 33.9 (H) 12/08/2022 1110   TCO2 24 03/14/2018 1739   ACIDBASEDEF 7.6 (H) 09/15/2019 1015   O2SAT 33.6 12/08/2022 1110   CBG (last 3)  Recent Labs    12/24/22 1641 12/24/22 2143 12/25/22 0459  GLUCAP 145* 122* 145*    Assessment/Plan: S/P Procedure(s) (LRB): SUBXYPHOID PERICARDIAL WINDOW (N/A) TRANSESOPHAGEAL ECHOCARDIOGRAM (TEE) (N/A)  S/P Sub Xiphoid Pericardial- 150 cc output, per Dr. Lavonna Monarch will d/c chest tube Dispo- will d/c chest tube and sign off.. will make patient outpatient wound check appointment   LOS: 5 days   Ellwood Handler, PA-C 12/25/2022  Agree with above

## 2022-12-25 NOTE — Plan of Care (Signed)
  Problem: Health Behavior/Discharge Planning: Goal: Ability to manage health-related needs will improve Outcome: Progressing   Problem: Clinical Measurements: Goal: Will remain free from infection Outcome: Progressing Goal: Cardiovascular complication will be avoided Outcome: Progressing   Problem: Activity: Goal: Risk for activity intolerance will decrease Outcome: Progressing   Problem: Elimination: Goal: Will not experience complications related to bowel motility Outcome: Progressing   Problem: Pain Managment: Goal: General experience of comfort will improve Outcome: Progressing

## 2022-12-25 NOTE — Progress Notes (Signed)
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  She was seen by cardiology and started on IV diuresis.   Diuresis and antihypertensives held on 1/11 due to softer blood pressure readings.  On  1/12, cardiology attempted attempted pericardiocentesis but unsuccessful.  CT surgery took patient on 1/13 for subxiphoid pericardial window with drainage of effusion as well as pericardial biopsy and placement of drain.  By 1/15, chest tube discontinued.    Subjective: Patient complains of some chest soreness  Assessment and Plan: Principal Problem:   Acute on chronic combined systolic and diastolic CHF (congestive heart failure) (HCC) Active Problems:   Hypothyroidism   Essential hypertension, benign   Morbid obesity/BMI > 55   DM type 2 causing vascular disease (HCC)   Pericardial effusion   Coronary artery disease involving native coronary artery of native heart without angina pectoris   Ischemic cardiomyopathy   Cardiac tamponade  Acute on chronic systolic congestive heart failure Pericardial effusion with dyspnea worsening past 2 weeks: Transferred from [facility] for CT surgery evaluation.  Status post unsuccessful pericardiocentesis.  Status post pericardial window with drain placement and biopsy.  Tube removed 1/15.  Repeat BNP today notes significant elevation at 528.  Patient is currently -2 L deficient.    Net IO Since Admission: -1,947 mL [12/25/22 1541]  Filed Weights   12/21/22 2130 12/23/22 0711 12/25/22 0512  Weight: (!) 159.6 kg (!) 159.6 kg (!) 206.4 kg     Ischemic  cardiomyopathy/CAD-DES to LAD and RCA 2018/PAD Hyperretension Hyperlipidemia: BP is stable.Continue Lipitor, Plavix. Holding Entresto Aldactone Coreg and diuretics- defer to cardio.  AKI on stage IIIa CKD: Creatinine around 2.13. Recent Labs    02/06/22 1837 02/22/22 0956 08/26/22 2027 12/08/22 1110 12/20/22 0953 12/21/22 0435 12/22/22 0121 12/23/22 0052 12/24/22 0510 12/25/22 0500  BUN '19 20 20 '$ 32* 21 24* 28* 30* 38* 45*  CREATININE 1.16* 1.22* 1.17* 1.71* 1.47* 1.64* 2.14* 1.99* 2.17* 2.13*    Severe hypothyroidism TSH 130 continue levothyroxine as ordered,followed by endocrinology Dr. Dorris Fetch, remote history of thyroidectomy for benign goiter  Type 2 diabetes mellitus A1c 7.3 on Tresiba 50 units at nighttime NovoLog 10 units 3 times dail y at home. Cont NovoLog 7 units 3 times daily and Semglee 10 units at bedtime, continue SSI. Cont holding ozempicm monitor and adjust Recent Labs  Lab 12/20/22 0953 12/20/22 1739 12/24/22 1105 12/24/22 1641 12/24/22 2143 12/25/22 0459 12/25/22 1121  GLUCAP  --    < > 143* 145* 122* 145* 121*  HGBA1C 7.2*  --   --   --   --   --   --    < > = values in this interval not displayed.     Morbid Obesity:Patient's Body mass index is 73.48 kg/m. : Will benefit with PCP follow-up, weight loss  healthy lifestyle OSA patient noncompliant with CPAP.  DVT prophylaxis: heparin injection 5,000 Units Start: 12/20/22 1400 Code Status:   Code Status: Full Code Family Communication: plan of care discussed with patient at bedside. Patient status is: inpatient -Needs further diuresis -Improvement in renal function  Level of care: Telemetry Cardiac  Dispo: The patient is from: Home  Anticipated disposition: To be determined.  Now that tube has been removed, will consult PT and OT to see Objective: Vitals last 24 hrs: Vitals:   12/25/22 0500 12/25/22 0512 12/25/22 0757 12/25/22 1114  BP: 132/75  122/76 129/82  Pulse: 72  74 75  Resp: '16   16 18  '$ Temp: 97.9 F (36.6 C)  97.9 F (36.6 C) 98.2 F (36.8 C)  TempSrc: Oral  Oral Oral  SpO2: 100%  100% 100%  Weight:  (!) 206.4 kg    Height:       Weight change: 46.8 kg  Physical Examination: General exam: Alert and oriented x 3, no acute distress HEENT: Normocephalic, atraumatic, mucous membranes are moist Respiratory system: Decreased breath sounds bibasilar Cardiovascular system: Regular rate and rhythm, S1-S2 Gastrointestinal system: Soft, nontender, nondistended, positive bowel sounds Nervous System: No focal deficits Extremities: 1-2+ pitting edema Skin: Chest wound bandaged, drain removed  Medications reviewed:  Scheduled Meds:  atorvastatin  80 mg Oral QPM   brimonidine  1 drop Both Eyes BID   And   timolol  1 drop Both Eyes BID   calcium carbonate  4 tablet Oral QPM   Chlorhexidine Gluconate Cloth  6 each Topical Daily   clopidogrel  75 mg Oral Daily   heparin  5,000 Units Subcutaneous Q8H   insulin aspart  0-15 Units Subcutaneous TID WC   insulin aspart  0-5 Units Subcutaneous QHS   insulin aspart  7 Units Subcutaneous TID WC   insulin glargine-yfgn  10 Units Subcutaneous QHS   levothyroxine  200 mcg Oral QAC breakfast   levothyroxine  50 mcg Oral QAC breakfast   loratadine  10 mg Oral Daily   senna-docusate  2 tablet Oral BID   sodium chloride flush  3 mL Intravenous Q12H   Continuous Infusions:  sodium chloride 10 mL/hr at 12/22/22 1313   Diet Order             Diet heart healthy/carb modified Room service appropriate? Yes with Assist; Fluid consistency: Thin  Diet effective now                  Intake/Output Summary (Last 24 hours) at 12/25/2022 1541 Last data filed at 12/25/2022 1118 Gross per 24 hour  Intake 240 ml  Output 1900 ml  Net -1660 ml    Net IO Since Admission: -1,947 mL [12/25/22 1541]  Wt Readings from Last 3 Encounters:  12/25/22 (!) 206.4 kg  12/08/22 (!) 161.5 kg  07/26/22 (!) 161.5 kg     Unresulted Labs  (From admission, onward)     Start     Ordered   12/24/22 0500  CBC  Daily,   R      12/23/22 1103   12/23/22 0853  Fungus Stain  RELEASE UPON ORDERING,   STAT       Comments: Specimen B: Phone [phone]         Previous Biopsy:  no Is the patient on airborne/droplet precautions? No           Clinical History:  pericardial fluid Copy of Report to:  Dr. Lavonna Monarch Specimen Disposition: MICRO     12/23/22 0853   12/23/22 0853  Acid Fast Culture with reflexed sensitivities  RELEASE UPON ORDERING,   STAT       Comments: Specimen B: Phone [phone]         Previous Biopsy:  no Is the patient on airborne/droplet precautions? No  Clinical History:  pericardial fluid Copy of Report to:  Dr. Lavonna Monarch Specimen Disposition: MICRO     12/23/22 0853   12/23/22 0853  Acid Fast Smear (AFB)  RELEASE UPON ORDERING,   STAT       Comments: Specimen B: Phone [phone]         Previous Biopsy:  no Is the patient on airborne/droplet precautions? No           Clinical History:  pericardial fluid Copy of Report to:  Dr. Lavonna Monarch Specimen Disposition: MICRO     12/23/22 0853   12/22/22 1544  Protein, body fluid (other)  RELEASE UPON ORDERING,   TIMED       Comments: Specimen A: Pre-op diagnosis: chest pain    12/22/22 1544   12/22/22 1544  LD, Body Fluid (other)  RELEASE UPON ORDERING,   TIMED       Comments: Specimen A: Pre-op diagnosis: chest pain    12/22/22 1544   12/22/22 1544  Body fluid cell count with differential  RELEASE UPON ORDERING,   TIMED       Comments: Specimen A: Pre-op diagnosis: chest pain    12/22/22 1544          Data Reviewed: I have personally reviewed following labs and imaging studies CBC: Recent Labs  Lab 12/20/22 0953 12/21/22 0435 12/22/22 0121 12/24/22 0510 12/25/22 0500  WBC 5.9 5.5 6.9 11.2* 8.8  NEUTROABS 3.2  --   --   --   --   HGB 11.1* 10.2* 10.3* 11.4* 10.7*  HCT 36.5 34.1* 33.8* 36.0 35.5*  MCV 91.5 93.2 92.1 89.8 92.0  PLT 204  179 182 189 408    Basic Metabolic Panel: Recent Labs  Lab 12/21/22 0435 12/22/22 0121 12/23/22 0052 12/24/22 0510 12/25/22 0500  NA 145 142 138 139 139  K 3.8 4.1 4.1 4.7 4.3  CL 109 107 105 105 105  CO2 '29 25 25 27 29  '$ GLUCOSE 126* 78 125* 197* 122*  BUN 24* 28* 30* 38* 45*  CREATININE 1.64* 2.14* 1.99* 2.17* 2.13*  CALCIUM 8.3* 8.0* 8.1* 7.6* 7.9*  MG 2.1 2.1  --   --   --     Recent Labs  Lab 12/24/22 1105 12/24/22 1641 12/24/22 2143 12/25/22 0459 12/25/22 1121  GLUCAP 143* 145* 122* 145* 121*    Lipid Profile: No results for input(s): "CHOL", "HDL", "LDLCALC", "TRIG", "CHOLHDL", "LDLDIRECT" in the last 72 hours. Thyroid Function Tests: No results for input(s): "TSH", "T4TOTAL", "FREET4", "T3FREE", "THYROIDAB" in the last 72 hours.  Sepsis Labs: No results for input(s): "PROCALCITON", "LATICACIDVEN" in the last 168 hours.  Recent Results (from the past 240 hour(s))  Resp panel by RT-PCR (RSV, Flu A&B, Covid) Anterior Nasal Swab     Status: None   Collection Time: 12/20/22 10:11 AM   Specimen: Anterior Nasal Swab  Result Value Ref Range Status   SARS Coronavirus 2 by RT PCR NEGATIVE NEGATIVE Final    Comment: (NOTE) SARS-CoV-2 target nucleic acids are NOT DETECTED.  The SARS-CoV-2 RNA is generally detectable in upper respiratory specimens during the acute phase of infection. The lowest concentration of SARS-CoV-2 viral copies this assay can detect is 138 copies/mL. A negative result does not preclude SARS-Cov-2 infection and should not be used as the sole basis for treatment or other patient management decisions. A negative result may occur with  improper specimen collection/handling, submission of specimen other than nasopharyngeal swab, presence of viral mutation(s) within the areas targeted  by this assay, and inadequate number of viral copies(<138 copies/mL). A negative result must be combined with clinical observations, patient history, and  epidemiological information. The expected result is Negative.  Fact Sheet for Patients:  EntrepreneurPulse.com.au  Fact Sheet for Healthcare Providers:  IncredibleEmployment.be  This test is no t yet approved or cleared by the [place] FDA and  has been authorized for detection and/or diagnosis of SARS-CoV-2 by FDA under an Emergency Use Authorization (EUA). This EUA will remain  in effect (meaning this test can be used) for the duration of the COVID-19 declaration under Section 564(b)(1) of the Act, 21 U.S.C.section 360bbb-3(b)(1), unless the authorization is terminated  or revoked sooner.       Influenza A by PCR NEGATIVE NEGATIVE Final   Influenza B by PCR NEGATIVE NEGATIVE Final    Comment: (NOTE) The Xpert Xpress SARS-CoV-2/FLU/RSV plus assay is intended as an aid in the diagnosis of influenza from Nasopharyngeal swab specimens and should not be used as a sole basis for treatment. Nasal washings and aspirates are unacceptable for Xpert Xpress SARS-CoV-2/FLU/RSV testing.  Fact Sheet for Patients: EntrepreneurPulse.com.au  Fact Sheet for Healthcare Providers: IncredibleEmployment.be  This test is not yet approved or cleared by the [place] FDA and has been authorized for detection and/or diagnosis of SARS-CoV-2 by FDA under an Emergency Use Authorization (EUA). This EUA will remain in effect (meaning this test can be used) for the duration of the COVID-19 declaration under Section 564(b)(1) of the Act, 21 U.S.C. section 360bbb-3(b)(1), unless the authorization is terminated or revoked.     Resp Syncytial Virus by PCR NEGATIVE NEGATIVE Final    Comment: (NOTE) Fact Sheet for Patients: EntrepreneurPulse.com.au  Fact Sheet for Healthcare Providers: IncredibleEmployment.be  This test is not yet approved or cleared by the [place] FDA and has been  authorized for detection and/or diagnosis of SARS-CoV-2 by FDA under an Emergency Use Authorization (EUA). This EUA will remain in effect (meaning this test can be used) for the duration of the COVID-19 declaration under Section 564(b)(1) of the Act, 21 U.S.C. section 360bbb-3(b)(1), unless the authorization is terminated or revoked.  Performed at [facility], [address] [place], [place] [address]   Surgical pcr screen     Status: None   Collection Time: 12/23/22  7:12 AM   Specimen: Nasal Mucosa; Nasal Swab  Result Value Ref Range Status   MRSA, PCR NEGATIVE NEGATIVE Final   Staphylococcus aureus NEGATIVE NEGATIVE Final    Comment: (NOTE) The Xpert SA Assay (FDA approved for NASAL specimens in patients 27 years of age and older), is one component of a comprehensive surveillance program. It is not intended to diagnose infection nor to guide or monitor treatment. Performed at [facility], [place] [address] [place], [place] [address]   Aerobic/Anaerobic Culture w Gram Stain (surgical/deep wound)     Status: None (Preliminary result)   Collection Time: 12/23/22  8:52 AM   Specimen: PATH Cytology Misc. fluid; Body Fluid  Result Value Ref Range Status   Specimen Description PERITONEAL  Final   Special Requests NONE  Final   Gram Stain NO ORGANISMS SEEN NO WBC SEEN   Final   Culture   Final    NO GROWTH 2 DAYS NO ANAEROBES ISOLATED; CULTURE IN PROGRESS FOR 5 DAYS Performed at [facility], [address], [place], [place] [address]    Report Status PENDING  Incomplete    Antimicrobials: Anti-infectives (From admission, onward)  None      Culture/Microbiology    Component Value Date/Time   SDES PERITONEAL 12/23/2022 0852   SPECREQUEST NONE 12/23/2022 0852   CULT  12/23/2022 0852    NO GROWTH 2 DAYS NO ANAEROBES ISOLATED; CULTURE IN PROGRESS FOR 5 DAYS Performed at [facility], [place] [address] [place], [place] [address]    REPTSTATUS PENDING  12/23/2022 7096   Other culture-see note  Radiology Studies: DG Chest 1 View  Result Date: 12/24/2022 CLINICAL DATA:  Pericardial effusion. EXAM: CHEST  1 VIEW COMPARISON:  12/20/2022 FINDINGS: Decreased heart size seen since previous study. Right jugular central venous catheter seen with tip overlying the distal SVC. Evidence of pneumothorax. Other catheter overlying the heart of unknown type but may represent a pericardial drain. Diffuse interstitial infiltrates are suspicious for pulmonary edema. Asymmetric atelectasis or consolidation is seen in the left lower lobe. IMPRESSION: Decreased heart size since prior study. Diffuse interstitial infiltrates, suspicious for pulmonary edema. Asymmetric atelectasis or consolidation in left lower lobe. Electronically Signed   By: Marlaine Hind M.D.   On: 12/24/2022 09:17     LOS: 5 days   Annita Brod, MD [facility]  12/25/2022, 3:41 PM

## 2022-12-25 NOTE — Progress Notes (Signed)
   12/25/22 1300  Mobility  Activity Dangled on edge of bed  Level of Assistance Maximum assist, patient does 25-49% (+2)  Activity Response Tolerated fair  Mobility Referral Yes  $Mobility charge 1 Mobility   Mobility Specialist Progress Note  Pt was in bed and agreeable. Had c/o pain at incision sight. Returned to bed w/ all needs met and call bell in reach.  Lucious Groves Mobility Specialist  Please contact via SecureChat or Rehab office at [phone]

## 2022-12-26 DIAGNOSIS — I2510 Atherosclerotic heart disease of native coronary artery without angina pectoris: Secondary | ICD-10-CM | POA: Diagnosis not present

## 2022-12-26 DIAGNOSIS — I314 Cardiac tamponade: Secondary | ICD-10-CM | POA: Diagnosis not present

## 2022-12-26 DIAGNOSIS — I10 Essential (primary) hypertension: Secondary | ICD-10-CM | POA: Diagnosis not present

## 2022-12-26 DIAGNOSIS — I5043 Acute on chronic combined systolic (congestive) and diastolic (congestive) heart failure: Secondary | ICD-10-CM | POA: Diagnosis not present

## 2022-12-26 LAB — BASIC METABOLIC PANEL
Anion gap: 8 (ref 5–15)
BUN: 38 mg/dL — ABNORMAL HIGH (ref 8–23)
CO2: 29 mmol/L (ref 22–32)
Calcium: 8.7 mg/dL — ABNORMAL LOW (ref 8.9–10.3)
Chloride: 104 mmol/L (ref 98–111)
Creatinine, Ser: 1.59 mg/dL — ABNORMAL HIGH (ref 0.44–1.00)
GFR, Estimated: 36 mL/min — ABNORMAL LOW (ref >60)
Glucose, Bld: 144 mg/dL — ABNORMAL HIGH (ref 70–99)
Potassium: 4.5 mmol/L (ref 3.5–5.1)
Sodium: 141 mmol/L (ref 135–145)

## 2022-12-26 LAB — CBC
HCT: 37.9 % (ref 36.0–46.0)
Hemoglobin: 11.7 g/dL — ABNORMAL LOW (ref 12.0–15.0)
MCH: 27.9 pg (ref 26.0–34.0)
MCHC: 30.9 g/dL (ref 30.0–36.0)
MCV: 90.2 fL (ref 80.0–100.0)
Platelets: 193 10*3/uL (ref 150–400)
RBC: 4.2 MIL/uL (ref 3.87–5.11)
RDW: 14.9 % (ref 11.5–15.5)
WBC: 7.8 10*3/uL (ref 4.0–10.5)
nRBC: 0 % (ref 0.0–0.2)

## 2022-12-26 LAB — GLUCOSE, CAPILLARY
Glucose-Capillary: 154 mg/dL — ABNORMAL HIGH (ref 70–99)
Glucose-Capillary: 160 mg/dL — ABNORMAL HIGH (ref 70–99)
Glucose-Capillary: 108 mg/dL — ABNORMAL HIGH (ref 70–99)
Glucose-Capillary: 149 mg/dL — ABNORMAL HIGH (ref 70–99)

## 2022-12-26 LAB — CYTOLOGY - NON PAP

## 2022-12-26 LAB — SURGICAL PATHOLOGY

## 2022-12-26 MED ORDER — POLYETHYLENE GLYCOL 3350 17 G PO PACK
17.0000 g | PACK | Freq: Every day | ORAL | Status: DC
  Administered 2022-12-26 – 2023-01-02 (×4): 17 g via ORAL
  Filled 2022-12-26 (×5): qty 1

## 2022-12-26 MED ORDER — INSULIN ASPART 100 UNIT/ML IJ SOLN
3.0000 [IU] | Freq: Three times a day (TID) | INTRAMUSCULAR | Status: DC
  Administered 2022-12-26 – 2023-01-02 (×17): 3 [IU] via SUBCUTANEOUS

## 2022-12-26 MED ORDER — BISACODYL 10 MG RE SUPP: 10.0000 mg | Freq: Every day | RECTAL | Status: DC | PRN

## 2022-12-26 NOTE — Plan of Care (Signed)
  Problem: Health Behavior/Discharge Planning: Goal: Ability to manage health-related needs will improve Outcome: Progressing   Problem: Clinical Measurements: Goal: Cardiovascular complication will be avoided Outcome: Progressing   Problem: Activity: Goal: Risk for activity intolerance will decrease Outcome: Progressing   Problem: Elimination: Goal: Will not experience complications related to bowel motility Outcome: Progressing

## 2022-12-26 NOTE — NC FL2 (Signed)
[place] MEDICAID FL2 LEVEL OF CARE FORM     IDENTIFICATION  Patient Name: Maureen Jackson Birthdate: 06-30-1956 Sex: female Admission Date (Current Location): 12/20/2022  [facility] and [place] Number:  [employer] and Address:  [facility], [place] [address] [place], [place] [address]      Provider Number: [id number]  Attending Physician Name and Address:  Antonieta Pert, MD  Relative Name and Phone Number:       Current Level of Care: Hospital Recommended Level of Care: [place] Prior Approval Number:    Date Approved/Denied:   PASRR Number: [id number]  Discharge Plan: SNF    Current Diagnoses: Patient Active Problem List   Diagnosis Date Noted   Cardiac tamponade 12/21/2022   Acute on chronic combined systolic and diastolic CHF (congestive heart failure) ([place]) 12/20/2022   Acute on chronic combined systolic (congestive) and diastolic (congestive) heart failure ([place]) 05/18/2020   Acute respiratory disease due to COVID-19 virus 12/26/2019   Pressure injury of skin 09/21/2019   Drug rash    Abscess    Fistula    Fournier's gangrene in female [facility])    Chest pain 07/16/2018   Mixed hyperlipidemia 04/10/2018   TIA (transient ischemic attack) 03/14/2018   Vertigo 03/14/2018   Chronic combined systolic and diastolic CHF (congestive heart failure) (EF 30 to 35 %) 03/14/2018   Ischemic cardiomyopathy 11/06/2017   Coronary artery disease involving native coronary artery of native heart without angina pectoris    Pericardial effusion 09/07/2017   Acute combined systolic (congestive) and diastolic (congestive) heart failure ([place]) 09/06/2017   Cardiomegaly 09/05/2017   Morbid obesity/BMI > 55 03/04/2013   Labyrinthitis 03/04/2013   DM type 2 causing vascular disease ([place]) 03/04/2013   GASTRITIS 12/13/2006   HIATAL HERNIA, HX OF 12/13/2006   THYROIDECTOMY, HX OF 12/13/2006   Hypothyroidism 10/04/2006   TOBACCO ABUSE 10/04/2006    CARPAL TUNNEL SYNDROME 10/04/2006   Essential hypertension, benign 10/04/2006   GERD 10/04/2006   POSTMENOPAUSAL STATUS 10/04/2006   SKIN TAG 10/04/2006   KNEE PAIN, LEFT 10/04/2006   Sleep apnea 10/04/2006   LEG EDEMA 10/04/2006    Orientation RESPIRATION BLADDER Height & Weight     Self, Time, Situation, Place  Normal Continent Weight: (!) 392 lb 10.2 oz (178.1 kg) Height:  5' 5.98" (167.6 cm)  BEHAVIORAL SYMPTOMS/MOOD NEUROLOGICAL BOWEL NUTRITION STATUS      Continent Diet (please see discharge summary)  AMBULATORY STATUS COMMUNICATION OF NEEDS Skin     Verbally Surgical wounds (closed incision chest)                       Personal Care Assistance Level of Assistance  Bathing, Feeding, Dressing Bathing Assistance: Limited assistance Feeding assistance: Limited assistance Dressing Assistance: Limited assistance     Functional Limitations Info  Sight, Hearing, Speech Sight Info: Adequate Hearing Info: Adequate Speech Info: Adequate    SPECIAL CARE FACTORS FREQUENCY  PT (By licensed PT), OT (By licensed OT)     PT Frequency: 5x per week OT Frequency: 5x per week            Contractures Contractures Info: Present    Additional Factors Info  Code Status, Allergies Code Status Info: FULL Allergies Info: NKA           Current Medications (12/26/2022):  This is the current hospital active medication list Current Facility-Administered Medications  Medication Dose Route Frequency Provider Last Rate Last  Admin   0.9 %  sodium chloride infusion   Intravenous Continuous Gold, Wayne E, PA-C 10 mL/hr at 12/22/22 1313 New Bag at 12/22/22 1313   acetaminophen (TYLENOL) tablet 650 mg  650 mg Oral Q4H PRN Jadene Pierini E, PA-C   650 mg at 12/23/22 2329   albuterol (PROVENTIL) (2.5 MG/3ML) 0.083% nebulizer solution 3 mL  3 mL Inhalation Q6H PRN Gold, Wayne E, PA-C       atorvastatin (LIPITOR) tablet 80 mg  80 mg Oral QPM Gold, Wayne E, PA-C   80 mg at 12/25/22 1742    bisacodyl (DULCOLAX) suppository 10 mg  10 mg Rectal Daily PRN Kc, Maren Beach, MD       brimonidine (ALPHAGAN) 0.2 % ophthalmic solution 1 drop  1 drop Both Eyes BID Gold, Wayne E, PA-C   1 drop at 12/26/22 6767   And   timolol (TIMOPTIC) 0.5 % ophthalmic solution 1 drop  1 drop Both Eyes BID Gold, Wayne E, PA-C   1 drop at 12/26/22 2094   calcium carbonate (TUMS - dosed in mg elemental calcium) chewable tablet 800 mg of elemental calcium  4 tablet Oral QPM Gold, Wayne E, PA-C   800 mg of elemental calcium at 12/25/22 1742   Chlorhexidine Gluconate Cloth 2 % PADS 6 each  6 each Topical Daily Antonieta Pert, MD   6 each at 12/26/22 0928   clopidogrel (PLAVIX) tablet 75 mg  75 mg Oral Daily Gold, Wayne E, PA-C   75 mg at 12/26/22 7096   diclofenac Sodium (VOLTAREN) 1 % topical gel 2 g  2 g Topical Daily PRN Jadene Pierini E, PA-C       heparin injection 5,000 Units  5,000 Units Subcutaneous Q8H Gold, Wayne E, PA-C   5,000 Units at 12/26/22 1330   insulin aspart (novoLOG) injection 0-15 Units  0-15 Units Subcutaneous TID WC Gold, Wayne E, PA-C   3 Units at 12/26/22 1329   insulin aspart (novoLOG) injection 0-5 Units  0-5 Units Subcutaneous QHS Gold, Patrick [place] E, PA-C   2 Units at 12/23/22 2313   insulin aspart (novoLOG) injection 3 Units  3 Units Subcutaneous TID WC Kc, Maren Beach, MD   3 Units at 12/26/22 1328   insulin glargine-yfgn (SEMGLEE) injection 10 Units  10 Units Subcutaneous QHS Antonieta Pert, MD   10 Units at 12/25/22 2127   levothyroxine (SYNTHROID) tablet 200 mcg  200 mcg Oral QAC breakfast Jadene Pierini E, PA-C   200 mcg at 12/26/22 2836   levothyroxine (SYNTHROID) tablet 50 mcg  50 mcg Oral QAC breakfast Jadene Pierini E, PA-C   50 mcg at 12/26/22 0528   loratadine (CLARITIN) tablet 10 mg  10 mg Oral Daily Gold, Wayne E, PA-C   10 mg at 12/25/22 0805   morphine (PF) 2 MG/ML injection 2 mg  2 mg Intravenous Q2H PRN Kc, Maren Beach, MD   2 mg at 12/23/22 1203   ondansetron (ZOFRAN) injection 4 mg  4 mg Intravenous  Q6H PRN Gold, Wayne E, PA-C       oxyCODONE (Oxy IR/ROXICODONE) immediate release tablet 5-10 mg  5-10 mg Oral Q4H PRN Gold, Wayne E, PA-C   10 mg at 12/25/22 1207   polyethylene glycol (MIRALAX / GLYCOLAX) packet 17 g  17 g Oral Daily Kc, Ramesh, MD   17 g at 12/26/22 0944   senna-docusate (Senokot-S) tablet 2 tablet  2 tablet Oral BID Jadene Pierini E, PA-C   2 tablet at 12/26/22 [phone]  sodium chloride flush (NS) 0.9 % injection 3 mL  3 mL Intravenous Q12H Gold, Wayne E, PA-C   3 mL at 12/26/22 1833   traMADol (ULTRAM) tablet 50 mg  50 mg Oral Q6H PRN John Giovanni, PA-C         Discharge Medications: Please see discharge summary for a list of discharge medications.  Relevant Imaging Results:  Relevant Lab Results:   Additional Information SSN [id number]  Vinie Sill, LCSW

## 2022-12-26 NOTE — TOC Initial Note (Signed)
Transition of Care [facility]) - Initial/Assessment Note    Patient Details  Name: Maureen Jackson MRN: [id number] Date of Birth: [birth year]  Transition [facility] Care [facility]) CM/SW Contact:    Vinie Sill, LCSW Phone Number: 12/26/2022, 2:09 PM  Clinical Narrative:                  CSW spoke with patient's daughter,Cathy- she reports patient lives home alone but she lives close by. She and the patient is agreeable to short term rehab at [facility]. CSW explained the SNF process and possible placement barrier. Patient and family is agreeable to sending referral to the [place] area- they declined [place] area.  TOC will provide bed offers once available TOC will continue to follow and assist with discharge planning.   Thurmond Butts, MSW, LCSW Clinical Social Worker    Expected Discharge Plan: Skilled Nursing Facility Barriers to Discharge: Insurance Authorization, Continued Medical Work up, SNF Pending bed offer   Patient Goals and CMS Choice Patient states their goals for this hospitalization and ongoing recovery are:: return home CMS Medicare.gov Compare Post Acute Care list provided to:: Patient Choice offered to / list presented to : Patient      Expected Discharge Plan and Services In-house Referral: Clinical Social Work Discharge Planning Services: CM Consult Post Acute Care Choice: Durable Medical Equipment Living arrangements for the past 2 months: Apartment                                      Prior Living Arrangements/Services Living arrangements for the past 2 months: Apartment Lives with:: Self Patient language and need for interpreter reviewed:: No Do you feel safe going back to the place where you live?: Yes      Need for Family Participation in Patient Care: Yes (Comment) Care giver support system in place?: Yes (comment)   Criminal Activity/Legal Involvement Pertinent to Current Situation/Hospitalization: No - Comment as needed  Activities of  Daily Living Home Assistive Devices/Equipment: Eyeglasses ADL Screening (condition at time of admission) Patient's cognitive ability adequate to safely complete daily activities?: Yes Is the patient deaf or have difficulty hearing?: No Does the patient have difficulty seeing, even when wearing glasses/contacts?: No Does the patient have difficulty concentrating, remembering, or making decisions?: No Patient able to express need for assistance with ADLs?: Yes Does the patient have difficulty dressing or bathing?: No Independently performs ADLs?: Yes (appropriate for developmental age) Does the patient have difficulty walking or climbing stairs?: Yes Weakness of Legs: Both Weakness of Arms/Hands: None  Permission Sought/Granted      Share Information with NAME: Ahtziri Jeffries  Permission granted to share info w AGENCY: SNFs  Permission granted to share info w Relationship: daughter  Permission granted to share info w Contact Information: [phone]  Emotional Assessment Appearance:: Appears stated age Attitude/Demeanor/Rapport: Engaged Affect (typically observed): Accepting Orientation: : Oriented to Self, Oriented to Place, Oriented to  Time, Oriented to Situation Alcohol / Substance Use: Not Applicable Psych Involvement: No (comment)  Admission diagnosis:  Pericardial effusion [I31.39] Acute on chronic combined systolic and diastolic CHF (congestive heart failure) (HCC) [I50.43] Hypothyroidism, unspecified type [E03.9] Acute on chronic congestive heart failure, unspecified heart failure type ([place]) [I50.9] Patient Active Problem List   Diagnosis Date Noted   Cardiac tamponade 12/21/2022   Acute on chronic combined systolic and diastolic CHF (congestive heart failure) ([place]) 12/20/2022   Acute on chronic  combined systolic (congestive) and diastolic (congestive) heart failure (HCC) 05/18/2020   Acute respiratory disease due to COVID-19 virus 12/26/2019   Pressure injury of skin  09/21/2019   Drug rash    Abscess    Fistula    Fournier's gangrene in female [facility])    Chest pain 07/16/2018   Mixed hyperlipidemia 04/10/2018   TIA (transient ischemic attack) 03/14/2018   Vertigo 03/14/2018   Chronic combined systolic and diastolic CHF (congestive heart failure) (EF 30 to 35 %) 03/14/2018   Ischemic cardiomyopathy 11/06/2017   Coronary artery disease involving native coronary artery of native heart without angina pectoris    Pericardial effusion 09/07/2017   Acute combined systolic (congestive) and diastolic (congestive) heart failure ([place]) 09/06/2017   Cardiomegaly 09/05/2017   Morbid obesity/BMI > 55 03/04/2013   Labyrinthitis 03/04/2013   DM type 2 causing vascular disease ([place]) 03/04/2013   GASTRITIS 12/13/2006   HIATAL HERNIA, HX OF 12/13/2006   THYROIDECTOMY, HX OF 12/13/2006   Hypothyroidism 10/04/2006   TOBACCO ABUSE 10/04/2006   CARPAL TUNNEL SYNDROME 10/04/2006   Essential hypertension, benign 10/04/2006   GERD 10/04/2006   POSTMENOPAUSAL STATUS 10/04/2006   SKIN TAG 10/04/2006   KNEE PAIN, LEFT 10/04/2006   Sleep apnea 10/04/2006   LEG EDEMA 10/04/2006   PCP:  Neale Burly, MD Pharmacy:   [facility] [phone] - [place],  - [address] AT [place] [place] [address] Phone: [phone] Fax: [place] [address] [place] [address] Phone: [phone] Fax: [phone]     Social Determinants of Health (SDOH) Social History: SDOH Screenings   Food Insecurity: No Food Insecurity (12/26/2022)  Housing: Low Risk  (12/22/2022)  Transportation Needs: No Transportation Needs (12/22/2022)  Utilities: Not At Risk (12/26/2022)  Alcohol Screen: Low Risk  (12/22/2022)  Depression (PHQ2-9): Low Risk  (01/01/2020)  Financial Resource Strain: Low Risk  (12/22/2022)  Tobacco Use: Medium Risk (12/25/2022)   SDOH Interventions: Housing  Interventions: Intervention Not Indicated Transportation Interventions: Intervention Not Indicated Alcohol Usage Interventions: Intervention Not Indicated (Score <7) Financial Strain Interventions: Intervention Not Indicated   Readmission Risk Interventions    12/21/2022    9:38 AM 05/19/2020    8:46 AM  Readmission Risk Prevention Plan  Transportation Screening Complete Complete  PCP or Specialist Appt within 3-5 Days  Not Complete  Home Care Screening Complete   Medication Review (RN CM) Complete   HRI or Home Care Consult  Complete  Social Work Consult for [place] Planning/Counseling  Complete  Palliative Care Screening  Not Applicable  Medication Review Press photographer)  Complete

## 2022-12-26 NOTE — Progress Notes (Signed)
   Heart Failure Stewardship Pharmacist Progress Note   PCP: Neale Burly, MD PCP-Cardiologist: Carlyle Dolly, MD    HPI:  67 yo F with PMH of CHF, CAD, HTN, glaucoma, hypothyroidism, OSA, T2DM, obesity, and arthritis.   Known ischemic cardiomyopathy since at least 2018. EF at that time was 30-35% and multivessel CAD on cath. Received 2 stents to circumflex. EF has remained in the 30-40% range since then.   She presented to the ED on 1/10 with shortness of breath and LE edema. Denies orthopnea. CXR with no acute cardiopulmonary disease and stable cardiomegaly. CTA showed large pericardial effusion and trace bilateral pleural fluid. ECHO 1/12 showed LVEF 30-35%, RV normal, findings suspicious for cardiac tamponade. Transferred to [facility] for repeat ECHO and possible pericardiocentesis. Attempt at pericardiocentesis unsuccessful. Pericardial window by CT surgery done 1/13.  Current HF Medications: None  Prior to admission HF Medications: Diuretic: furosemide 40 mg daily Beta blocker: carvedilol 12.5 mg BID ACE/ARB/ARNI: Entresto 97/103 mg BID *not taking Jardiance or spironolactone   Pertinent Lab Values: Serum creatinine 1.59, BUN 38, Potassium 4.5, Sodium 141, BNP 74, Magnesium 2.1, A1c 7.2   Vital Signs: Weight: 392 lbs (admission weight: 352 lbs) Blood pressure: 150/80s  Heart rate: 70s  I/O: -0.6L yesterday; net -2.1L  Medication Assistance / Insurance Benefits Check: Does the patient have prescription insurance?  Yes Type of insurance plan: [facility] Medicare  Outpatient Pharmacy:  Prior to admission outpatient pharmacy: [facility] Is the patient willing to use [facility] at discharge? Yes Is the patient willing to transition their outpatient pharmacy to utilize a [facility] outpatient pharmacy?   Pending    Assessment: 1. Acute on chronic systolic CHF (LVEF 66-29%), due to ICM. NYHA class III symptoms. - Holding GDMT, BP and creatinine now improving  - Will likely  need to resume spironolactone and Jardiance on discharge   Plan: 1) Medication changes recommended at this time: - Start spironolactone and Jardiance 10 mg daily tomorrow if creatinine ok  2) Patient assistance: - None pending  3)  Education  - To be completed prior to discharge  Kerby Nora, PharmD, BCPS Heart Failure Cytogeneticist Phone [phone]

## 2022-12-26 NOTE — Progress Notes (Signed)
Rounding Note    Patient Name: Maureen Jackson Date of Encounter: 12/26/2022  Media Cardiologist: Carlyle Dolly, MD   Subjective   She feels better. Drain is removed.   Cr stable 2.13 1/15-> improving to 1.59  Inpatient Medications    Scheduled Meds:  atorvastatin  80 mg Oral QPM   brimonidine  1 drop Both Eyes BID   And   timolol  1 drop Both Eyes BID   calcium carbonate  4 tablet Oral QPM   Chlorhexidine Gluconate Cloth  6 each Topical Daily   clopidogrel  75 mg Oral Daily   heparin  5,000 Units Subcutaneous Q8H   insulin aspart  0-15 Units Subcutaneous TID WC   insulin aspart  0-5 Units Subcutaneous QHS   insulin aspart  3 Units Subcutaneous TID WC   insulin glargine-yfgn  10 Units Subcutaneous QHS   levothyroxine  200 mcg Oral QAC breakfast   levothyroxine  50 mcg Oral QAC breakfast   loratadine  10 mg Oral Daily   polyethylene glycol  17 g Oral Daily   senna-docusate  2 tablet Oral BID   sodium chloride flush  3 mL Intravenous Q12H   Continuous Infusions:  sodium chloride 10 mL/hr at 12/22/22 1313   PRN Meds: acetaminophen, albuterol, bisacodyl, diclofenac Sodium, morphine injection, ondansetron (ZOFRAN) IV, oxyCODONE, traMADol   Vital Signs    Vitals:   12/25/22 2310 12/26/22 0400 12/26/22 0750 12/26/22 0845  BP: 125/81 (!) 108/55 128/78 129/75  Pulse: 73 74 82 77  Resp: '15 15 18 18  '$ Temp: 97.8 F (36.6 C) 98.2 F (36.8 C) 98.1 F (36.7 C) 98.5 F (36.9 C)  TempSrc: Oral Oral Oral Oral  SpO2: 100% 100% 100% 100%  Weight:  (!) 178.1 kg    Height:        Intake/Output Summary (Last 24 hours) at 12/26/2022 1002 Last data filed at 12/26/2022 0539 Gross per 24 hour  Intake 740 ml  Output 1600 ml  Net -860 ml      12/26/2022    4:00 AM 12/25/2022    5:12 AM 12/23/2022    7:11 AM  Last 3 Weights  Weight (lbs) 392 lb 10.2 oz 455 lb 0.5 oz 351 lb 13.7 oz  Weight (kg) 178.1 kg 206.4 kg 159.6 kg      Telemetry    NSR -  Personally Reviewed  ECG    No new tracing today - Personally Reviewed  Physical Exam   GEN: Morbidly obese, NAD  Neck: JVD difficult to assess due to body habitus Cardiac: RRR, no murmurs appreciated Respiratory: Diminished but clear anteriorly GI: Obese, soft, NTTP MS: No edema; No deformity. Neuro:  Nonfocal  Psych: Normal affect   Labs    High Sensitivity Troponin:   Recent Labs  Lab 12/08/22 1110 12/08/22 1255 12/20/22 0953 12/20/22 1142  TROPONINIHS '10 11 12 10     '$ Chemistry Recent Labs  Lab 12/21/22 0435 12/22/22 0121 12/23/22 0052 12/24/22 0510 12/25/22 0500 12/26/22 0500  NA 145 142   < > 139 139 141  K 3.8 4.1   < > 4.7 4.3 4.5  CL 109 107   < > 105 105 104  CO2 29 25   < > '27 29 29  '$ GLUCOSE 126* 78   < > 197* 122* 144*  BUN 24* 28*   < > 38* 45* 38*  CREATININE 1.64* 2.14*   < > 2.17* 2.13* 1.59*  CALCIUM 8.3* 8.0*   < >  7.6* 7.9* 8.7*  MG 2.1 2.1  --   --   --   --   GFRNONAA 35* 25*   < > 25* 25* 36*  ANIONGAP 7 10   < > '7 5 8   '$ < > = values in this interval not displayed.    Lipids No results for input(s): "CHOL", "TRIG", "HDL", "LABVLDL", "LDLCALC", "CHOLHDL" in the last 168 hours.  Hematology Recent Labs  Lab 12/24/22 0510 12/25/22 0500 12/26/22 0500  WBC 11.2* 8.8 7.8  RBC 4.01 3.86* 4.20  HGB 11.4* 10.7* 11.7*  HCT 36.0 35.5* 37.9  MCV 89.8 92.0 90.2  MCH 28.4 27.7 27.9  MCHC 31.7 30.1 30.9  RDW 14.6 15.0 14.9  PLT 189 188 193   Thyroid  Recent Labs  Lab 12/20/22 0956  TSH 130.159*    BNP Recent Labs  Lab 12/20/22 0953 12/25/22 0500  BNP 74.0 528.2*    DDimer No results for input(s): "DDIMER" in the last 168 hours.   Radiology    No results found.  Cardiac Studies   TTE 12/22/22: IMPRESSIONS     1. Large pericardial effusion up to 4.8 cm anterior to RV (subcostal) and  2.8 cm posterior to LV. The RV appears small and underfilled. There is  systolic collapse of the RA. The IVC is dilated. Overall, findings  are  suspicious for cardiac tamponade, but   clinical correlation is recommended. Large pericardial effusion. The  pericardial effusion is posterior and lateral to the left ventricle and  anterior to the right ventricle.   2. Left ventricular ejection fraction, by estimation, is 30 to 35%. The  left ventricle has moderately decreased function. Left ventricular  endocardial border not optimally defined to evaluate regional wall motion.   3. Right ventricular systolic function is normal. The right ventricular  size is normal.   4. The inferior vena cava is dilated in size with >50% respiratory  variability, suggesting right atrial pressure of 8 mmHg.   Patient Profile     67 y.o. female with PMH of CAD, ischemic CM, DM, PAD, OSA, HTN who presented with large pericardial effusion with concern for tamponade physiology who failed pericardiocentesis now s/p pericardial window; improving  Assessment & Plan    #Large Pericardial Effusion with Tamponade Physiology: Patient presented with worsening dyspnea and LE edema. Given lasix and reportedly became hypotension. TTE 12/20/22 showed large pericardial effusion anterior to the RV with concern for tamponade physiology. Attempted pericardiocentesis but was not successful. She is now s/p pericardial window on 12/23/22. Doing well. -S/p pericardial window -Follow-up pathology and culture data; still pending. ANA negative in 2018  #Multivessel CAD: Patient with severe disease in LAD, Lcx and RCA on cath 09/2017. Seen by CT surgery and was recommended for PCI over CABG. Received DES to LAD and DES to RCA x2. Lcx was managed medically. Was recommended for lifelong plavix per interventional cardiology.  -Continue plavix '75mg'$  daily -Continue lipitor '80mg'$  daily  #Chronic Ischemic Systolic HF: #Ischemic CM: LVEF 30-35%. Volume status difficult to assess due to body habitus. GDMT held in the setting of tamponade and AKI. Will resume as able. -Resume  spironolactone and jardiance as able pending renal function; closer to discharge. Can likely resume tomorrow or Thur. if crt continues to trend in the right direction. Blood pressures are normal. -Monitor I/Os and daily weights  #AKI on CKD IIIA: Baseline Cr around 1.2-1.4. Was 2; now 1.59. Holding spiro, entresto, jardiance for now;   #Morbid Obesity: BMI  63.   Likely dispo by the end of the week. Will be important that she is up and out of bed  Time Spent Directly with Patient:  I have spent a total of 35 minutes with the patient reviewing hospital notes, telemetry, EKGs, labs and examining the patient as well as establishing an assessment and plan that was discussed personally with the patient.  > 50% of time was spent in direct patient care.      For questions or updates, please contact [place] Please consult [email or web address] for contact info under        Signed, Janina Mayo, MD  12/26/2022, 10:02 AM

## 2022-12-26 NOTE — Evaluation (Signed)
Physical Therapy Evaluation Patient Details Name: Maureen Jackson MRN: [id number] DOB: January 13, 1956 Today's Date: 12/26/2022  History of Present Illness  67 y.o.f w/ morbid obesity,CAD,HTN,severe hypothyroidism,T2DM, CKD stage IIIa, chronic systolic and diastolic CHF with LVEF 41%, and chronic pericardial effusion who was admitted on 1/10 with acute systolic CHF exacerbation with LVEF 30-35% and noted pericardial effusion that is concerning for possible tamponade.  She was seen by cardiology and started on IV diuresis.      Diuresis and antihypertensives held on 1/11 due to softer blood pressure readings.  On   1/12, cardiology attempted attempted pericardiocentesis but unsuccessful.  CT surgery took patient on 1/13 for subxiphoid pericardial window with drainage of effusion as well as pericardial biopsy and placement of drain.  By 1/15, chest tube discontinued.   Clinical Impression  Patient received in supine. She declines sitting up in bed due to that she sat up previously and got very sob. She is agreeable to rolling in bed and LE exercises. RR increased to 30s with minimal activity. O2 sats remained at 100% with exercises despite patient reports of SOB. She will continue to benefit from skilled PT to improve functional independence and strength.          Recommendations for follow up therapy are one component of a multi-disciplinary discharge planning process, led by the attending physician.  Recommendations may be updated based on patient status, additional functional criteria and insurance authorization.  Follow Up Recommendations Skilled nursing-short term rehab (<3 hours/day) Can patient physically be transported by private vehicle: No    Assistance Recommended at Discharge Frequent or constant Supervision/Assistance  Patient can return home with the following  A lot of help with walking and/or transfers;A lot of help with bathing/dressing/bathroom;Assist for transportation    Equipment  Recommendations Other (comment) (TBD)  Recommendations for Other Services       Functional Status Assessment Patient has had a recent decline in their functional status and demonstrates the ability to make significant improvements in function in a reasonable and predictable amount of time.     Precautions / Restrictions Precautions Precautions: Fall Restrictions Weight Bearing Restrictions: No      Mobility  Bed Mobility Overal bed mobility: Needs Assistance Bed Mobility: Rolling Rolling: Mod assist         General bed mobility comments: patient able to assist with rolling to her side for comfort/pressure relief.    Transfers                   General transfer comment: declines    Ambulation/Gait               General Gait Details: declined  Financial trader Rankin (Stroke Patients Only)       Balance                                             Pertinent Vitals/Pain Pain Assessment Pain Assessment: Faces Faces Pain Scale: Hurts a little bit Pain Location: bottom Pain Descriptors / Indicators: Discomfort, Sore Pain Intervention(s): Monitored during session, Repositioned    Home Living Family/patient expects to be discharged to:: Skilled nursing facility Living Arrangements: Alone Available Help at Discharge: Personal care attendant Type of Home: Apartment Home Access: Level entry  Home Layout: One level   Additional Comments: Has PCA that comes 3x per week and provides groceries, cleans, patient reports she does not get assist with ADLs.    Prior Function Prior Level of Function : Independent/Modified Independent             Mobility Comments: reports she was able to get up and walk in home, short distances prior to admission ADLs Comments: reports she was independent     Hand Dominance        Extremity/Trunk Assessment   Upper Extremity Assessment Upper  Extremity Assessment: Overall WFL for tasks assessed    Lower Extremity Assessment Lower Extremity Assessment: Generalized weakness       Communication   Communication: No difficulties  Cognition Arousal/Alertness: Awake/alert Behavior During Therapy: WFL for tasks assessed/performed Overall Cognitive Status: Within Functional Limits for tasks assessed                                          General Comments      Exercises Other Exercises Other Exercises: performed AP, heel slides and hip add/abd independently. Cues for pacing. Patient with increased RR with just bed exercises. O2 sats remained at 100% despite patient reporting SOB.   Assessment/Plan    PT Assessment Patient needs continued PT services  PT Problem List Decreased strength;Decreased activity tolerance;Decreased mobility;Obesity;Cardiopulmonary status limiting activity       PT Treatment Interventions DME instruction;Therapeutic exercise;Gait training;Balance training;Functional mobility training;Therapeutic activities;Patient/family education    PT Goals (Current goals can be found in the Care Plan section)  Acute Rehab PT Goals Patient Stated Goal: to get stronger PT Goal Formulation: With patient Time For Goal Achievement: 01/09/23 Potential to Achieve Goals: Fair    Frequency Min 2X/week     Co-evaluation               AM-PAC PT "6 Clicks" Mobility  Outcome Measure Help needed turning from your back to your side while in a flat bed without using bedrails?: A Lot Help needed moving from lying on your back to sitting on the side of a flat bed without using bedrails?: A Lot Help needed moving to and from a bed to a chair (including a wheelchair)?: Total Help needed standing up from a chair using your arms (e.g., wheelchair or bedside chair)?: Total Help needed to walk in hospital room?: Total Help needed climbing 3-5 steps with a railing? : Total 6 Click Score: 8    End of  Session   Activity Tolerance: Patient limited by fatigue;Other (comment) (SOB) Patient left: in bed;with call bell/phone within reach Nurse Communication: Mobility status PT Visit Diagnosis: Muscle weakness (generalized) (M62.81);Other abnormalities of gait and mobility (R26.89)    Time: 8592-9244 PT Time Calculation (min) (ACUTE ONLY): 11 min   Charges:   PT Evaluation $PT Eval Moderate Complexity: 1 Mod          Kiana Hollar, PT, GCS 12/26/22,12:09 PM

## 2022-12-26 NOTE — Progress Notes (Signed)
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  She was seen by cardiology and started on IV diuresis.   Diuresis and antihypertensives held on 1/11 due to softer blood pressure readings.  On  1/12, cardiology attempted attempted pericardiocentesis but unsuccessful.  CT surgery took patient on 1/13 for subxiphoid pericardial window with drainage of effusion as well as pericardial biopsy and placement of drain.  By 1/15, chest tube discontinued.    Subjective: Seen and examined this morning. Doing well on room air complains of constipation Overnight afebrile,BP 108-1 20s Labs reviewed creatinine improving 1.5 CBC stable  Assessment and Plan: Principal Problem:   Acute on chronic combined systolic and diastolic CHF (congestive heart failure) (HCC) Active Problems:   Hypothyroidism   Essential hypertension, benign   Morbid obesity/BMI > 55   DM type 2 causing vascular disease (HCC)   Pericardial effusion   Coronary artery disease involving native coronary artery of native heart without angina pectoris   Ischemic cardiomyopathy   Cardiac tamponade  Acute on chronic systolic congestive heart failure Large pericardial effusion with dyspnea worsening past 2 weeks> status post pericardial window 1/13: Transferred from [facility] for CT surgery evaluation, pericardiocentesis attempted  1/12 unsuccessful so underwent pericardial window with drain placement and biopsy 1/13 by CT surgery.  Drain has been discontinued 1/15 doing well on room air.  Hopefully can restart patient's diuretic regimen/GDMT by  tomorrow, being monitored and assessed by cardiology daily in the light of AKI  Net IO Since Admission: -1,717 mL [12/26/22 0833]  Filed Weights   12/23/22 0711 12/25/22 0512 12/26/22 0400  Weight: (!) 159.6 kg (!) 206.4 kg (!) 178.1 kg     Ischemic cardiomyopathy/CAD-DES to LAD and RCA 2018/PAD Hyperretension Hyperlipidemia: BP is soft/stable.Continue Lipitor, Plavix.  GDMT Entresto Aldactone Coreg and diuretics on hold initiate as BP and renal function allows per cardiology.  AKI on stage IIIa CKD: Creatinine improving to 1.5.  Diuretics were held.  Defer to cardiology. Recent Labs    02/22/22 0956 08/26/22 2027 12/08/22 1110 12/20/22 0953 12/21/22 0435 12/22/22 0121 12/23/22 0052 12/24/22 0510 12/25/22 0500 12/26/22 0500  BUN 20 20 32* 21 24* 28* 30* 38* 45* 38*  CREATININE 1.22* 1.17* 1.71* 1.47* 1.64* 2.14* 1.99* 2.17* 2.13* 1.59*    Severe hypothyroidism TSH 130 continue levothyroxine as ordered,followed by endocrinology Dr. Dorris Fetch, remote history of thyroidectomy for benign goiter  Type 2 diabetes mellitus A1c 7.3 on Tresiba 50 units at nighttime NovoLog 10 units 3 times daily at home.  Continue Semglee 10 units bedtime, decrease NovoLog to 3 units 3 times daily and SSI monitor and adjust Recent Labs  Lab 12/20/22 0953 12/20/22 1739 12/25/22 0459 12/25/22 1121 12/25/22 1723 12/25/22 2054 12/26/22 0622  GLUCAP  --    < > 145* 121* 126* 135* 154*  HGBA1C 7.2*  --   --   --   --   --   --    < > = values in this interval not displayed.     Morbid Obesity:Patient's Body mass index is 63.4 kg/m. : Will benefit with PCP follow-up, weight loss  healthy lifestyle OSA patient noncompliant with CPAP.  DVT  prophylaxis: heparin injection 5,000 Units Start: 12/20/22 1400 Code Status:   Code Status: Full Code Family Communication: plan of care discussed with patient at bedside. Patient status is: inpatient because of pericardial efffusion  Level of care: Telemetry Cardiac   Dispo: The patient is from: Home            Anticipated disposition: Likely by the end of the week.Requesting PT OT evaluation Objective: Vitals last 24 hrs: Vitals:   12/25/22 1946 12/25/22 2310 12/26/22 0400 12/26/22 0750  BP: 123/83 125/81 (!) 108/55 128/78  Pulse: 72 73 74 82  Resp: '12 15 15 18  '$ Temp: 97.9 F (36.6 C) 97.8 F (36.6 C) 98.2 F (36.8 C) 98.1 F (36.7 C)  TempSrc: Oral Oral Oral Oral  SpO2: 100% 100% 100% 100%  Weight:   (!) 178.1 kg   Height:       Weight change: -28.3 kg  Physical Examination: General exam: AAox3,obese weak,older appearing HEENT:Oral mucosa moist, Ear/Nose WNL grossly, dentition normal. Respiratory system: bilaterally diminished BS, no use of accessory muscle Cardiovascular system: S1 & S2 +, regular rate, the site of pericardial window is under dressing. Gastrointestinal system: Abdomen soft, obeseNT,ND,BS+ Nervous System:Alert, awake, moving extremities and grossly nonfocal Extremities: LE ankle edema +/lymphedema, lower extremities warm Skin: No rashes,no icterus. MSK:  Normal muscle bulk,tone, power   Medications reviewed:  Scheduled Meds:  atorvastatin  80 mg Oral QPM   brimonidine  1 drop Both Eyes BID   And   timolol  1 drop Both Eyes BID   calcium carbonate  4 tablet Oral QPM   Chlorhexidine Gluconate Cloth  6 each Topical Daily   clopidogrel  75 mg Oral Daily   heparin  5,000 Units Subcutaneous Q8H   insulin aspart  0-15 Units Subcutaneous TID WC   insulin aspart  0-5 Units Subcutaneous QHS   insulin aspart  7 Units Subcutaneous TID WC   insulin glargine-yfgn  10 Units Subcutaneous QHS   levothyroxine  200 mcg Oral QAC breakfast   levothyroxine  50 mcg Oral QAC breakfast   loratadine  10 mg Oral Daily   senna-docusate  2 tablet Oral BID   sodium chloride flush  3 mL Intravenous Q12H   Continuous Infusions:  sodium chloride 10 mL/hr at 12/22/22 1313   Diet Order             Diet heart healthy/carb modified Room  service appropriate? Yes with Assist; Fluid consistency: Thin  Diet effective now                  Intake/Output Summary (Last 24 hours) at 12/26/2022 0833 Last data filed at 12/26/2022 0539 Gross per 24 hour  Intake 740 ml  Output 1600 ml  Net -860 ml    Net IO Since Admission: -1,717 mL [12/26/22 0833]  Wt Readings from Last 3 Encounters:  12/26/22 (!) 178.1 kg  12/08/22 (!) 161.5 kg  07/26/22 (!) 161.5 kg     Unresulted Labs (From admission, onward)     Start     Ordered   12/26/22 0093  Basic metabolic panel  Daily,   R     Question:  Specimen collection method  Answer:  Unit=Unit collect   12/25/22 1551   12/24/22 0500  CBC  Daily,   R      12/23/22 1103   12/23/22 0853  Fungus Stain  RELEASE UPON ORDERING,   STAT       Comments: Specimen B: Phone [phone]  Previous Biopsy:  no Is the patient on airborne/droplet precautions? No           Clinical History:  pericardial fluid Copy of Report to:  Dr. Lavonna Monarch Specimen Disposition: MICRO     12/23/22 0853   12/23/22 0853  Acid Fast Culture with reflexed sensitivities  RELEASE UPON ORDERING,   STAT       Comments: Specimen B: Phone [phone]         Previous Biopsy:  no Is the patient on airborne/droplet precautions? No           Clinical History:  pericardial fluid Copy of Report to:  Dr. Lavonna Monarch Specimen Disposition: MICRO     12/23/22 0853   12/23/22 0853  Acid Fast Smear (AFB)  RELEASE UPON ORDERING,   STAT       Comments: Specimen B: Phone [phone]         Previous Biopsy:  no Is the patient on airborne/droplet precautions? No           Clinical History:  pericardial fluid Copy of Report to:  Dr. Lavonna Monarch Specimen Disposition: MICRO     12/23/22 0853   12/22/22 1544  Protein, body fluid (other)  RELEASE UPON ORDERING,   TIMED       Comments: Specimen A: Pre-op diagnosis: chest pain    12/22/22 1544   12/22/22 1544  LD, Body Fluid (other)  RELEASE UPON ORDERING,   TIMED       Comments:  Specimen A: Pre-op diagnosis: chest pain    12/22/22 1544   12/22/22 1544  Body fluid cell count with differential  RELEASE UPON ORDERING,   TIMED       Comments: Specimen A: Pre-op diagnosis: chest pain    12/22/22 1544          Data Reviewed: I have personally reviewed following labs and imaging studies CBC: Recent Labs  Lab 12/20/22 0953 12/21/22 0435 12/22/22 0121 12/24/22 0510 12/25/22 0500 12/26/22 0500  WBC 5.9 5.5 6.9 11.2* 8.8 7.8  NEUTROABS 3.2  --   --   --   --   --   HGB 11.1* 10.2* 10.3* 11.4* 10.7* 11.7*  HCT 36.5 34.1* 33.8* 36.0 35.5* 37.9  MCV 91.5 93.2 92.1 89.8 92.0 90.2  PLT 204 179 182 189 188 174    Basic Metabolic Panel: Recent Labs  Lab 12/21/22 0435 12/22/22 0121 12/23/22 0052 12/24/22 0510 12/25/22 0500 12/26/22 0500  NA 145 142 138 139 139 141  K 3.8 4.1 4.1 4.7 4.3 4.5  CL 109 107 105 105 105 104  CO2 '29 25 25 27 29 29  '$ GLUCOSE 126* 78 125* 197* 122* 144*  BUN 24* 28* 30* 38* 45* 38*  CREATININE 1.64* 2.14* 1.99* 2.17* 2.13* 1.59*  CALCIUM 8.3* 8.0* 8.1* 7.6* 7.9* 8.7*  MG 2.1 2.1  --   --   --   --     Recent Labs  Lab 12/25/22 0459 12/25/22 1121 12/25/22 1723 12/25/22 2054 12/26/22 0622  GLUCAP 145* 121* 126* 135* 154*    Lipid Profile: No results for input(s): "CHOL", "HDL", "LDLCALC", "TRIG", "CHOLHDL", "LDLDIRECT" in the last 72 hours. Thyroid Function Tests: No results for input(s): "TSH", "T4TOTAL", "FREET4", "T3FREE", "THYROIDAB" in the last 72 hours.  Sepsis Labs: No results for input(s): "PROCALCITON", "LATICACIDVEN" in the last 168 hours.  Recent Results (from the past 240 hour(s))  Resp panel by RT-PCR (RSV, Flu A&B, Covid) Anterior Nasal Swab     Status:  None   Collection Time: 12/20/22 10:11 AM   Specimen: Anterior Nasal Swab  Result Value Ref Range Status   SARS Coronavirus 2 by RT PCR NEGATIVE NEGATIVE Final    Comment: (NOTE) SARS-CoV-2 target nucleic acids are NOT DETECTED.  The SARS-CoV-2  RNA is generally detectable in upper respiratory specimens during the acute phase of infection. The lowest concentration of SARS-CoV-2 viral copies this assay can detect is 138 copies/mL. A negative result does not preclude SARS-Cov-2 infection and should not be used as the sole basis for treatment or other patient management decisions. A negative result may occur with  improper specimen collection/handling, submission of specimen other than nasopharyngeal swab, presence of viral mutation(s) within the areas targeted by this assay, and inadequate number of viral copies(<138 copies/mL). A negative result must be combined with clinical observations, patient history, and epidemiological information. The expected result is Negative.  Fact Sheet for Patients:  EntrepreneurPulse.com.au  Fact Sheet for Healthcare Providers:  IncredibleEmployment.be  This test is no t yet approved or cleared by the [place] FDA and  has been authorized for detection and/or diagnosis of SARS-CoV-2 by FDA under an Emergency Use Authorization (EUA). This EUA will remain  in effect (meaning this test can be used) for the duration of the COVID-19 declaration under Section 564(b)(1) of the Act, 21 U.S.C.section 360bbb-3(b)(1), unless the authorization is terminated  or revoked sooner.       Influenza A by PCR NEGATIVE NEGATIVE Final   Influenza B by PCR NEGATIVE NEGATIVE Final    Comment: (NOTE) The Xpert Xpress SARS-CoV-2/FLU/RSV plus assay is intended as an aid in the diagnosis of influenza from Nasopharyngeal swab specimens and should not be used as a sole basis for treatment. Nasal washings and aspirates are unacceptable for Xpert Xpress SARS-CoV-2/FLU/RSV testing.  Fact Sheet for Patients: EntrepreneurPulse.com.au  Fact Sheet for Healthcare Providers: IncredibleEmployment.be  This test is not yet approved or cleared by the  [place] FDA and has been authorized for detection and/or diagnosis of SARS-CoV-2 by FDA under an Emergency Use Authorization (EUA). This EUA will remain in effect (meaning this test can be used) for the duration of the COVID-19 declaration under Section 564(b)(1) of the Act, 21 U.S.C. section 360bbb-3(b)(1), unless the authorization is terminated or revoked.     Resp Syncytial Virus by PCR NEGATIVE NEGATIVE Final    Comment: (NOTE) Fact Sheet for Patients: EntrepreneurPulse.com.au  Fact Sheet for Healthcare Providers: IncredibleEmployment.be  This test is not yet approved or cleared by the [place] FDA and has been authorized for detection and/or diagnosis of SARS-CoV-2 by FDA under an Emergency Use Authorization (EUA). This EUA will remain in effect (meaning this test can be used) for the duration of the COVID-19 declaration under Section 564(b)(1) of the Act, 21 U.S.C. section 360bbb-3(b)(1), unless the authorization is terminated or revoked.  Performed at [facility], [address] [place], [place] [address]   Surgical pcr screen     Status: None   Collection Time: 12/23/22  7:12 AM   Specimen: Nasal Mucosa; Nasal Swab  Result Value Ref Range Status   MRSA, PCR NEGATIVE NEGATIVE Final   Staphylococcus aureus NEGATIVE NEGATIVE Final    Comment: (NOTE) The Xpert SA Assay (FDA approved for NASAL specimens in patients 57 years of age and older), is one component of a comprehensive surveillance program. It is not intended to diagnose infection nor to guide or monitor treatment. Performed at [facility], [place],  [place] [address]   Aerobic/Anaerobic Culture w Gram Stain (surgical/deep wound)     Status: None (Preliminary result)   Collection Time: 12/23/22  8:52 AM   Specimen: PATH Cytology Misc. fluid; Body Fluid  Result Value Ref Range Status   Specimen Description PERITONEAL  Final   Special Requests  NONE  Final   Gram Stain NO ORGANISMS SEEN NO WBC SEEN   Final   Culture   Final    NO GROWTH 2 DAYS NO ANAEROBES ISOLATED; CULTURE IN PROGRESS FOR 5 DAYS Performed at [facility], [address] [place], [place] [address]    Report Status PENDING  Incomplete    Antimicrobials: Anti-infectives (From admission, onward)    None      Culture/Microbiology    Component Value Date/Time   SDES PERITONEAL 12/23/2022 0852   SPECREQUEST NONE 12/23/2022 0852   CULT  12/23/2022 0852    NO GROWTH 2 DAYS NO ANAEROBES ISOLATED; CULTURE IN PROGRESS FOR 5 DAYS Performed at [facility] [address] [place], [place] [address]    REPTSTATUS PENDING 12/23/2022 4401   Other culture-see note  Radiology Studies: No results found.   LOS: 6 days   Antonieta Pert, MD [facility]  12/26/2022, 8:33 AM

## 2022-12-27 DIAGNOSIS — I5043 Acute on chronic combined systolic (congestive) and diastolic (congestive) heart failure: Secondary | ICD-10-CM | POA: Diagnosis not present

## 2022-12-27 LAB — GLUCOSE, CAPILLARY
Glucose-Capillary: 110 mg/dL — ABNORMAL HIGH (ref 70–99)
Glucose-Capillary: 145 mg/dL — ABNORMAL HIGH (ref 70–99)
Glucose-Capillary: 166 mg/dL — ABNORMAL HIGH (ref 70–99)
Glucose-Capillary: 152 mg/dL — ABNORMAL HIGH (ref 70–99)
Glucose-Capillary: 90 mg/dL (ref 70–99)

## 2022-12-27 LAB — BASIC METABOLIC PANEL
Anion gap: 7 (ref 5–15)
BUN: 30 mg/dL — ABNORMAL HIGH (ref 8–23)
CO2: 30 mmol/L (ref 22–32)
Calcium: 8.5 mg/dL — ABNORMAL LOW (ref 8.9–10.3)
Chloride: 104 mmol/L (ref 98–111)
Creatinine, Ser: 1.44 mg/dL — ABNORMAL HIGH (ref 0.44–1.00)
GFR, Estimated: 41 mL/min — ABNORMAL LOW (ref >60)
Glucose, Bld: 111 mg/dL — ABNORMAL HIGH (ref 70–99)
Potassium: 4.3 mmol/L (ref 3.5–5.1)
Sodium: 141 mmol/L (ref 135–145)

## 2022-12-27 LAB — CBC
HCT: 35.4 % — ABNORMAL LOW (ref 36.0–46.0)
Hemoglobin: 11.4 g/dL — ABNORMAL LOW (ref 12.0–15.0)
MCH: 28.2 pg (ref 26.0–34.0)
MCHC: 32.2 g/dL (ref 30.0–36.0)
MCV: 87.6 fL (ref 80.0–100.0)
Platelets: 177 10*3/uL (ref 150–400)
RBC: 4.04 MIL/uL (ref 3.87–5.11)
RDW: 14.8 % (ref 11.5–15.5)
WBC: 7.8 10*3/uL (ref 4.0–10.5)
nRBC: 0 % (ref 0.0–0.2)

## 2022-12-27 LAB — FUNGUS STAIN

## 2022-12-27 LAB — FUNGAL STAIN REFLEX

## 2022-12-27 MED ORDER — EMPAGLIFLOZIN 25 MG PO TABS
25.0000 mg | ORAL_TABLET | Freq: Every day | ORAL | Status: DC
  Administered 2022-12-27 – 2023-01-02 (×7): 25 mg via ORAL
  Filled 2022-12-27 (×7): qty 1

## 2022-12-27 MED ORDER — SPIRONOLACTONE 12.5 MG HALF TABLET
12.5000 mg | ORAL_TABLET | Freq: Every day | ORAL | Status: DC
  Administered 2022-12-27 – 2023-01-02 (×7): 12.5 mg via ORAL
  Filled 2022-12-27 (×7): qty 1

## 2022-12-27 MED ORDER — PANTOPRAZOLE SODIUM 40 MG PO TBEC
40.0000 mg | DELAYED_RELEASE_TABLET | Freq: Every day | ORAL | Status: DC
  Administered 2022-12-27 – 2023-01-02 (×7): 40 mg via ORAL
  Filled 2022-12-27 (×7): qty 1

## 2022-12-27 MED ORDER — SACUBITRIL-VALSARTAN 97-103 MG PO TABS: 1.0000 | ORAL_TABLET | Freq: Two times a day (BID) | ORAL | Status: DC

## 2022-12-27 MED ORDER — ALUM & MAG HYDROXIDE-SIMETH 200-200-20 MG/5ML PO SUSP
30.0000 mL | Freq: Four times a day (QID) | ORAL | Status: DC | PRN
  Filled 2022-12-27: qty 30

## 2022-12-27 NOTE — Plan of Care (Signed)
Her crt is closer to baseline. Restarting her home spironolactone and jardiance. If crt stable tomorrow, can restart home entresto. Can review Megan Boothby's recommendations. No other changes for today.

## 2022-12-27 NOTE — Progress Notes (Addendum)
Physical Therapy Treatment Patient Details Name: Maureen Jackson MRN: [id number] DOB: 1956/03/13 Today's Date: 12/27/2022   History of Present Illness 67 y.o.female who was admitted on 1/10 with acute systolic CHF exacerbation with LVEF 30-35% and noted pericardial effusion that is concerning for possible tamponade.  On 1/12, cardiology attempted pericardiocentesis but unsuccessful.  CT surgery took patient on 1/13 for subxiphoid pericardial window with drainage of effusion as well as pericardial biopsy and placement of drain.  By 1/15, chest tube discontinued. PMH: morbid obesity,CAD,HTN,severe hypothyroidism,T2DM, CKD stage IIIa, chronic systolic and diastolic CHF with LVEF 97%, and chronic pericardial effusion    PT Comments    Pt needed encouragement to participate initially, but then became more agreeable to progressing OOB mobility as the session progressed. Pt was able to transfer to stand and ambulate x2 bouts within the room at a minA level using a RW for support. She displays deficits in strength, power, balance, and endurance that place her at risk for falls. Encouraged mobilizing at least 3x/day and sitting up in the chair rather than staying in the bed to reduce deconditioning and promote progress towards going home. Educated pt on use of incentive spirometer. Will continue to follow acutely. Current recommendations remain appropriate.      Recommendations for follow up therapy are one component of a multi-disciplinary discharge planning process, led by the attending physician.  Recommendations may be updated based on patient status, additional functional criteria and insurance authorization.  Follow Up Recommendations  Skilled nursing-short term rehab (<3 hours/day) Can patient physically be transported by private vehicle: Yes   Assistance Recommended at Discharge Frequent or constant Supervision/Assistance  Patient can return home with the following A lot of help with  bathing/dressing/bathroom;Assist for transportation;A little help with walking and/or transfers;Assistance with cooking/housework   Equipment Recommendations  Rolling walker (2 wheels);BSC/3in1;Hospital bed (bariatric)    Recommendations for Other Services OT consult     Precautions / Restrictions Precautions Precautions: Fall Restrictions Weight Bearing Restrictions: No     Mobility  Bed Mobility Overal bed mobility: Needs Assistance Bed Mobility: Supine to Sit     Supine to sit: Min assist, HOB elevated     General bed mobility comments: MinA to ascend trunk and bring legs completely off EOB to sit up L EOB, using bed rails.    Transfers Overall transfer level: Needs assistance Equipment used: Rolling walker (2 wheels) Transfers: Sit to/from Stand Sit to Stand: Min assist           General transfer comment: Pt coming to stand 2x from EOB to RW, needing cues for hand placement and minA to power up to stand and for stability.    Ambulation/Gait Ambulation/Gait assistance: Min assist Gait Distance (Feet): 20 Feet (x2 bouts of ~16 ft > ~20 ft) Assistive device: Rolling walker (2 wheels) Gait Pattern/deviations: Step-through pattern, Decreased stride length, Trunk flexed Gait velocity: reduced Gait velocity interpretation: <1.31 ft/sec, indicative of household ambulator   General Gait Details: Pt with slow gait within room, needing up to minA for stability and safety. No LOB.   Stairs             Wheelchair Mobility    Modified Rankin (Stroke Patients Only)       Balance Overall balance assessment: Needs assistance Sitting-balance support: No upper extremity supported, Feet supported Sitting balance-Leahy Scale: Fair Sitting balance - Comments: Static sitting EOB with supervision for safety   Standing balance support: Bilateral upper extremity supported, During functional activity, Reliant on  assistive device for balance Standing balance-Leahy  Scale: Poor Standing balance comment: Reliant on RW and up to [employer] Arousal/Alertness: Awake/alert Behavior During Therapy: WFL for tasks assessed/performed Overall Cognitive Status: Within Functional Limits for tasks assessed                                 General Comments: Needs encouragement to participate. Pt appreciative of therapy efforts by end of session.        Exercises General Exercises - Lower Extremity Long Arc Quad: AROM, Strengthening, Both, 10 reps, Seated    General Comments        Pertinent Vitals/Pain Pain Assessment Pain Assessment: Faces Faces Pain Scale: Hurts a little bit Pain Location: generalized grimacing with mobility Pain Descriptors / Indicators: Grimacing Pain Intervention(s): Limited activity within patient's tolerance, Monitored during session, Repositioned    Home Living                          Prior Function            PT Goals (current goals can now be found in the care plan section) Acute Rehab PT Goals Patient Stated Goal: to get better and go home eventually PT Goal Formulation: With patient/family Time For Goal Achievement: 01/09/23 Potential to Achieve Goals: Fair Progress towards PT goals: Progressing toward goals    Frequency    Min 2X/week      PT Plan Equipment recommendations need to be updated    Co-evaluation              AM-PAC PT "6 Clicks" Mobility   Outcome Measure  Help needed turning from your back to your side while in a flat bed without using bedrails?: A Little Help needed moving from lying on your back to sitting on the side of a flat bed without using bedrails?: A Little Help needed moving to and from a bed to a chair (including a wheelchair)?: A Little Help needed standing up from a chair using your arms (e.g., wheelchair or bedside chair)?: A Little Help needed to walk in hospital room?: A Little Help needed  climbing 3-5 steps with a railing? : Total 6 Click Score: 16    End of Session Equipment Utilized During Treatment: Gait belt Activity Tolerance: Patient tolerated treatment well Patient left: in chair;with call bell/phone within reach;with family/visitor present Nurse Communication: Mobility status PT Visit Diagnosis: Muscle weakness (generalized) (M62.81);Other abnormalities of gait and mobility (R26.89);Unsteadiness on feet (R26.81);Difficulty in walking, not elsewhere classified (R26.2)     Time: 1165-7903 PT Time Calculation (min) (ACUTE ONLY): 26 min  Charges:  $Gait Training: 8-22 mins $Therapeutic Activity: 8-22 mins                     Moishe Spice, PT, DPT Acute Rehabilitation Services  Office: [phone]    Orvan Falconer 12/27/2022, 3:29 PM

## 2022-12-27 NOTE — Progress Notes (Signed)
   Heart Failure Stewardship Pharmacist Progress Note   PCP: Neale Burly, MD PCP-Cardiologist: Carlyle Dolly, MD    HPI:  67 yo F with PMH of CHF, CAD, HTN, glaucoma, hypothyroidism, OSA, T2DM, obesity, and arthritis.   Known ischemic cardiomyopathy since at least 2018. EF at that time was 30-35% and multivessel CAD on cath. Received 2 stents to circumflex. EF has remained in the 30-40% range since then.   She presented to the ED on 1/10 with shortness of breath and LE edema. Denies orthopnea. CXR with no acute cardiopulmonary disease and stable cardiomegaly. CTA showed large pericardial effusion and trace bilateral pleural fluid. ECHO 1/12 showed LVEF 30-35%, RV normal, findings suspicious for cardiac tamponade. Transferred to [facility] for repeat ECHO and possible pericardiocentesis. Attempt at pericardiocentesis unsuccessful. Pericardial window by CT surgery done 1/13.  Current HF Medications: MRA: spironolactone 12.5 mg daily SGLT2i: Jardiance 25 mg daily  Prior to admission HF Medications: Diuretic: furosemide 40 mg daily Beta blocker: carvedilol 12.5 mg BID ACE/ARB/ARNI: Entresto 97/103 mg BID *not taking Jardiance or spironolactone   Pertinent Lab Values: Serum creatinine 1.44, BUN 30, Potassium 4.3, Sodium 141, BNP 74, Magnesium 2.1, A1c 7.2   Vital Signs: Weight: 392 lbs (admission weight: 352 lbs) Blood pressure: 130/80s  Heart rate: 70-80s  I/O: -2L yesterday; net -3.6L  Medication Assistance / Insurance Benefits Check: Does the patient have prescription insurance?  Yes Type of insurance plan: [facility] Medicare  Outpatient Pharmacy:  Prior to admission outpatient pharmacy: [facility] Is the patient willing to use [facility] at discharge? Yes Is the patient willing to transition their outpatient pharmacy to utilize a [facility] outpatient pharmacy?   Pending    Assessment: 1. Acute on chronic systolic CHF (LVEF 81-59%), due to ICM. NYHA class II symptoms. -  Creatinine improved, agree with starting spironolactone 12.5 mg daily and Jardiance 25 mg daily today - Consider restarting BB - BP and volume improved   Plan: 1) Medication changes recommended at this time: - Restart carvedilol 12.5 mg BID  2) Patient assistance: - None pending  3)  Education  - Patient has been educated on current HF medications and potential additions to HF medication regimen - Patient verbalizes understanding that over the next few months, these medication doses may change and more medications may be added to optimize HF regimen - Patient has been educated on basic disease state pathophysiology and goals of therapy   Kerby Nora, PharmD, BCPS Heart Failure Stewardship Pharmacist Phone [phone]

## 2022-12-27 NOTE — Progress Notes (Signed)
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  She was seen by cardiology and started on IV diuresis.   Diuresis and antihypertensives held on 1/11 due to softer blood pressure readings.  On  1/12, cardiology attempted attempted pericardiocentesis but unsuccessful.  CT surgery took patient on 1/13 for subxiphoid pericardial window with drainage of effusion as well as pericardial biopsy and placement of drain.  By 1/15, chest tube discontinued.    Subjective:  Seen and examined this morning. Complains of indigestion  On RA Let IJ in place Creatinine downtrending 1.4  Assessment and Plan: Principal Problem:   Acute on chronic combined systolic and diastolic CHF (congestive heart failure) (HCC) Active Problems:   Hypothyroidism   Essential hypertension, benign   Morbid obesity/BMI > 55   DM type 2 causing vascular disease (HCC)   Pericardial effusion   Coronary artery disease involving native coronary artery of native heart without angina pectoris   Ischemic cardiomyopathy   Cardiac tamponade  Acute on chronic systolic congestive heart failure Large pericardial effusion with dyspnea worsening past 2 weeks> status post pericardial window 1/13: pericardiocentesis attempted  1/12 unsuccessful s/p pericardial window with drain placement and biopsy 1/13 by CT surgery.  Drain has been discontinued 1/15 doing well on room air.  Creatinine improving, per cardiology starting Jardiance 1/17, on Aldactone  since 1/17.GDMT per cardiology. Remains on room air.  Continue to monitor intake output Daily  weight and renal functions Net IO Since Admission: -3,567 mL [12/27/22 1006]  Filed Weights   12/23/22 0711 12/25/22 0512 12/26/22 0400  Weight: (!) 159.6 kg (!) 206.4 kg (!) 178.1 kg     Ischemic cardiomyopathy/CAD-DES to LAD and RCA 2018/PAD Hyperretension Hyperlipidemia: BP is well-controlled this morning continue Aldactone.Continue Lipitor, Plavix.   AKI on stage IIIa CKD: Creatinine improving to 1.5.  Diuretics were held.  Defer to cardiology. Recent Labs    08/26/22 2027 12/08/22 1110 12/20/22 0953 12/21/22 0435 12/22/22 0121 12/23/22 0052 12/24/22 0510 12/25/22 0500 12/26/22 0500 12/27/22 0553  BUN 20 32* 21 24* 28* 30* 38* 45* 38* 30*  CREATININE 1.17* 1.71* 1.47* 1.64* 2.14* 1.99* 2.17* 2.13* 1.59* 1.44*    Severe hypothyroidism TSH 130 continue levothyroxine as ordered,followed by endocrinology Dr. Dorris Fetch, remote history of thyroidectomy for benign goiter  Type 2 diabetes mellitus A1c 7.3 on Tresiba 50 units at nighttime NovoLog 10 units 3 times daily at home.  Blood sugar well-controlled on currentSemglee 10 units bedtime, NovoLog to 3 units 3 times daily and SSI: cont to monitor and adjust Recent Labs  Lab 12/26/22 1148 12/26/22 1616 12/26/22 2107 12/27/22 0622 12/27/22 0945  GLUCAP 160* 108* 149* 110* 145*     Morbid Obesity:Patient's Body mass index is 63.4 kg/m. : Will benefit with PCP follow-up, weight loss  healthy lifestyle OSA patient noncompliant with CPAP.  DVT prophylaxis: heparin injection 5,000 Units Start: 12/20/22 1400 Code Status:   Code Status: Full Code Family Communication: plan of care discussed with patient at bedside. Patient status is: inpatient because of pericardial efffusion  Level of care: Telemetry Cardiac  Dispo: The patient is from: Home  Anticipated disposition: Likely by the end of the week. cont PT OT> recommending skilled nursing facility Objective: Vitals last 24 hrs: Vitals:   12/27/22 0015 12/27/22 0347  12/27/22 0351 12/27/22 0750  BP: (!) 145/77 (!) 152/95 (!) 152/95 138/86  Pulse: 81 84 84   Resp: '20 18 18 18  '$ Temp: 98.2 F (36.8 C) 98.1 F (36.7 C) 98.1 F (36.7 C) 98.1 F (36.7 C)  TempSrc: Oral Oral  Oral  SpO2: 95% 100%  100%  Weight:      Height:       Weight change:   Physical Examination: General exam: AA0x3, weak,older appearing HEENT:Oral mucosa moist, Ear/Nose WNL grossly, dentition normal. Respiratory system: bilaterally diminished BS, no use of accessory muscle Cardiovascular system: S1 & S2 +, regular rate. Gastrointestinal system: Abdomen soft, obeseNT,ND,BS+ Nervous System:Alert, awake, moving extremities and grossly nonfocal Extremities: LE ankle edema /lymphedema bilateral lower, lower extremities warm Skin: No rashes,no icterus. MSK: Normal muscle bulk,tone, power   IJ rt neck +  Medications reviewed:  Scheduled Meds:  atorvastatin  80 mg Oral QPM   brimonidine  1 drop Both Eyes BID   And   timolol  1 drop Both Eyes BID   calcium carbonate  4 tablet Oral QPM   Chlorhexidine Gluconate Cloth  6 each Topical Daily   clopidogrel  75 mg Oral Daily   empagliflozin  25 mg Oral Daily   heparin  5,000 Units Subcutaneous Q8H   insulin aspart  0-15 Units Subcutaneous TID WC   insulin aspart  0-5 Units Subcutaneous QHS   insulin aspart  3 Units Subcutaneous TID WC   insulin glargine-yfgn  10 Units Subcutaneous QHS   levothyroxine  200 mcg Oral QAC breakfast   levothyroxine  50 mcg Oral QAC breakfast   loratadine  10 mg Oral Daily   pantoprazole  40 mg Oral Daily   polyethylene glycol  17 g Oral Daily   senna-docusate  2 tablet Oral BID   sodium chloride flush  3 mL Intravenous Q12H   spironolactone  12.5 mg Oral Daily   Continuous Infusions:  sodium chloride 10 mL/hr at 12/22/22 1313   Diet Order             Diet heart healthy/carb modified Room service appropriate? Yes with Assist; Fluid consistency: Thin  Diet effective now                   Intake/Output Summary (Last 24 hours) at 12/27/2022 1006 Last data filed at 12/27/2022 0351 Gross per 24 hour  Intake --  Output 1850 ml  Net -1850 ml    Net IO Since Admission: -3,567 mL [12/27/22 1006]  Wt Readings from Last 3 Encounters:  12/26/22 (!) 178.1 kg  12/08/22 (!) 161.5 kg  07/26/22 (!) 161.5 kg     Unresulted Labs (From admission, onward)     Start     Ordered   12/26/22 0932  Basic metabolic panel  Daily,   R     Question:  Specimen collection method  Answer:  Unit=Unit collect   12/25/22 1551   12/24/22 0500  CBC  Daily,   R      12/23/22 1103   12/23/22 0853  Acid Fast Culture with reflexed sensitivities  RELEASE UPON ORDERING,   STAT       Comments: Specimen B: Phone [phone]         Previous Biopsy:  no Is the patient on airborne/droplet precautions? No  Clinical History:  pericardial fluid Copy of Report to:  Dr. Lavonna Monarch Specimen Disposition: MICRO     12/23/22 0853   12/23/22 0853  Acid Fast Smear (AFB)  RELEASE UPON ORDERING,   STAT       Comments: Specimen B: Phone [phone]         Previous Biopsy:  no Is the patient on airborne/droplet precautions? No           Clinical History:  pericardial fluid Copy of Report to:  Dr. Lavonna Monarch Specimen Disposition: MICRO     12/23/22 0853   12/22/22 1544  Protein, body fluid (other)  RELEASE UPON ORDERING,   TIMED       Comments: Specimen A: Pre-op diagnosis: chest pain    12/22/22 1544   12/22/22 1544  LD, Body Fluid (other)  RELEASE UPON ORDERING,   TIMED       Comments: Specimen A: Pre-op diagnosis: chest pain    12/22/22 1544   12/22/22 1544  Body fluid cell count with differential  RELEASE UPON ORDERING,   TIMED       Comments: Specimen A: Pre-op diagnosis: chest pain    12/22/22 1544          Data Reviewed: I have personally reviewed following labs and imaging studies CBC: Recent Labs  Lab 12/22/22 0121 12/24/22 0510 12/25/22 0500 12/26/22 0500 12/27/22 0553  WBC 6.9  11.2* 8.8 7.8 7.8  HGB 10.3* 11.4* 10.7* 11.7* 11.4*  HCT 33.8* 36.0 35.5* 37.9 35.4*  MCV 92.1 89.8 92.0 90.2 87.6  PLT 182 189 188 193 211    Basic Metabolic Panel: Recent Labs  Lab 12/21/22 0435 12/22/22 0121 12/23/22 0052 12/24/22 0510 12/25/22 0500 12/26/22 0500 12/27/22 0553  NA 145 142 138 139 139 141 141  K 3.8 4.1 4.1 4.7 4.3 4.5 4.3  CL 109 107 105 105 105 104 104  CO2 '29 25 25 27 29 29 30  '$ GLUCOSE 126* 78 125* 197* 122* 144* 111*  BUN 24* 28* 30* 38* 45* 38* 30*  CREATININE 1.64* 2.14* 1.99* 2.17* 2.13* 1.59* 1.44*  CALCIUM 8.3* 8.0* 8.1* 7.6* 7.9* 8.7* 8.5*  MG 2.1 2.1  --   --   --   --   --     Recent Results (from the past 240 hour(s))  Resp panel by RT-PCR (RSV, Flu A&B, Covid) Anterior Nasal Swab     Status: None   Collection Time: 12/20/22 10:11 AM   Specimen: Anterior Nasal Swab  Result Value Ref Range Status   SARS Coronavirus 2 by RT PCR NEGATIVE NEGATIVE Final    Comment: (NOTE) SARS-CoV-2 target nucleic acids are NOT DETECTED.  The SARS-CoV-2 RNA is generally detectable in upper respiratory specimens during the acute phase of infection. The lowest concentration of SARS-CoV-2 viral copies this assay can detect is 138 copies/mL. A negative result does not preclude SARS-Cov-2 infection and should not be used as the sole basis for treatment or other patient management decisions. A negative result may occur with  improper specimen collection/handling, submission of specimen other than nasopharyngeal swab, presence of viral mutation(s) within the areas targeted by this assay, and inadequate number of viral copies(<138 copies/mL). A negative result must be combined with clinical observations, patient history, and epidemiological information. The expected result is Negative.  Fact Sheet for Patients:  EntrepreneurPulse.com.au  Fact Sheet for Healthcare Providers:  IncredibleEmployment.be  This test is no t yet  approved or cleared by the [place] and  has been authorized for detection and/or diagnosis of SARS-CoV-2 by FDA under an Emergency Use Authorization (EUA). This EUA will remain  in effect (meaning this test can be used) for the duration of the COVID-19 declaration under Section 564(b)(1) of the Act, 21 U.S.C.section 360bbb-3(b)(1), unless the authorization is terminated  or revoked sooner.       Influenza A by PCR NEGATIVE NEGATIVE Final   Influenza B by PCR NEGATIVE NEGATIVE Final    Comment: (NOTE) The Xpert Xpress SARS-CoV-2/FLU/RSV plus assay is intended as an aid in the diagnosis of influenza from Nasopharyngeal swab specimens and should not be used as a sole basis for treatment. Nasal washings and aspirates are unacceptable for Xpert Xpress SARS-CoV-2/FLU/RSV testing.  Fact Sheet for Patients: EntrepreneurPulse.com.au  Fact Sheet for Healthcare Providers: IncredibleEmployment.be  This test is not yet approved or cleared by the [place] FDA and has been authorized for detection and/or diagnosis of SARS-CoV-2 by FDA under an Emergency Use Authorization (EUA). This EUA will remain in effect (meaning this test can be used) for the duration of the COVID-19 declaration under Section 564(b)(1) of the Act, 21 U.S.C. section 360bbb-3(b)(1), unless the authorization is terminated or revoked.     Resp Syncytial Virus by PCR NEGATIVE NEGATIVE Final    Comment: (NOTE) Fact Sheet for Patients: EntrepreneurPulse.com.au  Fact Sheet for Healthcare Providers: IncredibleEmployment.be  This test is not yet approved or cleared by the [place] FDA and has been authorized for detection and/or diagnosis of SARS-CoV-2 by FDA under an Emergency Use Authorization (EUA). This EUA will remain in effect (meaning this test can be used) for the duration of the COVID-19 declaration under Section 564(b)(1) of  the Act, 21 U.S.C. section 360bbb-3(b)(1), unless the authorization is terminated or revoked.  Performed at [facility], [address], [place], [place] [address]   Surgical pcr screen     Status: None   Collection Time: 12/23/22  7:12 AM   Specimen: Nasal Mucosa; Nasal Swab  Result Value Ref Range Status   MRSA, PCR NEGATIVE NEGATIVE Final   Staphylococcus aureus NEGATIVE NEGATIVE Final    Comment: (NOTE) The Xpert SA Assay (FDA approved for NASAL specimens in patients 78 years of age and older), is one component of a comprehensive surveillance program. It is not intended to diagnose infection nor to guide or monitor treatment. Performed at [facility], [place] [address] [place], [place] [address]   Aerobic/Anaerobic Culture w Gram Stain (surgical/deep wound)     Status: None (Preliminary result)   Collection Time: 12/23/22  8:52 AM   Specimen: PATH Cytology Misc. fluid; Body Fluid  Result Value Ref Range Status   Specimen Description PERITONEAL  Final   Special Requests NONE  Final   Gram Stain NO ORGANISMS SEEN NO WBC SEEN   Final   Culture   Final    NO GROWTH 4 DAYS NO ANAEROBES ISOLATED; CULTURE IN PROGRESS FOR 5 DAYS Performed at [facility], [address] [place], [place] [address]    Report Status PENDING  Incomplete  Fungus Stain     Status: None   Collection Time: 12/23/22  8:52 AM   Specimen: PATH Cytology Misc. fluid; Body Fluid  Result Value Ref Range Status   FUNGUS STAIN Final report  Final    Comment: (NOTE) Performed At: [facility] [address] [place], [place] [id number] Rush Farmer MD CH:[id number]    Fungal Source PERITONEAL  Final    Comment: Performed  at [facility], [place] [address], [place], [place] [address]  Fungal Stain reflex     Status: None   Collection Time: 12/23/22  8:52 AM  Result Value Ref Range Status   Fungal stain result 1 Comment  Final    Comment: (NOTE) KOH/Calcofluor preparation:  no  fungus observed. A duplicate report has been generated due to demographic updates. Performed At: [facility] [place], [place] [id number] Rush Farmer MD OE:[id number]     Antimicrobials: Anti-infectives (From admission, onward)    None      Culture/Microbiology    Component Value Date/Time   SDES PERITONEAL 12/23/2022 0852   SPECREQUEST NONE 12/23/2022 0852   CULT  12/23/2022 0852    NO GROWTH 4 DAYS NO ANAEROBES ISOLATED; CULTURE IN PROGRESS FOR 5 DAYS Performed at [place] [address], [place], [place] [address]    REPTSTATUS PENDING 12/23/2022 7169   Other culture-see note  Radiology Studies: No results found.   LOS: 7 days   Antonieta Pert, MD [facility]  12/27/2022, 10:06 AM

## 2022-12-27 NOTE — TOC Progression Note (Signed)
Transition of Care [facility]) - Progression Note    Patient Details  Name: Maureen Jackson MRN: [id number] Date of Birth: [birth year]  Transition of Care [facility] CM/SW Contact  Vinie Sill, [place] Phone Number: 12/27/2022, 4:43 PM  Clinical Narrative:     CSW provided patient with bed offer. She requested to call her daughter- called and LVM.  TOC will continue to follow and assist with discharge planning.  Thurmond Butts, MSW, LCSW Clinical Social Worker    Expected Discharge Plan: Skilled Nursing Facility Barriers to Discharge: Ship broker, Continued Medical Work up, SNF Pending bed offer  Expected Discharge Plan and Services In-house Referral: Clinical Social Work Discharge Planning Services: CM Consult Post Acute Care Choice: Durable Medical Equipment Living arrangements for the past 2 months: Apartment                                       Social Determinants of Health (SDOH) Interventions SDOH Screenings   Food Insecurity: No Food Insecurity (12/26/2022)  Housing: Low Risk  (12/22/2022)  Transportation Needs: No Transportation Needs (12/22/2022)  Utilities: Not At Risk (12/26/2022)  Alcohol Screen: Low Risk  (12/22/2022)  Depression (PHQ2-9): Low Risk  (01/01/2020)  Financial Resource Strain: Low Risk  (12/22/2022)  Tobacco Use: Medium Risk (12/25/2022)    Readmission Risk Interventions    12/21/2022    9:38 AM 05/19/2020    8:46 AM  Readmission Risk Prevention Plan  Transportation Screening Complete Complete  PCP or Specialist Appt within 3-5 Days  Not Complete  Home Care Screening Complete   Medication Review (RN CM) Complete   HRI or Home Care Consult  Complete  Social Work Consult for [place] Planning/Counseling  Complete  Palliative Care Screening  Not Applicable  Medication Review Press photographer)  Complete

## 2022-12-28 DIAGNOSIS — I5043 Acute on chronic combined systolic (congestive) and diastolic (congestive) heart failure: Secondary | ICD-10-CM | POA: Diagnosis not present

## 2022-12-28 LAB — BASIC METABOLIC PANEL
Anion gap: 6 (ref 5–15)
BUN: 33 mg/dL — ABNORMAL HIGH (ref 8–23)
CO2: 30 mmol/L (ref 22–32)
Calcium: 8.2 mg/dL — ABNORMAL LOW (ref 8.9–10.3)
Chloride: 104 mmol/L (ref 98–111)
Creatinine, Ser: 1.67 mg/dL — ABNORMAL HIGH (ref 0.44–1.00)
GFR, Estimated: 34 mL/min — ABNORMAL LOW (ref >60)
Glucose, Bld: 123 mg/dL — ABNORMAL HIGH (ref 70–99)
Potassium: 4.4 mmol/L (ref 3.5–5.1)
Sodium: 140 mmol/L (ref 135–145)

## 2022-12-28 LAB — AEROBIC/ANAEROBIC CULTURE W GRAM STAIN (SURGICAL/DEEP WOUND)
Culture: NO GROWTH
Gram Stain: NONE SEEN

## 2022-12-28 LAB — CBC
HCT: 33.6 % — ABNORMAL LOW (ref 36.0–46.0)
Hemoglobin: 10.4 g/dL — ABNORMAL LOW (ref 12.0–15.0)
MCH: 28.2 pg (ref 26.0–34.0)
MCHC: 31 g/dL (ref 30.0–36.0)
MCV: 91.1 fL (ref 80.0–100.0)
Platelets: 175 10*3/uL (ref 150–400)
RBC: 3.69 MIL/uL — ABNORMAL LOW (ref 3.87–5.11)
RDW: 15 % (ref 11.5–15.5)
WBC: 8 10*3/uL (ref 4.0–10.5)
nRBC: 0 % (ref 0.0–0.2)

## 2022-12-28 LAB — GLUCOSE, CAPILLARY
Glucose-Capillary: 117 mg/dL — ABNORMAL HIGH (ref 70–99)
Glucose-Capillary: 161 mg/dL — ABNORMAL HIGH (ref 70–99)
Glucose-Capillary: 130 mg/dL — ABNORMAL HIGH (ref 70–99)
Glucose-Capillary: 131 mg/dL — ABNORMAL HIGH (ref 70–99)

## 2022-12-28 LAB — ACID FAST SMEAR (AFB, MYCOBACTERIA): Acid Fast Smear: NEGATIVE

## 2022-12-28 NOTE — Progress Notes (Signed)
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  She was seen by cardiology and started on IV diuresis.   Diuresis and antihypertensives held on 1/11 due to softer blood pressure readings.  On  1/12, cardiology attempted attempted pericardiocentesis but unsuccessful.  CT surgery took patient on 1/13 for subxiphoid pericardial window with drainage of effusion as well as pericardial biopsy and placement of drain.  By 1/15, chest tube discontinued. Creatinine had somewhat improved following pericardial fluid drainage.  Cardiology has adjusted GDMT now on Jardiance, Aldactone. PT OT has worked with her and recommending skilled nursing facility.  Subjective: Seen and examined this morning resting comfortably on the bedside chair She has central line on the right neck. Overnight afebrile, saturating well on room air Creatinine somewhat up 1.6 this morning  Assessment and Plan: Principal Problem:   Acute on chronic combined systolic and diastolic CHF (congestive heart failure) (HCC) Active Problems:   Hypothyroidism   Essential hypertension, benign   Morbid obesity/BMI > 55   DM type 2 causing vascular disease (HCC)   Pericardial effusion   Coronary artery disease involving native coronary artery of native heart without angina pectoris   Ischemic cardiomyopathy   Cardiac tamponade  Acute on chronic systolic congestive heart failure Large pericardial effusion with dyspnea worsening past 2 weeks> s/p pericardial window 1/13: pericardiocentesis attempted  1/12 unsuccessful s/p pericardial window  with drain placement and biopsy 1/13 by CT surgery.  Drain has been discontinued 1/15 doing well on room air.  Creatinine overall stable somewhat elevated at 1.6 today.cardiology adjusted GDMT with Jardiance 1/17, Aldactone . Remains on room air.  Continue to monitor intake output Daily weight and renal functionsNet IO Since Admission: -3,747 mL [12/28/22 1146]  Filed Weights   12/25/22 0512 12/26/22 0400 12/28/22 0500  Weight: (!) 206.4 kg (!) 178.1 kg (!) 158.7 kg     Ischemic cardiomyopathy/CAD-DES to LAD and RCA 2018/PAD Hyperretension Hyperlipidemia: BP is fairly stable.  Continue Aldactone.Continue Lipitor, Plavix-lifelong.    AKI on stage IIIa CKD: Creatinine fluctuating holding and 1.4-1.6.  Diuretics on hold.  Monitor BMP in AM.  Recent Labs    12/08/22 1110 12/20/22 0953 12/21/22 0435 12/22/22 0121 12/23/22 0052 12/24/22 0510 12/25/22 0500 12/26/22 0500 12/27/22 0553 12/28/22 0505  BUN 32* 21 24* 28* 30* 38* 45* 38* 30* 33*  CREATININE 1.71* 1.47* 1.64* 2.14* 1.99* 2.17* 2.13* 1.59* 1.44* 1.67*    Severe hypothyroidism TSH 130 continue levothyroxine as ordered,followed by endocrinology Dr. Dorris Fetch, remote history of thyroidectomy for benign goiter  Type 2 diabetes mellitus A1c 7.3 on Tresiba 50 units at nighttime NovoLog 10 units 3 times daily at home.  Blood sugar well-controlled on currentSemglee 10 units bedtime, NovoLog to 3 units 3 times daily and SSI: cont to monitor and adjust Recent Labs  Lab 12/27/22 1139 12/27/22 1707 12/27/22 2113 12/28/22 0617 12/28/22 1055  GLUCAP 166* 152* 90 117* 161*     Morbid Obesity:Patient's Body mass index is 56.5 kg/m. : Will benefit with PCP follow-up, weight loss  healthy lifestyle OSA patient noncompliant with CPAP. Deconditioning/debility: Continue PT OT continue to mobilize.  Plan is for skilled nursing facility once bed available  DVT prophylaxis: heparin injection 5,000 Units Start: 12/20/22 1400 Code Status:   Code  Status: Full Code Family Communication: plan of care discussed with patient at bedside. I had updated patient's sister previously and also daughter.  Patient status is: inpatient because of pericardial efffusion  Level of care: Telemetry Cardiac  Dispo: The patient is from: Home            Anticipated disposition: Overall stable, will plan for skilled nursing facility once bed available hopefully by tomorrow if renal function stable. TOC INFORMED.  Objective: Vitals last 24 hrs: Vitals:   12/28/22 0408 12/28/22 0500 12/28/22 0804 12/28/22 1055  BP: 137/81  113/75 121/63  Pulse: 74  82 84  Resp: '16  17 18  '$ Temp: 98 F (36.7 C)  98.5 F (36.9 C) 97.6 F (36.4 C)  TempSrc: Oral  Oral Oral  SpO2: 100%  100% 100%  Weight:  (!) 158.7 kg    Height:       Weight change:   Physical Examination: General exam: AAox3, obese,older appearing HEENT:Oral mucosa moist, Ear/Nose WNL grossly, dentition normal. Respiratory system:Bilaterally diminished BS,no use of accessory muscle Cardiovascular system:S1 & S2 +, regular rate.  Peripheral underside of surgical incision healing/glued. Gastrointestinal system: Abdomen soft, obeseNT,ND,BS+ Nervous System:Alert, awake, moving extremities and grossly nonfocal Extremities:LE ankle edema/lymphedema, lower extremities warm Skin:No rashes,no icterus. ZOX:WRUEAV muscle bulk,tone, power  Rt IJ Neck+.  Medications reviewed:  Scheduled Meds:  atorvastatin  80 mg Oral QPM   brimonidine  1 drop Both Eyes BID   And   timolol  1 drop Both Eyes BID   calcium carbonate  4 tablet Oral QPM   Chlorhexidine Gluconate Cloth  6 each Topical Daily   clopidogrel  75 mg Oral Daily   empagliflozin  25 mg Oral Daily   heparin  5,000 Units Subcutaneous Q8H   insulin aspart  0-15 Units Subcutaneous TID WC   insulin aspart  0-5 Units Subcutaneous QHS   insulin aspart  3 Units Subcutaneous TID WC   insulin glargine-yfgn  10 Units Subcutaneous QHS   levothyroxine   200 mcg Oral QAC breakfast   levothyroxine  50 mcg Oral QAC breakfast   loratadine  10 mg Oral Daily   pantoprazole  40 mg Oral Daily   polyethylene glycol  17 g Oral Daily   senna-docusate  2 tablet Oral BID   sodium chloride flush  3 mL Intravenous Q12H   spironolactone  12.5 mg Oral Daily   Continuous Infusions:  sodium chloride 10 mL/hr at 12/22/22 1313   Diet Order             Diet heart healthy/carb modified Room service appropriate? Yes with Assist; Fluid consistency: Thin  Diet effective now                  Intake/Output Summary (Last 24 hours) at 12/28/2022 1146 Last data filed at 12/27/2022 2336 Gross per 24 hour  Intake 120 ml  Output 300 ml  Net -180 ml    Net IO Since Admission: -3,747 mL [12/28/22 1146]  Wt Readings from Last 3 Encounters:  12/28/22 (!) 158.7 kg  12/08/22 (!) 161.5 kg  07/26/22 (!) 161.5 kg     Unresulted Labs (From admission, onward)     Start     Ordered   12/23/22 0853  Acid Fast Culture with reflexed sensitivities  RELEASE UPON ORDERING,   STAT       Comments: Specimen B: Phone [phone]  Previous Biopsy:  no Is the patient on airborne/droplet precautions? No           Clinical History:  pericardial fluid Copy of Report to:  Dr. Lavonna Monarch Specimen Disposition: MICRO     12/23/22 0853   12/23/22 0853  Acid Fast Smear (AFB)  RELEASE UPON ORDERING,   STAT       Comments: Specimen B: Phone [phone]         Previous Biopsy:  no Is the patient on airborne/droplet precautions? No           Clinical History:  pericardial fluid Copy of Report to:  Dr. Lavonna Monarch Specimen Disposition: MICRO     12/23/22 0853   12/22/22 1544  Protein, body fluid (other)  RELEASE UPON ORDERING,   TIMED       Comments: Specimen A: Pre-op diagnosis: chest pain    12/22/22 1544   12/22/22 1544  LD, Body Fluid (other)  RELEASE UPON ORDERING,   TIMED       Comments: Specimen A: Pre-op diagnosis: chest pain    12/22/22 1544   12/22/22 1544   Body fluid cell count with differential  RELEASE UPON ORDERING,   TIMED       Comments: Specimen A: Pre-op diagnosis: chest pain    12/22/22 1544          Data Reviewed: I have personally reviewed following labs and imaging studies CBC: Recent Labs  Lab 12/24/22 0510 12/25/22 0500 12/26/22 0500 12/27/22 0553 12/28/22 0505  WBC 11.2* 8.8 7.8 7.8 8.0  HGB 11.4* 10.7* 11.7* 11.4* 10.4*  HCT 36.0 35.5* 37.9 35.4* 33.6*  MCV 89.8 92.0 90.2 87.6 91.1  PLT 189 188 193 177 867    Basic Metabolic Panel: Recent Labs  Lab 12/22/22 0121 12/23/22 0052 12/24/22 0510 12/25/22 0500 12/26/22 0500 12/27/22 0553 12/28/22 0505  NA 142   < > 139 139 141 141 140  K 4.1   < > 4.7 4.3 4.5 4.3 4.4  CL 107   < > 105 105 104 104 104  CO2 25   < > '27 29 29 30 30  '$ GLUCOSE 78   < > 197* 122* 144* 111* 123*  BUN 28*   < > 38* 45* 38* 30* 33*  CREATININE 2.14*   < > 2.17* 2.13* 1.59* 1.44* 1.67*  CALCIUM 8.0*   < > 7.6* 7.9* 8.7* 8.5* 8.2*  MG 2.1  --   --   --   --   --   --    < > = values in this interval not displayed.    Recent Results (from the past 240 hour(s))  Resp panel by RT-PCR (RSV, Flu A&B, Covid) Anterior Nasal Swab     Status: None   Collection Time: 12/20/22 10:11 AM   Specimen: Anterior Nasal Swab  Result Value Ref Range Status   SARS Coronavirus 2 by RT PCR NEGATIVE NEGATIVE Final    Comment: (NOTE) SARS-CoV-2 target nucleic acids are NOT DETECTED.  The SARS-CoV-2 RNA is generally detectable in upper respiratory specimens during the acute phase of infection. The lowest concentration of SARS-CoV-2 viral copies this assay can detect is 138 copies/mL. A negative result does not preclude SARS-Cov-2 infection and should not be used as the sole basis for treatment or other patient management decisions. A negative result may occur with  improper specimen collection/handling, submission of specimen other than nasopharyngeal swab, presence of viral mutation(s) within  the areas targeted by this assay,  and inadequate number of viral copies(<138 copies/mL). A negative result must be combined with clinical observations, patient history, and epidemiological information. The expected result is Negative.  Fact Sheet for Patients:  EntrepreneurPulse.com.au  Fact Sheet for Healthcare Providers:  IncredibleEmployment.be  This test is no t yet approved or cleared by the [place] FDA and  has been authorized for detection and/or diagnosis of SARS-CoV-2 by FDA under an Emergency Use Authorization (EUA). This EUA will remain  in effect (meaning this test can be used) for the duration of the COVID-19 declaration under Section 564(b)(1) of the Act, 21 U.S.C.section 360bbb-3(b)(1), unless the authorization is terminated  or revoked sooner.       Influenza A by PCR NEGATIVE NEGATIVE Final   Influenza B by PCR NEGATIVE NEGATIVE Final    Comment: (NOTE) The Xpert Xpress SARS-CoV-2/FLU/RSV plus assay is intended as an aid in the diagnosis of influenza from Nasopharyngeal swab specimens and should not be used as a sole basis for treatment. Nasal washings and aspirates are unacceptable for Xpert Xpress SARS-CoV-2/FLU/RSV testing.  Fact Sheet for Patients: EntrepreneurPulse.com.au  Fact Sheet for Healthcare Providers: IncredibleEmployment.be  This test is not yet approved or cleared by the [place] FDA and has been authorized for detection and/or diagnosis of SARS-CoV-2 by FDA under an Emergency Use Authorization (EUA). This EUA will remain in effect (meaning this test can be used) for the duration of the COVID-19 declaration under Section 564(b)(1) of the Act, 21 U.S.C. section 360bbb-3(b)(1), unless the authorization is terminated or revoked.     Resp Syncytial Virus by PCR NEGATIVE NEGATIVE Final    Comment: (NOTE) Fact Sheet for  Patients: EntrepreneurPulse.com.au  Fact Sheet for Healthcare Providers: IncredibleEmployment.be  This test is not yet approved or cleared by the [place] FDA and has been authorized for detection and/or diagnosis of SARS-CoV-2 by FDA under an Emergency Use Authorization (EUA). This EUA will remain in effect (meaning this test can be used) for the duration of the COVID-19 declaration under Section 564(b)(1) of the Act, 21 U.S.C. section 360bbb-3(b)(1), unless the authorization is terminated or revoked.  Performed at [facility] [address], [place], [place] [address]   Surgical pcr screen     Status: None   Collection Time: 12/23/22  7:12 AM   Specimen: Nasal Mucosa; Nasal Swab  Result Value Ref Range Status   MRSA, PCR NEGATIVE NEGATIVE Final   Staphylococcus aureus NEGATIVE NEGATIVE Final    Comment: (NOTE) The Xpert SA Assay (FDA approved for NASAL specimens in patients 28 years of age and older), is one component of a comprehensive surveillance program. It is not intended to diagnose infection nor to guide or monitor treatment. Performed at [facility], [place] [address] [place], [place] [address]   Aerobic/Anaerobic Culture w Gram Stain (surgical/deep wound)     Status: None (Preliminary result)   Collection Time: 12/23/22  8:52 AM   Specimen: PATH Cytology Misc. fluid; Body Fluid  Result Value Ref Range Status   Specimen Description PERITONEAL  Final   Special Requests NONE  Final   Gram Stain NO ORGANISMS SEEN NO WBC SEEN   Final   Culture   Final    NO GROWTH 4 DAYS NO ANAEROBES ISOLATED; CULTURE IN PROGRESS FOR 5 DAYS Performed at [facility], [address], [place], [place] [address]    Report Status PENDING  Incomplete  Fungus Stain     Status: None   Collection Time: 12/23/22  8:52 AM   Specimen: PATH Cytology Misc. fluid; Body Fluid  Result Value Ref Range Status   FUNGUS STAIN Final report  Final     Comment: (NOTE) Performed At: [facility] [address] [place], [place] [id number] Rush Farmer MD YF:[id number]    Fungal Source PERITONEAL  Final    Comment: Performed at [facility], [place] [address], [place], [place] [address]  Fungal Stain reflex     Status: None   Collection Time: 12/23/22  8:52 AM  Result Value Ref Range Status   Fungal stain result 1 Comment  Final    Comment: (NOTE) KOH/Calcofluor preparation:  no fungus observed. A duplicate report has been generated due to demographic updates. Performed At: [facility] [place], [place] [id number] Rush Farmer MD VX:[id number]     Antimicrobials: Anti-infectives (From admission, onward)    None      Culture/Microbiology    Component Value Date/Time   SDES PERITONEAL 12/23/2022 0852   SPECREQUEST NONE 12/23/2022 0852   CULT  12/23/2022 0852    NO GROWTH 4 DAYS NO ANAEROBES ISOLATED; CULTURE IN PROGRESS FOR 5 DAYS Performed at [facility] [address], [place], [place] [address]    REPTSTATUS PENDING 12/23/2022 6226   Other culture-see note  Radiology Studies: No results found.   LOS: 8 days   Antonieta Pert, MD [facility]  12/28/2022, 11:46 AM

## 2022-12-28 NOTE — TOC Progression Note (Signed)
Transition of Care [facility]) - Progression Note    Patient Details  Name: Maureen Jackson MRN: [id number] Date of Birth: 03-Mar-1956  Transition of Care [facility]) CM/SW [place], [place] Phone Number: 12/28/2022, 3:46 PM  Clinical Narrative:     CSW met with pt and pt's sister bedside to get SNF choice. Pt and pt's sister are agreeable to [facility]. CSW submitted SNF auth request in navi portal; Status pending. [facility]#[id number]  CSW notified [facility]; awaiting confirmation.    Expected Discharge Plan: Skilled Nursing Facility Barriers to Discharge: Ship broker, Continued Medical Work up, SNF Pending bed offer  Expected Discharge Plan and Services In-house Referral: Clinical Social Work Discharge Planning Services: CM Consult Post Acute Care Choice: Durable Medical Equipment Living arrangements for the past 2 months: Apartment                                       Social Determinants of Health (SDOH) Interventions SDOH Screenings   Food Insecurity: No Food Insecurity (12/26/2022)  Housing: Low Risk  (12/22/2022)  Transportation Needs: No Transportation Needs (12/22/2022)  Utilities: Not At Risk (12/26/2022)  Alcohol Screen: Low Risk  (12/22/2022)  Depression (PHQ2-9): Low Risk  (01/01/2020)  Financial Resource Strain: Low Risk  (12/22/2022)  Tobacco Use: Medium Risk (12/25/2022)    Readmission Risk Interventions    12/21/2022    9:38 AM 05/19/2020    8:46 AM  Readmission Risk Prevention Plan  Transportation Screening Complete Complete  PCP or Specialist Appt within 3-5 Days  Not Complete  Home Care Screening Complete   Medication Review (RN CM) Complete   HRI or Home Care Consult  Complete  Social Work Consult for [place] Planning/Counseling  Complete  Palliative Care Screening  Not Applicable  Medication Review Press photographer)  Complete

## 2022-12-28 NOTE — Progress Notes (Signed)
Occupational Therapy Evaluation Patient Details Name: Maureen Jackson MRN: [id number] DOB: [birth year] Today's Date: 12/28/2022   History of Present Illness 67 y.o.female who was admitted on 1/10 with acute systolic CHF exacerbation with LVEF 30-35% and noted pericardial effusion that is concerning for possible tamponade.  On 1/12, cardiology attempted pericardiocentesis but unsuccessful.  CT surgery took patient on 1/13 for subxiphoid pericardial window with drainage of effusion as well as pericardial biopsy and placement of drain.  By 1/15, chest tube discontinued. PMH: morbid obesity,CAD,HTN,severe hypothyroidism,T2DM, CKD stage IIIa, chronic systolic and diastolic CHF with LVEF 29%, and chronic pericardial effusion   Clinical Impression   PTA patient reports independent with mobility, needing assist for LB ADLs, showers and IADLs from PCA or family.  She lives alone in an apartment.  Admitted for above and presents with problem list below.  She completes transfers with min assist using RW, short distance mobility using RW with min guard assist, ADLs with setup to mod assist.  She will benefit from further OT services acutely and after dc at SNF level to optimize independence, safety and return to PLOF.      Recommendations for follow up therapy are one component of a multi-disciplinary discharge planning process, led by the attending physician.  Recommendations may be updated based on patient status, additional functional criteria and insurance authorization.   Follow Up Recommendations  Skilled nursing-short term rehab (<3 hours/day)     Assistance Recommended at Discharge Frequent or constant Supervision/Assistance  Patient can return home with the following A little help with walking and/or transfers;A lot of help with bathing/dressing/bathroom;Assistance with cooking/housework;Assist for transportation;Help with stairs or ramp for entrance    Functional Status Assessment  Patient has had  a recent decline in their functional status and demonstrates the ability to make significant improvements in function in a reasonable and predictable amount of time.  Equipment Recommendations  BSC/3in1 (bariatric)    Recommendations for Other Services       Precautions / Restrictions Precautions Precautions: Fall Restrictions Weight Bearing Restrictions: No      Mobility Bed Mobility               General bed mobility comments: OOB upon entry    Transfers Overall transfer level: Needs assistance Equipment used: Rolling walker (2 wheels) Transfers: Sit to/from Stand Sit to Stand: Min assist           General transfer comment: pushing from recliner and BSC, min assist to fully ascend and steady      Balance Overall balance assessment: Needs assistance Sitting-balance support: No upper extremity supported, Feet supported Sitting balance-Leahy Scale: Fair     Standing balance support: Bilateral upper extremity supported, During functional activity, Single extremity supported Standing balance-Leahy Scale: Poor Standing balance comment: relies on UE and external support                           ADL either performed or assessed with clinical judgement   ADL Overall ADL's : Needs assistance/impaired     Grooming: Set up;Sitting           Upper Body Dressing : Set up;Sitting   Lower Body Dressing: Moderate assistance;Sit to/from stand Lower Body Dressing Details (indicate cue type and reason): baseline assist with LB ADLS Toilet Transfer: Minimal assistance;Ambulation;Rolling walker (2 wheels);BSC/3in1           Functional mobility during ADLs: Minimal assistance;Rolling walker (2 wheels)  Vision   Vision Assessment?: No apparent visual deficits     Perception     Praxis      Pertinent Vitals/Pain Pain Assessment Pain Assessment: Faces Faces Pain Scale: Hurts a little bit Pain Location: stomach Pain Descriptors /  Indicators: Discomfort Pain Intervention(s): Limited activity within patient's tolerance, Monitored during session, Repositioned     Hand Dominance Right   Extremity/Trunk Assessment Upper Extremity Assessment Upper Extremity Assessment: Overall WFL for tasks assessed   Lower Extremity Assessment Lower Extremity Assessment: Defer to PT evaluation       Communication Communication Communication: No difficulties   Cognition Arousal/Alertness: Awake/alert Behavior During Therapy: WFL for tasks assessed/performed Overall Cognitive Status: Within Functional Limits for tasks assessed                                       General Comments  VSS on RA, NT and RN aware pt on [facility]    Exercises     Shoulder Instructions      Home Living Family/patient expects to be discharged to:: Private residence Living Arrangements: Alone Available Help at Discharge: Personal care attendant Type of Home: Apartment Home Access: Level entry     Home Layout: One level     Bathroom Shower/Tub: Teacher, early years/pre: Standard Bathroom Accessibility: Yes How Accessible: Accessible via walker Home Equipment: Grab bars - toilet;Grab bars - tub/shower   Additional Comments: Has PCA that comes 3x per week (MWF 2:15-5:15) and provides groceries, cleans, patient reports PCA assist with some self care (showers, LB dressing).      Prior Functioning/Environment Prior Level of Function : Independent/Modified Independent             Mobility Comments: reports she was able to get up and walk in home, short distances prior to admission ADLs Comments: reports she was independent with ADLs but gets some assist from PCA as needed for LB dressing and showers, she does not drive using transportation service. Aide assists with meals/cleaning, pt manages her medication.        OT Problem List: Decreased strength;Decreased activity tolerance;Impaired balance (sitting and/or  standing);Obesity;Pain;Decreased knowledge of use of DME or AE      OT Treatment/Interventions: Self-care/ADL training;Therapeutic exercise;DME and/or AE instruction;Therapeutic activities;Patient/family education;Balance training    OT Goals(Current goals can be found in the care plan section) Acute Rehab OT Goals Patient Stated Goal: home OT Goal Formulation: With patient Time For Goal Achievement: 01/11/23 Potential to Achieve Goals: Fair  OT Frequency: Min 2X/week    Co-evaluation              AM-PAC OT "6 Clicks" Daily Activity     Outcome Measure Help from another person eating meals?: None Help from another person taking care of personal grooming?: A Little Help from another person toileting, which includes using toliet, bedpan, or urinal?: A Lot Help from another person bathing (including washing, rinsing, drying)?: A Lot Help from another person to put on and taking off regular upper body clothing?: A Little Help from another person to put on and taking off regular lower body clothing?: A Lot 6 Click Score: 16   End of Session Equipment Utilized During Treatment: Gait belt;Rolling walker (2 wheels) Nurse Communication: Mobility status  Activity Tolerance: Patient tolerated treatment well Patient left: in chair;with call bell/phone within reach  OT Visit Diagnosis: Other abnormalities of gait and mobility (R26.89);Muscle  weakness (generalized) (M62.81);Pain Pain - part of body:  (stomach)                Time: 8367-2550 OT Time Calculation (min): 22 min Charges:  OT General Charges $OT Visit: 1 Visit OT Evaluation $OT Eval Moderate Complexity: 1 Mod  Jolaine Artist, OT Acute Rehabilitation Services Office [phone]   Delight Stare 12/28/2022, 10:57 AM

## 2022-12-28 NOTE — TOC Progression Note (Signed)
Transition of Care [facility]) - Progression Note    Patient Details  Name: Maureen Jackson MRN: [id number] Date of Birth: [birth year]  Transition of [facility]) CM/SW Contact  Vinie Sill, [place] Phone Number: 12/28/2022, 1:34 PM  Clinical Narrative:     Spoke with patient- informed she only has one bed offer w/ [facility] - patient still will not confirm if she is willing to accept the offer at this time. CSW explained its SNF or Home. Patient states understanding. CSW advised TOC will follow up later today possible  decision.  Thurmond Butts, MSW, LCSW Clinical Social Worker    Expected Discharge Plan: Skilled Nursing Facility Barriers to Discharge: Ship broker, Continued Medical Work up, SNF Pending bed offer  Expected Discharge Plan and Services In-house Referral: Clinical Social Work Discharge Planning Services: CM Consult Post Acute Care Choice: Durable Medical Equipment Living arrangements for the past 2 months: Apartment                                       Social Determinants of Health (SDOH) Interventions SDOH Screenings   Food Insecurity: No Food Insecurity (12/26/2022)  Housing: Low Risk  (12/22/2022)  Transportation Needs: No Transportation Needs (12/22/2022)  Utilities: Not At Risk (12/26/2022)  Alcohol Screen: Low Risk  (12/22/2022)  Depression (PHQ2-9): Low Risk  (01/01/2020)  Financial Resource Strain: Low Risk  (12/22/2022)  Tobacco Use: Medium Risk (12/25/2022)    Readmission Risk Interventions    12/21/2022    9:38 AM 05/19/2020    8:46 AM  Readmission Risk Prevention Plan  Transportation Screening Complete Complete  PCP or Specialist Appt within 3-5 Days  Not Complete  Home Care Screening Complete   Medication Review (RN CM) Complete   HRI or Home Care Consult  Complete  Social Work Consult for [place] Planning/Counseling  Complete  Palliative Care Screening  Not Applicable  Medication Review Press photographer)  Complete

## 2022-12-28 NOTE — Plan of Care (Signed)

## 2022-12-28 NOTE — Progress Notes (Signed)
   Heart Failure Stewardship Pharmacist Progress Note   PCP: Neale Burly, MD PCP-Cardiologist: Carlyle Dolly, MD    HPI:  67 yo F with PMH of CHF, CAD, HTN, glaucoma, hypothyroidism, OSA, T2DM, obesity, and arthritis.   Known ischemic cardiomyopathy since at least 2018. EF at that time was 30-35% and multivessel CAD on cath. Received 2 stents to circumflex. EF has remained in the 30-40% range since then.   She presented to the ED on 1/10 with shortness of breath and LE edema. Denies orthopnea. CXR with no acute cardiopulmonary disease and stable cardiomegaly. CTA showed large pericardial effusion and trace bilateral pleural fluid. ECHO 1/12 showed LVEF 30-35%, RV normal, findings suspicious for cardiac tamponade. Transferred to [facility] for repeat ECHO and possible pericardiocentesis. Attempt at pericardiocentesis unsuccessful. Pericardial window by CT surgery done 1/13.  Current HF Medications: MRA: spironolactone 12.5 mg daily SGLT2i: Jardiance 25 mg daily  Prior to admission HF Medications: Diuretic: furosemide 40 mg daily Beta blocker: carvedilol 12.5 mg BID ACE/ARB/ARNI: Entresto 97/103 mg BID *not taking Jardiance or spironolactone   Pertinent Lab Values: Serum creatinine 1.67, BUN 33, Potassium 4.4, Sodium 140, BNP 74, Magnesium 2.1, A1c 7.2   Vital Signs: Weight: 349 lbs (admission weight: 352 lbs) Blood pressure: 110-130/80s  Heart rate: 70-80s  I/O: -0.5L yesterday; net -3.7L  Medication Assistance / Insurance Benefits Check: Does the patient have prescription insurance?  Yes Type of insurance plan: [facility] Medicare  Outpatient Pharmacy:  Prior to admission outpatient pharmacy[facility] Is the patient willing to use [facility] at discharge? Yes Is the patient willing to transition their outpatient pharmacy to utilize a [facility] outpatient pharmacy?   Pending    Assessment: 1. Acute on chronic systolic CHF (LVEF 80-32%), due to ICM. NYHA class II  symptoms. - Continue spironolactone 12.5 mg daily and Jardiance 25 mg daily today - Consider restarting BB for HFrEF - BP and volume improved   Plan: 1) Medication changes recommended at this time: - Restart carvedilol 12.5 mg BID  2) Patient assistance: - None pending  3)  Education  - Patient has been educated on current HF medications and potential additions to HF medication regimen - Patient verbalizes understanding that over the next few months, these medication doses may change and more medications may be added to optimize HF regimen - Patient has been educated on basic disease state pathophysiology and goals of therapy   Kerby Nora, PharmD, BCPS Heart Failure Stewardship Pharmacist Phone [phone]

## 2022-12-28 NOTE — Progress Notes (Signed)
Rounding Note    Patient Name: Maureen Jackson Date of Encounter: 12/28/2022  [place] Cardiologist: Carlyle Dolly, MD   Subjective   Better.  Less short of breath. In chair beside bed  Inpatient Medications    Scheduled Meds:  atorvastatin  80 mg Oral QPM   brimonidine  1 drop Both Eyes BID   And   timolol  1 drop Both Eyes BID   calcium carbonate  4 tablet Oral QPM   Chlorhexidine Gluconate Cloth  6 each Topical Daily   clopidogrel  75 mg Oral Daily   empagliflozin  25 mg Oral Daily   heparin  5,000 Units Subcutaneous Q8H   insulin aspart  0-15 Units Subcutaneous TID WC   insulin aspart  0-5 Units Subcutaneous QHS   insulin aspart  3 Units Subcutaneous TID WC   insulin glargine-yfgn  10 Units Subcutaneous QHS   levothyroxine  200 mcg Oral QAC breakfast   levothyroxine  50 mcg Oral QAC breakfast   loratadine  10 mg Oral Daily   pantoprazole  40 mg Oral Daily   polyethylene glycol  17 g Oral Daily   senna-docusate  2 tablet Oral BID   sodium chloride flush  3 mL Intravenous Q12H   spironolactone  12.5 mg Oral Daily   Continuous Infusions:  sodium chloride 10 mL/hr at 12/22/22 1313   PRN Meds: acetaminophen, albuterol, alum & mag hydroxide-simeth, bisacodyl, diclofenac Sodium, morphine injection, ondansetron (ZOFRAN) IV, oxyCODONE, traMADol   Vital Signs    Vitals:   12/27/22 2334 12/28/22 0408 12/28/22 0500 12/28/22 0804  BP: 121/66 137/81  113/75  Pulse: 75 74  82  Resp: '16 16  17  '$ Temp: 98 F (36.7 C) 98 F (36.7 C)  98.5 F (36.9 C)  TempSrc: Oral Oral  Oral  SpO2: 100% 100%  100%  Weight:   (!) 158.7 kg   Height:        Intake/Output Summary (Last 24 hours) at 12/28/2022 1056 Last data filed at 12/27/2022 2336 Gross per 24 hour  Intake 120 ml  Output 300 ml  Net -180 ml      12/28/2022    5:00 AM 12/26/2022    4:00 AM 12/25/2022    5:12 AM  Last 3 Weights  Weight (lbs) 349 lb 14.4 oz 392 lb 10.2 oz 455 lb 0.5 oz  Weight (kg)  158.714 kg 178.1 kg 206.4 kg      Telemetry    No adverse rhythm- Personally Reviewed  ECG    No new- Personally Reviewed  Physical Exam   GEN: No acute distress.   Neck: No JVD Cardiac: RRR, no murmurs, rubs, or gallops.  Respiratory: Clear to auscultation bilaterally. GI: Soft, nontender, non-distended  MS: Chronic lower extremity edema; No deformity. Neuro:  Nonfocal  Psych: Normal affect   Labs    High Sensitivity Troponin:   Recent Labs  Lab 12/08/22 1110 12/08/22 1255 12/20/22 0953 12/20/22 1142  TROPONINIHS '10 11 12 10     '$ Chemistry Recent Labs  Lab 12/22/22 0121 12/23/22 0052 12/26/22 0500 12/27/22 0553 12/28/22 0505  NA 142   < > 141 141 140  K 4.1   < > 4.5 4.3 4.4  CL 107   < > 104 104 104  CO2 25   < > '29 30 30  '$ GLUCOSE 78   < > 144* 111* 123*  BUN 28*   < > 38* 30* 33*  CREATININE 2.14*   < >  1.59* 1.44* 1.67*  CALCIUM 8.0*   < > 8.7* 8.5* 8.2*  MG 2.1  --   --   --   --   GFRNONAA 25*   < > 36* 41* 34*  ANIONGAP 10   < > '8 7 6   '$ < > = values in this interval not displayed.    Lipids No results for input(s): "CHOL", "TRIG", "HDL", "LABVLDL", "LDLCALC", "CHOLHDL" in the last 168 hours.  Hematology Recent Labs  Lab 12/26/22 0500 12/27/22 0553 12/28/22 0505  WBC 7.8 7.8 8.0  RBC 4.20 4.04 3.69*  HGB 11.7* 11.4* 10.4*  HCT 37.9 35.4* 33.6*  MCV 90.2 87.6 91.1  MCH 27.9 28.2 28.2  MCHC 30.9 32.2 31.0  RDW 14.9 14.8 15.0  PLT 193 177 175   Thyroid No results for input(s): "TSH", "FREET4" in the last 168 hours.  BNP Recent Labs  Lab 12/25/22 0500  BNP 528.2*    DDimer No results for input(s): "DDIMER" in the last 168 hours.   Radiology    No results found.    Patient Profile     67 y.o. female with issues below.  Assessment & Plan    Large pericardial effusion with tamponade physiology - Presented with worsening dyspnea lower extremity edema Lasix caused hypotension, echocardiogram on 12/20/2022 showed the large  effusion with RV concerns.  Attempted pericardiocentesis was not successful.  She had a pericardial window on 12/23/2022.  No malignant cells identified on pathology.  Stable.  Multivessel coronary artery disease - Heart team approach.  Surgery has seen, not a good candidate for CABG.  Received DES to LAD and RCA.  Circumflex was managed medically.  Lifelong Plavix per interventional cardiology.  Hyperlipidemia - Continue with high intensity statin Lipitor 80 mg daily.  LDL goal less than 70  Chronic systolic heart failure secondary to ischemic cardiomyopathy. - EF 30 to 35%.  We have reinitiated goal-directed medical therapy such as spironolactone and Jardiance.  Renal function has been a challenge.  AKI with chronic kidney disease stage IIIa - Baseline creatinine has been in the 1.4 range.  Was 2 on this admission.  Morbid obesity - BMI 63.  No new recommendations at this time.  We will go ahead and sign off.  We will see in follow-up.    For questions or updates, please contact [place] Please consult [email or web address] for contact info under        Signed, Candee Furbish, MD  12/28/2022, 10:56 AM

## 2022-12-29 DIAGNOSIS — I5043 Acute on chronic combined systolic (congestive) and diastolic (congestive) heart failure: Secondary | ICD-10-CM | POA: Diagnosis not present

## 2022-12-29 LAB — GLUCOSE, CAPILLARY
Glucose-Capillary: 155 mg/dL — ABNORMAL HIGH (ref 70–99)
Glucose-Capillary: 232 mg/dL — ABNORMAL HIGH (ref 70–99)
Glucose-Capillary: 155 mg/dL — ABNORMAL HIGH (ref 70–99)
Glucose-Capillary: 147 mg/dL — ABNORMAL HIGH (ref 70–99)

## 2022-12-29 LAB — BASIC METABOLIC PANEL
Anion gap: 7 (ref 5–15)
BUN: 32 mg/dL — ABNORMAL HIGH (ref 8–23)
CO2: 29 mmol/L (ref 22–32)
Calcium: 8.2 mg/dL — ABNORMAL LOW (ref 8.9–10.3)
Chloride: 104 mmol/L (ref 98–111)
Creatinine, Ser: 1.68 mg/dL — ABNORMAL HIGH (ref 0.44–1.00)
GFR, Estimated: 34 mL/min — ABNORMAL LOW (ref >60)
Glucose, Bld: 126 mg/dL — ABNORMAL HIGH (ref 70–99)
Potassium: 4.5 mmol/L (ref 3.5–5.1)
Sodium: 140 mmol/L (ref 135–145)

## 2022-12-29 MED ORDER — METOPROLOL SUCCINATE ER 25 MG PO TB24
25.0000 mg | ORAL_TABLET | Freq: Every day | ORAL | Status: DC
  Administered 2022-12-29 – 2023-01-02 (×5): 25 mg via ORAL
  Filled 2022-12-29 (×5): qty 1

## 2022-12-29 NOTE — Progress Notes (Signed)
Patient's new air/bari bed arrived and was swapped out for old bed.  Bed was tested to make sure it worked properly.  Patient was asked if she was ready to get in the bed and she stated "No. I'm not ready to get in the bed."

## 2022-12-29 NOTE — Progress Notes (Signed)
Physical Therapy Treatment Patient Details Name: Maureen Jackson MRN: [id number] DOB: 01-27-1956 Today's Date: 12/29/2022   History of Present Illness 67 y.o.female who was admitted on 1/10 with acute systolic CHF exacerbation with LVEF 30-35% and noted pericardial effusion that is concerning for possible tamponade.  On 1/12, cardiology attempted pericardiocentesis but unsuccessful.  CT surgery took patient on 1/13 for subxiphoid pericardial window with drainage of effusion as well as pericardial biopsy and placement of drain.  By 1/15, chest tube discontinued. PMH: morbid obesity,CAD,HTN,severe hypothyroidism,T2DM, CKD stage IIIa, chronic systolic and diastolic CHF with LVEF 83%, and chronic pericardial effusion    PT Comments    Pt is self limiting, declining any attempts to mobilize other than to pull herself to sit unsupported on edge of chair >15 min and declining to perform any therapeutic exercises or even assist in placing a theraband around her legs. Pt reports she is "tired" and does "not want to do anything". Extensive education provided on risk of further deconditioning and muscle atrophy if she does not mobilize or perform exercises, but pt still declining. Session focused on pt education and on providing pt with a HEP handout and instructing her on each exercise to reduce risk of muscle atrophy while here. She verbalized understanding. Current recommendations remain appropriate. Will continue to follow acutely.    Recommendations for follow up therapy are one component of a multi-disciplinary discharge planning process, led by the attending physician.  Recommendations may be updated based on patient status, additional functional criteria and insurance authorization.  Follow Up Recommendations  Skilled nursing-short term rehab (<3 hours/day) Can patient physically be transported by private vehicle: Yes   Assistance Recommended at Discharge Frequent or constant Supervision/Assistance   Patient can return home with the following A lot of help with bathing/dressing/bathroom;Assist for transportation;A little help with walking and/or transfers;Assistance with cooking/housework   Equipment Recommendations  Rolling walker (2 wheels);BSC/3in1;Hospital bed (bariatric)    Recommendations for Other Services       Precautions / Restrictions Precautions Precautions: Fall Restrictions Weight Bearing Restrictions: No     Mobility  Bed Mobility               General bed mobility comments: Sitting in chair upon arrival. Pt able to lift trunk off back of chair to sit up edge of chair unsupported by using hands on arm rests to pull trunk forwards.    Transfers                   General transfer comment: Pt declined despite max encouragement    Ambulation/Gait               General Gait Details: Pt declined despite max encouragement   Stairs             Wheelchair Mobility    Modified Rankin (Stroke Patients Only)       Balance Overall balance assessment: Needs assistance Sitting-balance support: No upper extremity supported, Feet supported Sitting balance-Leahy Scale: Fair Sitting balance - Comments: Static sitting edge of chair without UE support > 15 min, no LOB       Standing balance comment: Pt declined despite max encouragement                            Cognition Arousal/Alertness: Awake/alert Behavior During Therapy: WFL for tasks assessed/performed Overall Cognitive Status: Within Functional Limits for tasks assessed  General Comments: Needs encouragement to participate. Poor insight into her need to mobilize to improve, stating she is not improving much but is also not actively trying to mobilize except when encouraged        Exercises      General Comments General comments (skin integrity, edema, etc.): extensive education and encouragement provided to have  pt try to stand or perform any exercises to prevent muscle atrophy and further deconditioning, but pt resistant stating she "is not going to do anything", even just x1 hip abduction or LAQ; provided pt with blue and red therabands and MedBridge HEP handout with Access Code: [email or web address], and demonstrated exercises to pt. Pt refused to attempt any exercise or assist in placement of therabands on her legs; she verbalized understanding of exercises; pt has been in chair since yesterday due to bed malfunction, nursing working on getting new bed currently      Pertinent Vitals/Pain Pain Assessment Pain Assessment: Faces Faces Pain Scale: No hurt Pain Intervention(s): Monitored during session    Home Living                          Prior Function            PT Goals (current goals can now be found in the care plan section) Acute Rehab PT Goals Patient Stated Goal: to rest PT Goal Formulation: With patient Time For Goal Achievement: 01/09/23 Potential to Achieve Goals: Fair Progress towards PT goals: Not progressing toward goals - comment (pt declining any attempts to mobilize other than sit up edge of chair and declining any exercises)    Frequency    Min 2X/week      PT Plan Current plan remains appropriate    Co-evaluation              AM-PAC PT "6 Clicks" Mobility   Outcome Measure  Help needed turning from your back to your side while in a flat bed without using bedrails?: A Little Help needed moving from lying on your back to sitting on the side of a flat bed without using bedrails?: A Little Help needed moving to and from a bed to a chair (including a wheelchair)?: A Little Help needed standing up from a chair using your arms (e.g., wheelchair or bedside chair)?: A Little Help needed to walk in hospital room?: A Little Help needed climbing 3-5 steps with a railing? : Total 6 Click Score: 16    End of Session   Activity Tolerance: Other (comment) (self  limiting) Patient left: in chair;with call bell/phone within reach;with nursing/sitter in room   PT Visit Diagnosis: Muscle weakness (generalized) (M62.81);Other abnormalities of gait and mobility (R26.89);Unsteadiness on feet (R26.81);Difficulty in walking, not elsewhere classified (R26.2)     Time: 7342-8768 PT Time Calculation (min) (ACUTE ONLY): 20 min  Charges:  $Therapeutic Activity: 8-22 mins                     Moishe Spice, PT, DPT Acute Rehabilitation Services  Office: [phone]    Orvan Falconer 12/29/2022, 9:00 AM

## 2022-12-29 NOTE — TOC Progression Note (Signed)
Transition of Care [facility]) - Progression Note    Patient Details  Name: Maureen Jackson MRN: [id number] Date of Birth: 02/07/1956  Transition [facility]) CM/SW Contact  Vinie Sill, [place] Phone Number: 12/29/2022, 10:54 AM  Clinical Narrative:     Received insurance Josem Kaufmann [id number] reference # [id number] 1/19-1/23-  Called [facility] - SNF reports no female bed today- she will follow up today if bed becomes available.   TOC will continue to follow and assist with discharge planning.  Thurmond Butts, MSW, LCSW Clinical Social Worker    Expected Discharge Plan: Skilled Nursing Facility Barriers to Discharge: Ship broker, Continued Medical Work up, SNF Pending bed offer  Expected Discharge Plan and Services In-house Referral: Clinical Social Work Discharge Planning Services: CM Consult Post Acute Care Choice: Durable Medical Equipment Living arrangements for the past 2 months: Apartment                                       Social Determinants of Health (SDOH) Interventions SDOH Screenings   Food Insecurity: No Food Insecurity (12/26/2022)  Housing: Low Risk  (12/22/2022)  Transportation Needs: No Transportation Needs (12/22/2022)  Utilities: Not At Risk (12/26/2022)  Alcohol Screen: Low Risk  (12/22/2022)  Depression (PHQ2-9): Low Risk  (01/01/2020)  Financial Resource Strain: Low Risk  (12/22/2022)  Tobacco Use: Medium Risk (12/25/2022)    Readmission Risk Interventions    12/21/2022    9:38 AM 05/19/2020    8:46 AM  Readmission Risk Prevention Plan  Transportation Screening Complete Complete  PCP or Specialist Appt within 3-5 Days  Not Complete  Home Care Screening Complete   Medication Review (RN CM) Complete   HRI or Home Care Consult  Complete  Social Work Consult for [place] Planning/Counseling  Complete  Palliative Care Screening  Not Applicable  Medication Review Press photographer)  Complete

## 2022-12-29 NOTE — Progress Notes (Signed)
   Heart Failure Stewardship Pharmacist Progress Note   PCP: Neale Burly, MD PCP-Cardiologist: Carlyle Dolly, MD    HPI:  67 yo F with PMH of CHF, CAD, HTN, glaucoma, hypothyroidism, OSA, T2DM, obesity, and arthritis.   Known ischemic cardiomyopathy since at least 2018. EF at that time was 30-35% and multivessel CAD on cath. Received 2 stents to circumflex. EF has remained in the 30-40% range since then.   She presented to the ED on 1/10 with shortness of breath and LE edema. Denies orthopnea. CXR with no acute cardiopulmonary disease and stable cardiomegaly. CTA showed large pericardial effusion and trace bilateral pleural fluid. ECHO 1/12 showed LVEF 30-35%, RV normal, findings suspicious for cardiac tamponade. Transferred to [facility] for repeat ECHO and possible pericardiocentesis. Attempt at pericardiocentesis unsuccessful. Pericardial window by CT surgery done 1/13.  Current HF Medications: MRA: spironolactone 12.5 mg daily SGLT2i: Jardiance 25 mg daily  Prior to admission HF Medications: Diuretic: furosemide 40 mg daily Beta blocker: carvedilol 12.5 mg BID ACE/ARB/ARNI: Entresto 97/103 mg BID *not taking Jardiance or spironolactone   Pertinent Lab Values: Serum creatinine 1.67, BUN 33, Potassium 4.4, Sodium 140, BNP 74, Magnesium 2.1, A1c 7.2   Vital Signs: Weight: 350 lbs (admission weight: 352 lbs) Blood pressure: 110-130/60s  Heart rate: 70-80s  I/O: not well documented yesterday; net -3.5L  Medication Assistance / Insurance Benefits Check: Does the patient have prescription insurance?  Yes Type of insurance plan: [facility] Medicare  Outpatient Pharmacy:  [facility] to [facility] Is the patient willing to use [facility] at discharge? Yes Is the patient willing to transition their outpatient pharmacy to utilize a [facility] outpatient pharmacy?   Pending    Assessment: 1. Acute on chronic systolic CHF (LVEF 25-42%), due to ICM. NYHA  class II symptoms. - Continue spironolactone 12.5 mg daily and Jardiance 25 mg daily today - Consider restarting BB for HFrEF - BP and volume improved   Plan: 1) Medication changes recommended at this time: - Restart carvedilol 12.5 mg BID  2) Patient assistance: - None pending  3)  Education  - Patient has been educated on current HF medications and potential additions to HF medication regimen - Patient verbalizes understanding that over the next few months, these medication doses may change and more medications may be added to optimize HF regimen - Patient has been educated on basic disease state pathophysiology and goals of therapy   Kerby Nora, PharmD, BCPS Heart Failure Stewardship Pharmacist Phone [phone]

## 2022-12-29 NOTE — Progress Notes (Signed)
Patient sat in recliner most of the night. Pt wasn't ready to get back in bed until after MD. When pt got back in bed, upper part of her bed was not inflating. Pt stated she is sinking in bed. Called portable and new bed ordered. When new bed arrived, head of the bed was not going up or down. Another bed was ordered but unfortunately wasn't bariatric bed. Portable tried to fix the bed in the room but was unsuccessful. Still awaiting bed, pt is very upset. Sitting in the recliner. Reported to day shift. Plan of care continues.

## 2022-12-29 NOTE — Progress Notes (Signed)
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  She was seen by cardiology and started on IV diuresis. Diuresis and antihypertensives held on 1/11 due to softer blood pressure readings.  On  1/12, cardiology attempted attempted pericardiocentesis but unsuccessful.  CT surgery took patient on 1/13 for subxiphoid pericardial window with drainage of effusion as well as pericardial biopsy and placement of drain.  By 1/15, chest tube discontinued. GDMT has been adjusted by cardiology with Aldactone, Jardiance.  Given elevated creatinine Lasix is on Hold, PT OT has recommended skilled nursing facility.  At this time patient medically stable discharge currently has signed off, will need close monitoring of renal function Creatinine had somewhat improved following pericardial fluid drainage.  Cardiology has adjusted GDMT now on Jardiance, Aldactone. PT OT has worked with her and recommending skilled nursing facility.  Subjective: Seen and examined this morning resting in the bedside chair comfortable pleasant.   Nursing reports the bed has broken and responsible person has been notified  Creatinine remains elevated at 1.6 holding there and stable although elevated  Assessment and Plan: Principal Problem:   Acute on chronic combined systolic and diastolic CHF (congestive heart failure) ([place]) Active Problems:   Hypothyroidism   Essential hypertension, benign   Morbid obesity/BMI > 55   DM type 2 causing vascular disease (HCC)   Pericardial effusion   Coronary artery disease involving native coronary artery  of native heart without angina pectoris   Ischemic cardiomyopathy   Cardiac tamponade  Acute on chronic systolic congestive heart failure Large pericardial effusion with dyspnea worsening past 2 weeks> s/p pericardial window 1/13: pericardiocentesis attempted  1/12 unsuccessful s/p pericardial window with drain placement and biopsy 1/13 by CT surgery.  Drain has been discontinued 1/15 doing well on room air.  Creatinine remains elevated at 1.6 but not worsening.  Cardiogy adjusted GDMT with Jardiance 1/17, Aldactone, added metoprolol 1/19. Remains on room air.  Continue to monitor intake output Daily weight and renal functionsNet IO Since Admission: -3,527 mL [12/29/22 1047]  Filed Weights   12/26/22 0400 12/28/22 0500 12/29/22 0239  Weight: (!) 178.1 kg (!) 158.7 kg (!) 159 kg     Ischemic cardiomyopathy/CAD-DES to LAD and RCA 2018/PAD Hyperretension Hyperlipidemia: BP is well-controlled.  No chest pain.Continue Aldactone, Lipitor, Plavix.  Resuming metoprolol  AKI on stage IIIa CKD: Creatinine remains elevated holding at 1.6 but not worse.  Has peaked up to 2.17.  Tolerating current cardiac meds will need close follow-up outpatient or at [facility] Recent Labs    12/20/22 0953 12/21/22 0435 12/22/22 0121 12/23/22 0052 12/24/22 0510 12/25/22 0500 12/26/22 0500 12/27/22 0553 12/28/22 0505 12/29/22 0310  BUN 21 24* 28* 30* 38* 45* 38* 30* 33* 32*  CREATININE 1.47* 1.64* 2.14* 1.99* 2.17* 2.13* 1.59* 1.44* 1.67* 1.68*    Severe hypothyroidism TSH 130 continue levothyroxine as ordered,followed by endocrinology Dr. Dorris Fetch, remote history of thyroidectomy for benign goiter  Type 2 diabetes mellitus A1c 7.3 on Tresiba 50 units at nighttime NovoLog 10 units 3 times daily at home.  Blood sugar well-controlled on currentSemglee 10 units bedtime, NovoLog to 3 units 3 times daily and SSI: cont to monitor and adjust  Recent Labs  Lab 12/28/22 0617 12/28/22 1055 12/28/22 1640 12/28/22 2139  12/29/22 0629  GLUCAP 117* 161* 130* 131* 155*     Morbid Obesity:Patient's Body mass index is 56.6 kg/m. : Will benefit with PCP follow-up, weight loss  healthy lifestyle OSA patient noncompliant with CPAP. Deconditioning/debility: Continue PT OT continue to mobilize.  Plan is for skilled nursing facility once bed available  DVT prophylaxis: heparin injection 5,000 Units Start: 12/20/22 1400 Code Status:   Code Status: Full Code Family Communication: plan of care discussed with patient at bedside. I had updated patient's sister previously and also daughter.  Patient status is: inpatient because of pericardial efffusion  Level of care: Telemetry Cardiac  Dispo: The patient is from: Home            Anticipated disposition: Waiting for skilled nursing facility.  He remains medically stable.    Objective: Vitals last 24 hrs: Vitals:   12/29/22 0002 12/29/22 0239 12/29/22 0439 12/29/22 0831  BP: 132/83  99/66 113/65  Pulse: 77  80 82  Resp: '18  19 20  '$ Temp: 98.1 F (36.7 C)  98.1 F (36.7 C) 98.2 F (36.8 C)  TempSrc: Oral  Oral Oral  SpO2: 99%  94% 97%  Weight:  (!) 159 kg    Height:       Weight change: 0.286 kg  Physical Examination: General exam: AAOX3, weak,older appearing HEENT:Oral mucosa moist, Ear/Nose WNL grossly, dentition normal. Respiratory system: bilaterally CLEAR BS,no use of accessory muscle Cardiovascular system: S1 & S2 +, regular rate.  The site of pericardial window is glued, dry no drainage Gastrointestinal system: Abdomen soft,NT,ND,BS+ Nervous System:Alert, awake, moving extremities and grossly nonfocal Extremities: LE ankle edema /chronic lymphedema, lower extremities warm Skin: No rashes,no icterus. DJM:EQASTM muscle bulk,tone, power   Medications reviewed:  Scheduled Meds:  atorvastatin  80 mg Oral QPM   brimonidine  1 drop Both Eyes BID   And   timolol  1 drop Both Eyes BID   calcium carbonate  4 tablet Oral QPM   Chlorhexidine  Gluconate Cloth  6 each Topical Daily   clopidogrel  75 mg Oral Daily   empagliflozin  25 mg Oral Daily   heparin  5,000 Units Subcutaneous Q8H   insulin aspart  0-15 Units Subcutaneous TID WC   insulin aspart  0-5 Units Subcutaneous QHS   insulin aspart  3 Units Subcutaneous TID WC   insulin glargine-yfgn  10 Units Subcutaneous QHS   levothyroxine  200 mcg Oral QAC breakfast   levothyroxine  50 mcg Oral QAC breakfast   loratadine  10 mg Oral Daily   metoprolol succinate  25 mg Oral Daily   pantoprazole  40 mg Oral Daily   polyethylene glycol  17 g Oral Daily   senna-docusate  2 tablet Oral BID   sodium chloride flush  3 mL Intravenous Q12H   spironolactone  12.5 mg Oral Daily   Continuous Infusions:  sodium chloride 10 mL/hr at 12/22/22 1313   Diet Order             Diet heart healthy/carb modified Room service appropriate? Yes with Assist; Fluid consistency: Thin  Diet effective now                  Intake/Output Summary (Last 24 hours) at 12/29/2022 1047 Last data filed at 12/29/2022 0241 Gross per 24 hour  Intake 480 ml  Output 500 ml  Net -20 ml   Net IO Since Admission: -3,527  mL [12/29/22 1047]  Wt Readings from Last 3 Encounters:  12/29/22 (!) 159 kg  12/08/22 (!) 161.5 kg  07/26/22 (!) 161.5 kg    Unresulted Labs (From admission, onward)     Start     Ordered   12/29/22 7169  Basic metabolic panel  Daily,   R     Question:  Specimen collection method  Answer:  Unit=Unit collect   12/28/22 1152   12/23/22 0853  Acid Fast Culture with reflexed sensitivities  RELEASE UPON ORDERING,   STAT       Comments: Specimen B: Phone [phone]         Previous Biopsy:  no Is the patient on airborne/droplet precautions? No           Clinical History:  pericardial fluid Copy of Report to:  Dr. Lavonna Monarch Specimen Disposition: MICRO     12/23/22 0853          Data Reviewed: I have personally reviewed following labs and imaging studies CBC: Recent Labs  Lab  12/24/22 0510 12/25/22 0500 12/26/22 0500 12/27/22 0553 12/28/22 0505  WBC 11.2* 8.8 7.8 7.8 8.0  HGB 11.4* 10.7* 11.7* 11.4* 10.4*  HCT 36.0 35.5* 37.9 35.4* 33.6*  MCV 89.8 92.0 90.2 87.6 91.1  PLT 189 188 193 177 017    Basic Metabolic Panel: Recent Labs  Lab 12/25/22 0500 12/26/22 0500 12/27/22 0553 12/28/22 0505 12/29/22 0310  NA 139 141 141 140 140  K 4.3 4.5 4.3 4.4 4.5  CL 105 104 104 104 104  CO2 '29 29 30 30 29  '$ GLUCOSE 122* 144* 111* 123* 126*  BUN 45* 38* 30* 33* 32*  CREATININE 2.13* 1.59* 1.44* 1.67* 1.68*  CALCIUM 7.9* 8.7* 8.5* 8.2* 8.2*    Recent Results (from the past 240 hour(s))  Resp panel by RT-PCR (RSV, Flu A&B, Covid) Anterior Nasal Swab     Status: None   Collection Time: 12/20/22 10:11 AM   Specimen: Anterior Nasal Swab  Result Value Ref Range Status   SARS Coronavirus 2 by RT PCR NEGATIVE NEGATIVE Final    Comment: (NOTE) SARS-CoV-2 target nucleic acids are NOT DETECTED.  The SARS-CoV-2 RNA is generally detectable in upper respiratory specimens during the acute phase of infection. The lowest concentration of SARS-CoV-2 viral copies this assay can detect is 138 copies/mL. A negative result does not preclude SARS-Cov-2 infection and should not be used as the sole basis for treatment or other patient management decisions. A negative result may occur with  improper specimen collection/handling, submission of specimen other than nasopharyngeal swab, presence of viral mutation(s) within the areas targeted by this assay, and inadequate number of viral copies(<138 copies/mL). A negative result must be combined with clinical observations, patient history, and epidemiological information. The expected result is Negative.  Fact Sheet for Patients:  EntrepreneurPulse.com.au  Fact Sheet for Healthcare Providers:  IncredibleEmployment.be  This test is no t yet approved or cleared by the [place] FDA and   has been authorized for detection and/or diagnosis of SARS-CoV-2 by FDA under an Emergency Use Authorization (EUA). This EUA will remain  in effect (meaning this test can be used) for the duration of the COVID-19 declaration under Section 564(b)(1) of the Act, 21 U.S.C.section 360bbb-3(b)(1), unless the authorization is terminated  or revoked sooner.       Influenza A by PCR NEGATIVE NEGATIVE Final   Influenza B by PCR NEGATIVE NEGATIVE Final    Comment: (NOTE) The Xpert Xpress SARS-CoV-2/FLU/RSV plus assay  is intended as an aid in the diagnosis of influenza from Nasopharyngeal swab specimens and should not be used as a sole basis for treatment. Nasal washings and aspirates are unacceptable for Xpert Xpress SARS-CoV-2/FLU/RSV testing.  Fact Sheet for Patients: EntrepreneurPulse.com.au  Fact Sheet for Healthcare Providers: IncredibleEmployment.be  This test is not yet approved or cleared by the [place] FDA and has been authorized for detection and/or diagnosis of SARS-CoV-2 by FDA under an Emergency Use Authorization (EUA). This EUA will remain in effect (meaning this test can be used) for the duration of the COVID-19 declaration under Section 564(b)(1) of the Act, 21 U.S.C. section 360bbb-3(b)(1), unless the authorization is terminated or revoked.     Resp Syncytial Virus by PCR NEGATIVE NEGATIVE Final    Comment: (NOTE) Fact Sheet for Patients: EntrepreneurPulse.com.au  Fact Sheet for Healthcare Providers: IncredibleEmployment.be  This test is not yet approved or cleared by the [place] FDA and has been authorized for detection and/or diagnosis of SARS-CoV-2 by FDA under an Emergency Use Authorization (EUA). This EUA will remain in effect (meaning this test can be used) for the duration of the COVID-19 declaration under Section 564(b)(1) of the Act, 21 U.S.C. section 360bbb-3(b)(1),  unless the authorization is terminated or revoked.  Performed at [facility], [address], [place], [place] [address]   Surgical pcr screen     Status: None   Collection Time: 12/23/22  7:12 AM   Specimen: Nasal Mucosa; Nasal Swab  Result Value Ref Range Status   MRSA, PCR NEGATIVE NEGATIVE Final   Staphylococcus aureus NEGATIVE NEGATIVE Final    Comment: (NOTE) The Xpert SA Assay (FDA approved for NASAL specimens in patients 29 years of age and older), is one component of a comprehensive surveillance program. It is not intended to diagnose infection nor to guide or monitor treatment. Performed at [facility], [place] [address], [place], [place] [address]   Aerobic/Anaerobic Culture w Gram Stain (surgical/deep wound)     Status: None   Collection Time: 12/23/22  8:52 AM   Specimen: PATH Cytology Misc. fluid; Body Fluid  Result Value Ref Range Status   Specimen Description PERITONEAL  Final   Special Requests NONE  Final   Gram Stain NO ORGANISMS SEEN NO WBC SEEN   Final   Culture   Final    No growth aerobically or anaerobically. Performed at [facility], [place] [address], [place], [place] [address]    Report Status 12/28/2022 FINAL  Final  Fungus Stain     Status: None   Collection Time: 12/23/22  8:52 AM   Specimen: PATH Cytology Misc. fluid; Body Fluid  Result Value Ref Range Status   FUNGUS STAIN Final report  Final    Comment: (NOTE) Performed At: [facility] [address] [place], [place] [id number] Rush Farmer MD OZ:[id number]    Fungal Source PERITONEAL  Final    Comment: Performed at [facility], [place] [address] [place], [place] [address]  Acid Fast Smear (AFB)     Status: None   Collection Time: 12/23/22  8:52 AM   Specimen: PATH Cytology Misc. fluid; Body Fluid  Result Value Ref Range Status   AFB Specimen Processing Concentration  Final   Acid Fast Smear Negative  Final    Comment: (NOTE) Performed At: [facility] [place], [place] [id number] Rush Farmer MD UU:[id number]    Source (AFB) PERITONEAL  Final    Comment: Performed at  [facility], [place] [address] [place], [place] [address]  Fungal Stain reflex     Status: None   Collection Time: 12/23/22  8:52 AM  Result Value Ref Range Status   Fungal stain result 1 Comment  Final    Comment: (NOTE) KOH/Calcofluor preparation:  no fungus observed. A duplicate report has been generated due to demographic updates. Performed At: [facility] [place], [place] [id number] Rush Farmer MD [id number]   Antimicrobials: Anti-infectives (From admission, onward)    None     Culture/Microbiology    Component Value Date/Time   SDES PERITONEAL 12/23/2022 0852   SPECREQUEST NONE 12/23/2022 0852   CULT  12/23/2022 0852    No growth aerobically or anaerobically. Performed at [facility], [place] [address], [place], [place] [address]    REPTSTATUS 12/28/2022 FINAL 12/23/2022 9753   Other culture-see note  Radiology Studies: No results found.  LOS: 9 days  Antonieta Pert, MD [facility]  12/29/2022, 10:47 AM

## 2022-12-29 NOTE — Care Management Important Message (Signed)
Important Message  Patient Details  Name: Maureen Jackson MRN: [id number] Date of Birth: 1956-04-21   Medicare Important Message Given:  Yes     Shelda Altes 12/29/2022, 11:41 AM

## 2022-12-29 NOTE — Progress Notes (Addendum)
   We will go ahead and start Toprol-XL 25 mg once a day for her chronic systolic heart failure. She used to be on carvedilol at home.  Perhaps once a day dosing will be of benefit to her.  Discussed with her.  She appears comfortable sitting beside her bed.  She is writing thank you notes for the nurses.  Continue to titrate as outpatient.  Candee Furbish, MD

## 2022-12-30 DIAGNOSIS — I5043 Acute on chronic combined systolic (congestive) and diastolic (congestive) heart failure: Secondary | ICD-10-CM | POA: Diagnosis not present

## 2022-12-30 LAB — GLUCOSE, CAPILLARY
Glucose-Capillary: 102 mg/dL — ABNORMAL HIGH (ref 70–99)
Glucose-Capillary: 157 mg/dL — ABNORMAL HIGH (ref 70–99)
Glucose-Capillary: 122 mg/dL — ABNORMAL HIGH (ref 70–99)
Glucose-Capillary: 125 mg/dL — ABNORMAL HIGH (ref 70–99)

## 2022-12-30 LAB — BASIC METABOLIC PANEL
Anion gap: 9 (ref 5–15)
BUN: 30 mg/dL — ABNORMAL HIGH (ref 8–23)
CO2: 26 mmol/L (ref 22–32)
Calcium: 8 mg/dL — ABNORMAL LOW (ref 8.9–10.3)
Chloride: 103 mmol/L (ref 98–111)
Creatinine, Ser: 1.51 mg/dL — ABNORMAL HIGH (ref 0.44–1.00)
GFR, Estimated: 38 mL/min — ABNORMAL LOW (ref >60)
Glucose, Bld: 97 mg/dL (ref 70–99)
Potassium: 4.3 mmol/L (ref 3.5–5.1)
Sodium: 138 mmol/L (ref 135–145)

## 2022-12-30 MED ORDER — SACUBITRIL-VALSARTAN 24-26 MG PO TABS
1.0000 | ORAL_TABLET | Freq: Two times a day (BID) | ORAL | Status: DC
  Administered 2022-12-31 – 2023-01-02 (×6): 1 via ORAL
  Filled 2022-12-30 (×6): qty 1

## 2022-12-30 MED ORDER — FUROSEMIDE 40 MG PO TABS: 40.0000 mg | ORAL_TABLET | Freq: Every day | ORAL | Status: DC

## 2022-12-30 NOTE — Progress Notes (Signed)
Progress Note   Patient: Maureen Jackson KDT:[id number] DOB: 1956/08/29 DOA: 12/20/2022     10 DOS: the patient was seen and examined on 12/30/2022   Brief hospital course: 67 year old female with PMH of Hypertension, Diabetes Mellitus Type 2, Peripheral Vascular Disease, Multivessel Coronary Artery Disease s/p 2018 DES, Combined Diastolic/Systolic Heart Failure with Ischemic Cardiomyopathy and EF 30-35%, Chronic Kidney Disease Stage 3, and Morbid Obesity (56 kg/m2), and OSA who presented to the ED on 1/10 with acute dyspnea found to have an enlarging pericardial effusion s/p 1/12 unsuccessful pericardiocentesis and 1/13 pericardial window with drain placement.  On 1/15, drain was removed.  PT/OT recommends SNF.  Patient is medically stable for discharge and waiting placement.  Assessment and Plan: Pericardial Effusion s/p 1/13 Pericardial Window and drain placement and 1/15 drain removal: - Patient is asymptomatic.  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 - Given hypotension reported early in admission, start home Entresto at lower dose in the morning. - Restart Lasix 40 mg daily in the morning if Creatinine is stable.  Acute Kidney Injury on CKD3, pre-renal: - Creatinine is 1.51 mg/dL consistent with baseline.  Coronary Artery Disease s/p 2018 DES to LAD and RCA: - Plavix and Statin.  Hypertension: - BP is stable.  Hyperlipidemia:  - Statin.  Diabetes Mellitus Type 2: Glucose is stable. - Continue regimen as is.  Hypothyroidism: - Home Synthroid.  Morbid Obesity: BMI 56.6 kg/m2: - Continue home Ozempic. - Counseled on weight loss through diet and lifestyle modifications.  Physical Deconditioning: - PT/OT recommends SNF on discharge.   Subjective:  Maureen Jackson is resting comfortably this morning. She denies any chest pain or shortness of  breath. We discussed her plan of care in detail. We discussed all of her medications as well.  Physical Exam: Vitals:   12/30/22 0844 12/30/22 1241 12/30/22 1245 12/30/22 1730  BP: 124/65 134/72  126/83  Pulse: 70 72  72  Resp: 19 (!) 72 18 20  Temp: 97.8 F (36.6 C) 98.3 F (36.8 C)  98.1 F (36.7 C)  TempSrc: Oral Oral  Oral  SpO2: 97% 97%  98%  Weight:      Height:       Physical Exam Constitutional:      General: She is not in acute distress.    Appearance: She is obese.  HENT:     Head: Normocephalic and atraumatic.  Eyes:     Extraocular Movements: Extraocular movements intact.     Pupils: Pupils are equal, round, and reactive to light.  Cardiovascular:     Rate and Rhythm: Normal rate and regular rhythm.     Pulses: Normal pulses.     Heart sounds: Normal heart sounds.  Pulmonary:     Effort: Pulmonary effort is normal.     Breath sounds: Normal breath sounds.  Chest:     Chest wall: No tenderness.  Abdominal:     Palpations: Abdomen is soft.     Tenderness: There is no abdominal tenderness.  Skin:    General: Skin is warm and dry.     Capillary Refill: Capillary refill takes less than 2 seconds.  Neurological:     General: No focal deficit present.  Psychiatric:        Mood and Affect: Mood normal.     Data Reviewed: CARDIAC CATHETERIZATION 1.  Unsuccessful pericardiocentesis and pericardial drain  placement due to  very protuberant abdomen; echocardiography on site could not identify an  apical area for needle placement.  Recommendation: Consider pericardial window.   DVT: Heparin Diet: Cardiac/Diabetic  Family Communication: Spoke with patient and family at bedside.  Disposition: Status is: Inpatient Remains inpatient appropriate because: management of CHF Planned Discharge Destination: Skilled nursing facility  Time spent: 60 minutes  Author: George Hugh, MD 12/30/2022 5:49 PM  For on call review [email or web address].

## 2022-12-30 NOTE — Progress Notes (Signed)
Pt refused to stand for her daily weight, pt encouraged and explained the need for her weight, pt still refused

## 2022-12-31 DIAGNOSIS — I5043 Acute on chronic combined systolic (congestive) and diastolic (congestive) heart failure: Secondary | ICD-10-CM | POA: Diagnosis not present

## 2022-12-31 LAB — BASIC METABOLIC PANEL
Anion gap: 6 (ref 5–15)
BUN: 29 mg/dL — ABNORMAL HIGH (ref 8–23)
CO2: 29 mmol/L (ref 22–32)
Calcium: 8 mg/dL — ABNORMAL LOW (ref 8.9–10.3)
Chloride: 104 mmol/L (ref 98–111)
Creatinine, Ser: 1.62 mg/dL — ABNORMAL HIGH (ref 0.44–1.00)
GFR, Estimated: 35 mL/min — ABNORMAL LOW (ref >60)
Glucose, Bld: 117 mg/dL — ABNORMAL HIGH (ref 70–99)
Potassium: 5.1 mmol/L (ref 3.5–5.1)
Sodium: 139 mmol/L (ref 135–145)

## 2022-12-31 LAB — GLUCOSE, CAPILLARY
Glucose-Capillary: 119 mg/dL — ABNORMAL HIGH (ref 70–99)
Glucose-Capillary: 168 mg/dL — ABNORMAL HIGH (ref 70–99)
Glucose-Capillary: 104 mg/dL — ABNORMAL HIGH (ref 70–99)
Glucose-Capillary: 78 mg/dL (ref 70–99)

## 2022-12-31 LAB — URINALYSIS, ROUTINE W REFLEX MICROSCOPIC
Bacteria, UA: NONE SEEN
Bilirubin Urine: NEGATIVE
Glucose, UA: >=500 mg/dL — AB
Hgb urine dipstick: NEGATIVE
Ketones, ur: NEGATIVE mg/dL
Leukocytes,Ua: NEGATIVE
Nitrite: NEGATIVE
Protein, ur: NEGATIVE mg/dL
Specific Gravity, Urine: 1.013 (ref 1.005–1.030)
pH: 5 (ref 5.0–8.0)

## 2022-12-31 LAB — CBC
HCT: 30.2 % — ABNORMAL LOW (ref 36.0–46.0)
Hemoglobin: 10 g/dL — ABNORMAL LOW (ref 12.0–15.0)
MCH: 29.3 pg (ref 26.0–34.0)
MCHC: 33.1 g/dL (ref 30.0–36.0)
MCV: 88.6 fL (ref 80.0–100.0)
Platelets: 216 10*3/uL (ref 150–400)
RBC: 3.41 MIL/uL — ABNORMAL LOW (ref 3.87–5.11)
RDW: 15.2 % (ref 11.5–15.5)
WBC: 7.5 10*3/uL (ref 4.0–10.5)
nRBC: 0 % (ref 0.0–0.2)

## 2022-12-31 MED ORDER — FUROSEMIDE 40 MG PO TABS: 40.0000 mg | ORAL_TABLET | Freq: Every day | ORAL | Status: DC

## 2022-12-31 NOTE — Progress Notes (Signed)
Mobility Specialist Progress Note:   12/31/22 0940  Mobility  Activity Ambulated with assistance in room;Transferred from bed to chair  Level of Assistance Minimal assist, patient does 75% or more  Assistive Device Front wheel walker  Distance Ambulated (ft) 2 ft  Activity Response Tolerated well  $Mobility charge 1 Mobility   Pt in bed willing to participate in mobility. No complaints of pain. MinA to stand. Left in chair with call bell in reach and all needs met.   Gareth Eagle Janequa Kipnis Mobility Specialist Please contact via [employer] or  Rehab Office at [phone]

## 2022-12-31 NOTE — TOC Progression Note (Signed)
Transition of Care [facility]) - Progression Note    Patient Details  Name: Maureen Jackson MRN: [id number] Date of Birth: March 06, 1956  Transition of Care [facility]) CM/SW [place], LCSW Phone Number: 12/31/2022, 12:39 PM  Clinical Narrative:     CSW attempted to call Debbie at [facility] and had to leave VM.  TOC team will continue to assist with discharge planning needs.   Expected Discharge Plan: [place] Barriers to Discharge: Ship broker, Continued Medical Work up, SNF Pending bed offer  Expected Discharge Plan and Services In-house Referral: Clinical Social Work Discharge Planning Services: CM Consult Post Acute Care Choice: Durable Medical Equipment Living arrangements for the past 2 months: Apartment                                       Social Determinants of Health (SDOH) Interventions SDOH Screenings   Food Insecurity: No Food Insecurity (12/26/2022)  Housing: Low Risk  (12/22/2022)  Transportation Needs: No Transportation Needs (12/22/2022)  Utilities: Not At Risk (12/26/2022)  Alcohol Screen: Low Risk  (12/22/2022)  Depression (PHQ2-9): Low Risk  (01/01/2020)  Financial Resource Strain: Low Risk  (12/22/2022)  Tobacco Use: Medium Risk (12/25/2022)    Readmission Risk Interventions   Row Labels 12/21/2022    9:38 AM 05/19/2020    8:46 AM  Readmission Risk Prevention Plan   Section Header. No data exists in this row.    Transportation Screening   Complete Complete  PCP or Specialist Appt within 3-5 Days    Not Complete  Home Care Screening   Complete   Medication Review (RN CM)   Complete   HRI or Home Care Consult    Complete  Social Work Consult for [place] Planning/Counseling    Complete  Palliative Care Screening    Not Applicable  Medication Review Press photographer)    Complete

## 2022-12-31 NOTE — Progress Notes (Signed)
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  On 1/15, drain was removed.  PT/OT recommends SNF.  Patient is medically stable for discharge and waiting placement.  Assessment and Plan: Acute Abdominal Pain: - Check Abdominal CT.  Acute Dysuria: - Check Urinalysis.  Pericardial Effusion s/p 1/13 Pericardial Window and drain placement and 1/15 drain removal: - Patient is asymptomatic.  Restart Lasix once renal function stabilizes.  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 - Given hypotension early in admission, we will continue Entresto at lower than home dose. - Restart Lasix 40 mg daily in the morning if Creatinine is stable.  Acute Kidney Injury on CKD3, pre-renal: - Creatinine increased slightly from 1.5 to 1.6 mg/dL.  Monitor BMPs.   12/27/22 12/28/22 12/29/22 12/30/22 12/31/22  Creatinine 1.44 (H) 1.67 (H) 1.68 (H) 1.51 (H) 1.62 (H)   Coronary Artery Disease s/p 2018 DES to LAD and RCA: - Plavix and Statin.  Hypertension: - BP is stable.  Hyperlipidemia:  - Statin.  Diabetes Mellitus Type 2: Glucose is stable. - Continue regimen.  No changes.  Hypothyroidism: - Home Synthroid.  Morbid Obesity: BMI 56.6 kg/m2: - Continue home  Ozempic. - Counseled on weight loss through diet and lifestyle modifications.  Physical Deconditioning: - PT/OT recommends SNF on discharge.  Discuss SNF options with Case Management on Monday.   Subjective:  [name] complains of nausea and abdominal pain. We will check Abdominal CT. She also some endorses some dysuria. We will check Urinalysis. She has no fevers and blood pressure is stable. She denies any chest pain or shortness of breath.  Physical Exam: Vitals:   12/30/22 2325 12/31/22 1002 12/31/22 1425 12/31/22 1531  BP: (!) 139/127 121/74 116/61 125/64  Pulse: 74 77 71 69  Resp: '16 20 19 17  '$ Temp: 98 F (36.7 C) 97.7 F (36.5 C) 98.1 F (36.7 C) 98.2 F (36.8 C)  TempSrc: Oral Oral Oral Oral  SpO2: 98% 96% 95% 94%  Weight:      Height:       Physical Exam Constitutional:      General: She is not in acute distress.    Appearance: She is obese.  HENT:     Head: Normocephalic and atraumatic.  Eyes:     Extraocular Movements: Extraocular movements intact.     Pupils: Pupils are equal, round, and reactive to light.  Cardiovascular:     Rate and Rhythm: Normal rate and regular rhythm.     Pulses: Normal pulses.     Heart sounds: Normal heart sounds.  Pulmonary:     Effort: Pulmonary effort is normal.     Breath sounds: Normal breath sounds.  Chest:     Chest wall: No tenderness.  Abdominal:     Palpations: Abdomen is soft.     Tenderness: There is no  abdominal tenderness.  Skin:    General: Skin is warm and dry.     Capillary Refill: Capillary refill takes less than 2 seconds.  Neurological:     General: No focal deficit present.  Psychiatric:        Mood and Affect: Mood normal.     Data Reviewed: CARDIAC CATHETERIZATION 1.  Unsuccessful pericardiocentesis and pericardial drain placement due to  very protuberant abdomen; echocardiography on site could not identify an  apical area for needle placement.  Recommendation: Consider pericardial  window.   DVT: Heparin Diet: Cardiac/Diabetic  Family Communication: Spoke with patient and family at bedside.  Disposition: Status is: Inpatient Remains inpatient appropriate because: management of CHF Planned Discharge Destination: Skilled nursing facility  Time spent: 60 minutes  Author: George Hugh, MD 12/31/2022 5:28 PM  For on call review [email or web address].

## 2023-01-01 DIAGNOSIS — I5043 Acute on chronic combined systolic (congestive) and diastolic (congestive) heart failure: Secondary | ICD-10-CM | POA: Diagnosis not present

## 2023-01-01 LAB — GLUCOSE, CAPILLARY
Glucose-Capillary: 97 mg/dL (ref 70–99)
Glucose-Capillary: 95 mg/dL (ref 70–99)
Glucose-Capillary: 151 mg/dL — ABNORMAL HIGH (ref 70–99)
Glucose-Capillary: 212 mg/dL — ABNORMAL HIGH (ref 70–99)
Glucose-Capillary: 113 mg/dL — ABNORMAL HIGH (ref 70–99)

## 2023-01-01 LAB — CBC
HCT: 31.1 % — ABNORMAL LOW (ref 36.0–46.0)
Hemoglobin: 9.8 g/dL — ABNORMAL LOW (ref 12.0–15.0)
MCH: 28.2 pg (ref 26.0–34.0)
MCHC: 31.5 g/dL (ref 30.0–36.0)
MCV: 89.6 fL (ref 80.0–100.0)
Platelets: 204 10*3/uL (ref 150–400)
RBC: 3.47 MIL/uL — ABNORMAL LOW (ref 3.87–5.11)
RDW: 14.8 % (ref 11.5–15.5)
WBC: 8.4 10*3/uL (ref 4.0–10.5)
nRBC: 0 % (ref 0.0–0.2)

## 2023-01-01 LAB — BASIC METABOLIC PANEL
Anion gap: 6 (ref 5–15)
BUN: 24 mg/dL — ABNORMAL HIGH (ref 8–23)
CO2: 28 mmol/L (ref 22–32)
Calcium: 7.8 mg/dL — ABNORMAL LOW (ref 8.9–10.3)
Chloride: 104 mmol/L (ref 98–111)
Creatinine, Ser: 1.56 mg/dL — ABNORMAL HIGH (ref 0.44–1.00)
GFR, Estimated: 37 mL/min — ABNORMAL LOW (ref >60)
Glucose, Bld: 93 mg/dL (ref 70–99)
Potassium: 4.3 mmol/L (ref 3.5–5.1)
Sodium: 138 mmol/L (ref 135–145)

## 2023-01-01 MED ORDER — FUROSEMIDE 40 MG PO TABS
40.0000 mg | ORAL_TABLET | Freq: Every day | ORAL | Status: DC
  Administered 2023-01-01 – 2023-01-02 (×2): 40 mg via ORAL
  Filled 2023-01-01 (×2): qty 1

## 2023-01-01 NOTE — Progress Notes (Signed)
Mobility Specialist Progress Note:   01/01/23 1030  Mobility  Activity Ambulated with assistance in room;Transferred from bed to chair  Level of Assistance Standby assist, set-up cues, supervision of patient - no hands on  Assistive Device Front wheel walker  Distance Ambulated (ft) 4 ft  Activity Response Tolerated well  $Mobility charge 1 Mobility   Pt in bed willing to participate in mobility. No complaints of pain. Left in chair with call bell in reach and all needs met.   [name] Mobility Specialist Please contact via [employer] or  Rehab Office at [phone]

## 2023-01-01 NOTE — Progress Notes (Signed)
   Heart Failure Stewardship Pharmacist Progress Note   PCP: Neale Burly, MD PCP-Cardiologist: Carlyle Dolly, MD    HPI:  67 yo F with PMH of CHF, CAD, HTN, glaucoma, hypothyroidism, OSA, T2DM, obesity, and arthritis.   Known ischemic cardiomyopathy since at least 2018. EF at that time was 30-35% and multivessel CAD on cath. Received 2 stents to circumflex. EF has remained in the 30-40% range since then.   She presented to the ED on 1/10 with shortness of breath and LE edema. Denies orthopnea. CXR with no acute cardiopulmonary disease and stable cardiomegaly. CTA showed large pericardial effusion and trace bilateral pleural fluid. ECHO 1/12 showed LVEF 30-35%, RV normal, findings suspicious for cardiac tamponade. Transferred to [facility] for repeat ECHO and possible pericardiocentesis. Attempt at pericardiocentesis unsuccessful. Pericardial window by CT surgery done 1/13.  Current HF Medications: Beta Blocker: metoprolol XL 25 mg daily ACE/ARB/ARNI: Entresto 24/26 mg BID MRA: spironolactone 12.5 mg daily SGLT2i: Jardiance 25 mg daily  Prior to admission HF Medications: Diuretic: furosemide 40 mg daily Beta blocker: carvedilol 12.5 mg BID ACE/ARB/ARNI: Entresto 97/103 mg BID *not taking Jardiance or spironolactone   Pertinent Lab Values: Serum creatinine 1.56, BUN 24, Potassium 4.3, Sodium 138, BNP 74, Magnesium 2.1, A1c 7.2   Vital Signs: Weight: 350 lbs (admission weight: 352 lbs) Blood pressure: 110/50s  Heart rate: 60s  I/O: not well documented yesterday; net -4.5L  Medication Assistance / Insurance Benefits Check: Does the patient have prescription insurance?  Yes Type of insurance plan: [facility] Medicare  Outpatient Pharmacy:  Prior to admission outpatient pharmacy: [facility] Is the patient willing to use [facility] at discharge? Yes Is the patient willing to transition their outpatient pharmacy to utilize a [facility] outpatient pharmacy?   Pending     Assessment: 1. Acute on chronic systolic CHF (LVEF 22-02%), due to ICM. NYHA class II symptoms. - Continue metoprolol XL 25 mg daily - Continue Entresto 24/26 mg BID - Continue spironolactone 12.5 mg daily  - Continue Jardiance 25 mg daily - pt complaining of dysuria. WBC stable and no fever. Monitor closely.    Plan: 1) Medication changes recommended at this time: - Continue current regimen  2) Patient assistance: - None pending  3)  Education  - Patient has been educated on current HF medications and potential additions to HF medication regimen - Patient verbalizes understanding that over the next few months, these medication doses may change and more medications may be added to optimize HF regimen - Patient has been educated on basic disease state pathophysiology and goals of therapy   Kerby Nora, PharmD, BCPS Heart Failure Stewardship Pharmacist Phone [phone]

## 2023-01-01 NOTE — Care Management Important Message (Signed)
Important Message  Patient Details  Name: Maureen Jackson MRN: [id number] Date of Birth: July 12, 1956   Medicare Important Message Given:  Yes     Shelda Altes 01/01/2023, 10:50 AM

## 2023-01-01 NOTE — Progress Notes (Signed)
[facility] PROGRESS NOTE  Maureen Jackson (DOB: [birth year]) ZOX:[id number] PCP: Neale Burly, MD  Brief Narrative: Maureen Jackson is a 67 y.o. female with a history of T2DM, HTN, PVD, CAD s/p DES 2018, chronic combined HFrEF, stage IIIa CKD, morbid obesity (BMI 56.6), and OSA who presented to the ED on 12/20/2022 with acute dyspnea found to have an enlarging pericardial effusion s/p 1/12 unsuccessful pericardiocentesis and 1/13 pericardial window with drain placement. On 1/15, drain was removed. PT/OT recommends SNF. Patient is medically stable for discharge and waiting placement.   Subjective: No new complaints. No chest pain or dyspnea. Had abdominal discomfort that resolved with large BM yesterday. Specifically denies any painful urination, burning, urinary frequency or urgency.  Objective: BP 111/70 (BP Location: Left Wrist)   Pulse 69   Temp 97.8 F (36.6 C) (Oral)   Resp 20   Ht 5' 5.98" (1.676 m)   Wt (!) 159 kg   SpO2 96%   BMI 56.60 kg/m   Gen: No distress, obese Pulm: Clear, distant, nonlabored  CV: RRR, no MRG, no pitting, though exam limited by habitus. GI: Soft, NT, ND, +BS  Neuro: Alert and oriented. No new focal deficits. Ext: Warm, no deformities Skin: No new rashes, lesions or ulcers on visualized skin   Assessment & Plan: Cardiac tamponade due to pericardial effusion: s/p pericardial window w/drain placement 1/13, removed 1/15. No malignant cells on pathology. Stable.     Chronic combined HFrEF, ICM: LVEF 30-35%.  - Continue GDMT with metoprolol (sub for coreg due to hypotension), entresto (dose lowered due to soft BPs earlier in hospitalization). Cotninue taking new spironolactone, SGLT2i   - Restart Lasix 40 mg daily in the morning if Creatinine is stable.   AKI on stage IIIa CKD:  - Creatinine increased slightly from 1.5 to 1.6 mg/dL.  Monitor BMPs.   Coronary Artery Disease s/p 2018 DES to LAD and RCA, HLD: No anginal complaints currently. -  Continue lifelong plavix and high-intensity statin (goal LGL < 70).   IDT2DM: Last HbA1c 7.3%.  - Continue tresiba, mealtime + SS novolog insulin - Restart ozempic at DC [employer] as above.    Post-thyroidectomy hypothyroidism: TSH is 130 consistent with inadequate dose/incomplete adherence to synthroid.  - Continue synthroid 23mg daily and recheck values to guide dosing changes.  - Follow up with endocrinology, Dr. NDorris Fetch   Dysuria: Denies having this symptom on 1/22. No leukocytosis or fever or bacteriuria. - Pith pyuria on UA 1/21, would initiate antimicrobial therapy and temporarily hold SGLT2i if dysuria recurs.   Constipation: Resolved abd discomfort with BM after miralax.    Morbid obesity: Body mass index is 56.6 kg/m.  - Continue ozempic    Physical deconditioning: - PT/OT recommends SNF on discharge, placement is pending bed availability.  RPatrecia Pour MD [facility] [email or web address] 01/01/2023, 1:53 PM

## 2023-01-01 NOTE — TOC Progression Note (Signed)
Transition of Care [facility]) - Progression Note    Patient Details  Name: Maureen Jackson MRN: [id number] Date of Birth: 1956-01-03  Transition of Care [facility]) CM/SW Contact  Vinie Sill, [place] Phone Number: 01/01/2023, 3:49 PM  Clinical Narrative:     Spoke with [facility]/Debbie- she states they should have bed available tomorrow- she will call CSW in the morning to confirm.   TOC will continue to follow  Thurmond Butts, MSW, LCSW Clinical Social Worker    Expected Discharge Plan: Skilled Nursing Facility Barriers to Discharge: Ship broker, Continued Medical Work up, SNF Pending bed offer  Expected Discharge Plan and Services In-house Referral: Clinical Social Work Discharge Planning Services: CM Consult Post Acute Care Choice: Durable Medical Equipment Living arrangements for the past 2 months: Apartment                                       Social Determinants of Health (SDOH) Interventions SDOH Screenings   Food Insecurity: No Food Insecurity (12/26/2022)  Housing: Low Risk  (12/22/2022)  Transportation Needs: No Transportation Needs (12/22/2022)  Utilities: Not At Risk (12/26/2022)  Alcohol Screen: Low Risk  (12/22/2022)  Depression (PHQ2-9): Low Risk  (01/01/2020)  Financial Resource Strain: Low Risk  (12/22/2022)  Tobacco Use: Medium Risk (12/25/2022)    Readmission Risk Interventions    12/21/2022    9:38 AM 05/19/2020    8:46 AM  Readmission Risk Prevention Plan  Transportation Screening Complete Complete  PCP or Specialist Appt within 3-5 Days  Not Complete  Home Care Screening Complete   Medication Review (RN CM) Complete   HRI or [place]  Complete  Social Work Consult for [place] Planning/Counseling  Complete  Palliative Care Screening  Not Applicable  Medication Review Press photographer)  Complete

## 2023-01-02 DIAGNOSIS — Z7401 Bed confinement status: Secondary | ICD-10-CM | POA: Diagnosis not present

## 2023-01-02 DIAGNOSIS — I5040 Unspecified combined systolic (congestive) and diastolic (congestive) heart failure: Secondary | ICD-10-CM | POA: Diagnosis not present

## 2023-01-02 DIAGNOSIS — K5900 Constipation, unspecified: Secondary | ICD-10-CM | POA: Diagnosis not present

## 2023-01-02 DIAGNOSIS — Z794 Long term (current) use of insulin: Secondary | ICD-10-CM | POA: Diagnosis not present

## 2023-01-02 DIAGNOSIS — I2510 Atherosclerotic heart disease of native coronary artery without angina pectoris: Secondary | ICD-10-CM | POA: Diagnosis not present

## 2023-01-02 DIAGNOSIS — Z955 Presence of coronary angioplasty implant and graft: Secondary | ICD-10-CM | POA: Diagnosis not present

## 2023-01-02 DIAGNOSIS — I5043 Acute on chronic combined systolic (congestive) and diastolic (congestive) heart failure: Secondary | ICD-10-CM | POA: Diagnosis not present

## 2023-01-02 DIAGNOSIS — I10 Essential (primary) hypertension: Secondary | ICD-10-CM | POA: Diagnosis not present

## 2023-01-02 DIAGNOSIS — Z7902 Long term (current) use of antithrombotics/antiplatelets: Secondary | ICD-10-CM | POA: Diagnosis not present

## 2023-01-02 DIAGNOSIS — I130 Hypertensive heart and chronic kidney disease with heart failure and stage 1 through stage 4 chronic kidney disease, or unspecified chronic kidney disease: Secondary | ICD-10-CM | POA: Diagnosis not present

## 2023-01-02 DIAGNOSIS — R293 Abnormal posture: Secondary | ICD-10-CM | POA: Diagnosis not present

## 2023-01-02 DIAGNOSIS — E1122 Type 2 diabetes mellitus with diabetic chronic kidney disease: Secondary | ICD-10-CM | POA: Diagnosis not present

## 2023-01-02 DIAGNOSIS — R531 Weakness: Secondary | ICD-10-CM | POA: Diagnosis not present

## 2023-01-02 DIAGNOSIS — I314 Cardiac tamponade: Secondary | ICD-10-CM | POA: Diagnosis not present

## 2023-01-02 DIAGNOSIS — Z741 Need for assistance with personal care: Secondary | ICD-10-CM | POA: Diagnosis not present

## 2023-01-02 DIAGNOSIS — R269 Unspecified abnormalities of gait and mobility: Secondary | ICD-10-CM | POA: Diagnosis not present

## 2023-01-02 DIAGNOSIS — H409 Unspecified glaucoma: Secondary | ICD-10-CM | POA: Diagnosis not present

## 2023-01-02 DIAGNOSIS — G459 Transient cerebral ischemic attack, unspecified: Secondary | ICD-10-CM | POA: Diagnosis not present

## 2023-01-02 DIAGNOSIS — I5020 Unspecified systolic (congestive) heart failure: Secondary | ICD-10-CM | POA: Diagnosis not present

## 2023-01-02 DIAGNOSIS — I517 Cardiomegaly: Secondary | ICD-10-CM | POA: Diagnosis not present

## 2023-01-02 DIAGNOSIS — Z9889 Other specified postprocedural states: Secondary | ICD-10-CM | POA: Diagnosis not present

## 2023-01-02 DIAGNOSIS — M6281 Muscle weakness (generalized): Secondary | ICD-10-CM | POA: Diagnosis not present

## 2023-01-02 DIAGNOSIS — Z7984 Long term (current) use of oral hypoglycemic drugs: Secondary | ICD-10-CM | POA: Diagnosis not present

## 2023-01-02 DIAGNOSIS — I3139 Other pericardial effusion (noninflammatory): Secondary | ICD-10-CM | POA: Diagnosis not present

## 2023-01-02 DIAGNOSIS — E039 Hypothyroidism, unspecified: Secondary | ICD-10-CM | POA: Diagnosis not present

## 2023-01-02 DIAGNOSIS — N1831 Chronic kidney disease, stage 3a: Secondary | ICD-10-CM | POA: Diagnosis not present

## 2023-01-02 DIAGNOSIS — E890 Postprocedural hypothyroidism: Secondary | ICD-10-CM | POA: Diagnosis not present

## 2023-01-02 DIAGNOSIS — E785 Hyperlipidemia, unspecified: Secondary | ICD-10-CM | POA: Diagnosis not present

## 2023-01-02 DIAGNOSIS — I255 Ischemic cardiomyopathy: Secondary | ICD-10-CM | POA: Diagnosis not present

## 2023-01-02 DIAGNOSIS — Z79899 Other long term (current) drug therapy: Secondary | ICD-10-CM | POA: Diagnosis not present

## 2023-01-02 DIAGNOSIS — R42 Dizziness and giddiness: Secondary | ICD-10-CM | POA: Diagnosis not present

## 2023-01-02 DIAGNOSIS — Z6841 Body Mass Index (BMI) 40.0 and over, adult: Secondary | ICD-10-CM | POA: Diagnosis not present

## 2023-01-02 DIAGNOSIS — I5042 Chronic combined systolic (congestive) and diastolic (congestive) heart failure: Secondary | ICD-10-CM | POA: Diagnosis not present

## 2023-01-02 DIAGNOSIS — Z79890 Hormone replacement therapy (postmenopausal): Secondary | ICD-10-CM | POA: Diagnosis not present

## 2023-01-02 DIAGNOSIS — E1159 Type 2 diabetes mellitus with other circulatory complications: Secondary | ICD-10-CM | POA: Diagnosis not present

## 2023-01-02 DIAGNOSIS — I5022 Chronic systolic (congestive) heart failure: Secondary | ICD-10-CM | POA: Diagnosis not present

## 2023-01-02 DIAGNOSIS — I110 Hypertensive heart disease with heart failure: Secondary | ICD-10-CM | POA: Diagnosis not present

## 2023-01-02 LAB — GLUCOSE, CAPILLARY
Glucose-Capillary: 89 mg/dL (ref 70–99)
Glucose-Capillary: 138 mg/dL — ABNORMAL HIGH (ref 70–99)
Glucose-Capillary: 152 mg/dL — ABNORMAL HIGH (ref 70–99)
Glucose-Capillary: 119 mg/dL — ABNORMAL HIGH (ref 70–99)

## 2023-01-02 MED ORDER — SACUBITRIL-VALSARTAN 24-26 MG PO TABS: 1.0000 | ORAL_TABLET | Freq: Two times a day (BID) | ORAL | 0 refills | Status: DC

## 2023-01-02 MED ORDER — INSULIN ASPART 100 UNIT/ML IJ SOLN: 0.0000 [IU] | Freq: Three times a day (TID) | INTRAMUSCULAR | Status: DC

## 2023-01-02 MED ORDER — METOPROLOL SUCCINATE ER 25 MG PO TB24: 25.0000 mg | ORAL_TABLET | Freq: Every day | ORAL | Status: DC

## 2023-01-02 MED ORDER — FUROSEMIDE 40 MG PO TABS: 40.0000 mg | ORAL_TABLET | Freq: Every day | ORAL | Status: DC

## 2023-01-02 MED ORDER — TRESIBA FLEXTOUCH 100 UNIT/ML ~~LOC~~ SOPN: 10.0000 [IU] | PEN_INJECTOR | Freq: Every day | SUBCUTANEOUS | Status: DC

## 2023-01-02 NOTE — Progress Notes (Signed)
Pts. R IJ CVC removed per MD order. Catheter intact upon removal, dressing placed, and  pressure held, site clean and dry. MD aware pt no longer has IV access .

## 2023-01-02 NOTE — Progress Notes (Signed)
Occupational Therapy Treatment Patient Details Name: Maureen Jackson MRN: [id number] DOB: May 21, 1956 Today's Date: 01/02/2023   History of present illness 67 y.o.female who was admitted on 1/10 with acute systolic CHF exacerbation with LVEF 30-35% and noted pericardial effusion that is concerning for possible tamponade.  On 1/12, cardiology attempted pericardiocentesis but unsuccessful.  CT surgery took patient on 1/13 for subxiphoid pericardial window with drainage of effusion as well as pericardial biopsy and placement of drain.  By 1/15, chest tube discontinued. PMH: morbid obesity,CAD,HTN,severe hypothyroidism,T2DM, CKD stage IIIa, chronic systolic and diastolic CHF with LVEF 81%, and chronic pericardial effusion   OT comments  Patient seated in recliner, declined mobility today but engaged in CHF education for managing symptoms.  Pt able to voice needing to exercise and not eating salt, but further engaged in education of daily weights, food options with low sodium/salt sources, and medication mgmt.  She appreciated education.  Completed grooming with setup.  Plans to dc to SNF, will follow acutely.    Recommendations for follow up therapy are one component of a multi-disciplinary discharge planning process, led by the attending physician.  Recommendations may be updated based on patient status, additional functional criteria and insurance authorization.    Follow Up Recommendations  Skilled nursing-short term rehab (<3 hours/day)     Assistance Recommended at Discharge Frequent or constant Supervision/Assistance  Patient can return home with the following  A little help with walking and/or transfers;A lot of help with bathing/dressing/bathroom;Assistance with cooking/housework;Assist for transportation;Help with stairs or ramp for entrance   Equipment Recommendations  BSC/3in1 (bariatric)    Recommendations for Other Services      Precautions / Restrictions Precautions Precautions:  Fall Restrictions Weight Bearing Restrictions: No       Mobility Bed Mobility               General bed mobility comments: OOB in recliner    Transfers                         Balance                                           ADL either performed or assessed with clinical judgement   ADL Overall ADL's : Needs assistance/impaired     Grooming: Set up;Sitting                                 General ADL Comments: declined mobility, in recliner upon entry    Extremity/Trunk Assessment              Vision       Perception     Praxis      Cognition Arousal/Alertness: Awake/alert Behavior During Therapy: Flat affect Overall Cognitive Status: Within Functional Limits for tasks assessed                                 General Comments: decreased health literacy, good recall of techniques to manage CHF after discussion        Exercises      Shoulder Instructions       General Comments Reviewed steps to assist with managing CHF at home including daily weights, low sodium intake, exercise, medications.  Pertinent Vitals/ Pain       Pain Assessment Pain Assessment: No/denies pain  Home Living                                          Prior Functioning/Environment              Frequency  Min 2X/week        Progress Toward Goals  OT Goals(current goals can now be found in the care plan section)  Progress towards OT goals: Progressing toward goals  Acute Rehab OT Goals Patient Stated Goal: none stated Time For Goal Achievement: 01/11/23 Potential to Achieve Goals: [place] Discharge plan remains appropriate;Frequency remains appropriate    Co-evaluation                 AM-PAC OT "6 Clicks" Daily Activity     Outcome Measure   Help from another person eating meals?: None Help from another person taking care of personal grooming?: A Little Help  from another person toileting, which includes using toliet, bedpan, or urinal?: A Lot Help from another person bathing (including washing, rinsing, drying)?: A Lot Help from another person to put on and taking off regular upper body clothing?: A Little Help from another person to put on and taking off regular lower body clothing?: A Lot 6 Click Score: 16    End of Session    OT Visit Diagnosis: Other abnormalities of gait and mobility (R26.89);Muscle weakness (generalized) (M62.81);Pain   Activity Tolerance Patient tolerated treatment well   Patient Left in chair;with call bell/phone within reach   Nurse Communication Mobility status        Time: 1113-1130 OT Time Calculation (min): 17 min  Charges: OT General Charges $OT Visit: 1 Visit OT Treatments $Self Care/Home Management : 8-22 mins  Jolaine Artist, OT [place] [phone]   Delight Stare 01/02/2023, 1:21 PM

## 2023-01-02 NOTE — TOC Transition Note (Signed)
Transition of Care [facility]) - CM/SW Discharge Note   Patient Details  Name: Maureen Jackson MRN: [id number] Date of Birth: [birth year]  Transition of Care [facility]) CM/SW Contact:  Vinie Sill, LCSW Phone Number: 01/02/2023, 1:54 PM   Clinical Narrative:     Patient will Discharge to: [facility] Discharge Date: 01/02/23 Family Notified: daughter Transport By: Corey Harold  Per MD patient is ready for discharge. RN, patient, and facility notified of discharge. Discharge Summary sent to facility. RN given number for [phone]. Ambulance transport requested for patient.   Clinical Social Worker signing off.  Thurmond Butts, MSW, LCSW Clinical Social Worker     Final next level of care: Skilled Nursing Facility Barriers to Discharge: Barriers Resolved   Patient Goals and CMS Choice CMS Medicare.gov Compare Post Acute Care list provided to:: Patient Choice offered to / list presented to : Patient  Discharge Placement                Patient chooses bed at:  ([facility]) Patient to be transferred to facility by: [place] Name of family member notified: daughter Patient and family notified of of transfer: 01/02/23  Discharge Plan and Services Additional resources added to the After Visit Summary for   In-house Referral: Clinical Social Work Discharge Planning Services: CM Consult Post Acute Care Choice: Durable Medical Equipment                               Social Determinants of Health (SDOH) Interventions SDOH Screenings   Food Insecurity: No Food Insecurity (12/26/2022)  Housing: Low Risk  (12/22/2022)  Transportation Needs: No Transportation Needs (12/22/2022)  Utilities: Not At Risk (12/26/2022)  Alcohol Screen: Low Risk  (12/22/2022)  Depression (PHQ2-9): Low Risk  (01/01/2020)  Financial Resource Strain: Low Risk  (12/22/2022)  Tobacco Use: Medium Risk (12/25/2022)     Readmission Risk Interventions    12/21/2022    9:38 AM 05/19/2020    8:46 AM   Readmission Risk Prevention Plan  Transportation Screening Complete Complete  PCP or Specialist Appt within 3-5 Days  Not Complete  Home Care Screening Complete   Medication Review (RN CM) Complete   HRI or Home Care Consult  Complete  Social Work Consult for [place] Planning/Counseling  Complete  Palliative Care Screening  Not Applicable  Medication Review Press photographer)  Complete

## 2023-01-02 NOTE — Plan of Care (Signed)
  Problem: Education: Goal: Knowledge of General Education information will improve Description: Including pain rating scale, medication(s)/side effects and non-pharmacologic comfort measures Outcome: Progressing   Problem: Health Behavior/Discharge Planning: Goal: Ability to manage health-related needs will improve Outcome: Progressing   Problem: Clinical Measurements: Goal: Will remain free from infection Outcome: Progressing Goal: Cardiovascular complication will be avoided Outcome: Progressing   Problem: Activity: Goal: Risk for activity intolerance will decrease Outcome: Progressing   Problem: Elimination: Goal: Will not experience complications related to bowel motility Outcome: Progressing

## 2023-01-02 NOTE — Discharge Summary (Signed)
Physician Discharge Summary   Patient: Maureen Jackson MRN: [id number] DOB: [birth year]  Admit date:     12/20/2022  Discharge date: 01/02/23  Discharge Physician: Patrecia Pour   PCP: Neale Burly, MD   Recommendations at discharge:  Follow up with PCP in 1-2 weeks post SNF discharge. Monitor TSH, free T4. Pt on synthroid 226mg.  Monitor BMP/CBC in the next week. Follow up with cardiology for CHF as scheduled. Note changes to GDMT due to hypotension including stopping norvasc, switching coreg to metoprolol succinate, decreasing entresto dose. Also decreased lasix dose to '40mg'$  daily at time of discharge due to AKI. Continuing spironolactone.  Discharge Diagnoses: Principal Problem:   Acute on chronic combined systolic and diastolic CHF (congestive heart failure) (HCC) Active Problems:   Hypothyroidism   Essential hypertension, benign   Morbid obesity/BMI > 55   DM type 2 causing vascular disease (HCC)   Pericardial effusion   Coronary artery disease involving native coronary artery of native heart without angina pectoris   Ischemic cardiomyopathy   Cardiac tamponade  Hospital Course: Maureen AZBILLis a 67y.o. female with a history of T2DM, HTN, PVD, CAD s/p DES 2018, chronic combined HFrEF, stage IIIa CKD, morbid obesity (BMI 56.6), and OSA who presented to the ED on 12/20/2022 with acute dyspnea found to have an enlarging pericardial effusion s/p 1/12 unsuccessful pericardiocentesis and 1/13 pericardial window with drain placement. On 1/15, drain was removed. PT/OT recommends SNF. Patient is medically stable for discharge and waiting placement.    Assessment and Plan: Cardiac tamponade due to pericardial effusion: s/p pericardial window w/drain placement 1/13, removed 1/15. No malignant cells on pathology. Stable.     Chronic combined HFrEF, ICM: LVEF 30-35%.  - Continue GDMT with metoprolol (sub for coreg due to hypotension), entresto (dose lowered due to soft BPs earlier in  hospitalization). Continue spironolactone, SGLT2i   - Restarted Lasix 40 mg daily in the morning with stable Cr.   AKI on stage IIIa CKD:  - Creatinine increased slightly from 1.5 to 1.6 mg/dL.  Monitor BMPs.   Coronary Artery Disease s/p 2018 DES to LAD and RCA, HLD: No anginal complaints currently. - Continue lifelong plavix and high-intensity statin (goal LGL < 70).   IDT2DM: Last HbA1c 7.3%.  - Continue tresiba, dose decreased to 10u, continue sliding scale novolog. - Restart ozempic at DC - Continue jardiance as above.    Post-thyroidectomy hypothyroidism: TSH is 130 consistent with inadequate dose/incomplete adherence to synthroid.  - Continue synthroid 2560m daily and recheck values to guide dosing changes.  - Follow up with endocrinology, Dr. NiDorris Fetch   Dysuria: Denies having this symptom on 1/22. No leukocytosis or fever or bacteriuria. - Pith pyuria on UA 1/21, would initiate antimicrobial therapy and temporarily hold SGLT2i if dysuria recurs.   Glaucoma: continue gtt's   Constipation: Resolved abd discomfort with BM after miralax.    Morbid obesity: Body mass index is 56.6 kg/m.  - Continue ozempic    Physical deconditioning: - PT/OT recommends SNF on discharge   Consultants: Cardiology, cardiothoracic surgery Procedures performed:  12/22/22 PERICARDIOCENTESIS ThEarly OsmondMD  12/23/22 SUBXYPHOID PERICARDIAL WINDOW WeCoralie CommonMD  Disposition: Skilled nursing facility Diet recommendation:  Cardiac and Carb modified diet DISCHARGE MEDICATION: Allergies as of 01/02/2023       Reactions   Ampicillin Other (See Comments)   [facility] Aspirin Nausea Only   Hydrocodone Other (See Comments)   "[facility]  Unasyn [ampicillin-sulbactam Sodium] Rash, Other (See Comments)   "Allergic," per [facility]        Medication List     STOP taking these medications    amLODipine 5 MG tablet Commonly known as: NORVASC   carvedilol 12.5 MG  tablet Commonly known as: COREG   Entresto 97-103 MG Generic drug: sacubitril-valsartan Replaced by: sacubitril-valsartan 24-26 MG   HumaLOG KwikPen 100 UNIT/ML KwikPen Generic drug: insulin lispro   NovoLOG FlexPen 100 UNIT/ML FlexPen Generic drug: insulin aspart Replaced by: insulin aspart 100 UNIT/ML injection       TAKE these medications    acetaminophen 325 MG tablet Commonly known as: TYLENOL Take 2 tablets (650 mg total) by mouth every 4 (four) hours as needed for mild pain, fever or headache.   atorvastatin 80 MG tablet Commonly known as: LIPITOR TAKE 1 TABLET(80 MG) BY MOUTH DAILY What changed: See the new instructions.   calcium carbonate 500 MG chewable tablet Commonly known as: TUMS - dosed in mg elemental calcium Chew 4 tablets by mouth every evening.   cetirizine 10 MG tablet Commonly known as: ZYRTEC Take 10 mg by mouth daily as needed for allergies.   clopidogrel 75 MG tablet Commonly known as: PLAVIX TAKE 1 TABLET(75 MG) BY MOUTH DAILY What changed: See the new instructions.   Combigan 0.2-0.5 % ophthalmic solution Generic drug: brimonidine-timolol Place 1 drop into both eyes 2 (two) times daily.   diclofenac Sodium 1 % Gel Commonly known as: VOLTAREN Apply 2 g topically daily as needed (arthritis).   furosemide 40 MG tablet Commonly known as: LASIX Take 1 tablet (40 mg total) by mouth daily.   insulin aspart 100 UNIT/ML injection Commonly known as: novoLOG Inject 0-15 Units into the skin 3 (three) times daily with meals. Replaces: NovoLOG FlexPen 100 UNIT/ML FlexPen   Jardiance 25 MG Tabs tablet Generic drug: empagliflozin Take 25 mg by mouth daily.   levothyroxine 50 MCG tablet Commonly known as: SYNTHROID Take 50 mcg by mouth daily before breakfast. Take 1 tablet with 218mg for a total dose of 2574m daily   levothyroxine 200 MCG tablet Commonly known as: SYNTHROID Take 200 mcg by mouth daily before breakfast. Take 1 tablet with  5045mfor a total dose of 250m76maily   metoprolol succinate 25 MG 24 hr tablet Commonly known as: TOPROL-XL Take 1 tablet (25 mg total) by mouth daily. Start taking on: January 03, 2023   OneTouch Verio test strip Generic drug: glucose blood Test glucose 4 times day.   Ozempic (0.25 or 0.5 MG/DOSE) 2 MG/1.5ML Sopn Generic drug: Semaglutide(0.25 or 0.'5MG'$ /DOS) Inject 0.25 mg into the skin once a week. Thursday   sacubitril-valsartan 24-26 MG Commonly known as: ENTRESTO Take 1 tablet by mouth 2 (two) times daily. Replaces: Entresto 97-103 MG   senna-docusate 8.6-50 MG tablet Commonly known as: Senokot-S Take 2 tablets by mouth 2 (two) times daily. What changed:  when to take this reasons to take this   spironolactone 25 MG tablet Commonly known as: ALDACTONE Take 0.5 tablets (12.5 mg total) by mouth daily.   TresTyler AasxTouch 100 UNIT/ML FlexTouch Pen Generic drug: insulin degludec Inject 10 Units into the skin at bedtime. What changed:  how much to take when to take this reasons to take this   Ventolin HFA 108 (90 Base) MCG/ACT inhaler Generic drug: albuterol Inhale 1-2 puffs into the lungs every 6 (six) hours as needed for wheezing or shortness of breath.        Follow-up  Information     [facility] [place]. Go in 8 day(s).   Specialty: Cardiology Why: Hospital follow up 01/10/23 @ 11 am PLEASE bring aa current medication list to appointment FREE valet parking, Entrance C, off [employer] information: [address] [id number] [place] [address] Conway, Xaje A, MD Follow up.   Specialty: Internal Medicine Contact information: [place] [address] [phone]                Discharge Exam: Filed Weights   12/28/22 0500 12/29/22 0239 01/02/23 0446  Weight: (!) 158.7 kg (!) 159 kg (!) 152 kg  BP 101/62   Pulse 66   Temp 97.6 F  (36.4 C) (Oral)   Resp 18   Ht 5' 5.98" (1.676 m)   Wt (!) 152 kg   SpO2 100%   BMI 54.11 kg/m   Obese, pleasant female in no distress Clear, nonlabored RRR, no pitting edema Hirsutism noted  Condition at discharge: stable  The results of significant diagnostics from this hospitalization (including imaging, microbiology, ancillary and laboratory) are listed below for reference.   Imaging Studies: DG Chest 1 View  Result Date: 12/24/2022 CLINICAL DATA:  Pericardial effusion. EXAM: CHEST  1 VIEW COMPARISON:  12/20/2022 FINDINGS: Decreased heart size seen since previous study. Right jugular central venous catheter seen with tip overlying the distal SVC. Evidence of pneumothorax. Other catheter overlying the heart of unknown type but may represent a pericardial drain. Diffuse interstitial infiltrates are suspicious for pulmonary edema. Asymmetric atelectasis or consolidation is seen in the left lower lobe. IMPRESSION: Decreased heart size since prior study. Diffuse interstitial infiltrates, suspicious for pulmonary edema. Asymmetric atelectasis or consolidation in left lower lobe. Electronically Signed   By: Marlaine Hind M.D.   On: 12/24/2022 09:17   CARDIAC CATHETERIZATION  Result Date: 12/22/2022 1.  Unsuccessful pericardiocentesis and pericardial drain placement due to very protuberant abdomen; echocardiography on site could not identify an apical area for needle placement. Recommendation: Consider pericardial window.   ECHOCARDIOGRAM LIMITED  Result Date: 12/22/2022    ECHOCARDIOGRAM LIMITED REPORT   Patient Name:   Maureen Jackson Date of Exam: 12/22/2022 Medical Rec #:  [id number]       Height:       66.0 in Accession #:    [id number]      Weight:       351.9 lb Date of Birth:  10-20-1956        BSA:          2.542 m Patient Age:    4 years        BP:           128/79 mmHg Patient Gender: F               HR:           66 bpm. Exam Location:  Inpatient Procedure: Limited Echo Indications:     Pericardial effusion I31.3  History:        Patient has prior history of Echocardiogram examinations, most                 recent 12/20/2022. CHF, CAD, Arrythmias:LBBB; Risk                 Factors:Hypertension and Sleep Apnea.  Sonographer:    Darlina Sicilian RDCS Referring Phys: [id number] [place]  1.  Large pericardial effusion up to 4.8 cm anterior to RV (subcostal) and 2.8 cm posterior to LV. The RV appears small and underfilled. There is systolic collapse of the RA. The IVC is dilated. Overall, findings are suspicious for cardiac tamponade, but  clinical correlation is recommended. Large pericardial effusion. The pericardial effusion is posterior and lateral to the left ventricle and anterior to the right ventricle.  2. Left ventricular ejection fraction, by estimation, is 30 to 35%. The left ventricle has moderately decreased function. Left ventricular endocardial border not optimally defined to evaluate regional wall motion.  3. Right ventricular systolic function is normal. The right ventricular size is normal.  4. The inferior vena cava is dilated in size with >50% respiratory variability, suggesting right atrial pressure of 8 mmHg. FINDINGS  Left Ventricle: Left ventricular ejection fraction, by estimation, is 30 to 35%. The left ventricle has moderately decreased function. Left ventricular endocardial border not optimally defined to evaluate regional wall motion. Right Ventricle: The right ventricular size is normal. Right ventricular systolic function is normal. Pericardium: Large pericardial effusion up to 4.8 cm anterior to RV (subcostal) and 2.8 cm posterior to LV. The RV appears small and underfilled. There is systolic collapse of the RA. The IVC is dilated. Overall, findings are suspicious for cardiac tamponade, but clinical correlation is recommended. A large pericardial effusion is present. The pericardial effusion is posterior and lateral to the left ventricle and anterior to the  right ventricle. Venous: The inferior vena cava is dilated in size with greater than 50% respiratory variability, suggesting right atrial pressure of 8 mmHg. Eleonore Chiquito MD Electronically signed by Eleonore Chiquito MD Signature Date/Time: 12/22/2022/11:07:58 AM    Final    ECHOCARDIOGRAM COMPLETE  Result Date: 12/20/2022    ECHOCARDIOGRAM REPORT   Patient Name:   Maureen Jackson Date of Exam: 12/20/2022 Medical Rec #:  [id number]       Height:       66.0 in Accession #:    [id number]      Weight:       335.0 lb Date of Birth:  02/06/1956        BSA:          2.490 m Patient Age:    21 years        BP:           126/74 mmHg Patient Gender: F               HR:           72 bpm. Exam Location:  [facility] Procedure: 2D Echo, Cardiac Doppler, Color Doppler and Intracardiac            Opacification Agent Indications:    Dyspnea  History:        Patient has prior history of Echocardiogram examinations, most                 recent 03/21/2022. CHF, CAD, TIA, Signs/Symptoms:Chest Pain and                 Edema; Risk Factors:Diabetes, Hypertension, Dyslipidemia and                 Former Smoker.  Sonographer:    Wenda Low Referring Phys: [id number] JULIE HAVILAND  Sonographer Comments: No subcostal window, suboptimal apical window, suboptimal parasternal window, Technically difficult study due to poor echo windows and patient is obese. IMPRESSIONS  1. Poor acoustic windows for evaluation of pericardial effusion. There is  a large pericardial effusion lateral to the left ventricle and moderate pericardial effusion anterior to the right ventricle although large effusion cannot be excluded due to poor  windows. No subcostal window. Suboptimal apical window with possible right atrial diastolic collapse but right ventricle is not well visualized.  2. Left ventricular ejection fraction, by estimation, is 30 to 35%. The left ventricle has moderately decreased function. Assessment of regional wall motion is suboptimal despite use of  definity. There is akinesis of anteroseptal wall and apex. There is  severe left ventricular hypertrophy. Left ventricular diastolic parameters are indeterminate.  3. Right ventricular systolic function was not well visualized. The right ventricular size is not well visualized.  4. The mitral valve was not well visualized. No evidence of mitral valve regurgitation. No evidence of mitral stenosis.  5. The aortic valve is tricuspid. Aortic valve regurgitation is not visualized. No aortic stenosis is present. Comparison(s): No significant change from prior study. FINDINGS  Left Ventricle: Left ventricular ejection fraction, by estimation, is 30 to 35%. The left ventricle has moderately decreased function. The left ventricle demonstrates regional wall motion abnormalities. Definity contrast agent was given IV to delineate the left ventricular endocardial borders. The left ventricular internal cavity size was normal in size. There is severe left ventricular hypertrophy. Left ventricular diastolic parameters are indeterminate.  LV Wall Scoring: The anterior septum and apex are akinetic. Right Ventricle: The right ventricular size is not well visualized. Right vetricular wall thickness was not well visualized. Right ventricular systolic function was not well visualized. Left Atrium: Left atrial size was normal in size. Right Atrium: Right atrial size was normal in size. Pericardium: Poor acoustic windows for evaluation of pericardial effusion. There is a large pericardial effusion lateral to the left ventricle and moderate pericardial effusion anterior to the right ventricle although large effusion cannot be excluded due to poor windows. No subcostal window. Suboptimal apical window with possible right atrial diastolic collapse but right ventricle is not well visualized. Mitral Valve: The mitral valve was not well visualized. No evidence of mitral valve regurgitation. No evidence of mitral valve stenosis. MV peak gradient,  2.4 mmHg. The mean mitral valve gradient is 1.0 mmHg. Tricuspid Valve: The tricuspid valve is not well visualized. Tricuspid valve regurgitation is not demonstrated. No evidence of tricuspid stenosis. Aortic Valve: The aortic valve is tricuspid. Aortic valve regurgitation is not visualized. No aortic stenosis is present. Aortic valve mean gradient measures 2.0 mmHg. Aortic valve peak gradient measures 4.0 mmHg. Aortic valve area, by VTI measures 2.30 cm. Pulmonic Valve: The pulmonic valve was not well visualized. Pulmonic valve regurgitation is not visualized. No evidence of pulmonic stenosis. Aorta: The aortic root is normal in size and structure. Venous: The inferior vena cava was not well visualized. IAS/Shunts: The interatrial septum was not well visualized.  LEFT VENTRICLE PLAX 2D LVIDd:         5.50 cm LVIDs:         4.20 cm LV PW:         1.50 cm LV IVS:        1.60 cm LVOT diam:     2.00 cm LV SV:         45 LV SV Index:   18 LVOT Area:     3.14 cm  LEFT ATRIUM         Index LA diam:    4.00 cm 1.61 cm/m  AORTIC VALVE AV Area (Vmax):    2.43 cm AV Area (Vmean):  2.03 cm AV Area (VTI):     2.30 cm AV Vmax:           99.60 cm/s AV Vmean:          68.500 cm/s AV VTI:            0.197 m AV Peak Grad:      4.0 mmHg AV Mean Grad:      2.0 mmHg LVOT Vmax:         77.00 cm/s LVOT Vmean:        44.300 cm/s LVOT VTI:          0.144 m LVOT/AV VTI ratio: 0.73  AORTA Ao Root diam: 3.30 cm MITRAL VALVE MV Area (PHT): 4.15 cm    SHUNTS MV Area VTI:   2.34 cm    Systemic VTI:  0.14 m MV Peak grad:  2.4 mmHg    Systemic Diam: 2.00 cm MV Mean grad:  1.0 mmHg MV Vmax:       0.78 m/s MV Vmean:      49.2 cm/s MV Decel Time: 183 msec MV E velocity: 79.30 cm/s MV A velocity: 66.80 cm/s MV E/A ratio:  1.19 Vishnu Priya Mallipeddi Electronically signed by Lorelee Cover Mallipeddi Signature Date/Time: 12/20/2022/4:40:34 PM    Final    CT Angio Chest PE W and/or Wo Contrast  Result Date: 12/20/2022 CLINICAL DATA:  Worsening  shortness of breath for 2 weeks. EXAM: CT ANGIOGRAPHY CHEST WITH CONTRAST TECHNIQUE: Multidetector CT imaging of the chest was performed using the standard protocol during bolus administration of intravenous contrast. Multiplanar CT image reconstructions and MIPs were obtained to evaluate the vascular anatomy. RADIATION DOSE REDUCTION: This exam was performed according to the departmental dose-optimization program which includes automated exposure control, adjustment of the mA and/or kV according to patient size and/or use of iterative reconstruction technique. CONTRAST:  47m OMNIPAQUE IOHEXOL 350 MG/ML SOLN COMPARISON:  Chest x-ray 12/20/2022 and older FINDINGS: Cardiovascular: There is a large pericardial effusion. Coronary artery calcifications are seen. The heart itself is nonenlarged. The thoracic aorta is of normal course and caliber with some mild atherosclerotic changes. There is some reflux of contrast into the intrahepatic IVC. With significant breathing motion evaluation of small and peripheral emboli are limited. There is some enlargement of some pulmonary arterial branches. Please correlate for pulmonary arterial hypertension. No large or central embolus. Mediastinum/Nodes: Surgical changes in the thyroid bed. No specific abnormal lymph node enlargement seen in the axillary region, hilum or mediastinum. Small hiatal hernia. Otherwise normal caliber thoracic esophagus which is slightly patulous. Lungs/Pleura: Breathing motion seen throughout the examination. There is some mild scattered areas of ground-glass in both lungs. There is some enlargement of the pulmonary arteries. No pneumothorax. No consolidation. Trace bilateral pleural fluid. Upper Abdomen: Fatty liver infiltration seen in the upper abdomen. Adrenal glands are preserved. There is a replaced left hepatic artery from the left gastric. Congenital variant. Musculoskeletal: Scattered degenerative changes along the spine with bridging  osteophytes and syndesmophytes. Review of the MIP images confirms the above findings. IMPRESSION: Significant breathing motion limits evaluation of the pulmonary arterial tree. No large or central embolus. Large pericardial effusion. There is some reflux of contrast into the intrahepatic IVC. Please correlate for normal cardiac motion. Coronary artery calcifications. Please correlate for other coronary risk factors. Trace bilateral pleural fluid. Aortic Atherosclerosis (ICD10-I70.0). Electronically Signed   By: AJill SideM.D.   On: 12/20/2022 12:35   DG Chest Port 1 View  Result Date:  12/20/2022 CLINICAL DATA:  Short of breath. EXAM: PORTABLE CHEST 1 VIEW COMPARISON:  12/08/2022 and older exams. FINDINGS: Marked enlargement of the cardiopericardial silhouette, unchanged. No mediastinal or hilar masses. Clear lungs.  No convincing pleural effusion or pneumothorax. Skeletal structures are grossly intact. Stable changes from prior thyroid surgery. IMPRESSION: 1. No acute cardiopulmonary disease. 2. Marked cardiomegaly, stable from the prior study. Electronically Signed   By: Lajean Manes M.D.   On: 12/20/2022 10:50   DG Chest Port 1 View  Result Date: 12/08/2022 CLINICAL DATA:  shob EXAM: PORTABLE CHEST 1 VIEW COMPARISON:  Chest x-ray 08/26/2022. FINDINGS: Marked cardiomegaly. Pulmonary vascular congestion. No definite consolidation; however, technique and body habitus at limits assessment. No visible pneumothorax. No acute osseous abnormality. Polyarticular degenerative change. IMPRESSION: Marked cardiomegaly. Pulmonary vascular congestion. No definite consolidation; however, technique and body habitus at limits assessment. Electronically Signed   By: Margaretha Sheffield M.D.   On: 12/08/2022 11:12    Microbiology: Results for orders placed or performed during the hospital encounter of 12/20/22  Resp panel by RT-PCR (RSV, Flu A&B, Covid) Anterior Nasal Swab     Status: None   Collection Time: 12/20/22  10:11 AM   Specimen: Anterior Nasal Swab  Result Value Ref Range Status   SARS Coronavirus 2 by RT PCR NEGATIVE NEGATIVE Final    Comment: (NOTE) SARS-CoV-2 target nucleic acids are NOT DETECTED.  The SARS-CoV-2 RNA is generally detectable in upper respiratory specimens during the acute phase of infection. The lowest concentration of SARS-CoV-2 viral copies this assay can detect is 138 copies/mL. A negative result does not preclude SARS-Cov-2 infection and should not be used as the sole basis for treatment or other patient management decisions. A negative result may occur with  improper specimen collection/handling, submission of specimen other than nasopharyngeal swab, presence of viral mutation(s) within the areas targeted by this assay, and inadequate number of viral copies(<138 copies/mL). A negative result must be combined with clinical observations, patient history, and epidemiological information. The expected result is Negative.  Fact Sheet for Patients:  EntrepreneurPulse.com.au  Fact Sheet for Healthcare Providers:  IncredibleEmployment.be  This test is no t yet approved or cleared by the [place] FDA and  has been authorized for detection and/or diagnosis of SARS-CoV-2 by FDA under an Emergency Use Authorization (EUA). This EUA will remain  in effect (meaning this test can be used) for the duration of the COVID-19 declaration under Section 564(b)(1) of the Act, 21 U.S.C.section 360bbb-3(b)(1), unless the authorization is terminated  or revoked sooner.       Influenza A by PCR NEGATIVE NEGATIVE Final   Influenza B by PCR NEGATIVE NEGATIVE Final    Comment: (NOTE) The Xpert Xpress SARS-CoV-2/FLU/RSV plus assay is intended as an aid in the diagnosis of influenza from Nasopharyngeal swab specimens and should not be used as a sole basis for treatment. Nasal washings and aspirates are unacceptable for Xpert Xpress  SARS-CoV-2/FLU/RSV testing.  Fact Sheet for Patients: EntrepreneurPulse.com.au  Fact Sheet for Healthcare Providers: IncredibleEmployment.be  This test is not yet approved or cleared by the [place] FDA and has been authorized for detection and/or diagnosis of SARS-CoV-2 by FDA under an Emergency Use Authorization (EUA). This EUA will remain in effect (meaning this test can be used) for the duration of the COVID-19 declaration under Section 564(b)(1) of the Act, 21 U.S.C. section 360bbb-3(b)(1), unless the authorization is terminated or revoked.     Resp Syncytial Virus by PCR NEGATIVE NEGATIVE Final  Comment: (NOTE) Fact Sheet for Patients: EntrepreneurPulse.com.au  Fact Sheet for Healthcare Providers: IncredibleEmployment.be  This test is not yet approved or cleared by the [place] FDA and has been authorized for detection and/or diagnosis of SARS-CoV-2 by FDA under an Emergency Use Authorization (EUA). This EUA will remain in effect (meaning this test can be used) for the duration of the COVID-19 declaration under Section 564(b)(1) of the Act, 21 U.S.C. section 360bbb-3(b)(1), unless the authorization is terminated or revoked.  Performed at [facility], [address] [place], [place] [address]   Surgical pcr screen     Status: None   Collection Time: 12/23/22  7:12 AM   Specimen: Nasal Mucosa; Nasal Swab  Result Value Ref Range Status   MRSA, PCR NEGATIVE NEGATIVE Final   Staphylococcus aureus NEGATIVE NEGATIVE Final    Comment: (NOTE) The Xpert SA Assay (FDA approved for NASAL specimens in patients 23 years of age and older), is one component of a comprehensive surveillance program. It is not intended to diagnose infection nor to guide or monitor treatment. Performed at [facility], [place] [address] [place], [place] [address]   Aerobic/Anaerobic Culture w Gram Stain  (surgical/deep wound)     Status: None   Collection Time: 12/23/22  8:52 AM   Specimen: PATH Cytology Misc. fluid; Body Fluid  Result Value Ref Range Status   Specimen Description PERITONEAL  Final   Special Requests NONE  Final   Gram Stain NO ORGANISMS SEEN NO WBC SEEN   Final   Culture   Final    No growth aerobically or anaerobically. Performed at [facility], [place] [address], [place], [place] [address]    Report Status 12/28/2022 FINAL  Final  Fungus Stain     Status: None   Collection Time: 12/23/22  8:52 AM   Specimen: PATH Cytology Misc. fluid; Body Fluid  Result Value Ref Range Status   FUNGUS STAIN Final report  Final    Comment: (NOTE) Performed At: [facility] [address] [place], [place] [id number] Rush Farmer MD [id number]    Fungal Source PERITONEAL  Final    Comment: Performed at [facility], [place] [address], [place], [place] [address]  Acid Fast Smear (AFB)     Status: None   Collection Time: 12/23/22  8:52 AM   Specimen: PATH Cytology Misc. fluid; Body Fluid  Result Value Ref Range Status   AFB Specimen Processing Concentration  Final   Acid Fast Smear Negative  Final    Comment: (NOTE) Performed At: [facility] [place], [place] [id number] Rush Farmer MD [id number]    Source (AFB) PERITONEAL  Final    Comment: Performed at [facility], [place] [address] [place],  [address]  Fungal Stain reflex     Status: None   Collection Time: 12/23/22  8:52 AM  Result Value Ref Range Status   Fungal stain result 1 Comment  Final    Comment: (NOTE) KOH/Calcofluor preparation:  no fungus observed. A duplicate report has been generated due to demographic updates. Performed At: [facility] [place], [place] [id number] Rush Farmer MD [id number]     Labs: CBC: Recent Labs  Lab 12/27/22 0553 12/28/22 0505 12/31/22 0130 01/01/23 0051  WBC 7.8 8.0 7.5 8.4  HGB  11.4* 10.4* 10.0* 9.8*  HCT 35.4* 33.6* 30.2* 31.1*  MCV 87.6 91.1 88.6 89.6  PLT 177 175 216 017   Basic Metabolic  Panel: Recent Labs  Lab 12/28/22 0505 12/29/22 0310 12/30/22 0742 12/31/22 0130 01/01/23 0051  NA 140 140 138 139 138  K 4.4 4.5 4.3 5.1 4.3  CL 104 104 103 104 104  CO2 '30 29 26 29 28  '$ GLUCOSE 123* 126* 97 117* 93  BUN 33* 32* 30* 29* 24*  CREATININE 1.67* 1.68* 1.51* 1.62* 1.56*  CALCIUM 8.2* 8.2* 8.0* 8.0* 7.8*   Liver Function Tests: No results for input(s): "AST", "ALT", "ALKPHOS", "BILITOT", "PROT", "ALBUMIN" in the last 168 hours. CBG: Recent Labs  Lab 01/01/23 1641 01/01/23 1725 01/01/23 2120 01/02/23 0624 01/02/23 1149  GLUCAP 212* 151* 113* 89 138*    Discharge time spent: greater than 30 minutes.  Signed: Patrecia Pour, MD [facility] 01/02/2023

## 2023-01-02 NOTE — Progress Notes (Signed)
Physical Therapy Treatment Patient Details Name: Maureen Jackson MRN: [id number] DOB: [birth year] Today's Date: 01/02/2023   History of Present Illness 67 y.o.female who was admitted on 1/10 with acute systolic CHF exacerbation with LVEF 30-35% and noted pericardial effusion that is concerning for possible tamponade.  On 1/12, cardiology attempted pericardiocentesis but unsuccessful.  CT surgery took patient on 1/13 for subxiphoid pericardial window with drainage of effusion as well as pericardial biopsy and placement of drain.  By 1/15, chest tube discontinued. 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 Pt performed bed mobility with supervision with heavy reliance on bed features (HOB elevated and bedrails). Pt requested to transfer without AD and stood from EOB without AD and mod A and transferred into recliner via stand<>pivot without AD and min A - noted poor eccentric control upon sitting. Breakfast tray arrived and pt declined ambulation. Left in recliner with all needs met eating breakfast. Acute PT to cont to follow.     Recommendations for follow up therapy are one component of a multi-disciplinary discharge planning process, led by the attending physician.  Recommendations may be updated based on patient status, additional functional criteria and insurance authorization.  Follow Up Recommendations  Skilled nursing-short term rehab (<3 hours/day) Can patient physically be transported by private vehicle: Yes   Assistance Recommended at Discharge Frequent or constant Supervision/Assistance  Patient can return home with the following A lot of help with bathing/dressing/bathroom;Assist for transportation;Assistance with cooking/housework;A lot of help with walking and/or transfers    Equipment Recommendations  Rolling walker (2 wheels);BSC/3in1;Hospital bed (bariatric)    Recommendations for Other Services OT consult     Precautions / Restrictions Precautions Precautions: Fall Restrictions Weight Bearing Restrictions: No     Mobility  Bed Mobility Overal bed mobility: Needs Assistance Bed Mobility: Supine to Sit Rolling: Supervision   Supine to sit: Supervision, HOB elevated     General bed mobility comments: required increased time and heavy reliance on bedrails to come to EOB Patient Response: Flat affect  Transfers Overall transfer level: Needs assistance Equipment used: None Transfers: Sit to/from Stand, Bed to chair/wheelchair/BSC Sit to Stand: Mod assist Stand pivot transfers: Min assist         General transfer comment: Pt requested to transfer to recliner without AD - required mod A to stand and min A (HHA) to pivot to recliner. Noted poor eccentric contol upon sitting down.    Ambulation/Gait               General Gait Details: pt declined and breakfast tray arrived   Stairs             Wheelchair Mobility    Modified Rankin (Stroke Patients Only)       Balance Overall balance assessment: Needs assistance Sitting-balance support: No upper extremity supported, Feet supported Sitting balance-Leahy Scale: Fair     Standing balance support: Single extremity supported Standing balance-Leahy Scale: Poor Standing balance comment: required heavy min  HHA for static standing balance                            Cognition Arousal/Alertness: Awake/alert Behavior During Therapy: Flat affect Overall Cognitive Status: Within Functional Limits for tasks assessed  Exercises      General Comments General comments (skin integrity, edema, etc.): Upon reaching recliner, pt declined any ambulation requesting to eat breakfast. HR increased to 78bpm after  transfer but quickly decreased to 70bpm with seated rest.      Pertinent Vitals/Pain Pain Assessment Pain Assessment: No/denies pain    Home Living                          Prior Function            PT Goals (current goals can now be found in the care plan section) Acute Rehab PT Goals Patient Stated Goal: did not state PT Goal Formulation: With patient Time For Goal Achievement: 01/09/23 Potential to Achieve Goals: Fair    Frequency    Min 2X/week      PT Plan Current plan remains appropriate    Co-evaluation              AM-PAC PT "6 Clicks" Mobility   Outcome Measure  Help needed turning from your back to your side while in a flat bed without using bedrails?: A Little Help needed moving from lying on your back to sitting on the side of a flat bed without using bedrails?: A Little Help needed moving to and from a bed to a chair (including a wheelchair)?: A Lot Help needed standing up from a chair using your arms (e.g., wheelchair or bedside chair)?: A Lot Help needed to walk in hospital room?: A Lot Help needed climbing 3-5 steps with a railing? : Total 6 Click Score: 13    End of Session   Activity Tolerance: Patient limited by fatigue;Other (comment) (self-limiting) Patient left: in chair;with call bell/phone within reach;with chair alarm set Nurse Communication: Mobility status;Other (comment) (communicated transfer status with NT) PT Visit Diagnosis: Muscle weakness (generalized) (M62.81);Other abnormalities of gait and mobility (R26.89);Unsteadiness on feet (R26.81);Difficulty in walking, not elsewhere classified (R26.2)     Time: 6213-0865 PT Time Calculation (min) (ACUTE ONLY): 10 min  Charges:  $Therapeutic Activity: 8-22 mins                     Becky Sax PT, DPT  Blenda Nicely 01/02/2023, 8:53 AM

## 2023-01-02 NOTE — Progress Notes (Signed)
   Heart Failure Stewardship Pharmacist Progress Note   PCP: Neale Burly, MD PCP-Cardiologist: Carlyle Dolly, MD    HPI:  67 yo F with PMH of CHF, CAD, HTN, glaucoma, hypothyroidism, OSA, T2DM, obesity, and arthritis.   Known ischemic cardiomyopathy since at least 2018. EF at that time was 30-35% and multivessel CAD on cath. Received 2 stents to circumflex. EF has remained in the 30-40% range since then.   She presented to the ED on 1/10 with shortness of breath and LE edema. Denies orthopnea. CXR with no acute cardiopulmonary disease and stable cardiomegaly. CTA showed large pericardial effusion and trace bilateral pleural fluid. ECHO 1/12 showed LVEF 30-35%, RV normal, findings suspicious for cardiac tamponade. Transferred to [facility] for repeat ECHO and possible pericardiocentesis. Attempt at pericardiocentesis unsuccessful. Pericardial window by CT surgery done 1/13.  Current HF Medications: Diuretic: furosemide 40 mg daily Beta Blocker: metoprolol XL 25 mg daily ACE/ARB/ARNI: Entresto 24/26 mg BID MRA: spironolactone 12.5 mg daily SGLT2i: Jardiance 25 mg daily  Prior to admission HF Medications: Diuretic: furosemide 40 mg daily Beta blocker: carvedilol 12.5 mg BID ACE/ARB/ARNI: Entresto 97/103 mg BID *not taking Jardiance or spironolactone   Pertinent Lab Values: Serum creatinine 1.56, BUN 24, Potassium 4.3, Sodium 138, BNP 74, Magnesium 2.1, A1c 7.2   Vital Signs: Weight: 335 lbs (admission weight: 352 lbs) Blood pressure: 110/70s  Heart rate: 60s  I/O: -1.4L yesterday; net -5.7L  Medication Assistance / Insurance Benefits Check: Does the patient have prescription insurance?  Yes Type of insurance plan: [facility] Medicare  Outpatient Pharmacy:  Prior to admission outpatient pharmacy: [facility] Is the patient willing to use [facility] at discharge? Yes Is the patient willing to transition their outpatient pharmacy to utilize a [facility] outpatient pharmacy?    Pending    Assessment: 1. Acute on chronic systolic CHF (LVEF 48-01%), due to ICM. NYHA class II symptoms. - Continue furosemide 40 mg PO daily - Continue metoprolol XL 25 mg daily - Continue Entresto 24/26 mg BID - Continue spironolactone 12.5 mg daily  - Continue Jardiance 25 mg daily - pt had been complaining of dysuria. Specifically denies any painful urination, burning, urinary frequency or urgency. WBC stable and no fever. Monitor closely.    Plan: 1) Medication changes recommended at this time: - Continue current regimen  2) Patient assistance: - None pending  3)  Education  - Patient has been educated on current HF medications and potential additions to HF medication regimen - Patient verbalizes understanding that over the next few months, these medication doses may change and more medications may be added to optimize HF regimen - Patient has been educated on basic disease state pathophysiology and goals of therapy   Kerby Nora, PharmD, BCPS Heart Failure Stewardship Pharmacist Phone [phone]

## 2023-01-02 NOTE — Plan of Care (Signed)
  Problem: Education: Goal: Knowledge of General Education information will improve Description: Including pain rating scale, medication(s)/side effects and non-pharmacologic comfort measures 01/02/2023 2115 by Jorge Mandril, RN Outcome: Adequate for Discharge 01/02/2023 2114 by Jorge Mandril, RN Outcome: Progressing   Problem: Health Behavior/Discharge Planning: Goal: Ability to manage health-related needs will improve 01/02/2023 2115 by Jorge Mandril, RN Outcome: Adequate for Discharge 01/02/2023 2114 by Jorge Mandril, RN Outcome: Progressing   Problem: Clinical Measurements: Goal: Ability to maintain clinical measurements within normal limits will improve 01/02/2023 2115 by Jorge Mandril, RN Outcome: Adequate for Discharge 01/02/2023 2114 by Jorge Mandril, RN Outcome: Progressing Goal: Will remain free from infection 01/02/2023 2115 by Jorge Mandril, RN Outcome: Adequate for Discharge 01/02/2023 2114 by Jorge Mandril, RN Outcome: Progressing

## 2023-01-02 NOTE — Progress Notes (Signed)
Mobility Specialist Progress Note   01/02/23 1015  Mobility  Activity Refused mobility (seated LE exercise)  Level of Assistance Standby assist, set-up cues, supervision of patient - no hands on  Assistive Device None  Range of Motion/Exercises Active;All extremities  Activity Response Tolerated well   Patient received in recliner deferred ambulation but agreeable to seated exercises. Performed with supervision but limited secondary to fatigue. Tolerated without complaint or incident. Was left in recliner with all needs met, call bell in reach.   [place] Ayde Record, BS EXP Mobility Specialist Please contact via SecureChat or Rehab office at [phone]

## 2023-01-02 NOTE — Progress Notes (Signed)
Pt transported with belongings by Corey Harold to [facility](SNF). Report already given to receiving nurse by day shift nurse. Discharge documentation transported with pt.Pt denies pain. Pt's sister notified.

## 2023-01-03 ENCOUNTER — Encounter (HOSPITAL_COMMUNITY): Payer: 59

## 2023-01-04 DIAGNOSIS — E1159 Type 2 diabetes mellitus with other circulatory complications: Secondary | ICD-10-CM | POA: Diagnosis not present

## 2023-01-04 DIAGNOSIS — H409 Unspecified glaucoma: Secondary | ICD-10-CM | POA: Diagnosis not present

## 2023-01-04 DIAGNOSIS — E890 Postprocedural hypothyroidism: Secondary | ICD-10-CM | POA: Diagnosis not present

## 2023-01-04 DIAGNOSIS — I255 Ischemic cardiomyopathy: Secondary | ICD-10-CM | POA: Diagnosis not present

## 2023-01-04 DIAGNOSIS — I2510 Atherosclerotic heart disease of native coronary artery without angina pectoris: Secondary | ICD-10-CM | POA: Diagnosis not present

## 2023-01-04 DIAGNOSIS — I5040 Unspecified combined systolic (congestive) and diastolic (congestive) heart failure: Secondary | ICD-10-CM | POA: Diagnosis not present

## 2023-01-04 DIAGNOSIS — I5020 Unspecified systolic (congestive) heart failure: Secondary | ICD-10-CM | POA: Diagnosis not present

## 2023-01-04 DIAGNOSIS — I314 Cardiac tamponade: Secondary | ICD-10-CM | POA: Diagnosis not present

## 2023-01-05 DIAGNOSIS — I255 Ischemic cardiomyopathy: Secondary | ICD-10-CM | POA: Diagnosis not present

## 2023-01-05 DIAGNOSIS — E039 Hypothyroidism, unspecified: Secondary | ICD-10-CM | POA: Diagnosis not present

## 2023-01-05 DIAGNOSIS — M6281 Muscle weakness (generalized): Secondary | ICD-10-CM | POA: Diagnosis not present

## 2023-01-05 DIAGNOSIS — I5040 Unspecified combined systolic (congestive) and diastolic (congestive) heart failure: Secondary | ICD-10-CM | POA: Diagnosis not present

## 2023-01-05 DIAGNOSIS — I110 Hypertensive heart disease with heart failure: Secondary | ICD-10-CM | POA: Diagnosis not present

## 2023-01-05 NOTE — Progress Notes (Unsigned)
[address]       [place] [address]             [phone]  HPI: This is a 67 yo with CAD (s/p PCI), ischemic cardiomyopathy, DM , obesity who was admitted with shortness of breath. On CT of the chest, she  was  noted to have large pericardial effusion. This was confirmed by echo and LVEF was EF 35%. [place] on plavix  so attempted to have pericardiocentesis; However, secondary to patient's body habitus, was unable to gain access. Patient then underwent a subxiphoid pericardial window and pericardial biopsy on 12/23/2022 by Dr. Lavonna Monarch. Pathology result of biopsy was fibromembranous tissue with minimal chronic inflammation ;negative for malignancy.   Current Outpatient Medications  Medication Sig Dispense Refill   acetaminophen (TYLENOL) 325 MG tablet Take 2 tablets (650 mg total) by mouth every 4 (four) hours as needed for mild pain, fever or headache. 12 tablet 0   atorvastatin (LIPITOR) 80 MG tablet TAKE 1 TABLET(80 MG) BY MOUTH DAILY (Patient taking differently: Take 80 mg by mouth every evening.) 30 tablet 6   calcium carbonate (TUMS - DOSED IN MG ELEMENTAL CALCIUM) 500 MG chewable tablet Chew 4 tablets by mouth every evening.     cetirizine (ZYRTEC) 10 MG tablet Take 10 mg by mouth daily as needed for allergies.   0   clopidogrel (PLAVIX) 75 MG tablet TAKE 1 TABLET(75 MG) BY MOUTH DAILY (Patient taking differently: Take 75 mg by mouth daily.) 90 tablet 1   COMBIGAN 0.2-0.5 % ophthalmic solution Place 1 drop into both eyes 2 (two) times daily.     diclofenac Sodium (VOLTAREN) 1 % GEL Apply 2 g topically daily as needed (arthritis).     furosemide (LASIX) 40 MG tablet Take 1 tablet (40 mg total) by mouth daily.     glucose blood (ONETOUCH VERIO) test strip Test glucose 4 times day. 150 each 2   insulin aspart (NOVOLOG) 100 UNIT/ML injection Inject 0-15 Units into the skin 3 (three) times daily with meals.     JARDIANCE 25 MG TABS tablet Take 25 mg by mouth  daily.     levothyroxine (SYNTHROID) 200 MCG tablet Take 200 mcg by mouth daily before breakfast. Take 1 tablet with 52mg for a total dose of 2560m daily     levothyroxine (SYNTHROID) 50 MCG tablet Take 50 mcg by mouth daily before breakfast. Take 1 tablet with 20070mfor a total dose of 250m8maily     metoprolol succinate (TOPROL-XL) 25 MG 24 hr tablet Take 1 tablet (25 mg total) by mouth daily.     OZEMPIC, 0.25 OR 0.5 MG/DOSE, 2 MG/1.5ML SOPN Inject 0.25 mg into the skin once a week. Thursday     sacubitril-valsartan (ENTRESTO) 24-26 MG Take 1 tablet by mouth 2 (two) times daily. 60 tablet 0   senna-docusate (SENOKOT-S) 8.6-50 MG tablet Take 2 tablets by mouth 2 (two) times daily. (Patient taking differently: Take 2 tablets by mouth daily as needed for mild constipation or moderate constipation.) 60 tablet 3   spironolactone (ALDACTONE) 25 MG tablet Take 0.5 tablets (12.5 mg total) by mouth daily. (Patient not taking: Reported on 08/26/2022) 45 tablet 3   TRESIBA FLEXTOUCH 100 UNIT/ML FlexTouch Pen Inject 10 Units into the skin at bedtime.     VENTOLIN HFA 108 (90 Base) MCG/ACT inhaler Inhale 1-2 puffs into the lungs every 6 (six) hours as needed for wheezing or shortness of breath. 18 g 1  Vital Signs:   Physical Exam: CV- Pulmonary Wound-  Diagnostic Tests: ***  Impression and Plan: ***    Nani Skillern, PA-C [facility] [phone]

## 2023-01-08 ENCOUNTER — Ambulatory Visit (INDEPENDENT_AMBULATORY_CARE_PROVIDER_SITE_OTHER): Payer: Self-pay | Admitting: Physician Assistant

## 2023-01-08 VITALS — BP 131/76 | HR 55 | Resp 18 | Ht 65.0 in

## 2023-01-08 DIAGNOSIS — Z9889 Other specified postprocedural states: Secondary | ICD-10-CM

## 2023-01-08 DIAGNOSIS — I3139 Other pericardial effusion (noninflammatory): Secondary | ICD-10-CM

## 2023-01-08 DIAGNOSIS — K5900 Constipation, unspecified: Secondary | ICD-10-CM | POA: Diagnosis not present

## 2023-01-09 ENCOUNTER — Encounter: Payer: Self-pay | Admitting: Physician Assistant

## 2023-01-10 ENCOUNTER — Encounter (HOSPITAL_COMMUNITY): Payer: 59

## 2023-01-10 DIAGNOSIS — K5900 Constipation, unspecified: Secondary | ICD-10-CM | POA: Diagnosis not present

## 2023-01-11 ENCOUNTER — Telehealth (HOSPITAL_COMMUNITY): Payer: Self-pay

## 2023-01-11 NOTE — Telephone Encounter (Signed)
Called to confirm Heart & Vascular Transitions of Care appointment at 01/12/23. Patient reminded to bring all medications and pill box organizer with them. Confirmed patient has transportation. Gave directions, instructed to utilize [facility] parking.  Confirmed appointment prior to ending call.

## 2023-01-12 ENCOUNTER — Encounter (HOSPITAL_COMMUNITY): Payer: 59

## 2023-01-12 DIAGNOSIS — E039 Hypothyroidism, unspecified: Secondary | ICD-10-CM | POA: Diagnosis not present

## 2023-01-12 DIAGNOSIS — I255 Ischemic cardiomyopathy: Secondary | ICD-10-CM | POA: Diagnosis not present

## 2023-01-12 DIAGNOSIS — M6281 Muscle weakness (generalized): Secondary | ICD-10-CM | POA: Diagnosis not present

## 2023-01-12 DIAGNOSIS — I314 Cardiac tamponade: Secondary | ICD-10-CM | POA: Diagnosis not present

## 2023-01-12 DIAGNOSIS — I5040 Unspecified combined systolic (congestive) and diastolic (congestive) heart failure: Secondary | ICD-10-CM | POA: Diagnosis not present

## 2023-01-12 DIAGNOSIS — I110 Hypertensive heart disease with heart failure: Secondary | ICD-10-CM | POA: Diagnosis not present

## 2023-01-12 DIAGNOSIS — E1159 Type 2 diabetes mellitus with other circulatory complications: Secondary | ICD-10-CM | POA: Diagnosis not present

## 2023-01-13 ENCOUNTER — Other Ambulatory Visit: Payer: Self-pay | Admitting: "Endocrinology

## 2023-01-13 DIAGNOSIS — E1159 Type 2 diabetes mellitus with other circulatory complications: Secondary | ICD-10-CM

## 2023-01-16 ENCOUNTER — Telehealth (HOSPITAL_COMMUNITY): Payer: Self-pay

## 2023-01-16 DIAGNOSIS — I314 Cardiac tamponade: Secondary | ICD-10-CM | POA: Diagnosis not present

## 2023-01-16 DIAGNOSIS — E1159 Type 2 diabetes mellitus with other circulatory complications: Secondary | ICD-10-CM | POA: Diagnosis not present

## 2023-01-16 DIAGNOSIS — E785 Hyperlipidemia, unspecified: Secondary | ICD-10-CM | POA: Diagnosis not present

## 2023-01-16 DIAGNOSIS — I255 Ischemic cardiomyopathy: Secondary | ICD-10-CM | POA: Diagnosis not present

## 2023-01-16 DIAGNOSIS — I2510 Atherosclerotic heart disease of native coronary artery without angina pectoris: Secondary | ICD-10-CM | POA: Diagnosis not present

## 2023-01-16 DIAGNOSIS — Z9889 Other specified postprocedural states: Secondary | ICD-10-CM | POA: Diagnosis not present

## 2023-01-16 DIAGNOSIS — I10 Essential (primary) hypertension: Secondary | ICD-10-CM | POA: Diagnosis not present

## 2023-01-16 DIAGNOSIS — I5040 Unspecified combined systolic (congestive) and diastolic (congestive) heart failure: Secondary | ICD-10-CM | POA: Diagnosis not present

## 2023-01-16 NOTE — Telephone Encounter (Signed)
Called to confirm Heart & Vascular Transitions of Care appointment at 01/17/23. Patient reminded to bring all medications and pill box organizer with them. Gave directions, instructed to utilize [facility] parking.  Left message to confirm appointment.

## 2023-01-17 ENCOUNTER — Ambulatory Visit (HOSPITAL_COMMUNITY)
Admission: RE | Admit: 2023-01-17 | Discharge: 2023-01-17 | Disposition: A | Discharge status: Home / Self Care | Payer: 59 | Source: Ambulatory Visit | Attending: Cardiology | Admitting: Cardiology

## 2023-01-17 ENCOUNTER — Encounter (HOSPITAL_COMMUNITY): Payer: Self-pay

## 2023-01-17 VITALS — BP 94/76 | HR 74 | Wt 350.0 lb

## 2023-01-17 DIAGNOSIS — I5022 Chronic systolic (congestive) heart failure: Secondary | ICD-10-CM | POA: Diagnosis not present

## 2023-01-17 DIAGNOSIS — R531 Weakness: Secondary | ICD-10-CM | POA: Insufficient documentation

## 2023-01-17 DIAGNOSIS — Z6841 Body Mass Index (BMI) 40.0 and over, adult: Secondary | ICD-10-CM | POA: Diagnosis not present

## 2023-01-17 DIAGNOSIS — I130 Hypertensive heart and chronic kidney disease with heart failure and stage 1 through stage 4 chronic kidney disease, or unspecified chronic kidney disease: Secondary | ICD-10-CM | POA: Insufficient documentation

## 2023-01-17 DIAGNOSIS — N1831 Chronic kidney disease, stage 3a: Secondary | ICD-10-CM | POA: Insufficient documentation

## 2023-01-17 DIAGNOSIS — I5042 Chronic combined systolic (congestive) and diastolic (congestive) heart failure: Secondary | ICD-10-CM | POA: Diagnosis not present

## 2023-01-17 DIAGNOSIS — R42 Dizziness and giddiness: Secondary | ICD-10-CM | POA: Diagnosis not present

## 2023-01-17 DIAGNOSIS — E1122 Type 2 diabetes mellitus with diabetic chronic kidney disease: Secondary | ICD-10-CM | POA: Diagnosis not present

## 2023-01-17 DIAGNOSIS — I2510 Atherosclerotic heart disease of native coronary artery without angina pectoris: Secondary | ICD-10-CM | POA: Insufficient documentation

## 2023-01-17 DIAGNOSIS — Z794 Long term (current) use of insulin: Secondary | ICD-10-CM | POA: Insufficient documentation

## 2023-01-17 DIAGNOSIS — Z955 Presence of coronary angioplasty implant and graft: Secondary | ICD-10-CM | POA: Diagnosis not present

## 2023-01-17 DIAGNOSIS — E890 Postprocedural hypothyroidism: Secondary | ICD-10-CM | POA: Diagnosis not present

## 2023-01-17 DIAGNOSIS — Z7902 Long term (current) use of antithrombotics/antiplatelets: Secondary | ICD-10-CM | POA: Insufficient documentation

## 2023-01-17 DIAGNOSIS — Z7984 Long term (current) use of oral hypoglycemic drugs: Secondary | ICD-10-CM | POA: Diagnosis not present

## 2023-01-17 DIAGNOSIS — I3139 Other pericardial effusion (noninflammatory): Secondary | ICD-10-CM | POA: Diagnosis not present

## 2023-01-17 DIAGNOSIS — E039 Hypothyroidism, unspecified: Secondary | ICD-10-CM

## 2023-01-17 DIAGNOSIS — Z79899 Other long term (current) drug therapy: Secondary | ICD-10-CM | POA: Insufficient documentation

## 2023-01-17 DIAGNOSIS — Z79890 Hormone replacement therapy (postmenopausal): Secondary | ICD-10-CM | POA: Diagnosis not present

## 2023-01-17 LAB — COMPREHENSIVE METABOLIC PANEL
ALT: 28 U/L (ref 0–44)
AST: 37 U/L (ref 15–41)
Albumin: 2.8 g/dL — ABNORMAL LOW (ref 3.5–5.0)
Alkaline Phosphatase: 219 U/L — ABNORMAL HIGH (ref 38–126)
Anion gap: 10 (ref 5–15)
BUN: 33 mg/dL — ABNORMAL HIGH (ref 8–23)
CO2: 29 mmol/L (ref 22–32)
Calcium: 7.9 mg/dL — ABNORMAL LOW (ref 8.9–10.3)
Chloride: 101 mmol/L (ref 98–111)
Creatinine, Ser: 1.66 mg/dL — ABNORMAL HIGH (ref 0.44–1.00)
GFR, Estimated: 34 mL/min — ABNORMAL LOW (ref >60)
Glucose, Bld: 134 mg/dL — ABNORMAL HIGH (ref 70–99)
Potassium: 4.4 mmol/L (ref 3.5–5.1)
Sodium: 140 mmol/L (ref 135–145)
Total Bilirubin: 0.6 mg/dL (ref 0.3–1.2)
Total Protein: 6.6 g/dL (ref 6.5–8.1)

## 2023-01-17 LAB — BRAIN NATRIURETIC PEPTIDE: B Natriuretic Peptide: 142.5 pg/mL — ABNORMAL HIGH (ref 0.0–100.0)

## 2023-01-17 LAB — TSH: TSH: 26.048 u[IU]/mL — ABNORMAL HIGH (ref 0.350–4.500)

## 2023-01-17 LAB — T4, FREE: Free T4: 0.92 ng/dL (ref 0.61–1.12)

## 2023-01-17 NOTE — Progress Notes (Signed)
HEART & VASCULAR TRANSITION OF CARE CONSULT NOTE     Referring Physician: Dr. Marlou Porch  Primary Care: Neale Burly, MD Primary Cardiologist: Carlyle Dolly, MD   HPI: Referred to clinic by Dr. Marlou Porch for heart failure consultation.   67 y/o female w/ chronic systolic heart failure due to ICM, CAD, T2DM, HTN, CKD IIIa, h/o thyroidectomy w/ secondary hypothyroidism on HRT w/ levothyroxine.   Diagnosed w/ systolic heart failure 03/9448. Echo w/ reduced EF 30-35%, RV normal. + moderate pericardial effusion. LHC showed severe multivessel CAD. She was evaluated by CT surgery but deemed not a candidate for CABG given diabetic disease pattern with significant distal stenoses not amenable to CABG. Underwent 2V PCI  to prox-mid LAD and prox-mid RCA. LCX disease was treated medically.   EF has fluctuated, ranging from 30-45%. Does not have ICD. Also w/ h/o chronic pericardial effusions w/ several echocardiograms noting moderate size effusions. Had recent admit 1/24 for a/c CHF and dyspnea. Echo showed EF 30-35% + large pericardial effusion w/ tamponade physiology. Pateinet on plavix  so attempted to have pericardiocentesis; However, secondary to patient's body habitus, was unable to gain access. Patient then underwent a subxiphoid pericardial window and pericardial biopsy on 12/23/2022 by Dr. Lavonna Monarch. Pathology result of biopsy was fibromembranous tissue with minimal chronic inflammation; negative for malignancy. Discharged home on GDMT + PO Lasix. Referred to [facility] clinic. D/c wt 349 lb. D/ced to SNF.   Presents today for f/u. In [place]. Continues w/ rehab. Continues w/ LE weakness limiting mobility, so still not very active. Denies resting dyspnea. No orthopnea/ PND. No extremely SOB w/ PT. No CP. Has LEE on exam, though chronic. BP soft today 94/76. She notes some positional dizziness if she stands too quickly.    Review of Systems: [y] = yes, '[ ]'$  = no   General: Weight gain '[ ]'$ ; Weight loss '[ ]'$ ;  Anorexia '[ ]'$ ; Fatigue '[ ]'$ ; Fever '[ ]'$ ; Chills '[ ]'$ ; Weakness [Y ]  Cardiac: Chest pain/pressure '[ ]'$ ; Resting SOB '[ ]'$ ; Exertional SOB '[ ]'$ ; Orthopnea '[ ]'$ ; Pedal Edema [Y ]; Palpitations '[ ]'$ ; Syncope '[ ]'$ ; Presyncope '[ ]'$ ; Paroxysmal nocturnal dyspnea'[ ]'$   Pulmonary: Cough '[ ]'$ ; Wheezing'[ ]'$ ; Hemoptysis'[ ]'$ ; Sputum '[ ]'$ ; Snoring '[ ]'$   GI: Vomiting'[ ]'$ ; Dysphagia'[ ]'$ ; Melena'[ ]'$ ; Hematochezia '[ ]'$ ; Heartburn'[ ]'$ ; Abdominal pain '[ ]'$ ; Constipation '[ ]'$ ; Diarrhea '[ ]'$ ; BRBPR '[ ]'$   GU: Hematuria'[ ]'$ ; Dysuria '[ ]'$ ; Nocturia'[ ]'$   Vascular: Pain in legs with walking '[ ]'$ ; Pain in feet with lying flat '[ ]'$ ; Non-healing sores '[ ]'$ ; Stroke '[ ]'$ ; TIA '[ ]'$ ; Slurred speech '[ ]'$ ;  Neuro: Headaches'[ ]'$ ; Vertigo'[ ]'$ ; Seizures'[ ]'$ ; Paresthesias'[ ]'$ ;Blurred vision '[ ]'$ ; Diplopia '[ ]'$ ; Vision changes '[ ]'$   Ortho/Skin: Arthritis '[ ]'$ ; Joint pain '[ ]'$ ; Muscle pain '[ ]'$ ; Joint swelling '[ ]'$ ; Back Pain '[ ]'$ ; Rash '[ ]'$   Psych: Depression'[ ]'$ ; Anxiety'[ ]'$   Heme: Bleeding problems '[ ]'$ ; Clotting disorders '[ ]'$ ; Anemia '[ ]'$   Endocrine: Diabetes '[ ]'$ ; Thyroid dysfunction'[ ]'$    Past Medical History:  Diagnosis Date   Arthritis    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 multivessel CAD by cath in 09/2017 and not felt to be a CABG candidate --> underwent two-vessel PCI with DES to the  LAD and DES to the RCA   Diabetes mellitus    Diabetes mellitus, type II ([place])    Dyspnea    Glaucoma    Hypertension    Left bundle branch block    Morbid obesity (HCC)    Obstructive sleep apnea    does not wear CPAP   Thyroid disease     Current Outpatient Medications  Medication Sig Dispense Refill   acetaminophen (TYLENOL) 325 MG tablet Take 2 tablets (650 mg total) by mouth every 4 (four) hours as needed for mild pain, fever or headache. 12 tablet 0   atorvastatin (LIPITOR) 80 MG tablet TAKE 1 TABLET(80 MG) BY MOUTH DAILY (Patient taking differently:  Take 80 mg by mouth every evening.) 30 tablet 6   calcium carbonate (TUMS - DOSED IN MG ELEMENTAL CALCIUM) 500 MG chewable tablet Chew 4 tablets by mouth every evening.     cetirizine (ZYRTEC) 10 MG tablet Take 10 mg by mouth daily as needed for allergies.   0   clopidogrel (PLAVIX) 75 MG tablet TAKE 1 TABLET(75 MG) BY MOUTH DAILY (Patient taking differently: Take 75 mg by mouth daily.) 90 tablet 1   COMBIGAN 0.2-0.5 % ophthalmic solution Place 1 drop into both eyes 2 (two) times daily.     diclofenac Sodium (VOLTAREN) 1 % GEL Apply 2 g topically daily as needed (arthritis).     furosemide (LASIX) 40 MG tablet Take 1 tablet (40 mg total) by mouth daily.     glucose blood (ONETOUCH VERIO) test strip Test glucose 4 times day. 150 each 2   insulin aspart (NOVOLOG) 100 UNIT/ML injection Inject 0-15 Units into the skin 3 (three) times daily with meals.     JARDIANCE 25 MG TABS tablet Take 25 mg by mouth daily.     levothyroxine (SYNTHROID) 200 MCG tablet Take 200 mcg by mouth daily before breakfast. Take 1 tablet with 49mg for a total dose of 2528m daily     levothyroxine (SYNTHROID) 50 MCG tablet Take 50 mcg by mouth daily before breakfast. Take 1 tablet with 20022mfor a total dose of 250m75maily     metoprolol succinate (TOPROL-XL) 25 MG 24 hr tablet Take 1 tablet (25 mg total) by mouth daily.     OZEMPIC, 0.25 OR 0.5 MG/DOSE, 2 MG/1.5ML SOPN Inject 0.25 mg into the skin once a week. Thursday     sacubitril-valsartan (ENTRESTO) 24-26 MG Take 1 tablet by mouth 2 (two) times daily. 60 tablet 0   senna-docusate (SENOKOT-S) 8.6-50 MG tablet Take 2 tablets by mouth 2 (two) times daily. (Patient taking differently: Take 2 tablets by mouth daily as needed for mild constipation or moderate constipation.) 60 tablet 3   spironolactone (ALDACTONE) 25 MG tablet Take 0.5 tablets (12.5 mg total) by mouth daily. 45 tablet 3   TRESIBA FLEXTOUCH 100 UNIT/ML FlexTouch Pen Inject 10 Units into the skin at bedtime.      VENTOLIN HFA 108 (90 Base) MCG/ACT inhaler Inhale 1-2 puffs into the lungs every 6 (six) hours as needed for wheezing or shortness of breath. 18 g 1   No current facility-administered medications for this encounter.    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, he passed"  Tobacco Use   Smoking status: Former    Types: Cigarettes    Quit date: 12/14/2005    Years since quitting: 17.1   Smokeless tobacco: Never   Tobacco comments:    QUIT IN 2005  Vaping Use   Vaping Use: Never used  Substance and Sexual Activity   Alcohol use: No   Drug use: No   Sexual activity: Never  Other Topics Concern   Not on file  Social History Narrative   Not on file   Social Determinants of Health   Financial Resource Strain: Low Risk  (12/22/2022)   Overall Financial Resource Strain (CARDIA)    Difficulty of Paying Living Expenses: Not very hard  Food Insecurity: No Food Insecurity (12/26/2022)   Hunger Vital Sign    Worried About Running Out of Food in the Last Year: Never true    [place] in the Last Year: Never true  Transportation Needs: No Transportation Needs (12/22/2022)   PRAPARE - Hydrologist (Medical): No    Lack of Transportation (Non-Medical): No  Physical Activity: Not on file  Stress: Not on file  Social Connections: Not on file  Intimate Partner Violence: Not At Risk (12/26/2022)   Humiliation, Afraid, Rape, and Kick questionnaire    Fear of Current or  Ex-Partner: No    Emotionally Abused: No    Physically Abused: No    Sexually Abused: No      Family History  Problem Relation Age of Onset   Diabetes Mother    Hypertension Mother    Cancer Mother        pancreas   Hypertension Sister     Vitals:   01/17/23 1300  Weight: (!) 158.8 kg (350 lb)    PHYSICAL EXAM: General: obese, chronically ill appearing, looks older than actual age, in [facility]. No respiratory difficulty HEENT: normal Neck: supple. Thick neck, JVD not well visualized. Carotids 2+ bilat; no bruits. No lymphadenopathy or thryomegaly appreciated. Cor: PMI nondisplaced. Regular rate & rhythm. No rubs, gallops or murmurs. Lungs: clear Abdomen: soft, nontender, nondistended. No hepatosplenomegaly. No bruits or masses. Good bowel sounds. Extremities: no cyanosis, clubbing, rash, 1+ b/l no pitting pedal edema  Neuro: alert & oriented x 3, cranial nerves grossly intact. moves all 4 extremities w/o difficulty. Affect pleasant.  ECG: not performed    ASSESSMENT & PLAN:  1. Chronic Systolic Heart Failure - ICM, dates back to 2018. Cath w/ severe MVCAD, not candidate for CABG (diabetic vessels), underwent 2V PCI to LAD and RCA. LCx managed medically - EF has fluctuated, 30-45%. Most recent echo 1/24 EF 30-35%, RV ok  - Chronically NYHA Class III, confounded by morbid obesity and deconditioning - Volume assessment difficult given body habitus but wt has remained stable since hospital d/c. Will check BNP to help guide diuretic dose adjustments  - Continue Lasix 40 mg daily - GDMT titration limited today due to soft BP  - Continue Jardiance 25 mg daily  - Continue Entresto 24-26 mg bid - Continue Spiro 12.5 mg bid - Continue Toprol XL 25 mg daily  - continue f/u w/ cardiology. Not candidate for advanced therapies given BMI and other  co morbidities    2. Pericardial Effusion  - large effusion w/ tamponade physiology 1/24 - s/p pericardial window 1/24  - Pathology result  of biopsy was fibromembranous tissue with minimal chronic inflammation; negative for malignancy - per chart review, she has had chronic pericardial effusions w/ several echocardiograms noting moderate size effusions. Also h/o thyroidectomy w/ secondary hypothyroidism on HRT w/ levothyroxine. ? If hypothyroidism contributing. No recent TFT in record. Check TSH, Free T3/T4. Will forward results to PCP if dose titration is needed.   3. CAD - MVCAD per above, s/p LAD and RCA PCI in 2018. LCX dz treated medically  - stable w/o angina - continue Plavix, statin + ? blocker  4. T2DM  - per PCP  - on SGLT2i for HF/ CV risk reduction  - on Ozempic   5. Obesity  - Body mass index is 58.24 kg/m. - on Ozempic   6. Secondary Hypothyroidism  - s/p thyroidectomy  - on HRT w/ Levothyroxine  - given pericardial effusions will check TSH/ Free T3/T4      Referred to HFSW (PCP, Medications, Transportation, ETOH Abuse, Drug Abuse, Insurance, Financial ): No  Refer to Pharmacy: No  Refer to Home Health: No Refer to Advanced Heart Failure Clinic: No  Refer to General Cardiology: Yes (Dr. Zandra Abts)   Follow up: keep scheduled f/u w/ Dr. Harl Bowie on 01/31/23

## 2023-01-17 NOTE — Patient Instructions (Addendum)
Labs done today. We will contact you only if your labs are abnormal.  No medication changes were made. Please continue all current medications as prescribed.  Thank you for allowing [place] to provide your heart failure care after your recent hospitalization. Please follow-up with General Cardiology.

## 2023-01-19 LAB — T3, FREE: T3, Free: 1.6 pg/mL — ABNORMAL LOW (ref 2.0–4.4)

## 2023-01-22 ENCOUNTER — Ambulatory Visit: Payer: 59 | Admitting: Thoracic Surgery (Cardiothoracic Vascular Surgery)

## 2023-01-23 ENCOUNTER — Telehealth (HOSPITAL_COMMUNITY): Payer: Self-pay | Admitting: *Deleted

## 2023-01-23 ENCOUNTER — Other Ambulatory Visit: Payer: Self-pay | Admitting: Thoracic Surgery (Cardiothoracic Vascular Surgery)

## 2023-01-23 DIAGNOSIS — I5042 Chronic combined systolic (congestive) and diastolic (congestive) heart failure: Secondary | ICD-10-CM

## 2023-01-23 DIAGNOSIS — Z9889 Other specified postprocedural states: Secondary | ICD-10-CM

## 2023-01-23 MED ORDER — FUROSEMIDE 40 MG PO TABS: 60.0000 mg | ORAL_TABLET | Freq: Every day | ORAL | Status: DC

## 2023-01-23 NOTE — Telephone Encounter (Signed)
Scarlette Calico, RN 01/23/2023  4:56 PM EST Back to Top    Called [facility], pt was d/c'd home. Called and spoke w/pt, she is aware, agreeable, and verbalized understanding. Pt is sch with Dr Harl Bowie next week, place note for them to repeat labs.   Asencion Noble, CMA 01/19/2023  2:18 PM EST     Left message for her to call back

## 2023-01-23 NOTE — Telephone Encounter (Signed)
-----   Message from [place], [place] sent at 01/19/2023 11:38 AM EST ----- Needs to increase lasix to 60 mg daily and get repeat BMP at SNF in 1 wk.   Also labs c/w hypothyroidism. She is on levothyroxine. Hospital team made adjustments and noted to follow up with endocrinology (Dr. Dorris Fetch). Make sure she follows up with them for further adjustment.

## 2023-01-24 ENCOUNTER — Ambulatory Visit
Admission: RE | Admit: 2023-01-24 | Discharge: 2023-01-24 | Disposition: A | Discharge status: Home / Self Care | Payer: 59 | Source: Ambulatory Visit | Attending: Thoracic Surgery (Cardiothoracic Vascular Surgery) | Admitting: Thoracic Surgery (Cardiothoracic Vascular Surgery)

## 2023-01-24 ENCOUNTER — Encounter: Payer: Self-pay | Admitting: Thoracic Surgery (Cardiothoracic Vascular Surgery)

## 2023-01-24 ENCOUNTER — Ambulatory Visit (INDEPENDENT_AMBULATORY_CARE_PROVIDER_SITE_OTHER): Payer: 59 | Admitting: Thoracic Surgery (Cardiothoracic Vascular Surgery)

## 2023-01-24 VITALS — BP 170/81 | HR 85 | Resp 20 | Ht 65.0 in | Wt 340.0 lb

## 2023-01-24 DIAGNOSIS — I3139 Other pericardial effusion (noninflammatory): Secondary | ICD-10-CM

## 2023-01-24 DIAGNOSIS — Z9889 Other specified postprocedural states: Secondary | ICD-10-CM

## 2023-01-24 DIAGNOSIS — R0602 Shortness of breath: Secondary | ICD-10-CM | POA: Diagnosis not present

## 2023-01-24 NOTE — Progress Notes (Signed)
[address]       [place] [address]             [phone]           Evan N Chesler [place] Medical Record [id number] Date of Birth: Feb 13, 1956  [name], MD Neale Burly, MD  Chief Complaint:   sp subxyphoid window  History of Present Illness:     Pt is sp subxyphoid window for pericardial effusion. She says her breasthing is better but having burning in feet when she walks. 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 multivessel CAD by cath in 09/2017 and not felt to be a CABG candidate --> underwent two-vessel PCI with DES to the LAD and DES to the RCA   Diabetes mellitus    Diabetes mellitus, type II ([place])    Dyspnea    Glaucoma    Hypertension    Left bundle branch block    Morbid obesity ([place])    Obstructive sleep apnea    does not wear CPAP   Thyroid disease     Past Surgical History:  Procedure Laterality Date   ABDOMINAL HYSTERECTOMY     CARDIAC CATHETERIZATION  09/12/2017   CORONARY STENT INTERVENTION  09/12/2017   STENT RESOLUTE ONYX [id number] drug eluting stent was successfully placed   CORONARY STENT INTERVENTION N/A 09/12/2017   Procedure: CORONARY STENT INTERVENTION;  Surgeon: Leonie Man, MD;  Location: [facility];  Service: Cardiovascular;  Laterality: N/A;   INCISION AND DRAINAGE PERIRECTAL ABSCESS Left 09/16/2019   Procedure: IRRIGATION AND DEBRIDEMENT LABIA ABSCESS;  Surgeon: Coralie Keens, MD;  Location: [facility];  Service: General;  Laterality: Left;   IR FLUORO GUIDE CV LINE RIGHT  09/16/2019   IR US GUIDE VASC ACCESS RIGHT  09/16/2019   LEFT HEART CATH AND CORONARY ANGIOGRAPHY N/A 09/12/2017   Procedure: LEFT HEART CATH AND CORONARY ANGIOGRAPHY;  Surgeon: Leonie Man, MD;   Location: [facility];  Service: Cardiovascular;  Laterality: N/A;   MASS EXCISION N/A 12/18/2018   Procedure: EXCISION TONGUE MASS;  Surgeon: Leta Baptist, MD;  Location: [facility] OR;  Service: ENT;  Laterality: N/A;   PERICARDIOCENTESIS N/A 12/22/2022   Procedure: PERICARDIOCENTESIS;  Surgeon: [name], MD;  Location: [facility];  Service: Cardiovascular;  Laterality: N/A;   RIGHT/LEFT HEART CATH AND CORONARY ANGIOGRAPHY N/A 09/10/2017   Procedure: RIGHT/LEFT HEART CATH AND CORONARY ANGIOGRAPHY;  Surgeon: Troy Sine, MD;  Location: [facility];  Service: Cardiovascular;  Laterality: N/A;   SUBXYPHOID PERICARDIAL WINDOW N/A 12/23/2022   Procedure: SUBXYPHOID PERICARDIAL WINDOW;  Surgeon: Coralie Common, MD;  Location: [facility];  Service: Thoracic;  Laterality: N/A;   TEE WITHOUT CARDIOVERSION N/A 12/23/2022   Procedure: TRANSESOPHAGEAL ECHOCARDIOGRAM (TEE);  Surgeon: Coralie Common, MD;  Location: [facility] OR;  Service: Thoracic;  Laterality: N/A;   THYROID SURGERY      Social History   Tobacco Use  Smoking Status Former   Types: Cigarettes   Quit date: 12/14/2005   Years since quitting: 17.1  Smokeless Tobacco Never  Tobacco Comments   QUIT IN 2005    Social  History   Substance and Sexual Activity  Alcohol Use No    Social History   Socioeconomic History   Marital status: Widowed    Spouse name: Not on file   Number of children: 1   Years of education: Not on file   Highest education level: 11th grade  Occupational History   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, he passed"  Tobacco Use   Smoking status: Former    Types: Cigarettes    Quit date: 12/14/2005    Years since quitting: 17.1   Smokeless tobacco: Never   Tobacco comments:    QUIT IN 2005  Vaping Use   Vaping Use: Never used  Substance and Sexual Activity   Alcohol use: No   Drug use: No   Sexual activity: Never  Other Topics Concern   Not on file  Social History Narrative   Not on file    Social Determinants of Health   Financial Resource Strain: Low Risk  (12/22/2022)   Overall Financial Resource Strain (CARDIA)    Difficulty of Paying Living Expenses: Not very hard  Food Insecurity: No Food Insecurity (12/26/2022)   Hunger Vital Sign    Worried About Running Out of Food in the Last Year: Never true    [place] in the Last Year: Never true  Transportation Needs: No Transportation Needs (12/22/2022)   PRAPARE - Hydrologist (Medical): No    Lack of Transportation (Non-Medical): No  Physical Activity: Not on file  Stress: Not on file  Social Connections: Not on file  Intimate Partner Violence: Not At Risk (12/26/2022)   Humiliation, Afraid, Rape, and Kick questionnaire    Fear of Current or Ex-Partner: No    Emotionally Abused: No    Physically Abused: No    Sexually Abused: No    Allergies  Allergen Reactions   Ampicillin Other (See Comments)    "Allergic," per MAR   Aspirin Nausea Only   Hydrocodone Other (See Comments)    "Allergic," per MAR   Unasyn [Ampicillin-Sulbactam Sodium] Rash and Other (See Comments)    "Allergic," per [facility]    Current Outpatient Medications  Medication Sig Dispense Refill   acetaminophen (TYLENOL) 325 MG tablet Take 2 tablets (650 mg total) by mouth every 4 (four) hours as needed for mild pain, fever or headache. 12 tablet 0   atorvastatin (LIPITOR) 80 MG tablet TAKE 1 TABLET(80 MG) BY MOUTH DAILY (Patient taking differently: Take 80 mg by mouth every evening.) 30 tablet 6   calcium carbonate (TUMS - DOSED IN MG ELEMENTAL CALCIUM) 500 MG chewable tablet Chew 4 tablets by mouth every evening.     cetirizine (ZYRTEC) 10 MG tablet Take 10 mg by mouth daily as needed for allergies.   0   clopidogrel (PLAVIX) 75 MG tablet TAKE 1 TABLET(75 MG) BY MOUTH DAILY (Patient taking differently: Take 75 mg by mouth daily.) 90 tablet 1   COMBIGAN 0.2-0.5 % ophthalmic solution Place 1 drop into both eyes 2 (two)  times daily.     diclofenac Sodium (VOLTAREN) 1 % GEL Apply 2 g topically daily as needed (arthritis).     furosemide (LASIX) 40 MG tablet Take 1.5 tablets (60 mg total) by mouth daily. 30 tablet    glucose blood (ONETOUCH VERIO) test strip Test glucose 4 times day. 150 each 2   insulin aspart (NOVOLOG) 100 UNIT/ML injection Inject 0-15 Units into the skin 3 (three) times daily with meals.  JARDIANCE 25 MG TABS tablet Take 25 mg by mouth daily.     levothyroxine (SYNTHROID) 200 MCG tablet Take 200 mcg by mouth daily before breakfast. Take 1 tablet with 70mg for a total dose of 2562m daily     levothyroxine (SYNTHROID) 50 MCG tablet Take 50 mcg by mouth daily before breakfast. Take 1 tablet with 2008mfor a total dose of 250m33maily     metoprolol succinate (TOPROL-XL) 25 MG 24 hr tablet Take 1 tablet (25 mg total) by mouth daily.     OZEMPIC, 0.25 OR 0.5 MG/DOSE, 2 MG/1.5ML SOPN Inject 0.25 mg into the skin once a week. Thursday     sacubitril-valsartan (ENTRESTO) 24-26 MG Take 1 tablet by mouth 2 (two) times daily. 60 tablet 0   senna-docusate (SENOKOT-S) 8.6-50 MG tablet Take 2 tablets by mouth 2 (two) times daily. (Patient taking differently: Take 2 tablets by mouth daily as needed for mild constipation or moderate constipation.) 60 tablet 3   spironolactone (ALDACTONE) 25 MG tablet Take 0.5 tablets (12.5 mg total) by mouth daily. 45 tablet 3   TRESIBA FLEXTOUCH 100 UNIT/ML FlexTouch Pen Inject 10 Units into the skin at bedtime.     VENTOLIN HFA 108 (90 Base) MCG/ACT inhaler Inhale 1-2 puffs into the lungs every 6 (six) hours as needed for wheezing or shortness of breath. 18 g 1   No current facility-administered medications for this visit.     Family History  Problem Relation Age of Onset   Diabetes Mother    Hypertension Mother    Cancer Mother        pancreas   Hypertension Sister        Physical Exam: Sternal incision with some mild separation inferiorly but overall  clean Card: rr Lungs; distant bs Ext; mild edema     Diagnostic Studies & Laboratory data: I have personally reviewed the following studies and agree with the findings     Recent Radiology Findings:   No results found.    Recent Lab Findings: Lab Results  Component Value Date   WBC 8.4 01/01/2023   HGB 9.8 (L) 01/01/2023   HCT 31.1 (L) 01/01/2023   PLT 204 01/01/2023   GLUCOSE 134 (H) 01/17/2023   CHOL 147 07/17/2018   TRIG 124 12/26/2019   HDL 31 (L) 07/17/2018   LDLCALC 83 07/17/2018   ALT 28 01/17/2023   AST 37 01/17/2023   NA 140 01/17/2023   K 4.4 01/17/2023   CL 101 01/17/2023   CREATININE 1.66 (H) 01/17/2023   BUN 33 (H) 01/17/2023   CO2 29 01/17/2023   TSH 26.048 (H) 01/17/2023   INR 1.0 12/08/2022   HGBA1C 7.2 (H) 12/20/2022      Assessment / Plan:     SP pericardial drainage via subxyphoid window Incision healing well. She is to see cardiology next week. CXR with clear lung fields but with ongoing enlarged heart sillouette.    I have spent 20 min in review of the records, viewing studies and in face to face with patient and in coordination of future care    PaulCoralie Common4/2024 7:53 AM

## 2023-01-24 NOTE — Patient Instructions (Signed)
Follow up with Cardiology

## 2023-01-31 ENCOUNTER — Ambulatory Visit: Payer: 59 | Attending: Cardiology | Admitting: Cardiology

## 2023-01-31 ENCOUNTER — Encounter: Payer: Self-pay | Admitting: Cardiology

## 2023-01-31 VITALS — BP 130/80 | HR 78 | Ht 65.0 in | Wt 338.6 lb

## 2023-01-31 DIAGNOSIS — Z79899 Other long term (current) drug therapy: Secondary | ICD-10-CM

## 2023-01-31 DIAGNOSIS — I3139 Other pericardial effusion (noninflammatory): Secondary | ICD-10-CM

## 2023-01-31 DIAGNOSIS — I5022 Chronic systolic (congestive) heart failure: Secondary | ICD-10-CM | POA: Diagnosis not present

## 2023-01-31 DIAGNOSIS — I2510 Atherosclerotic heart disease of native coronary artery without angina pectoris: Secondary | ICD-10-CM | POA: Diagnosis not present

## 2023-01-31 NOTE — Patient Instructions (Addendum)
Medication Instructions:  Your physician recommends that you continue on your current medications as directed. Please refer to the Current Medication list given to you today.  Labwork: BMET & Magnesium Level Non-[facility]ing [facility] ([place])  Testing/Procedures: none  Follow-Up: Your physician recommends that you schedule a follow-up appointment in: 1 month  Any Other Special Instructions Will Be Listed Below (If Applicable).  If you need a refill on your cardiac medications before your next appointment, please call your pharmacy.

## 2023-01-31 NOTE — Progress Notes (Signed)
Clinical Summary Ms. Bedrick is a 67 y.o.female seen today for follow up of the following medical problems.    1. Chronic systolic HF/ICM/CAD - patient admitted with acute onset CHF 08/2017.  - echo 08/2017 LVEF 30-35%, grade I diastoilc dysfunction - 09/2017 cath as reported below, found to have severe multivessel disease of LAD, LCX, and RCA. Mean PA 33, PCWP 25, CI 2.7 - seen by CT surgery, recs were for PCI as opposed to CABG. -  Received DES to LAD and DES x 2 to RCA, LCX disease managed medically. Recs for lifelong plavix per interventional cards. Has been on high dose ASA per neurology.  - discharge weight 09/13/17 312 lbs    08/2018 echo limited visually, LVEF 30-40% Jan 2020 echo: LVEF 35-40%, though difficult study even with contrast   05/2020 echo:LVEF 30%, normal RV function, mod pericardial effusion   01/2022 ER visit with chest pain - trop neg x 2 - CXR mild congestion - EKG SR chronic LBBB - burning pain midchest at rest. No other associated symptoms. Lasted a few seconds. Mild pain. Got up to walk to bathroom had some SOB.  - chest pain yesterday that went under arm, better with tums.     -SOB over the last few weeks. Comes on with low levels of exertion - has not some LE edema. No orthopnea - mixed compliance with meds, sometimes forgets. Misses about 1-2 times per week - upcoming March 16 - she reports pcp had stop jardiance, she is unsure.     03/2022 echo: limited visualziation, estimated LVEF 40-50% range  - last visit we increased lasix to 46m bid - pcp had stopped jardiance, requested records to see reason - wt 360 lbs -->354 lbs but our scale, similar to her scale.  - exertion limited by back pain. Swelling has improved.  - repeat labs showed stable Cr and K       - no SOB/DOE. Some swelling at times. Home weight 338 lbs trending down - compliant with meds.  - lasix increased to 670mdaily on 01/19/23, needs repeat bmet - HF meds lowered during  recent admission Jan 2024 with tamponade due to low bp's     2. PAD 05/2020 ABI: right 1.11 left 1.24, however limited distal evaluation. Suggested right posterior tibial occlusion and abnormal left distal waveforms - no recent symptoms.    3.Pericardial effusion - admit Jan 2024 with large effusion - unsuccesful pericardiocentesis, had pericardial window 12/13/22   4. 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 multivessel CAD by cath in 09/2017 and not felt to be a CABG candidate --> underwent two-vessel PCI with DES to the LAD and DES to the RCA   Diabetes mellitus    Diabetes mellitus, type II ([place]   Dyspnea    Glaucoma    Hypertension    Left bundle Aradhya Shellenbarger block    Morbid obesity ([place]   Obstructive sleep apnea    does not wear CPAP   Thyroid disease      Allergies  Allergen Reactions   Ampicillin Other (See Comments)    [facility] Aspirin Nausea Only   Hydrocodone Other (See Comments)    "Allergic," per  [facility]   Unasyn [Ampicillin-Sulbactam Sodium] Rash and Other (See Comments)    "Allergic," per [facility]     Current Outpatient Medications  Medication Sig Dispense Refill   acetaminophen (TYLENOL) 325 MG tablet Take 2 tablets (650 mg total) by mouth every 4 (four) hours as needed for mild pain, fever or headache. 12 tablet 0   atorvastatin (LIPITOR) 80 MG tablet TAKE 1 TABLET(80 MG) BY MOUTH DAILY 30 tablet 6   calcium carbonate (TUMS - DOSED IN MG ELEMENTAL CALCIUM) 500 MG chewable tablet Chew 4 tablets by mouth every evening.     cetirizine (ZYRTEC) 10 MG tablet Take 10 mg by mouth daily as needed for allergies.   0   clopidogrel (PLAVIX) 75 MG tablet TAKE 1 TABLET(75 MG) BY MOUTH DAILY 90 tablet 1   COMBIGAN 0.2-0.5 % ophthalmic solution Place 1 drop into both eyes  2 (two) times daily.     diclofenac Sodium (VOLTAREN) 1 % GEL Apply 2 g topically daily as needed (arthritis).     furosemide (LASIX) 40 MG tablet Take 1.5 tablets (60 mg total) by mouth daily. 30 tablet    glucose blood (ONETOUCH VERIO) test strip Test glucose 4 times day. 150 each 2   insulin aspart (NOVOLOG) 100 UNIT/ML injection Inject 0-15 Units into the skin 3 (three) times daily with meals.     JARDIANCE 25 MG TABS tablet Take 25 mg by mouth daily.     levothyroxine (SYNTHROID) 200 MCG tablet Take 200 mcg by mouth daily before breakfast. Take 1 tablet with 55mg for a total dose of 2537m daily     levothyroxine (SYNTHROID) 50 MCG tablet Take 50 mcg by mouth daily before breakfast. Take 1 tablet with 20042mfor a total dose of 250m30maily     metoprolol succinate (TOPROL-XL) 25 MG 24 hr tablet Take 1 tablet (25 mg total) by mouth daily.     sacubitril-valsartan (ENTRESTO) 24-26 MG Take 1 tablet by mouth 2 (two) times daily. 60 tablet 0   senna-docusate (SENOKOT-S) 8.6-50 MG tablet Take 2 tablets by mouth 2 (two) times daily. 60 tablet 3   spironolactone (ALDACTONE) 25 MG tablet Take 0.5 tablets (12.5 mg total) by mouth daily. 45 tablet 3   TRESIBA FLEXTOUCH 100 UNIT/ML FlexTouch Pen Inject 10 Units into the skin at bedtime.     VENTOLIN HFA 108 (90 Base) MCG/ACT inhaler Inhale 1-2 puffs into the lungs every 6 (six) hours as needed for wheezing or shortness of breath. 18 g 1   No current facility-administered medications for this visit.     Past Surgical History:  Procedure Laterality Date   ABDOMINAL HYSTERECTOMY     CARDIAC CATHETERIZATION  09/12/2017   CORONARY STENT INTERVENTION  09/12/2017   STENT RESOLUTE ONYX [id number] eluting stent was successfully placed   CORONARY STENT INTERVENTION N/A 09/12/2017   Procedure: CORONARY STENT INTERVENTION;  Surgeon: HardLeonie Man;  [facility];  Service: Cardiovascular;  Laterality: N/A;   INCISION AND DRAINAGE  PERIRECTAL ABSCESS Left 09/16/2019   Procedure: IRRIGATION AND DEBRIDEMENT LABIA ABSCESS;  Surgeon: BlacCoralie Keens;  [facility];  Laterality: Left;   IR FLUORO GUIDE CV LINE RIGHT  09/16/2019   IR US [place] VASC ACCESS RIGHT  09/16/2019   LEFT HEART CATH AND CORONARY ANGIOGRAPHY N/A 09/12/2017   Procedure: LEFT HEART CATH AND CORONARY ANGIOGRAPHY;  Surgeon: HardLeonie Man;  [facility];  Service: Cardiovascular;  Laterality: N/A;   MASS EXCISION N/A 12/18/2018   Procedure: EXCISION TONGUE MASS;  Surgeon: Leta Baptist, MD;  Location[facility];  Service: ENT;  Laterality: N/A;   PERICARDIOCENTESIS N/A 12/22/2022   Procedure: PERICARDIOCENTESIS;  Surgeon: [name], MD;  Location: [place] CV LAB;  Service: Cardiovascular;  Laterality: N/A;   RIGHT/LEFT HEART CATH AND CORONARY ANGIOGRAPHY N/A 09/10/2017   Procedure: RIGHT/LEFT HEART CATH AND CORONARY ANGIOGRAPHY;  Surgeon: Troy Sine, MD;  Location: [place] CV LAB;  Service: Cardiovascular;  Laterality: N/A;   SUBXYPHOID PERICARDIAL WINDOW N/A 12/23/2022   Procedure: SUBXYPHOID PERICARDIAL WINDOW;  Surgeon: Coralie Common, MD;  Location: [place];  Service: Thoracic;  Laterality: N/A;   TEE WITHOUT CARDIOVERSION N/A 12/23/2022   Procedure: TRANSESOPHAGEAL ECHOCARDIOGRAM (TEE);  Surgeon: Coralie Common, MD;  Location: [facility] OR;  Service: Thoracic;  Laterality: N/A;   THYROID SURGERY       Allergies  Allergen Reactions   Ampicillin Other (See Comments)    "Allergic," per [facility]   Aspirin Nausea Only   Hydrocodone Other (See Comments)    "Allergic," per MAR   Unasyn [Ampicillin-Sulbactam Sodium] Rash and Other (See Comments)    "Allergic," per [facility]      Family History  Problem Relation Age of Onset   Diabetes Mother    Hypertension Mother    Cancer Mother        pancreas   Hypertension Sister      Social History Ms. Karczewski reports that she quit smoking about 17 years ago. Her smoking  use included cigarettes. She has never used smokeless tobacco. Ms. Mancias reports no history of alcohol use.   Review of Systems CONSTITUTIONAL: No weight loss, fever, chills, weakness or fatigue.  HEENT: Eyes: No visual loss, blurred vision, double vision or yellow sclerae.No hearing loss, sneezing, congestion, runny nose or sore throat.  SKIN: No rash or itching.  CARDIOVASCULAR: per hpi RESPIRATORY: No shortness of breath, cough or sputum.  GASTROINTESTINAL: No anorexia, nausea, vomiting or diarrhea. No abdominal pain or blood.  GENITOURINARY: No burning on urination, no polyuria NEUROLOGICAL: No headache, dizziness, syncope, paralysis, ataxia, numbness or tingling in the extremities. No change in bowel or bladder control.  MUSCULOSKELETAL: No muscle, back pain, joint pain or stiffness.  LYMPHATICS: No enlarged nodes. No history of splenectomy.  PSYCHIATRIC: No history of depression or anxiety.  ENDOCRINOLOGIC: No reports of sweating, cold or heat intolerance. No polyuria or polydipsia.  Marland Kitchen   Physical Examination Today's Vitals   01/31/23 1116 01/31/23 1205  BP: (!) 140/82 130/80  Pulse: 78   SpO2: 97%   Weight: (!) 338 lb 9.6 oz (153.6 kg)   Height: 5' 5"$  (1.651 m)    Body mass index is 56.35 kg/m.  Gen: resting comfortably, no acute distress HEENT: no scleral icterus, pupils equal round and reactive, no palptable cervical adenopathy,  CV: RRR, no m/r,gno jvd Resp: Clear to auscultation bilaterally GI: abdomen is soft, non-tender, non-distended, normal bowel sounds, no hepatosplenomegaly MSK: extremities are warm, no edema.  Skin: warm, no rash Neuro:  no focal deficits Psych: appropriate affect   Diagnostic Studies  05/2020 echo IMPRESSIONS     1. Images are limited. Suggest Definity contrast when adequate IV access  available.   2. Left ventricular ejection fraction, by estimation, is approximately  30%. The left ventricle has severely decreased function.  Left ventricular  endocardial border not optimally defined to evaluate regional wall motion.  There is moderate left  ventricular  hypertrophy. Left ventricular diastolic parameters are  indeterminate.   3. Right ventricular systolic function is normal. The right ventricular  size is normal.   4. Moderate pericardial effusion. The pericardial effusion is  circumferential.   5. The mitral valve is grossly normal. Trivial mitral valve  regurgitation.   6. The aortic valve is tricuspid. Aortic valve regurgitation is not  visualized.   7. The inferior vena cava is normal in size with greater than 50%  respiratory variability, suggesting right atrial pressure of 3 mmHg.     03/2022 echo 1. Endocardium poorly visualized, very difficult LVEF assessment. Grossly  very limited estimate LVEF appears 40-50% range. Recommend limited echo  with contrast to better clarify function. . Left ventricular ejection  fraction, by estimation, is 40-50%.   Jan 2024 echo IMPRESSIONS     1. Large pericardial effusion up to 4.8 cm anterior to RV (subcostal) and  2.8 cm posterior to LV. The RV appears small and underfilled. There is  systolic collapse of the RA. The IVC is dilated. Overall, findings are  suspicious for cardiac tamponade, but   clinical correlation is recommended. Large pericardial effusion. The  pericardial effusion is posterior and lateral to the left ventricle and  anterior to the right ventricle.   2. Left ventricular ejection fraction, by estimation, is 30 to 35%. The  left ventricle has moderately decreased function. Left ventricular  endocardial border not optimally defined to evaluate regional wall motion.   3. Right ventricular systolic function is normal. The right ventricular  size is normal.   4. The inferior vena cava is dilated in size with >50% respiratory  variability, suggesting right atrial pressure of 8 mmHg.     Assessment and Plan  Chronic systolic HF -appears  euvolemic today, we will repeat bmet/mg with recent icnrease of her lasix in HF clinic 2 weeks ago - HF meds decreased during recent admission with low bp's and tamponade, were also low at 01/17/23 f/u. Today up to 130/80, monitor at this time, may titrate HF meds if bp's remains stable.    2. CAD No symptoms, continue current meds  3. Pericardial effusion - recent pericardial window, doing well without symptoms - likely effusion secondary to hypothyroidism    F/u 1 month, pending bp consider titrating HF meds   Arnoldo Lenis, M.D

## 2023-02-07 ENCOUNTER — Other Ambulatory Visit: Payer: Self-pay | Admitting: "Endocrinology

## 2023-02-07 DIAGNOSIS — E1159 Type 2 diabetes mellitus with other circulatory complications: Secondary | ICD-10-CM

## 2023-02-08 DIAGNOSIS — I10 Essential (primary) hypertension: Secondary | ICD-10-CM | POA: Diagnosis not present

## 2023-02-08 DIAGNOSIS — M1710 Unilateral primary osteoarthritis, unspecified knee: Secondary | ICD-10-CM | POA: Diagnosis not present

## 2023-02-08 DIAGNOSIS — I5022 Chronic systolic (congestive) heart failure: Secondary | ICD-10-CM | POA: Diagnosis not present

## 2023-02-08 DIAGNOSIS — E038 Other specified hypothyroidism: Secondary | ICD-10-CM | POA: Diagnosis not present

## 2023-02-08 DIAGNOSIS — Z0000 Encounter for general adult medical examination without abnormal findings: Secondary | ICD-10-CM | POA: Diagnosis not present

## 2023-02-08 DIAGNOSIS — E1121 Type 2 diabetes mellitus with diabetic nephropathy: Secondary | ICD-10-CM | POA: Diagnosis not present

## 2023-02-08 DIAGNOSIS — J4520 Mild intermittent asthma, uncomplicated: Secondary | ICD-10-CM | POA: Diagnosis not present

## 2023-02-09 LAB — ACID FAST CULTURE WITH REFLEXED SENSITIVITIES (MYCOBACTERIA): Acid Fast Culture: NEGATIVE

## 2023-02-21 DIAGNOSIS — Z79899 Other long term (current) drug therapy: Secondary | ICD-10-CM | POA: Diagnosis not present

## 2023-02-21 DIAGNOSIS — I5022 Chronic systolic (congestive) heart failure: Secondary | ICD-10-CM | POA: Diagnosis not present

## 2023-02-22 DIAGNOSIS — H401133 Primary open-angle glaucoma, bilateral, severe stage: Secondary | ICD-10-CM | POA: Diagnosis not present

## 2023-02-22 LAB — BASIC METABOLIC PANEL
BUN/Creatinine Ratio: 16 (ref 12–28)
BUN: 19 mg/dL (ref 8–27)
CO2: 21 mmol/L (ref 20–29)
Calcium: 8 mg/dL — ABNORMAL LOW (ref 8.7–10.3)
Chloride: 108 mmol/L — ABNORMAL HIGH (ref 96–106)
Creatinine, Ser: 1.2 mg/dL — ABNORMAL HIGH (ref 0.57–1.00)
Glucose: 161 mg/dL — ABNORMAL HIGH (ref 70–99)
Potassium: 4.4 mmol/L (ref 3.5–5.2)
Sodium: 144 mmol/L (ref 134–144)
eGFR: 51 mL/min/{1.73_m2} — ABNORMAL LOW (ref >59)

## 2023-02-22 LAB — MAGNESIUM: Magnesium: 2.1 mg/dL (ref 1.6–2.3)

## 2023-03-01 ENCOUNTER — Encounter: Payer: Self-pay | Admitting: Nurse Practitioner

## 2023-03-01 ENCOUNTER — Ambulatory Visit: Payer: 59 | Attending: Nurse Practitioner | Admitting: Nurse Practitioner

## 2023-03-01 VITALS — BP 120/88 | HR 77 | Ht 65.0 in | Wt 342.6 lb

## 2023-03-01 DIAGNOSIS — Z79899 Other long term (current) drug therapy: Secondary | ICD-10-CM | POA: Diagnosis not present

## 2023-03-01 DIAGNOSIS — I3139 Other pericardial effusion (noninflammatory): Secondary | ICD-10-CM | POA: Diagnosis not present

## 2023-03-01 DIAGNOSIS — I10 Essential (primary) hypertension: Secondary | ICD-10-CM | POA: Diagnosis not present

## 2023-03-01 DIAGNOSIS — I2510 Atherosclerotic heart disease of native coronary artery without angina pectoris: Secondary | ICD-10-CM | POA: Diagnosis not present

## 2023-03-01 DIAGNOSIS — I255 Ischemic cardiomyopathy: Secondary | ICD-10-CM

## 2023-03-01 DIAGNOSIS — I5022 Chronic systolic (congestive) heart failure: Secondary | ICD-10-CM | POA: Diagnosis not present

## 2023-03-01 MED ORDER — SACUBITRIL-VALSARTAN 24-26 MG PO TABS: 1.0000 | ORAL_TABLET | Freq: Two times a day (BID) | ORAL | 1 refills | Status: DC

## 2023-03-01 MED ORDER — SPIRONOLACTONE 25 MG PO TABS: 25.0000 mg | ORAL_TABLET | Freq: Every day | ORAL | 1 refills | Status: DC

## 2023-03-01 MED ORDER — METOPROLOL SUCCINATE ER 25 MG PO TB24: 25.0000 mg | ORAL_TABLET | Freq: Every day | ORAL | 1 refills | Status: DC

## 2023-03-01 NOTE — Progress Notes (Signed)
Office Visit    Patient Name: Maureen Jackson Date of Encounter: 03/01/2023  PCP:  Neale Burly, MD   [place]  Cardiologist:  Carlyle Dolly, MD Advanced Practice Provider:  Finis Bud, NP Electrophysiologist:  None  6}  Chief Complaint    Maureen Jackson is a 67 y.o. female with a hx of CAD, ischemic cardiomyopathy/chronic systolic CHF, pericardial effusion, s/p pericardial window 12/2022, PAD, hypertension, type 2 diabetes, OSA (does not wear CPAP), thyroid disease, left bundle branch block, and morbid obesity, who presents today for 1 month follow-up.   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 multivessel CAD by cath in 09/2017 and not felt to be a CABG candidate --> underwent two-vessel PCI with DES to the LAD and DES to the RCA   Diabetes mellitus    Diabetes mellitus, type II ([place])    Dyspnea    Glaucoma    Hypertension    Left bundle branch block    Morbid obesity ([place])    Obstructive sleep apnea    does not wear CPAP   Thyroid disease    Past Surgical History:  Procedure Laterality Date   ABDOMINAL HYSTERECTOMY     CARDIAC CATHETERIZATION  09/12/2017   CORONARY STENT INTERVENTION  09/12/2017   STENT RESOLUTE ONYX [id number] eluting stent was successfully placed   CORONARY STENT INTERVENTION N/A 09/12/2017   Procedure: CORONARY STENT INTERVENTION;  Surgeon: Leonie Man, MD;  Location: [place] CV LAB;  Service: Cardiovascular;  Laterality: N/A;   INCISION AND DRAINAGE PERIRECTAL ABSCESS Left 09/16/2019   Procedure: IRRIGATION AND DEBRIDEMENT LABIA ABSCESS;  Surgeon: Coralie Keens, MD;  Location: [place];  Service: General;  Laterality: Left;   IR FLUORO GUIDE CV LINE RIGHT  09/16/2019   IR US GUIDE VASC ACCESS  RIGHT  09/16/2019   LEFT HEART CATH AND CORONARY ANGIOGRAPHY N/A 09/12/2017   Procedure: LEFT HEART CATH AND CORONARY ANGIOGRAPHY;  Surgeon: Leonie Man, MD;  Location: [place] CV LAB;  Service: Cardiovascular;  Laterality: N/A;   MASS EXCISION N/A 12/18/2018   Procedure: EXCISION TONGUE MASS;  Surgeon: Leta Baptist, MD;  Location: [facility] OR;  Service: ENT;  Laterality: N/A;   PERICARDIOCENTESIS N/A 12/22/2022   Procedure: PERICARDIOCENTESIS;  Surgeon: [name], MD;  Location: [place] CV LAB;  Service: Cardiovascular;  Laterality: N/A;   RIGHT/LEFT HEART CATH AND CORONARY ANGIOGRAPHY N/A 09/10/2017   Procedure: RIGHT/LEFT HEART CATH AND CORONARY ANGIOGRAPHY;  Surgeon: Troy Sine, MD;  Location: [place] CV LAB;  Service: Cardiovascular;  Laterality: N/A;   SUBXYPHOID PERICARDIAL WINDOW N/A 12/23/2022   Procedure: SUBXYPHOID PERICARDIAL WINDOW;  Surgeon: Coralie Common, MD;  Location: [place];  Service: Thoracic;  Laterality: N/A;   TEE WITHOUT CARDIOVERSION N/A 12/23/2022   Procedure: TRANSESOPHAGEAL ECHOCARDIOGRAM (TEE);  Surgeon: Coralie Common, MD;  Location[facility] OR;  Service: Thoracic;  Laterality: N/A;   THYROID SURGERY      Allergies  Allergies  Allergen Reactions   Ampicillin Other (See Comments)    "Allergic," per [facility]   Aspirin Nausea Only   Hydrocodone Other (See Comments)    "Allergic," per [facility]   Unasyn [Ampicillin-Sulbactam Sodium] Rash  and Other (See Comments)    "Allergic," per [facility]    History of Present Illness    Maureen Jackson is a 67 y.o. female with a PMH as mentioned above.  Admitted with acute onset CHF in 2018.  Echocardiogram revealed EF 30 to 35%, grade 1 DD.  Cath report noted below, noted to have severe multivessel CAD.  Evaluated by CT surgery, Rex for PCI as opposed to CABG and received DES to LAD and DES x 2 to RCA, left circumflex disease medically managed.  In 2021 echo revealed EF 30%, moderate pericardial effusion.  In April 2023, estimated EF  40 to 50%.   Admitted in January 2024 with a large pericardial effusion, had unsuccessful pericardiocentesis and had pericardial window performed on December 13, 2022.  HF meds decreased due to tamponade/low BPs.  Has been closely followed in heart failure clinic.  Last seen by Dr. Carlyle Dolly on January 31, 2023.  Was overall euvolemic on exam.  Repeat labs were obtained due to increase in Lasix.  Dr. Harl Bowie recommended to monitor at this time, recommended titrating HF meds at future follow-up if BPs remain stable.  Was felt that pericardial effusion was likely secondary due to hypothyroidism.  Today she presents for 1 month follow-up.  She states her weight has been stable. Denies any chest pain or shortness of breath. Admits to dependent edema, but overall stable. Denies any palpitations, syncope, presyncope, dizziness, orthopnea, PND, significant weight changes, acute bleeding, or claudication.  EKGs/Labs/Other Studies Reviewed:   The following studies were reviewed today:  EKG:  EKG is not ordered today.    TTE limited 12/2022:   1. Large pericardial effusion up to 4.8 cm anterior to RV (subcostal) and  2.8 cm posterior to LV. The RV appears small and underfilled. There is  systolic collapse of the RA. The IVC is dilated. Overall, findings are  suspicious for cardiac tamponade, but   clinical correlation is recommended. Large pericardial effusion. The  pericardial effusion is posterior and lateral to the left ventricle and  anterior to the right ventricle.   2. Left ventricular ejection fraction, by estimation, is 30 to 35%. The  left ventricle has moderately decreased function. Left ventricular  endocardial border not optimally defined to evaluate regional wall motion.   3. Right ventricular systolic function is normal. The right ventricular  size is normal.   4. The inferior vena cava is dilated in size with >50% respiratory  variability, suggesting right atrial pressure of 8  mmHg.  Recent Labs: 01/01/2023: Hemoglobin 9.8; Platelets 204 01/17/2023: ALT 28; B Natriuretic Peptide 142.5; TSH 26.048 02/21/2023: BUN 19; Creatinine, Ser 1.20; Magnesium 2.1; Potassium 4.4; Sodium 144  Recent Lipid Panel    Component Value Date/Time   CHOL 147 07/17/2018 0152   TRIG 124 12/26/2019 1546   HDL 31 (L) 07/17/2018 0152   CHOLHDL 4.7 07/17/2018 0152   VLDL 33 07/17/2018 0152   LDLCALC 83 07/17/2018 0152    Home Medications   Current Meds  Medication Sig   acetaminophen (TYLENOL) 325 MG tablet Take 2 tablets (650 mg total) by mouth every 4 (four) hours as needed for mild pain, fever or headache.   atorvastatin (LIPITOR) 80 MG tablet TAKE 1 TABLET(80 MG) BY MOUTH DAILY   calcium carbonate (TUMS - DOSED IN MG ELEMENTAL CALCIUM) 500 MG chewable tablet Chew 4 tablets by mouth every evening.   cetirizine (ZYRTEC) 10 MG tablet Take 10 mg by mouth daily as needed for allergies.  clopidogrel (PLAVIX) 75 MG tablet TAKE 1 TABLET(75 MG) BY MOUTH DAILY   COMBIGAN 0.2-0.5 % ophthalmic solution Place 1 drop into both eyes 2 (two) times daily.   diclofenac Sodium (VOLTAREN) 1 % GEL Apply 2 g topically daily as needed (arthritis).   furosemide (LASIX) 40 MG tablet Take 1.5 tablets (60 mg total) by mouth daily.   glucose blood (ONETOUCH VERIO) test strip Test glucose 4 times day.   insulin aspart (NOVOLOG) 100 UNIT/ML injection Inject 0-15 Units into the skin 3 (three) times daily with meals.   JARDIANCE 25 MG TABS tablet Take 25 mg by mouth daily.   levothyroxine (SYNTHROID) 200 MCG tablet Take 200 mcg by mouth daily before breakfast. Take 1 tablet with 33mcg for a total dose of 266mcg daily   levothyroxine (SYNTHROID) 50 MCG tablet Take 50 mcg by mouth daily before breakfast. Take 1 tablet with 250mcg for a total dose of 266mcg daily   senna-docusate (SENOKOT-S) 8.6-50 MG tablet Take 2 tablets by mouth 2 (two) times daily.   TRESIBA FLEXTOUCH 100 UNIT/ML FlexTouch Pen Inject 10 Units  into the skin at bedtime.   VENTOLIN HFA 108 (90 Base) MCG/ACT inhaler Inhale 1-2 puffs into the lungs every 6 (six) hours as needed for wheezing or shortness of breath.    metoprolol succinate (TOPROL-XL) 25 MG 24 hr tablet Take 1 tablet (25 mg total) by mouth daily.   sacubitril-valsartan (ENTRESTO) 24-26 MG Take 1 tablet by mouth 2 (two) times daily.   spironolactone (ALDACTONE) 25 MG tablet Take 0.5 tablets (12.5 mg total) by mouth daily.     Review of Systems    All other systems reviewed and are otherwise negative except as noted above.  Physical Exam    VS:  BP 120/88 (BP Location: Left Arm, Patient Position: Sitting, Cuff Size: Large)   Pulse 77   Ht 5\' 5"  (1.651 m)   Wt (!) 342 lb 9.6 oz (155.4 kg)   SpO2 96%   BMI 57.01 kg/m  , BMI Body mass index is 57.01 kg/m.  Wt Readings from Last 3 Encounters:  03/01/23 (!) 342 lb 9.6 oz (155.4 kg)  01/31/23 (!) 338 lb 9.6 oz (153.6 kg)  01/24/23 (!) 340 lb (154.2 kg)     GEN: Morbidly obese, 67 y.o. female in no acute distress. HEENT: normal. Neck: Supple, no JVD, carotid bruits, or masses. Cardiac: S1/S2, RRR, no murmurs, rubs, or gallops. No clubbing, cyanosis. Nonpitting, dependent edema along BLE.  Radials/PT 2+ and equal bilaterally.  Respiratory:  Respirations regular and unlabored, clear to auscultation bilaterally. MS: No deformity or atrophy. Skin: Warm and dry, no rash. Neuro:  Strength and sensation are intact. Psych: Normal affect.  Assessment & Plan   Chronic systolic CHF, ICM, medication management TTE 12/2022 revealed EF 30-35%. Wt is up 4 lbs from last visit. Does not appear volume overloaded, overall euvolemic and well compensated on exam. Will increase Aldactone to 25 mg daily and continue Lasix, Jardiance, Toprol-XL, and Entresto. Low sodium diet, fluid restriction <2L, and daily weights encouraged. Educated to contact our office for weight gain of 2 lbs overnight or 5 lbs in one week.  Will repeat BMET and  mag in 2 weeks per protocol.  Will update limited echo in 1 month as mentioned below. Will refill HF meds per her request.  HTN Blood pressure stable.  Continue current medication regimen.  Increasing Aldactone as mentioned above.  Will repeat BMET and mag level in 2 weeks per protocol.  Discussed to monitor BP at home at least 2 hours after medications and sitting for 5-10 minutes. Heart healthy diet and regular cardiovascular exercise encouraged.   Pericardial effusion Status post pericardial window.  Doing well.  Will repeat limited echo in 1 month to reevaluate for pericardial effusion.  CAD Stable with no anginal symptoms. No indication for ischemic evaluation.  Continue Lipitor, Plavix, and Toprol-XL. Heart healthy diet and regular cardiovascular exercise encouraged.    Disposition: Follow up in 2-3 month(s) with Carlyle Dolly, MD or APP.  Signed, Finis Bud, NP 03/01/2023, 5:03 PM [place]

## 2023-03-01 NOTE — Patient Instructions (Addendum)
Medication Instructions:  Your physician has recommended you make the following change in your medication:  INCREASE spironolactone to 25 mg (1 tablet) by mouth daily Continue all other medications as directed  Labwork: Labs due in 2 weeks at [facility]  Testing/Procedures: Your physician has requested that you have an echocardiogram. Echocardiography is a painless test that uses sound waves to create images of your heart. It provides your doctor with information about the size and shape of your heart and how well your heart's chambers and valves are working. This procedure takes approximately one hour. There are no restrictions for this procedure. Please do NOT wear cologne, perfume, aftershave, or lotions (deodorant is allowed). Please arrive 15 minutes prior to your appointment time.   Follow-Up:  Your physician recommends that you schedule a follow-up appointment in: 2-3 months  Any Other Special Instructions Will Be Listed Below (If Applicable).  If you need a refill on your cardiac medications before your next appointment, please call your pharmacy.

## 2023-03-07 ENCOUNTER — Telehealth: Payer: Self-pay | Admitting: Cardiology

## 2023-03-07 MED ORDER — SACUBITRIL-VALSARTAN 24-26 MG PO TABS: 1.0000 | ORAL_TABLET | Freq: Two times a day (BID) | ORAL | 1 refills | Status: DC

## 2023-03-07 NOTE — Telephone Encounter (Signed)
*  STAT* If patient is at the pharmacy, call can be transferred to refill team.   1. Which medications need to be refilled? (please list name of each medication and dose if known)  sacubitril-valsartan (ENTRESTO) 24-26 MG   2. Which pharmacy/location (including street and city if local pharmacy) is medication to be sent to?  [id number] - [place], [address]    3. Do they need a 30 day or 90 day supply? 90 day   Only has two tablets left

## 2023-03-09 ENCOUNTER — Emergency Department (HOSPITAL_COMMUNITY): Payer: 59

## 2023-03-09 ENCOUNTER — Other Ambulatory Visit: Payer: Self-pay

## 2023-03-09 ENCOUNTER — Emergency Department (HOSPITAL_COMMUNITY)
Admission: EM | Admit: 2023-03-09 | Discharge: 2023-03-09 | Disposition: A | Discharge status: Home / Self Care | Payer: 59 | Attending: Emergency Medicine | Admitting: Emergency Medicine

## 2023-03-09 DIAGNOSIS — E119 Type 2 diabetes mellitus without complications: Secondary | ICD-10-CM | POA: Insufficient documentation

## 2023-03-09 DIAGNOSIS — Z743 Need for continuous supervision: Secondary | ICD-10-CM | POA: Diagnosis not present

## 2023-03-09 DIAGNOSIS — M25562 Pain in left knee: Secondary | ICD-10-CM | POA: Diagnosis not present

## 2023-03-09 DIAGNOSIS — S8992XA Unspecified injury of left lower leg, initial encounter: Secondary | ICD-10-CM | POA: Diagnosis present

## 2023-03-09 DIAGNOSIS — W19XXXA Unspecified fall, initial encounter: Secondary | ICD-10-CM | POA: Diagnosis not present

## 2023-03-09 DIAGNOSIS — I110 Hypertensive heart disease with heart failure: Secondary | ICD-10-CM | POA: Insufficient documentation

## 2023-03-09 DIAGNOSIS — S8392XA Sprain of unspecified site of left knee, initial encounter: Secondary | ICD-10-CM | POA: Insufficient documentation

## 2023-03-09 DIAGNOSIS — M25569 Pain in unspecified knee: Secondary | ICD-10-CM | POA: Diagnosis not present

## 2023-03-09 DIAGNOSIS — Z794 Long term (current) use of insulin: Secondary | ICD-10-CM | POA: Diagnosis not present

## 2023-03-09 DIAGNOSIS — Z79899 Other long term (current) drug therapy: Secondary | ICD-10-CM | POA: Insufficient documentation

## 2023-03-09 DIAGNOSIS — I509 Heart failure, unspecified: Secondary | ICD-10-CM | POA: Insufficient documentation

## 2023-03-09 DIAGNOSIS — R6889 Other general symptoms and signs: Secondary | ICD-10-CM | POA: Diagnosis not present

## 2023-03-09 MED ORDER — ACETAMINOPHEN 500 MG PO TABS
1000.0000 mg | ORAL_TABLET | Freq: Once | ORAL | Status: AC
  Administered 2023-03-09: 1000 mg via ORAL
  Filled 2023-03-09: qty 2

## 2023-03-09 MED ORDER — FENTANYL CITRATE PF 50 MCG/ML IJ SOSY
50.0000 ug | PREFILLED_SYRINGE | Freq: Once | INTRAMUSCULAR | Status: DC
  Filled 2023-03-09: qty 1

## 2023-03-09 NOTE — ED Triage Notes (Signed)
Patient BIB EMS for reported fall. Patient states she was walking when her left knee "Gave out". +abrasion, denies head injury or dizziness prior to fall.

## 2023-03-09 NOTE — Discharge Instructions (Signed)
You are seen to the emergency department due to fall.  The x-ray of your knee shows significant osteoarthritis but there is no fractures or obvious dislocations.  I suspect there is a sprain to one of the ligaments in the knee, wear the knee immobilizer when ambulating, you can take it off when sleeping.  Ice, elevate, schedule an appointment with orthopedic surgery for evaluation 1 week.  I put in a consult for home health to come evaluate your situation and needs for physical therapy and ambulation devices.  I also recommend calling your primary this week, and the know you were seen in the ED to make sure you are getting follow-up care.  Return to the ED for new or concerning symptoms.

## 2023-03-09 NOTE — ED Provider Notes (Cosign Needed Addendum)
[facility] Provider Note   CSN: [id number] Arrival date & time: 03/09/23  1242     History  Chief Complaint  Patient presents with   Lytle Michaels    Maureen Jackson is a 67 y.o. female.  The history is provided by the patient and a caregiver.  Fall     Patient with medical history of diabetes, CHF, hypertension, hyperlipidemia presents today due to fall.  Patient normally ambulates with a walker or cane, today she was ambulating without assistance and she felt like her left knee twisted and gave out on her.  She landed on the left knee, did not hit her head or lose consciousness.  Witnessed by aide.  She is not on any blood thinners.  Denies any prodromal symptoms, pain elsewhere other than the left knee.  No paresthesias.  Home Medications Prior to Admission medications   Medication Sig Start Date End Date Taking? Authorizing Provider  acetaminophen (TYLENOL) 325 MG tablet Take 2 tablets (650 mg total) by mouth every 4 (four) hours as needed for mild pain, fever or headache. 05/25/20   Denton Brick, Courage, MD  atorvastatin (LIPITOR) 80 MG tablet TAKE 1 TABLET(80 MG) BY MOUTH DAILY 05/09/22   Arnoldo Lenis, MD  calcium carbonate (TUMS - DOSED IN MG ELEMENTAL CALCIUM) 500 MG chewable tablet Chew 4 tablets by mouth every evening.    [provider]  cetirizine (ZYRTEC) 10 MG tablet Take 10 mg by mouth daily as needed for allergies.  09/12/18   [provider]  clopidogrel (PLAVIX) 75 MG tablet TAKE 1 TABLET(75 MG) BY MOUTH DAILY 03/27/22   Arnoldo Lenis, MD  COMBIGAN 0.2-0.5 % ophthalmic solution Place 1 drop into both eyes 2 (two) times daily. 02/08/22   [provider]  diclofenac Sodium (VOLTAREN) 1 % GEL Apply 2 g topically daily as needed (arthritis).    [provider]  furosemide (LASIX) 40 MG tablet Take 1.5 tablets (60 mg total) by mouth daily. 01/23/23   Lyda Jester M, PA-C  glucose blood  (ONETOUCH VERIO) test strip Test glucose 4 times day. 04/19/22   Cassandria Anger, MD  insulin aspart (NOVOLOG) 100 UNIT/ML injection Inject 0-15 Units into the skin 3 (three) times daily with meals. 01/02/23   Patrecia Pour, MD  JARDIANCE 25 MG TABS tablet Take 25 mg by mouth daily. 08/02/22   [provider]  levothyroxine (SYNTHROID) 200 MCG tablet Take 200 mcg by mouth daily before breakfast. Take 1 tablet with 35mcg for a total dose of 287mcg daily    [provider]  levothyroxine (SYNTHROID) 50 MCG tablet Take 50 mcg by mouth daily before breakfast. Take 1 tablet with 270mcg for a total dose of 29mcg daily    [provider]  metoprolol succinate (TOPROL-XL) 25 MG 24 hr tablet Take 1 tablet (25 mg total) by mouth daily. 03/01/23   Finis Bud, NP  sacubitril-valsartan (ENTRESTO) 24-26 MG Take 1 tablet by mouth 2 (two) times daily. 03/07/23   Arnoldo Lenis, MD  senna-docusate (SENOKOT-S) 8.6-50 MG tablet Take 2 tablets by mouth 2 (two) times daily. 05/25/20   Roxan Hockey, MD  spironolactone (ALDACTONE) 25 MG tablet Take 1 tablet (25 mg total) by mouth daily. 03/01/23   Finis Bud, NP  TRESIBA FLEXTOUCH 100 UNIT/ML FlexTouch Pen Inject 10 Units into the skin at bedtime. 01/02/23   Patrecia Pour, MD  VENTOLIN HFA 108 [phone] Base) MCG/ACT inhaler Inhale 1-2  puffs into the lungs every 6 (six) hours as needed for wheezing or shortness of breath. 05/25/20   Roxan Hockey, MD      Allergies    Ampicillin, Aspirin, Hydrocodone, and Unasyn [ampicillin-sulbactam sodium]    Review of Systems   Review of Systems  Physical Exam Updated Vital Signs BP (!) 159/82 (BP Location: Left Arm)   Pulse 78   Temp (!) 97.5 F (36.4 C) (Oral)   Resp 18   Ht 5\' 5"  (1.651 m)   Wt (!) 152 kg   SpO2 97%   BMI 55.75 kg/m  Physical Exam Vitals and nursing note reviewed.  Constitutional:      General: She is not in acute distress.    Appearance: She is  well-developed.  HENT:     Head: Normocephalic and atraumatic.  Eyes:     Conjunctiva/sclera: Conjunctivae normal.  Cardiovascular:     Rate and Rhythm: Normal rate and regular rhythm.     Pulses: Normal pulses.  Pulmonary:     Effort: Pulmonary effort is normal. No respiratory distress.     Breath sounds: Normal breath sounds.  Abdominal:     Palpations: Abdomen is soft.     Tenderness: There is no abdominal tenderness.  Musculoskeletal:        General: Tenderness present. No swelling.     Cervical back: Neck supple.     Comments: Able to move toes, flex and extend at the ankle, able to raise left hip.  Significant pain over the left knee  Skin:    General: Skin is warm and dry.     Capillary Refill: Capillary refill takes less than 2 seconds.     Comments: Abrasion over left patella  Neurological:     Mental Status: She is alert.  Psychiatric:        Mood and Affect: Mood normal.     ED Results / Procedures / Treatments   Labs (all labs ordered are listed, but only abnormal results are displayed) Labs Reviewed - No data to display  EKG None  Radiology No results found.  Procedures Procedures    Medications Ordered in ED Medications - No data to display  ED Course/ Medical Decision Making/ A&P                             Medical Decision Making Amount and/or Complexity of Data Reviewed Radiology: ordered.  Risk OTC drugs. Prescription drug management.   Patient presents the emergency department due to fall/left knee pain.  [place] includes fracture, dislocation, sprain, myalgia.  On exam she is neurovascular intact with brisk cap refill, DP and PT are 2+ and palpable.  She has significant tenderness with any range of the knee but is able to tolerate passive range reluctantly.  No other signs of injury or trauma, no obvious deformity.  Will proceed with plain films and reevaluate.  X-rays notable for effusion and significant osteoarthritis, no obvious  fracture or dislocation.  I agree with radiologist.  I ordered Tylenol for pain, will also replace walker and provide Tylenol she declined fentanyl or stronger medicine.  I put in an order for home health assessment in the outpatient, she seems to be having ambulation issues and having issues getting PT follow-up.  I will provide referral for orthopedic surgery follow-up if no improvement given the severity of the osteoarthritis.  At this time I do feel patient stable for discharge.  Plan was  discussed with the patient's carried and patient        Final Clinical Impression(s) / ED Diagnoses Final diagnoses:  None    Rx / DC Orders ED Discharge Orders     None         Sherrill Raring, PA-C 03/09/23 2200    Sherrill Raring, PA-C 03/09/23 2200    Milton Ferguson, MD 03/10/23 1044

## 2023-03-14 ENCOUNTER — Telehealth: Payer: Self-pay

## 2023-03-14 NOTE — Telephone Encounter (Signed)
     Patient  visit on 3/29  at [facility] you been able to follow up with your primary care physician? Yes   The patient was or was not able to obtain any needed medicine or equipment. Yes   Are there diet recommendations that you are having difficulty following? Na   Patient expresses understanding of discharge instructions and education provided has no other needs at this time. Yes       [facility] [phone] [address], [place], [place] [address] Phone: [phone] Email: [email or web address]

## 2023-03-16 DIAGNOSIS — M1710 Unilateral primary osteoarthritis, unspecified knee: Secondary | ICD-10-CM | POA: Diagnosis not present

## 2023-03-16 DIAGNOSIS — R262 Difficulty in walking, not elsewhere classified: Secondary | ICD-10-CM | POA: Diagnosis not present

## 2023-03-21 ENCOUNTER — Ambulatory Visit: Payer: 59 | Admitting: Orthopedic Surgery

## 2023-03-21 ENCOUNTER — Telehealth: Payer: Self-pay | Admitting: Cardiology

## 2023-03-21 NOTE — Telephone Encounter (Signed)
Patient stated she injured herself in a fall and rescheduled her Echo test to 5/7.  Note - Patient's PA's expiration date shows 5/6.

## 2023-03-27 ENCOUNTER — Ambulatory Visit: Payer: 59 | Admitting: Orthopedic Surgery

## 2023-03-28 ENCOUNTER — Other Ambulatory Visit: Payer: 59

## 2023-04-12 DIAGNOSIS — M5459 Other low back pain: Secondary | ICD-10-CM | POA: Diagnosis not present

## 2023-04-17 ENCOUNTER — Other Ambulatory Visit: Payer: 59

## 2023-04-23 ENCOUNTER — Other Ambulatory Visit: Payer: Self-pay | Admitting: Cardiology

## 2023-05-01 ENCOUNTER — Ambulatory Visit: Payer: 59 | Admitting: Nurse Practitioner

## 2023-05-09 ENCOUNTER — Encounter: Payer: Self-pay | Admitting: Cardiology

## 2023-05-09 NOTE — Telephone Encounter (Signed)
Error

## 2023-05-10 ENCOUNTER — Ambulatory Visit (HOSPITAL_COMMUNITY): Payer: 59

## 2023-05-12 ENCOUNTER — Emergency Department (HOSPITAL_COMMUNITY)
Admission: EM | Admit: 2023-05-12 | Discharge: 2023-05-12 | Disposition: A | Discharge status: Home / Self Care | Payer: 59 | Attending: Student | Admitting: Student

## 2023-05-12 ENCOUNTER — Other Ambulatory Visit: Payer: Self-pay

## 2023-05-12 ENCOUNTER — Emergency Department (HOSPITAL_COMMUNITY): Payer: 59

## 2023-05-12 DIAGNOSIS — E039 Hypothyroidism, unspecified: Secondary | ICD-10-CM | POA: Diagnosis not present

## 2023-05-12 DIAGNOSIS — Z743 Need for continuous supervision: Secondary | ICD-10-CM | POA: Diagnosis not present

## 2023-05-12 DIAGNOSIS — Z7902 Long term (current) use of antithrombotics/antiplatelets: Secondary | ICD-10-CM | POA: Insufficient documentation

## 2023-05-12 DIAGNOSIS — Z7984 Long term (current) use of oral hypoglycemic drugs: Secondary | ICD-10-CM | POA: Insufficient documentation

## 2023-05-12 DIAGNOSIS — R0789 Other chest pain: Secondary | ICD-10-CM | POA: Diagnosis not present

## 2023-05-12 DIAGNOSIS — R079 Chest pain, unspecified: Secondary | ICD-10-CM | POA: Diagnosis not present

## 2023-05-12 DIAGNOSIS — R110 Nausea: Secondary | ICD-10-CM | POA: Diagnosis not present

## 2023-05-12 DIAGNOSIS — I5043 Acute on chronic combined systolic (congestive) and diastolic (congestive) heart failure: Secondary | ICD-10-CM | POA: Diagnosis not present

## 2023-05-12 DIAGNOSIS — Z79899 Other long term (current) drug therapy: Secondary | ICD-10-CM | POA: Diagnosis not present

## 2023-05-12 DIAGNOSIS — R5381 Other malaise: Secondary | ICD-10-CM | POA: Diagnosis not present

## 2023-05-12 DIAGNOSIS — N183 Chronic kidney disease, stage 3 unspecified: Secondary | ICD-10-CM | POA: Diagnosis not present

## 2023-05-12 DIAGNOSIS — I2510 Atherosclerotic heart disease of native coronary artery without angina pectoris: Secondary | ICD-10-CM | POA: Diagnosis not present

## 2023-05-12 DIAGNOSIS — I447 Left bundle-branch block, unspecified: Secondary | ICD-10-CM | POA: Diagnosis not present

## 2023-05-12 DIAGNOSIS — Z794 Long term (current) use of insulin: Secondary | ICD-10-CM | POA: Diagnosis not present

## 2023-05-12 DIAGNOSIS — I130 Hypertensive heart and chronic kidney disease with heart failure and stage 1 through stage 4 chronic kidney disease, or unspecified chronic kidney disease: Secondary | ICD-10-CM | POA: Insufficient documentation

## 2023-05-12 DIAGNOSIS — Z87891 Personal history of nicotine dependence: Secondary | ICD-10-CM | POA: Diagnosis not present

## 2023-05-12 DIAGNOSIS — E1122 Type 2 diabetes mellitus with diabetic chronic kidney disease: Secondary | ICD-10-CM | POA: Diagnosis not present

## 2023-05-12 LAB — BASIC METABOLIC PANEL
Anion gap: 10 (ref 5–15)
BUN: 33 mg/dL — ABNORMAL HIGH (ref 8–23)
CO2: 24 mmol/L (ref 22–32)
Calcium: 7.6 mg/dL — ABNORMAL LOW (ref 8.9–10.3)
Chloride: 106 mmol/L (ref 98–111)
Creatinine, Ser: 1.36 mg/dL — ABNORMAL HIGH (ref 0.44–1.00)
GFR, Estimated: 43 mL/min — ABNORMAL LOW (ref >60)
Glucose, Bld: 171 mg/dL — ABNORMAL HIGH (ref 70–99)
Potassium: 4 mmol/L (ref 3.5–5.1)
Sodium: 140 mmol/L (ref 135–145)

## 2023-05-12 LAB — CBC
HCT: 36.2 % (ref 36.0–46.0)
Hemoglobin: 10.9 g/dL — ABNORMAL LOW (ref 12.0–15.0)
MCH: 25.9 pg — ABNORMAL LOW (ref 26.0–34.0)
MCHC: 30.1 g/dL (ref 30.0–36.0)
MCV: 86 fL (ref 80.0–100.0)
Platelets: 250 10*3/uL (ref 150–400)
RBC: 4.21 MIL/uL (ref 3.87–5.11)
RDW: 15.4 % (ref 11.5–15.5)
WBC: 5.8 10*3/uL (ref 4.0–10.5)
nRBC: 0 % (ref 0.0–0.2)

## 2023-05-12 LAB — TROPONIN I (HIGH SENSITIVITY)
Troponin I (High Sensitivity): 22 ng/L — ABNORMAL HIGH (ref <18)
Troponin I (High Sensitivity): 35 ng/L — ABNORMAL HIGH (ref <18)

## 2023-05-12 LAB — CBG MONITORING, ED: Glucose-Capillary: 180 mg/dL — ABNORMAL HIGH (ref 70–99)

## 2023-05-12 LAB — BRAIN NATRIURETIC PEPTIDE: B Natriuretic Peptide: 111 pg/mL — ABNORMAL HIGH (ref 0.0–100.0)

## 2023-05-12 NOTE — ED Triage Notes (Signed)
Pt arrived [facility] for c/o chest pain that started about 1 hr ago. Pt is ambulatory. Pt c/o midchest pain and some nausea. No vomiting or SOB.

## 2023-05-12 NOTE — Discharge Instructions (Signed)
You were seen in the emergency room for evaluation of generalized fatigue after taking her NovoLog.  Your workup here in the emergency department was reassuring and I did speak with a cardiologist and we are both in agreements that your symptoms today likely are not due to persistent fluid around your heart as the pericardial window will likely protect you from this.  There is a small amount of fluid in your lungs and we would ask you to take one of your Lasix pills at night for the next 3 days in addition to your morning dose.  Return to the emergency department if you have any worsening chest pain, shortness of breath, fever, sweating or any other concerning symptoms.

## 2023-05-12 NOTE — ED Notes (Signed)
Pt while at a stand O2 was at a 97%. Pt walked 20 steps O2 was in between 97% and 98%. Pt walked 20 more steps and O2 stayed in between 97% and 98%.

## 2023-05-12 NOTE — ED Provider Notes (Signed)
[place] EMERGENCY DEPARTMENT AT [facility] Provider Note  CSN: [id number] Arrival date & time: 05/12/23 1104  Chief Complaint(s) Chest Pain  HPI Maureen Jackson is a 67 y.o. female with PMH T2DM, HTN, CAD status post DES placement in 2018, CHF, CKD 3, pericardial effusion status post pericardial window in January 2024 who presents emergency department for evaluation of generalized malaise.  Chief complaint states chest pain but patient is currently and has never complained of chest pain today.  She states that she took her NovoLog this morning and shortly after taking the NovoLog felt ill, shaky and lightheaded.  No sugar taken on arrival by EMS.  Here in the emergency room, patient endorsing generalized malaise and fatigue but is currently denying chest pain, shortness of breath, abdominal pain, nausea, vomiting or any other systemic symptoms.   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 multivessel CAD by cath in 09/2017 and not felt to be a CABG candidate --> underwent two-vessel PCI with DES to the LAD and DES to the RCA   Diabetes mellitus    Diabetes mellitus, type II (HCC)    Dyspnea    Glaucoma    Hypertension    Left bundle branch block    Morbid obesity (HCC)    Obstructive sleep apnea    does not wear CPAP   Thyroid disease    Patient Active Problem List   Diagnosis Date Noted   Cardiac tamponade 12/21/2022   Acute on chronic combined systolic and diastolic CHF (congestive heart failure) (HCC) 12/20/2022   Acute on chronic combined systolic (congestive) and diastolic (congestive) heart failure (HCC) 05/18/2020   Acute respiratory disease due to COVID-19 virus 12/26/2019   Pressure injury of skin 09/21/2019   Drug rash    Abscess    Fistula    Fournier's  gangrene in female [facility])    Chest pain 07/16/2018   Mixed hyperlipidemia 04/10/2018   TIA (transient ischemic attack) 03/14/2018   Vertigo 03/14/2018   Chronic combined systolic and diastolic CHF (congestive heart failure) (EF 30 to 35 %) 03/14/2018   Ischemic cardiomyopathy 11/06/2017   Coronary artery disease involving native coronary artery of native heart without angina pectoris    Pericardial effusion 09/07/2017   Acute combined systolic (congestive) and diastolic (congestive) heart failure (HCC) 09/06/2017   Cardiomegaly 09/05/2017   Morbid obesity/BMI > 55 03/04/2013   Labyrinthitis 03/04/2013   DM type 2 causing vascular disease (HCC) 03/04/2013   GASTRITIS 12/13/2006   HIATAL HERNIA, HX OF 12/13/2006   THYROIDECTOMY, HX OF 12/13/2006   Hypothyroidism 10/04/2006   TOBACCO ABUSE 10/04/2006   CARPAL TUNNEL SYNDROME 10/04/2006   Essential hypertension, benign 10/04/2006   GERD 10/04/2006   POSTMENOPAUSAL STATUS 10/04/2006   SKIN TAG 10/04/2006   KNEE PAIN, LEFT 10/04/2006   Sleep apnea 10/04/2006   LEG EDEMA 10/04/2006   Home Medication(s) Prior to Admission medications   Medication Sig Start Date End Date Taking? Authorizing Provider  acetaminophen (TYLENOL) 325 MG tablet Take 2 tablets (650 mg total) by mouth every 4 (four) hours as needed for mild pain, fever or headache. 05/25/20  Yes Emokpae, Courage, MD  atorvastatin (LIPITOR) 80 MG tablet TAKE 1 TABLET(80 MG) BY MOUTH DAILY Patient  taking differently: Take 80 mg by mouth daily. 04/23/23  Yes Branch, Dorothe Pea, MD  calcium carbonate (TUMS - DOSED IN MG ELEMENTAL CALCIUM) 500 MG chewable tablet Chew 4 tablets by mouth every evening.   Yes [provider]  cetirizine (ZYRTEC) 10 MG tablet Take 10 mg by mouth daily as needed for allergies.  09/12/18  Yes [provider]  clopidogrel (PLAVIX) 75 MG tablet TAKE 1 TABLET(75 MG) BY MOUTH DAILY 03/27/22  Yes Branch, Dorothe Pea, MD  COMBIGAN 0.2-0.5 % ophthalmic  solution Place 1 drop into both eyes 2 (two) times daily. 02/08/22  Yes [provider]  diclofenac Sodium (VOLTAREN) 1 % GEL Apply 2 g topically daily as needed (arthritis).   Yes [provider]  furosemide (LASIX) 40 MG tablet Take 1.5 tablets (60 mg total) by mouth daily. 01/23/23  Yes Simmons, Brittainy M, PA-C  JARDIANCE 25 MG TABS tablet Take 25 mg by mouth daily. 08/02/22  Yes [provider]  levothyroxine (SYNTHROID) 200 MCG tablet Take 200 mcg by mouth daily before breakfast. Take 1 tablet with for a total dose of daily   Yes [provider]  levothyroxine (SYNTHROID) 50 MCG tablet Take 50 mcg by mouth daily before breakfast. Take 1 tablet with for a total dose of daily   Yes [provider]  metoprolol succinate (TOPROL-XL) 25 MG 24 hr tablet Take 1 tablet (25 mg total) by mouth daily. 03/01/23  Yes Sharlene Dory, NP  NOVOLOG FLEXPEN 100 UNIT/ML FlexPen Inject 15 Units into the skin 3 (three) times daily with meals. 05/03/23  Yes [provider]  OZEMPIC, 0.25 OR 0.5 MG/DOSE, 2 MG/3ML SOPN Inject 0.5 mg into the skin once a week. 05/03/23  Yes [provider]  sacubitril-valsartan (ENTRESTO) 24-26 MG Take 1 tablet by mouth 2 (two) times daily. 03/07/23  Yes Branch, Dorothe Pea, MD  senna-docusate (SENOKOT-S) 8.6-50 MG tablet Take 2 tablets by mouth 2 (two) times daily. 05/25/20  Yes Emokpae, Courage, MD  spironolactone (ALDACTONE) 25 MG tablet Take 1 tablet (25 mg total) by mouth daily. 03/01/23  Yes Sharlene Dory, NP  VENTOLIN HFA 108 (90 Base) MCG/ACT inhaler Inhale 1-2 puffs into the lungs every 6 (six) hours as needed for wheezing or shortness of breath. 05/25/20  Yes Emokpae, Courage, MD  glucose blood (ONETOUCH VERIO) test strip Test glucose 4 times day. 04/19/22   Roma Kayser, MD  insulin aspart (NOVOLOG) 100 UNIT/ML injection Inject 0-15 Units into the skin 3 (three) times daily with  meals. Patient not taking: Reported on 05/12/2023 01/02/23   Tyrone Nine, MD  TRESIBA FLEXTOUCH 100 UNIT/ML FlexTouch Pen Inject 10 Units into the skin at bedtime. Patient not taking: Reported on 05/12/2023 01/02/23   Tyrone Nine, MD  Past Surgical History Past Surgical History:  Procedure Laterality Date   ABDOMINAL HYSTERECTOMY     CARDIAC CATHETERIZATION  09/12/2017   CORONARY STENT INTERVENTION  09/12/2017   STENT RESOLUTE ONYX 1.61W96 drug eluting stent was successfully placed   CORONARY STENT INTERVENTION N/A 09/12/2017   Procedure: CORONARY STENT INTERVENTION;  Surgeon: Marykay Lex, MD;  Location: [facility] INVASIVE CV LAB;  Service: Cardiovascular;  Laterality: N/A;   INCISION AND DRAINAGE PERIRECTAL ABSCESS Left 09/16/2019   Procedure: IRRIGATION AND DEBRIDEMENT LABIA ABSCESS;  Surgeon: Abigail Miyamoto, MD;  Location: [facility] OR;  Service: General;  Laterality: Left;   IR FLUORO GUIDE CV LINE RIGHT  09/16/2019   IR US GUIDE VASC ACCESS RIGHT  09/16/2019   LEFT HEART CATH AND CORONARY ANGIOGRAPHY N/A 09/12/2017   Procedure: LEFT HEART CATH AND CORONARY ANGIOGRAPHY;  Surgeon: Marykay Lex, MD;  Location: [facility] INVASIVE CV LAB;  Service: Cardiovascular;  Laterality: N/A;   MASS EXCISION N/A 12/18/2018   Procedure: EXCISION TONGUE MASS;  Surgeon: Newman Pies, MD;  Location[facility] OR;  Service: ENT;  Laterality: N/A;   PERICARDIOCENTESIS N/A 12/22/2022   Procedure: PERICARDIOCENTESIS;  Surgeon: Orbie Pyo, MD;  Location: [facility] INVASIVE CV LAB;  Service: Cardiovascular;  Laterality: N/A;   RIGHT/LEFT HEART CATH AND CORONARY ANGIOGRAPHY N/A 09/10/2017   Procedure: RIGHT/LEFT HEART CATH AND CORONARY ANGIOGRAPHY;  Surgeon: Lennette Bihari, MD;  Location: [facility] INVASIVE CV LAB;  Service: Cardiovascular;  Laterality: N/A;   SUBXYPHOID PERICARDIAL WINDOW N/A 12/23/2022   Procedure:  SUBXYPHOID PERICARDIAL WINDOW;  Surgeon: Eugenio Hoes, MD;  Location: [facility] OR;  Service: Thoracic;  Laterality: N/A;   TEE WITHOUT CARDIOVERSION N/A 12/23/2022   Procedure: TRANSESOPHAGEAL ECHOCARDIOGRAM (TEE);  Surgeon: Eugenio Hoes, MD;  Location: [facility] OR;  Service: Thoracic;  Laterality: N/A;   THYROID SURGERY     Family History Family History  Problem Relation Age of Onset   Diabetes Mother    Hypertension Mother    Cancer Mother        pancreas   Hypertension Sister     Social History Social History   Tobacco Use   Smoking status: Former    Types: Cigarettes    Quit date: 12/14/2005    Years since quitting: 17.4    Passive exposure: Never   Smokeless tobacco: Never   Tobacco comments:    QUIT IN 2005  Vaping Use   Vaping Use: Never used  Substance Use Topics   Alcohol use: No   Drug use: No   Allergies Ampicillin, Aspirin, Hydrocodone, and Unasyn [ampicillin-sulbactam sodium]  Review of Systems Review of Systems  Constitutional:  Positive for fatigue.  Neurological:  Positive for light-headedness.    Physical Exam Vital Signs  I have reviewed the triage vital signs BP 131/71 (BP Location: Right Arm)   Pulse 80   Temp 98.3 F (36.8 C)   Resp 18   Ht 5\' 5"  (1.651 m)   Wt (!) 186 kg   SpO2 99%   BMI 68.23 kg/m   Physical Exam Vitals and nursing note reviewed.  Constitutional:      General: She is not in acute distress.    Appearance: She is well-developed.  HENT:     Head: Normocephalic and atraumatic.  Eyes:     Conjunctiva/sclera: Conjunctivae normal.  Cardiovascular:     Rate and Rhythm: Normal rate and regular rhythm.     Heart sounds: No murmur heard. Pulmonary:     Effort: Pulmonary effort is  normal. No respiratory distress.     Breath sounds: Normal breath sounds.  Abdominal:     Palpations: Abdomen is soft.     Tenderness: There is no abdominal tenderness.  Musculoskeletal:        General: No swelling.     Cervical back: Neck supple.   Skin:    General: Skin is warm and dry.     Capillary Refill: Capillary refill takes less than 2 seconds.  Neurological:     Mental Status: She is alert.  Psychiatric:        Mood and Affect: Mood normal.     ED Results and Treatments Labs (all labs ordered are listed, but only abnormal results are displayed) Labs Reviewed  BASIC METABOLIC PANEL - Abnormal; Notable for the following components:      Result Value   Glucose, Bld 171 (*)    BUN 33 (*)    Creatinine, Ser 1.36 (*)    Calcium 7.6 (*)    GFR, Estimated 43 (*)    All other components within normal limits  CBC - Abnormal; Notable for the following components:   Hemoglobin 10.9 (*)    MCH 25.9 (*)    All other components within normal limits  BRAIN NATRIURETIC PEPTIDE - Abnormal; Notable for the following components:   B Natriuretic Peptide 111.0 (*)    All other components within normal limits  CBG MONITORING, ED - Abnormal; Notable for the following components:   Glucose-Capillary 180 (*)    All other components within normal limits  TROPONIN I (HIGH SENSITIVITY) - Abnormal; Notable for the following components:   Troponin I (High Sensitivity) 22 (*)    All other components within normal limits  TROPONIN I (HIGH SENSITIVITY) - Abnormal; Notable for the following components:   Troponin I (High Sensitivity) 35 (*)    All other components within normal limits                                                                                                                          Radiology DG Chest 2 View  Result Date: 05/12/2023 CLINICAL DATA:  Chest pain EXAM: CHEST - 2 VIEW COMPARISON:  Two-view chest x-ray 01/24/2023 FINDINGS: The heart is enlarged. Mild edema is now present. No significant effusions are present. No focal airspace consolidation is present. The visualized soft tissues and bony thorax are unremarkable. IMPRESSION: Cardiomegaly and mild edema compatible with congestive heart failure. Electronically  Signed   By: Marin Roberts M.D.   On: 05/12/2023 11:53    Pertinent labs & imaging results that were available during my care of the patient were reviewed by me and considered in my medical decision making (see MDM for details).  Medications Ordered in ED Medications - No data to display  Procedures Procedures  (including critical care time)  Medical Decision Making / ED Course   This patient presents to the ED for concern of malaise, lightheadedness, this involves an extensive number of treatment options, and is a complaint that carries with it a high risk of complications and morbidity.  The differential diagnosis includes atypical ACS, electrolyte abnormality, hypoglycemia, dehydration, recurrent pericardial effusion, pneumonia, viral illness, panic attack  MDM: Patient seen emergency room for evaluation of generalized malaise, lightheadedness and fatigue.  Physical exam is unremarkable.  Initial glucose 180.  Laboratory evaluation with a hemoglobin of 10.9, BUN 33, creatinine 1.36 which is near patient's baseline.  BNP 111.0 and is downtrending from previous.  High-sensitivity troponin is 22 with delta troponin 35 likely secondary to patient's underlying CHF and type II demand ischemia.  Chest x-ray with some mild CHF but no overt pulmonary edema.  ECG with left bundle branch block and an axis reversal with QRS reversal in lead III and I spoke with Dr. Antoine Poche of cardiology who reviewed the ECG and agrees that this is not indicative of ischemia and also agrees with relatively flat but mildly uptrending troponins as type II demand ischemia.  I had significant difficulty obtaining bedside cardiac windows due to obstructing bowel gas but given the lack of hypotension, shortness of breath and patient's history of pericardial window, Dr. Antoine Poche has low suspicion  for tamponade today and reaccumulation of fluid and I agree.  On my reevaluation her symptoms have completely resolved and the patient is requesting discharge.  I did speak with the patient about her rising troponin and patient would like to forego additional laboratory testing today given symptom resolution which I think is unreasonable.  An amatory pulse ox trial was performed and patient was able to ambulate without difficulty and no hypoxia or tachycardia was witnessed.  She was given strict return precautions of which she voiced understanding she was discharged with close outpatient PCP/cardiology follow-up.  Patient then discharged   Additional history obtained:  -External records from outside source obtained and reviewed including: Chart review including previous notes, labs, imaging, consultation notes   Lab Tests: -I ordered, reviewed, and interpreted labs.   The pertinent results include:   Labs Reviewed  BASIC METABOLIC PANEL - Abnormal; Notable for the following components:      Result Value   Glucose, Bld 171 (*)    BUN 33 (*)    Creatinine, Ser 1.36 (*)    Calcium 7.6 (*)    GFR, Estimated 43 (*)    All other components within normal limits  CBC - Abnormal; Notable for the following components:   Hemoglobin 10.9 (*)    MCH 25.9 (*)    All other components within normal limits  BRAIN NATRIURETIC PEPTIDE - Abnormal; Notable for the following components:   B Natriuretic Peptide 111.0 (*)    All other components within normal limits  CBG MONITORING, ED - Abnormal; Notable for the following components:   Glucose-Capillary 180 (*)    All other components within normal limits  TROPONIN I (HIGH SENSITIVITY) - Abnormal; Notable for the following components:   Troponin I (High Sensitivity) 22 (*)    All other components within normal limits  TROPONIN I (HIGH SENSITIVITY) - Abnormal; Notable for the following components:   Troponin I (High Sensitivity) 35 (*)    All other  components within normal limits      EKG   EKG Interpretation  Date/Time:  Saturday May 12 2023 11:18:33 EDT  Ventricular Rate:  81 PR Interval:  152 QRS Duration: 162 QT Interval:  441 QTC Calculation: 512 R Axis:   -40 Text Interpretation: Sinus rhythm Left bundle branch block axis change from previous with QRS reversal in III Confirmed by Maddox Bratcher ([id number]) on 05/12/2023 11:21:29 AM         Imaging Studies ordered: I ordered imaging studies including chest x-ray I independently visualized and interpreted imaging. I agree with the radiologist interpretation   Medicines ordered and prescription drug management: No orders of the defined types were placed in this encounter.   -I have reviewed the patients home medicines and have made adjustments as needed  Critical interventions none  Consultations Obtained: I requested consultation with the cardiologist Dr. Antoine Poche,  and discussed lab and imaging findings as well as pertinent plan - they recommend: Outpatient follow-up   Cardiac Monitoring: The patient was maintained on a cardiac monitor.  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 multivessel CAD by cath in 09/2017 and not felt to be a CABG candidate --> underwent two-vessel PCI with DES to the LAD and DES to the RCA   Diabetes mellitus    Diabetes mellitus, type II (HCC)    Dyspnea    Glaucoma    Hypertension    Left bundle  branch block    Morbid obesity (HCC)    Obstructive sleep apnea    does not wear CPAP   Thyroid disease       Dispostion: I considered admission for this patient, but at this time with symptoms resolved, patient does not meet inpatient criteria for admission and she is safe for discharge with outpatient follow-up with strict return precautions     Final Clinical Impression(s) / ED Diagnoses Final diagnoses:  Malaise     @PCDICTATION @    Glendora Score, MD 05/12/23 1905

## 2023-05-16 ENCOUNTER — Telehealth: Payer: Self-pay

## 2023-05-16 NOTE — Telephone Encounter (Signed)
Transition Care Management Unsuccessful Follow-up Telephone Call  Date of discharge and from where:  [facility] 6[facility]  Attempts:  1st Attempt  Reason for unsuccessful TCM follow-up call:  Left voice message   Lenard Forth [facility] Guide, [facility] [phone] [address] [place], [place], [place] [address] Phone: [phone] Email: [email or web address]

## 2023-05-17 ENCOUNTER — Telehealth: Payer: Self-pay

## 2023-05-17 NOTE — Telephone Encounter (Signed)
Transition Care Management Unsuccessful Follow-up Telephone Call  Date of discharge and from where:  [facility] 6/1  Attempts:  2nd Attempt  Reason for unsuccessful TCM follow-up call:  Left voice message   [facility] Guide, [place] [phone] [address], [place], [place] [address] Phone: [phone] Email: [email or web address]       

## 2023-05-25 ENCOUNTER — Ambulatory Visit: Payer: 59 | Admitting: Nurse Practitioner

## 2023-06-12 ENCOUNTER — Ambulatory Visit: Payer: 59 | Admitting: Nurse Practitioner

## 2023-06-12 ENCOUNTER — Telehealth: Payer: Self-pay | Admitting: Cardiology

## 2023-06-12 MED ORDER — CLOPIDOGREL BISULFATE 75 MG PO TABS: ORAL_TABLET | ORAL | 0 refills | Status: DC

## 2023-06-12 NOTE — Telephone Encounter (Signed)
*  STAT* If patient is at the pharmacy, call can be transferred to refill team.   1. Which medications need to be refilled? (please list name of each medication and dose if known) clopidogrel (PLAVIX) 75 MG tablet   2. Which pharmacy/location (including street and city if local pharmacy) is medication to be sent to? [id number] - [place], [place] - [address]    3. Do they need a 30 day or 90 day supply? 90 day

## 2023-07-09 DIAGNOSIS — E038 Other specified hypothyroidism: Secondary | ICD-10-CM | POA: Diagnosis not present

## 2023-07-09 DIAGNOSIS — M1710 Unilateral primary osteoarthritis, unspecified knee: Secondary | ICD-10-CM | POA: Diagnosis not present

## 2023-07-09 DIAGNOSIS — J4520 Mild intermittent asthma, uncomplicated: Secondary | ICD-10-CM | POA: Diagnosis not present

## 2023-07-09 DIAGNOSIS — E1121 Type 2 diabetes mellitus with diabetic nephropathy: Secondary | ICD-10-CM | POA: Diagnosis not present

## 2023-07-09 DIAGNOSIS — Z0000 Encounter for general adult medical examination without abnormal findings: Secondary | ICD-10-CM | POA: Diagnosis not present

## 2023-07-09 DIAGNOSIS — I10 Essential (primary) hypertension: Secondary | ICD-10-CM | POA: Diagnosis not present

## 2023-07-09 DIAGNOSIS — I5022 Chronic systolic (congestive) heart failure: Secondary | ICD-10-CM | POA: Diagnosis not present

## 2023-07-09 DIAGNOSIS — M5459 Other low back pain: Secondary | ICD-10-CM | POA: Diagnosis not present

## 2023-07-09 DIAGNOSIS — N1831 Chronic kidney disease, stage 3a: Secondary | ICD-10-CM | POA: Diagnosis not present

## 2023-07-12 ENCOUNTER — Encounter (INDEPENDENT_AMBULATORY_CARE_PROVIDER_SITE_OTHER): Payer: Self-pay | Admitting: *Deleted

## 2023-07-12 ENCOUNTER — Encounter: Payer: Self-pay | Admitting: Nurse Practitioner

## 2023-07-12 ENCOUNTER — Ambulatory Visit: Payer: 59 | Attending: Nurse Practitioner | Admitting: Nurse Practitioner

## 2023-07-12 VITALS — BP 130/80 | HR 77 | Ht 65.0 in | Wt 337.8 lb

## 2023-07-12 DIAGNOSIS — I2510 Atherosclerotic heart disease of native coronary artery without angina pectoris: Secondary | ICD-10-CM | POA: Diagnosis not present

## 2023-07-12 DIAGNOSIS — I10 Essential (primary) hypertension: Secondary | ICD-10-CM

## 2023-07-12 DIAGNOSIS — I3139 Other pericardial effusion (noninflammatory): Secondary | ICD-10-CM | POA: Diagnosis not present

## 2023-07-12 DIAGNOSIS — I5022 Chronic systolic (congestive) heart failure: Secondary | ICD-10-CM | POA: Diagnosis not present

## 2023-07-12 DIAGNOSIS — N183 Chronic kidney disease, stage 3 unspecified: Secondary | ICD-10-CM | POA: Diagnosis not present

## 2023-07-12 DIAGNOSIS — I255 Ischemic cardiomyopathy: Secondary | ICD-10-CM

## 2023-07-12 DIAGNOSIS — N1831 Chronic kidney disease, stage 3a: Secondary | ICD-10-CM

## 2023-07-12 NOTE — Patient Instructions (Addendum)
Medication Instructions:  Your physician recommends that you continue on your current medications as directed. Please refer to the Current Medication list given to you today.  Labwork: None  Testing/Procedures: None  Follow-Up: Your physician recommends that you schedule a follow-up appointment in: 3-4 Months or sooner if needed  Any Other Special Instructions Will Be Listed Below (If Applicable).  If you need a refill on your cardiac medications before your next appointment, please call your pharmacy.

## 2023-07-12 NOTE — Progress Notes (Addendum)
Office Visit    Patient Name: Maureen Jackson Date of Encounter: 07/12/2023 PCP:  Toma Deiters, MD [facility]  Cardiologist:  Dina Rich, MD Advanced Practice Provider:  Sharlene Dory, NP Electrophysiologist:  None  Chief Complaint and HPI    Maureen Jackson is a 67 y.o. female with a hx of CAD, ischemic cardiomyopathy/chronic systolic CHF, pericardial effusion, s/p pericardial window 12/2022, PAD, hypertension, type 2 diabetes, OSA (does not wear CPAP), thyroid disease, left bundle branch block, CKD, and morbid obesity, who presents today for 2-3 month follow-up.   Admitted with acute onset CHF in 2018.  TTE EF 30 to 35%, grade 1 DD.  Cath report noted below, noted to have severe MV CAD.  Evaluated by CT surgery, rec for PCI as opposed to CABG and received DES to LAD and DES x 2 to RCA, left circumflex disease medically managed.  In 2021 echo revealed EF 30%, moderate pericardial effusion.  In April 2023, estimated EF 40 to 50%.   Admitted 12/2022 with a large pericardial effusion, had unsuccessful pericardiocentesis and pericardial window performed.  HF meds decreased due to tamponade/low BPs. Followed in heart failure clinic.  Last seen by Dr. Dina Rich on January 31, 2023.  Was overall euvolemic on exam.  Repeat labs were obtained due to increase in Lasix.  Dr. Wyline Mood recommended to monitor at this time, recommended titrating HF meds at future follow-up if BPs remain stable.  Was felt that pericardial effusion was likely secondary due to hypothyroidism.  Since I last saw patient 02/2023, she has had 2 ED visits. One ED visit 02/2023 for a fall. Was ambulating w/o assistance, landed on left knee. Denied hitting head or LOC. X-rays notable for effusion and OA but not fracture/dislocation. HH order placed along with referral for orthopedic surgery. ED visit 05/2023 for malaise shortly after taking NovoLog. High sensitivity trop 22, delta trop 35, felt  likely secondary to CHF and type 2 demand ischemia. CXR showed mild CHF, no overt pulmonary edema.   Today she presents for follow-up.  She is doing well.  Weight remains stable. Denies any chest pain or shortness of breath. Admits to dependent edema, but overall stable. Denies any palpitations, syncope, presyncope, dizziness, orthopnea, PND, significant weight changes, acute bleeding, or claudication. Would like to know if she has CKD as she was told by another provider that she has this condition.  EKGs/Labs/Other Studies Reviewed:   The following studies were reviewed today:  EKG:  EKG is not ordered today.    TTE limited 12/2022:   1. Large pericardial effusion up to 4.8 cm anterior to RV (subcostal) and  2.8 cm posterior to LV. The RV appears small and underfilled. There is  systolic collapse of the RA. The IVC is dilated. Overall, findings are  suspicious for cardiac tamponade, but   clinical correlation is recommended. Large pericardial effusion. The  pericardial effusion is posterior and lateral to the left ventricle and  anterior to the right ventricle.   2. Left ventricular ejection fraction, by estimation, is 30 to 35%. The  left ventricle has moderately decreased function. Left ventricular  endocardial border not optimally defined to evaluate regional wall motion.   3. Right ventricular systolic function is normal. The right ventricular  size is normal.   4. The inferior vena cava is dilated in size with >50% respiratory  variability, suggesting right atrial pressure of 8 mmHg.  Review of Systems    All other  systems reviewed and are otherwise negative except as noted above.  Physical Exam    VS:  BP 130/80   Pulse 77   Ht 5\' 5"  (1.651 m)   Wt (!) 337 lb 12.8 oz (153.2 kg)   SpO2 99%   BMI 56.21 kg/m  , BMI Body mass index is 56.21 kg/m.  Wt Readings from Last 3 Encounters:  07/12/23 (!) 337 lb 12.8 oz (153.2 kg)  05/12/23 (!) 410 lb (186 kg)  03/09/23 (!) 335  lb (152 kg)     GEN: Morbidly obese, 67 y.o. female in no acute distress. HEENT: normal. Neck: Supple, no JVD, carotid bruits, or masses. Cardiac: S1/S2, RRR, no murmurs, rubs, or gallops. No clubbing, cyanosis. Nonpitting, dependent edema along BLE.  Radials/PT 2+ and equal bilaterally.  Respiratory:  Respirations regular and unlabored, clear to auscultation bilaterally. MS: No deformity or atrophy. Skin: Warm and dry, no rash. Neuro:  Strength and sensation are intact. Psych: Normal affect.  Assessment & Plan   Chronic systolic CHF, ICM Stage C, NYHA class I-II symptoms. TTE 12/2022 revealed EF 30-35%. Wt is stable. Does not appear volume overloaded, overall euvolemic and well compensated on exam.  Continue current GDMT.  Low sodium diet, fluid restriction <2L, and daily weights encouraged. Educated to contact our office for weight gain of 2 lbs overnight or 5 lbs in one week. She is scheduled to repeat limited echo in a few days as mentioned below.   HTN Blood pressure stable.  Continue current medication regimen.   Discussed to monitor BP at home at least 2 hours after medications and sitting for 5-10 minutes. Heart healthy diet and regular cardiovascular exercise encouraged.   Pericardial effusion Status post pericardial window.  Doing well.  Will repeat limited echo in a few days to reevaluate for pericardial effusion.  CAD Stable with no anginal symptoms. No indication for ischemic evaluation.  Continue Lipitor, Plavix, and Toprol-XL. Heart healthy diet and regular cardiovascular exercise encouraged.   5. CKD stage 3a-3b Most recent labs revealed serum creatinine at 1.36 with eGFR at 43. No medication changes at this time.  Kidney function relatively stable over the last few months.  Did discuss nephrology referral for further evaluation and assistance with medication management, and patient is agreeable to this.  Will refer patient to nephrology. Avoid nephrotoxic agents. Continue  to follow with PCP.   Disposition: Follow up in 3-4 months with Dina Rich, MD or APP.  Signed, Sharlene Dory, NP 07/14/2023, 7:31 AM [facility]

## 2023-07-19 ENCOUNTER — Ambulatory Visit (HOSPITAL_COMMUNITY): Payer: 59

## 2023-08-07 ENCOUNTER — Other Ambulatory Visit: Payer: Self-pay | Admitting: Nurse Practitioner

## 2023-08-07 ENCOUNTER — Other Ambulatory Visit: Payer: Self-pay | Admitting: Cardiology

## 2023-08-20 ENCOUNTER — Ambulatory Visit (HOSPITAL_COMMUNITY)
Admission: RE | Admit: 2023-08-20 | Discharge: 2023-08-20 | Disposition: A | Discharge status: Home / Self Care | Payer: 59 | Source: Ambulatory Visit | Attending: Nurse Practitioner | Admitting: Nurse Practitioner

## 2023-08-20 DIAGNOSIS — I3139 Other pericardial effusion (noninflammatory): Secondary | ICD-10-CM | POA: Diagnosis not present

## 2023-08-20 LAB — ECHOCARDIOGRAM LIMITED: S' Lateral: 4.5 cm

## 2023-08-20 NOTE — Progress Notes (Signed)
*  PRELIMINARY RESULTS* Echocardiogram A limited 2D Echocardiogram has been performed.  Carolyne Fiscal 08/20/2023, 3:08 PM

## 2023-08-21 ENCOUNTER — Telehealth: Payer: Self-pay | Admitting: Cardiology

## 2023-08-21 NOTE — Telephone Encounter (Signed)
Patient called to follow-up on echocardiogram test results.

## 2023-08-23 ENCOUNTER — Telehealth: Payer: Self-pay | Admitting: Cardiology

## 2023-08-23 NOTE — Telephone Encounter (Signed)
[facility] is calling saying they need full documented referral for patient. Requesting more information.

## 2023-08-24 NOTE — Telephone Encounter (Signed)
See results notes.

## 2023-08-27 ENCOUNTER — Encounter: Payer: Self-pay | Admitting: Nurse Practitioner

## 2023-08-27 ENCOUNTER — Telehealth: Payer: Self-pay | Admitting: *Deleted

## 2023-08-27 MED ORDER — OMRON 3 SERIES BP MONITOR DEVI: 1 refills | Status: AC

## 2023-08-27 NOTE — Telephone Encounter (Signed)
Maureen Chris, LPN 08/08/5620  3:08 PM EDT Back to Top    Patient notified and verbalized understanding.  Nurse visit BP check scheduled for 09/11/2023 here in [place] office.  BP monitor sent to [facility].     Maureen Chris, LPN 6/57/8469  6:29 PM EDT     Per Sharlene Dory, NP -   Hesitant to go up without better understanding her BP more recently. Let's bring her back in for a RN visit if she is okay with this and check BP then. Based on that will plan to go up. If we can send her in a cuff and have her log her BP prior to that visit, that would be excellent! - Maybe RN visit in 1-2 weeks if she is okay with this.   Maureen Chris, LPN 05/08/4131  4:40 PM EDT     Notified, copy to pcp.  Patient states that she has no way to check BP at home & doesn't know what it typically runs.  Just to make sure - still want her to go up on the Entresto dose?  Next OV is not until November.   Sharlene Dory, NP 08/24/2023  7:21 AM EDT     Patient does not have MyChart. Echo reviewed and shows reduced heart pumping function, stable from previous study. There was limited visualization during the study, but appears the pericardial effusion was determined to be trivial, which is reassuring.   She was doing well when I last saw her. How are her blood pressures doing? Looks like her average systolic blood pressure tends to run around 130. If her BP's are also like this at home or higher, I would recommend increasing Entresto to middle dose (49/51) mg BID and repeating BMET in 7-10 days. Diagnosis is heart failure with reduced ejection fraction. She needs to weigh herself daily and let us know if she gains more than 3 pounds in 1 day or more than 5 pounds in a week.  She needs to limit her salt intake to < 2 grams per day and her fluid intake to < 2 L per day.   Thank you!   Keep follow-up with me as scheduled.   Best, Sharlene Dory, AGNP-C

## 2023-08-29 ENCOUNTER — Encounter: Payer: Self-pay | Admitting: Orthopedic Surgery

## 2023-08-29 ENCOUNTER — Ambulatory Visit (INDEPENDENT_AMBULATORY_CARE_PROVIDER_SITE_OTHER): Payer: 59 | Admitting: Orthopedic Surgery

## 2023-08-29 VITALS — BP 175/98 | HR 94 | Ht 65.0 in | Wt 336.0 lb

## 2023-08-29 DIAGNOSIS — M25562 Pain in left knee: Secondary | ICD-10-CM

## 2023-08-29 NOTE — Addendum Note (Signed)
Addended by: Baird Kay on: 08/29/2023 02:20 PM   Modules accepted: Orders

## 2023-08-29 NOTE — Progress Notes (Signed)
New Patient Visit  Assessment: Maureen Jackson is a 67 y.o. female with the following: Anterior left knee pain  Plan: Maureen Jackson fell directly on the anterior aspect of her left knee, approximately 6 months ago.  She continues to have pain in the anterior knee.  There is some swelling in this area.  No tenderness to palpation along the medial or lateral joint line.  She has good range of motion.  Radiographs demonstrates some arthritis, but I do not think that her current symptoms are intra-articular.  I am concerned about some deconditioning, which is contributing to her ongoing pain.  I recommend she continue with topical treatments, including ice as needed.  We will place a referral for physical therapy.  If she continues to have issues, we can consider an injection.  Follow-up: Return if symptoms worsen or fail to improve.  Subjective:  Chief Complaint  Patient presents with   Knee Pain    Left since fall in March     History of Present Illness: Maureen Jackson is a 67 y.o. female who presents for evaluation of left knee pain.  She states that she fell, and landed directly on the anterior aspect of the left knee.  This happened in March.  She was evaluated in the emergency department.  She has remained sedentary.  She has been having pain over the anterior knee.  When she goes to the grocery store, she must use a motorized cart due to pain in the knee, and progressively worsening pain in the lower back.  No prior injuries to her left knee.  She is not complaining of pain in the medial lateral aspect of the knee.  She has not worked with therapy.  She has not worn a brace.  She has tried Voltaren gel.  No prior injections.  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 multivessel CAD by cath in 09/2017 and not felt to be a CABG candidate --> underwent two-vessel PCI with DES to the LAD and DES to the RCA   Diabetes mellitus    Diabetes mellitus, type II (HCC)    Dyspnea    Glaucoma    Hypertension    Left bundle branch block    Morbid obesity (HCC)    Obstructive sleep apnea    does not wear CPAP   Thyroid disease     Past Surgical History:  Procedure Laterality Date   ABDOMINAL HYSTERECTOMY     CARDIAC CATHETERIZATION  09/12/2017   CORONARY STENT INTERVENTION  09/12/2017   STENT RESOLUTE ONYX 1.61W96 drug eluting stent was successfully placed   CORONARY STENT INTERVENTION N/A 09/12/2017   Procedure: CORONARY STENT INTERVENTION;  Surgeon: Marykay Lex, MD;  Location: [facility] INVASIVE CV LAB;  Service: Cardiovascular;  Laterality: N/A;   INCISION AND DRAINAGE PERIRECTAL ABSCESS Left 09/16/2019   Procedure: IRRIGATION AND DEBRIDEMENT LABIA ABSCESS;  Surgeon: Abigail Miyamoto, MD;  Location: [facility] OR;  Service: General;  Laterality: Left;   IR FLUORO GUIDE CV LINE RIGHT  09/16/2019   IR US GUIDE  VASC ACCESS RIGHT  09/16/2019   LEFT HEART CATH AND CORONARY ANGIOGRAPHY N/A 09/12/2017   Procedure: LEFT HEART CATH AND CORONARY ANGIOGRAPHY;  Surgeon: Marykay Lex, MD;  Location: [facility] INVASIVE CV LAB;  Service: Cardiovascular;  Laterality: N/A;   MASS EXCISION N/A 12/18/2018   Procedure: EXCISION TONGUE MASS;  Surgeon: Newman Pies, MD;  Location: [facility] OR;  Service: ENT;  Laterality: N/A;   PERICARDIOCENTESIS N/A 12/22/2022   Procedure: PERICARDIOCENTESIS;  Surgeon: Orbie Pyo, MD;  Location: [facility] INVASIVE CV LAB;  Service: Cardiovascular;  Laterality: N/A;   RIGHT/LEFT HEART CATH AND CORONARY ANGIOGRAPHY N/A 09/10/2017   Procedure: RIGHT/LEFT HEART CATH AND CORONARY ANGIOGRAPHY;  Surgeon: Lennette Bihari, MD;   Location: [facility] INVASIVE CV LAB;  Service: Cardiovascular;  Laterality: N/A;   SUBXYPHOID PERICARDIAL WINDOW N/A 12/23/2022   Procedure: SUBXYPHOID PERICARDIAL WINDOW;  Surgeon: Eugenio Hoes, MD;  Location: [facility] OR;  Service: Thoracic;  Laterality: N/A;   TEE WITHOUT CARDIOVERSION N/A 12/23/2022   Procedure: TRANSESOPHAGEAL ECHOCARDIOGRAM (TEE);  Surgeon: Eugenio Hoes, MD;  Location: [facility] OR;  Service: Thoracic;  Laterality: N/A;   THYROID SURGERY      Family History  Problem Relation Age of Onset   Diabetes Mother    Hypertension Mother    Cancer Mother        pancreas   Hypertension Sister    Social History   Tobacco Use   Smoking status: Former    Current packs/day: 0.00    Types: Cigarettes    Quit date: 12/14/2005    Years since quitting: 17.7    Passive exposure: Never   Smokeless tobacco: Never   Tobacco comments:    QUIT IN 2005  Vaping Use   Vaping status: Never Used  Substance Use Topics   Alcohol use: No   Drug use: No    Allergies  Allergen Reactions   Ampicillin Other (See Comments)    "Allergic," per MAR   Aspirin Nausea Only   Hydrocodone Other (See Comments)    "Allergic," per MAR   Unasyn [Ampicillin-Sulbactam Sodium] Rash and Other (See Comments)    "Allergic," per MAR    Current Meds  Medication Sig   acetaminophen (TYLENOL) 325 MG tablet Take 2 tablets (650 mg total) by mouth every 4 (four) hours as needed for mild pain, fever or headache.   atorvastatin (LIPITOR) 80 MG tablet TAKE 1 TABLET(80 MG) BY MOUTH DAILY (Patient taking differently: Take 80 mg by mouth daily.)   Blood Pressure Monitoring (OMRON 3 SERIES BP MONITOR) DEVI Use as directed   calcium carbonate (TUMS - DOSED IN MG ELEMENTAL CALCIUM) 500 MG chewable tablet Chew 4 tablets by mouth every evening.   cetirizine (ZYRTEC) 10 MG tablet Take 10 mg by mouth daily as needed for allergies.    clopidogrel (PLAVIX) 75 MG tablet TAKE 1 TABLET(75 MG) BY MOUTH DAILY   COMBIGAN 0.2-0.5 % ophthalmic  solution Place 1 drop into both eyes 2 (two) times daily.   diclofenac Sodium (VOLTAREN) 1 % GEL Apply 2 g topically daily as needed (arthritis).   ENTRESTO 24-26 MG TAKE 1 TABLET BY MOUTH TWICE DAILY   furosemide (LASIX) 40 MG tablet Take 1.5 tablets (60 mg total) by mouth daily.   glucose blood (ONETOUCH VERIO) test strip Test glucose 4 times day.   insulin aspart (NOVOLOG) 100 UNIT/ML injection Inject 0-15 Units into the skin 3 (three) times daily with meals.   JARDIANCE 25 MG TABS tablet Take 25 mg by mouth  daily.   levothyroxine (SYNTHROID) 200 MCG tablet Take 200 mcg by mouth daily before breakfast. Take 1 tablet with for a total dose of daily   levothyroxine (SYNTHROID) 50 MCG tablet Take 50 mcg by mouth daily before breakfast. Take 1 tablet with for a total dose of daily   metoprolol succinate (TOPROL-XL) 25 MG 24 hr tablet TAKE 1 TABLET(25 MG) BY MOUTH DAILY   NOVOLOG FLEXPEN 100 UNIT/ML FlexPen Inject 15 Units into the skin 3 (three) times daily with meals.   OZEMPIC, 0.25 OR 0.5 MG/DOSE, 2 MG/3ML SOPN Inject 0.5 mg into the skin once a week.   senna-docusate (SENOKOT-S) 8.6-50 MG tablet Take 2 tablets by mouth 2 (two) times daily.   spironolactone (ALDACTONE) 25 MG tablet Take 1 tablet (25 mg total) by mouth daily.   TRESIBA FLEXTOUCH 100 UNIT/ML FlexTouch Pen Inject 10 Units into the skin at bedtime.   VENTOLIN HFA 108 (90 Base) MCG/ACT inhaler Inhale 1-2 puffs into the lungs every 6 (six) hours as needed for wheezing or shortness of breath.    Objective: BP (!) 175/98   Pulse 94   Ht 5\' 5"  (1.651 m)   Wt (!) 336 lb (152.4 kg)   BMI 55.91 kg/m   Physical Exam:  General: Alert and oriented. and No acute distress. Gait: Left sided antalgic gait.  Left knee without deformity.  Mild swelling over the anterior aspect of the knee.  She has tenderness to palpation directly over the patella.  No tenderness palpation over the medial or lateral joint lines.   No increased laxity to varus or valgus stress.  Negative Lachman.  IMAGING: I personally reviewed images previously obtained from the ED   X-rays from the emergency department demonstrates a mild to moderate degenerative changes throughout the knee.  No acute injuries.   New Medications:  No orders of the defined types were placed in this encounter.     Oliver Barre, MD  08/29/2023 2:15 PM

## 2023-08-29 NOTE — Patient Instructions (Signed)
Continue to use topical treatment such as Voltaren gel, consider Biofreeze or similar.  Also consider using crutches.   You will benefit from physical therapy.  We will place referral today.   Ice the knee if it is swollen and painful.   Follow-up as needed.

## 2023-08-29 NOTE — Telephone Encounter (Signed)
Maureen Jackson from [facility] is requesting a callback at [phone] regarding them receiving the referral with just labs but they need a full referral and would like to speak with a nurse further. Please advise

## 2023-09-03 ENCOUNTER — Other Ambulatory Visit: Payer: Self-pay | Admitting: Nurse Practitioner

## 2023-09-06 ENCOUNTER — Telehealth: Payer: Self-pay | Admitting: Cardiology

## 2023-09-06 DIAGNOSIS — I5022 Chronic systolic (congestive) heart failure: Secondary | ICD-10-CM

## 2023-09-06 NOTE — Telephone Encounter (Signed)
Patient wasn't able to get a bp cup and needed to cancel her nurse visit. Please advise

## 2023-09-11 ENCOUNTER — Ambulatory Visit: Payer: 59

## 2023-09-12 NOTE — Addendum Note (Signed)
Addended by: Eustace Moore on: 09/12/2023 09:02 AM   Modules accepted: Orders

## 2023-09-14 ENCOUNTER — Ambulatory Visit (HOSPITAL_COMMUNITY): Payer: 59 | Admitting: Physical Therapy

## 2023-09-17 NOTE — Telephone Encounter (Signed)
Patient informed and verbalized understanding of plan. 

## 2023-10-08 ENCOUNTER — Emergency Department (HOSPITAL_COMMUNITY): Payer: 59

## 2023-10-08 ENCOUNTER — Encounter (HOSPITAL_COMMUNITY): Payer: Self-pay

## 2023-10-08 ENCOUNTER — Emergency Department (HOSPITAL_COMMUNITY)
Admission: EM | Admit: 2023-10-08 | Discharge: 2023-10-08 | Disposition: A | Discharge status: Home / Self Care | Payer: 59

## 2023-10-08 ENCOUNTER — Other Ambulatory Visit: Payer: Self-pay

## 2023-10-08 DIAGNOSIS — R0602 Shortness of breath: Secondary | ICD-10-CM | POA: Diagnosis not present

## 2023-10-08 DIAGNOSIS — Z79899 Other long term (current) drug therapy: Secondary | ICD-10-CM | POA: Diagnosis not present

## 2023-10-08 DIAGNOSIS — R600 Localized edema: Secondary | ICD-10-CM | POA: Diagnosis not present

## 2023-10-08 DIAGNOSIS — R7989 Other specified abnormal findings of blood chemistry: Secondary | ICD-10-CM | POA: Insufficient documentation

## 2023-10-08 DIAGNOSIS — I517 Cardiomegaly: Secondary | ICD-10-CM | POA: Diagnosis not present

## 2023-10-08 DIAGNOSIS — R0989 Other specified symptoms and signs involving the circulatory and respiratory systems: Secondary | ICD-10-CM | POA: Diagnosis not present

## 2023-10-08 DIAGNOSIS — R059 Cough, unspecified: Secondary | ICD-10-CM | POA: Diagnosis present

## 2023-10-08 DIAGNOSIS — R051 Acute cough: Secondary | ICD-10-CM | POA: Insufficient documentation

## 2023-10-08 DIAGNOSIS — Z7902 Long term (current) use of antithrombotics/antiplatelets: Secondary | ICD-10-CM | POA: Insufficient documentation

## 2023-10-08 DIAGNOSIS — I509 Heart failure, unspecified: Secondary | ICD-10-CM | POA: Diagnosis not present

## 2023-10-08 DIAGNOSIS — I110 Hypertensive heart disease with heart failure: Secondary | ICD-10-CM | POA: Insufficient documentation

## 2023-10-08 DIAGNOSIS — J811 Chronic pulmonary edema: Secondary | ICD-10-CM | POA: Diagnosis not present

## 2023-10-08 LAB — BASIC METABOLIC PANEL
Anion gap: 11 (ref 5–15)
BUN: 21 mg/dL (ref 8–23)
CO2: 21 mmol/L — ABNORMAL LOW (ref 22–32)
Calcium: 7.7 mg/dL — ABNORMAL LOW (ref 8.9–10.3)
Chloride: 107 mmol/L (ref 98–111)
Creatinine, Ser: 1.06 mg/dL — ABNORMAL HIGH (ref 0.44–1.00)
GFR, Estimated: 58 mL/min — ABNORMAL LOW (ref >60)
Glucose, Bld: 209 mg/dL — ABNORMAL HIGH (ref 70–99)
Potassium: 4 mmol/L (ref 3.5–5.1)
Sodium: 139 mmol/L (ref 135–145)

## 2023-10-08 LAB — CBC
HCT: 36.2 % (ref 36.0–46.0)
Hemoglobin: 10.5 g/dL — ABNORMAL LOW (ref 12.0–15.0)
MCH: 23.8 pg — ABNORMAL LOW (ref 26.0–34.0)
MCHC: 29 g/dL — ABNORMAL LOW (ref 30.0–36.0)
MCV: 81.9 fL (ref 80.0–100.0)
Platelets: 279 10*3/uL (ref 150–400)
RBC: 4.42 MIL/uL (ref 3.87–5.11)
RDW: 15.4 % (ref 11.5–15.5)
WBC: 7 10*3/uL (ref 4.0–10.5)
nRBC: 0 % (ref 0.0–0.2)

## 2023-10-08 LAB — BRAIN NATRIURETIC PEPTIDE: B Natriuretic Peptide: 174 pg/mL — ABNORMAL HIGH (ref 0.0–100.0)

## 2023-10-08 LAB — MAGNESIUM: Magnesium: 2.2 mg/dL (ref 1.7–2.4)

## 2023-10-08 MED ORDER — BENZONATATE 100 MG PO CAPS
200.0000 mg | ORAL_CAPSULE | Freq: Once | ORAL | Status: AC
  Administered 2023-10-08: 200 mg via ORAL
  Filled 2023-10-08: qty 2

## 2023-10-08 MED ORDER — BENZONATATE 100 MG PO CAPS: 200.0000 mg | ORAL_CAPSULE | Freq: Three times a day (TID) | ORAL | 0 refills | Status: AC

## 2023-10-08 NOTE — ED Notes (Signed)
Reviewed D/C information with the patient, pt verbalized understanding. No additional concerns at this time. Pt escorted to the lobby via wheelchair to await ride from family.

## 2023-10-08 NOTE — ED Provider Notes (Signed)
[place] EMERGENCY DEPARTMENT AT [facility] Provider Note   CSN: [id number] Arrival date & time: 10/08/23  1810     History  Chief Complaint  Patient presents with   Cough    Maureen Jackson is a 67 y.o. female with a past medical history significant for CHF, hypertension, pericardial effusion presents today for dry cough x 1 week.  Patient states that her cough is worse when laying flat.  Patient denies chest pain or shortness of breath.  Patient says she had chills and bodyaches approximately 1 week ago.  Patient denies sore throat, pain anywhere else, nausea, vomiting, or diarrhea.   Cough      Home Medications Prior to Admission medications   Medication Sig Start Date End Date Taking? Authorizing Provider  benzonatate (TESSALON) 100 MG capsule Take 2 capsules (200 mg total) by mouth 3 (three) times daily for 5 days. 10/08/23 10/13/23 Yes Dolphus Jenny, PA-C  acetaminophen (TYLENOL) 325 MG tablet Take 2 tablets (650 mg total) by mouth every 4 (four) hours as needed for mild pain, fever or headache. 05/25/20   Shon Hale, MD  atorvastatin (LIPITOR) 80 MG tablet TAKE 1 TABLET(80 MG) BY MOUTH DAILY Patient taking differently: Take 80 mg by mouth daily. 04/23/23   Antoine Poche, MD  Blood Pressure Monitoring (OMRON 3 SERIES BP MONITOR) DEVI Use as directed 08/27/23   Sharlene Dory, NP  calcium carbonate (TUMS - DOSED IN MG ELEMENTAL CALCIUM) 500 MG chewable tablet Chew 4 tablets by mouth every evening.    [provider]  cetirizine (ZYRTEC) 10 MG tablet Take 10 mg by mouth daily as needed for allergies.  09/12/18   [provider]  clopidogrel (PLAVIX) 75 MG tablet TAKE 1 TABLET(75 MG) BY MOUTH DAILY 08/07/23   Antoine Poche, MD  COMBIGAN 0.2-0.5 % ophthalmic solution Place 1 drop into both eyes 2 (two) times daily. 02/08/22   [provider]  diclofenac Sodium (VOLTAREN) 1 % GEL Apply 2 g topically daily as needed (arthritis).     [provider]  ENTRESTO 24-26 MG TAKE 1 TABLET BY MOUTH TWICE DAILY 08/07/23   Antoine Poche, MD  furosemide (LASIX) 40 MG tablet Take 1.5 tablets (60 mg total) by mouth daily. 01/23/23   Robbie Lis M, PA-C  glucose blood (ONETOUCH VERIO) test strip Test glucose 4 times day. 04/19/22   Roma Kayser, MD  insulin aspart (NOVOLOG) 100 UNIT/ML injection Inject 0-15 Units into the skin 3 (three) times daily with meals. 01/02/23   Tyrone Nine, MD  JARDIANCE 25 MG TABS tablet Take 25 mg by mouth daily. 08/02/22   [provider]  levothyroxine (SYNTHROID) 200 MCG tablet Take 200 mcg by mouth daily before breakfast. Take 1 tablet with for a total dose of daily    [provider]  levothyroxine (SYNTHROID) 50 MCG tablet Take 50 mcg by mouth daily before breakfast. Take 1 tablet with for a total dose of daily    [provider]  metoprolol succinate (TOPROL-XL) 25 MG 24 hr tablet TAKE 1 TABLET(25 MG) BY MOUTH DAILY 08/07/23   Sharlene Dory, NP  NOVOLOG FLEXPEN 100 UNIT/ML FlexPen Inject 15 Units into the skin 3 (three) times daily with meals. 05/03/23   [provider]  OZEMPIC, 0.25 OR 0.5 MG/DOSE, 2 MG/3ML SOPN Inject 0.5 mg into the skin once a week. 05/03/23   [provider]  senna-docusate (SENOKOT-S) 8.6-50 MG tablet  Take 2 tablets by mouth 2 (two) times daily. 05/25/20   Shon Hale, MD  spironolactone (ALDACTONE) 25 MG tablet TAKE 1 TABLET(25 MG) BY MOUTH DAILY 09/03/23   Sharlene Dory, NP  TRESIBA FLEXTOUCH 100 UNIT/ML FlexTouch Pen Inject 10 Units into the skin at bedtime. 01/02/23   Tyrone Nine, MD  VENTOLIN HFA 108 (90 Base) MCG/ACT inhaler Inhale 1-2 puffs into the lungs every 6 (six) hours as needed for wheezing or shortness of breath. 05/25/20   Shon Hale, MD      Allergies    Ampicillin, Aspirin, Hydrocodone, and Unasyn [ampicillin-sulbactam sodium]    Review of Systems    Review of Systems  Respiratory:  Positive for cough.     Physical Exam Updated Vital Signs BP (!) 152/88   Pulse 86   Temp 97.9 F (36.6 C) (Oral)   Resp 18   Ht 5\' 5"  (1.651 m)   Wt (!) 152 kg   SpO2 98%   BMI 55.76 kg/m  Physical Exam Vitals and nursing note reviewed.  Constitutional:      General: She is not in acute distress.    Appearance: She is well-developed.  HENT:     Head: Normocephalic and atraumatic.     Right Ear: External ear normal.     Left Ear: External ear normal.     Mouth/Throat:     Mouth: Mucous membranes are moist.     Pharynx: No posterior oropharyngeal erythema.  Eyes:     Conjunctiva/sclera: Conjunctivae normal.  Cardiovascular:     Rate and Rhythm: Normal rate and regular rhythm.     Pulses: Normal pulses.     Heart sounds: No murmur heard. Pulmonary:     Effort: Pulmonary effort is normal. No respiratory distress.     Breath sounds: Decreased breath sounds present.     Comments: Patient unable today deep inspiration without coughing. Abdominal:     Palpations: Abdomen is soft.  Musculoskeletal:        General: No swelling.     Cervical back: Neck supple.     Comments: +1 pedal edema bilaterally  Skin:    General: Skin is warm and dry.     Capillary Refill: Capillary refill takes less than 2 seconds.  Neurological:     General: No focal deficit present.     Mental Status: She is alert.  Psychiatric:        Mood and Affect: Mood normal.     ED Results / Procedures / Treatments   Labs (all labs ordered are listed, but only abnormal results are displayed) Labs Reviewed  BASIC METABOLIC PANEL - Abnormal; Notable for the following components:      Result Value   CO2 21 (*)    Glucose, Bld 209 (*)    Creatinine, Ser 1.06 (*)    Calcium 7.7 (*)    GFR, Estimated 58 (*)    All other components within normal limits  BRAIN NATRIURETIC PEPTIDE - Abnormal; Notable for the following components:   B Natriuretic Peptide 174.0 (*)     All other components within normal limits  CBC - Abnormal; Notable for the following components:   Hemoglobin 10.5 (*)    MCH 23.8 (*)    MCHC 29.0 (*)    All other components within normal limits  MAGNESIUM    EKG None  Radiology DG Chest 2 View  Result Date: 10/08/2023 CLINICAL DATA:  Cough shortness of breath EXAM: CHEST - 2 VIEW COMPARISON:  05/12/2023 FINDINGS: Cardiomegaly with vascular congestion and mild edema. No pleural effusion, focal opacity or pneumothorax. Postsurgical changes at the thoracic inlet. IMPRESSION: Cardiomegaly with vascular congestion and mild edema. Electronically Signed   By: Jasmine Pang M.D.   On: 10/08/2023 21:43    Procedures Procedures    Medications Ordered in ED Medications  benzonatate (TESSALON) capsule 200 mg (has no administration in time range)    ED Course/ Medical Decision Making/ A&P                                 Medical Decision Making Amount and/or Complexity of Data Reviewed Labs: ordered. Radiology: ordered.   This patient presents to the ED with chief complaint(s) of dry cough with pertinent past medical history of CHF, hypertension, pericardial effusion which further complicates the presenting complaint. The complaint involves an extensive differential diagnosis and also carries with it a high risk of complications and morbidity.    The differential diagnosis includes CHF exacerbation, viral illness, pneumonia  Additional history obtained: Records reviewed Primary Care Documents  ED Course and Reassessment: Given dose of Tessalon for cough while in ED  Independent labs interpretation:  The following labs were independently interpreted:  BMP: CO2 at 21, mildly elevated creatinine, hypocalcemia at 7.7 BNP: 174 which is not significantly elevated compared to other readings CBC: Mild anemia which is chronic Magnesium: 2.2  Independent visualization of imaging: - I independently visualized the following imaging  with scope of interpretation limited to determining acute life threatening conditions related to emergency care: Chest x-ray, which revealed cardiomegaly with vascular congestion and mild edema which is consistent with previous chest x-rays.  Consultation: - Consulted or discussed management/test interpretation w/ external professional: None  Consideration for admission or further workup: Considered for admission or further workup however patient has remained stable in the ER.  Patient was able to ambulate and maintain her O2 saturation on room air.         Final Clinical Impression(s) / ED Diagnoses Final diagnoses:  Acute cough    Rx / DC Orders ED Discharge Orders          Ordered    benzonatate (TESSALON) 100 MG capsule  3 times daily        10/08/23 2312              Dolphus Jenny, PA-C 10/08/23 2318    Durwin Glaze, MD 10/09/23 [phone]

## 2023-10-08 NOTE — ED Notes (Signed)
Pt ambulated in the room with no assistance. O2 sats remained between 95-97%. NAD.

## 2023-10-08 NOTE — Discharge Instructions (Signed)
Today retreated for cough.  Please pick up your prescription and take as directed.  If your symptoms persist please follow-up with your PCP.  Thank you for letting us treat you today. After reviewing your labs and imaging, I feel you are safe to go home. Please follow up with your PCP in the next several days and provide them with your records from this visit. Return to the Emergency Room if pain becomes severe or symptoms worsen.

## 2023-10-08 NOTE — ED Triage Notes (Addendum)
C/o dry cough x1 week. Cough worse when laying flat.  Hx of chf Denies cp/sob

## 2023-10-15 ENCOUNTER — Encounter (HOSPITAL_COMMUNITY): Payer: 59 | Admitting: Cardiology

## 2023-10-18 ENCOUNTER — Ambulatory Visit: Payer: 59 | Admitting: Nurse Practitioner

## 2023-10-24 ENCOUNTER — Encounter (HOSPITAL_COMMUNITY): Payer: 59 | Admitting: Cardiology

## 2023-11-12 ENCOUNTER — Encounter (HOSPITAL_COMMUNITY): Payer: Self-pay | Admitting: Cardiology

## 2023-11-12 ENCOUNTER — Ambulatory Visit (HOSPITAL_COMMUNITY)
Admission: RE | Admit: 2023-11-12 | Discharge: 2023-11-12 | Disposition: A | Discharge status: Home / Self Care | Payer: 59 | Source: Ambulatory Visit | Attending: Cardiology | Admitting: Cardiology

## 2023-11-12 ENCOUNTER — Other Ambulatory Visit (HOSPITAL_COMMUNITY): Payer: Self-pay | Admitting: Cardiology

## 2023-11-12 ENCOUNTER — Encounter (HOSPITAL_COMMUNITY): Payer: 59 | Admitting: Cardiology

## 2023-11-12 VITALS — BP 146/84 | HR 68 | Wt 340.0 lb

## 2023-11-12 DIAGNOSIS — N183 Chronic kidney disease, stage 3 unspecified: Secondary | ICD-10-CM

## 2023-11-12 DIAGNOSIS — I5022 Chronic systolic (congestive) heart failure: Secondary | ICD-10-CM | POA: Diagnosis not present

## 2023-11-12 DIAGNOSIS — I3139 Other pericardial effusion (noninflammatory): Secondary | ICD-10-CM | POA: Diagnosis not present

## 2023-11-12 DIAGNOSIS — I255 Ischemic cardiomyopathy: Secondary | ICD-10-CM

## 2023-11-12 DIAGNOSIS — I2510 Atherosclerotic heart disease of native coronary artery without angina pectoris: Secondary | ICD-10-CM | POA: Diagnosis not present

## 2023-11-12 LAB — COMPREHENSIVE METABOLIC PANEL
ALT: 34 U/L (ref 0–44)
AST: 33 U/L (ref 15–41)
Albumin: 2.9 g/dL — ABNORMAL LOW (ref 3.5–5.0)
Alkaline Phosphatase: 277 U/L — ABNORMAL HIGH (ref 38–126)
Anion gap: 7 (ref 5–15)
BUN: 11 mg/dL (ref 8–23)
CO2: 27 mmol/L (ref 22–32)
Calcium: 7.8 mg/dL — ABNORMAL LOW (ref 8.9–10.3)
Chloride: 110 mmol/L (ref 98–111)
Creatinine, Ser: 1.14 mg/dL — ABNORMAL HIGH (ref 0.44–1.00)
GFR, Estimated: 53 mL/min — ABNORMAL LOW (ref >60)
Glucose, Bld: 222 mg/dL — ABNORMAL HIGH (ref 70–99)
Potassium: 4.3 mmol/L (ref 3.5–5.1)
Sodium: 144 mmol/L (ref 135–145)
Total Bilirubin: 0.9 mg/dL (ref <1.2)
Total Protein: 7.3 g/dL (ref 6.5–8.1)

## 2023-11-12 LAB — IRON AND TIBC
Iron: 32 ug/dL (ref 28–170)
Saturation Ratios: 8 % — ABNORMAL LOW (ref 10.4–31.8)
TIBC: 426 ug/dL (ref 250–450)
UIBC: 394 ug/dL

## 2023-11-12 LAB — BRAIN NATRIURETIC PEPTIDE: B Natriuretic Peptide: 105.4 pg/mL — ABNORMAL HIGH (ref 0.0–100.0)

## 2023-11-12 LAB — FERRITIN: Ferritin: 11 ng/mL (ref 11–307)

## 2023-11-12 MED ORDER — ENTRESTO 49-51 MG PO TABS: 1.0000 | ORAL_TABLET | Freq: Two times a day (BID) | ORAL | 3 refills | Status: DC

## 2023-11-12 MED ORDER — FUROSEMIDE 40 MG PO TABS: 60.0000 mg | ORAL_TABLET | Freq: Every day | ORAL | 1 refills | Status: DC

## 2023-11-12 MED ORDER — FUROSEMIDE 40 MG PO TABS: 80.0000 mg | ORAL_TABLET | Freq: Every day | ORAL | 1 refills | Status: DC

## 2023-11-12 NOTE — Progress Notes (Signed)
ADVANCED HEART FAILURE CLINIC NOTE  Referring Physician: Sharlene Dory, NP  Primary Care: Toma Deiters, MD Primary Cardiologist: Dr. Dina Rich  HPI: Maureen Jackson is a 67 y.o. 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   Her history of heart failure dates back to at least 2018.  Echocardiogram at that time with EF of 30 to 35% with left heart cath demonstrating severe multivessel coronary artery disease.  Patient underwent PCI as opposed to CABG with DES to the LAD, x 2 to the RCA and left circumflex disease managed medically.  In 2021 EF dropped to 30% with recovery to 40 to 50% in April 2023.  Patient underwent unsuccessful pericardiocentesis for a large effusion in January 2024 eventually requiring creation of a pericardial window.  Today she reports that she is doing fairly well however is very easily fatigued and becomes frequently edematous.  She is very inactive and struggles with obesity.  Reports that she becomes tired and is unable to exercise.  This is compounded further by lower extremity pain/arthritis.  She has minimal PND or bendopnea.  Compliant with medications.   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 multivessel CAD by cath in 09/2017 and not felt to be a CABG candidate --> underwent two-vessel PCI with DES to the LAD and DES to the RCA   Diabetes mellitus    Diabetes mellitus, type II (HCC)    Dyspnea    Glaucoma    Hypertension    Left bundle branch block    Morbid obesity (HCC)    Obstructive sleep apnea    does not wear CPAP   Thyroid disease     Current Outpatient Medications  Medication Sig Dispense Refill   acetaminophen (TYLENOL) 325  MG tablet Take 2 tablets (650 mg total) by mouth every 4 (four) hours as needed for mild pain, fever or headache. 12 tablet 0   atorvastatin (LIPITOR) 80 MG tablet TAKE 1 TABLET(80 MG) BY MOUTH DAILY (Patient taking differently: Take 80 mg by mouth daily.) 30 tablet 6   Blood Pressure Monitoring (OMRON 3 SERIES BP MONITOR) DEVI Use as directed 1 each 1   calcium carbonate (TUMS - DOSED IN MG ELEMENTAL CALCIUM) 500 MG chewable tablet Chew 4 tablets by mouth every evening.     cetirizine (ZYRTEC) 10 MG tablet Take 10 mg by mouth daily as needed for allergies.   0   clopidogrel (PLAVIX) 75 MG tablet TAKE 1 TABLET(75 MG) BY MOUTH DAILY 90 tablet 0   COMBIGAN 0.2-0.5 % ophthalmic solution Place 1 drop into both eyes 2 (two) times daily.     diclofenac Sodium (VOLTAREN) 1 % GEL Apply 2 g topically daily as needed (arthritis).     glucose blood (ONETOUCH VERIO) test strip Test glucose 4 times day. 150 each 2   insulin aspart (NOVOLOG) 100 UNIT/ML injection Inject 0-15 Units into the skin 3 (three) times daily with meals.     JARDIANCE 25 MG TABS tablet Take 25 mg by mouth daily.     levothyroxine (SYNTHROID) 200 MCG tablet Take 200 mcg by mouth daily before breakfast.  Take 1 tablet with for a total dose of daily     levothyroxine (SYNTHROID) 50 MCG tablet Take 50 mcg by mouth daily before breakfast. Take 1 tablet with for a total dose of daily     metoprolol succinate (TOPROL-XL) 25 MG 24 hr tablet TAKE 1 TABLET(25 MG) BY MOUTH DAILY 90 tablet 1   NOVOLOG FLEXPEN 100 UNIT/ML FlexPen Inject 15 Units into the skin 3 (three) times daily with meals.     OZEMPIC, 0.25 OR 0.5 MG/DOSE, 2 MG/3ML SOPN Inject 0.5 mg into the skin once a week.     sacubitril-valsartan (ENTRESTO) 49-51 MG Take 1 tablet by mouth 2 (two) times daily. 60 tablet 3   senna-docusate (SENOKOT-S) 8.6-50 MG tablet Take 2 tablets by mouth 2 (two) times daily. 60 tablet 3   spironolactone (ALDACTONE) 25 MG tablet TAKE  1 TABLET(25 MG) BY MOUTH DAILY 90 tablet 1   TRESIBA FLEXTOUCH 100 UNIT/ML FlexTouch Pen Inject 10 Units into the skin at bedtime.     VENTOLIN HFA 108 (90 Base) MCG/ACT inhaler Inhale 1-2 puffs into the lungs every 6 (six) hours as needed for wheezing or shortness of breath. 18 g 1   furosemide (LASIX) 40 MG tablet Take 1.5 tablets (60 mg total) by mouth daily. TAKE TWO TABLETS (80MG ) ONCE DAILY FOR 6 DAYS- THEN RETURN TO NORMAL DOSING OF 1.5 TABLETS (60MG ) DAILY 60 tablet 1   No current facility-administered medications for this encounter.    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, he passed"  Tobacco Use   Smoking status: Former    Current packs/day: 0.00    Types: Cigarettes    Quit date: 12/14/2005    Years since quitting: 17.9    Passive exposure: Never   Smokeless tobacco: Never   Tobacco comments:    QUIT IN 2005  Vaping Use   Vaping status: Never Used  Substance and Sexual Activity   Alcohol use: No   Drug use: No   Sexual activity: Never  Other Topics Concern   Not on file  Social History Narrative   Not on file   Social Determinants of Health   Financial Resource Strain: Low Risk  (12/22/2022)   Overall Financial Resource Strain (CARDIA)    Difficulty of Paying Living Expenses: Not very hard  Food Insecurity: No Food Insecurity (12/26/2022)   Hunger Vital Sign    Worried About Running Out of Food in the Last Year: Never true    Ran Out of Food in the Last Year: Never true   Transportation Needs: No Transportation Needs (12/22/2022)   PRAPARE - Administrator, Civil Service (Medical): No    Lack of Transportation (Non-Medical): No  Physical Activity: Not on file  Stress: Not on file  Social Connections: Not on file  Intimate Partner Violence: Not At Risk (12/26/2022)   Humiliation, Afraid, Rape, and Kick questionnaire  Fear of Current or Ex-Partner: No    Emotionally Abused: No    Physically Abused: No    Sexually Abused: No      Family History  Problem Relation Age of Onset   Diabetes Mother    Hypertension Mother    Cancer Mother        pancreas   Hypertension Sister     PHYSICAL EXAM: Vitals:   11/12/23 1130  BP: (!) 146/84  Pulse: 68  SpO2: 98%   GENERAL: morbidly obese HEENT: Negative for arcus senilis or xanthelasma. There is no scleral icterus.  The mucous membranes are pink and moist.   NECK: Supple, No masses. Normal carotid upstrokes without bruits. No masses or thyromegaly.    CHEST: There are no chest wall deformities. There is no chest wall tenderness. Respirations are unlabored.  Lungs-CTA bilaterally CARDIAC:  JVP: 10 cm H2O but difficult to visualize         Normal S1, S2  Normal rate with regular rhythm. No murmurs, rubs or gallops.  Pulses are 2+ and symmetrical in upper and lower extremities.  1-2+ edema.  ABDOMEN: Soft, non-tender, non-distended. There are no masses or hepatomegaly. There are normal bowel sounds.  EXTREMITIES: Warm and well perfused with no cyanosis, clubbing.  LYMPHATIC: No axillary or supraclavicular lymphadenopathy.  NEUROLOGIC: Patient is oriented x3 with no focal or lateralizing neurologic deficits.  PSYCH: Patients affect is appropriate, there is no evidence of anxiety or depression.  SKIN: Warm and dry; no lesions or wounds.   DATA REVIEW  ECG: 05/12/23: Normal sinus rhythm with left bundle branch block as per my personal interpretation  ECHO: 08/20/23: 30 to 35%, normal RV function  as per my personal interpretation  CATH: 09/11/23:  Severe multivessel CAD with 60% proximal LAD stenosis and 85% mid LAD stenosis; 80, 90 and 80%  OM1 stenosis of the left circumflex with 60% mid AV groove and 50% mid distal AV groove stenosis; dominant RCA with 30% proximal followed by 70% proximal stenosis, 90% mid stenosis, and diffuse mid distal PDA stenosis of at least 80%.   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  NYHA class / AHA Stage:NYHA III Volume status & Diuretics: lasix 60mg  once daily; will increase 80mg  x 5 days.  Vasodilators: Increase Entresto to 49/51mg  BID Beta-Blocker:hypervolemic; continue toprol 25mg  daily.  AVW:[id number] 25mg  daily Cardiometabolic:jardiance 25mg  daily Devices therapies & Valvulopathies:N/A Advanced therapies:Not a candidate due to obesity; will refer for cardiac rehab.    2. Pericardial effusion s/p window - Failed pericardiocentesis; s/p window in 12/2022 - Felt to be secondary to hypothyroidism.   3. CAD - Stable; no angina. Followed by general cardiology - continue lipitor, plavix and toprol XL.   4. CKD 3A-3B - continue jardiance 25mg  daily - repeat labs today.   5. Obesity - She has been on Ozempic  for 1 year - Body mass index is 56.58 kg/m. - Discussed importance of weight loss.  - Will enroll in cardiac rehab to start structured exercise program; discussed extensively today.   Monifah Freehling Advanced Heart Failure Mechanical Circulatory Support

## 2023-11-12 NOTE — Patient Instructions (Signed)
Medication Changes:  INCREASE ENTRESTO TO 49/51MG  TWICE DAILY   TAKE LASIX (FUROSEMIDE) TO 80MG  DAILY FOR 6 DAYS   Lab Work:  Labs done today, your results will be available in MyChart, we will contact you for abnormal readings.  THEN LABS AT NEXT APPOINTMENT   Referrals:  YOU HAVE BEEN REFERRED TO CARDIAC REHAB THEY WILL REACH OUT TO YOU OR CALL TO ARRANGE THIS. PLEASE CALL us WITH ANY CONCERNS   Follow-Up in: NEXT WEEK AS SCHEDULED   At the [facility], you and your health needs are our priority. We have a designated team specialized in the treatment of Heart Failure. This Care Team includes your primary Heart Failure Specialized Cardiologist (physician), Advanced Practice Providers (APPs- Physician Assistants and Nurse Practitioners), and Pharmacist who all work together to provide you with the care you need, when you need it.   You may see any of the following providers on your designated Care Team at your next follow up:  Dr. Arvilla Meres Dr. Marca Ancona Dr. Dorthula Nettles Dr. Theresia Bough Tonye Becket, NP Robbie Lis, [place] [facility] [place], [place] Brynda Peon, NP [place] Lee, NP Karle Plumber, PharmD   Please be sure to bring in all your medications bottles to every appointment.   Need to Contact us:  If you have any questions or concerns before your next appointment please send [place] a message through [place] or call our office at [phone].    TO LEAVE A MESSAGE FOR THE NURSE SELECT OPTION 2, PLEASE LEAVE A MESSAGE INCLUDING: YOUR NAME DATE OF BIRTH CALL BACK NUMBER REASON FOR CALL**this is important as we prioritize the call backs  YOU WILL RECEIVE A CALL BACK THE SAME DAY AS LONG AS YOU CALL BEFORE 4:00 PM

## 2023-11-19 NOTE — Progress Notes (Incomplete)
ADVANCED HEART FAILURE CLINIC NOTE  Referring Physician: Toma Deiters, MD  Primary Care: Toma Deiters, MD Primary Cardiologist: Dr. Dina Rich  HPI: Maureen Jackson is a 67 y.o. 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   Her history of heart failure dates back to at least 2018.  Echocardiogram at that time with EF of 30 to 35% with left heart cath demonstrating severe multivessel coronary artery disease.  Patient underwent PCI as opposed to CABG with DES to the LAD, x 2 to the RCA and left circumflex disease managed medically.  In 2021 EF dropped to 30% with recovery to 40 to 50% in April 2023.  Patient underwent unsuccessful pericardiocentesis for a large effusion in January 2024 eventually requiring creation of a pericardial window.  Today she reports that she is doing fairly well however is very easily fatigued and becomes frequently edematous.  She is very inactive and struggles with obesity.  Reports that she becomes tired and is unable to exercise.  This is compounded further by lower extremity pain/arthritis.  She has minimal PND or bendopnea.  Compliant with medications.   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 multivessel CAD by cath in 09/2017 and not felt to be a CABG candidate --> underwent two-vessel PCI with DES to the LAD and DES to the RCA   Diabetes mellitus    Diabetes mellitus, type II (HCC)    Dyspnea    Glaucoma    Hypertension    Left bundle branch block    Morbid obesity (HCC)    Obstructive sleep apnea    does not wear CPAP   Thyroid disease     Current Outpatient Medications  Medication Sig Dispense Refill   acetaminophen (TYLENOL) 325  MG tablet Take 2 tablets (650 mg total) by mouth every 4 (four) hours as needed for mild pain, fever or headache. 12 tablet 0   atorvastatin (LIPITOR) 80 MG tablet TAKE 1 TABLET(80 MG) BY MOUTH DAILY (Patient taking differently: Take 80 mg by mouth daily.) 30 tablet 6   Blood Pressure Monitoring (OMRON 3 SERIES BP MONITOR) DEVI Use as directed 1 each 1   calcium carbonate (TUMS - DOSED IN MG ELEMENTAL CALCIUM) 500 MG chewable tablet Chew 4 tablets by mouth every evening.     cetirizine (ZYRTEC) 10 MG tablet Take 10 mg by mouth daily as needed for allergies.   0   clopidogrel (PLAVIX) 75 MG tablet TAKE 1 TABLET(75 MG) BY MOUTH DAILY 90 tablet 0   COMBIGAN 0.2-0.5 % ophthalmic solution Place 1 drop into both eyes 2 (two) times daily.     diclofenac Sodium (VOLTAREN) 1 % GEL Apply 2 g topically daily as needed (arthritis).     furosemide (LASIX) 40 MG tablet Take 1.5 tablets (60 mg total) by mouth daily. TAKE TWO TABLETS (80MG ) ONCE DAILY FOR 6 DAYS- THEN RETURN TO NORMAL DOSING OF 1.5 TABLETS (60MG ) DAILY 60 tablet 1   glucose blood (ONETOUCH VERIO) test strip Test glucose 4 times day. 150 each 2   insulin aspart (NOVOLOG) 100 UNIT/ML injection Inject 0-15 Units into the  skin 3 (three) times daily with meals.     JARDIANCE 25 MG TABS tablet Take 25 mg by mouth daily.     levothyroxine (SYNTHROID) 200 MCG tablet Take 200 mcg by mouth daily before breakfast. Take 1 tablet with for a total dose of daily     levothyroxine (SYNTHROID) 50 MCG tablet Take 50 mcg by mouth daily before breakfast. Take 1 tablet with for a total dose of daily     metoprolol succinate (TOPROL-XL) 25 MG 24 hr tablet TAKE 1 TABLET(25 MG) BY MOUTH DAILY 90 tablet 1   NOVOLOG FLEXPEN 100 UNIT/ML FlexPen Inject 15 Units into the skin 3 (three) times daily with meals.     OZEMPIC, 0.25 OR 0.5 MG/DOSE, 2 MG/3ML SOPN Inject 0.5 mg into the skin once a week.     sacubitril-valsartan (ENTRESTO) 49-51 MG Take 1  tablet by mouth 2 (two) times daily. 60 tablet 3   senna-docusate (SENOKOT-S) 8.6-50 MG tablet Take 2 tablets by mouth 2 (two) times daily. 60 tablet 3   spironolactone (ALDACTONE) 25 MG tablet TAKE 1 TABLET(25 MG) BY MOUTH DAILY 90 tablet 1   TRESIBA FLEXTOUCH 100 UNIT/ML FlexTouch Pen Inject 10 Units into the skin at bedtime.     VENTOLIN HFA 108 (90 Base) MCG/ACT inhaler Inhale 1-2 puffs into the lungs every 6 (six) hours as needed for wheezing or shortness of breath. 18 g 1   No current facility-administered medications for this visit.    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, he passed"  Tobacco Use   Smoking status: Former    Current packs/day: 0.00    Types: Cigarettes    Quit date: 12/14/2005    Years since quitting: 17.9    Passive exposure: Never   Smokeless tobacco: Never   Tobacco comments:    QUIT IN 2005  Vaping Use   Vaping status: Never Used  Substance and Sexual Activity   Alcohol use: No   Drug use: No   Sexual activity: Never  Other Topics Concern   Not on file  Social History Narrative   Not on file   Social Determinants of Health   Financial Resource Strain: Low Risk  (12/22/2022)   Overall Financial Resource Strain (CARDIA)    Difficulty of Paying Living Expenses: Not very hard  Food Insecurity: No Food Insecurity (12/26/2022)   Hunger Vital Sign    Worried About Running Out of Food in the Last Year: Never true    Ran Out of Food in the Last Year: Never true   Transportation Needs: No Transportation Needs (12/22/2022)   PRAPARE - Administrator, Civil Service (Medical): No    Lack of Transportation (Non-Medical): No  Physical Activity: Not on file  Stress: Not on file  Social Connections: Not on file  Intimate Partner Violence: Not At Risk (12/26/2022)   Humiliation, Afraid, Rape, and Kick  questionnaire    Fear of Current or Ex-Partner: No    Emotionally Abused: No    Physically Abused: No    Sexually Abused: No      Family History  Problem Relation Age of Onset   Diabetes Mother    Hypertension Mother    Cancer Mother        pancreas   Hypertension Sister     PHYSICAL EXAM: There were no vitals filed for this visit. GENERAL: Well nourished, well developed, and in no apparent distress at rest.  HEENT: Negative for arcus senilis or xanthelasma. There is no scleral icterus.  The mucous membranes are pink and moist.   NECK: Supple, No masses. Normal carotid upstrokes without bruits. No masses or thyromegaly.    CHEST: There are no chest wall deformities. There is no chest wall tenderness. Respirations are unlabored.  Lungs- *** CARDIAC:  JVP: *** cm          Normal rate with regular rhythm. No murmurs, rubs or gallops.  Pulses are 2+ and symmetrical in upper and lower extremities. *** edema.  ABDOMEN: Soft, non-tender, non-distended. There are no masses or hepatomegaly. There are normal bowel sounds.  EXTREMITIES: Warm and well perfused with no cyanosis, clubbing.  LYMPHATIC: No axillary or supraclavicular lymphadenopathy.  NEUROLOGIC: Patient is oriented x3 with no focal or lateralizing neurologic deficits.  PSYCH: Patients affect is appropriate, there is no evidence of anxiety or depression.  SKIN: Warm and dry; no lesions or wounds.    DATA REVIEW  ECG: 05/12/23: Normal sinus rhythm with left bundle branch block as per my personal interpretation  ECHO: 08/20/23: 30 to 35%, normal RV function as per my personal  interpretation  CATH: 09/11/23:  Severe multivessel CAD with 60% proximal LAD stenosis and 85% mid LAD stenosis; 80, 90 and 80%  OM1 stenosis of the left circumflex with 60% mid AV groove and 50% mid distal AV groove stenosis; dominant RCA with 30% proximal followed by 70% proximal stenosis, 90% mid stenosis, and diffuse mid distal PDA stenosis of at least 80%.   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  NYHA class / AHA Stage:NYHA III Volume status & Diuretics: lasix 60mg  once daily; will increase 80mg  x 5 days.  Vasodilators: Increase Entresto to 49/51mg  BID Beta-Blocker:hypervolemic; continue toprol 25mg  daily.  AOZ:HYQMVHQIONGEXB 25mg  daily Cardiometabolic:jardiance 25mg  daily Devices therapies & Valvulopathies:N/A Advanced therapies:Not a candidate due to obesity; will refer for cardiac rehab.    2. Pericardial effusion s/p window - Failed pericardiocentesis; s/p window in 12/2022 - Felt to be secondary to hypothyroidism.   3. CAD - Stable; no angina. Followed by general cardiology - continue lipitor, plavix and toprol XL.   4. CKD 3A-3B - continue jardiance 25mg  daily - repeat labs today.   5. Obesity - She has been on Ozempic  for 1 year - There is no height or weight on file to calculate BMI. - Discussed importance of weight loss.  - Will enroll in cardiac rehab to start structured exercise program; discussed extensively today.   Tamel Abel Advanced Heart Failure Mechanical Circulatory Support

## 2023-11-20 ENCOUNTER — Ambulatory Visit (HOSPITAL_COMMUNITY)
Admission: RE | Admit: 2023-11-20 | Discharge: 2023-11-20 | Disposition: A | Discharge status: Home / Self Care | Payer: 59 | Source: Ambulatory Visit | Attending: Cardiology | Admitting: Cardiology

## 2023-11-20 ENCOUNTER — Encounter (HOSPITAL_COMMUNITY): Payer: Self-pay | Admitting: Cardiology

## 2023-11-20 VITALS — BP 168/84 | HR 87 | Wt 340.6 lb

## 2023-11-20 DIAGNOSIS — I5022 Chronic systolic (congestive) heart failure: Secondary | ICD-10-CM | POA: Insufficient documentation

## 2023-11-20 DIAGNOSIS — I255 Ischemic cardiomyopathy: Secondary | ICD-10-CM | POA: Diagnosis not present

## 2023-11-20 DIAGNOSIS — I428 Other cardiomyopathies: Secondary | ICD-10-CM | POA: Diagnosis not present

## 2023-11-20 DIAGNOSIS — E1151 Type 2 diabetes mellitus with diabetic peripheral angiopathy without gangrene: Secondary | ICD-10-CM | POA: Insufficient documentation

## 2023-11-20 DIAGNOSIS — E1122 Type 2 diabetes mellitus with diabetic chronic kidney disease: Secondary | ICD-10-CM | POA: Insufficient documentation

## 2023-11-20 DIAGNOSIS — I3139 Other pericardial effusion (noninflammatory): Secondary | ICD-10-CM | POA: Insufficient documentation

## 2023-11-20 DIAGNOSIS — Z833 Family history of diabetes mellitus: Secondary | ICD-10-CM | POA: Diagnosis not present

## 2023-11-20 DIAGNOSIS — Z79899 Other long term (current) drug therapy: Secondary | ICD-10-CM | POA: Insufficient documentation

## 2023-11-20 DIAGNOSIS — I130 Hypertensive heart and chronic kidney disease with heart failure and stage 1 through stage 4 chronic kidney disease, or unspecified chronic kidney disease: Secondary | ICD-10-CM | POA: Insufficient documentation

## 2023-11-20 DIAGNOSIS — Z955 Presence of coronary angioplasty implant and graft: Secondary | ICD-10-CM | POA: Insufficient documentation

## 2023-11-20 DIAGNOSIS — E039 Hypothyroidism, unspecified: Secondary | ICD-10-CM | POA: Insufficient documentation

## 2023-11-20 DIAGNOSIS — Z7902 Long term (current) use of antithrombotics/antiplatelets: Secondary | ICD-10-CM | POA: Diagnosis not present

## 2023-11-20 DIAGNOSIS — Z7985 Long-term (current) use of injectable non-insulin antidiabetic drugs: Secondary | ICD-10-CM | POA: Insufficient documentation

## 2023-11-20 DIAGNOSIS — Z794 Long term (current) use of insulin: Secondary | ICD-10-CM | POA: Insufficient documentation

## 2023-11-20 DIAGNOSIS — Z6841 Body Mass Index (BMI) 40.0 and over, adult: Secondary | ICD-10-CM | POA: Diagnosis not present

## 2023-11-20 DIAGNOSIS — N183 Chronic kidney disease, stage 3 unspecified: Secondary | ICD-10-CM | POA: Diagnosis not present

## 2023-11-20 DIAGNOSIS — I10 Essential (primary) hypertension: Secondary | ICD-10-CM

## 2023-11-20 DIAGNOSIS — R053 Chronic cough: Secondary | ICD-10-CM | POA: Diagnosis not present

## 2023-11-20 DIAGNOSIS — I2510 Atherosclerotic heart disease of native coronary artery without angina pectoris: Secondary | ICD-10-CM | POA: Insufficient documentation

## 2023-11-20 DIAGNOSIS — I5042 Chronic combined systolic (congestive) and diastolic (congestive) heart failure: Secondary | ICD-10-CM

## 2023-11-20 DIAGNOSIS — N1832 Chronic kidney disease, stage 3b: Secondary | ICD-10-CM | POA: Insufficient documentation

## 2023-11-20 DIAGNOSIS — Z8249 Family history of ischemic heart disease and other diseases of the circulatory system: Secondary | ICD-10-CM | POA: Diagnosis not present

## 2023-11-20 LAB — BASIC METABOLIC PANEL
Anion gap: 8 (ref 5–15)
BUN: 21 mg/dL (ref 8–23)
CO2: 23 mmol/L (ref 22–32)
Calcium: 8.2 mg/dL — ABNORMAL LOW (ref 8.9–10.3)
Chloride: 109 mmol/L (ref 98–111)
Creatinine, Ser: 1.43 mg/dL — ABNORMAL HIGH (ref 0.44–1.00)
GFR, Estimated: 41 mL/min — ABNORMAL LOW (ref >60)
Glucose, Bld: 245 mg/dL — ABNORMAL HIGH (ref 70–99)
Potassium: 4.6 mmol/L (ref 3.5–5.1)
Sodium: 140 mmol/L (ref 135–145)

## 2023-11-20 LAB — BRAIN NATRIURETIC PEPTIDE: B Natriuretic Peptide: 146.3 pg/mL — ABNORMAL HIGH (ref 0.0–100.0)

## 2023-11-20 MED ORDER — ENTRESTO 97-103 MG PO TABS: 1.0000 | ORAL_TABLET | Freq: Two times a day (BID) | ORAL | 6 refills | Status: DC

## 2023-11-20 NOTE — Patient Instructions (Signed)
Medication Changes:  INCREASE Furosemide to 80 mg (2 tabs) Daily 4-5 days then back to 40 mg Daily  INCREASE Entresto to 97/103 mg Twice daily   Lab Work:  Labs done today, your results will be available in MyChart, we will contact you for abnormal readings.   Testing/Procedures:  none  Referrals:  none  Special Instructions // Education:  Do the following things EVERYDAY: Weigh yourself in the morning before breakfast. Write it down and keep it in a log. Take your medicines as prescribed Eat low salt foods--Limit salt (sodium) to 2000 mg per day.  Stay as active as you can everyday Limit all fluids for the day to less than 2 liters   Follow-Up in: 3 weeks and again in 2 months   At the [facility], you and your health needs are our priority. We have a designated team specialized in the treatment of Heart Failure. This Care Team includes your primary Heart Failure Specialized Cardiologist (physician), Advanced Practice Providers (APPs- Physician Assistants and Nurse Practitioners), and Pharmacist who all work together to provide you with the care you need, when you need it.   You may see any of the following providers on your designated Care Team at your next follow up:  Dr. Arvilla Meres Dr. Marca Ancona Dr. Dorthula Nettles Dr. Theresia Bough Tonye Becket, NP Robbie Lis, [place] [facility] [place], [place] Brynda Peon, NP [place] Lee, NP Karle Plumber, PharmD   Please be sure to bring in all your medications bottles to every appointment.   Need to Contact us:  If you have any questions or concerns before your next appointment please send [place] a message through [place] or call our office at [phone].    TO LEAVE A MESSAGE FOR THE NURSE SELECT OPTION 2, PLEASE LEAVE A MESSAGE INCLUDING: YOUR NAME DATE OF BIRTH CALL BACK NUMBER REASON FOR CALL**this is important as we prioritize the call backs  YOU WILL RECEIVE A CALL BACK THE SAME  DAY AS LONG AS YOU CALL BEFORE 4:00 PM

## 2023-12-11 ENCOUNTER — Ambulatory Visit (HOSPITAL_COMMUNITY)
Admission: RE | Admit: 2023-12-11 | Discharge: 2023-12-11 | Disposition: A | Discharge status: Home / Self Care | Payer: 59 | Source: Ambulatory Visit | Attending: Adult Health | Admitting: Adult Health

## 2023-12-11 ENCOUNTER — Other Ambulatory Visit (HOSPITAL_COMMUNITY): Payer: Self-pay

## 2023-12-11 VITALS — BP 134/94 | HR 77 | Wt 343.4 lb

## 2023-12-11 DIAGNOSIS — I2510 Atherosclerotic heart disease of native coronary artery without angina pectoris: Secondary | ICD-10-CM | POA: Diagnosis not present

## 2023-12-11 DIAGNOSIS — N183 Chronic kidney disease, stage 3 unspecified: Secondary | ICD-10-CM

## 2023-12-11 DIAGNOSIS — Z955 Presence of coronary angioplasty implant and graft: Secondary | ICD-10-CM | POA: Insufficient documentation

## 2023-12-11 DIAGNOSIS — I130 Hypertensive heart and chronic kidney disease with heart failure and stage 1 through stage 4 chronic kidney disease, or unspecified chronic kidney disease: Secondary | ICD-10-CM | POA: Diagnosis not present

## 2023-12-11 DIAGNOSIS — I428 Other cardiomyopathies: Secondary | ICD-10-CM | POA: Diagnosis not present

## 2023-12-11 DIAGNOSIS — I10 Essential (primary) hypertension: Secondary | ICD-10-CM | POA: Diagnosis not present

## 2023-12-11 DIAGNOSIS — E1122 Type 2 diabetes mellitus with diabetic chronic kidney disease: Secondary | ICD-10-CM | POA: Diagnosis not present

## 2023-12-11 DIAGNOSIS — I3139 Other pericardial effusion (noninflammatory): Secondary | ICD-10-CM | POA: Diagnosis not present

## 2023-12-11 DIAGNOSIS — I5042 Chronic combined systolic (congestive) and diastolic (congestive) heart failure: Secondary | ICD-10-CM | POA: Diagnosis not present

## 2023-12-11 DIAGNOSIS — I255 Ischemic cardiomyopathy: Secondary | ICD-10-CM | POA: Insufficient documentation

## 2023-12-11 DIAGNOSIS — Z8249 Family history of ischemic heart disease and other diseases of the circulatory system: Secondary | ICD-10-CM | POA: Diagnosis not present

## 2023-12-11 DIAGNOSIS — N1832 Chronic kidney disease, stage 3b: Secondary | ICD-10-CM | POA: Diagnosis not present

## 2023-12-11 DIAGNOSIS — G4733 Obstructive sleep apnea (adult) (pediatric): Secondary | ICD-10-CM | POA: Diagnosis not present

## 2023-12-11 DIAGNOSIS — Z6841 Body Mass Index (BMI) 40.0 and over, adult: Secondary | ICD-10-CM | POA: Insufficient documentation

## 2023-12-11 DIAGNOSIS — Z833 Family history of diabetes mellitus: Secondary | ICD-10-CM | POA: Insufficient documentation

## 2023-12-11 DIAGNOSIS — I5022 Chronic systolic (congestive) heart failure: Secondary | ICD-10-CM | POA: Insufficient documentation

## 2023-12-11 DIAGNOSIS — Z79899 Other long term (current) drug therapy: Secondary | ICD-10-CM | POA: Diagnosis not present

## 2023-12-11 DIAGNOSIS — Z7902 Long term (current) use of antithrombotics/antiplatelets: Secondary | ICD-10-CM | POA: Diagnosis not present

## 2023-12-11 DIAGNOSIS — Z794 Long term (current) use of insulin: Secondary | ICD-10-CM | POA: Diagnosis not present

## 2023-12-11 DIAGNOSIS — Z7985 Long-term (current) use of injectable non-insulin antidiabetic drugs: Secondary | ICD-10-CM | POA: Diagnosis not present

## 2023-12-11 LAB — BASIC METABOLIC PANEL
Anion gap: 9 (ref 5–15)
BUN: 20 mg/dL (ref 8–23)
CO2: 23 mmol/L (ref 22–32)
Calcium: 7.8 mg/dL — ABNORMAL LOW (ref 8.9–10.3)
Chloride: 108 mmol/L (ref 98–111)
Creatinine, Ser: 1.31 mg/dL — ABNORMAL HIGH (ref 0.44–1.00)
GFR, Estimated: 45 mL/min — ABNORMAL LOW (ref >60)
Glucose, Bld: 236 mg/dL — ABNORMAL HIGH (ref 70–99)
Potassium: 3.9 mmol/L (ref 3.5–5.1)
Sodium: 140 mmol/L (ref 135–145)

## 2023-12-11 MED ORDER — FUROSEMIDE 40 MG PO TABS: 60.0000 mg | ORAL_TABLET | Freq: Every day | ORAL | 3 refills | Status: DC

## 2023-12-11 NOTE — Progress Notes (Signed)
 ADVANCED HEART FAILURE CLINIC NOTE  Referring Physician: Orpha Yancey LABOR, MD  Primary Care: Orpha Yancey LABOR, MD Primary Cardiologist: Dr. Dorn Ross HF MD: Dr Gardenia.   Chief Complaint: Heart Failure   HPI: Maureen Jackson is a 67 y.o. 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  Echocardiogram at that time with EF of 30 to 35% with left heart cath demonstrating severe multivessel coronary artery disease.  Patient underwent PCI as opposed to CABG with DES to the LAD, x 2 to the RCA and left circumflex disease managed medically.  In 2021 EF dropped to 30% with recovery to 40 to 50% in April 2023.  Patient underwent unsuccessful pericardiocentesis for a large effusion in January 2024 eventually requiring creation of a pericardial window.  Had Iron  panel 11/12/2023- Iron  sats 8% tron 32 Ferritin 11.  Followed closley in the HF clinic. She was last seen 11/20/23. Entresto  was increased 97-103 twice a day.   Today she returns for HF follow up with her daughter. Complaining of fatigue.  Eats lots of ice. SOB with exertion. Denies PND/Orthopnea. Appetite ok. No fever or chills. Weight at home has gone up after Christmas. She been eating Lays potato chips. Taking all medications.    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 multivessel CAD by cath in 09/2017 and not felt to be a CABG candidate --> underwent two-vessel PCI with DES to the LAD and DES to the RCA   Diabetes mellitus    Diabetes mellitus, type II (HCC)    Dyspnea    Glaucoma    Hypertension    Left bundle branch block    Morbid obesity (HCC)    Obstructive sleep apnea    does not wear CPAP    Thyroid  disease     Current Outpatient Medications  Medication Sig Dispense Refill   acetaminophen  (TYLENOL ) 325 MG tablet Take 2 tablets (650 mg total) by mouth every 4 (four) hours as needed for mild pain, fever or headache. 12 tablet 0   atorvastatin  (LIPITOR ) 80 MG tablet TAKE 1 TABLET(80 MG) BY MOUTH DAILY 30 tablet 6   Blood Pressure Monitoring (OMRON 3 SERIES BP MONITOR) DEVI Use as directed 1 each 1   calcium  carbonate (TUMS - DOSED IN MG ELEMENTAL CALCIUM ) 500 MG chewable tablet Chew 4 tablets by mouth every evening.     cetirizine  (ZYRTEC ) 10 MG tablet Take 10 mg by mouth daily as needed for allergies.   0   clopidogrel  (PLAVIX ) 75 MG tablet TAKE 1 TABLET(75 MG) BY MOUTH DAILY 90 tablet 0   COMBIGAN  0.2-0.5 % ophthalmic solution Place 1 drop into both eyes 2 (two) times daily.     furosemide  (LASIX ) 40 MG tablet Take 1.5 tablets (60 mg total) by mouth daily. TAKE TWO TABLETS (80MG ) ONCE DAILY FOR 6 DAYS- THEN RETURN TO NORMAL DOSING OF 1.5 TABLETS (60MG ) DAILY 60 tablet 1   glucose blood (ONETOUCH VERIO) test strip Test glucose 4 times day. 150 each 2   insulin  aspart (NOVOLOG ) 100 UNIT/ML injection Inject 0-15 Units into the  skin 3 (three) times daily with meals.     JARDIANCE  25 MG TABS tablet Take 25 mg by mouth daily.     levothyroxine  (SYNTHROID ) 200 MCG tablet Take 200 mcg by mouth daily before breakfast. Take 1 tablet with 50mcg for a total dose of 250mcg daily     levothyroxine  (SYNTHROID ) 50 MCG tablet Take 50 mcg by mouth daily before breakfast. Take 1 tablet with 200mcg for a total dose of 250mcg daily     metoprolol  succinate (TOPROL -XL) 25 MG 24 hr tablet TAKE 1 TABLET(25 MG) BY MOUTH DAILY 90 tablet 1   NOVOLOG  FLEXPEN 100 UNIT/ML FlexPen Inject 15 Units into the skin 3 (three) times daily with meals.     OZEMPIC, 0.25 OR 0.5 MG/DOSE, 2 MG/3ML SOPN Inject 0.5 mg into the skin once a week. Fridays     sacubitril -valsartan  (ENTRESTO ) 97-103 MG Take 1 tablet by mouth 2  (two) times daily. 60 tablet 6   senna-docusate (SENOKOT-S) 8.6-50 MG tablet Take 2 tablets by mouth 2 (two) times daily. 60 tablet 3   spironolactone  (ALDACTONE ) 25 MG tablet TAKE 1 TABLET(25 MG) BY MOUTH DAILY 90 tablet 1   TRESIBA  FLEXTOUCH 100 UNIT/ML FlexTouch Pen Inject 10 Units into the skin at bedtime.     VENTOLIN  HFA 108 (90 Base) MCG/ACT inhaler Inhale 1-2 puffs into the lungs every 6 (six) hours as needed for wheezing or shortness of breath. 18 g 1   No current facility-administered medications for this encounter.    Allergies  Allergen Reactions   Ampicillin  Other (See Comments)    Allergic, per MAR   Aspirin  Nausea Only   Hydrocodone  Other (See Comments)    Allergic, per MAR   Unasyn  [Ampicillin -Sulbactam Sodium ] Rash and Other (See Comments)    Allergic, per [facility]      Social History   Socioeconomic History   Marital status: Widowed    Spouse name: Not on file   Number of children: 1   Years of education: Not on file   Highest education level: 11th grade  Occupational History   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, he passed  Tobacco Use   Smoking status: Former    Current packs/day: 0.00    Types: Cigarettes    Quit date: 12/14/2005    Years since quitting: 18.0    Passive exposure: Never   Smokeless tobacco: Never   Tobacco comments:    QUIT IN 2005  Vaping Use   Vaping status: Never Used  Substance and Sexual Activity   Alcohol  use: No   Drug use: No   Sexual activity: Never  Other Topics Concern   Not on file  Social History Narrative   Not on file   Social Drivers of Health   Financial Resource Strain: Low Risk  (12/22/2022)   Overall Financial Resource Strain (CARDIA)    Difficulty of Paying Living Expenses: Not very hard  Food Insecurity: No Food Insecurity (12/26/2022)   Hunger Vital Sign    Worried About Running Out of Food in the Last Year: Never true    Ran Out of Food in the Last Year: Never true  Transportation Needs: No  Transportation Needs (12/22/2022)   PRAPARE - Administrator, Civil Service (Medical): No    Lack of Transportation (Non-Medical): No  Physical Activity: Not on file  Stress: Not on file  Social Connections: Not on file  Intimate Partner Violence: Not At Risk (12/26/2022)   Humiliation, Afraid, Rape, and  Kick questionnaire    Fear of Current or Ex-Partner: No    Emotionally Abused: No    Physically Abused: No    Sexually Abused: No      Family History  Problem Relation Age of Onset   Diabetes Mother    Hypertension Mother    Cancer Mother        pancreas   Hypertension Sister     PHYSICAL EXAM: Vitals:   12/11/23 1125  BP: (!) 134/94  Pulse: 77  SpO2: 98%   Wt Readings from Last 3 Encounters:  12/11/23 (!) 155.8 kg (343 lb 6.4 oz)  11/20/23 (!) 154.5 kg (340 lb 9.6 oz)  11/12/23 (!) 154.2 kg (340 lb)    General:  No  resp difficulty HEENT: normal Neck: supple. JVP 8-9. Carotids 2+ bilat; no bruits. No lymphadenopathy or thryomegaly appreciated. Cor: PMI nondisplaced. Regular rate & rhythm. No rubs, gallops or murmurs. Lungs: clear Abdomen: obese, soft, nontender, nondistended. No hepatosplenomegaly. No bruits or masses. Good bowel sounds. Extremities: no cyanosis, clubbing, rash, R and LLE trace edema Neuro: alert & orientedx3, cranial nerves grossly intact. moves all 4 extremities w/o difficulty. Affect pleasant  DATA REVIEW  ECG: 05/12/23: Normal sinus rhythm with left bundle branch block as per my personal interpretation  ECHO: 08/20/23: 30 to 35%, normal RV function as per my personal interpretation  CATH: 09/11/23:  Severe multivessel CAD with 60% proximal LAD stenosis and 85% mid LAD stenosis; 80, 90 and 80%  OM1 stenosis of the left circumflex with 60% mid AV groove and 50% mid distal AV groove stenosis; dominant RCA with 30% proximal followed by 70% proximal stenosis, 90% mid stenosis, and diffuse mid distal PDA stenosis of at least  80%.   ASSESSMENT & PLAN:  Heart failure with reduced EF Echo 08/2023 EF 30-35% RV normal.  Etiology of YQ:fpkzi ischemic and nonischemic cardiomyopathy.  NYHA class / AHA Stage:NYHA III Volume status & Diuretics: Volume status elevated. Suspect in the setting of high sodium diet. Increase lasix  80 mg x days then back to 60 mg daily.  Vasodilators: Continue  Entresto  to 97/103mg  BID Beta-Blocker:Continue toprol  25mg  daily.  MRA:spironolactone  25mg  daily Cardiometabolic:jardiance  25mg  daily Devices therapies & Valvulopathies:N/A Advanced therapies:Not a candidate due to obesity.   Iron  sats 8%. Per ACC/AHA 2024 guidelines will set up for IV Iron .  -Check BMET  - Will need to repeat ECHO set up once HF meds optimized.   2. Pericardial effusion s/p window - Failed pericardiocentesis; s/p window in 12/2022 - Felt to be secondary to hypothyroidism.   3. CAD - continue lipitor , plavix  and toprol  XL.  -No chest pain.   4. CKD 3A-3B - continue jardiance  25mg  daily - Check BMET today.   5. Obesity - She has been on Ozempic  for 1 year - Body mass index is 57.14 kg/m. - Discussed portion control and low salt food choices.   6 Hypertension Follow. Discussed diet and medications    Follow  up in 6 weeks with Dr Gardenia.   Greig Mosses NP-C  3:14 PM

## 2023-12-11 NOTE — Patient Instructions (Signed)
 TAKE 80 mg lasix  daily for 4 days, then go back to 60 mg daily after that.  Labs done today, your results will be available in MyChart, we will contact you for abnormal readings.  You will be called to have your iron  infusion scheduled.  Your physician recommends that you schedule a follow-up appointment in: 6 weeks.  If you have any questions or concerns before your next appointment please send us  a message through [place] or call our office at [phone].    TO LEAVE A MESSAGE FOR THE NURSE SELECT OPTION 2, PLEASE LEAVE A MESSAGE INCLUDING: YOUR NAME DATE OF BIRTH CALL BACK NUMBER REASON FOR CALL**this is important as we prioritize the call backs  YOU WILL RECEIVE A CALL BACK THE SAME DAY AS LONG AS YOU CALL BEFORE 4:00 PM  At the [facility], you and your health needs are our priority. As part of our continuing mission to provide you with exceptional heart care, we have created designated Provider Care Teams. These Care Teams include your primary Cardiologist (physician) and Advanced Practice Providers (APPs- Physician Assistants and Nurse Practitioners) who all work together to provide you with the care you need, when you need it.   You may see any of the following providers on your designated Care Team at your next follow up: Dr Toribio Fuel Dr Ezra Shuck Dr. Ria Commander Dr. Morene Brownie Amy Lenetta, NP Caffie Shed, [place] [facility], [place] Beckey Coe, NP Jordan Lee, NP Tinnie Redman, PharmD   Please be sure to bring in all your medications bottles to every appointment.    Thank you for choosing [facility]

## 2023-12-18 ENCOUNTER — Ambulatory Visit: Payer: 59 | Admitting: Nurse Practitioner

## 2023-12-28 ENCOUNTER — Encounter (HOSPITAL_COMMUNITY): Payer: 59

## 2024-01-03 ENCOUNTER — Inpatient Hospital Stay (HOSPITAL_COMMUNITY): Admission: RE | Admit: 2024-01-03 | Payer: 59 | Source: Ambulatory Visit

## 2024-01-10 ENCOUNTER — Telehealth: Payer: Self-pay | Admitting: Cardiology

## 2024-01-10 MED ORDER — CLOPIDOGREL BISULFATE 75 MG PO TABS: ORAL_TABLET | ORAL | 2 refills | Status: DC

## 2024-01-10 NOTE — Telephone Encounter (Signed)
*  STAT* If patient is at the pharmacy, call can be transferred to refill team.   1. Which medications need to be refilled? (please list name of each medication and dose if known) clopidogrel (PLAVIX) 75 MG tablet   2. Which pharmacy/location (including street and city if local pharmacy) is medication to be sent to? [id number] - [place], [place] - [address] [phone]   3. Do they need a 30 day or 90 day supply? 90

## 2024-01-10 NOTE — Telephone Encounter (Signed)
Refill sent.

## 2024-01-13 ENCOUNTER — Other Ambulatory Visit: Payer: Self-pay

## 2024-01-13 ENCOUNTER — Emergency Department (HOSPITAL_COMMUNITY)
Admission: EM | Admit: 2024-01-13 | Discharge: 2024-01-16 | Disposition: A | Discharge status: Home / Self Care | Payer: 59

## 2024-01-13 ENCOUNTER — Emergency Department (HOSPITAL_COMMUNITY): Payer: 59

## 2024-01-13 ENCOUNTER — Encounter (HOSPITAL_COMMUNITY): Payer: Self-pay | Admitting: Emergency Medicine

## 2024-01-13 DIAGNOSIS — S8992XA Unspecified injury of left lower leg, initial encounter: Secondary | ICD-10-CM | POA: Diagnosis not present

## 2024-01-13 DIAGNOSIS — M1712 Unilateral primary osteoarthritis, left knee: Secondary | ICD-10-CM | POA: Diagnosis not present

## 2024-01-13 DIAGNOSIS — I2510 Atherosclerotic heart disease of native coronary artery without angina pectoris: Secondary | ICD-10-CM | POA: Diagnosis not present

## 2024-01-13 DIAGNOSIS — Z794 Long term (current) use of insulin: Secondary | ICD-10-CM | POA: Diagnosis not present

## 2024-01-13 DIAGNOSIS — W19XXXA Unspecified fall, initial encounter: Secondary | ICD-10-CM | POA: Diagnosis not present

## 2024-01-13 DIAGNOSIS — Z7984 Long term (current) use of oral hypoglycemic drugs: Secondary | ICD-10-CM | POA: Insufficient documentation

## 2024-01-13 DIAGNOSIS — M25562 Pain in left knee: Secondary | ICD-10-CM | POA: Insufficient documentation

## 2024-01-13 DIAGNOSIS — E039 Hypothyroidism, unspecified: Secondary | ICD-10-CM | POA: Diagnosis not present

## 2024-01-13 DIAGNOSIS — R739 Hyperglycemia, unspecified: Secondary | ICD-10-CM | POA: Diagnosis not present

## 2024-01-13 DIAGNOSIS — E119 Type 2 diabetes mellitus without complications: Secondary | ICD-10-CM | POA: Insufficient documentation

## 2024-01-13 DIAGNOSIS — M25569 Pain in unspecified knee: Secondary | ICD-10-CM | POA: Diagnosis not present

## 2024-01-13 DIAGNOSIS — M25462 Effusion, left knee: Secondary | ICD-10-CM | POA: Diagnosis not present

## 2024-01-13 DIAGNOSIS — Z743 Need for continuous supervision: Secondary | ICD-10-CM | POA: Diagnosis not present

## 2024-01-13 DIAGNOSIS — Z7902 Long term (current) use of antithrombotics/antiplatelets: Secondary | ICD-10-CM | POA: Diagnosis not present

## 2024-01-13 LAB — BASIC METABOLIC PANEL
Anion gap: 10 (ref 5–15)
BUN: 21 mg/dL (ref 8–23)
CO2: 27 mmol/L (ref 22–32)
Calcium: 8 mg/dL — ABNORMAL LOW (ref 8.9–10.3)
Chloride: 103 mmol/L (ref 98–111)
Creatinine, Ser: 1.53 mg/dL — ABNORMAL HIGH (ref 0.44–1.00)
GFR, Estimated: 38 mL/min — ABNORMAL LOW (ref >60)
Glucose, Bld: 236 mg/dL — ABNORMAL HIGH (ref 70–99)
Potassium: 3.8 mmol/L (ref 3.5–5.1)
Sodium: 140 mmol/L (ref 135–145)

## 2024-01-13 LAB — CBC
HCT: 36.1 % (ref 36.0–46.0)
Hemoglobin: 10.6 g/dL — ABNORMAL LOW (ref 12.0–15.0)
MCH: 24.8 pg — ABNORMAL LOW (ref 26.0–34.0)
MCHC: 29.4 g/dL — ABNORMAL LOW (ref 30.0–36.0)
MCV: 84.5 fL (ref 80.0–100.0)
Platelets: 228 10*3/uL (ref 150–400)
RBC: 4.27 MIL/uL (ref 3.87–5.11)
RDW: 16.3 % — ABNORMAL HIGH (ref 11.5–15.5)
WBC: 6.2 10*3/uL (ref 4.0–10.5)
nRBC: 0 % (ref 0.0–0.2)

## 2024-01-13 MED ORDER — FUROSEMIDE 40 MG PO TABS
60.0000 mg | ORAL_TABLET | Freq: Every day | ORAL | Status: DC
  Administered 2024-01-13 – 2024-01-15 (×3): 60 mg via ORAL
  Filled 2024-01-13 (×3): qty 2

## 2024-01-13 MED ORDER — OXYCODONE-ACETAMINOPHEN 5-325 MG PO TABS
1.0000 | ORAL_TABLET | Freq: Once | ORAL | Status: AC
  Administered 2024-01-13: 1 via ORAL
  Filled 2024-01-13: qty 1

## 2024-01-13 MED ORDER — LEVOTHYROXINE SODIUM 50 MCG PO TABS
200.0000 ug | ORAL_TABLET | Freq: Every day | ORAL | Status: DC
  Administered 2024-01-14: 200 ug via ORAL
  Filled 2024-01-13: qty 4

## 2024-01-13 MED ORDER — METOPROLOL SUCCINATE ER 25 MG PO TB24
25.0000 mg | ORAL_TABLET | Freq: Every day | ORAL | Status: DC
  Administered 2024-01-13 – 2024-01-15 (×3): 25 mg via ORAL
  Filled 2024-01-13 (×3): qty 1

## 2024-01-13 MED ORDER — CLOPIDOGREL BISULFATE 75 MG PO TABS
75.0000 mg | ORAL_TABLET | Freq: Every day | ORAL | Status: DC
  Administered 2024-01-13 – 2024-01-15 (×3): 75 mg via ORAL
  Filled 2024-01-13 (×3): qty 1

## 2024-01-13 MED ORDER — DICLOFENAC SODIUM 1 % EX GEL
2.0000 g | Freq: Four times a day (QID) | CUTANEOUS | Status: DC
  Administered 2024-01-13 – 2024-01-15 (×9): 2 g via TOPICAL
  Filled 2024-01-13: qty 100

## 2024-01-13 MED ORDER — OXYCODONE-ACETAMINOPHEN 5-325 MG PO TABS
1.0000 | ORAL_TABLET | Freq: Four times a day (QID) | ORAL | Status: DC | PRN
  Administered 2024-01-14 – 2024-01-15 (×3): 1 via ORAL
  Filled 2024-01-13 (×3): qty 1

## 2024-01-13 MED ORDER — ATORVASTATIN CALCIUM 40 MG PO TABS
80.0000 mg | ORAL_TABLET | Freq: Every day | ORAL | Status: DC
  Administered 2024-01-13 – 2024-01-15 (×3): 80 mg via ORAL
  Filled 2024-01-13 (×3): qty 2

## 2024-01-13 NOTE — Progress Notes (Signed)
TOC aware of consult for possible SNF. PT evaluation ordered and pending at this time. TOC will follow up after PT evaluation completed.

## 2024-01-13 NOTE — ED Notes (Signed)
Linen changed under patient. Patient changed into gown. Purewick, tubing, and suction canister replaced

## 2024-01-13 NOTE — ED Provider Notes (Signed)
[place] EMERGENCY DEPARTMENT AT [facility] Provider Note   CSN: [id number] Arrival date & time: 01/13/24  4098     History  Chief Complaint  Patient presents with   Fall   Knee Pain    Maureen Jackson is a 68 y.o. female.  68 year old female with past medical history of diabetes, hypothyroidism, and coronary artery disease presenting to the emergency department today with left knee pain.  Patient states that she fell when she was trying to sit down on the toilet this morning.  She states that she went down to her left knee.  She did not hit her head or lose consciousness.  She denies any pain in her ankle or hip.  She states she is having pain mostly over her patella on the left.  She denies any other injuries.  This was a mechanical fall.  Denies any lightheadedness.   Fall  Knee Pain      Home Medications Prior to Admission medications   Medication Sig Start Date End Date Taking? Authorizing Provider  acetaminophen (TYLENOL) 325 MG tablet Take 2 tablets (650 mg total) by mouth every 4 (four) hours as needed for mild pain, fever or headache. 05/25/20  Yes Emokpae, Courage, MD  atorvastatin (LIPITOR) 80 MG tablet TAKE 1 TABLET(80 MG) BY MOUTH DAILY 04/23/23  Yes Branch, Dorothe Pea, MD  calcium carbonate (TUMS - DOSED IN MG ELEMENTAL CALCIUM) 500 MG chewable tablet Chew 4 tablets by mouth every evening.   Yes [provider]  cetirizine (ZYRTEC) 10 MG tablet Take 10 mg by mouth daily as needed for allergies.  09/12/18  Yes [provider]  clopidogrel (PLAVIX) 75 MG tablet TAKE 1 TABLET(75 MG) BY MOUTH DAILY 01/10/24  Yes Branch, Dorothe Pea, MD  COMBIGAN 0.2-0.5 % ophthalmic solution Place 1 drop into both eyes 2 (two) times daily. 02/08/22  Yes [provider]  furosemide (LASIX) 40 MG tablet Take 1.5 tablets (60 mg total) by mouth daily. 12/11/23  Yes Clegg, Amy D, NP  insulin aspart (NOVOLOG) 100 UNIT/ML injection Inject 0-15 Units into the  skin 3 (three) times daily with meals. 01/02/23  Yes Tyrone Nine, MD  JARDIANCE 25 MG TABS tablet Take 25 mg by mouth daily. 08/02/22  Yes [provider]  levothyroxine (SYNTHROID) 200 MCG tablet Take 200 mcg by mouth daily before breakfast. Take 1 tablet with for a total dose of daily   Yes [provider]  levothyroxine (SYNTHROID) 50 MCG tablet Take 50 mcg by mouth daily before breakfast. Take 1 tablet with for a total dose of daily   Yes [provider]  metoprolol succinate (TOPROL-XL) 25 MG 24 hr tablet TAKE 1 TABLET(25 MG) BY MOUTH DAILY 08/07/23  Yes Sharlene Dory, NP  NOVOLOG FLEXPEN 100 UNIT/ML FlexPen Inject 15 Units into the skin 3 (three) times daily with meals. 05/03/23  Yes [provider]  oxyCODONE (ROXICODONE) 5 MG immediate release tablet Take 1 tablet (5 mg total) by mouth every 6 (six) hours as needed for severe pain (pain score 7-10). 01/15/24  Yes Rondel Baton, MD  OZEMPIC, 0.25 OR 0.5 MG/DOSE, 2 MG/3ML SOPN Inject 0.5 mg into the skin once a week. Fridays 05/03/23  Yes [provider]  sacubitril-valsartan (ENTRESTO) 97-103 MG Take 1 tablet by mouth 2 (two) times daily. 11/20/23  Yes Sabharwal, Aditya, DO  TRESIBA FLEXTOUCH 100 UNIT/ML FlexTouch Pen Inject 10 Units into the skin at bedtime. 01/02/23  Yes Tyrone Nine, MD  VENTOLIN HFA 108 (90 Base) MCG/ACT inhaler Inhale 1-2 puffs into the lungs every 6 (six) hours as needed for wheezing or shortness of breath. 05/25/20  Yes Shon Hale, MD  Blood Pressure Monitoring (OMRON 3 SERIES BP MONITOR) DEVI Use as directed 08/27/23   Sharlene Dory, NP  glucose blood (ONETOUCH VERIO) test strip Test glucose 4 times day. 04/19/22   Roma Kayser, MD  senna-docusate (SENOKOT-S) 8.6-50 MG tablet Take 2 tablets by mouth 2 (two) times daily. 05/25/20   Shon Hale, MD  spironolactone (ALDACTONE) 25 MG tablet TAKE 1 TABLET(25 MG) BY MOUTH DAILY Patient  not taking: Reported on 01/14/2024 09/03/23   Sharlene Dory, NP      Allergies    Ampicillin, Aspirin, Hydrocodone, and Unasyn [ampicillin-sulbactam sodium]    Review of Systems   Review of Systems  Musculoskeletal:        Left knee pain  All other systems reviewed and are negative.   Physical Exam Updated Vital Signs BP 126/70 (BP Location: Right Arm)   Pulse 72   Temp 97.8 F (36.6 C) (Oral)   Resp 18   Ht 5\' 5"  (1.651 m)   Wt (!) 154.2 kg   SpO2 99%   BMI 56.58 kg/m  Physical Exam Vitals and nursing note reviewed.   Gen: NAD Eyes: PERRL, EOMI HEENT: no oropharyngeal swelling Neck: trachea midline Resp: clear to auscultation bilaterally Card: RRR, no murmurs, rubs, or gallops Abd: nontender, nondistended Extremities: no calf tenderness, no edema MSK: The patient is tender over the patella on the left with no obvious swelling or effusion noted, the remainder of the extremities are atraumatic.  There is limited range of motion due to pain Vascular: 2+ radial pulses bilaterally, 2+ DP pulses bilaterally Neuro: Equal strength sensation throughout bilateral lower extremities Skin: no rashes Psyc: acting appropriately   ED Results / Procedures / Treatments   Labs (all labs ordered are listed, but only abnormal results are displayed) Labs Reviewed  CBC - Abnormal; Notable for the following components:      Result Value   Hemoglobin 10.6 (*)    MCH 24.8 (*)    MCHC 29.4 (*)    RDW 16.3 (*)    All other components within normal limits  BASIC METABOLIC PANEL - Abnormal; Notable for the following components:   Glucose, Bld 236 (*)    Creatinine, Ser 1.53 (*)    Calcium 8.0 (*)    GFR, Estimated 38 (*)    All other components within normal limits    EKG None  Radiology No results found.  Procedures Procedures    Medications Ordered in ED Medications  oxyCODONE-acetaminophen (PERCOCET/ROXICET) 5-325 MG per tablet 1 tablet (1 tablet Oral Given 01/13/24 0852)   oxyCODONE-acetaminophen (PERCOCET/ROXICET) 5-325 MG per tablet 1 tablet (1 tablet Oral Given 01/13/24 1326)  oxyCODONE-acetaminophen (PERCOCET/ROXICET) 5-325 MG per tablet 1 tablet (1 tablet Oral Given 01/13/24 1555)  iron sucrose (VENOFER) 200 mg in sodium chloride 0.9 % 100 mL IVPB (0 mg Intravenous Stopped 01/14/24 1620)  acetaminophen (TYLENOL) tablet 650 mg (650 mg Oral Given 01/14/24 1444)  loratadine (CLARITIN) tablet 10 mg (10 mg Oral Given 01/14/24 1444)    ED Course/ Medical Decision Making/ A&P                                 Medical Decision Making 68 year old female with past medical history  of diabetes, hypothyroidism, and coronary artery disease presents the emergency department today with left knee pain.  Says he would be an isolated injury.  I will further evaluate her here with an x-ray to evaluate for acute fracture or dislocation.  Will give her Percocet for pain and reevaluate for ultimate disposition.  Imaging is negative including CT scan.  Patient still unable to ambulate.  She will be kept for PT/OT evaluation  Amount and/or Complexity of Data Reviewed Labs: ordered. Radiology: ordered.  Risk Prescription drug management.           Final Clinical Impression(s) / ED Diagnoses Final diagnoses:  Fall, initial encounter  Acute pain of left knee    Rx / DC Orders ED Discharge Orders          Ordered    Home Health        01/15/24 1532    Face-to-face encounter (required for Medicare/Medicaid patients)       Comments: I Rondel Baton certify that this patient is under my care and that I, or a nurse practitioner or physician's assistant working with me, had a face-to-face encounter that meets the physician face-to-face encounter requirements with this patient on 01/15/2024. The encounter with the patient was in whole, or in part for the following medical condition(s) which is the primary reason for home health care (List medical condition): knee injury with  difficulty ambulating   01/15/24 1532    oxyCODONE (ROXICODONE) 5 MG immediate release tablet  Every 6 hours PRN        01/15/24 1537              Durwin Glaze, MD 01/25/24 1505

## 2024-01-13 NOTE — ED Notes (Addendum)
Two techs in room to ambulate patient after knee immobilizer placed, with a two person max assist patient could not sit up or stand, providing no help stating pain in knee is too much to handle. MD and RN notified.

## 2024-01-13 NOTE — ED Notes (Signed)
Pt chose to hold 2nd order of pain medication until the 1st one wears off.

## 2024-01-13 NOTE — ED Notes (Signed)
Requested bariatric bed for patient comfort.

## 2024-01-13 NOTE — ED Triage Notes (Signed)
Pt reports when she went to sit on her commode her feet wen out from under her and she went down on her knee. She is complaining of left knee. Pt reports pain of 10/10. Pt denies hitting head or LOC

## 2024-01-13 NOTE — ED Notes (Signed)
Pt placed on female purwick. Indications are inability to roll, presentation from a fall, inability to bear weight on left knee, and recommended weight capacity of fracture pan.

## 2024-01-14 ENCOUNTER — Encounter (INDEPENDENT_AMBULATORY_CARE_PROVIDER_SITE_OTHER): Payer: Self-pay | Admitting: *Deleted

## 2024-01-14 ENCOUNTER — Inpatient Hospital Stay (HOSPITAL_COMMUNITY): Admission: RE | Admit: 2024-01-14 | Payer: 59 | Source: Ambulatory Visit

## 2024-01-14 IMAGING — DX DG CHEST 2V
2 series · 2 of 2 positions shown · non-contrast
Comparison: December 11, 2020.

CLINICAL DATA: Shortness of breath.

EXAM:
CHEST - 2 VIEW

[chest pa]
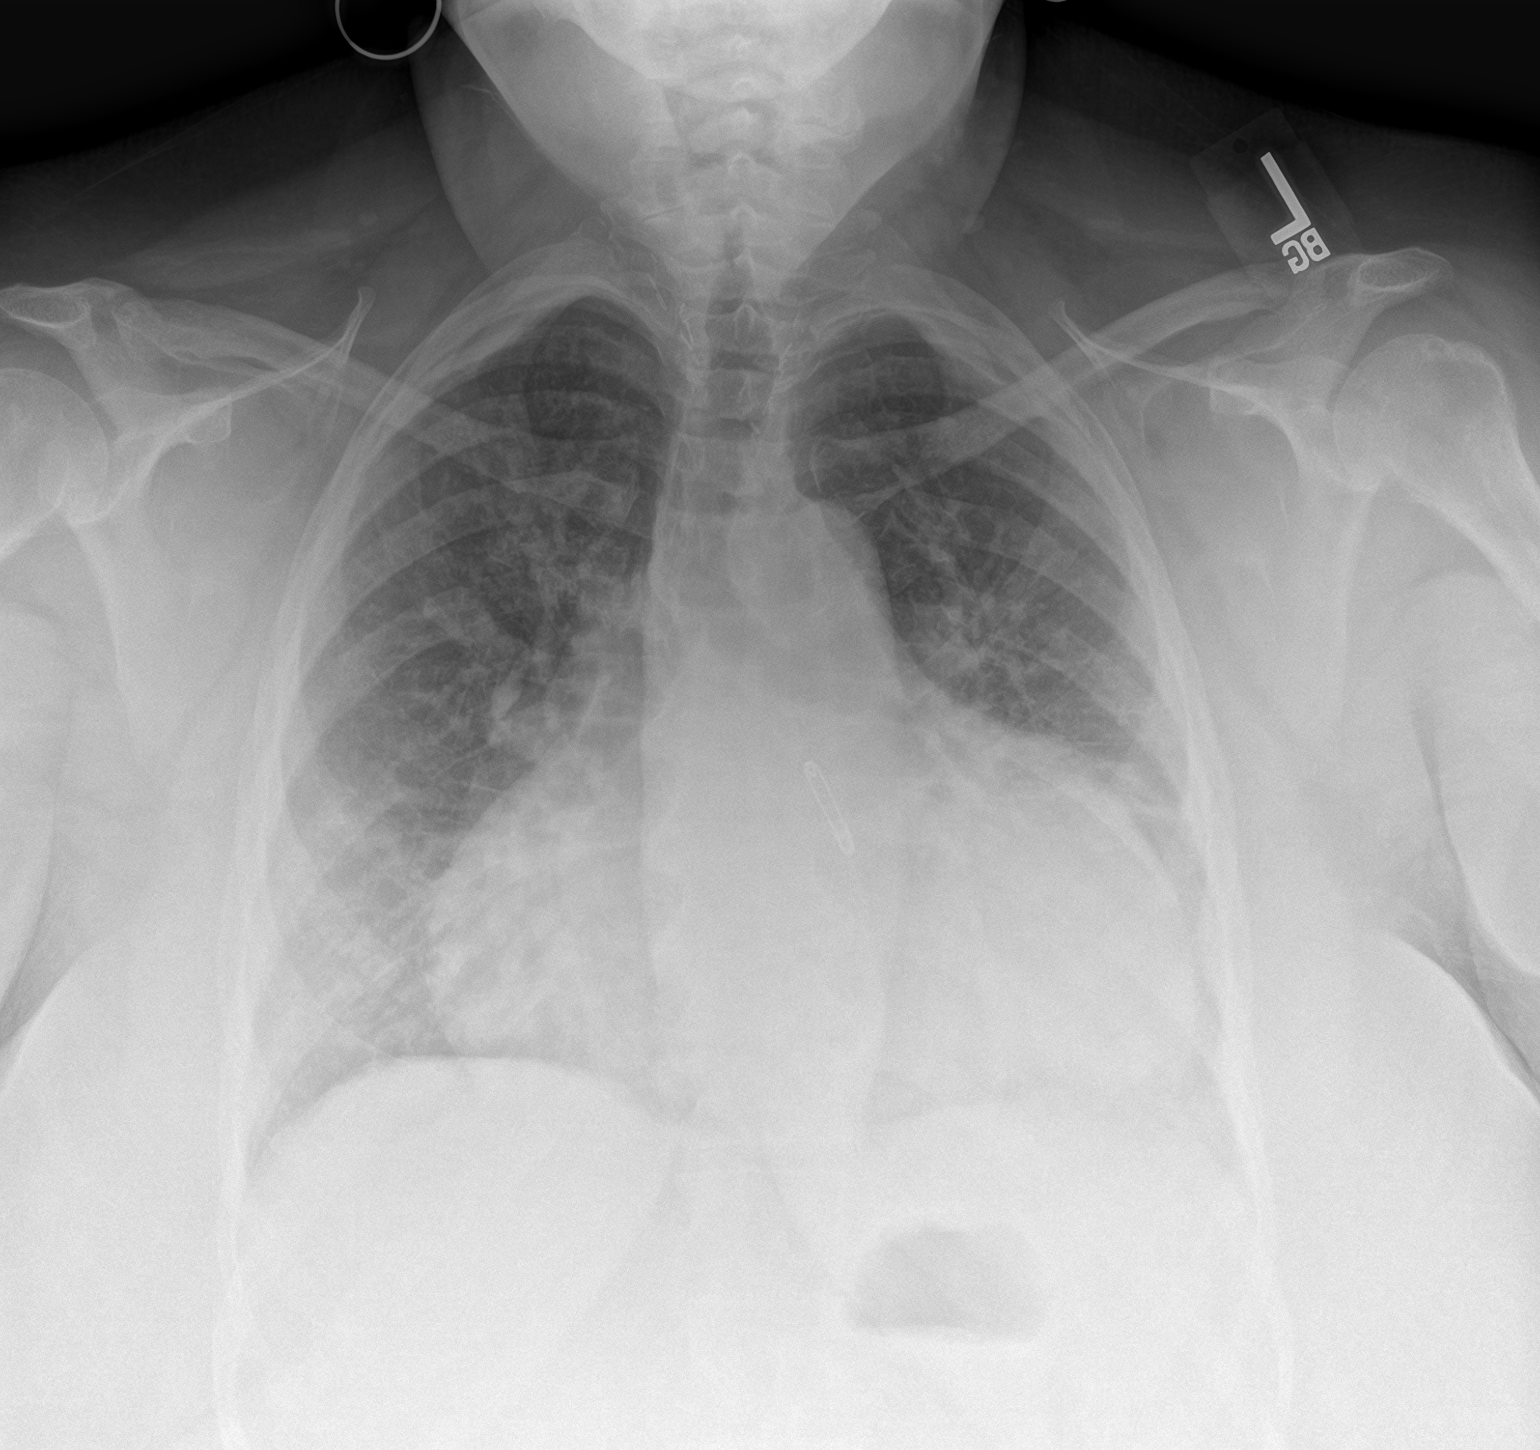

[chest lat]
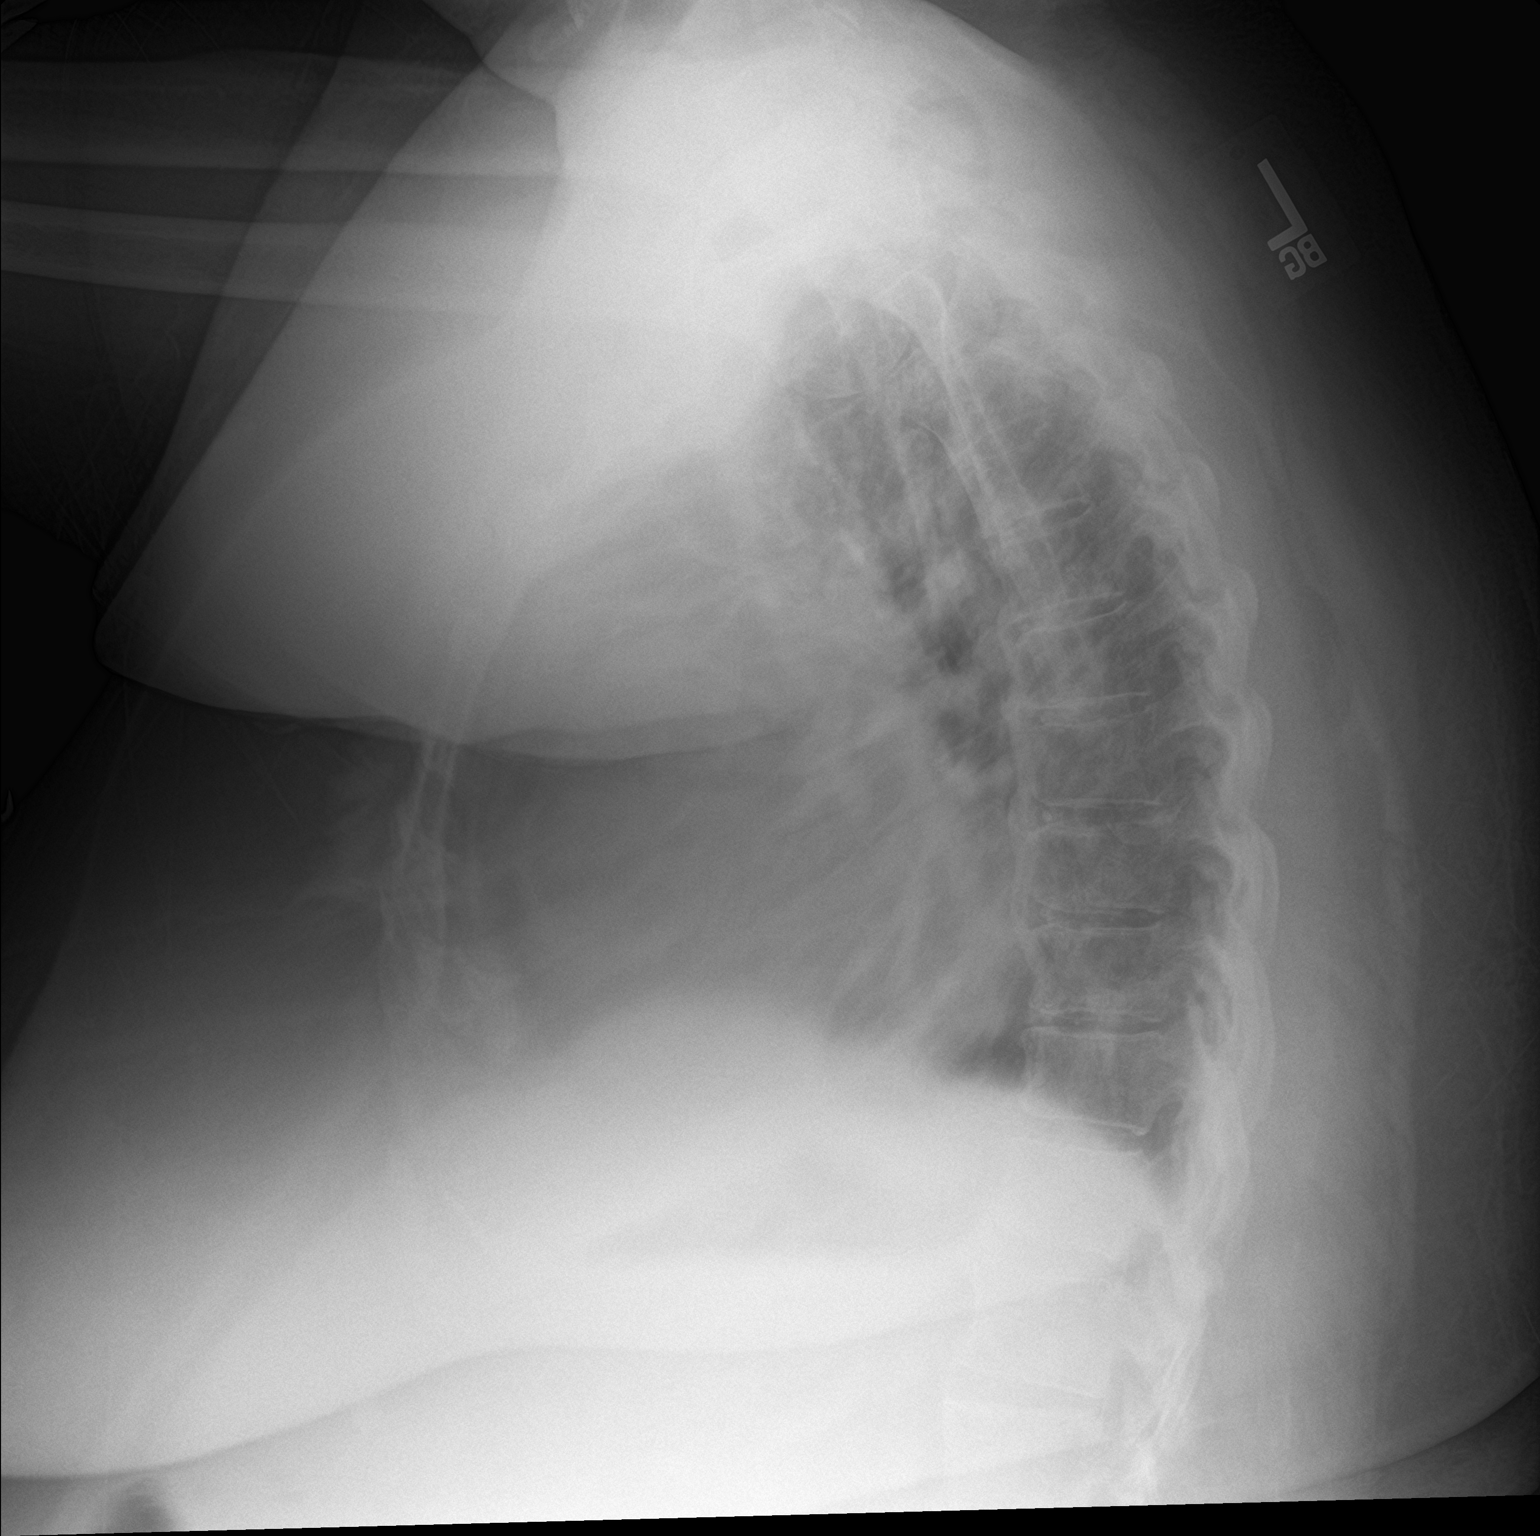

[2 of 2 positions shown; findings below may reference images not displayed]

FINDINGS: Stable cardiomegaly. Mild central pulmonary vascular congestion is
noted. Possible mild bibasilar pulmonary edema is noted. Bony thorax
is unremarkable.
IMPRESSION: Stable cardiomegaly with mild central pulmonary vascular congestion
and possible bibasilar pulmonary edema.

## 2024-01-14 MED ORDER — SODIUM CHLORIDE 0.9 % IV SOLN
200.0000 mg | Freq: Once | INTRAVENOUS | Status: AC
  Administered 2024-01-14: 200 mg via INTRAVENOUS
  Filled 2024-01-14: qty 10

## 2024-01-14 MED ORDER — ACETAMINOPHEN 325 MG PO TABS
650.0000 mg | ORAL_TABLET | Freq: Once | ORAL | Status: AC
  Administered 2024-01-14: 650 mg via ORAL
  Filled 2024-01-14: qty 2

## 2024-01-14 MED ORDER — LEVOTHYROXINE SODIUM 50 MCG PO TABS
250.0000 ug | ORAL_TABLET | Freq: Every day | ORAL | Status: DC
  Administered 2024-01-15 – 2024-01-16 (×2): 250 ug via ORAL
  Filled 2024-01-14 (×2): qty 5

## 2024-01-14 MED ORDER — LORATADINE 10 MG PO TABS
10.0000 mg | ORAL_TABLET | Freq: Once | ORAL | Status: AC
  Administered 2024-01-14: 10 mg via ORAL
  Filled 2024-01-14: qty 1

## 2024-01-14 MED ORDER — IRON SUCROSE 200 MG IVPB - SIMPLE MED
200.0000 mg | Freq: Once | Status: DC
  Filled 2024-01-14: qty 110

## 2024-01-14 NOTE — Plan of Care (Signed)
  Problem: Acute Rehab PT Goals(only PT should resolve) Goal: Pt Will Go Supine/Side To Sit Outcome: Progressing Flowsheets (Taken 01/14/2024 1228) Pt will go Supine/Side to Sit:  with minimal assist  with contact guard assist Goal: Patient Will Transfer Sit To/From Stand Outcome: Progressing Flowsheets (Taken 01/14/2024 1228) Patient will transfer sit to/from stand: with minimal assist Goal: Pt Will Transfer Bed To Chair/Chair To Bed Outcome: Progressing Flowsheets (Taken 01/14/2024 1228) Pt will Transfer Bed to Chair/Chair to Bed: with min assist Goal: Pt Will Ambulate Outcome: Progressing Flowsheets (Taken 01/14/2024 1228) Pt will Ambulate:  25 feet  with minimal assist  with rolling walker   12:28 PM, 01/14/24 Maureen Jackson, MPT Physical Therapist with [facility] [phone] office [phone] mobile phone

## 2024-01-14 NOTE — Evaluation (Signed)
Physical Therapy Evaluation Patient Details Name: Maureen Jackson MRN: [id number] DOB: [birth year] Today's Date: 01/14/2024  History of Present Illness  Maureen Jackson is a 67 y/o female reports when she went to sit on her commode her feet wen out from under her and she went down on her knee. She is complaining of left knee. Pt reports pain of 10/10. Pt denies hitting head or LOC   Clinical Impression  Patient demonstrates slow labored movement for sitting up at bedside, once seated able to flex left knee with only minor increase in pain, limited to a few steps forward/backwards before having to sit due to c/o fatigue and increasing left knee pain when standing.  Patient tolerated sitting up in chair after therapy. Patient will benefit from continued skilled physical therapy in hospital and recommended venue below to increase strength, balance, endurance for safe ADLs and gait.           If plan is discharge home, recommend the following: A lot of help with walking and/or transfers;A lot of help with bathing/dressing/bathroom;Assistance with cooking/housework;Help with stairs or ramp for entrance   Can travel by private vehicle   No    Equipment Recommendations None recommended by PT  Recommendations for Other Services       Functional Status Assessment Patient has had a recent decline in their functional status and demonstrates the ability to make significant improvements in function in a reasonable and predictable amount of time.     Precautions / Restrictions Precautions Precautions: Fall Restrictions Weight Bearing Restrictions Per Provider Order: No      Mobility  Bed Mobility Overal bed mobility: Needs Assistance Bed Mobility: Supine to Sit     Supine to sit: Min assist, Mod assist     General bed mobility comments: inceased time, labored movement, slightly increased left knee pain    Transfers Overall transfer level: Needs assistance Equipment used: Rolling  walker (2 wheels) Transfers: Sit to/from Stand, Bed to chair/wheelchair/BSC Sit to Stand: Min assist, Mod assist   Step pivot transfers: Mod assist       General transfer comment: slow labored movement with fair/good tolerance for weightbearing on LLE wihtout knee immobilizer    Ambulation/Gait Ambulation/Gait assistance: Mod assist Gait Distance (Feet): 10 Feet Assistive device: Rolling walker (2 wheels) Gait Pattern/deviations: Decreased step length - right, Decreased step length - left, Decreased stance time - left, Decreased stride length, Antalgic Gait velocity: slow     General Gait Details: limited to a few steps forward/backwards before having to sit due to fatigue and left knee pain  Stairs            Wheelchair Mobility     Tilt Bed    Modified Rankin (Stroke Patients Only)       Balance Overall balance assessment: Needs assistance Sitting-balance support: Feet supported, No upper extremity supported Sitting balance-Leahy Scale: Fair Sitting balance - Comments: fair/good seated at EOB   Standing balance support: During functional activity, Bilateral upper extremity supported Standing balance-Leahy Scale: Poor Standing balance comment: fair/poor using RW                             Pertinent Vitals/Pain Pain Assessment Pain Assessment: Faces Faces Pain Scale: Hurts little more Pain Location: left knee Pain Descriptors / Indicators: Sore, Grimacing Pain Intervention(s): Limited activity within patient's tolerance, Monitored during session, Premedicated before session, Repositioned    Home Living Family/patient expects to be  discharged to:: Private residence Living Arrangements: Alone Available Help at Discharge: Personal care attendant Type of Home: Apartment Home Access: Level entry       Home Layout: One level Home Equipment: Grab bars - toilet;Grab bars - tub/shower      Prior Function Prior Level of Function : Needs  assist       Physical Assist : Mobility (physical);ADLs (physical) Mobility (physical): Bed mobility;Transfers;Gait;Stairs   Mobility Comments: household ambulation without AD ADLs Comments: home aides 3 days/week     Extremity/Trunk Assessment   Upper Extremity Assessment Upper Extremity Assessment: Generalized weakness    Lower Extremity Assessment Lower Extremity Assessment: Generalized weakness;LLE deficits/detail LLE Deficits / Details: grossly -4/5 LLE: Unable to fully assess due to pain LLE Sensation: WNL LLE Coordination: WNL    Cervical / Trunk Assessment Cervical / Trunk Assessment: Normal  Communication   Communication Communication: No apparent difficulties  Cognition Arousal: Alert Behavior During Therapy: WFL for tasks assessed/performed Overall Cognitive Status: Within Functional Limits for tasks assessed                                          General Comments      Exercises     Assessment/Plan    PT Assessment Patient needs continued PT services  PT Problem List Decreased strength;Decreased activity tolerance;Decreased balance;Decreased mobility       PT Treatment Interventions DME instruction;Gait training;Stair training;Functional mobility training;Therapeutic activities;Therapeutic exercise;Balance training;Patient/family education    PT Goals (Current goals can be found in the Care Plan section)  Acute Rehab PT Goals Patient Stated Goal: return home after rehab PT Goal Formulation: With patient Time For Goal Achievement: 01/28/24 Potential to Achieve Goals: Good    Frequency Min 2X/week     Co-evaluation               AM-PAC PT "6 Clicks" Mobility  Outcome Measure Help needed turning from your back to your side while in a flat bed without using bedrails?: A Lot Help needed moving from lying on your back to sitting on the side of a flat bed without using bedrails?: A Lot Help needed moving to and from a  bed to a chair (including a wheelchair)?: A Lot Help needed standing up from a chair using your arms (e.g., wheelchair or bedside chair)?: A Lot Help needed to walk in hospital room?: A Lot Help needed climbing 3-5 steps with a railing? : Total 6 Click Score: 11    End of Session   Activity Tolerance: Patient tolerated treatment well;Patient limited by fatigue;Patient limited by pain Patient left: in chair;with call bell/phone within reach Nurse Communication: Mobility status PT Visit Diagnosis: Unsteadiness on feet (R26.81);Other abnormalities of gait and mobility (R26.89);Muscle weakness (generalized) (M62.81);Pain Pain - Right/Left: Left Pain - part of body: Knee    Time: 6644-0347 PT Time Calculation (min) (ACUTE ONLY): 23 min   Charges:   PT Evaluation $PT Eval Moderate Complexity: 1 Mod PT Treatments $Therapeutic Activity: 23-37 mins PT General Charges $$ ACUTE PT VISIT: 1 Visit         12:26 PM, 01/14/24 Ocie Bob, MPT Physical Therapist with [facility] [phone] office [phone] mobile phone

## 2024-01-14 NOTE — ED Notes (Signed)
Transition of Care [facility]) - Emergency Department Mini Assessment   Patient Details  Name: Maureen Jackson MRN: [id number] Date of Birth: 02-Jan-1956  [facility] CM/SW Contact:    Beather Arbour Phone Number: 01/14/2024, 2:59 PM   Clinical Narrative: CSW spoke with pt regarding SNF preferences. Patient top choice is [facility]. Pt is agreeable to CSW sending out her referral to [place] , but she would need to speak with daughter to make sure she can drive there. TOC will continue to follow.    ED Mini Assessment: What brought you to the Emergency Department? : Fall  Barriers to Discharge: ED Bed availability, ED SNF auth  Barrier interventions: Send pt referral out to designated facilities local     Interventions which prevented an admission or readmission: SNF Placement    Patient Contact and Communications Key Contact 1: Mechele Collin   Spoke with: Patient Contact Date: 01/14/24,   Contact time: 1455   Call outcome: SNF placement  Patient states their goals for this hospitalization and ongoing recovery are:: DC to a SNF CMS Medicare.gov Compare Post Acute Care list provided to:: Patient Choice offered to / list presented to : Patient  Admission diagnosis:  Fall Patient Active Problem List   Diagnosis Date Noted   Cardiac tamponade 12/21/2022   Acute on chronic combined systolic and diastolic CHF (congestive heart failure) (HCC) 12/20/2022   Acute on chronic combined systolic (congestive) and diastolic (congestive) heart failure (HCC) 05/18/2020   Acute respiratory disease due to COVID-19 virus 12/26/2019   Pressure injury of skin 09/21/2019   Drug rash    Abscess    Fistula    Fournier's gangrene in female [facility])    Chest pain 07/16/2018   Mixed hyperlipidemia 04/10/2018   TIA (transient ischemic attack) 03/14/2018   Vertigo 03/14/2018   Chronic combined systolic and diastolic CHF (congestive heart failure) (EF 30 to 35 %)  03/14/2018   Ischemic cardiomyopathy 11/06/2017   Coronary artery disease involving native coronary artery of native heart without angina pectoris    Pericardial effusion 09/07/2017   Acute combined systolic (congestive) and diastolic (congestive) heart failure (HCC) 09/06/2017   Cardiomegaly 09/05/2017   Morbid obesity/BMI > 55 03/04/2013   Labyrinthitis 03/04/2013   DM type 2 causing vascular disease (HCC) 03/04/2013   GASTRITIS 12/13/2006   HIATAL HERNIA, HX OF 12/13/2006   THYROIDECTOMY, HX OF 12/13/2006   Hypothyroidism 10/04/2006   TOBACCO ABUSE 10/04/2006   CARPAL TUNNEL SYNDROME 10/04/2006   Essential hypertension, benign 10/04/2006   GERD 10/04/2006   POSTMENOPAUSAL STATUS 10/04/2006   SKIN TAG 10/04/2006   KNEE PAIN, LEFT 10/04/2006   Sleep apnea 10/04/2006   LEG EDEMA 10/04/2006   PCP:  Toma Deiters, MD Pharmacy:   [facility] [phone] - [place], [place] - [address] [place] [address] Phone: [phone] Fax: [phone]

## 2024-01-14 NOTE — ED Provider Notes (Addendum)
Emergency Medicine Observation Re-evaluation Note  Maureen Jackson is a 68 y.o. female, seen on rounds today.  Pt initially presented to the ED for complaints of Fall and Knee Pain Currently, the patient is awake and alert.  She is complaining of knee pain.  Pt came in yesterday for a fall with knee pain.  CT just showed hematoma, but no fx.  She is unable to ambulate, so she was kept as a boarder.  Physical Exam  BP 129/73   Pulse 63   Temp 97.6 F (36.4 C)   Resp 17   Ht 5\' 5"  (1.651 m)   Wt (!) 154.2 kg   SpO2 96%   BMI 56.58 kg/m  Physical Exam General: awake and alert Cardiac: rr Lungs: clear Psych: calm  ED Course / MDM  EKG:   I have reviewed the labs performed to date as well as medications administered while in observation.  Recent changes in the last 24 hours include ED eval.  Plan  Current plan is for PT eval, SW to place in rehab.  Pt does not want to go to [facility].  She said she's been there before and they did not help her much.    Jacalyn Lefevre, MD 01/14/24 0730  Pt was scheduled for an iron transfusion today.  She had to cancel it because she was here, so she asked if we could give it.  IV iron ordered and will be given.   Jacalyn Lefevre, MD 01/14/24 1438

## 2024-01-14 NOTE — [place] FL2 (Signed)
[place] MEDICAID FL2 LEVEL OF CARE FORM     IDENTIFICATION  Patient Name: Maureen Jackson Birthdate: 1956-02-11 Sex: female Admission Date (Current Location): 01/13/2024  [facility] and [place] Number:  [employer] and Address:  [facility],  [address], [place] [address]      Provider Number: [id number]  Attending Physician Name and Address:  Chong Sicilian MD  Relative Name and Phone Number:  Aury Scollard (daughter) [phone]    Current Level of Care: Hospital Recommended Level of Care: Skilled Nursing Facility Prior Approval Number:    Date Approved/Denied:   PASRR Number: [id number]  Discharge Plan: SNF    Current Diagnoses: Patient Active Problem List   Diagnosis Date Noted   Cardiac tamponade 12/21/2022   Acute on chronic combined systolic and diastolic CHF (congestive heart failure) (HCC) 12/20/2022   Acute on chronic combined systolic (congestive) and diastolic (congestive) heart failure (HCC) 05/18/2020   Acute respiratory disease due to COVID-19 virus 12/26/2019   Pressure injury of skin 09/21/2019   Drug rash    Abscess    Fistula    Fournier's gangrene in female [facility])    Chest pain 07/16/2018   Mixed hyperlipidemia 04/10/2018   TIA (transient ischemic attack) 03/14/2018   Vertigo 03/14/2018   Chronic combined systolic and diastolic CHF (congestive heart failure) (EF 30 to 35 %) 03/14/2018   Ischemic cardiomyopathy 11/06/2017   Coronary artery disease involving native coronary artery of native heart without angina pectoris    Pericardial effusion 09/07/2017   Acute combined systolic (congestive) and diastolic (congestive) heart failure (HCC) 09/06/2017   Cardiomegaly 09/05/2017   Morbid obesity/BMI > 55 03/04/2013   Labyrinthitis 03/04/2013   DM type 2 causing vascular disease (HCC) 03/04/2013   GASTRITIS 12/13/2006   HIATAL HERNIA, HX OF 12/13/2006   THYROIDECTOMY, HX OF 12/13/2006   Hypothyroidism 10/04/2006    TOBACCO ABUSE 10/04/2006   CARPAL TUNNEL SYNDROME 10/04/2006   Essential hypertension, benign 10/04/2006   GERD 10/04/2006   POSTMENOPAUSAL STATUS 10/04/2006   SKIN TAG 10/04/2006   KNEE PAIN, LEFT 10/04/2006   Sleep apnea 10/04/2006   LEG EDEMA 10/04/2006    Orientation RESPIRATION BLADDER Height & Weight     Self, Time, Situation, Place  Normal External catheter Weight: (!) 340 lb (154.2 kg) Height:  5\' 5"  (165.1 cm)  BEHAVIORAL SYMPTOMS/MOOD NEUROLOGICAL BOWEL NUTRITION STATUS      Continent Diet (See AVS)  AMBULATORY STATUS COMMUNICATION OF NEEDS Skin   Extensive Assist Verbally Normal                       Personal Care Assistance Level of Assistance  Bathing, Feeding, Dressing Bathing Assistance: Maximum assistance Feeding assistance: Independent Dressing Assistance: Maximum assistance     Functional Limitations Info  Sight, Hearing, Speech Sight Info: Impaired (wears glasses) Hearing Info: Adequate Speech Info: Adequate    SPECIAL CARE FACTORS FREQUENCY  PT (By licensed PT), OT (By licensed OT)     PT Frequency: 5 x a week OT Frequency: 5 x a week            Contractures Contractures Info: Not present    Additional Factors Info  Code Status, Allergies Code Status Info: Full Allergies Info: Ampicillin, Aspirin, Hydrocodone, Unasyn (ampicillin-sulbactam sodium)           Current Medications (01/14/2024):  This is the current hospital active medication list Current Facility-Administered Medications  Medication Dose Route Frequency Provider Last  Rate Last Admin   atorvastatin (LIPITOR) tablet 80 mg  80 mg Oral Daily Durwin Glaze, MD   80 mg at 01/14/24 0940   clopidogrel (PLAVIX) tablet 75 mg  75 mg Oral Daily Durwin Glaze, MD   75 mg at 01/14/24 3664   diclofenac Sodium (VOLTAREN) 1 % topical gel 2 g  2 g Topical QID Durwin Glaze, MD   2 g at 01/14/24 1444   furosemide (LASIX) tablet 60 mg  60 mg Oral Daily Durwin Glaze, MD   60 mg at 01/14/24  0940   iron sucrose (VENOFER) 200 mg in sodium chloride 0.9 % 100 mL IVPB  200 mg Intravenous Once Jacalyn Lefevre, MD       Melene Muller ON 01/15/2024] levothyroxine (SYNTHROID) tablet 250 mcg  250 mcg Oral Q0600 Jacalyn Lefevre, MD       metoprolol succinate (TOPROL-XL) 24 hr tablet 25 mg  25 mg Oral Daily Durwin Glaze, MD   25 mg at 01/14/24 0940   oxyCODONE-acetaminophen (PERCOCET/ROXICET) 5-325 MG per tablet 1-2 tablet  1-2 tablet Oral Q6H PRN Durwin Glaze, MD   1 tablet at 01/14/24 4034   Current Outpatient Medications  Medication Sig Dispense Refill   acetaminophen (TYLENOL) 325 MG tablet Take 2 tablets (650 mg total) by mouth every 4 (four) hours as needed for mild pain, fever or headache. 12 tablet 0   atorvastatin (LIPITOR) 80 MG tablet TAKE 1 TABLET(80 MG) BY MOUTH DAILY 30 tablet 6   calcium carbonate (TUMS - DOSED IN MG ELEMENTAL CALCIUM) 500 MG chewable tablet Chew 4 tablets by mouth every evening.     cetirizine (ZYRTEC) 10 MG tablet Take 10 mg by mouth daily as needed for allergies.   0   clopidogrel (PLAVIX) 75 MG tablet TAKE 1 TABLET(75 MG) BY MOUTH DAILY 90 tablet 2   COMBIGAN 0.2-0.5 % ophthalmic solution Place 1 drop into both eyes 2 (two) times daily.     furosemide (LASIX) 40 MG tablet Take 1.5 tablets (60 mg total) by mouth daily. 90 tablet 3   insulin aspart (NOVOLOG) 100 UNIT/ML injection Inject 0-15 Units into the skin 3 (three) times daily with meals.     JARDIANCE 25 MG TABS tablet Take 25 mg by mouth daily.     levothyroxine (SYNTHROID) 200 MCG tablet Take 200 mcg by mouth daily before breakfast. Take 1 tablet with for a total dose of daily     levothyroxine (SYNTHROID) 50 MCG tablet Take 50 mcg by mouth daily before breakfast. Take 1 tablet with for a total dose of daily     metoprolol succinate (TOPROL-XL) 25 MG 24 hr tablet TAKE 1 TABLET(25 MG) BY MOUTH DAILY 90 tablet 1   NOVOLOG FLEXPEN 100 UNIT/ML FlexPen Inject 15 Units into the skin 3  (three) times daily with meals.     OZEMPIC, 0.25 OR 0.5 MG/DOSE, 2 MG/3ML SOPN Inject 0.5 mg into the skin once a week. Fridays     sacubitril-valsartan (ENTRESTO) 97-103 MG Take 1 tablet by mouth 2 (two) times daily. 60 tablet 6   TRESIBA FLEXTOUCH 100 UNIT/ML FlexTouch Pen Inject 10 Units into the skin at bedtime.     VENTOLIN HFA 108 (90 Base) MCG/ACT inhaler Inhale 1-2 puffs into the lungs every 6 (six) hours as needed for wheezing or shortness of breath. 18 g 1   Blood Pressure Monitoring (OMRON 3 SERIES BP MONITOR) DEVI Use as directed 1 each 1  glucose blood (ONETOUCH VERIO) test strip Test glucose 4 times day. 150 each 2   senna-docusate (SENOKOT-S) 8.6-50 MG tablet Take 2 tablets by mouth 2 (two) times daily. 60 tablet 3   spironolactone (ALDACTONE) 25 MG tablet TAKE 1 TABLET(25 MG) BY MOUTH DAILY (Patient not taking: Reported on 01/14/2024) 90 tablet 1     Discharge Medications: Please see after visit summary for a list of discharge medications.  Relevant Imaging Results:  Relevant Lab Results:   Additional Information SSN [id number]  Isabella Bowens, [place]

## 2024-01-15 MED ORDER — OXYCODONE HCL 5 MG PO TABS: 5.0000 mg | ORAL_TABLET | Freq: Four times a day (QID) | ORAL | 0 refills | Status: DC | PRN

## 2024-01-15 MED ORDER — EMPAGLIFLOZIN 25 MG PO TABS
25.0000 mg | ORAL_TABLET | Freq: Every day | ORAL | Status: DC
  Administered 2024-01-15: 25 mg via ORAL
  Filled 2024-01-15 (×4): qty 1

## 2024-01-15 NOTE — ED Notes (Signed)
 Pt ambulated to bathroom unassisted.

## 2024-01-15 NOTE — Discharge Instructions (Addendum)
 You were seen for your fall in the emergency department.  He declined placement in rehab.  At home, please take Tylenol  for your knee pain.  Use topical gels like Voltaren  gel for the pain. You may also take the oxycodone  we have prescribed you for any breakthrough pain that may have.  Do not take this before driving or operating heavy machinery.  Do not take this medication with alcohol .  Check your MyChart online for the results of any tests that had not resulted by the time you left the emergency department.   Follow-up with your primary doctor in 2-3 days regarding your visit.    Return immediately to the emergency department if you experience any of the following: Falls, or any other concerning symptoms.    Thank you for visiting our Emergency Department. It was a pleasure taking care of you today.

## 2024-01-15 NOTE — ED Provider Notes (Signed)
 Pt declined rehab facility. She would prefer to go home with HHPT at this time. Has been able to ambulate with a walker in the emergency department.  Says that her daughter lives in the same community and can check on her.  Patient discharged home with oxycodone  prescription and instructions to follow-up with her primary doctor in several days along with sports medicine.   Yolande Lamar BROCKS, MD 01/15/24 1537

## 2024-01-15 NOTE — ED Notes (Signed)
Pt ambulated to bathroom with tech no assistance.

## 2024-01-15 NOTE — ED Provider Notes (Signed)
 Emergency Medicine Observation Re-evaluation Note  Maureen Jackson is a 68 y.o. female, seen on rounds today.  Pt initially presented to the ED for complaints of Fall and Knee Pain Currently, the patient is not having any acute complaints.  Physical Exam  BP 122/72   Pulse 69   Temp 97.8 F (36.6 C) (Oral)   Resp 19   Ht 5' 5 (1.651 m)   Wt (!) 154.2 kg   SpO2 97%   BMI 56.58 kg/m  Physical Exam General: Resting comfortably in stretcher Lungs: Normal work of breathing Psych: Calm MSK: hematoma above L patella, no joint effusion palpated, full rom of knee  ED Course / MDM  EKG:   I have reviewed the labs performed to date as well as medications administered while in observation.  Recent changes in the last 24 hours include was seen by PT and will be going to snf.  Plan  Current plan is for snf placement.    Yolande Lamar BROCKS, MD 01/15/24 [phone]

## 2024-01-15 NOTE — ED Notes (Addendum)
 CSW spoke with pt at bedside regarding SNF options . Pt declined stating  I don't want to go to [place] I have no transportation, that is too far . CSW explained that the hospital will transport her there and if she needs a ride home the SNF can assist with transportation. Pt stated that she will think about it and for CSW to follow up after lunch.   CSW went back to speak with pt at bedside. Pt very adamant about not going to [place] and CSW asked pt about HHPT since no other facility accepted her and pt stated ,  that is fine, we can try that . [facility]le[facility] Cella able to accept referral. CSW asked  MD to order HHPT , but order is still not in. TOC signing off .

## 2024-01-15 NOTE — ED Notes (Signed)
 Pt was sitting in recliner, pt slid to the end of the recliner and the recliner tipped over, pt was half on the foot of the recliner and half on the floor. staff assisted pt to the floor and removed recliner. Pt was able to get on her knees with staff assistance and get back in the recliner. Daughter and EPD aware.

## 2024-01-16 NOTE — ED Provider Notes (Signed)
 Emergency Medicine Observation Re-evaluation Note  Maureen Jackson is a 68 y.o. female, seen on rounds today.  Pt initially presented to the ED for complaints of Fall and Knee Pain Currently, the patient is not having acute complaints.  Says that she was able to ambulate without difficulty to the restroom.  Still requesting to go home.  Physical Exam  BP 121/70 (BP Location: Left Arm)   Pulse 72   Temp 97.6 F (36.4 C) (Oral)   Resp 19   Ht 5' 5 (1.651 m)   Wt (!) 154.2 kg   SpO2 96%   BMI 56.58 kg/m  Physical Exam General: Resting comfortably in stretcher Lungs: Normal work of breathing Psych: Calm  ED Course / MDM  EKG:   I have reviewed the labs performed to date as well as medications administered while in observation.  Recent changes in the last 24 hours include patient has been able to ambulate to the bathroom and back without assistance.  Was initially supposed to go to [place] rehab but the patient declined.  Patient still requesting go home at this time.  Plan  Current plan is for discharge home.    Yolande Lamar BROCKS, MD 01/16/24 [phone]

## 2024-01-16 NOTE — ED Notes (Signed)
 CSW spoke with patient this morning at bedside regarding the incident that occurred with her sliding out the recliner yesterday. CSW asked pt again if she wanted to go to [place] and pt declined again stating ,  I will need to go Monday  CSW explained to her that she is medically ready to DC and would leave today , pt still declined stating that she needed to go home to pay bills and will go afterwards. CSW then explained to pt that if she DC home insurance will not cover for her to go to a SNF. Patient understood and stated she was going on . Pt called daughter to pick her up, CSW shared with pt that her [facility] agency is [facility]. TOC signing off.

## 2024-01-25 DIAGNOSIS — Z7985 Long-term (current) use of injectable non-insulin antidiabetic drugs: Secondary | ICD-10-CM | POA: Diagnosis not present

## 2024-01-25 DIAGNOSIS — I10 Essential (primary) hypertension: Secondary | ICD-10-CM | POA: Diagnosis not present

## 2024-01-25 DIAGNOSIS — G8911 Acute pain due to trauma: Secondary | ICD-10-CM | POA: Diagnosis not present

## 2024-01-25 DIAGNOSIS — E119 Type 2 diabetes mellitus without complications: Secondary | ICD-10-CM | POA: Diagnosis not present

## 2024-01-25 DIAGNOSIS — Z7902 Long term (current) use of antithrombotics/antiplatelets: Secondary | ICD-10-CM | POA: Diagnosis not present

## 2024-01-25 DIAGNOSIS — M25562 Pain in left knee: Secondary | ICD-10-CM | POA: Diagnosis not present

## 2024-01-25 DIAGNOSIS — Z7984 Long term (current) use of oral hypoglycemic drugs: Secondary | ICD-10-CM | POA: Diagnosis not present

## 2024-01-25 DIAGNOSIS — Z794 Long term (current) use of insulin: Secondary | ICD-10-CM | POA: Diagnosis not present

## 2024-01-25 DIAGNOSIS — Z9181 History of falling: Secondary | ICD-10-CM | POA: Diagnosis not present

## 2024-01-28 ENCOUNTER — Encounter (HOSPITAL_COMMUNITY): Payer: 59 | Admitting: Cardiology

## 2024-01-29 ENCOUNTER — Encounter (HOSPITAL_COMMUNITY): Payer: 59 | Admitting: Cardiology

## 2024-02-07 DIAGNOSIS — E1121 Type 2 diabetes mellitus with diabetic nephropathy: Secondary | ICD-10-CM | POA: Diagnosis not present

## 2024-02-07 DIAGNOSIS — E1122 Type 2 diabetes mellitus with diabetic chronic kidney disease: Secondary | ICD-10-CM | POA: Diagnosis not present

## 2024-02-07 DIAGNOSIS — N1831 Chronic kidney disease, stage 3a: Secondary | ICD-10-CM | POA: Diagnosis not present

## 2024-02-07 DIAGNOSIS — E038 Other specified hypothyroidism: Secondary | ICD-10-CM | POA: Diagnosis not present

## 2024-02-07 DIAGNOSIS — M1710 Unilateral primary osteoarthritis, unspecified knee: Secondary | ICD-10-CM | POA: Diagnosis not present

## 2024-02-07 DIAGNOSIS — I10 Essential (primary) hypertension: Secondary | ICD-10-CM | POA: Diagnosis not present

## 2024-02-07 DIAGNOSIS — Z0000 Encounter for general adult medical examination without abnormal findings: Secondary | ICD-10-CM | POA: Diagnosis not present

## 2024-02-07 DIAGNOSIS — I5022 Chronic systolic (congestive) heart failure: Secondary | ICD-10-CM | POA: Diagnosis not present

## 2024-02-07 DIAGNOSIS — J4520 Mild intermittent asthma, uncomplicated: Secondary | ICD-10-CM | POA: Diagnosis not present

## 2024-02-07 DIAGNOSIS — M5459 Other low back pain: Secondary | ICD-10-CM | POA: Diagnosis not present

## 2024-02-13 DIAGNOSIS — Z7985 Long-term (current) use of injectable non-insulin antidiabetic drugs: Secondary | ICD-10-CM | POA: Diagnosis not present

## 2024-02-13 DIAGNOSIS — M25562 Pain in left knee: Secondary | ICD-10-CM | POA: Diagnosis not present

## 2024-02-13 DIAGNOSIS — Z9181 History of falling: Secondary | ICD-10-CM | POA: Diagnosis not present

## 2024-02-13 DIAGNOSIS — E119 Type 2 diabetes mellitus without complications: Secondary | ICD-10-CM | POA: Diagnosis not present

## 2024-02-13 DIAGNOSIS — I10 Essential (primary) hypertension: Secondary | ICD-10-CM | POA: Diagnosis not present

## 2024-02-13 DIAGNOSIS — G8911 Acute pain due to trauma: Secondary | ICD-10-CM | POA: Diagnosis not present

## 2024-02-13 DIAGNOSIS — Z794 Long term (current) use of insulin: Secondary | ICD-10-CM | POA: Diagnosis not present

## 2024-02-13 DIAGNOSIS — Z7902 Long term (current) use of antithrombotics/antiplatelets: Secondary | ICD-10-CM | POA: Diagnosis not present

## 2024-02-13 DIAGNOSIS — Z7984 Long term (current) use of oral hypoglycemic drugs: Secondary | ICD-10-CM | POA: Diagnosis not present

## 2024-02-16 ENCOUNTER — Other Ambulatory Visit: Payer: Self-pay | Admitting: Cardiology

## 2024-02-16 ENCOUNTER — Other Ambulatory Visit: Payer: Self-pay | Admitting: Nurse Practitioner

## 2024-02-21 ENCOUNTER — Ambulatory Visit: Payer: 59 | Admitting: Nurse Practitioner

## 2024-02-26 ENCOUNTER — Encounter (HOSPITAL_COMMUNITY): Payer: 59 | Admitting: Cardiology

## 2024-03-10 ENCOUNTER — Encounter (HOSPITAL_COMMUNITY): Payer: 59 | Admitting: Cardiology

## 2024-03-24 ENCOUNTER — Encounter (HOSPITAL_COMMUNITY): Admitting: Cardiology

## 2024-04-15 ENCOUNTER — Ambulatory Visit: Admitting: Nurse Practitioner

## 2024-05-16 ENCOUNTER — Other Ambulatory Visit: Payer: Self-pay

## 2024-05-16 ENCOUNTER — Encounter (HOSPITAL_COMMUNITY): Payer: Self-pay | Admitting: *Deleted

## 2024-05-16 ENCOUNTER — Emergency Department (HOSPITAL_COMMUNITY)

## 2024-05-16 ENCOUNTER — Inpatient Hospital Stay (HOSPITAL_COMMUNITY)
Admission: EM | Admit: 2024-05-16 | Discharge: 2024-05-25 | DRG: 291 | Disposition: A | Discharge status: Home / Self Care | Attending: Internal Medicine | Admitting: Internal Medicine

## 2024-05-16 DIAGNOSIS — Z7984 Long term (current) use of oral hypoglycemic drugs: Secondary | ICD-10-CM

## 2024-05-16 DIAGNOSIS — Z7901 Long term (current) use of anticoagulants: Secondary | ICD-10-CM | POA: Diagnosis not present

## 2024-05-16 DIAGNOSIS — I3139 Other pericardial effusion (noninflammatory): Secondary | ICD-10-CM | POA: Diagnosis present

## 2024-05-16 DIAGNOSIS — J811 Chronic pulmonary edema: Secondary | ICD-10-CM | POA: Diagnosis not present

## 2024-05-16 DIAGNOSIS — M069 Rheumatoid arthritis, unspecified: Secondary | ICD-10-CM | POA: Diagnosis not present

## 2024-05-16 DIAGNOSIS — N1832 Chronic kidney disease, stage 3b: Secondary | ICD-10-CM | POA: Diagnosis not present

## 2024-05-16 DIAGNOSIS — I82441 Acute embolism and thrombosis of right tibial vein: Secondary | ICD-10-CM | POA: Diagnosis not present

## 2024-05-16 DIAGNOSIS — I130 Hypertensive heart and chronic kidney disease with heart failure and stage 1 through stage 4 chronic kidney disease, or unspecified chronic kidney disease: Secondary | ICD-10-CM | POA: Diagnosis not present

## 2024-05-16 DIAGNOSIS — H409 Unspecified glaucoma: Secondary | ICD-10-CM | POA: Diagnosis present

## 2024-05-16 DIAGNOSIS — I2510 Atherosclerotic heart disease of native coronary artery without angina pectoris: Secondary | ICD-10-CM | POA: Diagnosis present

## 2024-05-16 DIAGNOSIS — N178 Other acute kidney failure: Secondary | ICD-10-CM | POA: Diagnosis not present

## 2024-05-16 DIAGNOSIS — I3481 Nonrheumatic mitral (valve) annulus calcification: Secondary | ICD-10-CM | POA: Diagnosis present

## 2024-05-16 DIAGNOSIS — I5020 Unspecified systolic (congestive) heart failure: Secondary | ICD-10-CM | POA: Diagnosis not present

## 2024-05-16 DIAGNOSIS — I428 Other cardiomyopathies: Secondary | ICD-10-CM | POA: Diagnosis not present

## 2024-05-16 DIAGNOSIS — I82409 Acute embolism and thrombosis of unspecified deep veins of unspecified lower extremity: Secondary | ICD-10-CM | POA: Diagnosis present

## 2024-05-16 DIAGNOSIS — R6889 Other general symptoms and signs: Secondary | ICD-10-CM | POA: Diagnosis not present

## 2024-05-16 DIAGNOSIS — I10 Essential (primary) hypertension: Secondary | ICD-10-CM | POA: Diagnosis present

## 2024-05-16 DIAGNOSIS — Z7902 Long term (current) use of antithrombotics/antiplatelets: Secondary | ICD-10-CM | POA: Diagnosis not present

## 2024-05-16 DIAGNOSIS — G8929 Other chronic pain: Secondary | ICD-10-CM | POA: Diagnosis present

## 2024-05-16 DIAGNOSIS — I509 Heart failure, unspecified: Secondary | ICD-10-CM | POA: Diagnosis not present

## 2024-05-16 DIAGNOSIS — E1122 Type 2 diabetes mellitus with diabetic chronic kidney disease: Secondary | ICD-10-CM | POA: Diagnosis present

## 2024-05-16 DIAGNOSIS — R9431 Abnormal electrocardiogram [ECG] [EKG]: Secondary | ICD-10-CM | POA: Diagnosis not present

## 2024-05-16 DIAGNOSIS — I5023 Acute on chronic systolic (congestive) heart failure: Principal | ICD-10-CM | POA: Diagnosis present

## 2024-05-16 DIAGNOSIS — I517 Cardiomegaly: Secondary | ICD-10-CM | POA: Diagnosis not present

## 2024-05-16 DIAGNOSIS — I824Y1 Acute embolism and thrombosis of unspecified deep veins of right proximal lower extremity: Secondary | ICD-10-CM | POA: Diagnosis not present

## 2024-05-16 DIAGNOSIS — I110 Hypertensive heart disease with heart failure: Secondary | ICD-10-CM | POA: Diagnosis not present

## 2024-05-16 DIAGNOSIS — N179 Acute kidney failure, unspecified: Secondary | ICD-10-CM | POA: Diagnosis not present

## 2024-05-16 DIAGNOSIS — Z833 Family history of diabetes mellitus: Secondary | ICD-10-CM

## 2024-05-16 DIAGNOSIS — R918 Other nonspecific abnormal finding of lung field: Secondary | ICD-10-CM | POA: Diagnosis not present

## 2024-05-16 DIAGNOSIS — I447 Left bundle-branch block, unspecified: Secondary | ICD-10-CM | POA: Diagnosis not present

## 2024-05-16 DIAGNOSIS — Z91148 Patient's other noncompliance with medication regimen for other reason: Secondary | ICD-10-CM

## 2024-05-16 DIAGNOSIS — G4733 Obstructive sleep apnea (adult) (pediatric): Secondary | ICD-10-CM | POA: Diagnosis present

## 2024-05-16 DIAGNOSIS — I82401 Acute embolism and thrombosis of unspecified deep veins of right lower extremity: Secondary | ICD-10-CM | POA: Diagnosis not present

## 2024-05-16 DIAGNOSIS — Z9889 Other specified postprocedural states: Secondary | ICD-10-CM

## 2024-05-16 DIAGNOSIS — Z794 Long term (current) use of insulin: Secondary | ICD-10-CM | POA: Diagnosis not present

## 2024-05-16 DIAGNOSIS — M79605 Pain in left leg: Secondary | ICD-10-CM | POA: Diagnosis not present

## 2024-05-16 DIAGNOSIS — E039 Hypothyroidism, unspecified: Secondary | ICD-10-CM | POA: Diagnosis not present

## 2024-05-16 DIAGNOSIS — I5042 Chronic combined systolic (congestive) and diastolic (congestive) heart failure: Secondary | ICD-10-CM | POA: Diagnosis present

## 2024-05-16 DIAGNOSIS — I255 Ischemic cardiomyopathy: Secondary | ICD-10-CM | POA: Diagnosis present

## 2024-05-16 DIAGNOSIS — K219 Gastro-esophageal reflux disease without esophagitis: Secondary | ICD-10-CM | POA: Diagnosis present

## 2024-05-16 DIAGNOSIS — Z955 Presence of coronary angioplasty implant and graft: Secondary | ICD-10-CM

## 2024-05-16 DIAGNOSIS — E782 Mixed hyperlipidemia: Secondary | ICD-10-CM | POA: Diagnosis present

## 2024-05-16 DIAGNOSIS — Z7982 Long term (current) use of aspirin: Secondary | ICD-10-CM

## 2024-05-16 DIAGNOSIS — E890 Postprocedural hypothyroidism: Secondary | ICD-10-CM | POA: Diagnosis present

## 2024-05-16 DIAGNOSIS — Z1152 Encounter for screening for COVID-19: Secondary | ICD-10-CM

## 2024-05-16 DIAGNOSIS — R61 Generalized hyperhidrosis: Secondary | ICD-10-CM | POA: Diagnosis not present

## 2024-05-16 DIAGNOSIS — Z9089 Acquired absence of other organs: Secondary | ICD-10-CM

## 2024-05-16 DIAGNOSIS — Z885 Allergy status to narcotic agent status: Secondary | ICD-10-CM

## 2024-05-16 DIAGNOSIS — Z886 Allergy status to analgesic agent status: Secondary | ICD-10-CM

## 2024-05-16 DIAGNOSIS — Z8249 Family history of ischemic heart disease and other diseases of the circulatory system: Secondary | ICD-10-CM

## 2024-05-16 DIAGNOSIS — Z7985 Long-term (current) use of injectable non-insulin antidiabetic drugs: Secondary | ICD-10-CM

## 2024-05-16 DIAGNOSIS — R0602 Shortness of breath: Secondary | ICD-10-CM | POA: Diagnosis not present

## 2024-05-16 DIAGNOSIS — Z90710 Acquired absence of both cervix and uterus: Secondary | ICD-10-CM

## 2024-05-16 DIAGNOSIS — N189 Chronic kidney disease, unspecified: Secondary | ICD-10-CM | POA: Diagnosis not present

## 2024-05-16 DIAGNOSIS — Z87891 Personal history of nicotine dependence: Secondary | ICD-10-CM

## 2024-05-16 DIAGNOSIS — G4730 Sleep apnea, unspecified: Secondary | ICD-10-CM | POA: Diagnosis present

## 2024-05-16 DIAGNOSIS — J90 Pleural effusion, not elsewhere classified: Secondary | ICD-10-CM | POA: Diagnosis not present

## 2024-05-16 DIAGNOSIS — Z555 Less than a high school diploma: Secondary | ICD-10-CM

## 2024-05-16 DIAGNOSIS — Z79890 Hormone replacement therapy (postmenopausal): Secondary | ICD-10-CM

## 2024-05-16 DIAGNOSIS — Z880 Allergy status to penicillin: Secondary | ICD-10-CM

## 2024-05-16 DIAGNOSIS — Z6841 Body Mass Index (BMI) 40.0 and over, adult: Secondary | ICD-10-CM

## 2024-05-16 DIAGNOSIS — T501X6A Underdosing of loop [high-ceiling] diuretics, initial encounter: Secondary | ICD-10-CM | POA: Diagnosis present

## 2024-05-16 DIAGNOSIS — Z79899 Other long term (current) drug therapy: Secondary | ICD-10-CM

## 2024-05-16 LAB — TROPONIN I (HIGH SENSITIVITY)
Troponin I (High Sensitivity): 25 ng/L — ABNORMAL HIGH (ref <18)
Troponin I (High Sensitivity): 29 ng/L — ABNORMAL HIGH (ref <18)

## 2024-05-16 LAB — CBC
HCT: 35.6 % — ABNORMAL LOW (ref 36.0–46.0)
Hemoglobin: 10.9 g/dL — ABNORMAL LOW (ref 12.0–15.0)
MCH: 26.7 pg (ref 26.0–34.0)
MCHC: 30.6 g/dL (ref 30.0–36.0)
MCV: 87 fL (ref 80.0–100.0)
Platelets: 208 10*3/uL (ref 150–400)
RBC: 4.09 MIL/uL (ref 3.87–5.11)
RDW: 14.7 % (ref 11.5–15.5)
WBC: 5.6 10*3/uL (ref 4.0–10.5)
nRBC: 0 % (ref 0.0–0.2)

## 2024-05-16 LAB — COMPREHENSIVE METABOLIC PANEL WITH GFR
ALT: 31 U/L (ref 0–44)
AST: 42 U/L — ABNORMAL HIGH (ref 15–41)
Albumin: 3.1 g/dL — ABNORMAL LOW (ref 3.5–5.0)
Alkaline Phosphatase: 248 U/L — ABNORMAL HIGH (ref 38–126)
Anion gap: 8 (ref 5–15)
BUN: 19 mg/dL (ref 8–23)
CO2: 26 mmol/L (ref 22–32)
Calcium: 8.1 mg/dL — ABNORMAL LOW (ref 8.9–10.3)
Chloride: 108 mmol/L (ref 98–111)
Creatinine, Ser: 1.27 mg/dL — ABNORMAL HIGH (ref 0.44–1.00)
GFR, Estimated: 47 mL/min — ABNORMAL LOW
Glucose, Bld: 132 mg/dL — ABNORMAL HIGH (ref 70–99)
Potassium: 3.8 mmol/L (ref 3.5–5.1)
Sodium: 142 mmol/L (ref 135–145)
Total Bilirubin: 1.1 mg/dL (ref 0.0–1.2)
Total Protein: 7 g/dL (ref 6.5–8.1)

## 2024-05-16 LAB — RESP PANEL BY RT-PCR (RSV, FLU A&B, COVID)  RVPGX2
Influenza A by PCR: NEGATIVE
Influenza B by PCR: NEGATIVE
Resp Syncytial Virus by PCR: NEGATIVE
SARS Coronavirus 2 by RT PCR: NEGATIVE

## 2024-05-16 LAB — TSH: TSH: 135.541 u[IU]/mL — ABNORMAL HIGH (ref 0.350–4.500)

## 2024-05-16 LAB — BRAIN NATRIURETIC PEPTIDE: B Natriuretic Peptide: 141 pg/mL — ABNORMAL HIGH (ref 0.0–100.0)

## 2024-05-16 MED ORDER — ACETAMINOPHEN 325 MG PO TABS
650.0000 mg | ORAL_TABLET | ORAL | Status: DC | PRN
  Administered 2024-05-16 – 2024-05-19 (×3): 650 mg via ORAL
  Filled 2024-05-16 (×3): qty 2

## 2024-05-16 MED ORDER — LEVOTHYROXINE SODIUM 50 MCG PO TABS
250.0000 ug | ORAL_TABLET | Freq: Every day | ORAL | Status: DC
  Administered 2024-05-17 – 2024-05-25 (×9): 250 ug via ORAL
  Filled 2024-05-16 (×8): qty 1
  Filled 2024-05-16: qty 2
  Filled 2024-05-16: qty 1

## 2024-05-16 MED ORDER — METOPROLOL SUCCINATE ER 25 MG PO TB24
25.0000 mg | ORAL_TABLET | Freq: Every day | ORAL | Status: DC
  Administered 2024-05-17 – 2024-05-25 (×9): 25 mg via ORAL
  Filled 2024-05-16 (×9): qty 1

## 2024-05-16 MED ORDER — SACUBITRIL-VALSARTAN 97-103 MG PO TABS
1.0000 | ORAL_TABLET | Freq: Two times a day (BID) | ORAL | Status: DC
  Administered 2024-05-16 – 2024-05-17 (×3): 1 via ORAL
  Filled 2024-05-16 (×3): qty 1

## 2024-05-16 MED ORDER — SENNOSIDES-DOCUSATE SODIUM 8.6-50 MG PO TABS
2.0000 | ORAL_TABLET | Freq: Two times a day (BID) | ORAL | Status: DC
  Administered 2024-05-16: 2 via ORAL
  Filled 2024-05-16 (×3): qty 2

## 2024-05-16 MED ORDER — ALBUTEROL SULFATE (2.5 MG/3ML) 0.083% IN NEBU: 2.5000 mg | INHALATION_SOLUTION | RESPIRATORY_TRACT | Status: DC | PRN

## 2024-05-16 MED ORDER — ENOXAPARIN SODIUM 80 MG/0.8ML IJ SOSY
75.0000 mg | PREFILLED_SYRINGE | INTRAMUSCULAR | Status: DC
  Filled 2024-05-16: qty 0.8

## 2024-05-16 MED ORDER — FUROSEMIDE 10 MG/ML IJ SOLN
40.0000 mg | INTRAMUSCULAR | Status: AC
  Administered 2024-05-16: 40 mg via INTRAVENOUS
  Filled 2024-05-16: qty 4

## 2024-05-16 MED ORDER — LEVOTHYROXINE SODIUM 50 MCG PO TABS: 50.0000 ug | ORAL_TABLET | Freq: Every day | ORAL | Status: DC

## 2024-05-16 MED ORDER — FUROSEMIDE 10 MG/ML IJ SOLN
60.0000 mg | Freq: Two times a day (BID) | INTRAMUSCULAR | Status: DC
  Administered 2024-05-17 (×2): 60 mg via INTRAVENOUS
  Filled 2024-05-16 (×2): qty 6

## 2024-05-16 MED ORDER — BRIMONIDINE TARTRATE-TIMOLOL 0.2-0.5 % OP SOLN
1.0000 [drp] | Freq: Two times a day (BID) | OPHTHALMIC | Status: DC
  Filled 2024-05-16: qty 5

## 2024-05-16 MED ORDER — EMPAGLIFLOZIN 25 MG PO TABS
25.0000 mg | ORAL_TABLET | Freq: Every day | ORAL | Status: DC
  Administered 2024-05-17: 25 mg via ORAL
  Filled 2024-05-16 (×3): qty 1

## 2024-05-16 MED ORDER — ATORVASTATIN CALCIUM 20 MG PO TABS
20.0000 mg | ORAL_TABLET | Freq: Every day | ORAL | Status: DC
  Administered 2024-05-17 – 2024-05-25 (×9): 20 mg via ORAL
  Filled 2024-05-16 (×9): qty 1

## 2024-05-16 MED ORDER — SPIRONOLACTONE 25 MG PO TABS
25.0000 mg | ORAL_TABLET | Freq: Every day | ORAL | Status: DC
  Administered 2024-05-17 – 2024-05-18 (×2): 25 mg via ORAL
  Filled 2024-05-16 (×2): qty 1

## 2024-05-16 MED ORDER — FUROSEMIDE 10 MG/ML IJ SOLN
40.0000 mg | Freq: Once | INTRAMUSCULAR | Status: AC
  Administered 2024-05-16: 40 mg via INTRAVENOUS
  Filled 2024-05-16: qty 4

## 2024-05-16 MED ORDER — CALCIUM CARBONATE ANTACID 500 MG PO CHEW
4.0000 | CHEWABLE_TABLET | Freq: Two times a day (BID) | ORAL | Status: DC
  Administered 2024-05-17 – 2024-05-25 (×16): 800 mg via ORAL
  Filled 2024-05-16 (×17): qty 4

## 2024-05-16 NOTE — ED Triage Notes (Signed)
 Pt brought in by [facility] from home with c/o SOB that started Wednesday, worsening this morning. EMS arrived and they report pt was breathing shallow. EMS vitals- 180/105, O2 sat 95% on RA, CBG 122. Pt was placed on 3L by EMS and she reports she felt better after the O2 was applied.

## 2024-05-16 NOTE — Progress Notes (Signed)
 PHARMACIST - PHYSICIAN COMMUNICATION  CONCERNING:  Enoxaparin  (Lovenox ) for DVT Prophylaxis    RECOMMENDATION: Patient was prescribed enoxaparin  40mg  q24 hours for VTE prophylaxis.   Filed Weights   05/16/24 1506  Weight: (!) 153.3 kg (338 lb)    Body mass index is 56.25 kg/m.  Estimated Creatinine Clearance: 65.7 mL/min (A) (by C-G formula based on SCr of 1.27 mg/dL (H)).  Based on [facility] policy patient is candidate for enoxaparin  0.5mg /kg TBW SQ every 24 hours based on BMI being >30.  DESCRIPTION: Pharmacy has adjusted enoxaparin  dose per [facility] policy.  Patient is now receiving enoxaparin  75 mg every 24 hours    Page Boast 05/16/2024 6:27 PM

## 2024-05-16 NOTE — H&P (Signed)
 [facility] H&P   Patient Demographics:    Maureen Jackson, is a 68 y.o. female  MRN: [id number]   DOB - Jun 04, 1956  Admit Date - 05/16/2024  Outpatient Primary MD for the patient is Veda Gerald, MD  Referring MD/NP/PA: Dr. Adan Holms  Outpatient Specialists: See [facility] cardiology and CHF team  Patient coming from: Home  Chief Complaint  Patient presents with   Shortness of Breath      HPI:    Maureen Jackson  is a 68 y.o. female,  with HFrEF 2/2 ischemic cardiomyopathy, hypothyroidism, CAD s/p PCI to the LAD and RCA , PAD, HTN, T2DM, OSA not on CPAP, CKD and morbid obesity,/nonischemic cardiomyopathy with a EF 35%, history of pericardial effusion status post pericardial window -Patient presents to ED secondary to complaints of shortness of breath, she denies any chest pain, fever, chills, reports shortness of breath progressive over the last week, but significant today which prompted her to come to ED, patient report she has not been taking her Lasix . - In ED chest x-ray significant for enlarged cardiac silhouette, possible pulmonary edema, she has elevated BNP, bedside ultrasound by ED physician with finding of small pericardial effusion, she was started on IV lasix , ED discussed with cardiology at [facility], who recommended admission to [facility] and to obtain echo and to diurese.    Review of systems:      A full 10 point Review of Systems was done, except as stated above, all other Review of Systems were negative.   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 multivessel CAD by cath in 09/2017 and not felt to be a CABG candidate --> underwent two-vessel  PCI with DES to the LAD and DES to the RCA   Diabetes mellitus    Diabetes mellitus, type II (HCC)    Dyspnea    Glaucoma    Hypertension    Left bundle branch block    Morbid obesity (HCC)    Obstructive sleep apnea    does not wear CPAP   Thyroid  disease       Past Surgical History:  Procedure Laterality Date   ABDOMINAL HYSTERECTOMY     CARDIAC CATHETERIZATION  09/12/2017   CORONARY STENT INTERVENTION  09/12/2017   STENT RESOLUTE ONYX 5.28U13 drug eluting stent was successfully placed   CORONARY STENT INTERVENTION N/A 09/12/2017   Procedure: CORONARY STENT INTERVENTION;  Surgeon: Arleen Lacer, MD;  Location: [facility] INVASIVE CV LAB;  Service: Cardiovascular;  Laterality: N/A;   INCISION AND DRAINAGE PERIRECTAL ABSCESS Left 09/16/2019   Procedure: IRRIGATION AND DEBRIDEMENT LABIA  ABSCESS;  Surgeon: Oza Blumenthal, MD;  Location: [facility] OR;  Service: General;  Laterality: Left;   IR FLUORO GUIDE CV LINE RIGHT  09/16/2019   IR US  GUIDE VASC ACCESS RIGHT  09/16/2019   LEFT HEART CATH AND CORONARY ANGIOGRAPHY N/A 09/12/2017   Procedure: LEFT HEART CATH AND CORONARY ANGIOGRAPHY;  Surgeon: Arleen Lacer, MD;  Location: [facility] INVASIVE CV LAB;  Service: Cardiovascular;  Laterality: N/A;   MASS EXCISION N/A 12/18/2018   Procedure: EXCISION TONGUE MASS;  Surgeon: Reynold Caves, MD;  Location: [facility] OR;  Service: ENT;  Laterality: N/A;   PERICARDIOCENTESIS N/A 12/22/2022   Procedure: PERICARDIOCENTESIS;  Surgeon: Kyra Phy, MD;  Location: [facility] INVASIVE CV LAB;  Service: Cardiovascular;  Laterality: N/A;   RIGHT/LEFT HEART CATH AND CORONARY ANGIOGRAPHY N/A 09/10/2017   Procedure: RIGHT/LEFT HEART CATH AND CORONARY ANGIOGRAPHY;  Surgeon: Millicent Ally, MD;  Location: [facility] INVASIVE CV LAB;  Service: Cardiovascular;  Laterality: N/A;   SUBXYPHOID PERICARDIAL WINDOW N/A 12/23/2022   Procedure: SUBXYPHOID PERICARDIAL WINDOW;  Surgeon: Melene Sportsman, MD;  Location: [facility] OR;  Service: Thoracic;  Laterality: N/A;    TEE WITHOUT CARDIOVERSION N/A 12/23/2022   Procedure: TRANSESOPHAGEAL ECHOCARDIOGRAM (TEE);  Surgeon: Melene Sportsman, MD;  Location: [facility] OR;  Service: Thoracic;  Laterality: N/A;   THYROID  SURGERY        Social History:     Social History   Tobacco Use   Smoking status: Former    Current packs/day: 0.00    Types: Cigarettes    Quit date: 12/14/2005    Years since quitting: 18.4    Passive exposure: Never   Smokeless tobacco: Never   Tobacco comments:    QUIT IN 2005  Substance Use Topics   Alcohol use: No        Family History :     Family History  Problem Relation Age of Onset   Diabetes Mother    Hypertension Mother    Cancer Mother        pancreas   Hypertension Sister      Home Medications:   Prior to Admission medications   Medication Sig Start Date End Date Taking? Authorizing Provider  acetaminophen  (TYLENOL ) 325 MG tablet Take 2 tablets (650 mg total) by mouth every 4 (four) hours as needed for mild pain, fever or headache. 05/25/20  Yes Emokpae, Courage, MD  atorvastatin  (LIPITOR ) 80 MG tablet TAKE 1 TABLET(80 MG) BY MOUTH DAILY 04/23/23  Yes Branch, Joyceann No, MD  calcium  carbonate (TUMS - DOSED IN MG ELEMENTAL CALCIUM ) 500 MG chewable tablet Chew 4 tablets by mouth 2 (two) times daily.   Yes [provider]  cetirizine  (ZYRTEC ) 10 MG tablet Take 10 mg by mouth daily as needed for allergies.  09/12/18  Yes [provider]  clopidogrel  (PLAVIX ) 75 MG tablet TAKE 1 TABLET(75 MG) BY MOUTH DAILY 01/10/24  Yes Branch, Joyceann No, MD  COMBIGAN  0.2-0.5 % ophthalmic solution Place 1 drop into both eyes 2 (two) times daily. 02/08/22  Yes [provider]  furosemide  (LASIX ) 40 MG tablet Take 1.5 tablets (60 mg total) by mouth daily. 12/11/23  Yes Clegg, Amy D, NP  insulin  aspart (NOVOLOG ) 100 UNIT/ML injection Inject 0-15 Units into the skin 3 (three) times daily with meals. 01/02/23  Yes Wynetta Heckle, MD  JARDIANCE  25 MG TABS tablet Take 25 mg by  mouth daily. 08/02/22  Yes [provider]  levothyroxine  (SYNTHROID ) 200 MCG tablet Take 200 mcg by mouth daily  before breakfast. Take 1 tablet with 50mcg for a total dose of 250mcg daily   Yes [provider]  levothyroxine  (SYNTHROID ) 50 MCG tablet Take 50 mcg by mouth daily before breakfast. Take 1 tablet with 200mcg for a total dose of 250mcg daily   Yes [provider]  metoprolol  succinate (TOPROL -XL) 25 MG 24 hr tablet TAKE 1 TABLET(25 MG) BY MOUTH DAILY 02/18/24  Yes Lasalle Pointer, NP  OZEMPIC, 0.25 OR 0.5 MG/DOSE, 2 MG/3ML SOPN Inject 0.5 mg into the skin once a week. Fridays 05/03/23  Yes [provider]  sacubitril -valsartan  (ENTRESTO ) 97-103 MG Take 1 tablet by mouth 2 (two) times daily. 11/20/23  Yes Sabharwal, Aditya, DO  senna-docusate (SENOKOT-S) 8.6-50 MG tablet Take 2 tablets by mouth 2 (two) times daily. Patient taking differently: Take 2 tablets by mouth at bedtime as needed for mild constipation. 05/25/20  Yes Emokpae, Courage, MD  spironolactone  (ALDACTONE ) 25 MG tablet TAKE 1 TABLET(25 MG) BY MOUTH DAILY 09/03/23  Yes Lasalle Pointer, NP  VENTOLIN  HFA 108 (90 Base) MCG/ACT inhaler Inhale 1-2 puffs into the lungs every 6 (six) hours as needed for wheezing or shortness of breath. 05/25/20  Yes Colin Dawley, MD  Blood Pressure Monitoring (OMRON 3 SERIES BP MONITOR) DEVI Use as directed 08/27/23   Lasalle Pointer, NP  glucose blood (ONETOUCH VERIO) test strip Test glucose 4 times day. 04/19/22   Nida, Gebreselassie W, MD     Allergies:     Allergies  Allergen Reactions   Ampicillin  Other (See Comments)    "Allergic," per [facility]   Aspirin  Nausea Only   Hydrocodone  Other (See Comments)    "Allergic," per [facility]   Unasyn  [Ampicillin -Sulbactam Sodium ] Rash and Other (See Comments)    "Allergic," per [facility]     Physical Exam:   Vitals  Blood pressure (!) 142/93, pulse 83, temperature 97.9 F (36.6 C), temperature source Oral, resp. rate 18,  height 5\' 5"  (1.651 m), weight (!) 153.3 kg, SpO2 94%.   1. General Morbidly obese female, laying in bed, no apparent distress  2. Normal affect and insight, Not Suicidal or Homicidal, Awake Alert, Oriented X 3.  3. No F.N deficits, ALL C.Nerves Intact, Strength 5/5 all 4 extremities, Sensation intact all 4 extremities, Plantars down going.  4. Ears and Eyes appear Normal, Conjunctivae clear, PERRLA. Moist Oral Mucosa.  5. Supple Neck, JVD could not be appreciated, but she has a big neck  6. Symmetrical Chest wall movement, distant lung sounds due to body habitus, some crackles at the bases, no wheezing  7. RRR, No Gallops, Rubs or Murmurs, No Parasternal Heave.  8. Positive Bowel Sounds, Abdomen Soft, No tenderness, No organomegaly appriciated,No rebound -guarding or rigidity.  9.  No Cyanosis, Normal Skin Turgor, No Skin Rash or Bruise.  10. Good muscle tone,  joints appear normal , no effusions, Normal ROM.     Data Review:    CBC Recent Labs  Lab 05/16/24 1536  WBC 5.6  HGB 10.9*  HCT 35.6*  PLT 208  MCV 87.0  MCH 26.7  MCHC 30.6  RDW 14.7   ------------------------------------------------------------------------------------------------------------------  Chemistries  Recent Labs  Lab 05/16/24 1536  NA 142  K 3.8  CL 108  CO2 26  GLUCOSE 132*  BUN 19  CREATININE 1.27*  CALCIUM  8.1*  AST 42*  ALT 31  ALKPHOS 248*  BILITOT 1.1   ------------------------------------------------------------------------------------------------------------------ estimated creatinine clearance is 65.7 mL/min (A) (by C-G formula based on SCr of 1.27 mg/dL (H)). ------------------------------------------------------------------------------------------------------------------  No results for input(s): "TSH", "T4TOTAL", "T3FREE", "THYROIDAB" in the last 72 hours.  Invalid input(s): "FREET3"  Coagulation profile No results for input(s): "INR", "PROTIME" in the last 168  hours. ------------------------------------------------------------------------------------------------------------------- No results for input(s): "DDIMER" in the last 72 hours. -------------------------------------------------------------------------------------------------------------------  Cardiac Enzymes No results for input(s): "CKMB", "TROPONINI", "MYOGLOBIN" in the last 168 hours.  Invalid input(s): "CK" ------------------------------------------------------------------------------------------------------------------    Component Value Date/Time   BNP 141.0 (H) 05/16/2024 1536     ---------------------------------------------------------------------------------------------------------------  Urinalysis    Component Value Date/Time   COLORURINE YELLOW 12/31/2022 1745   APPEARANCEUR HAZY (A) 12/31/2022 1745   LABSPEC 1.013 12/31/2022 1745   PHURINE 5.0 12/31/2022 1745   GLUCOSEU >=500 (A) 12/31/2022 1745   HGBUR NEGATIVE 12/31/2022 1745   BILIRUBINUR NEGATIVE 12/31/2022 1745   KETONESUR NEGATIVE 12/31/2022 1745   PROTEINUR NEGATIVE 12/31/2022 1745   UROBILINOGEN 0.2 10/02/2015 2320   NITRITE NEGATIVE 12/31/2022 1745   LEUKOCYTESUR NEGATIVE 12/31/2022 1745    ----------------------------------------------------------------------------------------------------------------   Imaging Results:    DG Chest Port 1 View Result Date: 05/16/2024 CLINICAL DATA:  sob EXAM: PORTABLE CHEST - 1 VIEW COMPARISON:  October 08, 2023 FINDINGS: Diffuse bilateral perihilar interstitial opacities. Worsening enlargement of the cardiac silhouette. No acute fracture or destructive lesions. Multilevel thoracic osteophytosis. IMPRESSION: 1. Diffuse bilateral perihilar interstitial opacities, likely reflecting interstitial edema. 2. Worsening enlargement of the cardiac silhouette, possibly from progressive cardiomegaly or superimposed pericardial effusion. Electronically Signed   By: Rance Burrows  M.D.   On: 05/16/2024 15:52   EKG:   Vent. rate 79 BPM PR interval 149 ms QRS duration 162 ms QT/QTcB 463/531 ms P-R-T axes 45 83 -47 Sinus rhythm Consider right atrial enlargement Nonspecific intraventricular conduction delay Probable anteroseptal infarct, recent   Assessment & Plan:    Principal Problem:   Acute on chronic systolic CHF (congestive heart failure) (HCC) Active Problems:   Hypothyroidism   Essential hypertension, benign   GERD   Morbid obesity/BMI > 55   Mixed hyperlipidemia   Acute on chronic systolic CHF Ischemic and nonischemic cardiomyopathy Pulmonary edema - Is presenting with dyspnea, chest x-ray with pulmonary,, increased cardiac silhouette and elevated BNP. -Start on IV Lasix  60 mg IV twice daily, daily weights, strict ins and out-continue with G GDMT regimen including Entresto , Toprol -XL, Aldactone  and Jardiance . - Will repeat 2D echo -Will obtain lower extremity Dopplers given some lower extremity pain and poor ambulation to rule out DVT  History of pericardial effusion s/p window - History of pericardial effusion, failed pericardiocentesis; s/p window in 12/2022 -Chest x-ray with increased cardiac silhouette, but bedside echo by ED physician with no evidence of large pleural effusion, as well she is status post pericardial window which should prevent recurrence of effusion -Will obtain 2D echo - Felt to be secondary to hypothyroidism.    CAD - continue Lipitor , Toprol   - Will hold Plavix  for now pending echo results to ensure no further procedure needed  - She denies any chest pain  -No chest pain.    CKD 3A-3B -To monitor closely as on IV diuresis  Morbid obesity -Body mass index is 56.25 kg/m. -He is on Ozempic  Hypertension - Continue with home regimen   Diabetes mellitus, type II -She is on Jardiance  and Ozempic, continue with Jardiance , Ozempic is nonformulary, will add insulin  sliding scale  Hypothyroidism, post  thyroidectomy - Continue with home dose Synthroid , I will check TSH  Deconditioning - Will consult PT, OT   DVT Prophylaxis Lovenox    AM  Labs Ordered, also please review Full Orders  Family Communication: Admission, patients condition and plan of care including tests being ordered have been discussed with the patient who indicate understanding and agree with the plan and Code Status.  Code Status full code  Likely DC to  home  Consults called: none    Admission status: observation    Time spent in minutes : 70 minutes   Seena Dadds M.D on 05/16/2024 at 6:26 PM   [facility] - Office  [phone]

## 2024-05-16 NOTE — ED Provider Notes (Signed)
 [place] EMERGENCY DEPARTMENT AT [facility] Provider Note   CSN: [id number] Arrival date & time: 05/16/24  1458     History  Chief Complaint  Patient presents with   Shortness of Breath    Maureen Jackson is a 68 y.o. female.  68 year old female with history of CAD status post PCI, hypertension, diabetes, OSA, ischemic cardiomyopathy, and morbid obesity presents emergency department shortness of breath.  Patient reports that this morning started having some shortness of breath.  Denies any chest pain at all.  No cough.  Has had some chills but no other symptoms or any other symptoms.  Hasn't been taking lasix .        Home Medications Prior to Admission medications   Medication Sig Start Date End Date Taking? Authorizing Provider  acetaminophen  (TYLENOL ) 325 MG tablet Take 2 tablets (650 mg total) by mouth every 4 (four) hours as needed for mild pain, fever or headache. 05/25/20  Yes Emokpae, Courage, MD  atorvastatin  (LIPITOR ) 80 MG tablet TAKE 1 TABLET(80 MG) BY MOUTH DAILY 04/23/23  Yes Branch, Joyceann No, MD  calcium  carbonate (TUMS - DOSED IN MG ELEMENTAL CALCIUM ) 500 MG chewable tablet Chew 4 tablets by mouth 2 (two) times daily.   Yes [provider]  cetirizine  (ZYRTEC ) 10 MG tablet Take 10 mg by mouth daily as needed for allergies.  09/12/18  Yes [provider]  clopidogrel  (PLAVIX ) 75 MG tablet TAKE 1 TABLET(75 MG) BY MOUTH DAILY 01/10/24  Yes Branch, Joyceann No, MD  COMBIGAN  0.2-0.5 % ophthalmic solution Place 1 drop into both eyes 2 (two) times daily. 02/08/22  Yes [provider]  furosemide  (LASIX ) 40 MG tablet Take 1.5 tablets (60 mg total) by mouth daily. 12/11/23  Yes Clegg, Amy D, NP  insulin  aspart (NOVOLOG ) 100 UNIT/ML injection Inject 0-15 Units into the skin 3 (three) times daily with meals. 01/02/23  Yes Wynetta Heckle, MD  JARDIANCE  25 MG TABS tablet Take 25 mg by mouth daily. 08/02/22  Yes [provider]   levothyroxine  (SYNTHROID ) 200 MCG tablet Take 200 mcg by mouth daily before breakfast. Take 1 tablet with 50mcg for a total dose of 250mcg daily   Yes [provider]  levothyroxine  (SYNTHROID ) 50 MCG tablet Take 50 mcg by mouth daily before breakfast. Take 1 tablet with 200mcg for a total dose of 250mcg daily   Yes [provider]  metoprolol  succinate (TOPROL -XL) 25 MG 24 hr tablet TAKE 1 TABLET(25 MG) BY MOUTH DAILY 02/18/24  Yes Lasalle Pointer, NP  OZEMPIC, 0.25 OR 0.5 MG/DOSE, 2 MG/3ML SOPN Inject 0.5 mg into the skin once a week. Fridays 05/03/23  Yes [provider]  sacubitril -valsartan  (ENTRESTO ) 97-103 MG Take 1 tablet by mouth 2 (two) times daily. 11/20/23  Yes Sabharwal, Aditya, DO  senna-docusate (SENOKOT-S) 8.6-50 MG tablet Take 2 tablets by mouth 2 (two) times daily. Patient taking differently: Take 2 tablets by mouth at bedtime as needed for mild constipation. 05/25/20  Yes Emokpae, Courage, MD  spironolactone  (ALDACTONE ) 25 MG tablet TAKE 1 TABLET(25 MG) BY MOUTH DAILY 09/03/23  Yes Lasalle Pointer, NP  VENTOLIN  HFA 108 (90 Base) MCG/ACT inhaler Inhale 1-2 puffs into the lungs every 6 (six) hours as needed for wheezing or shortness of breath. 05/25/20  Yes Colin Dawley, MD  Blood Pressure Monitoring (OMRON 3 SERIES BP MONITOR) DEVI Use as directed 08/27/23   Lasalle Pointer, NP  glucose blood (ONETOUCH VERIO) test strip Test glucose 4 times day. 04/19/22  Nida, Gebreselassie W, MD      Allergies    Ampicillin , Aspirin , Hydrocodone , and Unasyn  [ampicillin -sulbactam sodium ]    Review of Systems   Review of Systems  Physical Exam Updated Vital Signs BP (!) 128/92   Pulse 79   Temp 97.9 F (36.6 C) (Oral)   Resp 18   Ht 5\' 5"  (1.651 m)   Wt (!) 159.5 kg   SpO2 95%   BMI 58.51 kg/m  Physical Exam Constitutional:      General: She is not in acute distress.    Appearance: She is obese. She is not ill-appearing.  Cardiovascular:     Rate and  Rhythm: Normal rate and regular rhythm.     Pulses: Normal pulses.     Heart sounds: Normal heart sounds. No murmur heard. Pulmonary:     Effort: Pulmonary effort is normal. No respiratory distress.     Breath sounds: Normal breath sounds.     Comments: Limited due to habitus Musculoskeletal:     Right lower leg: Edema present.     Left lower leg: Edema present.  Neurological:     Mental Status: She is alert.     ED Results / Procedures / Treatments   Labs (all labs ordered are listed, but only abnormal results are displayed) Labs Reviewed  COMPREHENSIVE METABOLIC PANEL WITH GFR - Abnormal; Notable for the following components:      Result Value   Glucose, Bld 132 (*)    Creatinine, Ser 1.27 (*)    Calcium  8.1 (*)    Albumin  3.1 (*)    AST 42 (*)    Alkaline Phosphatase 248 (*)    GFR, Estimated 47 (*)    All other components within normal limits  CBC - Abnormal; Notable for the following components:   Hemoglobin 10.9 (*)    HCT 35.6 (*)    All other components within normal limits  BRAIN NATRIURETIC PEPTIDE - Abnormal; Notable for the following components:   B Natriuretic Peptide 141.0 (*)    All other components within normal limits  BASIC METABOLIC PANEL WITH GFR - Abnormal; Notable for the following components:   Glucose, Bld 117 (*)    Creatinine, Ser 1.42 (*)    Calcium  7.9 (*)    GFR, Estimated 41 (*)    All other components within normal limits  CBC - Abnormal; Notable for the following components:   Hemoglobin 10.9 (*)    HCT 35.9 (*)    All other components within normal limits  TSH - Abnormal; Notable for the following components:   TSH 135.541 (*)    All other components within normal limits  TROPONIN I (HIGH SENSITIVITY) - Abnormal; Notable for the following components:   Troponin I (High Sensitivity) 25 (*)    All other components within normal limits  TROPONIN I (HIGH SENSITIVITY) - Abnormal; Notable for the following components:   Troponin I (High  Sensitivity) 29 (*)    All other components within normal limits  RESP PANEL BY RT-PCR (RSV, FLU A&B, COVID)  RVPGX2  BRAIN NATRIURETIC PEPTIDE  HIV ANTIBODY (ROUTINE TESTING W REFLEX)  HEMOGLOBIN A1C    EKG EKG Interpretation Date/Time:  Friday May 16 2024 16:18:26 EDT Ventricular Rate:  79 PR Interval:  149 QRS Duration:  162 QT Interval:  463 QTC Calculation: 531 R Axis:   83  Text Interpretation: Sinus rhythm Consider right atrial enlargement Nonspecific intraventricular conduction delay Probable anteroseptal infarct, recent Confirmed by Shyrl Doyne [phone])  on 05/16/2024 5:57:51 PM  Radiology US  Venous Img Lower Bilateral (DVT) Result Date: 05/17/2024 CLINICAL DATA:  Lower extremity pain. EXAM: BILATERAL LOWER EXTREMITY VENOUS DOPPLER ULTRASOUND TECHNIQUE: Gray-scale sonography with graded compression, as well as color Doppler and duplex ultrasound were performed to evaluate the lower extremity deep venous systems from the level of the common femoral vein and including the common femoral, femoral, profunda femoral, popliteal and calf veins including the posterior tibial, peroneal and gastrocnemius veins when visible. The superficial great saphenous vein was also interrogated. Spectral Doppler was utilized to evaluate flow at rest and with distal augmentation maneuvers in the common femoral, femoral and popliteal veins. COMPARISON:  None Available. FINDINGS: RIGHT LOWER EXTREMITY Common Femoral Vein: No evidence of thrombus. Normal compressibility, respiratory phasicity and response to augmentation. Saphenofemoral Junction: No evidence of thrombus. Normal compressibility and flow on color Doppler imaging. Profunda Femoral Vein: No evidence of thrombus. Normal compressibility and flow on color Doppler imaging. Femoral Vein: No evidence of thrombus. Normal compressibility, respiratory phasicity and response to augmentation. Popliteal Vein: No evidence of thrombus. Normal compressibility,  respiratory phasicity and response to augmentation. Calf Veins: Absent color Doppler flow within the noncompressible right posterior tibial vein noted. Superficial Great Saphenous Vein: No evidence of thrombus. Normal compressibility. Venous Reflux:  None. Other Findings:  None. LEFT LOWER EXTREMITY Common Femoral Vein: No evidence of thrombus. Normal compressibility, respiratory phasicity and response to augmentation. Saphenofemoral Junction: No evidence of thrombus. Normal compressibility and flow on color Doppler imaging. Profunda Femoral Vein: No evidence of thrombus. Normal compressibility and flow on color Doppler imaging. Femoral Vein: No evidence of thrombus. Normal compressibility, respiratory phasicity and response to augmentation. Popliteal Vein: No evidence of thrombus. Normal compressibility, respiratory phasicity and response to augmentation. Calf Veins: No evidence of thrombus. Normal compressibility and flow on color Doppler imaging. Superficial Great Saphenous Vein: No evidence of thrombus. Normal compressibility. Venous Reflux:  None. Other Findings:  None. IMPRESSION: 1. Positive for deep vein thrombosis involving the right posterior tibial vein. 2. No evidence for deep vein thrombosis within the left lower extremity. These results will be called to the ordering clinician or representative by the Radiologist Assistant, and communication documented in the PACS or [employer]. Electronically Signed   By: Kimberley Penman M.D.   On: 05/17/2024 11:15   DG Chest Port 1 View Result Date: 05/16/2024 CLINICAL DATA:  sob EXAM: PORTABLE CHEST - 1 VIEW COMPARISON:  October 08, 2023 FINDINGS: Diffuse bilateral perihilar interstitial opacities. Worsening enlargement of the cardiac silhouette. No acute fracture or destructive lesions. Multilevel thoracic osteophytosis. IMPRESSION: 1. Diffuse bilateral perihilar interstitial opacities, likely reflecting interstitial edema. 2. Worsening enlargement of the  cardiac silhouette, possibly from progressive cardiomegaly or superimposed pericardial effusion. Electronically Signed   By: Rance Burrows M.D.   On: 05/16/2024 15:52    Procedures Procedures  3   EMERGENCY DEPARTMENT US  CARDIAC EXAM "Study: Limited Ultrasound of the Heart and Pericardium"  INDICATIONS:Dyspnea Multiple views of the heart and pericardium were obtained in real-time with a multi-frequency probe.  PERFORMED LK:GMWNUU IMAGES ARCHIVED?: No LIMITATIONS:  Body habitus VIEWS USED: Parasternal long axis and Parasternal short axis INTERPRETATION: Cardiac activity present, Pericardial effusion present, Amount of pericardial effusion small, and Decreased contractility   Medications Ordered in ED Medications  acetaminophen  (TYLENOL ) tablet 650 mg (650 mg Oral Given 05/16/24 2229)  atorvastatin  (LIPITOR ) tablet 20 mg (20 mg Oral Given 05/17/24 0816)  metoprolol  succinate (TOPROL -XL) 24 hr tablet 25 mg (25 mg Oral Given 05/17/24  7829)  sacubitril -valsartan  (ENTRESTO ) 97-103 mg per tablet (1 tablet Oral Given 05/17/24 0816)  spironolactone  (ALDACTONE ) tablet 25 mg (25 mg Oral Given 05/17/24 0816)  empagliflozin  (JARDIANCE ) tablet 25 mg (25 mg Oral Given 05/17/24 0947)  levothyroxine  (SYNTHROID ) tablet 250 mcg (250 mcg Oral Given 05/17/24 0630)  calcium  carbonate (TUMS - dosed in mg elemental calcium ) chewable tablet 800 mg of elemental calcium  (800 mg of elemental calcium  Oral Given 05/17/24 0816)  senna-docusate (Senokot-S) tablet 2 tablet (2 tablets Oral Patient Refused/Not Given 05/17/24 0946)  furosemide  (LASIX ) injection 60 mg (60 mg Intravenous Given 05/17/24 0817)  albuterol  (PROVENTIL ) (2.5 MG/3ML) 0.083% nebulizer solution 2.5 mg (has no administration in time range)  clopidogrel  (PLAVIX ) tablet 75 mg (75 mg Oral Given 05/17/24 0817)  brimonidine  (ALPHAGAN ) 0.2 % ophthalmic solution 1 drop (1 drop Both Eyes Given 05/17/24 0947)    And  timolol  (TIMOPTIC ) 0.5 % ophthalmic solution 1 drop (1 drop  Both Eyes Given 05/17/24 0947)  insulin  aspart (novoLOG ) injection 0-15 Units (has no administration in time range)  enoxaparin  (LOVENOX ) injection 80 mg (has no administration in time range)  ondansetron  (ZOFRAN ) injection 4 mg (has no administration in time range)  prochlorperazine (COMPAZINE) injection 10 mg (has no administration in time range)  furosemide  (LASIX ) injection 40 mg (40 mg Intravenous Given 05/16/24 1733)  furosemide  (LASIX ) injection 40 mg (40 mg Intravenous Given 05/16/24 2021)    ED Course/ Medical Decision Making/ A&P Clinical Course as of 05/17/24 1130  Fri May 16, 2024  1634 B Natriuretic Peptide(!): 141.0 At baseline [RP]  1634 DG Chest Port 1 View Shows interstitial edema and enlarged cardiac sac concerning for cardiomegaly or pericardial effusion [RP]  1745 Discussed with Dr. Veryl Gottron from cardiology feels that the patient may have residual pericardial effusion but with the window does not require transfer.  Will benefit from repeat echo. [RP]  1800 Dr Osborne Blazer from hospitalist consulted for admission [RP]    Clinical Course User Index [RP] Ninetta Basket, MD                                 Medical Decision Making Amount and/or Complexity of Data Reviewed Labs: ordered. Decision-making details documented in ED Course. Radiology: ordered. Decision-making details documented in ED Course.  Risk Prescription drug management. Decision regarding hospitalization.   68 year old female with history of CAD status post PCI, hypertension, diabetes, OSA, ischemic cardiomyopathy, and morbid obesity presents emergency department shortness of breath.    Initial Ddx:  CHF, pericardial effusion, pneumonia, URI, MI, PE  MDM/Course:  Patient presents emergency department with shortness of breath.  Reports being out of her medications for a while and has had some leg swelling as well.  Does have a history of a pericardial effusion and has had a pericardial window.   Says that this feels similar to that.  No chest pain to suggest MI or PE.  Vital signs are overall reassuring.  No new oxygen  requirement.  Her exam is somewhat limited due to habitus but no significant Rales.  Bedside echo was performed that was also unfortunately limited due to habitus and poor windows.  It does show a small pericardial effusion with a decreased EF.  Chest x-ray shows developing interstitial edema with enlarged cardiac silhouette.  Was discussed with cardiology given the fact that she has had this effusion before and appears to be reaccumulating despite the window.  They reported that this  appears to be chronic and that she can have a formal echocardiogram but can stay at the current hospital rather than requiring transfer to [facility].  Upon re-evaluation was stable.  Patient given Lasix  for suspected heart failure exacerbation and admitted to hospitalist.  This patient presents to the ED for concern of complaints listed in HPI, this involves an extensive number of treatment options, and is a complaint that carries with it a high risk of complications and morbidity. Disposition including potential need for admission considered.   Dispo: Admit to Floor  Records reviewed Outpatient Clinic Notes The following labs were independently interpreted: Chemistry and show CKD I independently reviewed the following imaging with scope of interpretation limited to determining acute life threatening conditions related to emergency care: Chest x-ray and agree with the radiologist interpretation with the following exceptions: none I personally reviewed and interpreted cardiac monitoring: normal sinus rhythm  I personally reviewed and interpreted the pt's EKG: see above for interpretation  I have reviewed the patients home medications and made adjustments as needed Consults: Cardiology and Hospitalist  Portions of this note were generated with Dragon dictation software. Dictation errors may occur  despite best attempts at proofreading.     Final Clinical Impression(s) / ED Diagnoses Final diagnoses:  Acute on chronic congestive heart failure, unspecified heart failure type (HCC)  Pericardial effusion    Rx / DC Orders ED Discharge Orders     None         Ninetta Basket, MD 05/17/24 1131

## 2024-05-17 ENCOUNTER — Encounter (HOSPITAL_COMMUNITY): Payer: Self-pay | Admitting: Internal Medicine

## 2024-05-17 ENCOUNTER — Observation Stay (HOSPITAL_BASED_OUTPATIENT_CLINIC_OR_DEPARTMENT_OTHER)

## 2024-05-17 ENCOUNTER — Other Ambulatory Visit (HOSPITAL_COMMUNITY): Payer: Self-pay | Admitting: *Deleted

## 2024-05-17 ENCOUNTER — Observation Stay (HOSPITAL_COMMUNITY)

## 2024-05-17 DIAGNOSIS — I824Y1 Acute embolism and thrombosis of unspecified deep veins of right proximal lower extremity: Secondary | ICD-10-CM

## 2024-05-17 DIAGNOSIS — K219 Gastro-esophageal reflux disease without esophagitis: Secondary | ICD-10-CM

## 2024-05-17 DIAGNOSIS — M79605 Pain in left leg: Secondary | ICD-10-CM | POA: Diagnosis not present

## 2024-05-17 DIAGNOSIS — E039 Hypothyroidism, unspecified: Secondary | ICD-10-CM | POA: Diagnosis not present

## 2024-05-17 DIAGNOSIS — I5023 Acute on chronic systolic (congestive) heart failure: Secondary | ICD-10-CM

## 2024-05-17 DIAGNOSIS — I82441 Acute embolism and thrombosis of right tibial vein: Secondary | ICD-10-CM | POA: Diagnosis not present

## 2024-05-17 DIAGNOSIS — I3139 Other pericardial effusion (noninflammatory): Secondary | ICD-10-CM | POA: Diagnosis not present

## 2024-05-17 DIAGNOSIS — E782 Mixed hyperlipidemia: Secondary | ICD-10-CM

## 2024-05-17 LAB — ECHOCARDIOGRAM COMPLETE
Area-P 1/2: 2.81 cm2
Height: 65 in
S' Lateral: 4.6 cm
Weight: 5626.14 [oz_av]

## 2024-05-17 LAB — BASIC METABOLIC PANEL WITH GFR
Anion gap: 9 (ref 5–15)
BUN: 21 mg/dL (ref 8–23)
CO2: 27 mmol/L (ref 22–32)
Calcium: 7.9 mg/dL — ABNORMAL LOW (ref 8.9–10.3)
Chloride: 108 mmol/L (ref 98–111)
Creatinine, Ser: 1.42 mg/dL — ABNORMAL HIGH (ref 0.44–1.00)
GFR, Estimated: 41 mL/min — ABNORMAL LOW (ref >60)
Glucose, Bld: 117 mg/dL — ABNORMAL HIGH (ref 70–99)
Potassium: 3.8 mmol/L (ref 3.5–5.1)
Sodium: 144 mmol/L (ref 135–145)

## 2024-05-17 LAB — CBC
HCT: 35.9 % — ABNORMAL LOW (ref 36.0–46.0)
Hemoglobin: 10.9 g/dL — ABNORMAL LOW (ref 12.0–15.0)
MCH: 26.8 pg (ref 26.0–34.0)
MCHC: 30.4 g/dL (ref 30.0–36.0)
MCV: 88.2 fL (ref 80.0–100.0)
Platelets: 194 10*3/uL (ref 150–400)
RBC: 4.07 MIL/uL (ref 3.87–5.11)
RDW: 14.9 % (ref 11.5–15.5)
WBC: 5.7 10*3/uL (ref 4.0–10.5)
nRBC: 0 % (ref 0.0–0.2)

## 2024-05-17 LAB — GLUCOSE, CAPILLARY
Glucose-Capillary: 159 mg/dL — ABNORMAL HIGH (ref 70–99)
Glucose-Capillary: 172 mg/dL — ABNORMAL HIGH (ref 70–99)
Glucose-Capillary: 155 mg/dL — ABNORMAL HIGH (ref 70–99)

## 2024-05-17 LAB — HIV ANTIBODY (ROUTINE TESTING W REFLEX): HIV Screen 4th Generation wRfx: NONREACTIVE

## 2024-05-17 LAB — T4, FREE: Free T4: <0.25 ng/dL — ABNORMAL LOW (ref 0.61–1.12)

## 2024-05-17 LAB — BRAIN NATRIURETIC PEPTIDE: B Natriuretic Peptide: 94 pg/mL (ref 0.0–100.0)

## 2024-05-17 MED ORDER — ONDANSETRON HCL 4 MG/2ML IJ SOLN
4.0000 mg | Freq: Four times a day (QID) | INTRAMUSCULAR | Status: DC | PRN
  Administered 2024-05-17 – 2024-05-22 (×4): 4 mg via INTRAVENOUS
  Filled 2024-05-17 (×4): qty 2

## 2024-05-17 MED ORDER — PROCHLORPERAZINE EDISYLATE 10 MG/2ML IJ SOLN: 10.0000 mg | INTRAMUSCULAR | Status: DC | PRN

## 2024-05-17 MED ORDER — ENOXAPARIN SODIUM 80 MG/0.8ML IJ SOSY: 80.0000 mg | PREFILLED_SYRINGE | INTRAMUSCULAR | Status: DC

## 2024-05-17 MED ORDER — CLOPIDOGREL BISULFATE 75 MG PO TABS
75.0000 mg | ORAL_TABLET | Freq: Every day | ORAL | Status: DC
  Administered 2024-05-17 – 2024-05-18 (×2): 75 mg via ORAL
  Filled 2024-05-17 (×2): qty 1

## 2024-05-17 MED ORDER — INSULIN ASPART 100 UNIT/ML IJ SOLN
0.0000 [IU] | Freq: Three times a day (TID) | INTRAMUSCULAR | Status: DC
  Administered 2024-05-17 – 2024-05-18 (×3): 3 [IU] via SUBCUTANEOUS
  Administered 2024-05-18 – 2024-05-19 (×4): 2 [IU] via SUBCUTANEOUS
  Administered 2024-05-20: 3 [IU] via SUBCUTANEOUS
  Administered 2024-05-20 – 2024-05-25 (×10): 2 [IU] via SUBCUTANEOUS

## 2024-05-17 MED ORDER — PERFLUTREN LIPID MICROSPHERE
1.0000 mL | INTRAVENOUS | Status: AC | PRN
  Administered 2024-05-17: 4 mL via INTRAVENOUS

## 2024-05-17 MED ORDER — BRIMONIDINE TARTRATE 0.2 % OP SOLN
1.0000 [drp] | Freq: Two times a day (BID) | OPHTHALMIC | Status: DC
  Administered 2024-05-17 – 2024-05-25 (×17): 1 [drp] via OPHTHALMIC
  Filled 2024-05-17 (×2): qty 5

## 2024-05-17 MED ORDER — TIMOLOL MALEATE 0.5 % OP SOLN
1.0000 [drp] | Freq: Two times a day (BID) | OPHTHALMIC | Status: DC
  Administered 2024-05-17 – 2024-05-25 (×17): 1 [drp] via OPHTHALMIC
  Filled 2024-05-17 (×2): qty 5

## 2024-05-17 MED ORDER — APIXABAN 5 MG PO TABS
5.0000 mg | ORAL_TABLET | Freq: Two times a day (BID) | ORAL | Status: DC
  Administered 2024-05-24 – 2024-05-25 (×3): 5 mg via ORAL
  Filled 2024-05-17 (×3): qty 1

## 2024-05-17 MED ORDER — APIXABAN 5 MG PO TABS
10.0000 mg | ORAL_TABLET | Freq: Two times a day (BID) | ORAL | Status: AC
  Administered 2024-05-17 – 2024-05-23 (×14): 10 mg via ORAL
  Filled 2024-05-17 (×14): qty 2

## 2024-05-17 MED ORDER — POTASSIUM CHLORIDE CRYS ER 20 MEQ PO TBCR
40.0000 meq | EXTENDED_RELEASE_TABLET | Freq: Every day | ORAL | Status: DC
  Administered 2024-05-17 – 2024-05-18 (×2): 40 meq via ORAL
  Filled 2024-05-17 (×2): qty 2

## 2024-05-17 NOTE — Evaluation (Signed)
 Physical Therapy Evaluation Patient Details Name: Maureen Jackson MRN: [id number] DOB: [birth year] Today's Date: 05/17/2024  History of Present Illness  Maureen Jackson  is a 68 y.o. 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  - In ED chest x-ray significant for enlarged cardiac silhouette, possible pulmonary edema, she has elevated BNP, bedside ultrasound by ED physician with finding of small pericardial effusion, she was started on IV lasix , ED discussed with cardiology at [facility], who recommended admission to [facility] and to obtain echo and to diurese.   Clinical Impression  Patient agreeable to PT evaluation. Reports at baseline, she receives assistance with ADLs and iADLs from PCA who is her daughter and uses rollator and [facility] for mobility in home. On this date, patient is mod(I) for supine>sit, min A for sit>supine for LLE handling, total A for donning shoes which she reports as usual, and CGA for safety, prog to mod(I) for short ambulation at bedside w/ RW.  Patient reports aside from daughter/PCA her church members help her as needed. Would recommend bariatric RW for patient once home for safety to decrease fear of falling and HHPT services. Patient will benefit from continued skilled physical therapy acutely and in recommended venue in order to address strength, endurance, and balance deficits to improve function/QOL.           If plan is discharge home, recommend the following: A little help  with walking and/or transfers;A little help with bathing/dressing/bathroom;Assist for transportation;Assistance with cooking/housework   Can travel by private Tax inspector (2 wheels)  Recommendations for Other Services       Functional Status Assessment Patient has had a recent decline in their functional status and demonstrates the ability to make significant improvements in function in a reasonable and predictable amount of time.     Precautions / Restrictions Precautions Precautions: Fall Recall of Precautions/Restrictions: Intact Restrictions Weight Bearing Restrictions Per Provider Order: No      Mobility  Bed Mobility Overal bed mobility: Needs Assistance Bed Mobility: Sidelying to Sit, Supine to Sit   Sidelying to sit: Used rails, Min assist Supine to sit: Used rails, Min assist     General bed mobility comments: HOB flat, uses L railing to roll onto L side for supine>sit. Min A only for LLE handling during sit to supine, Slow labored movement throughout    Transfers Overall transfer level: Needs assistance Equipment used: Rolling walker (2 wheels) Transfers: Sit to/from Stand Sit to Stand: Contact guard assist           General transfer comment: CGA for safety during STS w/ RW. Slow labored movement    Ambulation/Gait Ambulation/Gait assistance: Contact guard assist Gait Distance (Feet): 5 Feet Assistive device: Rolling walker (2 wheels) Gait Pattern/deviations: Decreased step length - right, Decreased step length - left, Decreased stride length Gait velocity: Dec     General Gait Details: Pt limited to a few short steps at bedside w/ RW and CGA for  safety. Pt demo inc SOB following short gait trial.  Stairs            Wheelchair Mobility     Tilt Bed    Modified Rankin (Stroke Patients Only)       Balance Overall balance assessment: Needs assistance Sitting-balance support: Feet supported,  Feet unsupported, Single extremity supported, No upper extremity supported Sitting balance-Leahy Scale: Good Sitting balance - Comments: Fair/good seated EOB, w/ varying UE/LE support   Standing balance support: Bilateral upper extremity supported, During functional activity, Reliant on assistive device for balance Standing balance-Leahy Scale: Fair Standing balance comment: Fair/good w/ RW and CGA             Pertinent Vitals/Pain Pain Assessment Pain Assessment: 0-10 Pain Score: 5  Pain Location: Stomach and feet Pain Descriptors / Indicators: Discomfort, Aching Pain Intervention(s): Limited activity within patient's tolerance, Monitored during session    Home Living Family/patient expects to be discharged to:: Private residence Living Arrangements: Alone Available Help at Discharge: Personal care attendant Type of Home: Apartment Home Access: Level entry       Home Layout: One level Home Equipment: Grab bars - tub/shower;Cane - single point;Rollator (4 wheels) Additional Comments: Pt reports her daughter is her PCA, helps her with ADLs, and iADLs. She comes everyday except wednesdays and the weekend, 5hrs/day. Also has church members who come and help her when needed    Prior Function Prior Level of Function : Needs assist       Physical Assist : Mobility (physical);ADLs (physical) Mobility (physical): Bed mobility;Transfers;Gait;Stairs ADLs (physical): Dressing;Bathing;IADLs Mobility Comments: household ambulation with AD, rollator or SPC ADLs Comments: PCA 4 days/week for ADLs and iADLs     Extremity/Trunk Assessment   Upper Extremity Assessment Upper Extremity Assessment: Defer to OT evaluation    Lower Extremity Assessment Lower Extremity Assessment: Generalized weakness;Overall [facility] for tasks assessed (Pt generally weak throughout, although adequate for functional transfers and short ambulation. Total A needed for donning shoes and LB dressing.)     Cervical / Trunk Assessment Cervical / Trunk Assessment: Kyphotic  Communication   Communication Communication: No apparent difficulties    Cognition Arousal: Alert Behavior During Therapy: WFL for tasks assessed/performed   PT - Cognitive impairments: No apparent impairments       Following commands: Intact       Cueing Cueing Techniques: Verbal cues     General Comments      Exercises     Assessment/Plan    PT Assessment All further PT needs can be met in the next venue of care  PT Problem List Decreased strength;Decreased activity tolerance;Decreased balance;Decreased mobility;Pain       PT Treatment Interventions      PT Goals (Current goals can be found in the Care Plan section)  Acute Rehab PT Goals Patient Stated Goal: Return home with HHPT services and bariatric RW PT Goal Formulation: With patient Time For Goal Achievement: 05/19/24 Potential to Achieve Goals: Good    Frequency       Co-evaluation               AM-PAC PT "6 Clicks" Mobility  Outcome Measure Help needed turning from your back to your side while in a flat bed without using bedrails?: A Little Help needed moving from lying on your back to sitting on the side of a flat bed without using bedrails?: A Little Help needed moving to and from a bed to a chair (including a wheelchair)?: A Little Help  needed standing up from a chair using your arms (e.g., wheelchair or bedside chair)?: None Help needed to walk in hospital room?: A Little Help needed climbing 3-5 steps with a railing? : A Lot 6 Click Score: 18    End of Session   Activity Tolerance: Patient tolerated treatment well;Patient limited by fatigue Patient left: in bed;with call bell/phone within reach;with bed alarm set   PT Visit Diagnosis: Difficulty in walking, not elsewhere classified (R26.2);Other abnormalities of gait and mobility (R26.89);Pain;Muscle weakness (generalized) (M62.81);History of falling  (Z91.81) Pain - part of body:  (Stomach and feet)    Time: 3086-5784 PT Time Calculation (min) (ACUTE ONLY): 24 min   Charges:   PT Evaluation $PT Eval Moderate Complexity: 1 Mod PT Treatments $Therapeutic Activity: 23-37 mins PT General Charges $$ ACUTE PT VISIT: 1 Visit        11:55 AM, 05/17/24 Marysue Sola, PT, DPT [place] with [facility]

## 2024-05-17 NOTE — Progress Notes (Signed)
 PHARMACY - ANTICOAGULATION CONSULT NOTE  Pharmacy Consult for apixaban Indication: DVT  Allergies  Allergen Reactions   Ampicillin  Other (See Comments)    "Allergic," per MAR   Aspirin  Nausea Only   Hydrocodone  Other (See Comments)    "Allergic," per MAR   Unasyn  [Ampicillin -Sulbactam Sodium ] Rash and Other (See Comments)    "Allergic," per [facility]    Patient Measurements: Height: 5\' 5"  (165.1 cm) Weight: (!) 159.5 kg (351 lb 10.1 oz) IBW/kg (Calculated) : 57 HEPARIN  DW (KG): 97.8  Vital Signs: Temp: 97.9 F (36.6 C) (06/07 0619) Temp Source: Oral (06/07 0619) BP: 128/92 (06/07 0817) Pulse Rate: 79 (06/07 0817)  Labs: Recent Labs    05/16/24 1536 05/16/24 1713 05/17/24 0652  HGB 10.9*  --  10.9*  HCT 35.6*  --  35.9*  PLT 208  --  194  CREATININE 1.27*  --  1.42*  TROPONINIHS 25* 29*  --     Estimated Creatinine Clearance: 60.3 mL/min (A) (by C-G formula based on SCr of 1.42 mg/dL (H)).   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 multivessel CAD by cath in 09/2017 and not felt to be a CABG candidate --> underwent two-vessel PCI with DES to the LAD and DES to the RCA   Diabetes mellitus    Diabetes mellitus, type II (HCC)    Dyspnea    Glaucoma    Hypertension    Left bundle branch block    Morbid obesity (HCC)    Obstructive sleep apnea    does not wear CPAP   Thyroid  disease     Assessment: 28 yof with dopplers now positive for RLE dvt. Patient is not on anticoagulants prior to admit. Hemoglobin stable in 10s, platelet count within normal limits. New orders to start apixaban.   Plan:  Apixaban 10mg  bid x 7 days then 5mg  bid   Audra Blend PharmD., BCPS Clinical Pharmacist 05/17/2024 12:06 PM

## 2024-05-17 NOTE — Plan of Care (Signed)
  Problem: Education: Goal: Knowledge of General Education information will improve Description: Including pain rating scale, medication(s)/side effects and non-pharmacologic comfort measures Outcome: Progressing   Problem: Education: Goal: Ability to demonstrate management of disease process will improve Outcome: Progressing   Problem: Fluid Volume: Goal: Ability to maintain a balanced intake and output will improve Outcome: Progressing

## 2024-05-17 NOTE — Hospital Course (Addendum)
 68 y.o. female,  with HFrEF 2/2 ischemic cardiomyopathy, hypothyroidism, CAD s/p PCI to the LAD and RCA , PAD, HTN, T2DM, OSA not on CPAP, CKD and morbid obesity/nonischemic cardiomyopathy with a EF 35%, history of pericardial effusion status post pericardial window.  Patient presents to ED secondary to complaints of shortness of breath, she denies any chest pain, fever, chills, reports shortness of breath progressive over the last week, but significant today which prompted her to come to ED, patient report she has not been taking her Lasix .  In ED chest x-ray significant for enlarged cardiac silhouette, possible pulmonary edema, she has elevated BNP, bedside ultrasound by ED physician with finding of small pericardial effusion, she was started on IV lasix , ED discussed with cardiology at [facility], who recommended admission to [facility] and to obtain echo and to diurese. The patient was started on IV furosemide  with slow clinical improvement.  Cardiology was consulted to assist with management.  Her serum creatinine rose significantly.  She was given furosemide  IV holiday with improvement of her renal function.  Her hospitalization was prolonged secondary to her fluctuating renal function.  Fortunately, the patient's fluid status remained stable and continued to improve gradually.  She was placed on apixaban  for her right lower extremity DVT.  Her Plavix  was discontinued temporarily until she finishes her 54-month course of apixaban  at which time she will restart her Plavix  with her aspirin . She was ultimately transitioned to oral furosemide .  Her renal function was monitored while on oral furosemide  and remained overall stable.  Therefore, she was discharged in stable condition with outpatient follow-up with cardiology.

## 2024-05-17 NOTE — Progress Notes (Signed)
*  PRELIMINARY RESULTS* Echocardiogram 2D Echocardiogram has been performed with Definity .  Bernis Brisker 05/17/2024, 3:20 PM

## 2024-05-17 NOTE — Plan of Care (Signed)
   Problem: Education: Goal: Knowledge of General Education information will improve Description: Including pain rating scale, medication(s)/side effects and non-pharmacologic comfort measures Outcome: Progressing   Problem: Clinical Measurements: Goal: Ability to maintain clinical measurements within normal limits will improve Outcome: Progressing Goal: Diagnostic test results will improve Outcome: Progressing

## 2024-05-17 NOTE — Discharge Instructions (Signed)
 Information on my medicine - ELIQUIS (apixaban)   Why was Eliquis prescribed for you? Eliquis was prescribed to treat blood clots that may have been found in the veins of your legs (deep vein thrombosis) or in your lungs (pulmonary embolism) and to reduce the risk of them occurring again.  What do You need to know about Eliquis ? The starting dose is 10 mg (two 5 mg tablets) taken TWICE daily for the FIRST SEVEN (7) DAYS, then on 05/24/24  the dose is reduced to ONE 5 mg tablet taken TWICE daily.  Eliquis may be taken with or without food.   Try to take the dose about the same time in the morning and in the evening. If you have difficulty swallowing the tablet whole please discuss with your pharmacist how to take the medication safely.  Take Eliquis exactly as prescribed and DO NOT stop taking Eliquis without talking to the doctor who prescribed the medication.  Stopping may increase your risk of developing a new blood clot.  Refill your prescription before you run out.  After discharge, you should have regular check-up appointments with your healthcare provider that is prescribing your Eliquis.    What do you do if you miss a dose? If a dose of ELIQUIS is not taken at the scheduled time, take it as soon as possible on the same day and twice-daily administration should be resumed. The dose should not be doubled to make up for a missed dose.  Important Safety Information A possible side effect of Eliquis is bleeding. You should call your healthcare provider right away if you experience any of the following: Bleeding from an injury or your nose that does not stop. Unusual colored urine (red or dark brown) or unusual colored stools (red or black). Unusual bruising for unknown reasons. A serious fall or if you hit your head (even if there is no bleeding).  Some medicines may interact with Eliquis and might increase your risk of bleeding or clotting while on Eliquis. To help avoid  this, consult your healthcare provider or pharmacist prior to using any new prescription or non-prescription medications, including herbals, vitamins, non-steroidal anti-inflammatory drugs (NSAIDs) and supplements.  This website has more information on Eliquis (apixaban): http://www.eliquis.com/eliquis/home

## 2024-05-17 NOTE — TOC Initial Note (Signed)
 Transition of Care [facility]) - Initial/Assessment Note    Patient Details  Name: Maureen Jackson MRN: [id number] Date of Birth: [birth year]  Transition of [facility]) CM/SW Contact:    Orelia Binet, RN Phone Number: 05/17/2024, 2:48 PM  Clinical Narrative:           Patient signed OBS letter, copy given. CM discussed PT recommendation for HHPT, she is agreeable and used [facility] in the past. She has a CAP aide 20 hours a week. She uses a rolling walker and has a cane. No other needs. TOC following to set up HHPT at discharge.         Expected Discharge Plan: Home w Home Health Services Barriers to Discharge: Continued Medical Work up   Patient Goals and CMS Choice Patient states their goals for this hospitalization and ongoing recovery are:: return home. CMS Medicare.gov Compare Post Acute Care list provided to:: Patient       Expected Discharge Plan and Services       Living arrangements for the past 2 months: Apartment         Prior Living Arrangements/Services Living arrangements for the past 2 months: Apartment Lives with:: Self   Do you feel safe going back to the place where you live?: Yes          Current home services: DME    Activities of Daily Living   ADL Screening (condition at time of admission) Independently performs ADLs?: Yes (appropriate for developmental age) Is the patient deaf or have difficulty hearing?: No Does the patient have difficulty seeing, even when wearing glasses/contacts?: No Does the patient have difficulty concentrating, remembering, or making decisions?: No  Permission Sought/Granted      Orientation: : Oriented to Self, Oriented to Place, Oriented to  Time, Oriented to Situation Alcohol / Substance Use: Not Applicable Psych Involvement: No (comment)  Admission diagnosis:  Acute on chronic systolic CHF (congestive heart failure) (HCC) [I50.23] Patient Active Problem List   Diagnosis Date Noted   Acute on chronic systolic CHF  (congestive heart failure) (HCC) 05/16/2024   Cardiac tamponade 12/21/2022   Acute on chronic combined systolic and diastolic CHF (congestive heart failure) (HCC) 12/20/2022   Acute on chronic combined systolic (congestive) and diastolic (congestive) heart failure (HCC) 05/18/2020   Acute respiratory disease due to COVID-19 virus 12/26/2019   Pressure injury of skin 09/21/2019   Drug rash    Abscess    Fistula    Fournier's gangrene in female [facility])    Chest pain 07/16/2018   Mixed hyperlipidemia 04/10/2018   TIA (transient ischemic attack) 03/14/2018   Vertigo 03/14/2018   Chronic combined systolic and diastolic CHF (congestive heart failure) (EF 30 to 35 %) 03/14/2018   Ischemic cardiomyopathy 11/06/2017   Coronary artery disease involving native coronary artery of native heart without angina pectoris    Pericardial effusion 09/07/2017   Acute combined systolic (congestive) and diastolic (congestive) heart failure (HCC) 09/06/2017   Cardiomegaly 09/05/2017   Morbid obesity/BMI > 55 03/04/2013   Labyrinthitis 03/04/2013   DM type 2 causing vascular disease (HCC) 03/04/2013   GASTRITIS 12/13/2006   HIATAL HERNIA, HX OF 12/13/2006   THYROIDECTOMY, HX OF 12/13/2006   Hypothyroidism 10/04/2006   TOBACCO ABUSE 10/04/2006   CARPAL TUNNEL SYNDROME 10/04/2006   Essential hypertension, benign 10/04/2006   GERD 10/04/2006   POSTMENOPAUSAL STATUS 10/04/2006   SKIN TAG 10/04/2006   KNEE PAIN, LEFT 10/04/2006   Sleep apnea 10/04/2006   LEG EDEMA 10/04/2006  PCP:  Veda Gerald, MD Pharmacy:   [facility] [phone] - [place], [place] - [address] AT SEC OF [address] & [address]  [place] [address] Phone: [phone] Fax: [phone]    Social Drivers of Health (SDOH) Social History: SDOH Screenings   Food Insecurity: No Food Insecurity (05/16/2024)  Housing: Low Risk  (05/16/2024)  Transportation Needs: No Transportation Needs (05/16/2024)   Utilities: Not At Risk (05/16/2024)  Alcohol Screen: Low Risk  (12/22/2022)  Depression (PHQ2-9): Low Risk  (01/01/2020)  Financial Resource Strain: Low Risk  (12/22/2022)  Social Connections: Unknown (05/16/2024)  Tobacco Use: Medium Risk (05/16/2024)   SDOH Interventions:    Readmission Risk Interventions    12/21/2022    9:38 AM  Readmission Risk Prevention Plan  Transportation Screening Complete  Home Care Screening Complete  Medication Review (RN CM) Complete

## 2024-05-17 NOTE — Progress Notes (Signed)
 PROGRESS NOTE   Maureen Jackson  UJW:[id number] DOB: 04/14/1956 DOA: 05/16/2024 PCP: Veda Gerald, MD   Chief Complaint  Patient presents with   Shortness of Breath   Level of care: Telemetry  Brief Admission History:  68 y.o. female,  with HFrEF 2/2 ischemic cardiomyopathy, hypothyroidism, CAD s/p PCI to the LAD and RCA , PAD, HTN, T2DM, OSA not on CPAP, CKD and morbid obesity/nonischemic cardiomyopathy with a EF 35%, history of pericardial effusion status post pericardial window.  Patient presents to ED secondary to complaints of shortness of breath, she denies any chest pain, fever, chills, reports shortness of breath progressive over the last week, but significant today which prompted her to come to ED, patient report she has not been taking her Lasix .  In ED chest x-ray significant for enlarged cardiac silhouette, possible pulmonary edema, she has elevated BNP, bedside ultrasound by ED physician with finding of small pericardial effusion, she was started on IV lasix , ED discussed with cardiology at [facility], who recommended admission to [facility] and to obtain echo and to diurese.   Assessment and Plan:  Acute on chronic systolic CHF Ischemic and nonischemic cardiomyopathy Pulmonary edema -pt admits she is not taking lasix  at home,  increased cardiac silhouette and elevated BNP. -Start on IV Lasix  60 mg IV twice daily, daily weights, strict ins and out-continue with G GDMT regimen including Entresto , Toprol -XL, Aldactone  and Jardiance . -Follow up 2D echo still pending to be done -repeat CXR in AM to follow edema   History of pericardial effusion s/p window - History of pericardial effusion, failed pericardiocentesis; s/p window in 12/2022 -Chest x-ray with increased cardiac silhouette, but bedside echo by ED physician with no evidence of large pleural effusion, as well she is status post pericardial window which should prevent recurrence of effusion - follow up repeat 2D  echo - Felt to be secondary to hypothyroidism which remains uncontrolled as evidenced by TSH 135.5.    DVT right post tibial vein Right leg pain  - pharm D dosed apixaban started 05/17/24  - continued plavix  for now given her CAD and stent - counseled patient regarding high bleeding risk  CAD - continue Lipitor , plavix , Toprol   - She denies any chest pain  - No chest pain.  - plan to discuss with cardiology if she needs to continue plavix    CKD 3A-3B -To monitor closely as on IV diuresis   Morbid obesity -Body mass index is 56.25 kg/m. -on Ozempic   Hypertension - Continue with home regimen   Diabetes mellitus, type II -She is on Jardiance  and Ozempic, continue with Jardiance , Ozempic is nonformulary, will add insulin  sliding scale CBG (last 3)  Recent Labs    05/17/24 1145  GLUCAP 159*    Hypothyroidism, post thyroidectomy - seems uncontrolled as evidenced by TSH 135  - add free T4 - pt says she takes levothyroxine  daily  - compliance is a concern as she freely admits she does not take furosemide  - she says she is followed by endocrinology and she is on 250 mcg of levothyroxine  daily which is the correct dose based on her weight of 256 mcg/day   Deconditioning - consult PT, OT   DVT prophylaxis: apixaban Code Status: Full  Family Communication:  Disposition:    Consultants:  cardio Procedures:   Antimicrobials:    Subjective: Pt reports she feels severely fatigued.  She says she has not been taking lasix  at home but is taking levothyroxine .   Objective:  Vitals:   05/16/24 2235 05/17/24 0341 05/17/24 0619 05/17/24 0817  BP: 118/80 130/83 122/84 (!) 128/92  Pulse: 72 72 74 79  Resp: 20 18    Temp: 98.2 F (36.8 C) 98.2 F (36.8 C) 97.9 F (36.6 C)   TempSrc: Oral Oral Oral   SpO2: 95% 94% 95%   Weight:  (!) 159.5 kg    Height:        Intake/Output Summary (Last 24 hours) at 05/17/2024 1242 Last data filed at 05/17/2024 0645 Gross per 24 hour   Intake --  Output 1200 ml  Net -1200 ml   Filed Weights   05/16/24 1506 05/16/24 1858 05/17/24 0341  Weight: (!) 153.3 kg (!) 159.6 kg (!) 159.5 kg   Examination:  General exam: Appears calm and comfortable  Respiratory system: Clear to auscultation. Respiratory effort normal. Cardiovascular system: normal S1 & S2 heard. No JVD, murmurs, rubs, gallops or clicks. No pedal edema. Gastrointestinal system: Abdomen is nondistended, soft and nontender. No organomegaly or masses felt. Normal bowel sounds heard. Central nervous system: Alert and oriented. No focal neurological deficits. Extremities: Symmetric 5 x 5 power. Skin: No rashes, lesions or ulcers. Psychiatry: Judgement and insight appear normal. Mood & affect appropriate.   Data Reviewed: I have personally reviewed following labs and imaging studies  CBC: Recent Labs  Lab 05/16/24 1536 05/17/24 0652  WBC 5.6 5.7  HGB 10.9* 10.9*  HCT 35.6* 35.9*  MCV 87.0 88.2  PLT 208 194    Basic Metabolic Panel: Recent Labs  Lab 05/16/24 1536 05/17/24 0652  NA 142 144  K 3.8 3.8  CL 108 108  CO2 26 27  GLUCOSE 132* 117*  BUN 19 21  CREATININE 1.27* 1.42*  CALCIUM  8.1* 7.9*    CBG: Recent Labs  Lab 05/17/24 1145  GLUCAP 159*    Recent Results (from the past 240 hours)  Resp panel by RT-PCR (RSV, Flu A&B, Covid) Anterior Nasal Swab     Status: None   Collection Time: 05/16/24  3:10 PM   Specimen: Anterior Nasal Swab  Result Value Ref Range Status   SARS Coronavirus 2 by RT PCR NEGATIVE NEGATIVE Final    Comment: (NOTE) SARS-CoV-2 target nucleic acids are NOT DETECTED.  The SARS-CoV-2 RNA is generally detectable in upper respiratory specimens during the acute phase of infection. The lowest concentration of SARS-CoV-2 viral copies this assay can detect is 138 copies/mL. A negative result does not preclude SARS-Cov-2 infection and should not be used as the sole basis for treatment or other patient management  decisions. A negative result may occur with  improper specimen collection/handling, submission of specimen other than nasopharyngeal swab, presence of viral mutation(s) within the areas targeted by this assay, and inadequate number of viral copies(<138 copies/mL). A negative result must be combined with clinical observations, patient history, and epidemiological information. The expected result is Negative.  Fact Sheet for Patients:  BloggerCourse.com  Fact Sheet for Healthcare Providers:  SeriousBroker.it  This test is no t yet approved or cleared by the [place]  FDA and  has been authorized for detection and/or diagnosis of SARS-CoV-2 by FDA under an Emergency Use Authorization (EUA). This EUA will remain  in effect (meaning this test can be used) for the duration of the COVID-19 declaration under Section 564(b)(1) of the Act, 21 U.S.C.section 360bbb-3(b)(1), unless the authorization is terminated  or revoked sooner.       Influenza A by PCR NEGATIVE NEGATIVE Final   Influenza B by  PCR NEGATIVE NEGATIVE Final    Comment: (NOTE) The Xpert Xpress SARS-CoV-2/FLU/RSV plus assay is intended as an aid in the diagnosis of influenza from Nasopharyngeal swab specimens and should not be used as a sole basis for treatment. Nasal washings and aspirates are unacceptable for Xpert Xpress SARS-CoV-2/FLU/RSV testing.  Fact Sheet for Patients: BloggerCourse.com  Fact Sheet for Healthcare Providers: SeriousBroker.it  This test is not yet approved or cleared by the [place]  FDA and has been authorized for detection and/or diagnosis of SARS-CoV-2 by FDA under an Emergency Use Authorization (EUA). This EUA will remain in effect (meaning this test can be used) for the duration of the COVID-19 declaration under Section 564(b)(1) of the Act, 21 U.S.C. section 360bbb-3(b)(1), unless the  authorization is terminated or revoked.     Resp Syncytial Virus by PCR NEGATIVE NEGATIVE Final    Comment: (NOTE) Fact Sheet for Patients: BloggerCourse.com  Fact Sheet for Healthcare Providers: SeriousBroker.it  This test is not yet approved or cleared by the [place]  FDA and has been authorized for detection and/or diagnosis of SARS-CoV-2 by FDA under an Emergency Use Authorization (EUA). This EUA will remain in effect (meaning this test can be used) for the duration of the COVID-19 declaration under Section 564(b)(1) of the Act, 21 U.S.C. section 360bbb-3(b)(1), unless the authorization is terminated or revoked.  Performed at [facility], [address], [place], [place] [address]      Radiology Studies: US  Venous Img Lower Bilateral (DVT) Result Date: 05/17/2024 CLINICAL DATA:  Lower extremity pain. EXAM: BILATERAL LOWER EXTREMITY VENOUS DOPPLER ULTRASOUND TECHNIQUE: Gray-scale sonography with graded compression, as well as color Doppler and duplex ultrasound were performed to evaluate the lower extremity deep venous systems from the level of the common femoral vein and including the common femoral, femoral, profunda femoral, popliteal and calf veins including the posterior tibial, peroneal and gastrocnemius veins when visible. The superficial great saphenous vein was also interrogated. Spectral Doppler was utilized to evaluate flow at rest and with distal augmentation maneuvers in the common femoral, femoral and popliteal veins. COMPARISON:  None Available. FINDINGS: RIGHT LOWER EXTREMITY Common Femoral Vein: No evidence of thrombus. Normal compressibility, respiratory phasicity and response to augmentation. Saphenofemoral Junction: No evidence of thrombus. Normal compressibility and flow on color Doppler imaging. Profunda Femoral Vein: No evidence of thrombus. Normal compressibility and flow on color Doppler imaging. Femoral Vein:  No evidence of thrombus. Normal compressibility, respiratory phasicity and response to augmentation. Popliteal Vein: No evidence of thrombus. Normal compressibility, respiratory phasicity and response to augmentation. Calf Veins: Absent color Doppler flow within the noncompressible right posterior tibial vein noted. Superficial Great Saphenous Vein: No evidence of thrombus. Normal compressibility. Venous Reflux:  None. Other Findings:  None. LEFT LOWER EXTREMITY Common Femoral Vein: No evidence of thrombus. Normal compressibility, respiratory phasicity and response to augmentation. Saphenofemoral Junction: No evidence of thrombus. Normal compressibility and flow on color Doppler imaging. Profunda Femoral Vein: No evidence of thrombus. Normal compressibility and flow on color Doppler imaging. Femoral Vein: No evidence of thrombus. Normal compressibility, respiratory phasicity and response to augmentation. Popliteal Vein: No evidence of thrombus. Normal compressibility, respiratory phasicity and response to augmentation. Calf Veins: No evidence of thrombus. Normal compressibility and flow on color Doppler imaging. Superficial Great Saphenous Vein: No evidence of thrombus. Normal compressibility. Venous Reflux:  None. Other Findings:  None. IMPRESSION: 1. Positive for deep vein thrombosis involving the right posterior tibial vein. 2. No evidence for deep vein thrombosis within the left lower extremity.  These results will be called to the ordering clinician or representative by the Radiologist Assistant, and communication documented in the PACS or [employer]. Electronically Signed   By: Kimberley Penman M.D.   On: 05/17/2024 11:15   DG Chest Port 1 View Result Date: 05/16/2024 CLINICAL DATA:  sob EXAM: PORTABLE CHEST - 1 VIEW COMPARISON:  October 08, 2023 FINDINGS: Diffuse bilateral perihilar interstitial opacities. Worsening enlargement of the cardiac silhouette. No acute fracture or destructive lesions.  Multilevel thoracic osteophytosis. IMPRESSION: 1. Diffuse bilateral perihilar interstitial opacities, likely reflecting interstitial edema. 2. Worsening enlargement of the cardiac silhouette, possibly from progressive cardiomegaly or superimposed pericardial effusion. Electronically Signed   By: Rance Burrows M.D.   On: 05/16/2024 15:52    Scheduled Meds:  apixaban  10 mg Oral BID   Followed by   Cecily Cohen ON 05/24/2024] apixaban  5 mg Oral BID   atorvastatin   20 mg Oral Daily   brimonidine   1 drop Both Eyes BID   And   timolol   1 drop Both Eyes BID   calcium  carbonate  4 tablet Oral BID WC   clopidogrel   75 mg Oral Daily   empagliflozin   25 mg Oral Daily   furosemide   60 mg Intravenous BID   insulin  aspart  0-15 Units Subcutaneous TID WC   levothyroxine   250 mcg Oral QAC breakfast   metoprolol  succinate  25 mg Oral Daily   potassium chloride   40 mEq Oral Daily   sacubitril -valsartan   1 tablet Oral BID   senna-docusate  2 tablet Oral BID   spironolactone   25 mg Oral Daily   Continuous Infusions:   LOS: 0 days   Time spent: 55 mins  Shironda Kain Lincoln Renshaw, MD How to contact the [facility] Attending or Consulting provider 7A - 7P or covering provider during after hours 7P -7A, for this patient?  Check the care team in [facility] and look for a) attending/consulting [facility] provider listed and b) the [facility] team listed Log into [email or web address] to find provider on call.  Locate the [facility] provider you are looking for under [facility] and page to a number that you can be directly reached. If you still have difficulty reaching the provider, please page the [facility] (Director on Call) for the Hospitalists listed on amion for assistance.  05/17/2024, 12:42 PM

## 2024-05-17 NOTE — Care Management Obs Status (Signed)
 MEDICARE OBSERVATION STATUS NOTIFICATION   Patient Details  Name: Maureen Jackson MRN: [id number] Date of Birth: 1956-11-01   Medicare Observation Status Notification Given:  Yes    Orelia Binet, RN 05/17/2024, 2:45 PM

## 2024-05-18 DIAGNOSIS — E782 Mixed hyperlipidemia: Secondary | ICD-10-CM | POA: Diagnosis not present

## 2024-05-18 DIAGNOSIS — K219 Gastro-esophageal reflux disease without esophagitis: Secondary | ICD-10-CM | POA: Diagnosis not present

## 2024-05-18 DIAGNOSIS — E039 Hypothyroidism, unspecified: Secondary | ICD-10-CM | POA: Diagnosis not present

## 2024-05-18 DIAGNOSIS — I5023 Acute on chronic systolic (congestive) heart failure: Secondary | ICD-10-CM | POA: Diagnosis not present

## 2024-05-18 LAB — BASIC METABOLIC PANEL WITH GFR
Anion gap: 7 (ref 5–15)
BUN: 26 mg/dL — ABNORMAL HIGH (ref 8–23)
CO2: 28 mmol/L (ref 22–32)
Calcium: 7.9 mg/dL — ABNORMAL LOW (ref 8.9–10.3)
Chloride: 106 mmol/L (ref 98–111)
Creatinine, Ser: 2.1 mg/dL — ABNORMAL HIGH (ref 0.44–1.00)
GFR, Estimated: 26 mL/min — ABNORMAL LOW (ref >60)
Glucose, Bld: 129 mg/dL — ABNORMAL HIGH (ref 70–99)
Potassium: 4.5 mmol/L (ref 3.5–5.1)
Sodium: 141 mmol/L (ref 135–145)

## 2024-05-18 LAB — GLUCOSE, CAPILLARY
Glucose-Capillary: 105 mg/dL — ABNORMAL HIGH (ref 70–99)
Glucose-Capillary: 138 mg/dL — ABNORMAL HIGH (ref 70–99)
Glucose-Capillary: 152 mg/dL — ABNORMAL HIGH (ref 70–99)
Glucose-Capillary: 149 mg/dL — ABNORMAL HIGH (ref 70–99)

## 2024-05-18 MED ORDER — FUROSEMIDE 10 MG/ML IJ SOLN: 40.0000 mg | Freq: Every day | INTRAMUSCULAR | Status: DC

## 2024-05-18 MED ORDER — SENNOSIDES-DOCUSATE SODIUM 8.6-50 MG PO TABS: 2.0000 | ORAL_TABLET | Freq: Every evening | ORAL | Status: DC | PRN

## 2024-05-18 MED ORDER — SACUBITRIL-VALSARTAN 97-103 MG PO TABS: 1.0000 | ORAL_TABLET | Freq: Two times a day (BID) | ORAL | Status: DC

## 2024-05-18 NOTE — Progress Notes (Signed)
 PROGRESS NOTE   Maureen Jackson  OZH:[id number] DOB: [birth year] DOA: 05/16/2024 PCP: Veda Gerald, MD   Chief Complaint  Patient presents with   Shortness of Breath   Level of care: Telemetry  Brief Admission History:  68 y.o. female,  with HFrEF 2/2 ischemic cardiomyopathy, hypothyroidism, CAD s/p PCI to the LAD and RCA , PAD, HTN, T2DM, OSA not on CPAP, CKD and morbid obesity/nonischemic cardiomyopathy with a EF 35%, history of pericardial effusion status post pericardial window.  Patient presents to ED secondary to complaints of shortness of breath, she denies any chest pain, fever, chills, reports shortness of breath progressive over the last week, but significant today which prompted her to come to ED, patient report she has not been taking her Lasix .  In ED chest x-ray significant for enlarged cardiac silhouette, possible pulmonary edema, she has elevated BNP, bedside ultrasound by ED physician with finding of small pericardial effusion, she was started on IV lasix , ED discussed with cardiology at [facility], who recommended admission to [facility] and to obtain echo and to diurese.   Assessment and Plan:  Acute on chronic HFrEF Ischemic and nonischemic cardiomyopathy Pulmonary edema -pt admits she is not taking lasix  at home because it causes frequent urination and she is mostly immobile.    -she was started on IV Lasix  60 mg IV twice daily, daily weights, strict ins and out -continue with G GDMT regimen including Entresto , Toprol -XL, Aldactone  and Jardiance . -Follow up 2D echo: no significant pericardial effusion, LVEF remains 30-35% with global hypokinesis, grade 1 DD, aortic valve sclerosis with no aortic valve stenosis. -repeat CXR in AM to follow edema -holding diuretics today due to bump in creatinine -hold potassium supplementation -recheck BMP in AM  -holding entresto  today due to bump in creatinine -pt reports dry weight is about 340#  -encouraged sodium  restriction 2 gm/day   History of pericardial effusion s/p window - History of pericardial effusion, failed pericardiocentesis; s/p window in 12/2022 - Chest x-ray with increased cardiac silhouette, but bedside echo by ED physician with no evidence of large pleural effusion, as well she is status post pericardial window which should prevent recurrence of effusion - follow up repeat 2D echo with report of small pericardial effusion present - outpatient providers felt to be secondary to hypothyroidism which remains uncontrolled as evidenced by TSH 135.5 and free T4<0.25.    DVT right post tibial vein Right leg pain  - suspect precipitated by prolonged immobility  - pharm D dosed apixaban started 05/17/24  - continued plavix  for now given her severe CAD and stents - counseled patient regarding high bleeding risk   CAD - outpatient cardiology records indicate she had LHC in 2018 demonstrating severe multivessel coronary artery disease. Patient underwent PCI as opposed to CABG with DES to the LAD, x 2 to the RCA and left circumflex disease managed medically.  - continue Lipitor , plavix , Toprol   - She denies any chest pain  - No chest pain.  - plan to discuss with cardiology about continuing plavix  in setting of new DVT and need to be on apixaban   AKI on CKD stage 3b - holding diuretics today - recheck BMP in AM    Morbid obesity -Body mass index is 56.25 kg/m. -pt reports she has been on Ozempic since 2023    Hypertension - Continue with home regimen   Diabetes mellitus, type II -She is on Jardiance  and Ozempic, continue with Jardiance , Ozempic is nonformulary, continue insulin   sliding scale CBG (last 3)  Recent Labs    05/17/24 2039 05/18/24 0724 05/18/24 1107  GLUCAP 155* 105* 138*   Hypothyroidism, post thyroidectomy - uncontrolled as evidenced by TSH 135  - checked free T4---<0.25  - pt insistent that she takes levothyroxine  daily - possibly not absorbing the drug but I  worry about compliance - compliance is a concern as she freely admits she does not take furosemide  - she says she is followed by endocrinology and she is on 250 mcg of levothyroxine  daily which is the correct dose based on her weight of 256 mcg/day and she says she has been on this dose for longer than 1 year - last visit with endocrinology was in May 2023: she was counseled regarding the correct intake of her thyroid  hormone, on empty stomach at fasting, with water, separated by at least 30 minutes from breakfast and other medications,  and separated by more than 4 hours from calcium , iron , multivitamins, acid reflux medications (PPIs).    Deconditioning - consult PT, OT  DVT prophylaxis: apixaban Code Status: Full  Family Communication:  Disposition: TBD    Consultants:  cardiology  Procedures:   Antimicrobials:    Subjective: Pt continues to complain of shortness of breath and fatigue.    Objective: Vitals:   05/17/24 2037 05/18/24 0547 05/18/24 1032 05/18/24 1250  BP: 113/77 112/66 123/79 108/67  Pulse: 69 68 72 72  Resp: 20 20  16   Temp: 98.8 F (37.1 C) 98.2 F (36.8 C)  98.9 F (37.2 C)  TempSrc: Oral Oral  Oral  SpO2: 91% 92%  93%  Weight:  (!) 158.8 kg    Height:        Intake/Output Summary (Last 24 hours) at 05/18/2024 1559 Last data filed at 05/18/2024 1100 Gross per 24 hour  Intake 480 ml  Output 400 ml  Net 80 ml   Filed Weights   05/16/24 1858 05/17/24 0341 05/18/24 0547  Weight: (!) 159.6 kg (!) 159.5 kg (!) 158.8 kg   Examination:  General exam: morbidly obese female, NAD, Appears calm but Uncomfortable  Respiratory system: Clear to auscultation. Respiratory effort normal. Cardiovascular system: normal S1 & S2 heard. No JVD, murmurs, rubs, gallops or clicks. No pedal edema. Gastrointestinal system: Abdomen is nondistended, soft and nontender. No organomegaly or masses felt. Normal bowel sounds heard. Central nervous system: Alert and oriented. No  focal neurological deficits. Extremities: Symmetric 5 x 5 power. Skin: No rashes, lesions or ulcers. Psychiatry: Judgement and insight appear normal. Mood & affect appropriate.   Data Reviewed: I have personally reviewed following labs and imaging studies  CBC: Recent Labs  Lab 05/16/24 1536 05/17/24 0652  WBC 5.6 5.7  HGB 10.9* 10.9*  HCT 35.6* 35.9*  MCV 87.0 88.2  PLT 208 194    Basic Metabolic Panel: Recent Labs  Lab 05/16/24 1536 05/17/24 0652 05/18/24 0255  NA 142 144 141  K 3.8 3.8 4.5  CL 108 108 106  CO2 26 27 28   GLUCOSE 132* 117* 129*  BUN 19 21 26*  CREATININE 1.27* 1.42* 2.10*  CALCIUM  8.1* 7.9* 7.9*    CBG: Recent Labs  Lab 05/17/24 1145 05/17/24 1619 05/17/24 2039 05/18/24 0724 05/18/24 1107  GLUCAP 159* 172* 155* 105* 138*    Recent Results (from the past 240 hours)  Resp panel by RT-PCR (RSV, Flu A&B, Covid) Anterior Nasal Swab     Status: None   Collection Time: 05/16/24  3:10 PM   Specimen:  Anterior Nasal Swab  Result Value Ref Range Status   SARS Coronavirus 2 by RT PCR NEGATIVE NEGATIVE Final    Comment: (NOTE) SARS-CoV-2 target nucleic acids are NOT DETECTED.  The SARS-CoV-2 RNA is generally detectable in upper respiratory specimens during the acute phase of infection. The lowest concentration of SARS-CoV-2 viral copies this assay can detect is 138 copies/mL. A negative result does not preclude SARS-Cov-2 infection and should not be used as the sole basis for treatment or other patient management decisions. A negative result may occur with  improper specimen collection/handling, submission of specimen other than nasopharyngeal swab, presence of viral mutation(s) within the areas targeted by this assay, and inadequate number of viral copies(<138 copies/mL). A negative result must be combined with clinical observations, patient history, and epidemiological information. The expected result is Negative.  Fact Sheet for Patients:   BloggerCourse.com  Fact Sheet for Healthcare Providers:  SeriousBroker.it  This test is no t yet approved or cleared by the [place]  FDA and  has been authorized for detection and/or diagnosis of SARS-CoV-2 by FDA under an Emergency Use Authorization (EUA). This EUA will remain  in effect (meaning this test can be used) for the duration of the COVID-19 declaration under Section 564(b)(1) of the Act, 21 U.S.C.section 360bbb-3(b)(1), unless the authorization is terminated  or revoked sooner.       Influenza A by PCR NEGATIVE NEGATIVE Final   Influenza B by PCR NEGATIVE NEGATIVE Final    Comment: (NOTE) The Xpert Xpress SARS-CoV-2/FLU/RSV plus assay is intended as an aid in the diagnosis of influenza from Nasopharyngeal swab specimens and should not be used as a sole basis for treatment. Nasal washings and aspirates are unacceptable for Xpert Xpress SARS-CoV-2/FLU/RSV testing.  Fact Sheet for Patients: BloggerCourse.com  Fact Sheet for Healthcare Providers: SeriousBroker.it  This test is not yet approved or cleared by the [place]  FDA and has been authorized for detection and/or diagnosis of SARS-CoV-2 by FDA under an Emergency Use Authorization (EUA). This EUA will remain in effect (meaning this test can be used) for the duration of the COVID-19 declaration under Section 564(b)(1) of the Act, 21 U.S.C. section 360bbb-3(b)(1), unless the authorization is terminated or revoked.     Resp Syncytial Virus by PCR NEGATIVE NEGATIVE Final    Comment: (NOTE) Fact Sheet for Patients: BloggerCourse.com  Fact Sheet for Healthcare Providers: SeriousBroker.it  This test is not yet approved or cleared by the [place]  FDA and has been authorized for detection and/or diagnosis of SARS-CoV-2 by FDA under an Emergency Use  Authorization (EUA). This EUA will remain in effect (meaning this test can be used) for the duration of the COVID-19 declaration under Section 564(b)(1) of the Act, 21 U.S.C. section 360bbb-3(b)(1), unless the authorization is terminated or revoked.  Performed at [facility], [address], [place], [place] [address]      Radiology Studies: ECHOCARDIOGRAM COMPLETE Result Date: 05/17/2024    ECHOCARDIOGRAM REPORT   Patient Name:   Maureen Jackson Date of Exam: 05/17/2024 Medical Rec #:  [id number]       Height:       65.0 in Accession #:    [id number]      Weight:       351.6 lb Date of Birth:  [birth year]        BSA:          2.514 m Patient Age:    68 years        BP:  128/92 mmHg Patient Gender: F               HR:           79 bpm. Exam Location:  [facility] Procedure: 2D Echo, Cardiac Doppler, Color Doppler and Intracardiac            Opacification Agent (Both Spectral and Color Flow Doppler were            utilized during procedure). Indications:    Pericardial effusion I31.3  History:        Patient has prior history of Echocardiogram examinations, most                 recent 08/20/2023. CHF and Cardiomyopathy, CAD, TIA; Risk                 Factors:Hypertension, Diabetes, Dyslipidemia and Sleep Apnea.                 Morbid Obesity.  Sonographer:    Denese Finn RCS Referring Phys: [id number] [place]  Sonographer Comments: Technically difficult study due to poor echo windows and suboptimal subcostal window. IMPRESSIONS  1. Left ventricular ejection fraction, by estimation, is 30 to 35%. The left ventricle has moderately decreased function. The left ventricle demonstrates global hypokinesis. The left ventricular internal cavity size was moderately dilated. There is moderate left ventricular hypertrophy. Left ventricular diastolic parameters are consistent with Grade I diastolic dysfunction (impaired relaxation).  2. Right ventricular systolic function is normal. The right ventricular size  is normal.  3. Left atrial size was mildly dilated.  4. A small pericardial effusion is present. The pericardial effusion is posterior to the left ventricle and anterior to the right ventricle.  5. The mitral valve is abnormal. No evidence of mitral valve regurgitation. No evidence of mitral stenosis. Moderate mitral annular calcification.  6. The aortic valve is tricuspid. There is mild calcification of the aortic valve. There is mild thickening of the aortic valve. Aortic valve regurgitation is not visualized. Aortic valve sclerosis is present, with no evidence of aortic valve stenosis.  7. The inferior vena cava is normal in size with greater than 50% respiratory variability, suggesting right atrial pressure of 3 mmHg. FINDINGS  Left Ventricle: Left ventricular ejection fraction, by estimation, is 30 to 35%. The left ventricle has moderately decreased function. The left ventricle demonstrates global hypokinesis. Definity  contrast agent was given IV to delineate the left ventricular endocardial borders. Strain was performed and the global longitudinal strain is indeterminate. The left ventricular internal cavity size was moderately dilated. There is moderate left ventricular hypertrophy. Left ventricular diastolic parameters are consistent with Grade I diastolic dysfunction (impaired relaxation). Right Ventricle: The right ventricular size is normal. No increase in right ventricular wall thickness. Right ventricular systolic function is normal. Left Atrium: Left atrial size was mildly dilated. Right Atrium: Right atrial size was normal in size. Pericardium: A small pericardial effusion is present. The pericardial effusion is posterior to the left ventricle and anterior to the right ventricle. Mitral Valve: The mitral valve is abnormal. There is mild thickening of the mitral valve leaflet(s). There is mild calcification of the mitral valve leaflet(s). Moderate mitral annular calcification. No evidence of mitral  valve regurgitation. No evidence  of mitral valve stenosis. Tricuspid Valve: The tricuspid valve is normal in structure. Tricuspid valve regurgitation is not demonstrated. No evidence of tricuspid stenosis. Aortic Valve: The aortic valve is tricuspid. There is mild calcification of the aortic valve. There  is mild thickening of the aortic valve. Aortic valve regurgitation is not visualized. Aortic valve sclerosis is present, with no evidence of aortic valve stenosis. Pulmonic Valve: The pulmonic valve was normal in structure. Pulmonic valve regurgitation is trivial. No evidence of pulmonic stenosis. Aorta: The aortic root is normal in size and structure. Venous: The inferior vena cava is normal in size with greater than 50% respiratory variability, suggesting right atrial pressure of 3 mmHg. IAS/Shunts: No atrial level shunt detected by color flow Doppler. Additional Comments: 3D was performed not requiring image post processing on an independent workstation and was indeterminate.  LEFT VENTRICLE PLAX 2D LVIDd:         6.00 cm   Diastology LVIDs:         4.60 cm   LV e' medial:   3.70 cm/s LV PW:         1.50 cm   LV E/e' medial: 13.0 LV IVS:        1.40 cm LVOT diam:     1.80 cm LV SV:         25 LV SV Index:   10 LVOT Area:     2.54 cm  RIGHT VENTRICLE RV S prime:     5.96 cm/s LEFT ATRIUM             Index LA diam:        4.10 cm 1.63 cm/m LA Vol (A2C):   80.4 ml 31.98 ml/m LA Vol (A4C):   76.1 ml 30.27 ml/m LA Biplane Vol: 78.8 ml 31.34 ml/m  AORTIC VALVE LVOT Vmax:   65.57 cm/s LVOT Vmean:  45.700 cm/s LVOT VTI:    0.097 m  AORTA Ao Root diam: 3.50 cm MITRAL VALVE MV Area (PHT): 2.81 cm    SHUNTS MV Decel Time: 270 msec    Systemic VTI:  0.10 m MV E velocity: 48.05 cm/s  Systemic Diam: 1.80 cm MV A velocity: 68.55 cm/s MV E/A ratio:  0.70 Janelle Mediate MD Electronically signed by Janelle Mediate MD Signature Date/Time: 05/17/2024/3:24:41 PM    Final    US  Venous Img Lower Bilateral (DVT) Result Date:  05/17/2024 CLINICAL DATA:  Lower extremity pain. EXAM: BILATERAL LOWER EXTREMITY VENOUS DOPPLER ULTRASOUND TECHNIQUE: Gray-scale sonography with graded compression, as well as color Doppler and duplex ultrasound were performed to evaluate the lower extremity deep venous systems from the level of the common femoral vein and including the common femoral, femoral, profunda femoral, popliteal and calf veins including the posterior tibial, peroneal and gastrocnemius veins when visible. The superficial great saphenous vein was also interrogated. Spectral Doppler was utilized to evaluate flow at rest and with distal augmentation maneuvers in the common femoral, femoral and popliteal veins. COMPARISON:  None Available. FINDINGS: RIGHT LOWER EXTREMITY Common Femoral Vein: No evidence of thrombus. Normal compressibility, respiratory phasicity and response to augmentation. Saphenofemoral Junction: No evidence of thrombus. Normal compressibility and flow on color Doppler imaging. Profunda Femoral Vein: No evidence of thrombus. Normal compressibility and flow on color Doppler imaging. Femoral Vein: No evidence of thrombus. Normal compressibility, respiratory phasicity and response to augmentation. Popliteal Vein: No evidence of thrombus. Normal compressibility, respiratory phasicity and response to augmentation. Calf Veins: Absent color Doppler flow within the noncompressible right posterior tibial vein noted. Superficial Great Saphenous Vein: No evidence of thrombus. Normal compressibility. Venous Reflux:  None. Other Findings:  None. LEFT LOWER EXTREMITY Common Femoral Vein: No evidence of thrombus. Normal compressibility, respiratory phasicity and response to augmentation. Saphenofemoral Junction:  No evidence of thrombus. Normal compressibility and flow on color Doppler imaging. Profunda Femoral Vein: No evidence of thrombus. Normal compressibility and flow on color Doppler imaging. Femoral Vein: No evidence of thrombus.  Normal compressibility, respiratory phasicity and response to augmentation. Popliteal Vein: No evidence of thrombus. Normal compressibility, respiratory phasicity and response to augmentation. Calf Veins: No evidence of thrombus. Normal compressibility and flow on color Doppler imaging. Superficial Great Saphenous Vein: No evidence of thrombus. Normal compressibility. Venous Reflux:  None. Other Findings:  None. IMPRESSION: 1. Positive for deep vein thrombosis involving the right posterior tibial vein. 2. No evidence for deep vein thrombosis within the left lower extremity. These results will be called to the ordering clinician or representative by the Radiologist Assistant, and communication documented in the PACS or [employer]. Electronically Signed   By: Kimberley Penman M.D.   On: 05/17/2024 11:15    Scheduled Meds:  apixaban  10 mg Oral BID   Followed by   Cecily Cohen ON 05/24/2024] apixaban  5 mg Oral BID   atorvastatin   20 mg Oral Daily   brimonidine   1 drop Both Eyes BID   And   timolol   1 drop Both Eyes BID   calcium  carbonate  4 tablet Oral BID WC   clopidogrel   75 mg Oral Daily   insulin  aspart  0-15 Units Subcutaneous TID WC   levothyroxine   250 mcg Oral QAC breakfast   metoprolol  succinate  25 mg Oral Daily   potassium chloride   40 mEq Oral Daily   [START ON 05/19/2024] sacubitril -valsartan   1 tablet Oral BID   senna-docusate  2 tablet Oral BID   spironolactone   25 mg Oral Daily   Continuous Infusions:   LOS: 0 days   Time spent: 55 mins  Vennie Waymire Lincoln Renshaw, MD How to contact the [facility] Attending or Consulting provider 7A - 7P or covering provider during after hours 7P -7A, for this patient?  Check the care team in [facility] and look for a) attending/consulting [facility] provider listed and b) the [facility] team listed Log into [email or web address] to find provider on call.  Locate the [facility] provider you are looking for under [facility] and page to a number that you can be directly reached. If you  still have difficulty reaching the provider, please page the [facility] (Director on Call) for the Hospitalists listed on amion for assistance.  05/18/2024, 3:59 PM

## 2024-05-18 NOTE — Plan of Care (Signed)
  Problem: Education: Goal: Knowledge of General Education information will improve Description: Including pain rating scale, medication(s)/side effects and non-pharmacologic comfort measures Outcome: Progressing   Problem: Clinical Measurements: Goal: Diagnostic test results will improve Outcome: Progressing   Problem: Activity: Goal: Risk for activity intolerance will decrease Outcome: Progressing   Problem: Coping: Goal: Level of anxiety will decrease Outcome: Progressing

## 2024-05-18 NOTE — Progress Notes (Incomplete)
 Consult for HF to see 6/9

## 2024-05-19 ENCOUNTER — Telehealth (HOSPITAL_COMMUNITY): Payer: Self-pay | Admitting: Pharmacy Technician

## 2024-05-19 ENCOUNTER — Observation Stay (HOSPITAL_COMMUNITY)

## 2024-05-19 ENCOUNTER — Encounter (HOSPITAL_COMMUNITY): Payer: Self-pay | Admitting: Internal Medicine

## 2024-05-19 ENCOUNTER — Other Ambulatory Visit (HOSPITAL_COMMUNITY): Payer: Self-pay

## 2024-05-19 DIAGNOSIS — I5023 Acute on chronic systolic (congestive) heart failure: Secondary | ICD-10-CM | POA: Diagnosis not present

## 2024-05-19 DIAGNOSIS — I255 Ischemic cardiomyopathy: Secondary | ICD-10-CM

## 2024-05-19 DIAGNOSIS — I5020 Unspecified systolic (congestive) heart failure: Secondary | ICD-10-CM | POA: Diagnosis not present

## 2024-05-19 DIAGNOSIS — I447 Left bundle-branch block, unspecified: Secondary | ICD-10-CM

## 2024-05-19 DIAGNOSIS — I10 Essential (primary) hypertension: Secondary | ICD-10-CM

## 2024-05-19 DIAGNOSIS — J811 Chronic pulmonary edema: Secondary | ICD-10-CM | POA: Diagnosis not present

## 2024-05-19 DIAGNOSIS — N1832 Chronic kidney disease, stage 3b: Secondary | ICD-10-CM

## 2024-05-19 DIAGNOSIS — I2510 Atherosclerotic heart disease of native coronary artery without angina pectoris: Secondary | ICD-10-CM | POA: Diagnosis not present

## 2024-05-19 DIAGNOSIS — I3139 Other pericardial effusion (noninflammatory): Secondary | ICD-10-CM

## 2024-05-19 DIAGNOSIS — I517 Cardiomegaly: Secondary | ICD-10-CM

## 2024-05-19 DIAGNOSIS — N179 Acute kidney failure, unspecified: Secondary | ICD-10-CM

## 2024-05-19 LAB — BASIC METABOLIC PANEL WITH GFR
Anion gap: 11 (ref 5–15)
Anion gap: 7 (ref 5–15)
BUN: 35 mg/dL — ABNORMAL HIGH (ref 8–23)
BUN: 35 mg/dL — ABNORMAL HIGH (ref 8–23)
CO2: 24 mmol/L (ref 22–32)
CO2: 27 mmol/L (ref 22–32)
Calcium: 8.1 mg/dL — ABNORMAL LOW (ref 8.9–10.3)
Calcium: 8.2 mg/dL — ABNORMAL LOW (ref 8.9–10.3)
Chloride: 106 mmol/L (ref 98–111)
Chloride: 106 mmol/L (ref 98–111)
Creatinine, Ser: 2.32 mg/dL — ABNORMAL HIGH (ref 0.44–1.00)
Creatinine, Ser: 2.33 mg/dL — ABNORMAL HIGH (ref 0.44–1.00)
GFR, Estimated: 23 mL/min — ABNORMAL LOW (ref >60)
GFR, Estimated: 23 mL/min — ABNORMAL LOW (ref >60)
Glucose, Bld: 136 mg/dL — ABNORMAL HIGH (ref 70–99)
Glucose, Bld: 144 mg/dL — ABNORMAL HIGH (ref 70–99)
Potassium: 4.7 mmol/L (ref 3.5–5.1)
Potassium: 4.5 mmol/L (ref 3.5–5.1)
Sodium: 141 mmol/L (ref 135–145)
Sodium: 140 mmol/L (ref 135–145)

## 2024-05-19 LAB — CBC
HCT: 34.8 % — ABNORMAL LOW (ref 36.0–46.0)
Hemoglobin: 10.6 g/dL — ABNORMAL LOW (ref 12.0–15.0)
MCH: 27.9 pg (ref 26.0–34.0)
MCHC: 30.5 g/dL (ref 30.0–36.0)
MCV: 91.6 fL (ref 80.0–100.0)
Platelets: 217 10*3/uL (ref 150–400)
RBC: 3.8 MIL/uL — ABNORMAL LOW (ref 3.87–5.11)
RDW: 15.2 % (ref 11.5–15.5)
WBC: 6.8 10*3/uL (ref 4.0–10.5)
nRBC: 0 % (ref 0.0–0.2)

## 2024-05-19 LAB — GLUCOSE, CAPILLARY
Glucose-Capillary: 134 mg/dL — ABNORMAL HIGH (ref 70–99)
Glucose-Capillary: 130 mg/dL — ABNORMAL HIGH (ref 70–99)
Glucose-Capillary: 126 mg/dL — ABNORMAL HIGH (ref 70–99)
Glucose-Capillary: 122 mg/dL — ABNORMAL HIGH (ref 70–99)

## 2024-05-19 LAB — HEMOGLOBIN A1C
Hgb A1c MFr Bld: 7.4 % — ABNORMAL HIGH (ref 4.8–5.6)
Mean Plasma Glucose: 166 mg/dL

## 2024-05-19 LAB — MAGNESIUM: Magnesium: 2.1 mg/dL (ref 1.7–2.4)

## 2024-05-19 MED ORDER — ASPIRIN 81 MG PO TBEC
81.0000 mg | DELAYED_RELEASE_TABLET | Freq: Every day | ORAL | Status: DC
  Administered 2024-05-19 – 2024-05-25 (×7): 81 mg via ORAL
  Filled 2024-05-19 (×7): qty 1

## 2024-05-19 MED ORDER — OXYCODONE HCL 5 MG PO TABS
5.0000 mg | ORAL_TABLET | ORAL | Status: DC | PRN
  Administered 2024-05-20 – 2024-05-22 (×4): 5 mg via ORAL
  Filled 2024-05-19 (×5): qty 1

## 2024-05-19 MED ORDER — FENTANYL CITRATE PF 50 MCG/ML IJ SOSY: 25.0000 ug | PREFILLED_SYRINGE | INTRAMUSCULAR | Status: DC | PRN

## 2024-05-19 MED ORDER — SACUBITRIL-VALSARTAN 49-51 MG PO TABS
1.0000 | ORAL_TABLET | Freq: Two times a day (BID) | ORAL | Status: DC
  Administered 2024-05-19 – 2024-05-25 (×12): 1 via ORAL
  Filled 2024-05-19 (×15): qty 1

## 2024-05-19 NOTE — Consult Note (Addendum)
 Cardiology Consultation   Patient ID: Maureen Jackson MRN: [id number]; DOB: Mar 03, 1956  Admit date: 05/16/2024 Date of Consult: 05/19/2024  PCP:  Veda Gerald, MD   [facility] Providers Cardiologist:  Armida Lander, MD   Patient Profile: Maureen Jackson is a 68 y.o. female with a hx of pericardial effusion s/p window in 12/2022, HFrEF 2/2 ischemic cardiomyopathy, CAD (s/p 2018 DES to LAD, RCA x2, and medically managed LCX as opposed to CABG), PAD, HTN, T2DM, OSA not on CPAP, CKD and morbid obesity  who is being seen 05/19/2024 for the evaluation of HF at the request of Dr. Lincoln Renshaw.  History of Present Illness: Maureen Jackson was last seen by advanced HF in [facility] 12/11/2023.  At that time, noted fatigue, SOB with exertion, and increased weight since Christmas.  Reported high sodium diet. Noted medication noncompliance.  Lasix  was increased to 80 mg x 6 days then return to 60 mg daily.  Continued on Lipitor  80 mg, Plavix  75 mg, Jardiance  25 mg twice daily, Toprol  XL 25 mg, Entresto  97/103 mg twice daily, and spironolactone  25 mg.   Presented to [facility] ED 6/6 for concerns of SOB. Cr 1.26 now increased to 2.1 with IV Lasix .  Albumin  3.1, AST 42, BNP 141 >94 (11/2023: 146), Tn 22 > 35 >25 > 29, chronic hemoglobin 10.9, TSH 135 (01/2023: 26), T4 0.25, negative respiratory panel.  EKG, NSR, HR 79, chronic LBBB (similar to previous)  CXR: Enlarged cardiac silhouette and diffuse bilateral perihilar interstitial opacities  Repeat CXR: improvement in interstitial edema, enlarged heart  ED treated with IV Lasix .   LE US  05/17/2024: + DVT in right posterior tibial vein, - DVT in left LE Echo 05/17/2024: EF 30 to 35%, moderately decreased LV function, global hypokinesis, moderately dilated LV cavity, G1 DD, mildly dilated LA, small pericardial effusion posterior to the LV and anterior to the RV, moderate mitral annular calcification, mild calcification and thickening of AV without AS  On interview,  reported progressively worsening SOB X 1 week.  SOB noted with minimal exertion, relieved with rest. Reported LE edema x 1 -2 weeks. Noted legs continuing to swelling even with elevation. Denies any chest pain, dizziness, syncope. Noted taking noncompliance Lasix  every other day or two day due to excessive urination.  Also noted noncompliance with other medication occasionally. Reports poor dietary diet mainly fast food. Fluid intake of less 2 L per day including 16 oz soda x 1 - 2, 1-2 water.  Stays at home alone and able to do ADL.   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 multivessel CAD by cath in 09/2017 and not felt to be a CABG candidate --> underwent two-vessel PCI with DES to the LAD and DES to the RCA   Diabetes mellitus    Diabetes mellitus, type II (HCC)    Dyspnea    Glaucoma    Hypertension    Left bundle branch block    Morbid obesity (HCC)    Obstructive sleep apnea    does not wear CPAP   Thyroid  disease     Past Surgical History:  Procedure Laterality Date   ABDOMINAL HYSTERECTOMY     CARDIAC CATHETERIZATION  09/12/2017   CORONARY STENT INTERVENTION  09/12/2017  STENT RESOLUTE ONYX [id number] drug eluting stent was successfully placed   CORONARY STENT INTERVENTION N/A 09/12/2017   Procedure: CORONARY STENT INTERVENTION;  Surgeon: Arleen Lacer, MD;  Location: [facility] INVASIVE CV LAB;  Service: Cardiovascular;  Laterality: N/A;   INCISION AND DRAINAGE PERIRECTAL ABSCESS Left 09/16/2019   Procedure: IRRIGATION AND DEBRIDEMENT LABIA ABSCESS;  Surgeon: Oza Blumenthal, MD;  Location: [facility] OR;  Service: General;  Laterality: Left;   IR FLUORO GUIDE CV LINE RIGHT  09/16/2019   IR US  GUIDE VASC ACCESS RIGHT  09/16/2019   LEFT HEART CATH AND CORONARY ANGIOGRAPHY N/A 09/12/2017   Procedure: LEFT  HEART CATH AND CORONARY ANGIOGRAPHY;  Surgeon: Arleen Lacer, MD;  Location: [facility] INVASIVE CV LAB;  Service: Cardiovascular;  Laterality: N/A;   MASS EXCISION N/A 12/18/2018   Procedure: EXCISION TONGUE MASS;  Surgeon: Reynold Caves, MD;  Location: [facility] OR;  Service: ENT;  Laterality: N/A;   PERICARDIOCENTESIS N/A 12/22/2022   Procedure: PERICARDIOCENTESIS;  Surgeon: Kyra Phy, MD;  Location: [facility] INVASIVE CV LAB;  Service: Cardiovascular;  Laterality: N/A;   RIGHT/LEFT HEART CATH AND CORONARY ANGIOGRAPHY N/A 09/10/2017   Procedure: RIGHT/LEFT HEART CATH AND CORONARY ANGIOGRAPHY;  Surgeon: Millicent Ally, MD;  Location: [facility] INVASIVE CV LAB;  Service: Cardiovascular;  Laterality: N/A;   SUBXYPHOID PERICARDIAL WINDOW N/A 12/23/2022   Procedure: SUBXYPHOID PERICARDIAL WINDOW;  Surgeon: Melene Sportsman, MD;  Location: [facility] OR;  Service: Thoracic;  Laterality: N/A;   TEE WITHOUT CARDIOVERSION N/A 12/23/2022   Procedure: TRANSESOPHAGEAL ECHOCARDIOGRAM (TEE);  Surgeon: Melene Sportsman, MD;  Location: [facility] OR;  Service: Thoracic;  Laterality: N/A;   THYROID  SURGERY       Home Medications:  Prior to Admission medications   Medication Sig Start Date End Date Taking? Authorizing Provider  acetaminophen  (TYLENOL ) 325 MG tablet Take 2 tablets (650 mg total) by mouth every 4 (four) hours as needed for mild pain, fever or headache. 05/25/20  Yes Emokpae, Courage, MD  atorvastatin  (LIPITOR ) 80 MG tablet TAKE 1 TABLET(80 MG) BY MOUTH DAILY 04/23/23  Yes Branch, Joyceann No, MD  calcium  carbonate (TUMS - DOSED IN MG ELEMENTAL CALCIUM ) 500 MG chewable tablet Chew 4 tablets by mouth 2 (two) times daily.   Yes [provider]  cetirizine  (ZYRTEC ) 10 MG tablet Take 10 mg by mouth daily as needed for allergies.  09/12/18  Yes [provider]  clopidogrel  (PLAVIX ) 75 MG tablet TAKE 1 TABLET(75 MG) BY MOUTH DAILY 01/10/24  Yes Branch, Joyceann No, MD  COMBIGAN  0.2-0.5 % ophthalmic solution Place 1 drop into both  eyes 2 (two) times daily. 02/08/22  Yes [provider]  furosemide  (LASIX ) 40 MG tablet Take 1.5 tablets (60 mg total) by mouth daily. 12/11/23  Yes Clegg, Amy D, NP  insulin  aspart (NOVOLOG ) 100 UNIT/ML injection Inject 0-15 Units into the skin 3 (three) times daily with meals. 01/02/23  Yes Wynetta Heckle, MD  JARDIANCE  25 MG TABS tablet Take 25 mg by mouth daily. 08/02/22  Yes [provider]  levothyroxine  (SYNTHROID ) 200 MCG tablet Take 200 mcg by mouth daily before breakfast. Take 1 tablet with 50mcg for a total dose of 250mcg daily   Yes [provider]  levothyroxine  (SYNTHROID ) 50 MCG tablet Take 50 mcg by mouth daily before breakfast. Take 1 tablet with 200mcg for a total dose of 250mcg daily   Yes [provider]  metoprolol  succinate (TOPROL -XL) 25 MG 24 hr tablet TAKE 1 TABLET(25 MG) BY MOUTH DAILY 02/18/24  Yes Lasalle Pointer, NP  OZEMPIC, 0.25 OR 0.5 MG/DOSE, 2 MG/3ML SOPN Inject 0.5 mg into the skin once a week. Fridays 05/03/23  Yes [provider]  sacubitril -valsartan  (ENTRESTO ) 97-103 MG Take 1 tablet by mouth 2 (two) times daily. 11/20/23  Yes Sabharwal, Aditya, DO  senna-docusate (SENOKOT-S) 8.6-50 MG tablet Take 2 tablets by mouth 2 (two) times daily. Patient taking differently: Take 2 tablets by mouth at bedtime as needed for mild constipation. 05/25/20  Yes Emokpae, Courage, MD  spironolactone  (ALDACTONE ) 25 MG tablet TAKE 1 TABLET(25 MG) BY MOUTH DAILY 09/03/23  Yes Lasalle Pointer, NP  VENTOLIN  HFA 108 (90 Base) MCG/ACT inhaler Inhale 1-2 puffs into the lungs every 6 (six) hours as needed for wheezing or shortness of breath. 05/25/20  Yes Colin Dawley, MD  Blood Pressure Monitoring (OMRON 3 SERIES BP MONITOR) DEVI Use as directed 08/27/23   Lasalle Pointer, NP  glucose blood (ONETOUCH VERIO) test strip Test glucose 4 times day. 04/19/22   Nida, Gebreselassie W, MD    Scheduled Meds:  apixaban  10 mg Oral BID   Followed by   Cecily Cohen  ON 05/24/2024] apixaban  5 mg Oral BID   atorvastatin   20 mg Oral Daily   brimonidine   1 drop Both Eyes BID   And   timolol   1 drop Both Eyes BID   calcium  carbonate  4 tablet Oral BID WC   clopidogrel   75 mg Oral Daily   insulin  aspart  0-15 Units Subcutaneous TID WC   levothyroxine   250 mcg Oral QAC breakfast   metoprolol  succinate  25 mg Oral Daily   sacubitril -valsartan   1 tablet Oral BID   spironolactone   25 mg Oral Daily    PRN Meds: acetaminophen , albuterol , ondansetron  (ZOFRAN ) IV, prochlorperazine, senna-docusate  Allergies:    Allergies  Allergen Reactions   Ampicillin  Other (See Comments)    "Allergic," per MAR   Aspirin  Nausea Only   Hydrocodone  Other (See Comments)    "Allergic," per MAR   Unasyn  [Ampicillin -Sulbactam Sodium ] Rash and Other (See Comments)    "Allergic," per [facility]    Social History:   Social History   Socioeconomic History   Marital status: Widowed    Spouse name: Not on file   Number of children: 1   Years of education: Not on file   Highest education level: 11th grade  Occupational History   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, he passed"  Tobacco Use   Smoking status: Former    Current packs/day: 0.00    Types: Cigarettes    Quit date: 12/14/2005    Years since quitting: 18.4    Passive exposure: Never   Smokeless tobacco: Never   Tobacco comments:    QUIT IN 2005  Vaping Use   Vaping status: Never Used  Substance and Sexual Activity   Alcohol use: No   Drug use: No   Sexual activity: Never  Other Topics Concern   Not on file  Social History Narrative   Not on file   Family History:   Family History  Problem Relation Age of Onset   Diabetes Mother    Hypertension Mother    Cancer Mother        pancreas   Hypertension Sister      ROS:  Please see the history of present illness.  All other ROS reviewed and negative.     Physical Exam/Data: Vitals:   05/18/24 1250 05/18/24 2105 05/19/24 0400 05/19/24 0445   BP:  108/67 114/63 111/61   Pulse: 72 65 67   Resp: 16 20 14    Temp: 98.9 F (37.2 C) 97.9 F (36.6 C) 98.1 F (36.7 C)   TempSrc: Oral Oral Oral   SpO2: 93% 97% 92%   Weight:    (!) 160.1 kg  Height:        Intake/Output Summary (Last 24 hours) at 05/19/2024 0724 Last data filed at 05/19/2024 0446 Gross per 24 hour  Intake 480 ml  Output 1000 ml  Net -520 ml      05/19/2024    4:45 AM 05/18/2024    5:47 AM 05/17/2024    3:41 AM  Last 3 Weights  Weight (lbs) 352 lb 15.3 oz 350 lb 1.5 oz 351 lb 10.1 oz  Weight (kg) 160.1 kg 158.8 kg 159.5 kg     Body mass index is 58.73 kg/m.  General:  Laying in bed, in no acute distress HEENT: normal Neck: no JVD Vascular: No carotid bruits; Distal pulses 2+ bilaterally Cardiac:  normal S1, S2; RRR; no murmur  Lungs:  clear to auscultation bilaterally, no wheezing, rhonchi or rales  Abd: soft, nontender, no hepatomegaly  Ext: Trace edema, right leg tender Musculoskeletal:  No deformities, BUE and BLE strength normal and equal Skin: warm and dry  Neuro:  CNs 2-12 intact, no focal abnormalities noted Psych:  Normal affect   EKG:  The EKG was personally reviewed and demonstrates:  NSR, HR 79, chronic LBBB (similar to previous)  Telemetry:  Telemetry was personally reviewed and demonstrates:  NSR, HR 60-70's  Relevant CV Studies:  ECHO IMPRESSIONS 05/17/2024  1. Left ventricular ejection fraction, by estimation, is 30 to 35%. The  left ventricle has moderately decreased function. The left ventricle  demonstrates global hypokinesis. The left ventricular internal cavity size  was moderately dilated. There is  moderate left ventricular hypertrophy. Left ventricular diastolic  parameters are consistent with Grade I diastolic dysfunction (impaired  relaxation).   2. Right ventricular systolic function is normal. The right ventricular  size is normal.   3. Left atrial size was mildly dilated.   4. A small pericardial effusion is present. The  pericardial effusion is  posterior to the left ventricle and anterior to the right ventricle.   5. The mitral valve is abnormal. No evidence of mitral valve  regurgitation. No evidence of mitral stenosis. Moderate mitral annular  calcification.   6. The aortic valve is tricuspid. There is mild calcification of the  aortic valve. There is mild thickening of the aortic valve. Aortic valve  regurgitation is not visualized. Aortic valve sclerosis is present, with  no evidence of aortic valve stenosis.   7. The inferior vena cava is normal in size with greater than 50%  respiratory variability, suggesting right atrial pressure of 3 mmHg.   US  Venous LE 05/17/2024 IMPRESSION: 1. Positive for deep vein thrombosis involving the right posterior tibial vein. 2. No evidence for deep vein thrombosis within the left lower extremity.  Laboratory Data: High Sensitivity Troponin:   Recent Labs  Lab 05/16/24 1536 05/16/24 1713  TROPONINIHS 25* 29*     Chemistry Recent Labs  Lab 05/16/24 1536 05/17/24 0652 05/18/24 0255  NA 142 144 141  K 3.8 3.8 4.5  CL 108 108 106  CO2 26 27 28   GLUCOSE 132* 117* 129*  BUN 19 21 26*  CREATININE 1.27* 1.42* 2.10*  CALCIUM  8.1* 7.9* 7.9*  GFRNONAA 47* 41* 26*  ANIONGAP 8 9 7  Recent Labs  Lab 05/16/24 1536  PROT 7.0  ALBUMIN  3.1*  AST 42*  ALT 31  ALKPHOS 248*  BILITOT 1.1   Hematology Recent Labs  Lab 05/16/24 1536 05/17/24 0652 05/19/24 0606  WBC 5.6 5.7 6.8  RBC 4.09 4.07 3.80*  HGB 10.9* 10.9* 10.6*  HCT 35.6* 35.9* 34.8*  MCV 87.0 88.2 91.6  MCH 26.7 26.8 27.9  MCHC 30.6 30.4 30.5  RDW 14.7 14.9 15.2  PLT 208 194 217   Thyroid   Recent Labs  Lab 05/16/24 1713 05/17/24 0652  TSH 135.541*  --   FREET4  --  <0.25*    BNP Recent Labs  Lab 05/16/24 1536 05/17/24 0652  BNP 141.0* 94.0    Radiology/Studies:  US  Venous Img Lower Bilateral (DVT) Result Date: 05/17/2024 CLINICAL DATA:  Lower extremity pain. EXAM: BILATERAL  LOWER EXTREMITY VENOUS DOPPLER ULTRASOUND TECHNIQUE: Gray-scale sonography with graded compression, as well as color Doppler and duplex ultrasound were performed to evaluate the lower extremity deep venous systems from the level of the common femoral vein and including the common femoral, femoral, profunda femoral, popliteal and calf veins including the posterior tibial, peroneal and gastrocnemius veins when visible. The superficial great saphenous vein was also interrogated. Spectral Doppler was utilized to evaluate flow at rest and with distal augmentation maneuvers in the common femoral, femoral and popliteal veins. COMPARISON:  None Available. FINDINGS: RIGHT LOWER EXTREMITY Common Femoral Vein: No evidence of thrombus. Normal compressibility, respiratory phasicity and response to augmentation. Saphenofemoral Junction: No evidence of thrombus. Normal compressibility and flow on color Doppler imaging. Profunda Femoral Vein: No evidence of thrombus. Normal compressibility and flow on color Doppler imaging. Femoral Vein: No evidence of thrombus. Normal compressibility, respiratory phasicity and response to augmentation. Popliteal Vein: No evidence of thrombus. Normal compressibility, respiratory phasicity and response to augmentation. Calf Veins: Absent color Doppler flow within the noncompressible right posterior tibial vein noted. Superficial Great Saphenous Vein: No evidence of thrombus. Normal compressibility. Venous Reflux:  None. Other Findings:  None. LEFT LOWER EXTREMITY Common Femoral Vein: No evidence of thrombus. Normal compressibility, respiratory phasicity and response to augmentation. Saphenofemoral Junction: No evidence of thrombus. Normal compressibility and flow on color Doppler imaging. Profunda Femoral Vein: No evidence of thrombus. Normal compressibility and flow on color Doppler imaging. Femoral Vein: No evidence of thrombus. Normal compressibility, respiratory phasicity and response to  augmentation. Popliteal Vein: No evidence of thrombus. Normal compressibility, respiratory phasicity and response to augmentation. Calf Veins: No evidence of thrombus. Normal compressibility and flow on color Doppler imaging. Superficial Great Saphenous Vein: No evidence of thrombus. Normal compressibility. Venous Reflux:  None. Other Findings:  None. IMPRESSION: 1. Positive for deep vein thrombosis involving the right posterior tibial vein. 2. No evidence for deep vein thrombosis within the left lower extremity. These results will be called to the ordering clinician or representative by the Radiologist Assistant, and communication documented in the PACS or [employer]. Electronically Signed   By: Kimberley Penman M.D.   On: 05/17/2024 11:15   DG Chest Port 1 View Result Date: 05/16/2024 CLINICAL DATA:  sob EXAM: PORTABLE CHEST - 1 VIEW COMPARISON:  October 08, 2023 FINDINGS: Diffuse bilateral perihilar interstitial opacities. Worsening enlargement of the cardiac silhouette. No acute fracture or destructive lesions. Multilevel thoracic osteophytosis. IMPRESSION: 1. Diffuse bilateral perihilar interstitial opacities, likely reflecting interstitial edema. 2. Worsening enlargement of the cardiac silhouette, possibly from progressive cardiomegaly or superimposed pericardial effusion. Electronically Signed   By: Jodelle Mungo.D.  On: 05/16/2024 15:52   Assessment and Plan: HFrEF 2/2 ischemic cardiomyopathy - In 2021 EF dropped to 30% with recovery to 40 to 50% in April 2023. Back to 30-35% in 12/2022 & 08/2023.  - Echo 05/17/2024: EF 30 to 35%, moderately decreased LV function, global hypokinesis, moderately dilated LV cavity, G1 DD, mildly dilated LA, moderate mitral annular calcification, mild calcification and thickening of AV without AS - Reported progressively worsening SOB X 1 week.  SOB noted with minimal exertion, relieved with rest. LE edema x 1 -2 weeks. Noncompliance with medications. BNP 141 >94  (11/2023: 146). CXR noted improvement in interstitial edema.  - I/O: -1740, wt 351 >352 (07/2023: 336, 11/2023: 339>342 lb), Cr 1.27 >>2.10. Suspect Cr bump related to restarting medication and diuresis with concern for noncompliance at home.  - Treated with IV lasix  40 mg x2 and 60 mg BID then d/c.   - Discontinued Jardiance , Lasix , due to AKI - Decrease Entresto  dose 49/51 mg. Continue Toprol  XL 25 mg daily, stop Aldactone  for now.  - Based on follow up Cr, will decide whether to treat with lasix  and restart GDMT, most likely Jardiance  first.  - Per chart review, not a candidate for advanced therapies due to obesity  Pericardial effusion  - s/p failed pericardiocentesis; window in 12/2022 - Previously noted, secondary to hypothyroidism - ECHO 05/17/2024: small pericardial effusion posterior to the LV and anterior to the RV  DVT  - LE US  05/17/2024: + DVT in right posterior tibial vein, - DVT in left LE - pharm started Eliquis 05/17/2024 and continued on Plavix  given her severe CAD and stent.  Hospitalist seeking consult with cardiology on whether to continue both medications. - Per chart review in cath 2018, recommended ASA and plavix  for minimum of one year but continue plavix  indefinitely.  - per Dr. Lincoln Renshaw, will plan to treat DVT 3 -6 months but finally decision up to PCP  - per Dr. Londa Rival, will plan for ASA and Eliquis only while treating for DVT. Once DVT treatment complete, will restart plavix . For now, D/c Plavix . Order  ASA 81 mg. Continue Eliquis  Hypothyroidism - Post thyroidectomy - Uncontrolled per TSH 135 (01/2023: 26), T4 0.25 - Per chart review, followed by endocrinology. On levothyroxine  250 mcg daily x 1 year, which is the correct dose based on weight.  - Last endocrinology visit in May 2023, where she was counseled on parameters to properly take levothyroxine   CAD  - s/p 2018 DES to LAD, RCA x2, and medically managed LCX as opposed to CABG  - Less concern with ischemic cause  with no CP, flat tn, and no ischemic changes on EKG.  - Continue on Lipitor  20 mg and Toprol  25 mg daily - On Plavix  75 mg and Eliquis 10 mg as above.  - per Dr. Londa Rival as above, D/c Plavix . Order  ASA 81 mg. Continue Eliquis  HTN - BP stable, this am 111/61 - managed via GDMT above.   T2DM - On insulin , Ozempic, Jardiance  - As per primary   For questions or updates, please contact [facility] Please consult [email or web address] for contact info under    Signed, Metta Actis, PA-C  05/19/2024 7:24 AM    Attending note:  Patient seen and examined.  I reviewed her records and discussed the case with Ms. Dunlap PA-C including review of findings above.  Ms. Ringold is admitted to the hospital with fatigue and worsening shortness of breath, also pain in her feet and  legs with ambulation.  She states that her baseline weight is usually under 340 pounds, now around 350 pounds per hospital scales.  She has known HFrEF with ischemic cardiomyopathy, stable LV dysfunction with follow-up echocardiogram showing LVEF 30 to 35% range and normal RV contraction.  She has prior history of pericardial effusion requiring window, small residual pericardial effusion noted by follow-up echocardiogram and of no hemodynamic significance.  She admits that she does not take her medications at home consistently, does not use Lasix  regularly due to complaints of frequent urination.  She has been diagnosed with a right posterior tibial DVT during this admission.  Presenting chest x-ray shows interstitial edema with enlarged cardiac silhouette.  With IV diuresis she has had AKI with creatinine increasing from 1.27 up to 2.1.  On examination she appears comfortable, no shortness of breath or chest pain at rest.  She is afebrile, heart rate in the 60s to 70s in sinus rhythm by telemetry, systolic running 621-308.  She has had a net urine output of approximately 1800 cc since June 6.  Difficulty assess JVP.  Lungs  exhibit decreased breath sounds but no crackles.  She has bilateral peripheral edema, symmetrical.  Pertinent lab work includes hemoglobin 10.6, platelets 217, creatinine 2.1 on June 7 with follow-up lab work pending, BNP 94, high-sensitivity troponin I 29, TSH 135.5 with free T4 less than 0.25.  ECG reviewed showing sinus rhythm with IVCD of left bundle branch block type.  Follow-up chest x-ray today shows improving interstitial edema.  Recommendation at this time is to hold Lasix  and Jardiance , stop Aldactone , and reduce Entresto  to 49/51 mg twice daily pending follow-up BMET.  Can continue Toprol -XL 25 mg daily otherwise.  Anticipate 38-month course of Eliquis for treatment of DVT, would stop Plavix  and replace it with coated aspirin  81 mg daily during that time.  She can go back to Plavix  once she is off Eliquis.  Difficult to assess her fluid status, ReDS clip would not be reliable and BNP likely falsely low.  Looks like her weight is at least 10 pounds over reported baseline, this could also be related to severe hypothyroidism which is undertreated.  We will continue to follow and make further recommendations regarding GDMT and fluid management.  Gerard Knight, M.D., F.A.C.C.

## 2024-05-19 NOTE — Plan of Care (Signed)
  Problem: Pain Managment: Goal: General experience of comfort will improve and/or be controlled Outcome: Progressing   Problem: Safety: Goal: Ability to remain free from injury will improve Outcome: Progressing   Problem: Skin Integrity: Goal: Risk for impaired skin integrity will decrease Outcome: Progressing

## 2024-05-19 NOTE — Progress Notes (Addendum)
 Heart Failure Navigator Progress Note  Assessed for Heart & Vascular TOC clinic readiness. Patient does not meet criteria due to current Advanced Heart Failure Team patient of Alwin Baars, MD.  Patient has had multiple canceled appointments  there and per patient due to her nurses aide needing to be able to accompany her to appointments because of a fall risk.  Per notes patient has been non-compliant with medications. Will explore possible opportunities for this patient with Paramedicine services in [place], [place] if appropriate. Bedside RN to provide this patient with "Living Better with Heart Failure" booklet. All information was reviewed with this patient via telephone to [facility] room 314.  Navigator available for reassessment of patient.   Celedonio Coil, RN, BSN [facility] Heart Failure Navigator Secure Chat Only

## 2024-05-19 NOTE — Telephone Encounter (Signed)
 Patient Product/process development scientist completed.    The patient is insured through [facility]. Patient has Medicare and is not eligible for a copay card, but may be able to apply for patient assistance or Medicare RX Payment Plan (Patient Must reach out to their plan, if eligible for payment plan), if available.    Ran test claim for Eliquis 5 mg and the current 30 day co-pay is $0.00.   This test claim was processed through [facility]- copay amounts may vary at other pharmacies due to pharmacy/plan contracts, or as the patient moves through the different stages of their insurance plan.     Roland Earl, CPHT Pharmacy Technician III Certified Patient Advocate [facility] Patient Advocate Team Direct Number: [phone]  Fax: [phone]

## 2024-05-19 NOTE — Progress Notes (Signed)
 PROGRESS NOTE   Maureen Jackson  WUJ:[id number] DOB: [birth year] DOA: 05/16/2024 PCP: Veda Gerald, MD   Chief Complaint  Patient presents with   Shortness of Breath   Level of care: Telemetry  Brief Admission History:  68 y.o. female,  with HFrEF 2/2 ischemic cardiomyopathy, hypothyroidism, CAD s/p PCI to the LAD and RCA , PAD, HTN, T2DM, OSA not on CPAP, CKD and morbid obesity/nonischemic cardiomyopathy with a EF 35%, history of pericardial effusion status post pericardial window.  Patient presents to ED secondary to complaints of shortness of breath, she denies any chest pain, fever, chills, reports shortness of breath progressive over the last week, but significant today which prompted her to come to ED, patient report she has not been taking her Lasix .  In ED chest x-ray significant for enlarged cardiac silhouette, possible pulmonary edema, she has elevated BNP, bedside ultrasound by ED physician with finding of small pericardial effusion, she was started on IV lasix , ED discussed with cardiology at [facility], who recommended admission to [facility] and to obtain echo and to diurese.   Assessment and Plan:  Acute on chronic HFrEF Ischemic and nonischemic cardiomyopathy Pulmonary edema -pt admits she is not taking lasix  at home because it causes frequent urination and she is mostly immobile.    -she was started on IV Lasix  60 mg IV twice daily, daily weights, strict ins and out -continue with G GDMT regimen including Entresto , Toprol -XL, Aldactone  and Jardiance . -Follow up 2D echo: no significant pericardial effusion, LVEF remains 30-35% with global hypokinesis, grade 1 DD, aortic valve sclerosis with no aortic valve stenosis. -repeated CXR shows modest improvement in interstitial edema -holding diuretics today due to bump in creatinine -hold potassium supplementation -recheck BMP in AM  -pt reports dry weight is about 340#  -encouraged sodium restriction 2 gm/day    History of pericardial effusion s/p window - History of pericardial effusion, failed pericardiocentesis; s/p window in 12/2022 - Chest x-ray with increased cardiac silhouette, but bedside echo by ED physician with no evidence of large pleural effusion, as well she is status post pericardial window which should prevent recurrence of effusion - follow up repeat 2D echo with report of small pericardial effusion present - outpatient providers felt to be secondary to hypothyroidism which remains uncontrolled as evidenced by TSH 135.5 and free T4<0.25.    DVT right post tibial vein Right leg pain  - suspect precipitated by prolonged immobility  - pharm D dosed apixaban started 05/17/24  - appreciate cardiology team recs, for length of DVT treatment, take aspirin  81 mg and hold plavix  - resume plavix  and stop aspirin  after completing 3 months treatment for DVT - counseled patient regarding high bleeding risk   CAD - outpatient cardiology records indicate she had LHC in 2018 demonstrating severe multivessel coronary artery disease. Patient underwent PCI as opposed to CABG with DES to the LAD, x 2 to the RCA and left circumflex disease managed medically.  - continue Lipitor , Toprol , holding plavix  during DVT treatment and taking aspirin  81 mg daily - She denies any chest pain  - No chest pain.    AKI on CKD stage 3b - holding diuretics today - creatinine 2.32, recheck BMP in AM    Morbid obesity -Body mass index is 56.25 kg/m. -pt reports she has been on Ozempic since 2023   Chronic pain of morbid obesity - ordered a bariatric bed - ordered PRN meds for pain   Hypertension - Continue with home regimen  Diabetes mellitus, type II -She is on Jardiance  and Ozempic, continue with Jardiance , Ozempic is nonformulary, continue insulin  sliding scale CBG (last 3)  Recent Labs    05/18/24 1621 05/18/24 2108 05/19/24 0737  GLUCAP 152* 149* 134*   Hypothyroidism, post thyroidectomy -  uncontrolled as evidenced by TSH 135  - checked free T4---<0.25  - pt insistent that she takes levothyroxine  daily - possibly not absorbing the drug but I worry about compliance - compliance is a concern as she tells me she does not take it regularly, not on empty stomach and takes it with her other pills when she does manage to take it. I counseled her at length about why she is not feeling better and the need to take the medication correctly.  - she says she is followed by endocrinology and she is on 250 mcg of levothyroxine  daily which is the correct dose based on her weight of 256 mcg/day and she says she has been on this dose for longer than 1 year - last visit with endocrinology was in May 2023: she was counseled regarding the correct intake of her thyroid  hormone, on empty stomach at fasting, with water, separated by at least 30 minutes from breakfast and other medications,  and separated by more than 4 hours from calcium , iron , multivitamins, acid reflux medications (PPIs).    Deconditioning - consult PT, OT - pt reports she will consider SNF rehab but not certain at this time - she reports that she lives alone.   DVT prophylaxis: apixaban Code Status: Full  Family Communication:  Disposition: TBD    Consultants:  cardiology  Procedures:   Antimicrobials:    Subjective: Pt complaints of pain in her joints, right leg and back, says she is very fatigued, says she had not been taking levothyroxine  consistently and had not been taking on empty stomach as prescribed and taking it with her other meds.    Objective: Vitals:   05/18/24 1250 05/18/24 2105 05/19/24 0400 05/19/24 0445  BP: 108/67 114/63 111/61   Pulse: 72 65 67   Resp: 16 20 14    Temp: 98.9 F (37.2 C) 97.9 F (36.6 C) 98.1 F (36.7 C)   TempSrc: Oral Oral Oral   SpO2: 93% 97% 92%   Weight:    (!) 160.1 kg  Height:        Intake/Output Summary (Last 24 hours) at 05/19/2024 1014 Last data filed at 05/19/2024  0900 Gross per 24 hour  Intake 480 ml  Output 1000 ml  Net -520 ml   Filed Weights   05/17/24 0341 05/18/24 0547 05/19/24 0445  Weight: (!) 159.5 kg (!) 158.8 kg (!) 160.1 kg   Examination:  General exam: morbidly obese female, NAD, Appears calm but Uncomfortable  Respiratory system: Clear to auscultation. Respiratory effort normal. Cardiovascular system: normal S1 & S2 heard. No JVD, murmurs, rubs, gallops or clicks. No pedal edema. Gastrointestinal system: Abdomen is nondistended, soft and nontender. No organomegaly or masses felt. Normal bowel sounds heard. Central nervous system: Alert and oriented. No focal neurological deficits. Extremities: 2+ peripheral edema.  Symmetric 5 x 5 power. Skin: No rashes, lesions or ulcers. Psychiatry: Judgement and insight appear poor. Mood & affect flat.   Data Reviewed: I have personally reviewed following labs and imaging studies  CBC: Recent Labs  Lab 05/16/24 1536 05/17/24 0652 05/19/24 0606  WBC 5.6 5.7 6.8  HGB 10.9* 10.9* 10.6*  HCT 35.6* 35.9* 34.8*  MCV 87.0 88.2 91.6  PLT 208  194 217    Basic Metabolic Panel: Recent Labs  Lab 05/16/24 1536 05/17/24 0652 05/18/24 0255 05/19/24 0606  NA 142 144 141 141  K 3.8 3.8 4.5 4.7  CL 108 108 106 106  CO2 26 27 28 24   GLUCOSE 132* 117* 129* 136*  BUN 19 21 26* 35*  CREATININE 1.27* 1.42* 2.10* 2.32*  CALCIUM  8.1* 7.9* 7.9* 8.1*  MG  --   --   --  2.1    CBG: Recent Labs  Lab 05/18/24 0724 05/18/24 1107 05/18/24 1621 05/18/24 2108 05/19/24 0737  GLUCAP 105* 138* 152* 149* 134*    Recent Results (from the past 240 hours)  Resp panel by RT-PCR (RSV, Flu A&B, Covid) Anterior Nasal Swab     Status: None   Collection Time: 05/16/24  3:10 PM   Specimen: Anterior Nasal Swab  Result Value Ref Range Status   SARS Coronavirus 2 by RT PCR NEGATIVE NEGATIVE Final    Comment: (NOTE) SARS-CoV-2 target nucleic acids are NOT DETECTED.  The SARS-CoV-2 RNA is generally  detectable in upper respiratory specimens during the acute phase of infection. The lowest concentration of SARS-CoV-2 viral copies this assay can detect is 138 copies/mL. A negative result does not preclude SARS-Cov-2 infection and should not be used as the sole basis for treatment or other patient management decisions. A negative result may occur with  improper specimen collection/handling, submission of specimen other than nasopharyngeal swab, presence of viral mutation(s) within the areas targeted by this assay, and inadequate number of viral copies(<138 copies/mL). A negative result must be combined with clinical observations, patient history, and epidemiological information. The expected result is Negative.  Fact Sheet for Patients:  BloggerCourse.com  Fact Sheet for Healthcare Providers:  SeriousBroker.it  This test is no t yet approved or cleared by the [place]  FDA and  has been authorized for detection and/or diagnosis of SARS-CoV-2 by FDA under an Emergency Use Authorization (EUA). This EUA will remain  in effect (meaning this test can be used) for the duration of the COVID-19 declaration under Section 564(b)(1) of the Act, 21 U.S.C.section 360bbb-3(b)(1), unless the authorization is terminated  or revoked sooner.       Influenza A by PCR NEGATIVE NEGATIVE Final   Influenza B by PCR NEGATIVE NEGATIVE Final    Comment: (NOTE) The Xpert Xpress SARS-CoV-2/FLU/RSV plus assay is intended as an aid in the diagnosis of influenza from Nasopharyngeal swab specimens and should not be used as a sole basis for treatment. Nasal washings and aspirates are unacceptable for Xpert Xpress SARS-CoV-2/FLU/RSV testing.  Fact Sheet for Patients: BloggerCourse.com  Fact Sheet for Healthcare Providers: SeriousBroker.it  This test is not yet approved or cleared by the [place]  FDA  and has been authorized for detection and/or diagnosis of SARS-CoV-2 by FDA under an Emergency Use Authorization (EUA). This EUA will remain in effect (meaning this test can be used) for the duration of the COVID-19 declaration under Section 564(b)(1) of the Act, 21 U.S.C. section 360bbb-3(b)(1), unless the authorization is terminated or revoked.     Resp Syncytial Virus by PCR NEGATIVE NEGATIVE Final    Comment: (NOTE) Fact Sheet for Patients: BloggerCourse.com  Fact Sheet for Healthcare Providers: SeriousBroker.it  This test is not yet approved or cleared by the [place]  FDA and has been authorized for detection and/or diagnosis of SARS-CoV-2 by FDA under an Emergency Use Authorization (EUA). This EUA will remain in effect (meaning this test can be used) for the duration  of the COVID-19 declaration under Section 564(b)(1) of the Act, 21 U.S.C. section 360bbb-3(b)(1), unless the authorization is terminated or revoked.  Performed at [facility], [address], [place], [place] [address]      Radiology Studies: DG CHEST PORT 1 VIEW Result Date: 05/19/2024 CLINICAL DATA:  Pulmonary edema EXAM: PORTABLE CHEST 1 VIEW COMPARISON:  Chest x-ray performed May 16, 2024 FINDINGS: Modest improvement in interstitial edema. Enlarged heart. Limited visualization of the retrocardiac region. IMPRESSION: 1. Modest improvement in interstitial edema. 2. Enlarged heart. Electronically Signed   By: Reagan Camera M.D.   On: 05/19/2024 08:25   ECHOCARDIOGRAM COMPLETE Result Date: 05/17/2024    ECHOCARDIOGRAM REPORT   Patient Name:   Maureen Jackson Date of Exam: 05/17/2024 Medical Rec #:  [id number]       Height:       65.0 in Accession #:    [id number]      Weight:       351.6 lb Date of Birth:  05/18/1956        BSA:          2.514 m Patient Age:    68 years        BP:           128/92 mmHg Patient Gender: F               HR:           79 bpm. Exam  Location:  [facility] Procedure: 2D Echo, Cardiac Doppler, Color Doppler and Intracardiac            Opacification Agent (Both Spectral and Color Flow Doppler were            utilized during procedure). Indications:    Pericardial effusion I31.3  History:        Patient has prior history of Echocardiogram examinations, most                 recent 08/20/2023. CHF and Cardiomyopathy, CAD, TIA; Risk                 Factors:Hypertension, Diabetes, Dyslipidemia and Sleep Apnea.                 Morbid Obesity.  Sonographer:    Denese Finn RCS Referring Phys: [phone] [place]  Sonographer Comments: Technically difficult study due to poor echo windows and suboptimal subcostal window. IMPRESSIONS  1. Left ventricular ejection fraction, by estimation, is 30 to 35%. The left ventricle has moderately decreased function. The left ventricle demonstrates global hypokinesis. The left ventricular internal cavity size was moderately dilated. There is moderate left ventricular hypertrophy. Left ventricular diastolic parameters are consistent with Grade I diastolic dysfunction (impaired relaxation).  2. Right ventricular systolic function is normal. The right ventricular size is normal.  3. Left atrial size was mildly dilated.  4. A small pericardial effusion is present. The pericardial effusion is posterior to the left ventricle and anterior to the right ventricle.  5. The mitral valve is abnormal. No evidence of mitral valve regurgitation. No evidence of mitral stenosis. Moderate mitral annular calcification.  6. The aortic valve is tricuspid. There is mild calcification of the aortic valve. There is mild thickening of the aortic valve. Aortic valve regurgitation is not visualized. Aortic valve sclerosis is present, with no evidence of aortic valve stenosis.  7. The inferior vena cava is normal in size with greater than 50% respiratory variability, suggesting right atrial pressure of 3  mmHg. FINDINGS  Left Ventricle: Left  ventricular ejection fraction, by estimation, is 30 to 35%. The left ventricle has moderately decreased function. The left ventricle demonstrates global hypokinesis. Definity  contrast agent was given IV to delineate the left ventricular endocardial borders. Strain was performed and the global longitudinal strain is indeterminate. The left ventricular internal cavity size was moderately dilated. There is moderate left ventricular hypertrophy. Left ventricular diastolic parameters are consistent with Grade I diastolic dysfunction (impaired relaxation). Right Ventricle: The right ventricular size is normal. No increase in right ventricular wall thickness. Right ventricular systolic function is normal. Left Atrium: Left atrial size was mildly dilated. Right Atrium: Right atrial size was normal in size. Pericardium: A small pericardial effusion is present. The pericardial effusion is posterior to the left ventricle and anterior to the right ventricle. Mitral Valve: The mitral valve is abnormal. There is mild thickening of the mitral valve leaflet(s). There is mild calcification of the mitral valve leaflet(s). Moderate mitral annular calcification. No evidence of mitral valve regurgitation. No evidence  of mitral valve stenosis. Tricuspid Valve: The tricuspid valve is normal in structure. Tricuspid valve regurgitation is not demonstrated. No evidence of tricuspid stenosis. Aortic Valve: The aortic valve is tricuspid. There is mild calcification of the aortic valve. There is mild thickening of the aortic valve. Aortic valve regurgitation is not visualized. Aortic valve sclerosis is present, with no evidence of aortic valve stenosis. Pulmonic Valve: The pulmonic valve was normal in structure. Pulmonic valve regurgitation is trivial. No evidence of pulmonic stenosis. Aorta: The aortic root is normal in size and structure. Venous: The inferior vena cava is normal in size with greater than 50% respiratory variability,  suggesting right atrial pressure of 3 mmHg. IAS/Shunts: No atrial level shunt detected by color flow Doppler. Additional Comments: 3D was performed not requiring image post processing on an independent workstation and was indeterminate.  LEFT VENTRICLE PLAX 2D LVIDd:         6.00 cm   Diastology LVIDs:         4.60 cm   LV e' medial:   3.70 cm/s LV PW:         1.50 cm   LV E/e' medial: 13.0 LV IVS:        1.40 cm LVOT diam:     1.80 cm LV SV:         25 LV SV Index:   10 LVOT Area:     2.54 cm  RIGHT VENTRICLE RV S prime:     5.96 cm/s LEFT ATRIUM             Index LA diam:        4.10 cm 1.63 cm/m LA Vol (A2C):   80.4 ml 31.98 ml/m LA Vol (A4C):   76.1 ml 30.27 ml/m LA Biplane Vol: 78.8 ml 31.34 ml/m  AORTIC VALVE LVOT Vmax:   65.57 cm/s LVOT Vmean:  45.700 cm/s LVOT VTI:    0.097 m  AORTA Ao Root diam: 3.50 cm MITRAL VALVE MV Area (PHT): 2.81 cm    SHUNTS MV Decel Time: 270 msec    Systemic VTI:  0.10 m MV E velocity: 48.05 cm/s  Systemic Diam: 1.80 cm MV A velocity: 68.55 cm/s MV E/A ratio:  0.70 Janelle Mediate MD Electronically signed by Janelle Mediate MD Signature Date/Time: 05/17/2024/3:24:41 PM    Final    US  Venous Img Lower Bilateral (DVT) Result Date: 05/17/2024 CLINICAL DATA:  Lower extremity pain. EXAM: BILATERAL LOWER EXTREMITY VENOUS  DOPPLER ULTRASOUND TECHNIQUE: Gray-scale sonography with graded compression, as well as color Doppler and duplex ultrasound were performed to evaluate the lower extremity deep venous systems from the level of the common femoral vein and including the common femoral, femoral, profunda femoral, popliteal and calf veins including the posterior tibial, peroneal and gastrocnemius veins when visible. The superficial great saphenous vein was also interrogated. Spectral Doppler was utilized to evaluate flow at rest and with distal augmentation maneuvers in the common femoral, femoral and popliteal veins. COMPARISON:  None Available. FINDINGS: RIGHT LOWER EXTREMITY Common Femoral  Vein: No evidence of thrombus. Normal compressibility, respiratory phasicity and response to augmentation. Saphenofemoral Junction: No evidence of thrombus. Normal compressibility and flow on color Doppler imaging. Profunda Femoral Vein: No evidence of thrombus. Normal compressibility and flow on color Doppler imaging. Femoral Vein: No evidence of thrombus. Normal compressibility, respiratory phasicity and response to augmentation. Popliteal Vein: No evidence of thrombus. Normal compressibility, respiratory phasicity and response to augmentation. Calf Veins: Absent color Doppler flow within the noncompressible right posterior tibial vein noted. Superficial Great Saphenous Vein: No evidence of thrombus. Normal compressibility. Venous Reflux:  None. Other Findings:  None. LEFT LOWER EXTREMITY Common Femoral Vein: No evidence of thrombus. Normal compressibility, respiratory phasicity and response to augmentation. Saphenofemoral Junction: No evidence of thrombus. Normal compressibility and flow on color Doppler imaging. Profunda Femoral Vein: No evidence of thrombus. Normal compressibility and flow on color Doppler imaging. Femoral Vein: No evidence of thrombus. Normal compressibility, respiratory phasicity and response to augmentation. Popliteal Vein: No evidence of thrombus. Normal compressibility, respiratory phasicity and response to augmentation. Calf Veins: No evidence of thrombus. Normal compressibility and flow on color Doppler imaging. Superficial Great Saphenous Vein: No evidence of thrombus. Normal compressibility. Venous Reflux:  None. Other Findings:  None. IMPRESSION: 1. Positive for deep vein thrombosis involving the right posterior tibial vein. 2. No evidence for deep vein thrombosis within the left lower extremity. These results will be called to the ordering clinician or representative by the Radiologist Assistant, and communication documented in the PACS or [employer]. Electronically Signed    By: Kimberley Penman M.D.   On: 05/17/2024 11:15    Scheduled Meds:  apixaban  10 mg Oral BID   Followed by   Cecily Cohen ON 05/24/2024] apixaban  5 mg Oral BID   aspirin  EC  81 mg Oral Daily   atorvastatin   20 mg Oral Daily   brimonidine   1 drop Both Eyes BID   And   timolol   1 drop Both Eyes BID   calcium  carbonate  4 tablet Oral BID WC   insulin  aspart  0-15 Units Subcutaneous TID WC   levothyroxine   250 mcg Oral QAC breakfast   metoprolol  succinate  25 mg Oral Daily   sacubitril -valsartan   1 tablet Oral BID   Continuous Infusions:   LOS: 0 days   Time spent: 55 mins  Olina Melfi Lincoln Renshaw, MD How to contact the [facility] Attending or Consulting provider 7A - 7P or covering provider during after hours 7P -7A, for this patient?  Check the care team in [facility] and look for a) attending/consulting TRH provider listed and b) the TRH team listed Log into [email or web address] to find provider on call.  Locate the TRH provider you are looking for under [facility] and page to a number that you can be directly reached. If you still have difficulty reaching the provider, please page the [facility] (Director on Call) for the Hospitalists listed on amion for assistance.  05/19/2024,  10:14 AM

## 2024-05-20 ENCOUNTER — Telehealth (HOSPITAL_COMMUNITY): Payer: Self-pay | Admitting: Pharmacy Technician

## 2024-05-20 ENCOUNTER — Other Ambulatory Visit (HOSPITAL_COMMUNITY): Payer: Self-pay

## 2024-05-20 ENCOUNTER — Encounter (HOSPITAL_COMMUNITY): Admitting: Cardiology

## 2024-05-20 DIAGNOSIS — R0602 Shortness of breath: Secondary | ICD-10-CM | POA: Diagnosis present

## 2024-05-20 DIAGNOSIS — I82441 Acute embolism and thrombosis of right tibial vein: Secondary | ICD-10-CM | POA: Diagnosis present

## 2024-05-20 DIAGNOSIS — N189 Chronic kidney disease, unspecified: Secondary | ICD-10-CM | POA: Diagnosis not present

## 2024-05-20 DIAGNOSIS — I5020 Unspecified systolic (congestive) heart failure: Secondary | ICD-10-CM | POA: Diagnosis not present

## 2024-05-20 DIAGNOSIS — I255 Ischemic cardiomyopathy: Secondary | ICD-10-CM | POA: Diagnosis not present

## 2024-05-20 DIAGNOSIS — K219 Gastro-esophageal reflux disease without esophagitis: Secondary | ICD-10-CM | POA: Diagnosis present

## 2024-05-20 DIAGNOSIS — Z1152 Encounter for screening for COVID-19: Secondary | ICD-10-CM | POA: Diagnosis not present

## 2024-05-20 DIAGNOSIS — I130 Hypertensive heart and chronic kidney disease with heart failure and stage 1 through stage 4 chronic kidney disease, or unspecified chronic kidney disease: Secondary | ICD-10-CM | POA: Diagnosis present

## 2024-05-20 DIAGNOSIS — N179 Acute kidney failure, unspecified: Secondary | ICD-10-CM | POA: Diagnosis not present

## 2024-05-20 DIAGNOSIS — I2510 Atherosclerotic heart disease of native coronary artery without angina pectoris: Secondary | ICD-10-CM | POA: Diagnosis not present

## 2024-05-20 DIAGNOSIS — E890 Postprocedural hypothyroidism: Secondary | ICD-10-CM | POA: Diagnosis present

## 2024-05-20 DIAGNOSIS — Z7984 Long term (current) use of oral hypoglycemic drugs: Secondary | ICD-10-CM | POA: Diagnosis not present

## 2024-05-20 DIAGNOSIS — I5023 Acute on chronic systolic (congestive) heart failure: Secondary | ICD-10-CM | POA: Diagnosis not present

## 2024-05-20 DIAGNOSIS — N1832 Chronic kidney disease, stage 3b: Secondary | ICD-10-CM | POA: Diagnosis not present

## 2024-05-20 DIAGNOSIS — I3481 Nonrheumatic mitral (valve) annulus calcification: Secondary | ICD-10-CM | POA: Diagnosis present

## 2024-05-20 DIAGNOSIS — I517 Cardiomegaly: Secondary | ICD-10-CM | POA: Diagnosis not present

## 2024-05-20 DIAGNOSIS — I82401 Acute embolism and thrombosis of unspecified deep veins of right lower extremity: Secondary | ICD-10-CM | POA: Diagnosis not present

## 2024-05-20 DIAGNOSIS — E1122 Type 2 diabetes mellitus with diabetic chronic kidney disease: Secondary | ICD-10-CM | POA: Diagnosis present

## 2024-05-20 DIAGNOSIS — Z6841 Body Mass Index (BMI) 40.0 and over, adult: Secondary | ICD-10-CM | POA: Diagnosis not present

## 2024-05-20 DIAGNOSIS — Z7985 Long-term (current) use of injectable non-insulin antidiabetic drugs: Secondary | ICD-10-CM | POA: Diagnosis not present

## 2024-05-20 DIAGNOSIS — I3139 Other pericardial effusion (noninflammatory): Secondary | ICD-10-CM | POA: Diagnosis not present

## 2024-05-20 DIAGNOSIS — Z7901 Long term (current) use of anticoagulants: Secondary | ICD-10-CM | POA: Diagnosis not present

## 2024-05-20 DIAGNOSIS — E782 Mixed hyperlipidemia: Secondary | ICD-10-CM | POA: Diagnosis present

## 2024-05-20 DIAGNOSIS — I428 Other cardiomyopathies: Secondary | ICD-10-CM | POA: Diagnosis present

## 2024-05-20 DIAGNOSIS — E039 Hypothyroidism, unspecified: Secondary | ICD-10-CM | POA: Diagnosis not present

## 2024-05-20 DIAGNOSIS — Z7902 Long term (current) use of antithrombotics/antiplatelets: Secondary | ICD-10-CM | POA: Diagnosis not present

## 2024-05-20 DIAGNOSIS — Z794 Long term (current) use of insulin: Secondary | ICD-10-CM | POA: Diagnosis not present

## 2024-05-20 DIAGNOSIS — Z7982 Long term (current) use of aspirin: Secondary | ICD-10-CM | POA: Diagnosis not present

## 2024-05-20 DIAGNOSIS — M069 Rheumatoid arthritis, unspecified: Secondary | ICD-10-CM | POA: Diagnosis present

## 2024-05-20 DIAGNOSIS — G8929 Other chronic pain: Secondary | ICD-10-CM | POA: Diagnosis present

## 2024-05-20 DIAGNOSIS — I82409 Acute embolism and thrombosis of unspecified deep veins of unspecified lower extremity: Secondary | ICD-10-CM | POA: Diagnosis present

## 2024-05-20 DIAGNOSIS — N178 Other acute kidney failure: Secondary | ICD-10-CM | POA: Diagnosis not present

## 2024-05-20 LAB — CBC
HCT: 35.7 % — ABNORMAL LOW (ref 36.0–46.0)
Hemoglobin: 10.8 g/dL — ABNORMAL LOW (ref 12.0–15.0)
MCH: 27.5 pg (ref 26.0–34.0)
MCHC: 30.3 g/dL (ref 30.0–36.0)
MCV: 90.8 fL (ref 80.0–100.0)
Platelets: 225 10*3/uL (ref 150–400)
RBC: 3.93 MIL/uL (ref 3.87–5.11)
RDW: 14.9 % (ref 11.5–15.5)
WBC: 7 10*3/uL (ref 4.0–10.5)
nRBC: 0 % (ref 0.0–0.2)

## 2024-05-20 LAB — BASIC METABOLIC PANEL WITH GFR
Anion gap: 7 (ref 5–15)
BUN: 36 mg/dL — ABNORMAL HIGH (ref 8–23)
CO2: 29 mmol/L (ref 22–32)
Calcium: 8.3 mg/dL — ABNORMAL LOW (ref 8.9–10.3)
Chloride: 105 mmol/L (ref 98–111)
Creatinine, Ser: 1.99 mg/dL — ABNORMAL HIGH (ref 0.44–1.00)
GFR, Estimated: 27 mL/min — ABNORMAL LOW (ref >60)
Glucose, Bld: 128 mg/dL — ABNORMAL HIGH (ref 70–99)
Potassium: 4.8 mmol/L (ref 3.5–5.1)
Sodium: 141 mmol/L (ref 135–145)

## 2024-05-20 LAB — GLUCOSE, CAPILLARY
Glucose-Capillary: 102 mg/dL — ABNORMAL HIGH (ref 70–99)
Glucose-Capillary: 139 mg/dL — ABNORMAL HIGH (ref 70–99)
Glucose-Capillary: 153 mg/dL — ABNORMAL HIGH (ref 70–99)
Glucose-Capillary: 157 mg/dL — ABNORMAL HIGH (ref 70–99)

## 2024-05-20 NOTE — TOC Progression Note (Signed)
 Transition of Care [facility]) - Progression Note    Patient Details  Name: Maureen Jackson MRN: [id number] Date of Birth: 1956/09/14  [facility] of [facility]) CM/SW Contact  Cyndie Dredge, [place] Phone Number: 05/20/2024, 3:24 PM  Clinical Narrative:     This Clinical research associate spoke with patient about the DME she requested for a lift recliner chair, bariatric BSC , and bariatric walker. However , based on her insurance plan , patient received a bariatric walker and BSC around May last year and her insurance has denied to cover. For the walker the DME company, provided a price of $150 . When discussing with patient she adamantly stated that she did not need a walker. For the lift recliner chair, patient was advised to check [employer] or furniture place because her insurance will not cover. Patient expressed understanding. TOC to follow.    Expected Discharge Plan: Home w Home Health Services Barriers to Discharge: Continued Medical Work up  Expected Discharge Plan and Services In-house Referral: Clinical Social Work     Living arrangements for the past 2 months: Single Family Home                 DME Arranged: Otho Blitz wide DME Agency: [employer] Date DME Agency Contacted: 05/19/24 Time DME Agency Contacted: 1030 Representative spoke with at DME Agency: Gladys Lamp HH Arranged: PT HH Agency: [facility] Date [facility] Agency Contacted: 05/19/24 Time HH Agency Contacted: 1030 Representative spoke with at [facility] Agency: Randel Buss   Social Determinants of Health (SDOH) Interventions SDOH Screenings   Food Insecurity: No Food Insecurity (05/19/2024)  Housing: Low Risk  (05/19/2024)  Transportation Needs: Unmet Transportation Needs (05/19/2024)  Utilities: Not At Risk (05/16/2024)  Alcohol Screen: Low Risk  (12/22/2022)  Depression (PHQ2-9): Low Risk  (01/01/2020)  Financial Resource Strain: Low Risk  (05/19/2024)  Social Connections: Unknown (05/16/2024)  Tobacco Use: Medium Risk (05/19/2024)     Readmission Risk Interventions    05/20/2024    3:24 PM 12/21/2022    9:38 AM  Readmission Risk Prevention Plan  Transportation Screening Complete Complete  Home Care Screening Complete Complete  Medication Review (RN CM) Complete Complete

## 2024-05-20 NOTE — Telephone Encounter (Addendum)
 Patient Product/process development scientist completed.    The patient is insured through [facility]. Patient has Medicare and is not eligible for a copay card, but may be able to apply for patient assistance or Medicare RX Payment Plan (Patient Must reach out to their plan, if eligible for payment plan), if available.    Ran test claim for Entresto  24-26 mg and the current 30 day co-pay is $0.00.  Ran test claim for Farxiga 10 mg and the current 30 day co-pay is $0.00.  Ran test claim for Jardiance  10 mg and the current 30 day co-pay is $0.00.  Ran test claim for Karendia 10 mg and Requires Prior Authorization  This test claim was processed through [employer]- copay amounts may vary at other pharmacies due to [employer], or as the patient moves through the different stages of their insurance plan.     Morgan Arab, CPHT Pharmacy Technician III Certified Patient Advocate [facility] Pharm[facility] Patient Advocate Team Direct Number: [phone]  Fax: [phone]

## 2024-05-20 NOTE — Progress Notes (Signed)
 Physical Therapy Treatment Patient Details Name: Maureen Jackson MRN: [id number] DOB: 03/16/1956 Today's Date: 05/20/2024   History of Present Illness Maureen Jackson  is a 68 y.o. 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  - In ED chest x-ray significant for enlarged cardiac silhouette, possible pulmonary edema, she has elevated BNP, bedside ultrasound by ED physician with finding of small pericardial effusion, she was started on IV lasix , ED discussed with cardiology at [facility], who recommended admission to [facility] and to obtain echo and to diurese.    PT Comments  Patient agreeable to PT treatment and tolerated well. Nursing staff requesting assistance with getting pt OOB on this date. Patient received supine in bed. With [facility] elevated, use of bed railings, and inc time patient is mod I with supine to sit. Patient performs some LE strengthening seated EOB, limited active ankle DF bilaterally of note. Patient requires multiple, ~4 attempts, at STS before completing with min/CGA and bari RW. Step pivot to chair with CGA. Patient left in chair, with chair alarm activated, and call bell within reach. Nursing staff notified. Patient will benefit from continued skilled physical therapy acutely and in recommended venue (as patient reports receiving daily help at home from daughter who is her PCA and grandson who lives in her complex) in order to address general weakness, and endurance.     If plan is  discharge home, recommend the following: A little help with walking and/or transfers;A little help with bathing/dressing/bathroom;Assist for transportation;Assistance with cooking/housework   Can travel by Training and development officer (2 wheels)    Recommendations for Other Services       Precautions / Restrictions Precautions Precautions: Fall Recall of Precautions/Restrictions: Intact Restrictions Weight Bearing Restrictions Per Provider Order: No     Mobility  Bed Mobility Overal bed mobility: Needs Assistance Bed Mobility: Supine to Sit     Supine to sit: Modified independent (Device/Increase time), Used rails, HOB elevated     General bed mobility comments: HOB slightly elevated, pt used L bed railing to sit EOB. Inc time needed due to slow labored movement. No phys assist    Transfers Overall transfer level: Needs assistance Equipment used: Rolling walker (2 wheels) Transfers: Sit to/from Stand, Bed to chair/wheelchair/BSC Sit to Stand: Min assist   Step pivot transfers: Contact guard assist, Min assist       General transfer comment: Multiple attempts needed for STS this date due to general LE weakness. completed with min A. CGA during bed>chair tfs w/ RW    Ambulation/Gait Ambulation/Gait assistance: Contact guard assist Gait Distance (Feet): 3 Feet Assistive device: Rolling walker (2 wheels) Gait Pattern/deviations: Decreased step length - right, Decreased step length - left, Decreased stride length Gait velocity: Dec     General Gait Details: Pt limited to distance from bed to recliner due to fatigue/general weakness   Stairs         Wheelchair Mobility     Tilt Bed  Modified Rankin (Stroke Patients Only)       Balance Overall balance assessment: Needs assistance Sitting-balance support: Feet supported, Feet unsupported, Single extremity supported, Bilateral upper extremity supported Sitting  balance-Leahy Scale: Good Sitting balance - Comments: Fair/good seated EOB, w/ varying UE/LE support   Standing balance support: Bilateral upper extremity supported, During functional activity, Reliant on assistive device for balance Standing balance-Leahy Scale: Fair Standing balance comment: Fair/good w/ RW and CGA            Communication Communication Communication: No apparent difficulties  Cognition Arousal: Alert Behavior During Therapy: WFL for tasks assessed/performed   PT - Cognitive impairments: No apparent impairments       Following commands: Intact      Cueing Cueing Techniques: Verbal cues  Exercises General Exercises - Lower Extremity Ankle Circles/Pumps: AROM, Strengthening, Both, 10 reps, Seated Long Arc Quad: AROM, Strengthening, Both, 10 reps, Seated    General Comments        Pertinent Vitals/Pain Pain Assessment Pain Assessment: No/denies pain    Home Living                          Prior Function            PT Goals (current goals can now be found in the care plan section) Acute Rehab PT Goals Patient Stated Goal: Return home with HHPT services and bariatric RW PT Goal Formulation: With patient Time For Goal Achievement: 05/23/24 Potential to Achieve Goals: Good Progress towards PT goals: Progressing toward goals    Frequency    Min 3X/week      PT Plan      Co-evaluation              AM-PAC PT "6 Clicks" Mobility   Outcome Measure  Help needed turning from your back to your side while in a flat bed without using bedrails?: A Lot Help needed moving from lying on your back to sitting on the side of a flat bed without using bedrails?: A Lot Help needed moving to and from a bed to a chair (including a wheelchair)?: A Little Help needed standing up from a chair using your arms (e.g., wheelchair or bedside chair)?: A Little Help needed to walk in hospital room?: A Little Help needed climbing 3-5 steps with a  railing? : A Lot 6 Click Score: 15    End of Session Equipment Utilized During Treatment: Gait belt Activity Tolerance: Patient tolerated treatment well;Patient limited by fatigue Patient left: in chair;with call bell/phone within reach;with chair alarm set Nurse Communication: Mobility status PT Visit Diagnosis: Difficulty in walking, not elsewhere classified (R26.2);Other abnormalities of gait and mobility (R26.89);Pain;Muscle weakness (generalized) (M62.81);History of falling (Z91.81) Pain - part of body: Ankle and joints of foot     Time: 1140-1203 PT Time Calculation (min) (ACUTE ONLY): 23 min  Charges:    $Therapeutic Activity: 23-37 mins PT General Charges $$ ACUTE PT VISIT: 1 Visit                     12:33 PM, 05/20/24 Lace Chenevert Powell-Butler, PT, DPT [place] with [facility]

## 2024-05-20 NOTE — Evaluation (Signed)
 Occupational Therapy Evaluation Patient Details Name: Maureen Jackson MRN: [id number] DOB: 30-Aug-1956 Today's Date: 05/20/2024   History of Present Illness   Maureen Jackson  is a 68 y.o. 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  - In ED chest x-ray significant for enlarged cardiac silhouette, possible pulmonary edema, she has elevated BNP, bedside ultrasound by ED physician with finding of small pericardial effusion, she was started on IV lasix , ED discussed with cardiology at [facility], who recommended admission to [facility] and to obtain echo and to diurese. (per MD)     Clinical Impressions Pt agreeable to OT evaluation. Pt assisted for bathing, dressing, and toileting at baseline. Pt required CGA for sit to stand from the chair with the RW today. WFL A/ROM of B UE. Pt reports one fall in the past 6 months. Pt left in the chair with call bell within reach and chair alarm set. BSC recommended given that the pt has had some trouble with sit to stand while in the hospital. Pt will benefit from continued OT in the hospital and recommended venue below to increase strength, balance, and endurance for safe ADL's.        If plan is discharge home, recommend the following:   A little help with walking and/or transfers;A lot of help with bathing/dressing/bathroom;Assistance with cooking/housework;Assist for transportation;Help with stairs or ramp for entrance     Functional Status Assessment    Patient has had a recent decline in their functional status and demonstrates the ability to make significant improvements in function in a reasonable and predictable amount of time.     Equipment Recommendations   BSC/3in1             Precautions/Restrictions   Precautions Precautions: Fall Recall of Precautions/Restrictions: Intact Restrictions Weight Bearing Restrictions Per Provider Order: No     Mobility Bed Mobility               General bed mobility comments: Pt seated in the recliner to start the session.    Transfers Overall transfer level: Needs assistance Equipment used: Rolling walker (2 wheels) Transfers: Sit to/from Stand Sit to Stand: Contact guard assist           General transfer comment: CGA for one sit to stand from the chair; pt reluctant to do more than sit to stand.      Balance Overall balance assessment: Needs assistance Sitting-balance support: Feet supported, Bilateral upper extremity supported Sitting balance-Leahy Scale: Good Sitting balance - Comments: in recliner   Standing balance support: Bilateral upper extremity supported, During functional activity, Reliant on assistive device for balance Standing balance-Leahy Scale: Fair Standing balance comment: using RW                           ADL either performed or assessed with clinical judgement   ADL Overall ADL's : Needs assistance/impaired     Grooming: Set up;Sitting   Upper Body Bathing: Sitting;Minimal assistance   Lower  Body Bathing: Maximal assistance;Sitting/lateral leans   Upper Body Dressing : Set up;Sitting   Lower Body Dressing: Maximal assistance;Sitting/lateral leans   Toilet Transfer: Contact guard assist;Rolling walker (2 wheels) Toilet Transfer Details (indicate cue type and reason): Partially simulated via sit to stand from recliner. Toileting- Clothing Manipulation and Hygiene: Moderate assistance;Sitting/lateral lean;Maximal  assistance               Vision Baseline Vision/History: 1 Wears glasses Ability to See in Adequate Light: 1 Impaired Patient Visual Report: No change from baseline Vision Assessment?: No apparent visual deficits     Perception Perception: Not tested       Praxis Praxis: Not tested       Pertinent Vitals/Pain Pain Assessment Pain Assessment: Faces Faces Pain Scale: No hurt     Extremity/Trunk Assessment Upper Extremity Assessment Upper Extremity Assessment: Generalized weakness   Lower Extremity Assessment Lower Extremity Assessment: Defer to PT evaluation   Cervical / Trunk Assessment Cervical / Trunk Assessment: Kyphotic   Communication Communication Communication: No apparent difficulties   Cognition Arousal: Alert Behavior During Therapy: WFL for tasks assessed/performed Cognition: No apparent impairments                               Following commands: Intact       Cueing  General Comments   Cueing Techniques: Verbal cues                 Home Living Family/patient expects to be discharged to:: Private residence Living Arrangements: Alone Available Help at Discharge: Personal care attendant Type of Home: Apartment Home Access: Level entry     Home Layout: One level     Bathroom Shower/Tub: Chief Strategy Officer: Standard Bathroom Accessibility: Yes   Home Equipment: Grab bars - tub/shower;Cane - single point;Rollator (4 wheels)   Additional Comments: Pt reports her daughter is her PCA, helps her with ADLs, and iADLs. She comes everyday except wednesdays and the weekend, 5hrs/day. Also has church members who come and help her when needed (per PT note)      Prior Functioning/Environment Prior Level of Function : Needs assist;History of Falls (last six months) (1 fall in the past 6 months)       Physical Assist : Mobility (physical);ADLs (physical)   ADLs (physical):  Dressing;Bathing;IADLs;Toileting Mobility Comments: household ambulation with AD, rollator or SPC ADLs Comments: PCA 4 days/week for ADLs and iADLs; pt reports getting "a lot" of assist for bathing, dressing, and toileting.    OT Problem List: Decreased strength;Decreased activity tolerance;Impaired balance (sitting and/or standing);Obesity   OT Treatment/Interventions: Self-care/ADL training;Therapeutic exercise;Therapeutic activities;Patient/family education;Balance training      OT Goals(Current goals can be found in the care plan section)   Acute Rehab OT Goals Patient Stated Goal: return home OT Goal Formulation: With patient Time For Goal Achievement: 06/03/24 Potential to Achieve Goals: Good   OT Frequency:  Min 1X/week                                   End of Session Equipment Utilized During Treatment: Rolling walker (2 wheels)  Activity Tolerance: Patient tolerated treatment well Patient left: in chair;with call bell/phone within reach;with chair alarm set  OT Visit Diagnosis: Unsteadiness on feet (R26.81);Other abnormalities of gait and mobility (R26.89);Muscle weakness (generalized) (M62.81);History of falling (Z91.81)  Time: 7829-5621 OT Time Calculation (min): 9 min Charges:  OT General Charges $OT Visit: 1 Visit OT Evaluation $OT Eval Low Complexity: 1 Low  Amairany Schumpert OT, MOT  Thurnell Floss 05/20/2024, 2:40 PM

## 2024-05-20 NOTE — Plan of Care (Signed)
  Problem: Clinical Measurements: Goal: Cardiovascular complication will be avoided Outcome: Progressing   Problem: Activity: Goal: Risk for activity intolerance will decrease Outcome: Progressing   Problem: Education: Goal: Ability to demonstrate management of disease process will improve Outcome: Progressing   Problem: Activity: Goal: Capacity to carry out activities will improve Outcome: Progressing   Problem: Cardiac: Goal: Ability to achieve and maintain adequate cardiopulmonary perfusion will improve Outcome: Progressing   Problem: Metabolic: Goal: Ability to maintain appropriate glucose levels will improve Outcome: Progressing

## 2024-05-20 NOTE — Progress Notes (Signed)
 PROGRESS NOTE   Maureen Jackson  WUJ:[id number] DOB: May 26, 1956 DOA: 05/16/2024 PCP: Veda Gerald, MD   Chief Complaint  Patient presents with   Shortness of Breath   Level of care: Telemetry  Brief Admission History:  68 y.o. female,  with HFrEF 2/2 ischemic cardiomyopathy, hypothyroidism, CAD s/p PCI to the LAD and RCA , PAD, HTN, T2DM, OSA not on CPAP, CKD and morbid obesity/nonischemic cardiomyopathy with a EF 35%, history of pericardial effusion status post pericardial window.  Patient presents to ED secondary to complaints of shortness of breath, she denies any chest pain, fever, chills, reports shortness of breath progressive over the last week, but significant today which prompted her to come to ED, patient report she has not been taking her Lasix .  In ED chest x-ray significant for enlarged cardiac silhouette, possible pulmonary edema, she has elevated BNP, bedside ultrasound by ED physician with finding of small pericardial effusion, she was started on IV lasix , ED discussed with cardiology at [facility], who recommended admission to [facility] and to obtain echo and to diurese.   Assessment and Plan:  Acute on chronic HFrEF Ischemic and nonischemic cardiomyopathy Pulmonary edema -pt admits she is not taking lasix  at home because it causes frequent urination and she is mostly immobile.    -she was started on IV Lasix  60 mg IV twice daily but now held due to bump in creatinine, following daily weights, strict ins and out -Follow up 2D echo: no significant pericardial effusion, LVEF remains 30-35% with global hypokinesis, grade 1 DD, aortic valve sclerosis with no aortic valve stenosis. -repeated CXR shows modest improvement in interstitial edema -holding diuretics due to bump in creatinine -hold potassium supplementation -recheck BMP in AM  -pt reports dry weight is about 340#  -encouraged sodium restriction 2 gm/day and fluid restriction   Filed Weights   05/18/24  0547 05/19/24 0445 05/20/24 0523  Weight: (!) 158.8 kg (!) 160.1 kg (!) 160.8 kg    Intake/Output Summary (Last 24 hours) at 05/20/2024 1142 Last data filed at 05/20/2024 9562 Gross per 24 hour  Intake 358 ml  Output 1200 ml  Net -842 ml   History of pericardial effusion s/p window - History of pericardial effusion, failed pericardiocentesis; s/p window in 12/2022 - Chest x-ray with increased cardiac silhouette, but bedside echo by ED physician with no evidence of large pleural effusion, as well she is status post pericardial window which should prevent recurrence of effusion - follow up repeat 2D echo with report of small pericardial effusion present - outpatient providers felt to be secondary to hypothyroidism which remains uncontrolled as evidenced by TSH 135.5 and free T4<0.25.    DVT right post tibial vein Right leg pain  - suspect precipitated by prolonged immobility  - pharm D dosed apixaban started 05/17/24  - appreciate cardiology team recs, for length of DVT treatment, take aspirin  81 mg and hold plavix  - resume plavix  and stop aspirin  after completing 3 months treatment for DVT - counseled patient regarding high bleeding risk   CAD - outpatient cardiology records indicate she had LHC in 2018 demonstrating severe multivessel coronary artery disease. Patient underwent PCI as opposed to CABG with DES to the LAD, x 2 to the RCA and left circumflex disease managed medically.  - continue Lipitor , Toprol , holding plavix  during DVT treatment and taking aspirin  81 mg daily - She denies any chest pain  - No chest pain.    AKI on CKD stage 3b -  holding diuretics - creatinine down to 1.99   Morbid obesity -Body mass index is 56.25 kg/m. -pt reports she has been on Ozempic since 2023   Chronic pain of morbid obesity - ordered a bariatric bed - ordered PRN meds for pain   Hypertension - Continue with home regimen   Diabetes mellitus, type II -She is on Jardiance  and Ozempic at  home, resumed Jardiance , Ozempic is nonformulary, continue insulin  sliding scale CBG (last 3)  Recent Labs    05/19/24 2054 05/20/24 0749 05/20/24 1119  GLUCAP 122* 102* 139*   Hypothyroidism, post thyroidectomy - uncontrolled as evidenced by TSH 135  - checked free T4---<0.25  - pt insistent that she takes levothyroxine  daily - possibly not absorbing the drug but I worry about compliance - compliance is a concern as she tells me she does not take it regularly, not on empty stomach and takes it with her other pills when she does manage to take it. I counseled her at length about why she is not feeling better and the need to take the medication correctly.  - she says she is followed by endocrinology and she is on 250 mcg of levothyroxine  daily which is the correct dose based on her weight of 256 mcg/day and she says she has been on this dose for longer than 1 year - last visit with endocrinology was in May 2023: she was counseled regarding the correct intake of her thyroid  hormone, on empty stomach at fasting, with water, separated by at least 30 minutes from breakfast and other medications,  and separated by more than 4 hours from calcium , iron , multivitamins, acid reflux medications (PPIs).    Deconditioning - consulted PT, OT - up to chair today - pt reports she will consider SNF rehab but not certain at this time - she reports that she lives alone.   DVT prophylaxis: apixaban Code Status: Full  Communication: discussed plan of care with patient at bedside, she verbalized understanding Disposition: anticipating home with home health   Consultants:  cardiology  Procedures:   Antimicrobials:    Subjective: Pt reports she is not receiving her pain meds, she says she remains very short of breath.    Objective: Vitals:   05/19/24 1217 05/19/24 1500 05/19/24 1936 05/20/24 0523  BP: 121/81 101/69 109/62 115/66  Pulse: 70 66 98 61  Resp:    20  Temp:  97.8 F (36.6 C) 98.1 F  (36.7 C) 97.8 F (36.6 C)  TempSrc:  Oral Oral Oral  SpO2:  94% 93% 93%  Weight:    (!) 160.8 kg  Height:        Intake/Output Summary (Last 24 hours) at 05/20/2024 1141 Last data filed at 05/20/2024 9147 Gross per 24 hour  Intake 358 ml  Output 1200 ml  Net -842 ml   Filed Weights   05/18/24 0547 05/19/24 0445 05/20/24 0523  Weight: (!) 158.8 kg (!) 160.1 kg (!) 160.8 kg   Examination:  General exam: morbidly obese female, NAD, Appears calm but Uncomfortable  Respiratory system: Clear to auscultation. Respiratory effort normal. Cardiovascular system: normal S1 & S2 heard. No JVD, murmurs, rubs, gallops or clicks. No pedal edema. Gastrointestinal system: Abdomen is nondistended, soft and nontender. No organomegaly or masses felt. Normal bowel sounds heard. Central nervous system: Alert and oriented. No focal neurological deficits. Extremities: 2+ peripheral edema.  Symmetric 5 x 5 power. Skin: No rashes, lesions or ulcers. Psychiatry: Judgement and insight appear poor. Mood & affect flat.  Data Reviewed: I have personally reviewed following labs and imaging studies  CBC: Recent Labs  Lab 05/16/24 1536 05/17/24 0652 05/19/24 0606 05/20/24 0428  WBC 5.6 5.7 6.8 7.0  HGB 10.9* 10.9* 10.6* 10.8*  HCT 35.6* 35.9* 34.8* 35.7*  MCV 87.0 88.2 91.6 90.8  PLT 208 194 217 225    Basic Metabolic Panel: Recent Labs  Lab 05/17/24 0652 05/18/24 0255 05/19/24 0606 05/19/24 1200 05/20/24 0428  NA 144 141 141 140 141  K 3.8 4.5 4.7 4.5 4.8  CL 108 106 106 106 105  CO2 27 28 24 27 29   GLUCOSE 117* 129* 136* 144* 128*  BUN 21 26* 35* 35* 36*  CREATININE 1.42* 2.10* 2.32* 2.33* 1.99*  CALCIUM  7.9* 7.9* 8.1* 8.2* 8.3*  MG  --   --  2.1  --   --     CBG: Recent Labs  Lab 05/19/24 1106 05/19/24 1711 05/19/24 2054 05/20/24 0749 05/20/24 1119  GLUCAP 130* 126* 122* 102* 139*    Recent Results (from the past 240 hours)  Resp panel by RT-PCR (RSV, Flu A&B, Covid)  Anterior Nasal Swab     Status: None   Collection Time: 05/16/24  3:10 PM   Specimen: Anterior Nasal Swab  Result Value Ref Range Status   SARS Coronavirus 2 by RT PCR NEGATIVE NEGATIVE Final    Comment: (NOTE) SARS-CoV-2 target nucleic acids are NOT DETECTED.  The SARS-CoV-2 RNA is generally detectable in upper respiratory specimens during the acute phase of infection. The lowest concentration of SARS-CoV-2 viral copies this assay can detect is 138 copies/mL. A negative result does not preclude SARS-Cov-2 infection and should not be used as the sole basis for treatment or other patient management decisions. A negative result may occur with  improper specimen collection/handling, submission of specimen other than nasopharyngeal swab, presence of viral mutation(s) within the areas targeted by this assay, and inadequate number of viral copies(<138 copies/mL). A negative result must be combined with clinical observations, patient history, and epidemiological information. The expected result is Negative.  Fact Sheet for Patients:  BloggerCourse.com  Fact Sheet for Healthcare Providers:  SeriousBroker.it  This test is no t yet approved or cleared by the [place]  FDA and  has been authorized for detection and/or diagnosis of SARS-CoV-2 by FDA under an Emergency Use Authorization (EUA). This EUA will remain  in effect (meaning this test can be used) for the duration of the COVID-19 declaration under Section 564(b)(1) of the Act, 21 U.S.C.section 360bbb-3(b)(1), unless the authorization is terminated  or revoked sooner.       Influenza A by PCR NEGATIVE NEGATIVE Final   Influenza B by PCR NEGATIVE NEGATIVE Final    Comment: (NOTE) The Xpert Xpress SARS-CoV-2/FLU/RSV plus assay is intended as an aid in the diagnosis of influenza from Nasopharyngeal swab specimens and should not be used as a sole basis for treatment. Nasal washings  and aspirates are unacceptable for Xpert Xpress SARS-CoV-2/FLU/RSV testing.  Fact Sheet for Patients: BloggerCourse.com  Fact Sheet for Healthcare Providers: SeriousBroker.it  This test is not yet approved or cleared by the [place]  FDA and has been authorized for detection and/or diagnosis of SARS-CoV-2 by FDA under an Emergency Use Authorization (EUA). This EUA will remain in effect (meaning this test can be used) for the duration of the COVID-19 declaration under Section 564(b)(1) of the Act, 21 U.S.C. section 360bbb-3(b)(1), unless the authorization is terminated or revoked.     Resp Syncytial Virus by PCR NEGATIVE  NEGATIVE Final    Comment: (NOTE) Fact Sheet for Patients: BloggerCourse.com  Fact Sheet for Healthcare Providers: SeriousBroker.it  This test is not yet approved or cleared by the [place]  FDA and has been authorized for detection and/or diagnosis of SARS-CoV-2 by FDA under an Emergency Use Authorization (EUA). This EUA will remain in effect (meaning this test can be used) for the duration of the COVID-19 declaration under Section 564(b)(1) of the Act, 21 U.S.C. section 360bbb-3(b)(1), unless the authorization is terminated or revoked.  Performed at [facility], [address], [place], [place] [address]      Radiology Studies: DG CHEST PORT 1 VIEW Result Date: 05/19/2024 CLINICAL DATA:  Pulmonary edema EXAM: PORTABLE CHEST 1 VIEW COMPARISON:  Chest x-ray performed May 16, 2024 FINDINGS: Modest improvement in interstitial edema. Enlarged heart. Limited visualization of the retrocardiac region. IMPRESSION: 1. Modest improvement in interstitial edema. 2. Enlarged heart. Electronically Signed   By: Reagan Camera M.D.   On: 05/19/2024 08:25    Scheduled Meds:  apixaban  10 mg Oral BID   Followed by   Cecily Cohen ON 05/24/2024] apixaban  5 mg Oral BID    aspirin  EC  81 mg Oral Daily   atorvastatin   20 mg Oral Daily   brimonidine   1 drop Both Eyes BID   And   timolol   1 drop Both Eyes BID   calcium  carbonate  4 tablet Oral BID WC   insulin  aspart  0-15 Units Subcutaneous TID WC   levothyroxine   250 mcg Oral QAC breakfast   metoprolol  succinate  25 mg Oral Daily   sacubitril -valsartan   1 tablet Oral BID   Continuous Infusions:   LOS: 0 days   Time spent: 55 mins  Elasia Furnish Lincoln Renshaw, MD How to contact the [facility] Attending or Consulting provider 7A - 7P or covering provider during after hours 7P -7A, for this patient?  Check the care team in [facility] and look for a) attending/consulting TRH provider listed and b) the TRH team listed Log into [email or web address] to find provider on call.  Locate the TRH provider you are looking for under [facility] and page to a number that you can be directly reached. If you still have difficulty reaching the provider, please page the [facility] (Director on Call) for the Hospitalists listed on amion for assistance.  05/20/2024, 11:41 AM

## 2024-05-20 NOTE — Progress Notes (Signed)
 Heart Failure Navigator Progress Note  Patient meets criteria for the [facility] due to her CHF diagnosis. Patient also in agreement of using this resource per telephone conversation on 05/19/24.  Application was filled out and sent to [employer] on 05/20/24.  She will provide Dr. Teddie Favre with the forms for his rounds on 05/21/2024 to get signed by both patient and Dr. Londa Rival.  Shanice to fax the final forms to the number provided on the form after completion.  Navigator available for any concerns during this process.  Celedonio Coil, RN, BSN [facility] Heart Failure Navigator Secure Chat Only

## 2024-05-20 NOTE — Plan of Care (Signed)
  Problem: Acute Rehab OT Goals (only OT should resolve) Goal: Pt. Will Perform Grooming Flowsheets (Taken 05/20/2024 1443) Pt Will Perform Grooming:  with modified independence  standing Goal: Pt. Will Perform Lower Body Dressing Flowsheets (Taken 05/20/2024 1443) Pt Will Perform Lower Body Dressing:  with min assist  sitting/lateral leans  with adaptive equipment Goal: Pt. Will Transfer To Toilet Flowsheets (Taken 05/20/2024 1443) Pt Will Transfer to Toilet:  with modified independence  ambulating Goal: Pt. Will Perform Toileting-Clothing Manipulation Flowsheets (Taken 05/20/2024 1443) Pt Will Perform Toileting - Clothing Manipulation and hygiene:  with modified independence  sitting/lateral leans Goal: Pt/Caregiver Will Perform Home Exercise Program Flowsheets (Taken 05/20/2024 1443) Pt/caregiver will Perform Home Exercise Program:  Increased strength  Both right and left upper extremity  Independently  Dava Rensch OT, MOT

## 2024-05-20 NOTE — Progress Notes (Signed)
 Heart Failure Stewardship Pharmacy Note  PCP: Veda Gerald, MD PCP-Cardiologist: Armida Lander, MD  HPI: Maureen Jackson is a 68 y.o. female with CHF, ischemic cardiomyopathy, hypothyroidism, CAD, PAD, HTN, T2DM, OSA, CKD, obesity, history of pericardial effusion who presented with progressive shortness of breath over the past week. On admission, BNP was 141, HS-troponin was 25, A1c was 7.4, free T4 was <0.25, and TSH was 135.541. Chest x-ray noted likely interstitial edema. Doppler positive for DVT involving the right posterior tibial vein.   Pertinent cardiac history: Echocardiogram at that time with EF of 30 to 35% with left heart cath demonstrating severe multivessel coronary artery disease. Patient underwent PCI as opposed to CABG with DES to the LAD and  RCA with medical management of the left circumflex. In 2021 EF dropped to 30% with recovery to 40 to 50% in April 2023. Patient underwent unsuccessful pericardiocentesis for a large effusion in January 2024 eventually requiring creation of a pericardial window. Echocardiogram 05/2024 showed LVEF reduced again to 30-35%, moderate LVH, grade I diastolic dysfunction, small pericardial effusion.  Pertinent Lab Values: Creatinine, Ser  Date Value Ref Range Status  05/20/2024 1.99 (H) 0.44 - 1.00 mg/dL Final   BUN  Date Value Ref Range Status  05/20/2024 36 (H) 8 - 23 mg/dL Final  91/47/8295 19 8 - 27 mg/dL Final   Potassium  Date Value Ref Range Status  05/20/2024 4.8 3.5 - 5.1 mmol/L Final   Sodium  Date Value Ref Range Status  05/20/2024 141 135 - 145 mmol/L Final  02/21/2023 144 134 - 144 mmol/L Final   B Natriuretic Peptide  Date Value Ref Range Status  05/17/2024 94.0 0.0 - 100.0 pg/mL Final    Comment:    Performed at [facility], [address] [place], [place] [address]   Magnesium   Date Value Ref Range Status  05/19/2024 2.1 1.7 - 2.4 mg/dL Final    Comment:    Performed at [facility], [address]  [place], [place] [address]   HbA1c, POC (controlled diabetic range)  Date Value Ref Range Status  04/19/2022 7.3 (A) 0.0 - 7.0 % Final   Hgb A1c MFr Bld  Date Value Ref Range Status  05/17/2024 7.4 (H) 4.8 - 5.6 % Final    Comment:    (NOTE)         Prediabetes: 5.7 - 6.4         Diabetes: >6.4         Glycemic control for adults with diabetes: <7.0    Digoxin Level  Date Value Ref Range Status  05/17/2020 <0.2 (L) 0.8 - 2.0 ng/mL Final    Comment:    RESULTS CONFIRMED BY MANUAL DILUTION Performed at [facility], [address] [place], [place] [address]    TSH  Date Value Ref Range Status  05/16/2024 135.541 (H) 0.350 - 4.500 uIU/mL Final    Comment:    Performed by a 3rd Generation assay with a functional sensitivity of <=0.01 uIU/mL. Performed at [facility], [address] [place], [place] [address]   03/04/2013 16.106 (H) 0.350 - 4.500 uIU/mL Final   LDH  Date Value Ref Range Status  12/26/2019 255 (H) 98 - 192 U/L Final    Comment:    Performed at [facility], [address], [place], [place] [address]    Vital Signs: Temp:  [97.8 F (36.6 C)-98.1 F (36.7 C)] 97.8 F (36.6 C) (06/10 1317) Pulse Rate:  [61-98] 72 (06/10 1317)  Cardiac Rhythm: Normal sinus rhythm;Bundle branch block (06/10 0702) Resp:  [20] 20 (06/10 0523) BP: (109-125)/(62-70) 125/70 (06/10 1317) SpO2:  [93 %] 93 % (06/10 1317) Weight:  [160.8 kg (354 lb 8 oz)] 160.8 kg (354 lb 8 oz) (06/10 0523)  Intake/Output Summary (Last 24 hours) at 05/20/2024 1505 Last data filed at 05/20/2024 1304 Gross per 24 hour  Intake 598 ml  Output 1200 ml  Net -602 ml    Current Heart Failure Medications:  Loop diuretic: none Beta-Blocker: metoprolol  succinate 25 mg daily ACEI/ARB/ARNI: Entresto  49-51 mg BID MRA: none SGLT2i: none Other: none  Prior to admission Heart Failure Medications:  Loop diuretic: not taking regularly (patient has not filled since 11/2023) Beta-Blocker: metoprolol  succinate 25  mg daily ACEI/ARB/ARNI: Entresto  97-103 mg BID MRA: none (patient has not filled since 11/2023) SGLT2i: Jardiance  25 mg daily Other: semaglutide  Assessment: 1. Acute on chronic combined systolic and diastolic heart failure (LVEF 30-35%) with grade I diastolic dysfunction, due to ICM. -Hemodynamics: BP lower end of normal. HR 60s. -BB: Continue metoprolol  succinate 25 mg daily. -ACEI/ARB/ARNI: Continue Entresto  49-51 mg BID -MRA: Spironolactone  stopped due to advanced CKD. Could consider finerenone instead for CHF and CKD if renal function remains stable. -SGLT2i:Noted history of labial abscess vs fournier's gangrene. Since that time has been on Jardiance  and seemingly tolerated it well, though there is a risk of possible fournier's gangrene. -HF stewardship is based at [facility] and the patient was evaluated based on chart review. Two attempts made to reach the patient via telephone, however, no response.  Plan: 1) Medication changes recommended at this time: -None  2) Patient assistance: -Copay for Entresto , Farxiga, and Jardiance  is $0  3) Education: -To be completed prior to discharge.  Medication Assistance / Insurance Benefits Check: Does the patient have prescription insurance?    Type of insurance plan:  Does the patient qualify for medication assistance through manufacturers or grants? No  Outpatient Pharmacy: Prior to admission outpatient pharmacy: [facility]      Please do not hesitate to reach out with questions or concerns,  Bevely Brush, PharmD, CPP, BCPS Heart Failure Pharmacist  Phone - [phone] 05/20/2024 3:05 PM

## 2024-05-20 NOTE — Progress Notes (Signed)
 Progress Note  Patient Name: Maureen Jackson Date of Encounter: 05/20/2024  Primary Cardiologist: Armida Lander, MD  Interval Summary  Chart reviewed.  Medications were adjusted yesterday with diuretics held, creatinine has come down to 1.99.  She has auto-diuresed approximately 500 cc in the last 24 hours.  Vital Signs  Vitals:   05/19/24 1217 05/19/24 1500 05/19/24 1936 05/20/24 0523  BP: 121/81 101/69 109/62 115/66  Pulse: 70 66 98 61  Resp:    20  Temp:  97.8 F (36.6 C) 98.1 F (36.7 C) 97.8 F (36.6 C)  TempSrc:  Oral Oral Oral  SpO2:  94% 93% 93%  Weight:    (!) 160.8 kg  Height:        Intake/Output Summary (Last 24 hours) at 05/20/2024 0942 Last data filed at 05/20/2024 8119 Gross per 24 hour  Intake 358 ml  Output 1200 ml  Net -842 ml   Filed Weights   05/18/24 0547 05/19/24 0445 05/20/24 0523  Weight: (!) 158.8 kg (!) 160.1 kg (!) 160.8 kg    Physical Exam  GEN: No acute distress.   Neck: Difficult to assess JVP. Cardiac: Indistinct PMI, RRR without gallop.  Respiratory: Nonlabored. Clear to auscultation bilaterally. GI: Soft, nontender, bowel sounds present. MS: Stable lower leg edema.  ECG/Telemetry  Telemetry reviewed showing sinus rhythm.  Labs  Chemistry Recent Labs  Lab 05/16/24 1536 05/17/24 0652 05/19/24 0606 05/19/24 1200 05/20/24 0428  NA 142   < > 141 140 141  K 3.8   < > 4.7 4.5 4.8  CL 108   < > 106 106 105  CO2 26   < > 24 27 29   GLUCOSE 132*   < > 136* 144* 128*  BUN 19   < > 35* 35* 36*  CREATININE 1.27*   < > 2.32* 2.33* 1.99*  CALCIUM  8.1*   < > 8.1* 8.2* 8.3*  PROT 7.0  --   --   --   --   ALBUMIN  3.1*  --   --   --   --   AST 42*  --   --   --   --   ALT 31  --   --   --   --   ALKPHOS 248*  --   --   --   --   BILITOT 1.1  --   --   --   --   GFRNONAA 47*   < > 23* 23* 27*  ANIONGAP 8   < > 11 7 7    < > = values in this interval not displayed.    Hematology Recent Labs  Lab 05/17/24 0652  05/19/24 0606 05/20/24 0428  WBC 5.7 6.8 7.0  RBC 4.07 3.80* 3.93  HGB 10.9* 10.6* 10.8*  HCT 35.9* 34.8* 35.7*  MCV 88.2 91.6 90.8  MCH 26.8 27.9 27.5  MCHC 30.4 30.5 30.3  RDW 14.9 15.2 14.9  PLT 194 217 225   Cardiac Enzymes Recent Labs  Lab 05/16/24 1536 05/16/24 1713  TROPONINIHS 25* 29*   Cardiac Studies  Echocardiogram 05/17/2024:  1. Left ventricular ejection fraction, by estimation, is 30 to 35%. The  left ventricle has moderately decreased function. The left ventricle  demonstrates global hypokinesis. The left ventricular internal cavity size  was moderately dilated. There is  moderate left ventricular hypertrophy. Left ventricular diastolic  parameters are consistent with Grade I diastolic dysfunction (impaired  relaxation).   2. Right ventricular systolic function is normal.  The right ventricular  size is normal.   3. Left atrial size was mildly dilated.   4. A small pericardial effusion is present. The pericardial effusion is  posterior to the left ventricle and anterior to the right ventricle.   5. The mitral valve is abnormal. No evidence of mitral valve  regurgitation. No evidence of mitral stenosis. Moderate mitral annular  calcification.   6. The aortic valve is tricuspid. There is mild calcification of the  aortic valve. There is mild thickening of the aortic valve. Aortic valve  regurgitation is not visualized. Aortic valve sclerosis is present, with  no evidence of aortic valve stenosis.   7. The inferior vena cava is normal in size with greater than 50%  respiratory variability, suggesting right atrial pressure of 3 mmHg.   Assessment & Plan  1.  HFrEF, LVEF 30 to 35% with global hypokinesis and normal RV contraction by recent follow-up echocardiogram.  Has had element of fluid overload in the setting of medication noncompliance including diuretics and also severe hypothyroidism.  2.  CAD status post DES to the LAD and RCA in 2018 with medically  managed circumflex disease.  No active angina.  Cardiac enzymes are not consistent with ACS.  3.  Acute on chronic renal failure, creatinine up to 2.33 following resumption of outpatient regimen and IV diuretics.  Baseline CKD stage IIIb.  Creatinine has come down to 1.99.  4.  Acute right-sided DVT involving posterior tibial vein.  Started on Eliquis by primary team.  She has been taken off Plavix  (for CAD) and started on aspirin  81 mg daily.  5.  Hypothyroidism status post thyroidectomy.  Current TSH 135 consistent with inadequate supplementation.  Now on Synthroid  250 mcg daily per primary team.  Continue aspirin  81 mg daily, Eliquis at DVT treatment dose, Lipitor  20 mg daily, Toprol -XL 25 mg daily, and Entresto  49/51 mg twice daily.  Hold off on resuming Lasix  and Aldactone  at this time.  Adding back Jardiance  at 10 mg daily for now.  Recommend PT evaluation.  For questions or updates, please contact [facility] Please consult [email or web address] for contact info under   Signed, Teddie Favre, MD  05/20/2024, 9:42 AM

## 2024-05-20 NOTE — Plan of Care (Signed)
  Problem: Acute Rehab PT Goals(only PT should resolve) Goal: Pt Will Go Supine/Side To Sit Outcome: Progressing Flowsheets (Taken 05/20/2024 1235) Pt will go Supine/Side to Sit: Independently Goal: Patient Will Transfer Sit To/From Stand Outcome: Progressing Flowsheets (Taken 05/20/2024 1235) Patient will transfer sit to/from stand: with supervision Goal: Pt Will Transfer Bed To Chair/Chair To Bed Outcome: Progressing Flowsheets (Taken 05/20/2024 1235) Pt will Transfer Bed to Chair/Chair to Bed: with modified independence Goal: Pt Will Ambulate Outcome: Progressing Flowsheets (Taken 05/20/2024 1235) Pt will Ambulate:  with contact guard assist  15 feet  with rolling walker   12:36 PM, 05/20/24 Marysue Sola, PT, DPT [place] with [facility]

## 2024-05-21 DIAGNOSIS — I5023 Acute on chronic systolic (congestive) heart failure: Secondary | ICD-10-CM | POA: Diagnosis not present

## 2024-05-21 DIAGNOSIS — N1832 Chronic kidney disease, stage 3b: Secondary | ICD-10-CM | POA: Diagnosis not present

## 2024-05-21 DIAGNOSIS — I5020 Unspecified systolic (congestive) heart failure: Secondary | ICD-10-CM | POA: Diagnosis not present

## 2024-05-21 DIAGNOSIS — I2510 Atherosclerotic heart disease of native coronary artery without angina pectoris: Secondary | ICD-10-CM | POA: Diagnosis not present

## 2024-05-21 DIAGNOSIS — I3139 Other pericardial effusion (noninflammatory): Secondary | ICD-10-CM | POA: Diagnosis not present

## 2024-05-21 DIAGNOSIS — N179 Acute kidney failure, unspecified: Secondary | ICD-10-CM | POA: Diagnosis not present

## 2024-05-21 LAB — BASIC METABOLIC PANEL WITH GFR
Anion gap: 7 (ref 5–15)
BUN: 35 mg/dL — ABNORMAL HIGH (ref 8–23)
CO2: 27 mmol/L (ref 22–32)
Calcium: 8.6 mg/dL — ABNORMAL LOW (ref 8.9–10.3)
Chloride: 104 mmol/L (ref 98–111)
Creatinine, Ser: 1.74 mg/dL — ABNORMAL HIGH (ref 0.44–1.00)
GFR, Estimated: 32 mL/min — ABNORMAL LOW (ref >60)
Glucose, Bld: 138 mg/dL — ABNORMAL HIGH (ref 70–99)
Potassium: 4.5 mmol/L (ref 3.5–5.1)
Sodium: 138 mmol/L (ref 135–145)

## 2024-05-21 LAB — GLUCOSE, CAPILLARY
Glucose-Capillary: 119 mg/dL — ABNORMAL HIGH (ref 70–99)
Glucose-Capillary: 134 mg/dL — ABNORMAL HIGH (ref 70–99)
Glucose-Capillary: 113 mg/dL — ABNORMAL HIGH (ref 70–99)
Glucose-Capillary: 191 mg/dL — ABNORMAL HIGH (ref 70–99)

## 2024-05-21 LAB — CBC
HCT: 34.4 % — ABNORMAL LOW (ref 36.0–46.0)
Hemoglobin: 10.5 g/dL — ABNORMAL LOW (ref 12.0–15.0)
MCH: 27.6 pg (ref 26.0–34.0)
MCHC: 30.5 g/dL (ref 30.0–36.0)
MCV: 90.5 fL (ref 80.0–100.0)
Platelets: 224 10*3/uL (ref 150–400)
RBC: 3.8 MIL/uL — ABNORMAL LOW (ref 3.87–5.11)
RDW: 15 % (ref 11.5–15.5)
WBC: 7.2 10*3/uL (ref 4.0–10.5)
nRBC: 0 % (ref 0.0–0.2)

## 2024-05-21 LAB — MAGNESIUM: Magnesium: 2.3 mg/dL (ref 1.7–2.4)

## 2024-05-21 MED ORDER — SPIRONOLACTONE 12.5 MG HALF TABLET
12.5000 mg | ORAL_TABLET | Freq: Every day | ORAL | Status: DC
  Administered 2024-05-21 – 2024-05-25 (×5): 12.5 mg via ORAL
  Filled 2024-05-21 (×5): qty 1

## 2024-05-21 MED ORDER — ACETAMINOPHEN 500 MG PO TABS
1000.0000 mg | ORAL_TABLET | Freq: Three times a day (TID) | ORAL | Status: DC
  Administered 2024-05-21 – 2024-05-25 (×10): 1000 mg via ORAL
  Filled 2024-05-21 (×11): qty 2

## 2024-05-21 MED ORDER — EMPAGLIFLOZIN 10 MG PO TABS
10.0000 mg | ORAL_TABLET | Freq: Every day | ORAL | Status: DC
  Administered 2024-05-21 – 2024-05-25 (×5): 10 mg via ORAL
  Filled 2024-05-21 (×5): qty 1

## 2024-05-21 NOTE — TOC Progression Note (Signed)
 Transition of Care [facility]) - Progression Note    Patient Details  Name: Maureen Jackson MRN: [id number] Date of Birth: 1956-03-03  Transition of Care [facility]) CM/SW Contact  Cyndie Dredge, [place] Phone Number: 05/21/2024, 8:51 AM  Clinical Narrative:     This Clinical research associate reviewed the [facility] form with patient and then patient signed form. Cardiologist MD was made aware of form needing his signature during his visit with patient. Cardiologist also wanted to make sure that all communications made through this program go to her primary cardiologist Dr. Amanda Jungling and her advanced heart failure provider Dr. Bruce Caper. TOC to follow.  Expected Discharge Plan: Home w Home Health Services Barriers to Discharge: Continued Medical Work up  Expected Discharge Plan and Services In-house Referral: Clinical Social Work     Living arrangements for the past 2 months: Single Family Home                 DME Arranged: Otho Blitz wide DME Agency: [employer] Date DME Agency Contacted: 05/19/24 Time DME Agency Contacted: 1030 Representative spoke with at DME Agency: Gladys Lamp HH Arranged: PT HH Agency: [facility] Date [facility] Agency Contacted: 05/19/24 Time HH Agency Contacted: 1030 Representative spoke with at [facility] Agency: Randel Buss   Social Determinants of Health (SDOH) Interventions SDOH Screenings   Food Insecurity: No Food Insecurity (05/19/2024)  Housing: Low Risk  (05/19/2024)  Transportation Needs: Unmet Transportation Needs (05/19/2024)  Utilities: Not At Risk (05/16/2024)  Alcohol Screen: Low Risk  (12/22/2022)  Depression (PHQ2-9): Low Risk  (01/01/2020)  Financial Resource Strain: Low Risk  (05/19/2024)  Social Connections: Unknown (05/16/2024)  Tobacco Use: Medium Risk (05/19/2024)    Readmission Risk Interventions    05/21/2024    8:50 AM 05/20/2024    3:24 PM 12/21/2022    9:38 AM  Readmission Risk Prevention Plan  Transportation Screening Complete Complete Complete  Home  Care Screening Complete Complete Complete  Medication Review (RN CM) Complete Complete Complete

## 2024-05-21 NOTE — Plan of Care (Signed)
  Problem: Clinical Measurements: Goal: Cardiovascular complication will be avoided Outcome: Progressing   Problem: Education: Goal: Ability to demonstrate management of disease process will improve Outcome: Progressing   Problem: Activity: Goal: Capacity to carry out activities will improve Outcome: Progressing   Problem: Cardiac: Goal: Ability to achieve and maintain adequate cardiopulmonary perfusion will improve Outcome: Progressing   Problem: Metabolic: Goal: Ability to maintain appropriate glucose levels will improve Outcome: Progressing

## 2024-05-21 NOTE — Plan of Care (Signed)
   Problem: Education: Goal: Knowledge of General Education information will improve Description: Including pain rating scale, medication(s)/side effects and non-pharmacologic comfort measures Outcome: Progressing   Problem: Clinical Measurements: Goal: Ability to maintain clinical measurements within normal limits will improve Outcome: Progressing Goal: Diagnostic test results will improve Outcome: Progressing

## 2024-05-21 NOTE — Progress Notes (Signed)
 Patient has refused PT twice today stating she does not feel like getting out of bed. Patient then c/o her tailbone hurting from lying in bed for so long and requests to be rolled to left side. Patient is fully capable of standing and rolling without assistance however has become reliant on medical staff for all adl's and movement as she does not want to do things on her own . I tried to educate patient on importance of movement to her well being but she is unreceptive.

## 2024-05-21 NOTE — Progress Notes (Signed)
 Physical Therapy Treatment Patient Details Name: Maureen Jackson MRN: [id number] DOB: 03/04/1956 Today's Date: 05/21/2024   History of Present Illness Troi Bechtold  is a 68 y.o. 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  - In ED chest x-ray significant for enlarged cardiac silhouette, possible pulmonary edema, she has elevated BNP, bedside ultrasound by ED physician with finding of small pericardial effusion, she was started on IV lasix , ED discussed with cardiology at [facility], who recommended admission to [facility] and to obtain echo and to diurese.    PT Comments  Pt initially declined PT, educated on benefits with PT participation.  Pt c/o buttock pain.  Educated importance of pressure relief to reduce risk of bed sores.  Mod A and cueing for use of bedrails to assist with bed mobility.  Pt limited by fatigue easily, reports of SOB while holding pressure relief for 30, multiple rolls complete this session.  Assistance given to assist with sliding to HOB with use of railing.  Pt declined standing this session.  Pt left in bed with call bell within reach, no reports of pain at EOS.   If plan is discharge home, recommend the following:     Can travel by private vehicle        Equipment Recommendations       Recommendations for Other Services       Precautions / Restrictions Precautions Precautions: Fall Restrictions Weight Bearing Restrictions Per Provider Order: No     Mobility  Bed  Mobility Overal bed mobility: Needs Assistance Bed Mobility: Rolling   Sidelying to sit: Used rails, Mod assist       General bed mobility comments: Educated importance of pressure relief, verbal cueing to bend knee and reach arm across trunk to reach for railing    Transfers                        Ambulation/Gait                   Stairs             Wheelchair Mobility     Tilt Bed    Modified Rankin (Stroke Patients Only)       Balance                                            Communication    Cognition Arousal: Alert Behavior During Therapy: WFL for tasks assessed/performed   PT - Cognitive impairments: No apparent impairments                                Cueing    Exercises      General Comments        Pertinent Vitals/Pain Pain Assessment Pain Score: 2  Pain Location: Buttocks pain Pain Descriptors /  Indicators: Discomfort, Aching Pain Intervention(s): Limited activity within patient's tolerance, Monitored during session, Repositioned    Home Living                          Prior Function            PT Goals (current goals can now be found in the care plan section)      Frequency           PT Plan      Co-evaluation              AM-PAC PT 6 Clicks Mobility   Outcome Measure  Help needed turning from your back to your side while in a flat bed without using bedrails?: A Lot Help needed moving from lying on your back to sitting on the side of a flat bed without using bedrails?: A Lot Help needed moving to and from a bed to a chair (including a wheelchair)?: A Little Help needed standing up from a chair using your arms (e.g., wheelchair or bedside chair)?: A Little Help needed to walk in hospital room?: A Little Help needed climbing 3-5 steps with a railing? : A Lot 6 Click Score: 15    End of Session   Activity Tolerance: Patient tolerated treatment  well Patient left: in bed;with call bell/phone within reach;with bed alarm set Nurse Communication: Mobility status       Time: 1610-9604 PT Time Calculation (min) (ACUTE ONLY): 17 min  Charges:    $Therapeutic Activity: 8-22 mins PT General Charges $$ ACUTE PT VISIT: 1 Visit                     Minor Amble, LPTA/CLT; CBIS [phone]  Alesia Anchors 05/21/2024, 8:56 AM

## 2024-05-21 NOTE — Progress Notes (Signed)
   05/21/24 0700  ReDS Vest / Clip  Station Marker B  Ruler Value 47  ReDS Value Range < 36  ReDS Actual Value 30  Anatomical Comments pt upright position

## 2024-05-21 NOTE — Progress Notes (Signed)
 PROGRESS NOTE  Maureen Jackson:[id number] DOB: September 14, 1956 DOA: 05/16/2024 PCP: Veda Gerald, MD  Brief History:  68 y.o. female,  with HFrEF 2/2 ischemic cardiomyopathy, hypothyroidism, CAD s/p PCI to the LAD and RCA , PAD, HTN, T2DM, OSA not on CPAP, CKD and morbid obesity/nonischemic cardiomyopathy with a EF 35%, history of pericardial effusion status post pericardial window.  Patient presents to ED secondary to complaints of shortness of breath, she denies any chest pain, fever, chills, reports shortness of breath progressive over the last week, but significant today which prompted her to come to ED, patient report she has not been taking her Lasix .  In ED chest x-ray significant for enlarged cardiac silhouette, possible pulmonary edema, she has elevated BNP, bedside ultrasound by ED physician with finding of small pericardial effusion, she was started on IV lasix , ED discussed with cardiology at [facility], who recommended admission to [facility] and to obtain echo and to diurese.   Assessment/Plan: Acute on chronic HFrEF Ischemic and nonischemic cardiomyopathy -pt admits she is not taking lasix  at home because it causes frequent urination and she is mostly immobile.    -she was started on IV Lasix  60 mg IV twice daily but now held due to bump in creatinine, following daily weights, strict ins and out -05/17/24 2D echo: no significant pericardial effusion, LVEF remains 30-35% with global hypokinesis, grade 1 DD, aortic valve sclerosis with no aortic valve stenosis. -repeated CXR shows modest improvement in interstitial edema -holding diuretics due to bump in creatinine -hold potassium supplementation -recheck BMP in AM  -pt reports dry weight is about 340#  -encouraged sodium restriction 2 gm/day and fluid restriction -appreciate cardiology   History of pericardial effusion s/p window - History of pericardial effusion, failed pericardiocentesis; s/p window in  12/2022 - Chest x-ray with increased cardiac silhouette, but bedside echo by ED physician with no evidence of large pleural effusion, as well she is status post pericardial window which should prevent recurrence of effusion - follow up repeat 2D echo with report of small pericardial effusion present - outpatient providers felt to be secondary to hypothyroidism which remains uncontrolled as evidenced by TSH 135.5 and free T4<0.25.  - pt endorses that she was not taking synthroid  regularly   DVT right post tibial vein Right leg pain  - suspect precipitated by prolonged immobility  - pharm D dosed apixaban started 05/17/24  - appreciate cardiology team recs, for length of DVT treatment, take aspirin  81 mg and hold plavix  - resume plavix  and stop aspirin  after completing 3 months treatment for DVT - counseled patient regarding high bleeding risk    CAD - outpatient cardiology records indicate she had LHC in 2018 demonstrating severe multivessel coronary artery disease. Patient underwent PCI as opposed to CABG with DES to the LAD, x 2 to the RCA and left circumflex disease managed medically.  - continue Lipitor , Toprol , holding plavix  during DVT treatment and taking aspirin  81 mg daily - She denies any chest pain  - No chest pain.    AKI on CKD stage 3b - baseline creatiine 1.2-1.5 -holding diuretics - creatinine peaked 2.33   Morbid obesity -Body mass index is 56.25 kg/m. -pt reports she has been on Ozempic since 2023    Chronic pain  - ordered a bariatric bed - ordered PRN meds for pain   Hypertension - Continue with home regimen   Diabetes mellitus, type II -She is on  Jardiance  and Ozempic at home, resumed Jardiance , Ozempic is nonformulary, continue insulin  sliding scale  Hypothyroidism, post thyroidectomy - uncontrolled as evidenced by TSH 135  - checked free T4---<0.25  - Initially insistent that she takes levothyroxine  daily -  - later endorsed she was not very compliant -  compliance is a concern as she tells me she does not take it regularly, not on empty stomach and takes it with her other pills when she does manage to take it. I counseled her at length about why she is not feeling better and the need to take the medication correctly.  - she says she is followed by endocrinology and she is on 250 mcg of levothyroxine  daily which is the correct dose based on her weight of 256 mcg/day and she says she has been on this dose for longer than 1 year - last visit with endocrinology was in May 2023: she was counseled regarding the correct intake of her thyroid  hormone, on empty stomach at fasting, with water, separated by at least 30 minutes from breakfast and other medications,  and separated by more than 4 hours from calcium , iron , multivitamins, acid reflux medications (PPIs).    Deconditioning - consulted PT, OT - up to chair, OOB each shift - pt rrefuse SNF - she reports that she lives alone.        Family Communication:   Family at bedside  Consultants:  cardiology  Code Status:  FULL  DVT Prophylaxis: apixaban   Procedures: As Listed in Progress Note Above  Antibiotics: None       Subjective: Pt complains of leg pain--chronic.  States overall doing better.  Denies sob, n/v/d, abd pain  Objective: Vitals:   05/20/24 1317 05/20/24 1930 05/21/24 0500 05/21/24 1258  BP: 125/70 113/75 122/68 113/65  Pulse: 72 69 65 66  Resp:  20 18 17   Temp: 97.8 F (36.6 C) 97.9 F (36.6 C) 97.9 F (36.6 C) 97.8 F (36.6 C)  TempSrc: Oral Oral Oral Oral  SpO2: 93% 95% 95% 96%  Weight:   (!) 159.2 kg   Height:        Intake/Output Summary (Last 24 hours) at 05/21/2024 1846 Last data filed at 05/21/2024 1700 Gross per 24 hour  Intake 1200 ml  Output 1600 ml  Net -400 ml   Weight change: -1.6 kg Exam:  General:  Pt is alert, follows commands appropriately, not in acute distress HEENT: No icterus, No thrush, No neck mass, Bryant/AT Cardiovascular:  RRR, S1/S2, no rubs, no gallops Respiratory: bibasilar rales.  Now heeze Abdomen: Soft/+BS, non tender, non distended, no guarding Extremities: No edema, No lymphangitis, No petechiae, No rashes, no synovitis   Data Reviewed: I have personally reviewed following labs and imaging studies Basic Metabolic Panel: Recent Labs  Lab 05/18/24 0255 05/19/24 0606 05/19/24 1200 05/20/24 0428 05/21/24 0440  NA 141 141 140 141 138  K 4.5 4.7 4.5 4.8 4.5  CL 106 106 106 105 104  CO2 28 24 27 29 27   GLUCOSE 129* 136* 144* 128* 138*  BUN 26* 35* 35* 36* 35*  CREATININE 2.10* 2.32* 2.33* 1.99* 1.74*  CALCIUM  7.9* 8.1* 8.2* 8.3* 8.6*  MG  --  2.1  --   --  2.3   Liver Function Tests: Recent Labs  Lab 05/16/24 1536  AST 42*  ALT 31  ALKPHOS 248*  BILITOT 1.1  PROT 7.0  ALBUMIN  3.1*   No results for input(s): LIPASE, AMYLASE in the last 168 hours.  No results for input(s): AMMONIA in the last 168 hours. Coagulation Profile: No results for input(s): INR, PROTIME in the last 168 hours. CBC: Recent Labs  Lab 05/16/24 1536 05/17/24 0652 05/19/24 0606 05/20/24 0428 05/21/24 0440  WBC 5.6 5.7 6.8 7.0 7.2  HGB 10.9* 10.9* 10.6* 10.8* 10.5*  HCT 35.6* 35.9* 34.8* 35.7* 34.4*  MCV 87.0 88.2 91.6 90.8 90.5  PLT 208 194 217 225 224   Cardiac Enzymes: No results for input(s): CKTOTAL, CKMB, CKMBINDEX, TROPONINI in the last 168 hours. BNP: Invalid input(s): POCBNP CBG: Recent Labs  Lab 05/20/24 1550 05/20/24 2020 05/21/24 0731 05/21/24 1115 05/21/24 1622  GLUCAP 153* 157* 119* 134* 113*   HbA1C: No results for input(s): HGBA1C in the last 72 hours. Urine analysis:    Component Value Date/Time   COLORURINE YELLOW 12/31/2022 1745   APPEARANCEUR HAZY (A) 12/31/2022 1745   LABSPEC 1.013 12/31/2022 1745   PHURINE 5.0 12/31/2022 1745   GLUCOSEU >=500 (A) 12/31/2022 1745   HGBUR NEGATIVE 12/31/2022 1745   BILIRUBINUR NEGATIVE 12/31/2022 1745   KETONESUR  NEGATIVE 12/31/2022 1745   PROTEINUR NEGATIVE 12/31/2022 1745   UROBILINOGEN 0.2 10/02/2015 2320   NITRITE NEGATIVE 12/31/2022 1745   LEUKOCYTESUR NEGATIVE 12/31/2022 1745   Sepsis Labs: @LABRCNTIP (procalcitonin:4,lacticidven:4) ) Recent Results (from the past 240 hours)  Resp panel by RT-PCR (RSV, Flu A&B, Covid) Anterior Nasal Swab     Status: None   Collection Time: 05/16/24  3:10 PM   Specimen: Anterior Nasal Swab  Result Value Ref Range Status   SARS Coronavirus 2 by RT PCR NEGATIVE NEGATIVE Final    Comment: (NOTE) SARS-CoV-2 target nucleic acids are NOT DETECTED.  The SARS-CoV-2 RNA is generally detectable in upper respiratory specimens during the acute phase of infection. The lowest concentration of SARS-CoV-2 viral copies this assay can detect is 138 copies/mL. A negative result does not preclude SARS-Cov-2 infection and should not be used as the sole basis for treatment or other patient management decisions. A negative result may occur with  improper specimen collection/handling, submission of specimen other than nasopharyngeal swab, presence of viral mutation(s) within the areas targeted by this assay, and inadequate number of viral copies(<138 copies/mL). A negative result must be combined with clinical observations, patient history, and epidemiological information. The expected result is Negative.  Fact Sheet for Patients:  BloggerCourse.com  Fact Sheet for Healthcare Providers:  SeriousBroker.it  This test is no t yet approved or cleared by the [place]  FDA and  has been authorized for detection and/or diagnosis of SARS-CoV-2 by FDA under an Emergency Use Authorization (EUA). This EUA will remain  in effect (meaning this test can be used) for the duration of the COVID-19 declaration under Section 564(b)(1) of the Act, 21 U.S.C.section 360bbb-3(b)(1), unless the authorization is terminated  or revoked  sooner.       Influenza A by PCR NEGATIVE NEGATIVE Final   Influenza B by PCR NEGATIVE NEGATIVE Final    Comment: (NOTE) The Xpert Xpress SARS-CoV-2/FLU/RSV plus assay is intended as an aid in the diagnosis of influenza from Nasopharyngeal swab specimens and should not be used as a sole basis for treatment. Nasal washings and aspirates are unacceptable for Xpert Xpress SARS-CoV-2/FLU/RSV testing.  Fact Sheet for Patients: BloggerCourse.com  Fact Sheet for Healthcare Providers: SeriousBroker.it  This test is not yet approved or cleared by the [place]  FDA and has been authorized for detection and/or diagnosis of SARS-CoV-2 by FDA under an Emergency Use Authorization (EUA). This EUA will  remain in effect (meaning this test can be used) for the duration of the COVID-19 declaration under Section 564(b)(1) of the Act, 21 U.S.C. section 360bbb-3(b)(1), unless the authorization is terminated or revoked.     Resp Syncytial Virus by PCR NEGATIVE NEGATIVE Final    Comment: (NOTE) Fact Sheet for Patients: BloggerCourse.com  Fact Sheet for Healthcare Providers: SeriousBroker.it  This test is not yet approved or cleared by the [place]  FDA and has been authorized for detection and/or diagnosis of SARS-CoV-2 by FDA under an Emergency Use Authorization (EUA). This EUA will remain in effect (meaning this test can be used) for the duration of the COVID-19 declaration under Section 564(b)(1) of the Act, 21 U.S.C. section 360bbb-3(b)(1), unless the authorization is terminated or revoked.  Performed at [facility], [address] [place], [place] [address]      Scheduled Meds:  acetaminophen   1,000 mg Oral Q8H   apixaban  10 mg Oral BID   Followed by   Cecily Cohen ON 05/24/2024] apixaban  5 mg Oral BID   aspirin  EC  81 mg Oral Daily   atorvastatin   20 mg Oral Daily   brimonidine    1 drop Both Eyes BID   And   timolol   1 drop Both Eyes BID   calcium  carbonate  4 tablet Oral BID WC   empagliflozin   10 mg Oral Daily   insulin  aspart  0-15 Units Subcutaneous TID WC   levothyroxine   250 mcg Oral QAC breakfast   metoprolol  succinate  25 mg Oral Daily   sacubitril -valsartan   1 tablet Oral BID   spironolactone   12.5 mg Oral Daily   Continuous Infusions:  Procedures/Studies: DG CHEST PORT 1 VIEW Result Date: 05/19/2024 CLINICAL DATA:  Pulmonary edema EXAM: PORTABLE CHEST 1 VIEW COMPARISON:  Chest x-ray performed May 16, 2024 FINDINGS: Modest improvement in interstitial edema. Enlarged heart. Limited visualization of the retrocardiac region. IMPRESSION: 1. Modest improvement in interstitial edema. 2. Enlarged heart. Electronically Signed   By: Reagan Camera M.D.   On: 05/19/2024 08:25   ECHOCARDIOGRAM COMPLETE Result Date: 05/17/2024    ECHOCARDIOGRAM REPORT   Patient Name:   INDICA MARCOTT Date of Exam: 05/17/2024 Medical Rec #:  [id number]       Height:       65.0 in Accession #:    [id number]      Weight:       351.6 lb Date of Birth:  1956-03-17        BSA:          2.514 m Patient Age:    68 years        BP:           128/92 mmHg Patient Gender: F               HR:           79 bpm. Exam Location:  [facility] Procedure: 2D Echo, Cardiac Doppler, Color Doppler and Intracardiac            Opacification Agent (Both Spectral and Color Flow Doppler were            utilized during procedure). Indications:    Pericardial effusion I31.3  History:        Patient has prior history of Echocardiogram examinations, most                 recent 08/20/2023. CHF and Cardiomyopathy, CAD, TIA; Risk  Factors:Hypertension, Diabetes, Dyslipidemia and Sleep Apnea.                 Morbid Obesity.  Sonographer:    Denese Finn RCS Referring Phys: [phone] [place]  Sonographer Comments: Technically difficult study due to poor echo windows and suboptimal subcostal window. IMPRESSIONS  1.  Left ventricular ejection fraction, by estimation, is 30 to 35%. The left ventricle has moderately decreased function. The left ventricle demonstrates global hypokinesis. The left ventricular internal cavity size was moderately dilated. There is moderate left ventricular hypertrophy. Left ventricular diastolic parameters are consistent with Grade I diastolic dysfunction (impaired relaxation).  2. Right ventricular systolic function is normal. The right ventricular size is normal.  3. Left atrial size was mildly dilated.  4. A small pericardial effusion is present. The pericardial effusion is posterior to the left ventricle and anterior to the right ventricle.  5. The mitral valve is abnormal. No evidence of mitral valve regurgitation. No evidence of mitral stenosis. Moderate mitral annular calcification.  6. The aortic valve is tricuspid. There is mild calcification of the aortic valve. There is mild thickening of the aortic valve. Aortic valve regurgitation is not visualized. Aortic valve sclerosis is present, with no evidence of aortic valve stenosis.  7. The inferior vena cava is normal in size with greater than 50% respiratory variability, suggesting right atrial pressure of 3 mmHg. FINDINGS  Left Ventricle: Left ventricular ejection fraction, by estimation, is 30 to 35%. The left ventricle has moderately decreased function. The left ventricle demonstrates global hypokinesis. Definity  contrast agent was given IV to delineate the left ventricular endocardial borders. Strain was performed and the global longitudinal strain is indeterminate. The left ventricular internal cavity size was moderately dilated. There is moderate left ventricular hypertrophy. Left ventricular diastolic parameters are consistent with Grade I diastolic dysfunction (impaired relaxation). Right Ventricle: The right ventricular size is normal. No increase in right ventricular wall thickness. Right ventricular systolic function is normal. Left  Atrium: Left atrial size was mildly dilated. Right Atrium: Right atrial size was normal in size. Pericardium: A small pericardial effusion is present. The pericardial effusion is posterior to the left ventricle and anterior to the right ventricle. Mitral Valve: The mitral valve is abnormal. There is mild thickening of the mitral valve leaflet(s). There is mild calcification of the mitral valve leaflet(s). Moderate mitral annular calcification. No evidence of mitral valve regurgitation. No evidence  of mitral valve stenosis. Tricuspid Valve: The tricuspid valve is normal in structure. Tricuspid valve regurgitation is not demonstrated. No evidence of tricuspid stenosis. Aortic Valve: The aortic valve is tricuspid. There is mild calcification of the aortic valve. There is mild thickening of the aortic valve. Aortic valve regurgitation is not visualized. Aortic valve sclerosis is present, with no evidence of aortic valve stenosis. Pulmonic Valve: The pulmonic valve was normal in structure. Pulmonic valve regurgitation is trivial. No evidence of pulmonic stenosis. Aorta: The aortic root is normal in size and structure. Venous: The inferior vena cava is normal in size with greater than 50% respiratory variability, suggesting right atrial pressure of 3 mmHg. IAS/Shunts: No atrial level shunt detected by color flow Doppler. Additional Comments: 3D was performed not requiring image post processing on an independent workstation and was indeterminate.  LEFT VENTRICLE PLAX 2D LVIDd:         6.00 cm   Diastology LVIDs:         4.60 cm   LV e' medial:   3.70 cm/s LV PW:  1.50 cm   LV E/e' medial: 13.0 LV IVS:        1.40 cm LVOT diam:     1.80 cm LV SV:         25 LV SV Index:   10 LVOT Area:     2.54 cm  RIGHT VENTRICLE RV S prime:     5.96 cm/s LEFT ATRIUM             Index LA diam:        4.10 cm 1.63 cm/m LA Vol (A2C):   80.4 ml 31.98 ml/m LA Vol (A4C):   76.1 ml 30.27 ml/m LA Biplane Vol: 78.8 ml 31.34 ml/m   AORTIC VALVE LVOT Vmax:   65.57 cm/s LVOT Vmean:  45.700 cm/s LVOT VTI:    0.097 m  AORTA Ao Root diam: 3.50 cm MITRAL VALVE MV Area (PHT): 2.81 cm    SHUNTS MV Decel Time: 270 msec    Systemic VTI:  0.10 m MV E velocity: 48.05 cm/s  Systemic Diam: 1.80 cm MV A velocity: 68.55 cm/s MV E/A ratio:  0.70 Janelle Mediate MD Electronically signed by Janelle Mediate MD Signature Date/Time: 05/17/2024/3:24:41 PM    Final    US  Venous Img Lower Bilateral (DVT) Result Date: 05/17/2024 CLINICAL DATA:  Lower extremity pain. EXAM: BILATERAL LOWER EXTREMITY VENOUS DOPPLER ULTRASOUND TECHNIQUE: Gray-scale sonography with graded compression, as well as color Doppler and duplex ultrasound were performed to evaluate the lower extremity deep venous systems from the level of the common femoral vein and including the common femoral, femoral, profunda femoral, popliteal and calf veins including the posterior tibial, peroneal and gastrocnemius veins when visible. The superficial great saphenous vein was also interrogated. Spectral Doppler was utilized to evaluate flow at rest and with distal augmentation maneuvers in the common femoral, femoral and popliteal veins. COMPARISON:  None Available. FINDINGS: RIGHT LOWER EXTREMITY Common Femoral Vein: No evidence of thrombus. Normal compressibility, respiratory phasicity and response to augmentation. Saphenofemoral Junction: No evidence of thrombus. Normal compressibility and flow on color Doppler imaging. Profunda Femoral Vein: No evidence of thrombus. Normal compressibility and flow on color Doppler imaging. Femoral Vein: No evidence of thrombus. Normal compressibility, respiratory phasicity and response to augmentation. Popliteal Vein: No evidence of thrombus. Normal compressibility, respiratory phasicity and response to augmentation. Calf Veins: Absent color Doppler flow within the noncompressible right posterior tibial vein noted. Superficial Great Saphenous Vein: No evidence of thrombus.  Normal compressibility. Venous Reflux:  None. Other Findings:  None. LEFT LOWER EXTREMITY Common Femoral Vein: No evidence of thrombus. Normal compressibility, respiratory phasicity and response to augmentation. Saphenofemoral Junction: No evidence of thrombus. Normal compressibility and flow on color Doppler imaging. Profunda Femoral Vein: No evidence of thrombus. Normal compressibility and flow on color Doppler imaging. Femoral Vein: No evidence of thrombus. Normal compressibility, respiratory phasicity and response to augmentation. Popliteal Vein: No evidence of thrombus. Normal compressibility, respiratory phasicity and response to augmentation. Calf Veins: No evidence of thrombus. Normal compressibility and flow on color Doppler imaging. Superficial Great Saphenous Vein: No evidence of thrombus. Normal compressibility. Venous Reflux:  None. Other Findings:  None. IMPRESSION: 1. Positive for deep vein thrombosis involving the right posterior tibial vein. 2. No evidence for deep vein thrombosis within the left lower extremity. These results will be called to the ordering clinician or representative by the Radiologist Assistant, and communication documented in the PACS or [employer]. Electronically Signed   By: Kimberley Penman M.D.   On: 05/17/2024 11:15  DG Chest Port 1 View Result Date: 05/16/2024 CLINICAL DATA:  sob EXAM: PORTABLE CHEST - 1 VIEW COMPARISON:  October 08, 2023 FINDINGS: Diffuse bilateral perihilar interstitial opacities. Worsening enlargement of the cardiac silhouette. No acute fracture or destructive lesions. Multilevel thoracic osteophytosis. IMPRESSION: 1. Diffuse bilateral perihilar interstitial opacities, likely reflecting interstitial edema. 2. Worsening enlargement of the cardiac silhouette, possibly from progressive cardiomegaly or superimposed pericardial effusion. Electronically Signed   By: Rance Burrows M.D.   On: 05/16/2024 15:52    Demaris Fillers, DO  [facility]  If 7PM-7AM, please contact night-coverage [email or web address] Password [facility] 05/21/2024, 6:46 PM   LOS: 1 day

## 2024-05-21 NOTE — Progress Notes (Signed)
 Progress Note  Patient Name: Maureen Jackson Date of Encounter: 05/21/2024  Primary Cardiologist: Armida Lander, MD  Interval Summary  Chart reviewed.  Net urine output of approximately 700 cc last 24 hours, diuretics have still been on hold.  Creatinine continues to improve down to 1.74.  She has been working with PT.  Was up in chair some yesterday.  Vital Signs  Vitals:   05/20/24 0523 05/20/24 1317 05/20/24 1930 05/21/24 0500  BP: 115/66 125/70 113/75 122/68  Pulse: 61 72 69 65  Resp: 20  20 18   Temp: 97.8 F (36.6 C) 97.8 F (36.6 C) 97.9 F (36.6 C) 97.9 F (36.6 C)  TempSrc: Oral Oral Oral Oral  SpO2: 93% 93% 95% 95%  Weight: (!) 160.8 kg   (!) 159.2 kg  Height:        Intake/Output Summary (Last 24 hours) at 05/21/2024 1022 Last data filed at 05/21/2024 0817 Gross per 24 hour  Intake 720 ml  Output 900 ml  Net -180 ml   Filed Weights   05/19/24 0445 05/20/24 0523 05/21/24 0500  Weight: (!) 160.1 kg (!) 160.8 kg (!) 159.2 kg    Physical Exam  GEN: No acute distress.   Neck: Difficult to assess JVP. Cardiac: Indistinct PMI, RRR without gallop.  Respiratory: Nonlabored. Clear to auscultation bilaterally. GI: Soft, nontender, bowel sounds present. MS: Stable lower leg edema.  ECG/Telemetry  Telemetry reviewed showing sinus rhythm.  Labs  Chemistry Recent Labs  Lab 05/16/24 1536 05/17/24 0652 05/19/24 1200 05/20/24 0428 05/21/24 0440  NA 142   < > 140 141 138  K 3.8   < > 4.5 4.8 4.5  CL 108   < > 106 105 104  CO2 26   < > 27 29 27   GLUCOSE 132*   < > 144* 128* 138*  BUN 19   < > 35* 36* 35*  CREATININE 1.27*   < > 2.33* 1.99* 1.74*  CALCIUM  8.1*   < > 8.2* 8.3* 8.6*  PROT 7.0  --   --   --   --   ALBUMIN  3.1*  --   --   --   --   AST 42*  --   --   --   --   ALT 31  --   --   --   --   ALKPHOS 248*  --   --   --   --   BILITOT 1.1  --   --   --   --   GFRNONAA 47*   < > 23* 27* 32*  ANIONGAP 8   < > 7 7 7    < > = values in this  interval not displayed.    Hematology Recent Labs  Lab 05/19/24 0606 05/20/24 0428 05/21/24 0440  WBC 6.8 7.0 7.2  RBC 3.80* 3.93 3.80*  HGB 10.6* 10.8* 10.5*  HCT 34.8* 35.7* 34.4*  MCV 91.6 90.8 90.5  MCH 27.9 27.5 27.6  MCHC 30.5 30.3 30.5  RDW 15.2 14.9 15.0  PLT 217 225 224   Cardiac Enzymes Recent Labs  Lab 05/16/24 1536 05/16/24 1713  TROPONINIHS 25* 29*   Cardiac Studies  Echocardiogram 05/17/2024:  1. Left ventricular ejection fraction, by estimation, is 30 to 35%. The  left ventricle has moderately decreased function. The left ventricle  demonstrates global hypokinesis. The left ventricular internal cavity size  was moderately dilated. There is  moderate left ventricular hypertrophy. Left ventricular diastolic  parameters are  consistent with Grade I diastolic dysfunction (impaired  relaxation).   2. Right ventricular systolic function is normal. The right ventricular  size is normal.   3. Left atrial size was mildly dilated.   4. A small pericardial effusion is present. The pericardial effusion is  posterior to the left ventricle and anterior to the right ventricle.   5. The mitral valve is abnormal. No evidence of mitral valve  regurgitation. No evidence of mitral stenosis. Moderate mitral annular  calcification.   6. The aortic valve is tricuspid. There is mild calcification of the  aortic valve. There is mild thickening of the aortic valve. Aortic valve  regurgitation is not visualized. Aortic valve sclerosis is present, with  no evidence of aortic valve stenosis.   7. The inferior vena cava is normal in size with greater than 50%  respiratory variability, suggesting right atrial pressure of 3 mmHg.   Assessment & Plan  1.  HFrEF, LVEF 30 to 35% with global hypokinesis and normal RV contraction by recent follow-up echocardiogram.  Has had element of fluid overload in the setting of medication noncompliance including diuretics and also severe  hypothyroidism.  2.  CAD status post DES to the LAD and RCA in 2018 with medically managed circumflex disease.  No active angina.  Cardiac enzymes are not consistent with ACS.  3.  Acute on chronic renal failure, creatinine up to 2.33 following resumption of outpatient regimen and IV diuretics.  Baseline CKD stage IIIb.  Creatinine has come down to 1.74.  4.  Acute right-sided DVT involving posterior tibial vein.  Started on Eliquis by primary team.  She has been taken off Plavix  (for CAD) and started on aspirin  81 mg daily.  5.  Hypothyroidism status post thyroidectomy.  Current TSH 135 consistent with inadequate supplementation.  Now on Synthroid  250 mcg daily per primary team.  Continue on DVT treatment dose Eliquis, aspirin  81 mg daily, Lipitor  20 mg daily, Toprol -XL 25 mg daily, Entresto  49/51 mg daily, and Jardiance  10 mg daily.  Will also go ahead and add back Aldactone  12.5 mg daily.  Continue to hold Lasix  as she is auto diuresing somewhat and her renal function is improving.  Suspect hypothyroidism still complicating the picture, she continues on high dose Synthroid .  Being set up for community paramedicine program.  Continue to work with PT.  For questions or updates, please contact [facility] Please consult [email or web address] for contact info under   Signed, Teddie Favre, MD  05/21/2024, 10:22 AM

## 2024-05-22 DIAGNOSIS — N178 Other acute kidney failure: Secondary | ICD-10-CM | POA: Diagnosis not present

## 2024-05-22 DIAGNOSIS — I3139 Other pericardial effusion (noninflammatory): Secondary | ICD-10-CM | POA: Diagnosis not present

## 2024-05-22 DIAGNOSIS — I5023 Acute on chronic systolic (congestive) heart failure: Secondary | ICD-10-CM | POA: Diagnosis not present

## 2024-05-22 DIAGNOSIS — I82401 Acute embolism and thrombosis of unspecified deep veins of right lower extremity: Secondary | ICD-10-CM

## 2024-05-22 DIAGNOSIS — N189 Chronic kidney disease, unspecified: Secondary | ICD-10-CM | POA: Diagnosis not present

## 2024-05-22 DIAGNOSIS — I2510 Atherosclerotic heart disease of native coronary artery without angina pectoris: Secondary | ICD-10-CM | POA: Diagnosis not present

## 2024-05-22 DIAGNOSIS — N179 Acute kidney failure, unspecified: Secondary | ICD-10-CM | POA: Diagnosis not present

## 2024-05-22 LAB — GLUCOSE, CAPILLARY
Glucose-Capillary: 146 mg/dL — ABNORMAL HIGH (ref 70–99)
Glucose-Capillary: 142 mg/dL — ABNORMAL HIGH (ref 70–99)
Glucose-Capillary: 115 mg/dL — ABNORMAL HIGH (ref 70–99)
Glucose-Capillary: 96 mg/dL (ref 70–99)

## 2024-05-22 LAB — CBC
HCT: 34 % — ABNORMAL LOW (ref 36.0–46.0)
Hemoglobin: 10.3 g/dL — ABNORMAL LOW (ref 12.0–15.0)
MCH: 27.1 pg (ref 26.0–34.0)
MCHC: 30.3 g/dL (ref 30.0–36.0)
MCV: 89.5 fL (ref 80.0–100.0)
Platelets: 197 10*3/uL (ref 150–400)
RBC: 3.8 MIL/uL — ABNORMAL LOW (ref 3.87–5.11)
RDW: 15 % (ref 11.5–15.5)
WBC: 6.8 10*3/uL (ref 4.0–10.5)
nRBC: 0 % (ref 0.0–0.2)

## 2024-05-22 LAB — BASIC METABOLIC PANEL WITH GFR
Anion gap: 7 (ref 5–15)
BUN: 37 mg/dL — ABNORMAL HIGH (ref 8–23)
CO2: 27 mmol/L (ref 22–32)
Calcium: 8.8 mg/dL — ABNORMAL LOW (ref 8.9–10.3)
Chloride: 106 mmol/L (ref 98–111)
Creatinine, Ser: 1.68 mg/dL — ABNORMAL HIGH (ref 0.44–1.00)
GFR, Estimated: 33 mL/min — ABNORMAL LOW (ref >60)
Glucose, Bld: 152 mg/dL — ABNORMAL HIGH (ref 70–99)
Potassium: 4.6 mmol/L (ref 3.5–5.1)
Sodium: 140 mmol/L (ref 135–145)

## 2024-05-22 MED ORDER — FUROSEMIDE 10 MG/ML IJ SOLN
60.0000 mg | Freq: Once | INTRAMUSCULAR | Status: AC
  Administered 2024-05-22: 60 mg via INTRAVENOUS
  Filled 2024-05-22: qty 6

## 2024-05-22 NOTE — Progress Notes (Signed)
   05/22/24 0700  ReDS Vest / Clip  Station Marker B  Ruler Value 47  Anatomical Comments low volume

## 2024-05-22 NOTE — Plan of Care (Signed)

## 2024-05-22 NOTE — TOC Progression Note (Signed)
 Transition of Care [facility]) - Progression Note    Patient Details  Name: Maureen Jackson MRN: [id number] Date of Birth: 05/05/1956  [facility]) CM/SW Contact  Cyndie Dredge, [place] Phone Number: 05/22/2024, 2:04 PM  Clinical Narrative:     Writer faxed patient form off this morning for the [facility]. TOC to follow.    Expected Discharge Plan: Home w Home Health Services Barriers to Discharge: Continued Medical Work up  Expected Discharge Plan and Services In-house Referral: Clinical Social Work     Living arrangements for the past 2 months: Single Family Home                 DME Arranged: Otho Blitz wide DME Agency: [employer] Date DME Agency Contacted: 05/19/24 Time DME Agency Contacted: 1030 Representative spoke with at DME Agency: Gladys Lamp HH Arranged: PT HH Agency: [facility] Date [facility] Agency Contacted: 05/19/24 Time HH Agency Contacted: 1030 Representative spoke with at [facility] Agency: Randel Buss   Social Determinants of Health (SDOH) Interventions SDOH Screenings   Food Insecurity: No Food Insecurity (05/19/2024)  Housing: Low Risk  (05/19/2024)  Transportation Needs: Unmet Transportation Needs (05/19/2024)  Utilities: Not At Risk (05/16/2024)  Alcohol Screen: Low Risk  (12/22/2022)  Depression (PHQ2-9): Low Risk  (01/01/2020)  Financial Resource Strain: Low Risk  (05/19/2024)  Social Connections: Unknown (05/16/2024)  Tobacco Use: Medium Risk (05/19/2024)    Readmission Risk Interventions    05/22/2024    2:03 PM 05/22/2024   12:06 PM 05/21/2024    8:50 AM  Readmission Risk Prevention Plan  Transportation Screening Complete Complete Complete  Home Care Screening Complete Complete Complete  Medication Review (RN CM) Complete Complete Complete

## 2024-05-22 NOTE — Progress Notes (Signed)
 Physical Therapy Treatment Patient Details Name: LIZMARIE WITTERS MRN: [id number] DOB: 05-Oct-1956 Today's Date: 05/22/2024   History of Present Illness Bevin Das  is a 68 y.o. 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  - In ED chest x-ray significant for enlarged cardiac silhouette, possible pulmonary edema, she has elevated BNP, bedside ultrasound by ED physician with finding of small pericardial effusion, she was started on IV lasix , ED discussed with cardiology at [facility], who recommended admission to [facility] and to obtain echo and to diurese.    PT Comments  Pt supine and willing to participate with therapy today.  Pt limited by Lt LE pain that was monitored through session and c/o itching.  Lotion applied to back to assist with itching though continued following, pt fearful of allergy to medication, call bell pressed for RN assistance.  Pt presents with slow labored movements with bed mobility and transfer training. Cueing to assist with reaching for handrails and handplacement prior standing and sitting for safety.  Seated LE strengthening exercises complete with good form and control though limited by fatigue.  EOS pt left in chair with call bell within reach and chair alarm set.        If plan is discharge home, recommend the following:     Can travel by private vehicle        Equipment Recommendations       Recommendations for Other Services       Precautions / Restrictions  Precautions Precautions: Fall Recall of Precautions/Restrictions: Intact Restrictions Weight Bearing Restrictions Per Provider Order: No     Mobility  Bed Mobility Overal bed mobility: Modified Independent Bed Mobility: Supine to Sit   Sidelying to sit: Used rails, Mod assist       General bed mobility comments: Increased time, elevated bed height, cueing and need for rails to assist with rolling trunk for sitting on EOB    Transfers Overall transfer level: Needs assistance Equipment used: Rolling walker (2 wheels) Transfers: Sit to/from Stand Sit to Stand: Contact guard assist, Min assist           General transfer comment: Cueing for handplacement for increased ease with STS and safety    Ambulation/Gait Ambulation/Gait assistance: Contact guard assist Gait Distance (Feet): 5 Feet Assistive device: Rolling walker (2 wheels) Gait Pattern/deviations: Decreased step length - right, Decreased step length - left, Decreased stride length Gait velocity: slow     General Gait Details: Limited by fatigue/general weakness   Stairs             Wheelchair Mobility     Tilt Bed    Modified Rankin (Stroke Patients Only)       Balance                                            Communication    Cognition Arousal: Alert Behavior During Therapy: [facility]  for tasks assessed/performed   PT - Cognitive impairments: No apparent impairments                                Cueing    Exercises General Exercises - Lower Extremity Ankle Circles/Pumps: AROM, Strengthening, Both, 10 reps, Seated Long Arc Quad: AROM, Strengthening, Both, 10 reps, Seated Hip ABduction/ADduction: AROM, Both, 10 reps, Seated Hip Flexion/Marching: AROM, Both, 5 reps, Seated    General Comments        Pertinent Vitals/Pain Pain Assessment Pain Assessment: 0-10 Pain Score: 5  Pain Location: Lt LE pain. c/o itching through session Pain Descriptors /  Indicators: Discomfort, Aching Pain Intervention(s): Monitored during session, Limited activity within patient's tolerance, Repositioned, Premedicated before session    Home Living                          Prior Function            PT Goals (current goals can now be found in the care plan section)      Frequency           PT Plan      Co-evaluation              AM-PAC PT 6 Clicks Mobility   Outcome Measure  Help needed turning from your back to your side while in a flat bed without using bedrails?: A Lot Help needed moving from lying on your back to sitting on the side of a flat bed without using bedrails?: A Lot Help needed moving to and from a bed to a chair (including a wheelchair)?: A Little Help needed standing up from a chair using your arms (e.g., wheelchair or bedside chair)?: A Little Help needed to walk in hospital room?: A Little Help needed climbing 3-5 steps with a railing? : A Lot 6 Click Score: 15    End of Session Equipment Utilized During Treatment: Gait belt Activity Tolerance: Patient tolerated treatment well Patient left: in chair;with call bell/phone within reach;with chair alarm set Nurse Communication: Mobility status       Time: 0840-0900 PT Time Calculation (min) (ACUTE ONLY): 20 min  Charges:    $Therapeutic Activity: 8-22 mins PT General Charges $$ ACUTE PT VISIT: 1 Visit                     Minor Amble, LPTA/CLT; CBIS [phone]  Alesia Anchors 05/22/2024, 9:14 AM

## 2024-05-22 NOTE — Progress Notes (Signed)
 PROGRESS NOTE  Maureen Jackson LKG:[id number] DOB: 05-29-1956 DOA: 05/16/2024 PCP: Veda Gerald, MD  Brief History:  68 y.o. female,  with HFrEF 2/2 ischemic cardiomyopathy, hypothyroidism, CAD s/p PCI to the LAD and RCA , PAD, HTN, T2DM, OSA not on CPAP, CKD and morbid obesity/nonischemic cardiomyopathy with a EF 35%, history of pericardial effusion status post pericardial window.  Patient presents to ED secondary to complaints of shortness of breath, she denies any chest pain, fever, chills, reports shortness of breath progressive over the last week, but significant today which prompted her to come to ED, patient report she has not been taking her Lasix .  In ED chest x-ray significant for enlarged cardiac silhouette, possible pulmonary edema, she has elevated BNP, bedside ultrasound by ED physician with finding of small pericardial effusion, she was started on IV lasix , ED discussed with cardiology at [facility], who recommended admission to [facility] and to obtain echo and to diurese.   Assessment/Plan:  Acute on chronic HFrEF -pt admits she is not taking lasix  at home because it causes frequent urination and she is mostly immobile.    -she was started on IV Lasix  60 mg IV twice daily but now held due to bump in creatinine, following daily weights, strict ins and out -05/22/24--redose lasix  -05/17/24 2D echo: no significant pericardial effusion, LVEF remains 30-35% with global hypokinesis, grade 1 DD, aortic valve sclerosis with no aortic valve stenosis. -repeated CXR shows modest improvement in interstitial edema -holding diuretics due to bump in creatinine -hold potassium supplementation -recheck BMP in AM  -pt reports dry weight is about 340#  -encouraged sodium restriction 2 gm/day and fluid restriction -appreciate cardiology   History of pericardial effusion s/p window - History of pericardial effusion, failed pericardiocentesis; s/p window in 12/2022 - Chest x-ray  with increased cardiac silhouette, but bedside echo by ED physician with no evidence of large pleural effusion, as well she is status post pericardial window which should prevent recurrence of effusion - follow up repeat 2D echo with report of small pericardial effusion present - outpatient providers felt to be secondary to hypothyroidism which remains uncontrolled as evidenced by TSH 135.5 and free T4<0.25.  - pt endorses that she was not taking synthroid  regularly   DVT right post tibial vein Right leg pain  - suspect precipitated by prolonged immobility  - pharm D dosed apixaban started 05/17/24  - appreciate cardiology team recs, for length of DVT treatment, take aspirin  81 mg and hold plavix  - resume plavix  and stop aspirin  after completing 3 months treatment for DVT - counseled patient regarding high bleeding risk    CAD - outpatient cardiology records indicate she had LHC in 2018 demonstrating severe multivessel coronary artery disease. Patient underwent PCI as opposed to CABG with DES to the LAD, x 2 to the RCA and left circumflex disease managed medically.  - continue Lipitor , Toprol , holding plavix  during DVT treatment and taking aspirin  81 mg daily - She denies any chest pain     AKI on CKD stage 3b - baseline creatiine 1.2-1.5 -holding diuretics - creatinine peaked 2.33   Morbid obesity -Body mass index is 56.25 kg/m. -pt reports she has been on Ozempic since 2023    Chronic pain  - ordered a bariatric bed - ordered PRN meds for pain   Hypertension - Continue with metoprolol  succinate, Spiro, Entresto    Diabetes mellitus, type II -She is on Jardiance  and Ozempic  at home, resumed Jardiance , Ozempic is nonformulary, continue insulin  sliding scale   Hypothyroidism, post thyroidectomy - uncontrolled as evidenced by TSH 135  - checked free T4---<0.25  - Initially insistent that she takes levothyroxine  daily -  - later endorsed she was not very compliant - compliance is  a concern as she tells me she does not take it regularly, not on empty stomach and takes it with her other pills when she does manage to take it. I counseled her at length about why she is not feeling better and the need to take the medication correctly.  - she says she is followed by endocrinology and she is on 250 mcg of levothyroxine  daily which is the correct dose based on her weight of 256 mcg/day and she says she has been on this dose for longer than 1 year - last visit with endocrinology was in May 2023: she was counseled regarding the correct intake of her thyroid  hormone, on empty stomach at fasting, with water, separated by at least 30 minutes from breakfast and other medications,  and separated by more than 4 hours from calcium , iron , multivitamins, acid reflux medications (PPIs).    Deconditioning - consulted PT, OT - up to chair, OOB each shift - pt rrefuse SNF - she reports that she lives alone.              Family Communication: no  Family at bedside   Consultants:  cardiology   Code Status:  FULL   DVT Prophylaxis: apixaban     Procedures: As Listed in Progress Note Above   Antibiotics: None      Subjective:   Objective: Vitals:   05/22/24 0421 05/22/24 0500 05/22/24 0703 05/22/24 1315  BP: 106/66  111/68 95/62  Pulse: 65  65 66  Resp: 20  (!) 22   Temp: 98.2 F (36.8 C)  98 F (36.7 C) (!) 97.5 F (36.4 C)  TempSrc: Oral  Oral Oral  SpO2: 92%   94%  Weight:  (!) 163.3 kg    Height:        Intake/Output Summary (Last 24 hours) at 05/22/2024 1345 Last data filed at 05/22/2024 1601 Gross per 24 hour  Intake 720 ml  Output 1350 ml  Net -630 ml   Weight change: 4.1 kg Exam:  General:  Pt is alert, follows commands appropriately, not in acute distress HEENT: No icterus, No thrush, No neck mass, [place]/AT Cardiovascular: RRR, S1/S2, no rubs, no gallops Respiratory:fine bibasilar rales. No wheeze Abdomen: Soft/+BS, non tender, non distended, no  guarding Extremities: trace LE edema, No lymphangitis, No petechiae, No rashes, no synovitis   Data Reviewed: I have personally reviewed following labs and imaging studies Basic Metabolic Panel: Recent Labs  Lab 05/19/24 0606 05/19/24 1200 05/20/24 0428 05/21/24 0440 05/22/24 0447  NA 141 140 141 138 140  K 4.7 4.5 4.8 4.5 4.6  CL 106 106 105 104 106  CO2 24 27 29 27 27   GLUCOSE 136* 144* 128* 138* 152*  BUN 35* 35* 36* 35* 37*  CREATININE 2.32* 2.33* 1.99* 1.74* 1.68*  CALCIUM  8.1* 8.2* 8.3* 8.6* 8.8*  MG 2.1  --   --  2.3  --    Liver Function Tests: Recent Labs  Lab 05/16/24 1536  AST 42*  ALT 31  ALKPHOS 248*  BILITOT 1.1  PROT 7.0  ALBUMIN  3.1*   No results for input(s): LIPASE, AMYLASE in the last 168 hours. No results for input(s): AMMONIA in the  last 168 hours. Coagulation Profile: No results for input(s): INR, PROTIME in the last 168 hours. CBC: Recent Labs  Lab 05/17/24 0652 05/19/24 0606 05/20/24 0428 05/21/24 0440 05/22/24 0447  WBC 5.7 6.8 7.0 7.2 6.8  HGB 10.9* 10.6* 10.8* 10.5* 10.3*  HCT 35.9* 34.8* 35.7* 34.4* 34.0*  MCV 88.2 91.6 90.8 90.5 89.5  PLT 194 217 225 224 197   Cardiac Enzymes: No results for input(s): CKTOTAL, CKMB, CKMBINDEX, TROPONINI in the last 168 hours. BNP: Invalid input(s): POCBNP CBG: Recent Labs  Lab 05/21/24 1115 05/21/24 1622 05/21/24 2037 05/22/24 0710 05/22/24 1111  GLUCAP 134* 113* 191* 146* 142*   HbA1C: No results for input(s): HGBA1C in the last 72 hours. Urine analysis:    Component Value Date/Time   COLORURINE YELLOW 12/31/2022 1745   APPEARANCEUR HAZY (A) 12/31/2022 1745   LABSPEC 1.013 12/31/2022 1745   PHURINE 5.0 12/31/2022 1745   GLUCOSEU >=500 (A) 12/31/2022 1745   HGBUR NEGATIVE 12/31/2022 1745   BILIRUBINUR NEGATIVE 12/31/2022 1745   KETONESUR NEGATIVE 12/31/2022 1745   PROTEINUR NEGATIVE 12/31/2022 1745   UROBILINOGEN 0.2 10/02/2015 2320   NITRITE NEGATIVE  12/31/2022 1745   LEUKOCYTESUR NEGATIVE 12/31/2022 1745   Sepsis Labs: @LABRCNTIP (procalcitonin:4,lacticidven:4) ) Recent Results (from the past 240 hours)  Resp panel by RT-PCR (RSV, Flu A&B, Covid) Anterior Nasal Swab     Status: None   Collection Time: 05/16/24  3:10 PM   Specimen: Anterior Nasal Swab  Result Value Ref Range Status   SARS Coronavirus 2 by RT PCR NEGATIVE NEGATIVE Final    Comment: (NOTE) SARS-CoV-2 target nucleic acids are NOT DETECTED.  The SARS-CoV-2 RNA is generally detectable in upper respiratory specimens during the acute phase of infection. The lowest concentration of SARS-CoV-2 viral copies this assay can detect is 138 copies/mL. A negative result does not preclude SARS-Cov-2 infection and should not be used as the sole basis for treatment or other patient management decisions. A negative result may occur with  improper specimen collection/handling, submission of specimen other than nasopharyngeal swab, presence of viral mutation(s) within the areas targeted by this assay, and inadequate number of viral copies(<138 copies/mL). A negative result must be combined with clinical observations, patient history, and epidemiological information. The expected result is Negative.  Fact Sheet for Patients:  BloggerCourse.com  Fact Sheet for Healthcare Providers:  SeriousBroker.it  This test is no t yet approved or cleared by the [place]  FDA and  has been authorized for detection and/or diagnosis of SARS-CoV-2 by FDA under an Emergency Use Authorization (EUA). This EUA will remain  in effect (meaning this test can be used) for the duration of the COVID-19 declaration under Section 564(b)(1) of the Act, 21 U.S.C.section 360bbb-3(b)(1), unless the authorization is terminated  or revoked sooner.       Influenza A by PCR NEGATIVE NEGATIVE Final   Influenza B by PCR NEGATIVE NEGATIVE Final    Comment:  (NOTE) The Xpert Xpress SARS-CoV-2/FLU/RSV plus assay is intended as an aid in the diagnosis of influenza from Nasopharyngeal swab specimens and should not be used as a sole basis for treatment. Nasal washings and aspirates are unacceptable for Xpert Xpress SARS-CoV-2/FLU/RSV testing.  Fact Sheet for Patients: BloggerCourse.com  Fact Sheet for Healthcare Providers: SeriousBroker.it  This test is not yet approved or cleared by the [place]  FDA and has been authorized for detection and/or diagnosis of SARS-CoV-2 by FDA under an Emergency Use Authorization (EUA). This EUA will remain in effect (meaning this test can  be used) for the duration of the COVID-19 declaration under Section 564(b)(1) of the Act, 21 U.S.C. section 360bbb-3(b)(1), unless the authorization is terminated or revoked.     Resp Syncytial Virus by PCR NEGATIVE NEGATIVE Final    Comment: (NOTE) Fact Sheet for Patients: BloggerCourse.com  Fact Sheet for Healthcare Providers: SeriousBroker.it  This test is not yet approved or cleared by the [place]  FDA and has been authorized for detection and/or diagnosis of SARS-CoV-2 by FDA under an Emergency Use Authorization (EUA). This EUA will remain in effect (meaning this test can be used) for the duration of the COVID-19 declaration under Section 564(b)(1) of the Act, 21 U.S.C. section 360bbb-3(b)(1), unless the authorization is terminated or revoked.  Performed at [facility], [address] [place], [place] [address]      Scheduled Meds:  acetaminophen   1,000 mg Oral Q8H   apixaban  10 mg Oral BID   Followed by   Cecily Cohen ON 05/24/2024] apixaban  5 mg Oral BID   aspirin  EC  81 mg Oral Daily   atorvastatin   20 mg Oral Daily   brimonidine   1 drop Both Eyes BID   And   timolol   1 drop Both Eyes BID   calcium  carbonate  4 tablet Oral BID WC   empagliflozin    10 mg Oral Daily   insulin  aspart  0-15 Units Subcutaneous TID WC   levothyroxine   250 mcg Oral QAC breakfast   metoprolol  succinate  25 mg Oral Daily   sacubitril -valsartan   1 tablet Oral BID   spironolactone   12.5 mg Oral Daily   Continuous Infusions:  Procedures/Studies: DG CHEST PORT 1 VIEW Result Date: 05/19/2024 CLINICAL DATA:  Pulmonary edema EXAM: PORTABLE CHEST 1 VIEW COMPARISON:  Chest x-ray performed May 16, 2024 FINDINGS: Modest improvement in interstitial edema. Enlarged heart. Limited visualization of the retrocardiac region. IMPRESSION: 1. Modest improvement in interstitial edema. 2. Enlarged heart. Electronically Signed   By: Reagan Camera M.D.   On: 05/19/2024 08:25   ECHOCARDIOGRAM COMPLETE Result Date: 05/17/2024    ECHOCARDIOGRAM REPORT   Patient Name:   Maureen Jackson Date of Exam: 05/17/2024 Medical Rec #:  [id number]       Height:       65.0 in Accession #:    [id number]      Weight:       351.6 lb Date of Birth:  10-09-1956        BSA:          2.514 m Patient Age:    68 years        BP:           128/92 mmHg Patient Gender: F               HR:           79 bpm. Exam Location:  [facility] Procedure: 2D Echo, Cardiac Doppler, Color Doppler and Intracardiac            Opacification Agent (Both Spectral and Color Flow Doppler were            utilized during procedure). Indications:    Pericardial effusion I31.3  History:        Patient has prior history of Echocardiogram examinations, most                 recent 08/20/2023. CHF and Cardiomyopathy, CAD, TIA; Risk  Factors:Hypertension, Diabetes, Dyslipidemia and Sleep Apnea.                 Morbid Obesity.  Sonographer:    Denese Finn RCS Referring Phys: [phone] [place]  Sonographer Comments: Technically difficult study due to poor echo windows and suboptimal subcostal window. IMPRESSIONS  1. Left ventricular ejection fraction, by estimation, is 30 to 35%. The left ventricle has moderately decreased function. The  left ventricle demonstrates global hypokinesis. The left ventricular internal cavity size was moderately dilated. There is moderate left ventricular hypertrophy. Left ventricular diastolic parameters are consistent with Grade I diastolic dysfunction (impaired relaxation).  2. Right ventricular systolic function is normal. The right ventricular size is normal.  3. Left atrial size was mildly dilated.  4. A small pericardial effusion is present. The pericardial effusion is posterior to the left ventricle and anterior to the right ventricle.  5. The mitral valve is abnormal. No evidence of mitral valve regurgitation. No evidence of mitral stenosis. Moderate mitral annular calcification.  6. The aortic valve is tricuspid. There is mild calcification of the aortic valve. There is mild thickening of the aortic valve. Aortic valve regurgitation is not visualized. Aortic valve sclerosis is present, with no evidence of aortic valve stenosis.  7. The inferior vena cava is normal in size with greater than 50% respiratory variability, suggesting right atrial pressure of 3 mmHg. FINDINGS  Left Ventricle: Left ventricular ejection fraction, by estimation, is 30 to 35%. The left ventricle has moderately decreased function. The left ventricle demonstrates global hypokinesis. Definity  contrast agent was given IV to delineate the left ventricular endocardial borders. Strain was performed and the global longitudinal strain is indeterminate. The left ventricular internal cavity size was moderately dilated. There is moderate left ventricular hypertrophy. Left ventricular diastolic parameters are consistent with Grade I diastolic dysfunction (impaired relaxation). Right Ventricle: The right ventricular size is normal. No increase in right ventricular wall thickness. Right ventricular systolic function is normal. Left Atrium: Left atrial size was mildly dilated. Right Atrium: Right atrial size was normal in size. Pericardium: A small  pericardial effusion is present. The pericardial effusion is posterior to the left ventricle and anterior to the right ventricle. Mitral Valve: The mitral valve is abnormal. There is mild thickening of the mitral valve leaflet(s). There is mild calcification of the mitral valve leaflet(s). Moderate mitral annular calcification. No evidence of mitral valve regurgitation. No evidence  of mitral valve stenosis. Tricuspid Valve: The tricuspid valve is normal in structure. Tricuspid valve regurgitation is not demonstrated. No evidence of tricuspid stenosis. Aortic Valve: The aortic valve is tricuspid. There is mild calcification of the aortic valve. There is mild thickening of the aortic valve. Aortic valve regurgitation is not visualized. Aortic valve sclerosis is present, with no evidence of aortic valve stenosis. Pulmonic Valve: The pulmonic valve was normal in structure. Pulmonic valve regurgitation is trivial. No evidence of pulmonic stenosis. Aorta: The aortic root is normal in size and structure. Venous: The inferior vena cava is normal in size with greater than 50% respiratory variability, suggesting right atrial pressure of 3 mmHg. IAS/Shunts: No atrial level shunt detected by color flow Doppler. Additional Comments: 3D was performed not requiring image post processing on an independent workstation and was indeterminate.  LEFT VENTRICLE PLAX 2D LVIDd:         6.00 cm   Diastology LVIDs:         4.60 cm   LV e' medial:   3.70 cm/s LV PW:  1.50 cm   LV E/e' medial: 13.0 LV IVS:        1.40 cm LVOT diam:     1.80 cm LV SV:         25 LV SV Index:   10 LVOT Area:     2.54 cm  RIGHT VENTRICLE RV S prime:     5.96 cm/s LEFT ATRIUM             Index LA diam:        4.10 cm 1.63 cm/m LA Vol (A2C):   80.4 ml 31.98 ml/m LA Vol (A4C):   76.1 ml 30.27 ml/m LA Biplane Vol: 78.8 ml 31.34 ml/m  AORTIC VALVE LVOT Vmax:   65.57 cm/s LVOT Vmean:  45.700 cm/s LVOT VTI:    0.097 m  AORTA Ao Root diam: 3.50 cm MITRAL  VALVE MV Area (PHT): 2.81 cm    SHUNTS MV Decel Time: 270 msec    Systemic VTI:  0.10 m MV E velocity: 48.05 cm/s  Systemic Diam: 1.80 cm MV A velocity: 68.55 cm/s MV E/A ratio:  0.70 Janelle Mediate MD Electronically signed by Janelle Mediate MD Signature Date/Time: 05/17/2024/3:24:41 PM    Final    US  Venous Img Lower Bilateral (DVT) Result Date: 05/17/2024 CLINICAL DATA:  Lower extremity pain. EXAM: BILATERAL LOWER EXTREMITY VENOUS DOPPLER ULTRASOUND TECHNIQUE: Gray-scale sonography with graded compression, as well as color Doppler and duplex ultrasound were performed to evaluate the lower extremity deep venous systems from the level of the common femoral vein and including the common femoral, femoral, profunda femoral, popliteal and calf veins including the posterior tibial, peroneal and gastrocnemius veins when visible. The superficial great saphenous vein was also interrogated. Spectral Doppler was utilized to evaluate flow at rest and with distal augmentation maneuvers in the common femoral, femoral and popliteal veins. COMPARISON:  None Available. FINDINGS: RIGHT LOWER EXTREMITY Common Femoral Vein: No evidence of thrombus. Normal compressibility, respiratory phasicity and response to augmentation. Saphenofemoral Junction: No evidence of thrombus. Normal compressibility and flow on color Doppler imaging. Profunda Femoral Vein: No evidence of thrombus. Normal compressibility and flow on color Doppler imaging. Femoral Vein: No evidence of thrombus. Normal compressibility, respiratory phasicity and response to augmentation. Popliteal Vein: No evidence of thrombus. Normal compressibility, respiratory phasicity and response to augmentation. Calf Veins: Absent color Doppler flow within the noncompressible right posterior tibial vein noted. Superficial Great Saphenous Vein: No evidence of thrombus. Normal compressibility. Venous Reflux:  None. Other Findings:  None. LEFT LOWER EXTREMITY Common Femoral Vein: No evidence  of thrombus. Normal compressibility, respiratory phasicity and response to augmentation. Saphenofemoral Junction: No evidence of thrombus. Normal compressibility and flow on color Doppler imaging. Profunda Femoral Vein: No evidence of thrombus. Normal compressibility and flow on color Doppler imaging. Femoral Vein: No evidence of thrombus. Normal compressibility, respiratory phasicity and response to augmentation. Popliteal Vein: No evidence of thrombus. Normal compressibility, respiratory phasicity and response to augmentation. Calf Veins: No evidence of thrombus. Normal compressibility and flow on color Doppler imaging. Superficial Great Saphenous Vein: No evidence of thrombus. Normal compressibility. Venous Reflux:  None. Other Findings:  None. IMPRESSION: 1. Positive for deep vein thrombosis involving the right posterior tibial vein. 2. No evidence for deep vein thrombosis within the left lower extremity. These results will be called to the ordering clinician or representative by the Radiologist Assistant, and communication documented in the PACS or [employer]. Electronically Signed   By: Kimberley Penman M.D.   On: 05/17/2024 11:15  DG Chest Port 1 View Result Date: 05/16/2024 CLINICAL DATA:  sob EXAM: PORTABLE CHEST - 1 VIEW COMPARISON:  October 08, 2023 FINDINGS: Diffuse bilateral perihilar interstitial opacities. Worsening enlargement of the cardiac silhouette. No acute fracture or destructive lesions. Multilevel thoracic osteophytosis. IMPRESSION: 1. Diffuse bilateral perihilar interstitial opacities, likely reflecting interstitial edema. 2. Worsening enlargement of the cardiac silhouette, possibly from progressive cardiomegaly or superimposed pericardial effusion. Electronically Signed   By: Rance Burrows M.D.   On: 05/16/2024 15:52    Demaris Fillers, DO  [facility]  If 7PM-7AM, please contact night-coverage [email or web address] Password [email or web address] 05/22/2024, 1:45 PM   LOS: 2 days

## 2024-05-22 NOTE — Progress Notes (Signed)
 Rounding Note   Patient Name: Maureen Jackson Date of Encounter: 05/22/2024  [facility] Cardiologist: Armida Lander, MD   Subjective Some ongoing SOB  Scheduled Meds:  acetaminophen   1,000 mg Oral Q8H   apixaban  10 mg Oral BID   Followed by   Cecily Cohen ON 05/24/2024] apixaban  5 mg Oral BID   aspirin  EC  81 mg Oral Daily   atorvastatin   20 mg Oral Daily   brimonidine   1 drop Both Eyes BID   And   timolol   1 drop Both Eyes BID   calcium  carbonate  4 tablet Oral BID WC   empagliflozin   10 mg Oral Daily   insulin  aspart  0-15 Units Subcutaneous TID WC   levothyroxine   250 mcg Oral QAC breakfast   metoprolol  succinate  25 mg Oral Daily   sacubitril -valsartan   1 tablet Oral BID   spironolactone   12.5 mg Oral Daily   Continuous Infusions:  PRN Meds: acetaminophen , albuterol , fentaNYL  (SUBLIMAZE ) injection, ondansetron  (ZOFRAN ) IV, oxyCODONE , prochlorperazine, senna-docusate   Vital Signs  Vitals:   05/21/24 1947 05/22/24 0421 05/22/24 0500 05/22/24 0703  BP: 118/77 106/66  111/68  Pulse: 66 65  65  Resp: 18 20  (!) 22  Temp: 98.1 F (36.7 C) 98.2 F (36.8 C)  98 F (36.7 C)  TempSrc: Oral Oral  Oral  SpO2: 94% 92%    Weight:   (!) 163.3 kg   Height:        Intake/Output Summary (Last 24 hours) at 05/22/2024 0855 Last data filed at 05/22/2024 0519 Gross per 24 hour  Intake 960 ml  Output 1350 ml  Net -390 ml      05/22/2024    5:00 AM 05/21/2024    5:00 AM 05/20/2024    5:23 AM  Last 3 Weights  Weight (lbs) 360 lb 0.2 oz 350 lb 15.6 oz 354 lb 8 oz  Weight (kg) 163.3 kg 159.2 kg 160.8 kg      Telemetry SR - Personally Reviewed  ECG  N/a - Personally Reviewed  Physical Exam  GEN: No acute distress.   Neck: No JVD Cardiac: RRR, no murmurs, rubs, or gallops.  Respiratory: faint crackles bilateral bases GI: Soft, nontender, non-distended  MS: No edema; No deformity. Neuro:  Nonfocal  Psych: Normal affect   Labs High Sensitivity  Troponin:   Recent Labs  Lab 05/16/24 1536 05/16/24 1713  TROPONINIHS 25* 29*     Chemistry Recent Labs  Lab 05/16/24 1536 05/17/24 0652 05/19/24 0606 05/19/24 1200 05/20/24 0428 05/21/24 0440 05/22/24 0447  NA 142   < > 141   < > 141 138 140  K 3.8   < > 4.7   < > 4.8 4.5 4.6  CL 108   < > 106   < > 105 104 106  CO2 26   < > 24   < > 29 27 27   GLUCOSE 132*   < > 136*   < > 128* 138* 152*  BUN 19   < > 35*   < > 36* 35* 37*  CREATININE 1.27*   < > 2.32*   < > 1.99* 1.74* 1.68*  CALCIUM  8.1*   < > 8.1*   < > 8.3* 8.6* 8.8*  MG  --   --  2.1  --   --  2.3  --   PROT 7.0  --   --   --   --   --   --  ALBUMIN  3.1*  --   --   --   --   --   --   AST 42*  --   --   --   --   --   --   ALT 31  --   --   --   --   --   --   ALKPHOS 248*  --   --   --   --   --   --   BILITOT 1.1  --   --   --   --   --   --   GFRNONAA 47*   < > 23*   < > 27* 32* 33*  ANIONGAP 8   < > 11   < > 7 7 7    < > = values in this interval not displayed.    Lipids No results for input(s): CHOL, TRIG, HDL, LABVLDL, LDLCALC, CHOLHDL in the last 168 hours.  Hematology Recent Labs  Lab 05/20/24 0428 05/21/24 0440 05/22/24 0447  WBC 7.0 7.2 6.8  RBC 3.93 3.80* 3.80*  HGB 10.8* 10.5* 10.3*  HCT 35.7* 34.4* 34.0*  MCV 90.8 90.5 89.5  MCH 27.5 27.6 27.1  MCHC 30.3 30.5 30.3  RDW 14.9 15.0 15.0  PLT 225 224 197   Thyroid   Recent Labs  Lab 05/16/24 1713 05/17/24 0652  TSH 135.541*  --   FREET4  --  <0.25*    BNP Recent Labs  Lab 05/16/24 1536 05/17/24 0652  BNP 141.0* 94.0    DDimer No results for input(s): DDIMER in the last 168 hours.   Radiology  No results found.   Patient Profile    WANETTE ROBISON is a 68 y.o. female with a hx of pericardial effusion s/p window in 12/2022, HFrEF 2/2 ischemic cardiomyopathy, CAD (s/p 2018 DES to LAD, RCA x2, and medically managed LCX as opposed to CABG), PAD, HTN, T2DM, OSA not on CPAP, CKD and morbid obesity  who is being seen  05/19/2024 for the evaluation of HF at the request of Dr. Lincoln Renshaw.    Assessment & Plan  1.Acute on chronic HFrEF - 08/2023 echo: LVEF 30-35% -05/2024 echo: LVE 30-35%, grade I dd, normal RV - CXR +bilateral edema. BNP 141  - initially diuresed, rise in Cr and diuretics held. By I/Os negative 2.9 L. Bed weights appear inaccurate. Cr trending back down.  - difficult to assess volume status by exam. Some faint crackles on exam. Some ongoing SOB/DOE. Will try dosing IV lasix  60mg  x 1 today and follow output and renal function - medical therapy with toprol  25mg  daily, entresto  49/51mg  bid, aldactone  12.5mg  daily. GFR 33, hold on SGLT2i. - severe hypothyroidism likely playing a role    2. DVT - started on eliquis  3.CAD - status post DES to the LAD and RCA in 2018 with medically managed circumflex disease. No active angina.   4.Hypothyroidism - TSH very elevated 135, free T4 not detectable   5. AKI on CKD - improving off diuretics,  -historically lable Crt 1 to 1.6.  - continue to monitor    For questions or updates, please contact [facility] Please consult [email or web address] for contact info under     Signed, Armida Lander, MD  05/22/2024, 8:55 AM

## 2024-05-22 NOTE — Plan of Care (Signed)
   Problem: Education: Goal: Knowledge of General Education information will improve Description: Including pain rating scale, medication(s)/side effects and non-pharmacologic comfort measures Outcome: Progressing   Problem: Clinical Measurements: Goal: Ability to maintain clinical measurements within normal limits will improve Outcome: Progressing Goal: Diagnostic test results will improve Outcome: Progressing

## 2024-05-23 DIAGNOSIS — I82401 Acute embolism and thrombosis of unspecified deep veins of right lower extremity: Secondary | ICD-10-CM | POA: Diagnosis not present

## 2024-05-23 DIAGNOSIS — N1832 Chronic kidney disease, stage 3b: Secondary | ICD-10-CM | POA: Diagnosis not present

## 2024-05-23 DIAGNOSIS — I5023 Acute on chronic systolic (congestive) heart failure: Secondary | ICD-10-CM | POA: Diagnosis not present

## 2024-05-23 DIAGNOSIS — N178 Other acute kidney failure: Secondary | ICD-10-CM | POA: Diagnosis not present

## 2024-05-23 LAB — GLUCOSE, CAPILLARY
Glucose-Capillary: 104 mg/dL — ABNORMAL HIGH (ref 70–99)
Glucose-Capillary: 127 mg/dL — ABNORMAL HIGH (ref 70–99)
Glucose-Capillary: 129 mg/dL — ABNORMAL HIGH (ref 70–99)

## 2024-05-23 LAB — BASIC METABOLIC PANEL WITH GFR
Anion gap: 8 (ref 5–15)
BUN: 42 mg/dL — ABNORMAL HIGH (ref 8–23)
CO2: 30 mmol/L (ref 22–32)
Calcium: 8.6 mg/dL — ABNORMAL LOW (ref 8.9–10.3)
Chloride: 103 mmol/L (ref 98–111)
Creatinine, Ser: 1.79 mg/dL — ABNORMAL HIGH (ref 0.44–1.00)
GFR, Estimated: 31 mL/min — ABNORMAL LOW (ref >60)
Glucose, Bld: 108 mg/dL — ABNORMAL HIGH (ref 70–99)
Potassium: 4.7 mmol/L (ref 3.5–5.1)
Sodium: 141 mmol/L (ref 135–145)

## 2024-05-23 LAB — MAGNESIUM: Magnesium: 2.2 mg/dL (ref 1.7–2.4)

## 2024-05-23 MED ORDER — FUROSEMIDE 40 MG PO TABS
60.0000 mg | ORAL_TABLET | Freq: Every day | ORAL | Status: DC
  Administered 2024-05-24: 60 mg via ORAL
  Filled 2024-05-23: qty 1

## 2024-05-23 NOTE — Progress Notes (Signed)
 Rounding Note   Patient Name: Maureen Jackson Date of Encounter: 05/23/2024  [facility] Cardiologist: Armida Lander, MD   Subjective SOB has improved.   Scheduled Meds:  acetaminophen   1,000 mg Oral Q8H   apixaban   10 mg Oral BID   Followed by   Cecily Cohen ON 05/24/2024] apixaban   5 mg Oral BID   aspirin  EC  81 mg Oral Daily   atorvastatin   20 mg Oral Daily   brimonidine   1 drop Both Eyes BID   And   timolol   1 drop Both Eyes BID   calcium  carbonate  4 tablet Oral BID WC   empagliflozin   10 mg Oral Daily   insulin  aspart  0-15 Units Subcutaneous TID WC   levothyroxine   250 mcg Oral QAC breakfast   metoprolol  succinate  25 mg Oral Daily   sacubitril -valsartan   1 tablet Oral BID   spironolactone   12.5 mg Oral Daily   Continuous Infusions:  PRN Meds: acetaminophen , albuterol , fentaNYL  (SUBLIMAZE ) injection, ondansetron  (ZOFRAN ) IV, oxyCODONE , prochlorperazine , senna-docusate   Vital Signs  Vitals:   05/22/24 2036 05/22/24 2058 05/23/24 0434 05/23/24 0557  BP: (!) 89/36 109/68 (!) 102/57   Pulse: 65 67 63   Resp: 20 20 18    Temp: 97.7 F (36.5 C)  97.9 F (36.6 C)   TempSrc: Oral  Oral   SpO2: 92% 94% 95%   Weight:    (!) 162.3 kg  Height:        Intake/Output Summary (Last 24 hours) at 05/23/2024 0834 Last data filed at 05/23/2024 0828 Gross per 24 hour  Intake 460 ml  Output 1600 ml  Net -1140 ml      05/23/2024    5:57 AM 05/22/2024    5:00 AM 05/21/2024    5:00 AM  Last 3 Weights  Weight (lbs) 357 lb 12.9 oz 360 lb 0.2 oz 350 lb 15.6 oz  Weight (kg) 162.3 kg 163.3 kg 159.2 kg      Telemetry NSR - Personally Reviewed  ECG  N/a - Personally Reviewed  Physical Exam  GEN: No acute distress.   Neck: No JVD Cardiac: RRR, no murmurs, rubs, or gallops.  Respiratory: Clear to auscultation bilaterally. GI: Soft, nontender, non-distended  MS: No edema; No deformity. Neuro:  Nonfocal  Psych: Normal affect   Labs High Sensitivity Troponin:    Recent Labs  Lab 05/16/24 1536 05/16/24 1713  TROPONINIHS 25* 29*     Chemistry Recent Labs  Lab 05/16/24 1536 05/17/24 0652 05/19/24 0606 05/19/24 1200 05/21/24 0440 05/22/24 0447 05/23/24 0447  NA 142   < > 141   < > 138 140 141  K 3.8   < > 4.7   < > 4.5 4.6 4.7  CL 108   < > 106   < > 104 106 103  CO2 26   < > 24   < > 27 27 30   GLUCOSE 132*   < > 136*   < > 138* 152* 108*  BUN 19   < > 35*   < > 35* 37* 42*  CREATININE 1.27*   < > 2.32*   < > 1.74* 1.68* 1.79*  CALCIUM  8.1*   < > 8.1*   < > 8.6* 8.8* 8.6*  MG  --   --  2.1  --  2.3  --  2.2  PROT 7.0  --   --   --   --   --   --  ALBUMIN  3.1*  --   --   --   --   --   --   AST 42*  --   --   --   --   --   --   ALT 31  --   --   --   --   --   --   ALKPHOS 248*  --   --   --   --   --   --   BILITOT 1.1  --   --   --   --   --   --   GFRNONAA 47*   < > 23*   < > 32* 33* 31*  ANIONGAP 8   < > 11   < > 7 7 8    < > = values in this interval not displayed.    Lipids No results for input(s): CHOL, TRIG, HDL, LABVLDL, LDLCALC, CHOLHDL in the last 168 hours.  Hematology Recent Labs  Lab 05/20/24 0428 05/21/24 0440 05/22/24 0447  WBC 7.0 7.2 6.8  RBC 3.93 3.80* 3.80*  HGB 10.8* 10.5* 10.3*  HCT 35.7* 34.4* 34.0*  MCV 90.8 90.5 89.5  MCH 27.5 27.6 27.1  MCHC 30.3 30.5 30.3  RDW 14.9 15.0 15.0  PLT 225 224 197   Thyroid   Recent Labs  Lab 05/16/24 1713 05/17/24 0652  TSH 135.541*  --   FREET4  --  <0.25*    BNP Recent Labs  Lab 05/16/24 1536 05/17/24 0652  BNP 141.0* 94.0    DDimer No results for input(s): DDIMER in the last 168 hours.   Radiology  No results found.  Cardiac Studies   Patient Profile   Maureen Jackson is a 68 y.o. female with a hx of pericardial effusion s/p window in 12/2022, HFrEF 2/2 ischemic cardiomyopathy, CAD (s/p 2018 DES to LAD, RCA x2, and medically managed LCX as opposed to CABG), PAD, HTN, T2DM, OSA not on CPAP, CKD and morbid obesity who is being seen  05/19/2024 for the evaluation of HF at the request of Dr. Lincoln Renshaw.     Assessment & Plan   1.Acute on chronic HFrEF - 08/2023 echo: LVEF 30-35% -05/2024 echo: LVE 30-35%, grade I dd, normal RV - CXR +bilateral edema. BNP 141   - initially diuresed, rise in Cr and diuretics held. By I/Os negative 4.1 L. Received IV lasix  60mg  x 1 yesterday. Slight uptrend in Cr  Bed weights appear inaccurate.  - difficult to assess volume status by exam.  - based on soft bp's and uptrend in Cr with additional diuresis suspect she is euvolemic today.    - medical therapy with toprol  25mg  daily, entresto  49/51mg  bid, aldactone  12.5mg  daily, jardiance  10mg   - poor compliance with home diuretic, was not dosage failure. Restart oral lasix  60mg  daily tomorrow given soft bp's today     2. DVT - started on eliquis    3.CAD - status post DES to the LAD and RCA in 2018 with medically managed circumflex disease. No active angina.    4.Hypothyroidism - TSH very elevated 135, free T4 not detectable     5. AKI on CKD - improving off diuretics,  -historically lable Crt 1 to 1.6.  - continue to monitor   Would monitor bp's today, if stable discharge tomorrow  For questions or updates, please contact [facility] Please consult [email or web address] for contact info under     Signed, Armida Lander, MD  05/23/2024, 8:34 AM

## 2024-05-23 NOTE — Progress Notes (Signed)
 PROGRESS NOTE  Maureen Jackson:[id number] DOB: Jun 27, 1956 DOA: 05/16/2024 PCP: Veda Gerald, MD  Brief History:  68 y.o. female,  with HFrEF 2/2 ischemic cardiomyopathy, hypothyroidism, CAD s/p PCI to the LAD and RCA , PAD, HTN, T2DM, OSA not on CPAP, CKD and morbid obesity/nonischemic cardiomyopathy with a EF 35%, history of pericardial effusion status post pericardial window.  Patient presents to ED secondary to complaints of shortness of breath, she denies any chest pain, fever, chills, reports shortness of breath progressive over the last week, but significant today which prompted her to come to ED, patient report she has not been taking her Lasix .  In ED chest x-ray significant for enlarged cardiac silhouette, possible pulmonary edema, she has elevated BNP, bedside ultrasound by ED physician with finding of small pericardial effusion, she was started on IV lasix , ED discussed with cardiology at [facility], who recommended admission to [facility] and to obtain echo and to diurese.   Assessment/Plan:  Acute on chronic HFrEF -pt admits she is not taking lasix  at home because it causes frequent urination and she is mostly immobile.    -she was started on IV Lasix  60 mg IV twice daily but now held due to bump in creatinine, following daily weights, strict ins and out -05/17/24 2D echo: no significant pericardial effusion, LVEF remains 30-35% with global hypokinesis, grade 1 DD, aortic valve sclerosis with no aortic valve stenosis. -repeated CXR shows modest improvement in interstitial edema -6/8 holding diuretics due to bump in creatinine -05/22/24--redose lasix  IV -05/23/24--hold IV lasix  -hold potassium supplementation -recheck BMP in AM  -pt reports dry weight is about 340#  -encouraged sodium restriction 2 gm/day and fluid restriction -appreciate cardiology   History of pericardial effusion s/p window - History of pericardial effusion, failed pericardiocentesis; s/p  window in 12/2022 - Chest x-ray with increased cardiac silhouette, but bedside echo by ED physician with no evidence of large pleural effusion, as well she is status post pericardial window which should prevent recurrence of effusion - follow up repeat 2D echo with report of small pericardial effusion present - outpatient providers felt to be secondary to hypothyroidism which remains uncontrolled as evidenced by TSH 135.5 and free T4<0.25.  - pt endorses that she was not taking synthroid  regularly   DVT right post tibial vein Right leg pain  - suspect precipitated by prolonged immobility  - pharm D dosed apixaban  started 05/17/24  - appreciate cardiology team recs, for length of DVT treatment, take aspirin  81 mg and hold plavix  - resume plavix  and stop aspirin  after completing 3 months treatment for DVT - counseled patient regarding high bleeding risk    CAD - outpatient cardiology records indicate she had LHC in 2018 demonstrating severe multivessel coronary artery disease. Patient underwent PCI as opposed to CABG with DES to the LAD, x 2 to the RCA and left circumflex disease managed medically.  - continue Lipitor , Toprol , holding plavix  during DVT treatment and taking aspirin  81 mg daily - She denies any chest pain     AKI on CKD stage 3b - baseline creatiine 1.2-1.5 -holding diuretics 6/8 to 6/12 - creatinine peaked 2.33   Morbid obesity -Body mass index is 56.25 kg/m. -pt reports she has been on Ozempic since 2023    Chronic pain  - ordered a bariatric bed - ordered PRN meds for pain   Hypertension - Continue with metoprolol  succinate, Spiro, Entresto    Diabetes mellitus,  type II -She is on Jardiance  and Ozempic at home, resumed Jardiance , Ozempic is nonformulary, continue insulin  sliding scale   Hypothyroidism, post thyroidectomy - uncontrolled as evidenced by TSH 135  - checked free T4---<0.25  - Initially insistent that she takes levothyroxine  daily -  - later endorsed  she was not very compliant - compliance is a concern as she tells me she does not take it regularly, not on empty stomach and takes it with her other pills when she does manage to take it. I counseled her at length about why she is not feeling better and the need to take the medication correctly.  - she says she is followed by endocrinology and she is on 250 mcg of levothyroxine  daily which is the correct dose based on her weight of 256 mcg/day and she says she has been on this dose for longer than 1 year - last visit with endocrinology was in May 2023: she was counseled regarding the correct intake of her thyroid  hormone, on empty stomach at fasting, with water, separated by at least 30 minutes from breakfast and other medications,  and separated by more than 4 hours from calcium , iron , multivitamins, acid reflux medications (PPIs).    Deconditioning - consulted PT, OT - up to chair, OOB each shift - pt rrefuse SNF - she reports that she lives alone.     Family Communication: no  Family at bedside   Consultants:  cardiology   Code Status:  FULL   DVT Prophylaxis: apixaban      Procedures: As Listed in Progress Note Above   Antibiotics: None         Subjective: Patient denies fevers, chills, headache, chest pain, dyspnea, nausea, vomiting, diarrhea, abdominal pain, dysuria, hematuria, hematochezia, and melena.   Objective: Vitals:   05/22/24 2058 05/23/24 0434 05/23/24 0557 05/23/24 1232  BP: 109/68 (!) 102/57  (!) 101/56  Pulse: 67 63  65  Resp: 20 18  20   Temp:  97.9 F (36.6 C)  98 F (36.7 C)  TempSrc:  Oral  Oral  SpO2: 94% 95%  97%  Weight:   (!) 162.3 kg   Height:        Intake/Output Summary (Last 24 hours) at 05/23/2024 1637 Last data filed at 05/23/2024 1516 Gross per 24 hour  Intake 460 ml  Output 1850 ml  Net -1390 ml   Weight change: -1 kg Exam:  General:  Pt is alert, follows commands appropriately, not in acute distress HEENT: No icterus, No  thrush, No neck mass, [place]/AT Cardiovascular: RRR, S1/S2, no rubs, no gallops Respiratory: fine bibasilar crackles. No wheeze Abdomen: Soft/+BS, non tender, non distended, no guarding Extremities: No edema, No lymphangitis, No petechiae, No rashes, no synovitis   Data Reviewed: I have personally reviewed following labs and imaging studies Basic Metabolic Panel: Recent Labs  Lab 05/19/24 0606 05/19/24 1200 05/20/24 0428 05/21/24 0440 05/22/24 0447 05/23/24 0447  NA 141 140 141 138 140 141  K 4.7 4.5 4.8 4.5 4.6 4.7  CL 106 106 105 104 106 103  CO2 24 27 29 27 27 30   GLUCOSE 136* 144* 128* 138* 152* 108*  BUN 35* 35* 36* 35* 37* 42*  CREATININE 2.32* 2.33* 1.99* 1.74* 1.68* 1.79*  CALCIUM  8.1* 8.2* 8.3* 8.6* 8.8* 8.6*  MG 2.1  --   --  2.3  --  2.2   Liver Function Tests: No results for input(s): AST, ALT, ALKPHOS, BILITOT, PROT, ALBUMIN  in the last 168 hours. No  results for input(s): LIPASE, AMYLASE in the last 168 hours. No results for input(s): AMMONIA in the last 168 hours. Coagulation Profile: No results for input(s): INR, PROTIME in the last 168 hours. CBC: Recent Labs  Lab 05/17/24 0652 05/19/24 0606 05/20/24 0428 05/21/24 0440 05/22/24 0447  WBC 5.7 6.8 7.0 7.2 6.8  HGB 10.9* 10.6* 10.8* 10.5* 10.3*  HCT 35.9* 34.8* 35.7* 34.4* 34.0*  MCV 88.2 91.6 90.8 90.5 89.5  PLT 194 217 225 224 197   Cardiac Enzymes: No results for input(s): CKTOTAL, CKMB, CKMBINDEX, TROPONINI in the last 168 hours. BNP: Invalid input(s): POCBNP CBG: Recent Labs  Lab 05/22/24 1620 05/22/24 2038 05/23/24 0717 05/23/24 1116 05/23/24 1618  GLUCAP 115* 96 104* 127* 129*   HbA1C: No results for input(s): HGBA1C in the last 72 hours. Urine analysis:    Component Value Date/Time   COLORURINE YELLOW 12/31/2022 1745   APPEARANCEUR HAZY (A) 12/31/2022 1745   LABSPEC 1.013 12/31/2022 1745   PHURINE 5.0 12/31/2022 1745   GLUCOSEU >=500 (A)  12/31/2022 1745   HGBUR NEGATIVE 12/31/2022 1745   BILIRUBINUR NEGATIVE 12/31/2022 1745   KETONESUR NEGATIVE 12/31/2022 1745   PROTEINUR NEGATIVE 12/31/2022 1745   UROBILINOGEN 0.2 10/02/2015 2320   NITRITE NEGATIVE 12/31/2022 1745   LEUKOCYTESUR NEGATIVE 12/31/2022 1745   Sepsis Labs: @LABRCNTIP (procalcitonin:4,lacticidven:4) ) Recent Results (from the past 240 hours)  Resp panel by RT-PCR (RSV, Flu A&B, Covid) Anterior Nasal Swab     Status: None   Collection Time: 05/16/24  3:10 PM   Specimen: Anterior Nasal Swab  Result Value Ref Range Status   SARS Coronavirus 2 by RT PCR NEGATIVE NEGATIVE Final    Comment: (NOTE) SARS-CoV-2 target nucleic acids are NOT DETECTED.  The SARS-CoV-2 RNA is generally detectable in upper respiratory specimens during the acute phase of infection. The lowest concentration of SARS-CoV-2 viral copies this assay can detect is 138 copies/mL. A negative result does not preclude SARS-Cov-2 infection and should not be used as the sole basis for treatment or other patient management decisions. A negative result may occur with  improper specimen collection/handling, submission of specimen other than nasopharyngeal swab, presence of viral mutation(s) within the areas targeted by this assay, and inadequate number of viral copies(<138 copies/mL). A negative result must be combined with clinical observations, patient history, and epidemiological information. The expected result is Negative.  Fact Sheet for Patients:  BloggerCourse.com  Fact Sheet for Healthcare Providers:  SeriousBroker.it  This test is no t yet approved or cleared by the [place]  FDA and  has been authorized for detection and/or diagnosis of SARS-CoV-2 by FDA under an Emergency Use Authorization (EUA). This EUA will remain  in effect (meaning this test can be used) for the duration of the COVID-19 declaration under Section 564(b)(1)  of the Act, 21 U.S.C.section 360bbb-3(b)(1), unless the authorization is terminated  or revoked sooner.       Influenza A by PCR NEGATIVE NEGATIVE Final   Influenza B by PCR NEGATIVE NEGATIVE Final    Comment: (NOTE) The Xpert Xpress SARS-CoV-2/FLU/RSV plus assay is intended as an aid in the diagnosis of influenza from Nasopharyngeal swab specimens and should not be used as a sole basis for treatment. Nasal washings and aspirates are unacceptable for Xpert Xpress SARS-CoV-2/FLU/RSV testing.  Fact Sheet for Patients: BloggerCourse.com  Fact Sheet for Healthcare Providers: SeriousBroker.it  This test is not yet approved or cleared by the [place]  FDA and has been authorized for detection and/or diagnosis of SARS-CoV-2 by  FDA under an Emergency Use Authorization (EUA). This EUA will remain in effect (meaning this test can be used) for the duration of the COVID-19 declaration under Section 564(b)(1) of the Act, 21 U.S.C. section 360bbb-3(b)(1), unless the authorization is terminated or revoked.     Resp Syncytial Virus by PCR NEGATIVE NEGATIVE Final    Comment: (NOTE) Fact Sheet for Patients: BloggerCourse.com  Fact Sheet for Healthcare Providers: SeriousBroker.it  This test is not yet approved or cleared by the [place]  FDA and has been authorized for detection and/or diagnosis of SARS-CoV-2 by FDA under an Emergency Use Authorization (EUA). This EUA will remain in effect (meaning this test can be used) for the duration of the COVID-19 declaration under Section 564(b)(1) of the Act, 21 U.S.C. section 360bbb-3(b)(1), unless the authorization is terminated or revoked.  Performed at [facility], [address] [place], [place] [address]      Scheduled Meds:  acetaminophen   1,000 mg Oral Q8H   apixaban   10 mg Oral BID   Followed by   Cecily Cohen ON 05/24/2024] apixaban    5 mg Oral BID   aspirin  EC  81 mg Oral Daily   atorvastatin   20 mg Oral Daily   brimonidine   1 drop Both Eyes BID   And   timolol   1 drop Both Eyes BID   calcium  carbonate  4 tablet Oral BID WC   empagliflozin   10 mg Oral Daily   [START ON 05/24/2024] furosemide   60 mg Oral Daily   insulin  aspart  0-15 Units Subcutaneous TID WC   levothyroxine   250 mcg Oral QAC breakfast   metoprolol  succinate  25 mg Oral Daily   sacubitril -valsartan   1 tablet Oral BID   spironolactone   12.5 mg Oral Daily   Continuous Infusions:  Procedures/Studies: DG CHEST PORT 1 VIEW Result Date: 05/19/2024 CLINICAL DATA:  Pulmonary edema EXAM: PORTABLE CHEST 1 VIEW COMPARISON:  Chest x-ray performed May 16, 2024 FINDINGS: Modest improvement in interstitial edema. Enlarged heart. Limited visualization of the retrocardiac region. IMPRESSION: 1. Modest improvement in interstitial edema. 2. Enlarged heart. Electronically Signed   By: Reagan Camera M.D.   On: 05/19/2024 08:25   ECHOCARDIOGRAM COMPLETE Result Date: 05/17/2024    ECHOCARDIOGRAM REPORT   Patient Name:   Maureen Jackson Date of Exam: 05/17/2024 Medical Rec #:  [id number]       Height:       65.0 in Accession #:    [id number]      Weight:       351.6 lb Date of Birth:  July 15, 1956        BSA:          2.514 m Patient Age:    68 years        BP:           128/92 mmHg Patient Gender: F               HR:           79 bpm. Exam Location:  [facility] Procedure: 2D Echo, Cardiac Doppler, Color Doppler and Intracardiac            Opacification Agent (Both Spectral and Color Flow Doppler were            utilized during procedure). Indications:    Pericardial effusion I31.3  History:        Patient has prior history of Echocardiogram examinations, most  recent 08/20/2023. CHF and Cardiomyopathy, CAD, TIA; Risk                 Factors:Hypertension, Diabetes, Dyslipidemia and Sleep Apnea.                 Morbid Obesity.  Sonographer:    Denese Finn RCS Referring Phys:  [phone] [place]  Sonographer Comments: Technically difficult study due to poor echo windows and suboptimal subcostal window. IMPRESSIONS  1. Left ventricular ejection fraction, by estimation, is 30 to 35%. The left ventricle has moderately decreased function. The left ventricle demonstrates global hypokinesis. The left ventricular internal cavity size was moderately dilated. There is moderate left ventricular hypertrophy. Left ventricular diastolic parameters are consistent with Grade I diastolic dysfunction (impaired relaxation).  2. Right ventricular systolic function is normal. The right ventricular size is normal.  3. Left atrial size was mildly dilated.  4. A small pericardial effusion is present. The pericardial effusion is posterior to the left ventricle and anterior to the right ventricle.  5. The mitral valve is abnormal. No evidence of mitral valve regurgitation. No evidence of mitral stenosis. Moderate mitral annular calcification.  6. The aortic valve is tricuspid. There is mild calcification of the aortic valve. There is mild thickening of the aortic valve. Aortic valve regurgitation is not visualized. Aortic valve sclerosis is present, with no evidence of aortic valve stenosis.  7. The inferior vena cava is normal in size with greater than 50% respiratory variability, suggesting right atrial pressure of 3 mmHg. FINDINGS  Left Ventricle: Left ventricular ejection fraction, by estimation, is 30 to 35%. The left ventricle has moderately decreased function. The left ventricle demonstrates global hypokinesis. Definity  contrast agent was given IV to delineate the left ventricular endocardial borders. Strain was performed and the global longitudinal strain is indeterminate. The left ventricular internal cavity size was moderately dilated. There is moderate left ventricular hypertrophy. Left ventricular diastolic parameters are consistent with Grade I diastolic dysfunction (impaired relaxation). Right  Ventricle: The right ventricular size is normal. No increase in right ventricular wall thickness. Right ventricular systolic function is normal. Left Atrium: Left atrial size was mildly dilated. Right Atrium: Right atrial size was normal in size. Pericardium: A small pericardial effusion is present. The pericardial effusion is posterior to the left ventricle and anterior to the right ventricle. Mitral Valve: The mitral valve is abnormal. There is mild thickening of the mitral valve leaflet(s). There is mild calcification of the mitral valve leaflet(s). Moderate mitral annular calcification. No evidence of mitral valve regurgitation. No evidence  of mitral valve stenosis. Tricuspid Valve: The tricuspid valve is normal in structure. Tricuspid valve regurgitation is not demonstrated. No evidence of tricuspid stenosis. Aortic Valve: The aortic valve is tricuspid. There is mild calcification of the aortic valve. There is mild thickening of the aortic valve. Aortic valve regurgitation is not visualized. Aortic valve sclerosis is present, with no evidence of aortic valve stenosis. Pulmonic Valve: The pulmonic valve was normal in structure. Pulmonic valve regurgitation is trivial. No evidence of pulmonic stenosis. Aorta: The aortic root is normal in size and structure. Venous: The inferior vena cava is normal in size with greater than 50% respiratory variability, suggesting right atrial pressure of 3 mmHg. IAS/Shunts: No atrial level shunt detected by color flow Doppler. Additional Comments: 3D was performed not requiring image post processing on an independent workstation and was indeterminate.  LEFT VENTRICLE PLAX 2D LVIDd:         6.00 cm  Diastology LVIDs:         4.60 cm   LV e' medial:   3.70 cm/s LV PW:         1.50 cm   LV E/e' medial: 13.0 LV IVS:        1.40 cm LVOT diam:     1.80 cm LV SV:         25 LV SV Index:   10 LVOT Area:     2.54 cm  RIGHT VENTRICLE RV S prime:     5.96 cm/s LEFT ATRIUM              Index LA diam:        4.10 cm 1.63 cm/m LA Vol (A2C):   80.4 ml 31.98 ml/m LA Vol (A4C):   76.1 ml 30.27 ml/m LA Biplane Vol: 78.8 ml 31.34 ml/m  AORTIC VALVE LVOT Vmax:   65.57 cm/s LVOT Vmean:  45.700 cm/s LVOT VTI:    0.097 m  AORTA Ao Root diam: 3.50 cm MITRAL VALVE MV Area (PHT): 2.81 cm    SHUNTS MV Decel Time: 270 msec    Systemic VTI:  0.10 m MV E velocity: 48.05 cm/s  Systemic Diam: 1.80 cm MV A velocity: 68.55 cm/s MV E/A ratio:  0.70 Janelle Mediate MD Electronically signed by Janelle Mediate MD Signature Date/Time: 05/17/2024/3:24:41 PM    Final    US  Venous Img Lower Bilateral (DVT) Result Date: 05/17/2024 CLINICAL DATA:  Lower extremity pain. EXAM: BILATERAL LOWER EXTREMITY VENOUS DOPPLER ULTRASOUND TECHNIQUE: Gray-scale sonography with graded compression, as well as color Doppler and duplex ultrasound were performed to evaluate the lower extremity deep venous systems from the level of the common femoral vein and including the common femoral, femoral, profunda femoral, popliteal and calf veins including the posterior tibial, peroneal and gastrocnemius veins when visible. The superficial great saphenous vein was also interrogated. Spectral Doppler was utilized to evaluate flow at rest and with distal augmentation maneuvers in the common femoral, femoral and popliteal veins. COMPARISON:  None Available. FINDINGS: RIGHT LOWER EXTREMITY Common Femoral Vein: No evidence of thrombus. Normal compressibility, respiratory phasicity and response to augmentation. Saphenofemoral Junction: No evidence of thrombus. Normal compressibility and flow on color Doppler imaging. Profunda Femoral Vein: No evidence of thrombus. Normal compressibility and flow on color Doppler imaging. Femoral Vein: No evidence of thrombus. Normal compressibility, respiratory phasicity and response to augmentation. Popliteal Vein: No evidence of thrombus. Normal compressibility, respiratory phasicity and response to augmentation. Calf Veins:  Absent color Doppler flow within the noncompressible right posterior tibial vein noted. Superficial Great Saphenous Vein: No evidence of thrombus. Normal compressibility. Venous Reflux:  None. Other Findings:  None. LEFT LOWER EXTREMITY Common Femoral Vein: No evidence of thrombus. Normal compressibility, respiratory phasicity and response to augmentation. Saphenofemoral Junction: No evidence of thrombus. Normal compressibility and flow on color Doppler imaging. Profunda Femoral Vein: No evidence of thrombus. Normal compressibility and flow on color Doppler imaging. Femoral Vein: No evidence of thrombus. Normal compressibility, respiratory phasicity and response to augmentation. Popliteal Vein: No evidence of thrombus. Normal compressibility, respiratory phasicity and response to augmentation. Calf Veins: No evidence of thrombus. Normal compressibility and flow on color Doppler imaging. Superficial Great Saphenous Vein: No evidence of thrombus. Normal compressibility. Venous Reflux:  None. Other Findings:  None. IMPRESSION: 1. Positive for deep vein thrombosis involving the right posterior tibial vein. 2. No evidence for deep vein thrombosis within the left lower extremity. These results will be called to the ordering  clinician or representative by the Radiologist Assistant, and communication documented in the PACS or [employer]. Electronically Signed   By: Kimberley Penman M.D.   On: 05/17/2024 11:15   DG Chest Port 1 View Result Date: 05/16/2024 CLINICAL DATA:  sob EXAM: PORTABLE CHEST - 1 VIEW COMPARISON:  October 08, 2023 FINDINGS: Diffuse bilateral perihilar interstitial opacities. Worsening enlargement of the cardiac silhouette. No acute fracture or destructive lesions. Multilevel thoracic osteophytosis. IMPRESSION: 1. Diffuse bilateral perihilar interstitial opacities, likely reflecting interstitial edema. 2. Worsening enlargement of the cardiac silhouette, possibly from progressive cardiomegaly or  superimposed pericardial effusion. Electronically Signed   By: Rance Burrows M.D.   On: 05/16/2024 15:52    Demaris Fillers, DO  [facility]  If 7PM-7AM, please contact night-coverage [email or web address] Password [facility] 05/23/2024, 4:37 PM   LOS: 3 days

## 2024-05-23 NOTE — Plan of Care (Signed)
   Problem: Education: Goal: Knowledge of General Education information will improve Description: Including pain rating scale, medication(s)/side effects and non-pharmacologic comfort measures Outcome: Progressing   Problem: Clinical Measurements: Goal: Ability to maintain clinical measurements within normal limits will improve Outcome: Progressing Goal: Diagnostic test results will improve Outcome: Progressing

## 2024-05-23 NOTE — Progress Notes (Signed)
 Physical Therapy Treatment Patient Details Name: Maureen Jackson MRN: [id number] DOB: 03-07-1956 Today's Date: 05/23/2024   History of Present Illness Maureen Jackson  is a 68 y.o. 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  - In ED chest x-ray significant for enlarged cardiac silhouette, possible pulmonary edema, she has elevated BNP, bedside ultrasound by ED physician with finding of small pericardial effusion, she was started on IV lasix , ED discussed with cardiology at [facility], who recommended admission to [facility] and to obtain echo and to diurese.    PT Comments  Patient tolerated PT session well. Pt received supine in bed. Practiced bed mobility/transfer with HOB flat this date. Patient demonstrates ability walk LE to EOB but required mod A for trunk elevation with use of railings. Patient agreeable to LE exercise of 5 STS, requiring CGA after re-education of hand placement. Patient demo forward/backwards steps in room with RW and CGA, limited due to fatigue and knee pain. Patient tolerated sitting in chair at end of session with call button in reach. Patient will benefit from continued skilled physical therapy acutely and in recommended venue in order to continue progress towards goals.     If plan is discharge home, recommend the following: A little help with walking and/or transfers;A little help with bathing/dressing/bathroom;Assist for transportation;Assistance with cooking/housework   Can  travel by Training and development officer (2 wheels)    Recommendations for Other Services       Precautions / Restrictions Precautions Precautions: Fall Recall of Precautions/Restrictions: Intact Restrictions Weight Bearing Restrictions Per Provider Order: No     Mobility  Bed Mobility Overal bed mobility: Needs Assistance Bed Mobility: Supine to Sit     Supine to sit: Mod assist     General bed mobility comments: Attempts supine to sit from flat bed, mod A needed for trunk elevation with pushing on railing    Transfers Overall transfer level: Needs assistance Equipment used: Rolling walker (2 wheels) Transfers: Sit to/from Stand, Bed to chair/wheelchair/BSC Sit to Stand: Contact guard assist   Step pivot transfers: Contact guard assist       General transfer comment: V cueing for pushing from bed to aid in STS, pt requiring CGA during for safety. 5 STS completed during session    Ambulation/Gait Ambulation/Gait assistance: Contact guard assist Gait Distance (Feet): 10 Feet Assistive device: Rolling walker (2 wheels) Gait Pattern/deviations: Decreased step length - right, Decreased step length - left, Decreased stride length Gait velocity: Dec     General Gait Details: Pt completes forward and backwards stepping with RW in room, CGA for safety. Slow/labored Microbiologist Bed    Modified Rankin (Stroke Patients Only)       Balance Overall balance assessment: Needs assistance Sitting-balance support: Feet supported, Bilateral upper extremity supported Sitting balance-Leahy Scale: Good Sitting balance - Comments: Seated EOB   Standing balance support: Bilateral  upper extremity supported, During functional activity, Reliant on assistive device for balance Standing balance-Leahy Scale: Fair Standing balance comment: w/ RW          Communication  Communication Communication: No apparent difficulties  Cognition Arousal: Alert Behavior During Therapy: WFL for tasks assessed/performed   PT - Cognitive impairments: No apparent impairments       Following commands: Intact      Cueing Cueing Techniques: Verbal cues  Exercises Other Exercises Other Exercises: Sit to Stands, AROM, Strengthening, Both, 5 reps, CGA    General Comments        Pertinent Vitals/Pain Pain Assessment Pain Assessment: Faces Faces Pain Scale: Hurts little more Pain Location: R knee pain when WB Pain Descriptors / Indicators: Discomfort Pain Intervention(s): Limited activity within patient's tolerance, Monitored during session, Repositioned    Home Living                          Prior Function            PT Goals (current goals can now be found in the care plan section) Acute Rehab PT Goals Patient Stated Goal: Return home with HHPT services and bariatric RW PT Goal Formulation: With patient Time For Goal Achievement: 05/23/24 Potential to Achieve Goals: Good Progress towards PT goals: Progressing toward goals    Frequency    Min 3X/week      PT Plan      Co-evaluation              AM-PAC PT 6 Clicks Mobility   Outcome Measure  Help needed turning from your back to your side while in a flat bed without using bedrails?: A Lot Help needed moving from lying on your back to sitting on the side of a flat bed without using bedrails?: A Lot Help needed moving to and from a bed to a chair (including a wheelchair)?: A Little Help needed standing up from a chair using your arms (e.g., wheelchair or bedside chair)?: A Little Help needed to walk in hospital room?: A Little Help needed climbing 3-5 steps with a railing? : A Lot 6 Click Score: 15    End of Session Equipment Utilized During Treatment: Gait belt Activity Tolerance: Patient tolerated treatment well Patient left: in chair;with call bell/phone within  reach;with chair alarm set   PT Visit Diagnosis: Difficulty in walking, not elsewhere classified (R26.2);Other abnormalities of gait and mobility (R26.89);Pain;Muscle weakness (generalized) (M62.81);History of falling (Z91.81) Pain - part of body: Knee     Time: 1610-9604 PT Time Calculation (min) (ACUTE ONLY): 21 min  Charges:    $Therapeutic Exercise: 8-22 mins PT General Charges $$ ACUTE PT VISIT: 1 Visit                    11:36 AM, 05/23/24 Marysue Sola, PT, DPT [place] with [facility]

## 2024-05-24 DIAGNOSIS — N178 Other acute kidney failure: Secondary | ICD-10-CM | POA: Diagnosis not present

## 2024-05-24 DIAGNOSIS — I5023 Acute on chronic systolic (congestive) heart failure: Secondary | ICD-10-CM | POA: Diagnosis not present

## 2024-05-24 DIAGNOSIS — I82401 Acute embolism and thrombosis of unspecified deep veins of right lower extremity: Secondary | ICD-10-CM | POA: Diagnosis not present

## 2024-05-24 DIAGNOSIS — N1832 Chronic kidney disease, stage 3b: Secondary | ICD-10-CM | POA: Diagnosis not present

## 2024-05-24 LAB — BASIC METABOLIC PANEL WITH GFR
Anion gap: 7 (ref 5–15)
BUN: 52 mg/dL — ABNORMAL HIGH (ref 8–23)
CO2: 29 mmol/L (ref 22–32)
Calcium: 9.1 mg/dL (ref 8.9–10.3)
Chloride: 101 mmol/L (ref 98–111)
Creatinine, Ser: 2.1 mg/dL — ABNORMAL HIGH (ref 0.44–1.00)
GFR, Estimated: 26 mL/min — ABNORMAL LOW (ref >60)
Glucose, Bld: 166 mg/dL — ABNORMAL HIGH (ref 70–99)
Potassium: 5 mmol/L (ref 3.5–5.1)
Sodium: 137 mmol/L (ref 135–145)

## 2024-05-24 LAB — GLUCOSE, CAPILLARY
Glucose-Capillary: 142 mg/dL — ABNORMAL HIGH (ref 70–99)
Glucose-Capillary: 148 mg/dL — ABNORMAL HIGH (ref 70–99)
Glucose-Capillary: 135 mg/dL — ABNORMAL HIGH (ref 70–99)
Glucose-Capillary: 165 mg/dL — ABNORMAL HIGH (ref 70–99)

## 2024-05-24 LAB — MAGNESIUM: Magnesium: 2.3 mg/dL (ref 1.7–2.4)

## 2024-05-24 NOTE — Plan of Care (Signed)
   Problem: Education: Goal: Knowledge of General Education information will improve Description: Including pain rating scale, medication(s)/side effects and non-pharmacologic comfort measures Outcome: Progressing   Problem: Clinical Measurements: Goal: Will remain free from infection Outcome: Progressing   Problem: Coping: Goal: Level of anxiety will decrease Outcome: Progressing

## 2024-05-24 NOTE — Progress Notes (Signed)
 PROGRESS NOTE  Maureen Jackson ZOX:[id number] DOB: 1956/08/01 DOA: 05/16/2024 PCP: Veda Gerald, MD  Brief History:  68 y.o. female,  with HFrEF 2/2 ischemic cardiomyopathy, hypothyroidism, CAD s/p PCI to the LAD and RCA , PAD, HTN, T2DM, OSA not on CPAP, CKD and morbid obesity/nonischemic cardiomyopathy with a EF 35%, history of pericardial effusion status post pericardial window.  Patient presents to ED secondary to complaints of shortness of breath, she denies any chest pain, fever, chills, reports shortness of breath progressive over the last week, but significant today which prompted her to come to ED, patient report she has not been taking her Lasix .  In ED chest x-ray significant for enlarged cardiac silhouette, possible pulmonary edema, she has elevated BNP, bedside ultrasound by ED physician with finding of small pericardial effusion, she was started on IV lasix , ED discussed with cardiology at [facility], who recommended admission to [facility] and to obtain echo and to diurese.   Assessment/Plan: Acute on chronic HFrEF -pt admits she is not taking lasix  at home because it causes frequent urination and she is mostly immobile.    -she was started on IV Lasix  60 mg IV twice daily but now held due to bump in creatinine, following daily weights, strict ins and out -05/17/24 2D echo: no significant pericardial effusion, LVEF remains 30-35% with global hypokinesis, grade 1 DD, aortic valve sclerosis with no aortic valve stenosis. -repeated CXR shows modest improvement in interstitial edema -6/8 holding diuretics due to bump in creatinine -05/22/24--redose lasix  IV -05/23/24--hold IV lasix  -05/24/24--po lasix  -hold potassium supplementation -recheck BMP in AM  -pt reports dry weight is about 340#  -encouraged sodium restriction 2 gm/day and fluid restriction -appreciate cardiology   History of pericardial effusion s/p window - History of pericardial effusion, failed  pericardiocentesis; s/p window in 12/2022 - Chest x-ray with increased cardiac silhouette, but bedside echo by ED physician with no evidence of large pleural effusion, as well she is status post pericardial window which should prevent recurrence of effusion - follow up repeat 2D echo with report of small pericardial effusion present - outpatient providers felt to be secondary to hypothyroidism which remains uncontrolled as evidenced by TSH 135.5 and free T4<0.25.  - pt endorses that she was not taking synthroid  regularly   DVT right post tibial vein Right leg pain  - suspect precipitated by prolonged immobility  - pharm D dosed apixaban  started 05/17/24  - appreciate cardiology team recs, for length of DVT treatment, take aspirin  81 mg and hold plavix  - resume plavix  and stop aspirin  after completing 3 months treatment for DVT - counseled patient regarding high bleeding risk    CAD - outpatient cardiology records indicate she had LHC in 2018 demonstrating severe multivessel coronary artery disease. Patient underwent PCI as opposed to CABG with DES to the LAD, x 2 to the RCA and left circumflex disease managed medically.  - continue Lipitor , Toprol , holding plavix  during DVT treatment and taking aspirin  81 mg daily - She denies any chest pain     AKI on CKD stage 3b - baseline creatiine 1.2-1.5 -holding diuretics 6/8 to 6/12 -redosed IV lasix  6/12 -creatine back up to 2.10 on 6/14 - creatinine peaked 2.33   Morbid obesity -Body mass index is 56.25 kg/m. -pt reports she has been on Ozempic since 2023    Chronic pain  - ordered a bariatric bed - ordered PRN meds for pain  Hypertension - Continue with metoprolol  succinate, Spiro, Entresto    Diabetes mellitus, type II -She is on Jardiance  and Ozempic at home, resumed Jardiance , Ozempic is nonformulary, continue insulin  sliding scale   Hypothyroidism, post thyroidectomy - uncontrolled as evidenced by TSH 135  - checked free  T4---<0.25  - Initially insistent that she takes levothyroxine  daily -  - later endorsed she was not very compliant - compliance is a concern as she tells me she does not take it regularly, not on empty stomach and takes it with her other pills when she does manage to take it. I counseled her at length about why she is not feeling better and the need to take the medication correctly.  - she says she is followed by endocrinology and she is on 250 mcg of levothyroxine  daily which is the correct dose based on her weight of 256 mcg/day and she says she has been on this dose for longer than 1 year - last visit with endocrinology was in May 2023: she was counseled regarding the correct intake of her thyroid  hormone, on empty stomach at fasting, with water, separated by at least 30 minutes from breakfast and other medications,  and separated by more than 4 hours from calcium , iron , multivitamins, acid reflux medications (PPIs).    Deconditioning - consulted PT, OT - up to chair, OOB each shift - pt rrefuse SNF - she reports that she lives alone.     Family Communication: no  Family at bedside   Consultants:  cardiology   Code Status:  FULL   DVT Prophylaxis: apixaban      Procedures: As Listed in Progress Note Above   Antibiotics: None        Subjective: Patient denies fevers, chills, headache, chest pain, dyspnea, nausea, vomiting, diarrhea, abdominal pain, dysuria, hematuria, hematochezia, and melena.   Objective: Vitals:   05/23/24 2018 05/24/24 0500 05/24/24 0611 05/24/24 1407  BP: 115/67  127/74 108/78  Pulse: 64  67 70  Resp: 16  17 18   Temp: 98 F (36.7 C)  98.1 F (36.7 C) 98.2 F (36.8 C)  TempSrc: Oral  Oral   SpO2: 100%  97% 97%  Weight:  (!) 159.2 kg    Height:        Intake/Output Summary (Last 24 hours) at 05/24/2024 1629 Last data filed at 05/24/2024 1300 Gross per 24 hour  Intake 900 ml  Output 1450 ml  Net -550 ml   Weight change: -3.1  kg Exam:  General:  Pt is alert, follows commands appropriately, not in acute distress HEENT: No icterus, No thrush, No neck mass, [place]/AT Cardiovascular: RRR, S1/S2, no rubs, no gallops Respiratory: fine bibasilar crackles. No wheeze Abdomen: Soft/+BS, non tender, non distended, no guarding Extremities: No edema, No lymphangitis, No petechiae, No rashes, no synovitis   Data Reviewed: I have personally reviewed following labs and imaging studies Basic Metabolic Panel: Recent Labs  Lab 05/19/24 0606 05/19/24 1200 05/20/24 0428 05/21/24 0440 05/22/24 0447 05/23/24 0447 05/24/24 0544  NA 141   < > 141 138 140 141 137  K 4.7   < > 4.8 4.5 4.6 4.7 5.0  CL 106   < > 105 104 106 103 101  CO2 24   < > 29 27 27 30 29   GLUCOSE 136*   < > 128* 138* 152* 108* 166*  BUN 35*   < > 36* 35* 37* 42* 52*  CREATININE 2.32*   < > 1.99* 1.74* 1.68* 1.79* 2.10*  CALCIUM  8.1*   < > 8.3* 8.6* 8.8* 8.6* 9.1  MG 2.1  --   --  2.3  --  2.2 2.3   < > = values in this interval not displayed.   Liver Function Tests: No results for input(s): AST, ALT, ALKPHOS, BILITOT, PROT, ALBUMIN  in the last 168 hours. No results for input(s): LIPASE, AMYLASE in the last 168 hours. No results for input(s): AMMONIA in the last 168 hours. Coagulation Profile: No results for input(s): INR, PROTIME in the last 168 hours. CBC: Recent Labs  Lab 05/19/24 0606 05/20/24 0428 05/21/24 0440 05/22/24 0447  WBC 6.8 7.0 7.2 6.8  HGB 10.6* 10.8* 10.5* 10.3*  HCT 34.8* 35.7* 34.4* 34.0*  MCV 91.6 90.8 90.5 89.5  PLT 217 225 224 197   Cardiac Enzymes: No results for input(s): CKTOTAL, CKMB, CKMBINDEX, TROPONINI in the last 168 hours. BNP: Invalid input(s): POCBNP CBG: Recent Labs  Lab 05/23/24 1116 05/23/24 1618 05/24/24 0731 05/24/24 1140 05/24/24 1626  GLUCAP 127* 129* 142* 148* 135*   HbA1C: No results for input(s): HGBA1C in the last 72 hours. Urine analysis:    Component  Value Date/Time   COLORURINE YELLOW 12/31/2022 1745   APPEARANCEUR HAZY (A) 12/31/2022 1745   LABSPEC 1.013 12/31/2022 1745   PHURINE 5.0 12/31/2022 1745   GLUCOSEU >=500 (A) 12/31/2022 1745   HGBUR NEGATIVE 12/31/2022 1745   BILIRUBINUR NEGATIVE 12/31/2022 1745   KETONESUR NEGATIVE 12/31/2022 1745   PROTEINUR NEGATIVE 12/31/2022 1745   UROBILINOGEN 0.2 10/02/2015 2320   NITRITE NEGATIVE 12/31/2022 1745   LEUKOCYTESUR NEGATIVE 12/31/2022 1745   Sepsis Labs: @LABRCNTIP (procalcitonin:4,lacticidven:4) ) Recent Results (from the past 240 hours)  Resp panel by RT-PCR (RSV, Flu A&B, Covid) Anterior Nasal Swab     Status: None   Collection Time: 05/16/24  3:10 PM   Specimen: Anterior Nasal Swab  Result Value Ref Range Status   SARS Coronavirus 2 by RT PCR NEGATIVE NEGATIVE Final    Comment: (NOTE) SARS-CoV-2 target nucleic acids are NOT DETECTED.  The SARS-CoV-2 RNA is generally detectable in upper respiratory specimens during the acute phase of infection. The lowest concentration of SARS-CoV-2 viral copies this assay can detect is 138 copies/mL. A negative result does not preclude SARS-Cov-2 infection and should not be used as the sole basis for treatment or other patient management decisions. A negative result may occur with  improper specimen collection/handling, submission of specimen other than nasopharyngeal swab, presence of viral mutation(s) within the areas targeted by this assay, and inadequate number of viral copies(<138 copies/mL). A negative result must be combined with clinical observations, patient history, and epidemiological information. The expected result is Negative.  Fact Sheet for Patients:  BloggerCourse.com  Fact Sheet for Healthcare Providers:  SeriousBroker.it  This test is no t yet approved or cleared by the [place]  FDA and  has been authorized for detection and/or diagnosis of SARS-CoV-2 by FDA  under an Emergency Use Authorization (EUA). This EUA will remain  in effect (meaning this test can be used) for the duration of the COVID-19 declaration under Section 564(b)(1) of the Act, 21 U.S.C.section 360bbb-3(b)(1), unless the authorization is terminated  or revoked sooner.       Influenza A by PCR NEGATIVE NEGATIVE Final   Influenza B by PCR NEGATIVE NEGATIVE Final    Comment: (NOTE) The Xpert Xpress SARS-CoV-2/FLU/RSV plus assay is intended as an aid in the diagnosis of influenza from Nasopharyngeal swab specimens and should not be used as a sole basis  for treatment. Nasal washings and aspirates are unacceptable for Xpert Xpress SARS-CoV-2/FLU/RSV testing.  Fact Sheet for Patients: BloggerCourse.com  Fact Sheet for Healthcare Providers: SeriousBroker.it  This test is not yet approved or cleared by the [place]  FDA and has been authorized for detection and/or diagnosis of SARS-CoV-2 by FDA under an Emergency Use Authorization (EUA). This EUA will remain in effect (meaning this test can be used) for the duration of the COVID-19 declaration under Section 564(b)(1) of the Act, 21 U.S.C. section 360bbb-3(b)(1), unless the authorization is terminated or revoked.     Resp Syncytial Virus by PCR NEGATIVE NEGATIVE Final    Comment: (NOTE) Fact Sheet for Patients: BloggerCourse.com  Fact Sheet for Healthcare Providers: SeriousBroker.it  This test is not yet approved or cleared by the [place]  FDA and has been authorized for detection and/or diagnosis of SARS-CoV-2 by FDA under an Emergency Use Authorization (EUA). This EUA will remain in effect (meaning this test can be used) for the duration of the COVID-19 declaration under Section 564(b)(1) of the Act, 21 U.S.C. section 360bbb-3(b)(1), unless the authorization is terminated or revoked.  Performed at [facility], [address] [place], [place] [address]      Scheduled Meds:  acetaminophen   1,000 mg Oral Q8H   apixaban   5 mg Oral BID   aspirin  EC  81 mg Oral Daily   atorvastatin   20 mg Oral Daily   brimonidine   1 drop Both Eyes BID   And   timolol   1 drop Both Eyes BID   calcium  carbonate  4 tablet Oral BID WC   empagliflozin   10 mg Oral Daily   insulin  aspart  0-15 Units Subcutaneous TID WC   levothyroxine   250 mcg Oral QAC breakfast   metoprolol  succinate  25 mg Oral Daily   sacubitril -valsartan   1 tablet Oral BID   spironolactone   12.5 mg Oral Daily   Continuous Infusions:  Procedures/Studies: DG CHEST PORT 1 VIEW Result Date: 05/19/2024 CLINICAL DATA:  Pulmonary edema EXAM: PORTABLE CHEST 1 VIEW COMPARISON:  Chest x-ray performed May 16, 2024 FINDINGS: Modest improvement in interstitial edema. Enlarged heart. Limited visualization of the retrocardiac region. IMPRESSION: 1. Modest improvement in interstitial edema. 2. Enlarged heart. Electronically Signed   By: Reagan Camera M.D.   On: 05/19/2024 08:25   ECHOCARDIOGRAM COMPLETE Result Date: 05/17/2024    ECHOCARDIOGRAM REPORT   Patient Name:   LILIYA FULLENWIDER Date of Exam: 05/17/2024 Medical Rec #:  [id number]       Height:       65.0 in Accession #:    [id number]      Weight:       351.6 lb Date of Birth:  1956-01-29        BSA:          2.514 m Patient Age:    68 years        BP:           128/92 mmHg Patient Gender: F               HR:           79 bpm. Exam Location:  [facility] Procedure: 2D Echo, Cardiac Doppler, Color Doppler and Intracardiac            Opacification Agent (Both Spectral and Color Flow Doppler were            utilized during procedure). Indications:    Pericardial effusion I31.3  History:  Patient has prior history of Echocardiogram examinations, most                 recent 08/20/2023. CHF and Cardiomyopathy, CAD, TIA; Risk                 Factors:Hypertension, Diabetes, Dyslipidemia and Sleep Apnea.                  Morbid Obesity.  Sonographer:    Denese Finn RCS Referring Phys: [id number] [place]  Sonographer Comments: Technically difficult study due to poor echo windows and suboptimal subcostal window. IMPRESSIONS  1. Left ventricular ejection fraction, by estimation, is 30 to 35%. The left ventricle has moderately decreased function. The left ventricle demonstrates global hypokinesis. The left ventricular internal cavity size was moderately dilated. There is moderate left ventricular hypertrophy. Left ventricular diastolic parameters are consistent with Grade I diastolic dysfunction (impaired relaxation).  2. Right ventricular systolic function is normal. The right ventricular size is normal.  3. Left atrial size was mildly dilated.  4. A small pericardial effusion is present. The pericardial effusion is posterior to the left ventricle and anterior to the right ventricle.  5. The mitral valve is abnormal. No evidence of mitral valve regurgitation. No evidence of mitral stenosis. Moderate mitral annular calcification.  6. The aortic valve is tricuspid. There is mild calcification of the aortic valve. There is mild thickening of the aortic valve. Aortic valve regurgitation is not visualized. Aortic valve sclerosis is present, with no evidence of aortic valve stenosis.  7. The inferior vena cava is normal in size with greater than 50% respiratory variability, suggesting right atrial pressure of 3 mmHg. FINDINGS  Left Ventricle: Left ventricular ejection fraction, by estimation, is 30 to 35%. The left ventricle has moderately decreased function. The left ventricle demonstrates global hypokinesis. Definity  contrast agent was given IV to delineate the left ventricular endocardial borders. Strain was performed and the global longitudinal strain is indeterminate. The left ventricular internal cavity size was moderately dilated. There is moderate left ventricular hypertrophy. Left ventricular diastolic parameters are  consistent with Grade I diastolic dysfunction (impaired relaxation). Right Ventricle: The right ventricular size is normal. No increase in right ventricular wall thickness. Right ventricular systolic function is normal. Left Atrium: Left atrial size was mildly dilated. Right Atrium: Right atrial size was normal in size. Pericardium: A small pericardial effusion is present. The pericardial effusion is posterior to the left ventricle and anterior to the right ventricle. Mitral Valve: The mitral valve is abnormal. There is mild thickening of the mitral valve leaflet(s). There is mild calcification of the mitral valve leaflet(s). Moderate mitral annular calcification. No evidence of mitral valve regurgitation. No evidence  of mitral valve stenosis. Tricuspid Valve: The tricuspid valve is normal in structure. Tricuspid valve regurgitation is not demonstrated. No evidence of tricuspid stenosis. Aortic Valve: The aortic valve is tricuspid. There is mild calcification of the aortic valve. There is mild thickening of the aortic valve. Aortic valve regurgitation is not visualized. Aortic valve sclerosis is present, with no evidence of aortic valve stenosis. Pulmonic Valve: The pulmonic valve was normal in structure. Pulmonic valve regurgitation is trivial. No evidence of pulmonic stenosis. Aorta: The aortic root is normal in size and structure. Venous: The inferior vena cava is normal in size with greater than 50% respiratory variability, suggesting right atrial pressure of 3 mmHg. IAS/Shunts: No atrial level shunt detected by color flow Doppler. Additional Comments: 3D was performed not requiring image post processing  on an independent workstation and was indeterminate.  LEFT VENTRICLE PLAX 2D LVIDd:         6.00 cm   Diastology LVIDs:         4.60 cm   LV e' medial:   3.70 cm/s LV PW:         1.50 cm   LV E/e' medial: 13.0 LV IVS:        1.40 cm LVOT diam:     1.80 cm LV SV:         25 LV SV Index:   10 LVOT Area:     2.54  cm  RIGHT VENTRICLE RV S prime:     5.96 cm/s LEFT ATRIUM             Index LA diam:        4.10 cm 1.63 cm/m LA Vol (A2C):   80.4 ml 31.98 ml/m LA Vol (A4C):   76.1 ml 30.27 ml/m LA Biplane Vol: 78.8 ml 31.34 ml/m  AORTIC VALVE LVOT Vmax:   65.57 cm/s LVOT Vmean:  45.700 cm/s LVOT VTI:    0.097 m  AORTA Ao Root diam: 3.50 cm MITRAL VALVE MV Area (PHT): 2.81 cm    SHUNTS MV Decel Time: 270 msec    Systemic VTI:  0.10 m MV E velocity: 48.05 cm/s  Systemic Diam: 1.80 cm MV A velocity: 68.55 cm/s MV E/A ratio:  0.70 Janelle Mediate MD Electronically signed by Janelle Mediate MD Signature Date/Time: 05/17/2024/3:24:41 PM    Final    US  Venous Img Lower Bilateral (DVT) Result Date: 05/17/2024 CLINICAL DATA:  Lower extremity pain. EXAM: BILATERAL LOWER EXTREMITY VENOUS DOPPLER ULTRASOUND TECHNIQUE: Gray-scale sonography with graded compression, as well as color Doppler and duplex ultrasound were performed to evaluate the lower extremity deep venous systems from the level of the common femoral vein and including the common femoral, femoral, profunda femoral, popliteal and calf veins including the posterior tibial, peroneal and gastrocnemius veins when visible. The superficial great saphenous vein was also interrogated. Spectral Doppler was utilized to evaluate flow at rest and with distal augmentation maneuvers in the common femoral, femoral and popliteal veins. COMPARISON:  None Available. FINDINGS: RIGHT LOWER EXTREMITY Common Femoral Vein: No evidence of thrombus. Normal compressibility, respiratory phasicity and response to augmentation. Saphenofemoral Junction: No evidence of thrombus. Normal compressibility and flow on color Doppler imaging. Profunda Femoral Vein: No evidence of thrombus. Normal compressibility and flow on color Doppler imaging. Femoral Vein: No evidence of thrombus. Normal compressibility, respiratory phasicity and response to augmentation. Popliteal Vein: No evidence of thrombus. Normal  compressibility, respiratory phasicity and response to augmentation. Calf Veins: Absent color Doppler flow within the noncompressible right posterior tibial vein noted. Superficial Great Saphenous Vein: No evidence of thrombus. Normal compressibility. Venous Reflux:  None. Other Findings:  None. LEFT LOWER EXTREMITY Common Femoral Vein: No evidence of thrombus. Normal compressibility, respiratory phasicity and response to augmentation. Saphenofemoral Junction: No evidence of thrombus. Normal compressibility and flow on color Doppler imaging. Profunda Femoral Vein: No evidence of thrombus. Normal compressibility and flow on color Doppler imaging. Femoral Vein: No evidence of thrombus. Normal compressibility, respiratory phasicity and response to augmentation. Popliteal Vein: No evidence of thrombus. Normal compressibility, respiratory phasicity and response to augmentation. Calf Veins: No evidence of thrombus. Normal compressibility and flow on color Doppler imaging. Superficial Great Saphenous Vein: No evidence of thrombus. Normal compressibility. Venous Reflux:  None. Other Findings:  None. IMPRESSION: 1. Positive for deep vein thrombosis involving  the right posterior tibial vein. 2. No evidence for deep vein thrombosis within the left lower extremity. These results will be called to the ordering clinician or representative by the Radiologist Assistant, and communication documented in the PACS or [employer]. Electronically Signed   By: Kimberley Penman M.D.   On: 05/17/2024 11:15   DG Chest Port 1 View Result Date: 05/16/2024 CLINICAL DATA:  sob EXAM: PORTABLE CHEST - 1 VIEW COMPARISON:  October 08, 2023 FINDINGS: Diffuse bilateral perihilar interstitial opacities. Worsening enlargement of the cardiac silhouette. No acute fracture or destructive lesions. Multilevel thoracic osteophytosis. IMPRESSION: 1. Diffuse bilateral perihilar interstitial opacities, likely reflecting interstitial edema. 2. Worsening  enlargement of the cardiac silhouette, possibly from progressive cardiomegaly or superimposed pericardial effusion. Electronically Signed   By: Rance Burrows M.D.   On: 05/16/2024 15:52    Demaris Fillers, DO  [facility]  If 7PM-7AM, please contact night-coverage [email or web address] Password [email or web address] 05/24/2024, 4:29 PM   LOS: 4 days

## 2024-05-24 NOTE — TOC Transition Note (Addendum)
 Transition of Care [facility]) - Discharge Note   Patient Details  Name: Maureen Jackson MRN: [id number] Date of Birth: Jun 21, 1956  Transition of Care [facility]) CM/SW Contact:  Orelia Binet, RN Phone Number: 05/24/2024, 11:51 AM   Clinical Narrative:   Patient discharging home today with [facility] home health tomorrow. Orders placed, Cory updated.     Final next level of care: Home w Home Health Services Barriers to Discharge: Barriers Resolved   Patient Goals and CMS Choice Patient states their goals for this hospitalization and ongoing recovery are:: return home CMS Medicare.gov Compare Post Acute Care list provided to:: Patient       Discharge Placement     Patient and family notified of of transfer: 05/24/24  Discharge Plan and Services Additional resources added to the After Visit Summary for   In-house Referral: Clinical Social Work              DME Arranged: Otho Blitz wide DME Agency: [employer] Date DME Agency Contacted: 05/19/24 Time DME Agency Contacted: 1030 Representative spoke with at DME Agency: Gladys Lamp HH Arranged: PT HH Agency: [facility] Date [facility] Agency Contacted: 05/19/24 Time HH Agency Contacted: 1030 Representative spoke with at [facility] Agency: Randel Buss  Social Drivers of Health (SDOH) Interventions SDOH Screenings   Food Insecurity: No Food Insecurity (05/19/2024)  Housing: Low Risk  (05/19/2024)  Transportation Needs: Unmet Transportation Needs (05/19/2024)  Utilities: Not At Risk (05/16/2024)  Alcohol Screen: Low Risk  (12/22/2022)  Depression (PHQ2-9): Low Risk  (01/01/2020)  Financial Resource Strain: Low Risk  (05/19/2024)  Social Connections: Unknown (05/16/2024)  Tobacco Use: Medium Risk (05/19/2024)     Readmission Risk Interventions    05/22/2024    2:03 PM 05/22/2024   12:06 PM 05/21/2024    8:50 AM  Readmission Risk Prevention Plan  Transportation Screening Complete Complete Complete  Home Care Screening Complete Complete Complete  Medication  Review (RN CM) Complete Complete Complete

## 2024-05-25 DIAGNOSIS — N178 Other acute kidney failure: Secondary | ICD-10-CM | POA: Diagnosis not present

## 2024-05-25 DIAGNOSIS — I3139 Other pericardial effusion (noninflammatory): Secondary | ICD-10-CM | POA: Diagnosis not present

## 2024-05-25 DIAGNOSIS — I82401 Acute embolism and thrombosis of unspecified deep veins of right lower extremity: Secondary | ICD-10-CM | POA: Diagnosis not present

## 2024-05-25 DIAGNOSIS — I5023 Acute on chronic systolic (congestive) heart failure: Secondary | ICD-10-CM | POA: Diagnosis not present

## 2024-05-25 LAB — GLUCOSE, CAPILLARY
Glucose-Capillary: 147 mg/dL — ABNORMAL HIGH (ref 70–99)
Glucose-Capillary: 148 mg/dL — ABNORMAL HIGH (ref 70–99)

## 2024-05-25 LAB — BASIC METABOLIC PANEL WITH GFR
Anion gap: 10 (ref 5–15)
BUN: 49 mg/dL — ABNORMAL HIGH (ref 8–23)
CO2: 25 mmol/L (ref 22–32)
Calcium: 8.9 mg/dL (ref 8.9–10.3)
Chloride: 103 mmol/L (ref 98–111)
Creatinine, Ser: 1.67 mg/dL — ABNORMAL HIGH (ref 0.44–1.00)
GFR, Estimated: 34 mL/min — ABNORMAL LOW (ref >60)
Glucose, Bld: 141 mg/dL — ABNORMAL HIGH (ref 70–99)
Potassium: 4.3 mmol/L (ref 3.5–5.1)
Sodium: 138 mmol/L (ref 135–145)

## 2024-05-25 LAB — MAGNESIUM: Magnesium: 2.4 mg/dL (ref 1.7–2.4)

## 2024-05-25 MED ORDER — FUROSEMIDE 40 MG PO TABS
60.0000 mg | ORAL_TABLET | Freq: Every day | ORAL | Status: DC
  Filled 2024-05-25: qty 1

## 2024-05-25 MED ORDER — APIXABAN 5 MG PO TABS: 5.0000 mg | ORAL_TABLET | Freq: Two times a day (BID) | ORAL | 1 refills | Status: DC

## 2024-05-25 MED ORDER — SACUBITRIL-VALSARTAN 49-51 MG PO TABS: 1.0000 | ORAL_TABLET | Freq: Two times a day (BID) | ORAL | 1 refills | Status: DC

## 2024-05-25 MED ORDER — SPIRONOLACTONE 25 MG PO TABS: 12.5000 mg | ORAL_TABLET | Freq: Every day | ORAL | Status: DC

## 2024-05-25 MED ORDER — ASPIRIN 81 MG PO TBEC: 81.0000 mg | DELAYED_RELEASE_TABLET | Freq: Every day | ORAL | Status: DC

## 2024-05-25 MED ORDER — EMPAGLIFLOZIN 10 MG PO TABS: 10.0000 mg | ORAL_TABLET | Freq: Every day | ORAL | 1 refills | Status: DC

## 2024-05-25 NOTE — Plan of Care (Signed)

## 2024-05-25 NOTE — Discharge Summary (Addendum)
 Physician Discharge Summary   Patient: Maureen Jackson MRN: [id number] DOB: February 04, 1956  Admit date:     05/16/2024  Discharge date: 05/25/24  Discharge Physician: Myrtie Atkinson Linley Moxley   PCP: Veda Gerald, MD   Recommendations at discharge:   Please follow up with primary care provider within 1-2 weeks  Please repeat BMP and CBC in one week    Hospital Course: 68 y.o. female,  with HFrEF 2/2 ischemic cardiomyopathy, hypothyroidism, CAD s/p PCI to the LAD and RCA , PAD, HTN, T2DM, OSA not on CPAP, CKD and morbid obesity/nonischemic cardiomyopathy with a EF 35%, history of pericardial effusion status post pericardial window.  Patient presents to ED secondary to complaints of shortness of breath, she denies any chest pain, fever, chills, reports shortness of breath progressive over the last week, but significant today which prompted her to come to ED, patient report she has not been taking her Lasix .  In ED chest x-ray significant for enlarged cardiac silhouette, possible pulmonary edema, she has elevated BNP, bedside ultrasound by ED physician with finding of small pericardial effusion, she was started on IV lasix , ED discussed with cardiology at [facility], who recommended admission to [facility] and to obtain echo and to diurese. The patient was started on IV furosemide  with slow clinical improvement.  Cardiology was consulted to assist with management.  Her serum creatinine rose significantly.  She was given furosemide  IV holiday with improvement of her renal function.  Her hospitalization was prolonged secondary to her fluctuating renal function.  Fortunately, the patient's fluid status remained stable and continued to improve gradually.  She was placed on apixaban  for her right lower extremity DVT.  Her Plavix  was discontinued temporarily until she finishes her 57-month course of apixaban  at which time she will restart her Plavix  with her aspirin . She was ultimately transitioned to oral furosemide .   Her renal function was monitored while on oral furosemide  and remained overall stable.  Therefore, she was discharged in stable condition with outpatient follow-up with cardiology.  Assessment and Plan:  Acute on chronic HFrEF -pt admits she is not taking lasix  at home because it causes frequent urination and she is mostly immobile.    -she was started on IV Lasix  60 mg IV twice daily but now held due to bump in creatinine, following daily weights, strict ins and out -05/17/24 2D echo: no significant pericardial effusion, LVEF remains 30-35% with global hypokinesis, grade 1 DD, aortic valve sclerosis with no aortic valve stenosis. -repeated CXR shows modest improvement in interstitial edema -6/8 holding diuretics due to bump in creatinine -05/22/24--redose lasix  IV -05/23/24--hold IV lasix  -05/24/24--po lasix  -hold potassium supplementation>>K remains >4 -pt reports dry weight is about 340#  -encouraged sodium restriction 2 gm/day and fluid restriction -appreciate cardiology -d/c home with lasix  60 mg po daily   History of pericardial effusion s/p window - History of pericardial effusion, failed pericardiocentesis; s/p window in 12/2022 - Chest x-ray with increased cardiac silhouette, but bedside echo by ED physician with no evidence of large pleural effusion, as well she is status post pericardial window which should prevent recurrence of effusion - follow up repeat 2D echo with report of small pericardial effusion present - outpatient providers felt to be secondary to hypothyroidism which remains uncontrolled as evidenced by TSH 135.5 and free T4<0.25.  - pt endorses that she was not taking synthroid  regularly   DVT right post tibial vein Right leg pain  - suspect precipitated by prolonged immobility  - pharm  D dosed apixaban  started 05/17/24  - appreciate cardiology team recs, for length of DVT treatment, take aspirin  81 mg and hold plavix  - resume plavix  and stop aspirin  after completing 3  months treatment for DVT - counseled patient regarding high bleeding risk    CAD - outpatient cardiology records indicate she had LHC in 2018 demonstrating severe multivessel coronary artery disease. Patient underwent PCI as opposed to CABG with DES to the LAD, x 2 to the RCA and left circumflex disease managed medically.  - continue Lipitor , Toprol , holding plavix  during DVT treatment and taking aspirin  81 mg daily - She denies any chest pain     AKI on CKD stage 3b - baseline creatiine 1.2-1.5 -holding diuretics 6/8 to 6/12 -redosed IV lasix  6/12 - creatinine peaked 2.33 - serum creatinine 1.67 on day of d/c   Morbid obesity -Body mass index is 56.25 kg/m. -pt reports she has been on Ozempic since 2023    Chronic pain  - ordered a bariatric bed - ordered PRN meds for pain   Hypertension - Continue with metoprolol  succinate, Spiro, Entresto    Diabetes mellitus, type II -She is on Jardiance  and Ozempic at home, resumed Jardiance , Ozempic is nonformulary, continue insulin  sliding scale   Hypothyroidism, post thyroidectomy - uncontrolled as evidenced by TSH 135  - checked free T4---<0.25  - Initially insistent that she takes levothyroxine  daily -  - later endorsed she was not very compliant - compliance is a concern as she tells me she does not take it regularly, not on empty stomach and takes it with her other pills when she does manage to take it. I counseled her at length about why she is not feeling better and the need to take the medication correctly.  - she says she is followed by endocrinology and she is on 250 mcg of levothyroxine  daily which is the correct dose based on her weight of 256 mcg/day and she says she has been on this dose for longer than 1 year - last visit with endocrinology was in May 2023: she was counseled regarding the correct intake of her thyroid  hormone, on empty stomach at fasting, with water, separated by at least 30 minutes from breakfast and other  medications,  and separated by more than 4 hours from calcium , iron , multivitamins, acid reflux medications (PPIs).    Deconditioning - consulted PT, OT - up to chair, OOB each shift - pt rrefuse SNF - she reports that she lives alone.      Consultants: cardiology Procedures performed: none  Disposition: Home Diet recommendation:  Cardiac diet DISCHARGE MEDICATION: Allergies as of 05/25/2024       Reactions   Ampicillin  Other (See Comments)   Allergic, per [facility]   Aspirin  Nausea Only   Hydrocodone  Other (See Comments)   Allergic, per [facility]   Unasyn  [ampicillin -sulbactam Sodium ] Rash, Other (See Comments)   Allergic, per [facility]        Medication List     STOP taking these medications    clopidogrel  75 MG tablet Commonly known as: PLAVIX    Entresto  97-103 MG Generic drug: sacubitril -valsartan  Replaced by: sacubitril -valsartan  49-51 MG       TAKE these medications    acetaminophen  325 MG tablet Commonly known as: TYLENOL  Take 2 tablets (650 mg total) by mouth every 4 (four) hours as needed for mild pain, fever or headache.   apixaban  5 MG Tabs tablet Commonly known as: ELIQUIS  Take 1 tablet (5 mg total) by mouth 2 (two)  times daily.   aspirin  EC 81 MG tablet Take 1 tablet (81 mg total) by mouth daily. Swallow whole. Start taking on: May 26, 2024   atorvastatin  80 MG tablet Commonly known as: LIPITOR  TAKE 1 TABLET(80 MG) BY MOUTH DAILY   calcium  carbonate 500 MG chewable tablet Commonly known as: TUMS - dosed in mg elemental calcium  Chew 4 tablets by mouth 2 (two) times daily.   cetirizine  10 MG tablet Commonly known as: ZYRTEC  Take 10 mg by mouth daily as needed for allergies.   Combigan  0.2-0.5 % ophthalmic solution Generic drug: brimonidine -timolol  Place 1 drop into both eyes 2 (two) times daily.   empagliflozin  10 MG Tabs tablet Commonly known as: JARDIANCE  Take 1 tablet (10 mg total) by mouth daily. Start taking on: May 26, 2024 What  changed:  medication strength how much to take   furosemide  40 MG tablet Commonly known as: LASIX  Take 1.5 tablets (60 mg total) by mouth daily.   insulin  aspart 100 UNIT/ML injection Commonly known as: novoLOG  Inject 0-15 Units into the skin 3 (three) times daily with meals.   levothyroxine  50 MCG tablet Commonly known as: SYNTHROID  Take 50 mcg by mouth daily before breakfast. Take 1 tablet with 200mcg for a total dose of 250mcg daily   levothyroxine  200 MCG tablet Commonly known as: SYNTHROID  Take 200 mcg by mouth daily before breakfast. Take 1 tablet with 50mcg for a total dose of 250mcg daily   metoprolol  succinate 25 MG 24 hr tablet Commonly known as: TOPROL -XL TAKE 1 TABLET(25 MG) BY MOUTH DAILY   Omron 3 Series BP Monitor Devi Use as directed   OneTouch Verio test strip Generic drug: glucose blood Test glucose 4 times day.   Ozempic (0.25 or 0.5 MG/DOSE) 2 MG/3ML Sopn Generic drug: Semaglutide(0.25 or 0.5MG /DOS) Inject 0.5 mg into the skin once a week. Fridays   sacubitril -valsartan  49-51 MG Commonly known as: ENTRESTO  Take 1 tablet by mouth 2 (two) times daily. Replaces: Entresto  97-103 MG   senna-docusate 8.6-50 MG tablet Commonly known as: Senokot-S Take 2 tablets by mouth 2 (two) times daily. What changed:  when to take this reasons to take this   spironolactone  25 MG tablet Commonly known as: ALDACTONE  Take 0.5 tablets (12.5 mg total) by mouth daily. What changed: See the new instructions.   Ventolin  HFA 108 (90 Base) MCG/ACT inhaler Generic drug: albuterol  Inhale 1-2 puffs into the lungs every 6 (six) hours as needed for wheezing or shortness of breath.               Durable Medical Equipment  (From admission, onward)           Start     Ordered   05/17/24 1158  For home use only DME Walker wide  Once       Comments: Patient demo inc fear of falling and general weakness. would benefit from bariatric RW at home to reduce falls.    11:59 AM, 05/17/24 Marysue Sola, PT, DPT [place] with [facility]  Question Answer Comment  Patient needs a walker to treat with the following condition General weakness   Patient needs a walker to treat with the following condition Falls      05/17/24 1159            Follow-up Information     [facility] Follow up.   Why: HHPT will follow up to schedule first home visit.        Lasalle Pointer, NP  Follow up on 06/20/2024.   Specialty: Cardiology Why: Keep scheduled Cardiology follow-up for 06/20/2024 at 9:30 AM. Contact information: [address] [place] [address] [phone]                Discharge Exam: Cleavon Curls Weights   05/23/24 0557 05/24/24 0500 05/25/24 0500  Weight: (!) 162.3 kg (!) 159.2 kg (!) 158.6 kg   HEENT:  [place]/AT, No thrush, no icterus CV:  RRR, no rub, no S3, no S4 Lung:  CTA, no wheeze, no rhonchi Abd:  soft/+BS, NT Ext:  trace LE edema, no lymphangitis, no synovitis, no rash   Condition at discharge: stable  The results of significant diagnostics from this hospitalization (including imaging, microbiology, ancillary and laboratory) are listed below for reference.   Imaging Studies: DG CHEST PORT 1 VIEW Result Date: 05/19/2024 CLINICAL DATA:  Pulmonary edema EXAM: PORTABLE CHEST 1 VIEW COMPARISON:  Chest x-ray performed May 16, 2024 FINDINGS: Modest improvement in interstitial edema. Enlarged heart. Limited visualization of the retrocardiac region. IMPRESSION: 1. Modest improvement in interstitial edema. 2. Enlarged heart. Electronically Signed   By: Reagan Camera M.D.   On: 05/19/2024 08:25   ECHOCARDIOGRAM COMPLETE Result Date: 05/17/2024    ECHOCARDIOGRAM REPORT   Patient Name:   LOREL LEMBO Date of Exam: 05/17/2024 Medical Rec #:  [id number]       Height:       65.0 in Accession #:    [id number]      Weight:       351.6 lb Date of Birth:  12-28-1955        BSA:          2.514 m Patient Age:    68 years         BP:           128/92 mmHg Patient Gender: F               HR:           79 bpm. Exam Location:  [facility] Procedure: 2D Echo, Cardiac Doppler, Color Doppler and Intracardiac            Opacification Agent (Both Spectral and Color Flow Doppler were            utilized during procedure). Indications:    Pericardial effusion I31.3  History:        Patient has prior history of Echocardiogram examinations, most                 recent 08/20/2023. CHF and Cardiomyopathy, CAD, TIA; Risk                 Factors:Hypertension, Diabetes, Dyslipidemia and Sleep Apnea.                 Morbid Obesity.  Sonographer:    Denese Finn RCS Referring Phys: [phone] [place]  Sonographer Comments: Technically difficult study due to poor echo windows and suboptimal subcostal window. IMPRESSIONS  1. Left ventricular ejection fraction, by estimation, is 30 to 35%. The left ventricle has moderately decreased function. The left ventricle demonstrates global hypokinesis. The left ventricular internal cavity size was moderately dilated. There is moderate left ventricular hypertrophy. Left ventricular diastolic parameters are consistent with Grade I diastolic dysfunction (impaired relaxation).  2. Right ventricular systolic function is normal. The right ventricular size is normal.  3. Left atrial size was mildly dilated.  4. A small pericardial effusion is present. The pericardial  effusion is posterior to the left ventricle and anterior to the right ventricle.  5. The mitral valve is abnormal. No evidence of mitral valve regurgitation. No evidence of mitral stenosis. Moderate mitral annular calcification.  6. The aortic valve is tricuspid. There is mild calcification of the aortic valve. There is mild thickening of the aortic valve. Aortic valve regurgitation is not visualized. Aortic valve sclerosis is present, with no evidence of aortic valve stenosis.  7. The inferior vena cava is normal in size with greater than 50% respiratory  variability, suggesting right atrial pressure of 3 mmHg. FINDINGS  Left Ventricle: Left ventricular ejection fraction, by estimation, is 30 to 35%. The left ventricle has moderately decreased function. The left ventricle demonstrates global hypokinesis. Definity  contrast agent was given IV to delineate the left ventricular endocardial borders. Strain was performed and the global longitudinal strain is indeterminate. The left ventricular internal cavity size was moderately dilated. There is moderate left ventricular hypertrophy. Left ventricular diastolic parameters are consistent with Grade I diastolic dysfunction (impaired relaxation). Right Ventricle: The right ventricular size is normal. No increase in right ventricular wall thickness. Right ventricular systolic function is normal. Left Atrium: Left atrial size was mildly dilated. Right Atrium: Right atrial size was normal in size. Pericardium: A small pericardial effusion is present. The pericardial effusion is posterior to the left ventricle and anterior to the right ventricle. Mitral Valve: The mitral valve is abnormal. There is mild thickening of the mitral valve leaflet(s). There is mild calcification of the mitral valve leaflet(s). Moderate mitral annular calcification. No evidence of mitral valve regurgitation. No evidence  of mitral valve stenosis. Tricuspid Valve: The tricuspid valve is normal in structure. Tricuspid valve regurgitation is not demonstrated. No evidence of tricuspid stenosis. Aortic Valve: The aortic valve is tricuspid. There is mild calcification of the aortic valve. There is mild thickening of the aortic valve. Aortic valve regurgitation is not visualized. Aortic valve sclerosis is present, with no evidence of aortic valve stenosis. Pulmonic Valve: The pulmonic valve was normal in structure. Pulmonic valve regurgitation is trivial. No evidence of pulmonic stenosis. Aorta: The aortic root is normal in size and structure. Venous: The  inferior vena cava is normal in size with greater than 50% respiratory variability, suggesting right atrial pressure of 3 mmHg. IAS/Shunts: No atrial level shunt detected by color flow Doppler. Additional Comments: 3D was performed not requiring image post processing on an independent workstation and was indeterminate.  LEFT VENTRICLE PLAX 2D LVIDd:         6.00 cm   Diastology LVIDs:         4.60 cm   LV e' medial:   3.70 cm/s LV PW:         1.50 cm   LV E/e' medial: 13.0 LV IVS:        1.40 cm LVOT diam:     1.80 cm LV SV:         25 LV SV Index:   10 LVOT Area:     2.54 cm  RIGHT VENTRICLE RV S prime:     5.96 cm/s LEFT ATRIUM             Index LA diam:        4.10 cm 1.63 cm/m LA Vol (A2C):   80.4 ml 31.98 ml/m LA Vol (A4C):   76.1 ml 30.27 ml/m LA Biplane Vol: 78.8 ml 31.34 ml/m  AORTIC VALVE LVOT Vmax:   65.57 cm/s LVOT Vmean:  45.700 cm/s  LVOT VTI:    0.097 m  AORTA Ao Root diam: 3.50 cm MITRAL VALVE MV Area (PHT): 2.81 cm    SHUNTS MV Decel Time: 270 msec    Systemic VTI:  0.10 m MV E velocity: 48.05 cm/s  Systemic Diam: 1.80 cm MV A velocity: 68.55 cm/s MV E/A ratio:  0.70 Janelle Mediate MD Electronically signed by Janelle Mediate MD Signature Date/Time: 05/17/2024/3:24:41 PM    Final    US  Venous Img Lower Bilateral (DVT) Result Date: 05/17/2024 CLINICAL DATA:  Lower extremity pain. EXAM: BILATERAL LOWER EXTREMITY VENOUS DOPPLER ULTRASOUND TECHNIQUE: Gray-scale sonography with graded compression, as well as color Doppler and duplex ultrasound were performed to evaluate the lower extremity deep venous systems from the level of the common femoral vein and including the common femoral, femoral, profunda femoral, popliteal and calf veins including the posterior tibial, peroneal and gastrocnemius veins when visible. The superficial great saphenous vein was also interrogated. Spectral Doppler was utilized to evaluate flow at rest and with distal augmentation maneuvers in the common femoral, femoral and  popliteal veins. COMPARISON:  None Available. FINDINGS: RIGHT LOWER EXTREMITY Common Femoral Vein: No evidence of thrombus. Normal compressibility, respiratory phasicity and response to augmentation. Saphenofemoral Junction: No evidence of thrombus. Normal compressibility and flow on color Doppler imaging. Profunda Femoral Vein: No evidence of thrombus. Normal compressibility and flow on color Doppler imaging. Femoral Vein: No evidence of thrombus. Normal compressibility, respiratory phasicity and response to augmentation. Popliteal Vein: No evidence of thrombus. Normal compressibility, respiratory phasicity and response to augmentation. Calf Veins: Absent color Doppler flow within the noncompressible right posterior tibial vein noted. Superficial Great Saphenous Vein: No evidence of thrombus. Normal compressibility. Venous Reflux:  None. Other Findings:  None. LEFT LOWER EXTREMITY Common Femoral Vein: No evidence of thrombus. Normal compressibility, respiratory phasicity and response to augmentation. Saphenofemoral Junction: No evidence of thrombus. Normal compressibility and flow on color Doppler imaging. Profunda Femoral Vein: No evidence of thrombus. Normal compressibility and flow on color Doppler imaging. Femoral Vein: No evidence of thrombus. Normal compressibility, respiratory phasicity and response to augmentation. Popliteal Vein: No evidence of thrombus. Normal compressibility, respiratory phasicity and response to augmentation. Calf Veins: No evidence of thrombus. Normal compressibility and flow on color Doppler imaging. Superficial Great Saphenous Vein: No evidence of thrombus. Normal compressibility. Venous Reflux:  None. Other Findings:  None. IMPRESSION: 1. Positive for deep vein thrombosis involving the right posterior tibial vein. 2. No evidence for deep vein thrombosis within the left lower extremity. These results will be called to the ordering clinician or representative by the Radiologist  Assistant, and communication documented in the PACS or [employer]. Electronically Signed   By: Kimberley Penman M.D.   On: 05/17/2024 11:15   DG Chest Port 1 View Result Date: 05/16/2024 CLINICAL DATA:  sob EXAM: PORTABLE CHEST - 1 VIEW COMPARISON:  October 08, 2023 FINDINGS: Diffuse bilateral perihilar interstitial opacities. Worsening enlargement of the cardiac silhouette. No acute fracture or destructive lesions. Multilevel thoracic osteophytosis. IMPRESSION: 1. Diffuse bilateral perihilar interstitial opacities, likely reflecting interstitial edema. 2. Worsening enlargement of the cardiac silhouette, possibly from progressive cardiomegaly or superimposed pericardial effusion. Electronically Signed   By: Rance Burrows M.D.   On: 05/16/2024 15:52    Microbiology: Results for orders placed or performed during the hospital encounter of 05/16/24  Resp panel by RT-PCR (RSV, Flu A&B, Covid) Anterior Nasal Swab     Status: None   Collection Time: 05/16/24  3:10 PM   Specimen: Anterior  Nasal Swab  Result Value Ref Range Status   SARS Coronavirus 2 by RT PCR NEGATIVE NEGATIVE Final    Comment: (NOTE) SARS-CoV-2 target nucleic acids are NOT DETECTED.  The SARS-CoV-2 RNA is generally detectable in upper respiratory specimens during the acute phase of infection. The lowest concentration of SARS-CoV-2 viral copies this assay can detect is 138 copies/mL. A negative result does not preclude SARS-Cov-2 infection and should not be used as the sole basis for treatment or other patient management decisions. A negative result may occur with  improper specimen collection/handling, submission of specimen other than nasopharyngeal swab, presence of viral mutation(s) within the areas targeted by this assay, and inadequate number of viral copies(<138 copies/mL). A negative result must be combined with clinical observations, patient history, and epidemiological information. The expected result is  Negative.  Fact Sheet for Patients:  BloggerCourse.com  Fact Sheet for Healthcare Providers:  SeriousBroker.it  This test is no t yet approved or cleared by the [place]  FDA and  has been authorized for detection and/or diagnosis of SARS-CoV-2 by FDA under an Emergency Use Authorization (EUA). This EUA will remain  in effect (meaning this test can be used) for the duration of the COVID-19 declaration under Section 564(b)(1) of the Act, 21 U.S.C.section 360bbb-3(b)(1), unless the authorization is terminated  or revoked sooner.       Influenza A by PCR NEGATIVE NEGATIVE Final   Influenza B by PCR NEGATIVE NEGATIVE Final    Comment: (NOTE) The Xpert Xpress SARS-CoV-2/FLU/RSV plus assay is intended as an aid in the diagnosis of influenza from Nasopharyngeal swab specimens and should not be used as a sole basis for treatment. Nasal washings and aspirates are unacceptable for Xpert Xpress SARS-CoV-2/FLU/RSV testing.  Fact Sheet for Patients: BloggerCourse.com  Fact Sheet for Healthcare Providers: SeriousBroker.it  This test is not yet approved or cleared by the [place]  FDA and has been authorized for detection and/or diagnosis of SARS-CoV-2 by FDA under an Emergency Use Authorization (EUA). This EUA will remain in effect (meaning this test can be used) for the duration of the COVID-19 declaration under Section 564(b)(1) of the Act, 21 U.S.C. section 360bbb-3(b)(1), unless the authorization is terminated or revoked.     Resp Syncytial Virus by PCR NEGATIVE NEGATIVE Final    Comment: (NOTE) Fact Sheet for Patients: BloggerCourse.com  Fact Sheet for Healthcare Providers: SeriousBroker.it  This test is not yet approved or cleared by the [place]  FDA and has been authorized for detection and/or diagnosis of  SARS-CoV-2 by FDA under an Emergency Use Authorization (EUA). This EUA will remain in effect (meaning this test can be used) for the duration of the COVID-19 declaration under Section 564(b)(1) of the Act, 21 U.S.C. section 360bbb-3(b)(1), unless the authorization is terminated or revoked.  Performed at [facility], [address] [place], [place] [address]     Labs: CBC: Recent Labs  Lab 05/19/24 0606 05/20/24 0428 05/21/24 0440 05/22/24 0447  WBC 6.8 7.0 7.2 6.8  HGB 10.6* 10.8* 10.5* 10.3*  HCT 34.8* 35.7* 34.4* 34.0*  MCV 91.6 90.8 90.5 89.5  PLT 217 225 224 197   Basic Metabolic Panel: Recent Labs  Lab 05/19/24 0606 05/19/24 1200 05/21/24 0440 05/22/24 0447 05/23/24 0447 05/24/24 0544 05/25/24 0259  NA 141   < > 138 140 141 137 138  K 4.7   < > 4.5 4.6 4.7 5.0 4.3  CL 106   < > 104 106 103 101 103  CO2 24   < >  27 27 30 29 25   GLUCOSE 136*   < > 138* 152* 108* 166* 141*  BUN 35*   < > 35* 37* 42* 52* 49*  CREATININE 2.32*   < > 1.74* 1.68* 1.79* 2.10* 1.67*  CALCIUM  8.1*   < > 8.6* 8.8* 8.6* 9.1 8.9  MG 2.1  --  2.3  --  2.2 2.3 2.4   < > = values in this interval not displayed.   Liver Function Tests: No results for input(s): AST, ALT, ALKPHOS, BILITOT, PROT, ALBUMIN  in the last 168 hours. CBG: Recent Labs  Lab 05/24/24 0731 05/24/24 1140 05/24/24 1626 05/24/24 2105 05/25/24 0806  GLUCAP 142* 148* 135* 165* 147*    Discharge time spent: greater than 30 minutes.  Signed: Demaris Fillers, MD [facility] 05/25/2024

## 2024-05-26 ENCOUNTER — Telehealth: Payer: Self-pay | Admitting: *Deleted

## 2024-05-26 NOTE — Transitions of Care (Post Inpatient/ED Visit) (Signed)
   05/26/2024  Name: ANDILYNN DELAVEGA MRN: [id number] DOB: [birth year]  Today's TOC FU Call Status: Today's TOC FU Call Status:: Unsuccessful Call (1st Attempt) Unsuccessful Call (1st Attempt) Date: 05/26/24  Attempted to reach the patient regarding the most recent Inpatient/ED visit.  Follow Up Plan: Additional outreach attempts will be made to reach the patient to complete the Transitions of Care (Post Inpatient/ED visit) call.   Una Ganser BSN RN [facility] Care Management Coordinator [email or web address] Direct Dial: [phone]  Fax: [phone] Website: [email or web address]

## 2024-05-27 ENCOUNTER — Telehealth: Payer: Self-pay

## 2024-05-27 NOTE — Transitions of Care (Post Inpatient/ED Visit) (Signed)
   05/27/2024  Name: Maureen Jackson MRN: [id number] DOB: 07/17/1956  Today's TOC FU Call Status: Today's TOC FU Call Status:: Unsuccessful Call (2nd Attempt) Unsuccessful Call (2nd Attempt) Date: 05/27/24  Attempted to reach the patient regarding the most recent Inpatient/ED visit.  Follow Up Plan: Additional outreach attempts will be made to reach the patient to complete the Transitions of Care (Post Inpatient/ED visit) call.    Elye Harmsen J. Gayna Braddy RN, MSN [facility], [facility] RN Care Manager Direct Dial: [phone]  Fax: [phone] Website: [email or web address]

## 2024-05-28 ENCOUNTER — Telehealth: Payer: Self-pay

## 2024-05-28 NOTE — Transitions of Care (Post Inpatient/ED Visit) (Signed)
   05/28/2024  Name: Maureen Jackson MRN: [id number] DOB: [birth year]  Today's TOC FU Call Status: Today's TOC FU Call Status:: Unsuccessful Call (3rd Attempt) Unsuccessful Call (3rd Attempt) Date: 05/28/24  Attempted to reach the patient regarding the most recent Inpatient/ED visit.  Follow Up Plan: No further outreach attempts will be made at this time. We have been unable to contact the patient.  Jeanean Hollett J. Landin Tallon RN, MSN [facility], [facility] RN Care Manager Direct Dial: [phone]  Fax: [phone] Website: [email or web address]

## 2024-06-05 ENCOUNTER — Emergency Department (HOSPITAL_COMMUNITY)

## 2024-06-05 ENCOUNTER — Encounter (HOSPITAL_COMMUNITY): Payer: Self-pay

## 2024-06-05 ENCOUNTER — Other Ambulatory Visit: Payer: Self-pay

## 2024-06-05 ENCOUNTER — Emergency Department (HOSPITAL_COMMUNITY)
Admission: EM | Admit: 2024-06-05 | Discharge: 2024-06-05 | Disposition: A | Discharge status: Home / Self Care | Attending: Student | Admitting: Student

## 2024-06-05 DIAGNOSIS — Z79899 Other long term (current) drug therapy: Secondary | ICD-10-CM | POA: Diagnosis not present

## 2024-06-05 DIAGNOSIS — I2510 Atherosclerotic heart disease of native coronary artery without angina pectoris: Secondary | ICD-10-CM | POA: Insufficient documentation

## 2024-06-05 DIAGNOSIS — R404 Transient alteration of awareness: Secondary | ICD-10-CM | POA: Diagnosis not present

## 2024-06-05 DIAGNOSIS — R110 Nausea: Secondary | ICD-10-CM | POA: Diagnosis not present

## 2024-06-05 DIAGNOSIS — E039 Hypothyroidism, unspecified: Secondary | ICD-10-CM | POA: Diagnosis not present

## 2024-06-05 DIAGNOSIS — N1832 Chronic kidney disease, stage 3b: Secondary | ICD-10-CM | POA: Diagnosis not present

## 2024-06-05 DIAGNOSIS — Z87891 Personal history of nicotine dependence: Secondary | ICD-10-CM | POA: Insufficient documentation

## 2024-06-05 DIAGNOSIS — Z7982 Long term (current) use of aspirin: Secondary | ICD-10-CM | POA: Insufficient documentation

## 2024-06-05 DIAGNOSIS — R6883 Chills (without fever): Secondary | ICD-10-CM | POA: Insufficient documentation

## 2024-06-05 DIAGNOSIS — I517 Cardiomegaly: Secondary | ICD-10-CM | POA: Diagnosis not present

## 2024-06-05 DIAGNOSIS — Z7984 Long term (current) use of oral hypoglycemic drugs: Secondary | ICD-10-CM | POA: Insufficient documentation

## 2024-06-05 DIAGNOSIS — I130 Hypertensive heart and chronic kidney disease with heart failure and stage 1 through stage 4 chronic kidney disease, or unspecified chronic kidney disease: Secondary | ICD-10-CM | POA: Insufficient documentation

## 2024-06-05 DIAGNOSIS — R0600 Dyspnea, unspecified: Secondary | ICD-10-CM | POA: Diagnosis not present

## 2024-06-05 DIAGNOSIS — R197 Diarrhea, unspecified: Secondary | ICD-10-CM | POA: Diagnosis not present

## 2024-06-05 DIAGNOSIS — Z7901 Long term (current) use of anticoagulants: Secondary | ICD-10-CM | POA: Insufficient documentation

## 2024-06-05 DIAGNOSIS — I5043 Acute on chronic combined systolic (congestive) and diastolic (congestive) heart failure: Secondary | ICD-10-CM | POA: Diagnosis not present

## 2024-06-05 DIAGNOSIS — Z8616 Personal history of COVID-19: Secondary | ICD-10-CM | POA: Insufficient documentation

## 2024-06-05 DIAGNOSIS — R0989 Other specified symptoms and signs involving the circulatory and respiratory systems: Secondary | ICD-10-CM | POA: Diagnosis not present

## 2024-06-05 DIAGNOSIS — R1111 Vomiting without nausea: Secondary | ICD-10-CM | POA: Diagnosis not present

## 2024-06-05 LAB — CBC WITH DIFFERENTIAL/PLATELET
Abs Immature Granulocytes: 0.04 10*3/uL (ref 0.00–0.07)
Basophils Absolute: 0 10*3/uL (ref 0.0–0.1)
Basophils Relative: 0 %
Eosinophils Absolute: 0.3 10*3/uL (ref 0.0–0.5)
Eosinophils Relative: 4 %
HCT: 32 % — ABNORMAL LOW (ref 36.0–46.0)
Hemoglobin: 9.7 g/dL — ABNORMAL LOW (ref 12.0–15.0)
Immature Granulocytes: 1 %
Lymphocytes Relative: 27 %
Lymphs Abs: 2 10*3/uL (ref 0.7–4.0)
MCH: 28 pg (ref 26.0–34.0)
MCHC: 30.3 g/dL (ref 30.0–36.0)
MCV: 92.5 fL (ref 80.0–100.0)
Monocytes Absolute: 0.4 10*3/uL (ref 0.1–1.0)
Monocytes Relative: 5 %
Neutro Abs: 4.7 10*3/uL (ref 1.7–7.7)
Neutrophils Relative %: 63 %
Platelets: 267 10*3/uL (ref 150–400)
RBC: 3.46 MIL/uL — ABNORMAL LOW (ref 3.87–5.11)
RDW: 15.7 % — ABNORMAL HIGH (ref 11.5–15.5)
WBC: 7.5 10*3/uL (ref 4.0–10.5)
nRBC: 0 % (ref 0.0–0.2)

## 2024-06-05 LAB — URINALYSIS, ROUTINE W REFLEX MICROSCOPIC
Bilirubin Urine: NEGATIVE
Glucose, UA: >=500 mg/dL — AB
Hgb urine dipstick: NEGATIVE
Ketones, ur: NEGATIVE mg/dL
Leukocytes,Ua: NEGATIVE
Nitrite: NEGATIVE
Protein, ur: 100 mg/dL — AB
Specific Gravity, Urine: 1.023 (ref 1.005–1.030)
pH: 5 (ref 5.0–8.0)

## 2024-06-05 LAB — COMPREHENSIVE METABOLIC PANEL WITH GFR
ALT: 21 U/L (ref 0–44)
AST: 23 U/L (ref 15–41)
Albumin: 3.2 g/dL — ABNORMAL LOW (ref 3.5–5.0)
Alkaline Phosphatase: 235 U/L — ABNORMAL HIGH (ref 38–126)
Anion gap: 11 (ref 5–15)
BUN: 31 mg/dL — ABNORMAL HIGH (ref 8–23)
CO2: 27 mmol/L (ref 22–32)
Calcium: 7.4 mg/dL — ABNORMAL LOW (ref 8.9–10.3)
Chloride: 107 mmol/L (ref 98–111)
Creatinine, Ser: 1.45 mg/dL — ABNORMAL HIGH (ref 0.44–1.00)
GFR, Estimated: 40 mL/min — ABNORMAL LOW (ref >60)
Glucose, Bld: 118 mg/dL — ABNORMAL HIGH (ref 70–99)
Potassium: 3.7 mmol/L (ref 3.5–5.1)
Sodium: 145 mmol/L (ref 135–145)
Total Bilirubin: 1.2 mg/dL (ref 0.0–1.2)
Total Protein: 7.8 g/dL (ref 6.5–8.1)

## 2024-06-05 LAB — RESP PANEL BY RT-PCR (RSV, FLU A&B, COVID)  RVPGX2
Influenza A by PCR: NEGATIVE
Influenza B by PCR: NEGATIVE
Resp Syncytial Virus by PCR: NEGATIVE
SARS Coronavirus 2 by RT PCR: NEGATIVE

## 2024-06-05 LAB — BRAIN NATRIURETIC PEPTIDE: B Natriuretic Peptide: 762 pg/mL — ABNORMAL HIGH (ref 0.0–100.0)

## 2024-06-05 LAB — TROPONIN I (HIGH SENSITIVITY)
Troponin I (High Sensitivity): 24 ng/L — ABNORMAL HIGH (ref <18)
Troponin I (High Sensitivity): 24 ng/L — ABNORMAL HIGH (ref <18)

## 2024-06-05 MED ORDER — FUROSEMIDE 10 MG/ML IJ SOLN
40.0000 mg | Freq: Once | INTRAMUSCULAR | Status: AC
  Administered 2024-06-05: 40 mg via INTRAVENOUS
  Filled 2024-06-05: qty 4

## 2024-06-05 MED ORDER — ONDANSETRON 4 MG PO TBDP: 4.0000 mg | ORAL_TABLET | Freq: Three times a day (TID) | ORAL | 0 refills | Status: DC | PRN

## 2024-06-05 MED ORDER — ONDANSETRON HCL 4 MG/2ML IJ SOLN
4.0000 mg | Freq: Once | INTRAMUSCULAR | Status: AC
  Administered 2024-06-05: 4 mg via INTRAVENOUS
  Filled 2024-06-05: qty 2

## 2024-06-05 NOTE — ED Triage Notes (Signed)
 Pt arrived via [facility] from home c/o nausea that Pt reports began today. Pt reports recent admission to the hospital and report she was started on Eliquis  for a blood clot that was discovered in her leg. Pt denies CP, denies SOB.

## 2024-06-05 NOTE — ED Provider Notes (Signed)
 [place] EMERGENCY DEPARTMENT AT [facility] Provider Note  CSN: [id number] Arrival date & time: 06/05/24 1306  Chief Complaint(s) Nausea  HPI Maureen Jackson is a 68 y.o. female with PMH hypothyroidism, CHF with ischemic cardiomyopathy, CAD status post PCI, PAD, HTN, OSA not on CPAP, HTN, CKD, pericardial effusion status post pericardial window who presents emergency department for evaluation of generalized nausea and chills.  Patient recently discharged on 05/25/2024 for CHF exacerbation.  Patient recently diagnosed with DVT and currently started on Eliquis .  States that generalized nausea and chills began today and she comes to the ER for evaluation of why she is just generally feeling poorly.  Currently denies chest pain, shortness of breath, abdominal pain, headache, fever or other systemic symptoms.   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 multivessel CAD by cath in 09/2017 and not felt to be a CABG candidate --> underwent two-vessel PCI with DES to the LAD and DES to the RCA   Diabetes mellitus    Diabetes mellitus, type II (HCC)    Dyspnea    Glaucoma    Hypertension    Left bundle branch block    Morbid obesity (HCC)    Obstructive sleep apnea    does not wear CPAP   Thyroid  disease    Patient Active Problem List   Diagnosis Date Noted   Acute DVT (deep venous thrombosis) (HCC) 05/20/2024   Acute on chronic systolic CHF (congestive heart failure) (HCC) 05/16/2024   Cardiac tamponade 12/21/2022   Acute on chronic combined systolic and diastolic CHF (congestive heart failure) (HCC) 12/20/2022   Acute on chronic combined systolic (congestive) and diastolic (congestive) heart failure (HCC) 05/18/2020   Acute respiratory disease due to COVID-19 virus 12/26/2019    Pressure injury of skin 09/21/2019   Acute renal failure superimposed on stage 3b chronic kidney disease (HCC)    Drug rash    Abscess    Fistula    Fournier's gangrene in female [facility])    Chest pain 07/16/2018   Mixed hyperlipidemia 04/10/2018   TIA (transient ischemic attack) 03/14/2018   Vertigo 03/14/2018   Chronic combined systolic and diastolic CHF (congestive heart failure) (EF 30 to 35 %) 03/14/2018   Ischemic cardiomyopathy 11/06/2017   Coronary artery disease involving native coronary artery of native heart without angina pectoris    Pericardial effusion 09/07/2017   Acute combined systolic (congestive) and diastolic (congestive) heart failure (HCC) 09/06/2017   Cardiomegaly 09/05/2017   Morbid obesity/BMI > 55 03/04/2013   Labyrinthitis 03/04/2013   DM type 2 causing vascular disease (HCC) 03/04/2013   GASTRITIS 12/13/2006   HIATAL HERNIA, HX OF 12/13/2006   S/P thyroidectomy 12/13/2006   Hypothyroidism 10/04/2006   TOBACCO ABUSE 10/04/2006   CARPAL TUNNEL SYNDROME 10/04/2006   Essential hypertension, benign 10/04/2006   GERD 10/04/2006   POSTMENOPAUSAL STATUS 10/04/2006   SKIN TAG 10/04/2006   KNEE PAIN, LEFT 10/04/2006   Sleep apnea 10/04/2006   LEG EDEMA 10/04/2006   Home Medication(s) Prior to Admission medications   Medication Sig Start Date End Date Taking? Authorizing Provider  ondansetron  (ZOFRAN -ODT) 4 MG disintegrating tablet Take 1 tablet (4 mg total) by mouth every 8 (eight) hours as needed for nausea or vomiting.  06/05/24  Yes Maela Takeda, MD  acetaminophen  (TYLENOL ) 325 MG tablet Take 2 tablets (650 mg total) by mouth every 4 (four) hours as needed for mild pain, fever or headache. 05/25/20   Pearlean Manus, MD  apixaban  (ELIQUIS ) 5 MG TABS tablet Take 1 tablet (5 mg total) by mouth 2 (two) times daily. 05/25/24   Evonnie Lenis, MD  aspirin  EC 81 MG tablet Take 1 tablet (81 mg total) by mouth daily. Swallow whole. 05/26/24   Evonnie Lenis, MD   atorvastatin  (LIPITOR ) 80 MG tablet TAKE 1 TABLET(80 MG) BY MOUTH DAILY 04/23/23   Alvan Dorn FALCON, MD  Blood Pressure Monitoring (OMRON 3 SERIES BP MONITOR) DEVI Use as directed 08/27/23   Miriam Norris, NP  calcium  carbonate (TUMS - DOSED IN MG ELEMENTAL CALCIUM ) 500 MG chewable tablet Chew 4 tablets by mouth 2 (two) times daily.    [provider]  cetirizine  (ZYRTEC ) 10 MG tablet Take 10 mg by mouth daily as needed for allergies.  09/12/18   [provider]  COMBIGAN  0.2-0.5 % ophthalmic solution Place 1 drop into both eyes 2 (two) times daily. 02/08/22   [provider]  empagliflozin  (JARDIANCE ) 10 MG TABS tablet Take 1 tablet (10 mg total) by mouth daily. 05/26/24   Evonnie Lenis, MD  furosemide  (LASIX ) 40 MG tablet Take 1.5 tablets (60 mg total) by mouth daily. 12/11/23   Clegg, Amy D, NP  glucose blood (ONETOUCH VERIO) test strip Test glucose 4 times day. 04/19/22   Nida, Gebreselassie W, MD  insulin  aspart (NOVOLOG ) 100 UNIT/ML injection Inject 0-15 Units into the skin 3 (three) times daily with meals. 01/02/23   Bryn Bernardino NOVAK, MD  levothyroxine  (SYNTHROID ) 200 MCG tablet Take 200 mcg by mouth daily before breakfast. Take 1 tablet with 50mcg for a total dose of 250mcg daily    [provider]  levothyroxine  (SYNTHROID ) 50 MCG tablet Take 50 mcg by mouth daily before breakfast. Take 1 tablet with 200mcg for a total dose of 250mcg daily    [provider]  metoprolol  succinate (TOPROL -XL) 25 MG 24 hr tablet TAKE 1 TABLET(25 MG) BY MOUTH DAILY 02/18/24   Miriam Norris, NP  OZEMPIC, 0.25 OR 0.5 MG/DOSE, 2 MG/3ML SOPN Inject 0.5 mg into the skin once a week. Fridays 05/03/23   [provider]  sacubitril -valsartan  (ENTRESTO ) 49-51 MG Take 1 tablet by mouth 2 (two) times daily. 05/25/24   Evonnie Lenis, MD  senna-docusate (SENOKOT-S) 8.6-50 MG tablet Take 2 tablets by mouth 2 (two) times daily. Patient taking differently: Take 2 tablets by mouth at  bedtime as needed for mild constipation. 05/25/20   Pearlean Manus, MD  spironolactone  (ALDACTONE ) 25 MG tablet Take 0.5 tablets (12.5 mg total) by mouth daily. 05/25/24   Evonnie Lenis, MD  VENTOLIN  HFA 108 (90 Base) MCG/ACT inhaler Inhale 1-2 puffs into the lungs every 6 (six) hours as needed for wheezing or shortness of breath. 05/25/20   Pearlean Manus, MD  Past Surgical History Past Surgical History:  Procedure Laterality Date   ABDOMINAL HYSTERECTOMY     CARDIAC CATHETERIZATION  09/12/2017   CORONARY STENT INTERVENTION  09/12/2017   STENT RESOLUTE ONYX 7.24K61 drug eluting stent was successfully placed   CORONARY STENT INTERVENTION N/A 09/12/2017   Procedure: CORONARY STENT INTERVENTION;  Surgeon: Anner Alm ORN, MD;  [facility] INVASIVE CV LAB;  Service: Cardiovascular;  Laterality: N/A;   INCISION AND DRAINAGE PERIRECTAL ABSCESS Left 09/16/2019   Procedure: IRRIGATION AND DEBRIDEMENT LABIA ABSCESS;  Surgeon: Vernetta Berg, MD;  Location: [facility] OR;  Service: General;  Laterality: Left;   IR FLUORO GUIDE CV LINE RIGHT  09/16/2019   IR US  GUIDE VASC ACCESS RIGHT  09/16/2019   LEFT HEART CATH AND CORONARY ANGIOGRAPHY N/A 09/12/2017   Procedure: LEFT HEART CATH AND CORONARY ANGIOGRAPHY;  Surgeon: Anner Alm ORN, MD;  Location: [facility] INVASIVE CV LAB;  Service: Cardiovascular;  Laterality: N/A;   MASS EXCISION N/A 12/18/2018   Procedure: EXCISION TONGUE MASS;  Surgeon: Karis Clunes, MD;  Location: [facility] OR;  Service: ENT;  Laterality: N/A;   PERICARDIOCENTESIS N/A 12/22/2022   Procedure: PERICARDIOCENTESIS;  Surgeon: Wendel Lurena POUR, MD;  Location: [facility] INVASIVE CV LAB;  Service: Cardiovascular;  Laterality: N/A;   RIGHT/LEFT HEART CATH AND CORONARY ANGIOGRAPHY N/A 09/10/2017   Procedure: RIGHT/LEFT HEART CATH AND CORONARY ANGIOGRAPHY;  Surgeon: Burnard Debby LABOR, MD;   Location: [facility] INVASIVE CV LAB;  Service: Cardiovascular;  Laterality: N/A;   SUBXYPHOID PERICARDIAL WINDOW N/A 12/23/2022   Procedure: SUBXYPHOID PERICARDIAL WINDOW;  Surgeon: Maryjane Mt, MD;  Location: [facility] OR;  Service: Thoracic;  Laterality: N/A;   TEE WITHOUT CARDIOVERSION N/A 12/23/2022   Procedure: TRANSESOPHAGEAL ECHOCARDIOGRAM (TEE);  Surgeon: Maryjane Mt, MD;  Location: [facility] OR;  Service: Thoracic;  Laterality: N/A;   THYROID  SURGERY     Family History Family History  Problem Relation Age of Onset   Diabetes Mother    Hypertension Mother    Cancer Mother        pancreas   Hypertension Sister     Social History Social History   Tobacco Use   Smoking status: Former    Current packs/day: 0.00    Types: Cigarettes    Quit date: 12/14/2005    Years since quitting: 18.4    Passive exposure: Never   Smokeless tobacco: Never   Tobacco comments:    QUIT IN 2005  Vaping Use   Vaping status: Never Used  Substance Use Topics   Alcohol use: No   Drug use: No   Allergies Ampicillin , Aspirin , Hydrocodone , and Unasyn  [ampicillin -sulbactam sodium ]  Review of Systems Review of Systems  Constitutional:  Positive for fatigue.  Gastrointestinal:  Positive for nausea.    Physical Exam Vital Signs  I have reviewed the triage vital signs BP 136/77 (BP Location: Right Arm)   Pulse 94   Temp 98.5 F (36.9 C) (Oral)   Resp 15   Ht 5' 5 (1.651 m)   Wt (!) 158.6 kg   SpO2 98%   BMI 58.18 kg/m   Physical Exam Vitals and nursing note reviewed.  Constitutional:      General: She is not in acute distress.    Appearance: She is well-developed.  HENT:     Head: Normocephalic and atraumatic.   Eyes:     Conjunctiva/sclera: Conjunctivae normal.    Cardiovascular:     Rate and Rhythm: Normal rate and regular rhythm.     Heart sounds: No murmur  heard. Pulmonary:     Effort: Pulmonary effort is normal. No respiratory distress.     Breath sounds: Normal breath sounds.   Abdominal:     Palpations: Abdomen is soft.     Tenderness: There is no abdominal tenderness.   Musculoskeletal:        General: No swelling.     Cervical back: Neck supple.   Skin:    General: Skin is warm and dry.     Capillary Refill: Capillary refill takes less than 2 seconds.   Neurological:     Mental Status: She is alert.   Psychiatric:        Mood and Affect: Mood normal.     ED Results and Treatments Labs (all labs ordered are listed, but only abnormal results are displayed) Labs Reviewed  COMPREHENSIVE METABOLIC PANEL WITH GFR - Abnormal; Notable for the following components:      Result Value   Glucose, Bld 118 (*)    BUN 31 (*)    Creatinine, Ser 1.45 (*)    Calcium  7.4 (*)    Albumin  3.2 (*)    Alkaline Phosphatase 235 (*)    GFR, Estimated 40 (*)    All other components within normal limits  CBC WITH DIFFERENTIAL/PLATELET - Abnormal; Notable for the following components:   RBC 3.46 (*)    Hemoglobin 9.7 (*)    HCT 32.0 (*)    RDW 15.7 (*)    All other components within normal limits  BRAIN NATRIURETIC PEPTIDE - Abnormal; Notable for the following components:   B Natriuretic Peptide 762.0 (*)    All other components within normal limits  URINALYSIS, ROUTINE W REFLEX MICROSCOPIC - Abnormal; Notable for the following components:   APPearance HAZY (*)    Glucose, UA >=500 (*)    Protein, ur 100 (*)    Bacteria, UA RARE (*)    All other components within normal limits  TROPONIN I (HIGH SENSITIVITY) - Abnormal; Notable for the following components:   Troponin I (High Sensitivity) 24 (*)    All other components within normal limits  TROPONIN I (HIGH SENSITIVITY) - Abnormal; Notable for the following components:   Troponin I (High Sensitivity) 24 (*)    All other components within normal limits  RESP PANEL BY RT-PCR (RSV, FLU A&B, COVID)  RVPGX2                                                                                                                           Radiology DG Chest Portable 1 View Result Date: 06/05/2024 CLINICAL DATA:  dyspnea EXAM: PORTABLE CHEST - 1 VIEW COMPARISON:  May 19, 2024 FINDINGS: Central pulmonary vascular congestion. No focal airspace consolidation, pleural effusion, or pneumothorax. Moderate cardiomegaly. No acute fracture or destructive lesions. Multilevel thoracic osteophytosis. IMPRESSION: Moderate cardiomegaly with central pulmonary vascular congestion. Electronically Signed   By: Rogelia Myers M.D.   On: 06/05/2024 16:04    Pertinent labs & imaging results  that were available during my care of the patient were reviewed by me and considered in my medical decision making (see MDM for details).  Medications Ordered in ED Medications  ondansetron  (ZOFRAN ) injection 4 mg (4 mg Intravenous Given 06/05/24 1629)  furosemide  (LASIX ) injection 40 mg (40 mg Intravenous Given 06/05/24 1855)                                                                                                                                     Procedures Procedures  (including critical care time)  Medical Decision Making / ED Course   This patient presents to the ED for concern of nausea, this involves an extensive number of treatment options, and is a complaint that carries with it a high risk of complications and morbidity.  The differential diagnosis includes electrolyte abnormality, abdominal infection, gastroenteritis, gastritis, pancreatitis  MDM: Patient seen in the emerged part for evaluation of nausea.  Physical exam with no appreciable tenderness in the abdomen, no significant rales on lung exam and very minimal pitting edema bilaterally.  Laboratory evaluation with a BUN of 30, creatinine 1.45, no significant leukocytosis, hemoglobin 9.7 which is near patient's baseline, urinalysis negative for infection, BNP elevated to 762, high-sensitivity troponin 24 but delta troponin is flat at 24.  COVID, flu, RSV negative.  Chest x-ray  with central venous congestion and moderate cardiomegaly but no frank pulmonary edema.  Patient is not short of breath and is not hypoxic here in the emergency department and thus we will treat her elevated BNP and vascular congestion with single dose IV Lasix .  Her nausea was treated with Bentyl regulation symptoms have improved.  She is able to tolerate p.o. without difficulty.  Given no abdominal tenderness to palpation and benign exam we will defer CT imaging at this time.  At this time she does not meet inpatient criteria for admission will be discharged with outpatient follow-up return precautions given of which she voiced understanding.   Additional history obtained:  -External records from outside source obtained and reviewed including: Chart review including previous notes, labs, imaging, consultation notes   Lab Tests: -I ordered, reviewed, and interpreted labs.   The pertinent results include:   Labs Reviewed  COMPREHENSIVE METABOLIC PANEL WITH GFR - Abnormal; Notable for the following components:      Result Value   Glucose, Bld 118 (*)    BUN 31 (*)    Creatinine, Ser 1.45 (*)    Calcium  7.4 (*)    Albumin  3.2 (*)    Alkaline Phosphatase 235 (*)    GFR, Estimated 40 (*)    All other components within normal limits  CBC WITH DIFFERENTIAL/PLATELET - Abnormal; Notable for the following components:   RBC 3.46 (*)    Hemoglobin 9.7 (*)    HCT 32.0 (*)    RDW 15.7 (*)    All other components within normal limits  BRAIN NATRIURETIC  PEPTIDE - Abnormal; Notable for the following components:   B Natriuretic Peptide 762.0 (*)    All other components within normal limits  URINALYSIS, ROUTINE W REFLEX MICROSCOPIC - Abnormal; Notable for the following components:   APPearance HAZY (*)    Glucose, UA >=500 (*)    Protein, ur 100 (*)    Bacteria, UA RARE (*)    All other components within normal limits  TROPONIN I (HIGH SENSITIVITY) - Abnormal; Notable for the following components:    Troponin I (High Sensitivity) 24 (*)    All other components within normal limits  TROPONIN I (HIGH SENSITIVITY) - Abnormal; Notable for the following components:   Troponin I (High Sensitivity) 24 (*)    All other components within normal limits  RESP PANEL BY RT-PCR (RSV, FLU A&B, COVID)  [id number]        Imaging Studies ordered: I ordered imaging studies including chest x-ray I independently visualized and interpreted imaging. I agree with the radiologist interpretation   Medicines ordered and prescription drug management: Meds ordered this encounter  Medications   ondansetron  (ZOFRAN ) injection 4 mg   furosemide  (LASIX ) injection 40 mg   ondansetron  (ZOFRAN -ODT) 4 MG disintegrating tablet    Sig: Take 1 tablet (4 mg total) by mouth every 8 (eight) hours as needed for nausea or vomiting.    Dispense:  20 tablet    Refill:  0    -I have reviewed the patients home medicines and have made adjustments as needed  Critical interventions none   Cardiac Monitoring: The patient was maintained on a cardiac monitor.  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 multivessel CAD by cath in 09/2017 and not felt to be a CABG candidate --> underwent two-vessel PCI with DES to the LAD and DES to the RCA   Diabetes mellitus    Diabetes mellitus, type II (HCC)    Dyspnea    Glaucoma    Hypertension    Left bundle branch block    Morbid obesity (HCC)     Obstructive sleep apnea    does not wear CPAP   Thyroid  disease       Dispostion: I considered admission for this patient, but at this time she does not meet inpatient criteria for admission and will be discharged outpatient follow-up     Final Clinical Impression(s) / ED Diagnoses Final diagnoses:  Nausea     @PCDICTATION @    Albertina Dixon, MD 06/06/24 [phone]

## 2024-06-05 NOTE — ED Notes (Signed)
 Pt tolerated water

## 2024-06-10 DIAGNOSIS — I82531 Chronic embolism and thrombosis of right popliteal vein: Secondary | ICD-10-CM | POA: Diagnosis not present

## 2024-06-20 ENCOUNTER — Ambulatory Visit: Admitting: Nurse Practitioner

## 2024-06-24 ENCOUNTER — Encounter (HOSPITAL_COMMUNITY)

## 2024-06-25 ENCOUNTER — Telehealth (HOSPITAL_COMMUNITY): Payer: Self-pay

## 2024-06-25 NOTE — Telephone Encounter (Signed)
 Called to confirm/remind patient of their appointment [facility] on 06/26/2024 11:00.   Appointment:   [] Confirmed  [x] Left mess   [] No answer/No voice mail  [] VM Full/unable to leave message  [] Phone not in service  Patient reminded to bring all medications and/or complete list.  Confirmed patient has transportation. Gave directions, instructed to utilize valet parking.

## 2024-06-26 ENCOUNTER — Encounter (HOSPITAL_COMMUNITY): Payer: Self-pay

## 2024-06-26 ENCOUNTER — Ambulatory Visit (HOSPITAL_COMMUNITY)
Admission: RE | Admit: 2024-06-26 | Discharge: 2024-06-26 | Disposition: A | Discharge status: Home / Self Care | Source: Ambulatory Visit | Attending: Adult Health | Admitting: Adult Health

## 2024-06-26 VITALS — BP 132/90 | HR 72 | Ht 65.0 in | Wt 343.5 lb

## 2024-06-26 DIAGNOSIS — Z7985 Long-term (current) use of injectable non-insulin antidiabetic drugs: Secondary | ICD-10-CM | POA: Diagnosis not present

## 2024-06-26 DIAGNOSIS — N1832 Chronic kidney disease, stage 3b: Secondary | ICD-10-CM | POA: Diagnosis not present

## 2024-06-26 DIAGNOSIS — G4733 Obstructive sleep apnea (adult) (pediatric): Secondary | ICD-10-CM | POA: Diagnosis not present

## 2024-06-26 DIAGNOSIS — Z7984 Long term (current) use of oral hypoglycemic drugs: Secondary | ICD-10-CM | POA: Diagnosis not present

## 2024-06-26 DIAGNOSIS — Z955 Presence of coronary angioplasty implant and graft: Secondary | ICD-10-CM | POA: Diagnosis not present

## 2024-06-26 DIAGNOSIS — Z79899 Other long term (current) drug therapy: Secondary | ICD-10-CM | POA: Insufficient documentation

## 2024-06-26 DIAGNOSIS — I5042 Chronic combined systolic (congestive) and diastolic (congestive) heart failure: Secondary | ICD-10-CM | POA: Diagnosis not present

## 2024-06-26 DIAGNOSIS — I5022 Chronic systolic (congestive) heart failure: Secondary | ICD-10-CM | POA: Insufficient documentation

## 2024-06-26 DIAGNOSIS — Z8249 Family history of ischemic heart disease and other diseases of the circulatory system: Secondary | ICD-10-CM | POA: Insufficient documentation

## 2024-06-26 DIAGNOSIS — I428 Other cardiomyopathies: Secondary | ICD-10-CM | POA: Insufficient documentation

## 2024-06-26 DIAGNOSIS — Z87891 Personal history of nicotine dependence: Secondary | ICD-10-CM | POA: Diagnosis not present

## 2024-06-26 DIAGNOSIS — E1122 Type 2 diabetes mellitus with diabetic chronic kidney disease: Secondary | ICD-10-CM | POA: Insufficient documentation

## 2024-06-26 DIAGNOSIS — Z6841 Body Mass Index (BMI) 40.0 and over, adult: Secondary | ICD-10-CM | POA: Insufficient documentation

## 2024-06-26 DIAGNOSIS — Z86718 Personal history of other venous thrombosis and embolism: Secondary | ICD-10-CM | POA: Diagnosis not present

## 2024-06-26 DIAGNOSIS — Z794 Long term (current) use of insulin: Secondary | ICD-10-CM | POA: Insufficient documentation

## 2024-06-26 DIAGNOSIS — I255 Ischemic cardiomyopathy: Secondary | ICD-10-CM | POA: Diagnosis not present

## 2024-06-26 DIAGNOSIS — Z833 Family history of diabetes mellitus: Secondary | ICD-10-CM | POA: Insufficient documentation

## 2024-06-26 DIAGNOSIS — I3139 Other pericardial effusion (noninflammatory): Secondary | ICD-10-CM | POA: Insufficient documentation

## 2024-06-26 DIAGNOSIS — E039 Hypothyroidism, unspecified: Secondary | ICD-10-CM | POA: Insufficient documentation

## 2024-06-26 DIAGNOSIS — I2510 Atherosclerotic heart disease of native coronary artery without angina pectoris: Secondary | ICD-10-CM | POA: Insufficient documentation

## 2024-06-26 DIAGNOSIS — I10 Essential (primary) hypertension: Secondary | ICD-10-CM | POA: Diagnosis not present

## 2024-06-26 DIAGNOSIS — Z7902 Long term (current) use of antithrombotics/antiplatelets: Secondary | ICD-10-CM | POA: Insufficient documentation

## 2024-06-26 DIAGNOSIS — I110 Hypertensive heart disease with heart failure: Secondary | ICD-10-CM | POA: Diagnosis not present

## 2024-06-26 DIAGNOSIS — N183 Chronic kidney disease, stage 3 unspecified: Secondary | ICD-10-CM | POA: Diagnosis not present

## 2024-06-26 DIAGNOSIS — M25562 Pain in left knee: Secondary | ICD-10-CM | POA: Diagnosis not present

## 2024-06-26 DIAGNOSIS — Z5982 Transportation insecurity: Secondary | ICD-10-CM | POA: Diagnosis not present

## 2024-06-26 MED ORDER — FUROSEMIDE 40 MG PO TABS: 60.0000 mg | ORAL_TABLET | Freq: Every day | ORAL | 3 refills | Status: DC

## 2024-06-26 NOTE — Progress Notes (Signed)
 ADVANCED HEART FAILURE CLINIC NOTE  Referring Physician: Orpha Yancey LABOR, MD  Primary Care: Orpha Yancey LABOR, MD Primary Cardiologist: Dr. Dorn Ross HF MD: Dr Gardenia.   Chief Complaint: Heart Failure   HPI: Maureen Jackson is a 68 y.o. 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  Echocardiogram at that time with EF of 30 to 35% with left heart cath demonstrating severe multivessel coronary artery disease.  Patient underwent PCI as opposed to CABG with DES to the LAD, x 2 to the RCA and left circumflex disease managed medically.  In 2021 EF dropped to 30% with recovery to 40 to 50% in April 2023.  Patient underwent unsuccessful pericardiocentesis for a large effusion in January 2024 eventually requiring creation of a pericardial window.  Had Iron  panel 11/12/2023- Iron  sats 8% tron 32 Ferritin 11.  Admitted 05/2024 with A/C HFrEF. Diuresed with IV lasix  and transitioned to po lasix  60 mg daily.  Echo EF 30-35%. She was also treated for DVT RLE Tibial Vein.   Today she returns for HF follow up. Functionally limited by L knee pain. She had a recent fall and tells me she does not take lasix  every day due to frequent urination.  Has not had medications today. Gets short of breath with exertion. Denies PND/Orthopnea. Appetite ok. No fever or chills.  Only taking lasix  2-3 days a week. She has an Aide 3-4 days a week.     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 multivessel CAD by cath in 09/2017 and not felt to be a CABG candidate --> underwent two-vessel PCI with DES to the LAD and DES to the RCA   Diabetes mellitus    Diabetes mellitus, type II (HCC)     Dyspnea    Glaucoma    Hypertension    Left bundle branch block    Morbid obesity (HCC)    Obstructive sleep apnea    does not wear CPAP   Thyroid  disease     Current Outpatient Medications  Medication Sig Dispense Refill   acetaminophen  (TYLENOL ) 325 MG tablet Take 2 tablets (650 mg total) by mouth every 4 (four) hours as needed for mild pain, fever or headache. 12 tablet 0   apixaban  (ELIQUIS ) 5 MG TABS tablet Take 1 tablet (5 mg total) by mouth 2 (two) times daily. 60 tablet 1   aspirin  EC 81 MG tablet Take 1 tablet (81 mg total) by mouth daily. Swallow whole.     atorvastatin  (LIPITOR ) 80 MG tablet TAKE 1 TABLET(80 MG) BY MOUTH DAILY 30 tablet 6   Blood Pressure Monitoring (OMRON 3 SERIES BP MONITOR) DEVI Use as directed 1 each 1   calcium  carbonate (TUMS - DOSED IN MG ELEMENTAL CALCIUM ) 500 MG chewable tablet Chew 4 tablets by mouth 2 (two) times daily.     cetirizine  (ZYRTEC ) 10 MG tablet Take 10 mg by mouth daily as needed for allergies.   0   COMBIGAN  0.2-0.5 % ophthalmic solution Place 1 drop into both eyes 2 (two) times daily.     empagliflozin  (JARDIANCE )  10 MG TABS tablet Take 1 tablet (10 mg total) by mouth daily. 30 tablet 1   furosemide  (LASIX ) 40 MG tablet Take 1.5 tablets (60 mg total) by mouth daily. 90 tablet 3   glucose blood (ONETOUCH VERIO) test strip Test glucose 4 times day. 150 each 2   insulin  aspart (NOVOLOG ) 100 UNIT/ML injection Inject 0-15 Units into the skin 3 (three) times daily with meals.     levothyroxine  (SYNTHROID ) 200 MCG tablet Take 200 mcg by mouth daily before breakfast. Take 1 tablet with 50mcg for a total dose of 250mcg daily     levothyroxine  (SYNTHROID ) 50 MCG tablet Take 50 mcg by mouth daily before breakfast. Take 1 tablet with 200mcg for a total dose of 250mcg daily     metoprolol  succinate (TOPROL -XL) 25 MG 24 hr tablet TAKE 1 TABLET(25 MG) BY MOUTH DAILY 90 tablet 0   ondansetron  (ZOFRAN -ODT) 4 MG disintegrating tablet Take 1 tablet (4 mg  total) by mouth every 8 (eight) hours as needed for nausea or vomiting. 20 tablet 0   OZEMPIC, 0.25 OR 0.5 MG/DOSE, 2 MG/3ML SOPN Inject 0.5 mg into the skin once a week. Fridays     sacubitril -valsartan  (ENTRESTO ) 49-51 MG Take 1 tablet by mouth 2 (two) times daily. 60 tablet 1   senna-docusate (SENOKOT-S) 8.6-50 MG tablet Take 2 tablets by mouth 2 (two) times daily. (Patient taking differently: Take 2 tablets by mouth at bedtime as needed for mild constipation.) 60 tablet 3   spironolactone  (ALDACTONE ) 25 MG tablet Take 0.5 tablets (12.5 mg total) by mouth daily.     VENTOLIN  HFA 108 (90 Base) MCG/ACT inhaler Inhale 1-2 puffs into the lungs every 6 (six) hours as needed for wheezing or shortness of breath. 18 g 1   No current facility-administered medications for this encounter.    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, he passed  Tobacco Use   Smoking status: Former    Current packs/day: 0.00    Types: Cigarettes    Quit date: 12/14/2005    Years since quitting: 18.5    Passive exposure: Never   Smokeless tobacco: Never   Tobacco comments:    QUIT IN 2005  Vaping Use   Vaping status: Never Used  Substance and Sexual Activity   Alcohol use: No   Drug use: No   Sexual activity: Never  Other Topics Concern   Not on file  Social History Narrative   Not on file   Social Drivers of Health   Financial Resource Strain: Low Risk  (05/19/2024)   Overall Financial Resource Strain (CARDIA)     Difficulty of Paying Living Expenses: Not very hard  Food Insecurity: No Food Insecurity (05/19/2024)   Hunger Vital Sign    Worried About Running Out of Food in the Last Year: Never true    Ran Out of Food in the Last Year: Never true  Transportation Needs: Unmet  Transportation Needs (05/19/2024)   PRAPARE - Administrator, Civil Service (Medical): Yes    Lack of Transportation (Non-Medical): Yes  Physical Activity: Not on file  Stress: Not on file  Social Connections: Unknown (05/16/2024)   Social Connection and Isolation Panel    Frequency of Communication with Friends and Family: Three times a week    Frequency of Social Gatherings with Friends and Family: Once a week    Attends Religious Services: 1 to 4 times per year    Active Member of [employer] or Organizations: No    Attends Banker Meetings: Never    Marital Status: Patient declined  Catering manager Violence: Not At Risk (05/16/2024)   Humiliation, Afraid, Rape, and Kick questionnaire    Fear of Current or Ex-Partner: No    Emotionally Abused: No    Physically Abused: No    Sexually Abused: No      Family History  Problem Relation Age of Onset   Diabetes Mother    Hypertension Mother    Cancer Mother        pancreas   Hypertension Sister     PHYSICAL EXAM: Vitals:   06/26/24 1119  BP: (!) 132/90  Pulse: 72  SpO2: 98%   Wt Readings from Last 3 Encounters:  06/26/24 (!) 155.8 kg (343 lb 7.6 oz)  06/05/24 (!) 158.6 kg (349 lb 10.4 oz)  05/25/24 (!) 158.6 kg (349 lb 10.4 oz)    General:   Arrived in a wheel chair.  Neck: no JVD.  Cor:  Regular rate & rhythm. No rubs, gallops or murmurs. Lungs: clear Abdomen: soft, nontender, nondistended.  Extremities: no  edema Neuro: alert & oriented x3  DATA REVIEW  ECG: 05/12/23: Normal sinus rhythm with left bundle branch block as per my personal interpretation  ECHO: 08/20/23: 30 to 35%, normal RV function as per my personal  interpretation  CATH: 09/11/23:  Severe multivessel CAD with 60% proximal LAD stenosis and 85% mid LAD stenosis; 80, 90 and 80%  OM1 stenosis of the left circumflex with 60% mid AV groove and 50% mid distal AV groove stenosis; dominant RCA with 30% proximal followed by 70% proximal stenosis, 90% mid stenosis, and diffuse mid distal PDA stenosis of at least 80%.   ASSESSMENT & PLAN:  Heart failure with reduced EF Echo 08/2023 EF 30-35% RV normal.  Etiology of YQ:fpkzi ischemic and nonischemic cardiomyopathy.  NYHA class / AHA Stage:NYHA II Volume status & Diuretics: Needs to take lasix  60 mg daily.  Stressed importance of medication compliance.  Vasodilators: Continue  Entresto  to 97/103mg  BID Beta-Blocker:Continue toprol  25mg  daily.  FMJ:Rnwupwlz spironolactone  25mg  daily Cardiometabolic:Continue jardiance  25 mg daily Devices therapies & Valvulopathies:N/A Advanced therapies:Not a candidate due to obesity.   Iron  sats 8%. Per ACC/AHA 2024 guidelines will set up for IV Iron .  Check BMET  2. Pericardial effusion s/p window - Failed pericardiocentesis; s/p window in 12/2022 - Felt to be secondary to hypothyroidism.   3. CAD - continue lipitor , plavix  and toprol  XL.  -No chest pain.   4. CKD 3A-3B - continue jardiance  25mg  daily - Check BMET today.   5. Obesity - Continue Ozempic.  - Body mass index is 57.16 kg/m. - Discussed portion control.   6 Hypertension Slightly elevated.  Refer to [facility]   Follow up 6-8 weeks.    Maleia Weems NP-C  11:34 AM

## 2024-06-26 NOTE — Patient Instructions (Signed)
 Medication Changes:  TAKE LASIX  (FUROSEMIDE ) 60MG  ONCE DAILY   Lab Work:  Labs done today, your results will be available in MyChart, we will contact you for abnormal readings.  Referrals:  YOU HAVE BEEN REFERRED TO [facility]AMEDICINE [place]  THEY WILL REACH OUT TO YOU OR CALL TO ARRANGE THIS. PLEASE CALL US  WITH ANY CONCERNS   Follow-Up in: 3 WEEKS WITH APP AS SCHEDULED   THEN AGAIN IN 6-8 WEEKS AS SCHEDULED WITH DR. GARDENIA   At the Advanced [facility] Failure Clinic, you and your health needs are our priority. We have a designated team specialized in the treatment of Heart Failure. This Care Team includes your primary Heart Failure Specialized Cardiologist (physician), Advanced Practice Providers (APPs- Physician Assistants and Nurse Practitioners), and Pharmacist who all work together to provide you with the care you need, when you need it.   You may see any of the following providers on your designated Care Team at your next follow up:  Dr. Toribio Fuel Dr. Ezra Shuck Dr. Ria GARDENIA Dr. Odis Brownie Greig Mosses, NP Caffie Shed, [place] [facility] [place], [place] Beckey Coe, NP [place] Lee, NP Tinnie Redman, PharmD   Please be sure to bring in all your medications bottles to every appointment.   Need to Contact Us :  If you have any questions or concerns before your next appointment please send us  a message through [place] or call our office at [phone].    TO LEAVE A MESSAGE FOR THE NURSE SELECT OPTION 2, PLEASE LEAVE A MESSAGE INCLUDING: YOUR NAME DATE OF BIRTH CALL BACK NUMBER REASON FOR CALL**this is important as we prioritize the call backs  YOU WILL RECEIVE A CALL BACK THE SAME DAY AS LONG AS YOU CALL BEFORE 4:00 PM

## 2024-06-26 NOTE — Progress Notes (Signed)
 Unable to obtain labs (bmet/bnp) pt sch to return for labs on Wed 7/23, Greig Mosses, NP aware

## 2024-06-30 ENCOUNTER — Telehealth (HOSPITAL_COMMUNITY): Payer: Self-pay

## 2024-06-30 NOTE — Telephone Encounter (Signed)
 Referral faxed over to [facility] [facility].

## 2024-07-02 ENCOUNTER — Other Ambulatory Visit (HOSPITAL_COMMUNITY)

## 2024-07-07 ENCOUNTER — Other Ambulatory Visit (HOSPITAL_COMMUNITY): Payer: Self-pay | Admitting: Cardiology

## 2024-07-07 DIAGNOSIS — I5042 Chronic combined systolic (congestive) and diastolic (congestive) heart failure: Secondary | ICD-10-CM

## 2024-07-07 NOTE — Addendum Note (Signed)
 Addended by: JERONA DALTON HERO on: 07/07/2024 01:14 PM   Modules accepted: Orders

## 2024-07-08 ENCOUNTER — Other Ambulatory Visit (HOSPITAL_COMMUNITY)

## 2024-07-17 ENCOUNTER — Telehealth (HOSPITAL_COMMUNITY): Payer: Self-pay

## 2024-07-17 NOTE — Telephone Encounter (Signed)
 Called to confirm/remind patient of their appointment at the [facility] on 07/18/24.   Appointment:   [] Confirmed  [x] Left mess   [] No answer/No voice mail  [] VM Full/unable to leave message  [] Phone not in service  And to bring in all medications and/or complete list.  Confirmed patient has transportation. Gave

## 2024-07-18 ENCOUNTER — Encounter (HOSPITAL_COMMUNITY)

## 2024-07-30 NOTE — Progress Notes (Signed)
 ADVANCED HEART FAILURE CLINIC NOTE Primary Care: Orpha Yancey LABOR, MD Primary Cardiologist: Dr. Dorn Ross HF Cardiologist: Dr Gardenia.   HPI: Maureen Jackson is a 68 y.o. 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   Her history of heart failure dates back to at least 2018. Echo at that time with EF of 30 to 35%, LHC showed severe multivessel coronary artery disease. Underwent PCI as opposed to CABG with DES to the LAD, x 2 to the RCA and left circumflex disease managed medically.  In 2021 EF dropped to 30% with recovery to 40 to 50% in April 2023. Underwent unsuccessful pericardiocentesis for a large effusion in January 2024 eventually requiring creation of a pericardial window.  Admitted 6/25 with a/c systolic heart failure. Diuresed with IV lasix  and transitioned to po lasix  60 mg daily.  Echo EF 30-35%. She was also treated for DVT RLE tibial vein.   Today she returns for HF follow up. Overall feeling fine. 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 Unable to walk up steps due to leg pain. Denies palpitations, abnormal bleeding, CP, dizziness, edema, or PND/Orthopnea. Appetite ok. She does not weigh at home. Taking all medications. [facility]ed [facility]. She reduced her Lasix  to every other day due to increased urination. She has HH aide 3-4 days/week.  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 multivessel CAD by cath in 09/2017 and not felt to be a CABG candidate --> underwent two-vessel PCI with DES to the LAD and DES to the RCA   Diabetes mellitus    Diabetes mellitus, type II (HCC)    Dyspnea    Glaucoma    Hypertension    Left  bundle branch block    Morbid obesity (HCC)    Obstructive sleep apnea    does not wear CPAP   Thyroid  disease     Current Outpatient Medications  Medication Sig Dispense Refill   acetaminophen  (TYLENOL ) 325 MG tablet Take 2 tablets (650 mg total) by mouth every 4 (four) hours as needed for mild pain, fever or headache. 12 tablet 0   apixaban  (ELIQUIS ) 5 MG TABS tablet Take 1 tablet (5 mg total) by mouth 2 (two) times daily. 60 tablet 1   aspirin  EC 81 MG tablet Take 1 tablet (81 mg total) by mouth daily. Swallow whole.     atorvastatin  (LIPITOR ) 80 MG tablet TAKE 1 TABLET(80 MG) BY MOUTH DAILY 30 tablet 6   Blood Pressure Monitoring (OMRON 3 SERIES BP MONITOR) DEVI Use as directed 1 each 1   calcium  carbonate (TUMS - DOSED IN MG ELEMENTAL CALCIUM ) 500 MG chewable tablet Chew 4 tablets by mouth 2 (two) times daily.     cetirizine  (ZYRTEC ) 10 MG tablet Take 10 mg by mouth daily as needed for allergies.   0   COMBIGAN  0.2-0.5 % ophthalmic solution Place 1 drop into both eyes 2 (two) times daily.     empagliflozin  (JARDIANCE ) 10 MG TABS tablet Take 1 tablet (10 mg total) by mouth daily. 30 tablet 1   furosemide  (  LASIX ) 40 MG tablet Take 1.5 tablets (60 mg total) by mouth daily. (Patient taking differently: Take 60 mg by mouth every other day.) 90 tablet 3   glucose blood (ONETOUCH VERIO) test strip Test glucose 4 times day. 150 each 2   insulin  aspart (NOVOLOG ) 100 UNIT/ML injection Inject 0-15 Units into the skin 3 (three) times daily with meals.     levothyroxine  (SYNTHROID ) 200 MCG tablet Take 200 mcg by mouth daily before breakfast. Take 1 tablet with 50mcg for a total dose of 250mcg daily     levothyroxine  (SYNTHROID ) 50 MCG tablet Take 50 mcg by mouth daily before breakfast. Take 1 tablet with 200mcg for a total dose of 250mcg daily     metoprolol  succinate (TOPROL -XL) 25 MG 24 hr tablet TAKE 1 TABLET(25 MG) BY MOUTH DAILY 90 tablet 0   ondansetron  (ZOFRAN -ODT) 4 MG disintegrating tablet  Take 1 tablet (4 mg total) by mouth every 8 (eight) hours as needed for nausea or vomiting. 20 tablet 0   OZEMPIC, 0.25 OR 0.5 MG/DOSE, 2 MG/3ML SOPN Inject 0.5 mg into the skin once a week. Fridays     sacubitril -valsartan  (ENTRESTO ) 49-51 MG Take 1 tablet by mouth 2 (two) times daily. 60 tablet 1   senna-docusate (SENOKOT-S) 8.6-50 MG tablet Take 2 tablets by mouth 2 (two) times daily. (Patient taking differently: Take 2 tablets by mouth at bedtime as needed for mild constipation.) 60 tablet 3   spironolactone  (ALDACTONE ) 25 MG tablet Take 0.5 tablets (12.5 mg total) by mouth daily.     VENTOLIN  HFA 108 (90 Base) MCG/ACT inhaler Inhale 1-2 puffs into the lungs every 6 (six) hours as needed for wheezing or shortness of breath. 18 g 1   No current facility-administered medications for this encounter.    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, he passed  Tobacco Use   Smoking status: Former    Current packs/day: 0.00    Types: Cigarettes    Quit date: 12/14/2005    Years since quitting: 18.6    Passive exposure: Never   Smokeless tobacco: Never   Tobacco comments:    QUIT IN 2005  Vaping Use   Vaping status: Never Used  Substance and Sexual Activity   Alcohol use: No   Drug use: No   Sexual activity: Never  Other Topics Concern   Not on file  Social History Narrative   Not on file   Social Drivers of Health   Financial Resource Strain: Low Risk  (05/19/2024)   Overall Financial Resource Strain  (CARDIA)    Difficulty of Paying Living Expenses: Not very hard  Food Insecurity: No Food Insecurity (05/19/2024)   Hunger Vital Sign    Worried About Running Out of Food in the Last Year: Never true    Ran Out of Food in the Last Year: Never true  Transportation Needs: Unmet Transportation Needs (05/19/2024)   PRAPARE - Transportation  Lack of Transportation (Medical): Yes    Lack of Transportation (Non-Medical): Yes  Physical Activity: Not on file  Stress: Not on file  Social Connections: Unknown (05/16/2024)   Social Connection and Isolation Panel    Frequency of Communication with Friends and Family: Three times a week    Frequency of Social Gatherings with Friends and Family: Once a week    Attends Religious Services: 1 to 4 times per year    Active Member of [employer] or Organizations: No    Attends Banker Meetings: Never    Marital Status: Patient declined  Catering manager Violence: Not At Risk (05/16/2024)   Humiliation, Afraid, Rape, and Kick questionnaire    Fear of Current or Ex-Partner: No    Emotionally Abused: No    Physically Abused: No    Sexually Abused: No   Family History  Problem Relation Age of Onset   Diabetes Mother    Hypertension Mother    Cancer Mother        pancreas   Hypertension Sister    Wt Readings from Last 3 Encounters:  08/01/24 (!) 153 kg (337 lb 6.4 oz)  06/26/24 (!) 155.8 kg (343 lb 7.6 oz)  06/05/24 (!) 158.6 kg (349 lb 10.4 oz)   BP (!) 150/84   Pulse 79   Ht 5' 5 (1.651 m)   Wt (!) 153 kg (337 lb 6.4 oz)   SpO2 97%   BMI 56.15 kg/m   PHYSICAL EXAM: General:  NAD. No resp difficulty, arrived in [facility], chronically-ill appearing. HEENT: Normal Neck: Supple. No JVD. Thick neck Cor: Regular rate & rhythm. No rubs, gallops or murmurs. Lungs: Clear, diminished in bases Abdomen: Soft, obese, nontender, nondistended.  Extremities: No cyanosis, clubbing, rash, trace BLE pre-tibial edema Neuro: Alert & oriented x 3, moves all 4  extremities w/o difficulty. Affect pleasant.  DATA REVIEW  ECG: 05/12/23: Normal sinus rhythm with left bundle branch block   ECHO: 6/25: EF 30-35%.  08/20/23: 30-35%, normal RV function   CATH: 09/11/23:  Severe multivessel CAD with 60% proximal LAD stenosis and 85% mid LAD stenosis; 80, 90 and 80%  OM1 stenosis of the left circumflex with 60% mid AV groove and 50% mid distal AV groove stenosis; dominant RCA with 30% proximal followed by 70% proximal stenosis, 90% mid stenosis, and diffuse mid distal PDA stenosis of at least 80%.   ASSESSMENT & PLAN:  Heart failure with reduced EF - iCM. Echo 08/2023: EF 30-35% RV normal. Echo 6/25: EF 30-35%.  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  NYHA class / AHA Stage: NYHA II, functional class confounded by body habitus and OA. Volume up a bit on exam, mild LEE. Volume status & Diuretics: Change Lasix  to 60 mg daily, discussed importance of taking daily. Vasodilators: Increase Entresto  to 97/103 mg bid Beta-Blocker: Continue Toprol  XL 25 mg daily.  MRA: Continue spironolactone  12.5 mg daily Cardiometabolic: Continue Jardiance  10 mg daily Devices therapies & Valvulopathies: N/A Advanced therapies: Not a candidate due to obesity.   - BMET, BNP and iron  panel. Repeat BMET in 2 weeks.  2. Pericardial effusion s/p window - Failed pericardiocentesis; s/p window in 12/2022 - Felt to be secondary to hypothyroidism.   3. CAD - No chest pain - Continue ASA - Continue atorvastatin  80 mg daily  4. CKD 3 - Baseline SCr 1.45-1.9 - Continue SGLT2i - BMET today.  5. Obesity - Continue Ozempic.  - Body mass index is 56.15 kg/m. -  Discussed portion control.   6. HTN - BP elevated - Increase Entresto  as above - She said she had a sleep study years ago at [facility], consider repeating  7. SDOH - Now followed by [facility]   Follow up with Dr. Gardenia, as scheduled.  Harlene HERO Fleurette Woolbright FNP-BC  11:35  AM

## 2024-07-31 ENCOUNTER — Telehealth (HOSPITAL_COMMUNITY): Payer: Self-pay

## 2024-07-31 NOTE — Telephone Encounter (Signed)
 Called to confirm/remind patient of their appointment at the [facility] on 08/01/24.   Appointment:   [x] Confirmed  [] Left mess   [] No answer/No voice mail  [] VM Full/unable to leave message  [] Phone not in service  Patient reminded to bring all medications and/or complete list.  Confirmed patient has transportation. Gave directions, instructed to utilize valet parking.

## 2024-08-01 ENCOUNTER — Ambulatory Visit (HOSPITAL_COMMUNITY)
Admission: RE | Admit: 2024-08-01 | Discharge: 2024-08-01 | Disposition: A | Discharge status: Home / Self Care | Source: Ambulatory Visit | Attending: Family Medicine | Admitting: Family Medicine

## 2024-08-01 ENCOUNTER — Encounter (HOSPITAL_COMMUNITY): Payer: Self-pay

## 2024-08-01 ENCOUNTER — Ambulatory Visit (HOSPITAL_COMMUNITY): Payer: Self-pay | Admitting: Family Medicine

## 2024-08-01 VITALS — BP 150/84 | HR 79 | Ht 65.0 in | Wt 337.4 lb

## 2024-08-01 DIAGNOSIS — Z5982 Transportation insecurity: Secondary | ICD-10-CM | POA: Insufficient documentation

## 2024-08-01 DIAGNOSIS — Z6841 Body Mass Index (BMI) 40.0 and over, adult: Secondary | ICD-10-CM | POA: Insufficient documentation

## 2024-08-01 DIAGNOSIS — Z7901 Long term (current) use of anticoagulants: Secondary | ICD-10-CM | POA: Diagnosis not present

## 2024-08-01 DIAGNOSIS — I2510 Atherosclerotic heart disease of native coronary artery without angina pectoris: Secondary | ICD-10-CM | POA: Insufficient documentation

## 2024-08-01 DIAGNOSIS — Z955 Presence of coronary angioplasty implant and graft: Secondary | ICD-10-CM | POA: Insufficient documentation

## 2024-08-01 DIAGNOSIS — Z139 Encounter for screening, unspecified: Secondary | ICD-10-CM

## 2024-08-01 DIAGNOSIS — I130 Hypertensive heart and chronic kidney disease with heart failure and stage 1 through stage 4 chronic kidney disease, or unspecified chronic kidney disease: Secondary | ICD-10-CM | POA: Insufficient documentation

## 2024-08-01 DIAGNOSIS — N183 Chronic kidney disease, stage 3 unspecified: Secondary | ICD-10-CM | POA: Insufficient documentation

## 2024-08-01 DIAGNOSIS — M170 Bilateral primary osteoarthritis of knee: Secondary | ICD-10-CM | POA: Diagnosis not present

## 2024-08-01 DIAGNOSIS — Z86718 Personal history of other venous thrombosis and embolism: Secondary | ICD-10-CM | POA: Diagnosis not present

## 2024-08-01 DIAGNOSIS — E039 Hypothyroidism, unspecified: Secondary | ICD-10-CM | POA: Insufficient documentation

## 2024-08-01 DIAGNOSIS — I428 Other cardiomyopathies: Secondary | ICD-10-CM | POA: Insufficient documentation

## 2024-08-01 DIAGNOSIS — Z7985 Long-term (current) use of injectable non-insulin antidiabetic drugs: Secondary | ICD-10-CM | POA: Insufficient documentation

## 2024-08-01 DIAGNOSIS — I5022 Chronic systolic (congestive) heart failure: Secondary | ICD-10-CM | POA: Diagnosis not present

## 2024-08-01 DIAGNOSIS — Z794 Long term (current) use of insulin: Secondary | ICD-10-CM | POA: Diagnosis not present

## 2024-08-01 DIAGNOSIS — Z79899 Other long term (current) drug therapy: Secondary | ICD-10-CM | POA: Diagnosis not present

## 2024-08-01 DIAGNOSIS — Z87891 Personal history of nicotine dependence: Secondary | ICD-10-CM | POA: Diagnosis not present

## 2024-08-01 DIAGNOSIS — Z7982 Long term (current) use of aspirin: Secondary | ICD-10-CM | POA: Insufficient documentation

## 2024-08-01 DIAGNOSIS — G4733 Obstructive sleep apnea (adult) (pediatric): Secondary | ICD-10-CM | POA: Insufficient documentation

## 2024-08-01 DIAGNOSIS — R262 Difficulty in walking, not elsewhere classified: Secondary | ICD-10-CM | POA: Insufficient documentation

## 2024-08-01 DIAGNOSIS — Z7984 Long term (current) use of oral hypoglycemic drugs: Secondary | ICD-10-CM | POA: Insufficient documentation

## 2024-08-01 DIAGNOSIS — I3139 Other pericardial effusion (noninflammatory): Secondary | ICD-10-CM | POA: Insufficient documentation

## 2024-08-01 DIAGNOSIS — I255 Ischemic cardiomyopathy: Secondary | ICD-10-CM | POA: Diagnosis not present

## 2024-08-01 DIAGNOSIS — I10 Essential (primary) hypertension: Secondary | ICD-10-CM

## 2024-08-01 DIAGNOSIS — E1122 Type 2 diabetes mellitus with diabetic chronic kidney disease: Secondary | ICD-10-CM | POA: Diagnosis not present

## 2024-08-01 LAB — BASIC METABOLIC PANEL WITH GFR
Anion gap: 7 (ref 5–15)
BUN: 24 mg/dL — ABNORMAL HIGH (ref 8–23)
CO2: 24 mmol/L (ref 22–32)
Calcium: 7.8 mg/dL — ABNORMAL LOW (ref 8.9–10.3)
Chloride: 108 mmol/L (ref 98–111)
Creatinine, Ser: 1.12 mg/dL — ABNORMAL HIGH (ref 0.44–1.00)
GFR, Estimated: 54 mL/min — ABNORMAL LOW (ref >60)
Glucose, Bld: 155 mg/dL — ABNORMAL HIGH (ref 70–99)
Potassium: 3.9 mmol/L (ref 3.5–5.1)
Sodium: 139 mmol/L (ref 135–145)

## 2024-08-01 LAB — CBC
HCT: 32.4 % — ABNORMAL LOW (ref 36.0–46.0)
Hemoglobin: 9.7 g/dL — ABNORMAL LOW (ref 12.0–15.0)
MCH: 25.1 pg — ABNORMAL LOW (ref 26.0–34.0)
MCHC: 29.9 g/dL — ABNORMAL LOW (ref 30.0–36.0)
MCV: 83.9 fL (ref 80.0–100.0)
Platelets: 276 K/uL (ref 150–400)
RBC: 3.86 MIL/uL — ABNORMAL LOW (ref 3.87–5.11)
RDW: 14.6 % (ref 11.5–15.5)
WBC: 6.7 K/uL (ref 4.0–10.5)
nRBC: 0 % (ref 0.0–0.2)

## 2024-08-01 LAB — IRON AND TIBC
Iron: 31 ug/dL (ref 28–170)
Saturation Ratios: 8 % — ABNORMAL LOW (ref 10.4–31.8)
TIBC: 386 ug/dL (ref 250–450)
UIBC: 355 ug/dL

## 2024-08-01 LAB — FERRITIN: Ferritin: 12 ng/mL (ref 11–307)

## 2024-08-01 LAB — BRAIN NATRIURETIC PEPTIDE: B Natriuretic Peptide: 256.1 pg/mL — ABNORMAL HIGH (ref 0.0–100.0)

## 2024-08-01 MED ORDER — FUROSEMIDE 40 MG PO TABS: 60.0000 mg | ORAL_TABLET | Freq: Every day | ORAL | 3 refills | Status: DC

## 2024-08-01 MED ORDER — SACUBITRIL-VALSARTAN 97-103 MG PO TABS: 1.0000 | ORAL_TABLET | Freq: Two times a day (BID) | ORAL | 3 refills | Status: DC

## 2024-08-01 NOTE — Patient Instructions (Signed)
 Medication Changes:  PLEASE TAKE LASIX  (FUROSEMIDE ) 60MG  EVERY DAY   INCREASE ENTRESTO  TO 97/103MG  TWICE DAILY   Lab Work:  Labs done today, your results will be available in MyChart, we will contact you for abnormal readings.  Follow-Up in:  AS SCHEDULED WITH DR. GARDENIA   At the [facility], you and your health needs are our priority. We have a designated team specialized in the treatment of Heart Failure. This Care Team includes your primary Heart Failure Specialized Cardiologist (physician), Advanced Practice Providers (APPs- Physician Assistants and Nurse Practitioners), and Pharmacist who all work together to provide you with the care you need, when you need it.   You may see any of the following providers on your designated Care Team at your next follow up:  Dr. Toribio Fuel Dr. Ezra Shuck Dr. Ria GARDENIA Dr. Odis Brownie Greig Mosses, NP Caffie Shed, [place] [facility] [place], [place] Beckey Coe, NP [place] Lee, NP Tinnie Redman, PharmD   Please be sure to bring in all your medications bottles to every appointment.   Need to Contact Us :  If you have any questions or concerns before your next appointment please send us  a message through [place] or call our office at [phone].    TO LEAVE A MESSAGE FOR THE NURSE SELECT OPTION 2, PLEASE LEAVE A MESSAGE INCLUDING: YOUR NAME DATE OF BIRTH CALL BACK NUMBER REASON FOR CALL**this is important as we prioritize the call backs  YOU WILL RECEIVE A CALL BACK THE SAME DAY AS LONG AS YOU CALL BEFORE 4:00 PM

## 2024-08-04 NOTE — Telephone Encounter (Signed)
Detailed message left per DPR 

## 2024-08-07 ENCOUNTER — Encounter (HOSPITAL_COMMUNITY): Payer: Self-pay

## 2024-08-07 NOTE — Telephone Encounter (Signed)
 Patient aware of results   reports during recent hospitalization she received iron  infusion. Reports right after she was very constipated, would like to know if something can be prescribed for constipation before proceeding with iron  infusion.  Currently takes senna-docusate

## 2024-08-12 ENCOUNTER — Encounter: Payer: Self-pay | Admitting: Nurse Practitioner

## 2024-08-12 ENCOUNTER — Ambulatory Visit: Attending: Nurse Practitioner | Admitting: Nurse Practitioner

## 2024-08-12 NOTE — Progress Notes (Deleted)
 Office Visit    Patient Name: Maureen Jackson Date of Encounter: 07/12/2023 PCP:  Orpha Yancey LABOR, MD [facility]  Cardiologist:  Alvan Carrier, MD Advanced Practice Provider:  Miriam Norris, NP Electrophysiologist:  None  Chief Complaint and HPI    Maureen Jackson is a 68 y.o. female with a hx of CAD, ischemic cardiomyopathy/chronic systolic CHF, pericardial effusion, s/p pericardial window 12/2022, PAD, hypertension, type 2 diabetes, OSA (does not wear CPAP), thyroid  disease, left bundle branch block, CKD, and morbid obesity, who presents today for 2-3 month follow-up.   Admitted with acute onset CHF in 2018.  TTE EF 30 to 35%, grade 1 DD.  Cath report noted below, noted to have severe MV CAD.  Evaluated by CT surgery, rec for PCI as opposed to CABG and received DES to LAD and DES x 2 to RCA, left circumflex disease medically managed.  In 2021 echo revealed EF 30%, moderate pericardial effusion.  In April 2023, estimated EF 40 to 50%.   Admitted 12/2022 with a large pericardial effusion, had unsuccessful pericardiocentesis and pericardial window performed.  HF meds decreased due to tamponade/low BPs. Followed in heart failure clinic.  Last seen by Dr. Carrier Alvan on January 31, 2023.  Was overall euvolemic on exam.  Repeat labs were obtained due to increase in Lasix .  Dr. Alvan recommended to monitor at this time, recommended titrating HF meds at future follow-up if BPs remain stable.  Was felt that pericardial effusion was likely secondary due to hypothyroidism.  Since I last saw patient 02/2023, she has had 2 ED visits. One ED visit 02/2023 for a fall. Was ambulating w/o assistance, landed on left knee. Denied hitting head or LOC. X-rays notable for effusion and OA but not fracture/dislocation. HH order placed along with referral for orthopedic surgery. ED visit 05/2023 for malaise shortly after taking NovoLog . High sensitivity trop 22, delta trop 35, felt  likely secondary to CHF and type 2 demand ischemia. CXR showed mild CHF, no overt pulmonary edema.   Today she presents for follow-up.  She is doing well.  Weight remains stable. Denies any chest pain or shortness of breath. Admits to dependent edema, but overall stable. Denies any palpitations, syncope, presyncope, dizziness, orthopnea, PND, significant weight changes, acute bleeding, or claudication. Would like to know if she has CKD as she was told by another provider that she has this condition.  EKGs/Labs/Other Studies Reviewed:   The following studies were reviewed today:  EKG:  EKG is not ordered today.    TTE limited 12/2022:   1. Large pericardial effusion up to 4.8 cm anterior to RV (subcostal) and  2.8 cm posterior to LV. The RV appears small and underfilled. There is  systolic collapse of the RA. The IVC is dilated. Overall, findings are  suspicious for cardiac tamponade, but   clinical correlation is recommended. Large pericardial effusion. The  pericardial effusion is posterior and lateral to the left ventricle and  anterior to the right ventricle.   2. Left ventricular ejection fraction, by estimation, is 30 to 35%. The  left ventricle has moderately decreased function. Left ventricular  endocardial border not optimally defined to evaluate regional wall motion.   3. Right ventricular systolic function is normal. The right ventricular  size is normal.   4. The inferior vena cava is dilated in size with >50% respiratory  variability, suggesting right atrial pressure of 8 mmHg.  Review of Systems    All other  systems reviewed and are otherwise negative except as noted above.  Physical Exam    VS:  There were no vitals taken for this visit. , BMI There is no height or weight on file to calculate BMI.  Wt Readings from Last 3 Encounters:  08/01/24 (!) 337 lb 6.4 oz (153 kg)  06/26/24 (!) 343 lb 7.6 oz (155.8 kg)  06/05/24 (!) 349 lb 10.4 oz (158.6 kg)     GEN:  Morbidly obese, 68 y.o. female in no acute distress. HEENT: normal. Neck: Supple, no JVD, carotid bruits, or masses. Cardiac: S1/S2, RRR, no murmurs, rubs, or gallops. No clubbing, cyanosis. Nonpitting, dependent edema along BLE.  Radials/PT 2+ and equal bilaterally.  Respiratory:  Respirations regular and unlabored, clear to auscultation bilaterally. MS: No deformity or atrophy. Skin: Warm and dry, no rash. Neuro:  Strength and sensation are intact. Psych: Normal affect.  Assessment & Plan   Chronic systolic CHF, ICM Stage C, NYHA class I-II symptoms. TTE 12/2022 revealed EF 30-35%. Wt is stable. Does not appear volume overloaded, overall euvolemic and well compensated on exam.  Continue current GDMT.  Low sodium diet, fluid restriction <2L, and daily weights encouraged. Educated to contact our office for weight gain of 2 lbs overnight or 5 lbs in one week. She is scheduled to repeat limited echo in a few days as mentioned below.   HTN Blood pressure stable.  Continue current medication regimen.   Discussed to monitor BP at home at least 2 hours after medications and sitting for 5-10 minutes. Heart healthy diet and regular cardiovascular exercise encouraged.   Pericardial effusion Status post pericardial window.  Doing well.  Will repeat limited echo in a few days to reevaluate for pericardial effusion.  CAD Stable with no anginal symptoms. No indication for ischemic evaluation.  Continue Lipitor , Plavix , and Toprol -XL. Heart healthy diet and regular cardiovascular exercise encouraged.   5. CKD stage 3a-3b Most recent labs revealed serum creatinine at 1.36 with eGFR at 43. No medication changes at this time.  Kidney function relatively stable over the last few months.  Did discuss nephrology referral for further evaluation and assistance with medication management, and patient is agreeable to this.  Will refer patient to nephrology. Avoid nephrotoxic agents. Continue to follow with  PCP.   Disposition: Follow up in 3-4 months with Alvan Carrier, MD or APP.  Signed, Almarie Crate, NP 08/12/2024, 8:44 AM [facility]

## 2024-08-14 NOTE — Progress Notes (Incomplete)
 ADVANCED HEART FAILURE CLINIC NOTE  Referring Physician: Orpha Yancey LABOR, MD  Primary Care: Orpha Yancey LABOR, MD Primary Cardiologist: Dr. Dorn Ross  CC: HFrEF  HPI: Maureen Jackson is a 68 y.o. 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   Her history of heart failure dates back to at least 2018.  Echocardiogram at that time with EF of 30 to 35% with left heart cath demonstrating severe multivessel coronary artery disease.  Patient underwent PCI as opposed to CABG with DES to the LAD, x 2 to the RCA and left circumflex disease managed medically.  In 2021 EF dropped to 30% with recovery to 40 to 50% in April 2023.  Patient underwent unsuccessful pericardiocentesis for a large effusion in January 2024 eventually requiring creation of a pericardial window.  Interval hx:  - During our appointment last week, patient noted to be significantly hypervolemic with +cough and some PND.  She has now been on Lasix  80 mg daily for the past 4 days.  Reports significant increase in urine output, however, reports that she has a strong urge to eat ice throughout the day and also frequently eats potato chips. -Otherwise her only complaint today is persistent cough that has been waxing and waning for the past 1 to 2 months.   Current Outpatient Medications  Medication Sig Dispense Refill   acetaminophen  (TYLENOL ) 325 MG tablet Take 2 tablets (650 mg total) by mouth every 4 (four) hours as needed for mild pain, fever or headache. 12 tablet 0   apixaban  (ELIQUIS ) 5 MG TABS tablet Take 1 tablet (5 mg total) by mouth 2 (two) times daily. 60 tablet 1   aspirin  EC 81 MG tablet Take 1 tablet (81 mg total) by mouth daily. Swallow whole.     atorvastatin  (LIPITOR ) 80 MG tablet TAKE 1 TABLET(80 MG) BY MOUTH DAILY 30 tablet 6   Blood Pressure Monitoring (OMRON 3 SERIES BP MONITOR) DEVI  Use as directed 1 each 1   calcium  carbonate (TUMS - DOSED IN MG ELEMENTAL CALCIUM ) 500 MG chewable tablet Chew 4 tablets by mouth 2 (two) times daily.     cetirizine  (ZYRTEC ) 10 MG tablet Take 10 mg by mouth daily as needed for allergies.   0   COMBIGAN  0.2-0.5 % ophthalmic solution Place 1 drop into both eyes 2 (two) times daily.     empagliflozin  (JARDIANCE ) 10 MG TABS tablet Take 1 tablet (10 mg total) by mouth daily. 30 tablet 1   furosemide  (LASIX ) 40 MG tablet Take 1.5 tablets (60 mg total) by mouth daily. 90 tablet 3   glucose blood (ONETOUCH VERIO) test strip Test glucose 4 times day. 150 each 2   insulin  aspart (NOVOLOG ) 100 UNIT/ML injection Inject 0-15 Units into the skin 3 (three) times daily with meals.     levothyroxine  (SYNTHROID ) 200 MCG tablet Take 200 mcg by mouth daily before breakfast. Take 1 tablet with 50mcg for a total dose of 250mcg daily     levothyroxine  (SYNTHROID ) 50 MCG tablet Take 50 mcg by mouth daily before breakfast. Take 1 tablet with 200mcg for a total dose of 250mcg daily     metoprolol  succinate (TOPROL -XL) 25 MG 24 hr tablet TAKE 1 TABLET(25 MG) BY MOUTH DAILY 90 tablet 0   ondansetron  (ZOFRAN -ODT) 4 MG disintegrating tablet Take 1 tablet (4 mg total) by mouth every 8 (  eight) hours as needed for nausea or vomiting. 20 tablet 0   OZEMPIC, 0.25 OR 0.5 MG/DOSE, 2 MG/3ML SOPN Inject 0.5 mg into the skin once a week. Fridays     sacubitril -valsartan  (ENTRESTO ) 97-103 MG Take 1 tablet by mouth 2 (two) times daily. 60 tablet 3   senna-docusate (SENOKOT-S) 8.6-50 MG tablet Take 2 tablets by mouth 2 (two) times daily. (Patient taking differently: Take 2 tablets by mouth at bedtime as needed for mild constipation.) 60 tablet 3   spironolactone  (ALDACTONE ) 25 MG tablet Take 0.5 tablets (12.5 mg total) by mouth daily.     VENTOLIN  HFA 108 (90 Base) MCG/ACT inhaler Inhale 1-2 puffs into the lungs every 6 (six) hours as needed for wheezing or shortness of breath. 18 g 1    No current facility-administered medications for this visit.       PHYSICAL EXAM: There were no vitals filed for this visit. GENERAL: NAD Lungs- *** CARDIAC:  JVP: *** cm          Normal rate with regular rhythm. *** murmur.  Pulses ***. *** edema.  ABDOMEN: Soft, non-tender, non-distended.  EXTREMITIES: Warm and well perfused.  NEUROLOGIC: No obvious FND    DATA REVIEW  ECG: 05/12/23: Normal sinus rhythm with left bundle branch block as per my personal interpretation  ECHO: 08/20/23: 30 to 35%, normal RV function as per my personal interpretation  CATH: 09/11/23:  Severe multivessel CAD with 60% proximal LAD stenosis and 85% mid LAD stenosis; 80, 90 and 80%  OM1 stenosis of the left circumflex with 60% mid AV groove and 50% mid distal AV groove stenosis; dominant RCA with 30% proximal followed by 70% proximal stenosis, 90% mid stenosis, and diffuse mid distal PDA stenosis of at least 80%.   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  NYHA class / AHA Stage:NYHA III Volume status & Diuretics: we increased lasix  to 80mg  daily for 5 days last week; she remains hypervolemic with weight still at 340lbs. We will continue lasix  80mg  daily for another 3-4 days and then go back down to 60mg  daily.  Vasodilators: Increase Entresto  to 97/103mg  BID; will provide samples today.  Beta-Blocker:hypervolemic; continue toprol  25mg  daily.  MRA:spironolactone  25mg  daily Cardiometabolic:jardiance  25mg  daily Devices therapies & Valvulopathies:N/A Advanced therapies:Not a candidate due to obesity.  She received her cardiac rehab referral and is planning to start within the next 1 to 2 weeks.   2. Pericardial effusion s/p window - Failed pericardiocentesis; s/p window in 12/2022 - Felt to be secondary to hypothyroidism.  -Normal heart sounds today.  3. CAD - continue lipitor , plavix  and toprol  XL.  -Stable, no chest  pain  4. CKD 3A-3B - continue jardiance  25mg  daily - BMP last week w/ sCr at 1.14.  - Will continue diuresis with repeat BMP in 3 weeks.   5. Obesity - She has been on Ozempic  for 1 year - There is no height or weight on file to calculate BMI. - Discussed importance of weight loss.  -She is planning to start cardiac rehab in the next 1 to 2 weeks.  Today we identified that she eats a lot of potato chips.  She is going to try to cut back.  6.  Dry cough -Possibly secondary to ARNI.  If it does not improve in the next 1 to 2 months with diuresis we will have to transition to losartan  or valsartan   7. Hypertension - Increase entresto   to 97/103mg  BID - Will likely need to transition to coreg  at follow up and add on another anti-hypertensive.  I spent *** minutes caring for this patient today including face to face time, ordering and reviewing labs, reviewing records from ***, seeing the patient, documenting in the record, and arranging follow ups.    Mosie Angus Advanced Heart Failure Mechanical Circulatory Support

## 2024-08-15 ENCOUNTER — Inpatient Hospital Stay (HOSPITAL_COMMUNITY)
Admission: RE | Admit: 2024-08-15 | Discharge: 2024-08-15 | Disposition: A | Discharge status: Home / Self Care | Source: Ambulatory Visit | Attending: Cardiology | Admitting: Cardiology

## 2024-08-18 ENCOUNTER — Telehealth: Payer: Self-pay | Admitting: Cardiology

## 2024-08-18 ENCOUNTER — Telehealth: Payer: Self-pay

## 2024-08-18 ENCOUNTER — Telehealth (HOSPITAL_COMMUNITY): Payer: Self-pay

## 2024-08-18 DIAGNOSIS — I5042 Chronic combined systolic (congestive) and diastolic (congestive) heart failure: Secondary | ICD-10-CM

## 2024-08-18 DIAGNOSIS — D509 Iron deficiency anemia, unspecified: Secondary | ICD-10-CM | POA: Insufficient documentation

## 2024-08-18 NOTE — Telephone Encounter (Signed)
 Patient referred to infusion pharmacy team for ambulatory infusion of IV iron .  Insurance - [employer] Site of care - Site of care: [facility] Dx code - D50.9/I50.42 IV Iron  Therapy - Feraheme  510 mg x 2 Infusion appointments - Scheduling team will schedule patient as soon as possible.   Thank you,  Norton Blush, PharmD, BCSCP Pharmacist [facility]

## 2024-08-18 NOTE — Telephone Encounter (Signed)
 Auth Submission: NO AUTH NEEDED Site of care: Site of care: [facility] Payer: [employer] dual complete Medication & CPT/J Code(s) submitted: Feraheme  (ferumoxytol ) [id number] Diagnosis Code:  Route of submission (phone, fax, portal): portal Phone # Fax # Auth type: Buy/Bill PB Units/visits requested: 510mg  x 2 doses Reference number: [id number] Approval from: 08/18/2024 to 12/10/24

## 2024-08-18 NOTE — Telephone Encounter (Signed)
-----   Message from [facility] Chantel J sent at 08/04/2024 11:27 AM EDT ----- Not sure if this was sent over  Iron  infusion PA

## 2024-08-21 NOTE — Telephone Encounter (Signed)
 Pt aware.

## 2024-08-28 DIAGNOSIS — H401133 Primary open-angle glaucoma, bilateral, severe stage: Secondary | ICD-10-CM | POA: Diagnosis not present

## 2024-08-28 DIAGNOSIS — H34832 Tributary (branch) retinal vein occlusion, left eye, with macular edema: Secondary | ICD-10-CM | POA: Diagnosis not present

## 2024-09-12 ENCOUNTER — Other Ambulatory Visit: Payer: Self-pay | Admitting: Cardiology

## 2024-09-14 ENCOUNTER — Emergency Department (HOSPITAL_COMMUNITY)

## 2024-09-14 ENCOUNTER — Other Ambulatory Visit: Payer: Self-pay

## 2024-09-14 ENCOUNTER — Encounter (HOSPITAL_COMMUNITY): Payer: Self-pay | Admitting: Emergency Medicine

## 2024-09-14 ENCOUNTER — Inpatient Hospital Stay (HOSPITAL_COMMUNITY)
Admission: EM | Admit: 2024-09-14 | Discharge: 2024-09-20 | DRG: 291 | Disposition: A | Discharge status: Home Health | Attending: Family Medicine | Admitting: Family Medicine

## 2024-09-14 DIAGNOSIS — I2510 Atherosclerotic heart disease of native coronary artery without angina pectoris: Secondary | ICD-10-CM | POA: Diagnosis present

## 2024-09-14 DIAGNOSIS — E782 Mixed hyperlipidemia: Secondary | ICD-10-CM | POA: Diagnosis not present

## 2024-09-14 DIAGNOSIS — E890 Postprocedural hypothyroidism: Secondary | ICD-10-CM | POA: Diagnosis not present

## 2024-09-14 DIAGNOSIS — S0502XA Injury of conjunctiva and corneal abrasion without foreign body, left eye, initial encounter: Secondary | ICD-10-CM | POA: Diagnosis present

## 2024-09-14 DIAGNOSIS — H1032 Unspecified acute conjunctivitis, left eye: Secondary | ICD-10-CM | POA: Diagnosis not present

## 2024-09-14 DIAGNOSIS — N179 Acute kidney failure, unspecified: Secondary | ICD-10-CM | POA: Diagnosis not present

## 2024-09-14 DIAGNOSIS — I509 Heart failure, unspecified: Secondary | ICD-10-CM | POA: Diagnosis not present

## 2024-09-14 DIAGNOSIS — Z7984 Long term (current) use of oral hypoglycemic drugs: Secondary | ICD-10-CM | POA: Diagnosis not present

## 2024-09-14 DIAGNOSIS — Z881 Allergy status to other antibiotic agents status: Secondary | ICD-10-CM

## 2024-09-14 DIAGNOSIS — R0602 Shortness of breath: Secondary | ICD-10-CM | POA: Diagnosis not present

## 2024-09-14 DIAGNOSIS — E1165 Type 2 diabetes mellitus with hyperglycemia: Secondary | ICD-10-CM | POA: Diagnosis not present

## 2024-09-14 DIAGNOSIS — Z7982 Long term (current) use of aspirin: Secondary | ICD-10-CM | POA: Diagnosis not present

## 2024-09-14 DIAGNOSIS — Z886 Allergy status to analgesic agent status: Secondary | ICD-10-CM

## 2024-09-14 DIAGNOSIS — N1832 Chronic kidney disease, stage 3b: Secondary | ICD-10-CM | POA: Diagnosis present

## 2024-09-14 DIAGNOSIS — Z743 Need for continuous supervision: Secondary | ICD-10-CM | POA: Diagnosis not present

## 2024-09-14 DIAGNOSIS — E039 Hypothyroidism, unspecified: Secondary | ICD-10-CM | POA: Diagnosis present

## 2024-09-14 DIAGNOSIS — Z833 Family history of diabetes mellitus: Secondary | ICD-10-CM

## 2024-09-14 DIAGNOSIS — Z87891 Personal history of nicotine dependence: Secondary | ICD-10-CM

## 2024-09-14 DIAGNOSIS — Z7901 Long term (current) use of anticoagulants: Secondary | ICD-10-CM | POA: Diagnosis not present

## 2024-09-14 DIAGNOSIS — I5043 Acute on chronic combined systolic (congestive) and diastolic (congestive) heart failure: Secondary | ICD-10-CM | POA: Diagnosis present

## 2024-09-14 DIAGNOSIS — Z79890 Hormone replacement therapy (postmenopausal): Secondary | ICD-10-CM

## 2024-09-14 DIAGNOSIS — X58XXXA Exposure to other specified factors, initial encounter: Secondary | ICD-10-CM | POA: Diagnosis present

## 2024-09-14 DIAGNOSIS — I255 Ischemic cardiomyopathy: Secondary | ICD-10-CM | POA: Diagnosis not present

## 2024-09-14 DIAGNOSIS — Z79899 Other long term (current) drug therapy: Secondary | ICD-10-CM | POA: Diagnosis not present

## 2024-09-14 DIAGNOSIS — R069 Unspecified abnormalities of breathing: Secondary | ICD-10-CM | POA: Diagnosis not present

## 2024-09-14 DIAGNOSIS — Z885 Allergy status to narcotic agent status: Secondary | ICD-10-CM

## 2024-09-14 DIAGNOSIS — E1122 Type 2 diabetes mellitus with diabetic chronic kidney disease: Secondary | ICD-10-CM | POA: Diagnosis not present

## 2024-09-14 DIAGNOSIS — Z555 Less than a high school diploma: Secondary | ICD-10-CM

## 2024-09-14 DIAGNOSIS — I959 Hypotension, unspecified: Secondary | ICD-10-CM | POA: Diagnosis not present

## 2024-09-14 DIAGNOSIS — M170 Bilateral primary osteoarthritis of knee: Secondary | ICD-10-CM | POA: Diagnosis present

## 2024-09-14 DIAGNOSIS — B9689 Other specified bacterial agents as the cause of diseases classified elsewhere: Secondary | ICD-10-CM | POA: Diagnosis not present

## 2024-09-14 DIAGNOSIS — R7989 Other specified abnormal findings of blood chemistry: Secondary | ICD-10-CM | POA: Diagnosis present

## 2024-09-14 DIAGNOSIS — Z794 Long term (current) use of insulin: Secondary | ICD-10-CM | POA: Diagnosis not present

## 2024-09-14 DIAGNOSIS — D649 Anemia, unspecified: Secondary | ICD-10-CM | POA: Diagnosis not present

## 2024-09-14 DIAGNOSIS — Z7985 Long-term (current) use of injectable non-insulin antidiabetic drugs: Secondary | ICD-10-CM | POA: Diagnosis not present

## 2024-09-14 DIAGNOSIS — I517 Cardiomegaly: Secondary | ICD-10-CM | POA: Diagnosis not present

## 2024-09-14 DIAGNOSIS — I3139 Other pericardial effusion (noninflammatory): Secondary | ICD-10-CM | POA: Diagnosis not present

## 2024-09-14 DIAGNOSIS — Z6841 Body Mass Index (BMI) 40.0 and over, adult: Secondary | ICD-10-CM

## 2024-09-14 DIAGNOSIS — I110 Hypertensive heart disease with heart failure: Secondary | ICD-10-CM | POA: Diagnosis not present

## 2024-09-14 DIAGNOSIS — R0600 Dyspnea, unspecified: Secondary | ICD-10-CM | POA: Diagnosis not present

## 2024-09-14 DIAGNOSIS — Z90710 Acquired absence of both cervix and uterus: Secondary | ICD-10-CM

## 2024-09-14 DIAGNOSIS — H109 Unspecified conjunctivitis: Secondary | ICD-10-CM | POA: Diagnosis not present

## 2024-09-14 DIAGNOSIS — R9431 Abnormal electrocardiogram [ECG] [EKG]: Secondary | ICD-10-CM | POA: Diagnosis present

## 2024-09-14 DIAGNOSIS — J811 Chronic pulmonary edema: Secondary | ICD-10-CM | POA: Diagnosis not present

## 2024-09-14 DIAGNOSIS — H409 Unspecified glaucoma: Secondary | ICD-10-CM | POA: Diagnosis present

## 2024-09-14 DIAGNOSIS — Z955 Presence of coronary angioplasty implant and graft: Secondary | ICD-10-CM

## 2024-09-14 DIAGNOSIS — Z8249 Family history of ischemic heart disease and other diseases of the circulatory system: Secondary | ICD-10-CM

## 2024-09-14 DIAGNOSIS — I130 Hypertensive heart and chronic kidney disease with heart failure and stage 1 through stage 4 chronic kidney disease, or unspecified chronic kidney disease: Principal | ICD-10-CM | POA: Diagnosis present

## 2024-09-14 DIAGNOSIS — Z86718 Personal history of other venous thrombosis and embolism: Secondary | ICD-10-CM

## 2024-09-14 DIAGNOSIS — I2489 Other forms of acute ischemic heart disease: Secondary | ICD-10-CM | POA: Diagnosis present

## 2024-09-14 LAB — BASIC METABOLIC PANEL WITH GFR
Anion gap: 8 (ref 5–15)
BUN: 29 mg/dL — ABNORMAL HIGH (ref 8–23)
CO2: 25 mmol/L (ref 22–32)
Calcium: 7.9 mg/dL — ABNORMAL LOW (ref 8.9–10.3)
Chloride: 107 mmol/L (ref 98–111)
Creatinine, Ser: 1.32 mg/dL — ABNORMAL HIGH (ref 0.44–1.00)
GFR, Estimated: 44 mL/min — ABNORMAL LOW (ref >60)
Glucose, Bld: 207 mg/dL — ABNORMAL HIGH (ref 70–99)
Potassium: 4.1 mmol/L (ref 3.5–5.1)
Sodium: 139 mmol/L (ref 135–145)

## 2024-09-14 LAB — CBC
HCT: 33.3 % — ABNORMAL LOW (ref 36.0–46.0)
Hemoglobin: 9.4 g/dL — ABNORMAL LOW (ref 12.0–15.0)
MCH: 23.9 pg — ABNORMAL LOW (ref 26.0–34.0)
MCHC: 28.2 g/dL — ABNORMAL LOW (ref 30.0–36.0)
MCV: 84.7 fL (ref 80.0–100.0)
Platelets: 270 K/uL (ref 150–400)
RBC: 3.93 MIL/uL (ref 3.87–5.11)
RDW: 15.2 % (ref 11.5–15.5)
WBC: 6.6 K/uL (ref 4.0–10.5)
nRBC: 0 % (ref 0.0–0.2)

## 2024-09-14 LAB — TROPONIN T, HIGH SENSITIVITY
Troponin T High Sensitivity: 44 ng/L — ABNORMAL HIGH (ref 0–19)
Troponin T High Sensitivity: 39 ng/L — ABNORMAL HIGH (ref 0–19)

## 2024-09-14 LAB — PRO BRAIN NATRIURETIC PEPTIDE: Pro Brain Natriuretic Peptide: 1543 pg/mL — ABNORMAL HIGH (ref <300.0)

## 2024-09-14 MED ORDER — MORPHINE SULFATE (PF) 4 MG/ML IV SOLN
4.0000 mg | Freq: Once | INTRAVENOUS | Status: AC
  Administered 2024-09-14: 4 mg via INTRAVENOUS
  Filled 2024-09-14: qty 1

## 2024-09-14 MED ORDER — TETRACAINE HCL 0.5 % OP SOLN
2.0000 [drp] | Freq: Once | OPHTHALMIC | Status: AC
  Administered 2024-09-14: 2 [drp] via OPHTHALMIC
  Filled 2024-09-14: qty 4

## 2024-09-14 MED ORDER — FUROSEMIDE 10 MG/ML IJ SOLN
20.0000 mg | Freq: Once | INTRAMUSCULAR | Status: AC
  Administered 2024-09-14: 20 mg via INTRAVENOUS
  Filled 2024-09-14: qty 2

## 2024-09-14 MED ORDER — FUROSEMIDE 10 MG/ML IJ SOLN
40.0000 mg | Freq: Once | INTRAMUSCULAR | Status: AC
  Administered 2024-09-14: 40 mg via INTRAVENOUS
  Filled 2024-09-14: qty 4

## 2024-09-14 MED ORDER — FLUORESCEIN SODIUM 1 MG OP STRP
1.0000 | ORAL_STRIP | Freq: Once | OPHTHALMIC | Status: AC
  Administered 2024-09-14: 1 via OPHTHALMIC
  Filled 2024-09-14: qty 1

## 2024-09-14 NOTE — ED Notes (Signed)
 Pt placed on 2L [place]  O2 on RA was 92% Now at 98%

## 2024-09-14 NOTE — ED Notes (Signed)
 Admitting provider bedside

## 2024-09-14 NOTE — ED Provider Notes (Incomplete)
  EMERGENCY DEPARTMENT AT [facility] Provider Note   CSN: [id number] Arrival date & time: 09/14/24  1552     Patient presents with: Shortness of Breath   Maureen Jackson is a 68 y.o. female with a history including hypertension, diabetes, CAD, history of glaucoma, CHF, thyroid  disease and sleep apnea presenting for evaluation of 2 complaints.  First she has had increased shortness of breath along with worsened orthopnea, concern for fluid retention.  She denies chest pain, denies pain or swelling in her lower extremities.  She has been compliant with her Lasix .  She also has spironolactone  listed on her medicine list but she is not familiar with this medicine.  Denies palpitations, no nausea or vomiting.  She also has complaint of worsening left eye pain today.  She was diagnosed with a blood clot behind her left eye  retina 2 weeks ago and is seeing a retinologist in 3 days to discuss surgical intervention.  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   {Add pertinent medical, surgical, social history, OB history to YEP:67052} The history is provided by the patient.       Prior to Admission medications   Medication Sig Start Date End Date Taking? Authorizing Provider  acetaminophen  (TYLENOL ) 325 MG tablet Take 2 tablets (650 mg total) by mouth every 4 (four) hours as needed for mild pain, fever or headache. 05/25/20   Pearlean Manus, MD  apixaban  (ELIQUIS ) 5 MG TABS tablet Take 1 tablet (5 mg total) by mouth 2 (two) times daily. 05/25/24   Evonnie Lenis, MD  aspirin  EC 81 MG tablet Take 1 tablet (81 mg total) by mouth daily. Swallow whole. 05/26/24   Evonnie Lenis, MD  atorvastatin  (LIPITOR ) 80 MG tablet TAKE 1 TABLET(80 MG) BY MOUTH DAILY 04/23/23   Alvan Dorn FALCON, MD  Blood Pressure  Monitoring (OMRON 3 SERIES BP MONITOR) DEVI Use as directed 08/27/23   Miriam Norris, NP  calcium  carbonate (TUMS - DOSED IN MG ELEMENTAL CALCIUM ) 500 MG chewable tablet Chew 4 tablets by mouth 2 (two) times daily.    [provider]  cetirizine  (ZYRTEC ) 10 MG tablet Take 10 mg by mouth daily as needed for allergies.  09/12/18   [provider]  COMBIGAN  0.2-0.5 % ophthalmic solution Place 1 drop into both eyes 2 (two) times daily. 02/08/22   [provider]  empagliflozin  (JARDIANCE ) 10 MG TABS tablet Take 1 tablet (10 mg total) by mouth daily. 05/26/24   Evonnie Lenis, MD  furosemide  (LASIX ) 40 MG tablet Take 1.5 tablets (60 mg total) by mouth daily. 08/01/24   Milford, Harlene HERO, FNP  glucose blood (ONETOUCH VERIO) test strip Test glucose 4 times day. 04/19/22   Nida, Gebreselassie W, MD  insulin  aspart (NOVOLOG ) 100 UNIT/ML injection Inject 0-15 Units into the skin 3 (three) times daily with meals. 01/02/23   Bryn Bernardino NOVAK, MD  levothyroxine  (SYNTHROID ) 200 MCG tablet Take 200 mcg by mouth daily before breakfast. Take 1 tablet with 50mcg for a total dose of 250mcg daily    [provider]  levothyroxine  (SYNTHROID ) 50 MCG tablet Take 50 mcg by mouth daily before breakfast. Take 1 tablet with 200mcg for a total dose of 250mcg daily    [provider]  metoprolol  succinate (TOPROL -XL)  25 MG 24 hr tablet TAKE 1 TABLET(25 MG) BY MOUTH DAILY 02/18/24   Miriam Norris, NP  ondansetron  (ZOFRAN -ODT) 4 MG disintegrating tablet Take 1 tablet (4 mg total) by mouth every 8 (eight) hours as needed for nausea or vomiting. 06/05/24   Kommor, Madison, MD  OZEMPIC, 0.25 OR 0.5 MG/DOSE, 2 MG/3ML SOPN Inject 0.5 mg into the skin once a week. Fridays 05/03/23   [provider]  sacubitril -valsartan  (ENTRESTO ) 97-103 MG Take 1 tablet by mouth 2 (two) times daily. 08/01/24   Milford, Harlene HERO, FNP  senna-docusate (SENOKOT-S) 8.6-50 MG tablet Take 2 tablets by mouth 2 (two)  times daily. Patient taking differently: Take 2 tablets by mouth at bedtime as needed for mild constipation. 05/25/20   Pearlean Manus, MD  spironolactone  (ALDACTONE ) 25 MG tablet Take 0.5 tablets (12.5 mg total) by mouth daily. 05/25/24   Evonnie Lenis, MD  VENTOLIN  HFA 108 (90 Base) MCG/ACT inhaler Inhale 1-2 puffs into the lungs every 6 (six) hours as needed for wheezing or shortness of breath. 05/25/20   Pearlean Manus, MD    Allergies: Ampicillin , Aspirin , Hydrocodone , and Unasyn  [ampicillin -sulbactam sodium ]    Review of Systems  Constitutional:  Negative for chills and fever.  HENT:  Negative for congestion and sore throat.   Eyes:  Positive for pain and redness.  Respiratory:  Positive for cough and shortness of breath. Negative for chest tightness.   Cardiovascular:  Negative for chest pain, palpitations and leg swelling.  Gastrointestinal:  Positive for nausea. Negative for abdominal pain and vomiting.  Genitourinary: Negative.   Musculoskeletal:  Negative for arthralgias, joint swelling and neck pain.  Skin: Negative.  Negative for rash and wound.  Neurological:  Negative for dizziness, weakness, light-headedness, numbness and headaches.  Psychiatric/Behavioral: Negative.      Updated Vital Signs BP (!) 152/76   Pulse 63   Temp 98.2 F (36.8 C) (Oral)   Resp 18   Ht 5' 5 (1.651 m)   Wt (!) 152.9 kg   SpO2 100%   BMI 56.08 kg/m   Physical Exam Vitals and nursing note reviewed.  Constitutional:      Appearance: She is well-developed.  HENT:     Head: Normocephalic and atraumatic.  Eyes:     Intraocular pressure: Left eye pressure is 8 mmHg. Measurements were taken using a handheld tonometer.    Extraocular Movements: Extraocular movements intact.     Conjunctiva/sclera:     Left eye: Left conjunctiva is injected. No chemosis or exudate.    Pupils: Pupils are equal, round, and reactive to light.     Left eye: No corneal abrasion or fluorescein uptake.     Slit  lamp exam:    Left eye: No corneal ulcer, hyphema or photophobia.     Comments: Small stye mid upper eyelid left.        Visual Acuity Bilateral Distance 20/25   R Distance 20/25   L Distance 20/200        Cardiovascular:     Rate and Rhythm: Normal rate and regular rhythm.     Heart sounds: Normal heart sounds.  Pulmonary:     Effort: Pulmonary effort is normal.     Breath sounds: Examination of the right-lower field reveals rales. Examination of the left-lower field reveals rales. Rales present. No wheezing.  Abdominal:     General: Bowel sounds are normal.     Palpations: Abdomen is soft.     Tenderness: There is no abdominal tenderness.  Musculoskeletal:        General: Normal range of motion.     Cervical back: Normal range of motion.  Skin:    General: Skin is warm and dry.  Neurological:     Mental Status: She is alert.     (all labs ordered are listed, but only abnormal results are displayed) Labs Reviewed  BASIC METABOLIC PANEL WITH GFR - Abnormal; Notable for the following components:      Result Value   Glucose, Bld 207 (*)    BUN 29 (*)    Creatinine, Ser 1.32 (*)    Calcium  7.9 (*)    GFR, Estimated 44 (*)    All other components within normal limits  CBC - Abnormal; Notable for the following components:   Hemoglobin 9.4 (*)    HCT 33.3 (*)    MCH 23.9 (*)    MCHC 28.2 (*)    All other components within normal limits  PRO BRAIN NATRIURETIC PEPTIDE - Abnormal; Notable for the following components:   Pro Brain Natriuretic Peptide 1,543.0 (*)    All other components within normal limits  TROPONIN T, HIGH SENSITIVITY - Abnormal; Notable for the following components:   Troponin T High Sensitivity 44 (*)    All other components within normal limits  TROPONIN T, HIGH SENSITIVITY - Abnormal; Notable for the following components:   Troponin T High Sensitivity 39 (*)    All other components within normal limits    EKG: EKG  Interpretation Date/Time:  Sunday September 14 2024 16:06:26 EDT Ventricular Rate:  67 PR Interval:  159 QRS Duration:  173 QT Interval:  482 QTC Calculation: 509 R Axis:   102  Text Interpretation: Sinus rhythm Nonspecific intraventricular conduction delay Consider anterior infarct Confirmed by Elnor Hila ([id number]) on 09/14/2024 4:22:14 PM  Radiology: DG Chest 2 View Result Date: 09/14/2024 CLINICAL DATA:  SOB EXAM: CHEST - 2 VIEW COMPARISON:  06/05/2024 FINDINGS: Mild diffuse interstitial opacities throughout both lungs. No focal airspace consolidation, pleural effusion, or pneumothorax. Moderate cardiomegaly. No acute fracture or destructive lesions. Multilevel thoracic osteophytosis. Surgical clips in the lower neck. IMPRESSION: Moderate cardiomegaly with findings of interstitial edema. Electronically Signed   By: Rogelia Myers M.D.   On: 09/14/2024 16:58    {Document cardiac monitor, telemetry assessment procedure when appropriate:32947} Procedures   Medications Ordered in the ED  ofloxacin (OCUFLOX) 0.3 % ophthalmic solution 1 drop (has no administration in time range)  erythromycin ophthalmic ointment (has no administration in time range)  artificial tears ophthalmic solution 1 drop (has no administration in time range)  tetracaine (PONTOCAINE) 0.5 % ophthalmic solution 2 drop (2 drops Left Eye Given 09/14/24 2059)  furosemide  (LASIX ) injection 40 mg (40 mg Intravenous Given 09/14/24 1955)  fluorescein ophthalmic strip 1 strip (1 strip Left Eye Given 09/14/24 2059)  furosemide  (LASIX ) injection 20 mg (20 mg Intravenous Given 09/14/24 2059)  morphine  (PF) 4 MG/ML injection 4 mg (4 mg Intravenous Given 09/14/24 2342)      {Click here for ABCD2, HEART and other calculators REFRESH Note before signing:1}                              Medical Decision Making Patient presenting with increased shortness of breath and concern for exacerbation of her CHF.  She is on Lasix  40 mg daily, she  denies chest pain.  Differential diagnosis including CHF exacerbation, pneumonia, viral respiratory infection, ACS, PE although  she denies pleuritic pain and has no chest pain.  Labs and imaging as outlined below.  Patient was given Lasix  IV 40 mg, after 45 minutes she had not started to diurese, given another 20 mg and she is currently diuresing.  She would benefit from admission for further management of this condition.  She also has complaint of left eye pain, unclear etiology although she does have some conjunctival injection but no discharge.  Her pressures in that eye are reassuring.  No corneal abrasion.  She gets relief when tetracaine is applied suggesting this is a conjunctival or surface pain, not a deeper structural pain.  She is scheduled to see retinologist in [place] in 3 days for what sounds like a possible blood clot behind her retina, but patient is unsure of this diagnosis, I can find no documentation in her chart regarding this condition.  Amount and/or Complexity of Data Reviewed Labs: ordered. Radiology: ordered.  Risk Prescription drug management.     {Document critical care time when appropriate  Document review of labs and clinical decision tools ie CHADS2VASC2, etc  Document your independent review of radiology images and any outside records  Document your discussion with family members, caretakers and with consultants  Document social determinants of health affecting pt's care  Document your decision making why or why not admission, treatments were needed:32947:::1}   Final diagnoses:  None    ED Discharge Orders     None

## 2024-09-14 NOTE — ED Triage Notes (Addendum)
 Pt to ed via [facility] from home c/o SOB with hx of CHF. 1 mos ago pt changed from plavix  to eliquis . Pt seen last week for blood behind left eye at PCP. Pt endorses nausea   Per ems,  RA 99 CBG 238

## 2024-09-14 NOTE — ED Provider Notes (Signed)
 [place] EMERGENCY DEPARTMENT AT [facility] Provider Note   CSN: [id number] Arrival date & time: 09/14/24  1552     Patient presents with: Shortness of Breath   Maureen Jackson is a 68 y.o. female with a history including hypertension, diabetes, CAD, history of glaucoma, CHF, thyroid  disease and sleep apnea presenting for evaluation of 2 complaints.  First she has had increased shortness of breath along with worsened orthopnea, concern for fluid retention.  She denies chest pain, denies pain or swelling in her lower extremities.  She has been compliant with her Lasix .  She also has spironolactone  listed on her medicine list but she is not familiar with this medicine.  Denies palpitations, no nausea or vomiting.  She also has complaint of worsening left eye pain today.  She was diagnosed with a blood clot behind her left eye 2 weeks ago and is seeing a retinologist in 3 days to discuss surgical intervention.  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  {Add pertinent medical, surgical, social history, OB history to YEP:67052} The history is provided by the patient.       Prior to Admission medications   Medication Sig Start Date End Date Taking? Authorizing Provider  acetaminophen  (TYLENOL ) 325 MG tablet Take 2 tablets (650 mg total) by mouth every 4 (four) hours as needed for mild pain, fever or headache. 05/25/20   Pearlean Manus, MD  apixaban  (ELIQUIS ) 5 MG TABS tablet Take 1 tablet (5 mg total) by mouth 2 (two) times daily. 05/25/24   Evonnie Lenis, MD  aspirin  EC 81 MG tablet Take 1 tablet (81 mg total) by mouth daily. Swallow whole. 05/26/24   Evonnie Lenis, MD  atorvastatin  (LIPITOR ) 80 MG tablet TAKE 1 TABLET(80 MG) BY MOUTH DAILY 04/23/23   Alvan Dorn FALCON, MD  Blood Pressure Monitoring (OMRON 3 SERIES BP MONITOR) DEVI Use as directed 08/27/23    Miriam Norris, NP  calcium  carbonate (TUMS - DOSED IN MG ELEMENTAL CALCIUM ) 500 MG chewable tablet Chew 4 tablets by mouth 2 (two) times daily.    [provider]  cetirizine  (ZYRTEC ) 10 MG tablet Take 10 mg by mouth daily as needed for allergies.  09/12/18   [provider]  COMBIGAN  0.2-0.5 % ophthalmic solution Place 1 drop into both eyes 2 (two) times daily. 02/08/22   [provider]  empagliflozin  (JARDIANCE ) 10 MG TABS tablet Take 1 tablet (10 mg total) by mouth daily. 05/26/24   Evonnie Lenis, MD  furosemide  (LASIX ) 40 MG tablet Take 1.5 tablets (60 mg total) by mouth daily. 08/01/24   Milford, Harlene HERO, FNP  glucose blood (ONETOUCH VERIO) test strip Test glucose 4 times day. 04/19/22   Nida, Gebreselassie W, MD  insulin  aspart (NOVOLOG ) 100 UNIT/ML injection Inject 0-15 Units into the skin 3 (three) times daily with meals. 01/02/23   Bryn Bernardino NOVAK, MD  levothyroxine  (SYNTHROID ) 200 MCG tablet Take 200 mcg by mouth daily before breakfast. Take 1 tablet with 50mcg for a total dose of 250mcg daily    [provider]  levothyroxine  (SYNTHROID ) 50 MCG tablet Take 50 mcg by mouth daily before breakfast. Take 1 tablet with 200mcg for a total dose of 250mcg daily    [provider]  metoprolol  succinate (TOPROL -XL) 25 MG 24 hr tablet TAKE 1 TABLET(25 MG) BY MOUTH DAILY 02/18/24  Miriam Norris, NP  ondansetron  (ZOFRAN -ODT) 4 MG disintegrating tablet Take 1 tablet (4 mg total) by mouth every 8 (eight) hours as needed for nausea or vomiting. 06/05/24   Kommor, Madison, MD  OZEMPIC, 0.25 OR 0.5 MG/DOSE, 2 MG/3ML SOPN Inject 0.5 mg into the skin once a week. Fridays 05/03/23   [provider]  sacubitril -valsartan  (ENTRESTO ) 97-103 MG Take 1 tablet by mouth 2 (two) times daily. 08/01/24   Milford, Harlene HERO, FNP  senna-docusate (SENOKOT-S) 8.6-50 MG tablet Take 2 tablets by mouth 2 (two) times daily. Patient taking differently: Take 2 tablets by mouth at  bedtime as needed for mild constipation. 05/25/20   Pearlean Manus, MD  spironolactone  (ALDACTONE ) 25 MG tablet Take 0.5 tablets (12.5 mg total) by mouth daily. 05/25/24   Evonnie Lenis, MD  VENTOLIN  HFA 108 (90 Base) MCG/ACT inhaler Inhale 1-2 puffs into the lungs every 6 (six) hours as needed for wheezing or shortness of breath. 05/25/20   Pearlean Manus, MD    Allergies: Ampicillin , Aspirin , Hydrocodone , and Unasyn  [ampicillin -sulbactam sodium ]    Review of Systems  Updated Vital Signs BP (!) 159/88 (BP Location: Left Wrist)   Pulse 65   Temp 98.2 F (36.8 C) (Oral)   Resp 14   Ht 5' 5 (1.651 m)   Wt (!) 152.9 kg   SpO2 100%   BMI 56.08 kg/m   Physical Exam  (all labs ordered are listed, but only abnormal results are displayed) Labs Reviewed  BASIC METABOLIC PANEL WITH GFR - Abnormal; Notable for the following components:      Result Value   Glucose, Bld 207 (*)    BUN 29 (*)    Creatinine, Ser 1.32 (*)    Calcium  7.9 (*)    GFR, Estimated 44 (*)    All other components within normal limits  CBC - Abnormal; Notable for the following components:   Hemoglobin 9.4 (*)    HCT 33.3 (*)    MCH 23.9 (*)    MCHC 28.2 (*)    All other components within normal limits    EKG: EKG Interpretation Date/Time:  Sunday September 14 2024 16:06:26 EDT Ventricular Rate:  67 PR Interval:  159 QRS Duration:  173 QT Interval:  482 QTC Calculation: 509 R Axis:   102  Text Interpretation: Sinus rhythm Nonspecific intraventricular conduction delay Consider anterior infarct Confirmed by Elnor Hila ([id number]) on 09/14/2024 4:22:14 PM  Radiology: DG Chest 2 View Result Date: 09/14/2024 CLINICAL DATA:  SOB EXAM: CHEST - 2 VIEW COMPARISON:  06/05/2024 FINDINGS: Mild diffuse interstitial opacities throughout both lungs. No focal airspace consolidation, pleural effusion, or pneumothorax. Moderate cardiomegaly. No acute fracture or destructive lesions. Multilevel thoracic osteophytosis. Surgical  clips in the lower neck. IMPRESSION: Moderate cardiomegaly with findings of interstitial edema. Electronically Signed   By: Rogelia Myers M.D.   On: 09/14/2024 16:58    {Document cardiac monitor, telemetry assessment procedure when appropriate:32947} Procedures   Medications Ordered in the ED - No data to display    {Click here for ABCD2, HEART and other calculators REFRESH Note before signing:1}                              Medical Decision Making Amount and/or Complexity of Data Reviewed Labs: ordered. Radiology: ordered.   ***  {Document critical care time when appropriate  Document review of labs and clinical decision tools ie CHADS2VASC2, etc  Document your independent review  of radiology images and any outside records  Document your discussion with family members, caretakers and with consultants  Document social determinants of health affecting pt's care  Document your decision making why or why not admission, treatments were needed:32947:::1}   Final diagnoses:  None    ED Discharge Orders     None

## 2024-09-15 ENCOUNTER — Other Ambulatory Visit: Payer: Self-pay | Admitting: Cardiology

## 2024-09-15 ENCOUNTER — Encounter (HOSPITAL_COMMUNITY): Payer: Self-pay | Admitting: Internal Medicine

## 2024-09-15 ENCOUNTER — Other Ambulatory Visit (HOSPITAL_COMMUNITY): Payer: Self-pay | Admitting: *Deleted

## 2024-09-15 ENCOUNTER — Inpatient Hospital Stay (HOSPITAL_COMMUNITY)

## 2024-09-15 DIAGNOSIS — Z8249 Family history of ischemic heart disease and other diseases of the circulatory system: Secondary | ICD-10-CM | POA: Diagnosis not present

## 2024-09-15 DIAGNOSIS — I10 Essential (primary) hypertension: Secondary | ICD-10-CM | POA: Diagnosis not present

## 2024-09-15 DIAGNOSIS — I5023 Acute on chronic systolic (congestive) heart failure: Secondary | ICD-10-CM

## 2024-09-15 DIAGNOSIS — Z7985 Long-term (current) use of injectable non-insulin antidiabetic drugs: Secondary | ICD-10-CM | POA: Diagnosis not present

## 2024-09-15 DIAGNOSIS — R9431 Abnormal electrocardiogram [ECG] [EKG]: Secondary | ICD-10-CM | POA: Insufficient documentation

## 2024-09-15 DIAGNOSIS — I130 Hypertensive heart and chronic kidney disease with heart failure and stage 1 through stage 4 chronic kidney disease, or unspecified chronic kidney disease: Secondary | ICD-10-CM | POA: Diagnosis present

## 2024-09-15 DIAGNOSIS — Z7984 Long term (current) use of oral hypoglycemic drugs: Secondary | ICD-10-CM | POA: Diagnosis not present

## 2024-09-15 DIAGNOSIS — E039 Hypothyroidism, unspecified: Secondary | ICD-10-CM | POA: Diagnosis not present

## 2024-09-15 DIAGNOSIS — N183 Chronic kidney disease, stage 3 unspecified: Secondary | ICD-10-CM | POA: Diagnosis not present

## 2024-09-15 DIAGNOSIS — I509 Heart failure, unspecified: Secondary | ICD-10-CM | POA: Diagnosis present

## 2024-09-15 DIAGNOSIS — N179 Acute kidney failure, unspecified: Secondary | ICD-10-CM | POA: Diagnosis not present

## 2024-09-15 DIAGNOSIS — Z79890 Hormone replacement therapy (postmenopausal): Secondary | ICD-10-CM | POA: Diagnosis not present

## 2024-09-15 DIAGNOSIS — I255 Ischemic cardiomyopathy: Secondary | ICD-10-CM | POA: Diagnosis present

## 2024-09-15 DIAGNOSIS — R7989 Other specified abnormal findings of blood chemistry: Secondary | ICD-10-CM

## 2024-09-15 DIAGNOSIS — Z6841 Body Mass Index (BMI) 40.0 and over, adult: Secondary | ICD-10-CM | POA: Diagnosis not present

## 2024-09-15 DIAGNOSIS — I3139 Other pericardial effusion (noninflammatory): Secondary | ICD-10-CM | POA: Diagnosis present

## 2024-09-15 DIAGNOSIS — E1165 Type 2 diabetes mellitus with hyperglycemia: Secondary | ICD-10-CM | POA: Insufficient documentation

## 2024-09-15 DIAGNOSIS — N1832 Chronic kidney disease, stage 3b: Secondary | ICD-10-CM | POA: Insufficient documentation

## 2024-09-15 DIAGNOSIS — X58XXXA Exposure to other specified factors, initial encounter: Secondary | ICD-10-CM | POA: Diagnosis present

## 2024-09-15 DIAGNOSIS — I5021 Acute systolic (congestive) heart failure: Secondary | ICD-10-CM | POA: Diagnosis not present

## 2024-09-15 DIAGNOSIS — Z7901 Long term (current) use of anticoagulants: Secondary | ICD-10-CM | POA: Diagnosis not present

## 2024-09-15 DIAGNOSIS — E785 Hyperlipidemia, unspecified: Secondary | ICD-10-CM

## 2024-09-15 DIAGNOSIS — H1032 Unspecified acute conjunctivitis, left eye: Secondary | ICD-10-CM | POA: Insufficient documentation

## 2024-09-15 DIAGNOSIS — Z7982 Long term (current) use of aspirin: Secondary | ICD-10-CM | POA: Diagnosis not present

## 2024-09-15 DIAGNOSIS — E890 Postprocedural hypothyroidism: Secondary | ICD-10-CM | POA: Diagnosis not present

## 2024-09-15 DIAGNOSIS — I5043 Acute on chronic combined systolic (congestive) and diastolic (congestive) heart failure: Secondary | ICD-10-CM | POA: Diagnosis not present

## 2024-09-15 DIAGNOSIS — I2510 Atherosclerotic heart disease of native coronary artery without angina pectoris: Secondary | ICD-10-CM | POA: Diagnosis not present

## 2024-09-15 DIAGNOSIS — I2489 Other forms of acute ischemic heart disease: Secondary | ICD-10-CM | POA: Diagnosis present

## 2024-09-15 DIAGNOSIS — Z79899 Other long term (current) drug therapy: Secondary | ICD-10-CM | POA: Diagnosis not present

## 2024-09-15 DIAGNOSIS — E782 Mixed hyperlipidemia: Secondary | ICD-10-CM | POA: Diagnosis present

## 2024-09-15 DIAGNOSIS — Z794 Long term (current) use of insulin: Secondary | ICD-10-CM | POA: Diagnosis not present

## 2024-09-15 DIAGNOSIS — E1122 Type 2 diabetes mellitus with diabetic chronic kidney disease: Secondary | ICD-10-CM | POA: Diagnosis present

## 2024-09-15 DIAGNOSIS — D649 Anemia, unspecified: Secondary | ICD-10-CM | POA: Diagnosis present

## 2024-09-15 DIAGNOSIS — Z86718 Personal history of other venous thrombosis and embolism: Secondary | ICD-10-CM | POA: Diagnosis not present

## 2024-09-15 LAB — CBC
HCT: 35.1 % — ABNORMAL LOW (ref 36.0–46.0)
Hemoglobin: 10.1 g/dL — ABNORMAL LOW (ref 12.0–15.0)
MCH: 24.3 pg — ABNORMAL LOW (ref 26.0–34.0)
MCHC: 28.8 g/dL — ABNORMAL LOW (ref 30.0–36.0)
MCV: 84.4 fL (ref 80.0–100.0)
Platelets: 202 K/uL (ref 150–400)
RBC: 4.16 MIL/uL (ref 3.87–5.11)
RDW: 15.5 % (ref 11.5–15.5)
WBC: 7.4 K/uL (ref 4.0–10.5)
nRBC: 0 % (ref 0.0–0.2)

## 2024-09-15 LAB — GLUCOSE, CAPILLARY
Glucose-Capillary: 110 mg/dL — ABNORMAL HIGH (ref 70–99)
Glucose-Capillary: 142 mg/dL — ABNORMAL HIGH (ref 70–99)
Glucose-Capillary: 162 mg/dL — ABNORMAL HIGH (ref 70–99)
Glucose-Capillary: 202 mg/dL — ABNORMAL HIGH (ref 70–99)

## 2024-09-15 LAB — COMPREHENSIVE METABOLIC PANEL WITH GFR
ALT: 25 U/L (ref 0–44)
AST: 28 U/L (ref 15–41)
Albumin: 3.4 g/dL — ABNORMAL LOW (ref 3.5–5.0)
Alkaline Phosphatase: 295 U/L — ABNORMAL HIGH (ref 38–126)
Anion gap: 10 (ref 5–15)
BUN: 29 mg/dL — ABNORMAL HIGH (ref 8–23)
CO2: 25 mmol/L (ref 22–32)
Calcium: 8 mg/dL — ABNORMAL LOW (ref 8.9–10.3)
Chloride: 108 mmol/L (ref 98–111)
Creatinine, Ser: 1.19 mg/dL — ABNORMAL HIGH (ref 0.44–1.00)
GFR, Estimated: 50 mL/min — ABNORMAL LOW (ref >60)
Glucose, Bld: 118 mg/dL — ABNORMAL HIGH (ref 70–99)
Potassium: 4.3 mmol/L (ref 3.5–5.1)
Sodium: 143 mmol/L (ref 135–145)
Total Bilirubin: 0.7 mg/dL (ref 0.0–1.2)
Total Protein: 7.2 g/dL (ref 6.5–8.1)

## 2024-09-15 LAB — ECHOCARDIOGRAM COMPLETE
Area-P 1/2: 2.56 cm2
Height: 65 in
S' Lateral: 4.8 cm
Weight: 5276.93 [oz_av]

## 2024-09-15 LAB — MAGNESIUM: Magnesium: 2.5 mg/dL — ABNORMAL HIGH (ref 1.7–2.4)

## 2024-09-15 LAB — PHOSPHORUS: Phosphorus: 4.2 mg/dL (ref 2.5–4.6)

## 2024-09-15 MED ORDER — INSULIN GLARGINE-YFGN 100 UNIT/ML ~~LOC~~ SOLN: 15.0000 [IU] | Freq: Every day | SUBCUTANEOUS | Status: DC

## 2024-09-15 MED ORDER — HYDROXYZINE HCL 25 MG PO TABS
25.0000 mg | ORAL_TABLET | Freq: Three times a day (TID) | ORAL | Status: DC | PRN
  Administered 2024-09-15 – 2024-09-19 (×2): 25 mg via ORAL
  Filled 2024-09-15 (×2): qty 1

## 2024-09-15 MED ORDER — LEVOTHYROXINE SODIUM 50 MCG PO TABS
50.0000 ug | ORAL_TABLET | Freq: Every day | ORAL | Status: DC
  Administered 2024-09-16 – 2024-09-18 (×3): 50 ug via ORAL
  Filled 2024-09-15 (×3): qty 1

## 2024-09-15 MED ORDER — SACUBITRIL-VALSARTAN 97-103 MG PO TABS
1.0000 | ORAL_TABLET | Freq: Two times a day (BID) | ORAL | Status: DC
  Administered 2024-09-15 – 2024-09-17 (×6): 1 via ORAL
  Filled 2024-09-15 (×6): qty 1

## 2024-09-15 MED ORDER — EMPAGLIFLOZIN 10 MG PO TABS
10.0000 mg | ORAL_TABLET | Freq: Every day | ORAL | Status: DC
  Administered 2024-09-15 – 2024-09-17 (×3): 10 mg via ORAL
  Filled 2024-09-15 (×3): qty 1

## 2024-09-15 MED ORDER — METOPROLOL SUCCINATE ER 25 MG PO TB24
25.0000 mg | ORAL_TABLET | Freq: Every day | ORAL | Status: DC
  Administered 2024-09-15 – 2024-09-20 (×6): 25 mg via ORAL
  Filled 2024-09-15 (×6): qty 1

## 2024-09-15 MED ORDER — OFLOXACIN 0.3 % OP SOLN
1.0000 [drp] | Freq: Four times a day (QID) | OPHTHALMIC | Status: DC
  Administered 2024-09-15 – 2024-09-20 (×18): 1 [drp] via OPHTHALMIC
  Filled 2024-09-15 (×2): qty 5

## 2024-09-15 MED ORDER — INSULIN ASPART 100 UNIT/ML IJ SOLN
0.0000 [IU] | Freq: Every day | INTRAMUSCULAR | Status: DC
  Administered 2024-09-15: 2 [IU] via SUBCUTANEOUS

## 2024-09-15 MED ORDER — ERYTHROMYCIN 5 MG/GM OP OINT: TOPICAL_OINTMENT | Freq: Once | OPHTHALMIC | Status: DC

## 2024-09-15 MED ORDER — PROCHLORPERAZINE EDISYLATE 10 MG/2ML IJ SOLN
10.0000 mg | Freq: Four times a day (QID) | INTRAMUSCULAR | Status: DC | PRN
  Administered 2024-09-15: 10 mg via INTRAVENOUS
  Filled 2024-09-15: qty 2

## 2024-09-15 MED ORDER — LEVOTHYROXINE SODIUM 100 MCG PO TABS
200.0000 ug | ORAL_TABLET | Freq: Every day | ORAL | Status: DC
  Administered 2024-09-16 – 2024-09-20 (×5): 200 ug via ORAL
  Filled 2024-09-15 (×5): qty 2

## 2024-09-15 MED ORDER — POLYVINYL ALCOHOL 1.4 % OP SOLN: 1.0000 [drp] | OPHTHALMIC | Status: DC | PRN

## 2024-09-15 MED ORDER — ACETAMINOPHEN 650 MG RE SUPP: 650.0000 mg | Freq: Four times a day (QID) | RECTAL | Status: DC | PRN

## 2024-09-15 MED ORDER — ACETAMINOPHEN 325 MG PO TABS
650.0000 mg | ORAL_TABLET | Freq: Four times a day (QID) | ORAL | Status: DC | PRN
  Administered 2024-09-15 – 2024-09-18 (×4): 650 mg via ORAL
  Filled 2024-09-15 (×5): qty 2

## 2024-09-15 MED ORDER — PERFLUTREN LIPID MICROSPHERE
1.0000 mL | INTRAVENOUS | Status: AC | PRN
  Administered 2024-09-15: 4 mL via INTRAVENOUS

## 2024-09-15 MED ORDER — INSULIN GLARGINE 100 UNIT/ML ~~LOC~~ SOLN
15.0000 [IU] | Freq: Every day | SUBCUTANEOUS | Status: DC
Start: 2024-09-15 — End: 2024-09-20
  Administered 2024-09-15 – 2024-09-19 (×5): 15 [IU] via SUBCUTANEOUS
  Filled 2024-09-15 (×6): qty 0.15

## 2024-09-15 MED ORDER — APIXABAN 5 MG PO TABS
5.0000 mg | ORAL_TABLET | Freq: Two times a day (BID) | ORAL | Status: DC
  Administered 2024-09-15 – 2024-09-20 (×12): 5 mg via ORAL
  Filled 2024-09-15 (×12): qty 1

## 2024-09-15 MED ORDER — FUROSEMIDE 10 MG/ML IJ SOLN
40.0000 mg | Freq: Two times a day (BID) | INTRAMUSCULAR | Status: DC
  Administered 2024-09-15 – 2024-09-17 (×5): 40 mg via INTRAVENOUS
  Filled 2024-09-15 (×5): qty 4

## 2024-09-15 MED ORDER — INSULIN ASPART 100 UNIT/ML IJ SOLN
0.0000 [IU] | Freq: Three times a day (TID) | INTRAMUSCULAR | Status: DC
  Administered 2024-09-15 – 2024-09-16 (×3): 2 [IU] via SUBCUTANEOUS
  Administered 2024-09-16 – 2024-09-20 (×12): 3 [IU] via SUBCUTANEOUS

## 2024-09-15 MED ORDER — ATORVASTATIN CALCIUM 40 MG PO TABS
80.0000 mg | ORAL_TABLET | Freq: Every day | ORAL | Status: DC
  Administered 2024-09-15 – 2024-09-20 (×6): 80 mg via ORAL
  Filled 2024-09-15 (×6): qty 2

## 2024-09-15 NOTE — Progress Notes (Unsigned)
 Patient eating lunch. Will recheck later.                                  Aida Pizza, RCS

## 2024-09-15 NOTE — Plan of Care (Signed)

## 2024-09-15 NOTE — H&P (Addendum)
 History and Physical    Patient: Maureen Jackson FMW:[id number] DOB: May 24, 1956 DOA: 09/14/2024 DOS: the patient was seen and examined on 09/15/2024 PCP: Orpha Yancey LABOR, MD  Patient coming from: Home  Chief Complaint:  Chief Complaint  Patient presents with   Shortness of Breath   HPI: Maureen Jackson is a 68 y.o. female with medical history significant of hypertension, T2DM, CAD s/p DES 2018, glaucoma, chronic combined HFrEF and HFpEF, stage IIIa CKD, morbid obesity (BMI 56.6), and OSA who presented to the emergency department from home via EMS due to 2-day onset of shortness of breath which worsens with exertion.  She complained of worsening shortness of breath on laying flat in bed.  She also complained of left eye pain and that she was diagnosed with blood clot behind left eye recently about 2 weeks ago and was scheduled for a follow-up with the retinologist in 3 days to discuss surgical intervention.  She complained of redness of left eye with some pain which is new from last visit to ophthalmology.  ED Course:  In the emergency department, BP was 159/88, other vital signs were within normal range.  Workup in the ED showed normocytic anemia, BMP was normal except for blood glucose of 207, BUN/creatinine 29/1.32, troponin 44 >> 39, proBNP 1,543. Chest x-ray showed moderate cardiomegaly with findings of interstitial edema Patient was treated with IV Lasix  20 mg x 1, IV morphine  4 mg x 1 was given.  Tetracaine drops was applied to left eye. Ophthalmologist on-call (Dr. Valdemar) was consulted regarding patient's complaint of left eye pain and recommended that patient can be admitted here at [facility] and also recommended erythromycin ophthalmic ointment and ofloxacin eyedrops.  Review of Systems: Review of systems as noted in the HPI. All other systems reviewed and are negative.   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 multivessel CAD by cath in 09/2017 and not felt to be a CABG candidate --> underwent two-vessel PCI with DES to the LAD and DES to the RCA   Diabetes mellitus    Diabetes mellitus, type II (HCC)    Dyspnea    Glaucoma    Hypertension    Left bundle branch block    Morbid obesity (HCC)    Obstructive sleep apnea    does not wear CPAP   Thyroid  disease    Past Surgical History:  Procedure Laterality Date   ABDOMINAL HYSTERECTOMY     CARDIAC CATHETERIZATION  09/12/2017   CORONARY STENT INTERVENTION  09/12/2017   STENT RESOLUTE ONYX 7.24K61 drug eluting stent was successfully placed   CORONARY STENT INTERVENTION N/A 09/12/2017   Procedure: CORONARY STENT INTERVENTION;  Surgeon: Anner Alm ORN, MD;  Location: [facility] INVASIVE CV LAB;  Service: Cardiovascular;  Laterality: N/A;   INCISION AND DRAINAGE PERIRECTAL ABSCESS Left 09/16/2019   Procedure: IRRIGATION AND DEBRIDEMENT LABIA ABSCESS;  Surgeon: Vernetta Berg, MD;  Location: [facility] OR;  Service: General;  Laterality: Left;   IR FLUORO GUIDE CV LINE RIGHT  09/16/2019   IR US  GUIDE VASC ACCESS RIGHT  09/16/2019   LEFT HEART CATH AND CORONARY ANGIOGRAPHY N/A 09/12/2017   Procedure: LEFT HEART CATH AND CORONARY ANGIOGRAPHY;  Surgeon: Anner Alm ORN, MD;  Location: [facility] INVASIVE CV LAB;  Service: Cardiovascular;  Laterality: N/A;   MASS EXCISION N/A 12/18/2018   Procedure: EXCISION TONGUE MASS;  Surgeon: Karis Clunes, MD;  Location: [facility] OR;  Service: ENT;  Laterality: N/A;   PERICARDIOCENTESIS N/A 12/22/2022   Procedure: PERICARDIOCENTESIS;  Surgeon: Wendel Lurena POUR, MD;  Location: [facility] INVASIVE CV LAB;  Service: Cardiovascular;  Laterality: N/A;   RIGHT/LEFT HEART CATH AND CORONARY ANGIOGRAPHY N/A 09/10/2017   Procedure: RIGHT/LEFT HEART CATH AND CORONARY ANGIOGRAPHY;  Surgeon: Burnard Debby LABOR, MD;  Location: [facility] INVASIVE CV LAB;  Service:  Cardiovascular;  Laterality: N/A;   SUBXYPHOID PERICARDIAL WINDOW N/A 12/23/2022   Procedure: SUBXYPHOID PERICARDIAL WINDOW;  Surgeon: Maryjane Mt, MD;  Location: [facility] OR;  Service: Thoracic;  Laterality: N/A;   TEE WITHOUT CARDIOVERSION N/A 12/23/2022   Procedure: TRANSESOPHAGEAL ECHOCARDIOGRAM (TEE);  Surgeon: Maryjane Mt, MD;  Location: [facility] OR;  Service: Thoracic;  Laterality: N/A;   THYROID  SURGERY      Social History:  reports that she quit smoking about 18 years ago. Her smoking use included cigarettes. She has never been exposed to tobacco smoke. She has never used smokeless tobacco. She reports that she does not drink alcohol and does not use drugs.   Allergies  Allergen Reactions   Ampicillin  Other (See Comments)    Allergic, per [facility]   Aspirin  Nausea Only   Hydrocodone  Other (See Comments)    Allergic, per [facility]   Unasyn  [Ampicillin -Sulbactam Sodium ] Rash and Other (See Comments)    Allergic, per [facility]    Family History  Problem Relation Age of Onset   Diabetes Mother    Hypertension Mother    Cancer Mother        pancreas   Hypertension Sister       Prior to Admission medications   Medication Sig Start Date End Date Taking? Authorizing Provider  acetaminophen  (TYLENOL ) 325 MG tablet Take 2 tablets (650 mg total) by mouth every 4 (four) hours as needed for mild pain, fever or headache. 05/25/20   Pearlean Manus, MD  apixaban  (ELIQUIS ) 5 MG TABS tablet Take 1 tablet (5 mg total) by mouth 2 (two) times daily. 05/25/24   Evonnie Lenis, MD  aspirin  EC 81 MG tablet Take 1 tablet (81 mg total) by mouth daily. Swallow whole. 05/26/24   Evonnie Lenis, MD  atorvastatin  (LIPITOR ) 80 MG tablet TAKE 1 TABLET(80 MG) BY MOUTH DAILY 04/23/23   Alvan Dorn FALCON, MD  Blood Pressure Monitoring (OMRON 3 SERIES BP MONITOR) DEVI Use as directed 08/27/23   Miriam Norris, NP  calcium  carbonate (TUMS - DOSED IN MG ELEMENTAL CALCIUM ) 500 MG chewable tablet Chew 4 tablets by mouth 2 (two) times  daily.    [provider]  cetirizine  (ZYRTEC ) 10 MG tablet Take 10 mg by mouth daily as needed for allergies.  09/12/18   [provider]  COMBIGAN  0.2-0.5 % ophthalmic solution Place 1 drop into both eyes 2 (two) times daily. 02/08/22   [provider]  empagliflozin  (JARDIANCE ) 10 MG TABS tablet Take 1 tablet (10 mg total) by mouth daily. 05/26/24   Evonnie Lenis, MD  furosemide  (LASIX ) 40 MG tablet Take 1.5 tablets (60 mg total) by mouth daily. 08/01/24   Milford, Harlene HERO, FNP  glucose blood (ONETOUCH VERIO) test strip Test glucose 4 times day. 04/19/22   Nida, Gebreselassie W, MD  insulin  aspart (NOVOLOG ) 100 UNIT/ML injection Inject 0-15 Units into the skin 3 (three) times daily with meals. 01/02/23   Bryn Bernardino NOVAK, MD  levothyroxine  (  SYNTHROID ) 200 MCG tablet Take 200 mcg by mouth daily before breakfast. Take 1 tablet with 50mcg for a total dose of 250mcg daily    [provider]  levothyroxine  (SYNTHROID ) 50 MCG tablet Take 50 mcg by mouth daily before breakfast. Take 1 tablet with 200mcg for a total dose of 250mcg daily    [provider]  metoprolol  succinate (TOPROL -XL) 25 MG 24 hr tablet TAKE 1 TABLET(25 MG) BY MOUTH DAILY 02/18/24   Miriam Norris, NP  ondansetron  (ZOFRAN -ODT) 4 MG disintegrating tablet Take 1 tablet (4 mg total) by mouth every 8 (eight) hours as needed for nausea or vomiting. 06/05/24   Kommor, Madison, MD  OZEMPIC, 0.25 OR 0.5 MG/DOSE, 2 MG/3ML SOPN Inject 0.5 mg into the skin once a week. Fridays 05/03/23   [provider]  sacubitril -valsartan  (ENTRESTO ) 97-103 MG Take 1 tablet by mouth 2 (two) times daily. 08/01/24   Milford, Harlene HERO, FNP  senna-docusate (SENOKOT-S) 8.6-50 MG tablet Take 2 tablets by mouth 2 (two) times daily. Patient taking differently: Take 2 tablets by mouth at bedtime as needed for mild constipation. 05/25/20   Pearlean Manus, MD  spironolactone  (ALDACTONE ) 25 MG tablet Take 0.5 tablets (12.5 mg  total) by mouth daily. 05/25/24   Evonnie Lenis, MD  VENTOLIN  HFA 108 (90 Base) MCG/ACT inhaler Inhale 1-2 puffs into the lungs every 6 (six) hours as needed for wheezing or shortness of breath. 05/25/20   Pearlean Manus, MD    Physical Exam: BP (!) 160/91 (BP Location: Left Wrist)   Pulse 61   Temp 97.9 F (36.6 C) (Oral)   Resp 18   Ht 5' 5 (1.651 m)   Wt (!) 149.6 kg   SpO2 100%   BMI 54.88 kg/m   General: 68 y.o. year-old female well developed well nourished in no acute distress.  Alert and oriented x3. HEENT: NCAT, left eye with redness of sclera, thick greenish discharge noted. Neck: Supple, trachea medial Cardiovascular: Regular rate and rhythm with no rubs or gallops.  No thyromegaly or JVD noted.  No lower extremity edema. 2/4 pulses in all 4 extremities. Respiratory: Bilateral Rales in lower lobes on auscultation with no wheezes.  Abdomen: Soft, nontender nondistended with normal bowel sounds x4 quadrants. Muskuloskeletal: No cyanosis, clubbing or edema noted bilaterally Neuro: CN II-XII intact, strength 5/5 x 4, sensation, reflexes intact Skin: No ulcerative lesions noted or rashes Psychiatry: Mood is appropriate for condition and setting          Labs on Admission:  Basic Metabolic Panel: Recent Labs  Lab 09/14/24 1630  NA 139  K 4.1  CL 107  CO2 25  GLUCOSE 207*  BUN 29*  CREATININE 1.32*  CALCIUM  7.9*   Liver Function Tests: No results for input(s): AST, ALT, ALKPHOS, BILITOT, PROT, ALBUMIN  in the last 168 hours. No results for input(s): LIPASE, AMYLASE in the last 168 hours. No results for input(s): AMMONIA in the last 168 hours. CBC: Recent Labs  Lab 09/14/24 1630  WBC 6.6  HGB 9.4*  HCT 33.3*  MCV 84.7  PLT 270   Cardiac Enzymes: No results for input(s): CKTOTAL, CKMB, CKMBINDEX, TROPONINI in the last 168 hours.  BNP (last 3 results) Recent Labs    05/17/24 0652 06/05/24 1634 08/01/24 1156  BNP 94.0 762.0*  256.1*    ProBNP (last 3 results) Recent Labs    09/14/24 1630  PROBNP 1,543.0*    CBG: No results for input(s): GLUCAP in the last 168 hours.  Radiological Exams on Admission: DG Chest 2 View Result Date: 09/14/2024 CLINICAL DATA:  SOB EXAM: CHEST - 2 VIEW COMPARISON:  06/05/2024 FINDINGS: Mild diffuse interstitial opacities throughout both lungs. No focal airspace consolidation, pleural effusion, or pneumothorax. Moderate cardiomegaly. No acute fracture or destructive lesions. Multilevel thoracic osteophytosis. Surgical clips in the lower neck. IMPRESSION: Moderate cardiomegaly with findings of interstitial edema. Electronically Signed   By: Rogelia Myers M.D.   On: 09/14/2024 16:58    EKG: I independently viewed the EKG done and my findings are as followed: Normal sinus rhythm at rate of 61 bpm with nonspecific interventricular conduction delay and QTc of 509 ms  Assessment/Plan Present on Admission:  Acquired hypothyroidism  Elevated troponin  Morbid obesity/BMI > 55  Coronary artery disease involving native coronary artery of native heart without angina pectoris  Mixed hyperlipidemia  Acute on chronic combined systolic and diastolic CHF (congestive heart failure) (HCC)  Principal Problem:   Acute on chronic combined systolic and diastolic CHF (congestive heart failure) (HCC) Active Problems:   Acquired hypothyroidism   Morbid obesity/BMI > 55   Elevated troponin   Coronary artery disease involving native coronary artery of native heart without angina pectoris   Mixed hyperlipidemia   Acute bacterial conjunctivitis of left eye   Prolonged QT interval   Chronic kidney disease, stage 3b (HCC)   Type 2 diabetes mellitus with hyperglycemia (HCC)   History of DVT (deep vein thrombosis)  Acute on chronic combined systolic and diastolic CHF proBNP 1,543 Chest x-ray showed moderate cardiomegaly with findings of interstitial edema Continue total input/output, daily  weights and fluid restriction IV Lasix  20 mg x 1 was given.  Continue IV Lasix  40 mg twice daily Continue Jardiance  Continue heart healthy/carb modified diet  Echocardiogram done on 05/17/2024 showed LVEF of 30 to 35%.  LV demonstrated global hypokinesis.  Moderate LVH.  G1 DD.  Echocardiogram in the morning  Cardiology will be consulted and we shall await further recommendations  Presumed acute bacterial conjunctivitis of left eye Continue erythromycin ophthalmic ointment and ofloxacin per ophthalmologist recommendation  Elevated troponin possibly secondary to type II demand ischemia Troponin 44 >> 39.  Patient denies chest pain.    Prolonged QT interval QTc 519 Avoid QT prolonging drugs Magnesium  level will be checked Repeat EKG in the morning  Morbid obesity (BMI 54.88) Diet and lifestyle modification Patient may need to follow-up with outpatient PCP for weight loss program  CAD with prior DES to LAD and RCA 2018/PAD Continue home medications   CKD stage IIIb Creatinine 1.32 (Baseline creatinine 1-1 0.2) Renally adjust medications, avoid nephrotoxic agents/dehydration/hypotension   Acquired hypothyroidism Remote past history of thyroidectomy for benign goiter Continue levothyroxine    Mixed hyperlipidemia Continue Lipitor   Type 2 diabetes mellitus with hyperglycemia Most recent A1c on 05/17/2024 was 7.4 plan Continue Jardiance  Continue Semglee  15 units and adjust dose accordingly Continue ISS and hypoglycemia protocol  Essential hypertension Continue Entresto , amlodipine  Monitor carefully on IV Lasix  for diuresis  History of DVT Continue Eliquis     DVT prophylaxis: Eliquis   Family Communication: None at bedside   Advance Care Planning:   Code Status: Full Code   Consults: Cardiology   Severity of Illness: The appropriate patient status for this patient is OBSERVATION. Observation status is judged to be reasonable and necessary in order to provide the  required intensity of service to ensure the patient's safety. The patient's presenting symptoms, physical exam findings, and initial radiographic and laboratory data in the context of their  medical condition is felt to place them at decreased risk for further clinical deterioration. Furthermore, it is anticipated that the patient will be medically stable for discharge from the hospital within 2 midnights of admission.   Author: Lecil Tapp, DO 09/15/2024 2:28 AM  For on call review [email or web address].

## 2024-09-15 NOTE — Consult Note (Addendum)
 Cardiology Consultation   Patient ID: EMERSYN WYSS MRN: [id number]; DOB: Jan 09, 1956  Admit date: 09/14/2024 Date of Consult: 09/15/2024  PCP:  Orpha Yancey LABOR, MD   [facility] Providers Cardiologist:  Alvan Carrier, MD  Cardiology APP:  Miriam Norris, NP     Patient Profile: Maureen Jackson is a 68 y.o. female with a hx of pericardial effusion s/p window in 12/2022, HFrEF 2/2 ischemic cardiomyopathy, CAD (s/p 2018 DES to LAD, RCA x2, and medically managed LCX as opposed to CABG), PAD, HTN, T2DM, OSA not on CPAP, CKD and morbid obesity  who is being seen 09/15/2024 for the evaluation of acute CHF at the request of Dr. Leotis.  History of Present Illness: Maureen Jackson history of HFrEF dates back to at least 2018.  Echo at that time with EF of 30 to 35%, LHC showed severe multivessel CAD.  Underwent PCI as opposed to CABG with DES to the LAD, X2 to the RCA and left circumflex disease management likely.   2021, LVEF dropped to 30% with recovered to 40 to 50% in April 2023.   Jan 2024:  Underwent pericardiocentesis for a large effusion  Went on to require pericardial window.  Recent hospitalization 05/2024 with acute on chronic systolic heart failure.  Diuresed with IV Lasix  and transition to p.o. Lasix  60 mg daily.  Echo with EF 30 to 35%.  She was also treated for DVT RLE tibial vein.  Last seen in advanced heart failure clinic by Josefa Gainer, NP on 08/01/2024.  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  Overall noted feeling fine.   The pt says she decreased her Lasix  to every other day due to increased urination.  Lasix  was changed to 60 mg daily.  Increased Entresto  to 97/103 mg twice daily.  Continue Toprol  XL 25 mg daily, spironolactone  12.5 mg daily, Jardiance  10 mg daily, Lipitor  80 mg daily.   Presented to [facility] ED 10/5 for SOB with exertion.  EKG:  NSR, HR 67, Chronic LBBB, no ischemic changes  Pro BNP 1,543, TN 44> 39,  K4.1 now 4.3, Mg 2.5, CR 1.32 now 1.19, stable chronic anemia Hgb 9.4 now 10.  Chest x-ray showed moderate cardiomegaly with findings of interstitial edema.  Treated with IV lasix  20 mg x1 then 40 mg x2, IV morphine .  Ophthalmologist on-call (Dr. Valdemar) was consulted regarding patient's complaint of left eye pain and recommended that patient can be admitted here at [facility] and also recommended erythromycin ophthalmic ointment and ofloxacin eyedrops.   On interview, patient reported SOB with minimal exertion from room to room since Saturday after not taking Lasix  that same day. Prior to Saturday, patient noted SOB with longer distances.  Patient denies noticing any LE edema, however is told by MD in hospital that they were swelling.  Denies any orthopnea, PND, chest pain, palpitations, syncope.  Endorses a high sodium diet.  Patient was able to sit up on the side of the bed this a.m. without any shortness of breath but has not walked around in the room yet.  Reports good urine output on IV Lasix . Notes that she is usually compliant with HF medications daily.  However, she has been out of Lipitor  for the past couple of weeks due to pharmacy denying.  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 multivessel CAD by cath in 09/2017 and not felt to be a CABG candidate --> underwent two-vessel PCI with DES to the LAD and DES to the RCA   Diabetes mellitus    Diabetes mellitus, type II (HCC)    Dyspnea    Glaucoma    Hypertension    Left bundle branch block    Morbid obesity (HCC)    Obstructive sleep apnea    does not wear CPAP   Thyroid  disease     Past Surgical History:  Procedure Laterality Date   ABDOMINAL HYSTERECTOMY     CARDIAC CATHETERIZATION  09/12/2017   CORONARY STENT INTERVENTION  09/12/2017   STENT RESOLUTE  ONYX 7.24K61 drug eluting stent was successfully placed   CORONARY STENT INTERVENTION N/A 09/12/2017   Procedure: CORONARY STENT INTERVENTION;  Surgeon: Anner Alm ORN, MD;  Location: [facility] INVASIVE CV LAB;  Service: Cardiovascular;  Laterality: N/A;   INCISION AND DRAINAGE PERIRECTAL ABSCESS Left 09/16/2019   Procedure: IRRIGATION AND DEBRIDEMENT LABIA ABSCESS;  Surgeon: Vernetta Berg, MD;  Location: [facility] OR;  Service: General;  Laterality: Left;   IR FLUORO GUIDE CV LINE RIGHT  09/16/2019   IR US  GUIDE VASC ACCESS RIGHT  09/16/2019   LEFT HEART CATH AND CORONARY ANGIOGRAPHY N/A 09/12/2017   Procedure: LEFT HEART CATH AND CORONARY ANGIOGRAPHY;  Surgeon: Anner Alm ORN, MD;  Location[facility] INVASIVE CV LAB;  Service: Cardiovascular;  Laterality: N/A;   MASS EXCISION N/A 12/18/2018   Procedure: EXCISION TONGUE MASS;  Surgeon: Karis Clunes, MD;  Location: [facility] OR;  Service: ENT;  Laterality: N/A;   PERICARDIOCENTESIS N/A 12/22/2022   Procedure: PERICARDIOCENTESIS;  Surgeon: Wendel Lurena POUR, MD;  Location: [facility] INVASIVE CV LAB;  Service: Cardiovascular;  Laterality: N/A;   RIGHT/LEFT HEART CATH AND CORONARY ANGIOGRAPHY N/A 09/10/2017   Procedure: RIGHT/LEFT HEART CATH AND CORONARY ANGIOGRAPHY;  Surgeon: Burnard Debby LABOR, MD;  Location: [facility] INVASIVE CV LAB;  Service: Cardiovascular;  Laterality: N/A;   SUBXYPHOID PERICARDIAL WINDOW N/A 12/23/2022   Procedure: SUBXYPHOID PERICARDIAL WINDOW;  Surgeon: Maryjane Mt, MD;  Location: [facility] OR;  Service: Thoracic;  Laterality: N/A;   TEE WITHOUT CARDIOVERSION N/A 12/23/2022   Procedure: TRANSESOPHAGEAL ECHOCARDIOGRAM (TEE);  Surgeon: Maryjane Mt, MD;  Location: [facility] OR;  Service: Thoracic;  Laterality: N/A;   THYROID  SURGERY       Home Medications:  Prior to Admission medications   Medication Sig Start Date End Date Taking? Authorizing Provider  acetaminophen  (TYLENOL ) 325 MG tablet Take 2 tablets (650 mg total) by mouth every 4 (four) hours as needed for mild  pain, fever or headache. 05/25/20   Pearlean Manus, MD  apixaban  (ELIQUIS ) 5 MG TABS tablet Take 1 tablet (5 mg total) by mouth 2 (two) times daily. 05/25/24   Evonnie Alm, MD  aspirin  EC 81 MG tablet Take 1 tablet (81 mg total) by mouth daily. Swallow whole. 05/26/24   Evonnie Alm, MD  atorvastatin  (LIPITOR ) 80 MG tablet TAKE 1 TABLET(80 MG) BY MOUTH DAILY 09/15/24   Alvan Dorn FALCON, MD  Blood Pressure Monitoring (OMRON 3 SERIES BP MONITOR) DEVI Use as directed 08/27/23   Miriam Norris, NP  calcium  carbonate (TUMS - DOSED IN MG ELEMENTAL CALCIUM ) 500 MG chewable tablet Chew 4 tablets by mouth 2 (two) times daily.    [provider]  cetirizine  (ZYRTEC ) 10 MG tablet Take 10 mg by mouth daily as needed  for allergies.  09/12/18   [provider]  COMBIGAN  0.2-0.5 % ophthalmic solution Place 1 drop into both eyes 2 (two) times daily. 02/08/22   [provider]  empagliflozin  (JARDIANCE ) 10 MG TABS tablet Take 1 tablet (10 mg total) by mouth daily. 05/26/24   Evonnie Lenis, MD  furosemide  (LASIX ) 40 MG tablet Take 1.5 tablets (60 mg total) by mouth daily. 08/01/24   Milford, Harlene HERO, FNP  glucose blood (ONETOUCH VERIO) test strip Test glucose 4 times day. 04/19/22   Nida, Gebreselassie W, MD  insulin  aspart (NOVOLOG ) 100 UNIT/ML injection Inject 0-15 Units into the skin 3 (three) times daily with meals. 01/02/23   Bryn Bernardino NOVAK, MD  levothyroxine  (SYNTHROID ) 200 MCG tablet Take 200 mcg by mouth daily before breakfast. Take 1 tablet with 50mcg for a total dose of 250mcg daily    [provider]  levothyroxine  (SYNTHROID ) 50 MCG tablet Take 50 mcg by mouth daily before breakfast. Take 1 tablet with 200mcg for a total dose of 250mcg daily    [provider]  metoprolol  succinate (TOPROL -XL) 25 MG 24 hr tablet TAKE 1 TABLET(25 MG) BY MOUTH DAILY 02/18/24   Miriam Norris, NP  ondansetron  (ZOFRAN -ODT) 4 MG disintegrating tablet Take 1 tablet (4 mg total) by mouth every 8  (eight) hours as needed for nausea or vomiting. 06/05/24   Kommor, Madison, MD  OZEMPIC, 0.25 OR 0.5 MG/DOSE, 2 MG/3ML SOPN Inject 0.5 mg into the skin once a week. Fridays 05/03/23   [provider]  sacubitril -valsartan  (ENTRESTO ) 97-103 MG Take 1 tablet by mouth 2 (two) times daily. 08/01/24   Milford, Harlene HERO, FNP  senna-docusate (SENOKOT-S) 8.6-50 MG tablet Take 2 tablets by mouth 2 (two) times daily. Patient taking differently: Take 2 tablets by mouth at bedtime as needed for mild constipation. 05/25/20   Pearlean Manus, MD  spironolactone  (ALDACTONE ) 25 MG tablet Take 0.5 tablets (12.5 mg total) by mouth daily. 05/25/24   Evonnie Lenis, MD  VENTOLIN  HFA 108 (90 Base) MCG/ACT inhaler Inhale 1-2 puffs into the lungs every 6 (six) hours as needed for wheezing or shortness of breath. 05/25/20   Pearlean Manus, MD    Scheduled Meds:  apixaban   5 mg Oral BID   atorvastatin   80 mg Oral Daily   empagliflozin   10 mg Oral Daily   erythromycin   Both Eyes Once   furosemide   40 mg Intravenous Q12H   insulin  aspart  0-15 Units Subcutaneous TID WC   insulin  aspart  0-5 Units Subcutaneous QHS   insulin  glargine  15 Units Subcutaneous QHS   levothyroxine   200 mcg Oral QAC breakfast   levothyroxine   50 mcg Oral QAC breakfast   ofloxacin  1 drop Both Eyes QID   sacubitril -valsartan   1 tablet Oral BID   Continuous Infusions:  PRN Meds: acetaminophen  **OR** acetaminophen , artificial tears, prochlorperazine   Allergies:    Allergies  Allergen Reactions   Ampicillin  Other (See Comments)    Allergic, per [facility]   Aspirin  Nausea Only   Hydrocodone  Other (See Comments)    Allergic, per [facility]   Unasyn  [Ampicillin -Sulbactam Sodium ] Rash and Other (See Comments)    Allergic, per [facility]    Social History:   Social History   Socioeconomic History   Marital status: Widowed    Spouse name: Not on file   Number of children: 1   Years of education: Not on file   Highest education level: 11th  grade  Occupational History  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, he passed  Tobacco Use   Smoking status: Former    Current packs/day: 0.00    Types: Cigarettes    Quit date: 12/14/2005    Years since quitting: 18.7    Passive exposure: Never   Smokeless tobacco: Never   Tobacco comments:    QUIT IN 2005  Vaping Use   Vaping status: Never Used  Substance and Sexual Activity   Alcohol use: No   Drug use: No   Sexual activity: Never  Other Topics Concern   Not on file  Social History Narrative   Not on file     Family History:   Family History  Problem Relation Age of Onset   Diabetes Mother    Hypertension Mother    Cancer Mother        pancreas   Hypertension Sister      ROS:  Please see the history of present illness.  All other ROS reviewed and negative.     Physical Exam/Data: Vitals:   09/14/24 2345 09/15/24 0112 09/15/24 0205 09/15/24 0500  BP: (!) 152/76 (!) 160/91  (!) 154/74  Pulse: 63 61  61  Resp: 18   18  Temp:  97.9 F (36.6 C)  98.5 F (36.9 C)  TempSrc:  Oral  Oral  SpO2: 100% 100% 100% 100%  Weight:  (!) 149.6 kg    Height:  5' 5 (1.651 m)      Intake/Output Summary (Last 24 hours) at 09/15/2024 0911 Last data filed at 09/14/2024 2339 Gross per 24 hour  Intake --  Output 800 ml  Net -800 ml      09/15/2024    1:12 AM 09/14/2024    4:03 PM 08/01/2024   11:13 AM  Last 3 Weights  Weight (lbs) 329 lb 12.9 oz 337 lb 337 lb 6.4 oz  Weight (kg) 149.6 kg 152.862 kg 153.044 kg     Body mass index is 54.88 kg/m.  General:  Well nourished, well developed, in no acute distress HEENT: normal Neck: difficult to assess due to body habitus.  Vascular: No carotid bruits; Distal pulses 2+ bilaterally Cardiac:  normal S1, S2; RRR; no murmur  Lungs:  diminished lung sounds in bases.  Abd: soft, nontender, no hepatomegaly  Ext: trace LE edema Musculoskeletal:  No deformities, BUE and BLE strength normal and equal Skin: warm and dry   Neuro:  CNs 2-12 intact, no focal abnormalities noted Psych:  Normal affect   EKG:  The EKG was personally reviewed and demonstrates:   NSR, HR 67, Chronic LBBB, no ischemic changes  Telemetry:  Telemetry was personally reviewed and demonstrates:  NSR, HR 60-70's  Relevant CV Studies: ECHO IMPRESSIONS 05/17/2024  1. Left ventricular ejection fraction, by estimation, is 30 to 35%. The  left ventricle has moderately decreased function. The left ventricle  demonstrates global hypokinesis. The left ventricular internal cavity size  was moderately dilated. There is  moderate left ventricular hypertrophy. Left ventricular diastolic  parameters are consistent with Grade I diastolic dysfunction (impaired  relaxation).   2. Right ventricular systolic function is normal. The right ventricular  size is normal.   3. Left atrial size was mildly dilated.   4. A small pericardial effusion is present. The pericardial effusion is  posterior to the left ventricle and anterior to the right ventricle.   5. The mitral valve is abnormal. No evidence of mitral valve  regurgitation. No evidence of mitral stenosis. Moderate mitral annular  calcification.   6. The aortic valve is tricuspid. There is mild calcification of the  aortic valve. There is mild thickening of the aortic valve. Aortic valve  regurgitation is not visualized. Aortic valve sclerosis is present, with  no evidence of aortic valve stenosis.   7. The inferior vena cava is normal in size with greater than 50%  respiratory variability, suggesting right atrial pressure of 3 mmHg.    US  Venous LE 05/17/2024 IMPRESSION: 1. Positive for deep vein thrombosis involving the right posterior tibial vein. 2. No evidence for deep vein thrombosis within the left lower extremity  Laboratory Data: High Sensitivity Troponin:  No results for input(s): TROPONINIHS in the last 720 hours.   Chemistry Recent Labs  Lab 09/14/24 1630 09/15/24 0741  NA 139  143  K 4.1 4.3  CL 107 108  CO2 25 25  GLUCOSE 207* 118*  BUN 29* 29*  CREATININE 1.32* 1.19*  CALCIUM  7.9* 8.0*  MG  --  2.5*  GFRNONAA 44* 50*  ANIONGAP 8 10    Recent Labs  Lab 09/15/24 0741  PROT 7.2  ALBUMIN  3.4*  AST 28  ALT 25  ALKPHOS 295*  BILITOT 0.7   Lipids No results for input(s): CHOL, TRIG, HDL, LABVLDL, LDLCALC, CHOLHDL in the last 168 hours.  Hematology Recent Labs  Lab 09/14/24 1630 09/15/24 0528  WBC 6.6 7.4  RBC 3.93 4.16  HGB 9.4* 10.1*  HCT 33.3* 35.1*  MCV 84.7 84.4  MCH 23.9* 24.3*  MCHC 28.2* 28.8*  RDW 15.2 15.5  PLT 270 202   Thyroid  No results for input(s): TSH, FREET4 in the last 168 hours.  BNP Recent Labs  Lab 09/14/24 1630  PROBNP 1,543.0*    DDimer No results for input(s): DDIMER in the last 168 hours.  Radiology/Studies:  DG Chest 2 View Result Date: 09/14/2024 CLINICAL DATA:  SOB EXAM: CHEST - 2 VIEW COMPARISON:  06/05/2024 FINDINGS: Mild diffuse interstitial opacities throughout both lungs. No focal airspace consolidation, pleural effusion, or pneumothorax. Moderate cardiomegaly. No acute fracture or destructive lesions. Multilevel thoracic osteophytosis. Surgical clips in the lower neck. IMPRESSION: Moderate cardiomegaly with findings of interstitial edema. Electronically Signed   By: Rogelia Myers M.D.   On: 09/14/2024 16:58     Assessment and Plan: Acute on Chronic HFrEF Echo 05/17/2024: EF 30 to 35%, moderately decreased LV function, global hypokinesis, moderately dilated LV cavity, G1 DD, mildly dilated LA\mild calcification and thickening of AV without AS.  Repeat echo pending.  Presented with SOB with minimal exertion since Saturday after not taking lasix  that same day.  Pro BNP 1,543 Chest x-ray showed moderate cardiomegaly with findings of interstitial edema.  Treated with IV lasix  20 mg x1 then 40 mg x2.  I/O -800 ml, 337 > 329 lbs (06/2024: 343 lb; 07/2024: 337 llbs), Cr 1.32 > 1.19.  Suspect  inaccurate I/O. Suspect Red vest would be inaccurate due to body habitus.  Still appear volume overloaded on exam. Continue IV lasix  40 mg BID.  Order home med Toprol  XL 25 mg  Continue Jardiance  10 mg daily, Entresto  97/103 mg twice daily Continue to hold spironolactone . Can restart once switched to PO lasix .  Suspect secondary to high sodium diet and medication noncompliance.  Encouraged low sodium diet  2000 mg/daily, fluid restriction <2L, and daily weights.  Per chart review, not a candidate for advanced therapies due to obesity   CAD  HLD Troponin 44 >> 39   Most likely demand in setting of  CHF  Less concern for ischemic cause with flat troponin, no chest pain, and EKG w/o ischemic changes.  Continue Lipitor  80 mg Order home med Toprol  XL 25 mg  Not on Plavix  given need for [facility].  HTN  BP elevated 154/74.  Managed by GDMT above.  Order home med Toprol  XL 25 mg   CKD 3B  Initially Cr 1.32 now 1.19 (Baseline creatinine 1-1.2)  Continue to monitor  History of DVT  Continue Eliquis  5 mg BID   DM Most recent A1c 05/18/2023: 7.4 Managed by primary  Pericardial effusion  s/p failed pericardiocentesis;  Underwent pericardial window in 12/2022 ECHO 05/17/2024: small pericardial effusion posterior to the LV and anterior to the RV. ECHO pending.    Risk Assessment/Risk Scores: [place]  Heart Association (NYHA) Functional Class NYHA Class III   For questions or updates, please contact [facility] Please consult [email or web address] for contact info under   Signed, Lorette CINDERELLA Kapur, PA-C  09/15/2024 9:11 AM   Pt seen and examined    I have amended note above to reflect my findings   Pt is a 68 yo with hx of HFrEF, CAD, HTN, CKD, pericardial effusion (s/p window placement) and obesity  Admitted with increased SOB and LE edema   Pt admits to skipping lasix  and to eating saltier foods   On exam, pt is morbidly obese Neck is full Lungs   Decreased BS at bases Cardiac RRR  No  S3  No murmurs ABd   Distended   Nontender Ext   Tr LE edema  HFrEF As noted above, continue IV lasix  Continue Jardiance  and Entresto .   Restart Toprol  XL Stressed importance of keeping on meds and avoid high Na foods   HTN    BI is not optimal Add Toprol  XL back Continue to diurese.  Will continue to follow   Vina Gull MD

## 2024-09-15 NOTE — Care Plan (Signed)
 This 68 yrs old female with medical history significant of hypertension, T2DM, CAD s/p DES 2018, glaucoma, chronic combined HFrEF and HFpEF, stage IIIa CKD, morbid obesity (BMI 56.6), and OSA who presented to the emergency department with c/o: shortness of breath which worsens with exertion for last 2 days.  She complained of worsening shortness of breath even on laying flat in bed.  Workup in the ED shows proBNP 1543, chest x-ray shows moderate cardiomegaly with findings of interstitial edema.  Patient was admitted for further evaluation and started on IV diuretics.  Ophthalmologist on-call Dr. Valdemar was consulted regarding patient's complaints of left eye pain and recommended patient can be admitted and recommended erythromycin and ofloxacin eyedrops.  Patient was seen and examined at bedside.  She is still appears volume overloaded but reports feeling improved.  Assessment and plan: Acute on chronic combined systolic and diastolic CHF: Patient presented with progressive shortness of breath, orthopnea, peripheral edema.  proBNP 1,543 Chest x-ray showed moderate cardiomegaly with findings of interstitial edema. Continue total input /output, daily weights and fluid restriction. Continue IV Lasix  40 mg twice daily Continue Jardiance . Continue heart healthy/carb modified diet       Echocardiogram done on 05/17/2024 showed LVEF of 30 to 35%.  LV demonstrated global hypokinesis.  Moderate LVH.  G1 DD.  Echocardiogram in the morning  Cardiology consulted.  She is still appears volume overloaded on exam continue IV Lasix  40 mg twice daily.   Presumed acute bacterial conjunctivitis of left eye: Continue erythromycin ophthalmic ointment and ofloxacin per ophthalmologist recommendation   Elevated troponin possibly secondary to type II demand ischemia: Troponin 44 >> 39.  Patient denies chest pain.     Prolonged QT interval: QTc 519 Avoid QT prolonging drugs Magnesium  normal. Repeat EKG in the morning    Morbid obesity (BMI 54.88) Diet and lifestyle modification Patient may need to follow-up with outpatient PCP for weight loss program.   CAD with prior DES to LAD and RCA 2018/PAD: Continue home medications.   CKD stage IIIb: Creatinine 1.32 (Baseline creatinine 1-1 0.2) Renally adjust medications, avoid nephrotoxic agents/dehydration/hypotension.   Acquired hypothyroidism: Remote past history of thyroidectomy for benign goiter Continue levothyroxine  .   Mixed hyperlipidemia: Continue Lipitor    Type 2 diabetes mellitus with hyperglycemia: Most recent A1c on 05/17/2024 was 7.4 plan Continue Jardiance  Continue Semglee  15 units and adjust dose accordingly Continue ISS and hypoglycemia protocol   Essential hypertension: Continue Entresto , amlodipine  Monitor carefully on IV Lasix  for diuresis   History of DVT: Continue Eliquis 

## 2024-09-15 NOTE — TOC Initial Note (Signed)
 Transition of Care [facility]) - Initial/Assessment Note    Patient Details  Name: Maureen Jackson MRN: [id number] Date of Birth: 1956/08/27  Transition of Care [facility]) CM/SW Contact:    Maureen Jackson, LCSWA Phone Number: 09/15/2024, 11:40 AM  Clinical Narrative:                  Patient is at risk for readmission. Patient was admitted for Acute on chronic combined systolic and diastolic CHF (congestive heart failure). Patient reports that she lives alone and has an aide for 5 hours x 4 days a week. She reports that the aide assist her with all ADL's and house chores when she is unable to do it for herself. Patient reports that she has [facility] services or sometimes she goes with the aide to the store and to her appointments. Patient reports that she has a walker and a cane at home. CSW will continue to follow.    Expected Discharge Plan: Home/Self Care Barriers to Discharge: Continued Medical Work up   Patient Goals and CMS Choice Patient states their goals for this hospitalization and ongoing recovery are:: return back home CMS Medicare.gov Compare Post Acute Care list provided to:: Patient        Expected Discharge Plan and Services In-house Referral: Clinical Social Work   Post Acute Care Choice: Durable Medical Equipment Living arrangements for the past 2 months: Single Family Home                                      Prior Living Arrangements/Services Living arrangements for the past 2 months: Single Family Home Lives with:: Self Patient language and need for interpreter reviewed:: Yes Do you feel safe going back to the place where you live?: Yes      Need for Family Participation in Patient Care: Yes (Comment) Care giver support system in place?: Yes (comment) Current home services: DME, Other (comment) (Aide from [facility]) Criminal Activity/Legal Involvement Pertinent to Current Situation/Hospitalization: No - Comment as needed  Activities of Daily Living    ADL Screening (condition at time of admission) Independently performs ADLs?: No (Patient has an aid that comes to her house to help her with bathing, etc.) Does the patient have a NEW difficulty with bathing/dressing/toileting/self-feeding that is expected to last >3 days?: No Does the patient have a NEW difficulty with getting in/out of bed, walking, or climbing stairs that is expected to last >3 days?: No Does the patient have a NEW difficulty with communication that is expected to last >3 days?: No Is the patient deaf or have difficulty hearing?: No Does the patient have difficulty seeing, even when wearing glasses/contacts?: Yes Does the patient have difficulty concentrating, remembering, or making decisions?: No  Permission Sought/Granted      Share Information with NAME: Maureen Jackson     Permission granted to share info w Relationship: Patient     Emotional Assessment Appearance:: Appears stated age Attitude/Demeanor/Rapport: Self-Confident Affect (typically observed): Accepting Orientation: : Oriented to Self, Oriented to Place, Oriented to  Time, Oriented to Situation Alcohol / Substance Use: Not Applicable Psych Involvement: No (comment)  Admission diagnosis:  Acute exacerbation of CHF (congestive heart failure) (HCC) [I50.9] Acute bacterial conjunctivitis of left eye [H10.32] Acute on chronic congestive heart failure, unspecified heart failure type [facility]) [I50.9] Patient Active Problem List   Diagnosis Date Noted   Acute bacterial conjunctivitis of left eye 09/15/2024  Prolonged QT interval 09/15/2024   Chronic kidney disease, stage 3b (HCC) 09/15/2024   Type 2 diabetes mellitus with hyperglycemia (HCC) 09/15/2024   History of DVT (deep vein thrombosis) 09/15/2024   Anemia, iron  deficiency 08/18/2024   Acute DVT (deep venous thrombosis) (HCC) 05/20/2024   Acute on chronic systolic CHF (congestive heart failure) (HCC) 05/16/2024   Cardiac tamponade 12/21/2022   Acute on  chronic combined systolic and diastolic CHF (congestive heart failure) (HCC) 12/20/2022   Acute on chronic combined systolic (congestive) and diastolic (congestive) heart failure (HCC) 05/18/2020   Acute respiratory disease due to COVID-19 virus 12/26/2019   Pressure injury of skin 09/21/2019   Acute renal failure superimposed on stage 3b chronic kidney disease (HCC)    Drug rash    Abscess    Fistula    Fournier's gangrene in female [facility])    Chest pain 07/16/2018   Mixed hyperlipidemia 04/10/2018   TIA (transient ischemic attack) 03/14/2018   Vertigo 03/14/2018   Chronic combined systolic and diastolic CHF (congestive heart failure) (EF 30 to 35 %) 03/14/2018   Ischemic cardiomyopathy 11/06/2017   Coronary artery disease involving native coronary artery of native heart without angina pectoris    Pericardial effusion 09/07/2017   Elevated troponin 09/07/2017   Acute combined systolic (congestive) and diastolic (congestive) heart failure (HCC) 09/06/2017   Cardiomegaly 09/05/2017   Morbid obesity/BMI > 55 03/04/2013   Labyrinthitis 03/04/2013   DM type 2 causing vascular disease (HCC) 03/04/2013   GASTRITIS 12/13/2006   HIATAL HERNIA, HX OF 12/13/2006   S/P thyroidectomy 12/13/2006   Acquired hypothyroidism 10/04/2006   TOBACCO ABUSE 10/04/2006   CARPAL TUNNEL SYNDROME 10/04/2006   Essential hypertension, benign 10/04/2006   GERD 10/04/2006   POSTMENOPAUSAL STATUS 10/04/2006   SKIN TAG 10/04/2006   KNEE PAIN, LEFT 10/04/2006   Sleep apnea 10/04/2006   LEG EDEMA 10/04/2006   PCP:  Maureen Jackson LABOR, MD Pharmacy:   [facility] [phone] - [place], [place] - [address] [place] [address] Phone: [phone] Fax: [phone]     Social Drivers of Health (SDOH) Social History: SDOH Screenings   Food Insecurity: No Food Insecurity (09/15/2024)  Housing: Low Risk  (09/15/2024)  Transportation Needs: Unmet  Transportation Needs (09/15/2024)  Utilities: Not At Risk (09/15/2024)  Alcohol Screen: Low Risk  (12/22/2022)  Depression (PHQ2-9): Low Risk  (01/01/2020)  Financial Resource Strain: Low Risk  (05/19/2024)  Social Connections: Unknown (09/15/2024)  Tobacco Use: Medium Risk (09/15/2024)   SDOH Interventions:     Readmission Risk Interventions    09/15/2024   11:39 AM 05/22/2024    2:03 PM 05/22/2024   12:06 PM  Readmission Risk Prevention Plan  Transportation Screening Complete Complete Complete  Home Care Screening  Complete Complete  Medication Review (RN CM)  Complete Complete  HRI or Home Care Consult Complete    Social Work Consult for Recovery Care Planning/Counseling Complete    Palliative Care Screening Not Applicable    Medication Review Oceanographer) Complete

## 2024-09-15 NOTE — Progress Notes (Signed)
*  PRELIMINARY RESULTS* Echocardiogram 2D Echocardiogram has been performed with Definity .  Maureen Jackson 09/15/2024, 3:00 PM

## 2024-09-16 DIAGNOSIS — I5043 Acute on chronic combined systolic (congestive) and diastolic (congestive) heart failure: Secondary | ICD-10-CM | POA: Diagnosis not present

## 2024-09-16 LAB — GLUCOSE, CAPILLARY
Glucose-Capillary: 162 mg/dL — ABNORMAL HIGH (ref 70–99)
Glucose-Capillary: 123 mg/dL — ABNORMAL HIGH (ref 70–99)
Glucose-Capillary: 174 mg/dL — ABNORMAL HIGH (ref 70–99)
Glucose-Capillary: 168 mg/dL — ABNORMAL HIGH (ref 70–99)

## 2024-09-16 LAB — URINALYSIS, COMPLETE (UACMP) WITH MICROSCOPIC
Bilirubin Urine: NEGATIVE
Glucose, UA: 150 mg/dL — AB
Hgb urine dipstick: NEGATIVE
Ketones, ur: NEGATIVE mg/dL
Nitrite: NEGATIVE
Protein, ur: NEGATIVE mg/dL
Specific Gravity, Urine: 1.01 (ref 1.005–1.030)
pH: 5 (ref 5.0–8.0)

## 2024-09-16 LAB — BASIC METABOLIC PANEL WITH GFR
Anion gap: 11 (ref 5–15)
BUN: 37 mg/dL — ABNORMAL HIGH (ref 8–23)
CO2: 25 mmol/L (ref 22–32)
Calcium: 8.1 mg/dL — ABNORMAL LOW (ref 8.9–10.3)
Chloride: 102 mmol/L (ref 98–111)
Creatinine, Ser: 1.46 mg/dL — ABNORMAL HIGH (ref 0.44–1.00)
GFR, Estimated: 39 mL/min — ABNORMAL LOW (ref >60)
Glucose, Bld: 172 mg/dL — ABNORMAL HIGH (ref 70–99)
Potassium: 4 mmol/L (ref 3.5–5.1)
Sodium: 138 mmol/L (ref 135–145)

## 2024-09-16 LAB — CBC
HCT: 31.9 % — ABNORMAL LOW (ref 36.0–46.0)
Hemoglobin: 9.5 g/dL — ABNORMAL LOW (ref 12.0–15.0)
MCH: 24.7 pg — ABNORMAL LOW (ref 26.0–34.0)
MCHC: 29.8 g/dL — ABNORMAL LOW (ref 30.0–36.0)
MCV: 83.1 fL (ref 80.0–100.0)
Platelets: 290 K/uL (ref 150–400)
RBC: 3.84 MIL/uL — ABNORMAL LOW (ref 3.87–5.11)
RDW: 15.6 % — ABNORMAL HIGH (ref 11.5–15.5)
WBC: 8.6 K/uL (ref 4.0–10.5)
nRBC: 0 % (ref 0.0–0.2)

## 2024-09-16 LAB — PHOSPHORUS: Phosphorus: 4 mg/dL (ref 2.5–4.6)

## 2024-09-16 LAB — MAGNESIUM: Magnesium: 2.5 mg/dL — ABNORMAL HIGH (ref 1.7–2.4)

## 2024-09-16 MED ORDER — CALCIUM CARBONATE ANTACID 500 MG PO CHEW
4.0000 | CHEWABLE_TABLET | Freq: Two times a day (BID) | ORAL | Status: DC
  Administered 2024-09-16 – 2024-09-20 (×10): 800 mg via ORAL
  Filled 2024-09-16 (×10): qty 4

## 2024-09-16 NOTE — Progress Notes (Signed)
 Rounding Note   Patient Name: GRAHAM DOUKAS Date of Encounter: 09/16/2024  [facility] Cardiologist: Alvan Carrier, MD   Subjective Pt says she feels weak , just weak   Scheduled Meds:  apixaban   5 mg Oral BID   atorvastatin   80 mg Oral Daily   calcium  carbonate  4 tablet Oral BID WC   empagliflozin   10 mg Oral Daily   erythromycin   Both Eyes Once   furosemide   40 mg Intravenous Q12H   insulin  aspart  0-15 Units Subcutaneous TID WC   insulin  aspart  0-5 Units Subcutaneous QHS   insulin  glargine  15 Units Subcutaneous QHS   levothyroxine   200 mcg Oral QAC breakfast   levothyroxine   50 mcg Oral QAC breakfast   metoprolol  succinate  25 mg Oral Daily   ofloxacin  1 drop Both Eyes QID   sacubitril -valsartan   1 tablet Oral BID   Continuous Infusions:  PRN Meds: acetaminophen  **OR** acetaminophen , artificial tears, hydrOXYzine, prochlorperazine    Vital Signs  Vitals:   09/15/24 0205 09/15/24 0500 09/15/24 2056 09/16/24 0448  BP:  (!) 154/74 107/62 (!) 147/81  Pulse:  61 67 78  Resp:  18 20 18   Temp:  98.5 F (36.9 C) 98.8 F (37.1 C) 99.3 F (37.4 C)  TempSrc:  Oral Oral Oral  SpO2: 100% 100% 97% 98%  Weight:      Height:        Intake/Output Summary (Last 24 hours) at 09/16/2024 1013 Last data filed at 09/16/2024 0445 Gross per 24 hour  Intake --  Output 1201 ml  Net -1201 ml   I/O  Net neg 1.76 L      09/15/2024    1:12 AM 09/14/2024    4:03 PM 08/01/2024   11:13 AM  Last 3 Weights  Weight (lbs) 329 lb 12.9 oz 337 lb 337 lb 6.4 oz  Weight (kg) 149.6 kg 152.862 kg 153.044 kg      Telemetry SR - Personally Reviewed  ECG  No new  - Personally Reviewed  Physical Exam  GEN: Obese 68 yo in no acute distress.   Neck: No JVD   Cardiac: RRR, no murmur  Respiratory: Clear to auscultation GI  Distended   Nontender  MS: Tr  edema;  Labs High Sensitivity Troponin:  No results for input(s): TROPONINIHS in the last 720 hours.    Chemistry Recent Labs  Lab 09/14/24 1630 09/15/24 0741 09/16/24 0529  NA 139 143 138  K 4.1 4.3 4.0  CL 107 108 102  CO2 25 25 25   GLUCOSE 207* 118* 172*  BUN 29* 29* 37*  CREATININE 1.32* 1.19* 1.46*  CALCIUM  7.9* 8.0* 8.1*  MG  --  2.5* 2.5*  PROT  --  7.2  --   ALBUMIN   --  3.4*  --   AST  --  28  --   ALT  --  25  --   ALKPHOS  --  295*  --   BILITOT  --  0.7  --   GFRNONAA 44* 50* 39*  ANIONGAP 8 10 11     Lipids No results for input(s): CHOL, TRIG, HDL, LABVLDL, LDLCALC, CHOLHDL in the last 168 hours.  Hematology Recent Labs  Lab 09/14/24 1630 09/15/24 0528 09/16/24 0529  WBC 6.6 7.4 8.6  RBC 3.93 4.16 3.84*  HGB 9.4* 10.1* 9.5*  HCT 33.3* 35.1* 31.9*  MCV 84.7 84.4 83.1  MCH 23.9* 24.3* 24.7*  MCHC 28.2* 28.8* 29.8*  RDW 15.2 15.5 15.6*  PLT 270 202 290   Thyroid  No results for input(s): TSH, FREET4 in the last 168 hours.  BNP Recent Labs  Lab 09/14/24 1630  PROBNP 1,543.0*    DDimer No results for input(s): DDIMER in the last 168 hours.   Radiology  ECHOCARDIOGRAM COMPLETE Result Date: 09/15/2024    ECHOCARDIOGRAM REPORT   Patient Name:   SHERRELL WEIR Date of Exam: 09/15/2024 Medical Rec #:  [id number]       Height:       65.0 in Accession #:    [id number]      Weight:       329.8 lb Date of Birth:  1956/04/20        BSA:          2.447 m Patient Age:    68 years        BP:           154/74 mmHg Patient Gender: F               HR:           61 bpm. Exam Location:  [facility] Procedure: 2D Echo, Cardiac Doppler, Color Doppler and Intracardiac            Opacification Agent (Both Spectral and Color Flow Doppler were            utilized during procedure). Indications:    CHF-Acute Systolic I50.21  History:        Patient has prior history of Echocardiogram examinations, most                 recent 05/17/2024. CHF and Cardiomyopathy, CAD, TIA; Risk                 Factors:Hypertension, Diabetes, Dyslipidemia, Sleep Apnea and                  Obesity.  Sonographer:    Aida Pizza RCS Referring Phys: [id number] OLADAPO ADEFESO  Sonographer Comments: Technically difficult study due to poor echo windows. Image acquisition challenging due to patient body habitus. IMPRESSIONS  1. Diffuse hypokinesis, worse in the inferior, inferoseptal walls . Left ventricular ejection fraction, by estimation, is 30 to 35%. The left ventricle has moderately decreased function. There is mild concentric left ventricular hypertrophy. Left ventricular diastolic parameters are consistent with Grade I diastolic dysfunction (impaired relaxation).  2. Right ventricular systolic function is low normal. The right ventricular size is normal.  3. Small pericardial effusion posterior to LV . a small pericardial effusion is present.  4. The mitral valve is normal in structure. Trivial mitral valve regurgitation.  5. The aortic valve is tricuspid. Aortic valve regurgitation is not visualized. Aortic valve sclerosis is present, with no evidence of aortic valve stenosis.  6. The inferior vena cava is normal in size with greater than 50% respiratory variability, suggesting right atrial pressure of 3 mmHg. Comparison(s): The left ventricular function is unchanged. FINDINGS  Left Ventricle: Diffuse hypokinesis, worse in the inferior, inferoseptal walls. Left ventricular ejection fraction, by estimation, is 30 to 35%. The left ventricle has moderately decreased function. Definity  contrast agent was given IV to delineate the left ventricular endocardial borders. The left ventricular internal cavity size was normal in size. There is mild concentric left ventricular hypertrophy. Left ventricular diastolic parameters are consistent with Grade I diastolic dysfunction (impaired relaxation). Right Ventricle: The right ventricular size is normal. Right vetricular wall thickness was not assessed. Right ventricular  systolic function is low normal. Left Atrium: Left atrial size was normal in size. Right  Atrium: Right atrial size was normal in size. Pericardium: Small pericardial effusion posterior to LV. A small pericardial effusion is present. Mitral Valve: The mitral valve is normal in structure. Trivial mitral valve regurgitation. Tricuspid Valve: The tricuspid valve is normal in structure. Tricuspid valve regurgitation is trivial. Aortic Valve: The aortic valve is tricuspid. Aortic valve regurgitation is not visualized. Aortic valve sclerosis is present, with no evidence of aortic valve stenosis. Pulmonic Valve: The pulmonic valve was grossly normal. Pulmonic valve regurgitation is not visualized. Aorta: The aortic root is normal in size and structure. Venous: The inferior vena cava is normal in size with greater than 50% respiratory variability, suggesting right atrial pressure of 3 mmHg. IAS/Shunts: No atrial level shunt detected by color flow Doppler.  LEFT VENTRICLE PLAX 2D LVIDd:         6.00 cm   Diastology LVIDs:         4.80 cm   LV e' medial:    3.73 cm/s LV PW:         1.30 cm   LV E/e' medial:  17.2 LV IVS:        1.40 cm   LV e' lateral:   5.35 cm/s LVOT diam:     2.00 cm   LV E/e' lateral: 12.0 LV SV:         55 LV SV Index:   22 LVOT Area:     3.14 cm  RIGHT VENTRICLE RV S prime:     9.32 cm/s TAPSE (M-mode): 2.1 cm LEFT ATRIUM             Index        RIGHT ATRIUM           Index LA diam:        4.00 cm 1.63 cm/m   RA Area:     19.90 cm LA Vol (A2C):   73.0 ml 29.84 ml/m  RA Volume:   58.30 ml  23.83 ml/m LA Vol (A4C):   69.2 ml 28.28 ml/m LA Biplane Vol: 75.3 ml 30.78 ml/m  AORTIC VALVE LVOT Vmax:   84.40 cm/s LVOT Vmean:  57.700 cm/s LVOT VTI:    0.174 m  AORTA Ao Root diam: 3.50 cm MITRAL VALVE MV Area (PHT): 2.56 cm    SHUNTS MV Decel Time: 296 msec    Systemic VTI:  0.17 m MV E velocity: 64.00 cm/s  Systemic Diam: 2.00 cm MV A velocity: 95.20 cm/s MV E/A ratio:  0.67 Vina Gull MD Electronically signed by Vina Gull MD Signature Date/Time: 09/15/2024/4:53:36 PM    Final    DG Chest  2 View Result Date: 09/14/2024 CLINICAL DATA:  SOB EXAM: CHEST - 2 VIEW COMPARISON:  06/05/2024 FINDINGS: Mild diffuse interstitial opacities throughout both lungs. No focal airspace consolidation, pleural effusion, or pneumothorax. Moderate cardiomegaly. No acute fracture or destructive lesions. Multilevel thoracic osteophytosis. Surgical clips in the lower neck. IMPRESSION: Moderate cardiomegaly with findings of interstitial edema. Electronically Signed   By: Rogelia Myers M.D.   On: 09/14/2024 16:58    Cardiac Studies  Echo  09/15/24  1. Diffuse hypokinesis, worse in the inferior, inferoseptal walls . Left  ventricular ejection fraction, by estimation, is 30 to 35%. The left  ventricle has moderately decreased function. There is mild concentric left  ventricular hypertrophy. Left  ventricular diastolic parameters are consistent with Grade I diastolic  dysfunction (impaired  relaxation).   2. Right ventricular systolic function is low normal. The right  ventricular size is normal.   3. Small pericardial effusion posterior to LV . a small pericardial  effusion is present.   4. The mitral valve is normal in structure. Trivial mitral valve  regurgitation.   5. The aortic valve is tricuspid. Aortic valve regurgitation is not  visualized. Aortic valve sclerosis is present, with no evidence of aortic  valve stenosis.   6. The inferior vena cava is normal in size with greater than 50%  respiratory variability, suggesting right atrial pressure of 3 mmHg.   Comparison(s): The left ventricular function is unchanged.   Patient Profile    AFRAH BURLISON is a 68 y.o. female with a hx of pericardial effusion s/p window in 12/2022, HFrEF 2/2 ischemic cardiomyopathy, CAD (s/p 2018 DES to LAD, RCA x2, and medically managed LCX as opposed to CABG), PAD, HTN, T2DM, OSA not on CPAP, CKD and morbid obesity  who is being seen 09/15/2024 for the evaluation of acute CHF at the request of Dr. Leotis.     Assessment & Plan   1  Acute on Chronic HFrEF  pt presented with increaed SOB  Noted missing doses of lasix  and alos admitted to some dietary indiscretion  She has diuresed some   Breathing is better  Hold further IV Lasix  given bump in Cr    Reassess Labs, how feels in am   Consider adding back spironolactone  tomorrow     2  CAD   Triv elevation of Troponin   44, 39   No CP No evid for active ischemia   3  HTN   BP is labile  Overall not bad  4  HL   Continue lipitor  80 mg   5  Hx DVT   On Eliquis  5 bid chronically   6  DM  A1C 7.4    Vina Gull, MD  09/16/2024, 10:13 AM

## 2024-09-16 NOTE — Plan of Care (Signed)

## 2024-09-16 NOTE — Plan of Care (Signed)
  Problem: Education: Goal: Knowledge of General Education information will improve Description: Including pain rating scale, medication(s)/side effects and non-pharmacologic comfort measures Outcome: Progressing   Problem: Clinical Measurements: Goal: Diagnostic test results will improve Outcome: Progressing   Problem: Clinical Measurements: Goal: Cardiovascular complication will be avoided Outcome: Progressing   

## 2024-09-16 NOTE — Progress Notes (Signed)
 PROGRESS NOTE    Maureen Jackson  FMW:[id number] DOB: 02/24/1956 DOA: 09/14/2024 PCP: Orpha Yancey LABOR, MD   Brief Narrative:  This 68 yrs old female with medical history significant of hypertension, T2DM, CAD s/p DES 2018, glaucoma, chronic combined HFrEF and HFpEF, stage IIIa CKD, morbid obesity (BMI 56.6), and OSA who presented to the emergency department with c/o: shortness of breath which worsens with exertion for last 2 days.  She complained of worsening shortness of breath even on laying flat in bed.  Workup in the ED shows proBNP 1543, chest x-ray shows moderate cardiomegaly with findings of interstitial edema.  Patient was admitted for further evaluation and started on IV diuretics.  Ophthalmologist on-call Dr. Valdemar was consulted regarding patient's complaints of left eye pain and recommended patient can be admitted and recommended erythromycin and ofloxacin eyedrops. She  still appears volume overloaded but reports feeling improved.   Assessment & Plan:   Principal Problem:   Acute on chronic combined systolic and diastolic CHF (congestive heart failure) (HCC) Active Problems:   Acquired hypothyroidism   Morbid obesity/BMI > 55   Elevated troponin   Coronary artery disease involving native coronary artery of native heart without angina pectoris   Mixed hyperlipidemia   Acute bacterial conjunctivitis of left eye   Prolonged QT interval   Chronic kidney disease, stage 3b (HCC)   Type 2 diabetes mellitus with hyperglycemia (HCC)   History of DVT (deep vein thrombosis)  Acute on chronic combined systolic and diastolic CHF: Patient presented with progressive shortness of breath, orthopnea, peripheral edema.  proBNP 1,543 Chest x-ray showed moderate cardiomegaly with findings of interstitial edema. Continue total input /output, daily weights and fluid restriction. Continue IV Lasix  40 mg twice daily Continue Jardiance .  Continue Entresto . Continue heart healthy/carb modified diet        Echocardiogram done on 05/17/2024 showed LVEF of 30 to 35%.  LV demonstrated global hypokinesis.  Moderate LVH.  G1 DD.  Echocardiogram in the morning  Cardiology consulted.  She still appears volume overloaded on exam,  Continue IV Lasix  40 mg twice daily.   Presumed acute bacterial conjunctivitis of left eye: Continue erythromycin ophthalmic ointment and ofloxacin per ophthalmologist recommendation.   Elevated troponin possibly secondary to type II demand ischemia: Troponin 44 >> 39.  Patient denies chest pain.     Prolonged QT interval: QTc 519 Avoid QT prolonging drugs Magnesium  normal. Repeat EKG in the morning   Morbid obesity (BMI 54.88) Diet and lifestyle modification Patient may need to follow-up with outpatient PCP for weight loss program.   CAD with prior DES to LAD and RCA 2018/PAD: Continue home medications.  Resumed Toprol  XL.   CKD stage IIIb: Creatinine 1.32 (Baseline creatinine 1-1 0.2) Renally adjust medications, avoid nephrotoxic agents/dehydration/hypotension.   Acquired hypothyroidism: Remote past history of thyroidectomy for benign goiter. Continue levothyroxine  .   Mixed hyperlipidemia: Continue Lipitor .   Type 2 diabetes mellitus with hyperglycemia: Most recent A1c on 05/17/2024 was 7.4 plan Continue Jardiance  Continue Semglee  15 units and adjust dose accordingly Continue ISS and hypoglycemia protocol   Essential hypertension: Continue Entresto , amlodipine , Monitor carefully on IV Lasix  for diuresis   History of DVT: Continue Eliquis .   DVT prophylaxis: Eliquis  Code Status: Full code Family Communication: No family at bedside Disposition Plan:    Status is: Inpatient Remains inpatient appropriate because: Severity of illness   Consultants:  Cardiology  Procedures: Echocardiogram Antimicrobials:  Anti-infectives (From admission, onward)    None  Subjective: Patient was seen and examined at bedside.  Overnight events  noted. Patient was sitting on the bedside, still has basilar crackles.  Objective: Vitals:   09/15/24 0205 09/15/24 0500 09/15/24 2056 09/16/24 0448  BP:  (!) 154/74 107/62 (!) 147/81  Pulse:  61 67 78  Resp:  18 20 18   Temp:  98.5 F (36.9 C) 98.8 F (37.1 C) 99.3 F (37.4 C)  TempSrc:  Oral Oral Oral  SpO2: 100% 100% 97% 98%  Weight:      Height:        Intake/Output Summary (Last 24 hours) at 09/16/2024 1113 Last data filed at 09/16/2024 0445 Gross per 24 hour  Intake --  Output 1201 ml  Net -1201 ml   Filed Weights   09/14/24 1603 09/15/24 0112  Weight: (!) 152.9 kg (!) 149.6 kg    Examination:  General exam: Appears calm and comfortable, not in any acute distress. Respiratory system: Basilar crackles. Respiratory effort normal. RR 17 Cardiovascular system: S1 & S2 heard, RRR. No JVD, murmurs, rubs, gallops or clicks.  Gastrointestinal system: Abdomen is non distended, soft and non tender. Normal bowel sounds heard. Central nervous system: Alert and oriented X 3. No focal neurological deficits. Extremities: Edema+, no cyanosis, no clubbing. Skin: No rashes, lesions or ulcers Psychiatry: Judgement and insight appear normal. Mood & affect appropriate.   Data Reviewed: I have personally reviewed following labs and imaging studies  CBC: Recent Labs  Lab 09/14/24 1630 09/15/24 0528 09/16/24 0529  WBC 6.6 7.4 8.6  HGB 9.4* 10.1* 9.5*  HCT 33.3* 35.1* 31.9*  MCV 84.7 84.4 83.1  PLT 270 202 290   Basic Metabolic Panel: Recent Labs  Lab 09/14/24 1630 09/15/24 0741 09/16/24 0529  NA 139 143 138  K 4.1 4.3 4.0  CL 107 108 102  CO2 25 25 25   GLUCOSE 207* 118* 172*  BUN 29* 29* 37*  CREATININE 1.32* 1.19* 1.46*  CALCIUM  7.9* 8.0* 8.1*  MG  --  2.5* 2.5*  PHOS  --  4.2 4.0   GFR: Estimated Creatinine Clearance: 56.2 mL/min (A) (by C-G formula based on SCr of 1.46 mg/dL (H)). Liver Function Tests: Recent Labs  Lab 09/15/24 0741  AST 28  ALT 25   ALKPHOS 295*  BILITOT 0.7  PROT 7.2  ALBUMIN  3.4*   No results for input(s): LIPASE, AMYLASE in the last 168 hours. No results for input(s): AMMONIA in the last 168 hours. Coagulation Profile: No results for input(s): INR, PROTIME in the last 168 hours. Cardiac Enzymes: No results for input(s): CKTOTAL, CKMB, CKMBINDEX, TROPONINI in the last 168 hours. BNP (last 3 results) Recent Labs    09/14/24 1630  PROBNP 1,543.0*   HbA1C: No results for input(s): HGBA1C in the last 72 hours. CBG: Recent Labs  Lab 09/15/24 0747 09/15/24 1143 09/15/24 1632 09/15/24 2025 09/16/24 0755  GLUCAP 110* 142* 162* 202* 162*   Lipid Profile: No results for input(s): CHOL, HDL, LDLCALC, TRIG, CHOLHDL, LDLDIRECT in the last 72 hours. Thyroid  Function Tests: No results for input(s): TSH, T4TOTAL, FREET4, T3FREE, THYROIDAB in the last 72 hours. Anemia Panel: No results for input(s): VITAMINB12, FOLATE, FERRITIN, TIBC, IRON , RETICCTPCT in the last 72 hours. Sepsis Labs: No results for input(s): PROCALCITON, LATICACIDVEN in the last 168 hours.  No results found for this or any previous visit (from the past 240 hours).   Radiology Studies: ECHOCARDIOGRAM COMPLETE Result Date: 09/15/2024    ECHOCARDIOGRAM REPORT   Patient Name:  Maureen Jackson Date of Exam: 09/15/2024 Medical Rec #:  [id number]       Height:       65.0 in Accession #:    [id number]      Weight:       329.8 lb Date of Birth:  November 17, 1956        BSA:          2.447 m Patient Age:    68 years        BP:           154/74 mmHg Patient Gender: F               HR:           61 bpm. Exam Location:  [facility] Procedure: 2D Echo, Cardiac Doppler, Color Doppler and Intracardiac            Opacification Agent (Both Spectral and Color Flow Doppler were            utilized during procedure). Indications:    CHF-Acute Systolic I50.21  History:        Patient has prior history of Echocardiogram  examinations, most                 recent 05/17/2024. CHF and Cardiomyopathy, CAD, TIA; Risk                 Factors:Hypertension, Diabetes, Dyslipidemia, Sleep Apnea and                 Obesity.  Sonographer:    Aida Pizza RCS Referring Phys: [id number] OLADAPO ADEFESO  Sonographer Comments: Technically difficult study due to poor echo windows. Image acquisition challenging due to patient body habitus. IMPRESSIONS  1. Diffuse hypokinesis, worse in the inferior, inferoseptal walls . Left ventricular ejection fraction, by estimation, is 30 to 35%. The left ventricle has moderately decreased function. There is mild concentric left ventricular hypertrophy. Left ventricular diastolic parameters are consistent with Grade I diastolic dysfunction (impaired relaxation).  2. Right ventricular systolic function is low normal. The right ventricular size is normal.  3. Small pericardial effusion posterior to LV . a small pericardial effusion is present.  4. The mitral valve is normal in structure. Trivial mitral valve regurgitation.  5. The aortic valve is tricuspid. Aortic valve regurgitation is not visualized. Aortic valve sclerosis is present, with no evidence of aortic valve stenosis.  6. The inferior vena cava is normal in size with greater than 50% respiratory variability, suggesting right atrial pressure of 3 mmHg. Comparison(s): The left ventricular function is unchanged. FINDINGS  Left Ventricle: Diffuse hypokinesis, worse in the inferior, inferoseptal walls. Left ventricular ejection fraction, by estimation, is 30 to 35%. The left ventricle has moderately decreased function. Definity  contrast agent was given IV to delineate the left ventricular endocardial borders. The left ventricular internal cavity size was normal in size. There is mild concentric left ventricular hypertrophy. Left ventricular diastolic parameters are consistent with Grade I diastolic dysfunction (impaired relaxation). Right Ventricle: The right  ventricular size is normal. Right vetricular wall thickness was not assessed. Right ventricular systolic function is low normal. Left Atrium: Left atrial size was normal in size. Right Atrium: Right atrial size was normal in size. Pericardium: Small pericardial effusion posterior to LV. A small pericardial effusion is present. Mitral Valve: The mitral valve is normal in structure. Trivial mitral valve regurgitation. Tricuspid Valve: The tricuspid valve is normal in structure. Tricuspid valve regurgitation is trivial. Aortic Valve: The aortic  valve is tricuspid. Aortic valve regurgitation is not visualized. Aortic valve sclerosis is present, with no evidence of aortic valve stenosis. Pulmonic Valve: The pulmonic valve was grossly normal. Pulmonic valve regurgitation is not visualized. Aorta: The aortic root is normal in size and structure. Venous: The inferior vena cava is normal in size with greater than 50% respiratory variability, suggesting right atrial pressure of 3 mmHg. IAS/Shunts: No atrial level shunt detected by color flow Doppler.  LEFT VENTRICLE PLAX 2D LVIDd:         6.00 cm   Diastology LVIDs:         4.80 cm   LV e' medial:    3.73 cm/s LV PW:         1.30 cm   LV E/e' medial:  17.2 LV IVS:        1.40 cm   LV e' lateral:   5.35 cm/s LVOT diam:     2.00 cm   LV E/e' lateral: 12.0 LV SV:         55 LV SV Index:   22 LVOT Area:     3.14 cm  RIGHT VENTRICLE RV S prime:     9.32 cm/s TAPSE (M-mode): 2.1 cm LEFT ATRIUM             Index        RIGHT ATRIUM           Index LA diam:        4.00 cm 1.63 cm/m   RA Area:     19.90 cm LA Vol (A2C):   73.0 ml 29.84 ml/m  RA Volume:   58.30 ml  23.83 ml/m LA Vol (A4C):   69.2 ml 28.28 ml/m LA Biplane Vol: 75.3 ml 30.78 ml/m  AORTIC VALVE LVOT Vmax:   84.40 cm/s LVOT Vmean:  57.700 cm/s LVOT VTI:    0.174 m  AORTA Ao Root diam: 3.50 cm MITRAL VALVE MV Area (PHT): 2.56 cm    SHUNTS MV Decel Time: 296 msec    Systemic VTI:  0.17 m MV E velocity: 64.00 cm/s   Systemic Diam: 2.00 cm MV A velocity: 95.20 cm/s MV E/A ratio:  0.67 Vina Gull MD Electronically signed by Vina Gull MD Signature Date/Time: 09/15/2024/4:53:36 PM    Final    DG Chest 2 View Result Date: 09/14/2024 CLINICAL DATA:  SOB EXAM: CHEST - 2 VIEW COMPARISON:  06/05/2024 FINDINGS: Mild diffuse interstitial opacities throughout both lungs. No focal airspace consolidation, pleural effusion, or pneumothorax. Moderate cardiomegaly. No acute fracture or destructive lesions. Multilevel thoracic osteophytosis. Surgical clips in the lower neck. IMPRESSION: Moderate cardiomegaly with findings of interstitial edema. Electronically Signed   By: Rogelia Myers M.D.   On: 09/14/2024 16:58   Scheduled Meds:  apixaban   5 mg Oral BID   atorvastatin   80 mg Oral Daily   calcium  carbonate  4 tablet Oral BID WC   empagliflozin   10 mg Oral Daily   erythromycin   Both Eyes Once   furosemide   40 mg Intravenous Q12H   insulin  aspart  0-15 Units Subcutaneous TID WC   insulin  aspart  0-5 Units Subcutaneous QHS   insulin  glargine  15 Units Subcutaneous QHS   levothyroxine   200 mcg Oral QAC breakfast   levothyroxine   50 mcg Oral QAC breakfast   metoprolol  succinate  25 mg Oral Daily   ofloxacin  1 drop Both Eyes QID   sacubitril -valsartan   1 tablet Oral BID   Continuous Infusions:  LOS: 1 day    Time spent: 50 mins    Darcel Dawley, MD [facility]   If 7PM-7AM, please contact night-coverage

## 2024-09-17 DIAGNOSIS — I5043 Acute on chronic combined systolic (congestive) and diastolic (congestive) heart failure: Secondary | ICD-10-CM | POA: Diagnosis not present

## 2024-09-17 DIAGNOSIS — N179 Acute kidney failure, unspecified: Secondary | ICD-10-CM

## 2024-09-17 LAB — CBC
HCT: 33.5 % — ABNORMAL LOW (ref 36.0–46.0)
Hemoglobin: 9.7 g/dL — ABNORMAL LOW (ref 12.0–15.0)
MCH: 24.2 pg — ABNORMAL LOW (ref 26.0–34.0)
MCHC: 29 g/dL — ABNORMAL LOW (ref 30.0–36.0)
MCV: 83.5 fL (ref 80.0–100.0)
Platelets: 284 K/uL (ref 150–400)
RBC: 4.01 MIL/uL (ref 3.87–5.11)
RDW: 15.9 % — ABNORMAL HIGH (ref 11.5–15.5)
WBC: 8.3 K/uL (ref 4.0–10.5)
nRBC: 0 % (ref 0.0–0.2)

## 2024-09-17 LAB — BASIC METABOLIC PANEL WITH GFR
Anion gap: 13 (ref 5–15)
BUN: 44 mg/dL — ABNORMAL HIGH (ref 8–23)
CO2: 24 mmol/L (ref 22–32)
Calcium: 8.5 mg/dL — ABNORMAL LOW (ref 8.9–10.3)
Chloride: 103 mmol/L (ref 98–111)
Creatinine, Ser: 1.64 mg/dL — ABNORMAL HIGH (ref 0.44–1.00)
GFR, Estimated: 34 mL/min — ABNORMAL LOW (ref >60)
Glucose, Bld: 185 mg/dL — ABNORMAL HIGH (ref 70–99)
Potassium: 3.8 mmol/L (ref 3.5–5.1)
Sodium: 140 mmol/L (ref 135–145)

## 2024-09-17 LAB — GLUCOSE, CAPILLARY
Glucose-Capillary: 156 mg/dL — ABNORMAL HIGH (ref 70–99)
Glucose-Capillary: 172 mg/dL — ABNORMAL HIGH (ref 70–99)
Glucose-Capillary: 180 mg/dL — ABNORMAL HIGH (ref 70–99)
Glucose-Capillary: 196 mg/dL — ABNORMAL HIGH (ref 70–99)

## 2024-09-17 LAB — LACTIC ACID, PLASMA: Lactic Acid, Venous: 1.2 mmol/L (ref 0.5–1.9)

## 2024-09-17 MED ORDER — BRIMONIDINE TARTRATE-TIMOLOL 0.2-0.5 % OP SOLN: 1.0000 [drp] | Freq: Two times a day (BID) | OPHTHALMIC | Status: DC

## 2024-09-17 MED ORDER — SACUBITRIL-VALSARTAN 97-103 MG PO TABS: 1.0000 | ORAL_TABLET | Freq: Two times a day (BID) | ORAL | Status: DC

## 2024-09-17 MED ORDER — TIMOLOL MALEATE 0.5 % OP SOLN
1.0000 [drp] | Freq: Two times a day (BID) | OPHTHALMIC | Status: DC
  Administered 2024-09-17 – 2024-09-20 (×6): 1 [drp] via OPHTHALMIC
  Filled 2024-09-17: qty 5

## 2024-09-17 MED ORDER — BRIMONIDINE TARTRATE 0.2 % OP SOLN
1.0000 [drp] | Freq: Two times a day (BID) | OPHTHALMIC | Status: DC
  Administered 2024-09-17 – 2024-09-20 (×6): 1 [drp] via OPHTHALMIC
  Filled 2024-09-17: qty 5

## 2024-09-17 NOTE — Progress Notes (Signed)
 Heart Failure Navigator Progress Note Patient is currently already a Advanced Heart Failure Team patient of Ria Commander, MD. Attempted to reach patient on her room phone [phone] at [facility]. No answer at this time.  Tried her cell phone as well with no answer. Reached out to RN on shift today to make sure patient phone was working and within reach.  Patient does currently have a previously scheduled  CHF appointment on 10/07/24 @ 10:20.   Navigator available for reassessment of patient.   Charmaine Pines, RN, BSN [facility] Heart Failure Navigator Secure Chat Only

## 2024-09-17 NOTE — Plan of Care (Signed)
  Problem: Acute Rehab OT Goals (only OT should resolve) Goal: Pt. Will Perform Grooming Flowsheets (Taken 09/17/2024 1326) Pt Will Perform Grooming:  with modified independence  standing Goal: Pt. Will Perform Lower Body Bathing Flowsheets (Taken 09/17/2024 1326) Pt Will Perform Lower Body Bathing:  with modified independence  sitting/lateral leans  with adaptive equipment Goal: Pt. Will Perform Lower Body Dressing Flowsheets (Taken 09/17/2024 1326) Pt Will Perform Lower Body Dressing:  with modified independence  with adaptive equipment  sitting/lateral leans Goal: Pt. Will Transfer To Toilet Flowsheets (Taken 09/17/2024 1326) Pt Will Transfer to Toilet:  with modified independence  ambulating Goal: Pt. Will Perform Toileting-Clothing Manipulation Flowsheets (Taken 09/17/2024 1326) Pt Will Perform Toileting - Clothing Manipulation and hygiene:  with modified independence  sitting/lateral leans  with adaptive equipment Goal: Pt/Caregiver Will Perform Home Exercise Program Flowsheets (Taken 09/17/2024 1326) Pt/caregiver will Perform Home Exercise Program:  Increased strength  Both right and left upper extremity  Independently  Ilya Neely OT, MOT

## 2024-09-17 NOTE — Plan of Care (Signed)
  Problem: Acute Rehab PT Goals(only PT should resolve) Goal: Pt Will Go Supine/Side To Sit Outcome: Progressing Flowsheets (Taken 09/17/2024 1422) Pt will go Supine/Side to Sit:  Independently  with modified independence Goal: Patient Will Transfer Sit To/From Stand Outcome: Progressing Flowsheets (Taken 09/17/2024 1422) Patient will transfer sit to/from stand: with modified independence Goal: Pt Will Transfer Bed To Chair/Chair To Bed Outcome: Progressing Flowsheets (Taken 09/17/2024 1422) Pt will Transfer Bed to Chair/Chair to Bed: with modified independence Goal: Pt Will Ambulate Outcome: Progressing Flowsheets (Taken 09/17/2024 1422) Pt will Ambulate:  75 feet  with modified independence  with rolling walker   2:23 PM, 09/17/24 Lynwood Music, MPT Physical Therapist with [facility] [phone] office [phone] mobile phone

## 2024-09-17 NOTE — Progress Notes (Signed)
 [facility] PROGRESS NOTE  KARIA EHRESMAN (DOB: December 02, 1956) FMW:[id number] PCP: Orpha Yancey LABOR, MD  Brief Narrative: Maureen Jackson is a 68 y.o. female with a history of CAD s/p DES 2018, HTN, T2DM, chronic combined HFrEF, stage IIIa CKD, morbid obesity, OSA, and glaucoma who presented to the ED on 09/14/2024 with exertional dyspnea worsening over 2 days with orthopnea. Workup in the ED shows proBNP 1543, chest x-ray shows moderate cardiomegaly with findings of interstitial edema.  Patient was admitted for further evaluation and started on IV diuretics.  Ophthalmologist on-call Dr. Valdemar was consulted regarding patient's complaints of left eye pain and recommended patient can be admitted and recommended erythromycin and ofloxacin eyedrops. She  still appears volume overloaded but reports feeling improved.   Subjective: Left eye with some green discharge and waxing/waning discomfort that is overall improving, still +wheezing when laying down, resolved when sitting up. No chest pain today.   Objective: BP 115/66 (BP Location: Right Wrist)   Pulse 77   Temp 97.7 F (36.5 C) (Tympanic)   Resp 16   Ht 5' 5 (1.651 m)   Wt (!) 149.4 kg   SpO2 98%   BMI 54.81 kg/m   Gen: Obese female in no distress Eyes: Left eye with conjunctival injection, cornea hazy but PERRL, visual acuity about the same as baseline. Pulm: Clear, nonlabored  CV: RRR, distant, no MRG, trace dependent pitting edema GI: Soft, NT, ND, +BS  Neuro: Alert and oriented. No new focal deficits. Ext: Warm, no deformities. Skin: No new rashes, lesions or ulcers on visualized skin   Assessment & Plan: Principal Problem:   Acute on chronic combined systolic and diastolic CHF (congestive heart failure) (HCC) Active Problems:   Acquired hypothyroidism   Morbid obesity/BMI > 55   Elevated troponin   Coronary artery disease involving native coronary artery of native heart without angina pectoris   Mixed hyperlipidemia    Acute bacterial conjunctivitis of left eye   Prolonged QT interval   Chronic kidney disease, stage 3b (HCC)   Type 2 diabetes mellitus with hyperglycemia (HCC)   History of DVT (deep vein thrombosis)  Acute on chronic combined systolic and diastolic CHF: Patient presented with progressive shortness of breath, orthopnea, peripheral edema.  proBNP 1,543. Chest x-ray showed moderate cardiomegaly with findings of interstitial edema. Echo 10/6: LVEF 30-35%, mild LVH, G1DD, low-normal RVSF, small pericardial effusion (hx pericardial window), unchanged LVSF from prior.  - Good uop yesterday, 1.7L, weight down 152.9kg > 149.4kg, Creatinine bumping today, will hold additional lasix  for now pending cardiology reevaluation.  - Continue Jardiance .  Continue Entresto .    Presumed acute bacterial conjunctivitis of left eye: Pt reports normal tonometry during this episode. Also noted the discomfort she felt resolved with the numbing for that procedure.  - Also has hx glaucoma, I have restarted her chronic gtt's. - Continue erythromycin ophthalmic ointment and ofloxacin per ophthalmologist recommendation. - If not completely improving, would have to entertain alternative etiologies.   Elevated troponin possibly secondary to type II demand ischemia: Troponin 44 >> 39.  Patient denies chest pain.     Prolonged QT interval: QTc 519 Avoid QT prolonging drugs Magnesium  normal.    Morbid obesity (BMI 54.88) Diet and lifestyle modification Patient may need to follow-up with outpatient PCP for weight loss program. - Pt at high risk of deconditioning, reports significant weakness diffusely. PT/OT consulted.    CAD with prior DES to LAD and RCA 2018/PAD: Continue home medications.  Resumed Toprol  XL.  CKD stage IIIb: Creatinine 1.32 (Baseline creatinine 1-1 0.2) Renally adjust medications, avoid nephrotoxic agents/dehydration/hypotension.   Acquired hypothyroidism: Remote past history of thyroidectomy  for benign goiter. Continue levothyroxine  .   Mixed hyperlipidemia: Continue Lipitor .   Type 2 diabetes mellitus with hyperglycemia: Most recent A1c on 05/17/2024 was 7.4 plan Continue Jardiance  Continue Semglee  15 units and adjust dose accordingly Continue ISS and hypoglycemia protocol   Essential hypertension: Continue Entresto , amlodipine , Monitor carefully on IV Lasix  for diuresis   History of DVT: Continue Eliquis .    Bernardino KATHEE Come, MD [facility] [email or web address] 09/17/2024, 12:33 PM

## 2024-09-17 NOTE — Evaluation (Signed)
 Occupational Therapy Evaluation Patient Details Name: Maureen Jackson MRN: [id number] DOB: 11-Jun-1956 Today's Date: 09/17/2024   History of Present Illness   EARLY ORD is a 68 y.o. female with medical history significant of hypertension, T2DM, CAD s/p DES 2018, glaucoma, chronic combined HFrEF and HFpEF, stage IIIa CKD, morbid obesity (BMI 56.6), and OSA who presented to the emergency department from home via EMS due to 2-day onset of shortness of breath which worsens with exertion.  She complained of worsening shortness of breath on laying flat in bed.  She also complained of left eye pain and that she was diagnosed with blood clot behind left eye recently about 2 weeks ago and was scheduled for a follow-up with the retinologist in 3 days to discuss surgical intervention.  She complained of redness of left eye with some pain which is new from last visit to ophthalmology. (per DO)     Clinical Impressions Pt agreeable to OT and PT co-evaluation. Pt at level of mod I for bed mobility today and CGA for transfers with RW. Despite level of CGA pt did require verbal cuing and multiple reps for sit to stand from EOB. RW and grab bar needed to stand from [facility]. Pt reports fear of falling at home with history of 3 falls in 6 months. Pt reports assist for most ADL's at baseline but interest in improving independence with use of AE and DME. Pt given handout on relevant DME and AE that may improve independence. Pt R shoulder was also weak today which the pt reports is new. Pt left in the bed with call bell within reach. Pt will benefit from continued OT in the hospital to increase strength, balance, and endurance for safe ADL's.        If plan is discharge home, recommend the following:   A lot of help with bathing/dressing/bathroom;A little help with walking and/or transfers;Assistance with cooking/housework;Assist for transportation;Help with stairs or ramp for entrance     Functional Status  Assessment   Patient has had a recent decline in their functional status and demonstrates the ability to make significant improvements in function in a reasonable and predictable amount of time.     Equipment Recommendations   BSC/3in1;Tub/shower seat;Other (comment) (AE to assist with lower body ADL tasks.)             Precautions/Restrictions   Precautions Precautions: Fall Recall of Precautions/Restrictions: Intact Restrictions Weight Bearing Restrictions Per Provider Order: No     Mobility Bed Mobility Overal bed mobility: Needs Assistance Bed Mobility: Supine to Sit, Sit to Supine     Supine to sit: Modified independent (Device/Increase time), Used rails Sit to supine: Modified independent (Device/Increase time)   General bed mobility comments: labored movement    Transfers Overall transfer level: Needs assistance Equipment used: Rolling walker (2 wheels) Transfers: Sit to/from Stand Sit to Stand: Contact guard assist           General transfer comment: Pt struggled to stand from EOB as noted by multiple attempts without success until pt was educated by physical therapist to push up from railing rather than the bed. RW and grab needed to stand from toilet.      Balance Overall balance assessment: Needs assistance Sitting-balance support: No upper extremity supported, Feet supported Sitting balance-Leahy Scale: Fair Sitting balance - Comments: fair to good seated at EOB   Standing balance support: Bilateral upper extremity supported, During functional activity, Reliant on assistive device for balance Standing balance-Leahy Scale:  Fair Standing balance comment: using RW                           ADL either performed or assessed with clinical judgement   ADL Overall ADL's : Needs assistance/impaired     Grooming: Set up;Sitting   Upper Body Bathing: Set up;Sitting;Minimal assistance   Lower Body Bathing: Maximal assistance;Total  assistance;Sitting/lateral leans;Bed level   Upper Body Dressing : Set up;Minimal assistance;Sitting   Lower Body Dressing: Maximal assistance;Total assistance;Sitting/lateral leans;Bed level Lower Body Dressing Details (indicate cue type and reason): Pt assited for lower body dressing at baseline. Reports interest in use of AE to improve independence. Toilet Transfer: Contact guard assist;Rolling walker (2 wheels);Ambulation Toilet Transfer Details (indicate cue type and reason): EOB to toilet and back; required use of grab bar on L side and RW on R side to stand from standard toilet. Toileting- Clothing Manipulation and Hygiene: Moderate assistance;Sitting/lateral lean       Functional mobility during ADLs: Contact guard assist;Rolling walker (2 wheels)       Vision Baseline Vision/History: 1 Wears glasses Ability to See in Adequate Light: 1 Impaired Patient Visual Report: No change from baseline Vision Assessment?: No apparent visual deficits     Perception Perception: Not tested       Praxis Praxis: Not tested       Pertinent Vitals/Pain Pain Assessment Pain Assessment: No/denies pain (Pt reported feeling nauseous/ sick to her stomach.)     Extremity/Trunk Assessment Upper Extremity Assessment Upper Extremity Assessment: Generalized weakness;RUE deficits/detail RUE Deficits / Details: 4-/5 shoulder flexion which pt reported is new weakness. Generally weak overall.   Lower Extremity Assessment Lower Extremity Assessment: Defer to PT evaluation   Cervical / Trunk Assessment Cervical / Trunk Assessment: Kyphotic   Communication Communication Communication: No apparent difficulties   Cognition Arousal: Alert Behavior During Therapy: WFL for tasks assessed/performed Cognition: No apparent impairments                               Following commands: Intact       Cueing  General Comments   Cueing Techniques: Verbal cues;Tactile cues                  Home Living Family/patient expects to be discharged to:: Private residence Living Arrangements: Alone Available Help at Discharge: Personal care attendant Type of Home: Apartment Home Access: Level entry     Home Layout: One level     Bathroom Shower/Tub: Chief Strategy Officer: Standard Bathroom Accessibility: No   Home Equipment: Grab bars - tub/shower;Cane - single point;Rollator (4 wheels)   Additional Comments: PCA from 1 to 5PM, 4 days a week.      Prior Functioning/Environment Prior Level of Function : Needs assist;History of Falls (last six months)       Physical Assist : ADLs (physical)   ADLs (physical): Dressing;Bathing;IADLs;Toileting Mobility Comments: Administrator, arts and SP. 3 falls in 6 months. Pt reports having a fear of falling. ADLs Comments: PCA 4 days/week for ADLs and iADLs; pt reports getting a lot of assist for bathing, dressing, and toileting.    OT Problem List: Decreased strength;Decreased activity tolerance;Impaired balance (sitting and/or standing);Decreased knowledge of use of DME or AE;Obesity   OT Treatment/Interventions: Self-care/ADL training;Therapeutic exercise;Therapeutic activities;Patient/family education;Balance training;DME and/or AE instruction      OT Goals(Current goals can be found in  the care plan section)   Acute Rehab OT Goals Patient Stated Goal: improve function OT Goal Formulation: With patient Time For Goal Achievement: 10/01/24 Potential to Achieve Goals: Good   OT Frequency:  Min 2X/week    Co-evaluation PT/OT/SLP Co-Evaluation/Treatment: Yes Reason for Co-Treatment: To address functional/ADL transfers   OT goals addressed during session: ADL's and self-care                       End of Session Equipment Utilized During Treatment: Rolling walker (2 wheels)  Activity Tolerance: Patient tolerated treatment well Patient left: in bed;with call bell/phone  within reach  OT Visit Diagnosis: Other abnormalities of gait and mobility (R26.89);Unsteadiness on feet (R26.81);Muscle weakness (generalized) (M62.81);History of falling (Z91.81);Repeated falls (R29.6)                Time: 8973-8954 OT Time Calculation (min): 19 min Charges:  OT General Charges $OT Visit: 1 Visit OT Evaluation $OT Eval Low Complexity: 1 Low  Keitha Kolk OT, MOT   [name] 09/17/2024, 1:23 PM

## 2024-09-17 NOTE — Patient Instructions (Signed)
 Adaptive Equipment Note: If you have had a hip or knee replaced, dress  your surgical leg first (the side that was operated on).  Undress your surgical leg last. If you have had a hip  replaced, and you have hip precautions, follow your  own hip precautions when using this equipment. Reacher You can use a reacher to pick up light-weight objects  that are out of reach. You can also use it to get dressed: 1. Use the reacher to grasp the waistband of your  pants or skirt. For underwear, grasp around the leg  opening so it will be easy to get your foot through. 2. Lower the clothing to the floor. 3. Put your foot in the leg hole. (For skirts, put both  feet in.) 4. Pull the clothing up so you can reach it with  your hand. 5. If putting on pants or underwear, do the same for  the other leg. Dressing stick You can use a dressing stick to dress the lower part  of your body: 1. Hook the dressing stick through the belt loop at  the front of your pants. Or place the hook inside  the front of the waistband (for pants, skirt or  underwear). 2. Lower the clothing to the floor. 3. Put your foot in. (For skirts, put both feet in.) 4. Pull the clothing up so you can reach it with  your hand. 5. If putting on pants or underwear, do the same for  the other leg. To remove socks: 1. Slide the dressing stick  down into the sock, by  the heel. 2. Push the sock off. Page 2 of 4 Sock aid A sock aid lets you put your socks on without bending. 1. Put your sock on the sock aid. The heel goes on  the bottom. The toe of the sock should be pulled  tight against the sock aid. 2. Hold the straps with  both hands. 3. Lower the sock aid to  the floor. 4. Place your foot into  the sock aid. 5. Point your toes and  pull the straps until your sock is on. Long-handled shoehorn This lets you put your shoes on with  little bending. Simply place the  shoehorn in the heel of the  shoe. Then, slide your  heel  along the shoehorn to slip  your shoe on. Elastic shoelaces With elastic shoelaces,  you can slip your foot  into your shoe to avoid  bending or tying. The first  time you use them: 1. Thread the shoelaces  through your shoes. Be  sure to lace through the  tongue slot, if you have one. 2. Tie a knot or a double bow. Keep the laces tied at  all times. To put on shoes: 1. With the laces tied, slip your foot into the shoe. If  you have a reacher, use it to hold onto the tongue  of your shoe while you put it on. Or use a longhandled shoehorn and slide your foot into the shoe. 2. The laces will stretch as you put your foot in. They  will tighten around your foot once the shoe is on. Leg lifter This is mostly used to move your leg when getting in  and out of bed. If you need a leg lifter, your therapist  will give you one and show you how to use it. Page 3 of 4 Commode A commode can be used in different ways, so there  are different  kinds.  A "stationary" or "bedside" commode is used if  you have trouble getting to the bathroom toilet.  This commode is placed near the bed and has a  bucket that can be emptied into the toilet.  You can use an "all-in-one" or "3-in-1" commode  either at your bedside or in the bathroom. The  back is open, so you can remove the bucket and  place the commode  over the toilet as  needed. Putting it  over the toilet raises  the seat and gives you  handrails, making it  easier to sit down and  get up. You can make the legs of  either commode shorter or taller. Raised toilet seat A raised seat makes it easier to get on and off the  toilet. This is helpful to many people, including those  who have had a hip replaced. It keeps you from  bending too much at the hips as  you lower yourself onto the seat. It  also helps if you have trouble with  strength or balance when getting  on the toilet. Toilet safety frame With a safety frame, you   can push up from the  rails when getting on  and off the toilet. The  frame attaches to the  screws on your toilet  seat cover. You can make  the legs shorter or taller  as needed. Toilet aid If you have problems bending, twisting or reaching,  you can use a toilet aid to wipe yourself after using  the toilet. 1. Roll the toilet paper into a ball. 2. Grip part of the ball with the toilet aid. 3. Wipe. 4. Open the handle to drop  the used tissue into  the toilet. Grab bars You can install grab bars near your toilet and in your  bathtub or shower. This will give you something stable  to hold onto. Towel bars, soap dishes and shower  doors are not strong enough to hold your weight. Grab bars come in different sizes  and angles. Some clamp onto  the edge of the tub; others are  mounted to the studs in the wall. Page 4 of 4 Long-handled sponge It you have trouble bending  or twisting, the sponge will  help you wash your back, feet  and lower legs. Tub transfer bench  (also called an extended tub bench) A tub transfer bench is for anyone who has trouble  stepping into or out of the bathtub or who has  weight-bearing restrictions. 1. Walk up to the edge of the tub and turn around.  You should feel the bench at the back of  your knees. 2. Sit down on  the bench. 3. Staying seated, start  turning to face the  front of the tub. 4. Lift your feet over  the edge of the tub,  one at a time. (If you  have had your hip  replaced, do not lift  your knee higher than your hip.) 5. Reverse this process to get out of the tub. Tub or shower chair A tub or shower chair can help if  you have problems standing in  the shower or sitting at the  bottom of a bathtub. It  lets you sit on the chair  while you bathe. You  can use a hand-held  shower head along with  the chair. Hand-held shower head This lets you control the spray of  the water  as you bathe. If you are  using a  shower chair or tub transfer  bench, you may need a hand-held  shower head. Therapist's name and phone number: __________________________________________ Comments: __________________________________________ __________________________________________ _________________________________________

## 2024-09-17 NOTE — Progress Notes (Signed)
 Progress Note  Patient Name: Maureen Jackson Date of Encounter: 09/17/2024  Primary Cardiologist: Alvan Carrier, MD  Subjective   Worried about softer blood pressures.  Inpatient Medications    Scheduled Meds:  apixaban   5 mg Oral BID   atorvastatin   80 mg Oral Daily   brimonidine   1 drop Both Eyes BID   And   timolol   1 drop Both Eyes BID   calcium  carbonate  4 tablet Oral BID WC   empagliflozin   10 mg Oral Daily   erythromycin   Both Eyes Once   insulin  aspart  0-15 Units Subcutaneous TID WC   insulin  aspart  0-5 Units Subcutaneous QHS   insulin  glargine  15 Units Subcutaneous QHS   levothyroxine   200 mcg Oral QAC breakfast   levothyroxine   50 mcg Oral QAC breakfast   metoprolol  succinate  25 mg Oral Daily   ofloxacin  1 drop Both Eyes QID   Continuous Infusions:  PRN Meds: acetaminophen  **OR** acetaminophen , artificial tears, hydrOXYzine, prochlorperazine    Vital Signs    Vitals:   09/16/24 1948 09/17/24 0412 09/17/24 1300 09/17/24 1400  BP: 128/67 115/66 (!) 86/50 (!) 101/52  Pulse: 76 77 71 70  Resp: 18 16 17    Temp: (!) 97.5 F (36.4 C) 97.7 F (36.5 C) 98.5 F (36.9 C)   TempSrc: Oral Tympanic Oral   SpO2: 98% 98% 97%   Weight:  (!) 149.4 kg    Height:        Intake/Output Summary (Last 24 hours) at 09/17/2024 1524 Last data filed at 09/17/2024 0414 Gross per 24 hour  Intake 240 ml  Output 700 ml  Net -460 ml   Filed Weights   09/14/24 1603 09/15/24 0112 09/17/24 0412  Weight: (!) 152.9 kg (!) 149.6 kg (!) 149.4 kg    Telemetry     Personally reviewed.  Not on telemetry.  ECG    Not performed today.  Physical Exam   GEN: No acute distress.   Neck: Unable to examine due to body habitus. Cardiac: RRR, no murmur, rub, or gallop.  Respiratory: Nonlabored. Clear to auscultation bilaterally. GI: Soft, nontender, bowel sounds present. MS: No edema; No deformity. Neuro:  Nonfocal. Psych: Alert and oriented x 3. Normal  affect.  Labs    Chemistry Recent Labs  Lab 09/15/24 0741 09/16/24 0529 09/17/24 0616  NA 143 138 140  K 4.3 4.0 3.8  CL 108 102 103  CO2 25 25 24   GLUCOSE 118* 172* 185*  BUN 29* 37* 44*  CREATININE 1.19* 1.46* 1.64*  CALCIUM  8.0* 8.1* 8.5*  PROT 7.2  --   --   ALBUMIN  3.4*  --   --   AST 28  --   --   ALT 25  --   --   ALKPHOS 295*  --   --   BILITOT 0.7  --   --   GFRNONAA 50* 39* 34*  ANIONGAP 10 11 13      Hematology Recent Labs  Lab 09/15/24 0528 09/16/24 0529 09/17/24 0616  WBC 7.4 8.6 8.3  RBC 4.16 3.84* 4.01  HGB 10.1* 9.5* 9.7*  HCT 35.1* 31.9* 33.5*  MCV 84.4 83.1 83.5  MCH 24.3* 24.7* 24.2*  MCHC 28.8* 29.8* 29.0*  RDW 15.5 15.6* 15.9*  PLT 202 290 284    Cardiac EnzymesNo results for input(s): TROPONINIHS in the last 720 hours.  BNP Recent Labs  Lab 09/14/24 1630  PROBNP 1,543.0*     DDimerNo  results for input(s): DDIMER in the last 168 hours.   Radiology    No results found.  Assessment & Plan    Acute on chronic systolic and diastolic heart failure exacerbation Acute kidney injury  - Presented with DOE, chills and myalgias for couple of days prior to presentation.  BNP mildly elevated, 1543.  Chest x-ray showed pulmonary vascular congestion and moderate cardiomegaly.  She was started on IV diuresis but had to be discontinued due to AKI.  Serum creatinine elevated to 1.64 this a.m.  Continue to hold diuretics.  Echo this admission showed LVEF 30 to 35%, low normal RV function and CVP 3 mmHg.  She may not need to continue diuretics at this time. - She has AKI, hold SGLT2 elevators and Entresto .  Her blood pressure also dropping. - Peripheries are cold, obtain lactic acid.  30 minutes spent in reviewing prior records, specialist notes, more than 3 labs, discussion of the above problems with the patient and documentation.  Signed, Maureen SHAUNNA Maywood, MD  09/17/2024, 3:24 PM

## 2024-09-17 NOTE — Evaluation (Signed)
 Physical Therapy Evaluation Patient Details Name: [name] MRN: [id number] DOB: [birth year] Today's Date: 09/17/2024  History of Present Illness  Maureen Jackson is a 68 y.o. female with medical history significant of hypertension, T2DM, CAD s/p DES 2018, glaucoma, chronic combined HFrEF and HFpEF, stage IIIa CKD, morbid obesity (BMI 56.6), and OSA who presented to the emergency department from home via EMS due to 2-day onset of shortness of breath which worsens with exertion.  She complained of worsening shortness of breath on laying flat in bed.  She also complained of left eye pain and that she was diagnosed with blood clot behind left eye recently about 2 weeks ago and was scheduled for a follow-up with the retinologist in 3 days to discuss surgical intervention.  She complained of redness of left eye with some pain which is new from last visit to ophthalmology.   Clinical Impression  Patient had difficulty completing sit to stands from bedside due to weakness, good return for transferring to/from bariatric commode in room, able to ambulate into hallway without loss of balance, but limited mostly due to c/o fatigue. Patient tolerated sitting up at bedside after therapy. Patient will benefit from continued skilled physical therapy in hospital and recommended venue below to increase strength, balance, endurance for safe ADLs and gait.          If plan is discharge home, recommend the following: A little help with walking and/or transfers;A little help with bathing/dressing/bathroom;Help with stairs or ramp for entrance;Assist for transportation;Assistance with cooking/housework   Can travel by private vehicle        Equipment Recommendations None recommended by PT  Recommendations for Other Services       Functional Status Assessment Patient has had a recent decline in their functional status and demonstrates the ability to make significant improvements in function in a reasonable and  predictable amount of time.     Precautions / Restrictions Precautions Precautions: Fall Recall of Precautions/Restrictions: Intact Restrictions Weight Bearing Restrictions Per Provider Order: No      Mobility  Bed Mobility Overal bed mobility: Needs Assistance Bed Mobility: Supine to Sit, Sit to Supine     Supine to sit: Modified independent (Device/Increase time), Used rails Sit to supine: Modified independent (Device/Increase time)   General bed mobility comments: slightly increased time with labored movement    Transfers Overall transfer level: Needs assistance Equipment used: Rolling walker (2 wheels) Transfers: Sit to/from Stand Sit to Stand: Contact guard assist           General transfer comment: requires repeated attempts before able to complete sit to stands requiring bed rail and using RW    Ambulation/Gait Ambulation/Gait assistance: Supervision Gait Distance (Feet): 45 Feet Assistive device: Rolling walker (2 wheels) Gait Pattern/deviations: Decreased step length - right, Decreased step length - left, Decreased stride length, Trunk flexed Gait velocity: decreased     General Gait Details: slightly labored slow movement without loss of balance using RW and limited mostly due to c/o fatigue  Stairs            Wheelchair Mobility     Tilt Bed    Modified Rankin (Stroke Patients Only)       Balance Overall balance assessment: Needs assistance Sitting-balance support: Feet supported, No upper extremity supported Sitting balance-Leahy Scale: Fair Sitting balance - Comments: fair to good seated at EOB   Standing balance support: Bilateral upper extremity supported, During functional activity, Reliant on assistive device for balance Standing balance-Leahy  Scale: Fair Standing balance comment: using RW                             Pertinent Vitals/Pain Pain Assessment Pain Assessment: No/denies pain    Home Living  Family/patient expects to be discharged to:: Private residence Living Arrangements: Alone Available Help at Discharge: Personal care attendant Type of Home: Apartment Home Access: Level entry       Home Layout: One level Home Equipment: Grab bars - tub/shower;Cane - single point;Rollator (4 wheels) Additional Comments: PCA from 1 to 5PM, 4 days a week.    Prior Function Prior Level of Function : Needs assist;History of Falls (last six months)       Physical Assist : ADLs (physical);Mobility (physical) Mobility (physical): Bed mobility;Transfers;Gait;Stairs ADLs (physical): Dressing;Bathing;IADLs;Toileting Mobility Comments: Administrator, arts and SP. 3 falls in 6 months. Pt reports having a fear of falling. ADLs Comments: PCA 4 days/week for ADLs and iADLs; pt reports getting a lot of assist for bathing, dressing, and toileting.     Extremity/Trunk Assessment   Upper Extremity Assessment Upper Extremity Assessment: Defer to OT evaluation RUE Deficits / Details: 4-/5 shoulder flexion which pt reported is new weakness. Generally weak overall.    Lower Extremity Assessment Lower Extremity Assessment: Generalized weakness    Cervical / Trunk Assessment Cervical / Trunk Assessment: Kyphotic  Communication   Communication Communication: No apparent difficulties    Cognition Arousal: Alert Behavior During Therapy: WFL for tasks assessed/performed   PT - Cognitive impairments: No apparent impairments                         Following commands: Intact       Cueing Cueing Techniques: Verbal cues, Tactile cues     General Comments      Exercises     Assessment/Plan    PT Assessment Patient needs continued PT services  PT Problem List Decreased strength;Decreased activity tolerance;Decreased balance;Decreased mobility       PT Treatment Interventions DME instruction;Gait training;Stair training;Functional mobility training;Therapeutic  activities;Therapeutic exercise;Balance training;Patient/family education    PT Goals (Current goals can be found in the Care Plan section)  Acute Rehab PT Goals Patient Stated Goal: return home with home aides to assist PT Goal Formulation: With patient Time For Goal Achievement: 10/12/24 Potential to Achieve Goals: Good    Frequency Min 3X/week     Co-evaluation PT/OT/SLP Co-Evaluation/Treatment: Yes Reason for Co-Treatment: To address functional/ADL transfers PT goals addressed during session: Mobility/safety with mobility;Balance;Proper use of DME OT goals addressed during session: ADL's and self-care       AM-PAC PT 6 Clicks Mobility  Outcome Measure Help needed turning from your back to your side while in a flat bed without using bedrails?: None Help needed moving from lying on your back to sitting on the side of a flat bed without using bedrails?: None Help needed moving to and from a bed to a chair (including a wheelchair)?: A Little Help needed standing up from a chair using your arms (e.g., wheelchair or bedside chair)?: A Little Help needed to walk in hospital room?: A Little Help needed climbing 3-5 steps with a railing? : A Lot 6 Click Score: 19    End of Session   Activity Tolerance: Patient tolerated treatment well;Patient limited by fatigue Patient left: in bed;with call bell/phone within reach Nurse Communication: Mobility status PT Visit Diagnosis: Unsteadiness on feet (R26.81);Other  abnormalities of gait and mobility (R26.89);Muscle weakness (generalized) (M62.81)    Time: 8976-8956 PT Time Calculation (min) (ACUTE ONLY): 20 min   Charges:   PT Evaluation $PT Eval Moderate Complexity: 1 Mod PT Treatments $Therapeutic Activity: 8-22 mins PT General Charges $$ ACUTE PT VISIT: 1 Visit         2:21 PM, 09/17/24 Lynwood Music, MPT Physical Therapist with [facility] [phone] office [phone] mobile phone

## 2024-09-17 NOTE — Care Management Important Message (Signed)
 Important Message  Patient Details  Name: Maureen Jackson MRN: [id number] Date of Birth: May 19, 1956   Important Message Given:  N/A - LOS <3 / Initial given by admissions     Duwaine LITTIE Ada 09/17/2024, 11:55 AM

## 2024-09-17 NOTE — TOC Progression Note (Signed)
 DME Note   Patient Details  Name: Maureen Jackson MRN: [id number] Date of Birth: 07/31/1956  Transition of Care [facility]) CM/SW Contact  Sharlyne Stabs, RN Phone Number: 09/17/2024, 2:40 PM  BSC needed, patient is confined to one level of the home environment and there is no toilet on that level. Patient weight is great that 325lbs with body habitus a bariatric commode is needed.     Expected Discharge Plan: Home/Self Care Barriers to Discharge: Continued Medical Work up

## 2024-09-18 DIAGNOSIS — N183 Chronic kidney disease, stage 3 unspecified: Secondary | ICD-10-CM

## 2024-09-18 DIAGNOSIS — E890 Postprocedural hypothyroidism: Secondary | ICD-10-CM | POA: Diagnosis not present

## 2024-09-18 DIAGNOSIS — I5043 Acute on chronic combined systolic (congestive) and diastolic (congestive) heart failure: Secondary | ICD-10-CM | POA: Diagnosis not present

## 2024-09-18 DIAGNOSIS — N179 Acute kidney failure, unspecified: Secondary | ICD-10-CM | POA: Diagnosis not present

## 2024-09-18 LAB — BASIC METABOLIC PANEL WITH GFR
Anion gap: 10 (ref 5–15)
BUN: 52 mg/dL — ABNORMAL HIGH (ref 8–23)
CO2: 27 mmol/L (ref 22–32)
Calcium: 8.6 mg/dL — ABNORMAL LOW (ref 8.9–10.3)
Chloride: 100 mmol/L (ref 98–111)
Creatinine, Ser: 1.77 mg/dL — ABNORMAL HIGH (ref 0.44–1.00)
GFR, Estimated: 31 mL/min — ABNORMAL LOW (ref >60)
Glucose, Bld: 159 mg/dL — ABNORMAL HIGH (ref 70–99)
Potassium: 4.1 mmol/L (ref 3.5–5.1)
Sodium: 137 mmol/L (ref 135–145)

## 2024-09-18 LAB — GLUCOSE, CAPILLARY
Glucose-Capillary: 157 mg/dL — ABNORMAL HIGH (ref 70–99)
Glucose-Capillary: 177 mg/dL — ABNORMAL HIGH (ref 70–99)
Glucose-Capillary: 161 mg/dL — ABNORMAL HIGH (ref 70–99)
Glucose-Capillary: 173 mg/dL — ABNORMAL HIGH (ref 70–99)

## 2024-09-18 LAB — TSH: TSH: 11.8 u[IU]/mL — ABNORMAL HIGH (ref 0.350–4.500)

## 2024-09-18 MED ORDER — LEVOTHYROXINE SODIUM 75 MCG PO TABS
75.0000 ug | ORAL_TABLET | Freq: Every day | ORAL | Status: DC
  Administered 2024-09-19 – 2024-09-20 (×2): 75 ug via ORAL
  Filled 2024-09-18 (×2): qty 1

## 2024-09-18 MED ORDER — OXYCODONE-ACETAMINOPHEN 5-325 MG PO TABS
1.0000 | ORAL_TABLET | Freq: Once | ORAL | Status: AC
  Administered 2024-09-19: 1 via ORAL
  Filled 2024-09-18: qty 1

## 2024-09-18 MED ORDER — OXYCODONE HCL 5 MG PO TABS: 5.0000 mg | ORAL_TABLET | Freq: Once | ORAL | Status: DC

## 2024-09-18 NOTE — Plan of Care (Signed)
  Problem: Pain Managment: Goal: General experience of comfort will improve and/or be controlled Outcome: Progressing   Problem: Safety: Goal: Ability to remain free from injury will improve Outcome: Progressing

## 2024-09-18 NOTE — Progress Notes (Signed)
 [facility] PROGRESS NOTE  Maureen Jackson (DOB: [birth year]) FMW:[id number] PCP: Orpha Yancey LABOR, MD  Brief Narrative: Maureen Jackson is a 68 y.o. female with a history of CAD s/p DES 2018, HTN, T2DM, chronic combined HFrEF, stage IIIa CKD, morbid obesity, OSA, and glaucoma who presented to the ED on 09/14/2024 with exertional dyspnea worsening over 2 days with orthopnea. Workup in the ED shows proBNP 1543, chest x-ray shows moderate cardiomegaly with findings of interstitial edema.  Patient was admitted for further evaluation and started on IV diuretics.  Ophthalmologist on-call Dr. Valdemar was consulted regarding patient's complaints of left eye pain and recommended patient can be admitted and recommended erythromycin and ofloxacin eyedrops. She improved but suffered AKI.  Subjective: Eye feels much better, some radiation of discomfort to the ear. No chest pain or dyspnea currently, just feels worn out.   Objective: BP (!) 107/50 (BP Location: Right Wrist)   Pulse 79   Temp 98.3 F (36.8 C)   Resp 18   Ht 5' 5 (1.651 m)   Wt (!) 150.6 kg   SpO2 90%   BMI 55.25 kg/m   Gen: No distress Eyes: Left cornea less hazy, acuity at baseline, erythema improved. No discharge.  Pulm: Clear, nonlabored  CV: RRR, no MRG or edema GI: Soft, NT, ND, +BS  Neuro: Alert and oriented. No new focal deficits. Ext: Warm, no deformities. Skin: No new rashes, lesions or ulcers on visualized skin   Assessment & Plan: Principal Problem:   Acute on chronic combined systolic and diastolic CHF (congestive heart failure) (HCC) Active Problems:   Acquired hypothyroidism   Morbid obesity/BMI > 55   Elevated troponin   Coronary artery disease involving native coronary artery of native heart without angina pectoris   Mixed hyperlipidemia   Acute bacterial conjunctivitis of left eye   Prolonged QT interval   Chronic kidney disease, stage 3b (HCC)   Type 2 diabetes mellitus with hyperglycemia (HCC)    History of DVT (deep vein thrombosis)   AKI (acute kidney injury)  Acute on chronic combined systolic and diastolic CHF: Patient presented with progressive shortness of breath, orthopnea, peripheral edema.  proBNP 1,543. Chest x-ray showed moderate cardiomegaly with findings of interstitial edema. Echo 10/6: LVEF 30-35%, mild LVH, G1DD, low-normal RVSF, small pericardial effusion (hx pericardial window), unchanged LVSF from prior.  - Good uop yesterday, 1.7L, weight down 152.9kg > 149.4kg, Creatinine bumping today, will hold additional lasix  for now pending cardiology reevaluation.  - Have to hold further diuresis, entresto , jardiance .  - Hypotension noted > hold BP meds. Lactic acid reassuring.    Presumed acute bacterial conjunctivitis of left eye: Pt reports normal tonometry during this episode. Also noted the discomfort she felt resolved with the numbing for that procedure.  - Also has hx glaucoma, I have restarted her chronic gtt's which has greatly improved symptoms as well.  - Continue erythromycin ophthalmic ointment and ofloxacin per ophthalmologist recommendation.    Elevated troponin possibly secondary to type II demand ischemia: Troponin 44 >> 39.  Patient denies chest pain.     Prolonged QT interval: QTc 519 Avoid QT prolonging drugs Magnesium  normal.    Morbid obesity (BMI 54.88) Diet and lifestyle modification Patient may need to follow-up with outpatient PCP for weight loss program. - Pt at high risk of deconditioning, reports significant weakness diffusely. PT/OT consulted.    CAD with prior DES to LAD and RCA 2018/PAD: Continue home medications.  Resumed Toprol  XL.   CKD stage  IIIb now with AKI: Due to hypotension, impaired renal autoregulation, decreased EABV from diuresis.  - Recheck BMP in AM, still making good UOP. Consider further work up if holding BP meds and diuretic don't help.    Acquired hypothyroidism: Remote past history of thyroidectomy for benign  goiter. Continue levothyroxine  at high dose 250mcg daily. TSH slightly elevated, will discuss with patient and likely increase dose by 10%.    Mixed hyperlipidemia: Continue Lipitor .   Type 2 diabetes mellitus with hyperglycemia: Most recent A1c on 05/17/2024 was 7.4% - Continue SSI, glargine - Hold SGLT2i as above   Essential hypertension: Continue Entresto , amlodipine , Monitor carefully on IV Lasix  for diuresis   History of DVT: Continue Eliquis .    Maureen KATHEE Come, MD [facility] [email or web address] 09/18/2024, 7:01 PM

## 2024-09-18 NOTE — Progress Notes (Signed)
 Heart Failure Navigator Progress Note  Education Assessment and Provision:  Detailed education and instructions provided on heart failure disease management reviewed with patient over the phone remotely to [facility] 325 including the following:  Signs and symptoms of Heart Failure When to call the physician Importance of daily weights Low sodium diet Fluid restriction Medication management Anticipated future follow-up appointments.  Patient has a prior scheduled follow-up appointment with Dr. Gardenia on 10/07/24 @ 10:20.  Patient education reviewed on each of the above topics.  Patient acknowledges understanding via teach back method and acceptance of all instructions.  Education Materials:  Living Better With Heart Failure Booklet, HF zone tool, & Daily Weight Tracker Tool.  Patient has scale at home: No. Will ask social worker to deliver one to her bedside. Patient has pill box at home: Will have social worker check on this as well.   Navigator will notify Buel (already established with patient) from [facility] in [place] to schedule a follow-up with patient in her home after discharge.    Charmaine Pines, RN, BSN [facility] Heart Failure Navigator Secure Chat Only

## 2024-09-18 NOTE — Progress Notes (Addendum)
 Progress Note  Patient Name: Maureen Jackson Date of Encounter: 09/18/2024  Primary Cardiologist: Alvan Carrier, MD  Subjective   Blood pressures soft.  Entresto  metoprolol  held.  Inpatient Medications    Scheduled Meds:  apixaban   5 mg Oral BID   atorvastatin   80 mg Oral Daily   brimonidine   1 drop Both Eyes BID   And   timolol   1 drop Both Eyes BID   calcium  carbonate  4 tablet Oral BID WC   erythromycin   Both Eyes Once   insulin  aspart  0-15 Units Subcutaneous TID WC   insulin  aspart  0-5 Units Subcutaneous QHS   insulin  glargine  15 Units Subcutaneous QHS   levothyroxine   200 mcg Oral QAC breakfast   levothyroxine   50 mcg Oral QAC breakfast   metoprolol  succinate  25 mg Oral Daily   ofloxacin  1 drop Both Eyes QID   Continuous Infusions:  PRN Meds: acetaminophen  **OR** acetaminophen , artificial tears, hydrOXYzine, prochlorperazine    Vital Signs    Vitals:   09/17/24 1400 09/17/24 2004 09/17/24 2035 09/18/24 0320  BP: (!) 101/52 (!) 83/55 (!) 102/58 112/69  Pulse: 70 69  74  Resp:  16  18  Temp:  98.2 F (36.8 C)  98.2 F (36.8 C)  TempSrc:  Oral  Oral  SpO2:  98%  97%  Weight:    (!) 150.6 kg  Height:        Intake/Output Summary (Last 24 hours) at 09/18/2024 1220 Last data filed at 09/18/2024 0900 Gross per 24 hour  Intake 540 ml  Output --  Net 540 ml   Filed Weights   09/15/24 0112 09/17/24 0412 09/18/24 0320  Weight: (!) 149.6 kg (!) 149.4 kg (!) 150.6 kg    Telemetry     Personally reviewed.  Not on telemetry.  ECG    Not performed today.  Physical Exam   GEN: No acute distress.   Neck: Unable to examine due to body habitus. Cardiac: RRR, no murmur, rub, or gallop.  Respiratory: Nonlabored. Clear to auscultation bilaterally. GI: Soft, nontender, bowel sounds present. MS: No edema; No deformity. Neuro:  Nonfocal. Psych: Alert and oriented x 3. Normal affect.  Labs    Chemistry Recent Labs  Lab 09/15/24 0741  09/16/24 0529 09/17/24 0616 09/18/24 0548  NA 143 138 140 137  K 4.3 4.0 3.8 4.1  CL 108 102 103 100  CO2 25 25 24 27   GLUCOSE 118* 172* 185* 159*  BUN 29* 37* 44* 52*  CREATININE 1.19* 1.46* 1.64* 1.77*  CALCIUM  8.0* 8.1* 8.5* 8.6*  PROT 7.2  --   --   --   ALBUMIN  3.4*  --   --   --   AST 28  --   --   --   ALT 25  --   --   --   ALKPHOS 295*  --   --   --   BILITOT 0.7  --   --   --   GFRNONAA 50* 39* 34* 31*  ANIONGAP 10 11 13 10      Hematology Recent Labs  Lab 09/15/24 0528 09/16/24 0529 09/17/24 0616  WBC 7.4 8.6 8.3  RBC 4.16 3.84* 4.01  HGB 10.1* 9.5* 9.7*  HCT 35.1* 31.9* 33.5*  MCV 84.4 83.1 83.5  MCH 24.3* 24.7* 24.2*  MCHC 28.8* 29.8* 29.0*  RDW 15.5 15.6* 15.9*  PLT 202 290 284    Cardiac EnzymesNo results for input(s): TROPONINIHS in  the last 720 hours.  BNP Recent Labs  Lab 09/14/24 1630  PROBNP 1,543.0*     DDimerNo results for input(s): DDIMER in the last 168 hours.   Radiology    No results found.  Assessment & Plan    Acute on chronic systolic and diastolic heart failure exacerbation CKD Hypothyroidism  - Presented with DOE, chills and myalgias for couple of days prior to presentation.  BNP mildly elevated, 1543.  Chest x-ray showed pulmonary vascular congestion and moderate cardiomegaly.  She was started on IV diuresis but had to be discontinued due to AKI.  Serum creatinine elevated to 1.7 this a.m. gradually trending up.  Continue to hold diuretics.  Echo this admission showed LVEF 30 to 35%, low normal RV function and CVP 3 mmHg.  She may not need to continue diuretics at this time.  Moreover, her serum creatinine has been fluctuating for the last 1 year.  No definite baseline has been established yet. - Due to soft blood pressures, Entresto  and metoprolol  on hold. - She is on Jardiance  10 mg as well as 25 mg at home.  Need to stop Jardiance  10 mg upon discharge. - Lactic acid within normal limits. - Patient reported continued  SOB, fatigue and now has low blood pressures limiting GDMT initiation.  Per chart review, she had severe hypothyroidism in June 2025.  TSH added today.  30 minutes spent in reviewing prior records, specialist notes, more than 3 labs, discussion of the above problems with the patient and documentation.  Signed, Diannah SHAUNNA Maywood, MD  09/18/2024, 12:20 PM

## 2024-09-18 NOTE — TOC Progression Note (Signed)
 Transition of Care [facility]) - Progression Note    Patient Details  Name: Maureen Jackson MRN: [id number] Date of Birth: 05/07/1956  [facility]) CM/SW Contact  Sharlyne Stabs, RN Phone Number: 09/18/2024, 11:06 AM  Clinical Narrative:   CM at the bedside. Patient lives in an apartment, she has and CAP aide 5 hours a day, 4 days a week.  Bariatric [facility] referral sent to [facility], they will deliver to the home. PT is recommending HHPT, patient is agreeable. No preferences, referral given to Selinda with [facility], MD placed orders. Planning to discharge home tomorrow.    Expected Discharge Plan: Home w Home Health Services Barriers to Discharge: Continued Medical Work up      Expected Discharge Plan and Services In-house Referral: Clinical Social Work   Post Acute Care Choice: Durable Medical Equipment Living arrangements for the past 2 months: Single Family Home           HH Arranged: PT HH Agency: [facility] Date HH Agency Contacted: 09/18/24 Time HH Agency Contacted: 0930 Representative spoke with at [facility] Agency: Selinda   Social Drivers of Health (SDOH) Interventions SDOH Screenings   Food Insecurity: No Food Insecurity (09/15/2024)  Housing: Low Risk  (09/15/2024)  Transportation Needs: Unmet Transportation Needs (09/15/2024)  Utilities: Not At Risk (09/15/2024)  Alcohol Screen: Low Risk  (12/22/2022)  Depression (PHQ2-9): Low Risk  (01/01/2020)  Financial Resource Strain: Low Risk  (05/19/2024)  Social Connections: Unknown (09/15/2024)  Tobacco Use: Medium Risk (09/15/2024)    Readmission Risk Interventions    09/15/2024   11:39 AM 05/22/2024    2:03 PM 05/22/2024   12:06 PM  Readmission Risk Prevention Plan  Transportation Screening Complete Complete Complete  Home Care Screening  Complete Complete  Medication Review (RN CM)  Complete Complete  HRI or Home Care Consult Complete    Social Work Consult for Recovery Care Planning/Counseling Complete     Palliative Care Screening Not Applicable    Medication Review Oceanographer) Complete

## 2024-09-19 DIAGNOSIS — E890 Postprocedural hypothyroidism: Secondary | ICD-10-CM | POA: Diagnosis not present

## 2024-09-19 DIAGNOSIS — I5043 Acute on chronic combined systolic (congestive) and diastolic (congestive) heart failure: Secondary | ICD-10-CM | POA: Diagnosis not present

## 2024-09-19 DIAGNOSIS — N183 Chronic kidney disease, stage 3 unspecified: Secondary | ICD-10-CM | POA: Diagnosis not present

## 2024-09-19 LAB — BASIC METABOLIC PANEL WITH GFR
Anion gap: 9 (ref 5–15)
BUN: 56 mg/dL — ABNORMAL HIGH (ref 8–23)
CO2: 26 mmol/L (ref 22–32)
Calcium: 8.9 mg/dL (ref 8.9–10.3)
Chloride: 99 mmol/L (ref 98–111)
Creatinine, Ser: 1.55 mg/dL — ABNORMAL HIGH (ref 0.44–1.00)
GFR, Estimated: 37 mL/min — ABNORMAL LOW (ref >60)
Glucose, Bld: 175 mg/dL — ABNORMAL HIGH (ref 70–99)
Potassium: 4.4 mmol/L (ref 3.5–5.1)
Sodium: 134 mmol/L — ABNORMAL LOW (ref 135–145)

## 2024-09-19 LAB — GLUCOSE, CAPILLARY
Glucose-Capillary: 180 mg/dL — ABNORMAL HIGH (ref 70–99)
Glucose-Capillary: 178 mg/dL — ABNORMAL HIGH (ref 70–99)
Glucose-Capillary: 159 mg/dL — ABNORMAL HIGH (ref 70–99)

## 2024-09-19 MED ORDER — OXYCODONE-ACETAMINOPHEN 5-325 MG PO TABS: 1.0000 | ORAL_TABLET | ORAL | Status: DC | PRN

## 2024-09-19 MED ORDER — LOSARTAN POTASSIUM 25 MG PO TABS
25.0000 mg | ORAL_TABLET | Freq: Every day | ORAL | Status: DC
  Administered 2024-09-19 – 2024-09-20 (×2): 25 mg via ORAL
  Filled 2024-09-19 (×2): qty 1

## 2024-09-19 MED ORDER — MORPHINE SULFATE (PF) 4 MG/ML IV SOLN
4.0000 mg | Freq: Once | INTRAVENOUS | Status: AC
  Administered 2024-09-19: 4 mg via INTRAVENOUS
  Filled 2024-09-19: qty 1

## 2024-09-19 NOTE — TOC Transition Note (Signed)
 Transition of Care [facility]) - Discharge Note   Patient Details  Name: Maureen Jackson MRN: [id number] Date of Birth: 04-Feb-1956  Transition of Care [facility]) CM/SW Contact:  Lucie Lunger, LCSWA Phone Number: 09/19/2024, 3:35 PM   Clinical Narrative:    CSW updated that pt will likely D/C home today. CSW updated Selinda with [facility] of plan for D/C. CSW requested that MD place [facility] PT orders when able prior to hospital D/C. Bariatric [facility] referral sent to [facility], they will deliver to the home. TOC signing off.   Final next level of care: Home w Home Health Services Barriers to Discharge: Barriers Resolved   Patient Goals and CMS Choice Patient states their goals for this hospitalization and ongoing recovery are:: return home CMS Medicare.gov Compare Post Acute Care list provided to:: Patient Choice offered to / list presented to : Patient      Discharge Placement                       Discharge Plan and Services Additional resources added to the After Visit Summary for   In-house Referral: Clinical Social Work   Post Acute Care Choice: Durable Medical Equipment                    HH Arranged: PT HH Agency: [facility] Date HH Agency Contacted: 09/19/24 Time HH Agency Contacted: 0930 Representative spoke with at [facility] Agency: Selinda  Social Drivers of Health (SDOH) Interventions SDOH Screenings   Food Insecurity: No Food Insecurity (09/15/2024)  Housing: Low Risk  (09/15/2024)  Transportation Needs: Unmet Transportation Needs (09/15/2024)  Utilities: Not At Risk (09/15/2024)  Alcohol Screen: Low Risk  (12/22/2022)  Depression (PHQ2-9): Low Risk  (01/01/2020)  Financial Resource Strain: Low Risk  (05/19/2024)  Social Connections: Unknown (09/15/2024)  Tobacco Use: Medium Risk (09/15/2024)     Readmission Risk Interventions    09/19/2024    3:34 PM 09/15/2024   11:39 AM 05/22/2024    2:03 PM  Readmission Risk Prevention Plan  Transportation Screening  Complete Complete Complete  Home Care Screening   Complete  Medication Review (RN CM)   Complete  HRI or Home Care Consult Complete Complete   Social Work Consult for Recovery Care Planning/Counseling Complete Complete   Palliative Care Screening Not Applicable Not Applicable   Medication Review Oceanographer) Complete Complete

## 2024-09-19 NOTE — Progress Notes (Signed)
 Progress Note  Patient Name: Maureen Jackson Date of Encounter: 09/19/2024  Primary Cardiologist: Alvan Carrier, MD  Subjective   Still SOB. Volume optimized based on echo.  Inpatient Medications    Scheduled Meds:  apixaban   5 mg Oral BID   atorvastatin   80 mg Oral Daily   brimonidine   1 drop Both Eyes BID   And   timolol   1 drop Both Eyes BID   calcium  carbonate  4 tablet Oral BID WC   erythromycin   Both Eyes Once   insulin  aspart  0-15 Units Subcutaneous TID WC   insulin  aspart  0-5 Units Subcutaneous QHS   insulin  glargine  15 Units Subcutaneous QHS   levothyroxine   200 mcg Oral QAC breakfast   levothyroxine   75 mcg Oral QAC breakfast   losartan   25 mg Oral Daily   metoprolol  succinate  25 mg Oral Daily   ofloxacin  1 drop Both Eyes QID   Continuous Infusions:  PRN Meds: acetaminophen  **OR** acetaminophen , artificial tears, hydrOXYzine, prochlorperazine    Vital Signs    Vitals:   09/18/24 1939 09/19/24 0300 09/19/24 0500 09/19/24 0800  BP: 117/65 118/67  130/72  Pulse: 66 73  70  Resp: 18 18  16   Temp: 98.1 F (36.7 C) 97.8 F (36.6 C)  98.2 F (36.8 C)  TempSrc: Oral Temporal  Oral  SpO2: 95% 95%  95%  Weight:   (!) 151.5 kg   Height:        Intake/Output Summary (Last 24 hours) at 09/19/2024 1036 Last data filed at 09/19/2024 0500 Gross per 24 hour  Intake 1080 ml  Output --  Net 1080 ml   Filed Weights   09/17/24 0412 09/18/24 0320 09/19/24 0500  Weight: (!) 149.4 kg (!) 150.6 kg (!) 151.5 kg    Telemetry     Personally reviewed.  Not on telemetry.  ECG    Not performed today.  Physical Exam   GEN: No acute distress.   Neck: Unable to examine due to body habitus. Cardiac: RRR, no murmur, rub, or gallop.  Respiratory: Nonlabored. Clear to auscultation bilaterally. GI: Soft, nontender, bowel sounds present. MS: No edema; No deformity. Neuro:  Nonfocal. Psych: Alert and oriented x 3. Normal affect.  Labs     Chemistry Recent Labs  Lab 09/15/24 0741 09/16/24 0529 09/17/24 0616 09/18/24 0548 09/19/24 0454  NA 143   < > 140 137 134*  K 4.3   < > 3.8 4.1 4.4  CL 108   < > 103 100 99  CO2 25   < > 24 27 26   GLUCOSE 118*   < > 185* 159* 175*  BUN 29*   < > 44* 52* 56*  CREATININE 1.19*   < > 1.64* 1.77* 1.55*  CALCIUM  8.0*   < > 8.5* 8.6* 8.9  PROT 7.2  --   --   --   --   ALBUMIN  3.4*  --   --   --   --   AST 28  --   --   --   --   ALT 25  --   --   --   --   ALKPHOS 295*  --   --   --   --   BILITOT 0.7  --   --   --   --   GFRNONAA 50*   < > 34* 31* 37*  ANIONGAP 10   < > 13 10 9    < > =  values in this interval not displayed.     Hematology Recent Labs  Lab 09/15/24 0528 09/16/24 0529 09/17/24 0616  WBC 7.4 8.6 8.3  RBC 4.16 3.84* 4.01  HGB 10.1* 9.5* 9.7*  HCT 35.1* 31.9* 33.5*  MCV 84.4 83.1 83.5  MCH 24.3* 24.7* 24.2*  MCHC 28.8* 29.8* 29.0*  RDW 15.5 15.6* 15.9*  PLT 202 290 284    Cardiac EnzymesNo results for input(s): TROPONINIHS in the last 720 hours.  BNP Recent Labs  Lab 09/14/24 1630  PROBNP 1,543.0*     DDimerNo results for input(s): DDIMER in the last 168 hours.   Radiology    No results found.  Assessment & Plan    Acute on chronic systolic and diastolic heart failure exacerbation CKD Hypothyroidism  - Presented with DOE, chills and myalgias for couple of days prior to presentation.  BNP mildly elevated, 1543.  Chest x-ray showed pulmonary vascular congestion and moderate cardiomegaly.  She was started on IV diuresis but had to be discontinued due to AKI.  Serum creatinine elevated to 1.7 this a.m. gradually trending up and today improve fto 1.5 off GDMT.  Continue to hold diuretics.  Echo this admission showed LVEF 30 to 35%, low normal RV function and CVP 3 mmHg. She is still SOB but I do not think she is volume overloaded.  - Her serum creatinine has been fluctuating for the last 1 year.  No definite baseline has been established  yet. - Continue metoprolol  succinate 25 mg once daily. - Switch Entresto  to Losartan  25 mg once daily. - She is on Jardiance  10 mg as well as 25 mg at home.  Need to stop Jardiance  10 mg upon discharge. - Lactic acid within normal limits. - TSH 11, 135 around 6 months ago. Hypothyroidism per primary team.  [facility] will sign off.   Medication Recommendations:  Continue current medications Other recommendations (labs, testing, etc):  None Follow up as an outpatient:  Keep appt with [facility] in Oct 2025   30 minutes spent in reviewing prior records, specialist notes, more than 3 labs, discussion of the above problems with the patient and documentation.  Signed, Diannah SHAUNNA Maywood, MD  09/19/2024, 10:36 AM

## 2024-09-19 NOTE — Progress Notes (Signed)
 Physical Therapy Treatment Patient Details Name: Maureen Jackson MRN: [id number] DOB: [birth year] Today's Date: 09/19/2024   History of Present Illness Maureen Jackson is a 68 y.o. female with medical history significant of hypertension, T2DM, CAD s/p DES 2018, glaucoma, chronic combined HFrEF and HFpEF, stage IIIa CKD, morbid obesity (BMI 56.6), and OSA who presented to the emergency department from home via EMS due to 2-day onset of shortness of breath which worsens with exertion.  She complained of worsening shortness of breath on laying flat in bed.  She also complained of left eye pain and that she was diagnosed with blood clot behind left eye recently about 2 weeks ago and was scheduled for a follow-up with the retinologist in 3 days to discuss surgical intervention.  She complained of redness of left eye with some pain which is new from last visit to ophthalmology.    PT Comments  Pt and RN report pt not feeling as good as she has in the last couple days, monitored through session.  Only reports of pain in Lt eye that did not effect any of session.  Pt able to complete transfer training with no assistance required, just cueing for hand positions to assist with controlled movements.  Ambulated with RW, slow labored movements limited mainly by fatigue, no LOB episodes present, did report some lower back pain towards the end of gait training that was resolved upon sitting back down.  Pt left in chair with call bell within reach and chair alarm set.  RN aware of status.      If plan is discharge home, recommend the following:     Can travel by private vehicle        Equipment Recommendations       Recommendations for Other Services       Precautions / Restrictions Precautions Precautions: Fall Recall of Precautions/Restrictions: Intact Restrictions Weight Bearing Restrictions Per Provider Order: No     Mobility  Bed Mobility               General bed mobility comments: Pt  sitting in bathroom with RN at entrance    Transfers Overall transfer level: Modified independent Equipment used: Rolling walker (2 wheels) Transfers: Sit to/from Stand Sit to Stand: Contact guard assist           General transfer comment: Cueing for hand placement, able to complete independently with SBA    Ambulation/Gait Ambulation/Gait assistance: Supervision Gait Distance (Feet): 55 Feet Assistive device: Rolling walker (2 wheels) Gait Pattern/deviations: Decreased step length - right, Decreased step length - left, Decreased stride length, Trunk flexed Gait velocity: decreased     General Gait Details: slightly labored slow movement without loss of balance using RW and limited mostly due to c/o fatigue   Stairs             Wheelchair Mobility     Tilt Bed    Modified Rankin (Stroke Patients Only)       Balance                                            Communication Communication Communication: No apparent difficulties  Cognition Arousal: Alert     PT - Cognitive impairments: No apparent impairments  Following commands: Intact      Cueing Cueing Techniques: Verbal cues, Tactile cues  Exercises      General Comments        Pertinent Vitals/Pain Pain Assessment Pain Assessment: 0-10 Pain Score: 4  Pain Location: Lt eye Pain Descriptors / Indicators: Discomfort Pain Intervention(s): Monitored during session    Home Living                          Prior Function            PT Goals (current goals can now be found in the care plan section)      Frequency           PT Plan      Co-evaluation              AM-PAC PT 6 Clicks Mobility   Outcome Measure  Help needed turning from your back to your side while in a flat bed without using bedrails?: None Help needed moving from lying on your back to sitting on the side of a flat bed without using  bedrails?: None Help needed moving to and from a bed to a chair (including a wheelchair)?: A Little Help needed standing up from a chair using your arms (e.g., wheelchair or bedside chair)?: A Little Help needed to walk in hospital room?: A Little Help needed climbing 3-5 steps with a railing? : A Lot 6 Click Score: 19    End of Session Equipment Utilized During Treatment: Gait belt Activity Tolerance: Patient tolerated treatment well;Patient limited by fatigue Patient left: in chair;with call bell/phone within reach Nurse Communication: Mobility status PT Visit Diagnosis: Unsteadiness on feet (R26.81);Other abnormalities of gait and mobility (R26.89);Muscle weakness (generalized) (M62.81)     Time: 8451-8393 PT Time Calculation (min) (ACUTE ONLY): 18 min  Charges:    $Therapeutic Activity: 8-22 mins PT General Charges $$ ACUTE PT VISIT: 1 Visit                     Augustin Mclean, LPTA/CLT; CBIS [phone]  Mclean Augustin Amble 09/19/2024, 4:22 PM

## 2024-09-19 NOTE — Plan of Care (Signed)
  Problem: Education: Goal: Knowledge of General Education information will improve Description Including pain rating scale, medication(s)/side effects and non-pharmacologic comfort measures Outcome: Progressing   Problem: Activity: Goal: Risk for activity intolerance will decrease Outcome: Progressing   Problem: Nutrition: Goal: Adequate nutrition will be maintained Outcome: Progressing   Problem: Elimination: Goal: Will not experience complications related to urinary retention Outcome: Progressing   Problem: Safety: Goal: Ability to remain free from injury will improve Outcome: Progressing   Problem: Skin Integrity: Goal: Risk for impaired skin integrity will decrease Outcome: Progressing

## 2024-09-19 NOTE — Care Management Important Message (Signed)
 Important Message  Patient Details  Name: DESMA WILKOWSKI MRN: [id number] Date of Birth: [birth year]   Important Message Given:  Yes - Medicare IM     Mccade Sullenberger L Yamin Swingler 09/19/2024, 4:18 PM

## 2024-09-19 NOTE — Progress Notes (Signed)
 [facility] PROGRESS NOTE  Maureen Jackson (DOB: [birth year]) FMW:[id number] PCP: Orpha Yancey LABOR, MD  Brief Narrative: Maureen Jackson is a 68 y.o. female with a history of CAD s/p DES 2018, HTN, T2DM, chronic combined HFrEF, stage IIIa CKD, morbid obesity, OSA, and glaucoma who presented to the ED on 09/14/2024 with exertional dyspnea worsening over 2 days with orthopnea. Workup in the ED shows proBNP 1543, chest x-ray shows moderate cardiomegaly with findings of interstitial edema.  Patient was admitted for further evaluation and started on IV diuretics.  Ophthalmologist on-call Dr. Valdemar was consulted regarding patient's complaints of left eye pain and recommended patient can be admitted and recommended erythromycin and ofloxacin eyedrops. She improved but suffered AKI.  Subjective: Feels terrible, left eye pain is worse  Objective: BP 116/66 (BP Location: Right Arm)   Pulse 74   Temp 97.9 F (36.6 C) (Oral)   Resp 18   Ht 5' 5 (1.651 m)   Wt (!) 151.5 kg   SpO2 97%   BMI 55.58 kg/m   Gen: Apparent discomfort, but not ill Eyes: Left eye with injected conjunctivae, no papillae, no anterior chamber findings, PERRL Pulm: Clear, nonlabored  CV: RRR, no MRG, trace edema GI: Soft, NT, ND, +BS  Neuro: Alert and oriented. No new focal deficits. Ext: Warm, no deformities. Skin: No new rashes, lesions or ulcers on visualized skin   Assessment & Plan: Principal Problem:   Acute on chronic combined systolic and diastolic CHF (congestive heart failure) (HCC) Active Problems:   Acquired hypothyroidism   Morbid obesity/BMI > 55   Elevated troponin   Coronary artery disease involving native coronary artery of native heart without angina pectoris   Mixed hyperlipidemia   Acute bacterial conjunctivitis of left eye   Prolonged QT interval   Chronic kidney disease, stage 3b (HCC)   Type 2 diabetes mellitus with hyperglycemia (HCC)   History of DVT (deep vein thrombosis)   AKI (acute  kidney injury)  Acute on chronic combined systolic and diastolic CHF: Patient presented with progressive shortness of breath, orthopnea, peripheral edema.  proBNP 1,543. Chest x-ray showed moderate cardiomegaly with findings of interstitial edema. Echo 10/6: LVEF 30-35%, mild LVH, G1DD, low-normal RVSF, small pericardial effusion (hx pericardial window), unchanged LVSF from prior.  - Now appears euvolemic, had to back off diuresis with AKI.  - Have to hold further diuresis, entresto , jardiance . Will restart losartan  in place of entresto  today per cardiology discussion.  - oxycodone  prn added for now   Corneal abrasion (suspected), acute bacterial conjunctivitis of left eye on chronic glaucoma: Pt reports normal tonometry during this episode. Also noted the discomfort she felt resolved with the numbing for that procedure. Discussed with Dr. Austin, ophtho, due to patient's worsening pain today. Still no red flag symptoms, suspicion is that an abrasion was healing and the healing membrane became dislodged overnight. Continue to protect eye, no indication for urgent evaluation at all.  - Continue combigan  - Continue topical ofloxacin per ophtho, Dr. Abram, recommendation.    Elevated troponin possibly secondary to type II demand ischemia: Troponin 44 >> 39.  Patient denies chest pain.     Prolonged QT interval: QTc 519 Avoid QT prolonging drugs Magnesium  normal.    Morbid obesity (BMI 54.88) Diet and lifestyle modification Patient may need to follow-up with outpatient PCP for weight loss program. - Pt at high risk of deconditioning, reports significant weakness diffusely. PT/OT consulted.    CAD with prior DES to LAD and RCA  2018/PAD: Continue home medications.  Resumed Toprol  XL.   CKD stage IIIb now with AKI: Due to hypotension, impaired renal autoregulation, decreased EABV from diuresis.  - Recheck BMP in AM, still making good UOP. Consider further work up if holding BP meds and diuretic  don't help.    Acquired hypothyroidism: Remote past history of thyroidectomy for benign goiter. Continue levothyroxine  at high dose 250mcg daily. TSH slightly elevated, will discuss with patient and likely increase dose by 10%.    Mixed hyperlipidemia: Continue Lipitor .   Type 2 diabetes mellitus with hyperglycemia: Most recent A1c on 05/17/2024 was 7.4% - Continue SSI, glargine - Hold SGLT2i as above   Essential hypertension: Continue Entresto , amlodipine , Monitor carefully on IV Lasix  for diuresis   History of DVT: Continue Eliquis .    Maureen KATHEE Come, MD [facility] [email or web address] 09/19/2024, 5:13 PM

## 2024-09-20 DIAGNOSIS — I5043 Acute on chronic combined systolic (congestive) and diastolic (congestive) heart failure: Secondary | ICD-10-CM | POA: Diagnosis not present

## 2024-09-20 LAB — GLUCOSE, CAPILLARY: Glucose-Capillary: 163 mg/dL — ABNORMAL HIGH (ref 70–99)

## 2024-09-20 MED ORDER — OFLOXACIN 0.3 % OP SOLN: 1.0000 [drp] | Freq: Four times a day (QID) | OPHTHALMIC | Status: DC

## 2024-09-20 MED ORDER — TRAMADOL HCL 50 MG PO TABS: 50.0000 mg | ORAL_TABLET | Freq: Four times a day (QID) | ORAL | 0 refills | Status: DC | PRN

## 2024-09-20 MED ORDER — LOSARTAN POTASSIUM 25 MG PO TABS: 25.0000 mg | ORAL_TABLET | Freq: Every day | ORAL | 0 refills | Status: DC

## 2024-09-20 MED ORDER — LEVOTHYROXINE SODIUM 50 MCG PO TABS: 75.0000 ug | ORAL_TABLET | Freq: Every day | ORAL | 0 refills | Status: AC

## 2024-09-20 NOTE — Discharge Summary (Signed)
 Physician Discharge Summary   Patient: Maureen Jackson MRN: [id number] DOB: 1956-03-28  Admit date:     09/14/2024  Discharge date: 09/20/24  Discharge Physician: Bernardino KATHEE Come   PCP: Orpha Yancey LABOR, MD   Recommendations at discharge:  Follow up with PCP in 1-2 weeks.  Continue management of hypothyroidism. TSH still elevated so increased synthroid  > 275mcg daily.  Follow up with ophthalmology if left eye pain/redness do not improve.  Follow up with cardiology as scheduled 10/28. Changed entresto  to losartan  25mg .  Discharge Diagnoses: Principal Problem:   Acute on chronic combined systolic and diastolic CHF (congestive heart failure) (HCC) Active Problems:   Acquired hypothyroidism   Morbid obesity/BMI > 55   Elevated troponin   Coronary artery disease involving native coronary artery of native heart without angina pectoris   Mixed hyperlipidemia   Acute bacterial conjunctivitis of left eye   Prolonged QT interval   Chronic kidney disease, stage 3b (HCC)   Type 2 diabetes mellitus with hyperglycemia (HCC)   History of DVT (deep vein thrombosis)   AKI (acute kidney injury)   Hospital Course: Maureen Jackson is a 68 y.o. female with a history of CAD s/p DES 2018, HTN, T2DM, chronic combined HFrEF, stage IIIa CKD, morbid obesity, OSA, and glaucoma who presented to the ED on 09/14/2024 with exertional dyspnea worsening over 2 days with orthopnea. Workup in the ED shows proBNP 1543, chest x-ray shows moderate cardiomegaly with findings of interstitial edema.  Patient was admitted for further evaluation and started on IV diuretics.  Ophthalmologist on-call Dr. Valdemar was consulted regarding patient's complaints of left eye pain and recommended patient can be admitted and recommended erythromycin and ofloxacin eyedrops. She improved but suffered AKI which improved when diuretic, entresto  were held.  Assessment and Plan: Acute on chronic combined systolic and diastolic CHF: Patient  presented with progressive shortness of breath, orthopnea, peripheral edema.  proBNP 1,543. Chest x-ray showed moderate cardiomegaly with findings of interstitial edema. Echo 10/6: LVEF 30-35%, mild LVH, G1DD, low-normal RVSF, small pericardial effusion (hx pericardial window), unchanged LVSF from prior.  - Now appears euvolemic, had to back off diuresis with AKI.  Continue metoprolol  succinate - Have to hold further diuresis, entresto , jardiance . Restarted losartan  in place of entresto .  - Ok to restart jardiance  at discharge (note duplicate 10mg  and 25mg , told pt to take 25mg  daily).     Corneal abrasion (suspected), acute bacterial conjunctivitis of left eye on chronic glaucoma: Pt reports normal tonometry during this episode. Also noted the discomfort she felt resolved with the numbing for that procedure. Discussed with Dr. Austin, ophtho, due to patient's worsening pain today. Still no red flag symptoms, suspicion is that an abrasion was healing and the healing membrane became dislodged overnight. Continue to protect eye, no indication for urgent evaluation at all.  - Continue combigan  - Continue topical ofloxacin per ophtho, Dr. Abram, recommendation. Follow up information provided in the event this does not continue improving.    Elevated troponin possibly secondary to type II demand ischemia: Troponin 44 >> 39.  Patient denies chest pain.     Prolonged QT interval: QTc 519 Avoid QT prolonging drugs Magnesium  normal.    Morbid obesity (BMI 54.88) Diet and lifestyle modification Patient may need to follow-up with outpatient PCP for weight loss program. - HHPT at discharge.   CAD with prior DES to LAD and RCA 2018/PAD: Continue home medications.  Resumed Toprol  XL.   CKD stage IIIb now with AKI:  Due to hypotension, impaired renal autoregulation, decreased EABV from diuresis.  - Continues to improve, suggest recheck at follow up.     Acquired hypothyroidism: Remote past history of  thyroidectomy for benign goiter. Continue levothyroxine  at high dose 250mcg daily. TSH slightly elevated, will discuss with patient and likely increase dose by 10%.    Mixed hyperlipidemia: Continue Lipitor .   Type 2 diabetes mellitus with hyperglycemia: Most recent A1c on 05/17/2024 was 7.4% - Continue home Tx   Essential hypertension: Meds as above   History of DVT: Continue Eliquis .  Consultants: Cardiology, ophthalmology Procedures performed: None  Disposition: Home Diet recommendation:  Cardiac diet DISCHARGE MEDICATION: Allergies as of 09/20/2024       Reactions   Ampicillin  Other (See Comments)   Allergic, per [facility]   Aspirin  Nausea Only   Hydrocodone  Other (See Comments)   Allergic, per [facility]   Unasyn  [ampicillin -sulbactam Sodium ] Rash, Other (See Comments)   Allergic, per [facility]        Medication List     STOP taking these medications    metoprolol  tartrate 50 MG tablet Commonly known as: LOPRESSOR    sacubitril -valsartan  97-103 MG Commonly known as: Entresto        TAKE these medications    acetaminophen  325 MG tablet Commonly known as: TYLENOL  Take 2 tablets (650 mg total) by mouth every 4 (four) hours as needed for mild pain, fever or headache.   apixaban  5 MG Tabs tablet Commonly known as: ELIQUIS  Take 1 tablet (5 mg total) by mouth 2 (two) times daily.   atorvastatin  80 MG tablet Commonly known as: LIPITOR  TAKE 1 TABLET(80 MG) BY MOUTH DAILY   calcium  carbonate 500 MG chewable tablet Commonly known as: TUMS - dosed in mg elemental calcium  Chew 4 tablets by mouth 2 (two) times daily.   cetirizine  10 MG tablet Commonly known as: ZYRTEC  Take 10 mg by mouth daily as needed for allergies.   Combigan  0.2-0.5 % ophthalmic solution Generic drug: brimonidine -timolol  Place 1 drop into both eyes 2 (two) times daily.   furosemide  40 MG tablet Commonly known as: LASIX  Take 1.5 tablets (60 mg total) by mouth daily.   Jardiance  25 MG Tabs  tablet Generic drug: empagliflozin  Take 25 mg by mouth daily. What changed: Another medication with the same name was removed. Continue taking this medication, and follow the directions you see here.   levothyroxine  200 MCG tablet Commonly known as: SYNTHROID  Take 200 mcg by mouth daily before breakfast. Take 1 tablet with 50mcg for a total dose of 250mcg daily What changed: Another medication with the same name was changed. Make sure you understand how and when to take each.   levothyroxine  50 MCG tablet Commonly known as: SYNTHROID  Take 1.5 tablets (75 mcg total) by mouth daily before breakfast. Take 1 tablet with 200mcg for a total dose of 275mcg daily What changed:  how much to take additional instructions   losartan  25 MG tablet Commonly known as: COZAAR  Take 1 tablet (25 mg total) by mouth daily.   metoprolol  succinate 25 MG 24 hr tablet Commonly known as: TOPROL -XL TAKE 1 TABLET(25 MG) BY MOUTH DAILY   NovoLOG  FlexPen 100 UNIT/ML FlexPen Generic drug: insulin  aspart SMARTSIG:20 Unit(s) SUB-Q 3 Times Daily   ofloxacin 0.3 % ophthalmic solution Commonly known as: OCUFLOX Place 1 drop into both eyes 4 (four) times daily.   Omron 3 Series BP Monitor Devi Use as directed   OneTouch Verio test strip Generic drug: glucose blood Test glucose 4 times  day.   Ozempic (0.25 or 0.5 MG/DOSE) 2 MG/3ML Sopn Generic drug: Semaglutide(0.25 or 0.5MG /DOS) Inject 0.5 mg into the skin once a week. Fridays   senna-docusate 8.6-50 MG tablet Commonly known as: Senokot-S Take 2 tablets by mouth 2 (two) times daily. What changed:  when to take this reasons to take this   traMADol  50 MG tablet Commonly known as: Ultram  Take 1 tablet (50 mg total) by mouth every 6 (six) hours as needed for moderate pain (pain score 4-6) or severe pain (pain score 7-10).   Ventolin  HFA 108 (90 Base) MCG/ACT inhaler Generic drug: albuterol  Inhale 1-2 puffs into the lungs every 6 (six) hours as needed  for wheezing or shortness of breath.        Follow-up Information     [facility]. Go on 10/07/2024.   Specialty: Cardiology Why: Hospital Follow-Up 10/07/2024 Please bring all mediations to follow-up appointment [facility], Entrance C of of [address] [place] Parking at the [employer] information: [address] [place]  [phone]        [facility] Follow up.   Why: Bed side commode will be delivered Contact information: [address] [address] [phone]         [facility], [facility]  Follow up.   Why: PT will call to schedule your first home visit. Contact information: [address] [place] [address] [phone]         Orpha Yancey LABOR, MD Follow up.   Specialty: Internal Medicine Contact information: [address] [place] [address] [phone]         Austin Olam CROME, MD. Schedule an appointment as soon as possible for a visit on 09/22/2024.   Specialty: Ophthalmology Contact information: [address] [place] [address] [phone]                Discharge Exam: Fredricka Weights   09/18/24 0320 09/19/24 0500 09/20/24 0411  Weight: (!) 150.6 kg (!) 151.5 kg (!) 151.7 kg  BP (!) 119/59 (BP Location: Right Arm)   Pulse 70   Temp 97.7 F (36.5 C) (Oral)   Resp 18   Ht 5' 5 (1.651 m)   Wt (!) 151.7 kg   SpO2 95%   BMI 55.65 kg/m   No distress, obese pleasant female Clear, nonlabored RRR, no MRG, minimal edema dependently +BS, soft, NT Left eye with conjunctival injection without papillae upper eyelid more involved than lower. Cornea clear. No exudate. PERRL  Condition at discharge: stable  The results of significant diagnostics from this hospitalization (including imaging, microbiology, ancillary and laboratory) are listed below for reference.   Imaging  Studies: ECHOCARDIOGRAM COMPLETE Result Date: 09/15/2024    ECHOCARDIOGRAM REPORT   Patient Name:   RAELEA GOSSE Date of Exam: 09/15/2024 Medical Rec #:  [id number]       Height:       65.0 in Accession #:    [id number]      Weight:       329.8 lb Date of Birth:  [birth year]        BSA:          2.447 m Patient Age:    68 years        BP:           154/74 mmHg Patient Gender: F  HR:           61 bpm. Exam Location:  [facility] Procedure: 2D Echo, Cardiac Doppler, Color Doppler and Intracardiac            Opacification Agent (Both Spectral and Color Flow Doppler were            utilized during procedure). Indications:    CHF-Acute Systolic I50.21  History:        Patient has prior history of Echocardiogram examinations, most                 recent 05/17/2024. CHF and Cardiomyopathy, CAD, TIA; Risk                 Factors:Hypertension, Diabetes, Dyslipidemia, Sleep Apnea and                 Obesity.  Sonographer:    Aida Pizza RCS Referring Phys: [id number] OLADAPO ADEFESO  Sonographer Comments: Technically difficult study due to poor echo windows. Image acquisition challenging due to patient body habitus. IMPRESSIONS  1. Diffuse hypokinesis, worse in the inferior, inferoseptal walls . Left ventricular ejection fraction, by estimation, is 30 to 35%. The left ventricle has moderately decreased function. There is mild concentric left ventricular hypertrophy. Left ventricular diastolic parameters are consistent with Grade I diastolic dysfunction (impaired relaxation).  2. Right ventricular systolic function is low normal. The right ventricular size is normal.  3. Small pericardial effusion posterior to LV . a small pericardial effusion is present.  4. The mitral valve is normal in structure. Trivial mitral valve regurgitation.  5. The aortic valve is tricuspid. Aortic valve regurgitation is not visualized. Aortic valve sclerosis is present, with no evidence of aortic valve stenosis.  6. The inferior vena cava  is normal in size with greater than 50% respiratory variability, suggesting right atrial pressure of 3 mmHg. Comparison(s): The left ventricular function is unchanged. FINDINGS  Left Ventricle: Diffuse hypokinesis, worse in the inferior, inferoseptal walls. Left ventricular ejection fraction, by estimation, is 30 to 35%. The left ventricle has moderately decreased function. Definity  contrast agent was given IV to delineate the left ventricular endocardial borders. The left ventricular internal cavity size was normal in size. There is mild concentric left ventricular hypertrophy. Left ventricular diastolic parameters are consistent with Grade I diastolic dysfunction (impaired relaxation). Right Ventricle: The right ventricular size is normal. Right vetricular wall thickness was not assessed. Right ventricular systolic function is low normal. Left Atrium: Left atrial size was normal in size. Right Atrium: Right atrial size was normal in size. Pericardium: Small pericardial effusion posterior to LV. A small pericardial effusion is present. Mitral Valve: The mitral valve is normal in structure. Trivial mitral valve regurgitation. Tricuspid Valve: The tricuspid valve is normal in structure. Tricuspid valve regurgitation is trivial. Aortic Valve: The aortic valve is tricuspid. Aortic valve regurgitation is not visualized. Aortic valve sclerosis is present, with no evidence of aortic valve stenosis. Pulmonic Valve: The pulmonic valve was grossly normal. Pulmonic valve regurgitation is not visualized. Aorta: The aortic root is normal in size and structure. Venous: The inferior vena cava is normal in size with greater than 50% respiratory variability, suggesting right atrial pressure of 3 mmHg. IAS/Shunts: No atrial level shunt detected by color flow Doppler.  LEFT VENTRICLE PLAX 2D LVIDd:         6.00 cm   Diastology LVIDs:         4.80 cm   LV e' medial:    3.73  cm/s LV PW:         1.30 cm   LV E/e' medial:  17.2 LV IVS:         1.40 cm   LV e' lateral:   5.35 cm/s LVOT diam:     2.00 cm   LV E/e' lateral: 12.0 LV SV:         55 LV SV Index:   22 LVOT Area:     3.14 cm  RIGHT VENTRICLE RV S prime:     9.32 cm/s TAPSE (M-mode): 2.1 cm LEFT ATRIUM             Index        RIGHT ATRIUM           Index LA diam:        4.00 cm 1.63 cm/m   RA Area:     19.90 cm LA Vol (A2C):   73.0 ml 29.84 ml/m  RA Volume:   58.30 ml  23.83 ml/m LA Vol (A4C):   69.2 ml 28.28 ml/m LA Biplane Vol: 75.3 ml 30.78 ml/m  AORTIC VALVE LVOT Vmax:   84.40 cm/s LVOT Vmean:  57.700 cm/s LVOT VTI:    0.174 m  AORTA Ao Root diam: 3.50 cm MITRAL VALVE MV Area (PHT): 2.56 cm    SHUNTS MV Decel Time: 296 msec    Systemic VTI:  0.17 m MV E velocity: 64.00 cm/s  Systemic Diam: 2.00 cm MV A velocity: 95.20 cm/s MV E/A ratio:  0.67 Vina Gull MD Electronically signed by Vina Gull MD Signature Date/Time: 09/15/2024/4:53:36 PM    Final    DG Chest 2 View Result Date: 09/14/2024 CLINICAL DATA:  SOB EXAM: CHEST - 2 VIEW COMPARISON:  06/05/2024 FINDINGS: Mild diffuse interstitial opacities throughout both lungs. No focal airspace consolidation, pleural effusion, or pneumothorax. Moderate cardiomegaly. No acute fracture or destructive lesions. Multilevel thoracic osteophytosis. Surgical clips in the lower neck. IMPRESSION: Moderate cardiomegaly with findings of interstitial edema. Electronically Signed   By: Rogelia Myers M.D.   On: 09/14/2024 16:58    Microbiology: Results for orders placed or performed during the hospital encounter of 06/05/24  Resp panel by RT-PCR (RSV, Flu A&B, Covid) Anterior Nasal Swab     Status: None   Collection Time: 06/05/24  4:34 PM   Specimen: Anterior Nasal Swab  Result Value Ref Range Status   SARS Coronavirus 2 by RT PCR NEGATIVE NEGATIVE Final    Comment: (NOTE) SARS-CoV-2 target nucleic acids are NOT DETECTED.  The SARS-CoV-2 RNA is generally detectable in upper respiratory specimens during the acute phase of infection.  The lowest concentration of SARS-CoV-2 viral copies this assay can detect is 138 copies/mL. A negative result does not preclude SARS-Cov-2 infection and should not be used as the sole basis for treatment or other patient management decisions. A negative result may occur with  improper specimen collection/handling, submission of specimen other than nasopharyngeal swab, presence of viral mutation(s) within the areas targeted by this assay, and inadequate number of viral copies(<138 copies/mL). A negative result must be combined with clinical observations, patient history, and epidemiological information. The expected result is Negative.  Fact Sheet for Patients:  BloggerCourse.com  Fact Sheet for Healthcare Providers:  SeriousBroker.it  This test is no t yet approved or cleared by the [place]  FDA and  has been authorized for detection and/or diagnosis of SARS-CoV-2 by FDA under an Emergency Use Authorization (EUA). This EUA will remain  in effect (meaning this  test can be used) for the duration of the COVID-19 declaration under Section 564(b)(1) of the Act, 21 U.S.C.section 360bbb-3(b)(1), unless the authorization is terminated  or revoked sooner.       Influenza A by PCR NEGATIVE NEGATIVE Final   Influenza B by PCR NEGATIVE NEGATIVE Final    Comment: (NOTE) The Xpert Xpress SARS-CoV-2/FLU/RSV plus assay is intended as an aid in the diagnosis of influenza from Nasopharyngeal swab specimens and should not be used as a sole basis for treatment. Nasal washings and aspirates are unacceptable for Xpert Xpress SARS-CoV-2/FLU/RSV testing.  Fact Sheet for Patients: BloggerCourse.com  Fact Sheet for Healthcare Providers: SeriousBroker.it  This test is not yet approved or cleared by the [place]  FDA and has been authorized for detection and/or diagnosis of SARS-CoV-2 by FDA  under an Emergency Use Authorization (EUA). This EUA will remain in effect (meaning this test can be used) for the duration of the COVID-19 declaration under Section 564(b)(1) of the Act, 21 U.S.C. section 360bbb-3(b)(1), unless the authorization is terminated or revoked.     Resp Syncytial Virus by PCR NEGATIVE NEGATIVE Final    Comment: (NOTE) Fact Sheet for Patients: BloggerCourse.com  Fact Sheet for Healthcare Providers: SeriousBroker.it  This test is not yet approved or cleared by the [place]  FDA and has been authorized for detection and/or diagnosis of SARS-CoV-2 by FDA under an Emergency Use Authorization (EUA). This EUA will remain in effect (meaning this test can be used) for the duration of the COVID-19 declaration under Section 564(b)(1) of the Act, 21 U.S.C. section 360bbb-3(b)(1), unless the authorization is terminated or revoked.  Performed at [facility], [address], [place], [place] [address]     Labs: CBC: Recent Labs  Lab 09/14/24 1630 09/15/24 0528 09/16/24 0529 09/17/24 0616  WBC 6.6 7.4 8.6 8.3  HGB 9.4* 10.1* 9.5* 9.7*  HCT 33.3* 35.1* 31.9* 33.5*  MCV 84.7 84.4 83.1 83.5  PLT 270 202 290 284   Basic Metabolic Panel: Recent Labs  Lab 09/15/24 0741 09/16/24 0529 09/17/24 0616 09/18/24 0548 09/19/24 0454  NA 143 138 140 137 134*  K 4.3 4.0 3.8 4.1 4.4  CL 108 102 103 100 99  CO2 25 25 24 27 26   GLUCOSE 118* 172* 185* 159* 175*  BUN 29* 37* 44* 52* 56*  CREATININE 1.19* 1.46* 1.64* 1.77* 1.55*  CALCIUM  8.0* 8.1* 8.5* 8.6* 8.9  MG 2.5* 2.5*  --   --   --   PHOS 4.2 4.0  --   --   --    Liver Function Tests: Recent Labs  Lab 09/15/24 0741  AST 28  ALT 25  ALKPHOS 295*  BILITOT 0.7  PROT 7.2  ALBUMIN  3.4*   CBG: Recent Labs  Lab 09/18/24 2032 09/19/24 0739 09/19/24 1146 09/19/24 1642 09/20/24 0737  GLUCAP 173* 180* 178* 159* 163*    Discharge time spent: greater  than 30 minutes.  Signed: Bernardino KATHEE Come, MD [facility] 09/21/2024

## 2024-09-20 NOTE — Plan of Care (Signed)

## 2024-09-22 ENCOUNTER — Telehealth: Payer: Self-pay

## 2024-09-22 NOTE — Transitions of Care (Post Inpatient/ED Visit) (Signed)
   09/22/2024  Name: Maureen Jackson MRN: [id number] DOB: 03-May-1956  Today's TOC FU Call Status: Today's TOC FU Call Status:: Unsuccessful Call (1st Attempt) Unsuccessful Call (1st Attempt) Date: 09/22/24  Attempted to reach the patient regarding the most recent Inpatient/ED visit.  Follow Up Plan: Additional outreach attempts will be made to reach the patient to complete the Transitions of Care (Post Inpatient/ED visit) call.   Hulda Reddix J. Gabriela Irigoyen RN, MSN [facility], [facility] RN Care Manager Direct Dial: [phone]  Fax: [phone] Website: [email or web address]

## 2024-09-23 ENCOUNTER — Telehealth: Payer: Self-pay

## 2024-09-23 NOTE — Transitions of Care (Post Inpatient/ED Visit) (Signed)
   09/23/2024  Name: Maureen Jackson MRN: [id number] DOB: 11/17/1956  Today's TOC FU Call Status: Today's TOC FU Call Status:: Unsuccessful Call (2nd Attempt) Unsuccessful Call (2nd Attempt) Date: 09/23/24  Attempted to reach the patient regarding the most recent Inpatient/ED visit.  Follow Up Plan: Additional outreach attempts will be made to reach the patient to complete the Transitions of Care (Post Inpatient/ED visit) call.   Gaby Harney J. Waylon Koffler RN, MSN [facility], [facility] RN Care Manager Direct Dial: [phone]  Fax: [phone] Website: [email or web address]

## 2024-09-24 ENCOUNTER — Telehealth: Payer: Self-pay

## 2024-09-24 NOTE — Transitions of Care (Post Inpatient/ED Visit) (Signed)
   09/24/2024  Name: Maureen Jackson MRN: [id number] DOB: October 19, 1956  Today's TOC FU Call Status: Today's TOC FU Call Status:: Unsuccessful Call (3rd Attempt) Unsuccessful Call (3rd Attempt) Date: 09/24/24  Attempted to reach the patient regarding the most recent Inpatient/ED visit.  Follow Up Plan: No further outreach attempts will be made at this time. We have been unable to contact the patient.  Jeoffrey Eleazer J. Aadi Bordner RN, MSN [facility], [facility] RN Care Manager Direct Dial: [phone]  Fax: [phone] Website: [email or web address]

## 2024-09-24 NOTE — Progress Notes (Signed)
 Heart Failure Navigator Progress Note  Maureen Jackson with [facility] is assigned to this patient. Maureen reached out to the me today about this patient recent discharge on 09/20/24 to make sure she was back at home.  I shared that she has an upcoming appointment with [facility] on 10/07/24 @ [facility]. Patient was provided with a scale from [facility].  Maureen will reinforce daily weights and other CHF education when she visits with her.  She stated that she has only seen her once prior to this hospitalization but had 6 attempts prior.  Shared that she is often not at home often due to travels with her aide to different stores and visiting friends.  She does not like taking her diuretics due to not being at home and sleeps till lunch time so she does not start taking them until about 4PM daily. She goes to bed about 1-2 AM because the diuretic keeps her up late.  Maureen to continue to try to check in on her if she is at home for check -ins.  Maureen Pines, RN, BSN [facility] Heart Failure Navigator Secure Chat Only

## 2024-10-02 DIAGNOSIS — H4089 Other specified glaucoma: Secondary | ICD-10-CM | POA: Diagnosis not present

## 2024-10-02 DIAGNOSIS — H4312 Vitreous hemorrhage, left eye: Secondary | ICD-10-CM | POA: Diagnosis not present

## 2024-10-02 DIAGNOSIS — H34832 Tributary (branch) retinal vein occlusion, left eye, with macular edema: Secondary | ICD-10-CM | POA: Diagnosis not present

## 2024-10-07 ENCOUNTER — Inpatient Hospital Stay (HOSPITAL_COMMUNITY): Admission: RE | Admit: 2024-10-07 | Source: Ambulatory Visit | Admitting: Cardiology

## 2024-10-07 DIAGNOSIS — H4089 Other specified glaucoma: Secondary | ICD-10-CM | POA: Diagnosis not present

## 2024-10-14 ENCOUNTER — Emergency Department (HOSPITAL_COMMUNITY)
Admission: EM | Admit: 2024-10-14 | Discharge: 2024-10-15 | Disposition: A | Discharge status: Home / Self Care | Attending: Emergency Medicine | Admitting: Emergency Medicine

## 2024-10-14 ENCOUNTER — Other Ambulatory Visit: Payer: Self-pay

## 2024-10-14 ENCOUNTER — Emergency Department (HOSPITAL_COMMUNITY)

## 2024-10-14 ENCOUNTER — Encounter (HOSPITAL_COMMUNITY): Payer: Self-pay

## 2024-10-14 DIAGNOSIS — R0602 Shortness of breath: Secondary | ICD-10-CM | POA: Insufficient documentation

## 2024-10-14 DIAGNOSIS — Z7901 Long term (current) use of anticoagulants: Secondary | ICD-10-CM | POA: Insufficient documentation

## 2024-10-14 DIAGNOSIS — I482 Chronic atrial fibrillation, unspecified: Secondary | ICD-10-CM | POA: Insufficient documentation

## 2024-10-14 DIAGNOSIS — R55 Syncope and collapse: Secondary | ICD-10-CM | POA: Diagnosis present

## 2024-10-14 DIAGNOSIS — I509 Heart failure, unspecified: Secondary | ICD-10-CM | POA: Insufficient documentation

## 2024-10-14 LAB — COMPREHENSIVE METABOLIC PANEL WITH GFR
ALT: 28 U/L (ref 0–44)
AST: 34 U/L (ref 15–41)
Albumin: 2.8 g/dL — ABNORMAL LOW (ref 3.5–5.0)
Alkaline Phosphatase: 217 U/L — ABNORMAL HIGH (ref 38–126)
Anion gap: 9 (ref 5–15)
BUN: 28 mg/dL — ABNORMAL HIGH (ref 8–23)
CO2: 23 mmol/L (ref 22–32)
Calcium: 7.5 mg/dL — ABNORMAL LOW (ref 8.9–10.3)
Chloride: 105 mmol/L (ref 98–111)
Creatinine, Ser: 1.46 mg/dL — ABNORMAL HIGH (ref 0.44–1.00)
GFR, Estimated: 39 mL/min — ABNORMAL LOW (ref >60)
Glucose, Bld: 137 mg/dL — ABNORMAL HIGH (ref 70–99)
Potassium: 4.3 mmol/L (ref 3.5–5.1)
Sodium: 137 mmol/L (ref 135–145)
Total Bilirubin: 0.8 mg/dL (ref 0.0–1.2)
Total Protein: 6.5 g/dL (ref 6.5–8.1)

## 2024-10-14 LAB — CBC WITH DIFFERENTIAL/PLATELET
Abs Immature Granulocytes: 0.03 K/uL (ref 0.00–0.07)
Basophils Absolute: 0 K/uL (ref 0.0–0.1)
Basophils Relative: 1 %
Eosinophils Absolute: 0.5 K/uL (ref 0.0–0.5)
Eosinophils Relative: 7 %
HCT: 32.7 % — ABNORMAL LOW (ref 36.0–46.0)
Hemoglobin: 9.6 g/dL — ABNORMAL LOW (ref 12.0–15.0)
Immature Granulocytes: 1 %
Lymphocytes Relative: 29 %
Lymphs Abs: 1.9 K/uL (ref 0.7–4.0)
MCH: 24.4 pg — ABNORMAL LOW (ref 26.0–34.0)
MCHC: 29.4 g/dL — ABNORMAL LOW (ref 30.0–36.0)
MCV: 83 fL (ref 80.0–100.0)
Monocytes Absolute: 0.4 K/uL (ref 0.1–1.0)
Monocytes Relative: 6 %
Neutro Abs: 3.8 K/uL (ref 1.7–7.7)
Neutrophils Relative %: 56 %
Platelets: 282 K/uL (ref 150–400)
RBC: 3.94 MIL/uL (ref 3.87–5.11)
RDW: 15.8 % — ABNORMAL HIGH (ref 11.5–15.5)
WBC: 6.6 K/uL (ref 4.0–10.5)
nRBC: 0 % (ref 0.0–0.2)

## 2024-10-14 LAB — CBG MONITORING, ED: Glucose-Capillary: 135 mg/dL — ABNORMAL HIGH (ref 70–99)

## 2024-10-14 LAB — BRAIN NATRIURETIC PEPTIDE: B Natriuretic Peptide: 119.9 pg/mL — ABNORMAL HIGH (ref 0.0–100.0)

## 2024-10-14 LAB — TROPONIN I (HIGH SENSITIVITY): Troponin I (High Sensitivity): 23 ng/L — ABNORMAL HIGH (ref <18)

## 2024-10-14 MED ORDER — ZOLPIDEM TARTRATE 5 MG PO TABS
5.0000 mg | ORAL_TABLET | Freq: Once | ORAL | Status: AC
  Administered 2024-10-14: 5 mg via ORAL
  Filled 2024-10-14: qty 1

## 2024-10-14 MED ORDER — BENZONATATE 100 MG PO CAPS
100.0000 mg | ORAL_CAPSULE | Freq: Once | ORAL | Status: AC
  Administered 2024-10-14: 100 mg via ORAL
  Filled 2024-10-14: qty 1

## 2024-10-14 MED ORDER — APIXABAN 5 MG PO TABS
5.0000 mg | ORAL_TABLET | Freq: Two times a day (BID) | ORAL | Status: DC
  Administered 2024-10-14: 5 mg via ORAL
  Filled 2024-10-14: qty 1

## 2024-10-14 NOTE — Discharge Instructions (Signed)
 As we discussed, your workup in the ER is reassuring  Please stay hydrated and follow-up with your doctor  Return to ER if you have another episode of passing out or chest pain

## 2024-10-14 NOTE — ED Provider Notes (Signed)
 [facility] AT [facility] Provider Note   CSN: [id number] Arrival date & time: 10/14/24  1237     Patient presents with: Near Syncope   Maureen Jackson is a 68 y.o. female history of A-fib on Eliquis , heart failure, AKI here presenting with near syncope.  Patient states that she was at the eye doctors office.  She was diagnosed with glaucoma and was prescribed eyedrops.  She states that while in the office, she felt lightheaded and dizzy and almost passed out.  She states that she did not eat anything since 7 AM this morning.  Patient denies any chest pain.  Patient has baseline shortness of breath and that was recently admitted for heart failure exacerbation.  Patient states that her leg swelling has not changed.   The history is provided by the patient.       Prior to Admission medications   Medication Sig Start Date End Date Taking? Authorizing Provider  acetaminophen  (TYLENOL ) 325 MG tablet Take 2 tablets (650 mg total) by mouth every 4 (four) hours as needed for mild pain, fever or headache. 05/25/20   Pearlean, Courage, MD  apixaban  (ELIQUIS ) 5 MG TABS tablet Take 1 tablet (5 mg total) by mouth 2 (two) times daily. 05/25/24   Evonnie Lenis, MD  atorvastatin  (LIPITOR ) 80 MG tablet TAKE 1 TABLET(80 MG) BY MOUTH DAILY Patient not taking: Reported on 09/15/2024 09/15/24   Alvan Dorn FALCON, MD  Blood Pressure Monitoring (OMRON 3 SERIES BP MONITOR) DEVI Use as directed 08/27/23   Miriam Norris, NP  calcium  carbonate (TUMS - DOSED IN MG ELEMENTAL CALCIUM ) 500 MG chewable tablet Chew 4 tablets by mouth 2 (two) times daily.    [provider]  cetirizine  (ZYRTEC ) 10 MG tablet Take 10 mg by mouth daily as needed for allergies.  09/12/18   [provider]  COMBIGAN  0.2-0.5 % ophthalmic solution Place 1 drop into both eyes 2 (two) times daily. 02/08/22   [provider]  furosemide  (LASIX ) 40 MG tablet Take 1.5 tablets (60 mg total) by mouth  daily. 08/01/24   Milford, Harlene HERO, FNP  glucose blood (ONETOUCH VERIO) test strip Test glucose 4 times day. 04/19/22   Nida, Gebreselassie W, MD  JARDIANCE  25 MG TABS tablet Take 25 mg by mouth daily. 07/21/24   [provider]  levothyroxine  (SYNTHROID ) 200 MCG tablet Take 200 mcg by mouth daily before breakfast. Take 1 tablet with 50mcg for a total dose of 250mcg daily    [provider]  levothyroxine  (SYNTHROID ) 50 MCG tablet Take 1.5 tablets (75 mcg total) by mouth daily before breakfast. Take 1 tablet with 200mcg for a total dose of 275mcg daily 09/20/24   Bryn Bernardino NOVAK, MD  losartan  (COZAAR ) 25 MG tablet Take 1 tablet (25 mg total) by mouth daily. 09/21/24   Bryn Bernardino NOVAK, MD  metoprolol  succinate (TOPROL -XL) 25 MG 24 hr tablet TAKE 1 TABLET(25 MG) BY MOUTH DAILY 02/18/24   Miriam Norris, NP  NOVOLOG  FLEXPEN 100 UNIT/ML FlexPen SMARTSIG:20 Unit(s) SUB-Q 3 Times Daily 07/14/24   [provider]  ofloxacin (OCUFLOX) 0.3 % ophthalmic solution Place 1 drop into both eyes 4 (four) times daily. 09/20/24   Bryn Bernardino NOVAK, MD  OZEMPIC, 0.25 OR 0.5 MG/DOSE, 2 MG/3ML SOPN Inject 0.5 mg into the skin once a week. Fridays 05/03/23   [provider]  senna-docusate (SENOKOT-S) 8.6-50 MG tablet Take 2 tablets by mouth 2 (two) times daily. Patient taking differently:  Take 2 tablets by mouth at bedtime as needed for mild constipation. 05/25/20   Pearlean Manus, MD  traMADol  (ULTRAM ) 50 MG tablet Take 1 tablet (50 mg total) by mouth every 6 (six) hours as needed for moderate pain (pain score 4-6) or severe pain (pain score 7-10). 09/20/24   Bryn Bernardino NOVAK, MD  VENTOLIN  HFA 108 (90 Base) MCG/ACT inhaler Inhale 1-2 puffs into the lungs every 6 (six) hours as needed for wheezing or shortness of breath. 05/25/20   Pearlean Manus, MD    Allergies: Ampicillin , Aspirin , Hydrocodone , and Unasyn  [ampicillin -sulbactam sodium ]    Review of Systems  Respiratory:  Positive for shortness  of breath.   Neurological:  Positive for dizziness.  All other systems reviewed and are negative.   Updated Vital Signs BP 135/76   Pulse 79   Temp 98 F (36.7 C)   Resp 20   Ht 5' 5 (1.651 m)   Wt (!) 151 kg   SpO2 98%   BMI 55.40 kg/m   Physical Exam Vitals and nursing note reviewed.  Constitutional:      Appearance: Normal appearance.  HENT:     Head: Normocephalic.     Nose: Nose normal.     Mouth/Throat:     Mouth: Mucous membranes are moist.  Eyes:     Extraocular Movements: Extraocular movements intact.     Pupils: Pupils are equal, round, and reactive to light.  Cardiovascular:     Rate and Rhythm: Normal rate and regular rhythm.     Pulses: Normal pulses.     Heart sounds: Normal heart sounds.  Pulmonary:     Effort: Pulmonary effort is normal.     Comments: Diminished bilaterally Abdominal:     General: Abdomen is flat.     Palpations: Abdomen is soft.  Musculoskeletal:     Cervical back: Normal range of motion and neck supple.     Comments: 1+ edema bilaterally   Skin:    General: Skin is warm.  Neurological:     General: No focal deficit present.     Mental Status: She is alert and oriented to person, place, and time.  Psychiatric:        Mood and Affect: Mood normal.        Behavior: Behavior normal.     (all labs ordered are listed, but only abnormal results are displayed) Labs Reviewed  COMPREHENSIVE METABOLIC PANEL WITH GFR - Abnormal; Notable for the following components:      Result Value   Glucose, Bld 137 (*)    BUN 28 (*)    Creatinine, Ser 1.46 (*)    Calcium  7.5 (*)    Albumin  2.8 (*)    Alkaline Phosphatase 217 (*)    GFR, Estimated 39 (*)    All other components within normal limits  CBC WITH DIFFERENTIAL/PLATELET - Abnormal; Notable for the following components:   Hemoglobin 9.6 (*)    HCT 32.7 (*)    MCH 24.4 (*)    MCHC 29.4 (*)    RDW 15.8 (*)    All other components within normal limits  CBG MONITORING, ED -  Abnormal; Notable for the following components:   Glucose-Capillary 135 (*)    All other components within normal limits  URINALYSIS, ROUTINE W REFLEX MICROSCOPIC  BRAIN NATRIURETIC PEPTIDE  TROPONIN I (HIGH SENSITIVITY)    EKG: EKG Interpretation Date/Time:  Tuesday October 14 2024 13:15:30 EST Ventricular Rate:  78 PR Interval:  150 QRS Duration:  152 QT Interval:  460 QTC Calculation: 524 R Axis:   10  Text Interpretation: Normal sinus rhythm Left bundle branch block Abnormal ECG When compared with ECG of 14-Oct-2024 13:09, PREVIOUS ECG IS PRESENT No significant change since last tracing Confirmed by Zackowski, Scott [phone]) on 10/14/2024 1:37:08 PM  Radiology: [facility] Chest Port 1 View Result Date: 10/14/2024 CLINICAL DATA:  Shortness of breath. EXAM: PORTABLE CHEST 1 VIEW COMPARISON:  Chest radiograph dated 09/14/2024. FINDINGS: Cardiomegaly with vascular congestion. No focal consolidation, pleural effusion or pneumothorax. No acute osseous pathology. IMPRESSION: Cardiomegaly with vascular congestion. Electronically Signed   By: Vanetta Chou M.D.   On: 10/14/2024 19:35     Procedures   Medications Ordered in the ED - No data to display                                  Medical Decision Making Maureen Jackson is a 68 y.o. female here presenting with shortness of breath and near syncope.  Likely vasovagal syncope versus dehydration.  Patient recently was admitted for heart failure exacerbation and appears to be euvolemic.  Plan to get orthostatics labs and BMP and chest x-ray.  Will p.o. challenge and reassess  9:13 PM Patient is orthostatic only by heart rate criteria.  Patient's creatinine is improved from previous.  Troponin is the same and BNP has improved.  Patient ate and drink and felt better.  Unfortunately patient lives in [place] and daughter cannot pick her up.  We tried to arrange for transportation but unable to.  Patient will be pending discharge and social work  will help to work on transportation  Amount and/or Complexity of Data Reviewed Labs: ordered. Decision-making details documented in ED Course. Radiology: ordered and independent interpretation performed. Decision-making details documented in ED Course.     Final diagnoses:  None    ED Discharge Orders     None          Patt Alm Macho, MD 10/14/24 2114

## 2024-10-14 NOTE — ED Notes (Signed)
 Pt states her daughter is unable to pick her up at this time and can pick her up in the morning around 830am. Pt lives with daughter in [place]. Beth Air Traffic Controller with social work and [employer] reports they are unable to provide taxi voucher for pt outside of [place]. MD notified and pt moved to ED purple zone until morning when daughter is able to pick her up to take her home. Pt agreeable with plan.  Pt provided snacks and sandwhich bag, pt tolerated well.

## 2024-10-14 NOTE — TOC Initial Note (Signed)
 Transition of Care [facility]) - Initial/Assessment Note    Patient Details  Name: Maureen Jackson MRN: [id number] Date of Birth: 08/22/1956  [facility]) CM/SW Contact:    Hartley KATHEE Robertson, LCSWA Phone Number: 10/14/2024, 10:43 PM  Clinical Narrative:                  CSW received consult for transportation needs, received call from RN requesting taxi voucher for pt who lives in [place], CSW advised [facility] would need to be contacted as CSW could only approve taxi voucher for [place] area.         Patient Goals and CMS Choice            Expected Discharge Plan and Services                                              Prior Living Arrangements/Services                       Activities of Daily Living      Permission Sought/Granted                  Emotional Assessment              Admission diagnosis:  near syncope Patient Active Problem List   Diagnosis Date Noted   AKI (acute kidney injury) 09/17/2024   Acute bacterial conjunctivitis of left eye 09/15/2024   Prolonged QT interval 09/15/2024   Chronic kidney disease, stage 3b (HCC) 09/15/2024   Type 2 diabetes mellitus with hyperglycemia (HCC) 09/15/2024   History of DVT (deep vein thrombosis) 09/15/2024   Anemia, iron  deficiency 08/18/2024   Acute DVT (deep venous thrombosis) (HCC) 05/20/2024   Acute on chronic systolic CHF (congestive heart failure) (HCC) 05/16/2024   Cardiac tamponade 12/21/2022   Acute on chronic combined systolic and diastolic CHF (congestive heart failure) (HCC) 12/20/2022   Acute on chronic combined systolic (congestive) and diastolic (congestive) heart failure (HCC) 05/18/2020   Acute respiratory disease due to COVID-19 virus 12/26/2019   Pressure injury of skin 09/21/2019   Acute renal failure superimposed on stage 3b chronic kidney disease (HCC)    Drug rash    Abscess    Fistula    Fournier's gangrene in female [facility])    Chest pain  07/16/2018   Mixed hyperlipidemia 04/10/2018   TIA (transient ischemic attack) 03/14/2018   Vertigo 03/14/2018   Chronic combined systolic and diastolic CHF (congestive heart failure) (EF 30 to 35 %) 03/14/2018   Ischemic cardiomyopathy 11/06/2017   Coronary artery disease involving native coronary artery of native heart without angina pectoris    Pericardial effusion 09/07/2017   Elevated troponin 09/07/2017   Acute combined systolic (congestive) and diastolic (congestive) heart failure (HCC) 09/06/2017   Cardiomegaly 09/05/2017   Morbid obesity/BMI > 55 03/04/2013   Labyrinthitis 03/04/2013   DM type 2 causing vascular disease (HCC) 03/04/2013   GASTRITIS 12/13/2006   HIATAL HERNIA, HX OF 12/13/2006   S/P thyroidectomy 12/13/2006   Acquired hypothyroidism 10/04/2006   TOBACCO ABUSE 10/04/2006   CARPAL TUNNEL SYNDROME 10/04/2006   Essential hypertension, benign 10/04/2006   GERD 10/04/2006   POSTMENOPAUSAL STATUS 10/04/2006   SKIN TAG 10/04/2006   KNEE PAIN, LEFT 10/04/2006   Sleep apnea 10/04/2006   LEG EDEMA 10/04/2006   PCP:  Orpha,  Yancey LABOR, MD Pharmacy:   [facility] [phone] - [place], [place] - [address] [place] [address] Phone: [phone] Fax: [phone]     Social Drivers of Health (SDOH) Social History: SDOH Screenings   Food Insecurity: No Food Insecurity (09/15/2024)  Housing: Low Risk  (09/15/2024)  Transportation Needs: Unmet Transportation Needs (09/20/2024)  Utilities: Not At Risk (09/15/2024)  Alcohol Screen: Low Risk  (12/22/2022)  Depression (PHQ2-9): Low Risk  (01/01/2020)  Financial Resource Strain: Low Risk  (05/19/2024)  Social Connections: Unknown (09/15/2024)  Tobacco Use: Medium Risk (10/14/2024)   SDOH Interventions:     Readmission Risk Interventions    09/19/2024    3:34 PM 09/15/2024   11:39 AM 05/22/2024    2:03 PM  Readmission Risk Prevention Plan   Transportation Screening Complete Complete Complete  Home Care Screening   Complete  Medication Review (RN CM)   Complete  HRI or Home Care Consult Complete Complete   Social Work Consult for Recovery Care Planning/Counseling Complete Complete   Palliative Care Screening Not Applicable Not Applicable   Medication Review Oceanographer) Complete Complete

## 2024-10-14 NOTE — ED Triage Notes (Signed)
 PT bib EMS from eye doctor. She had a near syncopal episode while sitting in the exam chair. She was weak, dizzy, and hot. EMS reports no LOC but on arrival she Pale, no radials, and confused. EMS was unable to get BP while she was sitting up. Once pt was laid on EMS stretcher she returned to baseline.  EMS reports she having recent issues and has had multiple med changes lately.

## 2024-10-14 NOTE — ED Provider Triage Note (Signed)
 Emergency Medicine Provider Triage Evaluation Note  Maureen Jackson , a 68 y.o. female  was evaluated in triage.  Pt complains of feeling tired.  Patient was at the eye doctors office for routine visit when she had a near syncopal episode did not pass out completely.  She was sitting in the exam chair.  She was weak dizzy and hot.  When EMS arrived patient was pale they could not pick up any pulses but when they laid her flat she came back to normal.  Patient states she has passed out before denies any chest pain shortness of breath denies any headache neck pain or back pain.  She did not fall out of the chair.  Says that she has passed out before because of her blood pressure getting too low.  Blood pressure here was 127/82 oxygen  sats 100% on room air.  Past medical history sniffer hypertension glaucoma diabetes morbid obesity struct of sleep apnea coronary artery disease chronic systolic congestive heart failure.  Review of Systems  Positive: As above Negative: As above  Physical Exam  BP 127/82   Pulse 77   Temp 97.9 F (36.6 C) (Oral)   Resp 17   Ht 1.651 m (5' 5)   Wt (!) 151 kg   SpO2 100%   BMI 55.40 kg/m  Gen: Awake, no distress but drowsy Resp: Normal effort no respiratory distress MSK: Moves extremities without difficulty no significant edema Neuro: Nose significant neurodeficit Abdomen: Soft nontender Other:  Medical Decision Making  Medically screening exam initiated at 12:51 PM.  Appropriate orders placed.  [name] was informed that the remainder of the evaluation will be completed by another provider, this initial triage assessment does not replace that evaluation, and the importance of remaining in the ED until their evaluation is complete.  Anuric to be sounds as if it was may be vasovagal because she never was completely out when he laid her flat she get back to normal.  Blood pressures are fine here.  Will get labs.  And EKG.  Patient stable to go to waiting  room.   Rion Schnitzer, MD 10/14/24 1258

## 2024-10-16 ENCOUNTER — Other Ambulatory Visit: Payer: Self-pay

## 2024-10-16 ENCOUNTER — Encounter (HOSPITAL_COMMUNITY): Payer: Self-pay | Admitting: Emergency Medicine

## 2024-10-16 ENCOUNTER — Emergency Department (HOSPITAL_COMMUNITY)
Admission: EM | Admit: 2024-10-16 | Discharge: 2024-10-16 | Disposition: A | Discharge status: Home / Self Care | Attending: Emergency Medicine | Admitting: Emergency Medicine

## 2024-10-16 DIAGNOSIS — E1122 Type 2 diabetes mellitus with diabetic chronic kidney disease: Secondary | ICD-10-CM | POA: Diagnosis not present

## 2024-10-16 DIAGNOSIS — H5712 Ocular pain, left eye: Secondary | ICD-10-CM | POA: Diagnosis present

## 2024-10-16 DIAGNOSIS — H2102 Hyphema, left eye: Secondary | ICD-10-CM | POA: Insufficient documentation

## 2024-10-16 DIAGNOSIS — I5022 Chronic systolic (congestive) heart failure: Secondary | ICD-10-CM | POA: Diagnosis not present

## 2024-10-16 DIAGNOSIS — Z7901 Long term (current) use of anticoagulants: Secondary | ICD-10-CM | POA: Insufficient documentation

## 2024-10-16 DIAGNOSIS — I130 Hypertensive heart and chronic kidney disease with heart failure and stage 1 through stage 4 chronic kidney disease, or unspecified chronic kidney disease: Secondary | ICD-10-CM | POA: Insufficient documentation

## 2024-10-16 DIAGNOSIS — Z79890 Hormone replacement therapy (postmenopausal): Secondary | ICD-10-CM | POA: Insufficient documentation

## 2024-10-16 DIAGNOSIS — I2510 Atherosclerotic heart disease of native coronary artery without angina pectoris: Secondary | ICD-10-CM | POA: Insufficient documentation

## 2024-10-16 DIAGNOSIS — H40212 Acute angle-closure glaucoma, left eye: Secondary | ICD-10-CM | POA: Insufficient documentation

## 2024-10-16 DIAGNOSIS — E039 Hypothyroidism, unspecified: Secondary | ICD-10-CM | POA: Insufficient documentation

## 2024-10-16 DIAGNOSIS — N183 Chronic kidney disease, stage 3 unspecified: Secondary | ICD-10-CM | POA: Insufficient documentation

## 2024-10-16 DIAGNOSIS — Z79899 Other long term (current) drug therapy: Secondary | ICD-10-CM | POA: Insufficient documentation

## 2024-10-16 DIAGNOSIS — H409 Unspecified glaucoma: Secondary | ICD-10-CM

## 2024-10-16 LAB — CBG MONITORING, ED: Glucose-Capillary: 152 mg/dL — ABNORMAL HIGH (ref 70–99)

## 2024-10-16 MED ORDER — TIMOLOL MALEATE 0.5 % OP SOLN
1.0000 [drp] | OPHTHALMIC | Status: AC
  Administered 2024-10-16 (×3): 1 [drp] via OPHTHALMIC
  Filled 2024-10-16: qty 5

## 2024-10-16 MED ORDER — ACETAZOLAMIDE ER 500 MG PO CP12
500.0000 mg | ORAL_CAPSULE | Freq: Two times a day (BID) | ORAL | Status: DC
  Administered 2024-10-16: 500 mg via ORAL
  Filled 2024-10-16 (×2): qty 1

## 2024-10-16 MED ORDER — TETRACAINE HCL 0.5 % OP SOLN
2.0000 [drp] | Freq: Once | OPHTHALMIC | Status: AC
  Administered 2024-10-16: 2 [drp] via OPHTHALMIC
  Filled 2024-10-16: qty 4

## 2024-10-16 MED ORDER — LATANOPROST 0.005 % OP SOLN
1.0000 [drp] | OPHTHALMIC | Status: AC
  Administered 2024-10-16 (×3): 1 [drp] via OPHTHALMIC
  Filled 2024-10-16: qty 2.5

## 2024-10-16 MED ORDER — BRIMONIDINE TARTRATE 0.2 % OP SOLN
1.0000 [drp] | OPHTHALMIC | Status: AC
  Administered 2024-10-16 (×3): 1 [drp] via OPHTHALMIC
  Filled 2024-10-16: qty 5

## 2024-10-16 MED ORDER — FLUORESCEIN SODIUM 1 MG OP STRP
1.0000 | ORAL_STRIP | Freq: Once | OPHTHALMIC | Status: AC
  Administered 2024-10-16: 1 via OPHTHALMIC
  Filled 2024-10-16: qty 1

## 2024-10-16 MED ORDER — LATANOPROST 0.005 % OP SOLN: 1.0000 [drp] | Freq: Every day | OPHTHALMIC | 12 refills | Status: AC

## 2024-10-16 MED ORDER — DORZOLAMIDE HCL 2 % OP SOLN: 1.0000 [drp] | Freq: Two times a day (BID) | OPHTHALMIC | 12 refills | Status: AC

## 2024-10-16 MED ORDER — DORZOLAMIDE HCL 2 % OP SOLN
1.0000 [drp] | OPHTHALMIC | Status: AC
  Administered 2024-10-16 (×3): 1 [drp] via OPHTHALMIC
  Filled 2024-10-16: qty 10

## 2024-10-16 MED ORDER — OXYCODONE HCL 5 MG PO TABS
5.0000 mg | ORAL_TABLET | Freq: Once | ORAL | Status: AC
  Administered 2024-10-16: 5 mg via ORAL
  Filled 2024-10-16: qty 1

## 2024-10-16 NOTE — ED Notes (Signed)
 Pt with corrective lens. Both- 20/25, Left- 20/200, Right- 20/25

## 2024-10-16 NOTE — ED Triage Notes (Signed)
 Pt from home. Pt c/o left eye pain from glaucoma. Was supposed to have a recent procedure but bp was too low so it wasn't done. Pt arrived [facility] a/o. Nad. States has nothing for pain. When asked about tylenol  she stated she took it this am but didn't help.

## 2024-10-16 NOTE — ED Provider Notes (Signed)
 [place] EMERGENCY DEPARTMENT AT [facility] Provider Note   CSN: [id number] Arrival date & time: 10/16/24  1455     History  Chief Complaint  Patient presents with   Eye Pain    Maureen Jackson is a 68 y.o. female with combined HFrEF, T2DM, history DVT, prolonged QT, CKD stage III, acute bacterial conjunctivitis of left eye, HLD, CAD status post DES 2018, morbid obesity, acquired hypothyroidism, OSA, glaucoma who presents with c/o left eye pain from glaucoma. Was supposed to have a procedure for the glaucoma at [facility] in [place] with Dr. Corbin on Tuesday but BP was too low so it wasn't done. States has nothing for pain. When asked about tylenol  she stated she took it this am but didn't help.  She always has blurry vision in that eye and it is not any worse today but the pain is what brought her in.  She states it does sometimes feel like she has a grain of sand on the eye but the pain is in the eye as well.  No swelling or trauma to the eye.  Recently admitted from 09/14/2024 to 09/20/2024 for acute on chronic combined systolic and diastolic heart failure.  During that admission she was noted to have a suspected left corneal abrasion and acute bacterial conjunctivitis.  She had normal tonometry.  Dr. Austin with ophthalmology recommended continuing Combigan  and topical ofloxacin.   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 multivessel CAD by cath in 09/2017 and not felt to be a CABG candidate --> underwent two-vessel PCI with DES to the LAD and DES to the RCA   Diabetes mellitus    Diabetes mellitus, type II (HCC)    Dyspnea    Glaucoma    Hypertension    Left bundle branch block    Morbid obesity (HCC)    Obstructive sleep apnea    does not wear CPAP   Thyroid  disease        Home  Medications Prior to Admission medications   Medication Sig Start Date End Date Taking? Authorizing Provider  acetaminophen  (TYLENOL ) 325 MG tablet Take 2 tablets (650 mg total) by mouth every 4 (four) hours as needed for mild pain, fever or headache. 05/25/20   Pearlean Manus, MD  apixaban  (ELIQUIS ) 5 MG TABS tablet Take 1 tablet (5 mg total) by mouth 2 (two) times daily. 05/25/24   Evonnie Lenis, MD  atorvastatin  (LIPITOR ) 80 MG tablet TAKE 1 TABLET(80 MG) BY MOUTH DAILY Patient not taking: Reported on 09/15/2024 09/15/24   Alvan Dorn FALCON, MD  Blood Pressure Monitoring (OMRON 3 SERIES BP MONITOR) DEVI Use as directed 08/27/23   Miriam Norris, NP  calcium  carbonate (TUMS - DOSED IN MG ELEMENTAL CALCIUM ) 500 MG chewable tablet Chew 4 tablets by mouth 2 (two) times daily.    [provider]  cetirizine  (ZYRTEC ) 10 MG tablet Take 10 mg by mouth daily as needed for allergies.  09/12/18   [provider]  COMBIGAN  0.2-0.5 % ophthalmic solution Place 1 drop into both eyes 2 (two) times daily. 02/08/22   [provider]  furosemide  (LASIX ) 40 MG tablet Take 1.5 tablets (60 mg total) by mouth daily. 08/01/24   Glena Harlene HERO, FNP  glucose  blood (ONETOUCH VERIO) test strip Test glucose 4 times day. 04/19/22   Nida, Gebreselassie W, MD  JARDIANCE  25 MG TABS tablet Take 25 mg by mouth daily. 07/21/24   [provider]  levothyroxine  (SYNTHROID ) 200 MCG tablet Take 200 mcg by mouth daily before breakfast. Take 1 tablet with 50mcg for a total dose of 250mcg daily    [provider]  levothyroxine  (SYNTHROID ) 50 MCG tablet Take 1.5 tablets (75 mcg total) by mouth daily before breakfast. Take 1 tablet with 200mcg for a total dose of 275mcg daily 09/20/24   Bryn Bernardino NOVAK, MD  losartan  (COZAAR ) 25 MG tablet Take 1 tablet (25 mg total) by mouth daily. 09/21/24   Bryn Bernardino NOVAK, MD  metoprolol  succinate (TOPROL -XL) 25 MG 24 hr tablet TAKE 1 TABLET(25 MG) BY MOUTH DAILY  02/18/24   Miriam Norris, NP  NOVOLOG  FLEXPEN 100 UNIT/ML FlexPen SMARTSIG:20 Unit(s) SUB-Q 3 Times Daily 07/14/24   [provider]  ofloxacin (OCUFLOX) 0.3 % ophthalmic solution Place 1 drop into both eyes 4 (four) times daily. 09/20/24   Bryn Bernardino NOVAK, MD  OZEMPIC, 0.25 OR 0.5 MG/DOSE, 2 MG/3ML SOPN Inject 0.5 mg into the skin once a week. Fridays 05/03/23   [provider]  senna-docusate (SENOKOT-S) 8.6-50 MG tablet Take 2 tablets by mouth 2 (two) times daily. Patient taking differently: Take 2 tablets by mouth at bedtime as needed for mild constipation. 05/25/20   Pearlean Manus, MD  traMADol  (ULTRAM ) 50 MG tablet Take 1 tablet (50 mg total) by mouth every 6 (six) hours as needed for moderate pain (pain score 4-6) or severe pain (pain score 7-10). 09/20/24   Bryn Bernardino NOVAK, MD  VENTOLIN  HFA 108 (90 Base) MCG/ACT inhaler Inhale 1-2 puffs into the lungs every 6 (six) hours as needed for wheezing or shortness of breath. 05/25/20   Pearlean Manus, MD      Allergies    Ampicillin , Aspirin , Hydrocodone , and Unasyn  [ampicillin -sulbactam sodium ]    Review of Systems   Review of Systems A 10 point review of systems was performed and is negative unless otherwise reported in HPI.  Physical Exam Updated Vital Signs BP (!) 157/89   Pulse 83   Temp (!) 96.8 F (36 C)   Resp 20   SpO2 100%  Physical Exam General: Normal appearing female, sitting in chair. HEENT: PERRLA of right eye.  Left eye with midrange pupil, hazy cornea, injected conjunctive, 30% hyphema.  Bilateral EOMI.  Visual acuity on the left is 20/200.  Visual acuity on the right is 20/50.  Fluorescein stain does not demonstrate any corneal abrasion on the left.  Pressure on the right is 21 mmHg, pressure on the left is undetectably high.  Sclera anicteric, MMM, trachea midline.  Cardiology: RRR Resp: Normal respiratory rate and effort.  Abd: Soft, non-tender, non-distended. No rebound tenderness or guarding.  GU:  Deferred. MSK: No peripheral edema or signs of trauma.  Skin: warm, dry.  Neuro: A&Ox4, CNs II-XII grossly intact. MAEs. Sensation grossly intact.  Psych: Normal mood and affect.   ED Results / Procedures / Treatments   Labs (all labs ordered are listed, but only abnormal results are displayed) Labs Reviewed  CBG MONITORING, ED - Abnormal; Notable for the following components:      Result Value   Glucose-Capillary 152 (*)    All other components within normal limits    EKG None  Radiology DG Chest Port 1 View Result Date: 10/14/2024 CLINICAL DATA:  Shortness  of breath. EXAM: PORTABLE CHEST 1 VIEW COMPARISON:  Chest radiograph dated 09/14/2024. FINDINGS: Cardiomegaly with vascular congestion. No focal consolidation, pleural effusion or pneumothorax. No acute osseous pathology. IMPRESSION: Cardiomegaly with vascular congestion. Electronically Signed   By: Vanetta Chou M.D.   On: 10/14/2024 19:35    Procedures .Critical Care  Performed by: Franklyn Sid SAILOR, MD Authorized by: Franklyn Sid SAILOR, MD   Critical care provider statement:    Critical care time (minutes):  45   Critical care was necessary to treat or prevent imminent or life-threatening deterioration of the following conditions: acute glaucoma with visual changes.   Critical care was time spent personally by me on the following activities:  Development of treatment plan with patient or surrogate, discussions with consultants, evaluation of patient's response to treatment, examination of patient, ordering and review of laboratory studies, ordering and performing treatments and interventions, pulse oximetry, re-evaluation of patient's condition and review of old charts     Medications Ordered in ED Medications  tetracaine (PONTOCAINE) 0.5 % ophthalmic solution 2 drop (2 drops Right Eye Given by Other 10/16/24 1724)  fluorescein ophthalmic strip 1 strip (1 strip Right Eye Given 10/16/24 1725)  oxyCODONE  (Oxy IR/ROXICODONE )  immediate release tablet 5 mg (5 mg Oral Given 10/16/24 1733)  timolol  (TIMOPTIC ) 0.5 % ophthalmic solution 1 drop (1 drop Left Eye Given 10/16/24 1937)  brimonidine  (ALPHAGAN ) 0.2 % ophthalmic solution 1 drop (1 drop Left Eye Given 10/16/24 1947)  latanoprost  (XALATAN ) 0.005 % ophthalmic solution 1 drop (1 drop Left Eye Given 10/16/24 1952)  dorzolamide (TRUSOPT) 2 % ophthalmic solution 1 drop (1 drop Left Eye Given 10/16/24 1942)    ED Course/ Medical Decision Making/ A&P                          Medical Decision Making Risk Prescription drug management.    This patient presents to the ED for concern of left eye pain, this involves an extensive number of treatment options, and is a complaint that carries with it a high risk of complications and morbidity.  I considered the following differential and admission for this acute, potentially life threatening condition.   MDM:    Patient w/ acute on chronic glaucoma. She states her vision is always blurry but came in for severe pain.  The pressure in her left eye is undetectably high and she has a hyphema as well.  Concern for acute glaucoma emergency.  Will page and discussed with ophthalmology and will give treatments.  She is given oxycodone  for pain initially.  No ocular trauma, no obvious corneal abrasion or corneal ulcer.  No evidence also considered conjunctivitis, uveitis, scleritis, endophthalmitis but less likely.   Clinical Course as of 10/18/24 0031  Thu Oct 16, 2024  1724 30% hyphema in L anterior chamber. Pressure in right eye is 21 mmHg, pressure in left eye is undetectably high on multiple readings.  Visual acuity in left eye is 20/200, and right eye is 20/50.  Paged ophthalmology [HN]  1750 Discussed with Dr. Marcey with ophthalmology who recommends latanoprost , brimonidine , timolol , and dorzolamide eyedrops given 5 to 10 minutes apart from each other in 2-3 rounds.  Also recommended 500 milligrams acetazolamide ER p.o.  Will recheck  the pressure every hour.  If her pressure does not go down in the next 2 to 3 hours will need to be transferred. [HN]  2032 Reevaluated patient after 3 rounds of all 4 drops as well  as p.o. acetazolamide.  The pressure in her left eye is now 28 mmHg.  Her pain has improved.  Pupil is a bit more responsive now but still minimally responsive and midrange.  Called and spoke with ophthalmologist Dr. Marcey again who recommended adding latanoprost  1 drop in left eye nightly and dorzolamide 2 drops in left eye twice daily.  He recommended being seen by ophthalmology tomorrow, whether by her own ophthalmologist Dr. Corbin or in his clinic tomorrow.  I discussed with the patient who reports understanding but she states that she has difficulty with transportation.  I asked her if her daughter could take her to an appointment tomorrow and she said maybe.  She states that when she request transportation it usually takes 3 days.  I discussed the importance of prompt follow-up with the patient who reports understanding.  I discussed adding the eyedrops and she reports understanding.  Given discharge instructions and return precautions, all questions answered to patient satisfaction. [HN]    Clinical Course User Index [HN] Franklyn Sid SAILOR, MD    Labs: I Ordered, and personally interpreted labs.  The pertinent results include: Glucose 152  Additional history obtained from chart review.    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 multivessel CAD by cath in 09/2017 and not felt to be a CABG  candidate --> underwent two-vessel PCI with DES to the LAD and DES to the RCA   Diabetes mellitus    Diabetes mellitus, type II (HCC)    Dyspnea    Glaucoma    Hypertension    Left bundle branch block    Morbid obesity (HCC)    Obstructive sleep apnea    does not wear CPAP   Thyroid  disease      Medicines Meds ordered this encounter  Medications   tetracaine (PONTOCAINE) 0.5 % ophthalmic solution 2 drop   fluorescein ophthalmic strip 1 strip    I have reviewed the patients home medicines and have made adjustments as needed  Problem List / ED Course: Problem List Items Addressed This Visit   None Visit Diagnoses       Acute on chronic glaucoma    -  Primary   Relevant Medications   tetracaine (PONTOCAINE) 0.5 % ophthalmic solution 2 drop (Completed)   fluorescein ophthalmic strip 1 strip (Completed)   timolol  (TIMOPTIC ) 0.5 % ophthalmic solution 1 drop (Completed)   brimonidine  (ALPHAGAN ) 0.2 % ophthalmic solution 1 drop (Completed)   latanoprost  (XALATAN ) 0.005 % ophthalmic solution 1 drop (Completed)   dorzolamide (TRUSOPT) 2 % ophthalmic solution 1 drop (Completed)   latanoprost  (XALATAN ) 0.005 % ophthalmic solution   dorzolamide (TRUSOPT) 2 % ophthalmic solution     Hyphema of left eye         Left eye pain                       This note was created using dictation software, which may contain spelling or grammatical errors.    Franklyn Sid SAILOR, MD 10/18/24 [id number]

## 2024-10-16 NOTE — Discharge Instructions (Addendum)
 Thank you for coming to [facility] Emergency Department. You were seen for left eye pain and blurry vision. We did an exam, labs, and these showed blood in the front of your eye (hyphema) and an undetectably high pressure in the left eye (acute glaucoma). We lowered your pressure emergently with eyedrops and oral medication.   Please add these eyedrops in addition to your Combigan  eyedrops: -Latanoprost  (xalatan ) one drop in your left eye at night before bed -Dorazolamide (trusopt) two drops into left eye twice per day  You need to be seen as soon as possible tomorrow by an ophthalmologist, whether that is your own ophthalmologist Dr. Corbin in [place] or by our on-call eye doctor Dr. Marcey in [facility]. Please call Dr. Melodee office tomorrow morning to make an appointment for as soon as possible. Please see if your daughter can drive you there.   Do not hesitate to return to the ED or call 911 if you experience: -Worsening symptoms -Worsening eye pain or vision -Lightheadedness, passing out -Fevers/chills -Anything else that concerns you

## 2024-10-16 NOTE — ED Notes (Signed)
 Medications not available in ED. EDP made aware

## 2024-10-30 ENCOUNTER — Ambulatory Visit (HOSPITAL_COMMUNITY): Admitting: Cardiology

## 2024-11-03 ENCOUNTER — Telehealth (HOSPITAL_COMMUNITY): Payer: Self-pay

## 2024-11-03 NOTE — Telephone Encounter (Signed)
 Called to confirm/remind patient of their appointment at [facility] on 11/04/24 10:30.   Appointment:   [x] Confirmed  [] Left mess   [] No answer/No voice mail  [] VM Full/unable to leave message  [] Phone not in service  Patient reminded to bring all medications and/or complete list.  Confirmed patient has transportation. Gave directions, instructed to utilize valet parking.

## 2024-11-04 ENCOUNTER — Ambulatory Visit (HOSPITAL_COMMUNITY)
Admission: RE | Admit: 2024-11-04 | Discharge: 2024-11-04 | Disposition: A | Discharge status: Home / Self Care | Source: Ambulatory Visit | Attending: Cardiology | Admitting: Cardiology

## 2024-11-04 ENCOUNTER — Telehealth (HOSPITAL_COMMUNITY): Payer: Self-pay

## 2024-11-04 ENCOUNTER — Other Ambulatory Visit (HOSPITAL_COMMUNITY): Payer: Self-pay

## 2024-11-04 ENCOUNTER — Encounter (HOSPITAL_COMMUNITY): Payer: Self-pay

## 2024-11-04 ENCOUNTER — Ambulatory Visit (HOSPITAL_COMMUNITY): Payer: Self-pay | Admitting: Cardiology

## 2024-11-04 VITALS — BP 138/84 | HR 86 | Ht 65.0 in | Wt 336.2 lb

## 2024-11-04 DIAGNOSIS — Z6841 Body Mass Index (BMI) 40.0 and over, adult: Secondary | ICD-10-CM | POA: Insufficient documentation

## 2024-11-04 DIAGNOSIS — I2510 Atherosclerotic heart disease of native coronary artery without angina pectoris: Secondary | ICD-10-CM | POA: Insufficient documentation

## 2024-11-04 DIAGNOSIS — I959 Hypotension, unspecified: Secondary | ICD-10-CM | POA: Diagnosis not present

## 2024-11-04 DIAGNOSIS — G4733 Obstructive sleep apnea (adult) (pediatric): Secondary | ICD-10-CM | POA: Diagnosis not present

## 2024-11-04 DIAGNOSIS — I130 Hypertensive heart and chronic kidney disease with heart failure and stage 1 through stage 4 chronic kidney disease, or unspecified chronic kidney disease: Secondary | ICD-10-CM | POA: Insufficient documentation

## 2024-11-04 DIAGNOSIS — Z86718 Personal history of other venous thrombosis and embolism: Secondary | ICD-10-CM | POA: Insufficient documentation

## 2024-11-04 DIAGNOSIS — Z7982 Long term (current) use of aspirin: Secondary | ICD-10-CM | POA: Insufficient documentation

## 2024-11-04 DIAGNOSIS — N183 Chronic kidney disease, stage 3 unspecified: Secondary | ICD-10-CM | POA: Diagnosis not present

## 2024-11-04 DIAGNOSIS — Z79899 Other long term (current) drug therapy: Secondary | ICD-10-CM | POA: Insufficient documentation

## 2024-11-04 DIAGNOSIS — Z87891 Personal history of nicotine dependence: Secondary | ICD-10-CM | POA: Diagnosis not present

## 2024-11-04 DIAGNOSIS — I428 Other cardiomyopathies: Secondary | ICD-10-CM | POA: Insufficient documentation

## 2024-11-04 DIAGNOSIS — E039 Hypothyroidism, unspecified: Secondary | ICD-10-CM | POA: Diagnosis not present

## 2024-11-04 DIAGNOSIS — Z7984 Long term (current) use of oral hypoglycemic drugs: Secondary | ICD-10-CM | POA: Diagnosis not present

## 2024-11-04 DIAGNOSIS — I10 Essential (primary) hypertension: Secondary | ICD-10-CM

## 2024-11-04 DIAGNOSIS — E1122 Type 2 diabetes mellitus with diabetic chronic kidney disease: Secondary | ICD-10-CM | POA: Diagnosis not present

## 2024-11-04 DIAGNOSIS — I3139 Other pericardial effusion (noninflammatory): Secondary | ICD-10-CM | POA: Diagnosis not present

## 2024-11-04 DIAGNOSIS — I5022 Chronic systolic (congestive) heart failure: Secondary | ICD-10-CM | POA: Diagnosis not present

## 2024-11-04 DIAGNOSIS — Z7985 Long-term (current) use of injectable non-insulin antidiabetic drugs: Secondary | ICD-10-CM | POA: Insufficient documentation

## 2024-11-04 DIAGNOSIS — I447 Left bundle-branch block, unspecified: Secondary | ICD-10-CM | POA: Insufficient documentation

## 2024-11-04 DIAGNOSIS — Z794 Long term (current) use of insulin: Secondary | ICD-10-CM | POA: Insufficient documentation

## 2024-11-04 DIAGNOSIS — Z955 Presence of coronary angioplasty implant and graft: Secondary | ICD-10-CM | POA: Diagnosis not present

## 2024-11-04 DIAGNOSIS — D509 Iron deficiency anemia, unspecified: Secondary | ICD-10-CM | POA: Insufficient documentation

## 2024-11-04 DIAGNOSIS — D508 Other iron deficiency anemias: Secondary | ICD-10-CM

## 2024-11-04 LAB — BASIC METABOLIC PANEL WITH GFR
Anion gap: 8 (ref 5–15)
BUN: 27 mg/dL — ABNORMAL HIGH (ref 8–23)
CO2: 23 mmol/L (ref 22–32)
Calcium: 8.1 mg/dL — ABNORMAL LOW (ref 8.9–10.3)
Chloride: 108 mmol/L (ref 98–111)
Creatinine, Ser: 1.44 mg/dL — ABNORMAL HIGH (ref 0.44–1.00)
GFR, Estimated: 40 mL/min — ABNORMAL LOW (ref >60)
Glucose, Bld: 160 mg/dL — ABNORMAL HIGH (ref 70–99)
Potassium: 4.1 mmol/L (ref 3.5–5.1)
Sodium: 139 mmol/L (ref 135–145)

## 2024-11-04 LAB — BRAIN NATRIURETIC PEPTIDE: B Natriuretic Peptide: 43.3 pg/mL (ref 0.0–100.0)

## 2024-11-04 MED ORDER — ENTRESTO 24-26 MG PO TABS: 1.0000 | ORAL_TABLET | Freq: Two times a day (BID) | ORAL | 3 refills | Status: DC

## 2024-11-04 MED ORDER — FUROSEMIDE 40 MG PO TABS: 80.0000 mg | ORAL_TABLET | Freq: Every day | ORAL | 3 refills | Status: DC

## 2024-11-04 MED ORDER — SACUBITRIL-VALSARTAN 24-26 MG PO TABS: 1.0000 | ORAL_TABLET | Freq: Two times a day (BID) | ORAL | 11 refills | Status: DC

## 2024-11-04 NOTE — Progress Notes (Addendum)
 ADVANCED HEART FAILURE CLINIC NOTE  Primary Care: Orpha Yancey LABOR, MD Primary Cardiologist: Dr. Dorn Ross HF Cardiologist: Dr. Zenaida  HPI: Maureen Jackson is a 68 y.o. 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   Her history of heart failure dates back to at least 2018. Echo at that time with EF of 30 to 35%, LHC showed severe multivessel coronary artery disease. Underwent PCI as opposed to CABG with DES to the LAD, x 2 to the RCA and left circumflex disease managed medically.  In 2021 EF dropped to 30% with recovery to 40 to 50% in April 2023. Underwent unsuccessful pericardiocentesis for a large effusion in January 2024 eventually requiring creation of a pericardial window.  Admitted 6/25 with a/c systolic heart failure. Diuresed with IV lasix  and transitioned to po lasix  60 mg daily.  Echo EF 30-35%. She was also treated for DVT RLE tibial vein.   Admitted 10/25 for ADHF. She was diuresed. Echo showed diffuse hypokinesis, EF 30-35%, G1DD, small pericardial effusion, low/nl RV.   She returns today for heart failure follow up. Overall feeling okay, frustrated by swelling. Walking short distances. Edema is also causing leg pain. Unable to walk up steps. Denies chest pain, palpitations, dizziness, and abnormal bleeding. Able to perform ADLs. Appetite okay, daughter reports that she is drinking a lot of soda. Does not take her BP at home. Compliant with all medications. Has not been voiding as much with her lasix  as she usually does.   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 multivessel CAD by cath in 09/2017 and not felt to be a CABG candidate --> underwent two-vessel PCI with DES to the LAD and DES to the RCA   Diabetes mellitus     Diabetes mellitus, type II (HCC)    Dyspnea    Glaucoma    Hypertension    Left bundle branch block    Morbid obesity (HCC)    Obstructive sleep apnea    does not wear CPAP   Thyroid  disease     Current Outpatient Medications  Medication Sig Dispense Refill   acetaminophen  (TYLENOL ) 325 MG tablet Take 2 tablets (650 mg total) by mouth every 4 (four) hours as needed for mild pain, fever or headache. 12 tablet 0   apixaban  (ELIQUIS ) 5 MG TABS tablet Take 1 tablet (5 mg total) by mouth 2 (two) times daily. 60 tablet 1   atorvastatin  (LIPITOR ) 80 MG tablet TAKE 1 TABLET(80 MG) BY MOUTH DAILY 30 tablet 0   Blood Pressure Monitoring (OMRON 3 SERIES BP MONITOR) DEVI Use as directed 1 each 1   calcium  carbonate (TUMS - DOSED IN MG ELEMENTAL CALCIUM ) 500 MG chewable tablet Chew 4 tablets by mouth 2 (two) times daily.     cetirizine  (ZYRTEC ) 10 MG tablet Take 10 mg by mouth daily as needed for allergies.   0   COMBIGAN  0.2-0.5 % ophthalmic solution Place 1 drop into both eyes 2 (two) times daily.     dorzolamide  (TRUSOPT ) 2 % ophthalmic solution Place 1 drop into the left eye 2 (two) times daily. 10 mL 12   furosemide  (LASIX ) 40 MG tablet  Take 1.5 tablets (60 mg total) by mouth daily. 90 tablet 3   JARDIANCE  25 MG TABS tablet Take 25 mg by mouth daily.     levothyroxine  (SYNTHROID ) 200 MCG tablet Take 200 mcg by mouth daily before breakfast. Take 1 tablet with 50mcg for a total dose of 250mcg daily     levothyroxine  (SYNTHROID ) 50 MCG tablet Take 1.5 tablets (75 mcg total) by mouth daily before breakfast. Take 1 tablet with 200mcg for a total dose of 275mcg daily 30 tablet 0   losartan  (COZAAR ) 25 MG tablet Take 1 tablet (25 mg total) by mouth daily. 30 tablet 0   metoprolol  succinate (TOPROL -XL) 25 MG 24 hr tablet TAKE 1 TABLET(25 MG) BY MOUTH DAILY 90 tablet 0   NOVOLOG  FLEXPEN 100 UNIT/ML FlexPen SMARTSIG:20 Unit(s) SUB-Q 3 Times Daily     ofloxacin  (OCUFLOX ) 0.3 % ophthalmic solution Place 1  drop into both eyes 4 (four) times daily.     OZEMPIC, 0.25 OR 0.5 MG/DOSE, 2 MG/3ML SOPN Inject 0.5 mg into the skin once a week. Fridays     senna-docusate (SENOKOT-S) 8.6-50 MG tablet Take 2 tablets by mouth 2 (two) times daily. (Patient taking differently: Take 2 tablets by mouth at bedtime as needed for mild constipation.) 60 tablet 3   VENTOLIN  HFA 108 (90 Base) MCG/ACT inhaler Inhale 1-2 puffs into the lungs every 6 (six) hours as needed for wheezing or shortness of breath. 18 g 1   glucose blood (ONETOUCH VERIO) test strip Test glucose 4 times day. (Patient not taking: Reported on 11/04/2024) 150 each 2   latanoprost  (XALATAN ) 0.005 % ophthalmic solution Place 1 drop into the left eye at bedtime. (Patient not taking: Reported on 11/04/2024) 2.5 mL 12   traMADol  (ULTRAM ) 50 MG tablet Take 1 tablet (50 mg total) by mouth every 6 (six) hours as needed for moderate pain (pain score 4-6) or severe pain (pain score 7-10). (Patient not taking: Reported on 11/04/2024) 10 tablet 0   No current facility-administered medications for this encounter.    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, he passed  Tobacco Use   Smoking status: Former    Current packs/day: 0.00    Types: Cigarettes    Quit date: 12/14/2005    Years since quitting: 18.9    Passive exposure: Never   Smokeless tobacco: Never   Tobacco comments:    QUIT IN 2005  Vaping Use   Vaping status: Never Used  Substance and Sexual Activity   Alcohol   use: No   Drug use: No   Sexual activity: Never  Other Topics Concern   Not on file  Social History Narrative   Not on file   Social Drivers of Health   Financial Resource Strain: Low Risk  (05/19/2024)   Overall Financial Resource Strain (CARDIA)    Difficulty of Paying Living Expenses: Not very hard  Food Insecurity: No Food Insecurity (09/15/2024)  Hunger Vital Sign    Worried About Running Out of Food in the Last Year: Never true    Ran Out of Food in the Last Year: Never true  Transportation Needs: Unmet Transportation Needs (09/20/2024)   PRAPARE - Administrator, Civil Service (Medical): No    Lack of Transportation (Non-Medical): Yes  Physical Activity: Not on file  Stress: Not on file  Social Connections: Unknown (09/15/2024)   Social Connection and Isolation Panel    Frequency of Communication with Friends and Family: Three times a week    Frequency of Social Gatherings with Friends and Family: Once a week    Attends Religious Services: 1 to 4 times per year    Active Member of [employer] or Organizations: No    Attends Banker Meetings: Never    Marital Status: Patient declined  Catering Manager Violence: Not At Risk (09/15/2024)   Humiliation, Afraid, Rape, and Kick questionnaire    Fear of Current or Ex-Partner: No    Emotionally Abused: No    Physically Abused: No    Sexually Abused: No   Family History  Problem Relation Age of Onset   Diabetes Mother    Hypertension Mother    Cancer Mother        pancreas   Hypertension Sister    Wt Readings from Last 3 Encounters:  11/04/24 (!) 152.5 kg (336 lb 3.2 oz)  10/14/24 (!) 151 kg (332 lb 14.3 oz)  09/20/24 (!) 151.7 kg (334 lb 7 oz)   BP 138/84   Pulse 86   Ht 5' 5 (1.651 m)   Wt (!) 152.5 kg (336 lb 3.2 oz)   SpO2 98%   BMI 55.95 kg/m   PHYSICAL EXAM: General: Well appearing. No distress  Cardiac: JVP difficult to assess. No murmurs  Abdomen: Soft, non-distended.  Extremities:  Warm and dry.  2+ BLE edema.  Neuro: A&O x3. Affect pleasant.   DATA REVIEW  ECG: 10/15/24: NSR with LBBB 05/12/23: Normal sinus rhythm with left bundle branch block   ECHO: 10/25: EF 30-35%, G1DD, low-normal RV function 6/25: EF 30-35%.  08/20/23: 30-35%, normal RV function   CATH: 09/11/23:  Severe multivessel CAD with 60% proximal LAD stenosis and 85% mid LAD stenosis; 80, 90 and 80%  OM1 stenosis of the left circumflex with 60% mid AV groove and 50% mid distal AV groove stenosis; dominant RCA with 30% proximal followed by 70% proximal stenosis, 90% mid stenosis, and diffuse mid distal PDA stenosis of at least 80%.   ASSESSMENT & PLAN:  Chronic HFrEF - Mixed NICM/iCM. EF has remained 30-35%, G1DD, most recently 10/25 - NYHA III, functional class confounded by body habitus and OA. - Volume up on exam; increase lasix  to 60 mg bid for 3 days then restart at 80 mg daily; BMET/BNP and repeat in 7-10 days.  - Was taken off GDMT during hospitalization due to hypotension - GDMT  ARB/ARNi: continue entresto  to 97/103 mg bid; stop taking losartan  ? blocker: stopped for hypotension MRA: stopped for hypotension SGLT2i: continue Jardiance  25 mg daily - Advanced therapies: Not a candidate due to obesity and deconditioning  2. Pericardial effusion s/p window - Failed pericardiocentesis; s/p window in 12/2022 - Felt to be secondary to hypothyroidism.  - echo 10/25 with small pericardial effusion  3. CAD - No chest pain - Continue ASA - Continue atorvastatin  80 mg daily  4. CKD 3 - Baseline SCr 1.45-1.9 - Continue SGLT2i - BMET today.  5. Obesity - Continue Ozempic.  - Body mass index is 55.95 kg/m. - Discussed portion control.   6. HTN - BP elevated - GDMT was stopped during admission for symptomatic hypotension - give BP cuff; will start checking daily  7. Iron  Deficiency Anemia - iron  levels checked at last appointment - tsat 8; will refer for IV Fe  8. SDOH - Now  followed by [facility]   Follow up in 2 weeks with APP (volume, restart GDMT)  Brizeida Mcmurry, NP 10:25 AM

## 2024-11-04 NOTE — Addendum Note (Signed)
 Encounter addended by: Washington Geofm NOVAK, RN on: 11/04/2024 11:43 AM  Actions taken: Order list changed, Diagnosis association updated

## 2024-11-04 NOTE — Patient Instructions (Addendum)
 Take Lasix  60 mg twice daily for 3 days then start Lasix  80 mg daily - updated Rx sent. Stop Losartan  - updated Rx sent. Start Entresto  24/26 mg twice daily - Rx sent. Labs today - will call you if abnormal. Rx for BP cuff provided during your visit. Re-sent iron  infusion referral - they will call you to schedule this visit.  Return to Heart Failure APP Clinic in two weeks - see below. Please call us  at [phone] if any questions or concerns prior to your next appointment.

## 2024-11-04 NOTE — Telephone Encounter (Signed)
 Advanced Heart Failure Patient Advocate Encounter  Test billing for this patient's current coverage (AAPR MPD) returns a $0 copay for 90 day supply of Entresto  (DAW 9).  This test claim was processed through [facility]- copay amounts may vary at other pharmacies due to pharmacy/plan contracts, or as the patient moves through the different stages of their insurance plan.  Rachel DEL, CPhT Rx Patient Advocate Phone: [phone]

## 2024-11-05 ENCOUNTER — Telehealth: Payer: Self-pay

## 2024-11-05 ENCOUNTER — Telehealth (HOSPITAL_COMMUNITY): Payer: Self-pay | Admitting: Cardiology

## 2024-11-05 NOTE — Telephone Encounter (Signed)
 Auth Submission: NO AUTH NEEDED Site of care: Site of care: [facility] Payer: [employer] medic[employer] dual complete Medication & CPT/J Code(s) submitted: Feraheme  (ferumoxytol ) [id number] Diagnosis Code:  Route of submission (phone, fax, portal): portal Phone # Fax # Auth type: Buy/Bill PB Units/visits requested: 510mg  x 2 doses Reference number: [id number] Approval from: 11/05/24 to 12/10/24

## 2024-11-05 NOTE — Telephone Encounter (Signed)
 Patient referred to infusion pharmacy team for ambulatory infusion of IV iron .  Insurance - [employer]  Site of care - Site of care: [facility] Dx code - I50.22/D50.8i  IV Iron  Therapy - Feraheme  510 mg IV x 2  Infusion appointments - Scheduling team will schedule patient as soon as possible.    Yassen Kinnett D. Calliope Delangel, PharmD

## 2024-11-18 ENCOUNTER — Ambulatory Visit (HOSPITAL_COMMUNITY)

## 2024-11-20 ENCOUNTER — Other Ambulatory Visit (HOSPITAL_COMMUNITY): Payer: Self-pay | Admitting: *Deleted

## 2024-11-20 DIAGNOSIS — I5022 Chronic systolic (congestive) heart failure: Secondary | ICD-10-CM

## 2024-11-20 MED ORDER — FUROSEMIDE 40 MG PO TABS: 80.0000 mg | ORAL_TABLET | Freq: Every day | ORAL | 3 refills | Status: DC

## 2024-11-26 ENCOUNTER — Telehealth (HOSPITAL_COMMUNITY): Payer: Self-pay

## 2024-11-26 NOTE — Telephone Encounter (Signed)
 Called to confirm/remind patient of their appointment at the Advanced Heart Failure Clinic on 11/26/24.   Appointment:   [] Confirmed  [x] Left mess   [] No answer/No voice mail  [] VM Full/unable to leave message  [] Phone not in service  And to bring in all medications and/or complete list.

## 2024-11-27 ENCOUNTER — Ambulatory Visit (HOSPITAL_COMMUNITY)

## 2024-11-27 NOTE — Progress Notes (Incomplete)
 ADVANCED HEART FAILURE CLINIC NOTE  Primary Care: Orpha Yancey LABOR, MD Primary Cardiologist: Dr. Dorn Ross HF Cardiologist: Dr. Zenaida  HPI: Maureen Jackson is a 68 y.o. 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   Her history of heart failure dates back to at least 2018. Echo at that time with EF of 30 to 35%, LHC showed severe multivessel coronary artery disease. Underwent PCI as opposed to CABG with DES to the LAD, x 2 to the RCA and left circumflex disease managed medically.  In 2021 EF dropped to 30% with recovery to 40 to 50% in April 2023. Underwent unsuccessful pericardiocentesis for a large effusion in January 2024 eventually requiring creation of a pericardial window.  Admitted 6/25 with a/c systolic heart failure. Diuresed with IV lasix  and transitioned to po lasix  60 mg daily.  Echo EF 30-35%. She was also treated for DVT RLE tibial vein.   Admitted 10/25 for ADHF. She was diuresed. Echo showed diffuse hypokinesis, EF 30-35%, G1DD, small pericardial effusion, low/nl RV.   She returns today for heart failure follow up. Overall feeling okay, frustrated by swelling. Walking short distances. Edema is also causing leg pain. Unable to walk up steps. Denies chest pain, palpitations, dizziness, and abnormal bleeding. Able to perform ADLs. Appetite okay, daughter reports that she is drinking a lot of soda. Does not take her BP at home. Compliant with all medications. Has not been voiding as much with her lasix  as she usually does.   She returns today for HF follow up. Overall feeling ***. NYHA ***. Reports {Symptoms; cardiac:12860::dyspnea,fatigue}. Denies {Symptoms; cardiac:12860::chest pain,dyspnea,fatigue,near-syncope,orthopnea,palpitations,dizziness,abnormal bleeding}. Able to perform ADLs. Appetite okay. Weight at home ***. BP at home***. Compliant with all medications.   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 multivessel CAD by cath in 09/2017 and not felt to be a CABG candidate --> underwent two-vessel PCI with DES to the LAD and DES to the RCA   Diabetes mellitus    Diabetes mellitus, type II (HCC)    Dyspnea    Glaucoma    Hypertension    Left bundle branch block    Morbid obesity (HCC)    Obstructive sleep apnea    does not wear CPAP   Thyroid  disease     Current Outpatient Medications  Medication Sig Dispense Refill   acetaminophen  (TYLENOL ) 325 MG tablet Take 2 tablets (650 mg total) by mouth every 4 (four) hours as needed for mild pain, fever or headache. 12 tablet 0   apixaban  (ELIQUIS ) 5 MG TABS tablet Take 1 tablet (5 mg total) by mouth 2 (two) times daily. 60 tablet 1   atorvastatin  (LIPITOR ) 80 MG tablet TAKE 1 TABLET(80 MG) BY MOUTH DAILY 30 tablet 0   Blood Pressure Monitoring (OMRON 3 SERIES BP MONITOR) DEVI Use as directed 1 each 1   calcium  carbonate (TUMS - DOSED IN MG ELEMENTAL CALCIUM ) 500 MG chewable tablet Chew 4 tablets by mouth 2 (two) times daily.     cetirizine  (ZYRTEC ) 10 MG tablet Take 10 mg by mouth daily as needed for allergies.   0   COMBIGAN  0.2-0.5 % ophthalmic solution Place 1  drop into both eyes 2 (two) times daily.     dorzolamide  (TRUSOPT ) 2 % ophthalmic solution Place 1 drop into the left eye 2 (two) times daily. 10 mL 12   ENTRESTO  24-26 MG Take 1 tablet by mouth 2 (two) times daily. 180 tablet 3   furosemide  (LASIX ) 40 MG tablet Take 2 tablets (80 mg total) by mouth daily. May take extra as directed by Heart Failure Clinic. 200 tablet 3   glucose blood (ONETOUCH VERIO) test strip Test glucose 4 times day. (Patient not taking: Reported on 11/04/2024) 150 each 2   JARDIANCE  25 MG TABS tablet Take 25 mg by mouth daily.     latanoprost  (XALATAN )  0.005 % ophthalmic solution Place 1 drop into the left eye at bedtime. (Patient not taking: Reported on 11/04/2024) 2.5 mL 12   levothyroxine  (SYNTHROID ) 200 MCG tablet Take 200 mcg by mouth daily before breakfast. Take 1 tablet with 50mcg for a total dose of 250mcg daily     levothyroxine  (SYNTHROID ) 50 MCG tablet Take 1.5 tablets (75 mcg total) by mouth daily before breakfast. Take 1 tablet with 200mcg for a total dose of 275mcg daily 30 tablet 0   metoprolol  succinate (TOPROL -XL) 25 MG 24 hr tablet TAKE 1 TABLET(25 MG) BY MOUTH DAILY (Patient not taking: Reported on 11/04/2024) 90 tablet 0   NOVOLOG  FLEXPEN 100 UNIT/ML FlexPen SMARTSIG:20 Unit(s) SUB-Q 3 Times Daily     ofloxacin  (OCUFLOX ) 0.3 % ophthalmic solution Place 1 drop into both eyes 4 (four) times daily.     OZEMPIC, 0.25 OR 0.5 MG/DOSE, 2 MG/3ML SOPN Inject 0.5 mg into the skin once a week. Fridays     senna-docusate (SENOKOT-S) 8.6-50 MG tablet Take 2 tablets by mouth 2 (two) times daily. (Patient taking differently: Take 2 tablets by mouth at bedtime as needed for mild constipation.) 60 tablet 3   traMADol  (ULTRAM ) 50 MG tablet Take 1 tablet (50 mg total) by mouth every 6 (six) hours as needed for moderate pain (pain score 4-6) or severe pain (pain score 7-10). (Patient not taking: Reported on 11/04/2024) 10 tablet 0   VENTOLIN  HFA 108 (90 Base) MCG/ACT inhaler Inhale 1-2 puffs into the lungs every 6 (six) hours as needed for wheezing or shortness of breath. 18 g 1   No current facility-administered medications for this visit.    Allergies  Allergen Reactions   Ampicillin  Other (See Comments)    Allergic, per MAR   Aspirin  Nausea Only   Hydrocodone  Other (See Comments)    Allergic, per MAR   Unasyn  [Ampicillin -Sulbactam Sodium ] Rash and Other (See Comments)    Allergic, per [facility]    Social History   Socioeconomic History   Marital status: Widowed    Spouse name: Not on file   Number of children: 1   Years of  education: Not on file   Highest education level: 11th grade  Occupational History   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, he passed  Tobacco Use   Smoking status: Former    Current packs/day: 0.00    Types: Cigarettes    Quit date: 12/14/2005    Years since quitting: 18.9    Passive exposure: Never   Smokeless tobacco: Never   Tobacco comments:    QUIT IN 2005  Vaping Use   Vaping status: Never Used  Substance and Sexual Activity   Alcohol  use: No   Drug use: No   Sexual activity: Never  Other Topics Concern   Not on  file  Social History Narrative   Not on file   Social Drivers of Health   Tobacco Use: Medium Risk (11/04/2024)   Patient History    Smoking Tobacco Use: Former    Smokeless Tobacco Use: Never    Passive Exposure: Never  Physicist, Medical Strain: Low Risk (05/19/2024)   Overall Financial Resource Strain (CARDIA)    Difficulty of Paying Living Expenses: Not very hard  Food Insecurity: No Food Insecurity (09/15/2024)   Epic    Worried About Programme Researcher, Broadcasting/film/video in the Last Year: Never true    Ran Out of Food in the Last Year: Never true  Transportation Needs: Unmet Transportation Needs (09/20/2024)   Epic    Lack of Transportation (Medical): No    Lack of Transportation (Non-Medical): Yes  Physical Activity: Not on file  Stress: Not on file  Social Connections: Unknown (09/15/2024)   Social Connection and Isolation Panel    Frequency of Communication with Friends and Family: Three times a week    Frequency of Social Gatherings with Friends and Family: Once a week    Attends Religious Services: 1 to 4 times per year    Active Member of [employer] or Organizations: No    Attends Banker Meetings: Never    Marital Status: Patient declined  Catering Manager Violence: Not At Risk (09/15/2024)   Epic    Fear of Current or Ex-Partner: No    Emotionally Abused: No    Physically Abused: No    Sexually Abused: No  Depression (PHQ2-9): Not on file   Alcohol  Screen: Low Risk (12/22/2022)   Alcohol  Screen    Last Alcohol  Screening Score (AUDIT): 0  Housing: Low Risk (09/15/2024)   Epic    Unable to Pay for Housing in the Last Year: No    Number of Times Moved in the Last Year: 0    Homeless in the Last Year: No  Utilities: Not At Risk (09/15/2024)   Epic    Threatened with loss of utilities: No  Health Literacy: Not on file   Family History  Problem Relation Age of Onset   Diabetes Mother    Hypertension Mother    Cancer Mother        pancreas   Hypertension Sister    Wt Readings from Last 3 Encounters:  11/04/24 (!) 152.5 kg (336 lb 3.2 oz)  10/14/24 (!) 151 kg (332 lb 14.3 oz)  09/20/24 (!) 151.7 kg (334 lb 7 oz)   There were no vitals taken for this visit.  PHYSICAL EXAM: General: Well appearing. No distress  Cardiac: JVP difficult to assess. No murmurs  Abdomen: Soft, non-distended.  Extremities: Warm and dry.  2+ BLE edema.  Neuro: A&O x3. Affect pleasant.   DATA REVIEW  ECG: 10/15/24: NSR with LBBB 05/12/23: Normal sinus rhythm with left bundle branch block   ECHO: 10/25: EF 30-35%, G1DD, low-normal RV function 6/25: EF 30-35%.  08/20/23: 30-35%, normal RV function   CATH: 09/11/23:  Severe multivessel CAD with 60% proximal LAD stenosis and 85% mid LAD stenosis; 80, 90 and 80%  OM1 stenosis of the left circumflex with 60% mid AV groove and 50% mid distal AV groove stenosis; dominant RCA with 30% proximal followed by 70% proximal stenosis, 90% mid stenosis, and diffuse mid distal PDA stenosis of at least 80%.   ASSESSMENT & PLAN:  Chronic HFrEF - Mixed NICM/iCM. EF has remained 30-35%, G1DD, most recently 10/25 - NYHA III, functional class confounded by  body habitus and OA. - Volume up on exam; increase lasix  to 60 mg bid for 3 days then restart at 80 mg daily; BMET/BNP and repeat in 7-10 days.  - Was taken off GDMT during hospitalization due to hypotension - GDMT  ARB/ARNi: continue entresto  to 97/103 mg  bid; stop taking losartan  ? blocker: stopped for hypotension MRA: stopped for hypotension SGLT2i: continue Jardiance  25 mg daily - Advanced therapies: Not a candidate due to obesity and deconditioning  2. Pericardial effusion s/p window - Failed pericardiocentesis; s/p window in 12/2022 - Felt to be secondary to hypothyroidism.  - echo 10/25 with small pericardial effusion  3. CAD - No chest pain - Continue ASA - Continue atorvastatin  80 mg daily  4. CKD 3 - Baseline SCr 1.45-1.9 - Continue SGLT2i - BMET today.  5. Obesity - Continue Ozempic.  - There is no height or weight on file to calculate BMI. - Discussed portion control.   6. HTN - BP elevated - GDMT was stopped during admission for symptomatic hypotension - give BP cuff; will start checking daily  7. Iron  Deficiency Anemia - iron  levels checked at last appointment - tsat 8; will refer for IV Fe  8. SDOH - Now followed by [facility]   Follow up in 2 weeks with APP (volume, restart GDMT)  Tomislav Micale, NP 6:42 AM

## 2024-12-01 ENCOUNTER — Telehealth: Payer: Self-pay | Admitting: Nurse Practitioner

## 2024-12-01 DIAGNOSIS — I5022 Chronic systolic (congestive) heart failure: Secondary | ICD-10-CM

## 2024-12-01 NOTE — Telephone Encounter (Signed)
" °*  STAT* If patient is at the pharmacy, call can be transferred to refill team.   1. Which medications need to be refilled? (please list name of each medication and dose if known)  ENTRESTO  24-26 MG  metoprolol  succinate (TOPROL -XL) 25 MG 24 hr tablet   2. Would you like to learn more about the convenience, safety, & potential cost savings by using the [facility]? no    3. Are you open to using the [facility] (Type [facility]. no   4. Which pharmacy/location (including street and city if local pharmacy) is medication to be sent to?  [id number] - ,  - [address]     5. Do they need a 30 day or 90 day supply? 90 days   Pt out of meds "

## 2024-12-03 MED ORDER — METOPROLOL SUCCINATE ER 25 MG PO TB24: 25.0000 mg | ORAL_TABLET | Freq: Every day | ORAL | 3 refills | Status: DC

## 2024-12-03 MED ORDER — ENTRESTO 24-26 MG PO TABS: 1.0000 | ORAL_TABLET | Freq: Two times a day (BID) | ORAL | 3 refills | Status: DC

## 2024-12-30 ENCOUNTER — Emergency Department (HOSPITAL_COMMUNITY)

## 2024-12-30 ENCOUNTER — Observation Stay (HOSPITAL_COMMUNITY)
Admission: EM | Admit: 2024-12-30 | Discharge: 2024-12-31 | Disposition: A | Discharge status: Home / Self Care | Attending: Family Medicine | Admitting: Family Medicine

## 2024-12-30 ENCOUNTER — Other Ambulatory Visit: Payer: Self-pay

## 2024-12-30 ENCOUNTER — Encounter (HOSPITAL_COMMUNITY): Payer: Self-pay | Admitting: *Deleted

## 2024-12-30 DIAGNOSIS — I739 Peripheral vascular disease, unspecified: Secondary | ICD-10-CM | POA: Diagnosis not present

## 2024-12-30 DIAGNOSIS — R0602 Shortness of breath: Secondary | ICD-10-CM | POA: Diagnosis present

## 2024-12-30 DIAGNOSIS — R2689 Other abnormalities of gait and mobility: Secondary | ICD-10-CM | POA: Insufficient documentation

## 2024-12-30 DIAGNOSIS — R262 Difficulty in walking, not elsewhere classified: Secondary | ICD-10-CM | POA: Insufficient documentation

## 2024-12-30 DIAGNOSIS — E039 Hypothyroidism, unspecified: Secondary | ICD-10-CM | POA: Diagnosis not present

## 2024-12-30 DIAGNOSIS — Z7901 Long term (current) use of anticoagulants: Secondary | ICD-10-CM | POA: Insufficient documentation

## 2024-12-30 DIAGNOSIS — N1832 Chronic kidney disease, stage 3b: Secondary | ICD-10-CM | POA: Insufficient documentation

## 2024-12-30 DIAGNOSIS — Z79899 Other long term (current) drug therapy: Secondary | ICD-10-CM | POA: Insufficient documentation

## 2024-12-30 DIAGNOSIS — Z794 Long term (current) use of insulin: Secondary | ICD-10-CM | POA: Insufficient documentation

## 2024-12-30 DIAGNOSIS — M6281 Muscle weakness (generalized): Secondary | ICD-10-CM | POA: Diagnosis not present

## 2024-12-30 DIAGNOSIS — I130 Hypertensive heart and chronic kidney disease with heart failure and stage 1 through stage 4 chronic kidney disease, or unspecified chronic kidney disease: Secondary | ICD-10-CM | POA: Insufficient documentation

## 2024-12-30 DIAGNOSIS — E1122 Type 2 diabetes mellitus with diabetic chronic kidney disease: Secondary | ICD-10-CM | POA: Insufficient documentation

## 2024-12-30 DIAGNOSIS — I2510 Atherosclerotic heart disease of native coronary artery without angina pectoris: Secondary | ICD-10-CM | POA: Insufficient documentation

## 2024-12-30 DIAGNOSIS — R531 Weakness: Secondary | ICD-10-CM | POA: Insufficient documentation

## 2024-12-30 DIAGNOSIS — I447 Left bundle-branch block, unspecified: Secondary | ICD-10-CM | POA: Insufficient documentation

## 2024-12-30 DIAGNOSIS — E785 Hyperlipidemia, unspecified: Secondary | ICD-10-CM | POA: Insufficient documentation

## 2024-12-30 DIAGNOSIS — I255 Ischemic cardiomyopathy: Secondary | ICD-10-CM | POA: Diagnosis not present

## 2024-12-30 DIAGNOSIS — Z6841 Body Mass Index (BMI) 40.0 and over, adult: Secondary | ICD-10-CM | POA: Insufficient documentation

## 2024-12-30 DIAGNOSIS — I82531 Chronic embolism and thrombosis of right popliteal vein: Secondary | ICD-10-CM | POA: Diagnosis not present

## 2024-12-30 DIAGNOSIS — I509 Heart failure, unspecified: Principal | ICD-10-CM

## 2024-12-30 DIAGNOSIS — E1165 Type 2 diabetes mellitus with hyperglycemia: Secondary | ICD-10-CM | POA: Insufficient documentation

## 2024-12-30 DIAGNOSIS — Z955 Presence of coronary angioplasty implant and graft: Secondary | ICD-10-CM | POA: Diagnosis not present

## 2024-12-30 DIAGNOSIS — D631 Anemia in chronic kidney disease: Secondary | ICD-10-CM | POA: Insufficient documentation

## 2024-12-30 DIAGNOSIS — G4733 Obstructive sleep apnea (adult) (pediatric): Secondary | ICD-10-CM | POA: Diagnosis not present

## 2024-12-30 DIAGNOSIS — R7989 Other specified abnormal findings of blood chemistry: Secondary | ICD-10-CM | POA: Insufficient documentation

## 2024-12-30 DIAGNOSIS — I5023 Acute on chronic systolic (congestive) heart failure: Secondary | ICD-10-CM | POA: Diagnosis not present

## 2024-12-30 DIAGNOSIS — Z87891 Personal history of nicotine dependence: Secondary | ICD-10-CM | POA: Insufficient documentation

## 2024-12-30 DIAGNOSIS — I5022 Chronic systolic (congestive) heart failure: Secondary | ICD-10-CM

## 2024-12-30 LAB — CBC WITH DIFFERENTIAL/PLATELET
Abs Immature Granulocytes: 0.03 K/uL (ref 0.00–0.07)
Basophils Absolute: 0 K/uL (ref 0.0–0.1)
Basophils Relative: 0 %
Eosinophils Absolute: 0.3 K/uL (ref 0.0–0.5)
Eosinophils Relative: 5 %
HCT: 31.8 % — ABNORMAL LOW (ref 36.0–46.0)
Hemoglobin: 9.1 g/dL — ABNORMAL LOW (ref 12.0–15.0)
Immature Granulocytes: 1 %
Lymphocytes Relative: 32 %
Lymphs Abs: 1.9 K/uL (ref 0.7–4.0)
MCH: 24.9 pg — ABNORMAL LOW (ref 26.0–34.0)
MCHC: 28.6 g/dL — ABNORMAL LOW (ref 30.0–36.0)
MCV: 86.9 fL (ref 80.0–100.0)
Monocytes Absolute: 0.4 K/uL (ref 0.1–1.0)
Monocytes Relative: 7 %
Neutro Abs: 3.4 K/uL (ref 1.7–7.7)
Neutrophils Relative %: 55 %
Platelets: 239 K/uL (ref 150–400)
RBC: 3.66 MIL/uL — ABNORMAL LOW (ref 3.87–5.11)
RDW: 17.2 % — ABNORMAL HIGH (ref 11.5–15.5)
WBC: 6 K/uL (ref 4.0–10.5)
nRBC: 0 % (ref 0.0–0.2)

## 2024-12-30 LAB — BASIC METABOLIC PANEL WITH GFR
Anion gap: 13 (ref 5–15)
BUN: 35 mg/dL — ABNORMAL HIGH (ref 8–23)
CO2: 22 mmol/L (ref 22–32)
Calcium: 7.2 mg/dL — ABNORMAL LOW (ref 8.9–10.3)
Chloride: 112 mmol/L — ABNORMAL HIGH (ref 98–111)
Creatinine, Ser: 1.64 mg/dL — ABNORMAL HIGH (ref 0.44–1.00)
GFR, Estimated: 34 mL/min — ABNORMAL LOW
Glucose, Bld: 159 mg/dL — ABNORMAL HIGH (ref 70–99)
Potassium: 4.4 mmol/L (ref 3.5–5.1)
Sodium: 147 mmol/L — ABNORMAL HIGH (ref 135–145)

## 2024-12-30 LAB — PRO BRAIN NATRIURETIC PEPTIDE: Pro Brain Natriuretic Peptide: 2009 pg/mL — ABNORMAL HIGH

## 2024-12-30 LAB — RESP PANEL BY RT-PCR (RSV, FLU A&B, COVID)  RVPGX2
Influenza A by PCR: NEGATIVE
Influenza B by PCR: NEGATIVE
Resp Syncytial Virus by PCR: NEGATIVE
SARS Coronavirus 2 by RT PCR: NEGATIVE

## 2024-12-30 LAB — TROPONIN T, HIGH SENSITIVITY
Troponin T High Sensitivity: 75 ng/L — ABNORMAL HIGH (ref 0–19)
Troponin T High Sensitivity: 75 ng/L — ABNORMAL HIGH (ref 0–19)

## 2024-12-30 LAB — GLUCOSE, CAPILLARY: Glucose-Capillary: 112 mg/dL — ABNORMAL HIGH (ref 70–99)

## 2024-12-30 LAB — TSH: TSH: 117 u[IU]/mL — ABNORMAL HIGH (ref 0.350–4.500)

## 2024-12-30 MED ORDER — FUROSEMIDE 10 MG/ML IJ SOLN
40.0000 mg | Freq: Once | INTRAMUSCULAR | Status: AC
  Administered 2024-12-30: 40 mg via INTRAVENOUS
  Filled 2024-12-30: qty 4

## 2024-12-30 MED ORDER — INSULIN ASPART 100 UNIT/ML IJ SOLN
0.0000 [IU] | Freq: Three times a day (TID) | INTRAMUSCULAR | Status: DC
  Administered 2024-12-31: 2 [IU] via SUBCUTANEOUS
  Administered 2024-12-31: 0 [IU] via SUBCUTANEOUS
  Filled 2024-12-30 (×2): qty 1

## 2024-12-30 MED ORDER — INSULIN ASPART 100 UNIT/ML IJ SOLN: 0.0000 [IU] | Freq: Every day | INTRAMUSCULAR | Status: DC

## 2024-12-30 MED ORDER — FUROSEMIDE 10 MG/ML IJ SOLN: 20.0000 mg | Freq: Two times a day (BID) | INTRAMUSCULAR | Status: DC

## 2024-12-30 MED ORDER — APIXABAN 5 MG PO TABS
5.0000 mg | ORAL_TABLET | Freq: Two times a day (BID) | ORAL | Status: DC
  Administered 2024-12-30 – 2024-12-31 (×2): 5 mg via ORAL
  Filled 2024-12-30 (×2): qty 1

## 2024-12-30 NOTE — ED Notes (Signed)
 Ambulated pt in hallway sp02 was in the 80's. Pt complained of sob.

## 2024-12-30 NOTE — H&P (Incomplete)
 " History and Physical  Maureen Jackson FMW:[id number] DOB: 1956/05/04 DOA: 12/30/2024  Referring physician: Dr. Dean, EDP  PCP: Orpha Yancey LABOR, MD  Outpatient Specialists: Cardiology Patient coming from: Home.  Chief Complaint: Shortness of breath.  HPI: Maureen Jackson is a 69 y.o. 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  Endorses not taking her home Lasix  for a week because she thought it was causing her blood sugar to drop.  She started eating ice cream before bed to increase her sugar level in the morning.  No reported subjective fevers or chills.  In the ER, volume overload on exam and tachypneic.  proBNP greater than 2000 high-sensitivity troponin 75, 75.  Chest x-ray showing cardiomegaly and pulmonary edema.  The patient received 1 dose of IV Lasix  40 mg x 1.  TRH, hospitalist service, was asked to admit.  ED Course: Temperature 97.9.  BP 130/92, pulse 74, respiratory rate 13, O2 saturation 100% on room air.  Review of Systems: Review of systems as noted in the HPI. All other systems reviewed and are negative.   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 multivessel CAD by cath in 09/2017 and not felt to be a CABG candidate --> underwent two-vessel PCI with DES to the LAD and DES to the RCA   Diabetes  mellitus    Diabetes mellitus, type II (HCC)    Dyspnea    Glaucoma    Hypertension    Left bundle branch block    Morbid obesity (HCC)    Obstructive sleep apnea    does not wear CPAP   Thyroid  disease    Past Surgical History:  Procedure Laterality Date   ABDOMINAL HYSTERECTOMY     CARDIAC CATHETERIZATION  09/12/2017   CORONARY STENT INTERVENTION  09/12/2017   STENT RESOLUTE ONYX 7.24K61 drug eluting stent was successfully placed   CORONARY STENT INTERVENTION N/A 09/12/2017   Procedure: CORONARY STENT INTERVENTION;  Surgeon: Anner Alm ORN, MD;  Location: [facility] INVASIVE CV LAB;  Service: Cardiovascular;  Laterality: N/A;   INCISION AND DRAINAGE PERIRECTAL ABSCESS Left 09/16/2019   Procedure: IRRIGATION AND DEBRIDEMENT LABIA ABSCESS;  Surgeon: Vernetta Berg, MD;  Location: [facility] OR;  Service: General;  Laterality: Left;   IR FLUORO GUIDE CV LINE RIGHT  09/16/2019   IR US  GUIDE VASC ACCESS RIGHT  09/16/2019   LEFT HEART CATH AND CORONARY ANGIOGRAPHY N/A 09/12/2017   Procedure: LEFT HEART CATH AND CORONARY ANGIOGRAPHY;  Surgeon: Anner Alm ORN, MD;  Location: [facility] INVASIVE CV LAB;  Service: Cardiovascular;  Laterality: N/A;   MASS EXCISION N/A 12/18/2018   Procedure: EXCISION TONGUE MASS;  Surgeon: Karis Clunes, MD;  Location: [facility] OR;  Service: ENT;  Laterality: N/A;   PERICARDIOCENTESIS N/A 12/22/2022   Procedure: PERICARDIOCENTESIS;  Surgeon: Wendel Lurena POUR, MD;  Location: [facility];  Service: Cardiovascular;  Laterality: N/A;   RIGHT/LEFT HEART CATH AND CORONARY ANGIOGRAPHY N/A 09/10/2017   Procedure: RIGHT/LEFT HEART CATH AND CORONARY ANGIOGRAPHY;  Surgeon: Burnard Debby LABOR, MD;  Location: [facility] INVAS[facility] CV [facility];  Service: Cardiovascular;  Laterality: N/A;   SUBXYPHOID PERICARDIAL WINDOW N/A 12/23/2022   Procedure: SUBXYPHOID PERICARDIAL WINDOW;  Surgeon: Maryjane Mt, MD;  Location: [facility] OR;  Service: Thoracic;  Laterality: N/A;   TEE WITHOUT CARDIOVERSION N/A 12/23/2022    Procedure: TRANSESOPHAGEAL ECHOCARDIOGRAM (TEE);  Surgeon: Maryjane Mt, MD;  Location: [facility] OR;  Service: Thoracic;  Laterality: N/A;   THYROID  SURGERY      Social History:  reports that she quit smoking about 19 years ago. Her smoking use included cigarettes. She has never been exposed to tobacco smoke. She has never used smokeless tobacco. She reports that she does not drink alcohol  and does not use drugs.   Allergies[1]  Family History  Problem Relation Age of Onset   Diabetes Mother    Hypertension Mother    Cancer Mother        pancreas   Hypertension Sister       Prior to Admission medications  Medication Sig Start Date End Date Taking? Authorizing Provider  acetaminophen  (TYLENOL ) 325 MG tablet Take 2 tablets (650 mg total) by mouth every 4 (four) hours as needed for mild pain, fever or headache. 05/25/20   Pearlean Manus, MD  apixaban  (ELIQUIS ) 5 MG TABS tablet Take 1 tablet (5 mg total) by mouth 2 (two) times daily. 05/25/24   Evonnie Lenis, MD  atorvastatin  (LIPITOR ) 80 MG tablet TAKE 1 TABLET(80 MG) BY MOUTH DAILY 09/15/24   Alvan Dorn FALCON, MD  Blood Pressure Monitoring (OMRON 3 SERIES BP MONITOR) DEVI Use as directed 08/27/23   Miriam Norris, NP  calcium  carbonate (TUMS - DOSED IN MG ELEMENTAL CALCIUM ) 500 MG chewable tablet Chew 4 tablets by mouth 2 (two) times daily.    [provider]  cetirizine  (ZYRTEC ) 10 MG tablet Take 10 mg by mouth daily as needed for allergies.  09/12/18   [provider]  COMBIGAN  0.2-0.5 % ophthalmic solution Place 1 drop into both eyes 2 (two) times daily. 02/08/22   [provider]  dorzolamide  (TRUSOPT ) 2 % ophthalmic solution Place 1 drop into the left eye 2 (two) times daily. 10/16/24   Franklyn Sid SAILOR, MD  ENTRESTO  24-26 MG Take 1 tablet by mouth 2 (two) times daily. 12/03/24   Zenaida Morene PARAS, MD  furosemide  (LASIX ) 40 MG tablet Take 2 tablets (80 mg total) by mouth daily. May take extra as directed by [facility]. 11/20/24   Lee, Jordan, NP  glucose blood (ONETOUCH VERIO) test strip Test glucose 4 times day. Patient not taking: Reported on 11/04/2024 04/19/22   Lenis Ethelle ORN, MD  JARDIANCE  25 MG TABS tablet Take 25 mg by mouth daily. 07/21/24   [provider]  latanoprost  (XALATAN ) 0.005 % ophthalmic solution Place 1 drop into the left eye at bedtime. Patient not taking: Reported on 11/04/2024 10/16/24   Franklyn Sid SAILOR, MD  levothyroxine  (SYNTHROID ) 200 MCG tablet Take 200 mcg by mouth daily before breakfast. Take 1 tablet with 50mcg for a total dose of 250mcg daily    [provider]  levothyroxine  (SYNTHROID ) 50 MCG tablet Take 1.5 tablets (75 mcg total) by mouth daily before breakfast. Take 1 tablet with 200mcg for a total dose of 275mcg daily 09/20/24   Bryn Bernardino NOVAK, MD  metoprolol  succinate (TOPROL -XL)  25 MG 24 hr tablet Take 1 tablet (25 mg total) by mouth daily. 12/03/24   Zenaida Morene PARAS, MD  NOVOLOG  FLEXPEN 100 UNIT/ML FlexPen SMARTSIG:20 Unit(s) SUB-Q 3 Times Daily 07/14/24   [provider]  ofloxacin  (OCUFLOX ) 0.3 % ophthalmic solution Place 1 drop into both eyes 4 (four) times daily. 09/20/24   Bryn Bernardino NOVAK, MD  OZEMPIC, 0.25 OR 0.5 MG/DOSE, 2 MG/3ML SOPN Inject 0.5 mg into the skin once a week. Fridays 05/03/23   [provider]  senna-docusate (SENOKOT-S) 8.6-50 MG tablet Take 2 tablets by mouth 2 (two) times daily. Patient taking differently: Take 2 tablets by mouth at bedtime as needed for mild constipation. 05/25/20   Pearlean Manus, MD  traMADol  (ULTRAM ) 50 MG tablet Take 1 tablet (50 mg total) by mouth every 6 (six) hours as needed for moderate pain (pain score 4-6) or severe pain (pain score 7-10). Patient not taking: Reported on 11/04/2024 09/20/24   Bryn Bernardino NOVAK, MD  VENTOLIN  HFA 108 [phone] Base) MCG/ACT inhaler Inhale 1-2 puffs into the lungs every 6 (six) hours as needed for wheezing or shortness of breath. 05/25/20   Pearlean Manus, MD    Physical Exam: BP (!) 130/92   Pulse 71   Temp 97.9 F (36.6 C) (Oral)   Resp 17   Ht 5' 5 (1.651 m)   Wt (!) 153.3 kg   SpO2 99%   BMI 56.25 kg/m   General: 69 y.o. year-old female well developed well nourished in no acute distress.  Alert and oriented x3. Cardiovascular: Regular rate and rhythm with no rubs or gallops.  No thyromegaly or JVD noted.  2+ pitting edema in lower extremities bilaterally. Respiratory: Faint diffuse rales bilaterally.  Poor inspiratory effort. Abdomen: Soft nontender nondistended with normal bowel sounds x4 quadrants. Muskuloskeletal: No cyanosis or clubbing noted bilaterally Neuro: CN II-XII intact, strength, sensation, reflexes Skin: No ulcerative lesions noted or rashes Psychiatry: Judgement and insight appear normal. Mood is appropriate for condition and setting          Labs on Admission:  Basic Metabolic Panel: Recent Labs  Lab 12/30/24 1530  NA 147*  K 4.4  CL 112*  CO2 22  GLUCOSE 159*  BUN 35*  CREATININE 1.64*  CALCIUM  7.2*   Liver Function Tests: No results for input(s): AST, ALT, ALKPHOS, BILITOT, PROT, ALBUMIN  in the last 168 hours. No results for input(s): LIPASE, AMYLASE in the last 168 hours. No results for input(s): AMMONIA in the last 168 hours. CBC: Recent Labs  Lab 12/30/24 1530  WBC 6.0  NEUTROABS 3.4  HGB 9.1*  HCT 31.8*  MCV 86.9  PLT 239   Cardiac Enzymes: No results for input(s): CKTOTAL, CKMB, CKMBINDEX, TROPONINI in the last 168 hours.  BNP (last 3 results) Recent Labs    08/01/24 1156 10/14/24 1945 11/04/24 1110  BNP 256.1* 119.9* 43.3    ProBNP (last 3 results) Recent Labs    09/14/24 1630 12/30/24 1530  PROBNP 1,543.0* 2,009.0*    CBG: No results for input(s): GLUCAP in the last 168 hours.  Radiological Exams on Admission: DG Chest Portable 1 View Result Date: 12/30/2024 EXAM: 1 VIEW(S) XRAY OF THE CHEST 12/30/2024 03:39:00 PM  COMPARISON: 10/14/2024 CLINICAL HISTORY: SOB FINDINGS: LUNGS AND PLEURA: Mild diffuse hazy and interstitial opacities. No pleural effusion. No pneumothorax. HEART AND MEDIASTINUM: Stable cardiomegaly. No acute abnormality of the mediastinal silhouette. BONES AND SOFT TISSUES: Surgical clips at thoracic inlet. No acute osseous abnormality. IMPRESSION: 1.  Mild diffuse hazy and interstitial opacities worrisome for mild edema . 2. Stable cardiomegaly. Electronically signed by: Greig Pique MD 12/30/2024 03:43 PM EST RP Workstation: [id number]    EKG: I independently viewed the EKG done and my findings are as followed: Chronic LBBB, sinus rhythm rate of 73, QTc 556.  Assessment/Plan Present on Admission:  Acute on chronic systolic (congestive) heart failure (HCC)  Principal Problem:   Acute on chronic systolic (congestive) heart failure (HCC)  Acute on chronic combined diastolic and systolic CHF secondary to diuretics and diet noncompliance. Presented with proBNP greater than 2000, volume overload on exam, chest x-ray with pulmonary edema. Last 2D echo done on 09/15/2024 revealed LVEF 30 to 35% with grade 1 diastolic dysfunction. IV diuresis initiated in the ER, continue. Monitor strict I's and O's and daily weight Replace electrolytes as indicated. Follow repeat transthoracic echocardiogram  Elevated troponin, suspect demand ischemia in the setting of the above Chronic LBBB Ischemic cardiomyopathy Coronary artery disease status post PCI with stent placement Peripheral artery disease High-sensitivity troponin flat, 75, 75 No evidence of acute ischemia on twelve-lead EKG. Resume home regimen Continue to monitor on telemetry.  Type 2 diabetes with hyperglycemia Last hemoglobin A1c 7.4 on 05/17/24 Heart healthy carb modified diet Insulin  coverage Dietitian consult for patient's education.  Generalized weakness PT OT evaluation Fall precautions.  CKD 3B Appears to be at her baseline  creatinine At baseline GFR 38 Presented with creatinine of 1.60 with GFR of 34. Monitor urine output.  Anemia of chronic disease secondary to the above Hemoglobin 9.1 No reported overt bleeding Continue to monitor H&H  Hypernatremia Hypervolemic on exam Continue IV Lasix . Repeat BMP in the morning  Severe morbid obesity BMI 56 Recommend weight loss outpatient with regular physical activity and healthy dieting.  OSA Noncompliant with CPAP, prior to admission. CPAP nightly if agreeable  Hypertension Resume home oral antihypertensives as blood pressure allows Closely monitor vital signs.  Hyperlipidemia Resume home statin  Hypothyroidism Resume home levothyroxine .  Chronic embolism and thrombosis of right popliteal vein on Eliquis  Resume home Eliquis . Continue to closely monitor.   Critical care time: 55 minutes.   DVT prophylaxis: On Eliquis  5 mg twice daily  Code Status: Full code.  Family Communication: None at bedside.  Disposition Plan: Admitted to telemetry unit.  Consults called: None.  Admission status: Inpatient status.   Status is: Inpatient The patient requires at least 2 midnights for further evaluation and treatment of present condition.   Terry LOISE Hurst MD [facility] Pager [phone]  If 7PM-7AM, please contact night-coverage [email or web address] Password [facility]  12/30/2024, 9:12 PM      [1]  Allergies Allergen Reactions   Ampicillin  Other (See Comments)    Allergic, per [facility]   Aspirin  Nausea Only   Hydrocodone  Other (See Comments)    Allergic, per [facility]   Unasyn  [Ampicillin -Sulbactam Sodium ] Rash and Other (See Comments)    Allergic, per [facility]   "

## 2024-12-30 NOTE — ED Provider Notes (Signed)
 " [place] [facility] Provider Note   CSN: [id number] Arrival date & time: 12/30/24  1446     Patient presents with: Shortness of Breath   Maureen Jackson is a 69 y.o. female.   Pt is a 69 yo female with pmhx significant for CHF (EF 30-35% on ECHO in Oct), CM, CAD, TIA, HTN, DM2, HLD, DVT (on Eliquis ), hypothyroidism, glaucoma, sleep apnea, and obesity.  Pt said she's been having SOB with exertion.  She gets SOB just walking to the bathroom.  No SOB with sitting still.  No f/c.  No cough.  She has not taken her lasix  for a week because she thought it was causing her sugar to drop.  She is concerned she's having diarrhea and is concerned it's her glaucoma meds.  She has also been eating ice cream before bed to try to keep her sugar up in the am.           Prior to Admission medications  Medication Sig Start Date End Date Taking? Authorizing Provider  acetaminophen  (TYLENOL ) 325 MG tablet Take 2 tablets (650 mg total) by mouth every 4 (four) hours as needed for mild pain, fever or headache. 05/25/20   Pearlean Manus, MD  apixaban  (ELIQUIS ) 5 MG TABS tablet Take 1 tablet (5 mg total) by mouth 2 (two) times daily. 05/25/24   Evonnie Lenis, MD  atorvastatin  (LIPITOR ) 80 MG tablet TAKE 1 TABLET(80 MG) BY MOUTH DAILY 09/15/24   Alvan Dorn FALCON, MD  Blood Pressure Monitoring (OMRON 3 SERIES BP MONITOR) DEVI Use as directed 08/27/23   Miriam Norris, NP  calcium  carbonate (TUMS - DOSED IN MG ELEMENTAL CALCIUM ) 500 MG chewable tablet Chew 4 tablets by mouth 2 (two) times daily.    [provider]  cetirizine  (ZYRTEC ) 10 MG tablet Take 10 mg by mouth daily as needed for allergies.  09/12/18   [provider]  COMBIGAN  0.2-0.5 % ophthalmic solution Place 1 drop into both eyes 2 (two) times daily. 02/08/22   [provider]  dorzolamide  (TRUSOPT ) 2 % ophthalmic solution Place 1 drop into the left eye 2 (two) times daily. 10/16/24   Franklyn Sid LOISE, MD  ENTRESTO  24-26 MG Take 1 tablet by mouth 2 (two) times daily. 12/03/24   Zenaida Morene PARAS, MD  furosemide  (LASIX ) 40 MG tablet Take 2 tablets (80 mg total) by mouth daily. May take extra as directed by [facility] Failure [facility]. 11/20/24   Lee, Jordan, NP  glucose blood (ONETOUCH VERIO) test strip Test glucose 4 times day. Patient not taking: Reported on 11/04/2024 04/19/22   Nida, Gebreselassie W, MD  JARDIANCE  25 MG TABS tablet Take 25 mg by mouth daily. 07/21/24   [provider]  latanoprost  (XALATAN ) 0.005 % ophthalmic solution Place 1 drop into the left eye at bedtime. Patient not taking: Reported on 11/04/2024 10/16/24   Franklyn Sid LOISE, MD  levothyroxine  (SYNTHROID ) 200 MCG tablet Take 200 mcg by mouth daily before breakfast. Take 1 tablet with 50mcg for a total dose of 250mcg daily    [provider]  levothyroxine  (SYNTHROID ) 50 MCG tablet Take 1.5 tablets (75 mcg total) by mouth daily before breakfast. Take 1 tablet with 200mcg for a total dose of 275mcg daily 09/20/24   Bryn Bernardino NOVAK, MD  metoprolol  succinate (TOPROL -XL) 25 MG 24 hr tablet Take 1 tablet (25 mg total) by mouth daily. 12/03/24   Zenaida Morene PARAS, MD  NOVOLOG  FLEXPEN 100 UNIT/ML  FlexPen SMARTSIG:20 Unit(s) SUB-Q 3 Times Daily 07/14/24   [provider]  ofloxacin  (OCUFLOX ) 0.3 % ophthalmic solution Place 1 drop into both eyes 4 (four) times daily. 09/20/24   Bryn Bernardino NOVAK, MD  OZEMPIC, 0.25 OR 0.5 MG/DOSE, 2 MG/3ML SOPN Inject 0.5 mg into the skin once a week. Fridays 05/03/23   [provider]  senna-docusate (SENOKOT-S) 8.6-50 MG tablet Take 2 tablets by mouth 2 (two) times daily. Patient taking differently: Take 2 tablets by mouth at bedtime as needed for mild constipation. 05/25/20   Pearlean Manus, MD  traMADol  (ULTRAM ) 50 MG tablet Take 1 tablet (50 mg total) by mouth every 6 (six) hours as needed for moderate pain (pain score 4-6) or severe pain (pain score 7-10). Patient  not taking: Reported on 11/04/2024 09/20/24   Bryn Bernardino NOVAK, MD  VENTOLIN  HFA 108 [phone] Base) MCG/ACT inhaler Inhale 1-2 puffs into the lungs every 6 (six) hours as needed for wheezing or shortness of breath. 05/25/20   Pearlean Manus, MD    Allergies: Ampicillin , Aspirin , Hydrocodone , and Unasyn  [ampicillin -sulbactam sodium ]    Review of Systems  Respiratory:  Positive for shortness of breath.   All other systems reviewed and are negative.   Updated Vital Signs BP (!) 130/92   Pulse 71   Temp 97.9 F (36.6 C) (Oral)   Resp 17   Ht 5' 5 (1.651 m)   Wt (!) 153.3 kg   SpO2 99%   BMI 56.25 kg/m   Physical Exam Vitals and nursing note reviewed.  Constitutional:      Appearance: She is well-developed. She is obese.  HENT:     Head: Normocephalic and atraumatic.     Mouth/Throat:     Mouth: Mucous membranes are moist.     Pharynx: Oropharynx is clear.  Eyes:     Extraocular Movements: Extraocular movements intact.     Pupils: Pupils are equal, round, and reactive to light.  Cardiovascular:     Rate and Rhythm: Normal rate and regular rhythm.  Pulmonary:     Effort: Pulmonary effort is normal.     Breath sounds: Rhonchi present.  Abdominal:     General: Bowel sounds are normal.     Palpations: Abdomen is soft.  Musculoskeletal:        General: Normal range of motion.     Cervical back: Normal range of motion and neck supple.     Right lower leg: Edema present.     Left lower leg: Edema present.  Skin:    General: Skin is warm.     Capillary Refill: Capillary refill takes less than 2 seconds.  Neurological:     General: No focal deficit present.     Mental Status: She is alert and oriented to person, place, and time.  Psychiatric:        Mood and Affect: Mood normal.        Behavior: Behavior normal.     (all labs ordered are listed, but only abnormal results are displayed) Labs Reviewed  BASIC METABOLIC PANEL WITH GFR - Abnormal; Notable for the following  components:      Result Value   Sodium 147 (*)    Chloride 112 (*)    Glucose, Bld 159 (*)    BUN 35 (*)    Creatinine, Ser 1.64 (*)    Calcium  7.2 (*)    GFR, Estimated 34 (*)    All other components within normal limits  PRO BRAIN NATRIURETIC  PEPTIDE - Abnormal; Notable for the following components:   Pro Brain Natriuretic Peptide 2,009.0 (*)    All other components within normal limits  CBC WITH DIFFERENTIAL/PLATELET - Abnormal; Notable for the following components:   RBC 3.66 (*)    Hemoglobin 9.1 (*)    HCT 31.8 (*)    MCH 24.9 (*)    MCHC 28.6 (*)    RDW 17.2 (*)    All other components within normal limits  TROPONIN T, HIGH SENSITIVITY - Abnormal; Notable for the following components:   Troponin T High Sensitivity 75 (*)    All other components within normal limits  TROPONIN T, HIGH SENSITIVITY - Abnormal; Notable for the following components:   Troponin T High Sensitivity 75 (*)    All other components within normal limits  RESP PANEL BY RT-PCR (RSV, FLU A&B, COVID)  RVPGX2  TSH    EKG: EKG Interpretation Date/Time:  Tuesday December 30 2024 15:24:27 EST Ventricular Rate:  73 PR Interval:  148 QRS Duration:  168 QT Interval:  501 QTC Calculation: 556 R Axis:   94  Text Interpretation: Sinus rhythm Left bundle branch block Baseline wander in lead(s) V6 No significant change since last tracing Confirmed by Dean Clarity [phone]) on 12/30/2024 3:44:45 PM  Radiology: DG Chest Portable 1 View Result Date: 12/30/2024 EXAM: 1 VIEW(S) XRAY OF THE CHEST 12/30/2024 03:39:00 PM COMPARISON: 10/14/2024 CLINICAL HISTORY: SOB FINDINGS: LUNGS AND PLEURA: Mild diffuse hazy and interstitial opacities. No pleural effusion. No pneumothorax. HEART AND MEDIASTINUM: Stable cardiomegaly. No acute abnormality of the mediastinal silhouette. BONES AND SOFT TISSUES: Surgical clips at thoracic inlet. No acute osseous abnormality. IMPRESSION: 1. Mild diffuse hazy and interstitial opacities  worrisome for mild edema . 2. Stable cardiomegaly. Electronically signed by: Greig Pique MD 12/30/2024 03:43 PM EST RP Workstation: [id number]     Procedures   Medications Ordered in the ED  furosemide  (LASIX ) injection 40 mg (40 mg Intravenous Given 12/30/24 1611)                                    Medical Decision Making Amount and/or Complexity of Data Reviewed Labs: ordered. Radiology: ordered.  Risk Prescription drug management. Decision regarding hospitalization.   This patient presents to the ED for concern of sob, this involves an extensive number of treatment options, and is a complaint that carries with it a high risk of complications and morbidity.  The differential diagnosis includes chf, pna, covid/flu/rsv   Co morbidities that complicate the patient evaluation  CHF (EF 30-35% on ECHO in Oct), CM, CAD, TIA, HTN, DM2, HLD, DVT (on Eliquis ), hypothyroidism, glaucoma, sleep apnea, and obesity   Additional history obtained:  Additional history obtained from epic chart review External records from outside source obtained and reviewed including EMS report   Lab Tests:  I Ordered, and personally interpreted labs.  The pertinent results include:  cbc with hgb 9.1 (stable); bmp with na elevated at 147, bun elevated at 35 and cr elevated at 1.64 (stable); calcium  low at 7.2; bnp elevated at 2009; trop elevated at 75, but flat   Imaging Studies ordered:  I ordered imaging studies including cxr  I independently visualized and interpreted imaging which showed  Mild diffuse hazy and interstitial opacities worrisome for mild edema .  2. Stable cardiomegaly.   I agree with the radiologist interpretation   Cardiac Monitoring:  The patient was maintained on a  cardiac monitor.  I personally viewed and interpreted the cardiac monitored which showed an underlying rhythm of: nsr   Medicines ordered and prescription drug management:  I ordered medication including  lasix   for sx  Reevaluation of the patient after these medicines showed that the patient improved I have reviewed the patients home medicines and have made adjustments as needed   Test Considered:  ct   Critical Interventions:  Iv lasix    Consultations Obtained:  I requested consultation with the hospitalist (Dr. Shona),  and discussed lab and imaging findings as well as pertinent plan - she will admit   Problem List / ED Course:  CHF exacerbation:  Pt has not taken her lasix  in 1 week.  She does have edema on CXR.  O2 sats drop to the mid-80s with ambulation and she was SOB after iv lasix .  O2 sats come up with rest.  Pt will need adm for diuresis.   Reevaluation:  After the interventions noted above, I reevaluated the patient and found that they have :improved   Social Determinants of Health:  Lives at home   Dispostion:  After consideration of the diagnostic results and the patients response to treatment, I feel that the patent would benefit from admission.       Final diagnoses:  Acute on chronic congestive heart failure, unspecified heart failure type (HCC)  Stage 3b chronic kidney disease [facility])  Hypocalcemia    ED Discharge Orders     None          Dean Clarity, MD 12/30/24 2038  "

## 2024-12-30 NOTE — ED Triage Notes (Signed)
 Pt BIB [facility] for SOB with N/V x 1 week, reported that pt has not taken her lasix  for CHF for a week as well. Reported CBG 201, BP 148/101, HR 80 SR, RA sats 96-97%, reported pt with crackles bilaterally

## 2024-12-31 ENCOUNTER — Inpatient Hospital Stay (HOSPITAL_COMMUNITY)

## 2024-12-31 ENCOUNTER — Other Ambulatory Visit (HOSPITAL_COMMUNITY): Payer: Self-pay | Admitting: *Deleted

## 2024-12-31 LAB — GLUCOSE, CAPILLARY
Glucose-Capillary: 132 mg/dL — ABNORMAL HIGH (ref 70–99)
Glucose-Capillary: 119 mg/dL — ABNORMAL HIGH (ref 70–99)
Glucose-Capillary: 171 mg/dL — ABNORMAL HIGH (ref 70–99)

## 2024-12-31 LAB — BASIC METABOLIC PANEL WITH GFR
Anion gap: 12 (ref 5–15)
BUN: 36 mg/dL — ABNORMAL HIGH (ref 8–23)
CO2: 25 mmol/L (ref 22–32)
Calcium: 7.2 mg/dL — ABNORMAL LOW (ref 8.9–10.3)
Chloride: 109 mmol/L (ref 98–111)
Creatinine, Ser: 1.67 mg/dL — ABNORMAL HIGH (ref 0.44–1.00)
GFR, Estimated: 33 mL/min — ABNORMAL LOW
Glucose, Bld: 149 mg/dL — ABNORMAL HIGH (ref 70–99)
Potassium: 4.1 mmol/L (ref 3.5–5.1)
Sodium: 146 mmol/L — ABNORMAL HIGH (ref 135–145)

## 2024-12-31 LAB — CBC
HCT: 33.8 % — ABNORMAL LOW (ref 36.0–46.0)
Hemoglobin: 9.3 g/dL — ABNORMAL LOW (ref 12.0–15.0)
MCH: 25.1 pg — ABNORMAL LOW (ref 26.0–34.0)
MCHC: 27.5 g/dL — ABNORMAL LOW (ref 30.0–36.0)
MCV: 91.1 fL (ref 80.0–100.0)
Platelets: 203 K/uL (ref 150–400)
RBC: 3.71 MIL/uL — ABNORMAL LOW (ref 3.87–5.11)
RDW: 17.2 % — ABNORMAL HIGH (ref 11.5–15.5)
WBC: 5.7 K/uL (ref 4.0–10.5)
nRBC: 0 % (ref 0.0–0.2)

## 2024-12-31 LAB — PHOSPHORUS: Phosphorus: 4.7 mg/dL — ABNORMAL HIGH (ref 2.5–4.6)

## 2024-12-31 LAB — HEMOGLOBIN A1C
Hgb A1c MFr Bld: 7.2 % — ABNORMAL HIGH (ref 4.8–5.6)
Mean Plasma Glucose: 159.94 mg/dL

## 2024-12-31 LAB — MAGNESIUM: Magnesium: 2.6 mg/dL — ABNORMAL HIGH (ref 1.7–2.4)

## 2024-12-31 MED ORDER — METOPROLOL SUCCINATE ER 25 MG PO TB24: 25.0000 mg | ORAL_TABLET | Freq: Every day | ORAL | 3 refills | Status: AC

## 2024-12-31 MED ORDER — VENTOLIN HFA 108 (90 BASE) MCG/ACT IN AERS: 1.0000 | INHALATION_SPRAY | Freq: Four times a day (QID) | RESPIRATORY_TRACT | 2 refills | Status: AC | PRN

## 2024-12-31 MED ORDER — LEVOTHYROXINE SODIUM 75 MCG PO TABS
75.0000 ug | ORAL_TABLET | Freq: Every day | ORAL | Status: DC
  Administered 2024-12-31: 75 ug via ORAL
  Filled 2024-12-31: qty 1

## 2024-12-31 MED ORDER — ATORVASTATIN CALCIUM 80 MG PO TABS: 80.0000 mg | ORAL_TABLET | Freq: Every day | ORAL | 11 refills | Status: AC

## 2024-12-31 MED ORDER — APIXABAN 5 MG PO TABS: 5.0000 mg | ORAL_TABLET | Freq: Two times a day (BID) | ORAL | 5 refills | Status: AC

## 2024-12-31 MED ORDER — FUROSEMIDE 10 MG/ML IJ SOLN
20.0000 mg | Freq: Two times a day (BID) | INTRAMUSCULAR | Status: DC
  Administered 2024-12-31 (×2): 20 mg via INTRAVENOUS
  Filled 2024-12-31 (×2): qty 2

## 2024-12-31 MED ORDER — ENTRESTO 24-26 MG PO TABS: 1.0000 | ORAL_TABLET | Freq: Two times a day (BID) | ORAL | 3 refills | Status: AC

## 2024-12-31 MED ORDER — FUROSEMIDE 40 MG PO TABS: 40.0000 mg | ORAL_TABLET | Freq: Two times a day (BID) | ORAL | 5 refills | Status: AC

## 2024-12-31 MED ORDER — ACETAMINOPHEN 325 MG PO TABS
650.0000 mg | ORAL_TABLET | Freq: Four times a day (QID) | ORAL | Status: DC | PRN
  Administered 2024-12-31: 650 mg via ORAL
  Filled 2024-12-31: qty 2

## 2024-12-31 MED ORDER — FUROSEMIDE 10 MG/ML IJ SOLN
20.0000 mg | Freq: Once | INTRAMUSCULAR | Status: DC
  Filled 2024-12-31: qty 2

## 2024-12-31 MED ORDER — PROCHLORPERAZINE EDISYLATE 10 MG/2ML IJ SOLN: 5.0000 mg | Freq: Four times a day (QID) | INTRAMUSCULAR | Status: DC | PRN

## 2024-12-31 MED ORDER — MELATONIN 3 MG PO TABS: 6.0000 mg | ORAL_TABLET | Freq: Every evening | ORAL | Status: DC | PRN

## 2024-12-31 MED ORDER — POLYETHYLENE GLYCOL 3350 17 G PO PACK: 17.0000 g | PACK | Freq: Every day | ORAL | Status: DC | PRN

## 2024-12-31 MED ORDER — ATORVASTATIN CALCIUM 40 MG PO TABS
80.0000 mg | ORAL_TABLET | Freq: Every day | ORAL | Status: DC
  Administered 2024-12-31: 80 mg via ORAL
  Filled 2024-12-31: qty 2

## 2024-12-31 NOTE — Evaluation (Addendum)
 Physical Therapy Evaluation Patient Details Name: Maureen Jackson MRN: [id number] DOB: Sep 16, 1956 Today's Date: 12/31/2024  History of Present Illness  ALNITA AYBAR is a 69 y.o. 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  Endorses not taking her home Lasix  for a week because she thought it was causing her blood sugar to drop.  She started eating ice cream before bed to increase her sugar level in the morning.  No reported subjective fevers or chills.   Clinical Impression  Patient was agreeable to PT evaluation. Patient reports at baseline, she has been ambulating in home with [facility] and receives assist for ADLs from PCA. This date, patient requires mod assist for bed mobility, and transfers with RW and min/mod assist for very limited side steps at bedside with RW. Pt moderately fatigued at EOS, rating 5/10. Pt tolerates sitting in chair at EOS, alarm set, and call button in reach. Patient will benefit from continued skilled physical therapy acutely and in recommended venue in order to address current deficits and improve overall function. Shoulde patient return home, would recommend RW at discharge.        If plan is discharge home, recommend the following: A lot of help with walking and/or transfers;A little help with bathing/dressing/bathroom;Assistance with cooking/housework;Assist for transportation;Help with stairs or ramp for entrance   Can travel by private vehicle        Equipment Recommendations  Rolling walker (2 wheels)  Recommendations for Other Services       Functional Status Assessment Patient has had a recent decline in their functional status and demonstrates the ability to make significant improvements in function in a reasonable and predictable amount of time.     Precautions / Restrictions Precautions Precautions: Fall Recall of Precautions/Restrictions: Intact Restrictions Weight Bearing Restrictions Per Provider Order: No      Mobility  Bed Mobility Overal bed mobility: Needs Assistance Bed Mobility: Supine to Sit, Sit to Supine     Supine to sit: Min assist, Mod assist Sit to supine: Min assist, Mod assist   General bed mobility comments: min/mod assist for LE and trunk handling secondary to general weakness, pt with very labored movement    Transfers Overall transfer level: Needs assistance Equipment used: Rolling walker (2 wheels) Transfers: Sit to/from Stand, Bed to chair/wheelchair/BSC Sit to Stand: Mod assist   Step pivot transfers: Contact guard assist, Min assist       General transfer comment: multiple attempts to stand from bed independently but required mod assist due to weakness, pt demo slow labored movement throughout, CGA/min assist for steadying during side steps    Ambulation/Gait Ambulation/Gait assistance: Min assist, Contact guard assist Gait Distance (Feet): 3 Feet Assistive device: Rolling walker (2 wheels) Gait Pattern/deviations: Step-to pattern, Decreased step length - right, Decreased step length - left, Decreased stride length, Trunk flexed Gait velocity: Dec     General Gait Details: CGA/min assist for steadying during side steps at bedside with RW, pt demo very labored movement, limited by fatigue  Stairs            Wheelchair  Mobility     Tilt Bed    Modified Rankin (Stroke Patients Only)       Balance Overall balance assessment: Needs assistance Sitting-balance support: No upper extremity  supported, Feet supported Sitting balance-Leahy Scale: Good Sitting balance - Comments: Seated EOB   Standing balance support: Bilateral upper extremity supported, During functional activity, Reliant on assistive device for balance Standing balance-Leahy Scale: Fair Standing balance comment: w/ RW             Pertinent Vitals/Pain Pain Assessment Pain Assessment: No/denies pain    Home Living Family/patient expects to be discharged to:: Private residence Living Arrangements: Alone Available Help at Discharge: Personal care attendant Type of Home: Apartment Home Access: Level entry       Home Layout: One level Home Equipment: Grab bars - tub/shower;Cane - single point;Rollator (4 wheels) Additional Comments: pt reports no change in home set up since last admission, PCA from 12 to 5PM, 4 days a week.    Prior Function Prior Level of Function : Needs assist;History of Falls (last six months)       Physical Assist : ADLs (physical);Mobility (physical) Mobility (physical): Bed mobility;Transfers;Gait;Stairs ADLs (physical): Dressing;Bathing;IADLs;Toileting Mobility Comments: Reports she continues to be a household ambulator with Rollator and SPC. Using [facility] more often because rollator does not lock. ADLs Comments: Pt reports PCA 4 days/week for ADLs and iADLs; pt reports getting a lot of assist for bathing, dressing, and toileting.     Extremity/Trunk Assessment   Upper Extremity Assessment Upper Extremity Assessment: Generalized weakness    Lower Extremity Assessment Lower Extremity Assessment: Generalized weakness    Cervical / Trunk Assessment Cervical / Trunk Assessment: Kyphotic  Communication   Communication Communication: No apparent difficulties    Cognition Arousal: Alert Behavior During Therapy: WFL for tasks assessed/performed         Following commands: Intact       Cueing Cueing Techniques: Verbal cues, Visual cues     General Comments       Exercises     Assessment/Plan    PT Assessment Patient needs continued PT services;All further PT needs can be met in the next venue of care  PT Problem List Decreased strength;Decreased activity tolerance;Decreased balance;Decreased mobility       PT Treatment Interventions DME instruction;Balance training;Gait training;Functional mobility training;Therapeutic activities;Therapeutic exercise;Patient/family education    PT Goals (Current goals can be found in the Care Plan section)  Acute Rehab PT Goals Patient Stated Goal: Return home PT Goal Formulation: With patient Time For Goal Achievement: 01/14/25 Potential to Achieve Goals: Good    Frequency Min 3X/week     Co-evaluation               AM-PAC PT 6 Clicks Mobility  Outcome Measure Help needed turning from your back to your side while in a flat bed without using bedrails?: A Little Help needed moving from lying on your back to sitting on the side of a flat bed without using bedrails?: A Lot Help needed moving to and from a bed to a chair (including a wheelchair)?: A Little Help needed standing up from a chair using your arms (e.g., wheelchair or bedside chair)?: A Lot Help needed to walk in hospital room?: A Lot Help needed climbing 3-5 steps with a railing? : A Lot 6 Click Score: 14    End of Session Equipment Utilized During Treatment: Gait belt Activity Tolerance: Patient tolerated treatment well;Patient limited by fatigue Patient left: in chair;with call bell/phone  within reach;with chair alarm set Nurse Communication: Mobility status PT Visit Diagnosis: Other abnormalities of gait and mobility (R26.89);Muscle weakness (generalized) (M62.81);Difficulty in walking, not elsewhere classified (R26.2)    Time: 9086-9059 PT Time Calculation (min) (ACUTE ONLY): 27 min   Charges:   PT Evaluation $PT Eval Moderate Complexity: 1 Mod   PT General Charges $$ ACUTE PT VISIT: 1 Visit         11:09 AM,  12/31/24 Rosaria Settler, PT, DPT [place] with [facility]

## 2024-12-31 NOTE — TOC Transition Note (Signed)
 Transition of Care [facility]) - Discharge Note   Patient Details  Name: Maureen Jackson MRN: [id number] Date of Birth: 1956/02/27  Transition of [facility]) CM/SW Contact:  Hoy DELENA Bigness, LCSW Phone Number: 12/31/2024, 12:22 PM   Clinical Narrative:    Pt to return home with home health PT/RN services provided by [facility]. HH orders are in place. Pt recommended for a RW however, pt received a rollator 04/2023 and insurance will not cover a new AD until 04/2028.    Final next level of care: Home w Home Health Services Barriers to Discharge: Barriers Resolved   Patient Goals and CMS Choice Patient states their goals for this hospitalization and ongoing recovery are:: To get better CMS Medicare.gov Compare Post Acute Care list provided to:: Patient Choice offered to / list presented to : Patient      Discharge Placement                  Name of family member notified: Patient Patient and family notified of of transfer: 12/31/24  Discharge Plan and Services Additional resources added to the After Visit Summary for   In-house Referral: Clinical Social Work Discharge Planning Services: NA Post Acute Care Choice: Skilled Nursing Facility, Home Health          DME Arranged: N/A DME Agency: NA       HH Arranged: PT, RN HH Agency: [facility] Date HH Agency Contacted: 12/31/24 Time HH Agency Contacted: 1222 Representative spoke with at [facility] Agency: [place]  Social Drivers of Health (SDOH) Interventions SDOH Screenings   Food Insecurity: No Food Insecurity (12/30/2024)  Housing: Low Risk (12/30/2024)  Transportation Needs: No Transportation Needs (12/30/2024)  Utilities: Not At Risk (12/30/2024)  Alcohol  Screen: Low Risk (12/22/2022)  Financial Resource Strain: Low Risk (05/19/2024)  Social Connections: Unknown (12/30/2024)  Tobacco Use: Medium Risk (12/30/2024)     Readmission Risk Interventions    09/19/2024    3:34 PM 09/15/2024   11:39 AM 05/22/2024     2:03 PM  Readmission Risk Prevention Plan  Transportation Screening Complete Complete Complete  Home Care Screening   Complete  Medication Review (RN CM)   Complete  HRI or Home Care Consult Complete Complete   Social Work Consult for Recovery Care Planning/Counseling Complete Complete   Palliative Care Screening Not Applicable Not Applicable   Medication Review [employer]) Complete Complete

## 2024-12-31 NOTE — Plan of Care (Signed)
" °  Problem: Acute Rehab PT Goals(only PT should resolve) Goal: Pt Will Go Supine/Side To Sit Outcome: Progressing Flowsheets (Taken 12/31/2024 1110) Pt will go Supine/Side to Sit: with contact guard assist Goal: Patient Will Transfer Sit To/From Stand Outcome: Progressing Flowsheets (Taken 12/31/2024 1110) Patient will transfer sit to/from stand: with contact guard assist Goal: Pt Will Transfer Bed To Chair/Chair To Bed Outcome: Progressing Flowsheets (Taken 12/31/2024 1110) Pt will Transfer Bed to Chair/Chair to Bed: with supervision Goal: Pt Will Ambulate Outcome: Progressing Flowsheets (Taken 12/31/2024 1110) Pt will Ambulate:  10 feet  with least restrictive assistive device  with supervision   11:11 AM, 12/31/24 Rosaria Settler, PT, DPT [place] with [facility]  "

## 2024-12-31 NOTE — Care Management Obs Status (Signed)
 MEDICARE OBSERVATION STATUS NOTIFICATION   Patient Details  Name: Maureen Jackson MRN: [id number] Date of Birth: 06-03-1956   Medicare Observation Status Notification Given:  Yes    Hoy DELENA Bigness, LCSW 12/31/2024, 10:41 AM

## 2024-12-31 NOTE — Discharge Instructions (Signed)
 1)Very Low-salt diet advised---Less than 2 gm of Sodium per day advised----ok to use Mrs DASH salt substitute instead of Salt 2)Weigh yourself daily, call if you gain more than 3 pounds in 1 day or more than 5 pounds in 1 week as your diuretic medications may need to be adjusted 3)Please change Furosemide /Lasix  to 40 mg twice daily from 40 mg once daily 4)Avoid ibuprofen/Advil/Aleve/Motrin/Goody Powders/Naproxen/BC powders/Meloxicam/Diclofenac /Indomethacin and other Nonsteroidal anti-inflammatory medications as these will make you more likely to bleed and can cause stomach ulcers, can also cause Kidney problems. 5)Watch for bleeding while on Blood Thinners--watch for blood in your stool which can make your stool black, maroon, mahogany or red---, blood in your urine which can make your urine pink or red, nosebleeds , also watch for possible bruising -You are taking Apixaban /Eliquis --- which is a blood thinner--- be careful to avoid injury or falls

## 2024-12-31 NOTE — Plan of Care (Signed)
" °  Problem: Education: Goal: Knowledge of General Education information will improve Description: Including pain rating scale, medication(s)/side effects and non-pharmacologic comfort measures Outcome: Progressing   Problem: Health Behavior/Discharge Planning: Goal: Ability to manage health-related needs will improve Outcome: Progressing   Problem: Clinical Measurements: Goal: Ability to maintain clinical measurements within normal limits will improve Outcome: Progressing Goal: Will remain free from infection Outcome: Progressing Goal: Diagnostic test results will improve Outcome: Progressing Goal: Respiratory complications will improve Outcome: Progressing Goal: Cardiovascular complication will be avoided Outcome: Progressing   Problem: Activity: Goal: Risk for activity intolerance will decrease Outcome: Progressing   Problem: Coping: Goal: Level of anxiety will decrease Outcome: Progressing   Problem: Elimination: Goal: Will not experience complications related to urinary retention Outcome: Progressing   Problem: Safety: Goal: Ability to remain free from injury will improve Outcome: Progressing   Problem: Skin Integrity: Goal: Risk for impaired skin integrity will decrease Outcome: Progressing   Problem: Education: Goal: Ability to describe self-care measures that may prevent or decrease complications (Diabetes Survival Skills Education) will improve Outcome: Progressing Goal: Individualized Educational Video(s) Outcome: Progressing   Problem: Coping: Goal: Ability to adjust to condition or change in health will improve Outcome: Progressing   Problem: Fluid Volume: Goal: Ability to maintain a balanced intake and output will improve Outcome: Progressing   Problem: Health Behavior/Discharge Planning: Goal: Ability to identify and utilize available resources and services will improve Outcome: Progressing Goal: Ability to manage health-related needs will  improve Outcome: Progressing   Problem: Metabolic: Goal: Ability to maintain appropriate glucose levels will improve Outcome: Progressing   Problem: Nutritional: Goal: Maintenance of adequate nutrition will improve Outcome: Progressing Goal: Progress toward achieving an optimal weight will improve Outcome: Progressing   Problem: Skin Integrity: Goal: Risk for impaired skin integrity will decrease Outcome: Progressing   Problem: Tissue Perfusion: Goal: Adequacy of tissue perfusion will improve Outcome: Progressing   "

## 2024-12-31 NOTE — Progress Notes (Signed)
 Mobility Specialist Progress Note:    12/31/24 1000  Mobility  Activity Ambulated with assistance  Level of Assistance Contact guard assist, steadying assist  Assistive Device Front wheel walker  Distance Ambulated (ft) 50 ft  Range of Motion/Exercises Active;All extremities  Activity Response Tolerated well  Mobility Referral Yes  Mobility visit 1 Mobility  Mobility Specialist Start Time (ACUTE ONLY) 1000  Mobility Specialist Stop Time (ACUTE ONLY) 1020  Mobility Specialist Time Calculation (min) (ACUTE ONLY) 20 min   Pt received in chair, agreeable to mobility. Required CGA to stand and ambulate with RW. Tolerated well, c/o SOB and BLE pain. SpO2 98% on RA at rest, SpO2 94% on RA during ambulation. Returned to chair, nurse in room. All needs met.  Daxen Lanum Mobility Specialist Please contact via Special Educational Needs Teacher or  Rehab office at [phone]

## 2024-12-31 NOTE — Progress Notes (Signed)
 OT Cancellation Note  Patient Details Name: Maureen Jackson MRN: [id number] DOB: 1956/05/12   Cancelled Treatment:    Reason Eval/Treat Not Completed: Other (comment) Pt discharging home with [facility] services.   [name] 12/31/2024, 1:48 PM

## 2024-12-31 NOTE — Discharge Summary (Signed)
 "                                                                                 Maureen Jackson, is a 69 y.o. female  DOB [birth year]  MRN [id number].  Admission date:  12/30/2024  Admitting Physician  Terry LOISE Hurst, DO  Discharge Date:  12/31/2024   Primary MD  Orpha Yancey LABOR, MD  Recommendations for primary care physician for things to follow:  1)Very Low-salt diet advised---Less than 2 gm of Sodium per day advised----ok to use Mrs DASH salt substitute instead of Salt 2)Weigh yourself daily, call if you gain more than 3 pounds in 1 day or more than 5 pounds in 1 week as your diuretic medications may need to be adjusted 3)Please change Furosemide /Lasix  to 40 mg twice daily from 40 mg once daily 4)Avoid ibuprofen/Advil/Aleve/Motrin/Goody Powders/Naproxen/BC powders/Meloxicam/Diclofenac /Indomethacin and other Nonsteroidal anti-inflammatory medications as these will make you more likely to bleed and can cause stomach ulcers, can also cause Kidney problems. 5)Watch for bleeding while on Blood Thinners--watch for blood in your stool which can make your stool black, maroon, mahogany or red---, blood in your urine which can make your urine pink or red, nosebleeds , also watch for possible bruising -You are taking Apixaban /Eliquis --- which is a blood thinner--- be careful to avoid injury or falls  Admission Diagnosis  Hypocalcemia [E83.51] Acute on chronic systolic (congestive) heart failure (HCC) [I50.23] Stage 3b chronic kidney disease (HCC) [N18.32] Acute on chronic congestive heart failure, unspecified heart failure type (HCC) [I50.9]   Discharge Diagnosis  Hypocalcemia [E83.51] Acute on chronic systolic (congestive) heart failure (HCC) [I50.23] Stage 3b chronic kidney disease (HCC) [N18.32] Acute on chronic congestive heart failure, unspecified heart failure type (HCC) [I50.9]    Principal Problem:   Acute on chronic systolic (congestive) heart failure (HCC)      Past Medical History:   Diagnosis Date   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 multivessel CAD by cath in 09/2017 and not felt to be a CABG candidate --> underwent two-vessel PCI with DES to the LAD and DES to the RCA   Diabetes mellitus    Diabetes mellitus, type II (HCC)    Dyspnea    Glaucoma    Hypertension    Left bundle branch block    Morbid obesity (HCC)    Obstructive sleep apnea    does not wear CPAP   Thyroid  disease     Past Surgical History:  Procedure Laterality Date   ABDOMINAL HYSTERECTOMY     CARDIAC CATHETERIZATION  09/12/2017   CORONARY STENT INTERVENTION  09/12/2017   STENT RESOLUTE ONYX 7.24K61 drug eluting stent was successfully placed   CORONARY STENT INTERVENTION N/A 09/12/2017   Procedure: CORONARY STENT INTERVENTION;  Surgeon: Anner Alm ORN, MD;  Location: [facility] INVASIVE CV LAB;  Service: Cardiovascular;  Laterality: N/A;   INCISION AND DRAINAGE PERIRECTAL ABSCESS Left 09/16/2019   Procedure: IRRIGATION AND DEBRIDEMENT LABIA ABSCESS;  Surgeon: Vernetta Berg, MD;  Location: [facility] OR;  Service: General;  Laterality: Left;   IR FLUORO GUIDE CV LINE RIGHT  09/16/2019   IR US  GUIDE VASC ACCESS RIGHT  09/16/2019   LEFT HEART CATH AND CORONARY ANGIOGRAPHY N/A 09/12/2017   Procedure: LEFT HEART CATH AND CORONARY ANGIOGRAPHY;  Surgeon: Anner Alm ORN, MD;  Location: [facility] INVASIVE CV LAB;  Service: Cardiovascular;  Laterality: N/A;   MASS EXCISION N/A 12/18/2018   Procedure: EXCISION TONGUE MASS;  Surgeon: Karis Clunes, MD;  Location: [facility] OR;  Service: ENT;  Laterality: N/A;   PERICARDIOCENTESIS N/A 12/22/2022   Procedure: PERICARDIOCENTESIS;  Surgeon: Wendel Lurena POUR, MD;  Location: [facility] INVASIVE CV LAB;  Service: Cardiovascular;  Laterality: N/A;   RIGHT/LEFT HEART CATH AND CORONARY ANGIOGRAPHY N/A 09/10/2017   Procedure: RIGHT/LEFT HEART CATH AND  CORONARY ANGIOGRAPHY;  Surgeon: Burnard Debby LABOR, MD;  Location: [facility] INVASIVE CV LAB;  Service: Cardiovascular;  Laterality: N/A;   SUBXYPHOID PERICARDIAL WINDOW N/A 12/23/2022   Procedure: SUBXYPHOID PERICARDIAL WINDOW;  Surgeon: Maryjane Mt, MD;  Location: [facility] OR;  Service: Thoracic;  Laterality: N/A;   TEE WITHOUT CARDIOVERSION N/A 12/23/2022   Procedure: TRANSESOPHAGEAL ECHOCARDIOGRAM (TEE);  Surgeon: Maryjane Mt, MD;  Location: [facility] OR;  Service: Thoracic;  Laterality: N/A;   THYROID  SURGERY       HPI  from the history and physical done on the day of admission:    Chief Complaint: Shortness of breath.   HPI: [name] is a 69 y.o. 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  Endorses not taking her home Lasix  for a week because she thought it was causing her blood sugar to drop.  She started eating ice cream before bed to increase her sugar level in the morning.  No reported subjective fevers or chills.   In the ER, volume overload on exam and tachypneic.  proBNP greater than 2000 high-sensitivity troponin 75, 75.  Chest x-ray showing cardiomegaly and pulmonary edema.   The patient received 1 dose of IV Lasix  40 mg x 1.  TRH, hospitalist service, was asked to admit.   ED Course: Temperature 97.9.  BP 130/92, pulse 74, respiratory rate 13, O2 saturation 100% on room air.   Review of Systems: Review of systems as noted in the HPI. All other systems reviewed and are negative.   Hospital Course:    Assessment and Plan: Acute on chronic combined diastolic and systolic CHF secondary to diuretics and diet noncompliance. Presented with proBNP greater than 2000, volume overload on exam, chest x-ray with pulmonary edema. Last 2D echo done on 09/15/2024 revealed LVEF 30 to 35% with grade 1 diastolic dysfunction (similar to echo from 05/17/2024) - Consider repeat echo this spring as outpatient with heart failure team -- As per advanced heart failure cardiology note from 11/04/2024--patient was supposed to take 60 mg of Lasix  twice daily for 3 days and then 80 mg of Lasix  daily after the--- apparently patient misunderstood instructions and after finishing the 60 mg twice daily for 3 days she has been taking only 40 mg of Lasix  daily instead of 80 daily--patient  misunderstood instructions on how to take her Lasix  as she developed CHF flareup -- Patient significantly improved here after IV Lasix  ,patient diuresed well here with low-dose IV Lasix  20 mg twice daily -Ambulated in hallways today without significant dyspnea on exertion O2 sats 94 to 98% with ambulation and at rest respectively. --- No chest pains or palpitations -Significantly improved after treatment with IV Lasix  -Okay to discharge on Lasix  40 mg twice daily - continue Entresto , metoprolol  and Jardiance    Elevated troponin, suspect demand ischemia in the setting of the above Chronic LBBB Ischemic cardiomyopathy Coronary artery disease status post PCI with stent placement Peripheral artery disease High-sensitivity troponin flat, 75, 75 No evidence of acute ischemia on twelve-lead EKG. -Troponin and EKG pattern not consistent with ACS Resume home regimen including metoprolol    Type 2 diabetes with hyperglycemia Last hemoglobin A1c 7.4 on 05/17/24 reflecting uncontrolled DM with hyperglycemia PTA Heart healthy carb modified diet -Resume PTA diabetic regimen including Ozempic  Generalized weakness PT appreciated--patient ambulating  much better after diuresis -Physical therapy recommended home health physical therapy   CKD 3B Appears to be at her baseline creatinine At baseline GFR 38 Presented with creatinine of 1.60 with GFR of 36. =-Diuresing well with Lasix    Anemia of chronic disease secondary to the above Hemoglobin 9.3 No reported overt bleeding -  Mild Hypernatremia - Should improve with adjustments to her Lasix  regimen   Morbid Obesity- -Low calorie diet, portion control and increase physical activity discussed with patient -Body mass index is 57.49 kg/m.   OSA Noncompliant with CPAP, prior to admission. -Patient not interested in sleep study or CPAP use   Hypertension -Resume PTA regimen -Lasix  adjusted as above   Hyperlipidemia Resume home statin   Hypothyroidism Resume home levothyroxine .   Chronic embolism and thrombosis of right popliteal vein on Eliquis  Resume home Eliquis . Continue to closely monitor.   Discharge Condition: Stable, ambulating without chest pain dyspnea or hypoxia  Follow UP   Follow-up Information     Hasanaj, Yancey LABOR, MD. Schedule an appointment as soon as possible for a visit in 1 week(s).   Specialty: Internal Medicine Contact information: [address] [place] [address] [phone]                 Diet and Activity recommendation:  As advised  Discharge Instructions   Discharge Instructions     Call MD for:  difficulty breathing, headache or visual disturbances   Complete by: As directed    Call MD for:  persistant dizziness or light-headedness   Complete by: As directed    Call MD for:  temperature >100.4   Complete by: As directed    Diet - low sodium heart healthy   Complete by: As directed    Discharge instructions   Complete by: As directed    1)Very Low-salt diet advised---Less than 2 gm of Sodium per day advised----ok to use Mrs DASH salt substitute instead of Salt 2)Weigh yourself daily, call if you gain more than 3  pounds in 1 day or more than 5 pounds in 1 week as your diuretic medications may need to be adjusted 3)Please change Furosemide /Lasix  to 40 mg twice daily from 40 mg once daily 4)Avoid ibuprofen/Advil/Aleve/Motrin/Goody Powders/Naproxen/BC powders/Meloxicam/Diclofenac /Indomethacin and other Nonsteroidal anti-inflammatory medications as these will make you more likely to bleed and can cause stomach ulcers, can also cause Kidney problems. 5)Watch for bleeding while on Blood Thinners--watch for blood in your stool which can make your stool  black, maroon, mahogany or red---, blood in your urine which can make your urine pink or red, nosebleeds , also watch for possible bruising -You are taking Apixaban /Eliquis --- which is a blood thinner--- be careful to avoid injury or falls   Increase activity slowly   Complete by: As directed        Discharge Medications     Allergies as of 12/31/2024       Reactions   Ampicillin  Other (See Comments)   Allergic, per [facility]   Aspirin  Nausea Only   Hydrocodone  Other (See Comments)   Allergic, per [facility]   Unasyn  [ampicillin -sulbactam Sodium ] Rash, Other (See Comments)   Allergic, per [facility]        Medication List     TAKE these medications    acetaminophen  325 MG tablet Commonly known as: TYLENOL  Take 2 tablets (650 mg total) by mouth every 4 (four) hours as needed for mild pain, fever or headache.   apixaban  5 MG Tabs tablet Commonly known as: ELIQUIS  Take 1 tablet (5 mg total) by mouth 2 (two) times daily.   atorvastatin  80 MG tablet Commonly known as: LIPITOR  Take 1 tablet (80 mg total) by mouth daily. What changed: See the new instructions.   calcium  carbonate 500 MG chewable tablet Commonly known as: TUMS - dosed in mg elemental calcium  Chew 4 tablets by mouth 2 (two) times daily.   Combigan  0.2-0.5 % ophthalmic solution Generic drug: brimonidine -timolol  Place 1 drop into both eyes 2 (two) times daily.   dorzolamide  2 % ophthalmic  solution Commonly known as: TRUSOPT  Place 1 drop into the left eye 2 (two) times daily.   Entresto  24-26 MG Generic drug: sacubitril -valsartan  Take 1 tablet by mouth 2 (two) times daily.   furosemide  40 MG tablet Commonly known as: LASIX  Take 1 tablet (40 mg total) by mouth 2 (two) times daily. May take extra as directed by Heart Failure Clinic. What changed:  how much to take when to take this   Jardiance  25 MG Tabs tablet Generic drug: empagliflozin  Take 25 mg by mouth daily.   latanoprost  0.005 % ophthalmic solution Commonly known as: Xalatan  Place 1 drop into the left eye at bedtime.   levothyroxine  200 MCG tablet Commonly known as: SYNTHROID  Take 200 mcg by mouth daily before breakfast. Take 1 tablet with 50mcg for a total dose of 250mcg daily   levothyroxine  50 MCG tablet Commonly known as: SYNTHROID  Take 1.5 tablets (75 mcg total) by mouth daily before breakfast. Take 1 tablet with 200mcg for a total dose of 275mcg daily   metoprolol  succinate 25 MG 24 hr tablet Commonly known as: TOPROL -XL Take 1 tablet (25 mg total) by mouth daily.   Omron 3 Series BP Monitor Devi Use as directed   OneTouch Verio test strip Generic drug: glucose blood Test glucose 4 times day.   Ozempic (0.25 or 0.5 MG/DOSE) 2 MG/3ML Sopn Generic drug: Semaglutide(0.25 or 0.5MG /DOS) Inject 0.5 mg into the skin once a week. Fridays   prednisoLONE  acetate 1 % ophthalmic suspension Commonly known as: PRED FORTE  Place 1 drop into the left eye 4 (four) times daily.   promethazine -dextromethorphan  6.25-15 MG/5ML syrup Commonly known as: PROMETHAZINE -DM Take 5 mLs by mouth every 4 (four) hours as needed.   senna-docusate 8.6-50 MG tablet Commonly known as: Senokot-S Take 2 tablets by mouth 2 (two) times daily. What changed:  when to take this reasons to take this   Ventolin  HFA 108 (90 Base) MCG/ACT inhaler Generic drug: albuterol  Inhale  1-2 puffs into the lungs every 6 (six) hours as  needed for wheezing or shortness of breath.               Durable Medical Equipment  (From admission, onward)           Start     Ordered   12/31/24 1116  For home use only DME Walker rolling  Once       Comments: Patient requires assist from RW for ambulation at time of evaluation due to LE weakness and decreased endurance. Patient has a history of falls and only has a SPC at home. Patient's current rollator has faulty equipment as well.   11:18 AM, 12/31/24 Rosaria Settler, PT, DPT [place] with [facility]  Question Answer Comment  Walker: With 5 Inch Wheels   Patient needs a walker to treat with the following condition Falls      12/31/24 1122           Major procedures and Radiology Reports - PLEASE review detailed and final reports for all details, in brief -   DG Chest Portable 1 View Result Date: 12/30/2024 EXAM: 1 VIEW(S) XRAY OF THE CHEST 12/30/2024 03:39:00 PM COMPARISON: 10/14/2024 CLINICAL HISTORY: SOB FINDINGS: LUNGS AND PLEURA: Mild diffuse hazy and interstitial opacities. No pleural effusion. No pneumothorax. HEART AND MEDIASTINUM: Stable cardiomegaly. No acute abnormality of the mediastinal silhouette. BONES AND SOFT TISSUES: Surgical clips at thoracic inlet. No acute osseous abnormality. IMPRESSION: 1. Mild diffuse hazy and interstitial opacities worrisome for mild edema . 2. Stable cardiomegaly. Electronically signed by: Greig Pique MD 12/30/2024 03:43 PM EST RP Workstation: [id number]   Micro Results  Recent Results (from the past 240 hours)  Resp panel by RT-PCR (RSV, Flu A&B, Covid) Anterior Nasal Swab     Status: None   Collection Time: 12/30/24  6:19 PM   Specimen: Anterior Nasal Swab  Result Value Ref Range Status   SARS Coronavirus 2 by RT PCR NEGATIVE NEGATIVE Final    Comment: (NOTE) SARS-CoV-2 target nucleic acids are NOT DETECTED.  The SARS-CoV-2 RNA is generally detectable in upper respiratory specimens during the acute  phase of infection. The lowest concentration of SARS-CoV-2 viral copies this assay can detect is 138 copies/mL. A negative result does not preclude SARS-Cov-2 infection and should not be used as the sole basis for treatment or other patient management decisions. A negative result may occur with  improper specimen collection/handling, submission of specimen other than nasopharyngeal swab, presence of viral mutation(s) within the areas targeted by this assay, and inadequate number of viral copies(<138 copies/mL). A negative result must be combined with clinical observations, patient history, and epidemiological information. The expected result is Negative.  Fact Sheet for Patients:  bloggercourse.com  Fact Sheet for Healthcare Providers:  seriousbroker.it  This test is no t yet approved or cleared by the [place]  FDA and  has been authorized for detection and/or diagnosis of SARS-CoV-2 by FDA under an Emergency Use Authorization (EUA). This EUA will remain  in effect (meaning this test can be used) for the duration of the COVID-19 declaration under Section 564(b)(1) of the Act, 21 U.S.C.section 360bbb-3(b)(1), unless the authorization is terminated  or revoked sooner.       Influenza A by PCR NEGATIVE NEGATIVE Final   Influenza B by PCR NEGATIVE NEGATIVE Final    Comment: (NOTE) The Xpert Xpress SARS-CoV-2/FLU/RSV plus assay is intended as an aid in the diagnosis of influenza from Nasopharyngeal swab specimens and  should not be used as a sole basis for treatment. Nasal washings and aspirates are unacceptable for Xpert Xpress SARS-CoV-2/FLU/RSV testing.  Fact Sheet for Patients: bloggercourse.com  Fact Sheet for Healthcare Providers: seriousbroker.it  This test is not yet approved or cleared by the [place]  FDA and has been authorized for detection and/or diagnosis of  SARS-CoV-2 by FDA under an Emergency Use Authorization (EUA). This EUA will remain in effect (meaning this test can be used) for the duration of the COVID-19 declaration under Section 564(b)(1) of the Act, 21 U.S.C. section 360bbb-3(b)(1), unless the authorization is terminated or revoked.     Resp Syncytial Virus by PCR NEGATIVE NEGATIVE Final    Comment: (NOTE) Fact Sheet for Patients: bloggercourse.com  Fact Sheet for Healthcare Providers: seriousbroker.it  This test is not yet approved or cleared by the [place]  FDA and has been authorized for detection and/or diagnosis of SARS-CoV-2 by FDA under an Emergency Use Authorization (EUA). This EUA will remain in effect (meaning this test can be used) for the duration of the COVID-19 declaration under Section 564(b)(1) of the Act, 21 U.S.C. section 360bbb-3(b)(1), unless the authorization is terminated or revoked.  Performed at [facility], [address], [place], [place] [address]    Today   Subjective    Deziya Amero today has no new complaints - Ambulating the hallways without dizziness palpitations or dyspnea - O2 sats 98% at rest and 94% with ambulation   Patient has been seen and examined prior to discharge   Objective   Blood pressure 135/76, pulse 72, temperature 97.7 F (36.5 C), temperature source Oral, resp. rate 19, height 5' 5 (1.651 m), weight (!) 156.7 kg, SpO2 96%.  No intake or output data in the 24 hours ending 12/31/24 1151  Exam Gen:- Awake Alert, no acute distress, morbidly obese in no acute distress, no conversational dyspnea HEENT:- [place].AT, No sclera icterus Neck-Supple Neck,No JVD,.  Lungs-fair air movement, no wheezing, no rales  CV- S1, S2 normal, regular Abd-  +ve B.Sounds, Abd Soft, No tenderness, increased truncal adiposity Extremity/Skin:- No significant edema,   good pulses Psych-affect is appropriate, oriented x3 Neuro-no new  focal deficits, no tremors    Data Review   CBC w Diff:  Lab Results  Component Value Date   WBC 5.7 12/31/2024   HGB 9.3 (L) 12/31/2024   HCT 33.8 (L) 12/31/2024   PLT 203 12/31/2024   LYMPHOPCT 32 12/30/2024   MONOPCT 7 12/30/2024   EOSPCT 5 12/30/2024   BASOPCT 0 12/30/2024   CMP:  Lab Results  Component Value Date   NA 146 (H) 12/31/2024   NA 144 02/21/2023   K 4.1 12/31/2024   CL 109 12/31/2024   CO2 25 12/31/2024   BUN 36 (H) 12/31/2024   BUN 19 02/21/2023   CREATININE 1.67 (H) 12/31/2024   PROT 6.5 10/14/2024   ALBUMIN  2.8 (L) 10/14/2024   BILITOT 0.8 10/14/2024   ALKPHOS 217 (H) 10/14/2024   AST 34 10/14/2024   ALT 28 10/14/2024   Total Discharge time is about 33 minutes  Rendall Carwin M.D on 12/31/2024 at 11:51 AM  Go to [email or web address] -  for contact info  [facility] - Office  [phone]   "

## 2024-12-31 NOTE — Progress Notes (Signed)
 Heart Failure Navigator Progress Note Patient is a current AHF Team patient. She has to set up transportation 3 days prior to scheduled appointments.  Patient shared she will possibly be discharged today from [facility]..  AHF appointment scheduled for 01/09/25 @ [facility].  Patient aware of appointment details and will set up her own transportation through the [employer].  Navigator had also set up Paramedicine services in the past with [facility]. Buel has reached out to patient with past hospital discharges and unable to connect with her due to patient always being gone during visits. Will reach out to [facility] again today to let her know to try again to connect with patient if possible prior to her scheduled appointment to make sure she had set up transportation for that and check in with her.  Navigator available for reassessment of patient but will sign off at this time.  Charmaine Pines, RN, BSN [facility] Heart Failure Navigator Secure Chat Only

## 2024-12-31 NOTE — Progress Notes (Signed)
 Spoke with RN about patient and when she had gotten up to the floor.  I told RN that patient had order for CPAP, unfortunately the patient went right to sleep per RN before I got there to ask her about CPAP.  RN stated patient is on RA and doing fine at this time, but will check with patient if she wakes up to ask her about wearing CPAP.  RN to call.

## 2024-12-31 NOTE — Care Management CC44 (Signed)
"         Condition Code 44 Documentation Completed  Patient Details  Name: Maureen Jackson MRN: [id number] Date of Birth: [birth year]   Condition Code 44 given:  Yes Patient signature on Condition Code 44 notice:  Yes Documentation of 2 MD's agreement:  Yes Code 44 added to claim:  Yes    Hoy DELENA Bigness, LCSW 12/31/2024, 10:41 AM  "

## 2024-12-31 NOTE — Care Management Important Message (Signed)
 Important Message  Patient Details  Name: Maureen Jackson MRN: [id number] Date of Birth: [birth year]   Important Message Given:  N/A - LOS <3 / Initial given by admissions     Duwaine LITTIE Ada 12/31/2024, 12:32 PM

## 2025-01-04 ENCOUNTER — Emergency Department (HOSPITAL_COMMUNITY)

## 2025-01-04 ENCOUNTER — Encounter (HOSPITAL_COMMUNITY): Payer: Self-pay

## 2025-01-04 ENCOUNTER — Other Ambulatory Visit: Payer: Self-pay

## 2025-01-04 ENCOUNTER — Inpatient Hospital Stay (HOSPITAL_COMMUNITY)
Admission: EM | Admit: 2025-01-04 | Discharge: 2025-01-08 | DRG: 291 | Disposition: A | Discharge status: Home Health | Attending: Internal Medicine | Admitting: Internal Medicine

## 2025-01-04 DIAGNOSIS — N179 Acute kidney failure, unspecified: Secondary | ICD-10-CM | POA: Diagnosis present

## 2025-01-04 DIAGNOSIS — I130 Hypertensive heart and chronic kidney disease with heart failure and stage 1 through stage 4 chronic kidney disease, or unspecified chronic kidney disease: Principal | ICD-10-CM | POA: Diagnosis present

## 2025-01-04 DIAGNOSIS — Z885 Allergy status to narcotic agent status: Secondary | ICD-10-CM

## 2025-01-04 DIAGNOSIS — Z886 Allergy status to analgesic agent status: Secondary | ICD-10-CM

## 2025-01-04 DIAGNOSIS — E1122 Type 2 diabetes mellitus with diabetic chronic kidney disease: Secondary | ICD-10-CM | POA: Diagnosis present

## 2025-01-04 DIAGNOSIS — I509 Heart failure, unspecified: Principal | ICD-10-CM

## 2025-01-04 DIAGNOSIS — E1165 Type 2 diabetes mellitus with hyperglycemia: Secondary | ICD-10-CM | POA: Diagnosis present

## 2025-01-04 DIAGNOSIS — Z91119 Patient's noncompliance with dietary regimen due to unspecified reason: Secondary | ICD-10-CM

## 2025-01-04 DIAGNOSIS — Z7985 Long-term (current) use of injectable non-insulin antidiabetic drugs: Secondary | ICD-10-CM

## 2025-01-04 DIAGNOSIS — Z79890 Hormone replacement therapy (postmenopausal): Secondary | ICD-10-CM

## 2025-01-04 DIAGNOSIS — Z833 Family history of diabetes mellitus: Secondary | ICD-10-CM

## 2025-01-04 DIAGNOSIS — E039 Hypothyroidism, unspecified: Secondary | ICD-10-CM | POA: Diagnosis present

## 2025-01-04 DIAGNOSIS — Z8249 Family history of ischemic heart disease and other diseases of the circulatory system: Secondary | ICD-10-CM

## 2025-01-04 DIAGNOSIS — Z79899 Other long term (current) drug therapy: Secondary | ICD-10-CM

## 2025-01-04 DIAGNOSIS — E782 Mixed hyperlipidemia: Secondary | ICD-10-CM | POA: Diagnosis present

## 2025-01-04 DIAGNOSIS — I5043 Acute on chronic combined systolic (congestive) and diastolic (congestive) heart failure: Secondary | ICD-10-CM | POA: Diagnosis present

## 2025-01-04 DIAGNOSIS — I2510 Atherosclerotic heart disease of native coronary artery without angina pectoris: Secondary | ICD-10-CM | POA: Diagnosis present

## 2025-01-04 DIAGNOSIS — E1151 Type 2 diabetes mellitus with diabetic peripheral angiopathy without gangrene: Secondary | ICD-10-CM | POA: Diagnosis present

## 2025-01-04 DIAGNOSIS — R531 Weakness: Secondary | ICD-10-CM | POA: Diagnosis present

## 2025-01-04 DIAGNOSIS — Z800 Family history of malignant neoplasm of digestive organs: Secondary | ICD-10-CM

## 2025-01-04 DIAGNOSIS — Z880 Allergy status to penicillin: Secondary | ICD-10-CM

## 2025-01-04 DIAGNOSIS — M069 Rheumatoid arthritis, unspecified: Secondary | ICD-10-CM | POA: Diagnosis present

## 2025-01-04 DIAGNOSIS — Z7901 Long term (current) use of anticoagulants: Secondary | ICD-10-CM

## 2025-01-04 DIAGNOSIS — J9621 Acute and chronic respiratory failure with hypoxia: Secondary | ICD-10-CM | POA: Diagnosis present

## 2025-01-04 DIAGNOSIS — I3139 Other pericardial effusion (noninflammatory): Secondary | ICD-10-CM | POA: Diagnosis present

## 2025-01-04 DIAGNOSIS — I255 Ischemic cardiomyopathy: Secondary | ICD-10-CM | POA: Diagnosis present

## 2025-01-04 DIAGNOSIS — Z7984 Long term (current) use of oral hypoglycemic drugs: Secondary | ICD-10-CM

## 2025-01-04 DIAGNOSIS — I2489 Other forms of acute ischemic heart disease: Secondary | ICD-10-CM | POA: Diagnosis present

## 2025-01-04 DIAGNOSIS — Z6841 Body Mass Index (BMI) 40.0 and over, adult: Secondary | ICD-10-CM

## 2025-01-04 DIAGNOSIS — G4733 Obstructive sleep apnea (adult) (pediatric): Secondary | ICD-10-CM | POA: Diagnosis present

## 2025-01-04 DIAGNOSIS — E870 Hyperosmolality and hypernatremia: Secondary | ICD-10-CM | POA: Diagnosis present

## 2025-01-04 DIAGNOSIS — N1832 Chronic kidney disease, stage 3b: Secondary | ICD-10-CM | POA: Diagnosis present

## 2025-01-04 DIAGNOSIS — Z1152 Encounter for screening for COVID-19: Secondary | ICD-10-CM

## 2025-01-04 DIAGNOSIS — H409 Unspecified glaucoma: Secondary | ICD-10-CM | POA: Diagnosis present

## 2025-01-04 DIAGNOSIS — I82531 Chronic embolism and thrombosis of right popliteal vein: Secondary | ICD-10-CM | POA: Diagnosis present

## 2025-01-04 DIAGNOSIS — Z87891 Personal history of nicotine dependence: Secondary | ICD-10-CM

## 2025-01-04 DIAGNOSIS — D649 Anemia, unspecified: Secondary | ICD-10-CM | POA: Diagnosis present

## 2025-01-04 DIAGNOSIS — I447 Left bundle-branch block, unspecified: Secondary | ICD-10-CM | POA: Diagnosis present

## 2025-01-04 DIAGNOSIS — Z90710 Acquired absence of both cervix and uterus: Secondary | ICD-10-CM

## 2025-01-04 DIAGNOSIS — Z9889 Other specified postprocedural states: Secondary | ICD-10-CM

## 2025-01-04 DIAGNOSIS — Z91199 Patient's noncompliance with other medical treatment and regimen due to unspecified reason: Secondary | ICD-10-CM

## 2025-01-04 DIAGNOSIS — E1159 Type 2 diabetes mellitus with other circulatory complications: Secondary | ICD-10-CM

## 2025-01-04 DIAGNOSIS — Z955 Presence of coronary angioplasty implant and graft: Secondary | ICD-10-CM

## 2025-01-04 LAB — CBC WITH DIFFERENTIAL/PLATELET
Abs Immature Granulocytes: 0.08 10*3/uL — ABNORMAL HIGH (ref 0.00–0.07)
Basophils Absolute: 0 10*3/uL (ref 0.0–0.1)
Basophils Relative: 0 %
Eosinophils Absolute: 0.3 10*3/uL (ref 0.0–0.5)
Eosinophils Relative: 4 %
HCT: 31.6 % — ABNORMAL LOW (ref 36.0–46.0)
Hemoglobin: 9 g/dL — ABNORMAL LOW (ref 12.0–15.0)
Immature Granulocytes: 1 %
Lymphocytes Relative: 20 %
Lymphs Abs: 1.4 10*3/uL (ref 0.7–4.0)
MCH: 24.7 pg — ABNORMAL LOW (ref 26.0–34.0)
MCHC: 28.5 g/dL — ABNORMAL LOW (ref 30.0–36.0)
MCV: 86.6 fL (ref 80.0–100.0)
Monocytes Absolute: 0.4 10*3/uL (ref 0.1–1.0)
Monocytes Relative: 5 %
Neutro Abs: 4.9 10*3/uL (ref 1.7–7.7)
Neutrophils Relative %: 70 %
Platelets: 239 10*3/uL (ref 150–400)
RBC: 3.65 MIL/uL — ABNORMAL LOW (ref 3.87–5.11)
RDW: 16.9 % — ABNORMAL HIGH (ref 11.5–15.5)
WBC: 7 10*3/uL (ref 4.0–10.5)
nRBC: 0 % (ref 0.0–0.2)

## 2025-01-04 LAB — BASIC METABOLIC PANEL WITH GFR
Anion gap: 12 (ref 5–15)
BUN: 26 mg/dL — ABNORMAL HIGH (ref 8–23)
CO2: 26 mmol/L (ref 22–32)
Calcium: 7.5 mg/dL — ABNORMAL LOW (ref 8.9–10.3)
Chloride: 110 mmol/L (ref 98–111)
Creatinine, Ser: 1.29 mg/dL — ABNORMAL HIGH (ref 0.44–1.00)
GFR, Estimated: 46 mL/min — ABNORMAL LOW
Glucose, Bld: 141 mg/dL — ABNORMAL HIGH (ref 70–99)
Potassium: 4.4 mmol/L (ref 3.5–5.1)
Sodium: 148 mmol/L — ABNORMAL HIGH (ref 135–145)

## 2025-01-04 LAB — PRO BRAIN NATRIURETIC PEPTIDE: Pro Brain Natriuretic Peptide: 3363 pg/mL — ABNORMAL HIGH

## 2025-01-04 LAB — TROPONIN T, HIGH SENSITIVITY: Troponin T High Sensitivity: 66 ng/L — ABNORMAL HIGH (ref 0–19)

## 2025-01-04 MED ORDER — FUROSEMIDE 10 MG/ML IJ SOLN
80.0000 mg | Freq: Once | INTRAMUSCULAR | Status: AC
  Administered 2025-01-04: 80 mg via INTRAVENOUS
  Filled 2025-01-04: qty 8

## 2025-01-04 NOTE — ED Triage Notes (Signed)
 Pt via [facility] c/o SOB and N/V that started yesterday. Hx CHF and recently discharged. Pt has not been able to get up in 2 days so not able to take regular medications.

## 2025-01-04 NOTE — ED Provider Notes (Signed)
 "  [facility] AT [facility]  Provider Note  CSN: [id number] Arrival date & time: 01/04/25 2246  History Chief Complaint  Patient presents with   Shortness of Breath    Maureen Jackson is a 69 y.o. 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 She was admitted 1/20 for CHF exacerbation and discharged 1/21 after diuresis. She reports she has gotten worse since getting home, unable to walk to her kitchen to take her medications due to SOB. She reports dry cough, no fever.    Home Medications Prior to Admission medications  Medication Sig Start Date End Date Taking? Authorizing Provider  acetaminophen  (TYLENOL ) 325 MG tablet Take 2 tablets (650 mg total) by mouth every 4 (four) hours as needed for mild pain, fever or headache. 05/25/20   Pearlean Manus, MD  apixaban  (ELIQUIS ) 5 MG TABS tablet Take 1 tablet (5 mg total) by mouth 2 (two) times daily. 12/31/24   Pearlean Manus, MD  atorvastatin  (LIPITOR ) 80 MG tablet Take 1 tablet (80 mg total) by mouth daily. 12/31/24   Pearlean Manus, MD  Blood Pressure Monitoring (OMRON 3 SERIES BP MONITOR) DEVI Use as directed 08/27/23   Miriam Norris, NP  calcium  carbonate (TUMS - DOSED IN MG ELEMENTAL CALCIUM ) 500 MG chewable tablet Chew 4 tablets by mouth 2 (two) times daily.    [provider]  COMBIGAN  0.2-0.5 % ophthalmic solution Place 1 drop into both eyes 2 (two) times daily. 02/08/22   [provider]  dorzolamide  (TRUSOPT ) 2 % ophthalmic solution Place 1 drop into the left eye 2 (two) times daily. 10/16/24   Franklyn Sid LOISE, MD  ENTRESTO  24-26 MG Take 1 tablet by mouth 2 (two) times daily. 12/31/24   Pearlean Manus, MD  furosemide  (LASIX ) 40 MG tablet Take 1 tablet (40 mg total) by mouth 2 (two) times daily. May take extra as directed by Heart Failure Clinic. 12/31/24   Pearlean Manus, MD  glucose blood  (ONETOUCH VERIO) test strip Test glucose 4 times day. Patient not taking: Reported on 11/04/2024 04/19/22   Nida, Gebreselassie W, MD  JARDIANCE  25 MG TABS tablet Take 25 mg by mouth daily. 07/21/24   [provider]  latanoprost  (XALATAN ) 0.005 % ophthalmic solution Place 1 drop into the left eye at bedtime. 10/16/24   Franklyn Sid LOISE, MD  levothyroxine  (SYNTHROID ) 200 MCG tablet Take 200 mcg by mouth daily before breakfast. Take 1 tablet with 50mcg for a total dose of 250mcg daily    [provider]  levothyroxine  (SYNTHROID ) 50 MCG tablet Take 1.5 tablets (75 mcg total) by mouth daily before breakfast. Take 1 tablet with 200mcg for a total dose of 275mcg daily 09/20/24   Bryn Bernardino NOVAK, MD  metoprolol  succinate (TOPROL -XL) 25 MG 24 hr tablet Take 1 tablet (25 mg total) by mouth daily. 12/31/24   Emokpae, Courage, MD  OZEMPIC, 0.25 OR 0.5 MG/DOSE, 2 MG/3ML SOPN Inject 0.5 mg into the skin once a week. Fridays 05/03/23   [provider]  prednisoLONE  acetate (PRED FORTE ) 1 % ophthalmic suspension Place 1 drop into the left eye 4 (four) times daily. 11/07/24   [provider]  promethazine -dextromethorphan  (PROMETHAZINE -DM) 6.25-15 MG/5ML syrup Take 5 mLs by mouth every 4 (four) hours as needed. 10/12/24   [provider]  senna-docusate (SENOKOT-S) 8.6-50 MG tablet Take 2 tablets by mouth 2 (two)  times daily. Patient taking differently: Take 2 tablets by mouth at bedtime as needed for mild constipation. 05/25/20   Pearlean Manus, MD  VENTOLIN  HFA 108 (90 Base) MCG/ACT inhaler Inhale 1-2 puffs into the lungs every 6 (six) hours as needed for wheezing or shortness of breath. 12/31/24   Pearlean Manus, MD     Allergies    Ampicillin , Aspirin , Hydrocodone , and Unasyn  [ampicillin -sulbactam sodium ]   Review of Systems   Review of Systems Please see HPI for pertinent positives and negatives  Physical Exam BP 125/81   Pulse 94   Temp 98 F (36.7 C) (Oral)    Resp (!) 22   SpO2 98%   Physical Exam Vitals and nursing note reviewed.  Constitutional:      Appearance: Normal appearance.  HENT:     Head: Normocephalic and atraumatic.     Nose: Nose normal.     Mouth/Throat:     Mouth: Mucous membranes are moist.  Eyes:     Extraocular Movements: Extraocular movements intact.     Conjunctiva/sclera: Conjunctivae normal.  Cardiovascular:     Rate and Rhythm: Normal rate.  Pulmonary:     Effort: Pulmonary effort is normal.     Breath sounds: Wheezing and rales present.  Abdominal:     General: Abdomen is flat.     Palpations: Abdomen is soft.     Tenderness: There is no abdominal tenderness.  Musculoskeletal:        General: No swelling. Normal range of motion.     Cervical back: Neck supple.     Right lower leg: Edema present.     Left lower leg: Edema present.  Skin:    General: Skin is warm and dry.  Neurological:     General: No focal deficit present.     Mental Status: She is alert.  Psychiatric:        Mood and Affect: Mood normal.     ED Results / Procedures / Treatments   EKG EKG Interpretation Date/Time:  Sunday January 04 2025 23:22:53 EST Ventricular Rate:  88 PR Interval:  149 QRS Duration:  168 QT Interval:  448 QTC Calculation: 543 R Axis:   66  Text Interpretation: Sinus rhythm Left bundle branch block No significant change since last tracing Confirmed by Roselyn Dunnings [phone]) on 01/04/2025 11:23:56 PM  Procedures .Critical Care  Performed by: Roselyn Dunnings NOVAK, MD Authorized by: Roselyn Dunnings NOVAK, MD   Critical care provider statement:    Critical care time (minutes):  30   Critical care was necessary to treat or prevent imminent or life-threatening deterioration of the following conditions:  Respiratory failure and cardiac failure   Critical care was time spent personally by me on the following activities:  Development of treatment plan with patient or surrogate, discussions with consultants,  evaluation of patient's response to treatment, examination of patient, ordering and review of laboratory studies, ordering and review of radiographic studies, ordering and performing treatments and interventions, pulse oximetry, re-evaluation of patient's condition and review of old charts   Care discussed with: admitting provider     Medications Ordered in the ED Medications  furosemide  (LASIX ) injection 80 mg (80 mg Intravenous Given 01/04/25 2358)    Initial Impression and Plan  Patient here with increasing SOB since recent discharge. EMS reports SpO2 at rest on RA 93%, symptoms improved with 4LNC. Vitals are reassuring. Exam consistent with CHF and/or URI. Will check labs, CXR, EKG.   ED Course   Clinical  Course as of 01/05/25 0048  [place] Jan 04, 2025  2351 CBC with anemia at baseline.  [CS]  2354 I personally viewed the images from radiology studies and agree with radiologist interpretation: CXR with cardiomegaly. Will give lasix  for diuresis. [CS]  2356 BNP more elevated than previous admission. Trop is improved. BMP with CKD, improved from recent admission. Will consult hospitalist to admit.  [CS]  Mon Jan 05, 2025  0023 Covid/Flu/RSV swab is neg.  [CS]  [phone] Patient with Dr. Manfred, Hospitalist, who will evaluate for admission.  [CS]    Clinical Course User Index [CS] Roselyn Carlin NOVAK, MD     MDM Rules/Calculators/A&P Medical Decision Making Problems Addressed: Acute and chronic respiratory failure with hypoxia [facility]): chronic illness or injury with exacerbation, progression, or side effects of treatment Acute on chronic congestive heart failure, unspecified heart failure type [facility]): chronic illness or injury with exacerbation, progression, or side effects of treatment  Amount and/or Complexity of Data Reviewed Labs: ordered. Radiology: ordered.  Risk Prescription drug management. Decision regarding hospitalization.     Final Clinical Impression(s) / ED  Diagnoses Final diagnoses:  Acute on chronic congestive heart failure, unspecified heart failure type (HCC)  Acute and chronic respiratory failure with hypoxia [facility])    Rx / DC Orders ED Discharge Orders     None        Roselyn Carlin NOVAK, MD 01/05/25 [phone]  "

## 2025-01-05 DIAGNOSIS — D649 Anemia, unspecified: Secondary | ICD-10-CM | POA: Diagnosis present

## 2025-01-05 DIAGNOSIS — I3139 Other pericardial effusion (noninflammatory): Secondary | ICD-10-CM | POA: Diagnosis present

## 2025-01-05 DIAGNOSIS — I447 Left bundle-branch block, unspecified: Secondary | ICD-10-CM

## 2025-01-05 DIAGNOSIS — Z7901 Long term (current) use of anticoagulants: Secondary | ICD-10-CM | POA: Diagnosis not present

## 2025-01-05 DIAGNOSIS — I255 Ischemic cardiomyopathy: Secondary | ICD-10-CM | POA: Diagnosis present

## 2025-01-05 DIAGNOSIS — I2510 Atherosclerotic heart disease of native coronary artery without angina pectoris: Secondary | ICD-10-CM | POA: Diagnosis present

## 2025-01-05 DIAGNOSIS — J9621 Acute and chronic respiratory failure with hypoxia: Secondary | ICD-10-CM | POA: Diagnosis present

## 2025-01-05 DIAGNOSIS — N179 Acute kidney failure, unspecified: Secondary | ICD-10-CM | POA: Diagnosis present

## 2025-01-05 DIAGNOSIS — E039 Hypothyroidism, unspecified: Secondary | ICD-10-CM | POA: Diagnosis present

## 2025-01-05 DIAGNOSIS — I5043 Acute on chronic combined systolic (congestive) and diastolic (congestive) heart failure: Secondary | ICD-10-CM

## 2025-01-05 DIAGNOSIS — E870 Hyperosmolality and hypernatremia: Secondary | ICD-10-CM | POA: Diagnosis present

## 2025-01-05 DIAGNOSIS — I130 Hypertensive heart and chronic kidney disease with heart failure and stage 1 through stage 4 chronic kidney disease, or unspecified chronic kidney disease: Secondary | ICD-10-CM | POA: Diagnosis present

## 2025-01-05 DIAGNOSIS — N1831 Chronic kidney disease, stage 3a: Secondary | ICD-10-CM | POA: Diagnosis not present

## 2025-01-05 DIAGNOSIS — R7989 Other specified abnormal findings of blood chemistry: Secondary | ICD-10-CM

## 2025-01-05 DIAGNOSIS — Z6841 Body Mass Index (BMI) 40.0 and over, adult: Secondary | ICD-10-CM | POA: Diagnosis not present

## 2025-01-05 DIAGNOSIS — E1151 Type 2 diabetes mellitus with diabetic peripheral angiopathy without gangrene: Secondary | ICD-10-CM | POA: Diagnosis present

## 2025-01-05 DIAGNOSIS — J9601 Acute respiratory failure with hypoxia: Secondary | ICD-10-CM

## 2025-01-05 DIAGNOSIS — H409 Unspecified glaucoma: Secondary | ICD-10-CM | POA: Diagnosis present

## 2025-01-05 DIAGNOSIS — E782 Mixed hyperlipidemia: Secondary | ICD-10-CM | POA: Diagnosis present

## 2025-01-05 DIAGNOSIS — E1122 Type 2 diabetes mellitus with diabetic chronic kidney disease: Secondary | ICD-10-CM | POA: Diagnosis present

## 2025-01-05 DIAGNOSIS — N1832 Chronic kidney disease, stage 3b: Secondary | ICD-10-CM | POA: Diagnosis present

## 2025-01-05 DIAGNOSIS — M069 Rheumatoid arthritis, unspecified: Secondary | ICD-10-CM | POA: Diagnosis present

## 2025-01-05 DIAGNOSIS — I82531 Chronic embolism and thrombosis of right popliteal vein: Secondary | ICD-10-CM | POA: Diagnosis present

## 2025-01-05 DIAGNOSIS — Z1152 Encounter for screening for COVID-19: Secondary | ICD-10-CM | POA: Diagnosis not present

## 2025-01-05 DIAGNOSIS — G4733 Obstructive sleep apnea (adult) (pediatric): Secondary | ICD-10-CM | POA: Diagnosis present

## 2025-01-05 DIAGNOSIS — E1165 Type 2 diabetes mellitus with hyperglycemia: Secondary | ICD-10-CM

## 2025-01-05 DIAGNOSIS — I2489 Other forms of acute ischemic heart disease: Secondary | ICD-10-CM | POA: Diagnosis present

## 2025-01-05 LAB — CBC
HCT: 32.2 % — ABNORMAL LOW (ref 36.0–46.0)
Hemoglobin: 9.1 g/dL — ABNORMAL LOW (ref 12.0–15.0)
MCH: 24.7 pg — ABNORMAL LOW (ref 26.0–34.0)
MCHC: 28.3 g/dL — ABNORMAL LOW (ref 30.0–36.0)
MCV: 87.5 fL (ref 80.0–100.0)
Platelets: 241 10*3/uL (ref 150–400)
RBC: 3.68 MIL/uL — ABNORMAL LOW (ref 3.87–5.11)
RDW: 17 % — ABNORMAL HIGH (ref 11.5–15.5)
WBC: 7.7 10*3/uL (ref 4.0–10.5)
nRBC: 0 % (ref 0.0–0.2)

## 2025-01-05 LAB — PHOSPHORUS: Phosphorus: 3.3 mg/dL (ref 2.5–4.6)

## 2025-01-05 LAB — RESP PANEL BY RT-PCR (RSV, FLU A&B, COVID)  RVPGX2
Influenza A by PCR: NEGATIVE
Influenza B by PCR: NEGATIVE
Resp Syncytial Virus by PCR: NEGATIVE
SARS Coronavirus 2 by RT PCR: NEGATIVE

## 2025-01-05 LAB — TROPONIN T, HIGH SENSITIVITY
Troponin T High Sensitivity: 76 ng/L — ABNORMAL HIGH (ref 0–19)
Troponin T High Sensitivity: 75 ng/L — ABNORMAL HIGH (ref 0–19)
Troponin T High Sensitivity: 81 ng/L — ABNORMAL HIGH (ref 0–19)

## 2025-01-05 LAB — COMPREHENSIVE METABOLIC PANEL WITH GFR
ALT: 37 U/L (ref 0–44)
AST: 36 U/L (ref 15–41)
Albumin: 3.7 g/dL (ref 3.5–5.0)
Alkaline Phosphatase: 331 U/L — ABNORMAL HIGH (ref 38–126)
Anion gap: 11 (ref 5–15)
BUN: 26 mg/dL — ABNORMAL HIGH (ref 8–23)
CO2: 28 mmol/L (ref 22–32)
Calcium: 7.7 mg/dL — ABNORMAL LOW (ref 8.9–10.3)
Chloride: 109 mmol/L (ref 98–111)
Creatinine, Ser: 1.45 mg/dL — ABNORMAL HIGH (ref 0.44–1.00)
GFR, Estimated: 40 mL/min — ABNORMAL LOW
Glucose, Bld: 124 mg/dL — ABNORMAL HIGH (ref 70–99)
Potassium: 4.4 mmol/L (ref 3.5–5.1)
Sodium: 148 mmol/L — ABNORMAL HIGH (ref 135–145)
Total Bilirubin: 0.9 mg/dL (ref 0.0–1.2)
Total Protein: 7.5 g/dL (ref 6.5–8.1)

## 2025-01-05 LAB — GLUCOSE, CAPILLARY
Glucose-Capillary: 132 mg/dL — ABNORMAL HIGH (ref 70–99)
Glucose-Capillary: 128 mg/dL — ABNORMAL HIGH (ref 70–99)

## 2025-01-05 LAB — CBG MONITORING, ED
Glucose-Capillary: 110 mg/dL — ABNORMAL HIGH (ref 70–99)
Glucose-Capillary: 152 mg/dL — ABNORMAL HIGH (ref 70–99)

## 2025-01-05 LAB — MAGNESIUM: Magnesium: 2.5 mg/dL — ABNORMAL HIGH (ref 1.7–2.4)

## 2025-01-05 MED ORDER — SACUBITRIL-VALSARTAN 24-26 MG PO TABS
1.0000 | ORAL_TABLET | Freq: Two times a day (BID) | ORAL | Status: DC
  Administered 2025-01-05 – 2025-01-06 (×4): 1 via ORAL
  Filled 2025-01-05 (×4): qty 1

## 2025-01-05 MED ORDER — ATORVASTATIN CALCIUM 40 MG PO TABS
80.0000 mg | ORAL_TABLET | Freq: Every day | ORAL | Status: DC
  Administered 2025-01-05 – 2025-01-08 (×4): 80 mg via ORAL
  Filled 2025-01-05 (×4): qty 2

## 2025-01-05 MED ORDER — BRIMONIDINE TARTRATE-TIMOLOL 0.2-0.5 % OP SOLN
1.0000 [drp] | Freq: Two times a day (BID) | OPHTHALMIC | Status: DC
  Filled 2025-01-05: qty 5

## 2025-01-05 MED ORDER — ACETAMINOPHEN 325 MG PO TABS
650.0000 mg | ORAL_TABLET | Freq: Four times a day (QID) | ORAL | Status: DC | PRN
  Administered 2025-01-07: 650 mg via ORAL
  Filled 2025-01-05: qty 2

## 2025-01-05 MED ORDER — APIXABAN 5 MG PO TABS
5.0000 mg | ORAL_TABLET | Freq: Two times a day (BID) | ORAL | Status: DC
  Administered 2025-01-05 – 2025-01-08 (×7): 5 mg via ORAL
  Filled 2025-01-05 (×7): qty 1

## 2025-01-05 MED ORDER — INSULIN ASPART 100 UNIT/ML IJ SOLN
0.0000 [IU] | Freq: Three times a day (TID) | INTRAMUSCULAR | Status: DC
  Administered 2025-01-05: 2 [IU] via SUBCUTANEOUS
  Administered 2025-01-05: 3 [IU] via SUBCUTANEOUS
  Administered 2025-01-06 – 2025-01-07 (×2): 2 [IU] via SUBCUTANEOUS
  Administered 2025-01-07 – 2025-01-08 (×3): 3 [IU] via SUBCUTANEOUS
  Administered 2025-01-08: 2 [IU] via SUBCUTANEOUS
  Filled 2025-01-05 (×8): qty 1

## 2025-01-05 MED ORDER — BRIMONIDINE TARTRATE 0.2 % OP SOLN
1.0000 [drp] | Freq: Two times a day (BID) | OPHTHALMIC | Status: DC
  Administered 2025-01-05 – 2025-01-08 (×7): 1 [drp] via OPHTHALMIC
  Filled 2025-01-05 (×2): qty 5

## 2025-01-05 MED ORDER — PROCHLORPERAZINE EDISYLATE 10 MG/2ML IJ SOLN
10.0000 mg | Freq: Four times a day (QID) | INTRAMUSCULAR | Status: DC | PRN
  Administered 2025-01-07: 10 mg via INTRAVENOUS
  Filled 2025-01-05: qty 2

## 2025-01-05 MED ORDER — LATANOPROST 0.005 % OP SOLN
1.0000 [drp] | Freq: Every day | OPHTHALMIC | Status: DC
  Administered 2025-01-07: 1 [drp] via OPHTHALMIC
  Filled 2025-01-05: qty 2.5

## 2025-01-05 MED ORDER — FUROSEMIDE 10 MG/ML IJ SOLN
60.0000 mg | Freq: Two times a day (BID) | INTRAMUSCULAR | Status: DC
  Administered 2025-01-05 – 2025-01-06 (×4): 60 mg via INTRAVENOUS
  Filled 2025-01-05 (×4): qty 6

## 2025-01-05 MED ORDER — ONDANSETRON HCL 4 MG/2ML IJ SOLN
4.0000 mg | Freq: Four times a day (QID) | INTRAMUSCULAR | Status: DC | PRN
  Administered 2025-01-06: 4 mg via INTRAVENOUS
  Filled 2025-01-05: qty 2

## 2025-01-05 MED ORDER — LEVOTHYROXINE SODIUM 75 MCG PO TABS
75.0000 ug | ORAL_TABLET | Freq: Every day | ORAL | Status: DC
  Administered 2025-01-05 – 2025-01-08 (×4): 75 ug via ORAL
  Filled 2025-01-05 (×3): qty 1
  Filled 2025-01-05: qty 2

## 2025-01-05 MED ORDER — ONDANSETRON HCL 4 MG PO TABS
4.0000 mg | ORAL_TABLET | Freq: Four times a day (QID) | ORAL | Status: DC | PRN
  Administered 2025-01-05: 4 mg via ORAL
  Filled 2025-01-05: qty 1

## 2025-01-05 MED ORDER — DORZOLAMIDE HCL 2 % OP SOLN
1.0000 [drp] | Freq: Two times a day (BID) | OPHTHALMIC | Status: DC
  Administered 2025-01-05 – 2025-01-08 (×7): 1 [drp] via OPHTHALMIC
  Filled 2025-01-05: qty 10

## 2025-01-05 MED ORDER — ACETAMINOPHEN 650 MG RE SUPP: 650.0000 mg | Freq: Four times a day (QID) | RECTAL | Status: DC | PRN

## 2025-01-05 MED ORDER — LEVOTHYROXINE SODIUM 50 MCG PO TABS: 75.0000 ug | ORAL_TABLET | Freq: Every day | ORAL | Status: DC

## 2025-01-05 MED ORDER — LEVOTHYROXINE SODIUM 50 MCG PO TABS: 200.0000 ug | ORAL_TABLET | Freq: Every day | ORAL | Status: DC

## 2025-01-05 MED ORDER — PREDNISOLONE ACETATE 1 % OP SUSP
1.0000 [drp] | Freq: Four times a day (QID) | OPHTHALMIC | Status: DC
  Administered 2025-01-05 – 2025-01-08 (×14): 1 [drp] via OPHTHALMIC
  Filled 2025-01-05: qty 1

## 2025-01-05 MED ORDER — LEVOTHYROXINE SODIUM 100 MCG PO TABS
200.0000 ug | ORAL_TABLET | Freq: Every day | ORAL | Status: DC
  Administered 2025-01-05 – 2025-01-08 (×4): 200 ug via ORAL
  Filled 2025-01-05 (×2): qty 2
  Filled 2025-01-05: qty 4
  Filled 2025-01-05: qty 2

## 2025-01-05 MED ORDER — TIMOLOL MALEATE 0.5 % OP SOLN
1.0000 [drp] | Freq: Two times a day (BID) | OPHTHALMIC | Status: DC
  Administered 2025-01-05 – 2025-01-08 (×7): 1 [drp] via OPHTHALMIC
  Filled 2025-01-05: qty 5

## 2025-01-05 NOTE — TOC Initial Note (Signed)
 Transition of Care [facility]) - Initial/Assessment Note    Patient Details  Name: Maureen Jackson MRN: [id number] Date of Birth: 1956/08/18  Transition of Care [facility]) CM/SW Contact:    Hoy DELENA Bigness, LCSW Phone Number: 01/05/2025, 2:16 PM  Clinical Narrative:                 Pt high risk for readmission. Pt recently discharged from the hospital on 12/21 w/ HH PT/RN arranged with [facility]. Pt lives at home alone and has a personal care aide 5hrs/4days a week. Pt has a RW and cane at home. Pt utilizes [facility] for transportation needs. ICM will continue to follow for discharge needs.   Expected Discharge Plan: Skilled Nursing Facility (TBD) Barriers to Discharge: Continued Medical Work up   Patient Goals and CMS Choice Patient states their goals for this hospitalization and ongoing recovery are:: To feel better          Expected Discharge Plan and Services In-house Referral: Clinical Social Work Discharge Planning Services: CM Consult Post Acute Care Choice: Skilled Nursing Facility, Home Health Living arrangements for the past 2 months: Apartment                                      Prior Living Arrangements/Services Living arrangements for the past 2 months: Apartment Lives with:: Self Patient language and need for interpreter reviewed:: Yes Do you feel safe going back to the place where you live?: Yes      Need for Family Participation in Patient Care: No (Comment) Care giver support system in place?: Yes (comment) Current home services: DME, Homehealth aide, Home PT, Home RN (RW, cane, personal care aide, HHPT/RN  w/ [facility]) Criminal Activity/Legal Involvement Pertinent to Current Situation/Hospitalization: No - Comment as needed  Activities of Daily Living   ADL Screening (condition at time of admission) Independently performs ADLs?: No Does the patient have a NEW difficulty with bathing/dressing/toileting/self-feeding that is expected to last >3 days?: Yes  (Initiates electronic notice to provider for possible OT consult) Does the patient have a NEW difficulty with getting in/out of bed, walking, or climbing stairs that is expected to last >3 days?: Yes (Initiates electronic notice to provider for possible PT consult) Does the patient have a NEW difficulty with communication that is expected to last >3 days?: No Is the patient deaf or have difficulty hearing?: No Does the patient have difficulty seeing, even when wearing glasses/contacts?: No Does the patient have difficulty concentrating, remembering, or making decisions?: No  Permission Sought/Granted Permission sought to share information with : Oceanographer granted to share information with : Yes, Verbal Permission Granted              Emotional Assessment Appearance:: Appears stated age     Orientation: : Oriented to Self, Oriented to Place, Oriented to  Time, Oriented to Situation Alcohol  / Substance Use: Not Applicable Psych Involvement: No (comment)  Admission diagnosis:  Acute and chronic respiratory failure with hypoxia (HCC) [J96.21] Acute on chronic combined systolic and diastolic CHF (congestive heart failure) (HCC) [I50.43] Acute on chronic congestive heart failure, unspecified heart failure type (HCC) [I50.9] Patient Active Problem List   Diagnosis Date Noted   Acute on chronic systolic (congestive) heart failure (HCC) 12/30/2024   AKI (acute kidney injury) 09/17/2024   Acute bacterial conjunctivitis of left eye 09/15/2024   Prolonged QT interval 09/15/2024   Chronic  kidney disease, stage 3b (HCC) 09/15/2024   Type 2 diabetes mellitus with hyperglycemia (HCC) 09/15/2024   History of DVT (deep vein thrombosis) 09/15/2024   Anemia, iron  deficiency 08/18/2024   Acute DVT (deep venous thrombosis) (HCC) 05/20/2024   Acute on chronic systolic CHF (congestive heart failure) (HCC) 05/16/2024   Cardiac tamponade 12/21/2022   Acute on chronic  combined systolic and diastolic CHF (congestive heart failure) (HCC) 12/20/2022   Acute on chronic combined systolic (congestive) and diastolic (congestive) heart failure (HCC) 05/18/2020   Acute respiratory disease due to COVID-19 virus 12/26/2019   Pressure injury of skin 09/21/2019   Acute renal failure superimposed on stage 3b chronic kidney disease (HCC)    Drug rash    Abscess    Fistula    Fournier's gangrene in female [facility])    Chest pain 07/16/2018   Mixed hyperlipidemia 04/10/2018   TIA (transient ischemic attack) 03/14/2018   Vertigo 03/14/2018   Chronic combined systolic and diastolic CHF (congestive heart failure) (EF 30 to 35 %) 03/14/2018   Ischemic cardiomyopathy 11/06/2017   Coronary artery disease involving native coronary artery of native heart without angina pectoris    Pericardial effusion 09/07/2017   Elevated troponin 09/07/2017   Acute combined systolic (congestive) and diastolic (congestive) heart failure (HCC) 09/06/2017   Cardiomegaly 09/05/2017   Morbid obesity/BMI > 55 03/04/2013   Labyrinthitis 03/04/2013   DM type 2 causing vascular disease (HCC) 03/04/2013   GASTRITIS 12/13/2006   HIATAL HERNIA, HX OF 12/13/2006   S/P thyroidectomy 12/13/2006   Acquired hypothyroidism 10/04/2006   TOBACCO ABUSE 10/04/2006   CARPAL TUNNEL SYNDROME 10/04/2006   Essential hypertension, benign 10/04/2006   GERD 10/04/2006   POSTMENOPAUSAL STATUS 10/04/2006   SKIN TAG 10/04/2006   KNEE PAIN, LEFT 10/04/2006   Sleep apnea 10/04/2006   LEG EDEMA 10/04/2006   PCP:  Orpha Yancey LABOR, MD Pharmacy:   [facility] [phone] - [place], [place] - [address] AT [address] [place] [address] Phone: [phone] Fax: [phone]     Social Drivers of Health (SDOH) Social History: SDOH Screenings   Food Insecurity: No Food Insecurity (01/05/2025)  Housing: Low Risk (01/05/2025)  Transportation Needs: No  Transportation Needs (01/05/2025)  Utilities: Not At Risk (01/05/2025)  Alcohol  Screen: Low Risk (12/22/2022)  Financial Resource Strain: Low Risk (05/19/2024)  Social Connections: Moderately Integrated (01/05/2025)  Tobacco Use: Medium Risk (01/04/2025)   SDOH Interventions:     Readmission Risk Interventions    01/05/2025    2:13 PM 09/19/2024    3:34 PM 09/15/2024   11:39 AM  Readmission Risk Prevention Plan  Transportation Screening Complete Complete Complete  HRI or Home Care Consult  Complete Complete  Social Work Consult for Recovery Care Planning/Counseling  Complete Complete  Palliative Care Screening  Not Applicable Not Applicable  Medication Review Oceanographer) Complete Complete Complete  PCP or Specialist appointment within 3-5 days of discharge Complete    HRI or Home Care Consult Complete    SW Recovery Care/Counseling Consult Complete    Palliative Care Screening Not Applicable

## 2025-01-05 NOTE — Progress Notes (Signed)
 OT Cancellation Note  Patient Details Name: Maureen Jackson MRN: [id number] DOB: 12-02-1956   Cancelled Treatment:    Reason Eval/Treat Not Completed: Other (comment). Pt refused mobility at this time. Purpose of therapy was explained but pt continued to decline mobility for evaluation. Will attempt evaluation later as time permits.   [name] OT, MOT   [name] 01/05/2025, 11:15 AM

## 2025-01-05 NOTE — H&P (Addendum)
 " History and Physical    Patient: Maureen Jackson FMW:[id number] DOB: 12-30-1955 DOA: 01/04/2025 DOS: the patient was seen and examined on 01/05/2025 PCP: Orpha Yancey LABOR, MD  Patient coming from: Home  Chief Complaint:  Chief Complaint  Patient presents with   Shortness of Breath   HPI: ANGELLE Jackson is a 69 y.o. 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  She states that since returning home after being discharged 5 days ago, shortness of breath has progressively worsened despite being compliant with her medications.  Today, shortness of breath worsened to the extent that she had difficulty in being able to ambulate to the kitchen to take her medications.  She endorsed dry cough, but denies fever.  Patient was recently admitted from 1/20 through 12/31/2024 due to acute on chronic combined systolic and diastolic CHF secondary to diuretics and diet noncompliance and was treated with IV Lasix .  ED course In the Emergency Department, she was hemodynamically stable.  Workup in the ED showed normocytic anemia, hypernatremia, troponin 66 (this was 75 on 1/20), proBNP 3,363 (this was 2,009). Chest x-ray showed moderate cardiomegaly with globular configuration of the cardiac silhouette, suggestive of underlying pericardial effusion. She was treated with IV Lasix  80 mg x 1, TRH was asked to admit patient    Review of Systems: As mentioned in the history of present illness. All other systems reviewed and are negative. 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 multivessel CAD by cath in 09/2017 and not felt to be a CABG candidate --> underwent two-vessel PCI with DES to the LAD and DES to the RCA   Diabetes mellitus    Diabetes mellitus, type II (HCC)    Dyspnea    Glaucoma    Hypertension    Left bundle branch block    Morbid obesity (HCC)    Obstructive sleep apnea    does not wear CPAP   Thyroid  disease    Past Surgical History:  Procedure Laterality Date   ABDOMINAL HYSTERECTOMY     CARDIAC CATHETERIZATION  09/12/2017   CORONARY STENT INTERVENTION  09/12/2017   STENT RESOLUTE ONYX 7.24K61 drug eluting stent was successfully placed   CORONARY STENT INTERVENTION N/A 09/12/2017   Procedure: CORONARY STENT INTERVENTION;  Surgeon: Anner Alm ORN, MD;  Location: [facility] INVASIVE CV LAB;  Service: Cardiovascular;  Laterality: N/A;   INCISION AND DRAINAGE PERIRECTAL ABSCESS Left 09/16/2019   Procedure: IRRIGATION AND DEBRIDEMENT LABIA ABSCESS;  Surgeon: Vernetta Berg, MD;  Location: [facility] OR;  Service: General;  Laterality: Left;   IR FLUORO GUIDE CV LINE RIGHT  09/16/2019   IR US  GUIDE VASC ACCESS RIGHT  09/16/2019   LEFT HEART CATH AND CORONARY ANGIOGRAPHY N/A 09/12/2017   Procedure: LEFT HEART CATH AND CORONARY ANGIOGRAPHY;  Surgeon: Anner Alm ORN, MD;  Location: [facility] INVASIVE CV LAB;  Service: Cardiovascular;  Laterality: N/A;   MASS EXCISION N/A 12/18/2018  Procedure: EXCISION TONGUE MASS;  Surgeon: Karis Clunes, MD;  Location: [facility] OR;  Service: ENT;  Laterality: N/A;   PERICARDIOCENTESIS N/A 12/22/2022   Procedure: PERICARDIOCENTESIS;  Surgeon: Wendel Lurena POUR, MD;  Location: [facility] INVASIVE CV LAB;  Service: Cardiovascular;  Laterality: N/A;   RIGHT/LEFT HEART CATH AND CORONARY ANGIOGRAPHY N/A 09/10/2017   Procedure: RIGHT/LEFT HEART CATH AND CORONARY ANGIOGRAPHY;  Surgeon: Burnard Debby LABOR, MD;  Location:  [facility] INVASIVE CV LAB;  Service: Cardiovascular;  Laterality: N/A;   SUBXYPHOID PERICARDIAL WINDOW N/A 12/23/2022   Procedure: SUBXYPHOID PERICARDIAL WINDOW;  Surgeon: Maryjane Mt, MD;  Location: [facility] OR;  Service: Thoracic;  Laterality: N/A;   TEE WITHOUT CARDIOVERSION N/A 12/23/2022   Procedure: TRANSESOPHAGEAL ECHOCARDIOGRAM (TEE);  Surgeon: Maryjane Mt, MD;  Location: [facility] OR;  Service: Thoracic;  Laterality: N/A;   THYROID  SURGERY     Social History:  reports that she quit smoking about 19 years ago. Her smoking use included cigarettes. She has never been exposed to tobacco smoke. She has never used smokeless tobacco. She reports that she does not drink alcohol  and does not use drugs.  Allergies[1]  Family History  Problem Relation Age of Onset   Diabetes Mother    Hypertension Mother    Cancer Mother        pancreas   Hypertension Sister     Prior to Admission medications  Medication Sig Start Date End Date Taking? Authorizing Provider  acetaminophen  (TYLENOL ) 325 MG tablet Take 2 tablets (650 mg total) by mouth every 4 (four) hours as needed for mild pain, fever or headache. 05/25/20   Pearlean Manus, MD  apixaban  (ELIQUIS ) 5 MG TABS tablet Take 1 tablet (5 mg total) by mouth 2 (two) times daily. 12/31/24   Pearlean Manus, MD  atorvastatin  (LIPITOR ) 80 MG tablet Take 1 tablet (80 mg total) by mouth daily. 12/31/24   Pearlean Manus, MD  Blood Pressure Monitoring (OMRON 3 SERIES BP MONITOR) DEVI Use as directed 08/27/23   Miriam Norris, NP  calcium  carbonate (TUMS - DOSED IN MG ELEMENTAL CALCIUM ) 500 MG chewable tablet Chew 4 tablets by mouth 2 (two) times daily.    [provider]  COMBIGAN  0.2-0.5 % ophthalmic solution Place 1 drop into both eyes 2 (two) times daily. 02/08/22   [provider]  dorzolamide  (TRUSOPT ) 2 % ophthalmic solution Place 1 drop into the left eye 2 (two) times daily. 10/16/24   Franklyn Sid SAILOR, MD  ENTRESTO  24-26 MG Take 1 tablet by mouth 2  (two) times daily. 12/31/24   Pearlean Manus, MD  furosemide  (LASIX ) 40 MG tablet Take 1 tablet (40 mg total) by mouth 2 (two) times daily. May take extra as directed by [facility]. 12/31/24   Pearlean Manus, MD  glucose blood (ONETOUCH VERIO) test strip Test glucose 4 times day. Patient not taking: Reported on 11/04/2024 04/19/22   Nida, Gebreselassie W, MD  JARDIANCE  25 MG TABS tablet Take 25 mg by mouth daily. 07/21/24   [provider]  latanoprost  (XALATAN ) 0.005 % ophthalmic solution Place 1 drop into the left eye at bedtime. 10/16/24   Franklyn Sid SAILOR, MD  levothyroxine  (SYNTHROID ) 200 MCG tablet Take 200 mcg by mouth daily before breakfast. Take 1 tablet with 50mcg for a total dose of 250mcg daily    [provider]  levothyroxine  (SYNTHROID ) 50 MCG tablet Take 1.5 tablets (75 mcg total) by mouth daily before breakfast. Take 1 tablet with 200mcg for a total dose of 275mcg daily  09/20/24   Bryn Bernardino NOVAK, MD  metoprolol  succinate (TOPROL -XL) 25 MG 24 hr tablet Take 1 tablet (25 mg total) by mouth daily. 12/31/24   Emokpae, Courage, MD  OZEMPIC, 0.25 OR 0.5 MG/DOSE, 2 MG/3ML SOPN Inject 0.5 mg into the skin once a week. Fridays 05/03/23   [provider]  prednisoLONE  acetate (PRED FORTE ) 1 % ophthalmic suspension Place 1 drop into the left eye 4 (four) times daily. 11/07/24   [provider]  promethazine -dextromethorphan  (PROMETHAZINE -DM) 6.25-15 MG/5ML syrup Take 5 mLs by mouth every 4 (four) hours as needed. 10/12/24   [provider]  senna-docusate (SENOKOT-S) 8.6-50 MG tablet Take 2 tablets by mouth 2 (two) times daily. Patient taking differently: Take 2 tablets by mouth at bedtime as needed for mild constipation. 05/25/20   Pearlean Manus, MD  VENTOLIN  HFA 108 (90 Base) MCG/ACT inhaler Inhale 1-2 puffs into the lungs every 6 (six) hours as needed for wheezing or shortness of breath. 12/31/24   Pearlean Manus, MD    Physical  Exam: Vitals:   01/04/25 2256 01/04/25 2258 01/04/25 2300 01/04/25 2315  BP: (!) 152/82  125/81   Pulse: 95   94  Resp: (!) 22     Temp: 98 F (36.7 C)     TempSrc: Oral     SpO2: 93% 95%  98%   General: Elderly female. Awake and alert and oriented x3. Not in any acute distress.  HEENT: NCAT.  PERRLA. EOMI. Sclerae anicteric.  Moist mucosal membranes. Neck: Neck supple without lymphadenopathy. No carotid bruits. No masses palpated.  Cardiovascular: Regular rate with normal S1-S2 sounds. No murmurs, rubs or gallops auscultated. No JVD.  Respiratory: Mild wheezes in upper lobes with mild rales in the lower lobes bilaterally.  Abdomen: Soft, nontender, nondistended. Active bowel sounds. No masses or hepatosplenomegaly  Skin: No rashes, lesions, or ulcerations.  Dry, warm to touch. Musculoskeletal: 2+ pitting edema in lower extremities bilaterally.  2+ dorsalis pedis and radial pulses.  Psychiatric: Intact judgment and insight.  Mood appropriate to current condition. Neurologic: No focal neurological deficits. Strength is 5/5 x 4.  CN II - XII grossly intact.  Assessment and Plan: Acute on chronic combined systolic and diastolic CHF proBNP 3,363 Chest x-ray showed moderate cardiomegaly and pericardial effusion Continue total input/output, daily weights and fluid restriction She was treated with IV Lasix  80 mg x 1.  Continue IV Lasix  60 mg twice daily Continue home meds Continue heart healthy modified carb diet  EKG personally reviewed showed normal sinus rhythm at rate of 88 bpm with LBBB and QTc of  Echocardiogram done on 09/15/2024 showed EF of 35%.  LV with moderately decreased function.  Mild concentric LVH.  G1 DD  Acute respiratory failure with hypoxia Continue supplemental oxygen  to maintain O2 sats > 92% with plan to wean patient off this as tolerated.  Patient does not use oxygen  at baseline.  Elevated troponin possibly secondary to type II demand ischemia Chronic  LBBB Ischemic cardiomyopathy CAD s/p PCI with stent placement Peripheral artery disease High-sensitivity troponin 66 > 76, she denies chest pain, continue to trend troponin  Type 2 diabetes mellitus with hyperglycemia Hemoglobin A1c on 12/30/2024 was 7.2 Continue ISS and hypoglycemia protocol  CKD 3A Creatinine 1.29 (baseline creatinine at 1.4-1.7) Renally adjust medications, avoid nephrotoxic agents/dehydration/hypotension  Generalized weakness Continue fall precaution Continue PT/OT eval and treat  Essential hypertension Continue Lasix .  Closely monitor vital signs.   Mixed hyperlipidemia Continue Lipitor    Acquired Hypothyroidism  Continue home levothyroxine .   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  Obstructive sleep apnea Patient was noncompliant with home CPAP Continue CPAP if she agrees   Advance Care Planning: Full code  Consults: None  Family Communication: None at bedside  Severity of Illness: The appropriate patient status for this patient is INPATIENT. Inpatient status is judged to be reasonable and necessary in order to provide the required intensity of service to ensure the patient's safety. The patient's presenting symptoms, physical exam findings, and initial radiographic and laboratory data in the context of their chronic comorbidities is felt to place them at high risk for further clinical deterioration. Furthermore, it is not anticipated that the patient will be medically stable for discharge from the hospital within 2 midnights of admission.   * I certify that at the point of admission it is my clinical judgment that the patient will require inpatient hospital care spanning beyond 2 midnights from the point of admission due to high intensity of service, high risk for further deterioration and high frequency of surveillance required.*  Author: Posey Maier, DO 01/05/2025 12:51 AM  For on call review  [email or web address].      [1]  Allergies Allergen Reactions   Ampicillin  Other (See Comments)    Allergic, per [facility]   Aspirin  Nausea Only   Hydrocodone  Other (See Comments)    Allergic, per [facility]   Unasyn  [Ampicillin -Sulbactam Sodium ] Rash and Other (See Comments)    Allergic, per [facility]   "

## 2025-01-05 NOTE — Progress Notes (Signed)
 Patient seen and examined in the ED.  No new complaints.  She feels short of breath especially with activity or walking. Vital signs are stable but she is on 4 L oxygen  via . She is not in any respiratory distress.  She is sitting up in a recliner. Body habitus limits respiratory exam but mild rhonchi was noted on exam. No lower extremity edema noted.  Maureen Jackson is a 69 y.o. 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  She was recently discharged from the hospital on 12/31/2024 for CHF exacerbation.  She presented to the hospital again on 01/04/2025 with shortness of breath.  proBNP 3,363  2D echo in October 2025 showed EF estimated at 30 to 35%, grade 1 diastolic dysfunction, small pericardial effusion.  She was admitted for acute exacerbation of chronic combined systolic and diastolic CHF and acute hypoxic respiratory failure.  Continue IV Lasix . Continue 4 L oxygen  via nasal cannula and wean off oxygen  as able.

## 2025-01-05 NOTE — Progress Notes (Signed)
" °   01/05/25 1950  BiPAP/CPAP/SIPAP  Reason BIPAP/CPAP not in use Non-compliant   Patient declined CPAP for tonight. Stated it suffocates her when she tries to wear it. Patient currently on 2 lpm nasal cannula. No unit in room at this time. "

## 2025-01-05 NOTE — Progress Notes (Signed)
 PT Cancellation Note  Patient Details Name: Maureen Jackson MRN: [id number] DOB: February 05, 1956   Cancelled Treatment:    Reason Eval/Treat Not Completed: Patient declined, no reason specified   4:41 PM, 01/05/25 Lynwood Music, MPT Physical Therapist with [facility] [phone] office [phone] mobile phone

## 2025-01-05 NOTE — Progress Notes (Signed)
 Pt brought to room from ED by wheelchair A&Ox4 when arriving to room stating she needs to use BR and that she did not receive lunch in the ED. She was brought to me with a purwick on due to getting lasix  advised pt we will have to discuss with doctor since she is ambulatory. No questions or concern were brought to my attention at this time. Pt is being left in the chair with call bell within reach.

## 2025-01-06 LAB — CBC WITH DIFFERENTIAL/PLATELET
Abs Immature Granulocytes: 0.02 10*3/uL (ref 0.00–0.07)
Basophils Absolute: 0 10*3/uL (ref 0.0–0.1)
Basophils Relative: 0 %
Eosinophils Absolute: 0.2 10*3/uL (ref 0.0–0.5)
Eosinophils Relative: 4 %
HCT: 32.4 % — ABNORMAL LOW (ref 36.0–46.0)
Hemoglobin: 9.1 g/dL — ABNORMAL LOW (ref 12.0–15.0)
Immature Granulocytes: 0 %
Lymphocytes Relative: 35 %
Lymphs Abs: 2.2 10*3/uL (ref 0.7–4.0)
MCH: 24.9 pg — ABNORMAL LOW (ref 26.0–34.0)
MCHC: 28.1 g/dL — ABNORMAL LOW (ref 30.0–36.0)
MCV: 88.5 fL (ref 80.0–100.0)
Monocytes Absolute: 0.5 10*3/uL (ref 0.1–1.0)
Monocytes Relative: 7 %
Neutro Abs: 3.4 10*3/uL (ref 1.7–7.7)
Neutrophils Relative %: 54 %
Platelets: 246 10*3/uL (ref 150–400)
RBC: 3.66 MIL/uL — ABNORMAL LOW (ref 3.87–5.11)
RDW: 16.8 % — ABNORMAL HIGH (ref 11.5–15.5)
WBC: 6.3 10*3/uL (ref 4.0–10.5)
nRBC: 0 % (ref 0.0–0.2)

## 2025-01-06 LAB — BASIC METABOLIC PANEL WITH GFR
Anion gap: 11 (ref 5–15)
BUN: 35 mg/dL — ABNORMAL HIGH (ref 8–23)
CO2: 27 mmol/L (ref 22–32)
Calcium: 7.6 mg/dL — ABNORMAL LOW (ref 8.9–10.3)
Chloride: 106 mmol/L (ref 98–111)
Creatinine, Ser: 1.69 mg/dL — ABNORMAL HIGH (ref 0.44–1.00)
GFR, Estimated: 33 mL/min — ABNORMAL LOW
Glucose, Bld: 97 mg/dL (ref 70–99)
Potassium: 4.6 mmol/L (ref 3.5–5.1)
Sodium: 144 mmol/L (ref 135–145)

## 2025-01-06 LAB — GLUCOSE, CAPILLARY
Glucose-Capillary: 117 mg/dL — ABNORMAL HIGH (ref 70–99)
Glucose-Capillary: 112 mg/dL — ABNORMAL HIGH (ref 70–99)
Glucose-Capillary: 148 mg/dL — ABNORMAL HIGH (ref 70–99)
Glucose-Capillary: 135 mg/dL — ABNORMAL HIGH (ref 70–99)

## 2025-01-06 NOTE — Evaluation (Signed)
 Occupational Therapy Evaluation Patient Details Name: Maureen Jackson MRN: [id number] DOB: 07-25-1956 Today's Date: 01/06/2025   History of Present Illness   69 y.o. female admitted on 01/04/25 due to increased SOB following discharge from hospital on 12/31/24. 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     Clinical Impressions Pt admitted for concerns listed above. PTA pt reported that she could ambulate short distances at home with her rollator and requires a lot of assist from aide with ADL's. At this time, pt demonstrating increased weakness, requiring mod assist to stand, which increases her work of breath. Pt continues to require mod assist for all BADL's in both sitting and standing due to weakness and balance deficits. OT recommending further skilled OT to maximize independence and reduce caregiver burden. Acute OT will continue to follow.      If plan is discharge home, recommend the following:   A lot of help with walking and/or transfers;A lot of help with bathing/dressing/bathroom;Assistance with cooking/housework;Assist for transportation;Help with stairs or ramp for entrance     Functional Status Assessment   Patient has had a recent decline in their functional status and demonstrates the ability to make significant improvements in function in a reasonable and predictable amount of time.     Equipment Recommendations   BSC/3in1     Recommendations for Other Services         Precautions/Restrictions   Precautions Precautions: Fall Recall of Precautions/Restrictions: Intact Restrictions Weight Bearing Restrictions Per Provider Order: No     Mobility Bed  Mobility               General bed mobility comments: Pt sitting up in chair on arrival    Transfers Overall transfer level: Needs assistance Equipment used: Rolling walker (2 wheels) Transfers: Sit to/from Stand Sit to Stand: Mod assist           General transfer comment: Heavy assist and mod A to power up      Balance Overall balance assessment: Needs assistance Sitting-balance support: No upper extremity supported, Feet supported Sitting balance-Leahy Scale: Good Sitting balance - Comments: seated Edge of chair   Standing balance support: Reliant on assistive device for balance Standing balance-Leahy Scale: Poor Standing balance comment: heavily reliant on RW                           ADL either performed or assessed with clinical judgement   ADL Overall ADL's : Needs assistance/impaired Eating/Feeding: Set up;Sitting   Grooming: Set up;Sitting   Upper Body Bathing: Minimal assistance;Sitting   Lower Body Bathing: Moderate assistance;Sit to/from stand;Sitting/lateral leans   Upper Body Dressing : Minimal assistance;Sitting   Lower Body Dressing: Moderate assistance;Sitting/lateral leans;Sit to/from stand   Toilet Transfer: Moderate assistance;Stand-pivot   Toileting- Clothing Manipulation and Hygiene: Moderate assistance;Sit to/from stand;Sitting/lateral lean         General ADL Comments: Pt most likely near baseline requiring assist with all ADL's due to weakness and balance deificits     Vision Baseline Vision/History: 1 Wears glasses;3 Glaucoma Ability to See in Adequate Light: 1 Impaired Patient Visual Report: No change from baseline Vision Assessment?: No apparent visual deficits     Perception  Praxis         Pertinent Vitals/Pain Pain Assessment Pain Assessment: 0-10 Pain Score: 7  Pain Location: Bottom Pain Descriptors / Indicators: Aching, Constant, Discomfort, Guarding, Grimacing Pain Intervention(s):  Limited activity within patient's tolerance, Monitored during session, Repositioned     Extremity/Trunk Assessment Upper Extremity Assessment Upper Extremity Assessment: Generalized weakness   Lower Extremity Assessment Lower Extremity Assessment: Defer to PT evaluation   Cervical / Trunk Assessment Cervical / Trunk Assessment: Kyphotic   Communication Communication Communication: No apparent difficulties   Cognition Arousal: Alert Behavior During Therapy: WFL for tasks assessed/performed Cognition: No apparent impairments                               Following commands: Intact       Cueing  General Comments   Cueing Techniques: Verbal cues;Visual cues  VSS on 3L   Exercises     Shoulder Instructions      Home Living Family/patient expects to be discharged to:: Private residence Living Arrangements: Alone Available Help at Discharge: Personal care attendant Type of Home: Apartment Home Access: Level entry     Home Layout: One level     Bathroom Shower/Tub: Chief Strategy Officer: Standard     Home Equipment: Grab bars - tub/shower;Cane - single point;Rollator (4 wheels)   Additional Comments: pt reports no change in home set up since last admission, PCA from 12 to 5PM, 4 days a week.      Prior Functioning/Environment Prior Level of Function : Needs assist;History of Falls (last six months)             Mobility Comments: Reports she continues to be a household ambulator with Rollator and SPC. Using [facility] more often because rollator does not lock. ADLs Comments: Pt reports PCA 4 days/week for ADLs and iADLs; pt reports getting a lot of assist for bathing, dressing, and toileting.    OT Problem List: Decreased strength;Decreased range of motion;Decreased activity tolerance;Impaired balance (sitting and/or standing);Cardiopulmonary status limiting activity;Obesity;Impaired UE functional use   OT Treatment/Interventions:  Self-care/ADL training;Therapeutic exercise;Neuromuscular education;Energy conservation;DME and/or AE instruction;Manual therapy;Therapeutic activities;Balance training      OT Goals(Current goals can be found in the care plan section)   Acute Rehab OT Goals Patient Stated Goal: To go home OT Goal Formulation: With patient Time For Goal Achievement: 01/20/25 Potential to Achieve Goals: Good ADL Goals Pt Will Perform Grooming: with contact guard assist;standing Pt Will Perform Lower Body Bathing: with min assist;sitting/lateral leans;sit to/from stand Pt Will Perform Lower Body Dressing: with min assist;sit to/from stand;sitting/lateral leans Pt Will Transfer to Toilet: with contact guard assist;ambulating Pt Will Perform Toileting - Clothing Manipulation and hygiene: with min assist;sitting/lateral leans;sit to/from stand   OT Frequency:  Min 1X/week    Co-evaluation              AM-PAC OT 6 Clicks Daily Activity     Outcome Measure Help from another person eating meals?: A Little Help from another person taking care of personal grooming?: A Little Help from another person toileting, which includes using toliet, bedpan, or urinal?: A Lot Help from another person bathing (including washing, rinsing, drying)?: A Lot Help from another person to put on and taking off regular upper body clothing?: A Little Help from another person to put on and taking off regular lower body clothing?: A Little 6 Click Score: 16   End of Session Equipment  Utilized During Treatment: Gait belt;Rolling walker (2 wheels) Nurse Communication: Mobility status  Activity Tolerance: Patient tolerated treatment well Patient left: in chair;with call bell/phone within reach  OT Visit Diagnosis: Unsteadiness on feet (R26.81);Other abnormalities of gait and mobility (R26.89);Muscle weakness (generalized) (M62.81)                Time: 1110-1131 OT Time Calculation (min): 21 min Charges:  OT General  Charges $OT Visit: 1 Visit OT Evaluation $OT Eval Moderate Complexity: 1 Mod  Deanna Wiater, OTR/L [facility]  Korinna Tat Jillyn Nightingale 01/06/2025, 12:13 PM

## 2025-01-06 NOTE — Progress Notes (Signed)
 " Progress Note    Maureen Jackson   FMW:[id number]  DOB: 1956-06-18  DOA: 01/04/2025     1 PCP: Orpha Yancey LABOR, MD  Initial CC: Shortness of breath  Hospital Course: Maureen Jackson is a 69 y.o. 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   She states that since returning home after being discharged <1 week ago, shortness of breath has progressively worsened despite being compliant with her medications.  Patient was recently admitted from 1/20 through 12/31/2024 due to acute on chronic combined systolic and diastolic CHF secondary to diuretics and diet noncompliance and was treated with IV Lasix .   ED course In the Emergency Department, she was hemodynamically stable.  Workup in the ED showed normocytic anemia, hypernatremia, troponin 66 (this was 75 on 1/20), proBNP 3,363 (this was 2,009). Chest x-ray showed moderate cardiomegaly with globular configuration of the cardiac silhouette, suggestive of underlying pericardial effusion. She was treated with IV Lasix  80 mg x 1, TRH was asked to admit patient   Assessment and Plan:  Acute on chronic combined systolic and diastolic CHF proBNP 3,363 Chest x-ray showed moderate cardiomegaly and pericardial effusion - Continue Lasix  - Continue strict I&O -Follow-up creatinine in the morning, if still increased, likely has been diuresed sufficiently and would discharge   Acute respiratory failure with hypoxia -Not on oxygen  at baseline required increased O2 demand on admission - Has been further weaned, continue weaning and needs eventual walk test  prior to discharge   Elevated troponin possibly secondary to type II demand ischemia Chronic LBBB Ischemic cardiomyopathy CAD s/p PCI with stent placement Peripheral artery disease -Elevated troponin on admission consider demand ischemia -Continue Eliquis , Lipitor , Entresto    Type 2 diabetes mellitus with hyperglycemia Hemoglobin A1c on 12/30/2024 was 7.2 Continue SSI and CBG monitoring   CKD 3A Creatinine 1.29 (baseline creatinine at 1.4-1.7) Renally adjust medications, avoid nephrotoxic agents/dehydration/hypotension   Generalized weakness Continue fall precaution Continue PT/OT eval and treat   Essential hypertension -Continue meds as noted above   Mixed hyperlipidemia Continue Lipitor    Acquired Hypothyroidism Continue home levothyroxine .   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   Obstructive sleep apnea Patient was noncompliant with home CPAP Continue CPAP if she agrees  Interval History:  Resting comfortably in recliner this morning.  Oxygen  down to 2 L.   Antimicrobials:   Consultants:    Procedures:    DVT prophylaxis:  SCDs Start: 01/05/25 0533 apixaban  (ELIQUIS ) tablet 5 mg   Code Status:   Code Status: Full Code  Barriers to discharge:  Therapy evaluation: PT Orders: Active   PT Follow up Rec:   Disposition Plan: Home Status is: Inpatient  Mobility Assessment (Last 72 Hours)     Mobility Assessment     Row Name 01/06/25 1200 01/06/25 1134 01/05/25 2040 01/05/25 13:52:42 01/05/25 07:03:37   Does the patient have exclusion criteria? -- No- Perform mobility assessment No- Perform mobility assessment No- Perform mobility assessment No- Perform mobility assessment   What is the highest level of mobility based on the mobility assessment? Level 3 (Stands with assistance) - Balance while standing  and cannot march in place Level 4 (Ambulates with assistance) - Balance while stepping  forward/back - Complete Level 4 (Ambulates with assistance) - Balance while stepping forward/back - Complete Level 4 (Ambulates with assistance) - Balance while stepping forward/back - Complete Level 4 (Ambulates with assistance) - Balance while stepping forward/back - Complete      Diet: Diet Orders (From admission, onward)     Start     Ordered   01/05/25 0534  Diet heart healthy/carb modified Room service appropriate? Yes; Fluid consistency: Thin  Diet effective now       Question Answer Comment  Diet-HS Snack? Nothing   Room service appropriate? Yes   Fluid consistency: Thin      01/05/25 0535            Objective: Blood pressure (!) 93/50, pulse 69, temperature 97.6 F (36.4 C), temperature source Oral, resp. rate 19, height 5' 5 (1.651 m), weight (!) 155.7 kg, SpO2 97%.  Examination:  Physical Exam Constitutional:      Appearance: Normal appearance. She is obese.  HENT:     Head: Normocephalic and atraumatic.     Mouth/Throat:     Mouth: Mucous membranes are moist.  Eyes:     Extraocular Movements: Extraocular movements intact.  Cardiovascular:     Rate and Rhythm: Normal rate and regular rhythm.  Pulmonary:     Effort: Pulmonary effort is normal. No respiratory distress.     Breath sounds: Normal breath sounds. No wheezing.  Abdominal:     General: Bowel sounds are normal. There is no distension.     Palpations: Abdomen is soft.     Tenderness: There is no abdominal tenderness.  Musculoskeletal:        General: Normal range of motion.     Cervical back: Normal range of motion and neck supple.     Right lower leg: Edema present.     Left lower leg: Edema present.  Skin:    General: Skin is warm and dry.  Neurological:     General: No focal deficit present.     Mental Status: She is alert.  Psychiatric:        Mood and Affect: Mood normal.      Data Reviewed: Results for orders placed or performed during the hospital encounter of 01/04/25 (from the  past 24 hours)  Glucose, capillary     Status: Abnormal   Collection Time: 01/05/25  5:05 PM  Result Value Ref Range   Glucose-Capillary 132 (H) 70 - 99 mg/dL   Comment 1 Document in Chart   Glucose, capillary     Status: Abnormal   Collection Time: 01/05/25  8:34 PM  Result Value Ref Range   Glucose-Capillary 128 (H) 70 - 99 mg/dL   Comment 1 Notify RN    Comment 2 Document in Chart   Basic metabolic panel     Status: Abnormal   Collection Time: 01/06/25  3:41 AM  Result Value Ref Range   Sodium 144 135 - 145 mmol/L   Potassium 4.6 3.5 - 5.1 mmol/L   Chloride 106 98 - 111 mmol/L   CO2 27 22 - 32 mmol/L   Glucose, Bld 97 70 - 99 mg/dL   BUN 35 (H) 8 - 23 mg/dL   Creatinine, Ser 8.30 (H) 0.44 - 1.00 mg/dL   Calcium  7.6 (L) 8.9 - 10.3 mg/dL   GFR, Estimated 33 (L) >60 mL/min   Anion gap 11 5 - 15  CBC  with Differential/Platelet     Status: Abnormal   Collection Time: 01/06/25  3:41 AM  Result Value Ref Range   WBC 6.3 4.0 - 10.5 K/uL   RBC 3.66 (L) 3.87 - 5.11 MIL/uL   Hemoglobin 9.1 (L) 12.0 - 15.0 g/dL   HCT 67.5 (L) 63.9 - 53.9 %   MCV 88.5 80.0 - 100.0 fL   MCH 24.9 (L) 26.0 - 34.0 pg   MCHC 28.1 (L) 30.0 - 36.0 g/dL   RDW 83.1 (H) 88.4 - 84.4 %   Platelets 246 150 - 400 K/uL   nRBC 0.0 0.0 - 0.2 %   Neutrophils Relative % 54 %   Neutro Abs 3.4 1.7 - 7.7 K/uL   Lymphocytes Relative 35 %   Lymphs Abs 2.2 0.7 - 4.0 K/uL   Monocytes Relative 7 %   Monocytes Absolute 0.5 0.1 - 1.0 K/uL   Eosinophils Relative 4 %   Eosinophils Absolute 0.2 0.0 - 0.5 K/uL   Basophils Relative 0 %   Basophils Absolute 0.0 0.0 - 0.1 K/uL   Immature Granulocytes 0 %   Abs Immature Granulocytes 0.02 0.00 - 0.07 K/uL  Glucose, capillary     Status: Abnormal   Collection Time: 01/06/25  7:54 AM  Result Value Ref Range   Glucose-Capillary 117 (H) 70 - 99 mg/dL  Glucose, capillary     Status: Abnormal   Collection Time: 01/06/25 11:32 AM  Result Value Ref Range   Glucose-Capillary 112 (H)  70 - 99 mg/dL    I have reviewed pertinent nursing notes, vitals, labs, and images as necessary. I have ordered labwork to follow up on as indicated.  I have reviewed the last notes from staff over past 24 hours. I have discussed patient's care plan and test results with nursing staff, CM/SW, and other staff as appropriate.  Old records reviewed in assessment of this patient  Time spent: Greater than 50% of the 55 minute visit was spent in counseling/coordination of care for the patient as laid out in the A&P.   LOS: 1 day   Alm Apo, MD [facility] 01/06/2025, 2:32 PM "

## 2025-01-06 NOTE — Progress Notes (Signed)
 PT Cancellation Note  Patient Details Name: Maureen Jackson MRN: [id number] DOB: [birth year]   Cancelled Treatment:    Reason Eval/Treat Not Completed: Fatigue/lethargy limiting ability to participate. Patient declined therapy secondary to c/o nausea - nurse notified.   3:46 PM, 01/06/25 Lynwood Music, MPT Physical Therapist with [facility] [phone] office [phone] mobile phone

## 2025-01-06 NOTE — Hospital Course (Signed)
 Maureen Jackson is a 69 y.o. 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   She states that since returning home after being discharged <1 week ago, shortness of breath has progressively worsened despite being compliant with her medications.  Patient was recently admitted from 1/20 through 12/31/2024 due to acute on chronic combined systolic and diastolic CHF secondary to diuretics and diet noncompliance and was treated with IV Lasix .   ED course In the Emergency Department, she was hemodynamically stable.  Workup in the ED showed normocytic anemia, hypernatremia, troponin 66 (this was 75 on 1/20), proBNP 3,363 (this was 2,009). Chest x-ray showed moderate cardiomegaly with globular configuration of the cardiac silhouette, suggestive of underlying pericardial effusion. She was treated with IV Lasix  80 mg x 1, TRH was asked to admit patient   Assessment and Plan:  Acute on chronic combined systolic and diastolic CHF proBNP 3,363 Chest x-ray showed moderate cardiomegaly and pericardial effusion - Continue Lasix  - Continue strict I&O -Follow-up creatinine in the morning, if still increased, likely has been diuresed sufficiently and would discharge   Acute respiratory failure with hypoxia -Not on oxygen  at baseline required increased O2 demand on admission - Has been further weaned, continue weaning and needs eventual walk test prior to discharge   Elevated troponin possibly secondary to type II demand ischemia Chronic LBBB Ischemic cardiomyopathy CAD s/p PCI with stent placement Peripheral  artery disease -Elevated troponin on admission consider demand ischemia -Continue Eliquis , Lipitor , Entresto    Type 2 diabetes mellitus with hyperglycemia Hemoglobin A1c on 12/30/2024 was 7.2 Continue SSI and CBG monitoring   CKD 3A Creatinine 1.29 (baseline creatinine at 1.4-1.7) Renally adjust medications, avoid nephrotoxic agents/dehydration/hypotension   Generalized weakness Continue fall precaution Continue PT/OT eval and treat   Essential hypertension -Continue meds as noted above   Mixed hyperlipidemia Continue Lipitor    Acquired Hypothyroidism Continue home levothyroxine .   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   Obstructive sleep apnea Patient was noncompliant with home CPAP Continue CPAP if she agrees

## 2025-01-06 NOTE — Progress Notes (Signed)
 Pt asked multiple times throughout the day if she would like to get from the chair into the bed, patient states not right now I will later. I asked again after dinner, patient continued to say the same. When I asked if she had been up today from the chair she replied no, when asked if she would like to get up patient replied not right now.   Patient has complained of nausea, medication given x1, patient given option for more nausea medication if still complaining of nausea, patient declined at this time.

## 2025-01-07 DIAGNOSIS — N1832 Chronic kidney disease, stage 3b: Secondary | ICD-10-CM

## 2025-01-07 DIAGNOSIS — E782 Mixed hyperlipidemia: Secondary | ICD-10-CM

## 2025-01-07 DIAGNOSIS — N179 Acute kidney failure, unspecified: Secondary | ICD-10-CM

## 2025-01-07 LAB — CBC WITH DIFFERENTIAL/PLATELET
Abs Immature Granulocytes: 0.01 10*3/uL (ref 0.00–0.07)
Basophils Absolute: 0 10*3/uL (ref 0.0–0.1)
Basophils Relative: 0 %
Eosinophils Absolute: 0.3 10*3/uL (ref 0.0–0.5)
Eosinophils Relative: 5 %
HCT: 31 % — ABNORMAL LOW (ref 36.0–46.0)
Hemoglobin: 8.5 g/dL — ABNORMAL LOW (ref 12.0–15.0)
Immature Granulocytes: 0 %
Lymphocytes Relative: 33 %
Lymphs Abs: 1.7 10*3/uL (ref 0.7–4.0)
MCH: 24.4 pg — ABNORMAL LOW (ref 26.0–34.0)
MCHC: 27.4 g/dL — ABNORMAL LOW (ref 30.0–36.0)
MCV: 88.8 fL (ref 80.0–100.0)
Monocytes Absolute: 0.4 10*3/uL (ref 0.1–1.0)
Monocytes Relative: 7 %
Neutro Abs: 2.9 10*3/uL (ref 1.7–7.7)
Neutrophils Relative %: 55 %
Platelets: 221 10*3/uL (ref 150–400)
RBC: 3.49 MIL/uL — ABNORMAL LOW (ref 3.87–5.11)
RDW: 16.8 % — ABNORMAL HIGH (ref 11.5–15.5)
WBC: 5.2 10*3/uL (ref 4.0–10.5)
nRBC: 0 % (ref 0.0–0.2)

## 2025-01-07 LAB — BASIC METABOLIC PANEL WITH GFR
Anion gap: 14 (ref 5–15)
BUN: 46 mg/dL — ABNORMAL HIGH (ref 8–23)
CO2: 23 mmol/L (ref 22–32)
Calcium: 7.2 mg/dL — ABNORMAL LOW (ref 8.9–10.3)
Chloride: 105 mmol/L (ref 98–111)
Creatinine, Ser: 2.21 mg/dL — ABNORMAL HIGH (ref 0.44–1.00)
GFR, Estimated: 24 mL/min — ABNORMAL LOW
Glucose, Bld: 159 mg/dL — ABNORMAL HIGH (ref 70–99)
Potassium: 4.3 mmol/L (ref 3.5–5.1)
Sodium: 141 mmol/L (ref 135–145)

## 2025-01-07 LAB — GLUCOSE, CAPILLARY
Glucose-Capillary: 154 mg/dL — ABNORMAL HIGH (ref 70–99)
Glucose-Capillary: 125 mg/dL — ABNORMAL HIGH (ref 70–99)
Glucose-Capillary: 116 mg/dL — ABNORMAL HIGH (ref 70–99)
Glucose-Capillary: 174 mg/dL — ABNORMAL HIGH (ref 70–99)

## 2025-01-07 LAB — MAGNESIUM: Magnesium: 2.4 mg/dL (ref 1.7–2.4)

## 2025-01-07 MED ORDER — FUROSEMIDE 40 MG PO TABS
40.0000 mg | ORAL_TABLET | Freq: Two times a day (BID) | ORAL | Status: DC
  Administered 2025-01-07 – 2025-01-08 (×3): 40 mg via ORAL
  Filled 2025-01-07 (×3): qty 1

## 2025-01-07 NOTE — Progress Notes (Signed)
 " Progress Note   Patient: Maureen Jackson FMW:[id number] DOB: 05/01/1956 DOA: 01/04/2025     2 DOS: the patient was seen and examined on 01/07/2025   Brief hospital course: Maureen Jackson is a 69 y.o. 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   She states that since returning home after being discharged <1 week ago, shortness of breath has progressively worsened despite being compliant with her medications.  Patient was recently admitted from 1/20 through 12/31/2024 due to acute on chronic combined systolic and diastolic CHF secondary to diuretics and diet noncompliance and was treated with IV Lasix .  Assessment and Plan: Acute on chronic combined systolic and diastolic CHF proBNP 3,363 Chest x-ray showed moderate cardiomegaly and pericardial effusion Patient got IV Lasix  60 mg twice daily, creatinine today is elevated.  Will hold off on Lasix  today.  Hold Entresto . Continue to monitor daily weights, strict input and output. Continue to wean supplemental oxygen .   Acute respiratory failure with hypoxia In the setting of CHF exacerbation. Wean supplemental oxygen  as able to. He may need to be qualified for home O2.   Chronic LBBB Ischemic cardiomyopathy CAD s/p PCI with stent placement Peripheral artery disease Elevated troponin possibly secondary to type II demand ischemia Continue Eliquis , Lipitor .   Type 2 diabetes mellitus with hyperglycemia Hemoglobin A1c on 12/30/2024 was 7.2 Continue SSI and CBG monitoring   Acute on CKD 3B Creatinine today is 2.21 from baseline creatinine at  1.4-1.7. Renally adjust medications, avoid nephrotoxic agents/dehydration/hypotension.   Generalized weakness Continue fall precaution Continue PT/OT eval and treat   Essential hypertension BP lower side, caution with antihypertensive medications.   Mixed hyperlipidemia Continue Lipitor    Acquired Hypothyroidism Continue home levothyroxine .   Chronic embolism and thrombosis of right popliteal vein- Continue home Eliquis .   Obstructive sleep apnea Continue CPAP if she agrees. Advised compliance.  Morbid Obesity- BMI 57.56. Diet, exercise and weight reduction advised.     Out of bed to chair. Incentive spirometry. Nursing supportive care. Fall, aspiration precautions. Diet:  Diet Orders (From admission, onward)     Start     Ordered   01/05/25 0534  Diet heart healthy/carb modified Room service appropriate? Yes; Fluid consistency: Thin  Diet effective now       Question Answer Comment  Diet-HS Snack? Nothing   Room service appropriate? Yes   Fluid consistency: Thin      01/05/25 0535           DVT prophylaxis: SCDs Start: 01/05/25 0533 apixaban  (ELIQUIS ) tablet 5 mg  Level of care: Telemetry   Code Status: Full Code  Subjective: Patient is seen and examined today morning. She feels  Physical Exam: Vitals:   01/06/25 1439 01/06/25 2038 01/07/25 0553 01/07/25 1218  BP: 105/75 (!) 108/55 106/65 113/75  Pulse: 66 69 70 65  Resp:  19 19   Temp:  98.2 F (36.8 C) 98.3 F (36.8 C) (!) 97.5 F (36.4 C)  TempSrc:  Oral Oral Oral  SpO2:  97% 93% 99%  Weight:   (!) 156.9 kg   Height:        General - Elderly African American obese  female, no apparent distress HEENT - PERRLA, EOMI, atraumatic head, non tender sinuses. Lung - distant breath sounds, basal rales, diffuse rhonchi, no wheezes. Heart - S1, S2 heard, no murmurs, rubs, 1+ pedal edema. Abdomen - Soft, non tender, obese, bowel sounds distant Neuro - lethargic, arousable, non focal exam. Skin -  Warm and dry.  Data Reviewed:      Latest Ref Rng & Units 01/07/2025    4:18 AM 01/06/2025    3:41 AM 01/05/2025    5:56 AM  CBC  WBC 4.0 - 10.5 K/uL 5.2  6.3  7.7   Hemoglobin 12.0 - 15.0 g/dL 8.5  9.1  9.1   Hematocrit 36.0 - 46.0 % 31.0  32.4  32.2   Platelets 150 - 400 K/uL 221  246  241       Latest Ref Rng & Units 01/07/2025    4:18 AM 01/06/2025    3:41 AM 01/05/2025    5:56 AM  BMP  Glucose 70 - 99 mg/dL 840  97  875   BUN 8 - 23 mg/dL 46  35  26   Creatinine 0.44 - 1.00 mg/dL 7.78  8.30  8.54   Sodium 135 - 145 mmol/L 141  144  148   Potassium 3.5 - 5.1 mmol/L 4.3  4.6  4.4   Chloride 98 - 111 mmol/L 105  106  109   CO2 22 - 32 mmol/L 23  27  28    Calcium  8.9 - 10.3 mg/dL 7.2  7.6  7.7    No results found.  Family Communication: Discussed with patient, understand and agree. All questions answered.  Disposition: Status is: Inpatient Remains inpatient appropriate because:   Planned Discharge Destination: Home with Home Health     Time spent: 52 minutes  Author: Concepcion Riser, MD 01/07/2025 3:47 PM Secure chat 7am to 7pm For on call review [email or web address].    "

## 2025-01-07 NOTE — TOC Progression Note (Signed)
 Transition of Care [facility]) - Progression Note    Patient Details  Name: Maureen Jackson MRN: [id number] Date of Birth: 08/18/1956  [facility] CM/SW Contact  [name], [place] Phone Number: 01/07/2025, 3:18 PM  Clinical Narrative:     CSW spoke with patient at bedside regarding PT recommendation for SNF. Patient declined stating,  I will do it at my house . Patient did confirm still having her aide that comes out Monday , Tuesday, Thursday, and Friday for 5 hours. CSW did send HHPT referral in HUB. Patient has had [facility] and [facility] before. Will continue to follow.   Expected Discharge Plan: Home w Home Health Services Barriers to Discharge: Continued Medical Work up               Expected Discharge Plan and Services In-house Referral: Clinical Social Work Discharge Planning Services: CM Consult Post Acute Care Choice: Skilled Nursing Facility, Home Health Living arrangements for the past 2 months: Apartment                           HH Arranged: Refused SNF           Social Drivers of Health (SDOH) Interventions SDOH Screenings   Food Insecurity: No Food Insecurity (01/05/2025)  Housing: Low Risk (01/05/2025)  Transportation Needs: No Transportation Needs (01/05/2025)  Utilities: Not At Risk (01/05/2025)  Alcohol  Screen: Low Risk (12/22/2022)  Financial Resource Strain: Low Risk (05/19/2024)  Social Connections: Moderately Integrated (01/05/2025)  Tobacco Use: Medium Risk (01/04/2025)    Readmission Risk Interventions    01/07/2025    3:08 PM 01/05/2025    2:13 PM 09/19/2024    3:34 PM  Readmission Risk Prevention Plan  Transportation Screening Complete Complete Complete  HRI or Home Care Consult   Complete  Social Work Consult for Recovery Care Planning/Counseling   Complete  Palliative Care Screening   Not Applicable  Medication Review [employer]) Complete Complete Complete  PCP or Specialist appointment within 3-5 days of  discharge  Complete   HRI or Home Care Consult Complete Complete   SW Recovery Care/Counseling Consult Complete Complete   Palliative Care Screening Not Applicable Not Applicable   Skilled Nursing Facility Patient Refused

## 2025-01-07 NOTE — Evaluation (Signed)
 Physical Therapy Evaluation Patient Details Name: Maureen Jackson MRN: [id number] DOB: January 02, 1956 Today's Date: 01/07/2025  History of Present Illness  Maureen Jackson is a 69 y.o. 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  She states that since returning home after being discharged 5 days ago, shortness of breath has progressively worsened despite being compliant with her medications.  Today, shortness of breath worsened to the extent that she had difficulty in being able to ambulate to the kitchen to take her medications.  She endorsed dry cough, but denies fever.   Clinical Impression  Patient was agreeable to PT evaluation. Patient was slightly lethargic at start of session and reporting she didn't feel well but unable to explain exactly what was wrong. Her alertness improved with mobility. Patient required min/mod assist for transfers and bed mobility and was very limited with ambulation to only side steps at bedside with RW. Patient returns to bed at EOS. Required 2+ assist to boost up into bed from nursing staff. Bed alarm was set and call button was in reach. Patient will benefit from continued skilled physical therapy acutely and in recommended venue in order to address current deficits and improve overall function.         If plan is discharge home, recommend the following: A lot of help with walking and/or transfers;A little help with bathing/dressing/bathroom;Assistance with cooking/housework;Assist for transportation;Help with stairs or ramp for entrance   Can travel by private vehicle         Equipment Recommendations Rolling walker (2 wheels)  Recommendations for Other Services       Functional Status Assessment Patient has had a recent decline in their functional status and demonstrates the ability to make significant improvements in function in a reasonable and predictable amount of time.     Precautions / Restrictions Precautions Precautions: Fall Recall of Precautions/Restrictions: Intact Restrictions Weight Bearing Restrictions Per Provider Order: No      Mobility  Bed Mobility Overal bed mobility: Needs Assistance Bed Mobility: Sit to Supine       Sit to supine: Min assist, Mod assist   General bed mobility comments: Mod assist required for assisting LE into bed due to LE weakness, HOB slightly elevated, pt uses bed railing to assist, labored movement    Transfers Overall transfer level: Needs assistance Equipment used: Rolling walker (2 wheels) Transfers: Sit to/from Stand, Bed to chair/wheelchair/BSC Sit to Stand: Min assist, Mod assist   Step pivot transfers: Min assist       General transfer comment: STS from chair with RW and min/mod assist due to LE weakness, pt demo very slow labored movement throughout and is very fatigued, min assist during transfer for steadying assistance.    Ambulation/Gait Ambulation/Gait assistance: Min assist Gait Distance (Feet): 3 Feet Assistive device: Rolling walker (2 wheels) Gait Pattern/deviations: Step-to pattern, Decreased step length - right, Decreased step length - left, Decreased stride length, Trunk flexed Gait velocity: Dec     General Gait Details: min assist for steadying during side steps at bedside with RW, pt demo very labored movement, limited by fatigue and weakness  Stairs  Wheelchair Mobility     Tilt Bed    Modified Rankin (Stroke Patients Only)       Balance Overall balance assessment: Needs assistance Sitting-balance support: No upper extremity  supported, Feet supported Sitting balance-Leahy Scale: Good Sitting balance - Comments: seated Edge of chair and bed   Standing balance support: Reliant on assistive device for balance, Bilateral upper extremity supported, During functional activity Standing balance-Leahy Scale: Fair Standing balance comment: with RW             Pertinent Vitals/Pain Pain Assessment Pain Assessment: Faces Faces Pain Scale: Hurts even more Pain Location: Just not feeling well Pain Descriptors / Indicators: Aching, Constant, Discomfort, Guarding, Grimacing Pain Intervention(s): Limited activity within patient's tolerance, Repositioned    Home Living Family/patient expects to be discharged to:: Private residence Living Arrangements: Alone Available Help at Discharge: Personal care attendant Type of Home: Apartment Home Access: Level entry       Home Layout: One level Home Equipment: Grab bars - tub/shower;Cane - single point;Rollator (4 wheels) Additional Comments: pt reports no change in home set up since last admission, PCA from 12 to 5PM, 4 days a week.    Prior Function Prior Level of Function : Needs assist;History of Falls (last six months)       Physical Assist : ADLs (physical);Mobility (physical) Mobility (physical): Bed mobility;Transfers;Gait;Stairs ADLs (physical): Dressing;Bathing;IADLs;Toileting Mobility Comments: Reports she continues to be a household ambulator with Rollator and SPC. Using [facility] more often because rollator does not lock. ADLs Comments: Pt reports PCA 4 days/week for ADLs and iADLs; pt reports getting a lot of assist for bathing, dressing, and toileting.     Extremity/Trunk Assessment   Upper Extremity Assessment Upper Extremity Assessment: Defer to OT evaluation    Lower Extremity Assessment Lower Extremity Assessment: Generalized weakness    Cervical / Trunk Assessment Cervical / Trunk Assessment: Kyphotic  Communication    Communication Communication: No apparent difficulties    Cognition Arousal: Lethargic Behavior During Therapy: WFL for tasks assessed/performed         Following commands: Intact       Cueing Cueing Techniques: Verbal cues, Visual cues     General Comments      Exercises     Assessment/Plan    PT Assessment Patient needs continued PT services;All further PT needs can be met in the next venue of care  PT Problem List Decreased strength;Decreased activity tolerance;Decreased balance;Decreased mobility       PT Treatment Interventions DME instruction;Balance training;Gait training;Functional mobility training;Therapeutic activities;Therapeutic exercise;Patient/family education    PT Goals (Current goals can be found in the Care Plan section)  Acute Rehab PT Goals Patient Stated Goal: Return home following rehab PT Goal Formulation: With patient Time For Goal Achievement: 01/21/25 Potential to Achieve Goals: Good    Frequency Min 3X/week     Co-evaluation               AM-PAC PT 6 Clicks Mobility  Outcome Measure Help needed turning from your back to your side while in a flat bed without using bedrails?: A Little Help needed moving from lying on your back to sitting on the side of a flat bed without using bedrails?: A Lot Help needed moving to and from a bed to a chair (including a wheelchair)?: A Little Help needed standing up from a chair using your arms (e.g., wheelchair or bedside chair)?: A Little Help needed to walk in hospital room?: A Little Help needed climbing 3-5 steps  with a railing? : A Lot 6 Click Score: 16    End of Session Equipment Utilized During Treatment: Gait belt Activity Tolerance: Patient tolerated treatment well;Patient limited by fatigue Patient left: in bed;with call bell/phone within reach;with bed alarm set Nurse Communication: Mobility status PT Visit Diagnosis: Other abnormalities of gait and mobility (R26.89);Muscle  weakness (generalized) (M62.81);Difficulty in walking, not elsewhere classified (R26.2)    Time: 1047-1100 PT Time Calculation (min) (ACUTE ONLY): 13 min   Charges:   PT Evaluation $PT Eval Low Complexity: 1 Low   PT General Charges $$ ACUTE PT VISIT: 1 Visit        3:01 PM, 01/07/25 Daaiel Starlin Powell-Butler, PT, DPT [place] with [facility]

## 2025-01-07 NOTE — Progress Notes (Signed)
 Heart Failure Navigator Progress Note Advanced Heart Failure Team patient.  She originally had a scheduled follow-up on 01/09/25. Rescheduled for 01/16/25 @ 2:30 [facility] because she is hospitalized right now @ [facility].  Included new appointment details on her AVS.  Navigator will sign off at this time.  Charmaine Pines, RN, BSN [facility] Heart Failure Navigator Secure Chat Only

## 2025-01-07 NOTE — Plan of Care (Signed)
" °  Problem: Acute Rehab PT Goals(only PT should resolve) Goal: Pt Will Go Supine/Side To Sit Outcome: Progressing Flowsheets (Taken 01/07/2025 1502) Pt will go Supine/Side to Sit: with contact guard assist Goal: Patient Will Transfer Sit To/From Stand Outcome: Progressing Flowsheets (Taken 01/07/2025 1502) Patient will transfer sit to/from stand: with contact guard assist Goal: Pt Will Transfer Bed To Chair/Chair To Bed Outcome: Progressing Flowsheets (Taken 01/07/2025 1502) Pt will Transfer Bed to Chair/Chair to Bed: with supervision Goal: Pt Will Ambulate Outcome: Progressing Flowsheets (Taken 01/07/2025 1502) Pt will Ambulate:  with rolling walker  with contact guard assist  15 feet    3:03 PM, 01/07/25 Clester Chlebowski Powell-Butler, PT, DPT [place] with [facility]  "

## 2025-01-08 LAB — GLUCOSE, CAPILLARY
Glucose-Capillary: 125 mg/dL — ABNORMAL HIGH (ref 70–99)
Glucose-Capillary: 156 mg/dL — ABNORMAL HIGH (ref 70–99)
Glucose-Capillary: 153 mg/dL — ABNORMAL HIGH (ref 70–99)

## 2025-01-08 LAB — HEMOGLOBIN AND HEMATOCRIT, BLOOD
HCT: 29.5 % — ABNORMAL LOW (ref 36.0–46.0)
Hemoglobin: 8.3 g/dL — ABNORMAL LOW (ref 12.0–15.0)

## 2025-01-08 LAB — CREATININE, SERUM
Creatinine, Ser: 2.09 mg/dL — ABNORMAL HIGH (ref 0.44–1.00)
GFR, Estimated: 26 mL/min — ABNORMAL LOW

## 2025-01-08 NOTE — Plan of Care (Signed)
" °  Problem: Skin Integrity: Goal: Risk for impaired skin integrity will decrease Outcome: Progressing   Problem: Clinical Measurements: Goal: Respiratory complications will improve Outcome: Progressing   Problem: Activity: Goal: Risk for activity intolerance will decrease Outcome: Progressing   Problem: Coping: Goal: Level of anxiety will decrease Outcome: Progressing   Problem: Pain Managment: Goal: General experience of comfort will improve and/or be controlled Outcome: Progressing   Problem: Activity: Goal: Risk for activity intolerance will decrease Outcome: Progressing   Problem: Safety: Goal: Ability to remain free from injury will improve Outcome: Progressing   Problem: Skin Integrity: Goal: Risk for impaired skin integrity will decrease Outcome: Progressing   "

## 2025-01-08 NOTE — TOC Transition Note (Signed)
 Transition of Care [facility]) - Discharge Note   Patient Details  Name: Maureen Jackson MRN: [id number] Date of Birth: 09-19-1956  [facility] CM/SW Contact:  [name], LCSWA Phone Number: 01/08/2025, 2:49 PM   Clinical Narrative:     CSW spoke with patient at bedside about HHPT agency options since she is expected to DC today. Patient states that the MD did not give her a defiant answer about her DC today after she told him about her yard. Patient shared that she would need to go by EMS since her yard looks bad with the ice and snow. Patient was prior active with [facility] since they had not came out yet to start services, accepted back in HUB. ICM signing off.   Final next level of care: Home w Home Health Services Barriers to Discharge: Barriers Resolved   Patient Goals and CMS Choice Patient states their goals for this hospitalization and ongoing recovery are:: get better CMS Medicare.gov Compare Post Acute Care list provided to:: Patient Choice offered to / list presented to : Patient      Discharge Placement                Patient to be transferred to facility by: [facility] Name of family member notified: Patient Patient and family notified of of transfer: 01/08/25  Discharge Plan and Services Additional resources added to the After Visit Summary for   In-house Referral: Clinical Social Work Discharge Planning Services: CM Consult Post Acute Care Choice: Skilled Nursing Facility, Home Health          DME Arranged: N/A DME Agency: NA       HH Arranged: PT HH Agency: [facility] Date HH Agency Contacted: 01/08/25 Time HH Agency Contacted: 1447 Representative spoke with at [facility] Agency: [place]  Social Drivers of Health (SDOH) Interventions SDOH Screenings   Food Insecurity: No Food Insecurity (01/05/2025)  Housing: Low Risk (01/05/2025)  Transportation Needs: No Transportation Needs (01/05/2025)  Utilities: Not At Risk (01/05/2025)   Alcohol  Screen: Low Risk (12/22/2022)  Financial Resource Strain: Low Risk (05/19/2024)  Social Connections: Moderately Integrated (01/05/2025)  Tobacco Use: Medium Risk (01/04/2025)     Readmission Risk Interventions    01/08/2025    2:46 PM 01/07/2025    3:08 PM 01/05/2025    2:13 PM  Readmission Risk Prevention Plan  Transportation Screening Complete Complete Complete  Medication Review Oceanographer) Complete Complete Complete  PCP or Specialist appointment within 3-5 days of discharge   Complete  HRI or Home Care Consult Complete Complete Complete  SW Recovery Care/Counseling Consult Complete Complete Complete  Palliative Care Screening Not Applicable Not Applicable Not Applicable  Skilled Nursing Facility Patient Refused Patient Refused

## 2025-01-08 NOTE — Care Management Important Message (Signed)
 Important Message  Patient Details  Name: Maureen Jackson MRN: [id number] Date of Birth: 04/08/1956   Important Message Given:  Yes - Medicare IM     Chayanne Filippi L Erron Wengert 01/08/2025, 4:45 PM

## 2025-01-08 NOTE — Progress Notes (Signed)
 Mobility Specialist Progress Note:    01/08/25 1530  Mobility  Activity Ambulated with assistance  Level of Assistance Contact guard assist, steadying assist  Assistive Device Front wheel walker  Distance Ambulated (ft) 45 ft  Range of Motion/Exercises Active;All extremities  Activity Response Tolerated well  Mobility Referral Yes  Mobility visit 1 Mobility  Mobility Specialist Start Time (ACUTE ONLY) 1530  Mobility Specialist Stop Time (ACUTE ONLY) 1550  Mobility Specialist Time Calculation (min) (ACUTE ONLY) 20 min   Pt received in bed, agreeable to mobility. Required CGA to stand and ambulate with RW. Tolerated well, SpO2 96% on RA at rest. SpO2 92% on RA during ambulation. Left in chair, call bell in reach. All needs met.  Hafsa Lohn Mobility Specialist Please contact via Special Educational Needs Teacher or  Rehab office at [phone]

## 2025-01-09 ENCOUNTER — Telehealth: Payer: Self-pay

## 2025-01-09 ENCOUNTER — Ambulatory Visit (HOSPITAL_COMMUNITY)

## 2025-01-09 NOTE — Transitions of Care (Post Inpatient/ED Visit) (Signed)
" ° °  01/09/2025  Name: Maureen Jackson MRN: [id number] DOB: [birth year]  Today's TOC FU Call Status: Today's TOC FU Call Status:: Unsuccessful Call (1st Attempt) Unsuccessful Call (1st Attempt) Date: 01/09/25  Attempted to reach the patient regarding the most recent Inpatient/ED visit.  Follow Up Plan: Additional outreach attempts will be made to reach the patient to complete the Transitions of Care (Post Inpatient/ED visit) call.   Almarie Shams, RN [facility] [facility] RN Care Manager / Transition of Care Direct Dial: [phone]  "

## 2025-01-12 ENCOUNTER — Telehealth: Payer: Self-pay

## 2025-01-14 ENCOUNTER — Telehealth: Payer: Self-pay

## 2025-01-14 NOTE — Transitions of Care (Post Inpatient/ED Visit) (Signed)
" ° °  01/14/2025  Name: Maureen Jackson MRN: [id number] DOB: February 24, 1956  Today's TOC FU Call Status: Today's TOC FU Call Status:: Unsuccessful Call (3rd Attempt) Unsuccessful Call (3rd Attempt) Date: 01/14/25  Attempted to reach the patient regarding the most recent Inpatient/ED visit.  Follow Up Plan: No further outreach attempts will be made at this time. We have been unable to contact the patient.  Marijah Larranaga J. Patrycja Mumpower RN, MSN [facility] RN Care Manager Direct Dial: [phone]  Fax: [phone] Website: [email or web address]   "

## 2025-01-16 ENCOUNTER — Ambulatory Visit (HOSPITAL_COMMUNITY)

## 2025-01-20 ENCOUNTER — Ambulatory Visit (HOSPITAL_COMMUNITY)
